# Patient Record
Sex: Male | Born: 1935
Health system: Southern US, Community
[De-identification: ages and names within clinical notes are randomized; demographics above are authoritative.]

## PROBLEM LIST (undated history)

## (undated) DIAGNOSIS — M542 Cervicalgia: Secondary | ICD-10-CM

## (undated) DIAGNOSIS — E7219 Other disorders of sulfur-bearing amino-acid metabolism: Secondary | ICD-10-CM

## (undated) DIAGNOSIS — R9431 Abnormal electrocardiogram [ECG] [EKG]: Secondary | ICD-10-CM

## (undated) DIAGNOSIS — I714 Abdominal aortic aneurysm, without rupture, unspecified: Secondary | ICD-10-CM

## (undated) DIAGNOSIS — M419 Scoliosis, unspecified: Secondary | ICD-10-CM

## (undated) DIAGNOSIS — D126 Benign neoplasm of colon, unspecified: Secondary | ICD-10-CM

## (undated) DIAGNOSIS — H8093 Unspecified otosclerosis, bilateral: Secondary | ICD-10-CM

## (undated) DIAGNOSIS — N529 Male erectile dysfunction, unspecified: Secondary | ICD-10-CM

## (undated) DIAGNOSIS — E785 Hyperlipidemia, unspecified: Secondary | ICD-10-CM

## (undated) DIAGNOSIS — IMO0002 Reserved for concepts with insufficient information to code with codable children: Secondary | ICD-10-CM

## (undated) DIAGNOSIS — K648 Other hemorrhoids: Secondary | ICD-10-CM

## (undated) DIAGNOSIS — M722 Plantar fascial fibromatosis: Secondary | ICD-10-CM

## (undated) DIAGNOSIS — M353 Polymyalgia rheumatica: Secondary | ICD-10-CM

## (undated) DIAGNOSIS — G5600 Carpal tunnel syndrome, unspecified upper limb: Secondary | ICD-10-CM

## (undated) DIAGNOSIS — M25519 Pain in unspecified shoulder: Secondary | ICD-10-CM

## (undated) DIAGNOSIS — M75102 Unspecified rotator cuff tear or rupture of left shoulder, not specified as traumatic: Secondary | ICD-10-CM

## (undated) DIAGNOSIS — J309 Allergic rhinitis, unspecified: Secondary | ICD-10-CM

## (undated) DIAGNOSIS — R972 Elevated prostate specific antigen [PSA]: Secondary | ICD-10-CM

## (undated) DIAGNOSIS — I739 Peripheral vascular disease, unspecified: Secondary | ICD-10-CM

## (undated) DIAGNOSIS — T7840XA Allergy, unspecified, initial encounter: Secondary | ICD-10-CM

## (undated) DIAGNOSIS — H9190 Unspecified hearing loss, unspecified ear: Secondary | ICD-10-CM

## (undated) DIAGNOSIS — H269 Unspecified cataract: Secondary | ICD-10-CM

## (undated) HISTORY — DX: Plantar fascial fibromatosis: M72.2

## (undated) HISTORY — DX: Other disorders of sulfur-bearing amino-acid metabolism: E72.19

## (undated) HISTORY — PX: COLONOSCOPY: SHX174

## (undated) HISTORY — DX: Abdominal aortic aneurysm, without rupture: I71.4

## (undated) HISTORY — PX: COLONOSCOPY W/ POLYPECTOMY: SHX1380

## (undated) HISTORY — DX: Peripheral vascular disease, unspecified: I73.9

## (undated) HISTORY — DX: Elevated prostate specific antigen (PSA): R97.20

## (undated) HISTORY — DX: Pain in unspecified shoulder: M25.519

## (undated) HISTORY — DX: Abdominal aortic aneurysm, without rupture, unspecified: I71.40

## (undated) HISTORY — DX: Unspecified otosclerosis, bilateral: H80.93

## (undated) HISTORY — DX: Allergy, unspecified, initial encounter: T78.40XA

## (undated) HISTORY — DX: Reserved for concepts with insufficient information to code with codable children: IMO0002

## (undated) HISTORY — DX: Abnormal electrocardiogram (ECG) (EKG): R94.31

## (undated) HISTORY — DX: Cervicalgia: M54.2

## (undated) HISTORY — DX: Carpal tunnel syndrome, unspecified upper limb: G56.00

## (undated) HISTORY — DX: Male erectile dysfunction, unspecified: N52.9

## (undated) HISTORY — DX: Scoliosis, unspecified: M41.9

## (undated) HISTORY — DX: Unspecified cataract: H26.9

## (undated) HISTORY — DX: Hyperlipidemia, unspecified: E78.5

## (undated) HISTORY — PX: STAPEDECTOMY: SHX2435

## (undated) HISTORY — DX: Unspecified rotator cuff tear or rupture of left shoulder, not specified as traumatic: M75.102

## (undated) HISTORY — DX: Unspecified hearing loss, unspecified ear: H91.90

## (undated) HISTORY — DX: Polymyalgia rheumatica: M35.3

## (undated) HISTORY — DX: Allergic rhinitis, unspecified: J30.9

## (undated) HISTORY — DX: Benign neoplasm of colon, unspecified: D12.6

## (undated) HISTORY — DX: Other hemorrhoids: K64.8

---

## 1999-11-29 HISTORY — PX: OTHER SURGICAL HISTORY: SHX169

## 1999-12-04 ENCOUNTER — Ambulatory Visit (HOSPITAL_COMMUNITY): Admission: RE | Admit: 1999-12-04 | Discharge: 1999-12-04 | Payer: Self-pay | Admitting: *Deleted

## 1999-12-05 ENCOUNTER — Encounter: Payer: Self-pay | Admitting: *Deleted

## 2001-01-26 ENCOUNTER — Observation Stay (HOSPITAL_COMMUNITY): Admission: RE | Admit: 2001-01-26 | Discharge: 2001-01-27 | Payer: Self-pay | Admitting: Urology

## 2002-04-30 ENCOUNTER — Encounter: Payer: Self-pay | Admitting: Emergency Medicine

## 2002-04-30 ENCOUNTER — Emergency Department (HOSPITAL_COMMUNITY): Admission: EM | Admit: 2002-04-30 | Discharge: 2002-04-30 | Payer: Self-pay | Admitting: Emergency Medicine

## 2003-01-06 ENCOUNTER — Encounter: Payer: Self-pay | Admitting: Emergency Medicine

## 2003-01-06 ENCOUNTER — Inpatient Hospital Stay (HOSPITAL_COMMUNITY): Admission: EM | Admit: 2003-01-06 | Discharge: 2003-01-07 | Payer: Self-pay | Admitting: Emergency Medicine

## 2003-01-07 ENCOUNTER — Encounter (INDEPENDENT_AMBULATORY_CARE_PROVIDER_SITE_OTHER): Payer: Self-pay | Admitting: Cardiology

## 2003-01-14 ENCOUNTER — Encounter (INDEPENDENT_AMBULATORY_CARE_PROVIDER_SITE_OTHER): Payer: Self-pay | Admitting: *Deleted

## 2003-01-14 ENCOUNTER — Encounter (INDEPENDENT_AMBULATORY_CARE_PROVIDER_SITE_OTHER): Payer: Self-pay | Admitting: Specialist

## 2003-01-14 ENCOUNTER — Ambulatory Visit (HOSPITAL_COMMUNITY): Admission: RE | Admit: 2003-01-14 | Discharge: 2003-01-14 | Payer: Self-pay | Admitting: *Deleted

## 2003-03-27 ENCOUNTER — Encounter (INDEPENDENT_AMBULATORY_CARE_PROVIDER_SITE_OTHER): Payer: Self-pay | Admitting: *Deleted

## 2003-03-27 ENCOUNTER — Ambulatory Visit (HOSPITAL_COMMUNITY): Admission: RE | Admit: 2003-03-27 | Discharge: 2003-03-27 | Payer: Self-pay | Admitting: *Deleted

## 2003-10-28 ENCOUNTER — Encounter (INDEPENDENT_AMBULATORY_CARE_PROVIDER_SITE_OTHER): Payer: Self-pay | Admitting: *Deleted

## 2003-10-28 ENCOUNTER — Encounter (INDEPENDENT_AMBULATORY_CARE_PROVIDER_SITE_OTHER): Payer: Self-pay | Admitting: Specialist

## 2003-10-28 ENCOUNTER — Ambulatory Visit (HOSPITAL_COMMUNITY): Admission: RE | Admit: 2003-10-28 | Discharge: 2003-10-28 | Payer: Self-pay | Admitting: *Deleted

## 2003-12-30 HISTORY — PX: OTHER SURGICAL HISTORY: SHX169

## 2004-01-08 ENCOUNTER — Encounter (INDEPENDENT_AMBULATORY_CARE_PROVIDER_SITE_OTHER): Payer: Self-pay | Admitting: *Deleted

## 2004-01-08 ENCOUNTER — Encounter (INDEPENDENT_AMBULATORY_CARE_PROVIDER_SITE_OTHER): Payer: Self-pay | Admitting: Specialist

## 2004-01-08 ENCOUNTER — Ambulatory Visit (HOSPITAL_COMMUNITY): Admission: RE | Admit: 2004-01-08 | Discharge: 2004-01-08 | Payer: Self-pay | Admitting: *Deleted

## 2004-01-26 ENCOUNTER — Encounter (INDEPENDENT_AMBULATORY_CARE_PROVIDER_SITE_OTHER): Payer: Self-pay | Admitting: Specialist

## 2004-01-26 ENCOUNTER — Inpatient Hospital Stay (HOSPITAL_COMMUNITY): Admission: RE | Admit: 2004-01-26 | Discharge: 2004-01-28 | Payer: Self-pay | Admitting: General Surgery

## 2004-01-26 ENCOUNTER — Encounter (INDEPENDENT_AMBULATORY_CARE_PROVIDER_SITE_OTHER): Payer: Self-pay | Admitting: *Deleted

## 2006-01-24 ENCOUNTER — Ambulatory Visit (HOSPITAL_COMMUNITY): Admission: RE | Admit: 2006-01-24 | Discharge: 2006-01-24 | Payer: Self-pay | Admitting: *Deleted

## 2006-01-24 ENCOUNTER — Encounter (INDEPENDENT_AMBULATORY_CARE_PROVIDER_SITE_OTHER): Payer: Self-pay | Admitting: *Deleted

## 2009-11-30 ENCOUNTER — Ambulatory Visit: Payer: Self-pay | Admitting: Vascular Surgery

## 2010-03-16 ENCOUNTER — Ambulatory Visit: Payer: Self-pay | Admitting: Internal Medicine

## 2010-03-16 DIAGNOSIS — Z860100 Personal history of colon polyps, unspecified: Secondary | ICD-10-CM | POA: Insufficient documentation

## 2010-04-13 ENCOUNTER — Telehealth (INDEPENDENT_AMBULATORY_CARE_PROVIDER_SITE_OTHER): Payer: Self-pay | Admitting: *Deleted

## 2010-04-14 ENCOUNTER — Ambulatory Visit: Payer: Self-pay | Admitting: Internal Medicine

## 2010-04-14 DIAGNOSIS — D126 Benign neoplasm of colon, unspecified: Secondary | ICD-10-CM

## 2010-04-14 HISTORY — DX: Benign neoplasm of colon, unspecified: D12.6

## 2010-04-14 LAB — HM COLONOSCOPY

## 2010-04-16 ENCOUNTER — Encounter: Payer: Self-pay | Admitting: Internal Medicine

## 2010-06-21 HISTORY — PX: OTHER SURGICAL HISTORY: SHX169

## 2010-06-29 ENCOUNTER — Encounter: Payer: Self-pay | Admitting: Internal Medicine

## 2010-12-07 ENCOUNTER — Ambulatory Visit: Admit: 2010-12-07 | Payer: Self-pay | Admitting: Vascular Surgery

## 2010-12-07 ENCOUNTER — Ambulatory Visit
Admission: RE | Admit: 2010-12-07 | Discharge: 2010-12-07 | Payer: Self-pay | Source: Home / Self Care | Attending: Vascular Surgery | Admitting: Vascular Surgery

## 2010-12-28 NOTE — Op Note (Signed)
Summary: Colonoscopy-Dr. Virginia Rochester  NAME:  [name], [name]                          ACCOUNT NO.:  [id number]   MEDICAL RECORD NO.:  [id number]                   PATIENT TYPE:  AMB   LOCATION:  ENDO                                 FACILITY:  [facility]   PHYSICIAN:  Georgiana Spinner, M.D.                 DATE OF BIRTH:  05/06/1930   DATE OF PROCEDURE:  01/08/2004  DATE OF DISCHARGE:                                 OPERATIVE REPORT   PROCEDURE PERFORMED:  Colonoscopy with biopsy.   ENDOSCOPIST:  Georgiana Spinner, M.D.   INDICATIONS FOR PROCEDURE:  Colon polyp, rectal bleed.   ANESTHESIA:  Demerol 70 mg, Versed 7 mg.   DESCRIPTION OF PROCEDURE:  With the patient mildly sedated in the left  lateral decubitus position, the Olympus videoscopic colonoscope was inserted  in the rectum and passed under direct vision to the cecum, identified by the  ileocecal valve and appendiceal orifice, both of which were photographed.  It was seen that there was polypoid tissue that had recurred once again at  the area of the appendix and this was photographed and biopsied.  The  colonoscope was slowly withdrawn taking circumferential views of the entire  colonic mucosa stopping only in the rectum which appeared normal on direct  and showed hemorrhoids on retroflex view.  The endoscope was straightened  and withdrawn.  The patient's vital signs and pulse oximeter remained  stable.  The patient tolerated the procedure well without apparent  complications.   FINDINGS:  Polyp of the appendiceal area, biopsied.   PLAN:  Since we have tried to eradicate this twice, I think really we need  to do some different form of therapy such as cecectomy and will discuss with  surgery and see if this could be done easily laparoscopically and get good  margins.                                               Georgiana Spinner, M.D.    GMO/MEDQ  D:  01/08/2004  T:  01/08/2004  Job:  [id number]   cc:   Lenon Curt. Chilton Si, M.D.  [address]  [place]  [place] [address]  Fax: [phone]

## 2010-12-28 NOTE — Op Note (Signed)
Summary: Colonoscopy-Dr. Virginia Rochester   NAME:  [name], [name]                          ACCOUNT NO.:  [id number]   MEDICAL RECORD NO.:  [id number]                   PATIENT TYPE:  INP   LOCATION:  0005                                 FACILITY:  [facility]   PHYSICIAN:  Angelia Mould. Derrell Lolling, M.D.             DATE OF BIRTH:  1930/10/17   DATE OF PROCEDURE:  01/26/2004  DATE OF DISCHARGE:                                 OPERATIVE REPORT   PREOPERATIVE DIAGNOSIS:  Recurrent villous adenoma of the cecum.   POSTOPERATIVE DIAGNOSIS:  Recurrent villous adenoma of the cecum.   OPERATION/PROCEDURE:  Laparoscopic resection of the appendix and cecal tip.   SURGEON:  Angelia Mould. Derrell Lolling, M.D.   FIRST ASSISTANT:  Jimmye Norman, M.D.   OPERATIVE INDICATIONS:  This is a 77 year old white male who has been  followed by Dr. Sabino Gasser for about two years.  He has had several  colonoscopies and at each colonoscopy, he has described a carpet-like polyp  closely associated with the appendiceal orifice.  Biopsies have always  showed adenomatous polyps but no dysplasia.  This has been ablated using the  argon photocoagulator.  The most recent colonoscopy showed the polypoid  tissue had recurred.  Dr. Virginia Rochester referred him to me for consideration of a  local resection to prevent transformation of this polyp into cancer.  The  patient has undergone a bowel prep at home and is brought to the hospital  electively.   OPERATIVE FINDINGS:  Laparoscopic survey of the liver, stomach, omentum,  small and large intestines were grossly normal.  The peritoneal surfaces  looked normal.  There was no ascites.  The appendix looked normal.  After  resecting the appendix and about a 2.5 cm rim of cecum, the specimen was  opened.  At the base of the appendix, there was some discolored tissue,  consistent with the recurrent polyp, but there was no palpable mass or  obvious cancer.  The margins looked excellent.   OPERATIVE TECHNIQUE:  Following  the induction of general endotracheal  anesthesia, a Foley catheter and orogastric tube were placed.  The patient's  abdomen was prepped and draped in a sterile fashion.  Marcaine 0.5% was used  as a local infiltrate and anesthetic.  An 11 mm Optiview port was inserted  in the left mid abdomen and the lateral aspect of the rectus muscle just  above the umbilicus.  A 0 degree camera was used as a guide to traverse the  abdominal wall and the infra-abdominal cavity without any difficulty.  The  abdomen was insufflated to 15 mmHg.  The video camera was inserted.  We  surveyed the abdomen and pelvis and found no bleeding or injury.  Two 5 mm  trocars were placed, one in the suprapubic area and one in the left lower  quadrant.  We ultimately placed a 12 mm trocar in the right upper quadrant  in the subcostal area.  We identified the terminal ileum and the cecum.  Lateral peritoneal attachments were carefully divided using the harmonic  scalpel.  We dissected the appendix and the cecum up and out of its bed.  We  were very careful to identify and preserve the iliac vessels, the testicular  vessels, and the ureter, all of which were preserved and intact at the  completion of the case.  We mobilized the entire cecum and some of the  ascending colon.  We divided the appendiceal mesentery and appendiceal  artery using the harmonic scalpel.  After all this was done, we had the  distal half of the right colon, terminal ileum, and appendix quite mobile to  where we could suspend them anteriorly.  We inserted an endoscopic GIA  stapling device with a 3.5 mm staple height and a 60 mm long load.  This was  inserted from the right upper quadrant, and we positioned this across the  cecum to resect as much of the cecal tip as we could without compromising  the ileocecal valve.  We could see the terminal ileum in the area of the  ileocecal valve quite nicely, and we placed a stapler above this.  The  stapler  was then fired and removed.  The staple line looked good and was  hemostatic.  We placed Tisseel on the staple line.  Photographs were taken.  The specimen was placed in a specimen bag and removed.   I cut open the specimen on the side table using scissors chiefly to remove  the staples and opened up what appeared to be a about a half-dollar button  of cecum.  The margin looked excellent.  There was a little bit of  discolored tissue at the center of the resection down at the base of the  appendix.  I sent the specimen to Dr. Jimmy Picket, and he agreed that there  was no obvious tumor or cancer and that the margins of the resection looked  good.   I inspected the area after the Tisseel dried.  I irrigated, but there was  really almost no blood loss whatsoever.  The cecum and terminal ileum looked  fine.  The iliac vessels and the right ureter looked fine.  The trocars were  removed under direct vision.  There was no bleeding at the trocar sites.  The pneumoperitoneum was released.  The skin incisions were closed with  subcuticular sutures of 4-0 Vicryl and Steri-Strips.  Clean bandages were  placed, and the patient was taken to the recovery room in stable condition.   ESTIMATED BLOOD LOSS:  About 10 cc.   COMPLICATIONS:  None.   SPONGE, NEEDLE, INSTRUMENT COUNTS:  Correct.                                               Angelia Mould. Derrell Lolling, M.D.    HMI/MEDQ  D:  01/26/2004  T:  01/26/2004  Job:  [id number]   cc:   Lenon Curt. Chilton Si, M.D.  [address]  [place]  [place] [address]  Fax: [phone]   Georgiana Spinner, M.D.  [address]  [place]  [place] [address]  Fax: [phone]

## 2010-12-28 NOTE — Procedures (Signed)
Summary: Colonoscopy  Patient: [name] Note: All result statuses are Final unless otherwise noted.  Tests: (1) Colonoscopy (COL)   COL Colonoscopy           DONE     [facility]     [address]     [place], [place]  [address]           COLONOSCOPY PROCEDURE REPORT           PATIENT:  [name], [name]  MR#:  [id number]     BIRTHDATE:  [birth year], 79 yrs. old  GENDER:  male     ENDOSCOPIST:  Wilhemina Bonito. Eda Keys, MD     REF. BY:  Murray Hodgkins, M.D.     PROCEDURE DATE:  04/14/2010     PROCEDURE:  Colonoscopy with snare polypectomy x 3     ASA CLASS:  Class II     INDICATIONS:  history of pre-cancerous (adenomatous) colon polyps     - Large villous adenoma of the cecum resected surgically 12-2003;     Last follow up 12-2005 was negative     MEDICATIONS:   Fentanyl 75 mcg IV, Versed 8 mg IV           DESCRIPTION OF PROCEDURE:   After the risks benefits and     alternatives of the procedure were thoroughly explained, informed     consent was obtained.  Digital rectal exam was performed and     revealed no abnormalities.   The LB CF-H180AL [id number] endoscope     was introduced through the anus and advanced to the cecum, which     was identified by  the  ileocecal valve and cecal reminant,     without limitations.The ileum was intubated.Time to cecum = 7:55     min.  The quality of the prep was good, using MoviPrep.  The     instrument was then slowly withdrawn (time = 16:49 min)as the     colon was fully examined.     <<PROCEDUREIMAGES>>           FINDINGS:  Three polyps were found in the cecum (28mm,4mm) and     sigmoid colon (3mm). Polyps were snared without cautery. Retrieval     was successful.   This was otherwise a normal examination of the     colon.   Retroflexed views in the rectum revealed internal     hemorrhoids.    The scope was then withdrawn from the patient and     the procedure completed.           COMPLICATIONS:  None     ENDOSCOPIC IMPRESSION:     1)  Three polyps - removed     2) Otherwise normal examination     3) Internal hemorrhoids           RECOMMENDATIONS:     1) Consider Follow up colonoscopy in 5 years if medically fit     and willing           ______________________________     Wilhemina Bonito. Eda Keys, MD           CC:  Murray Hodgkins, MD; The Patient           n.     eSIGNED:   Wilhemina Bonito. Eda Keys at 04/14/2010 03:34 PM           [name], [id number]  Note: An exclamation mark (!) indicates a result  that was not dispersed into the flowsheet. Document Creation Date: 04/15/2010 5:07 PM _______________________________________________________________________  (1) Order result status: Final Collection or observation date-time: 04/14/2010 15:24 Requested date-time:  Receipt date-time:  Reported date-time:  Referring Physician:   Ordering Physician: Fransico Setters [id number]) Specimen Source:  Source: Launa Grill Order Number: [id number] Lab site:   Appended Document: Colonoscopy recall     Procedures Next Due Date:    Colonoscopy: 03/2015

## 2010-12-28 NOTE — Progress Notes (Signed)
Summary: prep ?'s  Phone Note Call from Patient Call back at Work Phone [phone]   Caller: Patient Call For: Dr. Marina Goodell Reason for Call: Talk to Nurse Summary of Call: prep ?'s Initial call taken by: Vallarie Mare,  Apr 13, 2010 9:27 AM  Follow-up for Phone Call        Answered pt's questions about clear liquid diet. Follow-up by: Ezra Sites RN,  Apr 13, 2010 9:35 AM

## 2010-12-28 NOTE — Op Note (Signed)
Summary: Colonoscopy-Dr. Virginia Rochester  NAME:  [name], [name]                          ACCOUNT NO.:  [id number]   MEDICAL RECORD NO.:  [id number]                   PATIENT TYPE:  AMB   LOCATION:  ENDO                                 FACILITY:  [facility]   PHYSICIAN:  Georgiana Spinner, M.D.                 DATE OF BIRTH:  1930/07/13   DATE OF PROCEDURE:  DATE OF DISCHARGE:                                 OPERATIVE REPORT   PROCEDURE:  Colonoscopy with biopsy.   INDICATIONS:  Rectal bleeding, known colon polyp seen previously.   ANESTHESIA:  Fentanyl 75 mcg, Versed 7.5 mg.   PROCEDURE:  With the patient mildly sedated in the left lateral decubitus  position, a rectal exam was performed which was unremarkable.  Subsequently,  the Olympus videoscopic colonoscope was inserted in the rectum and passed  easily under direct vision into the cecum with pressure applied to the  abdomen.  The cecum was identified via the ileocecal valve and the  appendiceal orifice.  In the area of the appendiceal orifice, the previously  noted area of velvety changes were once again felt to be seen.  Photographs  were taken and after deliberation, felt that we probably should go ahead and  biopsy it and then Erbe eradicate this which is what we proceeded to do.  Biopsies were taken with cold biopsy forceps.  Subsequently, the [employer] set on a 20/200 setting was used to eradicate this area.  At  the termination of this, we felt we had eradicated the entire area,  photograph taken. The endoscope was then withdrawn, taking circumferential  views of the remaining colonic mucosa.  The prep was slightly suboptimal.  As we pulled back all the way to the rectum which appeared normal on direct  and retroflexed view.  The endoscope was straightened and withdrawn.  The  patient's vital signs and pulse oximetry remained stable.  The patient  tolerated the procedure well with no apparent complications.   FINDINGS:   Question of recurrent polyp in the cecum at the appendiceal  orifice and this was eradicated after biopsy using Erbe Argon  photocoagulator.   PLAN:  Await biopsy report.  I will have the patient call me for results and  follow up with me as an outpatient to determine further course of action.  Additionally, will have the patient avoid aspirin for 14 days and be placed  on a low residue diet for seven days.                                               Georgiana Spinner, M.D.    GMO/MEDQ  D:  10/28/2003  T:  10/28/2003  Job:  [id number]   cc:   Lenon Curt. Chilton Si, M.D.  [address]  [name]  [place] [address]  Fax: [phone]

## 2010-12-28 NOTE — Op Note (Signed)
Summary: Colonoscopy-Dr. Virginia Rochester    NAME:  [name], [name] NO.:  [id number]   MEDICAL RECORD NO.:  [id number]                   PATIENT TYPE:  AMB   LOCATION:  ENDO                                 FACILITY:  [facility]   PHYSICIAN:  Georgiana Spinner, M.D.                 DATE OF BIRTH:  [birth year]   DATE OF PROCEDURE:  01/14/2003  DATE OF DISCHARGE:                                 OPERATIVE REPORT   PROCEDURE:  Colonoscopy.   INDICATIONS:  Colon cancer screening, colon polyp.   ANESTHESIA:  Demerol 50 mg, Versed 5 mg.   DESCRIPTION OF PROCEDURE:  With the patient mildly sedated in the left  lateral decubitus position, the Olympus videoscopic colonoscope was inserted  into the rectum after a normal rectal exam and passed under direct vision to  the cecum.  Cecum identified by the crow's foot of the cecum and the  ileocecal valve, both of which were photographed.  In the cecum there was  some villous-appearing material which could have been the normal mucosa of  this area, but I could not get full insufflation so therefore I took some  cold biopsies of the area.  From this point the colonoscope was then slowly  withdrawn, taking circumferential views of the entire colonic mucosa,  stopping only in the rectum, which appeared normal on direct view other than  a small polyp that was seen and removed using hot biopsy forceps technique,  setting of 20-20 blended current, and also on retroflexed view.  The  endoscope was straightened and withdrawn.  The patient's vital signs and  pulse oximetry remained stable.  The patient tolerated the procedure well  without apparent complications.   FINDINGS:  Question of polyp in cecum, which may have been normal variant,  and also a small polyp in the rectal area.   PLAN:  Await biopsy report.  The patient will call me for results and follow  up with me as an outpatient.                                               Georgiana Spinner, M.D.    GMO/MEDQ  D:  01/14/2003  T:  01/14/2003  Job:  [id number]   cc:   Lenon Curt. Chilton Si, M.D.  [address]  [place]  [place] [address]  Fax: [phone]

## 2010-12-28 NOTE — Op Note (Signed)
Summary: Colonoscopy-Dr. Virginia Rochester    NAME:  [name], [name]                          ACCOUNT NO.:  [id number]   MEDICAL RECORD NO.:  [id number]                   PATIENT TYPE:  AMB   LOCATION:  ENDO                                 FACILITY:  [facility]   PHYSICIAN:  Georgiana Spinner, M.D.                 DATE OF BIRTH:  1930/09/27   DATE OF PROCEDURE:  03/27/2003  DATE OF DISCHARGE:                                 OPERATIVE REPORT   PROCEDURE PERFORMED:  Colonoscopy with eradication of polyp.   ENDOSCOPIST:  Georgiana Spinner, M.D.   ANESTHESIA:  Demerol 80 mg, Versed 10 mg.   DESCRIPTION OF PROCEDURE:  With the patient mildly sedated in the left  lateral decubitus position, the Olympus video colonoscope was inserted in  the rectum after a normal rectal exam and passed under direct vision to the  cecum, identified by the ileocecal valve and appendiceal orifice, the latter  of which was photographed.  In the cecum, we found once again the velvet-  like, carpet-like polyp that had been previously biopsied and proven to be  an adenomatous polyp.  It was rather large and using Erbe argon  photocoagulation, we eradicated this polyp in its entirety using a setting  of 20/200.  Once I was satisfied that all the adenomatous appearing tissue  had been eradicated, photographs were taken and the colonoscope was then  withdrawn taking circumferential views of the remaining colonic mucosa  stopping only in the rectum which appeared normal on direct and showed  hemorrhoids on retroflex view.  The endoscope was straightened and  withdrawn.  The patient's vital signs and pulse oximeter remained stable.  The patient tolerated the procedure well without apparent complications.   FINDINGS:  Cecal polyp eradicated using argon photocoagulation.   PLAN:  Await clinical response.  Because of the extent of this polyp, we  will plan on a repeat examination possibly in six months to make sure that  we have had full  eradication and no recurrence at this site.                                                 Georgiana Spinner, M.D.    GMO/MEDQ  D:  03/27/2003  T:  03/27/2003  Job:  [id number]   cc:   Lenon Curt. Chilton Si, M.D.  [address]  [place]  [place] [address]  Fax: [phone]

## 2010-12-28 NOTE — Op Note (Signed)
Summary: Colonoscopy-Dr. Virginia Rochester  [name], [name]                ACCOUNT NO.:  [id number]   MEDICAL RECORD NO.:  [id number]          PATIENT TYPE:  AMB   LOCATION:  ENDO                         FACILITY:  [facility]   PHYSICIAN:  Georgiana Spinner, M.D.    DATE OF BIRTH:  [birth year]   DATE OF PROCEDURE:  01/24/2006  DATE OF DISCHARGE:                                 OPERATIVE REPORT   PROCEDURE:  Colonoscopy.   INDICATIONS:  Follow up of colon polyps.   ANESTHESIA:  Demerol 40 milligrams, Versed 4 milligrams.   PROCEDURE:  With the patient mildly sedated in the left lateral decubitus  position, a rectal examination was performed which was unremarkable.  Subsequently the Olympus videoscopic colonoscope was inserted into the  rectum and passed under direct vision to the cecum identified by crow's foot  of the cecum and ileocecal valve, both of which were photographed.  From  this point the colonoscope was slowly withdrawn taking circumferential views  of colonic mucosa stopping only in the rectum which appeared normal on  direct and showed hemorrhoidal tissue on retroflexed view.  The endoscope  was straightened, withdrawn.  The patient's vital signs, pulse oximeter  remained stable. The patient tolerated procedure well without apparent  complications.   FINDINGS:  Internal hemorrhoids otherwise unremarkable colonoscopic  examination to the cecum.   PLAN:  Consider repeat examination possibly in 5 years           ______________________________  Georgiana Spinner, M.D.     GMO/MEDQ  D:  01/24/2006  T:  01/24/2006  Job:  [id number]   cc:   Lenon Curt. Chilton Si, M.D.  Fax: [phone]   Angelia Mould. Derrell Lolling, M.D.  [address], [address]  [place]  [place] [address]

## 2010-12-28 NOTE — Letter (Signed)
Summary: [facility]   Imported By: Sherian Rein 07/22/2010 10:34:51  _____________________________________________________________________  External Attachment:    Type:   Image     Comment:   External Document

## 2010-12-28 NOTE — Assessment & Plan Note (Signed)
Summary: ESTABLISH CARE-FORMER PT OF DR ORR   History of Present Illness Visit Type: Initial Consult Primary GI MD: Yancey Flemings MD Primary Provider: Buford Dresser Requesting Provider: Buford Dresser Chief Complaint: Establish care with Dr Marina Goodell, Former Dr Virginia Rochester patient, consult for colonoscopy History of Present Illness:   77 year old white male, former Collene Schlichter of [place], with a history of polymyalgia rheumatica and persistent villous adenoma of the cecum requiring surgical resection February 2005. The patient initially underwent colonoscopy in February 2004. He apparently had a lesion of the cecum that was adenomatous and persisted on followup colonoscopy in November 2004 and again in February 2005. For this he underwent laparoscopic resection of the appendix the cecal tip by Dr. Claud Kelp January 26, 2004. Postoperative followup colonoscopy February 2007 was remarkable for hemorrhoids only. Patient presents now regarding followup surveillance for which she states she is due. No lower GI complaints. Chronic medical problems stable. Wife Darl Pikes a patient of mine. Most recent colonoscopy report and prior pathology reviewed.   GI Review of Systems      Denies abdominal pain, acid reflux, belching, bloating, chest pain, dysphagia with liquids, dysphagia with solids, heartburn, loss of appetite, nausea, vomiting, vomiting blood, weight loss, and  weight gain.        Denies anal fissure, black tarry stools, change in bowel habit, constipation, diarrhea, diverticulosis, fecal incontinence, heme positive stool, hemorrhoids, irritable bowel syndrome, jaundice, light color stool, liver problems, rectal bleeding, and  rectal pain. Preventive Screening-Counseling & Management      Drug Use:  no.      Current Medications (verified): 1)  Multivitamins  Tabs (Multiple Vitamin) .Marland Kitchen.. 1 Tablet By Mouth Once Daily 2)  Vitamin D3 1000 Unit Tabs (Cholecalciferol) .Marland Kitchen.. 1 Tablet By Mouth Once Daily 3)   Prednisone 5 Mg Tabs (Prednisone) .Marland Kitchen.. 1 Tablet By Mouth Once Daily 4)  Fish Oil 1000 Mg Caps (Omega-3 Fatty Acids) .Marland Kitchen.. 1 Capsule By Mouth Once Daily 5)  Ambrotose .Marland Kitchen.. 2 Tablets By Mouth Two Times A Day  Allergies (verified): 1)  ! Codeine  Past History:  Past Medical History: Last updated: 03/15/2010 Herpes Zoster Colon Polyps CTS Otosclerosis Deaf AAA PVD DDD Rotator Cuff Syndrome Scoliosis Hemorrhoids Hyperlipidemia  Past Surgical History: Last updated: 03/15/2010 Stapedectomy Implant of penile pump Laparoscopic resection of appendix and tip of rectum  Family History:  No FH of Colon Cancer:  Social History: Married Occupation: Retired Theatre manager Alcohol Use - no Patient has never smoked.  Daily Caffeine Use  Tea Illicit Drug Use - no Drug Use:  no  Review of Systems  The patient denies allergy/sinus, anemia, anxiety-new, arthritis/joint pain, back pain, blood in urine, breast changes/lumps, change in vision, confusion, cough, coughing up blood, depression-new, fainting, fatigue, fever, headaches-new, hearing problems, heart murmur, heart rhythm changes, itching, muscle pains/cramps, night sweats, nosebleeds, shortness of breath, skin rash, sleeping problems, sore throat, swelling of feet/legs, swollen lymph glands, thirst - excessive, urination - excessive, urination changes/pain, urine leakage, vision changes, and voice change.    Vital Signs:  Patient profile:   77 year old male Height:      72 inches Weight:      187 pounds BMI:     25.45 BSA:     2.07 Pulse rate:   88 / minute Pulse rhythm:   regular BP sitting:   104 / 64  (left arm)  Vitals Entered By: Merri Ray CMA Duncan Dull) (March 16, 2010 1:51 PM)  Physical Exam  General:  Well developed, well nourished, no acute distress. Head:  Normocephalic and atraumatic. Eyes:  PERRLA, no icterus. Nose:  No deformity, discharge,  or lesions. Mouth:  No deformity or lesions Lungs:   Clear throughout to auscultation. Heart:  Regular rate and rhythm; no murmurs, rubs,  or bruits. Abdomen:  Soft, nontender and nondistended. No masses, hepatosplenomegaly or hernias noted. Normal bowel sounds. Rectal:  deferred until colonoscopy Msk:  no deformities Pulses:  Normal pulses noted. Extremities:  no edema Neurologic:  Alert and  oriented x4;   Skin:  no jaundice Psych:  Alert and cooperative. Normal mood and affect.   Impression & Recommendations:  Problem # 1:  COLONIC POLYPS, HX OF (ICD-V12.72) history of persistent villous adenoma of the cecum requiring surgical resection. Presents now for surveillance colonoscopy. He is an appropriate candidate without contraindication. The nature of the procedure as well as the risks, benefits, and alternatives were reviewed. He understood and agreed to proceed. Movi prep prescribed. The patient instructed on its use.  Other Orders: Colonoscopy (Colon)  Patient Instructions: 1)  Colonoscopy [facility] 04/14/10 2:30 pm arrive at 1:30 pm 2)  Movi prep instructions given to patient. 3)  Movi prep Rx. sent to pharmacy. 4)  Colonoscopy and Flexible Sigmoidoscopy brochure given.  5)  Copy sent to : Murray Hodgkins, MD 6)  The medication list was reviewed and reconciled.  All changed / newly prescribed medications were explained.  A complete medication list was provided to the patient / caregiver. 7)  printed and given to pt. Milford Cage [facility]  March 16, 2010 2:45 PM Prescriptions: MOVIPREP 100 GM  SOLR (PEG-KCL-NACL-NASULF-NA ASC-C) As per prep instructions.  #1 x 0   Entered by:   Milford Cage NCMA   Authorized by:   Hilarie Fredrickson MD   Signed by:   Milford Cage NCMA on 03/16/2010   Method used:   Electronically to        [facility] (retail)       [address]       [place], [place]  [id number]       Ph: [phone] or [phone]       Fax: [phone]   RxID:   [id number]

## 2010-12-28 NOTE — Letter (Signed)
Summary: [facility] Instructions  [facility]  [address] [place], [place] [address]   Phone: [phone]  Fax: [phone]       Adriana Labombard    Jun 30, 1930    MRN: [id number]        Procedure Day /Date:WEDNESDAY, 04/14/10     Arrival Time:1:30 PM       Procedure Time:2:30 PM     Location of Procedure:                    [facility] ([facility]th [facility])                        PREPARATION FOR COLONOSCOPY WITH MOVIPREP   Starting 5 days prior to your procedure 5/13/11do not eat nuts, seeds, popcorn, corn, beans, peas,  salads, or any raw vegetables.  Do not take any fiber supplements (e.g. Metamucil, Citrucel, and Benefiber).  THE DAY BEFORE YOUR PROCEDURE         DATE: 04/13/10 DAY: TUESDAY  1.  Drink clear liquids the entire day-NO SOLID FOOD  2.  Do not drink anything colored red or purple.  Avoid juices with pulp.  No orange juice.  3.  Drink at least 64 oz. (8 glasses) of fluid/clear liquids during the day to prevent dehydration and help the prep work efficiently.  CLEAR LIQUIDS INCLUDE: Water Jello Ice Popsicles Tea (sugar ok, no milk/cream) Powdered fruit flavored drinks Coffee (sugar ok, no milk/cream) Gatorade Juice: apple, white grape, white cranberry  Lemonade Clear bullion, consomm, broth Carbonated beverages (any kind) Strained chicken noodle soup Hard Candy                             4.  In the morning, mix first dose of MoviPrep solution:    Empty 1 Pouch A and 1 Pouch B into the disposable container    Add lukewarm drinking water to the top line of the container. Mix to dissolve    Refrigerate (mixed solution should be used within 24 hrs)  5.  Begin drinking the prep at 5:00 p.m. The MoviPrep container is divided by 4 marks.   Every 15 minutes drink the solution down to the next mark (approximately 8 oz) until the full liter is complete.   6.  Follow completed prep with 16 oz of clear liquid of your choice (Nothing red  or purple).  Continue to drink clear liquids until bedtime.  7.  Before going to bed, mix second dose of MoviPrep solution:    Empty 1 Pouch A and 1 Pouch B into the disposable container    Add lukewarm drinking water to the top line of the container. Mix to dissolve    Refrigerate  THE DAY OF YOUR PROCEDURE      DATE: 04/14/10 DAY: WEDNESDAY  Beginning at 9:00 a.m. (5 hours before procedure):         1. Every 15 minutes, drink the solution down to the next mark (approx 8 oz) until the full liter is complete.  2. Follow completed prep with 16 oz. of clear liquid of your choice.    3. You may drink clear liquids until 12:30PM (2 HOURS BEFORE PROCEDURE).   MEDICATION INSTRUCTIONS  Unless otherwise instructed, you should take regular prescription medications with a small sip of water   as early as possible the morning of your procedure.   Additional medication  instructions: _         OTHER INSTRUCTIONS  You will need a responsible adult at least 75 years of age to accompany you and drive you home.   This person must remain in the waiting room during your procedure.  Wear loose fitting clothing that is easily removed.  Leave jewelry and other valuables at home.  However, you may wish to bring a book to read or  an iPod/MP3 player to listen to music as you wait for your procedure to start.  Remove all body piercing jewelry and leave at home.  Total time from sign-in until discharge is approximately 2-3 hours.  You should go home directly after your procedure and rest.  You can resume normal activities the  day after your procedure.  The day of your procedure you should not:   Drive   Make legal decisions   Operate machinery   Drink alcohol   Return to work  You will receive specific instructions about eating, activities and medications before you leave.    The above instructions have been reviewed and explained to me by   _______________________    I  fully understand and can verbalize these instructions _____________________________ Date _________

## 2010-12-28 NOTE — Letter (Signed)
Summary: Patient Notice- Polyp Results  [facility]  [address] [place], [place] [address]   Phone: [phone]  Fax: [phone]        Apr 16, 2010 MRN: [id number]    [name] [address] [place], [place]  [address]    Dear [name],  I am pleased to inform you that the colon polyp(s) removed during your recent colonoscopy was (were) found to be benign (no cancer detected) upon pathologic examination.  I recommend you have a repeat colonoscopy examination in 5 years to look for recurrent polyps, as having colon polyps increases your risk for having recurrent polyps or even colon cancer in the future.  Should you develop new or worsening symptoms of abdominal pain, bowel habit changes or bleeding from the rectum or bowels, please schedule an evaluation with either your primary care physician or with me.  Additional information/recommendations:  __ No further action with gastroenterology is needed at this time. Please      follow-up with your primary care physician for your other healthcare      needs.   Please call us if you are having persistent problems or have questions about your condition that have not been fully answered at this time.  Sincerely,  Hilarie Fredrickson MD  This letter has been electronically signed by your physician.  Appended Document: Patient Notice- Polyp Results letter mailed

## 2011-04-12 NOTE — Assessment & Plan Note (Signed)
OFFICE VISIT   Kasper, Guadalupe S  DOB:  [birth year]                                       12/07/2010  ZOXWR#:[id number]   Patient returns today for follow-up regarding his abdominal aortic  aneurysm, which was discovered 3 years ago after some right flank  discomfort.  At that time, it measured 3.5 x 3.6 cm.  He has been  asymptomatic and no abdominal or back symptoms and today returns.  A  duplex scan was ordered by me in the office, which I have reviewed and  interpreted.  This aneurysm appears to be 3.7 x 3.8 cm in maximum  diameter, essentially unchanged from previous studies.   CHRONIC MEDICAL PROBLEMS:  1. Hyperlipidemia.  2. Polymyalgia rheumatica, diagnosed some years ago.  3. History of plantar fasciitis, which has resolved.  4. Negative diabetes, hypertension, coronary artery disease, COPD, or      stroke.   SOCIAL HISTORY:  He is married, has 2 children.  Is a Immunologist.  Does not use tobacco.   REVIEW OF SYSTEMS:  Negative for chest pain, dyspnea on exertion, PND,  orthopnea, chronic bronchitis.  All other systems are negative.   PHYSICAL EXAMINATION:  Blood pressure is 122/72, heart rate 63,  respirations 12.  General:  He is a well-developed and well-nourished  healthy-appearing male in no apparent distress, alert and oriented x3.  HEENT:  Normal for age.  EOMs intact.  Lungs:  Clear to auscultation.  No rhonchi or wheezing.  Abdomen:  Soft, nontender with small pulsatile  masses measuring approximately 3.5 to 4 cm in diameter.  He has 3+  femoral and posterior tibial pulses palpable bilaterally.  Neurologic:  Normal.   The aneurysm is unchanged from a year ago.  We will continue to monitor  this on an annual basis with duplex scanning in our office.  If he  should develop severe abdominal and back symptoms, he will report to the  emergency department, at which time likely this aneurysm will ultimately  require  treatment.     Quita Skye Hart Rochester, M.D.  Electronically Signed   JDL/MEDQ  D:  12/07/2010  T:  12/07/2010  Job:  [id number]   cc:   Lenon Curt. Chilton Si, M.D.

## 2011-04-12 NOTE — Procedures (Signed)
DUPLEX ULTRASOUND OF ABDOMINAL AORTA   INDICATION:  Abdominal aortic aneurysm per Dr. Candie Chroman office note on  11/30/2009.   HISTORY:  Diabetes:  No.  Cardiac:  No.  Hypertension:  No.  Smoking:  No.  Connective Tissue Disorder:  Family History:  No.  Previous Surgery:  No.   DUPLEX EXAM:         AP (cm)                   TRANSVERSE (cm)  Proximal             2.4 cm                    2.3 cm  Mid                  2.8 cm                    2.5 cm  Distal               3.7 cm                    3.8 cm  Right Iliac          1.8 cm                    1.7 cm  Left Iliac           1.5 cm                    1.7 cm   PREVIOUS:  Date:  04/24/2009  AP:  3.6  TRANSVERSE:  3.6   IMPRESSION:  Aneurysmal dilatation of the distal abdominal aorta noted  with no significant change in the maximum diameter when compared to the  previous exam done at [facility].   ___________________________________________  Quita Skye Hart Rochester, M.D.   CH/MEDQ  D:  12/07/2010  T:  12/07/2010  Job:  [id number]

## 2011-04-12 NOTE — Consult Note (Signed)
NEW PATIENT CONSULTATION   [name], [name]  DOB:  09-18-1930                                       11/30/2009  CHART#:[id number]   Patient is a 77 year old male patient referred by Dr. Chilton Si for an  infrarenal abdominal aortic aneurysm.  This aneurysm was discovered 2  years ago after having some right flank discomfort, and it has been  followed on an every-60-month interval with the size currently being 3.5  x 3.6 cm in maximum diameter, only a few millimeters larger than the  last study 6 months ago.  He has had no abdominal or back symptoms.  He  also denies any history of peripheral emboli.   CHRONIC MEDICAL PROBLEMS:  1. Polymyalgia rheumatica diagnosed 10 years ago.  2. Negative for diabetes, hypertension, coronary artery disease,      hyperlipidemia, COPD, or stroke.   PAST SURGICAL HISTORY:  Polypectomy by Dr. Claud Kelp of some colon  polyps as well as an appendectomy at the same setting.   FAMILY HISTORY:  Negative for coronary artery disease, diabetes, or  stroke.   SOCIAL HISTORY:  Is married, has 2 children, and is a Immunologist.  Does not use of tobacco or alcohol.   REVIEW OF SYSTEMS:  Negative for chest pain, dyspnea on exertion, PND,  orthopnea, weight loss, anorexia, hemoptysis, bronchitis.  No GI, GU, or  vascular symptoms.  Denies claudication.  No musculoskeletal or  neurologic symptoms.  All systems are negative in the review of systems.   PHYSICAL EXAMINATION:  Blood pressure 115/73, heart rate 76,  respirations 16.  Generally, this is a healthy-appearing male who is in  no apparent distress.  He is well-developed and well-nourished.  He is  alert and oriented x3.  HEENT:  Normal.  EOMs intact.  Neck is supple,  3+ carotid pulses.  No bruits audible.  Chest:  Clear to auscultation.  Cardiovascular:  Regular rate and rhythm.  No murmurs.  Abdomen:  Soft,  nontender.  There is a very small pulsatile mass in the  periumbilical  area approximating 3-4 cm.  This is nontender.  He has 3+ femoral and  popliteal and 2+ posterior tibial pulses bilaterally.  No distal edema  is noted.  No skin rash is noted.  Neurologic exam is normal.  Musculoskeletal exam reveals no major deformities.   I reviewed the record supplied by Dr. Chilton Si for [facility]  today.  I also reviewed the report from [facility] radiology regarding  the infrarenal aortic aneurysm.   The patient does have a small infrarenal aortic aneurysm which will  likely not require any treatment.  We did have a long discussion  regarding potential treatments, including open repair as well as aortic  stent grafting.  We will follow this on an every-6-month basis unless  it shows evidence of significant enlargement.  I will see him in 12  months with a duplex scan in our office.  If he develops any severe  abdominal or back symptoms, he will report to the emergency department.     Quita Skye Hart Rochester, M.D.  Electronically Signed   JDL/MEDQ  D:  11/30/2009  T:  12/01/2009  Job:  [id number]   cc:   Lenon Curt. Chilton Si, M.D.

## 2011-04-15 NOTE — Op Note (Signed)
NAME:  [name], [name]                          ACCOUNT NO.:  [id number]   MEDICAL RECORD NO.:  [id number]                   PATIENT TYPE:  AMB   LOCATION:  ENDO                                 FACILITY:  [facility]   PHYSICIAN:  Georgiana Spinner, M.D.                 DATE OF BIRTH:  January 19, 1930   DATE OF PROCEDURE:  01/08/2004  DATE OF DISCHARGE:                                 OPERATIVE REPORT   PROCEDURE PERFORMED:  Colonoscopy with biopsy.   ENDOSCOPIST:  Georgiana Spinner, M.D.   INDICATIONS FOR PROCEDURE:  Colon polyp, rectal bleed.   ANESTHESIA:  Demerol 70 mg, Versed 7 mg.   DESCRIPTION OF PROCEDURE:  With the patient mildly sedated in the left  lateral decubitus position, the Olympus videoscopic colonoscope was inserted  in the rectum and passed under direct vision to the cecum, identified by the  ileocecal valve and appendiceal orifice, both of which were photographed.  It was seen that there was polypoid tissue that had recurred once again at  the area of the appendix and this was photographed and biopsied.  The  colonoscope was slowly withdrawn taking circumferential views of the entire  colonic mucosa stopping only in the rectum which appeared normal on direct  and showed hemorrhoids on retroflex view.  The endoscope was straightened  and withdrawn.  The patient's vital signs and pulse oximeter remained  stable.  The patient tolerated the procedure well without apparent  complications.   FINDINGS:  Polyp of the appendiceal area, biopsied.   PLAN:  Since we have tried to eradicate this twice, I think really we need  to do some different form of therapy such as cecectomy and will discuss with  surgery and see if this could be done easily laparoscopically and get good  margins.                                               Georgiana Spinner, M.D.    GMO/MEDQ  D:  01/08/2004  T:  01/08/2004  Job:  [id number]   cc:   Lenon Curt. Chilton Si, M.D.  [address]  [place]  [place] [address]  Fax: [phone]

## 2011-04-15 NOTE — Op Note (Signed)
[name], [name]                ACCOUNT NO.:  [id number]   MEDICAL RECORD NO.:  [id number]          PATIENT TYPE:  AMB   LOCATION:  ENDO                         FACILITY:  [facility]   PHYSICIAN:  Georgiana Spinner, M.D.    DATE OF BIRTH:  1930-08-31   DATE OF PROCEDURE:  01/24/2006  DATE OF DISCHARGE:                                 OPERATIVE REPORT   PROCEDURE:  Colonoscopy.   INDICATIONS:  Follow up of colon polyps.   ANESTHESIA:  Demerol 40 milligrams, Versed 4 milligrams.   PROCEDURE:  With the patient mildly sedated in the left lateral decubitus  position, a rectal examination was performed which was unremarkable.  Subsequently the Olympus videoscopic colonoscope was inserted into the  rectum and passed under direct vision to the cecum identified by crow's foot  of the cecum and ileocecal valve, both of which were photographed.  From  this point the colonoscope was slowly withdrawn taking circumferential views  of colonic mucosa stopping only in the rectum which appeared normal on  direct and showed hemorrhoidal tissue on retroflexed view.  The endoscope  was straightened, withdrawn.  The patient's vital signs, pulse oximeter  remained stable. The patient tolerated procedure well without apparent  complications.   FINDINGS:  Internal hemorrhoids otherwise unremarkable colonoscopic  examination to the cecum.   PLAN:  Consider repeat examination possibly in 5 years           ______________________________  Georgiana Spinner, M.D.     GMO/MEDQ  D:  01/24/2006  T:  01/24/2006  Job:  [id number]   cc:   Lenon Curt. Chilton Si, M.D.  Fax: [phone]   Angelia Mould. Derrell Lolling, M.D.  [address], [address]  [place]  [place] [address]

## 2011-04-15 NOTE — Op Note (Signed)
   NAME:  [name], [name]                          ACCOUNT NO.:  [id number]   MEDICAL RECORD NO.:  [id number]                   PATIENT TYPE:  AMB   LOCATION:  ENDO                                 FACILITY:  [facility]   PHYSICIAN:  Georgiana Spinner, M.D.                 DATE OF BIRTH:  06-22-1930   DATE OF PROCEDURE:  01/14/2003  DATE OF DISCHARGE:                                 OPERATIVE REPORT   PROCEDURE:  Colonoscopy.   INDICATIONS:  Colon cancer screening, colon polyp.   ANESTHESIA:  Demerol 50 mg, Versed 5 mg.   DESCRIPTION OF PROCEDURE:  With the patient mildly sedated in the left  lateral decubitus position, the Olympus videoscopic colonoscope was inserted  into the rectum after a normal rectal exam and passed under direct vision to  the cecum.  Cecum identified by the crow's foot of the cecum and the  ileocecal valve, both of which were photographed.  In the cecum there was  some villous-appearing material which could have been the normal mucosa of  this area, but I could not get full insufflation so therefore I took some  cold biopsies of the area.  From this point the colonoscope was then slowly  withdrawn, taking circumferential views of the entire colonic mucosa,  stopping only in the rectum, which appeared normal on direct view other than  a small polyp that was seen and removed using hot biopsy forceps technique,  setting of 20-20 blended current, and also on retroflexed view.  The  endoscope was straightened and withdrawn.  The patient's vital signs and  pulse oximetry remained stable.  The patient tolerated the procedure well  without apparent complications.   FINDINGS:  Question of polyp in cecum, which may have been normal variant,  and also a small polyp in the rectal area.   PLAN:  Await biopsy report.  The patient will call me for results and follow  up with me as an outpatient.                                               Georgiana Spinner, M.D.    GMO/MEDQ  D:   01/14/2003  T:  01/14/2003  Job:  [id number]   cc:   Lenon Curt. Chilton Si, M.D.  [address]  [place]  [place] [address]  Fax: [phone]

## 2011-04-15 NOTE — Op Note (Signed)
NAME:  [name], [name]                          ACCOUNT NO.:  [id number]   MEDICAL RECORD NO.:  [id number]                   PATIENT TYPE:  AMB   LOCATION:  ENDO                                 FACILITY:  [facility]   PHYSICIAN:  Georgiana Spinner, M.D.                 DATE OF BIRTH:  Dec 13, 1929   DATE OF PROCEDURE:  DATE OF DISCHARGE:                                 OPERATIVE REPORT   PROCEDURE:  Colonoscopy with biopsy.   INDICATIONS:  Rectal bleeding, known colon polyp seen previously.   ANESTHESIA:  Fentanyl 75 mcg, Versed 7.5 mg.   PROCEDURE:  With the patient mildly sedated in the left lateral decubitus  position, a rectal exam was performed which was unremarkable.  Subsequently,  the Olympus videoscopic colonoscope was inserted in the rectum and passed  easily under direct vision into the cecum with pressure applied to the  abdomen.  The cecum was identified via the ileocecal valve and the  appendiceal orifice.  In the area of the appendiceal orifice, the previously  noted area of velvety changes were once again felt to be seen.  Photographs  were taken and after deliberation, felt that we probably should go ahead and  biopsy it and then Erbe eradicate this which is what we proceeded to do.  Biopsies were taken with cold biopsy forceps.  Subsequently, the [employer] set on a 20/200 setting was used to eradicate this area.  At  the termination of this, we felt we had eradicated the entire area,  photograph taken. The endoscope was then withdrawn, taking circumferential  views of the remaining colonic mucosa.  The prep was slightly suboptimal.  As we pulled back all the way to the rectum which appeared normal on direct  and retroflexed view.  The endoscope was straightened and withdrawn.  The  patient's vital signs and pulse oximetry remained stable.  The patient  tolerated the procedure well with no apparent complications.   FINDINGS:  Question of recurrent polyp in  the cecum at the appendiceal  orifice and this was eradicated after biopsy using Erbe Argon  photocoagulator.   PLAN:  Await biopsy report.  I will have the patient call me for results and  follow up with me as an outpatient to determine further course of action.  Additionally, will have the patient avoid aspirin for 14 days and be placed  on a low residue diet for seven days.                                               Georgiana Spinner, M.D.    GMO/MEDQ  D:  10/28/2003  T:  10/28/2003  Job:  [id number]   cc:   Lenon Curt. Chilton Si, M.D.  [address]  [place]  [place] [address]  Fax: [phone]

## 2011-04-15 NOTE — Op Note (Signed)
[facility]  Patient:    [name], [name]                       MRN: [id number] Proc. Date: 01/25/01 Adm. Date:  [id number] Disc. Date: [id number] Attending:  Laqueta Jean CC:         Helene Kelp, M.D.   Operative Report  PREOPERATIVE DIAGNOSES:  Organic sexual dysfunction.  Organic erectile dysfunction.  POSTOPERATIVE DIAGNOSES:  Organic sexual dysfunction.  Organic erectile dysfunction.  OPERATION:  Implantation of Mentor three piece penile prosthesis.  SURGEON:  Sigmund I. Patsi Sears, M.D.  ANESTHESIA:  General endotracheal tube.  PREPARATION:  After appropriate preanesthesia, the patient is brought to the operating room and placed on the operating table in the dorsal supine position where general endotracheal anesthesia was introduced.  He was then washed for 10 minutes with Betadine soap solution, then shaved with the hair clippers, then prepped with Betadine solution, then draped in the usual fashion.  DESCRIPTION OF PROCEDURE:  A 6 cm semilinear incision is made at the base of the penis, and subcutaneous tissue was dissected with the electrosurgical unit.  The midline vascular pedicle was deflected lateralward on both sides, and bilateral corporotomies are made, and the corpora are dissected.  Corpora measurements include a 7 cm proximal incision, with a total corpora measurement of 18 cm on both sides, yielding a 16 cm cylinder, and 2 cm rear-tip extender.  Dissection was accomplished into the abdominal fascia, and the rectus fascia was incised, the rectus muscle deflected medialward, and the reservoir is placed.  The reservoir is then filled with 70 cc of fluid, and autodissects its own plane.  No bleeding was noted.  The fascia was closed with 2-0 Vicryl running suture.  Following this, the cylinders were placed, without difficulty, with 2 cm rear-tip extenders, and the prosthesis cycled and cycled well.  The corporotomies  were closed with running 2-0 Vicryl suture.  The probe was then placed in a standard fashion in the right hemiscrotum, with great care taken to avoid any bleeding or fluid collection.  The connection is then made between the pump tubing and the reservoir, with a straight connector in standard fashion.  Antibiotic irrigation is used throughout the case.  Closure is accomplished with 3-0 Vicryl suture.  Skin staples are used. Sensorcaine 0.5 plain is placed in the subcutaneous tissue of the incision. The patient is given B&O suppository, IV Toradol, as well as Solu-Medrol to cover his daily steroid use.  The entire operation is covered by preoperative IV antibiotics.  The patient is awakened and taken to the recovery room in good condition. DD:  01/26/01 TD:  01/28/01 Job: [id number]

## 2011-04-15 NOTE — H&P (Signed)
NAME:  [name], [name]                          ACCOUNT NO.:  [id number]   MEDICAL RECORD NO.:  [id number]                   PATIENT TYPE:  INP   LOCATION:  3040                                 FACILITY:  [name]   PHYSICIAN:  [name], M.D.               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  He was described by his wife as  making a very serious face when he stated, I am not 77 years old.  She  first thought that he was trying to play a joke, but then soon she and her  son realized that he did not know [place] year, [place] date, [place] time of [place] day,  that he had forgotten all events from [place] two days prior to today and  including that he had [place] at [place], that they had met an  acquaintance [place], that he played golf with his son and that  he had a meeting in [place] morning, all things that he had in great detail and  elaborated on shortly before.  This confusional episode worried [place]  patient's wife very much and she called Dr. Lenon Alexander. [name], [place] family's  primary care physician, who urged her to go to [place] emergency room and have a  neurologist look at her husband.   PAST MEDICAL HISTORY:  None.  [place] patient has polymyalgia rheumatica and  takes prednisone 7 mg daily.  He is currently worked up in [name] for  suspected dermatomyositis.  He takes multiple vitamins, saw palmetto and  flax seed supplement.   ALLERGIES:  He has no known drug allergies.   FAMILY HISTORY:  Family history is positive for global amnesia spell in [place]  patient's brother who had it for one day.  He said that his brother [name] was  confused and diagnosed with a vasospasm.  Both parents are deceased at  advanced age.   SOCIAL HISTORY:  [place] patient is married, has adult children.  His daughter  lives in [name] with her own family.  A son is here in [name].  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  He does not smoke or drink.  He has never used  illegal drugs.   PHYSICAL EXAMINATION:  VITAL SIGNS:  Heart rate 77, blood pressure initially  160/80, now 140/70, temperature 98, respiratory rate 16.  LUNGS:  Lungs clear to auscultation.  CARDIAC:  Regular rate and rhythm of [place] heart.  No murmur.  ABDOMEN:  Abdomen soft and nontender.  EXTREMITIES:  No edema.  SKIN:  There is a rash that is palpable on [place] back of his hands, on Randall  chest and over Randall posterior shoulder region.  [place] patient also has very  rosy-appearing cheeks and a slight moon face, which I attributed to his  steroid treatments.  NEUROLOGIC:  Mental status:  [place] patient is alert and disoriented.  He  perseverates, repeats questions and tries to overplay his confusion by  giving very unspecific answers or answering in form of questions back.  He  has no dysarthria, aphasia or apraxia symptoms and in spite of his memory  being confused, his ability to retain new information is significantly  impaired as well.   Cranial nerve:  Pupils are equal and round to accommodation.  Extraocular  movements are intact.  No nystagmus.  Full visual fields to double  simultaneous stimulation.  Normal retinae.  Facial symmetry.  Sensory of [place]  face is intact.  Tongue and uvula move midline.  Neck is supple.  No bruits.  Shoulders intact.  Motor exam:  Equal tone, strength and mass.  Deep tendon  reflexes 1+ bilaterally.  Downgoing toes.  No clonus.  Sensory is intact to  touch, vibration and temperature.  [place] patient can follow multi-step  commands.  Coordination:  Finger-to-nose is  intact.  Rapid alternating  movements are intact bilaterally.  No pronator drift.  Normal gait.   ASSESSMENT:  This is an almost global amnesia except that [place] patient is  oriented to person.  He has a confusional spell of unknown origin.  No  history of mental illness, no use of medications or drugs that were recently  changed or discontinued and no recent lifestyle changes otherwise.  In his  review of systems, he had not described any chills, fever, nausea, diarrhea,  cramps, fatigue, focal numbness or weakness, lightheadedness or vertigo.   PLAN:  1. We obtained today a CT without contrast that was entirely normal and we     would like to repeat a CT with contrast as [place] patient is, due to a     stapes implant, not able to undergo an MRI examination.  2. Admit to telemetry.  3. Toxicology screen, chemistry, UA and CBC pending.  4. Doppler and 2-D echocardiogram for stroke workup pending.  5. Consider psychiatric consultation.  6. Dr. Frederik Alexander was already informed by Randall wife and I understand that he     is a personal friend of [place] couple so that I will be able to ask him     tomorrow about Randall suspected dermatomyositis.                                                 [name], M.D.    CD/MEDQ  D:  01/06/2003  T:  01/07/2003  Job:  [id number]

## 2011-04-15 NOTE — Op Note (Signed)
   NAME:  [name], [name]                          ACCOUNT NO.:  [id number]   MEDICAL RECORD NO.:  [id number]                   PATIENT TYPE:  AMB   LOCATION:  ENDO                                 FACILITY:  [facility]   PHYSICIAN:  Georgiana Spinner, M.D.                 DATE OF BIRTH:  October 11, 1930   DATE OF PROCEDURE:  03/27/2003  DATE OF DISCHARGE:                                 OPERATIVE REPORT   PROCEDURE PERFORMED:  Colonoscopy with eradication of polyp.   ENDOSCOPIST:  Georgiana Spinner, M.D.   ANESTHESIA:  Demerol 80 mg, Versed 10 mg.   DESCRIPTION OF PROCEDURE:  With the patient mildly sedated in the left  lateral decubitus position, the Olympus video colonoscope was inserted in  the rectum after a normal rectal exam and passed under direct vision to the  cecum, identified by the ileocecal valve and appendiceal orifice, the latter  of which was photographed.  In the cecum, we found once again the velvet-  like, carpet-like polyp that had been previously biopsied and proven to be  an adenomatous polyp.  It was rather large and using Erbe argon  photocoagulation, we eradicated this polyp in its entirety using a setting  of 20/200.  Once I was satisfied that all the adenomatous appearing tissue  had been eradicated, photographs were taken and the colonoscope was then  withdrawn taking circumferential views of the remaining colonic mucosa  stopping only in the rectum which appeared normal on direct and showed  hemorrhoids on retroflex view.  The endoscope was straightened and  withdrawn.  The patient's vital signs and pulse oximeter remained stable.  The patient tolerated the procedure well without apparent complications.   FINDINGS:  Cecal polyp eradicated using argon photocoagulation.   PLAN:  Await clinical response.  Because of the extent of this polyp, we  will plan on a repeat examination possibly in six months to make sure that  we have had full eradication and no recurrence at this  site.                                                 Georgiana Spinner, M.D.    GMO/MEDQ  D:  03/27/2003  T:  03/27/2003  Job:  [id number]   cc:   Lenon Curt. Chilton Si, M.D.  [address]  [place]  [place] [address]  Fax: [phone]

## 2011-04-15 NOTE — Op Note (Signed)
NAME:  [name], [name]                          ACCOUNT NO.:  [id number]   MEDICAL RECORD NO.:  [id number]                   PATIENT TYPE:  INP   LOCATION:  0005                                 FACILITY:  [facility]   PHYSICIAN:  Angelia Mould. Derrell Lolling, M.D.             DATE OF BIRTH:  1929/12/03   DATE OF PROCEDURE:  01/26/2004  DATE OF DISCHARGE:                                 OPERATIVE REPORT   PREOPERATIVE DIAGNOSIS:  Recurrent villous adenoma of the cecum.   POSTOPERATIVE DIAGNOSIS:  Recurrent villous adenoma of the cecum.   OPERATION/PROCEDURE:  Laparoscopic resection of the appendix and cecal tip.   SURGEON:  Angelia Mould. Derrell Lolling, M.D.   FIRST ASSISTANT:  Jimmye Norman, M.D.   OPERATIVE INDICATIONS:  This is a 77 year old white male who has been  followed by Dr. Sabino Gasser for about two years.  He has had several  colonoscopies and at each colonoscopy, he has described a carpet-like polyp  closely associated with the appendiceal orifice.  Biopsies have always  showed adenomatous polyps but no dysplasia.  This has been ablated using the  argon photocoagulator.  The most recent colonoscopy showed the polypoid  tissue had recurred.  Dr. Virginia Rochester referred him to me for consideration of a  local resection to prevent transformation of this polyp into cancer.  The  patient has undergone a bowel prep at home and is brought to the hospital  electively.   OPERATIVE FINDINGS:  Laparoscopic survey of the liver, stomach, omentum,  small and large intestines were grossly normal.  The peritoneal surfaces  looked normal.  There was no ascites.  The appendix looked normal.  After  resecting the appendix and about a 2.5 cm rim of cecum, the specimen was  opened.  At the base of the appendix, there was some discolored tissue,  consistent with the recurrent polyp, but there was no palpable mass or  obvious cancer.  The margins looked excellent.   OPERATIVE TECHNIQUE:  Following the induction of general  endotracheal  anesthesia, a Foley catheter and orogastric tube were placed.  The patient's  abdomen was prepped and draped in a sterile fashion.  Marcaine 0.5% was used  as a local infiltrate and anesthetic.  An 11 mm Optiview port was inserted  in the left mid abdomen and the lateral aspect of the rectus muscle just  above the umbilicus.  A 0 degree camera was used as a guide to traverse the  abdominal wall and the infra-abdominal cavity without any difficulty.  The  abdomen was insufflated to 15 mmHg.  The video camera was inserted.  We  surveyed the abdomen and pelvis and found no bleeding or injury.  Two 5 mm  trocars were placed, one in the suprapubic area and one in the left lower  quadrant.  We ultimately placed a 12 mm trocar in the right upper quadrant  in the subcostal area.  We identified the terminal ileum and the cecum.  Lateral peritoneal attachments were carefully divided using the harmonic  scalpel.  We dissected the appendix and the cecum up and out of its bed.  We  were very careful to identify and preserve the iliac vessels, the testicular  vessels, and the ureter, all of which were preserved and intact at the  completion of the case.  We mobilized the entire cecum and some of the  ascending colon.  We divided the appendiceal mesentery and appendiceal  artery using the harmonic scalpel.  After all this was done, we had the  distal half of the right colon, terminal ileum, and appendix quite mobile to  where we could suspend them anteriorly.  We inserted an endoscopic GIA  stapling device with a 3.5 mm staple height and a 60 mm long load.  This was  inserted from the right upper quadrant, and we positioned this across the  cecum to resect as much of the cecal tip as we could without compromising  the ileocecal valve.  We could see the terminal ileum in the area of the  ileocecal valve quite nicely, and we placed a stapler above this.  The  stapler was then fired and  removed.  The staple line looked good and was  hemostatic.  We placed Tisseel on the staple line.  Photographs were taken.  The specimen was placed in a specimen bag and removed.   I cut open the specimen on the side table using scissors chiefly to remove  the staples and opened up what appeared to be a about a half-dollar button  of cecum.  The margin looked excellent.  There was a little bit of  discolored tissue at the center of the resection down at the base of the  appendix.  I sent the specimen to Dr. Jimmy Picket, and he agreed that there  was no obvious tumor or cancer and that the margins of the resection looked  good.   I inspected the area after the Tisseel dried.  I irrigated, but there was  really almost no blood loss whatsoever.  The cecum and terminal ileum looked  fine.  The iliac vessels and the right ureter looked fine.  The trocars were  removed under direct vision.  There was no bleeding at the trocar sites.  The pneumoperitoneum was released.  The skin incisions were closed with  subcuticular sutures of 4-0 Vicryl and Steri-Strips.  Clean bandages were  placed, and the patient was taken to the recovery room in stable condition.   ESTIMATED BLOOD LOSS:  About 10 cc.   COMPLICATIONS:  None.   SPONGE, NEEDLE, INSTRUMENT COUNTS:  Correct.                                               Angelia Mould. Derrell Lolling, M.D.    HMI/MEDQ  D:  01/26/2004  T:  01/26/2004  Job:  [id number]   cc:   Lenon Curt. Chilton Si, M.D.  [address]  [place]  [place] [address]  Fax: [phone]   Georgiana Spinner, M.D.  [address]  [place]  [place] [address]  Fax: [phone]

## 2011-09-01 ENCOUNTER — Other Ambulatory Visit: Payer: Self-pay | Admitting: Dermatology

## 2011-10-04 ENCOUNTER — Other Ambulatory Visit: Payer: Self-pay | Admitting: Dermatology

## 2011-12-12 DIAGNOSIS — E785 Hyperlipidemia, unspecified: Secondary | ICD-10-CM | POA: Diagnosis not present

## 2011-12-12 DIAGNOSIS — D126 Benign neoplasm of colon, unspecified: Secondary | ICD-10-CM | POA: Diagnosis not present

## 2011-12-12 DIAGNOSIS — R52 Pain, unspecified: Secondary | ICD-10-CM | POA: Diagnosis not present

## 2011-12-19 DIAGNOSIS — H8020 Cochlear otosclerosis, unspecified ear: Secondary | ICD-10-CM | POA: Diagnosis not present

## 2011-12-28 DIAGNOSIS — E785 Hyperlipidemia, unspecified: Secondary | ICD-10-CM | POA: Diagnosis not present

## 2011-12-28 DIAGNOSIS — I714 Abdominal aortic aneurysm, without rupture: Secondary | ICD-10-CM | POA: Diagnosis not present

## 2012-01-04 ENCOUNTER — Encounter (INDEPENDENT_AMBULATORY_CARE_PROVIDER_SITE_OTHER): Payer: Medicare Other | Admitting: *Deleted

## 2012-01-04 ENCOUNTER — Other Ambulatory Visit: Payer: Self-pay

## 2012-01-04 DIAGNOSIS — I714 Abdominal aortic aneurysm, without rupture: Secondary | ICD-10-CM | POA: Diagnosis not present

## 2012-01-19 DIAGNOSIS — L821 Other seborrheic keratosis: Secondary | ICD-10-CM | POA: Diagnosis not present

## 2012-01-19 DIAGNOSIS — L570 Actinic keratosis: Secondary | ICD-10-CM | POA: Diagnosis not present

## 2012-01-19 DIAGNOSIS — D239 Other benign neoplasm of skin, unspecified: Secondary | ICD-10-CM | POA: Diagnosis not present

## 2012-01-23 ENCOUNTER — Other Ambulatory Visit: Payer: Self-pay | Admitting: *Deleted

## 2012-01-23 DIAGNOSIS — I714 Abdominal aortic aneurysm, without rupture: Secondary | ICD-10-CM

## 2012-01-24 ENCOUNTER — Encounter: Payer: Self-pay | Admitting: Vascular Surgery

## 2012-01-24 NOTE — Procedures (Unsigned)
DUPLEX ULTRASOUND OF ABDOMINAL AORTA  INDICATION:  Follow up AAA.  HISTORY: Diabetes:  No. Cardiac:  No. Hypertension:  No. Smoking:  No. Connective Tissue Disorder: Family History:  No. Previous Surgery:  No.  DUPLEX EXAM:         AP (cm)                   TRANSVERSE (cm) Proximal             2.3 cm                    2.3 cm Mid                  3.7 cm                    3.7 cm Distal               2.2 cm                    2.3 cm Right Iliac          1.6 cm Left Iliac           1.5 cm  PREVIOUS:  Date: 12/07/10  AP:  3.7  TRANSVERSE:  3.8  IMPRESSION: 1. Stable abdominal aortic aneurysm measuring approximately 3.7 cm X     3.7 cm. 2. Ectatic right common iliac artery measuring 1.6 cm.  ___________________________________________ Quita Skye Hart [place], M.D.  LT/MEDQ  D:  01/04/2012  T:  01/04/2012  Job:  [id number]

## 2012-01-31 DIAGNOSIS — M542 Cervicalgia: Secondary | ICD-10-CM | POA: Diagnosis not present

## 2012-02-15 DIAGNOSIS — J209 Acute bronchitis, unspecified: Secondary | ICD-10-CM | POA: Diagnosis not present

## 2012-02-16 ENCOUNTER — Ambulatory Visit
Admission: RE | Admit: 2012-02-16 | Discharge: 2012-02-16 | Disposition: A | Discharge status: Home / Self Care | Payer: Medicare Other | Source: Ambulatory Visit | Attending: Internal Medicine | Admitting: Internal Medicine

## 2012-02-16 ENCOUNTER — Other Ambulatory Visit: Payer: Self-pay | Admitting: Internal Medicine

## 2012-02-16 DIAGNOSIS — R05 Cough: Secondary | ICD-10-CM

## 2012-02-16 DIAGNOSIS — R509 Fever, unspecified: Secondary | ICD-10-CM

## 2012-02-16 DIAGNOSIS — M25519 Pain in unspecified shoulder: Secondary | ICD-10-CM | POA: Diagnosis not present

## 2012-02-16 DIAGNOSIS — R059 Cough, unspecified: Secondary | ICD-10-CM

## 2012-02-20 DIAGNOSIS — M19019 Primary osteoarthritis, unspecified shoulder: Secondary | ICD-10-CM | POA: Diagnosis not present

## 2012-02-22 ENCOUNTER — Ambulatory Visit
Admission: RE | Admit: 2012-02-22 | Discharge: 2012-02-22 | Disposition: A | Discharge status: Home / Self Care | Payer: Medicare Other | Source: Ambulatory Visit | Attending: Internal Medicine | Admitting: Internal Medicine

## 2012-02-22 ENCOUNTER — Other Ambulatory Visit: Payer: Self-pay | Admitting: Internal Medicine

## 2012-02-22 DIAGNOSIS — R42 Dizziness and giddiness: Secondary | ICD-10-CM

## 2012-02-22 DIAGNOSIS — R51 Headache: Secondary | ICD-10-CM | POA: Diagnosis not present

## 2012-02-22 DIAGNOSIS — R519 Headache, unspecified: Secondary | ICD-10-CM

## 2012-02-24 DIAGNOSIS — R209 Unspecified disturbances of skin sensation: Secondary | ICD-10-CM | POA: Diagnosis not present

## 2012-03-12 DIAGNOSIS — M19019 Primary osteoarthritis, unspecified shoulder: Secondary | ICD-10-CM | POA: Diagnosis not present

## 2012-03-12 DIAGNOSIS — M542 Cervicalgia: Secondary | ICD-10-CM | POA: Diagnosis not present

## 2012-03-14 DIAGNOSIS — IMO0002 Reserved for concepts with insufficient information to code with codable children: Secondary | ICD-10-CM | POA: Insufficient documentation

## 2012-03-14 DIAGNOSIS — H2510 Age-related nuclear cataract, unspecified eye: Secondary | ICD-10-CM | POA: Diagnosis not present

## 2012-03-14 DIAGNOSIS — H01009 Unspecified blepharitis unspecified eye, unspecified eyelid: Secondary | ICD-10-CM | POA: Diagnosis not present

## 2012-03-14 DIAGNOSIS — H17829 Peripheral opacity of cornea, unspecified eye: Secondary | ICD-10-CM | POA: Diagnosis not present

## 2012-04-20 DIAGNOSIS — H17829 Peripheral opacity of cornea, unspecified eye: Secondary | ICD-10-CM | POA: Diagnosis not present

## 2012-07-24 ENCOUNTER — Other Ambulatory Visit: Payer: Self-pay | Admitting: Dermatology

## 2012-07-24 DIAGNOSIS — D239 Other benign neoplasm of skin, unspecified: Secondary | ICD-10-CM | POA: Diagnosis not present

## 2012-07-24 DIAGNOSIS — I714 Abdominal aortic aneurysm, without rupture: Secondary | ICD-10-CM | POA: Diagnosis not present

## 2012-07-24 DIAGNOSIS — E785 Hyperlipidemia, unspecified: Secondary | ICD-10-CM | POA: Diagnosis not present

## 2012-07-24 DIAGNOSIS — L821 Other seborrheic keratosis: Secondary | ICD-10-CM | POA: Diagnosis not present

## 2012-07-24 DIAGNOSIS — D485 Neoplasm of uncertain behavior of skin: Secondary | ICD-10-CM | POA: Diagnosis not present

## 2012-07-24 DIAGNOSIS — IMO0001 Reserved for inherently not codable concepts without codable children: Secondary | ICD-10-CM | POA: Diagnosis not present

## 2012-07-27 DIAGNOSIS — E785 Hyperlipidemia, unspecified: Secondary | ICD-10-CM | POA: Diagnosis not present

## 2012-07-27 DIAGNOSIS — D126 Benign neoplasm of colon, unspecified: Secondary | ICD-10-CM | POA: Diagnosis not present

## 2012-07-27 DIAGNOSIS — Z79899 Other long term (current) drug therapy: Secondary | ICD-10-CM | POA: Diagnosis not present

## 2012-07-27 DIAGNOSIS — I739 Peripheral vascular disease, unspecified: Secondary | ICD-10-CM | POA: Diagnosis not present

## 2012-07-27 DIAGNOSIS — R972 Elevated prostate specific antigen [PSA]: Secondary | ICD-10-CM | POA: Diagnosis not present

## 2012-09-19 DIAGNOSIS — G5600 Carpal tunnel syndrome, unspecified upper limb: Secondary | ICD-10-CM | POA: Diagnosis not present

## 2012-09-21 DIAGNOSIS — M25519 Pain in unspecified shoulder: Secondary | ICD-10-CM | POA: Diagnosis not present

## 2012-09-28 DIAGNOSIS — I679 Cerebrovascular disease, unspecified: Secondary | ICD-10-CM | POA: Insufficient documentation

## 2012-10-19 DIAGNOSIS — H43399 Other vitreous opacities, unspecified eye: Secondary | ICD-10-CM | POA: Diagnosis not present

## 2012-10-19 DIAGNOSIS — H17829 Peripheral opacity of cornea, unspecified eye: Secondary | ICD-10-CM | POA: Diagnosis not present

## 2012-10-19 DIAGNOSIS — H2510 Age-related nuclear cataract, unspecified eye: Secondary | ICD-10-CM | POA: Diagnosis not present

## 2012-11-19 DIAGNOSIS — H17829 Peripheral opacity of cornea, unspecified eye: Secondary | ICD-10-CM | POA: Diagnosis not present

## 2012-11-26 ENCOUNTER — Encounter: Payer: Self-pay | Admitting: Vascular Surgery

## 2012-12-11 DIAGNOSIS — M79609 Pain in unspecified limb: Secondary | ICD-10-CM | POA: Diagnosis not present

## 2012-12-11 DIAGNOSIS — L02419 Cutaneous abscess of limb, unspecified: Secondary | ICD-10-CM | POA: Diagnosis not present

## 2012-12-31 ENCOUNTER — Encounter: Payer: Self-pay | Admitting: Vascular Surgery

## 2013-01-01 ENCOUNTER — Ambulatory Visit (INDEPENDENT_AMBULATORY_CARE_PROVIDER_SITE_OTHER): Payer: Medicare Other | Admitting: Vascular Surgery

## 2013-01-01 ENCOUNTER — Encounter: Payer: Self-pay | Admitting: Vascular Surgery

## 2013-01-01 ENCOUNTER — Encounter (INDEPENDENT_AMBULATORY_CARE_PROVIDER_SITE_OTHER): Payer: Medicare Other | Admitting: *Deleted

## 2013-01-01 VITALS — BP 129/66 | HR 65 | Ht 72.0 in | Wt 184.0 lb

## 2013-01-01 DIAGNOSIS — I714 Abdominal aortic aneurysm, without rupture, unspecified: Secondary | ICD-10-CM | POA: Insufficient documentation

## 2013-01-01 NOTE — Progress Notes (Signed)
Subjective:     Patient ID: [name], male   DOB: 1930-04-24, 79 y.o.   MRN: [id number]  HPI this 79 year old male returns for continued followup regarding his abdominal aortic aneurysm. Last year the aneurysm measured approximately 3.7 x 3.8 cm. He has had no abnormal or back symptoms. He has remained in excellent health.  Past Medical History  Diagnosis Date  . AAA (abdominal aortic aneurysm)   . Hyperlipidemia   . Polymyalgia rheumatica     History  Substance Use Topics  . Smoking status: Never Smoker   . Smokeless tobacco: Not on file  . Alcohol Use: No    History reviewed. No pertinent family history.  Allergies  Allergen Reactions  . Codeine     Current outpatient prescriptions:fish oil-omega-3 fatty acids 1000 MG capsule, Take 1 g by mouth daily. , Disp: , Rfl: ;  Multiple Vitamins-Minerals (MULTIVITAMIN PO), Take by mouth., Disp: , Rfl: ;  predniSONE (DELTASONE) 5 MG tablet, Take 5 mg by mouth daily., Disp: , Rfl: ;  VITAMIN D, CHOLECALCIFEROL, PO, Take by mouth., Disp: , Rfl:   BP 129/66  Pulse 65  Ht 6' (1.829 m)  Wt 184 lb (83.462 kg)  BMI 24.95 kg/m2  SpO2 100%  Body mass index is 24.95 kg/(m^2).          Review of Systems denies chest pain, dyspnea on exertion, PND, orthopnea, hemoptysis, lateralizing weakness, aphasia, amaurosis fugax, diplopia, claudication.     Objective:   Physical Exam blood pressure 129/66 heart rate 60 respirations 18 Gen.-alert and oriented x3 in no apparent distress HEENT normal for age Lungs no rhonchi or wheezing Cardiovascular regular rhythm no murmurs carotid pulses 3+ palpable no bruits audible Abdomen soft nontender 4 cm pulsatile mass-nontender Musculoskeletal free of  major deformities Skin clear -no rashes Neurologic normal Lower extremities 3+ femoral and dorsalis pedis pulses palpable bilaterally with no edema  Today I ordered a duplex scan of the abdominal aorta which are reviewed and interpreted. The  aneurysm measures 4.0 cm in maximum diameter-a slight increase from one year ago at 3.8      Assessment:     4.0 cm AAA-asymptomatic    Plan:     Return 12 months for followup duplex scan of the abdominal aortic aneurysm

## 2013-01-02 ENCOUNTER — Other Ambulatory Visit: Payer: Self-pay | Admitting: *Deleted

## 2013-01-02 DIAGNOSIS — I714 Abdominal aortic aneurysm, without rupture, unspecified: Secondary | ICD-10-CM

## 2013-01-24 DIAGNOSIS — I789 Disease of capillaries, unspecified: Secondary | ICD-10-CM | POA: Diagnosis not present

## 2013-01-24 DIAGNOSIS — D1801 Hemangioma of skin and subcutaneous tissue: Secondary | ICD-10-CM | POA: Diagnosis not present

## 2013-01-24 DIAGNOSIS — L821 Other seborrheic keratosis: Secondary | ICD-10-CM | POA: Diagnosis not present

## 2013-01-24 DIAGNOSIS — L570 Actinic keratosis: Secondary | ICD-10-CM | POA: Diagnosis not present

## 2013-01-24 DIAGNOSIS — D239 Other benign neoplasm of skin, unspecified: Secondary | ICD-10-CM | POA: Diagnosis not present

## 2013-02-14 DIAGNOSIS — M19019 Primary osteoarthritis, unspecified shoulder: Secondary | ICD-10-CM | POA: Diagnosis not present

## 2013-03-08 DIAGNOSIS — H2510 Age-related nuclear cataract, unspecified eye: Secondary | ICD-10-CM | POA: Diagnosis not present

## 2013-03-08 DIAGNOSIS — H17829 Peripheral opacity of cornea, unspecified eye: Secondary | ICD-10-CM | POA: Diagnosis not present

## 2013-04-10 DIAGNOSIS — H17829 Peripheral opacity of cornea, unspecified eye: Secondary | ICD-10-CM | POA: Diagnosis not present

## 2013-04-10 DIAGNOSIS — H2510 Age-related nuclear cataract, unspecified eye: Secondary | ICD-10-CM | POA: Diagnosis not present

## 2013-04-23 DIAGNOSIS — S838X9A Sprain of other specified parts of unspecified knee, initial encounter: Secondary | ICD-10-CM | POA: Diagnosis not present

## 2013-04-23 DIAGNOSIS — M25569 Pain in unspecified knee: Secondary | ICD-10-CM | POA: Diagnosis not present

## 2013-05-20 DIAGNOSIS — M25569 Pain in unspecified knee: Secondary | ICD-10-CM | POA: Diagnosis not present

## 2013-05-20 DIAGNOSIS — IMO0002 Reserved for concepts with insufficient information to code with codable children: Secondary | ICD-10-CM | POA: Diagnosis not present

## 2013-06-03 ENCOUNTER — Other Ambulatory Visit: Payer: Self-pay | Admitting: Orthopedic Surgery

## 2013-06-03 DIAGNOSIS — M25562 Pain in left knee: Secondary | ICD-10-CM

## 2013-06-06 DIAGNOSIS — M25569 Pain in unspecified knee: Secondary | ICD-10-CM | POA: Diagnosis not present

## 2013-06-12 ENCOUNTER — Ambulatory Visit
Admission: RE | Admit: 2013-06-12 | Discharge: 2013-06-12 | Disposition: A | Discharge status: Home / Self Care | Payer: Medicare Other | Source: Ambulatory Visit | Attending: Orthopedic Surgery | Admitting: Orthopedic Surgery

## 2013-06-12 DIAGNOSIS — M25569 Pain in unspecified knee: Secondary | ICD-10-CM | POA: Diagnosis not present

## 2013-06-12 DIAGNOSIS — M25562 Pain in left knee: Secondary | ICD-10-CM

## 2013-06-12 MED ORDER — IOHEXOL 180 MG/ML  SOLN
20.0000 mL | Freq: Once | INTRAMUSCULAR | Status: AC | PRN
  Administered 2013-06-12: 20 mL via INTRA_ARTICULAR

## 2013-06-18 ENCOUNTER — Other Ambulatory Visit: Payer: Self-pay | Admitting: Geriatric Medicine

## 2013-06-19 ENCOUNTER — Other Ambulatory Visit: Payer: Self-pay | Admitting: Geriatric Medicine

## 2013-06-19 MED ORDER — METAFOLBIC 6-1-50-5 MG PO TABS: 1.0000 | ORAL_TABLET | Freq: Every day | ORAL | Status: DC

## 2013-06-20 ENCOUNTER — Other Ambulatory Visit: Payer: Self-pay | Admitting: *Deleted

## 2013-06-20 MED ORDER — METAFOLBIC 6-1-50-5 MG PO TABS: 1.0000 | ORAL_TABLET | Freq: Two times a day (BID) | ORAL | Status: DC

## 2013-07-23 ENCOUNTER — Other Ambulatory Visit: Payer: Self-pay | Admitting: *Deleted

## 2013-07-23 MED ORDER — BUTALBITAL-APAP-CAFFEINE 50-325-40 MG PO TABS: ORAL_TABLET | ORAL | Status: DC

## 2013-07-26 DIAGNOSIS — D1801 Hemangioma of skin and subcutaneous tissue: Secondary | ICD-10-CM | POA: Diagnosis not present

## 2013-07-26 DIAGNOSIS — Z85828 Personal history of other malignant neoplasm of skin: Secondary | ICD-10-CM | POA: Diagnosis not present

## 2013-07-26 DIAGNOSIS — L821 Other seborrheic keratosis: Secondary | ICD-10-CM | POA: Diagnosis not present

## 2013-07-26 DIAGNOSIS — D239 Other benign neoplasm of skin, unspecified: Secondary | ICD-10-CM | POA: Diagnosis not present

## 2013-08-15 ENCOUNTER — Other Ambulatory Visit: Payer: Self-pay | Admitting: *Deleted

## 2013-08-15 DIAGNOSIS — Z79899 Other long term (current) drug therapy: Secondary | ICD-10-CM

## 2013-08-15 DIAGNOSIS — R972 Elevated prostate specific antigen [PSA]: Secondary | ICD-10-CM

## 2013-08-15 DIAGNOSIS — E785 Hyperlipidemia, unspecified: Secondary | ICD-10-CM

## 2013-08-16 ENCOUNTER — Other Ambulatory Visit: Payer: Self-pay | Admitting: Vascular Surgery

## 2013-08-16 ENCOUNTER — Other Ambulatory Visit: Payer: Medicare Other

## 2013-08-16 DIAGNOSIS — Z79899 Other long term (current) drug therapy: Secondary | ICD-10-CM | POA: Diagnosis not present

## 2013-08-16 DIAGNOSIS — I714 Abdominal aortic aneurysm, without rupture: Secondary | ICD-10-CM

## 2013-08-16 DIAGNOSIS — R972 Elevated prostate specific antigen [PSA]: Secondary | ICD-10-CM

## 2013-08-16 DIAGNOSIS — E785 Hyperlipidemia, unspecified: Secondary | ICD-10-CM

## 2013-08-16 NOTE — Addendum Note (Signed)
Addended by: Maurice Small on: 08/16/2013 08:24 AM   Modules accepted: Orders

## 2013-08-17 LAB — CBC WITH DIFFERENTIAL/PLATELET
Eos: 2 %
HCT: 42.3 % (ref 37.5–51.0)
Immature Granulocytes: 0 %
Lymphocytes Absolute: 1.9 10*3/uL (ref 0.7–3.1)
MCV: 96 fL (ref 79–97)
Monocytes Absolute: 0.7 10*3/uL (ref 0.1–0.9)
RDW: 13.2 % (ref 12.3–15.4)
WBC: 8 10*3/uL (ref 3.4–10.8)

## 2013-08-17 LAB — COMPREHENSIVE METABOLIC PANEL
ALT: 22 IU/L (ref 0–44)
CO2: 26 mmol/L (ref 18–29)
Calcium: 9.1 mg/dL (ref 8.6–10.2)
Chloride: 101 mmol/L (ref 97–108)
Glucose: 93 mg/dL (ref 65–99)
Potassium: 4.7 mmol/L (ref 3.5–5.2)
Total Bilirubin: 0.4 mg/dL (ref 0.0–1.2)
Total Protein: 6.2 g/dL (ref 6.0–8.5)

## 2013-08-17 LAB — LIPID PANEL
Cholesterol, Total: 227 mg/dL — ABNORMAL HIGH (ref 100–199)
HDL: 53 mg/dL (ref >39)
VLDL Cholesterol Cal: 22 mg/dL (ref 5–40)

## 2013-08-20 ENCOUNTER — Other Ambulatory Visit: Payer: Self-pay

## 2013-08-20 ENCOUNTER — Encounter: Payer: Self-pay | Admitting: Internal Medicine

## 2013-08-20 ENCOUNTER — Ambulatory Visit (INDEPENDENT_AMBULATORY_CARE_PROVIDER_SITE_OTHER): Payer: Medicare Other | Admitting: Internal Medicine

## 2013-08-20 VITALS — BP 120/80 | HR 66 | Temp 97.8°F | Resp 18 | Ht 72.0 in | Wt 188.2 lb

## 2013-08-20 DIAGNOSIS — Z23 Encounter for immunization: Secondary | ICD-10-CM

## 2013-08-20 DIAGNOSIS — E785 Hyperlipidemia, unspecified: Secondary | ICD-10-CM

## 2013-08-20 DIAGNOSIS — N529 Male erectile dysfunction, unspecified: Secondary | ICD-10-CM

## 2013-08-20 DIAGNOSIS — M353 Polymyalgia rheumatica: Secondary | ICD-10-CM | POA: Insufficient documentation

## 2013-08-20 DIAGNOSIS — E559 Vitamin D deficiency, unspecified: Secondary | ICD-10-CM | POA: Insufficient documentation

## 2013-08-20 DIAGNOSIS — I714 Abdominal aortic aneurysm, without rupture: Secondary | ICD-10-CM

## 2013-08-20 MED ORDER — METAFOLBIC 6-1-50-5 MG PO TABS: 1.0000 | ORAL_TABLET | Freq: Two times a day (BID) | ORAL | Status: DC

## 2013-08-20 NOTE — Progress Notes (Deleted)
  Subjective:    Patient ID: [name], male    DOB: Dec 17, 1929, 79 y.o.   MRN: [id number]  HPI  Past Medical History  Diagnosis Date  . AAA (abdominal aortic aneurysm)   . Hyperlipidemia   . Polymyalgia rheumatica     Current Outpatient Prescriptions on File Prior to Visit  Medication Sig Dispense Refill  . butalbital-acetaminophen-caffeine (FIORICET) 50-325-40 MG per tablet Take one to two tablets up to four times daily for headaches  30 tablet  0  . fish oil-omega-3 fatty acids 1000 MG capsule Take 1 g by mouth daily.       . Multiple Vitamins-Minerals (MULTIVITAMIN PO) Take by mouth.      . predniSONE (DELTASONE) 5 MG tablet Take 5 mg by mouth daily.      Marland Kitchen VITAMIN D, CHOLECALCIFEROL, PO Take by mouth.       No current facility-administered medications on file prior to visit.    Review of Systems     Objective:BP 120/80  Pulse 66  Temp(Src) 97.8 F (36.6 C) (Oral)  Resp 18  Ht 6' (1.829 m)  Wt 188 lb 3.2 oz (85.367 kg)  BMI 25.52 kg/m2  SpO2 97%    Physical Exam        Assessment & Plan:

## 2013-08-20 NOTE — Patient Instructions (Signed)
Try to reduce prednisone to 1/2 tablet daily.

## 2013-10-11 DIAGNOSIS — H17829 Peripheral opacity of cornea, unspecified eye: Secondary | ICD-10-CM | POA: Diagnosis not present

## 2013-10-11 DIAGNOSIS — H2510 Age-related nuclear cataract, unspecified eye: Secondary | ICD-10-CM | POA: Diagnosis not present

## 2013-10-12 ENCOUNTER — Encounter: Payer: Self-pay | Admitting: Internal Medicine

## 2013-10-12 NOTE — Progress Notes (Signed)
Patient ID: [name], male   DOB: 1930-07-06, 79 y.o.   MRN: [id number]      Place of Service:   [facility]  PCP: Kimber Relic, MD  Code Status: Living Will  Allergies  Allergen Reactions  . Codeine     Chief Complaint  Patient presents with  . Annual Exam    HPI:  Patient reports for complete exam and followup of medical conditions.  Polymyalgia rheumatic: Chronic condition which is been treated with steroids for nearly 2 days. Her muscle aches are under good control.  Other and unspecified hyperlipidemia:: LDL is running high.  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 He has a penile implant.  Need for prophylactic vaccination and inoculation against influenza      Past Medical History  Diagnosis Date  . AAA (abdominal aortic aneurysm)   . Hyperlipidemia   . Polymyalgia rheumatica   . Benign neoplasm of colon 04/14/10    3 small polyps on colonoscopy by Dr. Marina Goodell  . Impotence of organic origin     Penile implant  . Elevated prostate specific antigen (PSA)   . Disturbances metabolism of methionine, homocystine, and cystathionine     Elevated homocysteine  . Migraine with aura, without mention of intractable migraine without mention of status migrainosus   . Carpal tunnel syndrome   . Otosclerosis of both ears   . Hearing loss   . Peripheral vascular disease     Bilateral femoral bruit  . Internal hemorrhoids   . Allergic rhinitis   . Shoulder pain   . Degenerative disc disease   . Neck pain   . Rotator cuff syndrome of left shoulder   . Plantar fasciitis   . Scoliosis   . Abnormal EKG     Left atrial abnormality    Past Surgical History  Procedure Laterality Date  . Colonoscopy w/ polypectomy    . Stapedectomy Bilateral 1985, 1988    [employer]  . Implant penile pump  2001  . Resection of appendix and tip of rectum  February 2005  . Actinic keratosis removal  06/21/2010    Left shoulder; Dr. Kevan Ny Dr. Nile Riggs Ophthalmology  Dr. Darrelyn Hillock Orthopedic Surgery  Dr. Meryl Crutch Neurology  Dr. Phylliss Bob Rheumatoid Dr. Derrell Lolling General Surgery  Dr. Teressa Senter  Orthopedic Hand Surgery Dr. Sandria Manly Neurology  Dr. Patsi Sears Urologist Dr. Girard Callas Allergy/Asthma Dr. Amanda Pea Orthopedic  Dr. Valere Dross Ophthalmology [facility]   Dr. Luiz Blare Orthopedic Dr. Marina Goodell GI Dr [facility]  PAST PROCEDURES 12/04/1996 Flex Sig Internal Hemorrhoids  08/25/1999 Bone Density  12/03/1999 Flat upright Abdomen  08/06/2002 Chest X-Ray  IVP Normal  2004 Colonoscopy: two polyps removed  11/14/2005 CT of the Lumbar Spine  01/09/2006 CXR 01/11/2006 Abdominal Ultrasound  06/27/2006 Ultrasound of the Abdominal Aorta 12/29/06 [place] Abd: fusiform AAA; no change 06-18-2007 US Abdomen, AAA slightly increase in size compared to 12-2005, ectasia of the common iliac  arteries. 06-25-2007 PNCV right carpal tunnel 12-03-2007 US Abdomen unchanged 04/11/2008 Chest x-ray negative 04/11/2008 US abdominal slight increase of abdominal aortic aneurysm 3.3x3.6 10/31/2008 US abdominal no change  04/24/09 [place] Abd: AAA 3.6cm infrarenal ; no change 10/19/2010 Abdominal US- slight increase in AA by 31m; mild dilation of iliac arteries which appears stable appears to be increasing thrombus within the aneurysm sac 04/14/2010 Colonoscopy -3 small benign  polyps removed by Dr. Marina Goodell 12/07/10 [place] abd: unchanged AAA 02/16/2012 X-Ray of the Shoulder Degenerative change No acute fracture  02/16/2012 X-Ray of the Chest No active lung disease No change in slight hyperaeration 02/22/2012 CT of the Head No acute abnormality    Social History: History   Social History  . Marital Status: Married    Spouse Name: [name]    Number of Children: 2  . Years of Education: N/A    Occupational History  . Loss adjuster, chartered    Social History Main Topics  . Smoking status: Never Smoker   . Smokeless tobacco: None  . Alcohol Use: No  . Drug Use: No  . Sexual Activity: None   Other Topics Concern  . 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    Family History Family Status  Relation Status Death Age  . Father Deceased 52    Murdered in a robbery  . Mother Deceased 45    Heart disease. Emphysema.  . Brother Alive   . Son Alive   . Son Alive      Medications: Patient's Medications  New Prescriptions   No medications on file  Previous Medications   BUTALBITAL-ACETAMINOPHEN-CAFFEINE (FIORICET) 50-325-40 MG PER TABLET    Take one to two tablets up to four times daily for headaches   FISH OIL-OMEGA-3 FATTY ACIDS 1000 MG CAPSULE    Take 1 g by mouth daily.    L-METHYLFOLATE-B12-B6-B2 (METAFOLBIC) 04-28-49-5 MG TABS    Take 1 tablet by mouth 2 (two) times daily.   MULTIPLE VITAMINS-MINERALS (MULTIVITAMIN PO)    Take by mouth.   PREDNISONE (DELTASONE) 5 MG TABLET    Take 5 mg by mouth daily.   VITAMIN D, CHOLECALCIFEROL, PO    Take by mouth.  Modified Medications   No medications on file  Discontinued Medications   No medications on file    Immunization History  Administered Date(s) Administered  . Influenza,inj,Quad PF,36+ Mos 08/20/2013     Review of Systems  Constitutional: Negative for fever, chills, weight loss, malaise/fatigue and diaphoresis.  HENT: Positive for hearing loss. Negative for congestion, ear discharge, ear pain, nosebleeds and tinnitus.   Eyes: Negative.   Respiratory: Negative.   Cardiovascular: Negative.   Gastrointestinal: Negative.   Genitourinary:       Impotence. Penile implant.  Musculoskeletal: Negative.   Skin: Negative for itching and rash.  Neurological: Negative.  Negative for weakness and headaches.  Endo/Heme/Allergies:  Negative.   Psychiatric/Behavioral: Negative.      Filed Vitals:   08/20/13 1429  BP: 120/80  Pulse: 66  Temp: 97.8 F (36.6 C)  TempSrc: Oral  Resp: 18  Height: 6' (1.829 m)  Weight: 188 lb 3.2 oz (85.367 kg)  SpO2: 97%   Physical Exam  Constitutional: He is oriented to person, place, and time. He appears well-developed and well-nourished. No distress.  HENT:  Head: Normocephalic.  Right Ear: External ear normal.  Left Ear: External ear normal.  Nose: Nose normal.  Mouth/Throat: Oropharynx is clear and moist.  Partial hearing loss bilaterally.  Eyes: Conjunctivae and EOM are normal. Pupils are equal, round, and reactive to light.  Contact lenses.  Neck: Neck supple. No JVD present. No tracheal deviation present. No thyromegaly present.  Cardiovascular: Normal rate, regular rhythm, normal heart sounds and intact distal pulses.  Exam reveals no gallop and no friction rub.   No murmur  heard. 1/4 bilateral femoral bruit. Lungs/4 abdominal aortic bruit. Increase pulsation at the abdominal aorta.  Pulmonary/Chest: No respiratory distress. He has no wheezes. He has no rales.  Abdominal: He exhibits no distension and no mass. There is no tenderness.  Genitourinary: Rectum normal and prostate normal. Guaiac negative stool. No penile tenderness.  Penile implant  Musculoskeletal: Normal range of motion. He exhibits no edema and no tenderness.  Scoliosis  Neurological: He is alert and oriented to person, place, and time. He displays normal reflexes. No cranial nerve deficit. Coordination normal.  Skin: No rash noted. He is not diaphoretic. No erythema. No pallor.  Psychiatric: He has a normal mood and affect. His behavior is normal. Judgment and thought content normal.      Labs reviewed: Appointment on 08/16/2013  Component Date Value Range Status  . Glucose 08/16/2013 93  65 - 99 mg/dL Final  . BUN 04/54/0981 24  8 - 27 mg/dL Final  . Creatinine, Ser 08/16/2013 1.10  0.76 -  1.27 mg/dL Final  . GFR calc non Af Amer 08/16/2013 63  >59 mL/min/1.73 Final  . GFR calc Af Amer 08/16/2013 73  >59 mL/min/1.73 Final  . BUN/Creatinine Ratio 08/16/2013 22  10 - 22 Final  . Sodium 08/16/2013 142  134 - 144 mmol/L Final  . Potassium 08/16/2013 4.7  3.5 - 5.2 mmol/L Final  . Chloride 08/16/2013 101  97 - 108 mmol/L Final  . CO2 08/16/2013 26  18 - 29 mmol/L Final  . Calcium 08/16/2013 9.1  8.6 - 10.2 mg/dL Final  . Total Protein 08/16/2013 6.2  6.0 - 8.5 g/dL Final  . Albumin 19/14/7829 4.2  3.5 - 4.7 g/dL Final  . Globulin, Total 08/16/2013 2.0  1.5 - 4.5 g/dL Final  . Albumin/Globulin Ratio 08/16/2013 2.1  1.1 - 2.5 Final  . Total Bilirubin 08/16/2013 0.4  0.0 - 1.2 mg/dL Final  . Alkaline Phosphatase 08/16/2013 60  39 - 117 IU/L Final  . AST 08/16/2013 24  0 - 40 IU/L Final  . ALT 08/16/2013 22  0 - 44 IU/L Final  . Cholesterol, Total 08/16/2013 227* 100 - 199 mg/dL Final  . Triglycerides 08/16/2013 108  0 - 149 mg/dL Final  . HDL 56/21/3086 53  >39 mg/dL Final   Comment: According to ATP-III Guidelines, HDL-C >59 mg/dL is considered a                          negative risk factor for CHD.  Marland Kitchen VLDL Cholesterol Cal 08/16/2013 22  5 - 40 mg/dL Final  . LDL Calculated 08/16/2013 578* 0 - 99 mg/dL Final  . Chol/HDL Ratio 08/16/2013 4.3  0.0 - 5.0 ratio units Final  . PSA 08/16/2013 3.5  0.0 - 4.0 ng/mL Final   Comment: Roche ECLIA methodology.                          According to the American Urological Association, Serum PSA should                          decrease and remain at undetectable levels after radical                          prostatectomy. The AUA defines biochemical recurrence as an initial  PSA value 0.2 ng/mL or greater followed by a subsequent confirmatory                          PSA value 0.2 ng/mL or greater.                          Values obtained with different assay methods or kits cannot be used                           interchangeably. Results cannot be interpreted as absolute evidence                          of the presence or absence of malignant disease.  . WBC 08/16/2013 8.0  3.4 - 10.8 x10E3/uL Final  . RBC 08/16/2013 4.41  4.14 - 5.80 x10E6/uL Final  . Hemoglobin 08/16/2013 15.0  12.6 - 17.7 g/dL Final  . HCT 69/62/9528 42.3  37.5 - 51.0 % Final  . MCV 08/16/2013 96  79 - 97 fL Final  . MCH 08/16/2013 34.0* 26.6 - 33.0 pg Final  . MCHC 08/16/2013 35.5  31.5 - 35.7 g/dL Final  . RDW 41/32/4401 13.2  12.3 - 15.4 % Final  . Neutrophils Relative % 08/16/2013 66   Final  . Lymphs 08/16/2013 24   Final  . Monocytes 08/16/2013 8   Final  . Eos 08/16/2013 2   Final  . Basos 08/16/2013 0   Final  . Neutrophils Absolute 08/16/2013 5.3  1.4 - 7.0 x10E3/uL Final  . Lymphocytes Absolute 08/16/2013 1.9  0.7 - 3.1 x10E3/uL Final  . Monocytes Absolute 08/16/2013 0.7  0.1 - 0.9 x10E3/uL Final  . Eosinophils Absolute 08/16/2013 0.1  0.0 - 0.4 x10E3/uL Final  . Basophils Absolute 08/16/2013 0.0  0.0 - 0.2 x10E3/uL Final  . Immature Granulocytes 08/16/2013 0   Final  . Immature Grans (Abs) 08/16/2013 0.0  0.0 - 0.1 x10E3/uL Final     Assessment/Plan 1. Other and unspecified hyperlipidemia Currently off medications. Because of myalgias, I have been hesitant to add additional medications for better control of LDL.  2. Polymyalgia rheumatica Improved  3. Unspecified vitamin D deficiency Compensated  4. Abdominal aneurysm without mention of rupture Patient has been getting regular ultrasound followup through [facility]  5. Impotence of organic origin Chronic. Unchanged.  6. Need for prophylactic vaccination and inoculation against influenza Administered

## 2013-10-14 DIAGNOSIS — M720 Palmar fascial fibromatosis [Dupuytren]: Secondary | ICD-10-CM | POA: Diagnosis not present

## 2013-10-16 ENCOUNTER — Other Ambulatory Visit: Payer: Self-pay | Admitting: *Deleted

## 2013-10-16 MED ORDER — METAFOLBIC 6-1-50-5 MG PO TABS: 1.0000 | ORAL_TABLET | Freq: Two times a day (BID) | ORAL | Status: DC

## 2013-10-17 ENCOUNTER — Other Ambulatory Visit: Payer: Self-pay

## 2013-10-17 MED ORDER — BUTALBITAL-APAP-CAFFEINE 50-325-40 MG PO TABS: ORAL_TABLET | ORAL | Status: DC

## 2013-10-17 NOTE — Telephone Encounter (Signed)
Manually faxed received requesting refill on APAP-Butalbital/Caff 325-50-40, per Shanda Bumps ok to fill with 1 additional refill. RX was called into pharmacy

## 2013-11-19 ENCOUNTER — Other Ambulatory Visit: Payer: Self-pay | Admitting: Orthopedic Surgery

## 2013-12-02 ENCOUNTER — Encounter (HOSPITAL_BASED_OUTPATIENT_CLINIC_OR_DEPARTMENT_OTHER): Payer: Self-pay | Admitting: *Deleted

## 2013-12-02 ENCOUNTER — Encounter (HOSPITAL_BASED_OUTPATIENT_CLINIC_OR_DEPARTMENT_OTHER)
Admission: RE | Admit: 2013-12-02 | Discharge: 2013-12-02 | Disposition: A | Discharge status: Home / Self Care | Payer: Medicare Other | Source: Ambulatory Visit | Attending: Orthopedic Surgery | Admitting: Orthopedic Surgery

## 2013-12-02 DIAGNOSIS — Z01812 Encounter for preprocedural laboratory examination: Secondary | ICD-10-CM | POA: Diagnosis not present

## 2013-12-02 DIAGNOSIS — M720 Palmar fascial fibromatosis [Dupuytren]: Secondary | ICD-10-CM | POA: Diagnosis not present

## 2013-12-02 DIAGNOSIS — M79609 Pain in unspecified limb: Secondary | ICD-10-CM | POA: Diagnosis not present

## 2013-12-02 DIAGNOSIS — M19049 Primary osteoarthritis, unspecified hand: Secondary | ICD-10-CM | POA: Diagnosis not present

## 2013-12-02 DIAGNOSIS — Z01810 Encounter for preprocedural cardiovascular examination: Secondary | ICD-10-CM | POA: Diagnosis not present

## 2013-12-02 LAB — BASIC METABOLIC PANEL
BUN: 20 mg/dL (ref 6–23)
CO2: 25 meq/L (ref 19–32)
CREATININE: 1.02 mg/dL (ref 0.50–1.35)
Calcium: 8.9 mg/dL (ref 8.4–10.5)
Chloride: 102 mEq/L (ref 96–112)
GFR calc Af Amer: 77 mL/min — ABNORMAL LOW (ref >90)
GFR calc non Af Amer: 67 mL/min — ABNORMAL LOW (ref >90)
Glucose, Bld: 82 mg/dL (ref 70–99)
Potassium: 4.4 mEq/L (ref 3.7–5.3)
SODIUM: 139 meq/L (ref 137–147)

## 2013-12-02 NOTE — Progress Notes (Signed)
To come in fro bmet since on chronic steroids

## 2013-12-04 NOTE — H&P (Signed)
[name] is an 80 y.o. male.   Chief Complaint: c/o contracture of multiple digits of the right hand HPI:   I followed [name] for many years for a number of predicaments including polymyalgia rheumatica, Dupuytren's palmar fibromatosis, trigger fingers and osteoarthritis.  [name] has done very well.  He was worked up by rheumatologist at [facility] . He presents now with increasing contracture of the right hand digits, especially the small finger. He feels that he is ready to proceed with surgical intervention.  Past Medical History  Diagnosis Date  . AAA (abdominal aortic aneurysm)   . Hyperlipidemia   . Polymyalgia rheumatica   . Benign neoplasm of colon 04/14/10    3 small polyps on colonoscopy by Dr. Henrene Pastor  . Impotence of organic origin     Penile implant  . Elevated prostate specific antigen (PSA)   . Disturbances metabolism of methionine, homocystine, and cystathionine     Elevated homocysteine  . Migraine with aura, without mention of intractable migraine without mention of status migrainosus   . Carpal tunnel syndrome   . Otosclerosis of both ears   . Hearing loss   . Peripheral vascular disease     Bilateral femoral bruit  . Internal hemorrhoids   . Allergic rhinitis   . Shoulder pain   . Degenerative disc disease   . Neck pain   . Rotator cuff syndrome of left shoulder   . Plantar fasciitis   . Scoliosis   . Abnormal EKG     Left atrial abnormality    Past Surgical History  Procedure Laterality Date  . Colonoscopy w/ polypectomy    . Stapedectomy Bilateral 1985, [place]  . Implant penile pump  2001  . Resection of appendix and tip of rectum  February 2005  . Actinic keratosis removal  06/21/2010    Left shoulder; Dr. Sarajane Jews    History reviewed. No pertinent family history. Social History:  reports that he has never smoked. He does not have any smokeless tobacco history on file. He reports that he does not drink alcohol or use illicit  drugs.  Allergies:  Allergies  Allergen Reactions  . Codeine     No prescriptions prior to admission    Results for orders placed during the hospital encounter of 12/05/13 (from the past 48 hour(s))  BASIC METABOLIC PANEL     Status: Abnormal   Collection Time    12/02/13 12:30 PM      Result Value Range   Sodium 139  137 - 147 mEq/L   Potassium 4.4  3.7 - 5.3 mEq/L   Chloride 102  96 - 112 mEq/L   CO2 25  19 - 32 mEq/L   Glucose, Bld 82  70 - 99 mg/dL   BUN 20  6 - 23 mg/dL   Creatinine, Ser 1.02  0.50 - 1.35 mg/dL   Calcium 8.9  8.4 - 10.5 mg/dL   GFR calc non Af Amer 67 (*) >90 mL/min   GFR calc Af Amer 77 (*) >90 mL/min   Comment: (NOTE)     The eGFR has been calculated using the CKD EPI equation.     This calculation has not been validated in all clinical situations.     eGFR's persistently <90 mL/min signify possible Chronic Kidney     Disease.    No results found.   Pertinent items are noted in HPI.  Height 6' (1.829 m), weight 81.647 kg (180 lb).  General appearance: alert Head: Normocephalic, without obvious abnormality Neck: supple, symmetrical, trachea midline Resp: clear to auscultation bilaterally Cardio: regular rate and rhythm GI: normal findings: aorta normal Extremities:  He now has a nearly 90 degree flexion contracture of his small finger PIP joint, progressive palmar disease of the long and ring fingers. N/V intact to all digits.  Pulses: 2+ and symmetric Skin: normal Neurologic: Grossly normal    Assessment/Plan Impression: Dupuytren's contracture of the right hand long, ring and small fingers.  Plan: To the OR for excision of Dupuytren's contracture of the right hand.The procedure, risks,benefits and post-op course were discussed with the patient at length and they were in agreement with the plan.  DASNOIT,Caelynn Marshman J 12/04/2013, 10:09 AM   H&P documentation: 12/05/2013  -History and Physical Reviewed  -Patient has been  re-examined  -No change in the plan of care  Cammie Sickle, MD

## 2013-12-05 ENCOUNTER — Encounter (HOSPITAL_BASED_OUTPATIENT_CLINIC_OR_DEPARTMENT_OTHER): Payer: Self-pay

## 2013-12-05 ENCOUNTER — Encounter (HOSPITAL_BASED_OUTPATIENT_CLINIC_OR_DEPARTMENT_OTHER): Payer: Medicare Other | Admitting: Anesthesiology

## 2013-12-05 ENCOUNTER — Ambulatory Visit (HOSPITAL_BASED_OUTPATIENT_CLINIC_OR_DEPARTMENT_OTHER): Payer: Medicare Other | Admitting: Anesthesiology

## 2013-12-05 ENCOUNTER — Encounter (HOSPITAL_BASED_OUTPATIENT_CLINIC_OR_DEPARTMENT_OTHER)
Admission: RE | Disposition: A | Discharge status: Home / Self Care | Payer: Self-pay | Source: Ambulatory Visit | Attending: Orthopedic Surgery

## 2013-12-05 ENCOUNTER — Other Ambulatory Visit: Payer: Self-pay | Admitting: *Deleted

## 2013-12-05 ENCOUNTER — Ambulatory Visit (HOSPITAL_BASED_OUTPATIENT_CLINIC_OR_DEPARTMENT_OTHER)
Admission: RE | Admit: 2013-12-05 | Discharge: 2013-12-05 | Disposition: A | Discharge status: Home / Self Care | Payer: Medicare Other | Source: Ambulatory Visit | Attending: Orthopedic Surgery | Admitting: Orthopedic Surgery

## 2013-12-05 ENCOUNTER — Telehealth: Payer: Self-pay | Admitting: *Deleted

## 2013-12-05 DIAGNOSIS — G8918 Other acute postprocedural pain: Secondary | ICD-10-CM | POA: Diagnosis not present

## 2013-12-05 DIAGNOSIS — M720 Palmar fascial fibromatosis [Dupuytren]: Secondary | ICD-10-CM | POA: Diagnosis not present

## 2013-12-05 DIAGNOSIS — M19049 Primary osteoarthritis, unspecified hand: Secondary | ICD-10-CM | POA: Insufficient documentation

## 2013-12-05 DIAGNOSIS — M25549 Pain in joints of unspecified hand: Secondary | ICD-10-CM | POA: Diagnosis not present

## 2013-12-05 DIAGNOSIS — Z01812 Encounter for preprocedural laboratory examination: Secondary | ICD-10-CM | POA: Insufficient documentation

## 2013-12-05 DIAGNOSIS — Z01810 Encounter for preprocedural cardiovascular examination: Secondary | ICD-10-CM | POA: Insufficient documentation

## 2013-12-05 HISTORY — PX: DUPUYTREN CONTRACTURE RELEASE: SHX1478

## 2013-12-05 LAB — POCT HEMOGLOBIN-HEMACUE: HEMOGLOBIN: 15.3 g/dL (ref 13.0–17.0)

## 2013-12-05 SURGERY — RELEASE, DUPUYTREN CONTRACTURE
Anesthesia: General | Site: Hand | Laterality: Right

## 2013-12-05 MED ORDER — MIDAZOLAM HCL 2 MG/2ML IJ SOLN
1.0000 mg | INTRAMUSCULAR | Status: DC | PRN
  Administered 2013-12-05: 1 mg via INTRAVENOUS

## 2013-12-05 MED ORDER — METHYLPREDNISOLONE ACETATE 40 MG/ML IJ SUSP
INTRAMUSCULAR | Status: AC
  Filled 2013-12-05: qty 1

## 2013-12-05 MED ORDER — FENTANYL CITRATE 0.05 MG/ML IJ SOLN
INTRAMUSCULAR | Status: AC
  Filled 2013-12-05: qty 4

## 2013-12-05 MED ORDER — HYDROMORPHONE HCL PF 1 MG/ML IJ SOLN: 0.2500 mg | INTRAMUSCULAR | Status: DC | PRN

## 2013-12-05 MED ORDER — CEPHALEXIN 500 MG PO CAPS: 500.0000 mg | ORAL_CAPSULE | Freq: Three times a day (TID) | ORAL | Status: DC

## 2013-12-05 MED ORDER — OSELTAMIVIR PHOSPHATE 75 MG PO CAPS: ORAL_CAPSULE | ORAL | Status: DC

## 2013-12-05 MED ORDER — ROPIVACAINE HCL 5 MG/ML IJ SOLN
INTRAMUSCULAR | Status: DC | PRN
  Administered 2013-12-05: 30 mL via PERINEURAL

## 2013-12-05 MED ORDER — SILVER SULFADIAZINE 1 % EX CREA
TOPICAL_CREAM | CUTANEOUS | Status: AC
  Filled 2013-12-05: qty 85

## 2013-12-05 MED ORDER — ONDANSETRON HCL 4 MG/2ML IJ SOLN
INTRAMUSCULAR | Status: DC | PRN
  Administered 2013-12-05: 4 mg via INTRAVENOUS

## 2013-12-05 MED ORDER — PROMETHAZINE HCL 25 MG/ML IJ SOLN: 6.2500 mg | INTRAMUSCULAR | Status: DC | PRN

## 2013-12-05 MED ORDER — MIDAZOLAM HCL 2 MG/2ML IJ SOLN
INTRAMUSCULAR | Status: AC
  Filled 2013-12-05: qty 2

## 2013-12-05 MED ORDER — PROPOFOL 10 MG/ML IV BOLUS
INTRAVENOUS | Status: AC
  Filled 2013-12-05: qty 20

## 2013-12-05 MED ORDER — SILVER SULFADIAZINE 1 % EX CREA
TOPICAL_CREAM | CUTANEOUS | Status: DC | PRN
  Administered 2013-12-05: 1 via TOPICAL

## 2013-12-05 MED ORDER — DEXAMETHASONE SODIUM PHOSPHATE 4 MG/ML IJ SOLN
INTRAMUSCULAR | Status: DC | PRN
  Administered 2013-12-05: 10 mg via INTRAVENOUS

## 2013-12-05 MED ORDER — HYDROMORPHONE HCL 2 MG PO TABS: 2.0000 mg | ORAL_TABLET | ORAL | Status: DC | PRN

## 2013-12-05 MED ORDER — OXYCODONE HCL 5 MG PO TABS: 5.0000 mg | ORAL_TABLET | Freq: Once | ORAL | Status: DC | PRN

## 2013-12-05 MED ORDER — FENTANYL CITRATE 0.05 MG/ML IJ SOLN
INTRAMUSCULAR | Status: AC
  Filled 2013-12-05: qty 2

## 2013-12-05 MED ORDER — LIDOCAINE HCL (CARDIAC) 20 MG/ML IV SOLN
INTRAVENOUS | Status: DC | PRN
  Administered 2013-12-05: 50 mg via INTRAVENOUS

## 2013-12-05 MED ORDER — OXYCODONE HCL 5 MG/5ML PO SOLN: 5.0000 mg | Freq: Once | ORAL | Status: DC | PRN

## 2013-12-05 MED ORDER — FENTANYL CITRATE 0.05 MG/ML IJ SOLN
50.0000 ug | INTRAMUSCULAR | Status: DC | PRN
  Administered 2013-12-05: 50 ug via INTRAVENOUS

## 2013-12-05 MED ORDER — PROPOFOL 10 MG/ML IV BOLUS
INTRAVENOUS | Status: DC | PRN
  Administered 2013-12-05: 200 mg via INTRAVENOUS

## 2013-12-05 MED ORDER — CHLORHEXIDINE GLUCONATE 4 % EX LIQD: 60.0000 mL | Freq: Once | CUTANEOUS | Status: DC

## 2013-12-05 MED ORDER — CEFAZOLIN SODIUM-DEXTROSE 2-3 GM-% IV SOLR
2.0000 g | Freq: Once | INTRAVENOUS | Status: AC
  Administered 2013-12-05: 2 g via INTRAVENOUS

## 2013-12-05 MED ORDER — LACTATED RINGERS IV SOLN
INTRAVENOUS | Status: DC
  Administered 2013-12-05 (×2): via INTRAVENOUS

## 2013-12-05 MED ORDER — LIDOCAINE HCL 2 % IJ SOLN
INTRAMUSCULAR | Status: AC
  Filled 2013-12-05: qty 20

## 2013-12-05 SURGICAL SUPPLY — 58 items
BANDAGE ADH SHEER 1  50/CT (GAUZE/BANDAGES/DRESSINGS) IMPLANT
BANDAGE ELASTIC 3 VELCRO ST LF (GAUZE/BANDAGES/DRESSINGS) IMPLANT
BLADE MINI RND TIP GREEN BEAV (BLADE) ×3 IMPLANT
BLADE SURG 15 STRL LF DISP TIS (BLADE) ×2 IMPLANT
BLADE SURG 15 STRL SS (BLADE) ×4
BNDG COHESIVE 3X5 TAN STRL LF (GAUZE/BANDAGES/DRESSINGS) ×3 IMPLANT
BNDG CONFORM 3 STRL LF (GAUZE/BANDAGES/DRESSINGS) IMPLANT
BNDG ESMARK 4X9 LF (GAUZE/BANDAGES/DRESSINGS) ×3 IMPLANT
BNDG GAUZE ELAST 4 BULKY (GAUZE/BANDAGES/DRESSINGS) ×6 IMPLANT
BRUSH SCRUB EZ PLAIN DRY (MISCELLANEOUS) ×3 IMPLANT
CLOSURE WOUND 1/2 X4 (GAUZE/BANDAGES/DRESSINGS)
CORDS BIPOLAR (ELECTRODE) ×3 IMPLANT
COVER MAYO STAND STRL (DRAPES) ×3 IMPLANT
COVER TABLE BACK 60X90 (DRAPES) ×3 IMPLANT
CUFF TOURNIQUET SINGLE 18IN (TOURNIQUET CUFF) ×3 IMPLANT
DECANTER SPIKE VIAL GLASS SM (MISCELLANEOUS) IMPLANT
DRAPE EXTREMITY T 121X128X90 (DRAPE) ×3 IMPLANT
DRAPE SURG 17X23 STRL (DRAPES) ×3 IMPLANT
DRSG EMULSION OIL 3X3 NADH (GAUZE/BANDAGES/DRESSINGS) ×3 IMPLANT
GLOVE BIO SURGEON STRL SZ7 (GLOVE) ×6 IMPLANT
GLOVE BIOGEL M STRL SZ7.5 (GLOVE) ×6 IMPLANT
GLOVE BIOGEL PI IND STRL 7.0 (GLOVE) ×1 IMPLANT
GLOVE BIOGEL PI IND STRL 7.5 (GLOVE) ×1 IMPLANT
GLOVE BIOGEL PI INDICATOR 7.0 (GLOVE) ×2
GLOVE BIOGEL PI INDICATOR 7.5 (GLOVE) ×2
GLOVE ECLIPSE 7.0 STRL STRAW (GLOVE) ×3 IMPLANT
GLOVE EXAM NITRILE EXT CFF LRG (GLOVE) ×3 IMPLANT
GLOVE ORTHO TXT STRL SZ7.5 (GLOVE) ×3 IMPLANT
GOWN STRL REUS W/ TWL LRG LVL3 (GOWN DISPOSABLE) ×1 IMPLANT
GOWN STRL REUS W/TWL 2XL LVL3 (GOWN DISPOSABLE) ×3 IMPLANT
GOWN STRL REUS W/TWL LRG LVL3 (GOWN DISPOSABLE) ×2
GOWN STRL REUS W/TWL XL LVL3 (GOWN DISPOSABLE) ×6 IMPLANT
LOOP VESSEL MAXI BLUE (MISCELLANEOUS) IMPLANT
NEEDLE 27GAX1X1/2 (NEEDLE) IMPLANT
NEEDLE HYPO 25X1 1.5 SAFETY (NEEDLE) IMPLANT
NS IRRIG 1000ML POUR BTL (IV SOLUTION) ×3 IMPLANT
PACK BASIN DAY SURGERY FS (CUSTOM PROCEDURE TRAY) ×3 IMPLANT
PAD CAST 3X4 CTTN HI CHSV (CAST SUPPLIES) ×1 IMPLANT
PADDING CAST ABS 4INX4YD NS (CAST SUPPLIES)
PADDING CAST ABS COTTON 4X4 ST (CAST SUPPLIES) IMPLANT
PADDING CAST COTTON 3X4 STRL (CAST SUPPLIES) ×2
SLEEVE SCD COMPRESS KNEE MED (MISCELLANEOUS) ×3 IMPLANT
SPLINT PLASTER CAST XFAST 3X15 (CAST SUPPLIES) ×8 IMPLANT
SPLINT PLASTER XTRA FASTSET 3X (CAST SUPPLIES) ×16
SPONGE GAUZE 4X4 12PLY (GAUZE/BANDAGES/DRESSINGS) ×3 IMPLANT
STOCKINETTE 4X48 STRL (DRAPES) ×3 IMPLANT
STRIP CLOSURE SKIN 1/2X4 (GAUZE/BANDAGES/DRESSINGS) IMPLANT
SUT ETHILON 5 0 P 3 18 (SUTURE) ×6
SUT NYLON ETHILON 5-0 P-3 1X18 (SUTURE) ×3 IMPLANT
SUT SILK 4 0 PS 2 (SUTURE) ×3 IMPLANT
SUT VIC AB 4-0 P2 18 (SUTURE) IMPLANT
SYR 3ML 23GX1 SAFETY (SYRINGE) IMPLANT
SYR BULB 3OZ (MISCELLANEOUS) ×3 IMPLANT
SYR CONTROL 10ML LL (SYRINGE) IMPLANT
TOWEL OR 17X24 6PK STRL BLUE (TOWEL DISPOSABLE) ×6 IMPLANT
TOWEL OR NON WOVEN STRL DISP B (DISPOSABLE) IMPLANT
TRAY DSU PREP LF (CUSTOM PROCEDURE TRAY) ×3 IMPLANT
UNDERPAD 30X30 INCONTINENT (UNDERPADS AND DIAPERS) IMPLANT

## 2013-12-05 NOTE — Brief Op Note (Signed)
12/05/2013  12:24 PM  PATIENT:  [name]  80 y.o. male  PRE-OPERATIVE DIAGNOSIS:  CONTRACTURE RIGHT LONG,RING AND SMALL FINGERS  POST-OPERATIVE DIAGNOSIS:  CONTRACTURE RIGHT LONG,RING AND SMALL  PROCEDURE:  Procedure(s): DUPUYTREN CONTRACTURE RELEASE RIGHT LONG, RING AND SMALL FINGERS (Right)  SURGEON:  Surgeon(s) and Role:    * Cammie Sickle., MD - Primary  PHYSICIAN ASSISTANT:   ASSISTANTS: Kathyrn Sheriff.A-C    ANESTHESIA:   general  EBL:  Total I/O In: 1000 [I.V.:1000] Out: -   BLOOD ADMINISTERED:none  DRAINS: none   LOCAL MEDICATIONS USED:  ROPIVACAINE SUPRA CLAVICULAR BLOCK  SPECIMEN:  Biopsy / Limited Resection  DISPOSITION OF SPECIMEN:  PATHOLOGY  COUNTS:  YES  TOURNIQUET:   Total Tourniquet Time Documented: Upper Arm (Left) - 98 minutes Total: Upper Arm (Left) - 98 minutes   DICTATION: .Other Dictation: Dictation Number [id number]  PLAN OF CARE: Discharge to home after PACU  PATIENT DISPOSITION:  PACU - hemodynamically stable.   Delay start of Pharmacological VTE agent (>24hrs) due to surgical blood loss or risk of bleeding: not applicable

## 2013-12-05 NOTE — Anesthesia Preprocedure Evaluation (Signed)
Anesthesia Evaluation  Patient identified by MRN, date of birth, ID band Patient awake    Reviewed: Allergy & Precautions, H&P , NPO status   Airway Mallampati: II      Dental   Pulmonary  breath sounds clear to auscultation        Cardiovascular Rhythm:Regular Rate:Normal  AAA by report    Neuro/Psych  Headaches,    GI/Hepatic   Endo/Other    Renal/GU      Musculoskeletal   Abdominal   Peds  Hematology   Anesthesia Other Findings   Reproductive/Obstetrics                           Anesthesia Physical Anesthesia Plan  ASA: II  Anesthesia Plan: General   Post-op Pain Management:    Induction: Intravenous  Airway Management Planned: LMA  Additional Equipment:   Intra-op Plan:   Post-operative Plan: Extubation in OR  Informed Consent: I have reviewed the patients History and Physical, chart, labs and discussed the procedure including the risks, benefits and alternatives for the proposed anesthesia with the patient or authorized representative who has indicated his/her understanding and acceptance.   Dental advisory given  Plan Discussed with:   Anesthesia Plan Comments:         Anesthesia Quick Evaluation

## 2013-12-05 NOTE — Discharge Instructions (Signed)
[facility] Center Instructions °Hand Surgery ° °Wound Care: °Keep your hand elevated above the level of your heart.  Do not allow it to dangle by your side.  Keep the dressing dry and do not remove it unless your doctor advises you to do so.  He will usually change it at the time of your post-op visit.  Moving your fingers is advised to stimulate circulation but will depend on the site of your surgery.  If you have a splint applied, your doctor will advise you regarding movement. ° °Activity: °Do not drive or operate machinery today.  Rest today and then you may return to your normal activity and work as indicated by your physician. ° °Diet:  °Drink liquids today or eat a light diet.  You may resume a regular diet tomorrow.   ° °General expectations: °Pain for two to three days. °Fingers may become slightly swollen. ° °Call your doctor if any of the following occur: °Severe pain not relieved by pain medication. °Elevated temperature. °Dressing soaked with blood. °Inability to move fingers. °White or bluish color to fingers. ° ° °Post Anesthesia Home Care Instructions ° °Activity: °Get plenty of rest for the remainder of the day. A responsible adult should stay with you for 24 hours following the procedure.  °For the next 24 hours, DO NOT: °-Drive a car °-Operate machinery °-Drink alcoholic beverages °-Take any medication unless instructed by your physician °-Make any legal decisions or sign important papers. ° °Meals: °Start with liquid foods such as gelatin or soup. Progress to regular foods as tolerated. Avoid greasy, spicy, heavy foods. If nausea and/or vomiting occur, drink only clear liquids until the nausea and/or vomiting subsides. Call your physician if vomiting continues. ° °Special Instructions/Symptoms: °Your throat may feel dry or sore from the anesthesia or the breathing tube placed in your throat during surgery. If this causes discomfort, gargle with warm salt water. The discomfort should disappear within 24  hours. ° ° °Regional Anesthesia Blocks ° °1. Numbness or the inability to move the "blocked" extremity may last from 3-48 hours after placement. The length of time depends on the medication injected and your individual response to the medication. If the numbness is not going away after 48 hours, call your surgeon. ° °2. The extremity that is blocked will need to be protected until the numbness is gone and the  Strength has returned. Because you cannot feel it, you will need to take extra care to avoid injury. Because it may be weak, you may have difficulty moving it or using it. You may not know what position it is in without looking at it while the block is in effect. ° °3. For blocks in the legs and feet, returning to weight bearing and walking needs to be done carefully. You will need to wait until the numbness is entirely gone and the strength has returned. You should be able to move your leg and foot normally before you try and bear weight or walk. You will need someone to be with you when you first try to ensure you do not fall and possibly risk injury. ° °4. Bruising and tenderness at the needle site are common side effects and will resolve in a few days. ° °5. Persistent numbness or new problems with movement should be communicated to the surgeon or the [facility] ([phone])/ [facility] ([phone]). °

## 2013-12-05 NOTE — Progress Notes (Signed)
Assisted Dr. Massagee with right, ultrasound guided, supraclavicular block. Side rails up, monitors on throughout procedure. See vital signs in flow sheet. Tolerated Procedure well. 

## 2013-12-05 NOTE — Anesthesia Postprocedure Evaluation (Signed)
  Anesthesia Post-op Note  Patient: [name]  Procedure(s) Performed: Procedure(s): DUPUYTREN CONTRACTURE RELEASE RIGHT LONG, RING AND SMALL FINGERS (Right)  Patient Location: PACU  Anesthesia Type:GA combined with regional for post-op pain  Level of Consciousness: awake and alert   Airway and Oxygen Therapy: Patient Spontanous Breathing  Post-op Pain: none  Post-op Assessment: Post-op Vital signs reviewed  Post-op Vital Signs: stable  Complications: No apparent anesthesia complications

## 2013-12-05 NOTE — Telephone Encounter (Signed)
Patient was around is granddaughter last night which she was diagnosed with the flu today and patient just had hand surgery today and is on Keflex.  Called Dr. Nyoka Cowden and he said to call in Tamiflu 75mg  One daily for flu prophylactic #14  Patient wife Notified and faxed into pharmacy Maggie Font

## 2013-12-05 NOTE — Op Note (Signed)
[id number] 

## 2013-12-05 NOTE — Transfer of Care (Signed)
Immediate Anesthesia Transfer of Care Note  Patient: [name]  Procedure(s) Performed: Procedure(s): DUPUYTREN CONTRACTURE RELEASE RIGHT LONG, RING AND SMALL FINGERS (Right)  Patient Location: PACU  Anesthesia Type:GA combined with regional for post-op pain  Level of Consciousness: awake, sedated and patient cooperative  Airway & Oxygen Therapy: Patient Spontanous Breathing and Patient connected to face mask oxygen  Post-op Assessment: Report given to PACU RN and Post -op Vital signs reviewed and stable  Post vital signs: Reviewed and stable  Complications: No apparent anesthesia complications

## 2013-12-05 NOTE — Anesthesia Procedure Notes (Addendum)
Anesthesia Regional Block:  Supraclavicular block  Pre-Anesthetic Checklist: ,, timeout performed, Correct Patient, Correct Site, Correct Laterality, Correct Procedure, Correct Position, site marked, Risks and benefits discussed, Surgical consent,  Pre-op evaluation,  At surgeon's request  Laterality: Right and Upper  Prep: chloraprep       Needles:   Needle Type: Echogenic Needle      Needle Gauge: 21 and 21 G  Needle insertion depth: 4 cm   Additional Needles:  Procedures: ultrasound guided (picture in chart) and nerve stimulator Supraclavicular block Narrative:  Start time: 12/05/2013 9:20 AM End time: 12/05/2013 9:39 AM Injection made incrementally with aspirations every 5 mL.  Performed by: Personally  Anesthesiologist: T Massagee  Additional Notes: Monitors on , sedation begun, tolerated well   Procedure Name: LMA Insertion Date/Time: 12/05/2013 10:18 AM Performed by: Lyndee Leo Pre-anesthesia Checklist: Patient identified, Emergency Drugs available, Suction available and Patient being monitored Patient Re-evaluated:Patient Re-evaluated prior to inductionOxygen Delivery Method: Circle System Utilized Preoxygenation: Pre-oxygenation with 100% oxygen Intubation Type: IV induction Ventilation: Mask ventilation without difficulty LMA: LMA inserted LMA Size: 4.0 Number of attempts: 1 Airway Equipment and Method: bite block Placement Confirmation: positive ETCO2 Tube secured with: Tape Dental Injury: Teeth and Oropharynx as per pre-operative assessment

## 2013-12-06 ENCOUNTER — Encounter (HOSPITAL_BASED_OUTPATIENT_CLINIC_OR_DEPARTMENT_OTHER): Payer: Self-pay | Admitting: Orthopedic Surgery

## 2013-12-06 NOTE — Op Note (Signed)
NAME:  "Confesor, Capel Kuper          ACCOUNT NO.:  [id number]  MEDICAL RECORD NO.:  [id number]  LOCATION:                                 FACILITY:  PHYSICIAN:  Youlanda Mighty. Srah Ake, M.D.      DATE OF BIRTH:  DATE OF PROCEDURE:  12/05/2013 DATE OF DISCHARGE:                              OPERATIVE REPORT   PREOPERATIVE DIAGNOSES:  Progressive Dupuytren palmar fibromatosis, right hand, with a profound flexion contracture of right small finger proximal interphalangeal joint, measured at 90 degrees, tendency towards boutonniere posture of the small finger, and significant involvement of pretendinous fibers to carpal tunnel level of small finger, also involvement of pretendinous fibers and lateral fascial sheets of ring finger, extending into PIP joint from a carpal canal and significant involvement of right long finger, pretendinous fibers in palm.  POSTOPERATIVE DIAGNOSES:  Progressive Dupuytren palmar fibromatosis, right hand, with a profound flexion contracture of right small finger proximal interphalangeal joint, measured at 90 degrees, tendency towards boutonniere posture of the small finger, and significant involvement of pretendinous fibers to carpal tunnel level of small finger, also involvement of pretendinous fibers and lateral fascial sheets of ring finger, extending into PIP joint from a carpal canal and significant involvement of right long finger, pretendinous fibers in palm.  OPERATIONS: 1. Resection of complex Dupuytren palmar fibromatosis from right palm     and small finger with V-Y advancement flaps for skin reduction and     extremely complex dissection of ulnar neurovascular bundle due to     fascia, nodules between the radial proper digital artery and nerve. 2. Resection of pretendinous fibers and pathologic fascia to ring     finger to the level of proximal interphalangeal joint. 3. Resection of pretendinous fibers to long finger, right palm.  OPERATING SURGEON:   Youlanda Mighty. Eugune Sine, MD  ASSISTANT:  Marily Lente Dasnoit, PA  ANESTHESIA:  General by LMA supplemented by a supraclavicular block.  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  He has a history of [place] ancestry and developed very significant Dupuytren palmar fibromatosis approximately five years prior.  We have been following him for a number of orthopedic predicaments.  He had a transient bout of polymyalgia rheumatica, treated at [facility] in the Department of Rheumatology.  We have discussed timing of management of his Dupuytren palmar fibromatosis.  At this point in time, he has a 90-degree flexion fracture of his small finger PIP joint and extensive palmar involvement to the ring and small fingers, as well as pretendinous disease to the long finger.  He is past the point where an injection of collagenase or needle aponeurotomy would correct his predicament.  He was brought to the operating room at this time, anticipating fasciotomy and fasciectomy.  Preoperatively, he was reminded of the potential risks and benefits of surgery.  He does have some background osteoarthritis that could lead to problematic stiffness of his IP joints following surgery.  Questions were invited and answered in detail.  DESCRIPTION OF PROCEDURE:  Chancelor Lichtenstein was interviewed in the holding area by Dr. Orene Desanctis  of Anesthesia, and after detailed anesthesia informed consent, had a supraclavicular block placed with ultrasound guidance.  This led to anesthesia in the median distribution in the hand, but not in the ulnar distribution.  His right hand and fingers were marked as a proper surgical site per protocol with a marking pen. Questions were invited and answered in detail in the holding area.  We discussed his postoperative pain management, elevation of his hand,  as well as his exercise, do's and don'ts.  Mr. Hickam was then transferred to room 2 of the [facility] where under Dr. Griffin Dakin direct supervision, the decision was made to convert to an LMA general anesthesia due to persistent sensibility in the ulnar distribution of his hand which is the primary surgical location.  An LMA was placed under Dr. Griffin Dakin direct supervision, followed by stable general anesthesia.  A 2 g of Ancef was administered as an IV prophylactic antibiotic with the indication being duration of surgery.  The right hand and arm were prepped with Betadine soap and solution, sterilely draped.  A pneumatic tourniquet was applied to the proximal right brachium.  Following exsanguination of the right hand and arm with Esmarch bandage, the arterial tourniquet was inflated to 220 mmHg.  Following routine surgical time-out, procedure commenced with planning extensive Brunner zigzag incision that could be extended to V-Y advancement flaps for the small and ring fingers.  These were taken sharply followed by meticulous elevation of skin flaps.  The pretendinous fibers to the small finger as well as extensive disease over the metacarpophalangeal joint region was dissected.  This was a very thick cord from the abductor digiti minimi to the lateral fascial sheet.  There were complex spiral bands and retrovascular cords on the ulnar aspect of the finger that required extremely complex dissection.  They are actually bands of Dupuytren's that were between the radial proper digital artery and nerve at the level of proximal phalangeal segment and around the PIP joint.  There was extensive involvement of the flexor sheath with nodules adherent to the A3 pulley.  There was a central cord distally.  With meticulous dissection, the pathologic disease of the small finger was resected, allowing hyperextension of the MP joint and extension of the PIP joint to about 25  degrees.  Due to pre-existing osteoarthritis, I did not attempt a formal joint release.  It has been my experience the joint release in this setting at age [age] in the presence of osteoarthritis can lead to very significant stiffness and may increase the chance of a CRPS type 1 response.  Bleeding points were electrocauterized followed by placement of corner sutures of 5-0 nylon.  Attention was then directed to the ring finger. A meticulous dissection of the entire pretendinous fibers of the palmar fascia was accomplished followed by dissection of the natatory ligaments and a lateral fascial sheet to the level of the PIP joint.  After this was relieved, the MP flexion contracture was fully corrected and hyperextension of the MP joint was recovered.  We then elevated the radial skin flap and resected a 3-cm segment of the pretendinous fibers to the long finger, taking care to identify and spare the neurovascular structures in the palm.  This led to complete correction of the MP flexion contractures of the long, ring, and small fingers.  The ring finger had full extension and we accepted a 25-degree flexion contracture of the PIP joint.  We may try serial casting, although with the background osteoarthritis, this decision  will be made clinically in approximately two weeks.  The V-Y correction of the flaps was accomplished with a partial skin resection to remove redundant skin created by excision of the large Dupuytren nodules in the small finger.  The wound margins repaired with corner suture of 5-0 nylon and simple interrupted sutures of 5-0 nylon.  The tourniquet was released with immediate capillary refill to the fingers and thumb.  Compression was maintained for 5 minutes for hemostasis followed by application of voluminous gauze dressing with Adaptic, Silvadene, sterile gauze, sterile Kerlix, sterile Webril, and a volar plaster splint, maintaining the small finger in full  extension. We tailored the splint to allow 3-point prehension with the thumb, index, and long fingers.  For aftercare, Mr. Tobler was provided prescription for Dilaudid 2 mg 1 or 2 tablets p.o. q.4-6 hours p.r.n. pain, 30 tablets with refill, also Keflex 500 mg 1 p.o. x4 days as prophylactic antibiotic.     Youlanda Mighty Annaliz Aven, M.D.     RVS/MEDQ  D:  12/05/2013  T:  12/06/2013  Job:  [id number]  cc:   Viviann Spare. Nyoka Cowden, M.D.

## 2013-12-19 DIAGNOSIS — M76899 Other specified enthesopathies of unspecified lower limb, excluding foot: Secondary | ICD-10-CM | POA: Diagnosis not present

## 2013-12-19 DIAGNOSIS — M5430 Sciatica, unspecified side: Secondary | ICD-10-CM | POA: Diagnosis not present

## 2014-01-06 ENCOUNTER — Encounter: Payer: Self-pay | Admitting: Vascular Surgery

## 2014-01-07 ENCOUNTER — Other Ambulatory Visit: Payer: Self-pay | Admitting: Vascular Surgery

## 2014-01-07 ENCOUNTER — Ambulatory Visit (INDEPENDENT_AMBULATORY_CARE_PROVIDER_SITE_OTHER): Payer: Medicare Other | Admitting: Vascular Surgery

## 2014-01-07 ENCOUNTER — Ambulatory Visit (HOSPITAL_COMMUNITY)
Admission: RE | Admit: 2014-01-07 | Discharge: 2014-01-07 | Disposition: A | Discharge status: Home / Self Care | Payer: Medicare Other | Source: Ambulatory Visit | Attending: Vascular Surgery | Admitting: Vascular Surgery

## 2014-01-07 ENCOUNTER — Encounter: Payer: Self-pay | Admitting: Vascular Surgery

## 2014-01-07 VITALS — BP 135/84 | HR 69 | Resp 16 | Ht 72.0 in | Wt 180.0 lb

## 2014-01-07 DIAGNOSIS — I714 Abdominal aortic aneurysm, without rupture, unspecified: Secondary | ICD-10-CM | POA: Diagnosis not present

## 2014-01-07 NOTE — Addendum Note (Signed)
Addended by: Dorthula Rue L on: 01/07/2014 10:56 AM   Modules accepted: Orders

## 2014-01-07 NOTE — Progress Notes (Signed)
Subjective:     Patient ID: [name], male   DOB: June 14, 1930, 80 y.o.   MRN: [id number]  HPI this 80 year old male returns today for continued followup regarding his abdominal aortic aneurysm. This has been followed since 2010 when it was 3.6 cm in maximum diameter by ultrasound. Today he returns with a duplex scan of his aortic aneurysm and he has increased slightly to 4 point one 9 cm from 4.0 cm one year ago. He denies any abdominal or back symptoms. He continues to be in excellent health.  Past Medical History  Diagnosis Date  . AAA (abdominal aortic aneurysm)   . Hyperlipidemia   . Polymyalgia rheumatica   . Benign neoplasm of colon 04/14/10    3 small polyps on colonoscopy by Dr. Henrene Pastor  . Impotence of organic origin     Penile implant  . Elevated prostate specific antigen (PSA)   . Disturbances metabolism of methionine, homocystine, and cystathionine     Elevated homocysteine  . Migraine with aura, without mention of intractable migraine without mention of status migrainosus   . Carpal tunnel syndrome   . Otosclerosis of both ears   . Hearing loss   . Peripheral vascular disease     Bilateral femoral bruit  . Internal hemorrhoids   . Allergic rhinitis   . Shoulder pain   . Degenerative disc disease   . Neck pain   . Rotator cuff syndrome of left shoulder   . Plantar fasciitis   . Scoliosis   . Abnormal EKG     Left atrial abnormality    History  Substance Use Topics  . Smoking status: Never Smoker   . Smokeless tobacco: Not on file  . Alcohol Use: No    History reviewed. No pertinent family history.  Allergies  Allergen Reactions  . Amoxicillin   . Codeine   . Doxycycline     Current outpatient prescriptions:aspirin 81 MG tablet, Take 81 mg by mouth daily., Disp: , Rfl: ;  butalbital-acetaminophen-caffeine (FIORICET) 50-325-40 MG per tablet, Take one to two tablets up to four times daily for headaches, Disp: 30 tablet, Rfl: 1;  cephALEXin (KEFLEX) 500 MG  capsule, Take 1 capsule (500 mg total) by mouth 3 (three) times daily., Disp: 12 capsule, Rfl: 0 fish oil-omega-3 fatty acids 1000 MG capsule, Take 1 g by mouth daily. , Disp: , Rfl: ;  L-Methylfolate-B12-B6-B2 (METAFOLBIC) 04-28-49-5 MG TABS, Take 1 tablet by mouth 2 (two) times daily., Disp: 30 tablet, Rfl: 5;  Multiple Vitamins-Minerals (MULTIVITAMIN PO), Take by mouth., Disp: , Rfl: ;  predniSONE (DELTASONE) 5 MG tablet, Take 5 mg by mouth daily., Disp: , Rfl: ;  rosuvastatin (CRESTOR) 5 MG tablet, Take 5 mg by mouth daily., Disp: , Rfl:  VITAMIN D, CHOLECALCIFEROL, PO, Take by mouth., Disp: , Rfl: ;  HYDROmorphone (DILAUDID) 2 MG tablet, Take 1 tablet (2 mg total) by mouth every 4 (four) hours as needed., Disp: 30 tablet, Rfl: 0;  oseltamivir (TAMIFLU) 75 MG capsule, Take one tablet once daily for flu prophylactic, Disp: 14 capsule, Rfl: 0  BP 135/84  Pulse 69  Resp 16  Ht 6' (1.829 m)  Wt 180 lb (81.647 kg)  BMI 24.41 kg/m2  Body mass index is 24.41 kg/(m^2).          Review of Systems denies chest pain, dyspnea on exertion, PND, orthopnea, wheezing, lateralizing weakness, aphasia, amaurosis fugax. It had recent surgery on his right hand for a contracture. Other systems are negative  and complete review of systems     Objective:   Physical Exam BP 135/84  Pulse 69  Resp 16  Ht 6' (1.829 m)  Wt 180 lb (81.647 kg)  BMI 24.41 kg/m2  Gen.-alert and oriented x3 in no apparent distress HEENT normal for age Lungs no rhonchi or wheezing Cardiovascular regular rhythm no murmurs carotid pulses 3+ palpable no bruits audible Abdomen soft nontender -4 cm pulsatile mass noted. Musculoskeletal free of  major deformities Skin clear -no rashes Neurologic normal Lower extremities 3+ femoral and dorsalis pedis pulses palpable bilaterally with no edema   Today I ordered a duplex scan of the abdominal aorta which I reviewed and interpreted. The aneurysm measures 4 point one 9 x 4 point one 2  cm in maximum diameter.      Assessment:     Slowly enlarging infrarenal abdominal aortic aneurysm currently 4 point one 9 cm-has grown 0.6 cm in 5 years    Plan:     Return in one year for repeat duplex scan Discussed symptoms of rupture or leakage with patient including flank and back discomfort and need to report to emergency department if this occurs We'll continue to monitor on annual basis

## 2014-01-16 ENCOUNTER — Other Ambulatory Visit: Payer: Self-pay | Admitting: *Deleted

## 2014-01-16 MED ORDER — METAFOLBIC 6-1-50-5 MG PO TABS: 1.0000 | ORAL_TABLET | Freq: Two times a day (BID) | ORAL | Status: DC

## 2014-01-17 DIAGNOSIS — M76899 Other specified enthesopathies of unspecified lower limb, excluding foot: Secondary | ICD-10-CM | POA: Diagnosis not present

## 2014-01-27 DIAGNOSIS — Z85828 Personal history of other malignant neoplasm of skin: Secondary | ICD-10-CM | POA: Diagnosis not present

## 2014-01-27 DIAGNOSIS — L819 Disorder of pigmentation, unspecified: Secondary | ICD-10-CM | POA: Diagnosis not present

## 2014-01-27 DIAGNOSIS — D1801 Hemangioma of skin and subcutaneous tissue: Secondary | ICD-10-CM | POA: Diagnosis not present

## 2014-01-27 DIAGNOSIS — L821 Other seborrheic keratosis: Secondary | ICD-10-CM | POA: Diagnosis not present

## 2014-01-28 DIAGNOSIS — M25559 Pain in unspecified hip: Secondary | ICD-10-CM | POA: Diagnosis not present

## 2014-01-30 DIAGNOSIS — M25559 Pain in unspecified hip: Secondary | ICD-10-CM | POA: Diagnosis not present

## 2014-02-04 DIAGNOSIS — M25559 Pain in unspecified hip: Secondary | ICD-10-CM | POA: Diagnosis not present

## 2014-02-06 DIAGNOSIS — M25559 Pain in unspecified hip: Secondary | ICD-10-CM | POA: Diagnosis not present

## 2014-02-07 ENCOUNTER — Other Ambulatory Visit: Payer: Self-pay | Admitting: *Deleted

## 2014-02-07 DIAGNOSIS — E785 Hyperlipidemia, unspecified: Secondary | ICD-10-CM

## 2014-02-07 DIAGNOSIS — I714 Abdominal aortic aneurysm, without rupture, unspecified: Secondary | ICD-10-CM

## 2014-02-11 DIAGNOSIS — M25559 Pain in unspecified hip: Secondary | ICD-10-CM | POA: Diagnosis not present

## 2014-02-14 ENCOUNTER — Other Ambulatory Visit: Payer: Medicare Other

## 2014-02-14 DIAGNOSIS — I714 Abdominal aortic aneurysm, without rupture, unspecified: Secondary | ICD-10-CM

## 2014-02-14 DIAGNOSIS — E785 Hyperlipidemia, unspecified: Secondary | ICD-10-CM | POA: Diagnosis not present

## 2014-02-15 LAB — LIPID PANEL
Chol/HDL Ratio: 2.7 ratio units (ref 0.0–5.0)
Cholesterol, Total: 156 mg/dL (ref 100–199)
HDL: 57 mg/dL (ref >39)
LDL CALC: 84 mg/dL (ref 0–99)
Triglycerides: 76 mg/dL (ref 0–149)
VLDL CHOLESTEROL CAL: 15 mg/dL (ref 5–40)

## 2014-02-15 LAB — CBC WITH DIFFERENTIAL/PLATELET
Basophils Absolute: 0 10*3/uL (ref 0.0–0.2)
Basos: 1 %
EOS: 3 %
Eosinophils Absolute: 0.2 10*3/uL (ref 0.0–0.4)
HEMATOCRIT: 43.4 % (ref 37.5–51.0)
Hemoglobin: 14.5 g/dL (ref 12.6–17.7)
IMMATURE GRANULOCYTES: 0 %
Immature Grans (Abs): 0 10*3/uL (ref 0.0–0.1)
Lymphocytes Absolute: 1.6 10*3/uL (ref 0.7–3.1)
Lymphs: 22 %
MCH: 33.4 pg — ABNORMAL HIGH (ref 26.6–33.0)
MCHC: 33.4 g/dL (ref 31.5–35.7)
MCV: 100 fL — ABNORMAL HIGH (ref 79–97)
MONOCYTES: 11 %
Monocytes Absolute: 0.8 10*3/uL (ref 0.1–0.9)
Neutrophils Absolute: 4.8 10*3/uL (ref 1.4–7.0)
Neutrophils Relative %: 63 %
RBC: 4.34 x10E6/uL (ref 4.14–5.80)
RDW: 14.1 % (ref 12.3–15.4)
WBC: 7.5 10*3/uL (ref 3.4–10.8)

## 2014-02-15 LAB — COMPREHENSIVE METABOLIC PANEL
ALBUMIN: 4.1 g/dL (ref 3.5–4.7)
ALT: 25 IU/L (ref 0–44)
AST: 27 IU/L (ref 0–40)
Albumin/Globulin Ratio: 2.1 (ref 1.1–2.5)
Alkaline Phosphatase: 60 IU/L (ref 39–117)
BUN/Creatinine Ratio: 20 (ref 10–22)
BUN: 24 mg/dL (ref 8–27)
CHLORIDE: 101 mmol/L (ref 97–108)
CO2: 26 mmol/L (ref 18–29)
Calcium: 9.2 mg/dL (ref 8.6–10.2)
Creatinine, Ser: 1.18 mg/dL (ref 0.76–1.27)
GFR calc Af Amer: 67 mL/min/{1.73_m2} (ref >59)
GFR calc non Af Amer: 58 mL/min/{1.73_m2} — ABNORMAL LOW (ref >59)
GLUCOSE: 88 mg/dL (ref 65–99)
Globulin, Total: 2 g/dL (ref 1.5–4.5)
Potassium: 4.5 mmol/L (ref 3.5–5.2)
Sodium: 142 mmol/L (ref 134–144)
Total Bilirubin: 0.5 mg/dL (ref 0.0–1.2)
Total Protein: 6.1 g/dL (ref 6.0–8.5)

## 2014-02-18 ENCOUNTER — Ambulatory Visit (INDEPENDENT_AMBULATORY_CARE_PROVIDER_SITE_OTHER): Payer: Medicare Other | Admitting: Internal Medicine

## 2014-02-18 ENCOUNTER — Encounter: Payer: Self-pay | Admitting: Internal Medicine

## 2014-02-18 VITALS — BP 128/90 | HR 67 | Temp 97.7°F | Wt 189.0 lb

## 2014-02-18 DIAGNOSIS — I714 Abdominal aortic aneurysm, without rupture, unspecified: Secondary | ICD-10-CM

## 2014-02-18 DIAGNOSIS — E785 Hyperlipidemia, unspecified: Secondary | ICD-10-CM

## 2014-02-18 DIAGNOSIS — M353 Polymyalgia rheumatica: Secondary | ICD-10-CM | POA: Diagnosis not present

## 2014-02-18 DIAGNOSIS — R339 Retention of urine, unspecified: Secondary | ICD-10-CM

## 2014-02-18 DIAGNOSIS — Z125 Encounter for screening for malignant neoplasm of prostate: Secondary | ICD-10-CM

## 2014-02-18 DIAGNOSIS — N529 Male erectile dysfunction, unspecified: Secondary | ICD-10-CM | POA: Diagnosis not present

## 2014-02-18 DIAGNOSIS — R350 Frequency of micturition: Secondary | ICD-10-CM

## 2014-02-18 MED ORDER — ACYCLOVIR 400 MG PO TABS: ORAL_TABLET | ORAL | Status: DC

## 2014-02-18 NOTE — Patient Instructions (Signed)
Continue current medications. 

## 2014-02-18 NOTE — Progress Notes (Signed)
Patient ID: [name], male   DOB: 09-09-1930, 80 y.o.   MRN: [id number]    Location:    [facility]  Place of Service:  Office    Allergies  Allergen Reactions  . Amoxicillin   . Codeine   . Doxycycline     Chief Complaint  Patient presents with  . Medical Managment of Chronic Issues    6 month follow-up, discuss labs (copy printed)     HPI:  AAA (abdominal aortic aneurysm); asymptomatic. Slightly larger on the last [place]  Other and unspecified hyperlipidemia:improved on current medications  Polymyalgia rheumatica : unchanged. Relatively pain free  Impotence of organic origin: probable vascular origin. Has penile implant  Urinary frequency: mild  Incomplete bladder emptying -: related to frequency    Medications: Patient's Medications  New Prescriptions   No medications on file  Previous Medications   ACYCLOVIR (ZOVIRAX) 400 MG TABLET       ASPIRIN 81 MG TABLET    Take 81 mg by mouth daily.   BUTALBITAL-ACETAMINOPHEN-CAFFEINE (FIORICET) 50-325-40 MG PER TABLET    Take one to two tablets up to four times daily for headaches   CEPHALEXIN (KEFLEX) 500 MG CAPSULE    Take 1 capsule (500 mg total) by mouth 3 (three) times daily.   FISH OIL-OMEGA-3 FATTY ACIDS 1000 MG CAPSULE    Take 1 g by mouth daily.    HYDROMORPHONE (DILAUDID) 2 MG TABLET    Take 1 tablet (2 mg total) by mouth every 4 (four) hours as needed.   L-METHYLFOLATE-B12-B6-B2 (METAFOLBIC) 04-28-49-5 MG TABS    Take 1 tablet by mouth 2 (two) times daily.   MULTIPLE VITAMINS-MINERALS (MULTIVITAMIN PO)    Take by mouth.   PREDNISONE (DELTASONE) 5 MG TABLET    Take 5 mg by mouth daily.   ROSUVASTATIN (CRESTOR) 5 MG TABLET    Take 5 mg by mouth daily.   VITAMIN D, CHOLECALCIFEROL, PO    Take by mouth.  Modified Medications   No medications on file  Discontinued Medications   OSELTAMIVIR (TAMIFLU) 75 MG CAPSULE    Take one tablet once daily for flu prophylactic     Review of Systems  Constitutional: Negative for  fever, activity change, appetite change, fatigue and unexpected weight change.  HENT: Positive for hearing loss.   Eyes: Positive for visual disturbance (corrective lenses).  Respiratory: Negative.   Cardiovascular: Negative for chest pain, palpitations and leg swelling.       Hx AAA  Gastrointestinal: Negative.   Endocrine: Negative.   Genitourinary: Positive for frequency.       Penile implant  Musculoskeletal: Negative.   Allergic/Immunologic: Negative.   Neurological: Negative.   Hematological: Negative.   Psychiatric/Behavioral: Negative.     Filed Vitals:   02/18/14 1331  BP: 128/90  Pulse: 67  Temp: 97.7 F (36.5 C)  TempSrc: Oral  Weight: 189 lb (85.73 kg)  SpO2: 98%   Physical Exam  Constitutional: He is oriented to person, place, and time. He appears well-developed and well-nourished. No distress.  HENT:  Head: Normocephalic.  Right Ear: External ear normal.  Left Ear: External ear normal.  Nose: Nose normal.  Mouth/Throat: Oropharynx is clear and moist.  Partial hearing loss bilaterally.  Eyes: Conjunctivae and EOM are normal. Pupils are equal, round, and reactive to light.  Contact lenses.  Neck: Neck supple. No JVD present. No tracheal deviation present. No thyromegaly present.  Cardiovascular: Normal rate, regular rhythm, normal heart sounds and intact distal pulses.  Exam reveals no gallop and no friction rub.   No murmur heard. 1/4 bilateral femoral bruit. Lungs/4 abdominal aortic bruit. Increase pulsation at the abdominal aorta.  Pulmonary/Chest: No respiratory distress. He has no wheezes. He has no rales.  Abdominal: He exhibits no distension and no mass. There is no tenderness.  Genitourinary: Rectum normal and prostate normal. Guaiac negative stool. No penile tenderness.  Penile implant  Musculoskeletal: Normal range of motion. He exhibits no edema and no tenderness.  Scoliosis  Neurological: He is alert and oriented to person, place, and time. He  displays normal reflexes. No cranial nerve deficit. Coordination normal.  Skin: No rash noted. He is not diaphoretic. No erythema. No pallor.  Psychiatric: He has a normal mood and affect. His behavior is normal. Judgment and thought content normal.     Labs reviewed: Appointment on 02/14/2014  Component Date Value Ref Range Status  . Cholesterol, Total 02/14/2014 156  100 - 199 mg/dL Final  . Triglycerides 02/14/2014 76  0 - 149 mg/dL Final  . HDL 02/14/2014 57  >39 mg/dL Final   Comment: According to ATP-III Guidelines, HDL-C >59 mg/dL is considered a                          negative risk factor for CHD.  Marland Kitchen VLDL Cholesterol Cal 02/14/2014 15  5 - 40 mg/dL Final  . LDL Calculated 02/14/2014 84  0 - 99 mg/dL Final  . Chol/HDL Ratio 02/14/2014 2.7  0.0 - 5.0 ratio units Final   Comment:                                   T. Chol/HDL Ratio                                                                      Men  Women                                                        1/2 Avg.Risk  3.4    3.3                                                            Avg.Risk  5.0    4.4                                                         2X Avg.Risk  9.6    7.1  3X Avg.Risk 23.4   11.0  . Glucose 02/14/2014 88  65 - 99 mg/dL Final  . BUN 02/14/2014 24  8 - 27 mg/dL Final  . Creatinine, Ser 02/14/2014 1.18  0.76 - 1.27 mg/dL Final  . GFR calc non Af Amer 02/14/2014 58* >59 mL/min/1.73 Final  . GFR calc Af Amer 02/14/2014 67  >59 mL/min/1.73 Final  . BUN/Creatinine Ratio 02/14/2014 20  10 - 22 Final  . Sodium 02/14/2014 142  134 - 144 mmol/L Final  . Potassium 02/14/2014 4.5  3.5 - 5.2 mmol/L Final  . Chloride 02/14/2014 101  97 - 108 mmol/L Final  . CO2 02/14/2014 26  18 - 29 mmol/L Final  . Calcium 02/14/2014 9.2  8.6 - 10.2 mg/dL Final  . Total Protein 02/14/2014 6.1  6.0 - 8.5 g/dL Final  . Albumin 02/14/2014 4.1  3.5 - 4.7 g/dL Final    . Globulin, Total 02/14/2014 2.0  1.5 - 4.5 g/dL Final  . Albumin/Globulin Ratio 02/14/2014 2.1  1.1 - 2.5 Final  . Total Bilirubin 02/14/2014 0.5  0.0 - 1.2 mg/dL Final  . Alkaline Phosphatase 02/14/2014 60  39 - 117 IU/L Final  . AST 02/14/2014 27  0 - 40 IU/L Final  . ALT 02/14/2014 25  0 - 44 IU/L Final  . WBC 02/14/2014 7.5  3.4 - 10.8 x10E3/uL Final  . RBC 02/14/2014 4.34  4.14 - 5.80 x10E6/uL Final  . Hemoglobin 02/14/2014 14.5  12.6 - 17.7 g/dL Final  . HCT 02/14/2014 43.4  37.5 - 51.0 % Final  . MCV 02/14/2014 100* 79 - 97 fL Final  . MCH 02/14/2014 33.4* 26.6 - 33.0 pg Final  . MCHC 02/14/2014 33.4  31.5 - 35.7 g/dL Final  . RDW 02/14/2014 14.1  12.3 - 15.4 % Final  . Neutrophils Relative % 02/14/2014 63   Final  . Lymphs 02/14/2014 22   Final  . Monocytes 02/14/2014 11   Final  . Eos 02/14/2014 3   Final  . Basos 02/14/2014 1   Final  . Neutrophils Absolute 02/14/2014 4.8  1.4 - 7.0 x10E3/uL Final  . Lymphocytes Absolute 02/14/2014 1.6  0.7 - 3.1 x10E3/uL Final  . Monocytes Absolute 02/14/2014 0.8  0.1 - 0.9 x10E3/uL Final  . Eosinophils Absolute 02/14/2014 0.2  0.0 - 0.4 x10E3/uL Final  . Basophils Absolute 02/14/2014 0.0  0.0 - 0.2 x10E3/uL Final  . Immature Granulocytes 02/14/2014 0   Final  . Immature Grans (Abs) 02/14/2014 0.0  0.0 - 0.1 x10E3/uL Final  Admission on 12/05/2013, Discharged on 12/05/2013  Component Date Value Ref Range Status  . Sodium 12/02/2013 139  137 - 147 mEq/L Final  . Potassium 12/02/2013 4.4  3.7 - 5.3 mEq/L Final  . Chloride 12/02/2013 102  96 - 112 mEq/L Final  . CO2 12/02/2013 25  19 - 32 mEq/L Final  . Glucose, Bld 12/02/2013 82  70 - 99 mg/dL Final  . BUN 12/02/2013 20  6 - 23 mg/dL Final  . Creatinine, Ser 12/02/2013 1.02  0.50 - 1.35 mg/dL Final  . Calcium 12/02/2013 8.9  8.4 - 10.5 mg/dL Final  . GFR calc non Af Amer 12/02/2013 67* >90 mL/min Final  . GFR calc Af Amer 12/02/2013 77* >90 mL/min Final   Comment: (NOTE)                           The eGFR has been calculated using the CKD EPI  equation.                          This calculation has not been validated in all clinical situations.                          eGFR's persistently <90 mL/min signify possible Chronic Kidney                          Disease.  Marland Kitchen Hemoglobin 12/05/2013 15.3  13.0 - 17.0 g/dL Final      Assessment/Plan  1. AAA (abdominal aortic aneurysm) Continue xcreening  2.hyperlipidemia - Lipid panel; Future  3. Polymyalgia rheumatica continue prednisone - Comprehensive metabolic panel; Future  4. Impotence of organic origin Penile implant  5. Special screening for malignant neoplasm of prostate PSA  6. Urinary frequency PSA  7. Incomplete bladder emptying - PSA; Future

## 2014-02-25 ENCOUNTER — Other Ambulatory Visit: Payer: Self-pay | Admitting: Internal Medicine

## 2014-02-25 DIAGNOSIS — J209 Acute bronchitis, unspecified: Secondary | ICD-10-CM

## 2014-02-25 MED ORDER — AZITHROMYCIN 250 MG PO TABS: ORAL_TABLET | ORAL | Status: DC

## 2014-02-25 MED ORDER — DOXYCYCLINE HYCLATE 100 MG PO TABS: ORAL_TABLET | ORAL | Status: DC

## 2014-03-10 ENCOUNTER — Other Ambulatory Visit: Payer: Self-pay | Admitting: *Deleted

## 2014-03-10 MED ORDER — BUTALBITAL-APAP-CAFFEINE 50-325-40 MG PO TABS: ORAL_TABLET | ORAL | Status: DC

## 2014-03-10 NOTE — Telephone Encounter (Signed)
[name] 

## 2014-03-11 DIAGNOSIS — M545 Low back pain, unspecified: Secondary | ICD-10-CM | POA: Diagnosis not present

## 2014-03-11 DIAGNOSIS — IMO0002 Reserved for concepts with insufficient information to code with codable children: Secondary | ICD-10-CM | POA: Diagnosis not present

## 2014-03-11 DIAGNOSIS — M48061 Spinal stenosis, lumbar region without neurogenic claudication: Secondary | ICD-10-CM | POA: Diagnosis not present

## 2014-03-12 ENCOUNTER — Other Ambulatory Visit: Payer: Self-pay | Admitting: Orthopedic Surgery

## 2014-03-12 ENCOUNTER — Ambulatory Visit
Admission: RE | Admit: 2014-03-12 | Discharge: 2014-03-12 | Disposition: A | Discharge status: Home / Self Care | Payer: Medicare Other | Source: Ambulatory Visit | Attending: Orthopedic Surgery | Admitting: Orthopedic Surgery

## 2014-03-12 DIAGNOSIS — M48061 Spinal stenosis, lumbar region without neurogenic claudication: Secondary | ICD-10-CM | POA: Diagnosis not present

## 2014-03-12 DIAGNOSIS — M545 Low back pain, unspecified: Secondary | ICD-10-CM

## 2014-03-12 DIAGNOSIS — M5410 Radiculopathy, site unspecified: Secondary | ICD-10-CM

## 2014-03-14 DIAGNOSIS — M48061 Spinal stenosis, lumbar region without neurogenic claudication: Secondary | ICD-10-CM | POA: Diagnosis not present

## 2014-03-19 ENCOUNTER — Encounter: Payer: Self-pay | Admitting: Internal Medicine

## 2014-03-25 DIAGNOSIS — M48061 Spinal stenosis, lumbar region without neurogenic claudication: Secondary | ICD-10-CM | POA: Diagnosis not present

## 2014-04-09 ENCOUNTER — Ambulatory Visit (INDEPENDENT_AMBULATORY_CARE_PROVIDER_SITE_OTHER): Payer: Medicare Other | Admitting: Internal Medicine

## 2014-04-09 DIAGNOSIS — H2510 Age-related nuclear cataract, unspecified eye: Secondary | ICD-10-CM | POA: Diagnosis not present

## 2014-04-09 DIAGNOSIS — H6120 Impacted cerumen, unspecified ear: Secondary | ICD-10-CM | POA: Diagnosis not present

## 2014-04-09 DIAGNOSIS — H17829 Peripheral opacity of cornea, unspecified eye: Secondary | ICD-10-CM | POA: Diagnosis not present

## 2014-04-09 NOTE — Progress Notes (Signed)
Patient ID: [name], male   DOB: [birth year], 80 y.o.   MRN: [id number]    Location:  [facility]   Place of Service: OFFICE    Allergies  Allergen Reactions  . Amoxicillin   . Codeine   . Doxycycline     Chief Complaint  Patient presents with  . nurse visit    ear lavage @ Dr Rolly Salter request    HPI:  Left ear has lost hearing acuity. No pain. Hx wax occlusions.  Medications: Patient's Medications  New Prescriptions   No medications on file  Previous Medications   ACYCLOVIR (ZOVIRAX) 400 MG TABLET    Take twice daily due to herpes infection   ASPIRIN 81 MG TABLET    Take 81 mg by mouth daily.   AZITHROMYCIN (ZITHROMAX) 250 MG TABLET    2 initially, then 1 each day thereafter for infection   BUTALBITAL-ACETAMINOPHEN-CAFFEINE (FIORICET) 50-325-40 MG PER TABLET    Take one to two tablets up to four times daily for headaches   DOXYCYCLINE (VIBRA-TABS) 100 MG TABLET    One twice daily for infection   FISH OIL-OMEGA-3 FATTY ACIDS 1000 MG CAPSULE    Take 1 g by mouth daily.    HYDROMORPHONE (DILAUDID) 2 MG TABLET    Take 1 tablet (2 mg total) by mouth every 4 (four) hours as needed.   L-METHYLFOLATE-B12-B6-B2 (METAFOLBIC) 04-28-49-5 MG TABS    Take 1 tablet by mouth 2 (two) times daily.   MULTIPLE VITAMINS-MINERALS (MULTIVITAMIN PO)    Take by mouth.   PREDNISONE (DELTASONE) 5 MG TABLET    Take 5 mg by mouth daily.   ROSUVASTATIN (CRESTOR) 5 MG TABLET    Take 5 mg by mouth daily.   VITAMIN D, CHOLECALCIFEROL, PO    Take by mouth.  Modified Medications   No medications on file  Discontinued Medications   No medications on file     Review of Systems  HENT: Positive for hearing loss. Negative for ear pain, rhinorrhea, sinus pressure and sore throat.   Respiratory: Negative for cough and shortness of breath.   Gastrointestinal: Negative for nausea.    There were no vitals filed for this visit. There is no weight on file to calculate BMI.  Physical Exam  HENT:  Right ear  normal. Left EAC has wax occlusion.     Labs reviewed: Appointment on 02/14/2014  Component Date Value Ref Range Status  . Cholesterol, Total 02/14/2014 156  100 - 199 mg/dL Final  . Triglycerides 02/14/2014 76  0 - 149 mg/dL Final  . HDL 02/14/2014 57  >39 mg/dL Final   Comment: According to ATP-III Guidelines, HDL-C >59 mg/dL is considered a                          negative risk factor for CHD.  Marland Kitchen VLDL Cholesterol Cal 02/14/2014 15  5 - 40 mg/dL Final  . LDL Calculated 02/14/2014 84  0 - 99 mg/dL Final  . Chol/HDL Ratio 02/14/2014 2.7  0.0 - 5.0 ratio units Final   Comment:                                   T. Chol/HDL Ratio  Men  Women                                                        1/2 Avg.Risk  3.4    3.3                                                            Avg.Risk  5.0    4.4                                                         2X Avg.Risk  9.6    7.1                                                         3X Avg.Risk 23.4   11.0  . Glucose 02/14/2014 88  65 - 99 mg/dL Final  . BUN 02/14/2014 24  8 - 27 mg/dL Final  . Creatinine, Ser 02/14/2014 1.18  0.76 - 1.27 mg/dL Final  . GFR calc non Af Amer 02/14/2014 58* >59 mL/min/1.73 Final  . GFR calc Af Amer 02/14/2014 67  >59 mL/min/1.73 Final  . BUN/Creatinine Ratio 02/14/2014 20  10 - 22 Final  . Sodium 02/14/2014 142  134 - 144 mmol/L Final  . Potassium 02/14/2014 4.5  3.5 - 5.2 mmol/L Final  . Chloride 02/14/2014 101  97 - 108 mmol/L Final  . CO2 02/14/2014 26  18 - 29 mmol/L Final  . Calcium 02/14/2014 9.2  8.6 - 10.2 mg/dL Final  . Total Protein 02/14/2014 6.1  6.0 - 8.5 g/dL Final  . Albumin 02/14/2014 4.1  3.5 - 4.7 g/dL Final  . Globulin, Total 02/14/2014 2.0  1.5 - 4.5 g/dL Final  . Albumin/Globulin Ratio 02/14/2014 2.1  1.1 - 2.5 Final  . Total Bilirubin 02/14/2014 0.5  0.0 - 1.2 mg/dL Final  . Alkaline Phosphatase 02/14/2014 60  39  - 117 IU/L Final  . AST 02/14/2014 27  0 - 40 IU/L Final  . ALT 02/14/2014 25  0 - 44 IU/L Final  . WBC 02/14/2014 7.5  3.4 - 10.8 x10E3/uL Final  . RBC 02/14/2014 4.34  4.14 - 5.80 x10E6/uL Final  . Hemoglobin 02/14/2014 14.5  12.6 - 17.7 g/dL Final  . HCT 02/14/2014 43.4  37.5 - 51.0 % Final  . MCV 02/14/2014 100* 79 - 97 fL Final  . MCH 02/14/2014 33.4* 26.6 - 33.0 pg Final  . MCHC 02/14/2014 33.4  31.5 - 35.7 g/dL Final  . RDW 02/14/2014 14.1  12.3 - 15.4 % Final  . Neutrophils Relative % 02/14/2014 63   Final  . Lymphs 02/14/2014 22   Final  . Monocytes 02/14/2014 11   Final  . Eos 02/14/2014 3   Final  . Basos 02/14/2014 1  Final  . Neutrophils Absolute 02/14/2014 4.8  1.4 - 7.0 x10E3/uL Final  . Lymphocytes Absolute 02/14/2014 1.6  0.7 - 3.1 x10E3/uL Final  . Monocytes Absolute 02/14/2014 0.8  0.1 - 0.9 x10E3/uL Final  . Eosinophils Absolute 02/14/2014 0.2  0.0 - 0.4 x10E3/uL Final  . Basophils Absolute 02/14/2014 0.0  0.0 - 0.2 x10E3/uL Final  . Immature Granulocytes 02/14/2014 0   Final  . Immature Grans (Abs) 02/14/2014 0.0  0.0 - 0.1 x10E3/uL Final      Assessment/Plan  Impacted cerumen: lavaged with warm water. Occlusion removed. TM and EAC normal after removal of occlusion.

## 2014-04-10 ENCOUNTER — Other Ambulatory Visit: Payer: Self-pay | Admitting: Internal Medicine

## 2014-04-10 DIAGNOSIS — M47817 Spondylosis without myelopathy or radiculopathy, lumbosacral region: Secondary | ICD-10-CM | POA: Diagnosis not present

## 2014-04-10 DIAGNOSIS — J029 Acute pharyngitis, unspecified: Secondary | ICD-10-CM

## 2014-04-10 MED ORDER — AZITHROMYCIN 250 MG PO TABS: ORAL_TABLET | ORAL | Status: DC

## 2014-04-24 DIAGNOSIS — M47817 Spondylosis without myelopathy or radiculopathy, lumbosacral region: Secondary | ICD-10-CM | POA: Diagnosis not present

## 2014-05-12 ENCOUNTER — Other Ambulatory Visit: Payer: Self-pay | Admitting: Orthopedic Surgery

## 2014-05-12 DIAGNOSIS — M549 Dorsalgia, unspecified: Secondary | ICD-10-CM

## 2014-05-14 ENCOUNTER — Other Ambulatory Visit: Payer: Medicare Other

## 2014-05-27 DIAGNOSIS — H348392 Tributary (branch) retinal vein occlusion, unspecified eye, stable: Secondary | ICD-10-CM | POA: Diagnosis not present

## 2014-05-27 DIAGNOSIS — H35379 Puckering of macula, unspecified eye: Secondary | ICD-10-CM | POA: Insufficient documentation

## 2014-05-27 DIAGNOSIS — H17829 Peripheral opacity of cornea, unspecified eye: Secondary | ICD-10-CM | POA: Diagnosis not present

## 2014-06-17 DIAGNOSIS — M545 Low back pain, unspecified: Secondary | ICD-10-CM | POA: Diagnosis not present

## 2014-07-21 ENCOUNTER — Other Ambulatory Visit: Payer: Self-pay | Admitting: *Deleted

## 2014-07-21 MED ORDER — METAFOLBIC 6-1-50-5 MG PO TABS: 1.0000 | ORAL_TABLET | Freq: Two times a day (BID) | ORAL | Status: DC

## 2014-07-21 NOTE — Telephone Encounter (Signed)
[facility] 

## 2014-08-19 ENCOUNTER — Other Ambulatory Visit: Payer: Self-pay | Admitting: Internal Medicine

## 2014-08-19 DIAGNOSIS — M353 Polymyalgia rheumatica: Secondary | ICD-10-CM

## 2014-08-19 MED ORDER — PREDNISONE 5 MG PO TABS: 5.0000 mg | ORAL_TABLET | Freq: Every day | ORAL | Status: DC

## 2014-08-20 ENCOUNTER — Other Ambulatory Visit: Payer: Self-pay | Admitting: *Deleted

## 2014-08-20 DIAGNOSIS — E785 Hyperlipidemia, unspecified: Secondary | ICD-10-CM

## 2014-08-20 DIAGNOSIS — Z0000 Encounter for general adult medical examination without abnormal findings: Secondary | ICD-10-CM

## 2014-08-20 DIAGNOSIS — R972 Elevated prostate specific antigen [PSA]: Secondary | ICD-10-CM

## 2014-08-22 ENCOUNTER — Other Ambulatory Visit: Payer: Medicare Other

## 2014-08-22 DIAGNOSIS — R972 Elevated prostate specific antigen [PSA]: Secondary | ICD-10-CM | POA: Diagnosis not present

## 2014-08-22 DIAGNOSIS — E785 Hyperlipidemia, unspecified: Secondary | ICD-10-CM | POA: Diagnosis not present

## 2014-08-22 DIAGNOSIS — Z0000 Encounter for general adult medical examination without abnormal findings: Secondary | ICD-10-CM | POA: Diagnosis not present

## 2014-08-23 LAB — LIPID PANEL
CHOL/HDL RATIO: 2.6 ratio (ref 0.0–5.0)
Cholesterol, Total: 163 mg/dL (ref 100–199)
HDL: 62 mg/dL (ref >39)
LDL Calculated: 84 mg/dL (ref 0–99)
Triglycerides: 87 mg/dL (ref 0–149)
VLDL Cholesterol Cal: 17 mg/dL (ref 5–40)

## 2014-08-23 LAB — COMPREHENSIVE METABOLIC PANEL
ALK PHOS: 62 IU/L (ref 39–117)
ALT: 26 IU/L (ref 0–44)
AST: 29 IU/L (ref 0–40)
Albumin/Globulin Ratio: 1.8 (ref 1.1–2.5)
Albumin: 4.1 g/dL (ref 3.5–4.7)
BUN / CREAT RATIO: 17 (ref 10–22)
BUN: 21 mg/dL (ref 8–27)
CHLORIDE: 101 mmol/L (ref 97–108)
CO2: 26 mmol/L (ref 18–29)
Calcium: 9.1 mg/dL (ref 8.6–10.2)
Creatinine, Ser: 1.22 mg/dL (ref 0.76–1.27)
GFR calc Af Amer: 64 mL/min/{1.73_m2} (ref >59)
GFR calc non Af Amer: 55 mL/min/{1.73_m2} — ABNORMAL LOW (ref >59)
GLOBULIN, TOTAL: 2.3 g/dL (ref 1.5–4.5)
Glucose: 93 mg/dL (ref 65–99)
Potassium: 4.3 mmol/L (ref 3.5–5.2)
SODIUM: 141 mmol/L (ref 134–144)
Total Bilirubin: 0.7 mg/dL (ref 0.0–1.2)
Total Protein: 6.4 g/dL (ref 6.0–8.5)

## 2014-08-23 LAB — PSA: PSA: 4 ng/mL (ref 0.0–4.0)

## 2014-08-26 ENCOUNTER — Encounter: Payer: Self-pay | Admitting: Internal Medicine

## 2014-08-26 ENCOUNTER — Ambulatory Visit (INDEPENDENT_AMBULATORY_CARE_PROVIDER_SITE_OTHER): Payer: Medicare Other | Admitting: Internal Medicine

## 2014-08-26 VITALS — BP 110/74 | HR 53 | Temp 98.1°F | Resp 18 | Ht 72.0 in | Wt 188.0 lb

## 2014-08-26 DIAGNOSIS — M720 Palmar fascial fibromatosis [Dupuytren]: Secondary | ICD-10-CM | POA: Insufficient documentation

## 2014-08-26 DIAGNOSIS — I714 Abdominal aortic aneurysm, without rupture, unspecified: Secondary | ICD-10-CM | POA: Diagnosis not present

## 2014-08-26 DIAGNOSIS — M24549 Contracture, unspecified hand: Secondary | ICD-10-CM

## 2014-08-26 DIAGNOSIS — E785 Hyperlipidemia, unspecified: Secondary | ICD-10-CM | POA: Diagnosis not present

## 2014-08-26 DIAGNOSIS — M353 Polymyalgia rheumatica: Secondary | ICD-10-CM | POA: Diagnosis not present

## 2014-08-26 DIAGNOSIS — Z23 Encounter for immunization: Secondary | ICD-10-CM | POA: Diagnosis not present

## 2014-08-26 DIAGNOSIS — M24542 Contracture, left hand: Secondary | ICD-10-CM | POA: Insufficient documentation

## 2014-08-26 NOTE — Progress Notes (Signed)
Patient ID: [name], male   DOB: 1930-02-07, 80 y.o.   MRN: [id number]    Location:    [facility]    Place of Service:  OFFICE  Extended Emergency Contact Information Primary Emergency Contact: Dayal,Susan Address: [address]          [place], [place] [address] [place] Phone: [phone] Mobile Phone: [phone] Relation: Spouse   Chief Complaint  Patient presents with  . Annual Exam    HPI:   AAA (abdominal aortic aneurysm): stable. FU [place] in Dec 2015. No abd pain.  Other and unspecified hyperlipidemia: controlled  Polymyalgia rheumatica: remains on low dose prednisone.   Duypuytren's in left hand is uncomfortable. Prior surgical repair of contracture in the right hand.  Contracture of the left 5th finger.    Past Medical History  Diagnosis Date  . AAA (abdominal aortic aneurysm)   . Hyperlipidemia   . Polymyalgia rheumatica   . Benign neoplasm of colon 04/14/10    3 small polyps on colonoscopy by Dr. Henrene Pastor  . Impotence of organic origin     Penile implant  . Elevated prostate specific antigen (PSA)   . Disturbances metabolism of methionine, homocystine, and cystathionine     Elevated homocysteine  . Migraine with aura, without mention of intractable migraine without mention of status migrainosus   . Carpal tunnel syndrome   . Otosclerosis of both ears   . Hearing loss   . Peripheral vascular disease     Bilateral femoral bruit  . Internal hemorrhoids   . Allergic rhinitis   . Shoulder pain   . Degenerative disc disease   . Neck pain   . Rotator cuff syndrome of left shoulder   . Plantar fasciitis   . Scoliosis   . Abnormal EKG     Left atrial abnormality    Past Surgical History  Procedure Laterality Date  . Colonoscopy w/ polypectomy    . Stapedectomy Bilateral 1985, [place]  . Implant penile pump  2001  . Resection of appendix and tip of rectum  February 2005  . Actinic keratosis removal  06/21/2010     Left shoulder; Dr. Sarajane Jews  . Dupuytren contracture release Right 12/05/2013    Procedure: DUPUYTREN CONTRACTURE RELEASE RIGHT LONG, RING AND SMALL FINGERS;  Surgeon: Cammie Sickle., MD;  Location: [facility];  Service: Orthopedics;  Laterality: Right;    History   Social History  . Marital Status: Married    Spouse Name: Rod Majerus    Number of Children: 2  . Years of Education: N/A   Occupational History  . Environmental health practitioner    Social History Main Topics  . Smoking status: Never Smoker   . Smokeless tobacco: Not on file  . Alcohol Use: No  . Drug Use: No  . Sexual Activity: Not on file   Other Topics Concern  . 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     reports that he has never smoked. He does not have any smokeless tobacco history on file. He reports that he does not drink alcohol or use illicit drugs.  Immunization History  Administered Date(s) Administered  . Influenza,inj,Quad PF,36+ Mos 08/20/2013, 08/26/2014  . Influenza-Unspecified 09/22/2006, 09/26/2007, 10/13/2012  . Pneumococcal Polysaccharide-23 07/21/1998  . Tdap 06/29/2010    Allergies  Allergen Reactions  . Amoxicillin   .  Codeine   . Doxycycline     Medications: Patient's Medications  New Prescriptions   No medications on file  Previous Medications   ACYCLOVIR (ZOVIRAX) 400 MG TABLET    Take twice daily due to herpes infection   ASPIRIN 81 MG TABLET    Take 81 mg by mouth daily.   BUTALBITAL-ACETAMINOPHEN-CAFFEINE (FIORICET) 50-325-40 MG PER TABLET    Take one to two tablets up to four times daily for headaches   DOXYCYCLINE (VIBRA-TABS) 100 MG TABLET    One twice daily for infection   FISH OIL-OMEGA-3 FATTY ACIDS 1000 MG CAPSULE    Take 1 g by mouth daily.    L-METHYLFOLATE-B12-B6-B2 (METAFOLBIC) 04-28-49-5 MG TABS    Take 1 tablet by mouth 2 (two) times daily.   MULTIPLE  VITAMINS-MINERALS (MULTIVITAMIN PO)    Take by mouth.   PREDNISONE (DELTASONE) 5 MG TABLET    Take 1 tablet (5 mg total) by mouth daily.   ROSUVASTATIN (CRESTOR) 5 MG TABLET    Take 5 mg by mouth daily.   VITAMIN D, CHOLECALCIFEROL, PO    Take by mouth.  Modified Medications   No medications on file  Discontinued Medications   AZITHROMYCIN (ZITHROMAX) 250 MG TABLET    2 initially then 1 daily to treat infection   HYDROMORPHONE (DILAUDID) 2 MG TABLET    Take 1 tablet (2 mg total) by mouth every 4 (four) hours as needed.     Review of Systems  Constitutional: Negative for fever, activity change, appetite change, fatigue and unexpected weight change.  HENT: Positive for hearing loss. Negative for ear pain, rhinorrhea, sinus pressure and sore throat.   Eyes: Positive for visual disturbance (corrective lenses).  Respiratory: Negative.  Negative for cough and shortness of breath.   Cardiovascular: Negative for chest pain, palpitations and leg swelling.       Hx AAA  Gastrointestinal: Negative.  Negative for nausea.  Endocrine: Negative.   Genitourinary: Positive for frequency.       Penile implant  Musculoskeletal: Negative.        Aches in hands. Left Dupuytren's at [address] [facility]. Left 5th flexion contracture.Scars right hand from prior repair of Dupuytren's. Pain and swelling right thumb IP joint.  Allergic/Immunologic: Negative.   Neurological: Negative.   Hematological: Negative.   Psychiatric/Behavioral: Negative.     Filed Vitals:   08/26/14 1435  BP: 110/74  Pulse: 53  Temp: 98.1 F (36.7 C)  TempSrc: Oral  Resp: 18  Height: 6' (1.829 m)  Weight: 188 lb (85.276 kg)  SpO2: 96%   Body mass index is 25.49 kg/(m^2).  Physical Exam  Constitutional: He is oriented to person, place, and time. He appears well-developed and well-nourished. No distress.  HENT:  Head: Normocephalic.  Right Ear: External ear normal.  Left Ear: External ear normal.  Nose: Nose normal.  Mouth/Throat:  Oropharynx is clear and moist.  Right ear normal. Left EAC has wax occlusion.  Eyes: Conjunctivae and EOM are normal. Pupils are equal, round, and reactive to light.  Contact lenses.  Neck: Neck supple. No JVD present. No tracheal deviation present. No thyromegaly present.  Cardiovascular: Normal rate, regular rhythm, normal heart sounds and intact distal pulses.  Exam reveals no gallop and no friction rub.   No murmur heard. 1/4 bilateral femoral bruit. Lungs/4 abdominal aortic bruit. Increase pulsation at the abdominal aorta.  Pulmonary/Chest: No respiratory distress. He has no wheezes. He has no rales.  Abdominal: He exhibits no distension and no mass.  There is no tenderness.  Genitourinary: Rectum normal and prostate normal. Guaiac negative stool. No penile tenderness.  Penile implant  Musculoskeletal: Normal range of motion. He exhibits no edema and no tenderness.  Scoliosis Left Dupuytren's at [address] [facility]. Left 5th flexion contracture.Scars right hand from prior repair of Dupuytren's. Pain and swelling right thumb IP joint.  Neurological: He is alert and oriented to person, place, and time. He displays normal reflexes. No cranial nerve deficit. Coordination normal.  Skin: No rash noted. He is not diaphoretic. No erythema. No pallor.  Psychiatric: He has a normal mood and affect. His behavior is normal. Judgment and thought content normal.     Labs reviewed: Appointment on 08/22/2014  Component Date Value Ref Range Status  . Glucose 08/22/2014 93  65 - 99 mg/dL Final  . BUN 08/22/2014 21  8 - 27 mg/dL Final  . Creatinine, Ser 08/22/2014 1.22  0.76 - 1.27 mg/dL Final  . GFR calc non Af Amer 08/22/2014 55* >59 mL/min/1.73 Final  . GFR calc Af Amer 08/22/2014 64  >59 mL/min/1.73 Final  . BUN/Creatinine Ratio 08/22/2014 17  10 - 22 Final  . Sodium 08/22/2014 141  134 - 144 mmol/L Final  . Potassium 08/22/2014 4.3  3.5 - 5.2 mmol/L Final  . Chloride 08/22/2014 101  97 - 108 mmol/L Final    . CO2 08/22/2014 26  18 - 29 mmol/L Final  . Calcium 08/22/2014 9.1  8.6 - 10.2 mg/dL Final  . Total Protein 08/22/2014 6.4  6.0 - 8.5 g/dL Final  . Albumin 08/22/2014 4.1  3.5 - 4.7 g/dL Final  . Globulin, Total 08/22/2014 2.3  1.5 - 4.5 g/dL Final  . Albumin/Globulin Ratio 08/22/2014 1.8  1.1 - 2.5 Final  . Total Bilirubin 08/22/2014 0.7  0.0 - 1.2 mg/dL Final  . Alkaline Phosphatase 08/22/2014 62  39 - 117 IU/L Final  . AST 08/22/2014 29  0 - 40 IU/L Final  . ALT 08/22/2014 26  0 - 44 IU/L Final  . Cholesterol, Total 08/22/2014 163  100 - 199 mg/dL Final  . Triglycerides 08/22/2014 87  0 - 149 mg/dL Final  . HDL 08/22/2014 62  >39 mg/dL Final   Comment: According to ATP-III Guidelines, HDL-C >59 mg/dL is considered a                          negative risk factor for CHD.  Marland Kitchen VLDL Cholesterol Cal 08/22/2014 17  5 - 40 mg/dL Final  . LDL Calculated 08/22/2014 84  0 - 99 mg/dL Final  . Chol/HDL Ratio 08/22/2014 2.6  0.0 - 5.0 ratio units Final   Comment:                                   T. Chol/HDL Ratio                                                                      Men  Women  1/2 Avg.Risk  3.4    3.3                                                            Avg.Risk  5.0    4.4                                                         2X Avg.Risk  9.6    7.1                                                         3X Avg.Risk 23.4   11.0  . PSA 08/22/2014 4.0  0.0 - 4.0 ng/mL Final   Comment: Roche ECLIA methodology.                          According to the American Urological Association, Serum PSA should                          decrease and remain at undetectable levels after radical                          prostatectomy. The AUA defines biochemical recurrence as an initial                          PSA value 0.2 ng/mL or greater followed by a subsequent confirmatory                          PSA value 0.2 ng/mL or  greater.                          Values obtained with different assay methods or kits cannot be used                          interchangeably. Results cannot be interpreted as absolute evidence                          of the presence or absence of malignant disease.     Assessment/Plan  1. AAA (abdominal aortic aneurysm) [place] in Dec 2015  2. Other and unspecified hyperlipidemia controlled  3. Polymyalgia rheumatica controlled  4. Need for prophylactic vaccination and inoculation against influenza  5. Dupuytren's contracture of left hand See hand surgeon  6. Contracture of joint of finger of left hand See hand surgeon

## 2014-09-01 DIAGNOSIS — M65311 Trigger thumb, right thumb: Secondary | ICD-10-CM | POA: Diagnosis not present

## 2014-09-09 ENCOUNTER — Other Ambulatory Visit: Payer: Self-pay | Admitting: Internal Medicine

## 2014-09-17 DIAGNOSIS — M720 Palmar fascial fibromatosis [Dupuytren]: Secondary | ICD-10-CM | POA: Diagnosis not present

## 2014-09-17 DIAGNOSIS — M65332 Trigger finger, left middle finger: Secondary | ICD-10-CM | POA: Diagnosis not present

## 2014-09-17 DIAGNOSIS — M19042 Primary osteoarthritis, left hand: Secondary | ICD-10-CM | POA: Diagnosis not present

## 2014-10-28 ENCOUNTER — Other Ambulatory Visit: Payer: Self-pay | Admitting: Internal Medicine

## 2014-11-08 ENCOUNTER — Other Ambulatory Visit: Payer: Self-pay | Admitting: Internal Medicine

## 2014-12-01 DIAGNOSIS — D1801 Hemangioma of skin and subcutaneous tissue: Secondary | ICD-10-CM | POA: Diagnosis not present

## 2014-12-01 DIAGNOSIS — Z85828 Personal history of other malignant neoplasm of skin: Secondary | ICD-10-CM | POA: Diagnosis not present

## 2014-12-01 DIAGNOSIS — L821 Other seborrheic keratosis: Secondary | ICD-10-CM | POA: Diagnosis not present

## 2014-12-04 DIAGNOSIS — M65311 Trigger thumb, right thumb: Secondary | ICD-10-CM | POA: Diagnosis not present

## 2015-01-13 ENCOUNTER — Ambulatory Visit: Payer: Medicare Other | Admitting: Vascular Surgery

## 2015-01-13 ENCOUNTER — Other Ambulatory Visit (HOSPITAL_COMMUNITY): Payer: Medicare Other

## 2015-01-17 ENCOUNTER — Other Ambulatory Visit: Payer: Self-pay | Admitting: Internal Medicine

## 2015-01-19 ENCOUNTER — Encounter: Payer: Self-pay | Admitting: Vascular Surgery

## 2015-01-20 ENCOUNTER — Ambulatory Visit (INDEPENDENT_AMBULATORY_CARE_PROVIDER_SITE_OTHER): Payer: Medicare Other | Admitting: Vascular Surgery

## 2015-01-20 ENCOUNTER — Ambulatory Visit (HOSPITAL_COMMUNITY)
Admission: RE | Admit: 2015-01-20 | Discharge: 2015-01-20 | Disposition: A | Discharge status: Home / Self Care | Payer: Medicare Other | Source: Ambulatory Visit | Attending: Vascular Surgery | Admitting: Vascular Surgery

## 2015-01-20 VITALS — BP 119/72 | HR 72 | Resp 16 | Ht 72.0 in | Wt 180.0 lb

## 2015-01-20 DIAGNOSIS — I714 Abdominal aortic aneurysm, without rupture, unspecified: Secondary | ICD-10-CM

## 2015-01-20 NOTE — Progress Notes (Signed)
Subjective:     Patient ID: [name], male   DOB: May 29, 1930, 81 y.o.   MRN: [id number]  HPI this 81 year old male is seen for follow-up regarding his known abdominal aortic aneurysm which I've been following since 2011. Aneurysm was initially 3.6 cm on today's ultrasound reveals it to be 4.2 cm in maximum diameter. He has had no vascular problems. He denies chest pain, dyspnea on exertion, PND, orthopnea, claudication, lateralizing weakness, aphasia, amaurosis fugax, diplopia, blurred vision, or syncope. He takes one aspirin per day.  Past Medical History  Diagnosis Date  . AAA (abdominal aortic aneurysm)   . Hyperlipidemia   . Polymyalgia rheumatica   . Benign neoplasm of colon 04/14/10    3 small polyps on colonoscopy by Dr. Henrene Pastor  . Impotence of organic origin     Penile implant  . Elevated prostate specific antigen (PSA)   . Disturbances metabolism of methionine, homocystine, and cystathionine     Elevated homocysteine  . Migraine with aura, without mention of intractable migraine without mention of status migrainosus   . Carpal tunnel syndrome   . Otosclerosis of both ears   . Hearing loss   . Peripheral vascular disease     Bilateral femoral bruit  . Internal hemorrhoids   . Allergic rhinitis   . Shoulder pain   . Degenerative disc disease   . Neck pain   . Rotator cuff syndrome of left shoulder   . Plantar fasciitis   . Scoliosis   . Abnormal EKG     Left atrial abnormality    History  Substance Use Topics  . Smoking status: Never Smoker   . Smokeless tobacco: Not on file  . Alcohol Use: No    Family History  Problem Relation Age of Onset  . Heart disease Mother   . Emphysema Mother     Allergies  Allergen Reactions  . Amoxicillin   . Codeine   . Doxycycline      Current outpatient prescriptions:  .  acyclovir (ZOVIRAX) 400 MG tablet, Take twice daily due to herpes infection, Disp: 60 tablet, Rfl: 12 .  aspirin 81 MG tablet, Take 81 mg by mouth  daily., Disp: , Rfl:  .  butalbital-acetaminophen-caffeine (FIORICET, ESGIC) 50-325-40 MG per tablet, TAKE 1 OR 2 TABLETS UPTO 4 TIMES DAILY, Disp: 30 tablet, Rfl: 1 .  CRESTOR 5 MG tablet, TAKE ONE TABLET EACH DAY TO REDUCE CHOLESTEROL, Disp: 90 tablet, Rfl: 1 .  doxycycline (VIBRA-TABS) 100 MG tablet, One twice daily for infection, Disp: 20 tablet, Rfl: 0 .  fish oil-omega-3 fatty acids 1000 MG capsule, Take 1 g by mouth daily. , Disp: , Rfl:  .  L-Methylfolate-B12-B6-B2 (METAFOLBIC) 04-28-49-5 MG TABS, TAKE ONE TABLET TWICE DAILY, Disp: 60 tablet, Rfl: 5 .  Multiple Vitamins-Minerals (MULTIVITAMIN PO), Take by mouth., Disp: , Rfl:  .  predniSONE (DELTASONE) 5 MG tablet, Take 1 tablet (5 mg total) by mouth daily., Disp: 90 tablet, Rfl: 3 .  VITAMIN D, CHOLECALCIFEROL, PO, Take by mouth., Disp: , Rfl:   BP 119/72 mmHg  Pulse 72  Resp 16  Ht 6' (1.829 m)  Wt 180 lb (81.647 kg)  BMI 24.41 kg/m2  Body mass index is 24.41 kg/(m^2).           Review of Systems see history of present illness remainder of review of systems unremarkable     Objective:   Physical Exam BP 119/72 mmHg  Pulse 72  Resp 16  Ht 6' (  1.829 m)  Wt 180 lb (81.647 kg)  BMI 24.41 kg/m2  Gen.-alert and oriented x3 in no apparent distress HEENT normal for age Lungs no rhonchi or wheezing Cardiovascular regular rhythm no murmurs carotid pulses 3+ palpable no bruits audible Abdomen soft nontender-4 cm pulsatile mass-nontender Musculoskeletal free of  major deformities Skin clear -no rashes Neurologic normal Lower extremities 3+ femoral and dorsalis pedis pulses palpable bilaterally with no edema  Today I ordered a duplex scan of the abdominal aorta which I reviewed and interpreted. The maximum diameter of the aneurysm is 4.2 x 3.8 cm slightly larger than last year      Assessment:     Abdominal aortic aneurysm-stable at 4.2 cm in maximum diameter    Plan:     Return in one year for follow-up duplex  exam

## 2015-01-21 NOTE — Addendum Note (Signed)
Addended by: Mena Goes on: 01/21/2015 01:52 PM   Modules accepted: Orders

## 2015-02-20 ENCOUNTER — Other Ambulatory Visit: Payer: Self-pay | Admitting: Internal Medicine

## 2015-02-24 ENCOUNTER — Ambulatory Visit: Payer: Medicare Other | Admitting: Internal Medicine

## 2015-03-05 ENCOUNTER — Encounter: Payer: Self-pay | Admitting: Internal Medicine

## 2015-03-11 ENCOUNTER — Encounter: Payer: Self-pay | Admitting: Internal Medicine

## 2015-03-11 ENCOUNTER — Ambulatory Visit (INDEPENDENT_AMBULATORY_CARE_PROVIDER_SITE_OTHER): Payer: Medicare Other | Admitting: Internal Medicine

## 2015-03-11 VITALS — BP 118/70 | HR 62 | Temp 98.0°F | Ht 72.0 in | Wt 184.4 lb

## 2015-03-11 DIAGNOSIS — I714 Abdominal aortic aneurysm, without rupture, unspecified: Secondary | ICD-10-CM

## 2015-03-11 DIAGNOSIS — E785 Hyperlipidemia, unspecified: Secondary | ICD-10-CM | POA: Diagnosis not present

## 2015-03-11 DIAGNOSIS — M353 Polymyalgia rheumatica: Secondary | ICD-10-CM

## 2015-03-11 NOTE — Progress Notes (Signed)
Patient ID: [name], male   DOB: 1930-08-04, 81 y.o.   MRN: [id number]    Facility  [facility]    Place of Service:   OFFICE    Allergies  Allergen Reactions  . Amoxicillin   . Codeine   . Doxycycline     Chief Complaint  Patient presents with  . Medical Management of Chronic Issues    6 Month Follow up    HPI:  Advised him that I did not think he needs to have another routine coloscopy.   AAA (abdominal aortic aneurysm): stable  Polymyalgia rheumatica - asymptomatic on 5 mg prednisone  Hyperlipidemia - recheck next visit    Medications: Patient's Medications  New Prescriptions   No medications on file  Previous Medications   ACYCLOVIR (ZOVIRAX) 400 MG TABLET    TAKE ONE TABLET TWICE DAILY DUE TO HERPES INFECTION   ASPIRIN 81 MG TABLET    Take 81 mg by mouth daily.   BUTALBITAL-ACETAMINOPHEN-CAFFEINE (FIORICET, ESGIC) 50-325-40 MG PER TABLET    TAKE 1 OR 2 TABLETS UPTO 4 TIMES DAILY   CRESTOR 5 MG TABLET    TAKE ONE TABLET EACH DAY TO REDUCE CHOLESTEROL   FISH OIL-OMEGA-3 FATTY ACIDS 1000 MG CAPSULE    Take 1 g by mouth daily.    L-METHYLFOLATE-B12-B6-B2 (METAFOLBIC) 04-28-49-5 MG TABS    TAKE ONE TABLET TWICE DAILY   MULTIPLE VITAMINS-MINERALS (MULTIVITAMIN PO)    Take by mouth.   PREDNISONE (DELTASONE) 5 MG TABLET    Take 1 tablet (5 mg total) by mouth daily.   VITAMIN D, CHOLECALCIFEROL, PO    Take by mouth.  Modified Medications   No medications on file  Discontinued Medications   DOXYCYCLINE (VIBRA-TABS) 100 MG TABLET    One twice daily for infection     Review of Systems  Constitutional: Negative for fever, activity change, appetite change, fatigue and unexpected weight change.  HENT: Positive for hearing loss. Negative for ear pain, rhinorrhea, sinus pressure and sore throat.   Eyes: Positive for visual disturbance (corrective lenses).  Respiratory: Negative.  Negative for cough and shortness of breath.   Cardiovascular: Negative for chest pain,  palpitations and leg swelling.       Hx AAA  Gastrointestinal: Negative.  Negative for nausea.  Endocrine: Negative.   Genitourinary: Positive for frequency.       Penile implant  Musculoskeletal: Negative.        Aches in hands. Left Dupuytren's at [address] [facility]. Left 5th flexion contracture.Scars right hand from prior repair of Dupuytren's. Pain and swelling right thumb IP joint.  Allergic/Immunologic: Negative.   Neurological: Negative.   Hematological: Negative.   Psychiatric/Behavioral: Negative.     Filed Vitals:   03/11/15 1358  BP: 118/70  Pulse: 62  Temp: 98 F (36.7 C)  TempSrc: Oral  Height: 6' (1.829 m)  Weight: 184 lb 6.4 oz (83.643 kg)   Body mass index is 25 kg/(m^2).  Physical Exam  Constitutional: He is oriented to person, place, and time. He appears well-developed and well-nourished. No distress.  HENT:  Head: Normocephalic.  Right Ear: External ear normal.  Left Ear: External ear normal.  Nose: Nose normal.  Mouth/Throat: Oropharynx is clear and moist.  Right ear normal. Left EAC has wax occlusion.  Eyes: Conjunctivae and EOM are normal. Pupils are equal, round, and reactive to light.  Contact lenses.  Neck: Neck supple. No JVD present. No tracheal deviation present. No thyromegaly present.  Cardiovascular: Normal rate, regular  rhythm, normal heart sounds and intact distal pulses.  Exam reveals no gallop and no friction rub.   No murmur heard. 1/4 bilateral femoral bruit. Lungs/4 abdominal aortic bruit. Increase pulsation at the abdominal aorta.  Pulmonary/Chest: No respiratory distress. He has no wheezes. He has no rales.  Abdominal: He exhibits no distension and no mass. There is no tenderness.  Genitourinary: Rectum normal and prostate normal. Guaiac negative stool. No penile tenderness.  Penile implant  Musculoskeletal: Normal range of motion. He exhibits no edema or tenderness.  Scoliosis Left Dupuytren's at [address] [facility]. Left 5th flexion contracture.Scars  right hand from prior repair of Dupuytren's. Pain and swelling right thumb IP joint.  Neurological: He is alert and oriented to person, place, and time. He displays normal reflexes. No cranial nerve deficit. Coordination normal.  Skin: No rash noted. He is not diaphoretic. No erythema. No pallor.  Psychiatric: He has a normal mood and affect. His behavior is normal. Judgment and thought content normal.     Labs reviewed: No visits with results within 3 Month(s) from this visit. Latest known visit with results is:  Appointment on 08/22/2014  Component Date Value Ref Range Status  . Glucose 08/22/2014 93  65 - 99 mg/dL Final  . BUN 08/22/2014 21  8 - 27 mg/dL Final  . Creatinine, Ser 08/22/2014 1.22  0.76 - 1.27 mg/dL Final  . GFR calc non Af Amer 08/22/2014 55* >59 mL/min/1.73 Final  . GFR calc Af Amer 08/22/2014 64  >59 mL/min/1.73 Final  . BUN/Creatinine Ratio 08/22/2014 17  10 - 22 Final  . Sodium 08/22/2014 141  134 - 144 mmol/L Final  . Potassium 08/22/2014 4.3  3.5 - 5.2 mmol/L Final  . Chloride 08/22/2014 101  97 - 108 mmol/L Final  . CO2 08/22/2014 26  18 - 29 mmol/L Final  . Calcium 08/22/2014 9.1  8.6 - 10.2 mg/dL Final  . Total Protein 08/22/2014 6.4  6.0 - 8.5 g/dL Final  . Albumin 08/22/2014 4.1  3.5 - 4.7 g/dL Final  . Globulin, Total 08/22/2014 2.3  1.5 - 4.5 g/dL Final  . Albumin/Globulin Ratio 08/22/2014 1.8  1.1 - 2.5 Final  . Total Bilirubin 08/22/2014 0.7  0.0 - 1.2 mg/dL Final  . Alkaline Phosphatase 08/22/2014 62  39 - 117 IU/L Final  . AST 08/22/2014 29  0 - 40 IU/L Final  . ALT 08/22/2014 26  0 - 44 IU/L Final  . Cholesterol, Total 08/22/2014 163  100 - 199 mg/dL Final  . Triglycerides 08/22/2014 87  0 - 149 mg/dL Final  . HDL 08/22/2014 62  >39 mg/dL Final   Comment: According to ATP-III Guidelines, HDL-C >59 mg/dL is considered a                          negative risk factor for CHD.  Marland Kitchen VLDL Cholesterol Cal 08/22/2014 17  5 - 40 mg/dL Final  . LDL  Calculated 08/22/2014 84  0 - 99 mg/dL Final  . Chol/HDL Ratio 08/22/2014 2.6  0.0 - 5.0 ratio units Final   Comment:                                   T. Chol/HDL Ratio  Men  Women                                                        1/2 Avg.Risk  3.4    3.3                                                            Avg.Risk  5.0    4.4                                                         2X Avg.Risk  9.6    7.1                                                         3X Avg.Risk 23.4   11.0  . PSA 08/22/2014 4.0  0.0 - 4.0 ng/mL Final   Comment: Roche ECLIA methodology.                          According to the American Urological Association, Serum PSA should                          decrease and remain at undetectable levels after radical                          prostatectomy. The AUA defines biochemical recurrence as an initial                          PSA value 0.2 ng/mL or greater followed by a subsequent confirmatory                          PSA value 0.2 ng/mL or greater.                          Values obtained with different assay methods or kits cannot be used                          interchangeably. Results cannot be interpreted as absolute evidence                          of the presence or absence of malignant disease.     Assessment/Plan  1. AAA (abdominal aortic aneurysm) stable  2. Polymyalgia rheumatica Continue prednisone - Comprehensive metabolic panel; Future  3. Hyperlipidemia Continue CRESTOR - Lipid panel; Future - EKG 12-Lead; Future

## 2015-04-29 DIAGNOSIS — H2513 Age-related nuclear cataract, bilateral: Secondary | ICD-10-CM | POA: Diagnosis not present

## 2015-04-29 DIAGNOSIS — H17821 Peripheral opacity of cornea, right eye: Secondary | ICD-10-CM | POA: Diagnosis not present

## 2015-06-05 ENCOUNTER — Other Ambulatory Visit: Payer: Self-pay | Admitting: Internal Medicine

## 2015-06-10 ENCOUNTER — Other Ambulatory Visit: Payer: Self-pay | Admitting: Internal Medicine

## 2015-06-25 ENCOUNTER — Other Ambulatory Visit: Payer: Medicare Other

## 2015-06-25 DIAGNOSIS — M353 Polymyalgia rheumatica: Secondary | ICD-10-CM | POA: Diagnosis not present

## 2015-06-25 DIAGNOSIS — E785 Hyperlipidemia, unspecified: Secondary | ICD-10-CM | POA: Diagnosis not present

## 2015-06-26 LAB — COMPREHENSIVE METABOLIC PANEL
A/G RATIO: 1.7 (ref 1.1–2.5)
ALBUMIN: 3.9 g/dL (ref 3.5–4.7)
ALK PHOS: 69 IU/L (ref 39–117)
ALT: 25 IU/L (ref 0–44)
AST: 29 IU/L (ref 0–40)
BUN/Creatinine Ratio: 21 (ref 10–22)
BUN: 23 mg/dL (ref 8–27)
Bilirubin Total: 0.5 mg/dL (ref 0.0–1.2)
CALCIUM: 9 mg/dL (ref 8.6–10.2)
CO2: 25 mmol/L (ref 18–29)
Chloride: 102 mmol/L (ref 97–108)
Creatinine, Ser: 1.09 mg/dL (ref 0.76–1.27)
GFR calc Af Amer: 73 mL/min/{1.73_m2} (ref >59)
GFR calc non Af Amer: 63 mL/min/{1.73_m2} (ref >59)
GLUCOSE: 94 mg/dL (ref 65–99)
Globulin, Total: 2.3 g/dL (ref 1.5–4.5)
Potassium: 4.5 mmol/L (ref 3.5–5.2)
Sodium: 142 mmol/L (ref 134–144)
Total Protein: 6.2 g/dL (ref 6.0–8.5)

## 2015-06-26 LAB — LIPID PANEL
CHOL/HDL RATIO: 2.5 ratio (ref 0.0–5.0)
Cholesterol, Total: 140 mg/dL (ref 100–199)
HDL: 56 mg/dL (ref >39)
LDL Calculated: 74 mg/dL (ref 0–99)
Triglycerides: 52 mg/dL (ref 0–149)
VLDL Cholesterol Cal: 10 mg/dL (ref 5–40)

## 2015-07-17 ENCOUNTER — Other Ambulatory Visit: Payer: Self-pay | Admitting: Internal Medicine

## 2015-08-11 ENCOUNTER — Other Ambulatory Visit: Payer: Self-pay | Admitting: Internal Medicine

## 2015-08-27 ENCOUNTER — Other Ambulatory Visit: Payer: Self-pay | Admitting: *Deleted

## 2015-08-27 MED ORDER — ACYCLOVIR 400 MG PO TABS: ORAL_TABLET | ORAL | Status: DC

## 2015-08-27 NOTE — Telephone Encounter (Signed)
[facility] 

## 2015-09-21 ENCOUNTER — Other Ambulatory Visit: Payer: Medicare Other

## 2015-09-21 DIAGNOSIS — Z0000 Encounter for general adult medical examination without abnormal findings: Secondary | ICD-10-CM

## 2015-09-21 DIAGNOSIS — E785 Hyperlipidemia, unspecified: Secondary | ICD-10-CM | POA: Diagnosis not present

## 2015-09-21 DIAGNOSIS — R7309 Other abnormal glucose: Secondary | ICD-10-CM | POA: Diagnosis not present

## 2015-09-22 LAB — COMPREHENSIVE METABOLIC PANEL
ALBUMIN: 3.8 g/dL (ref 3.5–4.7)
ALT: 28 IU/L (ref 0–44)
AST: 35 IU/L (ref 0–40)
Albumin/Globulin Ratio: 1.7 (ref 1.1–2.5)
Alkaline Phosphatase: 65 IU/L (ref 39–117)
BILIRUBIN TOTAL: 0.4 mg/dL (ref 0.0–1.2)
BUN / CREAT RATIO: 18 (ref 10–22)
BUN: 19 mg/dL (ref 8–27)
CHLORIDE: 105 mmol/L (ref 97–106)
CO2: 24 mmol/L (ref 18–29)
Calcium: 8.8 mg/dL (ref 8.6–10.2)
Creatinine, Ser: 1.03 mg/dL (ref 0.76–1.27)
GFR calc Af Amer: 78 mL/min/{1.73_m2} (ref >59)
GFR, EST NON AFRICAN AMERICAN: 67 mL/min/{1.73_m2} (ref >59)
GLOBULIN, TOTAL: 2.3 g/dL (ref 1.5–4.5)
Glucose: 97 mg/dL (ref 65–99)
Potassium: 4.6 mmol/L (ref 3.5–5.2)
SODIUM: 143 mmol/L (ref 136–144)
TOTAL PROTEIN: 6.1 g/dL (ref 6.0–8.5)

## 2015-09-22 LAB — LIPID PANEL
CHOL/HDL RATIO: 2.4 ratio (ref 0.0–5.0)
Cholesterol, Total: 127 mg/dL (ref 100–199)
HDL: 52 mg/dL (ref >39)
LDL Calculated: 66 mg/dL (ref 0–99)
Triglycerides: 44 mg/dL (ref 0–149)
VLDL Cholesterol Cal: 9 mg/dL (ref 5–40)

## 2015-09-23 ENCOUNTER — Ambulatory Visit (INDEPENDENT_AMBULATORY_CARE_PROVIDER_SITE_OTHER): Payer: Medicare Other | Admitting: Internal Medicine

## 2015-09-23 ENCOUNTER — Encounter: Payer: Self-pay | Admitting: Internal Medicine

## 2015-09-23 VITALS — BP 106/76 | HR 64 | Temp 97.4°F | Resp 20 | Ht 72.0 in | Wt 183.6 lb

## 2015-09-23 DIAGNOSIS — I714 Abdominal aortic aneurysm, without rupture, unspecified: Secondary | ICD-10-CM

## 2015-09-23 DIAGNOSIS — M353 Polymyalgia rheumatica: Secondary | ICD-10-CM

## 2015-09-23 DIAGNOSIS — Z23 Encounter for immunization: Secondary | ICD-10-CM | POA: Diagnosis not present

## 2015-09-23 DIAGNOSIS — M720 Palmar fascial fibromatosis [Dupuytren]: Secondary | ICD-10-CM | POA: Diagnosis not present

## 2015-09-23 DIAGNOSIS — E785 Hyperlipidemia, unspecified: Secondary | ICD-10-CM | POA: Diagnosis not present

## 2015-09-23 DIAGNOSIS — M24542 Contracture, left hand: Secondary | ICD-10-CM | POA: Diagnosis not present

## 2015-09-23 NOTE — Progress Notes (Signed)
Patient ID: [name], male   DOB: 11/23/1930, 81 y.o.   MRN: [id number]    HISTORY AND PHYSICAL  Location:    [place]   Place of Service:   OFFICE  Extended Emergency Contact Information Primary Emergency Contact: Grandberry,Susan Address: [address]          [place], [place] [address] [place] Phone: [phone] Mobile Phone: [phone] Relation: Spouse  Advanced Directive information Does patient have an advance directive?: Yes, Type of Advance Directive: Living will;Healthcare Power of Attorney, Does patient want to make changes to advanced directive?: No - Patient declined  Chief Complaint  Patient presents with  . Annual Exam    Annual Exam, refused EKG  . Medical Management of Chronic Issues    Hyperlipidemia  . Immunizations    Will take flu shot today    HPI:  Hyperlipidemia - introital Crestor  Polymyalgia rheumatica (HCC) - stable on low-dose prednisone  Abdominal aortic aneurysm (AAA) without rupture ([place]) - continues with checkups every year with vascular and vein surgery.  Dupuytren's contracture of left hand - has had surgery in the past. Continues with contracture of the left fourth finger  Contracture of joint of finger of left hand - unchanged Dupuytren's contracture    Past Medical History  Diagnosis Date  . AAA (abdominal aortic aneurysm) ([place])   . Hyperlipidemia   . Polymyalgia rheumatica ([place])   . Benign neoplasm of colon 04/14/10    3 small polyps on colonoscopy by Dr. Henrene Pastor  . Impotence of organic origin     Penile implant  . Elevated prostate specific antigen (PSA)   . Disturbances metabolism of methionine, homocystine, and cystathionine (HCC)     Elevated homocysteine  . Migraine with aura, without mention of intractable migraine without mention of status migrainosus   . Carpal tunnel syndrome   . Otosclerosis of both ears   . Hearing loss   . Peripheral vascular disease (HCC)     Bilateral femoral bruit  .  Internal hemorrhoids   . Allergic rhinitis   . Shoulder pain   . Degenerative disc disease   . Neck pain   . Rotator cuff syndrome of left shoulder   . Plantar fasciitis   . Scoliosis   . Abnormal EKG     Left atrial abnormality    Past Surgical History  Procedure Laterality Date  . Colonoscopy w/ polypectomy    . Stapedectomy Bilateral 1985, [place]  . Implant penile pump  2001  . Resection of appendix and tip of rectum  February 2005  . Actinic keratosis removal  06/21/2010    Left shoulder; Dr. Sarajane Jews  . Dupuytren contracture release Right 12/05/2013    Procedure: DUPUYTREN CONTRACTURE RELEASE RIGHT LONG, RING AND SMALL FINGERS;  Surgeon: Cammie Sickle., MD;  Location: [facility];  Service: Orthopedics;  Laterality: Right;    Patient Care Team: Estill Dooms, MD as PCP - General (Internal Medicine)  Social History   Social History  . Marital Status: Married    Spouse Name: Marvie Calender  . Number of Children: 2  . Years of Education: N/A   Occupational History  . Environmental health practitioner    Social History Main Topics  . Smoking status: Never Smoker   . Smokeless tobacco: Never Used  . Alcohol Use: No  . Drug Use: No  . Sexual Activity: Not on file   Other Topics Concern  .  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    reports that he has never smoked. He has never used smokeless tobacco. He reports that he does not drink alcohol or use illicit drugs.  Family History  Problem Relation Age of Onset  . Heart disease Mother   . Emphysema Mother    Family Status  Relation Status Death Age  . Father Deceased 27    Murdered in a robbery  . Mother Deceased 57    Heart disease. Emphysema.  . Brother Alive   . Son Alive   . Son Alive     Immunization History  Administered Date(s) Administered  . Influenza,inj,Quad PF,36+ Mos  08/20/2013, 08/26/2014  . Influenza-Unspecified 09/22/2006, 09/26/2007, 10/13/2012  . Pneumococcal Polysaccharide-23 07/21/1998  . Tdap 06/29/2010    Allergies  Allergen Reactions  . Amoxicillin   . Codeine   . Doxycycline     Medications: Patient's Medications  New Prescriptions   No medications on file  Previous Medications   ACYCLOVIR (ZOVIRAX) 400 MG TABLET    Take one tablet by mouth twice daily due to herpes infection   ASPIRIN 81 MG TABLET    Take 81 mg by mouth daily.   BUTALBITAL-ACETAMINOPHEN-CAFFEINE (FIORICET, ESGIC) 50-325-40 MG PER TABLET    TAKE 1 OR 2 TABLETS UPTO 4 TIMES DAILY   CRESTOR 5 MG TABLET    TAKE ONE TABLET EACH DAY TO REDUCE CHOLESTEROL   FISH OIL-OMEGA-3 FATTY ACIDS 1000 MG CAPSULE    Take 1 g by mouth daily.    L-METHYLFOLATE-B12-B6-B2 (METAFOLBIC) 04-28-49-5 MG TABS    TAKE ONE TABLET TWICE DAILY   MULTIPLE VITAMINS-MINERALS (MULTIVITAMIN PO)    Take by mouth.   PREDNISONE (DELTASONE) 5 MG TABLET    TAKE 1/2 TO ONE TABLET DAILY TO PREVENT MYALGIAS   VITAMIN D, CHOLECALCIFEROL, PO    Take by mouth.  Modified Medications   No medications on file  Discontinued Medications   No medications on file    Review of Systems  Constitutional: Negative for fever, activity change, appetite change, fatigue and unexpected weight change.  HENT: Positive for hearing loss. Negative for ear pain, rhinorrhea, sinus pressure and sore throat.   Eyes: Positive for visual disturbance (corrective lenses).  Respiratory: Negative.  Negative for cough and shortness of breath.   Cardiovascular: Negative for chest pain, palpitations and leg swelling.       Hx AAA  Gastrointestinal: Negative.  Negative for nausea.  Endocrine: Negative.   Genitourinary: Positive for frequency.       Penile implant  Musculoskeletal: Negative.        Aches in hands. Left Dupuytren's at [address] [facility]. Left 5th flexion contracture.Scars right hand from prior repair of Dupuytren's. Pain and swelling  right thumb IP joint.  Allergic/Immunologic: Negative.   Neurological: Negative.   Hematological: Negative.   Psychiatric/Behavioral: Negative.     Filed Vitals:   09/23/15 1339  BP: 106/76  Pulse: 64  Temp: 97.4 F (36.3 C)  TempSrc: Oral  Resp: 20  Height: 6' (1.829 m)  Weight: 183 lb 9.6 oz (83.28 kg)  SpO2: 98%   Body mass index is 24.9 kg/(m^2).  Physical Exam  Constitutional: He is oriented to person, place, and time. He appears well-developed and well-nourished. No distress.  HENT:  Head: Normocephalic.  Right Ear: External ear normal.  Left Ear: External ear normal.  Nose: Nose normal.  Mouth/Throat: Oropharynx is clear and moist.  Right ear normal. Left EAC has wax occlusion.  Eyes: Conjunctivae and EOM are normal. Pupils are equal, round, and reactive to light.  Contact lenses.  Neck: Neck supple. No JVD present. No tracheal deviation present. No thyromegaly present.  Cardiovascular: Normal rate, regular rhythm, normal heart sounds and intact distal pulses.  Exam reveals no gallop and no friction rub.   No murmur heard. 1/4 bilateral femoral bruit. Lungs/4 abdominal aortic bruit. Increase pulsation at the abdominal aorta.  Pulmonary/Chest: No respiratory distress. He has no wheezes. He has no rales.  Abdominal: He exhibits no distension and no mass. There is no tenderness.  Genitourinary: Rectum normal and prostate normal. Guaiac negative stool. No penile tenderness.  Penile implant  Musculoskeletal: Normal range of motion. He exhibits no edema or tenderness.  Scoliosis Left Dupuytren's at [address] [facility]. Left 5th flexion contracture.Scars right hand from prior repair of Dupuytren's. Pain and swelling right thumb IP joint.  Neurological: He is alert and oriented to person, place, and time. He displays normal reflexes. No cranial nerve deficit. Coordination normal.  Skin: No rash noted. He is not diaphoretic. No erythema. No pallor.  Psychiatric: He has a normal mood and  affect. His behavior is normal. Judgment and thought content normal.    Labs reviewed: Lab Summary Latest Ref Rng 09/21/2015 06/25/2015 08/22/2014  Hemoglobin 13.0-17.0 g/dL (None) (None) (None)  Hematocrit 39.0-52.0 % (None) (None) (None)  White count - (None) (None) (None)  Platelet count - (None) (None) (None)  Sodium 136 - 144 mmol/L 143 142 141  Potassium 3.5 - 5.2 mmol/L 4.6 4.5 4.3  Calcium 8.6 - 10.2 mg/dL 8.8 9.0 9.1  Phosphorus - (None) (None) (None)  Creatinine 0.76 - 1.27 mg/dL 1.03 1.09 1.22  AST 0 - 40 IU/L 35 29 29  Alk Phos 39 - 117 IU/L 65 69 62  Bilirubin 0.0 - 1.2 mg/dL 0.4 0.5 0.7  Glucose 65 - 99 mg/dL 97 94 93  Cholesterol - (None) (None) (None)  HDL cholesterol >39 mg/dL 52 56 62  Triglycerides 0 - 149 mg/dL 44 52 87  LDL Direct - (None) (None) (None)  LDL Calc 0 - 99 mg/dL 66 74 84  Total protein - (None) (None) (None)  Albumin 3.5 - 4.7 g/dL 3.8 3.9 4.1   Lab Results  Component Value Date   BUN 19 09/21/2015    Assessment/Plan  1. Hyperlipidemia - Lipid panel; Future  2. Polymyalgia rheumatica (HCC) - Comprehensive metabolic panel; Future  3. Abdominal aortic aneurysm (AAA) without rupture ([place]) Abdominal ultrasound should be repeated in February 2017  4. Dupuytren's contracture of left hand Unchanged  5. Contracture of joint of finger of left hand Unchanged

## 2015-09-23 NOTE — Progress Notes (Signed)
Failed clock test 

## 2015-10-27 ENCOUNTER — Other Ambulatory Visit: Payer: Self-pay | Admitting: *Deleted

## 2015-10-27 MED ORDER — BUTALBITAL-APAP-CAFFEINE 50-325-40 MG PO TABS: ORAL_TABLET | ORAL | Status: DC

## 2015-10-27 NOTE — Telephone Encounter (Signed)
[facility] 

## 2015-12-04 ENCOUNTER — Encounter: Payer: Self-pay | Admitting: Internal Medicine

## 2015-12-11 ENCOUNTER — Other Ambulatory Visit: Payer: Self-pay | Admitting: *Deleted

## 2015-12-11 MED ORDER — ROSUVASTATIN CALCIUM 5 MG PO TABS: ORAL_TABLET | ORAL | Status: DC

## 2015-12-11 NOTE — Telephone Encounter (Signed)
[facility] 

## 2015-12-14 DIAGNOSIS — L570 Actinic keratosis: Secondary | ICD-10-CM | POA: Diagnosis not present

## 2015-12-14 DIAGNOSIS — D692 Other nonthrombocytopenic purpura: Secondary | ICD-10-CM | POA: Diagnosis not present

## 2015-12-14 DIAGNOSIS — L812 Freckles: Secondary | ICD-10-CM | POA: Diagnosis not present

## 2015-12-14 DIAGNOSIS — L821 Other seborrheic keratosis: Secondary | ICD-10-CM | POA: Diagnosis not present

## 2015-12-14 DIAGNOSIS — Z85828 Personal history of other malignant neoplasm of skin: Secondary | ICD-10-CM | POA: Diagnosis not present

## 2015-12-14 DIAGNOSIS — D225 Melanocytic nevi of trunk: Secondary | ICD-10-CM | POA: Diagnosis not present

## 2015-12-16 DIAGNOSIS — H17821 Peripheral opacity of cornea, right eye: Secondary | ICD-10-CM | POA: Diagnosis not present

## 2015-12-21 DIAGNOSIS — H17821 Peripheral opacity of cornea, right eye: Secondary | ICD-10-CM | POA: Diagnosis not present

## 2015-12-21 DIAGNOSIS — H2513 Age-related nuclear cataract, bilateral: Secondary | ICD-10-CM | POA: Diagnosis not present

## 2016-01-04 ENCOUNTER — Other Ambulatory Visit: Payer: Self-pay | Admitting: Nurse Practitioner

## 2016-01-15 ENCOUNTER — Encounter: Payer: Self-pay | Admitting: Vascular Surgery

## 2016-01-18 ENCOUNTER — Other Ambulatory Visit: Payer: Self-pay | Admitting: *Deleted

## 2016-01-18 MED ORDER — METAFOLBIC 6-1-50-5 MG PO TABS: 1.0000 | ORAL_TABLET | Freq: Two times a day (BID) | ORAL | Status: DC

## 2016-01-18 NOTE — Telephone Encounter (Signed)
[facility] 

## 2016-01-26 ENCOUNTER — Ambulatory Visit (INDEPENDENT_AMBULATORY_CARE_PROVIDER_SITE_OTHER): Payer: Medicare Other | Admitting: Vascular Surgery

## 2016-01-26 ENCOUNTER — Ambulatory Visit (HOSPITAL_COMMUNITY)
Admission: RE | Admit: 2016-01-26 | Discharge: 2016-01-26 | Disposition: A | Discharge status: Home / Self Care | Payer: Medicare Other | Source: Ambulatory Visit | Attending: Vascular Surgery | Admitting: Vascular Surgery

## 2016-01-26 ENCOUNTER — Encounter: Payer: Self-pay | Admitting: Vascular Surgery

## 2016-01-26 VITALS — BP 121/70 | HR 61 | Ht 72.0 in | Wt 181.3 lb

## 2016-01-26 DIAGNOSIS — E785 Hyperlipidemia, unspecified: Secondary | ICD-10-CM | POA: Insufficient documentation

## 2016-01-26 DIAGNOSIS — I714 Abdominal aortic aneurysm, without rupture, unspecified: Secondary | ICD-10-CM

## 2016-01-26 NOTE — Progress Notes (Signed)
Subjective:     Patient ID: [name], male   DOB: 01-27-1930, 82 y.o.   MRN: [id number]  HPI this 82 year old male returns for continued follow-up regarding his abdominal aortic aneurysm which we have been following for several years. Last year the dimension was 4.2 cm in the infra renal aorta. He has had no abdominal or back pain. He has had no significant medical issues in the past 12 months.  Past Medical History  Diagnosis Date  . AAA (abdominal aortic aneurysm) ([place])   . Hyperlipidemia   . Polymyalgia rheumatica ([place])   . Benign neoplasm of colon 04/14/10    3 small polyps on colonoscopy by Dr. Henrene Pastor  . Impotence of organic origin     Penile implant  . Elevated prostate specific antigen (PSA)   . Disturbances metabolism of methionine, homocystine, and cystathionine (HCC)     Elevated homocysteine  . Migraine with aura, without mention of intractable migraine without mention of status migrainosus   . Carpal tunnel syndrome   . Otosclerosis of both ears   . Hearing loss   . Peripheral vascular disease (HCC)     Bilateral femoral bruit  . Internal hemorrhoids   . Allergic rhinitis   . Shoulder pain   . Degenerative disc disease   . Neck pain   . Rotator cuff syndrome of left shoulder   . Plantar fasciitis   . Scoliosis   . Abnormal EKG     Left atrial abnormality    Social History  Substance Use Topics  . Smoking status: Never Smoker   . Smokeless tobacco: Never Used  . Alcohol Use: No    Family History  Problem Relation Age of Onset  . Heart disease Mother   . Emphysema Mother     Allergies  Allergen Reactions  . Amoxicillin   . Codeine   . Doxycycline      Current outpatient prescriptions:  .  acyclovir (ZOVIRAX) 400 MG tablet, Take one tablet by mouth twice daily due to herpes infection, Disp: 60 tablet, Rfl: 5 .  aspirin 81 MG tablet, Take 81 mg by mouth daily., Disp: , Rfl:  .  butalbital-acetaminophen-caffeine (FIORICET, ESGIC) 50-325-40 MG  tablet, TAKE 1 OR 2 TABLETS UPTO 4 TIMES DAILY, Disp: 30 tablet, Rfl: 0 .  fish oil-omega-3 fatty acids 1000 MG capsule, Take 1 g by mouth daily. , Disp: , Rfl:  .  L-Methylfolate-B12-B6-B2 (METAFOLBIC) 04-28-49-5 MG TABS, Take 1 tablet by mouth 2 (two) times daily., Disp: 60 tablet, Rfl: 5 .  Multiple Vitamins-Minerals (MULTIVITAMIN PO), Take by mouth., Disp: , Rfl:  .  predniSONE (DELTASONE) 5 MG tablet, TAKE 1/2 TO ONE TABLET DAILY TO PREVENT MYALGIAS, Disp: 90 tablet, Rfl: 3 .  rosuvastatin (CRESTOR) 5 MG tablet, Take one tablet by mouth once daily to reduce cholesterol, Disp: 90 tablet, Rfl: 1 .  VITAMIN D, CHOLECALCIFEROL, PO, Take by mouth., Disp: , Rfl:   Filed Vitals:   01/26/16 0853  BP: 121/70  Pulse: 61  Height: 6' (1.829 m)  Weight: 181 lb 4.8 oz (82.237 kg)  SpO2: 100%    Body mass index is 24.58 kg/(m^2).           Review of Systems denies chest pain, dyspnea on exertion, PND, orthopnea, hemoptysis, claudication, lateralizing weakness, aphasia, amaurosis fugax, diplopia, blurred vision, or syncope.-All systems negative and a complete review of systems in this healthy gentleman     Objective:   Physical Exam BP 121/70 mmHg  Pulse 61  Ht 6' (1.829 m)  Wt 181 lb 4.8 oz (82.237 kg)  BMI 24.58 kg/m2  SpO2 100%  Gen.-alert and oriented x3 in no apparent distress HEENT normal for age Lungs no rhonchi or wheezing Cardiovascular regular rhythm no murmurs carotid pulses 3+ palpable no bruits audible Abdomen soft nontender-small pulsatile mass approximating 4-4.5 cm Musculoskeletal free of  major deformities Skin clear -no rashes Neurologic normal Lower extremities 3+ femoral and dorsalis pedis pulses palpable bilaterally with no edema  Today I ordered a duplex scan of his abdominal aorta which I reviewed and interpreted. The maximum diameter of his aortic aneurysm is 4.44 cm compared to 4.2 cm 1 year ago.       Assessment:     Abdominal aortic aneurysm-4.44  cm-has enlarged by 0.24 cm in past 12 months History of carpal tunnel Polymyalgia rheumatica Hyperlipidemia    Plan:     Return in 9 months with duplex scan of abdominal aortic aneurysm to see Dr. Annamarie Major Informed patient that I will be retiring July 1 of 2017 and he will continue to be followed in our office by Dr. [name] for this aortic aneurysm

## 2016-01-26 NOTE — Addendum Note (Signed)
Addended by: Dorthula Rue L on: 01/26/2016 09:33 AM   Modules accepted: Orders

## 2016-02-26 ENCOUNTER — Other Ambulatory Visit: Payer: Self-pay | Admitting: *Deleted

## 2016-02-26 MED ORDER — ACYCLOVIR 400 MG PO TABS: ORAL_TABLET | ORAL | Status: DC

## 2016-02-26 NOTE — Telephone Encounter (Signed)
[facility] 

## 2016-03-21 ENCOUNTER — Other Ambulatory Visit: Payer: Medicare Other

## 2016-03-21 DIAGNOSIS — M353 Polymyalgia rheumatica: Secondary | ICD-10-CM | POA: Diagnosis not present

## 2016-03-21 DIAGNOSIS — E785 Hyperlipidemia, unspecified: Secondary | ICD-10-CM | POA: Diagnosis not present

## 2016-03-22 LAB — COMPREHENSIVE METABOLIC PANEL
ALT: 25 IU/L (ref 0–44)
AST: 30 IU/L (ref 0–40)
Albumin/Globulin Ratio: 1.6 (ref 1.2–2.2)
Albumin: 3.9 g/dL (ref 3.5–4.7)
Alkaline Phosphatase: 58 IU/L (ref 39–117)
BUN / CREAT RATIO: 19 (ref 10–24)
BUN: 21 mg/dL (ref 8–27)
Bilirubin Total: 0.4 mg/dL (ref 0.0–1.2)
CO2: 26 mmol/L (ref 18–29)
Calcium: 8.8 mg/dL (ref 8.6–10.2)
Chloride: 102 mmol/L (ref 96–106)
Creatinine, Ser: 1.12 mg/dL (ref 0.76–1.27)
GFR calc Af Amer: 70 mL/min/{1.73_m2} (ref >59)
GFR calc non Af Amer: 60 mL/min/{1.73_m2} (ref >59)
GLUCOSE: 93 mg/dL (ref 65–99)
Globulin, Total: 2.5 g/dL (ref 1.5–4.5)
POTASSIUM: 4.4 mmol/L (ref 3.5–5.2)
Sodium: 142 mmol/L (ref 134–144)
Total Protein: 6.4 g/dL (ref 6.0–8.5)

## 2016-03-22 LAB — LIPID PANEL
CHOL/HDL RATIO: 2.5 ratio (ref 0.0–5.0)
Cholesterol, Total: 138 mg/dL (ref 100–199)
HDL: 56 mg/dL (ref >39)
LDL Calculated: 68 mg/dL (ref 0–99)
Triglycerides: 68 mg/dL (ref 0–149)
VLDL CHOLESTEROL CAL: 14 mg/dL (ref 5–40)

## 2016-03-23 ENCOUNTER — Ambulatory Visit (INDEPENDENT_AMBULATORY_CARE_PROVIDER_SITE_OTHER): Payer: Medicare Other | Admitting: Internal Medicine

## 2016-03-23 ENCOUNTER — Encounter: Payer: Self-pay | Admitting: Internal Medicine

## 2016-03-23 VITALS — BP 120/74 | HR 53 | Temp 97.6°F | Ht 72.0 in | Wt 182.0 lb

## 2016-03-23 DIAGNOSIS — I714 Abdominal aortic aneurysm, without rupture, unspecified: Secondary | ICD-10-CM

## 2016-03-23 DIAGNOSIS — E785 Hyperlipidemia, unspecified: Secondary | ICD-10-CM

## 2016-03-23 DIAGNOSIS — M353 Polymyalgia rheumatica: Secondary | ICD-10-CM | POA: Diagnosis not present

## 2016-03-23 NOTE — Progress Notes (Signed)
Patient ID: [name], male   DOB: 07-14-1930, 82 y.o.   MRN: [id number]    Facility  [facility]    Place of Service:   OFFICE    Allergies  Allergen Reactions  . Amoxicillin   . Codeine   . Doxycycline     Chief Complaint  Patient presents with  . Medical Management of Chronic Issues    6 month visit  cholesterol, AAA, polymyalgia rheumatica    HPI:   Abdominal aortic aneurysm (AAA) without rupture ([place]) - asymptomatic. Sees vascular surgeons regularly  Hyperlipidemia - excellent control on rosuvustatin  Polymyalgia rheumatica ([place]) -remains on 5 mg prednisone    Medications: Patient's Medications  New Prescriptions   No medications on file  Previous Medications   ACYCLOVIR (ZOVIRAX) 400 MG TABLET    Take one tablet by mouth twice daily due to herpes infection   ASPIRIN 81 MG TABLET    Take 81 mg by mouth daily.   BUTALBITAL-ACETAMINOPHEN-CAFFEINE (FIORICET, ESGIC) 50-325-40 MG TABLET    TAKE 1 OR 2 TABLETS UPTO 4 TIMES DAILY   FISH OIL-OMEGA-3 FATTY ACIDS 1000 MG CAPSULE    Take 1 g by mouth daily.    L-METHYLFOLATE-B12-B6-B2 (METAFOLBIC) 04-28-49-5 MG TABS    Take 1 tablet by mouth 2 (two) times daily.   MULTIPLE VITAMINS-MINERALS (MULTIVITAMIN PO)    Take by mouth.   PREDNISONE (DELTASONE) 5 MG TABLET    TAKE 1/2 TO ONE TABLET DAILY TO PREVENT MYALGIAS   ROSUVASTATIN (CRESTOR) 5 MG TABLET    Take one tablet by mouth once daily to reduce cholesterol   VITAMIN D, CHOLECALCIFEROL, PO    Take by mouth.  Modified Medications   No medications on file  Discontinued Medications   No medications on file    Review of Systems  Constitutional: Negative for fever, activity change, appetite change, fatigue and unexpected weight change.  HENT: Positive for hearing loss. Negative for ear pain, rhinorrhea, sinus pressure and sore throat.   Eyes: Positive for visual disturbance (corrective lenses).  Respiratory: Negative.  Negative for cough and shortness of breath.     Cardiovascular: Negative for chest pain, palpitations and leg swelling.       Hx AAA  Gastrointestinal: Negative.  Negative for nausea.  Endocrine: Negative.   Genitourinary: Positive for frequency.       Penile implant  Musculoskeletal: Negative.        Aches in hands. Left Dupuytren's at [address] [facility]. Left 5th flexion contracture.Scars right hand from prior repair of Dupuytren's. Pain and swelling right thumb IP joint.  Allergic/Immunologic: Negative.   Neurological: Negative.   Hematological: Negative.   Psychiatric/Behavioral: Negative.     Filed Vitals:   03/23/16 1158  BP: 120/74  Pulse: 53  Temp: 97.6 F (36.4 C)  TempSrc: Oral  Height: 6' (1.829 m)  Weight: 182 lb (82.555 kg)  SpO2: 96%   Body mass index is 24.68 kg/(m^2). Filed Weights   03/23/16 1158  Weight: 182 lb (82.555 kg)     Physical Exam  Constitutional: He is oriented to person, place, and time. He appears well-developed and well-nourished. No distress.  HENT:  Head: Normocephalic.  Right Ear: External ear normal.  Left Ear: External ear normal.  Nose: Nose normal.  Mouth/Throat: Oropharynx is clear and moist.  Right ear normal. Left EAC has wax occlusion.  Eyes: Conjunctivae and EOM are normal. Pupils are equal, round, and reactive to light.  Contact lenses.  Neck: Neck supple. No JVD  present. No tracheal deviation present. No thyromegaly present.  Cardiovascular: Normal rate, regular rhythm, normal heart sounds and intact distal pulses.  Exam reveals no gallop and no friction rub.   No murmur heard. 1/4 bilateral femoral bruit. Lungs/4 abdominal aortic bruit. Increase pulsation at the abdominal aorta.  Pulmonary/Chest: No respiratory distress. He has no wheezes. He has no rales.  Abdominal: He exhibits no distension and no mass. There is no tenderness.  Genitourinary: Rectum normal and prostate normal. Guaiac negative stool. No penile tenderness.  Penile implant  Musculoskeletal: Normal range of  motion. He exhibits no edema or tenderness.  Scoliosis Left Dupuytren's at [address] [facility]. Left 5th flexion contracture.Scars right hand from prior repair of Dupuytren's. Pain and swelling right thumb IP joint.  Neurological: He is alert and oriented to person, place, and time. He displays normal reflexes. No cranial nerve deficit. Coordination normal.  Skin: No rash noted. He is not diaphoretic. No erythema. No pallor.  Psychiatric: He has a normal mood and affect. His behavior is normal. Judgment and thought content normal.    Labs reviewed: Lab Summary Latest Ref Rng 03/21/2016 09/21/2015 06/25/2015  Hemoglobin 13.0-17.0 g/dL (None) (None) (None)  Hematocrit 39.0-52.0 % (None) (None) (None)  White count - (None) (None) (None)  Platelet count - (None) (None) (None)  Sodium 134 - 144 mmol/L 142 143 142  Potassium 3.5 - 5.2 mmol/L 4.4 4.6 4.5  Calcium 8.6 - 10.2 mg/dL 8.8 8.8 9.0  Phosphorus - (None) (None) (None)  Creatinine 0.76 - 1.27 mg/dL 1.12 1.03 1.09  AST 0 - 40 IU/L 30 35 29  Alk Phos 39 - 117 IU/L 58 65 69  Bilirubin 0.0 - 1.2 mg/dL 0.4 0.4 0.5  Glucose 65 - 99 mg/dL 93 97 94  Cholesterol - (None) (None) (None)  HDL cholesterol >39 mg/dL 56 52 56  Triglycerides 0 - 149 mg/dL 68 44 52  LDL Direct - (None) (None) (None)  LDL Calc 0 - 99 mg/dL 68 66 74  Total protein - (None) (None) (None)  Albumin 3.5 - 4.7 g/dL 3.9 3.8 3.9   No results found for: TSH, T3TOTAL, T4TOTAL, THYROIDAB Lab Results  Component Value Date   BUN 21 03/21/2016   BUN 19 09/21/2015   BUN 23 06/25/2015   No results found for: HGBA1C  Assessment/Plan   1. Abdominal aortic aneurysm (AAA) without rupture ([place]) - EKG 12-Lead; Future  2. Hyperlipidemia - Lipid panel; Future  3. Polymyalgia rheumatica (HCC) - Comprehensive metabolic panel; Future

## 2016-04-08 DIAGNOSIS — H17821 Peripheral opacity of cornea, right eye: Secondary | ICD-10-CM | POA: Diagnosis not present

## 2016-04-08 DIAGNOSIS — H2513 Age-related nuclear cataract, bilateral: Secondary | ICD-10-CM | POA: Diagnosis not present

## 2016-04-19 DIAGNOSIS — L820 Inflamed seborrheic keratosis: Secondary | ICD-10-CM | POA: Diagnosis not present

## 2016-04-19 DIAGNOSIS — L821 Other seborrheic keratosis: Secondary | ICD-10-CM | POA: Diagnosis not present

## 2016-04-19 DIAGNOSIS — Z85828 Personal history of other malignant neoplasm of skin: Secondary | ICD-10-CM | POA: Diagnosis not present

## 2016-04-19 DIAGNOSIS — D485 Neoplasm of uncertain behavior of skin: Secondary | ICD-10-CM | POA: Diagnosis not present

## 2016-04-20 ENCOUNTER — Other Ambulatory Visit: Payer: Self-pay | Admitting: Internal Medicine

## 2016-04-22 ENCOUNTER — Other Ambulatory Visit: Payer: Self-pay | Admitting: *Deleted

## 2016-04-22 MED ORDER — ACYCLOVIR 400 MG PO TABS: ORAL_TABLET | ORAL | Status: DC

## 2016-04-22 NOTE — Telephone Encounter (Signed)
[facility] 

## 2016-06-04 ENCOUNTER — Other Ambulatory Visit: Payer: Self-pay | Admitting: Internal Medicine

## 2016-06-16 ENCOUNTER — Encounter: Payer: Self-pay | Admitting: Internal Medicine

## 2016-06-27 ENCOUNTER — Other Ambulatory Visit: Payer: Self-pay | Admitting: Internal Medicine

## 2016-07-15 ENCOUNTER — Other Ambulatory Visit: Payer: Self-pay | Admitting: Internal Medicine

## 2016-07-20 NOTE — Addendum Note (Signed)
Addended by: Rafael Bihari A on: 07/20/2016 04:42 PM   Modules accepted: Orders

## 2016-08-06 ENCOUNTER — Other Ambulatory Visit: Payer: Self-pay | Admitting: Internal Medicine

## 2016-09-10 ENCOUNTER — Other Ambulatory Visit: Payer: Self-pay | Admitting: Internal Medicine

## 2016-09-12 ENCOUNTER — Other Ambulatory Visit: Payer: Self-pay | Admitting: *Deleted

## 2016-09-12 MED ORDER — BUTALBITAL-APAP-CAFFEINE 50-325-40 MG PO TABS: ORAL_TABLET | ORAL | 0 refills | Status: DC

## 2016-09-14 ENCOUNTER — Telehealth: Payer: Self-pay | Admitting: Internal Medicine

## 2016-09-14 NOTE — Telephone Encounter (Signed)
left msg asking if pt can come at 10:00 on 11/1 to meet w/ nurse for AWV then see doc. VDM (dee-dee)

## 2016-09-26 ENCOUNTER — Other Ambulatory Visit: Payer: Medicare Other

## 2016-09-26 DIAGNOSIS — E785 Hyperlipidemia, unspecified: Secondary | ICD-10-CM

## 2016-09-26 DIAGNOSIS — M353 Polymyalgia rheumatica: Secondary | ICD-10-CM

## 2016-09-26 LAB — COMPREHENSIVE METABOLIC PANEL
ALK PHOS: 52 U/L (ref 40–115)
ALT: 20 U/L (ref 9–46)
AST: 25 U/L (ref 10–35)
Albumin: 3.9 g/dL (ref 3.6–5.1)
BUN: 27 mg/dL — AB (ref 7–25)
CO2: 28 mmol/L (ref 20–31)
Calcium: 9.2 mg/dL (ref 8.6–10.3)
Chloride: 106 mmol/L (ref 98–110)
Creat: 1.17 mg/dL — ABNORMAL HIGH (ref 0.70–1.11)
GLUCOSE: 97 mg/dL (ref 65–99)
Potassium: 4.3 mmol/L (ref 3.5–5.3)
SODIUM: 141 mmol/L (ref 135–146)
TOTAL PROTEIN: 6.4 g/dL (ref 6.1–8.1)
Total Bilirubin: 0.7 mg/dL (ref 0.2–1.2)

## 2016-09-26 LAB — LIPID PANEL
CHOLESTEROL: 148 mg/dL (ref 125–200)
HDL: 52 mg/dL (ref >=40)
LDL Cholesterol: 82 mg/dL (ref <130)
Total CHOL/HDL Ratio: 2.8 Ratio (ref <=5.0)
Triglycerides: 71 mg/dL (ref <150)
VLDL: 14 mg/dL (ref <30)

## 2016-09-27 ENCOUNTER — Other Ambulatory Visit: Payer: Self-pay | Admitting: Internal Medicine

## 2016-09-28 ENCOUNTER — Encounter: Payer: Medicare Other | Admitting: Internal Medicine

## 2016-09-28 ENCOUNTER — Encounter: Payer: Self-pay | Admitting: Internal Medicine

## 2016-09-28 ENCOUNTER — Ambulatory Visit (INDEPENDENT_AMBULATORY_CARE_PROVIDER_SITE_OTHER): Payer: Medicare Other | Admitting: Internal Medicine

## 2016-09-28 VITALS — BP 128/86 | HR 51 | Temp 97.2°F | Ht 72.0 in | Wt 182.0 lb

## 2016-09-28 DIAGNOSIS — I714 Abdominal aortic aneurysm, without rupture, unspecified: Secondary | ICD-10-CM

## 2016-09-28 DIAGNOSIS — M353 Polymyalgia rheumatica: Secondary | ICD-10-CM

## 2016-09-28 DIAGNOSIS — I679 Cerebrovascular disease, unspecified: Secondary | ICD-10-CM

## 2016-09-28 DIAGNOSIS — E785 Hyperlipidemia, unspecified: Secondary | ICD-10-CM | POA: Diagnosis not present

## 2016-09-28 DIAGNOSIS — Z23 Encounter for immunization: Secondary | ICD-10-CM

## 2016-09-28 NOTE — Progress Notes (Signed)
HISTORY AND PHYSICAL  Location:       Place of Service:     Extended Emergency Contact Information Primary Emergency Contact: Isley,Susan Address: [address]          [place], [place] [address] [place] Phone: [phone] Mobile Phone: [phone] Relation: Spouse  Advanced Directive information Does patient have an advance directive?: Yes, Type of Advance Directive: Healthcare Power of [place];Living will  Chief Complaint  Patient presents with  . Annual Exam    Wellness exam  . Medical Management of Chronic Issues    AAA, cholesterol, polymyalgia rheumatica, review labs    HPI:  Abdominal aortic aneurysm (AAA) without rupture (HCC)  Hyperlipidemia, unspecified hyperlipidemia type  Polymyalgia rheumatica (HCC)  Cerebrovascular disease  Special screening for malignant neoplasm of prostate    Past Medical History:  Diagnosis Date  . AAA (abdominal aortic aneurysm) ([place])   . Abnormal EKG    Left atrial abnormality  . Allergic rhinitis   . Benign neoplasm of colon 04/14/10   3 small polyps on colonoscopy by Dr. Henrene Pastor  . Carpal tunnel syndrome   . Degenerative disc disease   . Disturbances metabolism of methionine, homocystine, and cystathionine (HCC)    Elevated homocysteine  . Elevated prostate specific antigen (PSA)   . Hearing loss   . Hyperlipidemia   . Impotence of organic origin    Penile implant  . Internal hemorrhoids   . Migraine with aura, without mention of intractable migraine without mention of status migrainosus   . Neck pain   . Otosclerosis of both ears   . Peripheral vascular disease (HCC)    Bilateral femoral bruit  . Plantar fasciitis   . Polymyalgia rheumatica ([place])   . Rotator cuff syndrome of left shoulder   . Scoliosis   . Shoulder pain     Past Surgical History:  Procedure Laterality Date  . Actinic keratosis removal  06/21/2010   Left shoulder; Dr. Sarajane Jews  . COLONOSCOPY W/ POLYPECTOMY      . DUPUYTREN CONTRACTURE RELEASE Right 12/05/2013   Procedure: DUPUYTREN CONTRACTURE RELEASE RIGHT LONG, RING AND SMALL FINGERS;  Surgeon: Cammie Sickle., MD;  Location: [facility];  Service: Orthopedics;  Laterality: Right;  . Implant penile pump  2001  . Resection of appendix and tip of rectum  February 2005  . STAPEDECTOMY Bilateral 1985, [place]    Patient Care Team: Estill Dooms, MD as PCP - General (Internal Medicine) Marilynne Halsted, MD as Referring Physician (Ophthalmology) Mal Misty, MD as Consulting Physician (Vascular Surgery)  Social History   Social History  . Marital status: Married    Spouse name: Trevonne Nyland  . Number of children: 2  . Years of education: N/A   Occupational History  . [employer]   Social History Main Topics  . Smoking status: Never Smoker  . Smokeless tobacco: Never Used  . Alcohol use No  . Drug use: No  . Sexual activity: Not on file   Other Topics Concern  . 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    reports that he has never smoked. He has never used smokeless tobacco. He reports that he does not drink alcohol or use drugs.  Family History  Problem Relation Age of Onset  . Heart disease Mother   .  Emphysema Mother    Family Status  Relation Status  . Father Deceased at age 20   Murdered in a robbery  . Mother Deceased at age 65   Heart disease. Emphysema.  . Brother Alive  . Son Alive  . Son Alive    Immunization History  Administered Date(s) Administered  . Influenza,inj,Quad PF,36+ Mos 08/20/2013, 08/26/2014, 09/23/2015  . Influenza-Unspecified 09/22/2006, 09/26/2007, 10/13/2012  . Pneumococcal Polysaccharide-23 07/21/1998  . Tdap 06/29/2010    Allergies  Allergen Reactions  . Amoxicillin   . Codeine   . Doxycycline     Medications: Patient's  Medications  New Prescriptions   No medications on file  Previous Medications   ACYCLOVIR (ZOVIRAX) 400 MG TABLET    TAKE ONE TABLET TWICE DAILY DUE TO HERPES INFECTION   ASPIRIN 81 MG TABLET    Take 81 mg by mouth daily.   BUTALBITAL-ACETAMINOPHEN-CAFFEINE (FIORICET, ESGIC) 50-325-40 MG TABLET    TAKE 1 OR 2 TABLETS UPTO 4 TIMES DAILY   FISH OIL-OMEGA-3 FATTY ACIDS 1000 MG CAPSULE    Take 1 g by mouth daily.    L-METHYLFOLATE-B12-B6-B2 (METAFOLBIC) 04-28-49-5 MG TABS    TAKE ONE TABLET TWICE DAILY   MULTIPLE VITAMINS-MINERALS (MULTIVITAMIN PO)    Take by mouth.   PREDNISONE (DELTASONE) 5 MG TABLET    TAKE 1/2 TO ONE TABLET DAILY TO PREVENT MYALGIAS   ROSUVASTATIN (CRESTOR) 5 MG TABLET    TAKE ONE TABLET BY MOUTH ONCE DAILY TO REDUCE CHOLESTEROL   VITAMIN D, CHOLECALCIFEROL, PO    Take by mouth.  Modified Medications   No medications on file  Discontinued Medications   No medications on file    Review of Systems  Constitutional: Negative for activity change, appetite change, fatigue, fever and unexpected weight change.  HENT: Positive for hearing loss. Negative for ear pain, rhinorrhea, sinus pressure and sore throat.   Eyes: Positive for visual disturbance (corrective lenses).  Respiratory: Negative.  Negative for cough and shortness of breath.   Cardiovascular: Negative for chest pain, palpitations and leg swelling.       Hx AAA  Gastrointestinal: Negative.  Negative for nausea.  Endocrine: Negative.   Genitourinary: Positive for frequency.       Penile implant  Musculoskeletal: Negative.        Aches in hands. Left Dupuytren's at [address] [facility]. Left 5th flexion contracture.Scars right hand from prior repair of Dupuytren's. Pain and swelling right thumb IP joint.  Allergic/Immunologic: Negative.   Neurological: Negative.   Hematological: Negative.   Psychiatric/Behavioral: Negative.     Vitals:   09/28/16 1134  BP: 128/86  Pulse: (!) 51  Temp: 97.2 F (36.2 C)  TempSrc: Oral    SpO2: 96%  Weight: 182 lb (82.6 kg)  Height: 6' (1.829 m)   Body mass index is 24.68 kg/m. Filed Weights   09/28/16 1134  Weight: 182 lb (82.6 kg)     Physical Exam  Constitutional: He is oriented to person, place, and time. He appears well-developed and well-nourished. No distress.  HENT:  Head: Normocephalic.  Right Ear: External ear normal.  Left Ear: External ear normal.  Nose: Nose normal.  Mouth/Throat: Oropharynx is clear and moist.  Right ear normal. Left EAC has wax occlusion.  Eyes: Conjunctivae and EOM are normal. Pupils are equal, round, and reactive to light.  Contact lenses.  Neck: Neck supple. No JVD present. No tracheal deviation present. No thyromegaly present.  Cardiovascular: Normal rate, regular rhythm, normal heart sounds and intact  distal pulses.  Exam reveals no gallop and no friction rub.   No murmur heard. 1/4 bilateral femoral bruit. Lungs/4 abdominal aortic bruit. Increase pulsation at the abdominal aorta.  Pulmonary/Chest: No respiratory distress. He has no wheezes. He has no rales.  Abdominal: He exhibits no distension and no mass. There is no tenderness.  Genitourinary: Rectum normal and prostate normal. Rectal exam shows guaiac negative stool. No penile tenderness.  Genitourinary Comments: Penile implant  Musculoskeletal: Normal range of motion. He exhibits no edema or tenderness.  Scoliosis Left Dupuytren's at [address] [facility]. Left 5th flexion contracture.Scars right hand from prior repair of Dupuytren's. Pain and swelling right thumb IP joint.  Neurological: He is alert and oriented to person, place, and time. He displays normal reflexes. No cranial nerve deficit. Coordination normal.  09/28/16 MMSE 29/30. Failed clock drawing.  Skin: No rash noted. He is not diaphoretic. No erythema. No pallor.  Psychiatric: He has a normal mood and affect. His behavior is normal. Judgment and thought content normal.    Labs reviewed: Lab Summary Latest Ref Rng &  Units 09/26/2016 03/21/2016 09/21/2015  Hemoglobin 13.0-17.0 g/dL (None) (None) (None)  Hematocrit 39.0-52.0 % (None) (None) (None)  White count - (None) (None) (None)  Platelet count - (None) (None) (None)  Sodium 135 - 146 mmol/L 141 142 143  Potassium 3.5 - 5.3 mmol/L 4.3 4.4 4.6  Calcium 8.6 - 10.3 mg/dL 9.2 8.8 8.8  Phosphorus - (None) (None) (None)  Creatinine 0.70 - 1.11 mg/dL 1.17(H) 1.12 1.03  AST 10 - 35 U/L 25 30 35  Alk Phos 40 - 115 U/L 52 58 65  Bilirubin 0.2 - 1.2 mg/dL 0.7 0.4 0.4  Glucose 65 - 99 mg/dL 97 93 97  Cholesterol 125 - 200 mg/dL 148 (None) (None)  HDL cholesterol >=40 mg/dL 52 56 52  Triglycerides <150 mg/dL 71 68 44  LDL Direct - (None) (None) (None)  LDL Calc <130 mg/dL 82 68 66  Total protein 6.1 - 8.1 g/dL 6.4 (None) (None)  Albumin 3.6 - 5.1 g/dL 3.9 3.9 3.8  Some recent data might be hidden   Lab Results  Component Value Date   BUN 27 (H) 09/26/2016     Assessment/Plan  1. Abdominal aortic aneurysm (AAA) without rupture ([place]) -[place] 10/31/16  2. Hyperlipidemia, unspecified hyperlipidemia type - Lipid panel; Future  3. Polymyalgia rheumatica (HCC) - Comprehensive metabolic panel; Future  4. Cerebrovascular disease Noted on CT brain 2013

## 2016-10-04 DIAGNOSIS — Z23 Encounter for immunization: Secondary | ICD-10-CM | POA: Diagnosis not present

## 2016-10-19 DIAGNOSIS — M79642 Pain in left hand: Secondary | ICD-10-CM | POA: Diagnosis not present

## 2016-10-19 DIAGNOSIS — R52 Pain, unspecified: Secondary | ICD-10-CM | POA: Diagnosis not present

## 2016-10-19 DIAGNOSIS — R2232 Localized swelling, mass and lump, left upper limb: Secondary | ICD-10-CM | POA: Diagnosis not present

## 2016-10-19 DIAGNOSIS — M19042 Primary osteoarthritis, left hand: Secondary | ICD-10-CM | POA: Diagnosis not present

## 2016-10-19 DIAGNOSIS — M19049 Primary osteoarthritis, unspecified hand: Secondary | ICD-10-CM | POA: Insufficient documentation

## 2016-10-26 ENCOUNTER — Encounter: Payer: Self-pay | Admitting: Surgery

## 2016-10-26 DIAGNOSIS — H17821 Peripheral opacity of cornea, right eye: Secondary | ICD-10-CM | POA: Diagnosis not present

## 2016-10-31 ENCOUNTER — Ambulatory Visit (HOSPITAL_COMMUNITY)
Admission: RE | Admit: 2016-10-31 | Discharge: 2016-10-31 | Disposition: A | Discharge status: Home / Self Care | Payer: Medicare Other | Source: Ambulatory Visit | Attending: Family Medicine | Admitting: Family Medicine

## 2016-10-31 ENCOUNTER — Ambulatory Visit (INDEPENDENT_AMBULATORY_CARE_PROVIDER_SITE_OTHER): Payer: Medicare Other | Admitting: Surgery

## 2016-10-31 ENCOUNTER — Encounter: Payer: Self-pay | Admitting: Surgery

## 2016-10-31 VITALS — BP 113/74 | HR 61 | Temp 97.3°F | Resp 20 | Ht 72.0 in | Wt 181.4 lb

## 2016-10-31 DIAGNOSIS — I714 Abdominal aortic aneurysm, without rupture, unspecified: Secondary | ICD-10-CM

## 2016-10-31 NOTE — Progress Notes (Signed)
[facility]ist of [place]  Patient name: [name] MRN: [id number] DOB: 1930-02-29 Sex: male  REASON FOR VISIT: follow up AAA  HPI: [name] is a 82 y.o. male returns today for follow-up.  Previously, he has been followed by Dr. Kellie Simmering for a infrarenal abdominal aortic aneurysm.  He was last seen 6 months ago.  There have been no interval changes.  He denies any abdominal pain or back pain.  His aneurysm was detected incidentally approximately 10 years ago.  His most recent ultrasound showed a diameter of 4.4 cm.  The year prior it was 4.2 cm.  Past Medical History:  Diagnosis Date  . AAA (abdominal aortic aneurysm) ([place])   . Abnormal EKG    Left atrial abnormality  . Allergic rhinitis   . Benign neoplasm of colon 04/14/10   3 small polyps on colonoscopy by Dr. Henrene Pastor  . Carpal tunnel syndrome   . Degenerative disc disease   . Disturbances metabolism of methionine, homocystine, and cystathionine (HCC)    Elevated homocysteine  . Elevated prostate specific antigen (PSA)   . Hearing loss   . Hyperlipidemia   . Impotence of organic origin    Penile implant  . Internal hemorrhoids   . Migraine with aura, without mention of intractable migraine without mention of status migrainosus   . Neck pain   . Otosclerosis of both ears   . Peripheral vascular disease (HCC)    Bilateral femoral bruit  . Plantar fasciitis   . Polymyalgia rheumatica ([place])   . Rotator cuff syndrome of left shoulder   . Scoliosis   . Shoulder pain     Family History  Problem Relation Age of Onset  . Heart disease Mother   . Emphysema Mother     SOCIAL HISTORY: Social History  Substance Use Topics  . Smoking status: Never Smoker  . Smokeless tobacco: Never Used  . Alcohol use No    Allergies  Allergen Reactions  . Amoxicillin   . Codeine   . Doxycycline     Current Outpatient Prescriptions  Medication Sig Dispense Refill  .  acyclovir (ZOVIRAX) 400 MG tablet TAKE ONE TABLET TWICE DAILY DUE TO HERPES INFECTION 60 tablet 3  . aspirin 81 MG tablet Take 81 mg by mouth daily.    . butalbital-acetaminophen-caffeine (FIORICET, ESGIC) 50-325-40 MG tablet TAKE 1 OR 2 TABLETS UPTO 4 TIMES DAILY 30 tablet 0  . fish oil-omega-3 fatty acids 1000 MG capsule Take 1 g by mouth daily.     Marland Kitchen L-Methylfolate-B12-B6-B2 (METAFOLBIC) 04-28-49-5 MG TABS TAKE ONE TABLET TWICE DAILY 60 tablet 5  . Multiple Vitamins-Minerals (MULTIVITAMIN PO) Take by mouth.    . predniSONE (DELTASONE) 5 MG tablet TAKE 1/2 TO ONE TABLET DAILY TO PREVENT MYALGIAS 90 tablet 1  . rosuvastatin (CRESTOR) 5 MG tablet TAKE ONE TABLET BY MOUTH ONCE DAILY TO REDUCE CHOLESTEROL 90 tablet 1  . VITAMIN D, CHOLECALCIFEROL, PO Take by mouth.     No current facility-administered medications for this visit.     REVIEW OF SYSTEMS:  [X]  denotes positive finding, [ ]  denotes negative finding Cardiac  Comments:  Chest pain or chest pressure:    Shortness of breath upon exertion:    Short of breath when lying flat:    Irregular heart rhythm:        Vascular    Pain in calf, thigh, or hip brought on by ambulation:    Pain in feet at night that  wakes you up from your sleep:     Blood clot in your veins:    Leg swelling:         Pulmonary    Oxygen at home:    Productive cough:     Wheezing:         Neurologic    Sudden weakness in arms or legs:     Sudden numbness in arms or legs:     Sudden onset of difficulty speaking or slurred speech:    Temporary loss of vision in one eye:     Problems with dizziness:         Gastrointestinal    Blood in stool:     Vomited blood:         Genitourinary    Burning when urinating:     Blood in urine:        Psychiatric    Major depression:         Hematologic    Bleeding problems:    Problems with blood clotting too easily:        Skin    Rashes or ulcers:        Constitutional    Fever or chills:       PHYSICAL EXAM: Vitals:   10/31/16 0829  BP: 113/74  Pulse: 61  Resp: 20  Temp: 97.3 F (36.3 C)  TempSrc: Oral  SpO2: 100%  Weight: 181 lb 6.4 oz (82.3 kg)  Height: 6' (1.829 m)    GENERAL: The patient is a well-nourished male, in no acute distress. The vital signs are documented above. CARDIAC: There is a regular rate and rhythm.  VASCULAR: No carotid bruits PULMONARY: There is good air exchange bilaterally without wheezing or rales. ABDOMEN: Soft and non-tender with normal pitched bowel sounds.  MUSCULOSKELETAL: There are no major deformities or cyanosis. NEUROLOGIC: No focal weakness or paresthesias are detected. SKIN: There are no ulcers or rashes noted. PSYCHIATRIC: The patient has a normal affect.  DATA:  Ultrasound today was ordered and reviewed by myself.  This shows a maximum diameter of 4.7 cm  MEDICAL ISSUES: We discussed the indications for repair.  The patient stated that he would like to have his aneurysm fixed if medically indicated while he remains healthy.  I have elected to have him follow-up in 6 months with a CT angiogram to better define his anatomy as well as the true size of his aneurysm.    Annamarie Major, MD [facility] [phone] Pager [phone]

## 2016-11-03 ENCOUNTER — Other Ambulatory Visit: Payer: Self-pay | Admitting: Orthopedic Surgery

## 2016-11-03 ENCOUNTER — Ambulatory Visit
Admission: RE | Admit: 2016-11-03 | Discharge: 2016-11-03 | Disposition: A | Discharge status: Home / Self Care | Payer: Medicare Other | Source: Ambulatory Visit | Attending: Orthopedic Surgery | Admitting: Orthopedic Surgery

## 2016-11-03 DIAGNOSIS — R2232 Localized swelling, mass and lump, left upper limb: Secondary | ICD-10-CM

## 2016-11-03 DIAGNOSIS — R2231 Localized swelling, mass and lump, right upper limb: Secondary | ICD-10-CM

## 2016-11-09 DIAGNOSIS — R2232 Localized swelling, mass and lump, left upper limb: Secondary | ICD-10-CM | POA: Diagnosis not present

## 2016-12-12 ENCOUNTER — Other Ambulatory Visit: Payer: Self-pay | Admitting: Internal Medicine

## 2016-12-22 DIAGNOSIS — L570 Actinic keratosis: Secondary | ICD-10-CM | POA: Diagnosis not present

## 2016-12-22 DIAGNOSIS — L821 Other seborrheic keratosis: Secondary | ICD-10-CM | POA: Diagnosis not present

## 2016-12-22 DIAGNOSIS — Z85828 Personal history of other malignant neoplasm of skin: Secondary | ICD-10-CM | POA: Diagnosis not present

## 2016-12-29 ENCOUNTER — Telehealth: Payer: Self-pay

## 2016-12-29 MED ORDER — OSELTAMIVIR PHOSPHATE 75 MG PO CAPS: ORAL_CAPSULE | ORAL | 0 refills | Status: DC

## 2016-12-29 NOTE — Telephone Encounter (Signed)
Dr. Nyoka Cowden had message on his cell phone from patient. He has the flu and wants Tamiflu called to [facility]. Tamiflu 75mg  twice daily for 5 days. Call patient at [phone].

## 2017-01-11 ENCOUNTER — Ambulatory Visit (INDEPENDENT_AMBULATORY_CARE_PROVIDER_SITE_OTHER): Payer: Medicare Other | Admitting: Internal Medicine

## 2017-01-11 ENCOUNTER — Encounter: Payer: Self-pay | Admitting: Internal Medicine

## 2017-01-11 DIAGNOSIS — S61419A Laceration without foreign body of unspecified hand, initial encounter: Secondary | ICD-10-CM | POA: Diagnosis not present

## 2017-01-11 DIAGNOSIS — T07XXXA Unspecified multiple injuries, initial encounter: Secondary | ICD-10-CM | POA: Insufficient documentation

## 2017-01-11 NOTE — Progress Notes (Signed)
Facility  [place]    Place of Service:   OFFICE    Allergies  Allergen Reactions  . Amoxicillin   . Codeine   . Doxycycline     Chief Complaint  Patient presents with  . Acute Visit    Patient had a fall    HPI:  Acute visit for evaluation and treatment of injuries resulting from a fall. Simple fall without change in LOC. Wife says he has seemed more unsteady on his feet lately. Continues to work out and uses the treadmill without problems. He doe not feel his balance has changed,  Patient hit the sidewalk and has abrasion of the right cheek , upper lip, right elbow, right knuckles. Left knuckles. He has a larger skin tear with thin flap on the right thenar eminence.  No neck or back pain.  Last Tdap was 06/29/2010.  Medications: Patient's Medications  New Prescriptions   No medications on file  Previous Medications   ACYCLOVIR (ZOVIRAX) 400 MG TABLET    TAKE ONE TABLET TWICE DAILY DUE TO HERPES INFECTION   ASPIRIN 81 MG TABLET    Take 81 mg by mouth daily.   BUTALBITAL-ACETAMINOPHEN-CAFFEINE (FIORICET, ESGIC) 50-325-40 MG TABLET    TAKE 1 OR 2 TABLETS UPTO 4 TIMES DAILY   FISH OIL-OMEGA-3 FATTY ACIDS 1000 MG CAPSULE    Take 1 g by mouth daily.    L-METHYLFOLATE-B12-B6-B2 (METAFOLBIC) 04-28-49-5 MG TABS    TAKE ONE TABLET TWICE DAILY   MULTIPLE VITAMINS-MINERALS (MULTIVITAMIN PO)    Take by mouth.   OSELTAMIVIR (TAMIFLU) 75 MG CAPSULE    Take one tablet twice daily for 5 days for flu   PREDNISONE (DELTASONE) 5 MG TABLET    TAKE 1/2 TO ONE TABLET DAILY TO PREVENT MYALGIAS   ROSUVASTATIN (CRESTOR) 5 MG TABLET    TAKE ONE TABLET EACH DAY TO REDUCE CHOLESTEROL   VITAMIN D, CHOLECALCIFEROL, PO    Take by mouth.  Modified Medications   No medications on file  Discontinued Medications   No medications on file    Review of Systems  Constitutional: Negative for activity change, appetite change, fatigue, fever and unexpected weight change.  HENT: Positive for hearing loss.  Negative for ear pain, rhinorrhea, sinus pressure and sore throat.   Eyes: Positive for visual disturbance (corrective lenses).  Respiratory: Negative.  Negative for cough and shortness of breath.   Cardiovascular: Negative for chest pain, palpitations and leg swelling.       Hx AAA  Gastrointestinal: Negative.  Negative for nausea.  Endocrine: Negative.   Genitourinary: Positive for frequency.       Penile implant  Musculoskeletal: Negative.        Aches in hands. Left Dupuytren's at [address] [facility]. Left 5th flexion contracture.Scars right hand from prior repair of Dupuytren's. Pain and swelling right thumb IP joint.  Fall on 01/11/17 with multiple abrasions and skin tear of the right thenar eminence.  Allergic/Immunologic: Negative.   Neurological: Negative.   Hematological: Negative.   Psychiatric/Behavioral: Negative.     Vitals:   01/11/17 1428  BP: 120/70  Pulse: 65  Temp: 98.1 F (36.7 C)  SpO2: 97%   There is no height or weight on file to calculate BMI. Wt Readings from Last 3 Encounters:  10/31/16 181 lb 6.4 oz (82.3 kg)  09/28/16 182 lb (82.6 kg)  03/23/16 182 lb (82.6 kg)      Physical Exam  Constitutional: He is oriented to person, place, and time.  He appears well-developed and well-nourished. No distress.  HENT:  Head: Normocephalic.  Right Ear: External ear normal.  Left Ear: External ear normal.  Nose: Nose normal.  Mouth/Throat: Oropharynx is clear and moist.  Right ear normal. Left EAC has wax occlusion.  Eyes: Conjunctivae and EOM are normal. Pupils are equal, round, and reactive to light.  Contact lenses.  Neck: Neck supple. No JVD present. No tracheal deviation present. No thyromegaly present.  Cardiovascular: Normal rate, regular rhythm, normal heart sounds and intact distal pulses.  Exam reveals no gallop and no friction rub.   No murmur heard. 1/4 bilateral femoral bruit. Lungs/4 abdominal aortic bruit. Increase pulsation at the abdominal aorta.    Pulmonary/Chest: No respiratory distress. He has no wheezes. He has no rales.  Abdominal: He exhibits no distension and no mass. There is no tenderness.  Genitourinary: Rectum normal and prostate normal. Rectal exam shows guaiac negative stool. No penile tenderness.  Genitourinary Comments: Penile implant  Musculoskeletal: Normal range of motion. He exhibits no edema or tenderness.  Scoliosis Left Dupuytren's at [address] [facility]. Left 5th flexion contracture.Scars right hand from prior repair of Dupuytren's.  Mildly unsteady on standing.  Neurological: He is alert and oriented to person, place, and time. He displays normal reflexes. No cranial nerve deficit. Coordination normal.  09/28/16 MMSE 29/30. Failed clock drawing.  Skin: No rash noted. He is not diaphoretic. No erythema. No pallor.  abrasion of the right cheek , upper lip, right elbow, right knuckles. Left knuckles. He has a larger skin tear with thin flap on the right thenar eminence.   Psychiatric: He has a normal mood and affect. His behavior is normal. Judgment and thought content normal.    Labs reviewed: Lab Summary Latest Ref Rng & Units 09/26/2016 03/21/2016 09/21/2015  Hemoglobin 13.0-17.0 g/dL (None) (None) (None)  Hematocrit 39.0-52.0 % (None) (None) (None)  White count - (None) (None) (None)  Platelet count - (None) (None) (None)  Sodium 135 - 146 mmol/L 141 142 143  Potassium 3.5 - 5.3 mmol/L 4.3 4.4 4.6  Calcium 8.6 - 10.3 mg/dL 9.2 8.8 8.8  Phosphorus - (None) (None) (None)  Creatinine 0.70 - 1.11 mg/dL 1.17(H) 1.12 1.03  AST 10 - 35 U/L 25 30 35  Alk Phos 40 - 115 U/L 52 58 65  Bilirubin 0.2 - 1.2 mg/dL 0.7 0.4 0.4  Glucose 65 - 99 mg/dL 97 93 97  Cholesterol 125 - 200 mg/dL 148 (None) (None)  HDL cholesterol >=40 mg/dL 52 56 52  Triglycerides <150 mg/dL 71 68 44  LDL Direct - (None) (None) (None)  LDL Calc <130 mg/dL 82 68 66  Total protein 6.1 - 8.1 g/dL 6.4 (None) (None)  Albumin 3.6 - 5.1 g/dL 3.9 3.9 3.8  Some  recent data might be hidden   No results found for: TSH, T3TOTAL, T4TOTAL, THYROIDAB Lab Results  Component Value Date   BUN 27 (H) 09/26/2016   BUN 21 03/21/2016   BUN 19 09/21/2015   No results found for: HGBA1C  Assessment/Plan 1. Skin tear of hand without complication, initial encounter Cleansed with chlorhexidine. Steristrips to wound, Tegaderm.  2. Abrasion, multiple sites All wounds cleansed with chlorhexidine, then bandaged.

## 2017-01-12 ENCOUNTER — Other Ambulatory Visit: Payer: Self-pay | Admitting: Internal Medicine

## 2017-01-12 ENCOUNTER — Ambulatory Visit (INDEPENDENT_AMBULATORY_CARE_PROVIDER_SITE_OTHER): Payer: Medicare Other | Admitting: Nurse Practitioner

## 2017-01-12 VITALS — HR 68 | Temp 98.0°F | Resp 12 | Ht 72.0 in | Wt 185.0 lb

## 2017-01-12 DIAGNOSIS — S61419D Laceration without foreign body of unspecified hand, subsequent encounter: Secondary | ICD-10-CM | POA: Diagnosis not present

## 2017-01-12 NOTE — Progress Notes (Signed)
Chief Complaint  Patient presents with  . Nurse Visit    Walk-in bandage change. Patient was seen yesterday following a fall, patient returned today due soiled bandage.   . Observation    Right hand bandge was soiled, removed, skin tear cleaned, replaced steri-strips, rebandage with telfa, kerlix, and coban. No signs of infection.    1.) Patient to return for follow-up with NP on Monday @ 8:30 am   2.) Seek medical attention if signs of infection (greenish-colored pus, increased swelling, tenderness, pain, or fever) develop.

## 2017-01-12 NOTE — Patient Instructions (Signed)
1.) Patient to return for follow-up with NP on Monday @ 8:30 am   2.) Seek medical attention if signs of infection (greenish-colored pus, increased swelling, tenderness, pain, or fever) develop.

## 2017-01-16 ENCOUNTER — Ambulatory Visit (INDEPENDENT_AMBULATORY_CARE_PROVIDER_SITE_OTHER): Payer: Medicare Other | Admitting: Nurse Practitioner

## 2017-01-16 ENCOUNTER — Encounter: Payer: Self-pay | Admitting: Nurse Practitioner

## 2017-01-16 VITALS — BP 122/76 | HR 64 | Temp 97.7°F | Resp 18 | Ht 72.0 in | Wt 183.4 lb

## 2017-01-16 DIAGNOSIS — S61419D Laceration without foreign body of unspecified hand, subsequent encounter: Secondary | ICD-10-CM | POA: Diagnosis not present

## 2017-01-16 NOTE — Patient Instructions (Signed)
Change dresses 1-2 times daily

## 2017-01-16 NOTE — Progress Notes (Signed)
Careteam: Patient Care Team: Estill Dooms, MD as PCP - General (Internal Medicine) Marilynne Halsted, MD as Referring Physician (Ophthalmology) Mal Misty, MD as Consulting Physician (Vascular Surgery) Irene Shipper, MD as Consulting Physician (Gastroenterology)  Advanced Directive information Does Patient Have a Medical Advance Directive?: Yes, Type of Advance Directive: [place];Living will  Allergies  Allergen Reactions  . Amoxicillin   . Codeine   . Doxycycline     Chief Complaint  Patient presents with  . Medical Management of Chronic Issues    Recheck of right hand wound     HPI: Patient is a 83 y.o. male seen in the office today to recheck wounds from fall. Reports he was not looking where he was going and tripped over a curb. Reports wounds are doing much better. No drainage, odor or pain.  Has right hand wound and left finger abrasions.  Taking dressings off 1-2 times daily and cleaning with dove soap and then redressing .  No fevers.  Review of Systems:  Review of Systems  Constitutional: Negative for activity change, appetite change, fatigue, fever and unexpected weight change.  HENT: Positive for hearing loss. Negative for ear pain.   Eyes: Positive for visual disturbance (corrective lenses).  Respiratory: Negative.  Negative for cough and shortness of breath.   Cardiovascular: Negative for chest pain, palpitations and leg swelling.       Hx AAA  Gastrointestinal: Negative.   Endocrine: Negative.   Musculoskeletal: Negative.        Left Dupuytren's at [address] [facility]. Left 5th flexion contracture.Scars right hand from prior repair of Dupuytren's. No Pain and swelling Fall on 01/11/17 with multiple abrasions and skin tear of the right thenar eminence- healing well  Allergic/Immunologic: Negative.   Neurological: Negative.   Hematological: Negative.   Psychiatric/Behavioral: Negative.     Past Medical History:  Diagnosis Date  . AAA  (abdominal aortic aneurysm) ([place])   . Abnormal EKG    Left atrial abnormality  . Allergic rhinitis   . Benign neoplasm of colon 04/14/10   3 small polyps on colonoscopy by Dr. Henrene Pastor  . Carpal tunnel syndrome   . Degenerative disc disease   . Disturbances metabolism of methionine, homocystine, and cystathionine (HCC)    Elevated homocysteine  . Elevated prostate specific antigen (PSA)   . Hearing loss   . Hyperlipidemia   . Impotence of organic origin    Penile implant  . Internal hemorrhoids   . Migraine with aura, without mention of intractable migraine without mention of status migrainosus   . Neck pain   . Otosclerosis of both ears   . Peripheral vascular disease (HCC)    Bilateral femoral bruit  . Plantar fasciitis   . Polymyalgia rheumatica ([place])   . Rotator cuff syndrome of left shoulder   . Scoliosis   . Shoulder pain    Past Surgical History:  Procedure Laterality Date  . Actinic keratosis removal  06/21/2010   Left shoulder; Dr. Sarajane Jews  . COLONOSCOPY W/ POLYPECTOMY    . DUPUYTREN CONTRACTURE RELEASE Right 12/05/2013   Procedure: DUPUYTREN CONTRACTURE RELEASE RIGHT LONG, RING AND SMALL FINGERS;  Surgeon: Cammie Sickle., MD;  Location: [place];  Service: Orthopedics;  Laterality: Right;  . Implant penile pump  2001  . Resection of appendix and tip of rectum  February 2005  . STAPEDECTOMY Bilateral 1985, [place]   Social History:   reports that  he has never smoked. He has never used smokeless tobacco. He reports that he does not drink alcohol or use drugs.  Family History  Problem Relation Age of Onset  . Heart disease Mother   . Emphysema Mother     Medications: Patient's Medications  New Prescriptions   No medications on file  Previous Medications   ACYCLOVIR (ZOVIRAX) 400 MG TABLET    TAKE ONE TABLET TWICE DAILY DUE TO HERPES INFECTION   ASPIRIN 81 MG TABLET    Take 81 mg by mouth daily.    BUTALBITAL-ACETAMINOPHEN-CAFFEINE (FIORICET, ESGIC) 50-325-40 MG TABLET    TAKE 1 OR 2 TABLETS UPTO 4 TIMES DAILY   FISH OIL-OMEGA-3 FATTY ACIDS 1000 MG CAPSULE    Take 1 g by mouth daily.    L-METHYLFOLATE-B12-B6-B2 (METAFOLBIC) 04-28-49-5 MG TABS    TAKE ONE TABLET TWICE DAILY   MULTIPLE VITAMINS-MINERALS (MULTIVITAMIN PO)    Take by mouth.   OSELTAMIVIR (TAMIFLU) 75 MG CAPSULE    Take one tablet twice daily for 5 days for flu   PREDNISONE (DELTASONE) 5 MG TABLET    TAKE 1/2 TO ONE TABLET DAILY TO PREVENT MYALGIAS   ROSUVASTATIN (CRESTOR) 5 MG TABLET    TAKE ONE TABLET EACH DAY TO REDUCE CHOLESTEROL   VITAMIN D, CHOLECALCIFEROL, PO    Take by mouth.  Modified Medications   No medications on file  Discontinued Medications   No medications on file     Physical Exam:  Vitals:   01/16/17 0837  BP: 122/76  Pulse: 64  Resp: 18  Temp: 97.7 F (36.5 C)  TempSrc: Oral  SpO2: 97%  Weight: 183 lb 6.4 oz (83.2 kg)  Height: 6' (1.829 m)   Body mass index is 24.87 kg/m.  Physical Exam  Constitutional: He is oriented to person, place, and time. He appears well-developed and well-nourished. No distress.  HENT:  Head: Normocephalic.  Right ear normal. Left EAC has wax occlusion.  Eyes: Conjunctivae and EOM are normal. Pupils are equal, round, and reactive to light.  Contact lenses.  Neck: Neck supple.  Cardiovascular: Normal rate, regular rhythm and normal heart sounds.   1/4 bilateral femoral bruit. Lungs/4 abdominal aortic bruit. Increase pulsation at the abdominal aorta.  Pulmonary/Chest: Effort normal and breath sounds normal.  Genitourinary: Rectum normal and prostate normal. Rectal exam shows guaiac negative stool. No penile tenderness.  Genitourinary Comments: Penile implant  Musculoskeletal: Normal range of motion. He exhibits no edema or tenderness.  Scoliosis Left Dupuytren's at [address] [facility]. Left 5th flexion contracture.Scars right hand from prior repair of Dupuytren's.  Mildly  unsteady on standing.  Neurological: He is alert and oriented to person, place, and time.  09/28/16 MMSE 29/30. Failed clock drawing.  Skin: He is not diaphoretic.  Abrasions noted to 2nd, 3rd and 4th finger on left hand, healing well mild erythema, no drainage or odor.  Skin tear to left palm with steristrips, well approximated, no drainage, odor or pain.     Psychiatric: He has a normal mood and affect. His behavior is normal. Judgment and thought content normal.   Labs reviewed: Basic Metabolic Panel:  Recent Labs  03/21/16 0819 09/26/16 0804  NA 142 141  K 4.4 4.3  CL 102 106  CO2 26 28  GLUCOSE 93 97  BUN 21 27*  CREATININE 1.12 1.17*  CALCIUM 8.8 9.2   Liver Function Tests:  Recent Labs  03/21/16 0819 09/26/16 0804  AST 30 25  ALT 25 20  ALKPHOS 58  52  BILITOT 0.4 0.7  PROT 6.4 6.4  ALBUMIN 3.9 3.9   No results for input(s): LIPASE, AMYLASE in the last 8760 hours. No results for input(s): AMMONIA in the last 8760 hours. CBC: No results for input(s): WBC, NEUTROABS, HGB, HCT, MCV, PLT in the last 8760 hours. Lipid Panel:  Recent Labs  03/21/16 0819 09/26/16 0804  CHOL 138 148  HDL 56 52  LDLCALC 68 82  TRIG 68 71  CHOLHDL 2.5 2.8   TSH: No results for input(s): TSH in the last 8760 hours. A1C: No results found for: HGBA1C   Assessment/Plan 1. Skin tear of hand without complication, unspecified laterality, subsequent encounter Improving, no pain, swelling or drainage -to cont left hand dressing changes as he is doing- okay to leave open to air when at home -to use nonstick to right hand, to leave open to air when home. Okay to get wet when washing hands and use mild soap to wash over skin tear.   Follow up as needed, drainage, heat, pain or swelling occurs    Ranisha Allaire K. Harle Battiest  [facility] [phone] 8 am - 5 pm) [phone] (after hours)

## 2017-01-24 ENCOUNTER — Other Ambulatory Visit: Payer: Self-pay | Admitting: Internal Medicine

## 2017-02-02 ENCOUNTER — Other Ambulatory Visit: Payer: Self-pay | Admitting: Internal Medicine

## 2017-03-14 ENCOUNTER — Encounter: Payer: Self-pay | Admitting: Internal Medicine

## 2017-03-14 ENCOUNTER — Other Ambulatory Visit: Payer: Self-pay | Admitting: Internal Medicine

## 2017-03-14 DIAGNOSIS — J329 Chronic sinusitis, unspecified: Secondary | ICD-10-CM | POA: Insufficient documentation

## 2017-03-14 DIAGNOSIS — J0140 Acute pansinusitis, unspecified: Secondary | ICD-10-CM

## 2017-03-14 MED ORDER — AZITHROMYCIN 250 MG PO TABS: ORAL_TABLET | ORAL | 0 refills | Status: DC

## 2017-03-14 MED ORDER — DM-GUAIFENESIN ER 30-600 MG PO TB12: ORAL_TABLET | ORAL | 1 refills | Status: DC

## 2017-03-15 DIAGNOSIS — H1032 Unspecified acute conjunctivitis, left eye: Secondary | ICD-10-CM | POA: Diagnosis not present

## 2017-03-15 DIAGNOSIS — H17821 Peripheral opacity of cornea, right eye: Secondary | ICD-10-CM | POA: Diagnosis not present

## 2017-03-15 DIAGNOSIS — H2513 Age-related nuclear cataract, bilateral: Secondary | ICD-10-CM | POA: Diagnosis not present

## 2017-03-28 ENCOUNTER — Encounter: Payer: Self-pay | Admitting: Internal Medicine

## 2017-03-28 ENCOUNTER — Ambulatory Visit (INDEPENDENT_AMBULATORY_CARE_PROVIDER_SITE_OTHER): Payer: Medicare Other | Admitting: Internal Medicine

## 2017-03-28 VITALS — BP 104/70 | HR 70 | Temp 97.5°F | Ht 72.0 in | Wt 183.0 lb

## 2017-03-28 DIAGNOSIS — I714 Abdominal aortic aneurysm, without rupture, unspecified: Secondary | ICD-10-CM

## 2017-03-28 DIAGNOSIS — M19042 Primary osteoarthritis, left hand: Secondary | ICD-10-CM

## 2017-03-28 DIAGNOSIS — E785 Hyperlipidemia, unspecified: Secondary | ICD-10-CM | POA: Diagnosis not present

## 2017-03-28 DIAGNOSIS — R1011 Right upper quadrant pain: Secondary | ICD-10-CM | POA: Insufficient documentation

## 2017-03-28 MED ORDER — LIDOCAINE 4 % EX CREA: TOPICAL_CREAM | CUTANEOUS | 0 refills | Status: DC

## 2017-03-28 NOTE — Progress Notes (Signed)
Facility  [place]    Place of Service:   OFFICE    Allergies  Allergen Reactions  . Amoxicillin   . Codeine   . Doxycycline     Chief Complaint  Patient presents with  . Medical Management of Chronic Issues    6 month medication management AAA, cholesterol, polymyalgia rheumatica.    HPI:   Abdominal aortic aneurysm (AAA) without rupture ([place]) - scheduled for CT angio 05/01/17. To have lab prior.  Abdominal pain, RUQ - for the last few weeks has had some sharp pains that do not last long when he gets up in the morning. No nausea, change in bowel habits blood in urine or stool, belching, fatty food intolerance.  Hyperlipidemia, unspecified hyperlipidemia type - need s lab prior to next visit  Osteoarthritis of finger of left hand - pain iin the left middle finger whrn playing golf.    Medications: Patient's Medications  New Prescriptions   No medications on file  Previous Medications   ACYCLOVIR (ZOVIRAX) 400 MG TABLET    TAKE ONE TABLET TWICE DAILY DUE TO HERPES INFECTION   ASPIRIN 81 MG TABLET    Take 81 mg by mouth daily.   BUTALBITAL-ACETAMINOPHEN-CAFFEINE (FIORICET, ESGIC) 50-325-40 MG TABLET    TAKE 1 OR 2 TABLETS UPTO 4 TIMES DAILY   FISH OIL-OMEGA-3 FATTY ACIDS 1000 MG CAPSULE    Take 1 g by mouth daily.    L-METHYLFOLATE-B12-B6-B2 (METAFOLBIC) 04-28-49-5 MG TABS    TAKE ONE TABLET TWICE DAILY   MULTIPLE VITAMINS-MINERALS (MULTIVITAMIN PO)    Take by mouth.   PREDNISONE (DELTASONE) 5 MG TABLET    TAKE 1/2 TO ONE TABLET DAILY TO PREVENT MYALGIAS   ROSUVASTATIN (CRESTOR) 5 MG TABLET    TAKE ONE TABLET EACH DAY TO REDUCE CHOLESTEROL   VITAMIN D, CHOLECALCIFEROL, PO    Take by mouth.  Modified Medications   No medications on file  Discontinued Medications   AZITHROMYCIN (ZITHROMAX) 250 MG TABLET    2 initially, then 1 daily for infection   DEXTROMETHORPHAN-GUAIFENESIN (MUCINEX DM) 30-600 MG 12HR TABLET    One twice daily to suppress cough and congestion    Review  of Systems  Constitutional: Negative for activity change, appetite change, fatigue, fever and unexpected weight change.  HENT: Positive for hearing loss. Negative for congestion, ear pain, rhinorrhea, sore throat, tinnitus, trouble swallowing and voice change.   Eyes: Positive for visual disturbance (corrective lenses).       Corrective lenses  Respiratory: Negative for cough, choking, chest tightness, shortness of breath and wheezing.   Cardiovascular: Negative for chest pain, palpitations and leg swelling.       Hx AAA  Gastrointestinal: Positive for abdominal pain (RUQ). Negative for abdominal distention, constipation, diarrhea and nausea.       Pain in the RUQ  Endocrine: Negative.  Negative for cold intolerance, heat intolerance, polydipsia, polyphagia and polyuria.  Genitourinary: Negative for dysuria, frequency, testicular pain and urgency.       Penile implant  Musculoskeletal: Negative for arthralgias, back pain, gait problem, myalgias and neck pain.       Left Dupuytren's at [address] [facility]. Left 5th flexion contracture.Scars right hand from prior repair of Dupuytren's. No Pain and swelling Fall on 01/11/17 with multiple abrasions and skin tear of the right thenar eminence- healed. History of PMR. Remains o low dose prednisone.  Skin: Negative for color change, pallor and rash.  Allergic/Immunologic: Negative.   Neurological: Negative for dizziness, tremors, syncope,  speech difficulty, weakness, numbness and headaches.  Hematological: Negative for adenopathy. Does not bruise/bleed easily.  Psychiatric/Behavioral: Negative for behavioral problems, confusion, decreased concentration, hallucinations and sleep disturbance. The patient is not nervous/anxious.     Vitals:   03/28/17 1406  BP: 104/70  Pulse: 70  Temp: 97.5 F (36.4 C)  TempSrc: Oral  SpO2: 98%  Weight: 183 lb (83 kg)  Height: 6' (1.829 m)   Body mass index is 24.82 kg/m. Wt Readings from Last 3 Encounters:  03/28/17  183 lb (83 kg)  01/16/17 183 lb 6.4 oz (83.2 kg)  01/12/17 185 lb (83.9 kg)      Physical Exam  Constitutional: He is oriented to person, place, and time. He appears well-developed and well-nourished. No distress.  HENT:  Head: Normocephalic.  Right Ear: External ear normal.  Left Ear: External ear normal.  Nose: Nose normal.  Mouth/Throat: Oropharynx is clear and moist. No oropharyngeal exudate.  Right ear normal. Left EAC has wax occlusion.  Eyes: Conjunctivae and EOM are normal. Pupils are equal, round, and reactive to light.  Contact lenses.  Neck: Neck supple. No JVD present. No tracheal deviation present. No thyromegaly present.  Cardiovascular: Normal rate, regular rhythm, normal heart sounds and intact distal pulses.  Exam reveals no gallop and no friction rub.   No murmur heard. 1/4 bilateral femoral bruit.  2/4 abdominal aortic bruit. Increase pulsation at the abdominal aorta.  Pulmonary/Chest: Effort normal and breath sounds normal. No respiratory distress. He has no wheezes. He has no rales. He exhibits no tenderness.  Abdominal: He exhibits no distension and no mass. There is no tenderness.  Genitourinary: Rectum normal and prostate normal. Rectal exam shows guaiac negative stool. No penile tenderness.  Genitourinary Comments: Penile implant  Musculoskeletal: Normal range of motion. He exhibits no edema or tenderness.  Scoliosis Left Dupuytren's at [address] [facility]. Left 5th flexion contracture.Scars right hand from prior repair of Dupuytren's.  Mildly unsteady on standing. Tender left PIP joint and slight swelling.  Lymphadenopathy:    He has no cervical adenopathy.  Neurological: He is alert and oriented to person, place, and time. He has normal reflexes. No cranial nerve deficit. Coordination normal.  09/28/16 MMSE 29/30. Failed clock drawing.  Skin: No rash noted. He is not diaphoretic. No erythema. No pallor.  Psychiatric: He has a normal mood and affect. His behavior is  normal. Judgment and thought content normal.    Labs reviewed: Lab Summary Latest Ref Rng & Units 09/26/2016 03/21/2016  Hemoglobin 13.0-17.0 g/dL (None) (None)  Hematocrit 39.0-52.0 % (None) (None)  White count - (None) (None)  Platelet count - (None) (None)  Sodium 135 - 146 mmol/L 141 142  Potassium 3.5 - 5.3 mmol/L 4.3 4.4  Calcium 8.6 - 10.3 mg/dL 9.2 8.8  Phosphorus - (None) (None)  Creatinine 0.70 - 1.11 mg/dL 1.17(H) 1.12  AST 10 - 35 U/L 25 30  Alk Phos 40 - 115 U/L 52 58  Bilirubin 0.2 - 1.2 mg/dL 0.7 0.4  Glucose 65 - 99 mg/dL 97 93  Cholesterol 125 - 200 mg/dL 148 (None)  HDL cholesterol >=40 mg/dL 52 56  Triglycerides <150 mg/dL 71 68  LDL Direct - (None) (None)  LDL Calc <130 mg/dL 82 68  Total protein 6.1 - 8.1 g/dL 6.4 (None)  Albumin 3.6 - 5.1 g/dL 3.9 3.9  Some recent data might be hidden   No results found for: TSH, T3TOTAL, T4TOTAL, THYROIDAB Lab Results  Component Value Date   BUN 27 (  H) 09/26/2016   BUN 21 03/21/2016   BUN 19 09/21/2015   No results found for: HGBA1C  Assessment/Plan  1. Abdominal aortic aneurysm (AAA) without rupture ([place]) Scheduled for CT angio 6/4/218  2. Abdominal pain, RUQ I suspect the CT will include the RUQ as well. -CMP, future  3. Hyperlipidemia, unspecified hyperlipidemia type -lipids , future  4. Osteoarthritis of finger of left hand - lidocaine (ASPERCREME W/LIDOCAINE) 4 % cream; Apply to painful joints up to 4 times daily  Dispense: 30 g; Refill: 0

## 2017-04-03 ENCOUNTER — Encounter: Payer: Self-pay | Admitting: Internal Medicine

## 2017-04-03 ENCOUNTER — Other Ambulatory Visit: Payer: Self-pay | Admitting: Internal Medicine

## 2017-04-03 DIAGNOSIS — R5383 Other fatigue: Secondary | ICD-10-CM

## 2017-04-03 DIAGNOSIS — R531 Weakness: Secondary | ICD-10-CM | POA: Insufficient documentation

## 2017-04-04 ENCOUNTER — Other Ambulatory Visit: Payer: Medicare Other

## 2017-04-04 ENCOUNTER — Other Ambulatory Visit: Payer: Self-pay

## 2017-04-04 DIAGNOSIS — R5383 Other fatigue: Secondary | ICD-10-CM | POA: Diagnosis not present

## 2017-04-04 LAB — CBC WITH DIFFERENTIAL/PLATELET
BASOS ABS: 0 {cells}/uL (ref 0–200)
Basophils Relative: 0 %
EOS ABS: 152 {cells}/uL (ref 15–500)
Eosinophils Relative: 2 %
HEMATOCRIT: 42.9 % (ref 38.5–50.0)
HEMOGLOBIN: 14.4 g/dL (ref 13.2–17.1)
LYMPHS ABS: 1748 {cells}/uL (ref 850–3900)
Lymphocytes Relative: 23 %
MCH: 33.7 pg — ABNORMAL HIGH (ref 27.0–33.0)
MCHC: 33.6 g/dL (ref 32.0–36.0)
MCV: 100.5 fL — AB (ref 80.0–100.0)
MONO ABS: 608 {cells}/uL (ref 200–950)
MPV: 10.3 fL (ref 7.5–12.5)
Monocytes Relative: 8 %
NEUTROS ABS: 5092 {cells}/uL (ref 1500–7800)
NEUTROS PCT: 67 %
Platelets: 243 10*3/uL (ref 140–400)
RBC: 4.27 MIL/uL (ref 4.20–5.80)
RDW: 14.1 % (ref 11.0–15.0)
WBC: 7.6 10*3/uL (ref 3.8–10.8)

## 2017-04-04 LAB — COMPREHENSIVE METABOLIC PANEL
ALBUMIN: 3.6 g/dL (ref 3.6–5.1)
ALT: 22 U/L (ref 9–46)
AST: 30 U/L (ref 10–35)
Alkaline Phosphatase: 54 U/L (ref 40–115)
BUN: 26 mg/dL — ABNORMAL HIGH (ref 7–25)
CALCIUM: 8.7 mg/dL (ref 8.6–10.3)
CHLORIDE: 108 mmol/L (ref 98–110)
CO2: 26 mmol/L (ref 20–31)
CREATININE: 1.13 mg/dL — AB (ref 0.70–1.11)
Glucose, Bld: 98 mg/dL (ref 65–99)
POTASSIUM: 4.5 mmol/L (ref 3.5–5.3)
SODIUM: 141 mmol/L (ref 135–146)
Total Bilirubin: 0.6 mg/dL (ref 0.2–1.2)
Total Protein: 6.1 g/dL (ref 6.1–8.1)

## 2017-04-04 LAB — TSH: TSH: 1.81 mIU/L (ref 0.40–4.50)

## 2017-04-05 LAB — SEDIMENTATION RATE: Sed Rate: 4 mm/hr (ref 0–20)

## 2017-04-07 ENCOUNTER — Other Ambulatory Visit: Payer: Self-pay | Admitting: Internal Medicine

## 2017-04-07 NOTE — Telephone Encounter (Signed)
rx called into pharmacy

## 2017-05-01 ENCOUNTER — Ambulatory Visit: Payer: Medicare Other | Admitting: Surgery

## 2017-05-01 ENCOUNTER — Ambulatory Visit
Admission: RE | Admit: 2017-05-01 | Discharge: 2017-05-01 | Disposition: A | Discharge status: Home / Self Care | Payer: Medicare Other | Source: Ambulatory Visit | Attending: Surgery | Admitting: Surgery

## 2017-05-01 DIAGNOSIS — I714 Abdominal aortic aneurysm, without rupture, unspecified: Secondary | ICD-10-CM

## 2017-05-01 DIAGNOSIS — I712 Thoracic aortic aneurysm, without rupture: Secondary | ICD-10-CM | POA: Diagnosis not present

## 2017-05-01 MED ORDER — IOPAMIDOL (ISOVUE-370) INJECTION 76%
75.0000 mL | Freq: Once | INTRAVENOUS | Status: AC | PRN
  Administered 2017-05-01: 75 mL via INTRAVENOUS

## 2017-05-08 ENCOUNTER — Encounter: Payer: Self-pay | Admitting: Surgery

## 2017-05-08 ENCOUNTER — Ambulatory Visit: Payer: Medicare Other | Admitting: Surgery

## 2017-05-15 ENCOUNTER — Ambulatory Visit (INDEPENDENT_AMBULATORY_CARE_PROVIDER_SITE_OTHER): Payer: Medicare Other | Admitting: Surgery

## 2017-05-15 ENCOUNTER — Encounter: Payer: Self-pay | Admitting: Surgery

## 2017-05-15 VITALS — BP 114/68 | HR 61 | Temp 97.6°F | Resp 16 | Ht 72.0 in | Wt 182.0 lb

## 2017-05-15 DIAGNOSIS — I679 Cerebrovascular disease, unspecified: Secondary | ICD-10-CM

## 2017-05-15 DIAGNOSIS — I714 Abdominal aortic aneurysm, without rupture, unspecified: Secondary | ICD-10-CM

## 2017-05-15 DIAGNOSIS — I739 Peripheral vascular disease, unspecified: Secondary | ICD-10-CM | POA: Diagnosis not present

## 2017-05-15 NOTE — Progress Notes (Signed)
[facility] [place]  Patient name: [name] MRN: [id number] DOB: [birth year] Sex: male   REASON FOR VISIT:    Follow up AAA  HISOTRY OF PRESENT ILLNESS:    [name] is a 83 y.o. male who returns today for follow-up of his abdominal aortic aneurysm.  He has previously been followed for approximately 10 years by Dr. Kellie Simmering.  His ultrasound 6 months ago showed a maximum diameter 4.7 cm.  He is here today for CT scan.  He reports having had a cold a few months ago and is still having difficulty with energy levels.  He notices a pain in his back that he has but is alleviated by stretching exercises in the morning.  He takes a statin for hypercholesterolemia.  He remains very active with exercise.   PAST MEDICAL HISTORY:   Past Medical History:  Diagnosis Date  . AAA (abdominal aortic aneurysm) ([place])   . Abnormal EKG    Left atrial abnormality  . Allergic rhinitis   . Benign neoplasm of colon 04/14/10   3 small polyps on colonoscopy by Dr. Henrene Pastor  . Carpal tunnel syndrome   . Degenerative disc disease   . Disturbances metabolism of methionine, homocystine, and cystathionine (HCC)    Elevated homocysteine  . Elevated prostate specific antigen (PSA)   . Hearing loss   . Hyperlipidemia   . Impotence of organic origin    Penile implant  . Internal hemorrhoids   . Migraine with aura, without mention of intractable migraine without mention of status migrainosus   . Neck pain   . Otosclerosis of both ears   . Peripheral vascular disease (HCC)    Bilateral femoral bruit  . Plantar fasciitis   . Polymyalgia rheumatica ([place])   . Rotator cuff syndrome of left shoulder   . Scoliosis   . Shoulder pain      FAMILY HISTORY:   Family History  Problem Relation Age of Onset  . Heart disease Mother   . Emphysema Mother     SOCIAL HISTORY:   Social History  Substance Use Topics  . Smoking status: Never Smoker    . Smokeless tobacco: Never Used  . Alcohol use No     ALLERGIES:   Allergies  Allergen Reactions  . Amoxicillin   . Codeine   . Doxycycline      CURRENT MEDICATIONS:   Current Outpatient Prescriptions  Medication Sig Dispense Refill  . acyclovir (ZOVIRAX) 400 MG tablet TAKE ONE TABLET TWICE DAILY DUE TO HERPES INFECTION 60 tablet 5  . aspirin 81 MG tablet Take 81 mg by mouth daily.    . butalbital-acetaminophen-caffeine (FIORICET, ESGIC) 50-325-40 MG tablet TAKE 1 OR 2 TABLETS UPTO 4 TIMES DAILY 30 tablet 0  . fish oil-omega-3 fatty acids 1000 MG capsule Take 1 g by mouth daily.     Marland Kitchen L-Methylfolate-B12-B6-B2 (METAFOLBIC) 04-28-49-5 MG TABS TAKE ONE TABLET TWICE DAILY 60 tablet 5  . lidocaine (ASPERCREME W/LIDOCAINE) 4 % cream Apply to painful joints up to 4 times daily 30 g 0  . Multiple Vitamins-Minerals (MULTIVITAMIN PO) Take by mouth.    . predniSONE (DELTASONE) 5 MG tablet TAKE 1/2 TO ONE TABLET DAILY TO PREVENT MYALGIAS 90 tablet 1  . rosuvastatin (CRESTOR) 5 MG tablet TAKE ONE TABLET EACH DAY TO REDUCE CHOLESTEROL 90 tablet 1  . VITAMIN D, CHOLECALCIFEROL, PO Take by mouth.     No current facility-administered medications for this visit.  REVIEW OF SYSTEMS:   [X]  denotes positive finding, [ ]  denotes negative finding Cardiac  Comments:  Chest pain or chest pressure:    Shortness of breath upon exertion:    Short of breath when lying flat:    Irregular heart rhythm:        Vascular    Pain in calf, thigh, or hip brought on by ambulation:    Pain in feet at night that wakes you up from your sleep:     Blood clot in your veins:    Leg swelling:         Pulmonary    Oxygen at home:    Productive cough:     Wheezing:         Neurologic    Sudden weakness in arms or legs:     Sudden numbness in arms or legs:     Sudden onset of difficulty speaking or slurred speech:    Temporary loss of vision in one eye:     Problems with dizziness:          Gastrointestinal    Blood in stool:     Vomited blood:         Genitourinary    Burning when urinating:     Blood in urine:        Psychiatric    Major depression:         Hematologic    Bleeding problems:    Problems with blood clotting too easily:        Skin    Rashes or ulcers:        Constitutional    Fever or chills:      PHYSICAL EXAM:   Vitals:   05/15/17 1031  BP: 114/68  Pulse: 61  Resp: 16  Temp: 97.6 F (36.4 C)  TempSrc: Oral  SpO2: 98%  Weight: 182 lb (82.6 kg)  Height: 6' (1.829 m)    GENERAL: The patient is a well-nourished male, in no acute distress. The vital signs are documented above. CARDIAC: There is a regular rate and rhythm.  VASCULAR: Palpable pedal pulses.  No carotid bruits. PULMONARY: Non-labored respirations ABDOMEN: Soft and non-tender.  Aneurysm is easily palpable and nontender  MUSCULOSKELETAL: There are no major deformities or cyanosis. NEUROLOGIC: No focal weakness or paresthesias are detected. SKIN: There are no ulcers or rashes noted. PSYCHIATRIC: The patient has a normal affect.  STUDIES:   I have reviewed his CT scan with the following findings: 1. Highly ectatic and mildly aneurysmal thoracic aorta. The tubular portion of the ascending thoracic aorta measures up to 4.4 cm in diameter while the proximal descending thoracic aorta measures up to 4.5 cm in diameter. Recommend annual imaging followup by CTA or MRA. This recommendation follows 2010 ACCF/AHA/AATS/ACR/ASA/SCA/SCAI/SIR/STS/SVM Guidelines for the Diagnosis and Management of Patients with Thoracic Aortic Disease. Circulation. 2010; 121: P102-H852. 2. Coronary artery calcifications. 3.  Aortic Atherosclerosis (ICD10-170.0). 4. Focal 9 mm ground-glass attenuation nodular opacity in the superior segment of the right lower lobe. Initial follow-up with CT at 6-12 months is recommended to confirm persistence. If persistent, repeat CT is recommended every 2 years  until 5 years of stability has been established. This recommendation follows the consensus statement: Guidelines for Management of Incidental Pulmonary Nodules Detected on CT Images:From the Fleischner Society 2017; published online before print (10.1148/radiol.[id number]). 5. Biapical and peripheral subpleural scarring.  CTA ABD/PELVIS  1. Fusiform aneurysmal dilatation of the infrarenal abdominal aorta with a maximal transverse diameter  of 5.2 cm (see sagittally reformatted images). Aortic aneurysm NOS (ICD10-I71.9). 2. Ectatic bilateral common iliac arteries (right greater than left), left internal iliac artery, and right common femoral artery as described above. 3. Beaded appearance most suggestive of fibromuscular dysplasia involving both of the co-dominant left renal arteries as well as both external iliac arteries. 4. Renal and hepatic cysts. 5. Multilevel degenerative disc disease throughout the lumbar spine. 6. Additionally ancillary findings as above.  MEDICAL ISSUES:   AAA:  We discussed proceeding with endovascular repair of his abdominal aortic aneurysm with maximum diameter 5.2 cm.  I feel he is a good candidate for endovascular repair.  I diagrammed out the details of the procedure as well as the risk of endoleak.  He is scheduling a cough trip in early August to [place], and therefore we will schedule repair once he returns.  I am going to see him in late August and we'll get a carotid duplex as well as lower extremity vascular studies    Annamarie Major, MD [facility] [phone] Pager [phone]

## 2017-05-16 NOTE — Addendum Note (Signed)
Addended by: Lianne Cure A on: 05/16/2017 03:29 PM   Modules accepted: Orders

## 2017-05-18 DIAGNOSIS — D485 Neoplasm of uncertain behavior of skin: Secondary | ICD-10-CM | POA: Diagnosis not present

## 2017-05-18 DIAGNOSIS — Z85828 Personal history of other malignant neoplasm of skin: Secondary | ICD-10-CM | POA: Diagnosis not present

## 2017-05-24 ENCOUNTER — Ambulatory Visit (INDEPENDENT_AMBULATORY_CARE_PROVIDER_SITE_OTHER): Payer: Medicare Other | Admitting: Nurse Practitioner

## 2017-05-24 ENCOUNTER — Encounter: Payer: Self-pay | Admitting: Nurse Practitioner

## 2017-05-24 VITALS — BP 112/78 | HR 70 | Temp 97.9°F | Resp 17 | Ht 72.0 in | Wt 181.6 lb

## 2017-05-24 DIAGNOSIS — R5383 Other fatigue: Secondary | ICD-10-CM

## 2017-05-24 DIAGNOSIS — E559 Vitamin D deficiency, unspecified: Secondary | ICD-10-CM

## 2017-05-24 LAB — COMPLETE METABOLIC PANEL WITH GFR
ALBUMIN: 3.7 g/dL (ref 3.6–5.1)
ALK PHOS: 63 U/L (ref 40–115)
ALT: 22 U/L (ref 9–46)
AST: 25 U/L (ref 10–35)
BUN: 26 mg/dL — ABNORMAL HIGH (ref 7–25)
CO2: 26 mmol/L (ref 20–31)
Calcium: 8.9 mg/dL (ref 8.6–10.3)
Chloride: 106 mmol/L (ref 98–110)
Creat: 0.91 mg/dL (ref 0.70–1.11)
GFR, EST NON AFRICAN AMERICAN: 77 mL/min (ref >=60)
GFR, Est African American: 89 mL/min (ref >=60)
GLUCOSE: 78 mg/dL (ref 65–99)
POTASSIUM: 4.1 mmol/L (ref 3.5–5.3)
SODIUM: 140 mmol/L (ref 135–146)
Total Bilirubin: 0.5 mg/dL (ref 0.2–1.2)
Total Protein: 6.2 g/dL (ref 6.1–8.1)

## 2017-05-24 LAB — CBC WITH DIFFERENTIAL/PLATELET
BASOS ABS: 79 {cells}/uL (ref 0–200)
Basophils Relative: 1 %
EOS PCT: 2 %
Eosinophils Absolute: 158 cells/uL (ref 15–500)
HCT: 42.2 % (ref 38.5–50.0)
HEMOGLOBIN: 14.1 g/dL (ref 13.2–17.1)
LYMPHS ABS: 1659 {cells}/uL (ref 850–3900)
LYMPHS PCT: 21 %
MCH: 33.2 pg — AB (ref 27.0–33.0)
MCHC: 33.4 g/dL (ref 32.0–36.0)
MCV: 99.3 fL (ref 80.0–100.0)
MONOS PCT: 10 %
MPV: 9.8 fL (ref 7.5–12.5)
Monocytes Absolute: 790 cells/uL (ref 200–950)
NEUTROS PCT: 66 %
Neutro Abs: 5214 cells/uL (ref 1500–7800)
Platelets: 262 10*3/uL (ref 140–400)
RBC: 4.25 MIL/uL (ref 4.20–5.80)
RDW: 14 % (ref 11.0–15.0)
WBC: 7.9 10*3/uL (ref 3.8–10.8)

## 2017-05-24 NOTE — Progress Notes (Signed)
Careteam: Patient Care Team: [place] Dooms, MD as PCP - General (Internal Medicine) Marilynne Halsted, MD as Referring Physician (Ophthalmology) Mal Misty, MD as Consulting Physician (Vascular Surgery) Irene Shipper, MD as Consulting Physician (Gastroenterology)  Advanced Directive information Does Patient Have a Medical Advance Directive?: Yes, Type of Advance Directive: [place];Living will  Allergies  Allergen Reactions  . Amoxicillin   . Codeine   . Doxycycline     Chief Complaint  Patient presents with  . Acute Visit    Pt is being seen due to severe fatigue since having cold virus several weeks ago.      HPI: Patient is a 83 y.o. male seen in the office today due to fatigue. Had a virus several weeks ago and still having low energy levels. Feels like he is over the cold. Very active with exercise. Still able to exercise even with fatigue. Does 30 mins on elliptical and weights that he was doing 30 years ago. Has not lost strength.  Gets up and works every day. No naps during the day.  No changes in diet.  No problems with sleep at night. Getting up a few times at night to use the bathroom but this is not new.  No snoring while he sleeps.  Started in May, had blood drawn and it was normal.  Feels like he has been very lucky in his 59 years. No real problems.  No anxiety or depression.  No increase in stress.  Hx of PMR- Dr Nyoka Cowden recommended increase prednisone but he did not do this because it took so long to get to the current dose. Sed rate was normal No weight loss- normal appetite.    Review of Systems:  Review of Systems  Constitutional: Negative for chills, fever and weight loss.       Increase in fatigue   Eyes: Negative for blurred vision.  Respiratory: Negative for cough, sputum production and shortness of breath.   Cardiovascular: Negative for chest pain, palpitations and leg swelling.  Gastrointestinal: Negative for  abdominal pain, constipation, diarrhea, heartburn, nausea and vomiting.  Genitourinary: Negative for dysuria, frequency and urgency.  Musculoskeletal: Negative for back pain, falls, joint pain and myalgias.  Skin: Negative.  Negative for itching and rash.  Neurological: Negative for dizziness and headaches.  Psychiatric/Behavioral: Negative for depression and memory loss. The patient does not have insomnia.     Past Medical History:  Diagnosis Date  . AAA (abdominal aortic aneurysm) ([place])   . Abnormal EKG    Left atrial abnormality  . Allergic rhinitis   . Benign neoplasm of colon 04/14/10   3 small polyps on colonoscopy by Dr. Henrene Pastor  . Carpal tunnel syndrome   . Degenerative disc disease   . Disturbances metabolism of methionine, homocystine, and cystathionine (HCC)    Elevated homocysteine  . Elevated prostate specific antigen (PSA)   . Hearing loss   . Hyperlipidemia   . Impotence of organic origin    Penile implant  . Internal hemorrhoids   . Migraine with aura, without mention of intractable migraine without mention of status migrainosus   . Neck pain   . Otosclerosis of both ears   . Peripheral vascular disease (HCC)    Bilateral femoral bruit  . Plantar fasciitis   . Polymyalgia rheumatica ([place])   . Rotator cuff syndrome of left shoulder   . Scoliosis   . Shoulder pain    Past Surgical History:  Procedure Laterality  Date  . Actinic keratosis removal  06/21/2010   Left shoulder; Dr. Sarajane Jews  . COLONOSCOPY W/ POLYPECTOMY    . DUPUYTREN CONTRACTURE RELEASE Right 12/05/2013   Procedure: DUPUYTREN CONTRACTURE RELEASE RIGHT LONG, RING AND SMALL FINGERS;  Surgeon: Cammie Sickle., MD;  Location: [facility];  Service: Orthopedics;  Laterality: Right;  . Implant penile pump  2001  . Resection of appendix and tip of rectum  February 2005  . STAPEDECTOMY Bilateral 1985, [place]   Social History:   reports that he has never smoked. He has  never used smokeless tobacco. He reports that he does not drink alcohol or use drugs.  Family History  Problem Relation Age of Onset  . Heart disease Mother   . Emphysema Mother     Medications: Patient's Medications  New Prescriptions   No medications on file  Previous Medications   ACYCLOVIR (ZOVIRAX) 400 MG TABLET    TAKE ONE TABLET TWICE DAILY DUE TO HERPES INFECTION   ASPIRIN 81 MG TABLET    Take 81 mg by mouth daily.   BUTALBITAL-ACETAMINOPHEN-CAFFEINE (FIORICET, ESGIC) 50-325-40 MG TABLET    TAKE 1 OR 2 TABLETS UPTO 4 TIMES DAILY   FISH OIL-OMEGA-3 FATTY ACIDS 1000 MG CAPSULE    Take 1 g by mouth daily.    L-METHYLFOLATE-B12-B6-B2 (METAFOLBIC) 04-28-49-5 MG TABS    TAKE ONE TABLET TWICE DAILY   LIDOCAINE (ASPERCREME W/LIDOCAINE) 4 % CREAM    Apply to painful joints up to 4 times daily   MULTIPLE VITAMINS-MINERALS (MULTIVITAMIN PO)    Take by mouth.   PREDNISONE (DELTASONE) 5 MG TABLET    TAKE 1/2 TO ONE TABLET DAILY TO PREVENT MYALGIAS   ROSUVASTATIN (CRESTOR) 5 MG TABLET    TAKE ONE TABLET EACH DAY TO REDUCE CHOLESTEROL   VITAMIN D, CHOLECALCIFEROL, PO    Take by mouth.  Modified Medications   No medications on file  Discontinued Medications   No medications on file     Physical Exam:  Vitals:   05/24/17 0837  BP: 112/78  Pulse: 70  Resp: 17  Temp: 97.9 F (36.6 C)  TempSrc: Oral  SpO2: 95%  Weight: 181 lb 9.6 oz (82.4 kg)  Height: 6' (1.829 m)   Body mass index is 24.63 kg/m.  Physical Exam  Constitutional: He is oriented to person, place, and time. He appears well-developed and well-nourished. No distress.  HENT:  Head: Normocephalic.  Right Ear: External ear normal.  Left Ear: External ear normal.  Nose: Nose normal.  Mouth/Throat: Oropharynx is clear and moist. No oropharyngeal exudate.  Right ear normal. Left EAC has wax occlusion.  Eyes: Conjunctivae and EOM are normal. Pupils are equal, round, and reactive to light.  Contact lenses.  Neck: Neck  supple. No JVD present. No tracheal deviation present. No thyromegaly present.  Cardiovascular: Normal rate, regular rhythm and normal heart sounds.  Exam reveals no gallop and no friction rub.   No murmur heard. 1/4 bilateral femoral bruit.  2/4 abdominal aortic bruit. Increase pulsation at the abdominal aorta.  Pulmonary/Chest: Effort normal and breath sounds normal. No respiratory distress. He has no wheezes. He has no rales. He exhibits no tenderness.  Abdominal: He exhibits no distension and no mass. There is no tenderness.  Musculoskeletal: Normal range of motion. He exhibits no edema or tenderness.  Lymphadenopathy:    He has no cervical adenopathy.  Neurological: He is alert and oriented to person, place, and time. He  has normal reflexes. No cranial nerve deficit. Coordination normal.  09/28/16 MMSE 29/30. Failed clock drawing.  Skin: No rash noted. He is not diaphoretic. No erythema. No pallor.  Psychiatric: He has a normal mood and affect. His behavior is normal. Judgment and thought content normal.    Labs reviewed: Basic Metabolic Panel:  Recent Labs  09/26/16 0804 04/04/17 0819  NA 141 141  K 4.3 4.5  CL 106 108  CO2 28 26  GLUCOSE 97 98  BUN 27* 26*  CREATININE 1.17* 1.13*  CALCIUM 9.2 8.7  TSH  --  1.81   Liver Function Tests:  Recent Labs  09/26/16 0804 04/04/17 0819  AST 25 30  ALT 20 22  ALKPHOS 52 54  BILITOT 0.7 0.6  PROT 6.4 6.1  ALBUMIN 3.9 3.6   No results for input(s): LIPASE, AMYLASE in the last 8760 hours. No results for input(s): AMMONIA in the last 8760 hours. CBC:  Recent Labs  04/04/17 0819  WBC 7.6  NEUTROABS 5,092  HGB 14.4  HCT 42.9  MCV 100.5*  PLT 243   Lipid Panel:  Recent Labs  09/26/16 0804  CHOL 148  HDL 52  LDLCALC 82  TRIG 71  CHOLHDL 2.8   TSH:  Recent Labs  04/04/17 0819  TSH 1.81   A1C: No results found for: HGBA1C   Assessment/Plan 1. Fatigue, unspecified type -increased fatigue after viral  illness, which this may be post-viral fatigue however has been ongoing for 2 months. Still able to work, exercise and not requiring naps throughout the day. No signs of recurrent infection, depression, OSA, worsening of PMR. TSH normal on labs in May.  [place] Patient Virus DNA, Quant RTPCR - Testosterone - CBC with Differential/Platelets - CMP with eGFR - EKG 12-Lead- NSR- rate 67  2. Vitamin D deficiency -taking supplement, will follow up level.  - Vitamin D, 25-hydroxy   Ernestine Rohman K. Harle Battiest  [facility] [phone] 8 am - 5 pm) [phone] (after hours)

## 2017-05-25 LAB — VITAMIN D 25 HYDROXY (VIT D DEFICIENCY, FRACTURES): VIT D 25 HYDROXY: 34 ng/mL (ref 30–100)

## 2017-05-25 LAB — TESTOSTERONE: Testosterone: 418 ng/dL (ref 250–827)

## 2017-05-29 ENCOUNTER — Telehealth: Payer: Self-pay

## 2017-05-29 NOTE — Telephone Encounter (Signed)
Waiting on them to finalize. Everything so far is normal, awaiting 1 test

## 2017-05-29 NOTE — Telephone Encounter (Signed)
Patient called requesting lab results form last week. Please advise and send to Clinical Pool

## 2017-05-29 NOTE — Telephone Encounter (Signed)
Patient aware of response  

## 2017-05-30 NOTE — Addendum Note (Signed)
Addended by: Royann Shivers A on: 05/30/2017 04:02 PM   Modules accepted: Orders

## 2017-05-31 LAB — EPSTEIN BARR VIRUS DNA, QUANT RTPCR: EBV DNA, QN PCR: <200 copies/mL

## 2017-06-03 ENCOUNTER — Other Ambulatory Visit: Payer: Self-pay | Admitting: Internal Medicine

## 2017-06-06 DIAGNOSIS — D485 Neoplasm of uncertain behavior of skin: Secondary | ICD-10-CM | POA: Diagnosis not present

## 2017-06-06 DIAGNOSIS — Z85828 Personal history of other malignant neoplasm of skin: Secondary | ICD-10-CM | POA: Diagnosis not present

## 2017-06-06 DIAGNOSIS — C4441 Basal cell carcinoma of skin of scalp and neck: Secondary | ICD-10-CM | POA: Diagnosis not present

## 2017-06-07 DIAGNOSIS — H2513 Age-related nuclear cataract, bilateral: Secondary | ICD-10-CM | POA: Diagnosis not present

## 2017-06-07 DIAGNOSIS — H1032 Unspecified acute conjunctivitis, left eye: Secondary | ICD-10-CM | POA: Diagnosis not present

## 2017-06-07 DIAGNOSIS — H179 Unspecified corneal scar and opacity: Secondary | ICD-10-CM | POA: Diagnosis not present

## 2017-06-12 ENCOUNTER — Other Ambulatory Visit: Payer: Self-pay | Admitting: Internal Medicine

## 2017-06-22 ENCOUNTER — Emergency Department (HOSPITAL_COMMUNITY): Payer: Medicare Other

## 2017-06-22 ENCOUNTER — Emergency Department (HOSPITAL_COMMUNITY)
Admission: EM | Admit: 2017-06-22 | Discharge: 2017-06-22 | Disposition: A | Discharge status: Home / Self Care | Payer: Medicare Other | Attending: Emergency Medicine | Admitting: Emergency Medicine

## 2017-06-22 ENCOUNTER — Encounter (HOSPITAL_COMMUNITY): Payer: Self-pay

## 2017-06-22 ENCOUNTER — Telehealth: Payer: Self-pay | Admitting: *Deleted

## 2017-06-22 DIAGNOSIS — L0889 Other specified local infections of the skin and subcutaneous tissue: Secondary | ICD-10-CM | POA: Diagnosis not present

## 2017-06-22 DIAGNOSIS — R531 Weakness: Secondary | ICD-10-CM | POA: Insufficient documentation

## 2017-06-22 DIAGNOSIS — R509 Fever, unspecified: Secondary | ICD-10-CM

## 2017-06-22 DIAGNOSIS — Z79899 Other long term (current) drug therapy: Secondary | ICD-10-CM | POA: Insufficient documentation

## 2017-06-22 DIAGNOSIS — Z7982 Long term (current) use of aspirin: Secondary | ICD-10-CM | POA: Insufficient documentation

## 2017-06-22 DIAGNOSIS — M353 Polymyalgia rheumatica: Secondary | ICD-10-CM | POA: Diagnosis present

## 2017-06-22 DIAGNOSIS — I714 Abdominal aortic aneurysm, without rupture: Secondary | ICD-10-CM | POA: Diagnosis not present

## 2017-06-22 DIAGNOSIS — W57XXXA Bitten or stung by nonvenomous insect and other nonvenomous arthropods, initial encounter: Secondary | ICD-10-CM | POA: Diagnosis not present

## 2017-06-22 DIAGNOSIS — R5383 Other fatigue: Secondary | ICD-10-CM | POA: Diagnosis not present

## 2017-06-22 DIAGNOSIS — Z85828 Personal history of other malignant neoplasm of skin: Secondary | ICD-10-CM | POA: Diagnosis not present

## 2017-06-22 DIAGNOSIS — I739 Peripheral vascular disease, unspecified: Secondary | ICD-10-CM | POA: Insufficient documentation

## 2017-06-22 DIAGNOSIS — E785 Hyperlipidemia, unspecified: Secondary | ICD-10-CM | POA: Diagnosis present

## 2017-06-22 LAB — URINALYSIS, ROUTINE W REFLEX MICROSCOPIC
BILIRUBIN URINE: NEGATIVE
Glucose, UA: NEGATIVE mg/dL
Hgb urine dipstick: NEGATIVE
Ketones, ur: NEGATIVE mg/dL
Leukocytes, UA: NEGATIVE
NITRITE: NEGATIVE
Protein, ur: NEGATIVE mg/dL
SPECIFIC GRAVITY, URINE: 1.019 (ref 1.005–1.030)
pH: 5 (ref 5.0–8.0)

## 2017-06-22 LAB — CBC WITH DIFFERENTIAL/PLATELET
BASOS PCT: 0 %
Basophils Absolute: 0 10*3/uL (ref 0.0–0.1)
EOS ABS: 0 10*3/uL (ref 0.0–0.7)
Eosinophils Relative: 0 %
HCT: 42.2 % (ref 39.0–52.0)
HEMOGLOBIN: 14.6 g/dL (ref 13.0–17.0)
Lymphocytes Relative: 16 %
Lymphs Abs: 1.2 10*3/uL (ref 0.7–4.0)
MCH: 33.3 pg (ref 26.0–34.0)
MCHC: 34.6 g/dL (ref 30.0–36.0)
MCV: 96.1 fL (ref 78.0–100.0)
Monocytes Absolute: 0.9 10*3/uL (ref 0.1–1.0)
Monocytes Relative: 12 %
NEUTROS PCT: 72 %
Neutro Abs: 5.6 10*3/uL (ref 1.7–7.7)
Platelets: 157 10*3/uL (ref 150–400)
RBC: 4.39 MIL/uL (ref 4.22–5.81)
RDW: 13.5 % (ref 11.5–15.5)
WBC: 7.7 10*3/uL (ref 4.0–10.5)

## 2017-06-22 LAB — CORTISOL: CORTISOL PLASMA: 10.1 ug/dL

## 2017-06-22 LAB — COMPREHENSIVE METABOLIC PANEL
ALK PHOS: 63 U/L (ref 38–126)
ALT: 42 U/L (ref 17–63)
AST: 49 U/L — ABNORMAL HIGH (ref 15–41)
Albumin: 3.4 g/dL — ABNORMAL LOW (ref 3.5–5.0)
Anion gap: 7 (ref 5–15)
BUN: 19 mg/dL (ref 6–20)
CALCIUM: 8.5 mg/dL — AB (ref 8.9–10.3)
CO2: 25 mmol/L (ref 22–32)
CREATININE: 1.21 mg/dL (ref 0.61–1.24)
Chloride: 100 mmol/L — ABNORMAL LOW (ref 101–111)
GFR, EST NON AFRICAN AMERICAN: 53 mL/min — AB (ref >60)
Glucose, Bld: 127 mg/dL — ABNORMAL HIGH (ref 65–99)
Potassium: 4.2 mmol/L (ref 3.5–5.1)
Sodium: 132 mmol/L — ABNORMAL LOW (ref 135–145)
Total Bilirubin: 0.5 mg/dL (ref 0.3–1.2)
Total Protein: 6.3 g/dL — ABNORMAL LOW (ref 6.5–8.1)

## 2017-06-22 LAB — I-STAT CG4 LACTIC ACID, ED: LACTIC ACID, VENOUS: 1.13 mmol/L (ref 0.5–1.9)

## 2017-06-22 LAB — TSH: TSH: 0.888 u[IU]/mL (ref 0.350–4.500)

## 2017-06-22 LAB — TROPONIN I: Troponin I: <0.03 ng/mL (ref <0.03)

## 2017-06-22 LAB — CK: Total CK: 153 U/L (ref 49–397)

## 2017-06-22 MED ORDER — MORPHINE SULFATE (PF) 4 MG/ML IV SOLN
4.0000 mg | INTRAVENOUS | Status: DC | PRN
  Administered 2017-06-22: 4 mg via INTRAVENOUS
  Filled 2017-06-22: qty 1

## 2017-06-22 MED ORDER — ONDANSETRON 4 MG PO TBDP: 4.0000 mg | ORAL_TABLET | Freq: Three times a day (TID) | ORAL | 0 refills | Status: DC | PRN

## 2017-06-22 MED ORDER — ACETAMINOPHEN 500 MG PO TABS
500.0000 mg | ORAL_TABLET | Freq: Once | ORAL | Status: AC
  Administered 2017-06-22: 500 mg via ORAL
  Filled 2017-06-22: qty 1

## 2017-06-22 MED ORDER — BUTALBITAL-APAP-CAFFEINE 50-325-40 MG PO TABS
1.0000 | ORAL_TABLET | Freq: Once | ORAL | Status: AC
  Administered 2017-06-22: 1 via ORAL
  Filled 2017-06-22: qty 1

## 2017-06-22 MED ORDER — ONDANSETRON HCL 4 MG/2ML IJ SOLN
4.0000 mg | Freq: Once | INTRAMUSCULAR | Status: AC
  Administered 2017-06-22: 4 mg via INTRAVENOUS
  Filled 2017-06-22: qty 2

## 2017-06-22 NOTE — Telephone Encounter (Signed)
Dr. Nyoka Cowden called office and spoke with Earlie Server and stated that patient called his phone and stated that he was having afternoon fevers and not feeling well. Patient stated that he needed a sooner appointment than 7/30. Dr. Nyoka Cowden wanted [place] to have patient to come in for a Urine and Culture to have being preformed until his appointment on 7/30.  Confirmed with Dr. Mariea Clonts and order was placed under [facility].  Patient notified and will come in the morning to leave specimen.

## 2017-06-22 NOTE — Discharge Instructions (Signed)
Some of your tests that were sent tonight will not be resulted for up to 7 days.  Make a follow up appointment with your current NP, or with [facility] as Discussed. Return here for any worsening. Call Dr. Jeneen Rinks on his cell phone with any questions.

## 2017-06-22 NOTE — ED Notes (Signed)
Pt vomited x1.  

## 2017-06-22 NOTE — ED Triage Notes (Signed)
Per Pt, Pt reports for the last six months having an increase in fatigue. IN the last week, pt noted a bite to the right hip. Pt saw the dermatologist today and reports that he believes it is a spider bite. Pt reports having three days of a fever that has increased over the three days. Denies cough, congestion, N/V/D

## 2017-06-22 NOTE — ED Provider Notes (Signed)
[facility] DEPT Provider Note   CSN: [id number] Arrival date & time: 06/22/17  1728     History   Chief Complaint Chief Complaint  Patient presents with  . Fever    HPI [name] is a 83 y.o. male. Chief complaint of weakness, and fever  HPI: [name] is an otherwise very healthy active 83 year old male. I know him well. He is my across the street neighbor here in [place]. He goes to the office everyday. Until recently he was golfing or going to the gym every afternoon. 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  He reports marked fatigue over the last several months. States he feels normal and fine in the morning. However is at progressive fatigue by afternoon. No muscle aches or pain. He is on a stable dose of a statin.  No chest pain, or shortness of breath that limits him. Literally relates fatigue and weakness.  He noticed a small amount of his skin on his right lateral hip 2 days ago. His become somewhat larger and red and has an eschar in the middle. He was seen by dermatology today and had a biopsy obtained. He is adamant that he has not had extremity bites and has really not been outside in the last month due to his fatigue  For the last 3 days he has felt cold. He felt chilled on day 1 without frank rigors. He presents here with temperature 102.7.  No additional symptoms. No headache or neck pain. No sinus pain or sore throat. No chest pain or cough. No abdominal pain or GI complaints  No GU symptoms. No urinary hesitancy or frequency. No joint pain. No other areas of scattered about his other than the area on his right hip.  Pt reports that today he was so fatigued that he couldnt even walk across the house.    Past Medical History:  Diagnosis Date  . AAA (abdominal aortic aneurysm) ([place])   . Abnormal EKG    Left atrial abnormality  . Allergic rhinitis   . Benign neoplasm of colon 04/14/10     3 small polyps on colonoscopy by Dr. Henrene Pastor  . Carpal tunnel syndrome   . Degenerative disc disease   . Disturbances metabolism of methionine, homocystine, and cystathionine (HCC)    Elevated homocysteine  . Elevated prostate specific antigen (PSA)   . Hearing loss   . Hyperlipidemia   . Impotence of organic origin    Penile implant  . Internal hemorrhoids   . Migraine with aura, without mention of intractable migraine without mention of status migrainosus   . Neck pain   . Otosclerosis of both ears   . Peripheral vascular disease (HCC)    Bilateral femoral bruit  . Plantar fasciitis   . Polymyalgia rheumatica ([place])   . Rotator cuff syndrome of left shoulder   . Scoliosis   . Shoulder pain     Patient Active Problem List   Diagnosis Date Noted  . Fatigue 04/03/2017  . Abdominal pain, RUQ 03/28/2017  . Sinusitis 03/14/2017  . Skin tear of hand without complication, initial encounter 01/11/2017  . Abrasion, multiple sites 01/11/2017  . Mass of left hand 10/19/2016  . 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 AAA (abdominal aortic aneurysm) without rupture ([place]) 01/26/2016  . Dupuytren's contracture of left hand 08/26/2014  . Contracture of joint of finger of left hand 08/26/2014  . Branch retinal vein occlusion 05/27/2014  .  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 Special screening for malignant neoplasm of prostate 02/18/2014  . Urinary frequency 02/18/2014  . Incomplete bladder emptying 02/18/2014  . AAA (abdominal aortic aneurysm) ([place]) 01/07/2014  . Hyperlipidemia 08/20/2013  . Polymyalgia rheumatica ([place]) 08/20/2013  . Unspecified vitamin D deficiency 08/20/2013  . Impotence of organic origin 08/20/2013  . Floater, vitreous 10/19/2012  . Cerebrovascular disease 09/28/2012  . Cataract, nuclear 03/14/2012  . Corneal macula not interfering with central vision 03/14/2012  . COLONIC POLYPS, HX OF 03/16/2010    Past Surgical History:  Procedure Laterality Date  .  Actinic keratosis removal  06/21/2010   Left shoulder; Dr. Sarajane Jews  . COLONOSCOPY W/ POLYPECTOMY    . DUPUYTREN CONTRACTURE RELEASE Right 12/05/2013   Procedure: DUPUYTREN CONTRACTURE RELEASE RIGHT LONG, RING AND SMALL FINGERS;  Surgeon: Cammie Sickle., MD;  Location: [facility];  Service: Orthopedics;  Laterality: Right;  . Implant penile pump  2001  . Resection of appendix and tip of rectum  February 2005  . STAPEDECTOMY Bilateral 1985, [place] Medications    Prior to Admission medications   Medication Sig Start Date End Date Taking? Authorizing Provider  acyclovir (ZOVIRAX) 400 MG tablet TAKE ONE TABLET TWICE DAILY DUE TO HERPES INFECTION 01/24/17   Estill Dooms, MD  aspirin 81 MG tablet Take 81 mg by mouth daily.    [provider]  butalbital-acetaminophen-caffeine (FIORICET, ESGIC) [phone] MG tablet TAKE 1 OR 2 TABLETS UPTO 4 TIMES DAILY 06/12/17   [name], NP  fish oil-omega-3 fatty acids 1000 MG capsule Take 1 g by mouth daily.     [provider]  L-Methylfolate-B12-B6-B2 (METAFOLBIC) 04-28-49-5 MG TABS TAKE ONE TABLET TWICE DAILY 01/12/17   Estill Dooms, MD  lidocaine (ASPERCREME W/LIDOCAINE) 4 % cream Apply to painful joints up to 4 times daily 03/28/17   Estill Dooms, MD  Multiple Vitamins-Minerals (MULTIVITAMIN PO) Take by mouth.    [provider]  predniSONE (DELTASONE) 5 MG tablet TAKE 1/2 TO ONE TABLET DAILY TO PREVENT MYALGIAS 02/02/17   Estill Dooms, MD  rosuvastatin (CRESTOR) 5 MG tablet TAKE ONE TABLET EACH DAY TO REDUCE CHOLESTEROL 06/05/17   Blanchie Serve, MD  VITAMIN D, CHOLECALCIFEROL, PO Take by mouth.    [provider]    Family History Family History  Problem Relation Age of Onset  . Heart disease Mother   . Emphysema Mother     Social History Social History  Substance Use Topics  . Smoking status: Never Smoker  . Smokeless tobacco: Never Used  .  Alcohol use No     Allergies   Amoxicillin; Codeine; and Doxycycline   Review of Systems Review of Systems  Constitutional: Positive for activity change, chills, fatigue and fever. Negative for appetite change and diaphoresis.  HENT: Negative for mouth sores, sore throat and trouble swallowing.   Eyes: Negative for visual disturbance.  Respiratory: Negative for cough, chest tightness, shortness of breath and wheezing.   Cardiovascular: Negative for chest pain.  Gastrointestinal: Negative for abdominal distention, abdominal pain, diarrhea, nausea and vomiting.  Endocrine: Negative for polydipsia, polyphagia and polyuria.  Genitourinary: Negative for dysuria, frequency and hematuria.  Musculoskeletal: Negative for gait problem.  Skin: Positive for color change, rash and wound. Negative for pallor.  Neurological: Positive for weakness. Negative for dizziness, syncope, light-headedness and headaches.  Hematological: Does not bruise/bleed easily.  Psychiatric/Behavioral: Negative for behavioral  problems and confusion.     Physical Exam Updated Vital Signs BP 122/73   Pulse 77   Temp 98.7 F (37.1 C) (Oral)   Resp 16   Ht 6' (1.829 m)   Wt 79.4 kg (175 lb)   SpO2 96%   BMI 23.73 kg/m   Physical Exam  Constitutional: He is oriented to person, place, and time. He appears well-developed and well-nourished. No distress.  83 year old male. Awake and alert. Flushed cheeks. Oriented and lucid.  HENT:  Head: Normocephalic.  Eyes: Pupils are equal, round, and reactive to light. Conjunctivae are normal. No scleral icterus.  Conjunctiva normal.   Neck: Normal range of motion. Neck supple. No thyromegaly present.  Su(pple. No meningismus or LAD.  Cardiovascular: Normal rate and regular rhythm.  Exam reveals no gallop and no friction rub.   No murmur heard. Pulmonary/Chest: Effort normal and breath sounds normal. No respiratory distress. He has no wheezes. He has no rales.  Clear  bilateral breath sounds.  Abdominal: Soft. Bowel sounds are normal. He exhibits no distension. There is no tenderness. There is no rebound.  Musculoskeletal: Normal range of motion.  Neurological: He is alert and oriented to person, place, and time.  .No asymmetric or focal weakness.   Skin: Skin is warm and dry. No rash noted.  2cm area of round erythema to right hip. Central eschar. No additional rash.  Psychiatric: He has a normal mood and affect. His behavior is normal.     ED Treatments / Results  Labs (all labs ordered are listed, but only abnormal results are displayed) Labs Reviewed  COMPREHENSIVE METABOLIC PANEL - Abnormal; Notable for the following:       Result Value   Sodium 132 (*)    Chloride 100 (*)    Glucose, Bld 127 (*)    Calcium 8.5 (*)    Total Protein 6.3 (*)    Albumin 3.4 (*)    AST 49 (*)    GFR calc non Af Amer 53 (*)    All other components within normal limits  URINALYSIS, ROUTINE W REFLEX MICROSCOPIC - Abnormal; Notable for the following:    Color, Urine AMBER (*)    APPearance HAZY (*)    All other components within normal limits  CULTURE, BLOOD (ROUTINE X 2)  CULTURE, BLOOD (ROUTINE X 2)  URINE CULTURE  CBC WITH DIFFERENTIAL/PLATELET  TSH  CORTISOL  CK  TROPONIN I  ROCKY MTN SPOTTED FVR ABS PNL(IGG+IGM)  I-STAT CG4 LACTIC ACID, ED    EKG  EKG Interpretation  Date/Time:  Thursday June 22 2017 19:27:12 EDT Ventricular Rate:  76 PR Interval:    QRS Duration: 97 QT Interval:  359 QTC Calculation: 404 R Axis:   1 Text Interpretation:  Sinus rhythm Low voltage, precordial leads Confirmed by Tanna Furry [phone]) on 06/22/2017 7:31:13 PM       Radiology No results found.  Procedures Procedures (including critical care time)  Medications Ordered in ED Medications  butalbital-acetaminophen-caffeine (FIORICET, ESGIC) 50-325-40 MG per tablet 1 tablet (1 tablet Oral Given 06/22/17 1918)  acetaminophen (TYLENOL) tablet 500 mg (500 mg  Oral Given 06/22/17 1918)     Initial Impression / Assessment and Plan / ED Course  I have reviewed the triage vital signs and the nursing notes.  Pertinent labs & imaging results that were available during my care of the patient were reviewed by me and considered in my medical decision making (see chart for details).   fever without obvious  source and fatigue and 83 year old male. He is on 5 mg prednisone daily for polymyalgia. May need stress dosing. UA does not appear infected. No leukocytosis. Normal lactate. He does not have abnormal vital signs and is not hypoxemic.  Chest x-ray pending. EKG without changes normal troponin TSH normal. Normal cortisol.  If insect related he does not show signs of meningitis, or encephalitis. RMSF titers sent. No rash to suggest spotted fever.  I am concerned about his level of fatigue and weakness.  No apparent etiology at this time. Concern for Myasthenia with clear daily fatigue after normal am strength. No dysphagia, etc.   I will discuss with hospitalist.  Final Clinical Impressions(s) / ED Diagnoses   Final diagnoses:  Fever, unspecified fever cause  Fatigue, unspecified type  Weakness   Patient seen and evaluated by Dr.Niu, hospitalist. We have discussed the patient. We have ordered Ach receptor antibodies, and aldolase to r/o myasthenia, and myositis.   New Prescriptions New Prescriptions   No medications on file     Tanna Furry, MD 07/05/17 [phone]

## 2017-06-22 NOTE — ED Notes (Signed)
Dr. James at bedside  

## 2017-06-23 ENCOUNTER — Other Ambulatory Visit: Payer: Medicare Other

## 2017-06-24 LAB — ROCKY MTN SPOTTED FVR ABS PNL(IGG+IGM)
RMSF IGG: NEGATIVE
RMSF IGM: 0.41 {index} (ref 0.00–0.89)

## 2017-06-24 LAB — URINE CULTURE: Culture: NO GROWTH

## 2017-06-26 ENCOUNTER — Ambulatory Visit: Payer: Medicare Other | Admitting: Nurse Practitioner

## 2017-06-27 DIAGNOSIS — R51 Headache: Secondary | ICD-10-CM | POA: Diagnosis not present

## 2017-06-27 DIAGNOSIS — I714 Abdominal aortic aneurysm, without rupture: Secondary | ICD-10-CM | POA: Diagnosis not present

## 2017-06-27 DIAGNOSIS — J8410 Pulmonary fibrosis, unspecified: Secondary | ICD-10-CM | POA: Diagnosis not present

## 2017-06-27 DIAGNOSIS — E784 Other hyperlipidemia: Secondary | ICD-10-CM | POA: Diagnosis not present

## 2017-06-27 DIAGNOSIS — B0089 Other herpesviral infection: Secondary | ICD-10-CM | POA: Diagnosis not present

## 2017-06-27 DIAGNOSIS — R5383 Other fatigue: Secondary | ICD-10-CM | POA: Diagnosis not present

## 2017-06-27 DIAGNOSIS — Z0000 Encounter for general adult medical examination without abnormal findings: Secondary | ICD-10-CM | POA: Diagnosis not present

## 2017-06-27 DIAGNOSIS — Z6824 Body mass index (BMI) 24.0-24.9, adult: Secondary | ICD-10-CM | POA: Diagnosis not present

## 2017-06-27 DIAGNOSIS — Z1389 Encounter for screening for other disorder: Secondary | ICD-10-CM | POA: Diagnosis not present

## 2017-06-27 LAB — CULTURE, BLOOD (ROUTINE X 2)
CULTURE: NO GROWTH
Culture: NO GROWTH
SPECIAL REQUESTS: ADEQUATE
SPECIAL REQUESTS: ADEQUATE

## 2017-07-04 DIAGNOSIS — R351 Nocturia: Secondary | ICD-10-CM | POA: Diagnosis not present

## 2017-07-11 ENCOUNTER — Other Ambulatory Visit: Payer: Self-pay | Admitting: *Deleted

## 2017-07-11 DIAGNOSIS — R5383 Other fatigue: Secondary | ICD-10-CM

## 2017-07-11 DIAGNOSIS — E785 Hyperlipidemia, unspecified: Secondary | ICD-10-CM

## 2017-07-12 ENCOUNTER — Encounter: Payer: Self-pay | Admitting: Surgery

## 2017-07-12 ENCOUNTER — Other Ambulatory Visit: Payer: Self-pay | Admitting: Internal Medicine

## 2017-07-17 ENCOUNTER — Ambulatory Visit: Payer: Medicare Other | Admitting: Surgery

## 2017-07-20 DIAGNOSIS — R351 Nocturia: Secondary | ICD-10-CM | POA: Diagnosis not present

## 2017-07-21 ENCOUNTER — Other Ambulatory Visit: Payer: Self-pay | Admitting: Internal Medicine

## 2017-07-24 ENCOUNTER — Ambulatory Visit (INDEPENDENT_AMBULATORY_CARE_PROVIDER_SITE_OTHER)
Admission: RE | Admit: 2017-07-24 | Discharge: 2017-07-24 | Disposition: A | Discharge status: Home / Self Care | Payer: Medicare Other | Source: Ambulatory Visit | Attending: Surgery | Admitting: Surgery

## 2017-07-24 ENCOUNTER — Ambulatory Visit (HOSPITAL_COMMUNITY)
Admission: RE | Admit: 2017-07-24 | Discharge: 2017-07-24 | Disposition: A | Discharge status: Home / Self Care | Payer: Medicare Other | Source: Ambulatory Visit | Attending: Surgery | Admitting: Surgery

## 2017-07-24 ENCOUNTER — Ambulatory Visit (INDEPENDENT_AMBULATORY_CARE_PROVIDER_SITE_OTHER): Payer: Medicare Other | Admitting: Surgery

## 2017-07-24 VITALS — BP 123/66 | HR 58 | Ht 72.0 in | Wt 181.7 lb

## 2017-07-24 DIAGNOSIS — I739 Peripheral vascular disease, unspecified: Secondary | ICD-10-CM

## 2017-07-24 DIAGNOSIS — I679 Cerebrovascular disease, unspecified: Secondary | ICD-10-CM | POA: Diagnosis not present

## 2017-07-24 DIAGNOSIS — I714 Abdominal aortic aneurysm, without rupture, unspecified: Secondary | ICD-10-CM

## 2017-07-24 LAB — VAS US CAROTID
LCCADDIAS: -20 cm/s
LCCADSYS: -64 cm/s
LCCAPDIAS: 20 cm/s
LCCAPSYS: 61 cm/s
LEFT ECA DIAS: -12 cm/s
LEFT VERTEBRAL DIAS: 14 cm/s
LICADDIAS: -21 cm/s
LICADSYS: -57 cm/s
LICAPDIAS: -12 cm/s
LICAPSYS: -48 cm/s
RCCAPSYS: 52 cm/s
RIGHT CCA MID DIAS: 11 cm/s
RIGHT ECA DIAS: -15 cm/s
RIGHT VERTEBRAL DIAS: 11 cm/s
Right CCA prox dias: 12 cm/s
Right cca dist sys: -33 cm/s

## 2017-07-24 NOTE — Progress Notes (Signed)
[facility] [place]  Patient name: [name] MRN: [id number] DOB: 07/26/1930 Sex: male   REASON FOR VISIT:    F/u AAA  HISOTRY OF PRESENT ILLNESS:    [name] is a 83 y.o. male who returns today for further discussions regarding his 5.2 cm infrarenal abdominal aortic aneurysm which we have been following for approximately 10 years ago.  There has been significant interval growth over the past 6 months.  Previously it measured 4.7 cm.  He is without back pain or abdominal pain.  The patient was recently scheduled to go to [place] for golf, however he developed a spider bite and unfortunately the trip was canceled.  He is here today for ongoing discussions regarding repair of his aneurysm.  He continues to take a statin for hypercholesterolemia.  He remains very active.   PAST MEDICAL HISTORY:   Past Medical History:  Diagnosis Date  . AAA (abdominal aortic aneurysm) ([place])   . Abnormal EKG    Left atrial abnormality  . Allergic rhinitis   . Benign neoplasm of colon 04/14/10   3 small polyps on colonoscopy by Dr. Henrene Pastor  . Carpal tunnel syndrome   . Degenerative disc disease   . Disturbances metabolism of methionine, homocystine, and cystathionine (HCC)    Elevated homocysteine  . Elevated prostate specific antigen (PSA)   . Hearing loss   . Hyperlipidemia   . Impotence of organic origin    Penile implant  . Internal hemorrhoids   . Migraine with aura, without mention of intractable migraine without mention of status migrainosus   . Neck pain   . Otosclerosis of both ears   . Peripheral vascular disease (HCC)    Bilateral femoral bruit  . Plantar fasciitis   . Polymyalgia rheumatica ([place])   . Rotator cuff syndrome of left shoulder   . Scoliosis   . Shoulder pain      FAMILY HISTORY:   Family History  Problem Relation Age of Onset  . Heart disease Mother   . Emphysema Mother     SOCIAL HISTORY:    Social History  Substance Use Topics  . Smoking status: Never Smoker  . Smokeless tobacco: Never Used  . Alcohol use No     ALLERGIES:   Allergies  Allergen Reactions  . Amoxicillin   . Codeine   . Doxycycline     Upset stomach, currently taking and tolerating well       CURRENT MEDICATIONS:   Current Outpatient Prescriptions  Medication Sig Dispense Refill  . acyclovir (ZOVIRAX) 400 MG tablet TAKE ONE TABLET TWICE DAILY DUE TO HERPES INFECTION 60 tablet 5  . aspirin 81 MG tablet Take 81 mg by mouth daily.    . butalbital-acetaminophen-caffeine (FIORICET, ESGIC) 50-325-40 MG tablet TAKE 1 OR 2 TABLETS UPTO 4 TIMES DAILY 30 tablet 0  . fish oil-omega-3 fatty acids 1000 MG capsule Take 1 g by mouth daily.     Marland Kitchen L-Methylfolate-B12-B6-B2 (METAFOLBIC) 04-28-49-5 MG TABS TAKE ONE TABLET TWICE DAILY 60 tablet 0  . Multiple Vitamins-Minerals (MULTIVITAMIN PO) Take by mouth.    . predniSONE (DELTASONE) 5 MG tablet TAKE 1/2 TO ONE TABLET DAILY TO PREVENT MYALGIAS 90 tablet 1  . rosuvastatin (CRESTOR) 5 MG tablet TAKE ONE TABLET EACH DAY TO REDUCE CHOLESTEROL 90 tablet 0  . VITAMIN D, CHOLECALCIFEROL, PO Take by mouth.    . lidocaine (ASPERCREME W/LIDOCAINE) 4 % cream Apply to painful joints up to 4 times daily (  Patient not taking: Reported on 07/24/2017) 30 g 0   No current facility-administered medications for this visit.     REVIEW OF SYSTEMS:   [X]  denotes positive finding, [ ]  denotes negative finding Cardiac  Comments:  Chest pain or chest pressure:    Shortness of breath upon exertion:    Short of breath when lying flat:    Irregular heart rhythm:        Vascular    Pain in calf, thigh, or hip brought on by ambulation:    Pain in feet at night that wakes you up from your sleep:     Blood clot in your veins:    Leg swelling:         Pulmonary    Oxygen at home:    Productive cough:     Wheezing:         Neurologic    Sudden weakness in arms or legs:     Sudden  numbness in arms or legs:     Sudden onset of difficulty speaking or slurred speech:    Temporary loss of vision in one eye:     Problems with dizziness:         Gastrointestinal    Blood in stool:     Vomited blood:         Genitourinary    Burning when urinating:     Blood in urine:        Psychiatric    Major depression:         Hematologic    Bleeding problems:    Problems with blood clotting too easily:        Skin    Rashes or ulcers:        Constitutional    Fever or chills:      PHYSICAL EXAM:   Vitals:   07/24/17 1542 07/24/17 1544  BP: 124/74 123/66  Pulse: (!) 58   SpO2: 97%   Weight: 181 lb 11.2 oz (82.4 kg)   Height: 6' (1.829 m)     GENERAL: The patient is a well-nourished male, in no acute distress. The vital signs are documented above. CARDIAC: There is a regular rate and rhythm.  VASCULAR: Palpable pedal pulses PULMONARY: Non-labored respirations ABDOMEN: Soft and non-tender MUSCULOSKELETAL: There are no major deformities or cyanosis. NEUROLOGIC: No focal weakness or paresthesias are detected. SKIN: There are no ulcers or rashes noted. PSYCHIATRIC: The patient has a normal affect.  STUDIES:   I have reviewed his ultrasound studies from today as follows  ABI on the right is 1.03, on the left 1.06.  Triphasic waveforms Carotid duplex: 1-39 percent stenosis Lower extremity duplex: No evidence of popliteal aneurysm  MEDICAL ISSUES:   AAA:  We discussed proceeding with endovascular repair of his 5.2 cm abdominal aortic aneurysm.  We discussed risks and benefits of the operation including the risk of injury to the femoral artery, renal disease, intestinal ischemia and other potential complications.  He would like to get this done while he remains very healthy and active.  I'm trying to accommodate his schedule and the best fit for this is Wednesday, September 19.  I am going to have him evaluated by cardiology prior to his operation   Annamarie Major, MD [facility] [phone] Pager [phone]

## 2017-07-25 ENCOUNTER — Telehealth: Payer: Self-pay

## 2017-07-25 NOTE — Telephone Encounter (Signed)
-----   Message from [name], MD sent at 07/25/2017  5:37 AM EDT ----- Ander Purpura or Joellen Jersey - could you add him to one of my office schedules in the next week or two? He needs preop clearance for AAA, referred by Dr [name].  thx Ronalee Belts

## 2017-07-25 NOTE — Telephone Encounter (Signed)
Scheduled patient 9/17 as he will be out of town for several days coming up. He agrees with treatment plan.

## 2017-08-01 ENCOUNTER — Other Ambulatory Visit: Payer: Self-pay

## 2017-08-07 ENCOUNTER — Other Ambulatory Visit: Payer: Self-pay | Admitting: Internal Medicine

## 2017-08-09 ENCOUNTER — Encounter (HOSPITAL_COMMUNITY)
Admission: RE | Admit: 2017-08-09 | Discharge: 2017-08-09 | Disposition: A | Discharge status: Home / Self Care | Payer: Medicare Other | Source: Ambulatory Visit | Attending: Surgery | Admitting: Surgery

## 2017-08-09 ENCOUNTER — Other Ambulatory Visit (HOSPITAL_COMMUNITY): Payer: Self-pay | Admitting: *Deleted

## 2017-08-09 ENCOUNTER — Encounter (HOSPITAL_COMMUNITY): Payer: Self-pay

## 2017-08-09 DIAGNOSIS — Z79899 Other long term (current) drug therapy: Secondary | ICD-10-CM | POA: Insufficient documentation

## 2017-08-09 DIAGNOSIS — I10 Essential (primary) hypertension: Secondary | ICD-10-CM | POA: Insufficient documentation

## 2017-08-09 DIAGNOSIS — I714 Abdominal aortic aneurysm, without rupture: Secondary | ICD-10-CM | POA: Insufficient documentation

## 2017-08-09 DIAGNOSIS — Z01812 Encounter for preprocedural laboratory examination: Secondary | ICD-10-CM | POA: Insufficient documentation

## 2017-08-09 LAB — CBC
HCT: 39.3 % (ref 39.0–52.0)
Hemoglobin: 13.3 g/dL (ref 13.0–17.0)
MCH: 33.5 pg (ref 26.0–34.0)
MCHC: 33.8 g/dL (ref 30.0–36.0)
MCV: 99 fL (ref 78.0–100.0)
Platelets: 211 10*3/uL (ref 150–400)
RBC: 3.97 MIL/uL — ABNORMAL LOW (ref 4.22–5.81)
RDW: 13.9 % (ref 11.5–15.5)
WBC: 8.8 10*3/uL (ref 4.0–10.5)

## 2017-08-09 LAB — URINALYSIS, ROUTINE W REFLEX MICROSCOPIC
Bilirubin Urine: NEGATIVE
Glucose, UA: NEGATIVE mg/dL
Hgb urine dipstick: NEGATIVE
Ketones, ur: NEGATIVE mg/dL
LEUKOCYTES UA: NEGATIVE
Nitrite: NEGATIVE
Protein, ur: NEGATIVE mg/dL
SPECIFIC GRAVITY, URINE: 1.016 (ref 1.005–1.030)
pH: 5 (ref 5.0–8.0)

## 2017-08-09 LAB — BLOOD GAS, ARTERIAL
ACID-BASE EXCESS: 1.9 mmol/L (ref 0.0–2.0)
BICARBONATE: 25.3 mmol/L (ref 20.0–28.0)
DRAWN BY: 470591
FIO2: 21
O2 SAT: 92.3 %
Patient temperature: 98.6
pCO2 arterial: 35 mmHg (ref 32.0–48.0)
pH, Arterial: 7.472 — ABNORMAL HIGH (ref 7.350–7.450)
pO2, Arterial: 60.7 mmHg — ABNORMAL LOW (ref 83.0–108.0)

## 2017-08-09 LAB — COMPREHENSIVE METABOLIC PANEL
ALK PHOS: 50 U/L (ref 38–126)
ALT: 26 U/L (ref 17–63)
AST: 43 U/L — AB (ref 15–41)
Albumin: 3.3 g/dL — ABNORMAL LOW (ref 3.5–5.0)
Anion gap: 8 (ref 5–15)
BILIRUBIN TOTAL: 0.8 mg/dL (ref 0.3–1.2)
BUN: 26 mg/dL — AB (ref 6–20)
CALCIUM: 8.5 mg/dL — AB (ref 8.9–10.3)
CO2: 23 mmol/L (ref 22–32)
Chloride: 105 mmol/L (ref 101–111)
Creatinine, Ser: 1.19 mg/dL (ref 0.61–1.24)
GFR calc Af Amer: >60 mL/min (ref >60)
GFR calc non Af Amer: 54 mL/min — ABNORMAL LOW (ref >60)
GLUCOSE: 125 mg/dL — AB (ref 65–99)
Potassium: 4.4 mmol/L (ref 3.5–5.1)
Sodium: 136 mmol/L (ref 135–145)
TOTAL PROTEIN: 6.3 g/dL — AB (ref 6.5–8.1)

## 2017-08-09 LAB — ABO/RH: ABO/RH(D): O POS

## 2017-08-09 LAB — SURGICAL PCR SCREEN
MRSA, PCR: NEGATIVE
Staphylococcus aureus: NEGATIVE

## 2017-08-09 LAB — PROTIME-INR
INR: 0.97
PROTHROMBIN TIME: 12.8 s (ref 11.4–15.2)

## 2017-08-09 LAB — APTT: APTT: 20 s — AB (ref 24–36)

## 2017-08-09 LAB — PREPARE RBC (CROSSMATCH)

## 2017-08-09 NOTE — Pre-Procedure Instructions (Signed)
[name]  08/09/2017    Your procedure is scheduled on Wednesday, August 16, 2017 at 10:30 AM.   Report to [facility] Entrance "A" Admitting Office at 8:30 AM.   Call this number if you have problems the morning of surgery: [phone]   Questions prior to day of surgery, please call [phone] between 8 & 4 PM.   Remember:  Do not eat food or drink liquids after midnight Tuesday, 08/15/17.  Take these medicines the morning of surgery with A SIP OF WATER: Acyclovir (Zovirax)  Stop Multivitamins and Fish Oil 5 days prior to surgery.   Do not wear jewelry.  Do not wear lotions, powders, cologne or deodorant.  Men may shave face and neck.  Do not bring valuables to the hospital.  [facility] is not responsible for any belongings or valuables.  Contacts, dentures or bridgework may not be worn into surgery.  Leave your suitcase in the car.  After surgery it may be brought to your room.  For patients admitted to the hospital, discharge time will be determined by your treatment team.  [facility] - Preparing for Surgery  Before surgery, you can play an important role.  Because skin is not sterile, your skin needs to be as free of germs as possible.  You can reduce the number of germs on you skin by washing with CHG (chlorahexidine gluconate) soap before surgery.  CHG is an antiseptic cleaner which kills germs and bonds with the skin to continue killing germs even after washing.  Please DO NOT use if you have an allergy to CHG or antibacterial soaps.  If your skin becomes reddened/irritated stop using the CHG and inform your nurse when you arrive at Short Stay.  Do not shave (including legs and underarms) for at least 48 hours prior to the first CHG shower.  You may shave your face.  Please follow these instructions carefully:   1.  Shower with CHG Soap the night before surgery and the                    morning of Surgery.  2.  If you choose to wash your hair,  wash your hair first as usual with your       normal shampoo.  3.  After you shampoo, rinse your hair and body thoroughly to remove the shampoo.  4.  Use CHG as you would any other liquid soap.  You can apply chg directly       to the skin and wash gently with scrungie or a clean washcloth.  5.  Apply the CHG Soap to your body ONLY FROM THE NECK DOWN.        Do not use on open wounds or open sores.  Avoid contact with your eyes, ears, mouth and genitals (private parts).  Wash genitals (private parts) with your normal soap.  6.  Wash thoroughly, paying special attention to the area where your surgery        will be performed.  7.  Thoroughly rinse your body with warm water from the neck down.  8.  DO NOT shower/wash with your normal soap after using and rinsing off       the CHG Soap.  9.  Pat yourself dry with a clean towel.            10.  Wear clean pajamas.            11.  Place clean sheets on your bed the night of your first shower and do not        sleep with pets.  Day of Surgery  Do not apply any lotions/deodorants the morning of surgery.  Please wear clean clothes to the hospital.   Please read over the fact sheets that you were given.

## 2017-08-09 NOTE — Pre-Procedure Instructions (Addendum)
[name]  08/09/2017    Your procedure is scheduled on Wednesday, August 16, 2017.   Report to [facility] Entrance "A" Admitting Office at 8:30 AM.   Call this number if you have problems the morning of surgery: [phone]   Questions prior to day of surgery, please call [phone] between 8 & 4 PM.   Remember:  Do not eat food or drink liquids after midnight Tuesday, 08/15/17.  Take these medicines the morning of surgery with A SIP OF WATER: Acyclovir (Zovira.  STOP all herbel meds, nsaids (aleve,naproxen,advil,ibuprofen) 7 days prior to surgery starting today (08/09/17) including all vitamins/supplements,fish oil,metafolbic  Stop aspirin per dr   Lazaro Arms not wear jewelry.  Do not wear lotions, powders, cologne or deodorant.  Men may shave face and neck.  Do not bring valuables to the hospital.  [facility] is not responsible for any belongings or valuables.  Contacts, dentures or bridgework may not be worn into surgery.  Leave your suitcase in the car.  After surgery it may be brought to your room.  For patients admitted to the hospital, discharge time will be determined by your treatment team.  [facility] - Preparing for Surgery  Before surgery, you can play an important role.  Because skin is not sterile, your skin needs to be as free of germs as possible.  You can reduce the number of germs on you skin by washing with CHG (chlorahexidine gluconate) soap before surgery.  CHG is an antiseptic cleaner which kills germs and bonds with the skin to continue killing germs even after washing.  Please DO NOT use if you have an allergy to CHG or antibacterial soaps.  If your skin becomes reddened/irritated stop using the CHG and inform your nurse when you arrive at Short Stay.  Do not shave (including legs and underarms) for at least 48 hours prior to the first CHG shower.  You may shave your face.  Please follow these instructions carefully:   1.  Shower with  CHG Soap the night before surgery and the                    morning of Surgery.  2.  If you choose to wash your hair, wash your hair first as usual with your       normal shampoo.  3.  After you shampoo, rinse your hair and body thoroughly to remove the shampoo.  4.  Use CHG as you would any other liquid soap.  You can apply chg directly       to the skin and wash gently with scrungie or a clean washcloth.  5.  Apply the CHG Soap to your body ONLY FROM THE NECK DOWN.        Do not use on open wounds or open sores.  Avoid contact with your eyes, ears, mouth and genitals (private parts).  Wash genitals (private parts) with your normal soap.  6.  Wash thoroughly, paying special attention to the area where your surgery        will be performed.  7.  Thoroughly rinse your body with warm water from the neck down.  8.  DO NOT shower/wash with your normal soap after using and rinsing off       the CHG Soap.  9.  Pat yourself dry with a clean towel.            10.  Wear clean pajamas.  11.  Place clean sheets on your bed the night of your first shower and do not        sleep with pets.  Day of Surgery  Do not apply any lotions/deodorants the morning of surgery.  Please wear clean clothes to the hospital.   Please read over the fact sheets that you were given.

## 2017-08-10 NOTE — Progress Notes (Addendum)
Anesthesia Chart Review:  Pt is an 83 year old male scheduled for abdominal aortic endovascular stent graft on 08/16/2017 with Harold Barban, MD  - PCP is Sherrie Mustache, NP - Pt saw [name], MD with cardiology for pre-op eval 08/14/17.  Note is not yet complete, but no testing was ordered and pt was instructed to f/u prn.   PMH includes:  AAA, hyperlipidemia, polymyalgia rheumatica.  Never smoker. BMI 24.5  Medications include: ASA 81mg , prednisone, rosuvastatin  BP 118/78   Pulse 62   Temp 36.8 C (Oral)   Ht 6' (1.829 m)   Wt 180 lb 4 oz (81.8 kg)   SpO2 100%   BMI 24.45 kg/m   Preoperative labs reviewed.   CXR 06/22/17: Chronic pulmonary fibrosis. No evidence of active pulmonary disease.  EKG 06/22/17: Sinus rhythm. Low voltage, precordial leads  CT angio chest 05/01/17:  1. Highly ectatic and mildly aneurysmal thoracic aorta. The tubular portion of the ascending thoracic aorta measures up to 4.4 cm in diameter while the proximal descending thoracic aorta measures up to 4.5 cm in diameter.  2. Coronary artery calcifications. 3.  Aortic Atherosclerosis. 4. Focal 9 mm ground-glass attenuation nodular opacity in the superior segment of the right lower lobe. Initial follow-up with CT at 6-12 months is recommended to confirm persistence. 5. Biapical and peripheral subpleural scarring.  CT angio abdomen/pelvis 05/01/17:  1. Fusiform aneurysmal dilatation of the infrarenal abdominal aorta with a maximal transverse diameter of 5.2 cm (see sagittally reformatted images). Aortic aneurysm NOS (ICD10-I71.9). 2. Ectatic bilateral common iliac arteries (right greater than left), left internal iliac artery, and right common femoral artery as described above. 3. Beaded appearance most suggestive of fibromuscular dysplasia involving both of the co-dominant left renal arteries as well as both external iliac arteries. 4. Renal and hepatic cysts. 5. Multilevel degenerative disc disease  throughout the lumbar spine. 6. Additionally ancillary findings as above.  If no changes, I anticipate pt can proceed with surgery as scheduled.   Willeen Cass, FNP-BC [facility]/Anesthesiology Phone: [phone] 08/15/2017 2:50 PM

## 2017-08-14 ENCOUNTER — Ambulatory Visit (INDEPENDENT_AMBULATORY_CARE_PROVIDER_SITE_OTHER): Payer: Medicare Other | Admitting: Cardiovascular Disease

## 2017-08-14 ENCOUNTER — Encounter: Payer: Self-pay | Admitting: Cardiovascular Disease

## 2017-08-14 VITALS — BP 118/80 | HR 80 | Ht 72.0 in | Wt 185.8 lb

## 2017-08-14 DIAGNOSIS — I714 Abdominal aortic aneurysm, without rupture, unspecified: Secondary | ICD-10-CM

## 2017-08-14 DIAGNOSIS — Z01810 Encounter for preprocedural cardiovascular examination: Secondary | ICD-10-CM

## 2017-08-14 NOTE — Progress Notes (Signed)
Cardiology Office Note Date:  08/16/2017   ID:  Randall SCHMELZLE, DOB 02-14-1930, MRN [id number]  PCP:  [name], NP  Cardiologist:  [name], MD    Chief Complaint  Patient presents with  . New Patient (Initial Visit)    surgical clearance for AAA      History of Present Illness: [name] is a 83 y.o. male who presents for pre-operative evaluation before [facility]AR to treat an enlarging abdominal aortic aneurysm. The patient is referred by Dr [name].   He works out for 40 minutes on the elliptical without exertional symptoms. He works out on machines and does bench press, leg lifts, curls, etc. He has no chest pain, chest pressure, shortness of breath, or any other exertional symptoms. He denies heart palpitations, lightheadedness, leg swelling, orthopnea, or PND.   The patient has had minor surgeries and has never had problems related to anesthesia. He remains active at age 59 and continues to work full time with the [employer].  Past Medical History:  Diagnosis Date  . AAA (abdominal aortic aneurysm) ([place])   . Abnormal EKG    Left atrial abnormality  . Allergic rhinitis   . Benign neoplasm of colon 04/14/10   3 small polyps on colonoscopy by Dr. Henrene Pastor  . Carpal tunnel syndrome   . Degenerative disc disease   . Disturbances metabolism of methionine, homocystine, and cystathionine (HCC)    Elevated homocysteine  . Elevated prostate specific antigen (PSA)   . Hearing loss   . Hyperlipidemia   . Impotence of organic origin    Penile implant  . Internal hemorrhoids   . Neck pain   . Otosclerosis of both ears   . Peripheral vascular disease (HCC)    Bilateral femoral bruit  . Plantar fasciitis   . Polymyalgia rheumatica ([place])   . Rotator cuff syndrome of left shoulder   . Scoliosis   . Shoulder pain     Past Surgical History:  Procedure Laterality Date  . Actinic keratosis removal  06/21/2010   Left shoulder; Dr. Sarajane Jews  . COLONOSCOPY W/  POLYPECTOMY    . DUPUYTREN CONTRACTURE RELEASE Right 12/05/2013   Procedure: DUPUYTREN CONTRACTURE RELEASE RIGHT LONG, RING AND SMALL FINGERS;  Surgeon: Cammie Sickle., MD;  Location: [place];  Service: Orthopedics;  Laterality: Right;  . Implant penile pump  2001  . Resection of appendix and tip of rectum  February 2005  . STAPEDECTOMY Bilateral 1985, 1988   [employer]    Current Outpatient Prescriptions  Medication Sig Dispense Refill  . acyclovir (ZOVIRAX) 400 MG tablet TAKE ONE TABLET TWICE DAILY DUE TO HERPES INFECTION 60 tablet 5  . acyclovir (ZOVIRAX) 400 MG tablet Take 400 mg by mouth 2 (two) times daily. Due to herpes infection    . aspirin EC 81 MG tablet Take 81 mg by mouth at bedtime.    . butalbital-acetaminophen-caffeine (FIORICET, ESGIC) 50-325-40 MG tablet Take 1-2 tablets by mouth 4 (four) times daily as needed for headache.    . fish oil-omega-3 fatty acids 1000 MG capsule Take 1,000 mg by mouth daily.     . fluorometholone (FML) 0.1 % ophthalmic suspension Place 1 drop into the right eye at bedtime.     Marland Kitchen L-Methylfolate-B12-B6-B2 (METAFOLBIC) 04-28-49-5 MG TABS Take 1 tablet by mouth 2 (two) times daily.    . Multiple Vitamins-Minerals (MULTIVITAMIN PO) Take 1 tablet by mouth daily.     . predniSONE (DELTASONE) 5  MG tablet Take 5 mg by mouth daily. To prevent myalgias    . rosuvastatin (CRESTOR) 5 MG tablet Take 5 mg by mouth daily. To reduce cholesterol    . VITAMIN D, CHOLECALCIFEROL, PO Take 1 tablet by mouth daily.      No current facility-administered medications for this visit.     Allergies:   Codeine and Morphine and related   Social History:  The patient  reports that he has never smoked. He has never used smokeless tobacco. He reports that he does not drink alcohol or use drugs.   Family History:  The patient's family history includes Emphysema in his mother; Heart disease in his mother.   ROS:  Please see the history of present  illness.   All other systems are reviewed and negative.   PHYSICAL EXAM: VS:  BP 118/80   Pulse 80   Ht 6' (1.829 m)   Wt 84.3 kg (185 lb 12.8 oz)   BMI 25.20 kg/m  , BMI Body mass index is 25.2 kg/m. GEN: Well nourished, well developed, pleasant elderly male in no acute distress  HEENT: normal  Neck: no JVD, no masses. No carotid bruits Cardiac: RRR without murmur or gallop                Respiratory:  clear to auscultation bilaterally, normal work of breathing GI: soft, nontender, nondistended, + BS MS: no deformity or atrophy  Ext: no pretibial edema, pedal pulses 2+= bilaterally Skin: warm and dry, no rash Neuro:  Strength and sensation are intact Psych: euthymic mood, full affect  EKG:  EKG is not ordered today. The ekg from 06-22-2014 shows NSR, low-voltage, otherwise within normal limits  Recent Labs: 06/22/2017: TSH 0.888 08/09/2017: ALT 26; BUN 26; Creatinine, Ser 1.19; Hemoglobin 13.3; Platelets 211; Potassium 4.4; Sodium 136   Lipid Panel     Component Value Date/Time   CHOL 148 09/26/2016 0804   CHOL 138 03/21/2016 0819   TRIG 71 09/26/2016 0804   HDL 52 09/26/2016 0804   HDL 56 03/21/2016 0819   CHOLHDL 2.8 09/26/2016 0804   VLDL 14 09/26/2016 0804   LDLCALC 82 09/26/2016 0804   LDLCALC 68 03/21/2016 0819      Wt Readings from Last 3 Encounters:  08/14/17 84.3 kg (185 lb 12.8 oz)  08/09/17 81.8 kg (180 lb 4 oz)  07/24/17 82.4 kg (181 lb 11.2 oz)    ASSESSMENT AND PLAN: 83 yo male with no history of CAD for preoperative cardiac risk stratification. He exercises regularly with no angina or other limitation. His EKG shows no ischemic changes. The patient has no signs or symptoms of heart failure, arrhythmia, or valvular heart disease. He's had no problems with general anesthesia in the past. He can proceed at low risk of adverse cardiac events. No further cardiac testing is indicated in this patient with excellent functional capacity and operative risk of  major cardiac complications predicted to be <1%.  Current medicines are reviewed with the patient today.  The patient does not have concerns regarding medicines.  Labs/ tests ordered today include:  No orders of the defined types were placed in this encounter.   Disposition:   FU as needed. I would be happy to see him back if any problems arise in the future. Thanks for referring this nice gentleman.  Deatra James, MD  08/16/2017 5:31 AM    [address], [place],   [address] Phone: [phone];  Fax: [phone]  

## 2017-08-14 NOTE — Patient Instructions (Signed)
Medication Instructions:  Your provider recommends that you continue on your current medications as directed. Please refer to the Current Medication list given to you today.    Labwork: None  Testing/Procedures: None  Follow-Up: Your provider recommends that you schedule a follow-up appointment AS NEEDED with Dr. Cooper.  Any Other Special Instructions Will Be Listed Below (If Applicable).     If you need a refill on your cardiac medications before your next appointment, please call your pharmacy.   

## 2017-08-15 NOTE — Anesthesia Preprocedure Evaluation (Addendum)
Anesthesia Evaluation  Patient identified by MRN, date of birth, ID band Patient awake    Reviewed: Allergy & Precautions, H&P , NPO status , Patient's Chart, lab work & pertinent test results  Airway Mallampati: II  TM Distance: >3 FB Neck ROM: Full    Dental no notable dental hx. (+) Teeth Intact, Dental Advisory Given   Pulmonary neg pulmonary ROS,    Pulmonary exam normal breath sounds clear to auscultation       Cardiovascular Exercise Tolerance: Good + Peripheral Vascular Disease  negative cardio ROS   Rhythm:Regular Rate:Normal     Neuro/Psych negative neurological ROS  negative psych ROS   GI/Hepatic negative GI ROS, Neg liver ROS,   Endo/Other  negative endocrine ROS  Renal/GU negative Renal ROS  negative genitourinary   Musculoskeletal  (+) Arthritis ,   Abdominal   Peds  Hematology negative hematology ROS (+)   Anesthesia Other Findings   Reproductive/Obstetrics negative OB ROS                            Anesthesia Physical Anesthesia Plan  ASA: II  Anesthesia Plan: General   Post-op Pain Management:    Induction: Intravenous  PONV Risk Score and Plan: 3 and Ondansetron, Dexamethasone and Midazolam  Airway Management Planned: Oral ETT  Additional Equipment: Arterial line, CVP and Ultrasound Guidance Line Placement  Intra-op Plan:   Post-operative Plan: Extubation in OR and Possible Post-op intubation/ventilation  Informed Consent: I have reviewed the patients History and Physical, chart, labs and discussed the procedure including the risks, benefits and alternatives for the proposed anesthesia with the patient or authorized representative who has indicated his/her understanding and acceptance.   Dental advisory given  Plan Discussed with: CRNA  Anesthesia Plan Comments:        Anesthesia Quick Evaluation

## 2017-08-16 ENCOUNTER — Encounter (HOSPITAL_COMMUNITY)
Admission: RE | Disposition: A | Discharge status: Home / Self Care | Payer: Self-pay | Source: Ambulatory Visit | Attending: Surgery

## 2017-08-16 ENCOUNTER — Inpatient Hospital Stay (HOSPITAL_COMMUNITY)
Admission: RE | Admit: 2017-08-16 | Discharge: 2017-08-17 | DRG: 269 | Disposition: A | Discharge status: Home / Self Care | Payer: Medicare Other | Source: Ambulatory Visit | Attending: Surgery | Admitting: Surgery

## 2017-08-16 ENCOUNTER — Encounter (HOSPITAL_COMMUNITY): Payer: Self-pay | Admitting: Certified Registered Nurse Anesthetist

## 2017-08-16 ENCOUNTER — Inpatient Hospital Stay (HOSPITAL_COMMUNITY): Payer: Medicare Other | Admitting: Anesthesiology

## 2017-08-16 ENCOUNTER — Inpatient Hospital Stay (HOSPITAL_COMMUNITY): Payer: Medicare Other | Admitting: Emergency Medicine

## 2017-08-16 ENCOUNTER — Inpatient Hospital Stay (HOSPITAL_COMMUNITY): Payer: Medicare Other

## 2017-08-16 DIAGNOSIS — H9190 Unspecified hearing loss, unspecified ear: Secondary | ICD-10-CM | POA: Diagnosis present

## 2017-08-16 DIAGNOSIS — Z8679 Personal history of other diseases of the circulatory system: Secondary | ICD-10-CM

## 2017-08-16 DIAGNOSIS — I739 Peripheral vascular disease, unspecified: Secondary | ICD-10-CM | POA: Diagnosis present

## 2017-08-16 DIAGNOSIS — E785 Hyperlipidemia, unspecified: Secondary | ICD-10-CM | POA: Diagnosis present

## 2017-08-16 DIAGNOSIS — I714 Abdominal aortic aneurysm, without rupture, unspecified: Secondary | ICD-10-CM | POA: Diagnosis present

## 2017-08-16 DIAGNOSIS — Z452 Encounter for adjustment and management of vascular access device: Secondary | ICD-10-CM | POA: Diagnosis not present

## 2017-08-16 DIAGNOSIS — T82594S Other mechanical complication of infusion catheter, sequela: Secondary | ICD-10-CM

## 2017-08-16 DIAGNOSIS — R109 Unspecified abdominal pain: Secondary | ICD-10-CM | POA: Diagnosis not present

## 2017-08-16 DIAGNOSIS — M353 Polymyalgia rheumatica: Secondary | ICD-10-CM | POA: Diagnosis present

## 2017-08-16 DIAGNOSIS — Z9889 Other specified postprocedural states: Secondary | ICD-10-CM

## 2017-08-16 DIAGNOSIS — E559 Vitamin D deficiency, unspecified: Secondary | ICD-10-CM | POA: Diagnosis not present

## 2017-08-16 HISTORY — PX: ABDOMINAL AORTIC ENDOVASCULAR STENT GRAFT: SHX5707

## 2017-08-16 LAB — BASIC METABOLIC PANEL
Anion gap: 5 (ref 5–15)
BUN: 18 mg/dL (ref 6–20)
CALCIUM: 8.1 mg/dL — AB (ref 8.9–10.3)
CO2: 25 mmol/L (ref 22–32)
Chloride: 107 mmol/L (ref 101–111)
Creatinine, Ser: 0.92 mg/dL (ref 0.61–1.24)
GFR calc non Af Amer: >60 mL/min (ref >60)
Glucose, Bld: 85 mg/dL (ref 65–99)
Potassium: 4.7 mmol/L (ref 3.5–5.1)
SODIUM: 137 mmol/L (ref 135–145)

## 2017-08-16 LAB — PROTIME-INR
INR: 1.09
PROTHROMBIN TIME: 14 s (ref 11.4–15.2)

## 2017-08-16 LAB — CBC
HCT: 38.4 % — ABNORMAL LOW (ref 39.0–52.0)
HEMOGLOBIN: 13.1 g/dL (ref 13.0–17.0)
MCH: 33.2 pg (ref 26.0–34.0)
MCHC: 34.1 g/dL (ref 30.0–36.0)
MCV: 97.5 fL (ref 78.0–100.0)
Platelets: 185 10*3/uL (ref 150–400)
RBC: 3.94 MIL/uL — ABNORMAL LOW (ref 4.22–5.81)
RDW: 13.6 % (ref 11.5–15.5)
WBC: 11.5 10*3/uL — ABNORMAL HIGH (ref 4.0–10.5)

## 2017-08-16 LAB — POCT ACTIVATED CLOTTING TIME
ACTIVATED CLOTTING TIME: 202 s
Activated Clotting Time: 219 seconds

## 2017-08-16 LAB — MAGNESIUM: Magnesium: 2.1 mg/dL (ref 1.7–2.4)

## 2017-08-16 LAB — APTT: aPTT: 30 seconds (ref 24–36)

## 2017-08-16 SURGERY — INSERTION, ENDOVASCULAR STENT GRAFT, AORTA, ABDOMINAL
Anesthesia: General | Site: Abdomen

## 2017-08-16 MED ORDER — IODIXANOL 320 MG/ML IV SOLN
INTRAVENOUS | Status: DC | PRN
  Administered 2017-08-16: 65.7 mL via INTRAVENOUS

## 2017-08-16 MED ORDER — ONDANSETRON HCL 4 MG/2ML IJ SOLN: 4.0000 mg | Freq: Four times a day (QID) | INTRAMUSCULAR | Status: DC | PRN

## 2017-08-16 MED ORDER — LABETALOL HCL 5 MG/ML IV SOLN: 10.0000 mg | INTRAVENOUS | Status: DC | PRN

## 2017-08-16 MED ORDER — LACTATED RINGERS IV SOLN
INTRAVENOUS | Status: DC | PRN
  Administered 2017-08-16: 10:00:00 via INTRAVENOUS

## 2017-08-16 MED ORDER — TRAMADOL HCL 50 MG PO TABS: 50.0000 mg | ORAL_TABLET | Freq: Four times a day (QID) | ORAL | Status: DC | PRN

## 2017-08-16 MED ORDER — BUTALBITAL-APAP-CAFFEINE 50-325-40 MG PO TABS
1.0000 | ORAL_TABLET | Freq: Four times a day (QID) | ORAL | Status: DC | PRN
  Administered 2017-08-16 – 2017-08-17 (×2): 2 via ORAL
  Filled 2017-08-16 (×2): qty 2

## 2017-08-16 MED ORDER — HEPARIN SODIUM (PORCINE) 1000 UNIT/ML IJ SOLN
INTRAMUSCULAR | Status: DC | PRN
  Administered 2017-08-16: 2000 [IU] via INTRAVENOUS
  Administered 2017-08-16: 8000 [IU] via INTRAVENOUS

## 2017-08-16 MED ORDER — L-METHYLFOLATE-B6-B12 3-35-2 MG PO TABS
1.0000 | ORAL_TABLET | Freq: Two times a day (BID) | ORAL | Status: DC
  Filled 2017-08-16: qty 1

## 2017-08-16 MED ORDER — FENTANYL CITRATE (PF) 250 MCG/5ML IJ SOLN
INTRAMUSCULAR | Status: AC
  Filled 2017-08-16: qty 5

## 2017-08-16 MED ORDER — SODIUM CHLORIDE 0.9 % IV SOLN: INTRAVENOUS | Status: DC

## 2017-08-16 MED ORDER — PHENOL 1.4 % MT LIQD
1.0000 | OROMUCOSAL | Status: DC | PRN
  Administered 2017-08-16: 1 via OROMUCOSAL
  Filled 2017-08-16: qty 177

## 2017-08-16 MED ORDER — DEXTROSE 5 % IV SOLN
INTRAVENOUS | Status: DC | PRN
  Administered 2017-08-16: 20 ug/min via INTRAVENOUS

## 2017-08-16 MED ORDER — ROCURONIUM BROMIDE 100 MG/10ML IV SOLN
INTRAVENOUS | Status: DC | PRN
  Administered 2017-08-16: 20 mg via INTRAVENOUS
  Administered 2017-08-16: 50 mg via INTRAVENOUS

## 2017-08-16 MED ORDER — OMEGA-3-ACID ETHYL ESTERS 1 G PO CAPS
1000.0000 mg | ORAL_CAPSULE | Freq: Every day | ORAL | Status: DC
  Administered 2017-08-17: 1000 mg via ORAL
  Filled 2017-08-16: qty 1

## 2017-08-16 MED ORDER — FENTANYL CITRATE (PF) 100 MCG/2ML IJ SOLN: 25.0000 ug | INTRAMUSCULAR | Status: DC | PRN

## 2017-08-16 MED ORDER — PROPOFOL 10 MG/ML IV BOLUS
INTRAVENOUS | Status: AC
  Filled 2017-08-16: qty 20

## 2017-08-16 MED ORDER — LACTATED RINGERS IV SOLN
INTRAVENOUS | Status: DC
  Administered 2017-08-16 (×2): via INTRAVENOUS

## 2017-08-16 MED ORDER — PROTAMINE SULFATE 10 MG/ML IV SOLN
INTRAVENOUS | Status: DC | PRN
  Administered 2017-08-16: 50 mg via INTRAVENOUS

## 2017-08-16 MED ORDER — ASPIRIN EC 81 MG PO TBEC
81.0000 mg | DELAYED_RELEASE_TABLET | Freq: Every day | ORAL | Status: DC
  Filled 2017-08-16: qty 1

## 2017-08-16 MED ORDER — SODIUM CHLORIDE 0.9% FLUSH
10.0000 mL | INTRAVENOUS | Status: DC | PRN
  Administered 2017-08-17: 20 mL
  Filled 2017-08-16: qty 40

## 2017-08-16 MED ORDER — CEFUROXIME SODIUM 1.5 G IV SOLR
1.5000 g | Freq: Two times a day (BID) | INTRAVENOUS | Status: AC
  Administered 2017-08-16 – 2017-08-17 (×2): 1.5 g via INTRAVENOUS
  Filled 2017-08-16 (×2): qty 1.5

## 2017-08-16 MED ORDER — CHLORHEXIDINE GLUCONATE 4 % EX LIQD: 60.0000 mL | Freq: Once | CUTANEOUS | Status: DC

## 2017-08-16 MED ORDER — ALUM & MAG HYDROXIDE-SIMETH 200-200-20 MG/5ML PO SUSP: 15.0000 mL | ORAL | Status: DC | PRN

## 2017-08-16 MED ORDER — ROSUVASTATIN CALCIUM 5 MG PO TABS
5.0000 mg | ORAL_TABLET | Freq: Every day | ORAL | Status: DC
  Filled 2017-08-16 (×2): qty 1

## 2017-08-16 MED ORDER — MIDAZOLAM HCL 5 MG/5ML IJ SOLN
INTRAMUSCULAR | Status: DC | PRN
  Administered 2017-08-16 (×2): 1 mg via INTRAVENOUS

## 2017-08-16 MED ORDER — SODIUM CHLORIDE 0.9 % IV SOLN
INTRAVENOUS | Status: DC | PRN
  Administered 2017-08-16: 500 mL

## 2017-08-16 MED ORDER — SODIUM CHLORIDE 0.9 % IV SOLN: 500.0000 mL | Freq: Once | INTRAVENOUS | Status: DC | PRN

## 2017-08-16 MED ORDER — POTASSIUM CHLORIDE CRYS ER 20 MEQ PO TBCR
20.0000 meq | EXTENDED_RELEASE_TABLET | Freq: Every day | ORAL | Status: DC | PRN
Start: 2017-08-16 — End: 2017-08-17

## 2017-08-16 MED ORDER — HYDROMORPHONE HCL 1 MG/ML IJ SOLN: 0.5000 mg | INTRAMUSCULAR | Status: DC | PRN

## 2017-08-16 MED ORDER — SUGAMMADEX SODIUM 200 MG/2ML IV SOLN
INTRAVENOUS | Status: DC | PRN
  Administered 2017-08-16: 200 mg via INTRAVENOUS

## 2017-08-16 MED ORDER — MIDAZOLAM HCL 2 MG/2ML IJ SOLN
INTRAMUSCULAR | Status: AC
  Filled 2017-08-16: qty 2

## 2017-08-16 MED ORDER — ACYCLOVIR 400 MG PO TABS
400.0000 mg | ORAL_TABLET | Freq: Two times a day (BID) | ORAL | Status: DC
  Administered 2017-08-17: 400 mg via ORAL
  Filled 2017-08-16 (×3): qty 1

## 2017-08-16 MED ORDER — MAGNESIUM SULFATE 2 GM/50ML IV SOLN
2.0000 g | Freq: Every day | INTRAVENOUS | Status: DC | PRN
  Filled 2017-08-16: qty 50

## 2017-08-16 MED ORDER — DOCUSATE SODIUM 100 MG PO CAPS
100.0000 mg | ORAL_CAPSULE | Freq: Every day | ORAL | Status: DC
  Administered 2017-08-17: 100 mg via ORAL
  Filled 2017-08-16: qty 1

## 2017-08-16 MED ORDER — EPHEDRINE SULFATE 50 MG/ML IJ SOLN
INTRAMUSCULAR | Status: DC | PRN
  Administered 2017-08-16 (×2): 10 mg via INTRAVENOUS
  Administered 2017-08-16: 5 mg via INTRAVENOUS

## 2017-08-16 MED ORDER — PANTOPRAZOLE SODIUM 40 MG PO TBEC
40.0000 mg | DELAYED_RELEASE_TABLET | Freq: Every day | ORAL | Status: DC
  Administered 2017-08-17: 40 mg via ORAL
  Filled 2017-08-16: qty 1

## 2017-08-16 MED ORDER — VANCOMYCIN HCL IN DEXTROSE 1-5 GM/200ML-% IV SOLN
1000.0000 mg | INTRAVENOUS | Status: AC
  Administered 2017-08-16: 1000 mg via INTRAVENOUS
  Filled 2017-08-16: qty 200

## 2017-08-16 MED ORDER — PHENYLEPHRINE HCL 10 MG/ML IJ SOLN
INTRAMUSCULAR | Status: DC | PRN
  Administered 2017-08-16: 40 ug via INTRAVENOUS

## 2017-08-16 MED ORDER — 0.9 % SODIUM CHLORIDE (POUR BTL) OPTIME
TOPICAL | Status: DC | PRN
  Administered 2017-08-16: 1000 mL

## 2017-08-16 MED ORDER — FENTANYL CITRATE (PF) 100 MCG/2ML IJ SOLN
INTRAMUSCULAR | Status: DC | PRN
  Administered 2017-08-16 (×3): 50 ug via INTRAVENOUS

## 2017-08-16 MED ORDER — PREDNISONE 5 MG PO TABS
5.0000 mg | ORAL_TABLET | Freq: Every day | ORAL | Status: DC
  Administered 2017-08-17: 5 mg via ORAL
  Filled 2017-08-16: qty 1

## 2017-08-16 MED ORDER — GUAIFENESIN-DM 100-10 MG/5ML PO SYRP: 15.0000 mL | ORAL_SOLUTION | ORAL | Status: DC | PRN

## 2017-08-16 MED ORDER — ONDANSETRON HCL 4 MG/2ML IJ SOLN
INTRAMUSCULAR | Status: DC | PRN
  Administered 2017-08-16: 4 mg via INTRAVENOUS

## 2017-08-16 MED ORDER — HYDRALAZINE HCL 20 MG/ML IJ SOLN: 5.0000 mg | INTRAMUSCULAR | Status: DC | PRN

## 2017-08-16 MED ORDER — ACETAMINOPHEN 325 MG RE SUPP
325.0000 mg | RECTAL | Status: DC | PRN
  Filled 2017-08-16: qty 2

## 2017-08-16 MED ORDER — FA-PYRIDOXINE-CYANOCOBALAMIN 2.5-25-2 MG PO TABS
1.0000 | ORAL_TABLET | Freq: Two times a day (BID) | ORAL | Status: DC
  Administered 2017-08-17: 1 via ORAL
  Filled 2017-08-16 (×3): qty 1

## 2017-08-16 MED ORDER — SODIUM CHLORIDE 0.9 % IV SOLN
INTRAVENOUS | Status: DC
  Administered 2017-08-16: 16:00:00 via INTRAVENOUS

## 2017-08-16 MED ORDER — METOPROLOL TARTRATE 5 MG/5ML IV SOLN: 2.0000 mg | INTRAVENOUS | Status: DC | PRN

## 2017-08-16 MED ORDER — ACETAMINOPHEN 325 MG PO TABS
325.0000 mg | ORAL_TABLET | ORAL | Status: DC | PRN
  Administered 2017-08-16: 650 mg via ORAL
  Filled 2017-08-16: qty 2

## 2017-08-16 MED ORDER — FLUOROMETHOLONE 0.1 % OP SUSP
1.0000 [drp] | Freq: Every day | OPHTHALMIC | Status: DC
  Filled 2017-08-16: qty 5

## 2017-08-16 MED ORDER — PROPOFOL 10 MG/ML IV BOLUS
INTRAVENOUS | Status: DC | PRN
  Administered 2017-08-16: 100 mg via INTRAVENOUS
  Administered 2017-08-16: 50 mg via INTRAVENOUS

## 2017-08-16 MED ORDER — METAFOLBIC 6-1-50-5 MG PO TABS: 1.0000 | ORAL_TABLET | Freq: Two times a day (BID) | ORAL | Status: DC

## 2017-08-16 SURGICAL SUPPLY — 67 items
BAG SNAP BAND KOVER 36X36 (MISCELLANEOUS) ×3 IMPLANT
BLADE CLIPPER SURG (BLADE) ×3 IMPLANT
CANISTER SUCT 3000ML PPV (MISCELLANEOUS) ×3 IMPLANT
CATH ANGIO 5F BER2 65CM (CATHETERS) ×3 IMPLANT
CATH BEACON 5.038 65CM KMP-01 (CATHETERS) IMPLANT
CATH OMNI FLUSH .035X70CM (CATHETERS) ×3 IMPLANT
COVER PROBE W GEL 5X96 (DRAPES) IMPLANT
DERMABOND ADHESIVE PROPEN (GAUZE/BANDAGES/DRESSINGS) ×4
DERMABOND ADVANCED (GAUZE/BANDAGES/DRESSINGS) ×2
DERMABOND ADVANCED .7 DNX12 (GAUZE/BANDAGES/DRESSINGS) ×1 IMPLANT
DERMABOND ADVANCED .7 DNX6 (GAUZE/BANDAGES/DRESSINGS) ×2 IMPLANT
DEVICE CLOSURE PERCLS PRGLD 6F (VASCULAR PRODUCTS) ×4 IMPLANT
DRAPE ZERO GRAVITY STERILE (DRAPES) ×3 IMPLANT
DRSG TEGADERM 2-3/8X2-3/4 SM (GAUZE/BANDAGES/DRESSINGS) ×6 IMPLANT
DRYSEAL FLEXSHEATH 12FR 33CM (SHEATH) ×4
DRYSEAL FLEXSHEATH 18FR 33CM (SHEATH) ×2
ELECT CAUTERY BLADE 6.4 (BLADE) ×3 IMPLANT
ELECT REM PT RETURN 9FT ADLT (ELECTROSURGICAL) ×6
ELECTRODE REM PT RTRN 9FT ADLT (ELECTROSURGICAL) ×2 IMPLANT
EXCLUDER TNK LEG 35MX14X14 (Endovascular Graft) ×1 IMPLANT
EXCLUDER TRUNK LEG 35MX14X14 (Endovascular Graft) ×3 IMPLANT
GAUZE SPONGE 2X2 8PLY STRL LF (GAUZE/BANDAGES/DRESSINGS) ×2 IMPLANT
GLOVE BIOGEL PI IND STRL 7.0 (GLOVE) ×1 IMPLANT
GLOVE BIOGEL PI IND STRL 7.5 (GLOVE) ×1 IMPLANT
GLOVE BIOGEL PI INDICATOR 7.0 (GLOVE) ×2
GLOVE BIOGEL PI INDICATOR 7.5 (GLOVE) ×2
GLOVE SURG SS PI 6.5 STRL IVOR (GLOVE) ×3 IMPLANT
GLOVE SURG SS PI 7.5 STRL IVOR (GLOVE) ×3 IMPLANT
GOWN STRL REUS W/ TWL LRG LVL3 (GOWN DISPOSABLE) ×2 IMPLANT
GOWN STRL REUS W/ TWL XL LVL3 (GOWN DISPOSABLE) ×1 IMPLANT
GOWN STRL REUS W/TWL LRG LVL3 (GOWN DISPOSABLE) ×4
GOWN STRL REUS W/TWL XL LVL3 (GOWN DISPOSABLE) ×2
GRAFT BALLN CATH 65CM (STENTS) ×1 IMPLANT
HEMOSTAT SNOW SURGICEL 2X4 (HEMOSTASIS) IMPLANT
KIT BASIN OR (CUSTOM PROCEDURE TRAY) ×3 IMPLANT
KIT DILATOR VASC 18G NDL (KITS) ×3 IMPLANT
KIT ROOM TURNOVER OR (KITS) ×3 IMPLANT
LEG CONTRALATERAL 16X20X9.5 (Endovascular Graft) ×2 IMPLANT
LEG CONTRALATERAL 20X11.5 (Vascular Products) ×2 IMPLANT
NEEDLE PERC 18GX7CM (NEEDLE) ×3 IMPLANT
NS IRRIG 1000ML POUR BTL (IV SOLUTION) ×3 IMPLANT
PACK ENDOVASCULAR (PACKS) ×3 IMPLANT
PAD ARMBOARD 7.5X6 YLW CONV (MISCELLANEOUS) ×6 IMPLANT
PENCIL BUTTON HOLSTER BLD 10FT (ELECTRODE) ×3 IMPLANT
PERCLOSE PROGLIDE 6F (VASCULAR PRODUCTS) ×12
SHEATH AVANTI 11CM 8FR (MISCELLANEOUS) ×3 IMPLANT
SHEATH BRITE TIP 8FR 23CM (MISCELLANEOUS) ×3 IMPLANT
SHEATH DRYSEAL FLEX 12FR 33CM (SHEATH) ×2 IMPLANT
SHEATH DRYSEAL FLEX 18FR 33CM (SHEATH) ×1 IMPLANT
SHIELD RADPAD SCOOP 12X17 (MISCELLANEOUS) ×6 IMPLANT
SPONGE GAUZE 2X2 STER 10/PKG (GAUZE/BANDAGES/DRESSINGS) ×4
STENT GRAFT BALLN CATH 65CM (STENTS) ×2
STENT GRAFT CONTRALAT 16X20X9. (Endovascular Graft) ×1 IMPLANT
STENT GRAFT CONTRALAT 20X11.5 (Vascular Products) ×1 IMPLANT
STOPCOCK MORSE 400PSI 3WAY (MISCELLANEOUS) ×3 IMPLANT
SUT ETHILON 3 0 PS 1 (SUTURE) IMPLANT
SUT PROLENE 5 0 C 1 24 (SUTURE) IMPLANT
SUT VIC AB 2-0 CT1 27 (SUTURE)
SUT VIC AB 2-0 CT1 TAPERPNT 27 (SUTURE) IMPLANT
SUT VIC AB 3-0 SH 27 (SUTURE)
SUT VIC AB 3-0 SH 27X BRD (SUTURE) IMPLANT
SUT VICRYL 4-0 PS2 18IN ABS (SUTURE) IMPLANT
SYR 30ML LL (SYRINGE) ×3 IMPLANT
TRAY FOLEY W/METER SILVER 16FR (SET/KITS/TRAYS/PACK) ×3 IMPLANT
TUBING HIGH PRESSURE 120CM (CONNECTOR) ×3 IMPLANT
WIRE AMPLATZ SS-J .035X180CM (WIRE) ×6 IMPLANT
WIRE BENTSON .035X145CM (WIRE) ×6 IMPLANT

## 2017-08-16 NOTE — Anesthesia Postprocedure Evaluation (Signed)
Anesthesia Post Note  Patient: [name]  Procedure(s) Performed: Procedure(s) (LRB): ABDOMINAL AORTIC ENDOVASCULAR STENT GRAFT insertion (N/A)     Patient location during evaluation: PACU Anesthesia Type: General Level of consciousness: awake and alert Pain management: pain level controlled Vital Signs Assessment: post-procedure vital signs reviewed and stable Respiratory status: spontaneous breathing, nonlabored ventilation, respiratory function stable and patient connected to nasal cannula oxygen Cardiovascular status: blood pressure returned to baseline and stable Postop Assessment: no apparent nausea or vomiting Anesthetic complications: no    Last Vitals:  Vitals:   08/16/17 1427 08/16/17 1445  BP: 134/75 138/82  Pulse: (!) 59 63  Resp: 12 12  Temp:    SpO2: 98% 100%    Last Pain:  Vitals:   08/16/17 0910  TempSrc: Oral                 Drevin Ortner,W. EDMOND

## 2017-08-16 NOTE — Anesthesia Procedure Notes (Signed)
Procedure Name: Intubation Date/Time: 08/16/2017 10:54 AM Performed by: Ollen Bowl Pre-anesthesia Checklist: Patient identified, Emergency Drugs available, Suction available and Patient being monitored Patient Re-evaluated:Patient Re-evaluated prior to induction Oxygen Delivery Method: Circle system utilized Preoxygenation: Pre-oxygenation with 100% oxygen Induction Type: IV induction Ventilation: Mask ventilation without difficulty Laryngoscope Size: Miller and 3 Grade View: Grade I Tube type: Subglottic suction tube Tube size: 7.5 mm Number of attempts: 1 Airway Equipment and Method: Patient positioned with wedge pillow and Stylet Placement Confirmation: ETT inserted through vocal cords under direct vision,  positive ETCO2 and breath sounds checked- equal and bilateral Secured at: 22 cm Tube secured with: Tape Dental Injury: Teeth and Oropharynx as per pre-operative assessment

## 2017-08-16 NOTE — H&P (View-Only) (Signed)
    Subjective  -Post op Check  Only complaint is sore throat and HA   Physical Exam:  Both groins are soft Abdomen is non-tender   Assessment/Plan:    Keep foley in until am Advance diet OOB once bed rest up Likely d/c in am  Brabham, [place] 08/16/2017 7:04 PM --  Vitals:   08/16/17 1612 08/16/17 1654  BP: 130/77 (!) 144/82  Pulse: 65   Resp: 12   Temp: 97.8 F (36.6 C)   SpO2: 99%     Intake/Output Summary (Last 24 hours) at 08/16/17 1904 Last data filed at 08/16/17 1600  Gross per 24 hour  Intake             1740 ml  Output             1100 ml  Net              640 ml     Laboratory CBC    Component Value Date/Time   WBC 11.5 (H) 08/16/2017 1630   HGB 13.1 08/16/2017 1630   HCT 38.4 (L) 08/16/2017 1630   PLT 185 08/16/2017 1630    BMET    Component Value Date/Time   NA 137 08/16/2017 1630   NA 142 03/21/2016 0819   K 4.7 08/16/2017 1630   CL 107 08/16/2017 1630   CO2 25 08/16/2017 1630   GLUCOSE 85 08/16/2017 1630   BUN 18 08/16/2017 1630   BUN 21 03/21/2016 0819   CREATININE 0.92 08/16/2017 1630   CREATININE 0.91 05/24/2017 0922   CALCIUM 8.1 (L) 08/16/2017 1630   GFRNONAA >60 08/16/2017 1630   GFRNONAA 77 05/24/2017 0922   GFRAA >60 08/16/2017 1630   GFRAA 89 05/24/2017 0922    COAG Lab Results  Component Value Date   INR 1.09 08/16/2017   INR 0.97 08/09/2017   No results found for: PTT  Antibiotics Anti-infectives    Start     Dose/Rate Route Frequency Ordered Stop   08/16/17 2200  acyclovir (ZOVIRAX) tablet 400 mg     400 mg Oral 2 times daily 08/16/17 1611     08/16/17 2000  cefUROXime (ZINACEF) 1.5 g in dextrose 5 % 50 mL IVPB     1.5 g 100 mL/hr over 30 Minutes Intravenous Every 12 hours 08/16/17 1611 08/17/17 1959   08/16/17 0846  vancomycin (VANCOCIN) IVPB 1000 mg/200 mL premix     1,000 mg 200 mL/hr over 60 Minutes Intravenous 60 min pre-op 08/16/17 0846 08/16/17 1125       V. Leia Alf, M.D. [facility] [place] Office: [phone] Pager:  [phone]

## 2017-08-16 NOTE — Interval H&P Note (Signed)
History and Physical Interval Note:  08/16/2017 9:29 PM  [name]  has presented today for surgery, with the diagnosis of Abdominal Aortic Aneurysm  I71.4  The various methods of treatment have been discussed with the patient and family. After consideration of risks, benefits and other options for treatment, the patient has consented to  Procedure(s): ABDOMINAL AORTIC ENDOVASCULAR STENT GRAFT insertion (N/A) as a surgical intervention .  The patient's history has been reviewed, patient examined, no change in status, stable for surgery.  I have reviewed the patient's chart and labs.  Questions were answered to the patient's satisfaction.     Annamarie Major

## 2017-08-16 NOTE — Progress Notes (Signed)
    Subjective  -Post op Check  Only complaint is sore throat and HA   Physical Exam:  Both groins are soft Abdomen is non-tender   Assessment/Plan:    Keep foley in until am Advance diet OOB once bed rest up Likely d/c in am  Authur Cubit, [place] 08/16/2017 7:04 PM --  Vitals:   08/16/17 1612 08/16/17 1654  BP: 130/77 (!) 144/82  Pulse: 65   Resp: 12   Temp: 97.8 F (36.6 C)   SpO2: 99%     Intake/Output Summary (Last 24 hours) at 08/16/17 1904 Last data filed at 08/16/17 1600  Gross per 24 hour  Intake             1740 ml  Output             1100 ml  Net              640 ml     Laboratory CBC    Component Value Date/Time   WBC 11.5 (H) 08/16/2017 1630   HGB 13.1 08/16/2017 1630   HCT 38.4 (L) 08/16/2017 1630   PLT 185 08/16/2017 1630    BMET    Component Value Date/Time   NA 137 08/16/2017 1630   NA 142 03/21/2016 0819   K 4.7 08/16/2017 1630   CL 107 08/16/2017 1630   CO2 25 08/16/2017 1630   GLUCOSE 85 08/16/2017 1630   BUN 18 08/16/2017 1630   BUN 21 03/21/2016 0819   CREATININE 0.92 08/16/2017 1630   CREATININE 0.91 05/24/2017 0922   CALCIUM 8.1 (L) 08/16/2017 1630   GFRNONAA >60 08/16/2017 1630   GFRNONAA 77 05/24/2017 0922   GFRAA >60 08/16/2017 1630   GFRAA 89 05/24/2017 0922    COAG Lab Results  Component Value Date   INR 1.09 08/16/2017   INR 0.97 08/09/2017   No results found for: PTT  Antibiotics Anti-infectives    Start     Dose/Rate Route Frequency Ordered Stop   08/16/17 2200  acyclovir (ZOVIRAX) tablet 400 mg     400 mg Oral 2 times daily 08/16/17 1611     08/16/17 2000  cefUROXime (ZINACEF) 1.5 g in dextrose 5 % 50 mL IVPB     1.5 g 100 mL/hr over 30 Minutes Intravenous Every 12 hours 08/16/17 1611 08/17/17 1959   08/16/17 0846  vancomycin (VANCOCIN) IVPB 1000 mg/200 mL premix     1,000 mg 200 mL/hr over 60 Minutes Intravenous 60 min pre-op 08/16/17 0846 08/16/17 1125       V. Leia Alf, M.D. [facility] [place] Office: [phone] Pager:  [phone]

## 2017-08-16 NOTE — Transfer of Care (Signed)
Immediate Anesthesia Transfer of Care Note  Patient: [name]  Procedure(s) Performed: Procedure(s): ABDOMINAL AORTIC ENDOVASCULAR STENT GRAFT insertion (N/A)  Patient Location: PACU  Anesthesia Type:General  Level of Consciousness: awake and alert   Airway & Oxygen Therapy: Patient Spontanous Breathing and Patient connected to nasal cannula oxygen  Post-op Assessment: Report given to RN, Post -op Vital signs reviewed and stable and Patient moving all extremities  Post vital signs: Reviewed and stable  Last Vitals:  Vitals:   08/16/17 0910 08/16/17 1242  BP: (!) 150/77 140/63  Pulse: 66 77  Resp: 18 17  Temp: 36.7 C 36.6 C  SpO2: 98% 100%    Last Pain:  Vitals:   08/16/17 0910  TempSrc: Oral         Complications: No apparent anesthesia complications

## 2017-08-16 NOTE — Op Note (Signed)
    Patient name: [name] MRN: [id number] DOB: Oct 08, 1930 Sex: male  08/16/2017 Pre-operative Diagnosis: AAA Post-operative diagnosis:  Same Surgeon:  Annamarie Major Assistants:  Barnett Applebaum Procedure:   #1:  Endovascular repair of abdominal aortic aneurysm   #2:  Bilateral u/s guided common femoral artery access   #3:  Catheter in aorta x 2   #4:  Abdominal aortogram   #5:  Distal extension x 1 Anesthesia:  General Blood Loss:  See anesthesia record Specimens:  none  Findings:  Complete exclusion  Devices Used:  Primary right GORE [id number].  Contralateral left 20x11.5, ipsilateral right 20x9.5  Indications:  83 year old with progressive enlargement of his AAA, now measuring 5.2 cm  Procedure:  The patient was identified in the holding area and taken to [place] [facility]  The patient was then placed supine on the table. general anesthesia was administered.  The patient was prepped and draped in the usual sterile fashion.  A time out was called and antibiotics were administered.  U/S was used to evaluate bilateral common femoral arteries which were patent and disease free.  A #11 blade was used to make a skin nick.  Using u/s guidance, bilateral common femoral arteries were accessed with a 18 gauge needle.  A benson wire was inserter.  The subcutaneous tissue was dilated with a 64F dilator.  Proglide devices were deployed at the 11 o'clock and 1 o'clock position for preclosure.   64F sheaths were placed bilaterally.  He was fully heparanized.  Levels were adjusted based on ACT values.  A amplatz wire was placed up the right, followed by a 164F sheath.  A omni-flush catheter was placed on the left at L-1.  The main body device was prepared on the back table which was a GORE 35x14x14 device.  It was advanced into the aorta.  A abdominal aortogram a=was preformed in 10 degrees cranial.  The main body was deployed down to the contralateral gate, landing just below the renal arteries.  Next, the  contralateral gate was cannulated with a omniflush catheter and a benson wire.  The catheter was able to be rotated within the main body, confirming successful cannulation.  A pelvic angiogram was then done in a RAO projection, locating the left hypogastric artery.  Over a amplatz wire, a 64F sheath was inserted into the gate.  The GORE 20x11.5 iliac limb was then inserted and deployed at the level of the hypo.  Next, the remaining ipsilateral limb was deployed.  A pelvic angio from the right groin was done, locating the right hypo. A  GORE 20x 9.5 iliac limb was inserted and deployed, landing at the level of the right hypo.  A Q-50 balloon was used to mold the entire graft.  A completion angiogram was then done which showed exclusion of the aneurysm and continued patency of the renals and iliac vessels.  Benson wires were inserted on both sides.  The sheaths were ten removed and the proglide devices were used to seal the arteriotomies.  50 mg of protamine was given.  Pressure was held for 5 minutes.  Both groins were hemostatic.  A bovie was used to close the skin incision.  He was successfully extubated.  He had triphasic pedal doppler signals.   Disposition:  TO PACU stable   V. Annamarie Major, M.D. [facility] [place] Office: [phone] Pager:  [phone]

## 2017-08-16 NOTE — Progress Notes (Signed)
Pt OOB this pm, dangled first and then stood at edge of bed for about 5 min. Tolerated well. Will continue to monitor.  Jaymes Graff, RN

## 2017-08-16 NOTE — Anesthesia Procedure Notes (Signed)
Central Venous Catheter Insertion Performed by: Roderic Palau, anesthesiologist Start/End9/19/2018 9:50 AM, 08/16/2017 10:00 AM Patient location: Pre-op. Preanesthetic checklist: patient identified, IV checked, site marked, risks and benefits discussed, surgical consent, monitors and equipment checked, pre-op evaluation, timeout performed and anesthesia consent Position: Trendelenburg Lidocaine 1% used for infiltration and patient sedated Hand hygiene performed , maximum sterile barriers used  and Seldinger technique used Catheter size: 8 Fr Total catheter length 16. Central line was placed.Double lumen Procedure performed using ultrasound guided technique. Ultrasound Notes:anatomy identified, needle tip was noted to be adjacent to the nerve/plexus identified, no ultrasound evidence of intravascular and/or intraneural injection and image(s) printed for medical record Attempts: 1 Following insertion, dressing applied, line sutured and Biopatch. Post procedure assessment: blood return through all ports  Patient tolerated the procedure well with no immediate complications.

## 2017-08-16 NOTE — Anesthesia Procedure Notes (Signed)
Arterial Line Insertion Start/End9/19/2018 10:05 AM, 08/16/2017 10:10 AM Performed by: Ollen Bowl, CRNA  Patient location: Pre-op. Preanesthetic checklist: patient identified, IV checked, site marked, risks and benefits discussed, surgical consent, monitors and equipment checked and pre-op evaluation Lidocaine 1% used for infiltration and patient sedated Left, radial was placed Catheter size: 20 G Hand hygiene performed  and maximum sterile barriers used  Allen's test indicative of satisfactory collateral circulation Attempts: 1 Procedure performed without using ultrasound guided technique. Following insertion, dressing applied and Biopatch. Post procedure assessment: normal  Patient tolerated the procedure well with no immediate complications.

## 2017-08-17 LAB — BASIC METABOLIC PANEL
Anion gap: 7 (ref 5–15)
BUN: 14 mg/dL (ref 6–20)
CHLORIDE: 103 mmol/L (ref 101–111)
CO2: 29 mmol/L (ref 22–32)
Calcium: 8.5 mg/dL — ABNORMAL LOW (ref 8.9–10.3)
Creatinine, Ser: 1.13 mg/dL (ref 0.61–1.24)
GFR calc Af Amer: >60 mL/min (ref >60)
GFR, EST NON AFRICAN AMERICAN: 58 mL/min — AB (ref >60)
GLUCOSE: 108 mg/dL — AB (ref 65–99)
POTASSIUM: 4.1 mmol/L (ref 3.5–5.1)
SODIUM: 139 mmol/L (ref 135–145)

## 2017-08-17 LAB — TYPE AND SCREEN
ABO/RH(D): O POS
Antibody Screen: NEGATIVE
Unit division: 0
Unit division: 0

## 2017-08-17 LAB — CBC
HEMATOCRIT: 36.7 % — AB (ref 39.0–52.0)
HEMOGLOBIN: 12.5 g/dL — AB (ref 13.0–17.0)
MCH: 33.8 pg (ref 26.0–34.0)
MCHC: 34.1 g/dL (ref 30.0–36.0)
MCV: 99.2 fL (ref 78.0–100.0)
Platelets: 176 10*3/uL (ref 150–400)
RBC: 3.7 MIL/uL — ABNORMAL LOW (ref 4.22–5.81)
RDW: 14.1 % (ref 11.5–15.5)
WBC: 9.5 10*3/uL (ref 4.0–10.5)

## 2017-08-17 LAB — BPAM RBC
BLOOD PRODUCT EXPIRATION DATE: 201810162359
Blood Product Expiration Date: 201810162359
UNIT TYPE AND RH: 5100
Unit Type and Rh: 5100

## 2017-08-17 MED ORDER — TRAMADOL HCL 50 MG PO TABS: 50.0000 mg | ORAL_TABLET | Freq: Four times a day (QID) | ORAL | 0 refills | Status: DC | PRN

## 2017-08-17 NOTE — Discharge Summary (Signed)
EVAR Discharge Summary   [name] 07-20-1930 83 y.o. male  MRN: [id number]  Admission Date: 08/16/2017  Discharge Date: 08/17/17  Physician: Serafina Mitchell, MD  Admission Diagnosis: AAA (abdominal aortic aneurysm) [facility]) [I71.4]   HPI:   This is a 83 y.o. male who returns today for follow-up of his abdominal aortic aneurysm.  He has previously been followed for approximately 10 years by Dr. Kellie Simmering.  His ultrasound 6 months ago showed a maximum diameter 4.7 cm.  He is here today for CT scan.  He reports having had a cold a few months ago and is still having difficulty with energy levels.  He notices a pain in his back that he has but is alleviated by stretching exercises in the morning.  He takes a statin for hypercholesterolemia.  He remains very active with exercise.  Hospital Course:  The patient was admitted to the hospital and taken to the operating room on 08/16/2017 and underwent: Procedure:   #1:  Endovascular repair of abdominal aortic aneurysm                         #2:  Bilateral u/s guided common femoral artery access                         #3:  Catheter in aorta x 2                         #4:  Abdominal aortogram                         #5:  Distal extension x 1   Intraoperative findings:  Complete exclusion.  The pt tolerated the procedure well and was transported to the PACU in good condition.   By POD 1, he was doing well.  His foley and central line were removed.  He did have some urinary retention, but this did improve.    The remainder of the hospital course consisted of increasing mobilization and increasing intake of solids without difficulty.  CBC    Component Value Date/Time   WBC 9.5 08/17/2017 0407   RBC 3.70 (L) 08/17/2017 0407   HGB 12.5 (L) 08/17/2017 0407   HCT 36.7 (L) 08/17/2017 0407   PLT 176 08/17/2017 0407   MCV 99.2 08/17/2017 0407   MCH 33.8 08/17/2017 0407   MCHC 34.1 08/17/2017 0407   RDW 14.1 08/17/2017 0407   RDW  14.1 02/14/2014 0821   LYMPHSABS 1.2 06/22/2017 1802   LYMPHSABS 1.6 02/14/2014 0821   MONOABS 0.9 06/22/2017 1802   EOSABS 0.0 06/22/2017 1802   EOSABS 0.2 02/14/2014 0821   BASOSABS 0.0 06/22/2017 1802   BASOSABS 0.0 02/14/2014 0821    BMET    Component Value Date/Time   NA 139 08/17/2017 0407   NA 142 03/21/2016 0819   K 4.1 08/17/2017 0407   CL 103 08/17/2017 0407   CO2 29 08/17/2017 0407   GLUCOSE 108 (H) 08/17/2017 0407   BUN 14 08/17/2017 0407   BUN 21 03/21/2016 0819   CREATININE 1.13 08/17/2017 0407   CREATININE 0.91 05/24/2017 0922   CALCIUM 8.5 (L) 08/17/2017 0407   GFRNONAA 58 (L) 08/17/2017 0407   GFRNONAA 77 05/24/2017 0922   GFRAA >60 08/17/2017 0407   GFRAA 89 05/24/2017 0922         Discharge Diagnosis:  AAA (abdominal  aortic aneurysm) ([place]) [I71.4]  Secondary Diagnosis: Patient Active Problem List   Diagnosis Date Noted  . S/P AAA repair 08/16/2017  . Fatigue 04/03/2017  . Abdominal pain, RUQ 03/28/2017  . Sinusitis 03/14/2017  . Skin tear of hand without complication, initial encounter 01/11/2017  . Abrasion, multiple sites 01/11/2017  . Mass of left hand 10/19/2016  . 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 AAA (abdominal aortic aneurysm) without rupture ([place]) 01/26/2016  . Dupuytren's contracture of left hand 08/26/2014  . Contracture of joint of finger of left hand 08/26/2014  . Branch retinal vein occlusion 05/27/2014  . 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 Special screening for malignant neoplasm of prostate 02/18/2014  . Urinary frequency 02/18/2014  . Incomplete bladder emptying 02/18/2014  . AAA (abdominal aortic aneurysm) ([place]) 01/07/2014  . Hyperlipidemia 08/20/2013  . Polymyalgia rheumatica ([place]) 08/20/2013  . Unspecified vitamin D deficiency 08/20/2013  . Impotence of organic origin 08/20/2013  . Floater, vitreous 10/19/2012  . Cerebrovascular disease 09/28/2012  . Cataract, nuclear 03/14/2012  . Corneal macula not  interfering with central vision 03/14/2012  . COLONIC POLYPS, HX OF 03/16/2010   Past Medical History:  Diagnosis Date  . AAA (abdominal aortic aneurysm) ([place])   . Abnormal EKG    Left atrial abnormality  . Allergic rhinitis   . Benign neoplasm of colon 04/14/10   3 small polyps on colonoscopy by Dr. Henrene Pastor  . Carpal tunnel syndrome   . Degenerative disc disease   . Disturbances metabolism of methionine, homocystine, and cystathionine (HCC)    Elevated homocysteine  . Elevated prostate specific antigen (PSA)   . Hearing loss   . Hyperlipidemia   . Impotence of organic origin    Penile implant  . Internal hemorrhoids   . Neck pain   . Otosclerosis of both ears   . Peripheral vascular disease (HCC)    Bilateral femoral bruit  . Plantar fasciitis   . Polymyalgia rheumatica ([place])   . Rotator cuff syndrome of left shoulder   . Scoliosis   . Shoulder pain      Allergies as of 08/17/2017      Reactions   Codeine Nausea And Vomiting   Morphine And Related Nausea And Vomiting      Medication List    TAKE these medications   acyclovir 400 MG tablet Commonly known as:  ZOVIRAX Take 400 mg by mouth 2 (two) times daily. Due to herpes infection   acyclovir 400 MG tablet Commonly known as:  ZOVIRAX TAKE ONE TABLET TWICE DAILY DUE TO HERPES INFECTION   aspirin EC 81 MG tablet Take 81 mg by mouth at bedtime.   butalbital-acetaminophen-caffeine 50-325-40 MG tablet Commonly known as:  FIORICET, ESGIC Take 1-2 tablets by mouth 4 (four) times daily as needed for headache.   fish oil-omega-3 fatty acids 1000 MG capsule Take 1,000 mg by mouth daily.   fluorometholone 0.1 % ophthalmic suspension Commonly known as:  FML Place 1 drop into the right eye at bedtime.   METAFOLBIC 04-28-49-5 MG Tabs Take 1 tablet by mouth 2 (two) times daily.   MULTIVITAMIN PO Take 1 tablet by mouth daily.   predniSONE 5 MG tablet Commonly known as:  DELTASONE Take 5 mg by mouth daily. To prevent  myalgias   rosuvastatin 5 MG tablet Commonly known as:  CRESTOR Take 5 mg by mouth daily. To reduce cholesterol   traMADol 50 MG tablet Commonly known as:  ULTRAM Take 1 tablet (50 mg total) by mouth every  6 (six) hours as needed for moderate pain.   VITAMIN D (CHOLECALCIFEROL) PO Take 1 tablet by mouth daily.            Discharge Care Instructions        Start     Ordered   08/17/17 0000  traMADol (ULTRAM) 50 MG tablet  Every 6 hours PRN    Question:  Supervising Provider  Answer:  Serafina Mitchell   08/17/17 0808      Discharge Instructions:   [facility]   Discharge Instructions  Endovascular Aortic Aneurysm Repair  Please refer to the following instructions for your post-procedure care. Your surgeon or Physician Assistant will discuss any changes with you.  Activity  You are encouraged to walk as much as you can. You can slowly return to normal activities but must avoid strenuous activity and heavy lifting until your doctor tells you it's OK. Avoid activities such as vacuuming or swinging a gold club. It is normal to feel tired for several weeks after your surgery. Do not drive until your doctor gives the OK and you are no longer taking prescription pain medications. It is also normal to have difficulty with sleep habits, eating, and bowel movements after surgery. These will go away with time.  Bathing/Showering  You may shower after you go home. If you have an incision, do not soak in a bathtub, hot tub, or swim until the incision heals completely.  Incision Care  Shower every day. Clean your incision with mild soap and water. Pat the area dry with a clean towel. You do not need a bandage unless otherwise instructed. Do not apply any ointments or creams to your incision. If you clothing is irritating, you may cover your incision with a dry gauze pad.  Diet  Resume your normal diet. There are no special food restrictions  following this procedure. A low fat/low cholesterol diet is recommended for all patients with vascular disease. In order to heal from your surgery, it is CRITICAL to get adequate nutrition. Your body requires vitamins, minerals, and protein. Vegetables are the best source of vitamins and minerals. Vegetables also provide the perfect balance of protein. Processed food has little nutritional value, so try to avoid this.  Medications  Resume taking all of your medications unless your doctor or nurse practitioner tells you not to. If your incision is causing pain, you may take over-the-counter pain relievers such as acetaminophen (Tylenol). If you were prescribed a stronger pain medication, please be aware these medications can cause nausea and constipation. Prevent nausea by taking the medication with a snack or meal. Avoid constipation by drinking plenty of fluids and eating foods with a high amount of fiber, such as fruits, vegetables, and grains. Do not take Tylenol if you are taking prescription pain medications.   Follow up  [place] office will schedule a follow-up appointment with a C.T. scan 3-4 weeks after your surgery.  Please call us immediately for any of the following conditions  Severe or worsening pain in your legs or feet or in your abdomen back or chest. Increased pain, redness, drainage (pus) from your incision sit. Increased abdominal pain, bloating, nausea, vomiting or persistent diarrhea. Fever of 101 degrees or higher. Swelling in your leg (s),  Reduce your risk of vascular disease  .Stop smoking. If you would like help call [facility] at 1-800-QUIT-NOW 714-274-9293) or [place] at 224-344-0141. .Manage your cholesterol .Maintain a desired weight .Control your diabetes .Keep your  blood pressure down  If you have questions, please call the office at [phone].    Prescriptions given: Tramadol#10 No Refill  Disposition: home  Patient's condition: is  Good  Follow up: 1. Dr. [name] in 4 weeks with CTA protocol   Leontine Locket, PA-C [facility] [phone] 08/17/2017  8:09 AM   - For VQI Registry use - Post-op:  Time to Extubation: [x]  In OR, [ ]  < 12 hrs, [ ]  12-24 hrs, [ ]  >=24 hrs Vasopressors Req. Post-op: No MI: No., [ ]  Troponin only, [ ]  EKG or Clinical New Arrhythmia: No CHF: No ICU Stay: 1 day in stepdown Transfusion: No     If yes, n/a units given  Complications: Resp failure: No., [ ]  Pneumonia, [ ]  Ventilator Chg in renal function: No., [ ]  Inc. Cr > 0.5, [ ]  Temp. Dialysis,  [ ]  Permanent dialysis Leg ischemia: No., no Surgery needed, [ ]  Yes, Surgery needed,  [ ]  Amputation Bowel ischemia: No., [ ]  Medical Rx, [ ]  Surgical Rx Wound complication: No., [ ]  Superficial separation/infection, [ ]  Return to OR Return to OR: No  Return to OR for bleeding: No Stroke: No., [ ]  Minor, [ ]  Major  Discharge medications: Statin use:  Yes If No:  ASA use:  Yes  If No:  Plavix use:  No  Beta blocker use:  No  ARB use:  No ACEI use:  No CCB use:  No

## 2017-08-17 NOTE — Care Management Note (Signed)
Case Management Note Marvetta Gibbons RN, BSN Unit 4E-Case Manager [phone]  Patient Details  Name: [name] MRN: [id number] Date of Birth: 09/21/1930  Subjective/Objective:  Pt admitted s/p EVAR                  Action/Plan: PTA pt lived at home- plan for pt to d/c home today-  was notified by Jonelle Sidle with [facility] that they had pre-op referral for any HH needs-  no CM needs noted for discharge.  Expected Discharge Date:  08/17/17               Expected Discharge Plan:  Home/Self Care  In-House Referral:  NA  Discharge planning Services  CM Consult  Post Acute Care Choice:  NA Choice offered to:  NA  DME Arranged:    DME Agency:     HH Arranged:  NA HH Agency:  [facility]  Status of Service:  Completed, signed off  If discussed at [place] of Stay Meetings, dates discussed:    Discharge Disposition: home/self care   Additional Comments:  Dawayne Patricia, RN 08/17/2017, 1:51 PM

## 2017-08-17 NOTE — Progress Notes (Signed)
    Subjective  - POD #s/p EVAR  No complaints this am   Physical Exam:  Groins soft Abdomen soft and non-tender Normal respirations Feet well perfused  Labs ok  Assessment/Plan:  POD #1  Doing great s/p EVAR D/C foley and central line Should be able to d/c once he tolerates PO, has ambulated, and voided, likely later today F/U 1 month with CTA abd pelvis  [place], [place] 08/17/2017 7:45 AM --  Vitals:   08/17/17 0400 08/17/17 0600  BP: 108/67 109/63  Pulse: (!) 55 63  Resp: (!) 0 13  Temp: 98.2 F (36.8 C)   SpO2: 97% 96%    Intake/Output Summary (Last 24 hours) at 08/17/17 0745 Last data filed at 08/17/17 0400  Gross per 24 hour  Intake           3327.5 ml  Output             1100 ml  Net           2227.5 ml     Laboratory CBC    Component Value Date/Time   WBC 9.5 08/17/2017 0407   HGB 12.5 (L) 08/17/2017 0407   HCT 36.7 (L) 08/17/2017 0407   PLT 176 08/17/2017 0407    BMET    Component Value Date/Time   NA 139 08/17/2017 0407   NA 142 03/21/2016 0819   K 4.1 08/17/2017 0407   CL 103 08/17/2017 0407   CO2 29 08/17/2017 0407   GLUCOSE 108 (H) 08/17/2017 0407   BUN 14 08/17/2017 0407   BUN 21 03/21/2016 0819   CREATININE 1.13 08/17/2017 0407   CREATININE 0.91 05/24/2017 0922   CALCIUM 8.5 (L) 08/17/2017 0407   GFRNONAA 58 (L) 08/17/2017 0407   GFRNONAA 77 05/24/2017 0922   GFRAA >60 08/17/2017 0407   GFRAA 89 05/24/2017 0922    COAG Lab Results  Component Value Date   INR 1.09 08/16/2017   INR 0.97 08/09/2017   No results found for: PTT  Antibiotics Anti-infectives    Start     Dose/Rate Route Frequency Ordered Stop   08/16/17 2200  acyclovir (ZOVIRAX) tablet 400 mg     400 mg Oral 2 times daily 08/16/17 1611     08/16/17 2000  cefUROXime (ZINACEF) 1.5 g in dextrose 5 % 50 mL IVPB     1.5 g 100 mL/hr over 30 Minutes Intravenous Every 12 hours 08/16/17 1611 08/17/17 1959   08/16/17 0846  vancomycin (VANCOCIN) IVPB 1000 mg/200  mL premix     1,000 mg 200 mL/hr over 60 Minutes Intravenous 60 min pre-op 08/16/17 0846 08/16/17 1125       V. Leia Alf, M.D. [facility] [place] Office: [phone] Pager:  [phone]

## 2017-08-17 NOTE — Discharge Instructions (Signed)
° °[facility] [place]  ° °Discharge Instructions ° °Endovascular Aortic Aneurysm Repair ° °Please refer to the following instructions for your post-procedure care. Your surgeon or Physician Assistant will discuss any changes with you. ° °Activity ° °You are encouraged to walk as much as you can. You can slowly return to normal activities but must avoid strenuous activity and heavy lifting until your doctor tells you it's OK. Avoid activities such as vacuuming or swinging a gold club. It is normal to feel tired for several weeks after your surgery. Do not drive until your doctor gives the OK and you are no longer taking prescription pain medications. It is also normal to have difficulty with sleep habits, eating, and bowel movements after surgery. These will go away with time. ° °Bathing/Showering ° °You may shower after you go home. If you have an incision, do not soak in a bathtub, hot tub, or swim until the incision heals completely. ° °If you have incisions in your groin, sash the groin wounds with soap and water daily and pat dry. (No tub bath-only shower)  Then put a dry gauze or washcloth there to keep this area dry to help prevent wound infection daily and as needed.  Do not use Vaseline or neosporin on your incisions.  Only use soap and water on your incisions and then protect and keep dry. ° °Incision Care ° °Shower every day. Clean your incision with mild soap and water. Pat the area dry with a clean towel. You do not need a bandage unless otherwise instructed. Do not apply any ointments or creams to your incision. If you clothing is irritating, you may cover your incision with a dry gauze pad. ° °Diet ° °Resume your normal diet. There are no special food restrictions following this procedure. A low fat/low cholesterol diet is recommended for all patients with vascular disease. In order to heal from your surgery, it is CRITICAL to get adequate nutrition. Your body requires  vitamins, minerals, and protein. Vegetables are the best source of vitamins and minerals. Vegetables also provide the perfect balance of protein. Processed food has little nutritional value, so try to avoid this. ° °Medications ° °Resume taking all of your medications unless your doctor or nurse practitioner tells you not to. If your incision is causing pain, you may take over-the-counter pain relievers such as acetaminophen (Tylenol). If you were prescribed a stronger pain medication, please be aware these medications can cause nausea and constipation. Prevent nausea by taking the medication with a snack or meal. Avoid constipation by drinking plenty of fluids and eating foods with a high amount of fiber, such as fruits, vegetables, and grains. Do not take Tylenol if you are taking prescription pain medications. ° ° °Follow up ° °Our office will schedule a follow-up appointment with a C.T. scan 3-4 weeks after your surgery. ° °Please call us immediately for any of the following conditions ° °Severe or worsening pain in your legs or feet or in your abdomen back or chest. °Increased pain, redness, drainage (pus) from your incision sit. °Increased abdominal pain, bloating, nausea, vomiting or persistent diarrhea. °Fever of 101 degrees or higher. °Swelling in your leg (s), ° °Reduce your risk of vascular disease ° °•Stop smoking. If you would like help call [facility] at 1-800-QUIT-NOW (1-800-784-8669) or [place] at 336-586-4000. °•Manage your cholesterol °•Maintain a desired weight °•Control your diabetes °•Keep your blood pressure down ° °If you have questions, please call the office at [phone]. ° °

## 2017-08-18 ENCOUNTER — Encounter (HOSPITAL_COMMUNITY): Payer: Self-pay | Admitting: Surgery

## 2017-08-18 ENCOUNTER — Other Ambulatory Visit: Payer: Self-pay | Admitting: Nurse Practitioner

## 2017-08-18 ENCOUNTER — Telehealth: Payer: Self-pay

## 2017-08-18 NOTE — Consult Note (Signed)
           [facility] CM Primary Care Navigator  08/18/2017  CLEVE PAOLILLO Dec 01, 1929 [id number]   Attempt to seepatient at the bedsideto identify possible discharge needs but he was already discharged per staff report.  Patient was discharged home yesterday.  Primary care provider's officeis listed as doing transition of care (TOC).   For questions, please contact:  Dannielle Huh, BSN, RN- [facility] Primary Care Navigator  Telephone: [phone] [place]

## 2017-08-18 NOTE — Telephone Encounter (Signed)
LMTCB for hospital follow up.  -MM

## 2017-08-21 NOTE — Telephone Encounter (Signed)
Transition Care Management Follow-Up Telephone Call   Date discharged and where: [facility] on 08/17/17  How have you been since you were released from the hospital? Doing better and surprised at how well he is feeling. Pt is urinating more than normal but has not noticed any change in color, smell or appearance. Declined n/v/d or fever.   Any patient concerns? None  Items Reviewed:   Meds: verified  Allergies: verified  Dietary Changes Reviewed: N/A  Functional Questionnaire:  Independent-I Dependent-D  ADLs:   Dressing- I    Eating-I   Maintaining continence- I   Transferring- I   Transportation- I   Meal Prep- I   Managing Meds- I   Confirmed importance and Date/Time of follow-up visits scheduled: Labwork scheduled for 09/26/17. Pt is scheduled to see Dr. Gloriann Loan (urologist) on 08/24/17.   Confirmed with patient if condition worsens to call PCP or go to the Emergency Dept. Patient was given office number and encouraged to call back with questions or concerns: YES

## 2017-08-21 NOTE — Telephone Encounter (Signed)
Patient returned McKenzie's call and would like for her to call him back, he is now available

## 2017-08-21 NOTE — Telephone Encounter (Signed)
LMTCB again. -MM

## 2017-08-22 DIAGNOSIS — L300 Nummular dermatitis: Secondary | ICD-10-CM | POA: Diagnosis not present

## 2017-08-22 DIAGNOSIS — Z85828 Personal history of other malignant neoplasm of skin: Secondary | ICD-10-CM | POA: Diagnosis not present

## 2017-08-22 DIAGNOSIS — L820 Inflamed seborrheic keratosis: Secondary | ICD-10-CM | POA: Diagnosis not present

## 2017-08-22 DIAGNOSIS — L738 Other specified follicular disorders: Secondary | ICD-10-CM | POA: Diagnosis not present

## 2017-08-22 DIAGNOSIS — L821 Other seborrheic keratosis: Secondary | ICD-10-CM | POA: Diagnosis not present

## 2017-08-24 DIAGNOSIS — N401 Enlarged prostate with lower urinary tract symptoms: Secondary | ICD-10-CM | POA: Diagnosis not present

## 2017-08-24 DIAGNOSIS — R351 Nocturia: Secondary | ICD-10-CM | POA: Diagnosis not present

## 2017-08-31 ENCOUNTER — Other Ambulatory Visit: Payer: Self-pay

## 2017-08-31 DIAGNOSIS — Z48812 Encounter for surgical aftercare following surgery on the circulatory system: Secondary | ICD-10-CM

## 2017-08-31 DIAGNOSIS — I714 Abdominal aortic aneurysm, without rupture, unspecified: Secondary | ICD-10-CM

## 2017-09-01 ENCOUNTER — Other Ambulatory Visit: Payer: Self-pay | Admitting: Internal Medicine

## 2017-09-01 ENCOUNTER — Other Ambulatory Visit: Payer: Self-pay | Admitting: Nurse Practitioner

## 2017-09-01 DIAGNOSIS — Z23 Encounter for immunization: Secondary | ICD-10-CM | POA: Diagnosis not present

## 2017-09-01 DIAGNOSIS — I714 Abdominal aortic aneurysm, without rupture: Secondary | ICD-10-CM | POA: Diagnosis not present

## 2017-09-01 DIAGNOSIS — E7849 Other hyperlipidemia: Secondary | ICD-10-CM | POA: Diagnosis not present

## 2017-09-01 DIAGNOSIS — R350 Frequency of micturition: Secondary | ICD-10-CM | POA: Diagnosis not present

## 2017-09-01 DIAGNOSIS — J8410 Pulmonary fibrosis, unspecified: Secondary | ICD-10-CM | POA: Diagnosis not present

## 2017-09-01 DIAGNOSIS — M353 Polymyalgia rheumatica: Secondary | ICD-10-CM | POA: Diagnosis not present

## 2017-09-11 ENCOUNTER — Other Ambulatory Visit: Payer: Medicare Other

## 2017-09-11 ENCOUNTER — Encounter: Payer: Medicare Other | Admitting: Surgery

## 2017-09-18 ENCOUNTER — Other Ambulatory Visit: Payer: Medicare Other

## 2017-09-18 ENCOUNTER — Ambulatory Visit
Admission: RE | Admit: 2017-09-18 | Discharge: 2017-09-18 | Disposition: A | Discharge status: Home / Self Care | Payer: Medicare Other | Source: Ambulatory Visit | Attending: Surgery | Admitting: Surgery

## 2017-09-18 ENCOUNTER — Ambulatory Visit (INDEPENDENT_AMBULATORY_CARE_PROVIDER_SITE_OTHER): Payer: Self-pay | Admitting: Surgery

## 2017-09-18 ENCOUNTER — Encounter: Payer: Self-pay | Admitting: Surgery

## 2017-09-18 VITALS — BP 123/75 | HR 65 | Temp 97.2°F | Resp 20 | Ht 72.0 in | Wt 185.0 lb

## 2017-09-18 DIAGNOSIS — I714 Abdominal aortic aneurysm, without rupture, unspecified: Secondary | ICD-10-CM

## 2017-09-18 DIAGNOSIS — Z48812 Encounter for surgical aftercare following surgery on the circulatory system: Secondary | ICD-10-CM

## 2017-09-18 MED ORDER — IOPAMIDOL (ISOVUE-370) INJECTION 76%
75.0000 mL | Freq: Once | INTRAVENOUS | Status: AC | PRN
  Administered 2017-09-18: 75 mL via INTRAVENOUS

## 2017-09-18 NOTE — Progress Notes (Signed)
Patient name: [name] MRN: [id number] DOB: January 15, 1930 Sex: male  REASON FOR VISIT:     post op  HISTORY OF PRESENT ILLNESS:   [name] is a 83 y.o. male returns today for his first postoperative visit.  He is status post address aneurysm repair on 08/16/2017.  His postoperative course was uncomplicated.  He did have trouble with voiding and has been followed up with urology who has treated him medically with improvement.  He has not had any discomfort from his operation.  He is back to almost normal activity.  CURRENT MEDICATIONS:    Current Outpatient Prescriptions  Medication Sig Dispense Refill  . acyclovir (ZOVIRAX) 400 MG tablet TAKE ONE TABLET TWICE DAILY DUE TO HERPES INFECTION 60 tablet 5  . aspirin EC 81 MG tablet Take 81 mg by mouth at bedtime.    . butalbital-acetaminophen-caffeine (FIORICET, ESGIC) 50-325-40 MG tablet Take 1-2 tablets by mouth 4 (four) times daily as needed for headache.    . fish oil-omega-3 fatty acids 1000 MG capsule Take 1,000 mg by mouth daily.     . fluorometholone (FML) 0.1 % ophthalmic suspension Place 1 drop into the right eye at bedtime.     Marland Kitchen L-Methylfolate-B12-B6-B2 (METAFOLBIC) 04-28-49-5 MG TABS TAKE ONE TABLET TWICE DAILY 60 tablet 2  . Multiple Vitamins-Minerals (MULTIVITAMIN PO) Take 1 tablet by mouth daily.     . predniSONE (DELTASONE) 5 MG tablet Take 5 mg by mouth daily. To prevent myalgias    . rosuvastatin (CRESTOR) 5 MG tablet TAKE ONE TABLET EACH DAY TO REDUCE CHOLESTEROL 90 tablet 1  . traMADol (ULTRAM) 50 MG tablet Take 1 tablet (50 mg total) by mouth every 6 (six) hours as needed for moderate pain. 10 tablet 0  . VITAMIN D, CHOLECALCIFEROL, PO Take 1 tablet by mouth daily.      No current facility-administered medications for this visit.     REVIEW OF SYSTEMS:   [X]  denotes positive finding, [ ]  denotes negative finding Cardiac  Comments:  Chest pain or chest pressure:      Shortness of breath upon exertion:    Short of breath when lying flat:    Irregular heart rhythm:    Constitutional    Fever or chills:      PHYSICAL EXAM:   Vitals:   09/18/17 1421  BP: 123/75  Pulse: 65  Resp: 20  Temp: (!) 97.2 F (36.2 C)  TempSrc: Oral  SpO2: 100%  Weight: 185 lb (83.9 kg)  Height: 6' (1.829 m)    GENERAL: The patient is a well-nourished male, in no acute distress. The vital signs are documented above. CARDIOVASCULAR: There is a regular rate and rhythm. PULMONARY: Non-labored respirations Groin incisions are healing nicely.  Abdomen is soft.  STUDIES:   I have reviewed his CT angiogram of the following findings: VASCULAR  1. Post endovascular repair of infrarenal abdominal aortic aneurysm without evidence complication. 2. Suspected tiny presumed type 2 endoleak without change in size of the native abdominal aortic aneurysm, again measuring approximately 5.2 cm in diameter. While the exact etiology of the suspected tiny endoleak is not depicted on this examination, it is likely attributable to an adjacent lumbar artery. Continued attention on follow-up is recommended. 3. Aortic aneurysm NOS (ICD10-I71.9). Aortic Atherosclerosis (ICD10-I70.0). 4. 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 NON-VASCULAR  1. No acute findings within the abdomen or pelvis. 2. Unchanged  hepatic and renal cysts.    MEDICAL ISSUES:   Status post endovascular aneurysm repair: The patient is doing well without complications.  I did discuss a small type II endoleak.  I'm going to have him follow-up in 6 months with a repeat CT scan.  I'm also going to get a CT scan of the chest to follow up on a pulmonary nodule.  Annamarie Major, MD [facility] [phone] Pager [phone]

## 2017-09-26 ENCOUNTER — Other Ambulatory Visit: Payer: Medicare Other

## 2017-09-26 DIAGNOSIS — Z13820 Encounter for screening for osteoporosis: Secondary | ICD-10-CM | POA: Diagnosis not present

## 2017-09-27 DIAGNOSIS — H1789 Other corneal scars and opacities: Secondary | ICD-10-CM | POA: Diagnosis not present

## 2017-09-27 DIAGNOSIS — B0052 Herpesviral keratitis: Secondary | ICD-10-CM | POA: Diagnosis not present

## 2017-09-27 DIAGNOSIS — H2513 Age-related nuclear cataract, bilateral: Secondary | ICD-10-CM | POA: Diagnosis not present

## 2017-09-28 ENCOUNTER — Ambulatory Visit: Payer: Medicare Other | Admitting: Nurse Practitioner

## 2017-10-02 NOTE — Addendum Note (Signed)
Addended by: Lianne Cure A on: 10/02/2017 03:58 PM   Modules accepted: Orders

## 2017-11-01 DIAGNOSIS — B0052 Herpesviral keratitis: Secondary | ICD-10-CM | POA: Diagnosis not present

## 2017-11-01 DIAGNOSIS — H2513 Age-related nuclear cataract, bilateral: Secondary | ICD-10-CM | POA: Diagnosis not present

## 2017-11-01 DIAGNOSIS — H179 Unspecified corneal scar and opacity: Secondary | ICD-10-CM | POA: Diagnosis not present

## 2017-11-02 ENCOUNTER — Telehealth: Payer: Self-pay

## 2017-11-02 NOTE — Telephone Encounter (Signed)
I spoke with the patient's wife, Razi Hickle, who stated that the patient now sees Dr. Crist Infante. VDM (DD)

## 2017-11-17 ENCOUNTER — Other Ambulatory Visit: Payer: Self-pay | Admitting: Nurse Practitioner

## 2017-11-22 ENCOUNTER — Other Ambulatory Visit: Payer: Self-pay | Admitting: Nurse Practitioner

## 2017-11-23 DIAGNOSIS — R351 Nocturia: Secondary | ICD-10-CM | POA: Diagnosis not present

## 2017-11-23 DIAGNOSIS — N401 Enlarged prostate with lower urinary tract symptoms: Secondary | ICD-10-CM | POA: Diagnosis not present

## 2017-12-26 DIAGNOSIS — D485 Neoplasm of uncertain behavior of skin: Secondary | ICD-10-CM | POA: Diagnosis not present

## 2017-12-26 DIAGNOSIS — D225 Melanocytic nevi of trunk: Secondary | ICD-10-CM | POA: Diagnosis not present

## 2017-12-26 DIAGNOSIS — L821 Other seborrheic keratosis: Secondary | ICD-10-CM | POA: Diagnosis not present

## 2017-12-26 DIAGNOSIS — D1801 Hemangioma of skin and subcutaneous tissue: Secondary | ICD-10-CM | POA: Diagnosis not present

## 2017-12-26 DIAGNOSIS — C44319 Basal cell carcinoma of skin of other parts of face: Secondary | ICD-10-CM | POA: Diagnosis not present

## 2017-12-26 DIAGNOSIS — Z85828 Personal history of other malignant neoplasm of skin: Secondary | ICD-10-CM | POA: Diagnosis not present

## 2017-12-26 DIAGNOSIS — C44612 Basal cell carcinoma of skin of right upper limb, including shoulder: Secondary | ICD-10-CM | POA: Diagnosis not present

## 2018-01-04 DIAGNOSIS — M25562 Pain in left knee: Secondary | ICD-10-CM | POA: Diagnosis not present

## 2018-01-11 DIAGNOSIS — C44319 Basal cell carcinoma of skin of other parts of face: Secondary | ICD-10-CM | POA: Diagnosis not present

## 2018-01-11 DIAGNOSIS — Z85828 Personal history of other malignant neoplasm of skin: Secondary | ICD-10-CM | POA: Diagnosis not present

## 2018-01-17 DIAGNOSIS — M25562 Pain in left knee: Secondary | ICD-10-CM | POA: Diagnosis not present

## 2018-01-22 ENCOUNTER — Other Ambulatory Visit: Payer: Self-pay | Admitting: Nurse Practitioner

## 2018-01-23 DIAGNOSIS — M25562 Pain in left knee: Secondary | ICD-10-CM | POA: Diagnosis not present

## 2018-01-24 ENCOUNTER — Emergency Department (HOSPITAL_COMMUNITY)
Admission: EM | Admit: 2018-01-24 | Discharge: 2018-01-24 | Disposition: A | Discharge status: Home / Self Care | Payer: Medicare Other | Attending: Emergency Medicine | Admitting: Emergency Medicine

## 2018-01-24 ENCOUNTER — Other Ambulatory Visit: Payer: Self-pay

## 2018-01-24 ENCOUNTER — Encounter (HOSPITAL_COMMUNITY): Payer: Self-pay | Admitting: Emergency Medicine

## 2018-01-24 ENCOUNTER — Emergency Department (HOSPITAL_COMMUNITY): Payer: Medicare Other

## 2018-01-24 DIAGNOSIS — I714 Abdominal aortic aneurysm, without rupture: Secondary | ICD-10-CM | POA: Diagnosis not present

## 2018-01-24 DIAGNOSIS — Z79899 Other long term (current) drug therapy: Secondary | ICD-10-CM | POA: Insufficient documentation

## 2018-01-24 DIAGNOSIS — J948 Other specified pleural conditions: Secondary | ICD-10-CM | POA: Diagnosis not present

## 2018-01-24 DIAGNOSIS — R61 Generalized hyperhidrosis: Secondary | ICD-10-CM

## 2018-01-24 DIAGNOSIS — Z7982 Long term (current) use of aspirin: Secondary | ICD-10-CM | POA: Insufficient documentation

## 2018-01-24 DIAGNOSIS — D72828 Other elevated white blood cell count: Secondary | ICD-10-CM | POA: Insufficient documentation

## 2018-01-24 DIAGNOSIS — R1084 Generalized abdominal pain: Secondary | ICD-10-CM

## 2018-01-24 LAB — COMPREHENSIVE METABOLIC PANEL
ALT: 21 U/L (ref 17–63)
AST: 22 U/L (ref 15–41)
Albumin: 3.4 g/dL — ABNORMAL LOW (ref 3.5–5.0)
Alkaline Phosphatase: 56 U/L (ref 38–126)
Anion gap: 10 (ref 5–15)
BILIRUBIN TOTAL: 0.7 mg/dL (ref 0.3–1.2)
BUN: 22 mg/dL — AB (ref 6–20)
CHLORIDE: 102 mmol/L (ref 101–111)
CO2: 26 mmol/L (ref 22–32)
CREATININE: 1.09 mg/dL (ref 0.61–1.24)
Calcium: 8.8 mg/dL — ABNORMAL LOW (ref 8.9–10.3)
GFR, EST NON AFRICAN AMERICAN: 60 mL/min — AB (ref >60)
Glucose, Bld: 105 mg/dL — ABNORMAL HIGH (ref 65–99)
POTASSIUM: 3.4 mmol/L — AB (ref 3.5–5.1)
Sodium: 138 mmol/L (ref 135–145)
TOTAL PROTEIN: 6.2 g/dL — AB (ref 6.5–8.1)

## 2018-01-24 LAB — CBC
HCT: 39.1 % (ref 39.0–52.0)
Hemoglobin: 13.3 g/dL (ref 13.0–17.0)
MCH: 33.5 pg (ref 26.0–34.0)
MCHC: 34 g/dL (ref 30.0–36.0)
MCV: 98.5 fL (ref 78.0–100.0)
PLATELETS: 203 10*3/uL (ref 150–400)
RBC: 3.97 MIL/uL — AB (ref 4.22–5.81)
RDW: 14.2 % (ref 11.5–15.5)
WBC: 11.6 10*3/uL — AB (ref 4.0–10.5)

## 2018-01-24 LAB — URINALYSIS, ROUTINE W REFLEX MICROSCOPIC
Bilirubin Urine: NEGATIVE
Glucose, UA: NEGATIVE mg/dL
Hgb urine dipstick: NEGATIVE
KETONES UR: NEGATIVE mg/dL
LEUKOCYTES UA: NEGATIVE
NITRITE: NEGATIVE
PROTEIN: NEGATIVE mg/dL
Specific Gravity, Urine: 1.01 (ref 1.005–1.030)
pH: 5 (ref 5.0–8.0)

## 2018-01-24 LAB — SEDIMENTATION RATE: Sed Rate: 11 mm/hr (ref 0–16)

## 2018-01-24 LAB — C-REACTIVE PROTEIN: CRP: 1.3 mg/dL — ABNORMAL HIGH (ref <1.0)

## 2018-01-24 LAB — TSH: TSH: 1.892 u[IU]/mL (ref 0.350–4.500)

## 2018-01-24 LAB — LIPASE, BLOOD: LIPASE: 29 U/L (ref 11–51)

## 2018-01-24 MED ORDER — IOPAMIDOL (ISOVUE-370) INJECTION 76%
INTRAVENOUS | Status: AC
  Administered 2018-01-24: 100 mL
  Filled 2018-01-24: qty 100

## 2018-01-24 MED ORDER — CEPHALEXIN 250 MG PO CAPS
500.0000 mg | ORAL_CAPSULE | Freq: Once | ORAL | Status: AC
  Administered 2018-01-24: 500 mg via ORAL
  Filled 2018-01-24: qty 2

## 2018-01-24 NOTE — ED Triage Notes (Signed)
Pt c/o abdominal pain (upper abdomen), and night sweats x 5 days. Pt denies any CP or weakness A&Ox4. Pt is ambulatory but currently sitting in a wheel chair. Denies N/V

## 2018-01-24 NOTE — ED Provider Notes (Signed)
[facility] EMERGENCY DEPARTMENT Provider Note   CSN: [id number] Arrival date & time: 01/24/18  1349     History   Chief Complaint Chief Complaint  Patient presents with  . Abdominal Pain    HPI [name] is a 84 y.o. male.  Complaint is sweats, and indigestion.  HPI "[name]" is an 84 year old male.  He is in excellent health.  He works out daily.  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  Reports 7 days of symptoms.  He wakes up sweating.  Denies fever but has not taken temperature.  Does not get chills or Reiger's.  He describes "indigestion".  He states this feels like discomfort in his epigastrium.  Does not have postprandial symptoms.  Feels "bloated" at times.  Normal bowel movements.  No dysuria.  Good appetite.  Normal p.o. intake without nausea or vomiting.  No history of biliary colic or fatty food intolerance.  Known abdominal aortic aneurysm status post endograft infrarenal aneurysm September 2018.  Had small endovascular steel on most recent CT.  Exercises without chest pain or dyspnea.  No chest pain or dyspnea at rest.  He rides a recumbent bike daily without claudication.  No cough.  No URI symptoms.  He did have URI symptoms through January on 2 occasions but not for greater than 2 weeks now.  History of polyps.  Last colonoscopy 7 years ago.  Normal PSAs.  Past Medical History:  Diagnosis Date  . AAA (abdominal aortic aneurysm) ([place])   . Abnormal EKG    Left atrial abnormality  . Allergic rhinitis   . Benign neoplasm of colon 04/14/10   3 small polyps on colonoscopy by Dr. Henrene Pastor  . Carpal tunnel syndrome   . Degenerative disc disease   . Disturbances metabolism of methionine, homocystine, and cystathionine (HCC)    Elevated homocysteine  . Elevated prostate specific antigen (PSA)   . Hearing loss   . Hyperlipidemia   . Impotence of organic origin    Penile implant  . Internal hemorrhoids   . Neck pain     . Otosclerosis of both ears   . Peripheral vascular disease (HCC)    Bilateral femoral bruit  . Plantar fasciitis   . Polymyalgia rheumatica ([place])   . Rotator cuff syndrome of left shoulder   . Scoliosis   . Shoulder pain     Patient Active Problem List   Diagnosis Date Noted  . S/P AAA repair 08/16/2017  . Fatigue 04/03/2017  . Abdominal pain, RUQ 03/28/2017  . Sinusitis 03/14/2017  . Skin tear of hand without complication, initial encounter 01/11/2017  . Abrasion, multiple sites 01/11/2017  . Mass of left hand 10/19/2016  . 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 AAA (abdominal aortic aneurysm) without rupture ([place]) 01/26/2016  . Dupuytren's contracture of left hand 08/26/2014  . Contracture of joint of finger of left hand 08/26/2014  . Branch retinal vein occlusion 05/27/2014  . 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 Special screening for malignant neoplasm of prostate 02/18/2014  . Urinary frequency 02/18/2014  . Incomplete bladder emptying 02/18/2014  . AAA (abdominal aortic aneurysm) ([place]) 01/07/2014  . Hyperlipidemia 08/20/2013  . Polymyalgia rheumatica ([place]) 08/20/2013  . Unspecified vitamin D deficiency 08/20/2013  . Impotence of organic origin 08/20/2013  . Floater, vitreous 10/19/2012  . Cerebrovascular disease 09/28/2012  . Cataract, nuclear 03/14/2012  . Corneal macula not interfering with central vision 03/14/2012  . COLONIC POLYPS, HX OF  03/16/2010    Past Surgical History:  Procedure Laterality Date  . ABDOMINAL AORTIC ENDOVASCULAR STENT GRAFT N/A 08/16/2017   Procedure: ABDOMINAL AORTIC ENDOVASCULAR STENT GRAFT insertion;  Surgeon: Serafina Mitchell, MD;  Location: [facility] OR;  Service: Vascular;  Laterality: N/A;  . Actinic keratosis removal  06/21/2010   Left shoulder; Dr. Sarajane Jews  . COLONOSCOPY W/ POLYPECTOMY    . DUPUYTREN CONTRACTURE RELEASE Right 12/05/2013   Procedure: DUPUYTREN CONTRACTURE RELEASE RIGHT LONG, RING AND SMALL FINGERS;  Surgeon: Cammie Sickle., MD;  Location: [facility];  Service: Orthopedics;  Laterality: Right;  . Implant penile pump  2001  . Resection of appendix and tip of rectum  February 2005  . STAPEDECTOMY Bilateral 1985, [place] Medications    Prior to Admission medications   Medication Sig Start Date End Date Taking? Authorizing Provider  acyclovir (ZOVIRAX) 400 MG tablet ONE TABLET TWICE A DAY DUE TO HERPES INFECTION 01/22/18   [name], NP  aspirin EC 81 MG tablet Take 81 mg by mouth at bedtime.    [provider]  butalbital-acetaminophen-caffeine (FIORICET, ESGIC) 50-325-40 MG tablet Take 1-2 tablets by mouth 4 (four) times daily as needed for headache.    [provider]  fish oil-omega-3 fatty acids 1000 MG capsule Take 1,000 mg by mouth daily.     [provider]  fluorometholone (FML) 0.1 % ophthalmic suspension Place 1 drop into the right eye at bedtime.     [provider]  L-Methylfolate-B12-B6-B2 (METAFOLBIC) 04-28-49-5 MG TABS TAKE ONE TABLET TWICE DAILY 08/18/17   [name], NP  Multiple Vitamins-Minerals (MULTIVITAMIN PO) Take 1 tablet by mouth daily.     [provider]  predniSONE (DELTASONE) 5 MG tablet Take 5 mg by mouth daily. To prevent myalgias    [provider]  rosuvastatin (CRESTOR) 5 MG tablet TAKE ONE TABLET EACH DAY TO REDUCE CHOLESTEROL 09/01/17   [name], NP  traMADol (ULTRAM) 50 MG tablet Take 1 tablet (50 mg total) by mouth every 6 (six) hours as needed for moderate pain. 08/17/17   Rhyne, Hulen Shouts, PA-C  VITAMIN D, CHOLECALCIFEROL, PO Take 1 tablet by mouth daily.     [provider]    Family History Family History  Problem Relation Age of Onset  . Heart disease Mother   . Emphysema Mother     Social History Social History   Tobacco Use  . Smoking status: Never Smoker  . Smokeless tobacco: Never Used  Substance Use Topics  . Alcohol  use: No    Alcohol/week: 0.0 oz  . Drug use: No     Allergies   Codeine and Morphine and related   Review of Systems Review of Systems  Constitutional: Positive for diaphoresis. Negative for appetite change, chills, fatigue and fever.  HENT: Negative for mouth sores, sore throat and trouble swallowing.   Eyes: Negative for visual disturbance.  Respiratory: Negative for cough, chest tightness, shortness of breath and wheezing.   Cardiovascular: Negative for chest pain.  Gastrointestinal: Negative for abdominal distention, abdominal pain, diarrhea, nausea and vomiting.       "Indigestion" described as mild discomfort but "not pain".  No dark urine or light stools.  Endocrine: Negative for polydipsia, polyphagia and polyuria.  Genitourinary: Negative for dysuria, frequency and hematuria.  Musculoskeletal: Negative for gait problem.  Skin: Negative for color change, pallor and rash.  Neurological:  Negative for dizziness, syncope, light-headedness and headaches.  Hematological: Does not bruise/bleed easily.  Psychiatric/Behavioral: Negative for behavioral problems and confusion.     Physical Exam Updated Vital Signs BP (!) 153/82   Pulse 71   Temp 98 F (36.7 C) (Oral)   Resp 18   Ht 6' (1.829 m)   Wt 83.9 kg (185 lb)   SpO2 100%   BMI 25.09 kg/m   Physical Exam  Constitutional: He is oriented to person, place, and time. He appears well-developed and well-nourished. No distress.  HENT:  Head: Normocephalic.  Eyes: Conjunctivae are normal. Pupils are equal, round, and reactive to light. No scleral icterus.  Subtle "sallow" appearance to the conjunctivae.  Not frankly icteric.  Symmetric.  Conjunctive a not pale.  Neck: Normal range of motion. Neck supple. No thyromegaly present.  Cardiovascular: Normal rate and regular rhythm. Exam reveals no gallop and no friction rub.  No murmur heard. Pulmonary/Chest: Effort normal and breath sounds normal. No respiratory distress. He  has no wheezes. He has no rales.  Abdominal: Soft. Bowel sounds are normal. He exhibits no distension. There is no tenderness. There is no rebound.  No reproducible tenderness in the upper abdomen.  He points to his epigastrium as his area of discomfort.  No palpable mass or pulsations.  Musculoskeletal: Normal range of motion.  Neurological: He is alert and oriented to person, place, and time.  Skin: Skin is warm and dry. No rash noted.  Psychiatric: He has a normal mood and affect. His behavior is normal.     ED Treatments / Results  Labs (all labs ordered are listed, but only abnormal results are displayed) Labs Reviewed  COMPREHENSIVE METABOLIC PANEL - Abnormal; Notable for the following components:      Result Value   Potassium 3.4 (*)    Glucose, Bld 105 (*)    BUN 22 (*)    Calcium 8.8 (*)    Total Protein 6.2 (*)    Albumin 3.4 (*)    GFR calc non Af Amer 60 (*)    All other components within normal limits  CBC - Abnormal; Notable for the following components:   WBC 11.6 (*)    RBC 3.97 (*)    All other components within normal limits  URINALYSIS, ROUTINE W REFLEX MICROSCOPIC - Abnormal; Notable for the following components:   APPearance HAZY (*)    All other components within normal limits  C-REACTIVE PROTEIN - Abnormal; Notable for the following components:   CRP 1.3 (*)    All other components within normal limits  CULTURE, BLOOD (SINGLE)  URINE CULTURE  CULTURE, BLOOD (SINGLE)  LIPASE, BLOOD  SEDIMENTATION RATE  TSH    EKG  EKG Interpretation  Date/Time:  Wednesday January 24 2018 13:58:21 EST Ventricular Rate:  83 PR Interval:  190 QRS Duration: 78 QT Interval:  362 QTC Calculation: 425 R Axis:   -3 Text Interpretation:  Normal sinus rhythm Low voltage QRS Borderline ECG Confirmed by Tanna Furry [phone]) on 01/24/2018 2:58:03 PM       Radiology Dg Chest 2 View  Result Date: 01/24/2018 CLINICAL DATA:  Night sweats. EXAM: CHEST  2 VIEW COMPARISON:   Radiograph of August 16, 2017. FINDINGS: The heart size and mediastinal contours are within normal limits. No pneumothorax or pleural effusion is noted. Right lung is clear. Stable left basilar scarring is noted. No acute pulmonary disease is noted. The visualized skeletal structures are unremarkable. IMPRESSION: No active cardiopulmonary disease. Electronically Signed  By: Marijo Conception, M.D.   On: 01/24/2018 15:29   Ct Angio Abd/pel W And/or Wo Contrast  Result Date: 01/24/2018 CLINICAL DATA:  Abdominal aortic aneurysm post endovascular repair in September 2018 EXAM: CT ANGIOGRAPHY ABDOMEN AND PELVIS WITH CONTRAST AND WITHOUT CONTRAST TECHNIQUE: Multidetector CT imaging of the abdomen and pelvis was performed using the standard protocol during bolus administration of intravenous contrast. Multiplanar reconstructed images and MIPs were obtained and reviewed to evaluate the vascular anatomy. CONTRAST:  141m ISOVUE-370 IOPAMIDOL (ISOVUE-370) INJECTION 76% COMPARISON:  None. FINDINGS: VASCULAR Aorta: Post endovascular repair of infrarenal abdominal aortic aneurysm. The stent graft appears widely patent though note is made of a minimal amount of mural thrombus within the stent graft (image 84, series 6), minimally progressed in the interval, though not resulting in a hemodynamically significant stenosis. There is persistent filling of the native abdominal aortic aneurysm sac (arterial image 96, series 6,;delayed images 58 and 63, series 12), compatible with an endoleak. Again, while the exact etiology of the endoleak is not identified, it is presumably secondary to adjacent lumbar arterial collaterals. The native abdominal aortic aneurysm sac is grossly unchanged measuring approximately 4.6 x 4.5 x 5.3 cm as measured in greatest oblique short axis axial (image 97, series 6), coronal (image 62, series 8) and sagittal (image 107, series 9) dimensions respectively, grossly unchanged, previously, 4.7 x 4.6 x  5.2 cm. Focal outpouching involving the caudal left lateral aspect of the native abdominal aortic aneurysm sac is grossly unchanged measuring approximately 1.2 x 1.0 cm (coronal image 58, series 10). No periaortic stranding. Celiac: There is a minimal amount of mixed calcified and noncalcified atherosclerotic plaque involving the origin the celiac artery, not resulting in a hemodynamically significant stenosis. Conventional branching pattern. SMA: Widely patent without hemodynamically significant stenosis. Conventional branching pattern. Renals: Solitary right and duplicated left renal arteries. Re demonstrated beaded irregularity involving the bilateral renal arteries, left greater than right (coronal image 76, 78 and 85, series 8), compatible with FMD. IMA: Occluded at its origin with early reconstitution via collateral supply from the SMA. Inflow: The distal limbs of the endovascular stent graft are well apposed against the walls of the bilateral common iliac arteries. Bilateral common iliac arteries remain mildly ectatic, unchanged. Unchanged mild beaded irregularity is noted involving the bilateral external iliac arteries (coronal images 72, 76 and 89, series 8). The bilateral external and internal iliac arteries are tortuous though patent and of normal caliber. Proximal Outflow: The bilateral common, superficial and deep femoral arteries appear patent throughout their imaged course. Veins: The IVC and pelvic venous system appears widely patent. Review of the MIP images confirms the above findings. NON-VASCULAR Lower chest: Limited visualization of lower thorax demonstrates minimal dependent subsegmental atelectasis within the imaged lung bases. No discrete focal airspace opacities. No pleural effusion. Normal heart size. Calcifications of the aortic root. No pericardial effusion. Hepatobiliary: Normal hepatic contour. Re demonstrated approximately 2.1 cm hypoattenuating (12 Hounsfield unit) cyst within the  dome of the right lobe of the liver. Additional hypoattenuating hepatic lesions are too small to adequately characterize of favored to represent additional hepatic cysts. Normal appearance of the gallbladder given underdistention. No radial stones. No intra extrahepatic biliary duct dilatation. No ascites. Pancreas: Normal appearance of the pancreas Spleen: Normal appearance of the spleen Adrenals/Urinary Tract: There is symmetric enhancement and excretion of the bilateral kidneys. Note is made of an approximately 3.3 cm hypoattenuating (8 Hounsfield unit) cyst within the interpolar aspect the right kidney (45, series 12).  Note is again made of mild atrophy of the left kidney in comparison to the right. No renal stones. There is a minimal amount of grossly symmetric bilateral likely age and body habitus related perinephric stranding. No urine obstruction. Normal appearance the bilateral adrenal glands. There is mass effect of the prostate upon the undersurface urinary bladder. Otherwise, normal appearance of the urinary bladder. Stomach/Bowel: Large colonic stool burden without evidence of enteric obstruction. The appendix is not visualized compatible provided operative history. No pneumoperitoneum, pneumatosis or portal venous gas. Lymphatic: No bulky retroperitoneal, mesenteric, pelvic or inguinal lymphadenopathy. Reproductive: Post penile prosthesis. There is mass effect on the undersurface urinary bladder secondary to the prostate gland. Other: Mild diffuse anasarca, most conspicuous about the midline of the low back. Musculoskeletal: No acute or aggressive osseous abnormalities. Stigmata of DISH within the thoracic spine. Moderate severe multilevel lumbar spine DDD, worse at L1-L2, L2-L3 and L4-L5 with disc space height loss, endplate irregularity and sclerosis. IMPRESSION: VASCULAR 1. Post endovascular repair of infrarenal abdominal aortic aneurysm with similar findings of an endoleak. While the exact etiology  of this presumed type 2 endoleak is not depicted on today's examination, it is again favored to be secondary to adjacent lumbar arterial collaterals. The native abdominal aortic aneurysm sac is unchanged in size, measuring approximately4.6 x 4.5 x 5.3 cm, previously, 4.7 x 4.6 x 5.2 cm. 2. Aortic aneurysm NOS (ICD10-I71.9). Aortic Atherosclerosis (ICD10-I70.0). 3. 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 NON-VASCULAR 1. No acute findings within the abdomen or pelvis. 2. Prostate appears enlarged with mass effect on the undersurface of the urinary bladder. Electronically Signed   By: Sandi Mariscal M.D.   On: 01/24/2018 17:47    Procedures Procedures (including critical care time)  Medications Ordered in ED Medications  iopamidol (ISOVUE-370) 76 % injection (100 mLs  Contrast Given 01/24/18 1604)  cephALEXin (KEFLEX) capsule 500 mg (500 mg Oral Given 01/24/18 1812)     Initial Impression / Assessment and Plan / ED Course  I have reviewed the triage vital signs and the nursing notes.  Pertinent labs & imaging results that were available during my care of the patient were reviewed by me and considered in my medical decision making (see chart for details).   Afebrile to oral temperature here.  Chest x-ray shows no infiltrates or effusion.  Urine does not show infection.  CT angiogram of the aorta was obtained.  Nightly sweats and vague abdominal pain.  It does not appear ill or septic/toxic.  No signs of peripheral embolization.  Doubt endograft infection.  Cultures were obtained.  Leukocytosis of 11.6.  CRP slightly up 1.3.  Normal sed rate 11.  I discussed the case with Dr. Oneida Alar.  He discussed the case with Dr. [name], [facility] vascular surgeon.  Dr. [name] has met and evaluated [name] in the emergency room and does not feel this is graft related.  Until cultures return and we will continue him on p.o.  antibiotics.  Have asked him to recheck with new or worsening symptoms.  Otherwise routine follow-up with his vascular surgeon, and primary care.  Since diagnosis is doubt endograft infection.  Doubt bacteremia as this is recurrent without obvious operative source.  Possible virus.  No sign of pneumonia.  No additional infectious symptoms.  Final Clinical Impressions(s) / ED Diagnoses   Final diagnoses:  Generalized abdominal pain  Night sweats  Other elevated white blood cell (WBC)  count    ED Discharge Orders    None       Tanna Furry, MD 01/24/18 [phone]

## 2018-01-24 NOTE — Discharge Instructions (Signed)
Continue Keflex 3 times per day until your blood cultures are completed. Final blood culture results should be available by Sunday.  Dr. Jeneen Rinks will check results daily, and contact you. Your antibiotic prescription has been called into your pharmacy.  You do not need to start the prescription until tomorrow.

## 2018-01-24 NOTE — Consult Note (Signed)
[facility] [place]  Patient name: [name] MRN: [id number] DOB: Apr 01, 1930 Sex: male   REQUESTING PROVIDER:    ER   REASON FOR CONSULT:    Epigastric pain  HISTORY OF PRESENT ILLNESS:   [name] is a 84 y.o. male, who was told to come to the emergency department by his primary care physician for pain in his epigastric area as well as night sweats for approximately the past week.  Earlier today he underwent CT scan of the abdomen and pelvis.  The patient is well-known to me, having undergone endovascular aneurysm repair on 08/16/2017.  He had an uncomplicated postoperative course.  At his 1 month follow-up he was doing well.  There was evidence of a type II endoleak on his CT scan.  PAST MEDICAL HISTORY    Past Medical History:  Diagnosis Date  . AAA (abdominal aortic aneurysm) ([place])   . Abnormal EKG    Left atrial abnormality  . Allergic rhinitis   . Benign neoplasm of colon 04/14/10   3 small polyps on colonoscopy by Dr. Henrene Pastor  . Carpal tunnel syndrome   . Degenerative disc disease   . Disturbances metabolism of methionine, homocystine, and cystathionine (HCC)    Elevated homocysteine  . Elevated prostate specific antigen (PSA)   . Hearing loss   . Hyperlipidemia   . Impotence of organic origin    Penile implant  . Internal hemorrhoids   . Neck pain   . Otosclerosis of both ears   . Peripheral vascular disease (HCC)    Bilateral femoral bruit  . Plantar fasciitis   . Polymyalgia rheumatica ([place])   . Rotator cuff syndrome of left shoulder   . Scoliosis   . Shoulder pain      FAMILY HISTORY   Family History  Problem Relation Age of Onset  . Heart disease Mother   . Emphysema Mother     SOCIAL HISTORY:   Social History   Socioeconomic History  . Marital status: Married    Spouse name: [name]  . Number of children: 2  . Years of education: Not on file  . Highest education  level: Not on file  Social Needs  . Financial resource strain: Not on file  . Food insecurity - worry: Not on file  . Food insecurity - inability: Not on file  . Transportation needs - medical: Not on file  . Transportation needs - non-medical: Not on file  Occupational History  . Occupation: Physicist, medical: [employer]  Tobacco Use  . Smoking status: Never Smoker  . Smokeless tobacco: Never Used  Substance and Sexual Activity  . Alcohol use: No    Alcohol/week: 0.0 oz  . Drug use: No  . Sexual activity: Not Currently  Other Topics Concern  . 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    ALLERGIES:    Allergies  Allergen Reactions  . Codeine Nausea And Vomiting  . Morphine And Related Nausea And Vomiting    CURRENT MEDICATIONS:    No current facility-administered medications for this encounter.    Current Outpatient Medications  Medication Sig Dispense Refill  . acyclovir (ZOVIRAX) 400 MG tablet ONE TABLET TWICE A DAY DUE TO HERPES INFECTION 60 tablet 5  . aspirin EC 81 MG tablet Take 81 mg by mouth at bedtime.    Marland Kitchen  butalbital-acetaminophen-caffeine (FIORICET, ESGIC) 50-325-40 MG tablet Take 1-2 tablets by mouth 4 (four) times daily as needed for headache.    . fish oil-omega-3 fatty acids 1000 MG capsule Take 1,000 mg by mouth daily.     . fluorometholone (FML) 0.1 % ophthalmic suspension Place 1 drop into the right eye at bedtime.     Marland Kitchen L-Methylfolate-B12-B6-B2 (METAFOLBIC) 04-28-49-5 MG TABS TAKE ONE TABLET TWICE DAILY 60 tablet 2  . Multiple Vitamins-Minerals (MULTIVITAMIN PO) Take 1 tablet by mouth daily.     . predniSONE (DELTASONE) 5 MG tablet Take 5 mg by mouth daily. To prevent myalgias    . rosuvastatin (CRESTOR) 5 MG tablet TAKE ONE TABLET EACH DAY TO REDUCE CHOLESTEROL 90 tablet 1  . traMADol (ULTRAM) 50 MG tablet Take 1 tablet (50 mg total) by  mouth every 6 (six) hours as needed for moderate pain. 10 tablet 0  . VITAMIN D, CHOLECALCIFEROL, PO Take 1 tablet by mouth daily.       REVIEW OF SYSTEMS:   [X]  denotes positive finding, [ ]  denotes negative finding Cardiac  Comments:  Chest pain or chest pressure:    Shortness of breath upon exertion:    Short of breath when lying flat:    Irregular heart rhythm:        Vascular    Pain in calf, thigh, or hip brought on by ambulation:    Pain in feet at night that wakes you up from your sleep:     Blood clot in your veins:    Leg swelling:         Pulmonary    Oxygen at home:    Productive cough:     Wheezing:         Neurologic    Sudden weakness in arms or legs:     Sudden numbness in arms or legs:     Sudden onset of difficulty speaking or slurred speech:    Temporary loss of vision in one eye:     Problems with dizziness:         Gastrointestinal    Blood in stool:      Vomited blood:         Genitourinary    Burning when urinating:     Blood in urine:        Psychiatric    Major depression:         Hematologic    Bleeding problems:    Problems with blood clotting too easily:        Skin    Rashes or ulcers:        Constitutional    Fever or chills: x    PHYSICAL EXAM:   Vitals:   01/24/18 1402 01/24/18 1430 01/24/18 1445 01/24/18 1500  BP: 140/79 139/71 132/70 129/74  Pulse: 78 80 68 65  Resp:  14 12 16   Temp: 97.9 F (36.6 C)     TempSrc: Oral     SpO2: 100% 99% 99% 98%  Weight:      Height:        GENERAL: The patient is a well-nourished male, in no acute distress. The vital signs are documented above. CARDIAC: There is a regular rate and rhythm.  PULMONARY: Nonlabored respirations ABDOMEN: Soft and non-tender MUSCULOSKELETAL: There are no major deformities or cyanosis. NEUROLOGIC: No focal weakness or paresthesias are detected. SKIN: There are no ulcers or rashes noted. PSYCHIATRIC: The patient has a normal affect.  STUDIES:   I  have reviewed his CT scan with the following findings: 1. Post endovascular repair of infrarenal abdominal aortic aneurysm with similar findings of an endoleak. While the exact etiology of this presumed type 2 endoleak is not depicted on today's examination, it is again favored to be secondary to adjacent lumbar arterial collaterals. The native abdominal aortic aneurysm sac is unchanged in size, measuring approximately4.6 x 4.5 x 5.3 cm, previously, 4.7 x 4.6 x 5.2 cm. 2. Aortic aneurysm NOS (ICD10-I71.9). Aortic Atherosclerosis (ICD10-I70.0). 3. 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  NON-VASCULAR  1. No acute findings within the abdomen or pelvis. 2. Prostate appears enlarged with mass effect on the undersurface of the urinary bladder.   ASSESSMENT and PLAN   I do not see any relationship between his aneurysm repair and his current symptoms.  Please contact me for any additional concerns or questions.  I have scheduled the patient for his routine follow-up.   Annamarie Major, MD [facility] [phone] Pager [phone]

## 2018-01-24 NOTE — ED Notes (Signed)
ED Provider at bedside. 

## 2018-01-24 NOTE — ED Notes (Signed)
D/c reviewed with patient and spouse. No further questions at this time 

## 2018-01-25 ENCOUNTER — Telehealth: Payer: Self-pay | Admitting: Surgery

## 2018-01-25 LAB — URINE CULTURE: Culture: NO GROWTH

## 2018-01-25 NOTE — Telephone Encounter (Signed)
-----   Message from Mena Goes, RN sent at 01/25/2018  8:25 AM EST ----- Regarding: cancel appt   ----- Message ----- From: Serafina Mitchell, MD Sent: 01/24/2018   6:18 PM To: Vvs Charge Pool  01/24/2018: Level 3 consult  Because I saw the patient today in the ER with a CT scan, I discussed canceling his follow-up appointment in April, and having him come back 6 months from today with a CT angiogram of the abdomen and pelvis.

## 2018-01-25 NOTE — Telephone Encounter (Signed)
Resched VWB appt to 08/06/18 at 12:15. Put appt in WQ for 6 m CTA orders.

## 2018-01-29 LAB — CULTURE, BLOOD (SINGLE)
CULTURE: NO GROWTH
Culture: NO GROWTH
SPECIAL REQUESTS: ADEQUATE
Special Requests: ADEQUATE

## 2018-02-08 ENCOUNTER — Ambulatory Visit (HOSPITAL_COMMUNITY)
Admission: RE | Admit: 2018-02-08 | Discharge: 2018-02-08 | Disposition: A | Discharge status: Home / Self Care | Payer: Medicare Other | Source: Ambulatory Visit | Attending: Orthopedic Surgery | Admitting: Orthopedic Surgery

## 2018-02-08 ENCOUNTER — Other Ambulatory Visit (HOSPITAL_COMMUNITY): Payer: Self-pay | Admitting: Orthopedic Surgery

## 2018-02-08 DIAGNOSIS — M25562 Pain in left knee: Secondary | ICD-10-CM | POA: Diagnosis not present

## 2018-02-08 DIAGNOSIS — M7989 Other specified soft tissue disorders: Secondary | ICD-10-CM

## 2018-02-08 DIAGNOSIS — M79605 Pain in left leg: Secondary | ICD-10-CM | POA: Diagnosis not present

## 2018-02-08 DIAGNOSIS — S83242A Other tear of medial meniscus, current injury, left knee, initial encounter: Secondary | ICD-10-CM | POA: Diagnosis not present

## 2018-02-08 DIAGNOSIS — M79662 Pain in left lower leg: Secondary | ICD-10-CM | POA: Diagnosis not present

## 2018-02-08 NOTE — Progress Notes (Signed)
Left lower extremity venous duplex has been completed. Negative for DVT. Results were given to Carly at Dr. Berenice Primas' office.  02/08/18 1:25 PM Carlos Levering RVT

## 2018-03-05 ENCOUNTER — Other Ambulatory Visit: Payer: Self-pay | Admitting: Nurse Practitioner

## 2018-03-13 ENCOUNTER — Telehealth: Payer: Self-pay | Admitting: Surgery

## 2018-03-13 NOTE — Telephone Encounter (Signed)
Sched CTA 07/31/18 at [facility] at 9:30. Sched MD 08/06/18 at 12:15. Mailed cta letter.

## 2018-03-13 NOTE — Telephone Encounter (Signed)
-----   Message from Mena Goes, RN sent at 01/25/2018  8:25 AM EST ----- Regarding: cancel appt   ----- Message ----- From: Serafina Mitchell, MD Sent: 01/24/2018   6:18 PM To: Vvs Charge Pool  01/24/2018: Level 3 consult  Because I saw the patient today in the ER with a CT scan, I discussed canceling his follow-up appointment in April, and having him come back 6 months from today with a CT angiogram of the abdomen and pelvis.

## 2018-03-26 ENCOUNTER — Ambulatory Visit: Payer: Medicare Other | Admitting: Surgery

## 2018-04-04 DIAGNOSIS — H1789 Other corneal scars and opacities: Secondary | ICD-10-CM | POA: Diagnosis not present

## 2018-04-04 DIAGNOSIS — B308 Other viral conjunctivitis: Secondary | ICD-10-CM | POA: Diagnosis not present

## 2018-04-04 DIAGNOSIS — H1032 Unspecified acute conjunctivitis, left eye: Secondary | ICD-10-CM | POA: Diagnosis not present

## 2018-04-04 DIAGNOSIS — B0052 Herpesviral keratitis: Secondary | ICD-10-CM | POA: Diagnosis not present

## 2018-04-04 DIAGNOSIS — H2513 Age-related nuclear cataract, bilateral: Secondary | ICD-10-CM | POA: Diagnosis not present

## 2018-04-04 DIAGNOSIS — H538 Other visual disturbances: Secondary | ICD-10-CM | POA: Diagnosis not present

## 2018-04-13 DIAGNOSIS — Z85828 Personal history of other malignant neoplasm of skin: Secondary | ICD-10-CM | POA: Diagnosis not present

## 2018-04-13 DIAGNOSIS — L239 Allergic contact dermatitis, unspecified cause: Secondary | ICD-10-CM | POA: Diagnosis not present

## 2018-05-09 DIAGNOSIS — M79641 Pain in right hand: Secondary | ICD-10-CM | POA: Diagnosis not present

## 2018-05-09 DIAGNOSIS — M79642 Pain in left hand: Secondary | ICD-10-CM | POA: Diagnosis not present

## 2018-05-10 DIAGNOSIS — C4441 Basal cell carcinoma of skin of scalp and neck: Secondary | ICD-10-CM | POA: Diagnosis not present

## 2018-05-10 DIAGNOSIS — L570 Actinic keratosis: Secondary | ICD-10-CM | POA: Diagnosis not present

## 2018-05-10 DIAGNOSIS — Z85828 Personal history of other malignant neoplasm of skin: Secondary | ICD-10-CM | POA: Diagnosis not present

## 2018-05-10 DIAGNOSIS — D485 Neoplasm of uncertain behavior of skin: Secondary | ICD-10-CM | POA: Diagnosis not present

## 2018-05-23 DIAGNOSIS — E7849 Other hyperlipidemia: Secondary | ICD-10-CM | POA: Diagnosis not present

## 2018-05-23 DIAGNOSIS — R82998 Other abnormal findings in urine: Secondary | ICD-10-CM | POA: Diagnosis not present

## 2018-05-23 DIAGNOSIS — Z125 Encounter for screening for malignant neoplasm of prostate: Secondary | ICD-10-CM | POA: Diagnosis not present

## 2018-05-30 DIAGNOSIS — M353 Polymyalgia rheumatica: Secondary | ICD-10-CM | POA: Diagnosis not present

## 2018-05-30 DIAGNOSIS — I714 Abdominal aortic aneurysm, without rupture: Secondary | ICD-10-CM | POA: Diagnosis not present

## 2018-05-30 DIAGNOSIS — N183 Chronic kidney disease, stage 3 (moderate): Secondary | ICD-10-CM | POA: Diagnosis not present

## 2018-05-30 DIAGNOSIS — R972 Elevated prostate specific antigen [PSA]: Secondary | ICD-10-CM | POA: Diagnosis not present

## 2018-05-30 DIAGNOSIS — B009 Herpesviral infection, unspecified: Secondary | ICD-10-CM | POA: Diagnosis not present

## 2018-05-30 DIAGNOSIS — E785 Hyperlipidemia, unspecified: Secondary | ICD-10-CM | POA: Diagnosis not present

## 2018-05-30 DIAGNOSIS — Z6825 Body mass index (BMI) 25.0-25.9, adult: Secondary | ICD-10-CM | POA: Diagnosis not present

## 2018-05-30 DIAGNOSIS — Z23 Encounter for immunization: Secondary | ICD-10-CM | POA: Diagnosis not present

## 2018-05-30 DIAGNOSIS — D7589 Other specified diseases of blood and blood-forming organs: Secondary | ICD-10-CM | POA: Diagnosis not present

## 2018-05-30 DIAGNOSIS — Z1389 Encounter for screening for other disorder: Secondary | ICD-10-CM | POA: Diagnosis not present

## 2018-05-30 DIAGNOSIS — J8410 Pulmonary fibrosis, unspecified: Secondary | ICD-10-CM | POA: Diagnosis not present

## 2018-05-30 DIAGNOSIS — R351 Nocturia: Secondary | ICD-10-CM | POA: Diagnosis not present

## 2018-05-30 DIAGNOSIS — R5383 Other fatigue: Secondary | ICD-10-CM | POA: Diagnosis not present

## 2018-05-30 DIAGNOSIS — Z0000 Encounter for general adult medical examination without abnormal findings: Secondary | ICD-10-CM | POA: Diagnosis not present

## 2018-06-01 DIAGNOSIS — B0052 Herpesviral keratitis: Secondary | ICD-10-CM | POA: Diagnosis not present

## 2018-06-01 DIAGNOSIS — Z01818 Encounter for other preprocedural examination: Secondary | ICD-10-CM | POA: Diagnosis not present

## 2018-06-01 DIAGNOSIS — H179 Unspecified corneal scar and opacity: Secondary | ICD-10-CM | POA: Diagnosis not present

## 2018-06-01 DIAGNOSIS — H1032 Unspecified acute conjunctivitis, left eye: Secondary | ICD-10-CM | POA: Diagnosis not present

## 2018-06-01 DIAGNOSIS — H2513 Age-related nuclear cataract, bilateral: Secondary | ICD-10-CM | POA: Diagnosis not present

## 2018-06-04 DIAGNOSIS — Z1212 Encounter for screening for malignant neoplasm of rectum: Secondary | ICD-10-CM | POA: Diagnosis not present

## 2018-06-04 DIAGNOSIS — H2513 Age-related nuclear cataract, bilateral: Secondary | ICD-10-CM | POA: Diagnosis not present

## 2018-06-07 DIAGNOSIS — H25811 Combined forms of age-related cataract, right eye: Secondary | ICD-10-CM | POA: Diagnosis not present

## 2018-06-07 DIAGNOSIS — H179 Unspecified corneal scar and opacity: Secondary | ICD-10-CM | POA: Diagnosis not present

## 2018-06-07 DIAGNOSIS — H169 Unspecified keratitis: Secondary | ICD-10-CM | POA: Diagnosis not present

## 2018-06-12 ENCOUNTER — Other Ambulatory Visit: Payer: Self-pay | Admitting: Internal Medicine

## 2018-06-12 ENCOUNTER — Ambulatory Visit
Admission: RE | Admit: 2018-06-12 | Discharge: 2018-06-12 | Disposition: A | Discharge status: Home / Self Care | Payer: Medicare Other | Source: Ambulatory Visit | Attending: Internal Medicine | Admitting: Internal Medicine

## 2018-06-12 DIAGNOSIS — J8410 Pulmonary fibrosis, unspecified: Secondary | ICD-10-CM

## 2018-06-12 DIAGNOSIS — R05 Cough: Secondary | ICD-10-CM | POA: Diagnosis not present

## 2018-06-12 DIAGNOSIS — Z85828 Personal history of other malignant neoplasm of skin: Secondary | ICD-10-CM | POA: Diagnosis not present

## 2018-06-12 DIAGNOSIS — L570 Actinic keratosis: Secondary | ICD-10-CM | POA: Diagnosis not present

## 2018-06-14 DIAGNOSIS — Z1211 Encounter for screening for malignant neoplasm of colon: Secondary | ICD-10-CM | POA: Diagnosis not present

## 2018-06-14 DIAGNOSIS — Z1212 Encounter for screening for malignant neoplasm of rectum: Secondary | ICD-10-CM | POA: Diagnosis not present

## 2018-06-18 ENCOUNTER — Telehealth: Payer: Self-pay | Admitting: Surgery

## 2018-06-18 NOTE — Telephone Encounter (Signed)
Re-Sched. Appt. LVm 08/20/18 3:30pm CTA  f/u MD.

## 2018-07-09 ENCOUNTER — Other Ambulatory Visit: Payer: Self-pay | Admitting: Nurse Practitioner

## 2018-07-11 DIAGNOSIS — S80811S Abrasion, right lower leg, sequela: Secondary | ICD-10-CM | POA: Diagnosis not present

## 2018-07-11 DIAGNOSIS — Z85828 Personal history of other malignant neoplasm of skin: Secondary | ICD-10-CM | POA: Diagnosis not present

## 2018-07-11 DIAGNOSIS — D692 Other nonthrombocytopenic purpura: Secondary | ICD-10-CM | POA: Diagnosis not present

## 2018-07-17 ENCOUNTER — Encounter: Payer: Self-pay | Admitting: Internal Medicine

## 2018-07-31 ENCOUNTER — Other Ambulatory Visit: Payer: Medicare Other

## 2018-08-01 ENCOUNTER — Ambulatory Visit
Admission: RE | Admit: 2018-08-01 | Discharge: 2018-08-01 | Disposition: A | Discharge status: Home / Self Care | Payer: Medicare Other | Source: Ambulatory Visit | Attending: Surgery | Admitting: Surgery

## 2018-08-01 DIAGNOSIS — I714 Abdominal aortic aneurysm, without rupture, unspecified: Secondary | ICD-10-CM

## 2018-08-01 DIAGNOSIS — Z95828 Presence of other vascular implants and grafts: Secondary | ICD-10-CM | POA: Diagnosis not present

## 2018-08-01 DIAGNOSIS — K7689 Other specified diseases of liver: Secondary | ICD-10-CM | POA: Diagnosis not present

## 2018-08-01 MED ORDER — IOPAMIDOL (ISOVUE-370) INJECTION 76%
75.0000 mL | Freq: Once | INTRAVENOUS | Status: AC | PRN
  Administered 2018-08-01: 75 mL via INTRAVENOUS

## 2018-08-02 DIAGNOSIS — Z85828 Personal history of other malignant neoplasm of skin: Secondary | ICD-10-CM | POA: Diagnosis not present

## 2018-08-02 DIAGNOSIS — L570 Actinic keratosis: Secondary | ICD-10-CM | POA: Diagnosis not present

## 2018-08-02 DIAGNOSIS — C44329 Squamous cell carcinoma of skin of other parts of face: Secondary | ICD-10-CM | POA: Diagnosis not present

## 2018-08-02 DIAGNOSIS — D485 Neoplasm of uncertain behavior of skin: Secondary | ICD-10-CM | POA: Diagnosis not present

## 2018-08-06 ENCOUNTER — Ambulatory Visit: Payer: Medicare Other | Admitting: Surgery

## 2018-08-20 ENCOUNTER — Ambulatory Visit: Payer: Medicare Other | Admitting: Surgery

## 2018-09-01 DIAGNOSIS — Z23 Encounter for immunization: Secondary | ICD-10-CM | POA: Diagnosis not present

## 2018-09-11 ENCOUNTER — Ambulatory Visit (INDEPENDENT_AMBULATORY_CARE_PROVIDER_SITE_OTHER): Payer: Medicare Other | Admitting: Internal Medicine

## 2018-09-11 ENCOUNTER — Encounter: Payer: Self-pay | Admitting: Internal Medicine

## 2018-09-11 VITALS — BP 102/74 | HR 89 | Ht 72.0 in | Wt 185.0 lb

## 2018-09-11 DIAGNOSIS — Z86010 Personal history of colonic polyps: Secondary | ICD-10-CM

## 2018-09-11 DIAGNOSIS — R195 Other fecal abnormalities: Secondary | ICD-10-CM

## 2018-09-11 NOTE — Progress Notes (Signed)
HISTORY OF PRESENT ILLNESS:  [name] "[name]" is a 84 y.o. male, former 9 of [employer], with past medical history as listed below who is sent today by his primary care provider Dr. Joylene Draft regarding positive Cologuard testing.  The patient has a history of a large villous adenoma of the cecum diagnosed elsewhere for which she underwent surgical resection with Dr. Dalbert Batman around 2005.  Negative follow-up examination 2007.  I last saw the patient and performed surveillance colonoscopy Apr 14, 2010.  At that time he was found to have 2 diminutive tubular adenomas.  Follow-up in 5 years to be considered based on overall health status and motivation.  He was sent a recall letter at the appropriate time but did not schedule recommended office follow-up.  Overall he has been active.  He did undergo endovascular stent graft placement for aortic aneurysm.  He remains active with the [employer].  Review of outside laboratories from February 2019 finds normal hemoglobin of 13.3.  Cologuard testing performed around June 04, 2018 returned positive.  CT scan of the abdomen and pelvis performed September 2019 shows stable chronic changes in the abdomen including prior placement of aortoiliac stent graft.  Patient's GI review of systems is entirely negative.  REVIEW OF SYSTEMS:  All non-GI ROS negative as otherwise stated in the HPI except for sinus and allergies  Past Medical History:  Diagnosis Date  . AAA (abdominal aortic aneurysm) ([place])   . Abnormal EKG    Left atrial abnormality  . Allergic rhinitis   . Benign neoplasm of colon 04/14/10   3 small polyps on colonoscopy by Dr. Henrene Pastor  . Carpal tunnel syndrome   . Degenerative disc disease   . Disturbances metabolism of methionine, homocystine, and cystathionine (HCC)    Elevated homocysteine  . Elevated prostate specific antigen (PSA)   . Hearing loss   . Hyperlipidemia   . Impotence of organic origin    Penile implant  . Internal hemorrhoids    . Neck pain   . Otosclerosis of both ears   . Peripheral vascular disease (HCC)    Bilateral femoral bruit  . Plantar fasciitis   . Polymyalgia rheumatica ([place])   . Rotator cuff syndrome of left shoulder   . Scoliosis   . Shoulder pain     Past Surgical History:  Procedure Laterality Date  . ABDOMINAL AORTIC ENDOVASCULAR STENT GRAFT N/A 08/16/2017   Procedure: ABDOMINAL AORTIC ENDOVASCULAR STENT GRAFT insertion;  Surgeon: Serafina Mitchell, MD;  Location: [facility] OR;  Service: Vascular;  Laterality: N/A;  . Actinic keratosis removal  06/21/2010   Left shoulder; Dr. Sarajane Jews  . COLONOSCOPY W/ POLYPECTOMY    . DUPUYTREN CONTRACTURE RELEASE Right 12/05/2013   Procedure: DUPUYTREN CONTRACTURE RELEASE RIGHT LONG, RING AND SMALL FINGERS;  Surgeon: Cammie Sickle., MD;  Location: [facility];  Service: Orthopedics;  Laterality: Right;  . Implant penile pump  2001  . Resection of appendix and tip of rectum  February 2005  . STAPEDECTOMY Bilateral 1985, [place]  reports that he has never smoked. He has never used smokeless tobacco. He reports that he does not drink alcohol or use drugs.  family history includes Emphysema in his mother; Heart disease in his mother.  Allergies  Allergen Reactions  . Codeine Nausea And Vomiting  . Morphine And Related Nausea And Vomiting       PHYSICAL EXAMINATION: Vital signs: BP  102/74   Pulse 89   Ht 6' (1.829 m)   Wt 185 lb (83.9 kg)   BMI 25.09 kg/m   Constitutional: generally well-appearing, no acute distress Psychiatric: alert and oriented x3, cooperative Eyes: extraocular movements intact, anicteric, conjunctiva pink Mouth: oral pharynx moist, no lesions Neck: supple no lymphadenopathy Cardiovascular: heart regular rate and rhythm Lungs: clear to auscultation bilaterally Abdomen: soft, nontender, nondistended, no obvious ascites, no peritoneal signs, normal bowel sounds, no  organomegaly Rectal: Deferred until colonoscopy Extremities: no lower extremity edema bilaterally Skin: no lesions on visible extremities Neuro: No focal deficits.  Cranial nerves intact  ASSESSMENT:  1.  History of advanced right-sided neoplasia requiring cecectomy 2005 2.  Last colonoscopy May 2011 with diminutive adenomas. 3.  Recent positive Cologuard.  I am not sure why this was ordered and an 84 year old and a surveillance colonoscopy program with a history of advanced adenoma.  Cologuard is most appropriate for colon cancer screening in average risk patients of the appropriate age group 36.  Multiple medical problems.  Recent endovascular stent graft placement for aneurysmal disease of the aorta.  Stable   PLAN:  1.  I discussed with [name] the implications of positive Cologuard testing.  I also discussed that the data available is on patient's of average risk for colon cancer with positive Cologuard who subsequently underwent colonoscopy.  The fact that the patient has had advanced colorectal neoplasia and multiple adenomatous polyps puts him at high risk for recurrent advanced neoplasia.  He is beyond his surveillance interval as previously recommended.  He is however at an advanced age with multiple general medical problems, which are stable.The nature of the procedure, as well as the risks, benefits, and alternatives were carefully and thoroughly reviewed with the patient. Ample time for discussion and questions allowed. The patient understood, was satisfied, and agreed to proceed.  He is HIGH RISK given his age and comorbidities.  A copy of this consultation note has been sent to Dr. Joylene Draft

## 2018-09-11 NOTE — Patient Instructions (Signed)

## 2018-09-12 DIAGNOSIS — C44329 Squamous cell carcinoma of skin of other parts of face: Secondary | ICD-10-CM | POA: Diagnosis not present

## 2018-09-12 DIAGNOSIS — Z85828 Personal history of other malignant neoplasm of skin: Secondary | ICD-10-CM | POA: Diagnosis not present

## 2018-09-17 ENCOUNTER — Ambulatory Visit: Payer: Medicare Other | Admitting: Surgery

## 2018-09-17 ENCOUNTER — Telehealth: Payer: Self-pay | Admitting: Internal Medicine

## 2018-09-17 MED ORDER — NA SULFATE-K SULFATE-MG SULF 17.5-3.13-1.6 GM/177ML PO SOLN: 1.0000 | Freq: Once | ORAL | 0 refills | Status: AC

## 2018-09-17 NOTE — Telephone Encounter (Signed)
Suprep sent to [employer]

## 2018-09-24 ENCOUNTER — Ambulatory Visit (INDEPENDENT_AMBULATORY_CARE_PROVIDER_SITE_OTHER): Payer: Medicare Other | Admitting: Surgery

## 2018-09-24 ENCOUNTER — Encounter: Payer: Self-pay | Admitting: Surgery

## 2018-09-24 ENCOUNTER — Other Ambulatory Visit: Payer: Self-pay

## 2018-09-24 VITALS — BP 134/76 | HR 59 | Resp 18 | Ht 72.0 in | Wt 186.5 lb

## 2018-09-24 DIAGNOSIS — I714 Abdominal aortic aneurysm, without rupture, unspecified: Secondary | ICD-10-CM

## 2018-09-24 NOTE — Progress Notes (Signed)
[facility] [place]  Patient name: [name] MRN: [id number] DOB: Jun 23, 1930 Sex: male   REASON FOR VISIT:    Follow up AAA  HISOTRY OF PRESENT ILLNESS:    [name] is a 84 y.o. male who is status post endovascular aneurysm repair on 08/16/2017.  His postoperative course was uncomplicated.  His initial postop CT scan showed a small type II endoleak.  He has no complaints today.  He denies abdominal pain.  He has had no neurologic symptoms.   PAST MEDICAL HISTORY:   Past Medical History:  Diagnosis Date  . AAA (abdominal aortic aneurysm) ([place])   . Abnormal EKG    Left atrial abnormality  . Allergic rhinitis   . Benign neoplasm of colon 04/14/10   3 small polyps on colonoscopy by Dr. Henrene Pastor  . Carpal tunnel syndrome   . Degenerative disc disease   . Disturbances metabolism of methionine, homocystine, and cystathionine (HCC)    Elevated homocysteine  . Elevated prostate specific antigen (PSA)   . Hearing loss   . Hyperlipidemia   . Impotence of organic origin    Penile implant  . Internal hemorrhoids   . Neck pain   . Otosclerosis of both ears   . Peripheral vascular disease (HCC)    Bilateral femoral bruit  . Plantar fasciitis   . Polymyalgia rheumatica ([place])   . Rotator cuff syndrome of left shoulder   . Scoliosis   . Shoulder pain      FAMILY HISTORY:   Family History  Problem Relation Age of Onset  . Heart disease Mother   . Emphysema Mother   . Colon cancer Neg Hx     SOCIAL HISTORY:   Social History   Tobacco Use  . Smoking status: Never Smoker  . Smokeless tobacco: Never Used  Substance Use Topics  . Alcohol use: No    Alcohol/week: 0.0 standard drinks     ALLERGIES:   Allergies  Allergen Reactions  . Codeine Nausea And Vomiting  . Morphine And Related Nausea And Vomiting     CURRENT MEDICATIONS:   Current Outpatient Medications  Medication Sig Dispense Refill    . acyclovir (ZOVIRAX) 400 MG tablet ONE TABLET TWICE A DAY DUE TO HERPES INFECTION 60 tablet 5  . aspirin EC 81 MG tablet Take 81 mg by mouth at bedtime.    . butalbital-acetaminophen-caffeine (FIORICET, ESGIC) 50-325-40 MG tablet Take 1-2 tablets by mouth 4 (four) times daily as needed for headache.    . fish oil-omega-3 fatty acids 1000 MG capsule Take 1,000 mg by mouth daily.     . fluorometholone (FML) 0.1 % ophthalmic suspension Place 1 drop into the right eye at bedtime.     Marland Kitchen L-Methylfolate-B12-B6-B2 (METAFOLBIC) 04-28-49-5 MG TABS TAKE ONE TABLET TWICE DAILY 60 tablet 2  . Multiple Vitamins-Minerals (MULTIVITAMIN PO) Take 1 tablet by mouth daily.     . predniSONE (DELTASONE) 5 MG tablet Take 5 mg by mouth daily. To prevent myalgias    . rosuvastatin (CRESTOR) 5 MG tablet TAKE ONE TABLET EACH DAY TO REDUCE CHOLESTEROL 90 tablet 1  . traMADol (ULTRAM) 50 MG tablet Take 1 tablet (50 mg total) by mouth every 6 (six) hours as needed for moderate pain. 10 tablet 0  . VITAMIN D, CHOLECALCIFEROL, PO Take 1 tablet by mouth daily.      No current facility-administered medications for this visit.     REVIEW OF SYSTEMS:   [X]  denotes positive  finding, [ ]  denotes negative finding Cardiac  Comments:  Chest pain or chest pressure:    Shortness of breath upon exertion:    Short of breath when lying flat:    Irregular heart rhythm:        Vascular    Pain in calf, thigh, or hip brought on by ambulation:    Pain in feet at night that wakes you up from your sleep:     Blood clot in your veins:    Leg swelling:         Pulmonary    Oxygen at home:    Productive cough:     Wheezing:         Neurologic    Sudden weakness in arms or legs:     Sudden numbness in arms or legs:     Sudden onset of difficulty speaking or slurred speech:    Temporary loss of vision in one eye:     Problems with dizziness:         Gastrointestinal    Blood in stool:     Vomited blood:         Genitourinary     Burning when urinating:     Blood in urine:        Psychiatric    Major depression:         Hematologic    Bleeding problems:    Problems with blood clotting too easily:        Skin    Rashes or ulcers:        Constitutional    Fever or chills:      PHYSICAL EXAM:   There were no vitals filed for this visit.  GENERAL: The patient is a well-nourished male, in no acute distress. The vital signs are documented above. CARDIAC: There is a regular rate and rhythm.  VASCULAR: No carotid bruits PULMONARY: Non-labored respirations ABDOMEN: Soft and non-tender with normal pitched bowel sounds.  MUSCULOSKELETAL: There are no major deformities or cyanosis. NEUROLOGIC: No focal weakness or paresthesias are detected. SKIN: There are no ulcers or rashes noted. PSYCHIATRIC: The patient has a normal affect.  STUDIES:   I have ordered and reviewed his CT angiogram with the following findings: Vascular:  Maximal diameter of the ascending aorta is 4.3 cm. Recommend annual imaging followup by CTA or MRA. This recommendation follows 2010 ACCF/AHA/AATS/ACR/ASA/SCA/SCAI/SIR/STS/SVM Guidelines for the Diagnosis and Management of Patients with Thoracic Aortic Disease. Circulation. 2010; 121: C166-A630  Maximal diameter of the aortic arch is 4.0 cm. Maximal diameter of the proximal descending thoracic aorta is 4.5 cm. Recommend semi-annual imaging followup by CTA or MRA and referral to cardiothoracic surgery if not already obtained. This recommendation follows 2010 ACCF/AHA/AATS/ACR/ASA/SCA/SCAI/SIR/STS/SVM Guidelines for the Diagnosis and Management of Patients With Thoracic Aortic Disease. Circulation. 2010; 121: e266-e36  Aorto bi-iliac stent graft is patent. Stable type 2 endoleak. Stable maximal sac diameter at 5.2 cm. Continued surveillance is warranted.  Stable findings related to fibromuscular dysplasia in the renal and iliac arteries. Iliac arteries are patent. There is  narrowing in the dominant left renal artery. This is stable.  Nonvascular:  There are chronic changes in the lung parenchyma as described most likely related to scarring and possibly chronic interstitial lung disease.  Chronic findings in the abdomen are noted above without significant change.   MEDICAL ISSUES:   AAA: There is a persistent type II endoleak, however there has been no significant change in the size of his aneurysm.  Maximum diameter is 5.2 cm.  I will monitor this again in a year with a CT scan.  Maximum diameter the proximal descending thoracic aorta is now 4.5 cm.  The a sending aorta is 4.3 cm.  I will follow these up in 1 year with a CT scan of the chest.    Annamarie Major, MD [facility] [phone] Pager [phone].

## 2018-09-26 ENCOUNTER — Ambulatory Visit (AMBULATORY_SURGERY_CENTER): Payer: Medicare Other | Admitting: Internal Medicine

## 2018-09-26 ENCOUNTER — Encounter: Payer: Self-pay | Admitting: Internal Medicine

## 2018-09-26 VITALS — BP 134/76 | HR 52 | Temp 97.7°F | Resp 12 | Ht 72.0 in | Wt 186.0 lb

## 2018-09-26 DIAGNOSIS — K635 Polyp of colon: Secondary | ICD-10-CM

## 2018-09-26 DIAGNOSIS — Z1211 Encounter for screening for malignant neoplasm of colon: Secondary | ICD-10-CM | POA: Diagnosis not present

## 2018-09-26 DIAGNOSIS — D125 Benign neoplasm of sigmoid colon: Secondary | ICD-10-CM

## 2018-09-26 DIAGNOSIS — D123 Benign neoplasm of transverse colon: Secondary | ICD-10-CM | POA: Diagnosis not present

## 2018-09-26 DIAGNOSIS — D122 Benign neoplasm of ascending colon: Secondary | ICD-10-CM

## 2018-09-26 DIAGNOSIS — Z86010 Personal history of colonic polyps: Secondary | ICD-10-CM | POA: Diagnosis not present

## 2018-09-26 DIAGNOSIS — D120 Benign neoplasm of cecum: Secondary | ICD-10-CM

## 2018-09-26 DIAGNOSIS — R195 Other fecal abnormalities: Secondary | ICD-10-CM | POA: Diagnosis not present

## 2018-09-26 MED ORDER — SODIUM CHLORIDE 0.9 % IV SOLN: 500.0000 mL | Freq: Once | INTRAVENOUS | Status: DC

## 2018-09-26 NOTE — Op Note (Signed)
[name]: [name] Procedure Date: 09/26/2018 9:54 AM MRN: [id number] Endoscopist: Docia Chuck. Henrene Pastor , MD Age: 84 Referring MD:  Date of Birth: 09-22-1930 Gender: Male Account #: [id number] Procedure:                Colonoscopy with cold snare polypectomy x 5 Indications:              Positive Cologuard test. Personal history of large                            and advanced adenoma. Last examination 2011 with                            small tubular adenomas. Medicines:                Monitored Anesthesia Care Procedure:                Pre-Anesthesia Assessment:                           - Prior to the procedure, a History and Physical                            was performed, and patient medications and                            allergies were reviewed. The patient's tolerance of                            previous anesthesia was also reviewed. The risks                            and benefits of the procedure and the sedation                            options and risks were discussed with the patient.                            All questions were answered, and informed consent                            was obtained. Prior Anticoagulants: The patient has                            taken no previous anticoagulant or antiplatelet                            agents. ASA Grade Assessment: II - A patient with                            mild systemic disease. After reviewing the risks                            and benefits, the patient was deemed in  satisfactory condition to undergo the procedure.                           After obtaining informed consent, the colonoscope                            was passed under direct vision. Throughout the                            procedure, the patient's blood pressure, pulse, and                            oxygen saturations were monitored continuously. The   Colonoscope was introduced through the anus and                            advanced to the the cecum, identified by                            appendiceal orifice and ileocecal valve. The                            terminal ileum, ileocecal valve, appendiceal                            orifice, and rectum were photographed. The quality                            of the bowel preparation was adequate to identify                            polyps. The colonoscopy was performed without                            difficulty. The patient tolerated the procedure                            well. The bowel preparation used was SUPREP. Scope In: 10:09:48 AM Scope Out: 10:37:15 AM Scope Withdrawal Time: 0 hours 22 minutes 19 seconds  Total Procedure Duration: 0 hours 27 minutes 27 seconds  Findings:                 The terminal ileum appeared normal.                           Five polyps were found in the sigmoid colon,                            transverse colon, ascending colon and cecum. The                            polyps were 2 to 3 mm in size. These polyps were                            removed  with a cold snare. Resection and retrieval                            were complete.                           Internal hemorrhoids were found during retroflexion.                           The exam was otherwise without abnormality on                            direct and retroflexion views. Complications:            No immediate complications. Estimated blood loss:                            None. Estimated Blood Loss:     Estimated blood loss: none. Impression:               - The examined portion of the ileum was normal.                           - Five 2 to 3 mm polyps in the sigmoid colon, in                            the transverse colon, in the ascending colon and in                            the cecum, removed with a cold snare. Resected and                            retrieved.                            - Internal hemorrhoids.                           - The examination was otherwise normal on direct                            and retroflexion views. Recommendation:           - Repeat colonoscopy is not recommended for                            surveillance.                           - Patient has a contact number available for                            emergencies. The signs and symptoms of potential                            delayed complications were discussed with the  patient. Return to normal activities tomorrow.                            Written discharge instructions were provided to the                            patient.                           - Resume previous diet.                           - Continue present medications.                           - Await pathology results. Docia Chuck. Henrene Pastor, MD 09/26/2018 10:46:59 AM This report has been signed electronically.

## 2018-09-26 NOTE — Progress Notes (Signed)
A and O x3. Report to RN. Tolerated MAC anesthesia well.

## 2018-09-26 NOTE — Patient Instructions (Signed)
YOU HAD AN ENDOSCOPIC PROCEDURE TODAY AT THE [facility]:   Refer to the procedure report that was given to you for any specific questions about what was found during the examination.  If the procedure report does not answer your questions, please call your gastroenterologist to clarify.  If you requested that your care partner not be given the details of your procedure findings, then the procedure report has been included in a sealed envelope for you to review at your convenience later.  YOU SHOULD EXPECT: Some feelings of bloating in the abdomen. Passage of more gas than usual.  Walking can help get rid of the air that was put into your GI tract during the procedure and reduce the bloating. If you had a lower endoscopy (such as a colonoscopy or flexible sigmoidoscopy) you may notice spotting of blood in your stool or on the toilet paper. If you underwent a bowel prep for your procedure, you may not have a normal bowel movement for a few days.  Please Note:  You might notice some irritation and congestion in your nose or some drainage.  This is from the oxygen used during your procedure.  There is no need for concern and it should clear up in a day or so.  SYMPTOMS TO REPORT IMMEDIATELY:   Following lower endoscopy (colonoscopy or flexible sigmoidoscopy):  Excessive amounts of blood in the stool  Significant tenderness or worsening of abdominal pains  Swelling of the abdomen that is new, acute  Fever of 100F or higher   For urgent or emergent issues, a gastroenterologist can be reached at any hour by calling [phone].   DIET:  We do recommend a small meal at first, but then you may proceed to your regular diet.  Drink plenty of fluids but you should avoid alcoholic beverages for 24 hours.  ACTIVITY:  You should plan to take it easy for the rest of today and you should NOT DRIVE or use heavy machinery until tomorrow (because of the sedation medicines used during the test).     FOLLOW UP: Our staff will call the number listed on your records the next business day following your procedure to check on you and address any questions or concerns that you may have regarding the information given to you following your procedure. If we do not reach you, we will leave a message.  However, if you are feeling well and you are not experiencing any problems, there is no need to return our call.  We will assume that you have returned to your regular daily activities without incident.  If any biopsies were taken you will be contacted by phone or by letter within the next 1-3 weeks.  Please call us at [phone] if you have not heard about the biopsies in 3 weeks.    SIGNATURES/CONFIDENTIALITY: You and/or your care partner have signed paperwork which will be entered into your electronic medical record.  These signatures attest to the fact that that the information above on your After Visit Summary has been reviewed and is understood.  Full responsibility of the confidentiality of this discharge information lies with you and/or your care-partner.   Thank you for allowing us to provide your healthcare today.  

## 2018-09-26 NOTE — Progress Notes (Signed)
Called to room to assist during endoscopic procedure.  Patient ID and intended procedure confirmed with present staff. Received instructions for my participation in the procedure from the performing physician.  

## 2018-09-27 ENCOUNTER — Telehealth: Payer: Self-pay | Admitting: *Deleted

## 2018-09-27 NOTE — Telephone Encounter (Signed)
  Follow up Call-  Call back number 09/26/2018  Post procedure Call Back phone  # [phone]  Permission to leave phone message Yes  Some recent data might be hidden     Patient questions:  Do you have a fever, pain , or abdominal swelling? No. Pain Score  0 *  Have you tolerated food without any problems? Yes.    Have you been able to return to your normal activities? Yes.    Do you have any questions about your discharge instructions: Diet   No. Medications  No. Follow up visit  No.  Do you have questions or concerns about your Care? No.  Actions: * If pain score is 4 or above: No action needed, pain <4.

## 2018-09-28 ENCOUNTER — Encounter: Payer: Self-pay | Admitting: Internal Medicine

## 2018-10-09 DIAGNOSIS — L570 Actinic keratosis: Secondary | ICD-10-CM | POA: Diagnosis not present

## 2018-10-09 DIAGNOSIS — Z85828 Personal history of other malignant neoplasm of skin: Secondary | ICD-10-CM | POA: Diagnosis not present

## 2018-12-17 DIAGNOSIS — L570 Actinic keratosis: Secondary | ICD-10-CM | POA: Diagnosis not present

## 2018-12-17 DIAGNOSIS — D485 Neoplasm of uncertain behavior of skin: Secondary | ICD-10-CM | POA: Diagnosis not present

## 2018-12-17 DIAGNOSIS — L821 Other seborrheic keratosis: Secondary | ICD-10-CM | POA: Diagnosis not present

## 2018-12-17 DIAGNOSIS — D225 Melanocytic nevi of trunk: Secondary | ICD-10-CM | POA: Diagnosis not present

## 2018-12-17 DIAGNOSIS — B079 Viral wart, unspecified: Secondary | ICD-10-CM | POA: Diagnosis not present

## 2018-12-17 DIAGNOSIS — Z85828 Personal history of other malignant neoplasm of skin: Secondary | ICD-10-CM | POA: Diagnosis not present

## 2019-01-23 DIAGNOSIS — M6530 Trigger finger, unspecified finger: Secondary | ICD-10-CM | POA: Diagnosis not present

## 2019-01-23 DIAGNOSIS — M79641 Pain in right hand: Secondary | ICD-10-CM | POA: Diagnosis not present

## 2019-01-23 DIAGNOSIS — M79642 Pain in left hand: Secondary | ICD-10-CM | POA: Diagnosis not present

## 2019-01-23 DIAGNOSIS — M720 Palmar fascial fibromatosis [Dupuytren]: Secondary | ICD-10-CM | POA: Diagnosis not present

## 2019-01-23 DIAGNOSIS — M65352 Trigger finger, left little finger: Secondary | ICD-10-CM | POA: Diagnosis not present

## 2019-01-25 DIAGNOSIS — B309 Viral conjunctivitis, unspecified: Secondary | ICD-10-CM | POA: Diagnosis not present

## 2019-01-25 DIAGNOSIS — H1789 Other corneal scars and opacities: Secondary | ICD-10-CM | POA: Diagnosis not present

## 2019-01-25 DIAGNOSIS — H2512 Age-related nuclear cataract, left eye: Secondary | ICD-10-CM | POA: Diagnosis not present

## 2019-01-25 DIAGNOSIS — Z9841 Cataract extraction status, right eye: Secondary | ICD-10-CM | POA: Diagnosis not present

## 2019-01-25 DIAGNOSIS — Z961 Presence of intraocular lens: Secondary | ICD-10-CM | POA: Diagnosis not present

## 2019-02-14 DIAGNOSIS — L821 Other seborrheic keratosis: Secondary | ICD-10-CM | POA: Diagnosis not present

## 2019-02-14 DIAGNOSIS — Z85828 Personal history of other malignant neoplasm of skin: Secondary | ICD-10-CM | POA: Diagnosis not present

## 2019-07-01 DIAGNOSIS — R82998 Other abnormal findings in urine: Secondary | ICD-10-CM | POA: Diagnosis not present

## 2019-07-01 DIAGNOSIS — Z125 Encounter for screening for malignant neoplasm of prostate: Secondary | ICD-10-CM | POA: Diagnosis not present

## 2019-07-01 DIAGNOSIS — E7849 Other hyperlipidemia: Secondary | ICD-10-CM | POA: Diagnosis not present

## 2019-07-11 DIAGNOSIS — R51 Headache: Secondary | ICD-10-CM | POA: Diagnosis not present

## 2019-07-11 DIAGNOSIS — Z1331 Encounter for screening for depression: Secondary | ICD-10-CM | POA: Diagnosis not present

## 2019-07-11 DIAGNOSIS — E785 Hyperlipidemia, unspecified: Secondary | ICD-10-CM | POA: Diagnosis not present

## 2019-07-11 DIAGNOSIS — Z0000 Encounter for general adult medical examination without abnormal findings: Secondary | ICD-10-CM | POA: Diagnosis not present

## 2019-07-11 DIAGNOSIS — R972 Elevated prostate specific antigen [PSA]: Secondary | ICD-10-CM | POA: Diagnosis not present

## 2019-07-11 DIAGNOSIS — J8410 Pulmonary fibrosis, unspecified: Secondary | ICD-10-CM | POA: Diagnosis not present

## 2019-07-11 DIAGNOSIS — I714 Abdominal aortic aneurysm, without rupture: Secondary | ICD-10-CM | POA: Diagnosis not present

## 2019-07-11 DIAGNOSIS — R351 Nocturia: Secondary | ICD-10-CM | POA: Diagnosis not present

## 2019-07-11 DIAGNOSIS — R5383 Other fatigue: Secondary | ICD-10-CM | POA: Diagnosis not present

## 2019-07-11 DIAGNOSIS — N183 Chronic kidney disease, stage 3 (moderate): Secondary | ICD-10-CM | POA: Diagnosis not present

## 2019-07-11 DIAGNOSIS — M353 Polymyalgia rheumatica: Secondary | ICD-10-CM | POA: Diagnosis not present

## 2019-07-11 DIAGNOSIS — B009 Herpesviral infection, unspecified: Secondary | ICD-10-CM | POA: Diagnosis not present

## 2019-07-12 DIAGNOSIS — L570 Actinic keratosis: Secondary | ICD-10-CM | POA: Diagnosis not present

## 2019-07-12 DIAGNOSIS — L910 Hypertrophic scar: Secondary | ICD-10-CM | POA: Diagnosis not present

## 2019-07-12 DIAGNOSIS — L821 Other seborrheic keratosis: Secondary | ICD-10-CM | POA: Diagnosis not present

## 2019-07-12 DIAGNOSIS — Z85828 Personal history of other malignant neoplasm of skin: Secondary | ICD-10-CM | POA: Diagnosis not present

## 2019-07-12 DIAGNOSIS — L812 Freckles: Secondary | ICD-10-CM | POA: Diagnosis not present

## 2019-07-22 DIAGNOSIS — R351 Nocturia: Secondary | ICD-10-CM | POA: Diagnosis not present

## 2019-07-22 DIAGNOSIS — N401 Enlarged prostate with lower urinary tract symptoms: Secondary | ICD-10-CM | POA: Diagnosis not present

## 2019-07-22 DIAGNOSIS — N5201 Erectile dysfunction due to arterial insufficiency: Secondary | ICD-10-CM | POA: Diagnosis not present

## 2019-07-26 DIAGNOSIS — M20012 Mallet finger of left finger(s): Secondary | ICD-10-CM | POA: Diagnosis not present

## 2019-07-29 DIAGNOSIS — M20012 Mallet finger of left finger(s): Secondary | ICD-10-CM | POA: Diagnosis not present

## 2019-08-06 DIAGNOSIS — M20012 Mallet finger of left finger(s): Secondary | ICD-10-CM | POA: Diagnosis not present

## 2019-08-08 DIAGNOSIS — M20012 Mallet finger of left finger(s): Secondary | ICD-10-CM | POA: Diagnosis not present

## 2019-08-13 DIAGNOSIS — M20012 Mallet finger of left finger(s): Secondary | ICD-10-CM | POA: Diagnosis not present

## 2019-08-20 DIAGNOSIS — M20012 Mallet finger of left finger(s): Secondary | ICD-10-CM | POA: Diagnosis not present

## 2019-08-26 DIAGNOSIS — Z961 Presence of intraocular lens: Secondary | ICD-10-CM | POA: Diagnosis not present

## 2019-08-26 DIAGNOSIS — H179 Unspecified corneal scar and opacity: Secondary | ICD-10-CM | POA: Diagnosis not present

## 2019-08-26 DIAGNOSIS — H2512 Age-related nuclear cataract, left eye: Secondary | ICD-10-CM | POA: Diagnosis not present

## 2019-08-27 DIAGNOSIS — M20012 Mallet finger of left finger(s): Secondary | ICD-10-CM | POA: Diagnosis not present

## 2019-08-31 DIAGNOSIS — Z23 Encounter for immunization: Secondary | ICD-10-CM | POA: Diagnosis not present

## 2019-09-05 DIAGNOSIS — M20012 Mallet finger of left finger(s): Secondary | ICD-10-CM | POA: Diagnosis not present

## 2019-09-06 DIAGNOSIS — M20012 Mallet finger of left finger(s): Secondary | ICD-10-CM | POA: Diagnosis not present

## 2019-09-18 DIAGNOSIS — M20012 Mallet finger of left finger(s): Secondary | ICD-10-CM | POA: Diagnosis not present

## 2019-09-19 DIAGNOSIS — M20012 Mallet finger of left finger(s): Secondary | ICD-10-CM | POA: Diagnosis not present

## 2019-09-25 DIAGNOSIS — M20012 Mallet finger of left finger(s): Secondary | ICD-10-CM | POA: Diagnosis not present

## 2019-10-01 ENCOUNTER — Encounter: Payer: Self-pay | Admitting: Surgery

## 2019-10-14 DIAGNOSIS — M20012 Mallet finger of left finger(s): Secondary | ICD-10-CM | POA: Diagnosis not present

## 2019-10-21 DIAGNOSIS — Z7952 Long term (current) use of systemic steroids: Secondary | ICD-10-CM | POA: Diagnosis not present

## 2019-11-14 ENCOUNTER — Other Ambulatory Visit: Payer: Self-pay | Admitting: Surgery

## 2019-11-14 DIAGNOSIS — I714 Abdominal aortic aneurysm, without rupture, unspecified: Secondary | ICD-10-CM

## 2019-12-10 ENCOUNTER — Ambulatory Visit: Payer: Medicare Other | Attending: Internal Medicine

## 2019-12-10 ENCOUNTER — Other Ambulatory Visit: Payer: Self-pay | Admitting: Surgery

## 2019-12-10 DIAGNOSIS — I714 Abdominal aortic aneurysm, without rupture, unspecified: Secondary | ICD-10-CM

## 2019-12-10 DIAGNOSIS — Z23 Encounter for immunization: Secondary | ICD-10-CM | POA: Insufficient documentation

## 2019-12-10 NOTE — Progress Notes (Signed)
   Covid-19 Vaccination Clinic  Name:  ALDINE GORNIK    MRN: [id number] DOB: December 28, 1929  12/10/2019  Mr. Keyton was observed post Covid-19 immunization for 15 minutes without incidence. He was provided with Vaccine Information Sheet and instruction to access the V-Safe system.   Mr. Connerton was instructed to call 911 with any severe reactions post vaccine: Marland Kitchen Difficulty breathing  . Swelling of your face and throat  . A fast heartbeat  . A bad rash all over your body  . Dizziness and weakness    Immunizations Administered    Name Date Dose VIS Date Route   Pfizer COVID-19 Vaccine 12/10/2019 11:52 AM 0.3 mL 11/08/2019 Intramuscular   Manufacturer: [place]   Lot: [id number]   [place]: [id number]

## 2019-12-12 ENCOUNTER — Other Ambulatory Visit: Payer: Self-pay

## 2019-12-12 ENCOUNTER — Ambulatory Visit
Admission: RE | Admit: 2019-12-12 | Discharge: 2019-12-12 | Disposition: A | Discharge status: Home / Self Care | Payer: Medicare Other | Source: Ambulatory Visit | Attending: Surgery | Admitting: Surgery

## 2019-12-12 DIAGNOSIS — I714 Abdominal aortic aneurysm, without rupture, unspecified: Secondary | ICD-10-CM

## 2019-12-12 DIAGNOSIS — I712 Thoracic aortic aneurysm, without rupture: Secondary | ICD-10-CM | POA: Diagnosis not present

## 2019-12-12 MED ORDER — IOPAMIDOL (ISOVUE-370) INJECTION 76%
75.0000 mL | Freq: Once | INTRAVENOUS | Status: AC | PRN
  Administered 2019-12-12: 10:00:00 75 mL via INTRAVENOUS

## 2019-12-16 ENCOUNTER — Ambulatory Visit (INDEPENDENT_AMBULATORY_CARE_PROVIDER_SITE_OTHER): Payer: Medicare Other | Admitting: Surgery

## 2019-12-16 ENCOUNTER — Other Ambulatory Visit: Payer: Self-pay

## 2019-12-16 ENCOUNTER — Encounter: Payer: Self-pay | Admitting: Surgery

## 2019-12-16 DIAGNOSIS — I714 Abdominal aortic aneurysm, without rupture, unspecified: Secondary | ICD-10-CM

## 2019-12-16 DIAGNOSIS — I712 Thoracic aortic aneurysm, without rupture, unspecified: Secondary | ICD-10-CM

## 2019-12-16 NOTE — Progress Notes (Signed)
[facility]  Patient name: [name] MRN: [id number] DOB: [birth year] Sex: male      Virtual Visit via Telephone Note   This visit type was conducted due to national recommendations for restrictions regarding the COVID-19 Pandemic (e.g. social distancing) in an effort to limit this patient's exposure and mitigate transmission in our community.  Due to his co-morbid illnesses, this patient is at least at moderate risk for complications without adequate follow up.  This format is felt to be most appropriate for this patient at this time.  The patient did not have access to video technology/had technical difficulties with video requiring transitioning to audio format only (telephone).  All issues noted in this document were discussed and addressed.  No physical exam could be performed with this format.  I did conect with Facetime for video call Patient Location: Home Provider Location: Office   REASON FOR APPOINTMENT:    Follow up  HISTORY OF PRESENT ILLNESS:   [name] is a 86 y.o. male, who is status post endovascular aneurysm repair of a 5.2 cm infrarenal abdominal aortic aneurysm on 08/16/2017.  His postoperative course was uncomplicated.  He is back for follow-up via CT scan.  He continues to take a statin and a aspirin.  He is a non-smoker.     PAST MEDICAL HISTORY    Past Medical History:  Diagnosis Date  . AAA (abdominal aortic aneurysm) ([place])   . Abnormal EKG    Left atrial abnormality  . Allergic rhinitis   . Allergy   . Benign neoplasm of colon 04/14/10   3 small polyps on colonoscopy by Dr. Henrene Pastor  . Carpal tunnel syndrome   . Cataract   . Degenerative disc disease   . Disturbances metabolism of methionine, homocystine, and cystathionine (HCC)    Elevated homocysteine  . Elevated prostate specific antigen (PSA)   . Hearing loss   . Hyperlipidemia   . Impotence of organic origin    Penile implant  . Internal  hemorrhoids   . Neck pain   . Otosclerosis of both ears   . Peripheral vascular disease (HCC)    Bilateral femoral bruit  . Plantar fasciitis   . Polymyalgia rheumatica ([place])   . Rotator cuff syndrome of left shoulder   . Scoliosis   . Shoulder pain      FAMILY HISTORY   Family History  Problem Relation Age of Onset  . Heart disease Mother   . Emphysema Mother   . Colon cancer Neg Hx   . Esophageal cancer Neg Hx   . Rectal cancer Neg Hx   . Stomach cancer Neg Hx     SOCIAL HISTORY:   Social History   Socioeconomic History  . Marital status: Married    Spouse name: Aadhvik Grady  . Number of children: 2  . Years of education: Not on file  . Highest education level: Not on file  Occupational History  . Occupation: Physicist, medical: [employer]  Tobacco Use  . Smoking status: Never Smoker  . Smokeless tobacco: Never Used  Substance and Sexual Activity  . Alcohol use: No    Alcohol/week: 0.0 standard drinks  . Drug use: No  . Sexual activity: Not Currently  Other Topics Concern  . 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   Social Determinants of Health   Financial Resource Strain:   . Difficulty of Paying Living Expenses: Not on file  Food Insecurity:   . Worried About Charity fundraiser in the Last Year: Not on file  . Ran Out of Food in the Last Year: Not on file  Transportation Needs:   . Lack of Transportation (Medical): Not on file  . Lack of Transportation (Non-Medical): Not on file  Physical Activity:   . Days of Exercise per Week: Not on file  . Minutes of Exercise per Session: Not on file  Stress:   . Feeling of Stress : Not on file  Social Connections:   . Frequency of Communication with Friends and Family: Not on file  . Frequency of Social Gatherings with Friends and Family: Not on file  . Attends Religious Services: Not on  file  . Active Member of Clubs or Organizations: Not on file  . Attends Archivist Meetings: Not on file  . Marital Status: Not on file  Intimate Partner Violence:   . Fear of Current or Ex-Partner: Not on file  . Emotionally Abused: Not on file  . Physically Abused: Not on file  . Sexually Abused: Not on file    ALLERGIES:    Allergies  Allergen Reactions  . Codeine Nausea And Vomiting  . Morphine And Related Nausea And Vomiting    CURRENT MEDICATIONS:    Current Outpatient Medications  Medication Sig Dispense Refill  . acyclovir (ZOVIRAX) 400 MG tablet ONE TABLET TWICE A DAY DUE TO HERPES INFECTION 60 tablet 5  . aspirin EC 81 MG tablet Take 81 mg by mouth at bedtime.    . butalbital-acetaminophen-caffeine (FIORICET, ESGIC) 50-325-40 MG tablet Take 1-2 tablets by mouth 4 (four) times daily as needed for headache.    . fish oil-omega-3 fatty acids 1000 MG capsule Take 1,000 mg by mouth daily.     . fluorometholone (FML) 0.1 % ophthalmic suspension Place 1 drop into the right eye at bedtime.     Marland Kitchen L-Methylfolate-B12-B6-B2 (METAFOLBIC) 04-28-49-5 MG TABS TAKE ONE TABLET TWICE DAILY 60 tablet 2  . Multiple Vitamins-Minerals (MULTIVITAMIN PO) Take 1 tablet by mouth daily.     . predniSONE (DELTASONE) 5 MG tablet Take 5 mg by mouth daily. To prevent myalgias    . rosuvastatin (CRESTOR) 5 MG tablet TAKE ONE TABLET EACH DAY TO REDUCE CHOLESTEROL 90 tablet 1  . traMADol (ULTRAM) 50 MG tablet Take 1 tablet (50 mg total) by mouth every 6 (six) hours as needed for moderate pain. 10 tablet 0  . VITAMIN D, CHOLECALCIFEROL, PO Take 1 tablet by mouth daily.      Current Facility-Administered Medications  Medication Dose Route Frequency Provider Last Rate Last Admin  . 0.9 %  sodium chloride infusion  500 mL Intravenous Once Irene Shipper, MD        REVIEW OF SYSTEMS:   Please see the history of present illness.     All other systems reviewed and are negative.  PHYSICAL EXAM:    There were no vitals filed for this visit. Not performed due to Covid  Recent Labs: No results found for requested labs within last 8760 hours.   Recent Lipid Panel Lab Results  Component Value Date/Time   CHOL 148 09/26/2016 08:04 AM   CHOL 138 03/21/2016 08:19 AM   TRIG 71 09/26/2016 08:04 AM   HDL 52 09/26/2016 08:04 AM   HDL  56 03/21/2016 08:19 AM   CHOLHDL 2.8 09/26/2016 08:04 AM   LDLCALC 82 09/26/2016 08:04 AM   LDLCALC 68 03/21/2016 08:19 AM    Wt Readings from Last 3 Encounters:  09/26/18 186 lb (84.4 kg)  09/24/18 186 lb 8 oz (84.6 kg)  09/11/18 185 lb (83.9 kg)   STUDIES:   I have reviewed his CT scan with the following findings: CTA CHEST  1. Stable to incrementally enlarged fusiform aneurysmal dilation of the ascending thoracic aorta with a maximal transverse diameter of 4.4 cm (4.3 cm previously). Recommend annual imaging followup by CTA or MRA. This recommendation follows 2010 ACCF/AHA/AATS/ACR/ASA/SCA/SCAI/SIR/STS/SVM Guidelines for the Diagnosis and Management of Patients with Thoracic Aortic Disease. Circulation. 2010; [id number]. Aortic aneurysm NOS (ICD10-I71.9). 2. Coronary artery calcifications. 3. Stable dependent and peripheral lower lung architectural changes and chronic micro nodularity.  CTA ABD/PELVIS  1. Abdominal aortic aneurysm status post endovascular aortic repair with persistent delayed type 2 endoleak. There has been interval enlargement of the excluded aneurysm sac which now measures 5.6 x 5.3 cm compared to 5.2 x 4.9 cm in September of 2019. 2. Changes of fibromuscular dysplasia involving the dual left-sided renal arteries and both internal and external iliac arteries. 3. Retrocecal appendix noted incidentally. 4. Additional ancillary findings as above without significant interval change.  ASSESSMENT and PLAN   AAA: We discussed the recent increase in size of his aneurysm with maximum diameter 5.6 cm and persistent  type II endoleak.  I have elected to repeat his CT scan in 6 months.  If it has increased in size I will consider referral to interventional radiology  TAAA: Slight increase in diameter.  I will evaluate this at his next scan.    Time:   Today, I have spent 16 minutes with the patient with telehealth technology discussing the above problems.     Leia Alf, MD, FACS [facility] [phone] Pager [phone]

## 2019-12-27 ENCOUNTER — Ambulatory Visit: Payer: Medicare Other

## 2019-12-28 ENCOUNTER — Ambulatory Visit: Payer: Medicare Other | Attending: Internal Medicine

## 2019-12-28 DIAGNOSIS — Z23 Encounter for immunization: Secondary | ICD-10-CM

## 2019-12-28 NOTE — Progress Notes (Signed)
   Covid-19 Vaccination Clinic  Name:  [name]    MRN: [id number] DOB: 03/12/1930  12/28/2019  [name] was observed post Covid-19 immunization for 15 minutes without incidence. He was provided with Vaccine Information Sheet and instruction to access the V-Safe system.   [name] was instructed to call 911 with any severe reactions post vaccine: Marland Kitchen Difficulty breathing  . Swelling of your face and throat  . A fast heartbeat  . A bad rash all over your body  . Dizziness and weakness    Immunizations Administered    Name Date Dose VIS Date Route   Pfizer COVID-19 Vaccine 12/28/2019 12:01 PM 0.3 mL 11/08/2019 Intramuscular   Manufacturer: [place]   Lot: [id number]   [place]: [id number]

## 2020-01-13 DIAGNOSIS — I77810 Thoracic aortic ectasia: Secondary | ICD-10-CM | POA: Diagnosis not present

## 2020-01-13 DIAGNOSIS — R972 Elevated prostate specific antigen [PSA]: Secondary | ICD-10-CM | POA: Diagnosis not present

## 2020-01-13 DIAGNOSIS — N183 Chronic kidney disease, stage 3 unspecified: Secondary | ICD-10-CM | POA: Diagnosis not present

## 2020-01-13 DIAGNOSIS — I714 Abdominal aortic aneurysm, without rupture: Secondary | ICD-10-CM | POA: Diagnosis not present

## 2020-01-13 DIAGNOSIS — J8410 Pulmonary fibrosis, unspecified: Secondary | ICD-10-CM | POA: Diagnosis not present

## 2020-01-13 DIAGNOSIS — M353 Polymyalgia rheumatica: Secondary | ICD-10-CM | POA: Diagnosis not present

## 2020-01-13 DIAGNOSIS — R519 Headache, unspecified: Secondary | ICD-10-CM | POA: Diagnosis not present

## 2020-01-13 DIAGNOSIS — E785 Hyperlipidemia, unspecified: Secondary | ICD-10-CM | POA: Diagnosis not present

## 2020-01-23 DIAGNOSIS — L821 Other seborrheic keratosis: Secondary | ICD-10-CM | POA: Diagnosis not present

## 2020-01-23 DIAGNOSIS — Z85828 Personal history of other malignant neoplasm of skin: Secondary | ICD-10-CM | POA: Diagnosis not present

## 2020-01-23 DIAGNOSIS — L570 Actinic keratosis: Secondary | ICD-10-CM | POA: Diagnosis not present

## 2020-01-23 DIAGNOSIS — D1801 Hemangioma of skin and subcutaneous tissue: Secondary | ICD-10-CM | POA: Diagnosis not present

## 2020-01-23 DIAGNOSIS — L812 Freckles: Secondary | ICD-10-CM | POA: Diagnosis not present

## 2020-02-17 DIAGNOSIS — Z961 Presence of intraocular lens: Secondary | ICD-10-CM | POA: Diagnosis not present

## 2020-02-17 DIAGNOSIS — H179 Unspecified corneal scar and opacity: Secondary | ICD-10-CM | POA: Diagnosis not present

## 2020-02-17 DIAGNOSIS — H2512 Age-related nuclear cataract, left eye: Secondary | ICD-10-CM | POA: Diagnosis not present

## 2020-02-25 DIAGNOSIS — L03114 Cellulitis of left upper limb: Secondary | ICD-10-CM | POA: Diagnosis not present

## 2020-02-25 DIAGNOSIS — S51812A Laceration without foreign body of left forearm, initial encounter: Secondary | ICD-10-CM | POA: Diagnosis not present

## 2020-03-09 DIAGNOSIS — R3914 Feeling of incomplete bladder emptying: Secondary | ICD-10-CM | POA: Diagnosis not present

## 2020-03-09 DIAGNOSIS — R351 Nocturia: Secondary | ICD-10-CM | POA: Diagnosis not present

## 2020-03-09 DIAGNOSIS — N401 Enlarged prostate with lower urinary tract symptoms: Secondary | ICD-10-CM | POA: Diagnosis not present

## 2020-03-10 DIAGNOSIS — M542 Cervicalgia: Secondary | ICD-10-CM | POA: Diagnosis not present

## 2020-03-10 DIAGNOSIS — M25511 Pain in right shoulder: Secondary | ICD-10-CM | POA: Diagnosis not present

## 2020-03-10 DIAGNOSIS — M4722 Other spondylosis with radiculopathy, cervical region: Secondary | ICD-10-CM | POA: Diagnosis not present

## 2020-03-20 DIAGNOSIS — H2512 Age-related nuclear cataract, left eye: Secondary | ICD-10-CM | POA: Diagnosis not present

## 2020-03-20 DIAGNOSIS — H179 Unspecified corneal scar and opacity: Secondary | ICD-10-CM | POA: Diagnosis not present

## 2020-03-20 DIAGNOSIS — Z961 Presence of intraocular lens: Secondary | ICD-10-CM | POA: Diagnosis not present

## 2020-03-20 DIAGNOSIS — Z9841 Cataract extraction status, right eye: Secondary | ICD-10-CM | POA: Diagnosis not present

## 2020-03-23 DIAGNOSIS — N401 Enlarged prostate with lower urinary tract symptoms: Secondary | ICD-10-CM | POA: Diagnosis not present

## 2020-03-23 DIAGNOSIS — R351 Nocturia: Secondary | ICD-10-CM | POA: Diagnosis not present

## 2020-03-23 DIAGNOSIS — R3914 Feeling of incomplete bladder emptying: Secondary | ICD-10-CM | POA: Diagnosis not present

## 2020-06-10 ENCOUNTER — Other Ambulatory Visit: Payer: Self-pay

## 2020-06-10 DIAGNOSIS — I712 Thoracic aortic aneurysm, without rupture, unspecified: Secondary | ICD-10-CM

## 2020-06-17 DIAGNOSIS — H04552 Acquired stenosis of left nasolacrimal duct: Secondary | ICD-10-CM | POA: Diagnosis not present

## 2020-06-17 DIAGNOSIS — Z9841 Cataract extraction status, right eye: Secondary | ICD-10-CM | POA: Diagnosis not present

## 2020-06-17 DIAGNOSIS — Z961 Presence of intraocular lens: Secondary | ICD-10-CM | POA: Diagnosis not present

## 2020-06-17 DIAGNOSIS — H2512 Age-related nuclear cataract, left eye: Secondary | ICD-10-CM | POA: Diagnosis not present

## 2020-06-17 DIAGNOSIS — H179 Unspecified corneal scar and opacity: Secondary | ICD-10-CM | POA: Diagnosis not present

## 2020-06-17 DIAGNOSIS — H04562 Stenosis of left lacrimal punctum: Secondary | ICD-10-CM | POA: Diagnosis not present

## 2020-06-19 DIAGNOSIS — H6123 Impacted cerumen, bilateral: Secondary | ICD-10-CM | POA: Diagnosis not present

## 2020-06-19 DIAGNOSIS — H900 Conductive hearing loss, bilateral: Secondary | ICD-10-CM | POA: Diagnosis not present

## 2020-06-25 DIAGNOSIS — H838X3 Other specified diseases of inner ear, bilateral: Secondary | ICD-10-CM | POA: Diagnosis not present

## 2020-06-25 DIAGNOSIS — H6123 Impacted cerumen, bilateral: Secondary | ICD-10-CM | POA: Diagnosis not present

## 2020-06-25 DIAGNOSIS — H903 Sensorineural hearing loss, bilateral: Secondary | ICD-10-CM | POA: Diagnosis not present

## 2020-06-30 ENCOUNTER — Ambulatory Visit
Admission: RE | Admit: 2020-06-30 | Discharge: 2020-06-30 | Disposition: A | Discharge status: Home / Self Care | Payer: Medicare Other | Source: Ambulatory Visit | Attending: Surgery | Admitting: Surgery

## 2020-06-30 ENCOUNTER — Other Ambulatory Visit: Payer: Self-pay

## 2020-06-30 DIAGNOSIS — I714 Abdominal aortic aneurysm, without rupture: Secondary | ICD-10-CM | POA: Diagnosis not present

## 2020-06-30 DIAGNOSIS — I712 Thoracic aortic aneurysm, without rupture, unspecified: Secondary | ICD-10-CM

## 2020-06-30 MED ORDER — IOPAMIDOL (ISOVUE-370) INJECTION 76%
75.0000 mL | Freq: Once | INTRAVENOUS | Status: AC | PRN
  Administered 2020-06-30: 75 mL via INTRAVENOUS

## 2020-07-06 ENCOUNTER — Other Ambulatory Visit: Payer: Self-pay

## 2020-07-06 ENCOUNTER — Ambulatory Visit (INDEPENDENT_AMBULATORY_CARE_PROVIDER_SITE_OTHER): Payer: Medicare Other | Admitting: Surgery

## 2020-07-06 ENCOUNTER — Encounter: Payer: Self-pay | Admitting: Surgery

## 2020-07-06 VITALS — BP 144/76 | HR 72 | Temp 97.7°F | Resp 20 | Ht 72.0 in | Wt 184.0 lb

## 2020-07-06 DIAGNOSIS — I714 Abdominal aortic aneurysm, without rupture, unspecified: Secondary | ICD-10-CM

## 2020-07-06 NOTE — Progress Notes (Signed)
[facility] [place]  Patient name: [name] MRN: [id number] DOB: 10/07/1930 Sex: male   REASON FOR VISIT:    Follow up AAA  HISOTRY OF PRESENT ILLNESS:    [name] is a 86 y.o. male who returns today for follow-up of his abdominal aortic aneurysm.  He is status post endovascular repair on 08/16/2017.  At that time maximum diameter was 5.2 cm.  In January 2021 maximum diameter of his aneurysm was 5.6 cm with a persistent type II endoleak.  He also had a 4.4 cm ascending aortic aneurysm.  He continues to take a statin for hypercholesterolemia.   PAST MEDICAL HISTORY:   Past Medical History:  Diagnosis Date  . AAA (abdominal aortic aneurysm) ([place])   . Abnormal EKG    Left atrial abnormality  . Allergic rhinitis   . Allergy   . Benign neoplasm of colon 04/14/10   3 small polyps on colonoscopy by Dr. Henrene Pastor  . Carpal tunnel syndrome   . Cataract   . Degenerative disc disease   . Disturbances metabolism of methionine, homocystine, and cystathionine (HCC)    Elevated homocysteine  . Elevated prostate specific antigen (PSA)   . Hearing loss   . Hyperlipidemia   . Impotence of organic origin    Penile implant  . Internal hemorrhoids   . Neck pain   . Otosclerosis of both ears   . Peripheral vascular disease (HCC)    Bilateral femoral bruit  . Plantar fasciitis   . Polymyalgia rheumatica ([place])   . Rotator cuff syndrome of left shoulder   . Scoliosis   . Shoulder pain      FAMILY HISTORY:   Family History  Problem Relation Age of Onset  . Heart disease Mother   . Emphysema Mother   . Colon cancer Neg Hx   . Esophageal cancer Neg Hx   . Rectal cancer Neg Hx   . Stomach cancer Neg Hx     SOCIAL HISTORY:   Social History   Tobacco Use  . Smoking status: Never Smoker  . Smokeless tobacco: Never Used  Substance Use Topics  . Alcohol use: No    Alcohol/week: 0.0 standard drinks      ALLERGIES:   Allergies  Allergen Reactions  . Amoxicillin Other (See Comments)  . Codeine Nausea And Vomiting  . Doxycycline Other (See Comments)    Upset stomach, currently taking and tolerating well    . Morphine And Related Nausea And Vomiting     CURRENT MEDICATIONS:   Current Outpatient Medications  Medication Sig Dispense Refill  . acyclovir (ZOVIRAX) 400 MG tablet ONE TABLET TWICE A DAY DUE TO HERPES INFECTION 60 tablet 5  . aspirin EC 81 MG tablet Take 81 mg by mouth at bedtime.    . butalbital-acetaminophen-caffeine (FIORICET, ESGIC) 50-325-40 MG tablet Take 1-2 tablets by mouth 4 (four) times daily as needed for headache.    . fish oil-omega-3 fatty acids 1000 MG capsule Take 1,000 mg by mouth daily.     . fluorometholone (FML) 0.1 % ophthalmic suspension Place 1 drop into the right eye at bedtime.     Marland Kitchen L-Methylfolate-B12-B6-B2 (METAFOLBIC) 04-28-49-5 MG TABS TAKE ONE TABLET TWICE DAILY 60 tablet 2  . Multiple Vitamins-Minerals (MULTIVITAMIN PO) Take 1 tablet by mouth daily.     . predniSONE (DELTASONE) 5 MG tablet Take 5 mg by mouth daily. To prevent myalgias    . rosuvastatin (CRESTOR) 5 MG tablet TAKE ONE  TABLET EACH DAY TO REDUCE CHOLESTEROL 90 tablet 1  . tamsulosin (FLOMAX) 0.4 MG CAPS capsule Take 0.8 mg by mouth at bedtime.    . traMADol (ULTRAM) 50 MG tablet Take 1 tablet (50 mg total) by mouth every 6 (six) hours as needed for moderate pain. 10 tablet 0  . VITAMIN D, CHOLECALCIFEROL, PO Take 1 tablet by mouth daily.      Current Facility-Administered Medications  Medication Dose Route Frequency Provider Last Rate Last Admin  . 0.9 %  sodium chloride infusion  500 mL Intravenous Once Irene Shipper, MD        REVIEW OF SYSTEMS:   [X]  denotes positive finding, [ ]  denotes negative finding Cardiac  Comments:  Chest pain or chest pressure:    Shortness of breath upon exertion:    Short of breath when lying flat:    Irregular heart rhythm:         Vascular    Pain in calf, thigh, or hip brought on by ambulation:    Pain in feet at night that wakes you up from your sleep:     Blood clot in your veins:    Leg swelling:         Pulmonary    Oxygen at home:    Productive cough:     Wheezing:         Neurologic    Sudden weakness in arms or legs:     Sudden numbness in arms or legs:     Sudden onset of difficulty speaking or slurred speech:    Temporary loss of vision in one eye:     Problems with dizziness:         Gastrointestinal    Blood in stool:     Vomited blood:         Genitourinary    Burning when urinating:     Blood in urine:        Psychiatric    Major depression:         Hematologic    Bleeding problems:    Problems with blood clotting too easily:        Skin    Rashes or ulcers:        Constitutional    Fever or chills:      PHYSICAL EXAM:   Vitals:   07/06/20 1003  BP: (!) 144/76  Pulse: 72  Resp: 20  Temp: 97.7 F (36.5 C)  SpO2: 97%  Weight: 184 lb (83.5 kg)  Height: 6' (1.829 m)    GENERAL: The patient is a well-nourished male, in no acute distress. The vital signs are documented above. CARDIAC: There is a regular rate and rhythm.  VASCULAR: Palpable pedal pulses PULMONARY: Non-labored respirations ABDOMEN: Soft and non-tender   MUSCULOSKELETAL: There are no major deformities or cyanosis. NEUROLOGIC: No focal weakness or paresthesias are detected. SKIN: There are no ulcers or rashes noted. PSYCHIATRIC: The patient has a normal affect.  STUDIES:   I have reviewed his CT scan with the following findings: CTA CHEST  1. Stable fusiform aneurysmal dilation of the ascending and proximal descending thoracic aorta. Maximal ascending thoracic aortic diameter 4.4 cm. Recommend annual imaging followup by CTA or MRA. This recommendation follows 2010 ACCF/AHA/AATS/ACR/ASA/SCA/SCAI/SIR/STS/SVM Guidelines for the Diagnosis and Management of Patients with Thoracic Aortic  Disease. Circulation. 2010; 121: A416-S063. Aortic aneurysm NOS (ICD10-I71.9) 2. Additional ancillary findings as above without interval change.  CTA ABD/PELVIS  1. Abdominal aortic aneurysm status post  EVAR with delayed type 2 endoleak with continued slow enlargement of the excluded aneurysm sac. The maximal sac diameter is now 5.8 x 5.4 cm compared to 5.6 x 5.3 cm in January of 2021 and 5.2 x 4.9 cm in September of 2019. 2. Additional ancillary findings as above without significant interval change. MEDICAL ISSUES:   AAA: Maximum aortic diameter has increased to 5.8 cm with a known type II endoleak.  I am making a referral to interventional radiology for possible embolization.  I have scheduled to see me back in 6 months.  He will need a chest CT scan in 1 year to evaluate his a sending aorta.    Leia Alf, MD, FACS [facility] [phone] Pager [phone]

## 2020-07-08 ENCOUNTER — Other Ambulatory Visit: Payer: Self-pay | Admitting: *Deleted

## 2020-07-08 DIAGNOSIS — I714 Abdominal aortic aneurysm, without rupture, unspecified: Secondary | ICD-10-CM

## 2020-07-13 ENCOUNTER — Other Ambulatory Visit: Payer: Self-pay | Admitting: Surgery

## 2020-07-13 DIAGNOSIS — T82330A Leakage of aortic (bifurcation) graft (replacement), initial encounter: Secondary | ICD-10-CM

## 2020-07-13 DIAGNOSIS — IMO0002 Reserved for concepts with insufficient information to code with codable children: Secondary | ICD-10-CM

## 2020-07-27 DIAGNOSIS — L570 Actinic keratosis: Secondary | ICD-10-CM | POA: Diagnosis not present

## 2020-07-27 DIAGNOSIS — L821 Other seborrheic keratosis: Secondary | ICD-10-CM | POA: Diagnosis not present

## 2020-07-27 DIAGNOSIS — Z85828 Personal history of other malignant neoplasm of skin: Secondary | ICD-10-CM | POA: Diagnosis not present

## 2020-07-30 ENCOUNTER — Ambulatory Visit
Admission: RE | Admit: 2020-07-30 | Discharge: 2020-07-30 | Disposition: A | Discharge status: Home / Self Care | Payer: Medicare Other | Source: Ambulatory Visit | Attending: Surgery | Admitting: Surgery

## 2020-07-30 ENCOUNTER — Other Ambulatory Visit: Payer: Self-pay

## 2020-07-30 DIAGNOSIS — I714 Abdominal aortic aneurysm, without rupture: Secondary | ICD-10-CM | POA: Diagnosis not present

## 2020-07-30 DIAGNOSIS — Z95828 Presence of other vascular implants and grafts: Secondary | ICD-10-CM | POA: Diagnosis not present

## 2020-07-30 DIAGNOSIS — IMO0002 Reserved for concepts with insufficient information to code with codable children: Secondary | ICD-10-CM

## 2020-07-30 DIAGNOSIS — T82330A Leakage of aortic (bifurcation) graft (replacement), initial encounter: Secondary | ICD-10-CM | POA: Diagnosis not present

## 2020-07-30 HISTORY — PX: IR RADIOLOGIST EVAL & MGMT: IMG5224

## 2020-07-30 NOTE — Consult Note (Signed)
Chief Complaint: Patient was consulted remotely today (TeleHealth) for Type 2 Endoleak at the request of Brabham,Vance W.    Referring Physician(s): Harold Barban W  History of Present Illness: [name] is a 86 y.o. male with a history of abdominal aortic aneurysm status post endovascular aortic repair with a bifurcated endoprosthesis on 08/16/2017.  At that time, the maximal diameter of the sac was 5.2 cm.  Surveillance imaging performed in January 2021 demonstrated a maximal diameter of the aneurysm sac of 5.6 cm with a small persistent type II endoleak.  More recent surveillance imaging dated 06/30/2020 shows a persistent type II endoleak and further growth of the aneurysm sac now measuring up to 5.8 cm in diameter.  He now presents at the kind request of Dr. [name] to discuss possible repair of the endoleak.  He is currently asymptomatic.  He denies abdominal or flank pain.  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  This will keep him busy until the end of September.  Past Medical History:  Diagnosis Date  . AAA (abdominal aortic aneurysm) ([place])   . Abnormal EKG    Left atrial abnormality  . Allergic rhinitis   . Allergy   . Benign neoplasm of colon 04/14/10   3 small polyps on colonoscopy by Dr. Henrene Pastor  . Carpal tunnel syndrome   . Cataract   . Degenerative disc disease   . Disturbances metabolism of methionine, homocystine, and cystathionine (HCC)    Elevated homocysteine  . Elevated prostate specific antigen (PSA)   . Hearing loss   . Hyperlipidemia   . Impotence of organic origin    Penile implant  . Internal hemorrhoids   . Neck pain   . Otosclerosis of both ears   . Peripheral vascular disease (HCC)    Bilateral femoral bruit  . Plantar fasciitis   . Polymyalgia rheumatica ([place])   . Rotator cuff syndrome of left shoulder   . Scoliosis   . Shoulder pain     Past Surgical  History:  Procedure Laterality Date  . ABDOMINAL AORTIC ENDOVASCULAR STENT GRAFT N/A 08/16/2017   Procedure: ABDOMINAL AORTIC ENDOVASCULAR STENT GRAFT insertion;  Surgeon: Serafina Mitchell, MD;  Location: [facility] OR;  Service: Vascular;  Laterality: N/A;  . Actinic keratosis removal  06/21/2010   Left shoulder; Dr. Sarajane Jews  . COLONOSCOPY    . COLONOSCOPY W/ POLYPECTOMY    . DUPUYTREN CONTRACTURE RELEASE Right 12/05/2013   Procedure: DUPUYTREN CONTRACTURE RELEASE RIGHT LONG, RING AND SMALL FINGERS;  Surgeon: Cammie Sickle., MD;  Location: [facility];  Service: Orthopedics;  Laterality: Right;  . Implant penile pump  2001  . Resection of appendix and tip of rectum  February 2005  . STAPEDECTOMY Bilateral 1985, 1988   [employer]    Allergies: Amoxicillin, Codeine, Doxycycline, and Morphine and related  Medications: Prior to Admission medications   Medication Sig Start Date End Date Taking? Authorizing Provider  acyclovir (ZOVIRAX) 400 MG tablet ONE TABLET TWICE A DAY DUE TO HERPES INFECTION 01/22/18   [name], NP  aspirin EC 81 MG tablet Take 81 mg by mouth at bedtime.    [provider]  butalbital-acetaminophen-caffeine (FIORICET, ESGIC) 50-325-40 MG tablet Take 1-2 tablets by mouth 4 (four) times daily as needed for headache.    [provider]  fish oil-omega-3 fatty acids 1000 MG capsule Take 1,000 mg  by mouth daily.     [provider]  fluorometholone (FML) 0.1 % ophthalmic suspension Place 1 drop into the right eye at bedtime.     [provider]  L-Methylfolate-B12-B6-B2 (METAFOLBIC) 04-28-49-5 MG TABS TAKE ONE TABLET TWICE DAILY 08/18/17   [name], NP  Multiple Vitamins-Minerals (MULTIVITAMIN PO) Take 1 tablet by mouth daily.     [provider]  predniSONE (DELTASONE) 5 MG tablet Take 5 mg by mouth daily. To prevent myalgias    [provider]  rosuvastatin (CRESTOR) 5 MG tablet TAKE ONE  TABLET EACH DAY TO REDUCE CHOLESTEROL 09/01/17   [name], NP  tamsulosin (FLOMAX) 0.4 MG CAPS capsule Take 0.8 mg by mouth at bedtime. 06/10/20   [provider]  traMADol (ULTRAM) 50 MG tablet Take 1 tablet (50 mg total) by mouth every 6 (six) hours as needed for moderate pain. 08/17/17   Rhyne, Hulen Shouts, PA-C  VITAMIN D, CHOLECALCIFEROL, PO Take 1 tablet by mouth daily.     [provider]     Family History  Problem Relation Age of Onset  . Heart disease Mother   . Emphysema Mother   . Colon cancer Neg Hx   . Esophageal cancer Neg Hx   . Rectal cancer Neg Hx   . Stomach cancer Neg Hx     Social History   Socioeconomic History  . Marital status: Married    Spouse name: [name]  . Number of children: 2  . Years of education: Not on file  . Highest education level: Not on file  Occupational History  . Occupation: Physicist, medical: [employer]  Tobacco Use  . Smoking status: Never Smoker  . Smokeless tobacco: Never Used  Vaping Use  . Vaping Use: Never used  Substance and Sexual Activity  . Alcohol use: No    Alcohol/week: 0.0 standard drinks  . Drug use: No  . Sexual activity: Not Currently  Other Topics Concern  . 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   Social Determinants of Health   Financial Resource Strain:   . Difficulty of Paying Living Expenses: Not on file  Food Insecurity:   . Worried About Charity fundraiser in the Last Year: Not on file  . Ran Out of Food in the Last Year: Not on file  Transportation Needs:   . Lack of Transportation (Medical): Not on file  . Lack of Transportation (Non-Medical): Not on file  Physical Activity:   . Days of Exercise per Week: Not on file  . Minutes of Exercise per Session: Not on file  Stress:   . Feeling of Stress : Not on file  Social Connections:   . Frequency  of Communication with Friends and Family: Not on file  . Frequency of Social Gatherings with Friends and Family: Not on file  . Attends Religious Services: Not on file  . Active Member of Clubs or Organizations: Not on file  . Attends Archivist Meetings: Not on file  . Marital Status: Not on file    Review of Systems  Review of Systems: A 12 point ROS discussed and pertinent positives are indicated in the HPI above.  All other systems are negative.  Physical Exam No direct physical exam was performed (except for noted visual exam findings with Video Visits).   Vital Signs:  There were no vitals taken for this visit.  Imaging: No results found.  Labs:  CBC: No results for input(s): WBC, HGB, HCT, PLT in the last 8760 hours.  COAGS: No results for input(s): INR, APTT in the last 8760 hours.  BMP: No results for input(s): NA, K, CL, CO2, GLUCOSE, BUN, CALCIUM, CREATININE, GFRNONAA, GFRAA in the last 8760 hours.  Invalid input(s): CMP  LIVER FUNCTION TESTS: No results for input(s): BILITOT, AST, ALT, ALKPHOS, PROT, ALBUMIN in the last 8760 hours.  TUMOR MARKERS: No results for input(s): AFPTM, CEA, CA199, CHROMGRNA in the last 8760 hours.  Assessment and Plan:  Very pleasant 86 year old gentleman with an abdominal aortic aneurysm status post endovascular aortic repair but complicated by a delayed type II endoleak and slow growth of the native aneurysm sac.  We discussed the natural history of endoleak's and the potential risk for rupture if there is sufficient pressure within the native aneurysm sac to result in sac growth over time.  We also discussed pursuing an attempt at endovascular endoleak repair.  This involves an arteriogram to identify the source of vessels contributing to the endoleak followed by attempted embolization of the flow channel within the aneurysm sac and embolization of the supplying vessels.  I further explained that sometimes the endovascular  approach is unsuccessful and that we do have a plan be which we could consider in the future if this were the case and that Plan B is a direct sac puncture under general anesthesia.  He and his wife both voiced their understanding and desire to proceed.  However, he is working on a very large project at the present time and would prefer to schedule something the week of October 11.  1.)  Please arrange for angiogram and attempted endovascular endoleak repair to be performed at [facility] the week of October 11.  This will be performed under moderate sedation.  Thank you for this interesting consult.  I greatly enjoyed meeting [name] and look forward to participating in their care.  A copy of this report was sent to the requesting provider on this date.  Electronically Signed: Jacqulynn Cadet 07/30/2020, 11:37 AM   I spent a total of 30 Minutes  in remote  clinical consultation, greater than 50% of which was counseling/coordinating care for AAA with type II endoleak.    Visit type: Audio and video [employer]).   Alternative for in-person consultation at [facility], [address] [place], [place], [place]. This visit type was conducted due to national recommendations for restrictions regarding the COVID-19 Pandemic (e.g. social distancing).  This format is felt to be most appropriate for this patient at this time.  All issues noted in this document were discussed and addressed.

## 2020-08-10 DIAGNOSIS — Z23 Encounter for immunization: Secondary | ICD-10-CM | POA: Diagnosis not present

## 2020-08-12 DIAGNOSIS — Z125 Encounter for screening for malignant neoplasm of prostate: Secondary | ICD-10-CM | POA: Diagnosis not present

## 2020-08-12 DIAGNOSIS — E559 Vitamin D deficiency, unspecified: Secondary | ICD-10-CM | POA: Diagnosis not present

## 2020-08-12 DIAGNOSIS — E785 Hyperlipidemia, unspecified: Secondary | ICD-10-CM | POA: Diagnosis not present

## 2020-08-19 DIAGNOSIS — J8410 Pulmonary fibrosis, unspecified: Secondary | ICD-10-CM | POA: Diagnosis not present

## 2020-08-19 DIAGNOSIS — Z1331 Encounter for screening for depression: Secondary | ICD-10-CM | POA: Diagnosis not present

## 2020-08-19 DIAGNOSIS — R972 Elevated prostate specific antigen [PSA]: Secondary | ICD-10-CM | POA: Diagnosis not present

## 2020-08-19 DIAGNOSIS — Z0000 Encounter for general adult medical examination without abnormal findings: Secondary | ICD-10-CM | POA: Diagnosis not present

## 2020-08-19 DIAGNOSIS — M353 Polymyalgia rheumatica: Secondary | ICD-10-CM | POA: Diagnosis not present

## 2020-08-19 DIAGNOSIS — R82998 Other abnormal findings in urine: Secondary | ICD-10-CM | POA: Diagnosis not present

## 2020-08-19 DIAGNOSIS — I714 Abdominal aortic aneurysm, without rupture: Secondary | ICD-10-CM | POA: Diagnosis not present

## 2020-08-19 DIAGNOSIS — I77810 Thoracic aortic ectasia: Secondary | ICD-10-CM | POA: Diagnosis not present

## 2020-08-19 DIAGNOSIS — E785 Hyperlipidemia, unspecified: Secondary | ICD-10-CM | POA: Diagnosis not present

## 2020-08-19 DIAGNOSIS — E559 Vitamin D deficiency, unspecified: Secondary | ICD-10-CM | POA: Diagnosis not present

## 2020-08-19 DIAGNOSIS — B009 Herpesviral infection, unspecified: Secondary | ICD-10-CM | POA: Diagnosis not present

## 2020-08-19 DIAGNOSIS — Z23 Encounter for immunization: Secondary | ICD-10-CM | POA: Diagnosis not present

## 2020-08-20 DIAGNOSIS — J8410 Pulmonary fibrosis, unspecified: Secondary | ICD-10-CM | POA: Diagnosis not present

## 2020-08-31 ENCOUNTER — Other Ambulatory Visit (HOSPITAL_COMMUNITY): Payer: Self-pay | Admitting: Interventional Radiology

## 2020-08-31 ENCOUNTER — Telehealth (HOSPITAL_COMMUNITY): Payer: Self-pay | Admitting: Radiology

## 2020-08-31 DIAGNOSIS — I714 Abdominal aortic aneurysm, without rupture, unspecified: Secondary | ICD-10-CM

## 2020-08-31 NOTE — Telephone Encounter (Signed)
Called pt to schedule endoleak repair with Dr. Laurence Ferrari. Patient cannot come on 09/04/20 for treatment. Will call him back to schedule. JM

## 2020-10-08 DIAGNOSIS — H04552 Acquired stenosis of left nasolacrimal duct: Secondary | ICD-10-CM | POA: Diagnosis not present

## 2020-10-28 DIAGNOSIS — Z961 Presence of intraocular lens: Secondary | ICD-10-CM | POA: Diagnosis not present

## 2020-10-28 DIAGNOSIS — H2512 Age-related nuclear cataract, left eye: Secondary | ICD-10-CM | POA: Diagnosis not present

## 2020-10-28 DIAGNOSIS — H04552 Acquired stenosis of left nasolacrimal duct: Secondary | ICD-10-CM | POA: Diagnosis not present

## 2020-10-28 DIAGNOSIS — Z9841 Cataract extraction status, right eye: Secondary | ICD-10-CM | POA: Diagnosis not present

## 2020-10-28 DIAGNOSIS — H179 Unspecified corneal scar and opacity: Secondary | ICD-10-CM | POA: Diagnosis not present

## 2020-10-29 DIAGNOSIS — R351 Nocturia: Secondary | ICD-10-CM | POA: Diagnosis not present

## 2020-10-29 DIAGNOSIS — N401 Enlarged prostate with lower urinary tract symptoms: Secondary | ICD-10-CM | POA: Diagnosis not present

## 2020-10-29 DIAGNOSIS — R3914 Feeling of incomplete bladder emptying: Secondary | ICD-10-CM | POA: Diagnosis not present

## 2020-11-16 ENCOUNTER — Telehealth: Payer: Self-pay | Admitting: Internal Medicine

## 2020-11-16 DIAGNOSIS — N183 Chronic kidney disease, stage 3 unspecified: Secondary | ICD-10-CM | POA: Diagnosis not present

## 2020-11-16 DIAGNOSIS — R5383 Other fatigue: Secondary | ICD-10-CM | POA: Diagnosis not present

## 2020-11-16 DIAGNOSIS — E785 Hyperlipidemia, unspecified: Secondary | ICD-10-CM | POA: Diagnosis not present

## 2020-11-16 DIAGNOSIS — R202 Paresthesia of skin: Secondary | ICD-10-CM | POA: Diagnosis not present

## 2020-11-16 DIAGNOSIS — D7589 Other specified diseases of blood and blood-forming organs: Secondary | ICD-10-CM | POA: Diagnosis not present

## 2020-11-16 NOTE — Telephone Encounter (Signed)
Pt c/o Syncope: STAT if syncope occurred within 30 minutes and pt complains of lightheadedness High Priority if episode of passing out, completely, today or in last 24 hours   1. Did you pass out today? Not sure   2. When is the last time you passed out? Not sure   3. Has this occurred multiple times? Yes, has occurred 4 times   4. Did you have any symptoms prior to passing out? No   Anderson Malta is calling stating Dr. Joylene Draft is wanting Da[place] worked in ASAP due to 4 recent syncope episodes. Edgar was considered a NP as of September and the first available would be 11/24/20 at 9:20 AM with Dr. Lyda Kalata at this time. Dr. Lyda Kalata was not currently in office to ask the okay to use the "Provider Use" slot so a f/u has not been scheduled at this time. Anderson Malta states if she is not in when calling back to have the patient worked in you can ask to speak with the on call nurse. Please advise.

## 2020-11-16 NOTE — Telephone Encounter (Signed)
Attempted phone call to PCP office to speak with Anderson Malta.  Office is now closed.  Message states may contact provider on call.  Unfortunately, provider would not be able to contact this office until tomorrow am.   Will have triage nurse contact PCP office in the am to request a NP referral be placed so pt may be scheduled.  Due to pt's syncopal episodes it would be advisable to go ahead and request referral to EP.

## 2020-11-17 NOTE — Telephone Encounter (Signed)
I spoke with patient and scheduled him to see Dr Johnsie Cancel on 11/19/20 at 9:45.  I left voicemail message for Estill Bamberg at Dr Perini's office regarding appointment.  I asked for records to be faxed.  I requested call back to confirm message was received and to see if any more information needed.

## 2020-11-17 NOTE — Progress Notes (Signed)
CARDIOLOGY CONSULT NOTE       Patient ID: [name] MRN: [id number] DOB/AGE: 01/23/1930 86 y.o.  Admit date: (Not on file) Referring Physician: Perini Primary Physician: Randall Infante, MD Primary Cardiologist: [name] Reason for Consultation: Syncope  Active Problems:   * No active hospital problems. *   HPI:  86 y.o. previously seen by [name] 08/14/17 for preoperative clearance He had enlarging AAA and needed EVAR with [name] No cardiac testing thought needed as he was active with no symptoms He has HLD on statin and ASA Lab review shows normal Hct, TSH and Cr of 1.3    Last Tuesday afternoon around 4:00 was sitting at his desk and just felt weak for about 2 hours. No chest pain  Palpitations dyspnea diaphoresis Drove himself home and felt better. Prior to episode he had worked at the [employer] and worked out. He walks, does elliptical everyday for at least 30 minutes with no issues. He had eaten lunch  He has been on low dose prednisone for years for PMR He was concerned that his cortisol was low but said it was checked by [name] and ok  No recurrent episodes   He has f/u with [name] next month for his EVAR   ROS All other systems reviewed and negative except as noted above  Past Medical History:  Diagnosis Date  . AAA (abdominal aortic aneurysm) ([place])   . Abnormal EKG    Left atrial abnormality  . Allergic rhinitis   . Allergy   . Benign neoplasm of colon 04/14/10   3 small polyps on colonoscopy by [name]  . Carpal tunnel syndrome   . Cataract   . Degenerative disc disease   . Disturbances metabolism of methionine, homocystine, and cystathionine (HCC)    Elevated homocysteine  . Elevated prostate specific antigen (PSA)   . Hearing loss   . Hyperlipidemia   . Impotence of organic origin    Penile implant  . Internal hemorrhoids   . Neck pain   . Otosclerosis of both ears   . Peripheral vascular disease (HCC)    Bilateral femoral bruit  .  Plantar fasciitis   . Polymyalgia rheumatica ([place])   . Rotator cuff syndrome of left shoulder   . Scoliosis   . Shoulder pain     Family History  Problem Relation Age of Onset  . Heart disease Mother   . Emphysema Mother   . Colon cancer Neg Hx   . Esophageal cancer Neg Hx   . Rectal cancer Neg Hx   . Stomach cancer Neg Hx     Social History   Socioeconomic History  . Marital status: Married    Spouse name: [name]  . Number of children: 2  . Years of education: Not on file  . Highest education level: Not on file  Occupational History  . Occupation: Physicist, medical: [employer]  Tobacco Use  . Smoking status: Never Smoker  . Smokeless tobacco: Never Used  Vaping Use  . Vaping Use: Never used  Substance and Sexual Activity  . Alcohol use: No    Alcohol/week: 0.0 standard drinks  . Drug use: No  . Sexual activity: Not Currently  Other Topics Concern  . 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   Social Determinants of Health   Financial Resource Strain: Not  on file  Food Insecurity: Not on file  Transportation Needs: Not on file  Physical Activity: Not on file  Stress: Not on file  Social Connections: Not on file  Intimate Partner Violence: Not on file    Past Surgical History:  Procedure Laterality Date  . ABDOMINAL AORTIC ENDOVASCULAR STENT GRAFT N/A 08/16/2017   Procedure: ABDOMINAL AORTIC ENDOVASCULAR STENT GRAFT insertion;  Surgeon: Randall Mitchell, MD;  Location: [facility] OR;  Service: Vascular;  Laterality: N/A;  . Actinic keratosis removal  06/21/2010   Left shoulder; Randall. Sarajane Alexander  . COLONOSCOPY    . COLONOSCOPY W/ POLYPECTOMY    . DUPUYTREN CONTRACTURE RELEASE Right 12/05/2013   Procedure: DUPUYTREN CONTRACTURE RELEASE RIGHT LONG, RING AND SMALL FINGERS;  Surgeon: [name]., MD;  Location: [facility];  Service:  Orthopedics;  Laterality: Right;  . Implant penile pump  2001  . IR RADIOLOGIST EVAL & MGMT  07/30/2020  . Resection of appendix and tip of rectum  February 2005  . STAPEDECTOMY Bilateral 1985, 1988   [facility]      Current Outpatient Medications:  .  acyclovir (ZOVIRAX) 400 MG tablet, ONE TABLET TWICE A DAY DUE TO HERPES INFECTION, Disp: 60 tablet, Rfl: 5 .  aspirin EC 81 MG tablet, Take 81 mg by mouth at bedtime., Disp: , Rfl:  .  butalbital-acetaminophen-caffeine (FIORICET, ESGIC) 50-325-40 MG tablet, Take 1-2 tablets by mouth 4 (four) times daily as needed for headache., Disp: , Rfl:  .  fish oil-omega-3 fatty acids 1000 MG capsule, Take 1,000 mg by mouth daily. , Disp: , Rfl:  .  fluorometholone (FML) 0.1 % ophthalmic suspension, Place 1 drop into the right eye at bedtime. , Disp: , Rfl:  .  L-Methylfolate-B12-B6-B2 (METAFOLBIC) 04-28-49-5 MG TABS, TAKE ONE TABLET TWICE DAILY, Disp: 60 tablet, Rfl: 2 .  Multiple Vitamins-Minerals (MULTIVITAMIN PO), Take 1 tablet by mouth daily. , Disp: , Rfl:  .  predniSONE (DELTASONE) 5 MG tablet, Take 5 mg by mouth daily. To prevent myalgias, Disp: , Rfl:  .  rosuvastatin (CRESTOR) 5 MG tablet, TAKE ONE TABLET EACH DAY TO REDUCE CHOLESTEROL, Disp: 90 tablet, Rfl: 1 .  tamsulosin (FLOMAX) 0.4 MG CAPS capsule, Take 0.8 mg by mouth at bedtime., Disp: , Rfl:  .  traMADol (ULTRAM) 50 MG tablet, Take 1 tablet (50 mg total) by mouth every 6 (six) hours as needed for moderate pain., Disp: 10 tablet, Rfl: 0 .  VITAMIN D, CHOLECALCIFEROL, PO, Take 1 tablet by mouth daily. , Disp: , Rfl:   Current Facility-Administered Medications:  .  0.9 %  sodium chloride infusion, 500 mL, Intravenous, Once, Randall Shipper, MD  . sodium chloride      Physical Exam: There were no vitals taken for this visit.    Affect appropriate Healthy:  appears stated age 86: normal Neck supple with no adenopathy JVP normal no bruits no thyromegaly Lungs clear with no  wheezing and good diaphragmatic motion Heart:  S1/S2 no murmur, no rub, gallop or click PMI normal Abdomen: benighn, BS positve, no tenderness, no AAA no bruit.  No HSM or HJR Distal pulses intact with no bruits No edema Neuro non-focal Skin warm and dry No muscular weakness   Labs:   Lab Results  Component Value Date   WBC 11.6 (H) 01/24/2018   HGB 13.3 01/24/2018   HCT 39.1 01/24/2018   MCV 98.5 01/24/2018   PLT 203 01/24/2018   No results for input(s): NA, K,  CL, CO2, BUN, CREATININE, CALCIUM, PROT, BILITOT, ALKPHOS, ALT, AST, GLUCOSE in the last 168 hours.  Invalid input(s): LABALBU Lab Results  Component Value Date   [id number] 06/22/2017   TROPONINI <0.03 06/22/2017    Lab Results  Component Value Date   CHOL 148 09/26/2016   CHOL 138 03/21/2016   CHOL 127 09/21/2015   Lab Results  Component Value Date   HDL 52 09/26/2016   HDL 56 03/21/2016   HDL 52 09/21/2015   Lab Results  Component Value Date   LDLCALC 82 09/26/2016   LDLCALC 68 03/21/2016   LDLCALC 66 09/21/2015   Lab Results  Component Value Date   TRIG 71 09/26/2016   TRIG 68 03/21/2016   TRIG 44 09/21/2015   Lab Results  Component Value Date   CHOLHDL 2.8 09/26/2016   CHOLHDL 2.5 03/21/2016   CHOLHDL 2.4 09/21/2015   No results found for: LDLDIRECT    Radiology: No results found.  EKG: SR low voltage otherwise normal 01/25/18   ASSESSMENT AND PLAN:   1. Syncope:  Patient indicates he never passed out just felt weak. No antecedent cardiac symptoms Normal exam and ECG Patient favors observation and no testing I think this is reasonable given how active he is without cardiac symptoms   2.  AAA: post EVAR f/u [name]   3. HLD  On statin labs with [name]   4. PMR:  Continue low dose prednisone consider stress doses when sick   Signed: Jenkins Rouge 11/17/2020, 12:28 PM

## 2020-11-17 NOTE — Telephone Encounter (Signed)
[name] is returning [name]'s call stating she received her VM and has faxed over all the information they have. She states they have occasional issues with their fax machine, so if it is not received she is requesting a callback so it can be resent. Please advise.

## 2020-11-19 ENCOUNTER — Ambulatory Visit (INDEPENDENT_AMBULATORY_CARE_PROVIDER_SITE_OTHER): Payer: Medicare Other | Admitting: Cardiovascular Disease

## 2020-11-19 ENCOUNTER — Encounter: Payer: Self-pay | Admitting: Cardiovascular Disease

## 2020-11-19 ENCOUNTER — Other Ambulatory Visit: Payer: Self-pay

## 2020-11-19 VITALS — BP 120/78 | HR 75 | Ht 72.0 in | Wt 184.8 lb

## 2020-11-19 DIAGNOSIS — R55 Syncope and collapse: Secondary | ICD-10-CM

## 2020-11-19 DIAGNOSIS — I714 Abdominal aortic aneurysm, without rupture, unspecified: Secondary | ICD-10-CM

## 2020-11-19 NOTE — Patient Instructions (Addendum)
Medication Instructions:  *If you need a refill on your cardiac medications before your next appointment, please call your pharmacy*  Lab Work: If you have labs (blood work) drawn today and your tests are completely normal, you will receive your results only by: . MyChart Message (if you have MyChart) OR . A paper copy in the mail If you have any lab test that is abnormal or we need to change your treatment, we will call you to review the results.  Testing/Procedures: None ordered today.  Follow-Up: At [facility], you and your health needs are our priority.  As part of our continuing mission to provide you with exceptional heart care, we have created designated Provider Care Teams.  These Care Teams include your primary Cardiologist (physician) and Advanced Practice Providers (APPs -  Physician Assistants and Nurse Practitioners) who all work together to provide you with the care you need, when you need it.  We recommend signing up for the patient portal called "MyChart".  Sign up information is provided on this After Visit Summary.  MyChart is used to connect with patients for Virtual Visits (Telemedicine).  Patients are able to view lab/test results, encounter notes, upcoming appointments, etc.  Non-urgent messages can be sent to your provider as well.   To learn more about what you can do with MyChart, go to [email or web address].    Your next appointment:   As needed  The format for your next appointment:   In Person  Provider:   You may see Dr. Nishan or one of the following Advanced Practice Providers on your designated Care Team:    Lori Gerhardt, NP  Laura Ingold, NP  Jill McDaniel, NP   

## 2020-12-02 DIAGNOSIS — H179 Unspecified corneal scar and opacity: Secondary | ICD-10-CM | POA: Diagnosis not present

## 2020-12-02 DIAGNOSIS — Z9841 Cataract extraction status, right eye: Secondary | ICD-10-CM | POA: Diagnosis not present

## 2020-12-02 DIAGNOSIS — H04552 Acquired stenosis of left nasolacrimal duct: Secondary | ICD-10-CM | POA: Diagnosis not present

## 2020-12-02 DIAGNOSIS — Z961 Presence of intraocular lens: Secondary | ICD-10-CM | POA: Diagnosis not present

## 2020-12-02 DIAGNOSIS — H2512 Age-related nuclear cataract, left eye: Secondary | ICD-10-CM | POA: Diagnosis not present

## 2020-12-03 ENCOUNTER — Telehealth (HOSPITAL_COMMUNITY): Payer: Self-pay | Admitting: Radiology

## 2020-12-03 NOTE — Telephone Encounter (Signed)
Called pt, left VM for him to call me to set up his endoleak repair with Archer Asa. JM

## 2020-12-09 ENCOUNTER — Other Ambulatory Visit: Payer: Self-pay | Admitting: Radiology

## 2020-12-10 ENCOUNTER — Other Ambulatory Visit: Payer: Self-pay | Admitting: Student

## 2020-12-10 ENCOUNTER — Other Ambulatory Visit: Payer: Self-pay | Admitting: Radiology

## 2020-12-10 DIAGNOSIS — M79642 Pain in left hand: Secondary | ICD-10-CM | POA: Diagnosis not present

## 2020-12-10 NOTE — H&P (Signed)
Chief Complaint: Patient was seen in consultation today for Type 2 Endoleak  Referring Physician(s): Dr. [name]  Supervising Physician: Jacqulynn Cadet  Patient Status: [facility] - Out[facility]  History of Present Illness: [name] is an 87 y.o. male with a medical history significant for an abdominal aortic aneurysm status post endovascular repair with a bifurcated endoprosthesis 08/16/17. At that time, the maximal diameter of the sac was 5.2 cm. Surveillance imaging January 2021 demonstrated an increase to 5.6 cm with a small, persistent type II endoleak. Imaging obtained 06/30/20 showed further growth of the sac to 5.8 cm with continued endoleak.   Dr. [name] consulted IR for possible repair of the endoleak and the patient met with Dr. Laurence Ferrari 07/30/20 via tele-health visit to discuss his options. The decision was made to first pursue an arteriogram with an attempted embolization of the aneurysm vessels. If this approach is unsuccessful a direct sac puncture under general anesthesia may be considered.    Past Medical History:  Diagnosis Date  . AAA (abdominal aortic aneurysm) ([place])   . Abnormal EKG    Left atrial abnormality  . Allergic rhinitis   . Allergy   . Benign neoplasm of colon 04/14/10   3 small polyps on colonoscopy by Dr. Henrene Pastor  . Carpal tunnel syndrome   . Cataract   . Degenerative disc disease   . Disturbances metabolism of methionine, homocystine, and cystathionine (HCC)    Elevated homocysteine  . Elevated prostate specific antigen (PSA)   . Hearing loss   . Hyperlipidemia   . Impotence of organic origin    Penile implant  . Internal hemorrhoids   . Neck pain   . Otosclerosis of both ears   . Peripheral vascular disease (HCC)    Bilateral femoral bruit  . Plantar fasciitis   . Polymyalgia rheumatica ([place])   . Rotator cuff syndrome of left shoulder   . Scoliosis   . Shoulder pain     Past Surgical History:  Procedure Laterality Date  . ABDOMINAL  AORTIC ENDOVASCULAR STENT GRAFT N/A 08/16/2017   Procedure: ABDOMINAL AORTIC ENDOVASCULAR STENT GRAFT insertion;  Surgeon: Serafina Mitchell, MD;  Location: [facility] OR;  Service: Vascular;  Laterality: N/A;  . Actinic keratosis removal  06/21/2010   Left shoulder; Dr. Sarajane Jews  . COLONOSCOPY    . COLONOSCOPY W/ POLYPECTOMY    . DUPUYTREN CONTRACTURE RELEASE Right 12/05/2013   Procedure: DUPUYTREN CONTRACTURE RELEASE RIGHT LONG, RING AND SMALL FINGERS;  Surgeon: Cammie Sickle., MD;  Location: [facility];  Service: Orthopedics;  Laterality: Right;  . Implant penile pump  2001  . IR RADIOLOGIST EVAL & MGMT  07/30/2020  . Resection of appendix and tip of rectum  February 2005  . STAPEDECTOMY Bilateral 1985, 1988   [employer]    Allergies: Amoxicillin, Codeine, Doxycycline, and Morphine and related  Medications: Prior to Admission medications   Medication Sig Start Date End Date Taking? Authorizing Provider  acyclovir (ZOVIRAX) 400 MG tablet ONE TABLET TWICE A DAY DUE TO HERPES INFECTION Patient taking differently: Take 400 mg by mouth daily. 01/22/18  Yes [name], NP  cholecalciferol (VITAMIN D3) 25 MCG (1000 UNIT) tablet Take 1,000 Units by mouth daily.   Yes [provider]  fish oil-omega-3 fatty acids 1000 MG capsule Take 1,000 mg by mouth daily.    Yes [provider]  fluorometholone (FML) 0.1 % ophthalmic suspension Place 1 drop into the right eye at bedtime.  Yes [provider]  L-Methylfolate-B12-B6-B2 (METAFOLBIC) 04-28-49-5 MG TABS TAKE ONE TABLET TWICE DAILY Patient taking differently: Take 1 tablet by mouth in the morning and at bedtime. 08/18/17  Yes [name], NP  Multiple Vitamins-Minerals (MULTIVITAMIN PO) Take 1 tablet by mouth daily.    Yes [provider]  predniSONE (DELTASONE) 5 MG tablet Take 5 mg by mouth daily. To prevent myalgias   Yes [provider]  rosuvastatin (CRESTOR) 5 MG tablet  TAKE ONE TABLET EACH DAY TO REDUCE CHOLESTEROL Patient taking differently: Take 5 mg by mouth daily. 09/01/17  Yes [name], NP  tamsulosin (FLOMAX) 0.4 MG CAPS capsule Take 0.8 mg by mouth at bedtime. 06/10/20  Yes [provider]     Family History  Problem Relation Age of Onset  . Heart disease Mother   . Emphysema Mother   . Colon cancer Neg Hx   . Esophageal cancer Neg Hx   . Rectal cancer Neg Hx   . Stomach cancer Neg Hx     Social History   Socioeconomic History  . Marital status: Married    Spouse name: Geovani Tootle  . Number of children: 2  . Years of education: Not on file  . Highest education level: Not on file  Occupational History  . Occupation: Physicist, medical: [employer]  Tobacco Use  . Smoking status: Never Smoker  . Smokeless tobacco: Never Used  Vaping Use  . Vaping Use: Never used  Substance and Sexual Activity  . Alcohol use: No    Alcohol/week: 0.0 standard drinks  . Drug use: No  . Sexual activity: Not Currently  Other Topics Concern  . 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   Social Determinants of Health   Financial Resource Strain: Not on file  Food Insecurity: Not on file  Transportation Needs: Not on file  Physical Activity: Not on file  Stress: Not on file  Social Connections: Not on file    Review of Systems: A 12 point ROS discussed and pertinent positives are indicated in the HPI above.  All other systems are negative.  Review of Systems  Constitutional: Negative for appetite change and fatigue.  Respiratory: Negative for cough and shortness of breath.   Cardiovascular: Negative for chest pain and leg swelling.  Gastrointestinal: Negative for abdominal pain, diarrhea, nausea and vomiting.  Musculoskeletal: Negative for back pain.  Neurological: Negative for dizziness and headaches.    Vital  Signs: BP (!) 145/86   Pulse 74   Temp 98.1 F (36.7 C) (Oral)   Resp 16   Ht 6' (1.829 m)   Wt 175 lb (79.4 kg)   SpO2 99%   BMI 23.73 kg/m   Physical Exam Constitutional:      General: He is not in acute distress. HENT:     Mouth/Throat:     Mouth: Mucous membranes are moist.     Pharynx: Oropharynx is clear.  Cardiovascular:     Rate and Rhythm: Normal rate and regular rhythm.     Pulses: Normal pulses.     Heart sounds: Normal heart sounds.  Pulmonary:     Effort: Pulmonary effort is normal.     Breath sounds: Normal breath sounds.  Abdominal:     General: Bowel sounds are normal.     Palpations: Abdomen is soft.  Musculoskeletal:  General: Normal range of motion.     Cervical back: Normal range of motion.  Skin:    General: Skin is warm and dry.  Neurological:     Mental Status: He is alert and oriented to person, place, and time.     Imaging: No results found.  Labs:  CBC: Recent Labs    12/11/20 0635  WBC 9.3  HGB 13.9  HCT 41.4  PLT 226    COAGS: Recent Labs    12/11/20 0635  INR 1.1    BMP: Recent Labs    12/11/20 0635  NA 138  K 4.1  CL 105  CO2 23  GLUCOSE 105*  BUN 19  CALCIUM 9.0  CREATININE 1.22  GFRNONAA 57*    LIVER FUNCTION TESTS: No results for input(s): BILITOT, AST, ALT, ALKPHOS, PROT, ALBUMIN in the last 8760 hours.  TUMOR MARKERS: No results for input(s): AFPTM, CEA, CA199, CHROMGRNA in the last 8760 hours.  Assessment and Plan:  Interval increase in diameter of native aneurysm sac; type II endoleak: [name], 87 year old male, presents today [facility] Interventional Radiology department for an image-guided arteriogram to identify the source of the vessels contributing to the endoleak followed by an attempted embolization of the flow channel within the aneurysm sac and embolization of the supplying vessels. This procedure will be done with moderate sedation.    Risks and benefits of this  procedure were discussed with the patient including, but not limited to bleeding, infection, vascular injury or contrast induced renal failure.  This interventional procedure involves the use of X-rays and because of the nature of the planned procedure, it is possible that we will have prolonged use of X-ray fluoroscopy.  Potential radiation risks to you include (but are not limited to) the following: - A slightly elevated risk for cancer  several years later in life. This  risk is typically less than 0.5% percent. This risk is low in  comparison to the normal incidence of human cancer, which is  33% for women and 50% for men according to the [employer]. - Radiation induced injury can include skin redness, resembling a rash, tissue breakdown / ulcers and hair loss (which can be temporary or permanent).   The likelihood of either of these occurring depends on the difficulty of the procedure and whether you are sensitive to radiation due to previous procedures, disease, or genetic conditions.   IF your procedure requires a prolonged use of radiation, you will be notified and given written instructions for further action.  It is your responsibility to monitor the irradiated area for the 2 weeks following the procedure and to notify your physician if you are concerned that you have suffered a radiation induced injury.    All of the patient's questions were answered, patient is agreeable to proceed.  Consent signed and in chart.  Thank you for this interesting consult.  I greatly enjoyed meeting [name] and look forward to participating in their care.  A copy of this report was sent to the requesting provider on this date.  Electronically Signed: Soyla Dryer, AGACNP-BC [phone] 12/11/2020, 7:56 AM   I spent a total of  30 Minutes   in face to face in clinical consultation, greater than 50% of which was counseling/coordinating care for arteriogram with possible  embolization of aneurysm vessels.

## 2020-12-11 ENCOUNTER — Other Ambulatory Visit (HOSPITAL_COMMUNITY): Payer: Self-pay | Admitting: Interventional Radiology

## 2020-12-11 ENCOUNTER — Ambulatory Visit (HOSPITAL_COMMUNITY)
Admission: RE | Admit: 2020-12-11 | Discharge: 2020-12-11 | Disposition: A | Discharge status: Home / Self Care | Payer: Medicare Other | Source: Ambulatory Visit | Attending: Interventional Radiology | Admitting: Interventional Radiology

## 2020-12-11 ENCOUNTER — Other Ambulatory Visit: Payer: Self-pay

## 2020-12-11 DIAGNOSIS — Z885 Allergy status to narcotic agent status: Secondary | ICD-10-CM | POA: Diagnosis not present

## 2020-12-11 DIAGNOSIS — Y832 Surgical operation with anastomosis, bypass or graft as the cause of abnormal reaction of the patient, or of later complication, without mention of misadventure at the time of the procedure: Secondary | ICD-10-CM | POA: Insufficient documentation

## 2020-12-11 DIAGNOSIS — I714 Abdominal aortic aneurysm, without rupture, unspecified: Secondary | ICD-10-CM

## 2020-12-11 DIAGNOSIS — Z881 Allergy status to other antibiotic agents status: Secondary | ICD-10-CM | POA: Diagnosis not present

## 2020-12-11 DIAGNOSIS — I9789 Other postprocedural complications and disorders of the circulatory system, not elsewhere classified: Secondary | ICD-10-CM | POA: Diagnosis not present

## 2020-12-11 DIAGNOSIS — T82330A Leakage of aortic (bifurcation) graft (replacement), initial encounter: Secondary | ICD-10-CM | POA: Diagnosis not present

## 2020-12-11 HISTORY — PX: IR ANGIOGRAM SELECTIVE EACH ADDITIONAL VESSEL: IMG667

## 2020-12-11 HISTORY — PX: IR ANGIOGRAM PELVIS SELECTIVE OR SUPRASELECTIVE: IMG661

## 2020-12-11 HISTORY — PX: IR EMBO ARTERIAL NOT HEMORR HEMANG INC GUIDE ROADMAPPING: IMG5448

## 2020-12-11 HISTORY — PX: IR US GUIDE VASC ACCESS RIGHT: IMG2390

## 2020-12-11 LAB — PROTIME-INR
INR: 1.1 (ref 0.8–1.2)
Prothrombin Time: 14 seconds (ref 11.4–15.2)

## 2020-12-11 LAB — BASIC METABOLIC PANEL
Anion gap: 10 (ref 5–15)
BUN: 19 mg/dL (ref 8–23)
CO2: 23 mmol/L (ref 22–32)
Calcium: 9 mg/dL (ref 8.9–10.3)
Chloride: 105 mmol/L (ref 98–111)
Creatinine, Ser: 1.22 mg/dL (ref 0.61–1.24)
GFR, Estimated: 57 mL/min — ABNORMAL LOW (ref >60)
Glucose, Bld: 105 mg/dL — ABNORMAL HIGH (ref 70–99)
Potassium: 4.1 mmol/L (ref 3.5–5.1)
Sodium: 138 mmol/L (ref 135–145)

## 2020-12-11 LAB — CBC WITH DIFFERENTIAL/PLATELET
Abs Immature Granulocytes: 0.03 10*3/uL (ref 0.00–0.07)
Basophils Absolute: 0.1 10*3/uL (ref 0.0–0.1)
Basophils Relative: 1 %
Eosinophils Absolute: 0.1 10*3/uL (ref 0.0–0.5)
Eosinophils Relative: 1 %
HCT: 41.4 % (ref 39.0–52.0)
Hemoglobin: 13.9 g/dL (ref 13.0–17.0)
Immature Granulocytes: 0 %
Lymphocytes Relative: 17 %
Lymphs Abs: 1.5 10*3/uL (ref 0.7–4.0)
MCH: 33.6 pg (ref 26.0–34.0)
MCHC: 33.6 g/dL (ref 30.0–36.0)
MCV: 100 fL (ref 80.0–100.0)
Monocytes Absolute: 0.8 10*3/uL (ref 0.1–1.0)
Monocytes Relative: 9 %
Neutro Abs: 6.8 10*3/uL (ref 1.7–7.7)
Neutrophils Relative %: 72 %
Platelets: 226 10*3/uL (ref 150–400)
RBC: 4.14 MIL/uL — ABNORMAL LOW (ref 4.22–5.81)
RDW: 13.4 % (ref 11.5–15.5)
WBC: 9.3 10*3/uL (ref 4.0–10.5)
nRBC: 0 % (ref 0.0–0.2)

## 2020-12-11 MED ORDER — MIDAZOLAM HCL 2 MG/2ML IJ SOLN
INTRAMUSCULAR | Status: AC
  Filled 2020-12-11: qty 2

## 2020-12-11 MED ORDER — IOHEXOL 300 MG/ML  SOLN
150.0000 mL | Freq: Once | INTRAMUSCULAR | Status: AC | PRN
  Administered 2020-12-11: 75 mL via INTRA_ARTERIAL

## 2020-12-11 MED ORDER — LIDOCAINE HCL 1 % IJ SOLN
INTRAMUSCULAR | Status: AC
  Filled 2020-12-11: qty 20

## 2020-12-11 MED ORDER — MIDAZOLAM HCL 2 MG/2ML IJ SOLN
INTRAMUSCULAR | Status: AC | PRN
  Administered 2020-12-11 (×9): 0.5 mg via INTRAVENOUS
  Administered 2020-12-11: 1 mg via INTRAVENOUS

## 2020-12-11 MED ORDER — FENTANYL CITRATE (PF) 100 MCG/2ML IJ SOLN
INTRAMUSCULAR | Status: AC
  Filled 2020-12-11: qty 2

## 2020-12-11 MED ORDER — IOHEXOL 300 MG/ML  SOLN
150.0000 mL | Freq: Once | INTRAMUSCULAR | Status: AC | PRN
  Administered 2020-12-11: 90 mL via INTRA_ARTERIAL

## 2020-12-11 MED ORDER — FENTANYL CITRATE (PF) 100 MCG/2ML IJ SOLN
INTRAMUSCULAR | Status: AC | PRN
  Administered 2020-12-11 (×2): 25 ug via INTRAVENOUS
  Administered 2020-12-11: 50 ug via INTRAVENOUS
  Administered 2020-12-11 (×3): 25 ug via INTRAVENOUS

## 2020-12-11 MED ORDER — SODIUM CHLORIDE 0.9 % IV SOLN: INTRAVENOUS | Status: DC

## 2020-12-11 NOTE — Discharge Instructions (Addendum)
Femoral Site Care This sheet gives you information about how to care for yourself after your procedure. Your health care provider may also give you more specific instructions. If you have problems or questions, contact your health care provider. What can I expect after the procedure? After the procedure, it is common to have:  Bruising that usually fades within 1-2 weeks.  Tenderness at the site. Follow these instructions at home: Wound care 1. May remove bandage after 24 hours. 2. Do not take baths, swim, or use a hot tub for 5 days. 3. You may shower 24-48 hours after the procedure. ? Gently wash the site with plain soap and water. ? Pat the area dry with a clean towel. ? Do not rub the site. This may cause bleeding. 4. Do not apply powder or lotion to the site. Keep the site clean and dry. 5. Check your femoral site every day for signs of infection. Check for: ? Redness, swelling, or pain. ? Fluid or blood. ? Warmth. ? Pus or a bad smell. Activity 1. For the first 2-3 days after your procedure, or as long as directed: ? Avoid climbing stairs as much as possible. ? Do not squat. 2. Do not lift, push or pull anything that is heavier than 10 lb for 5 days. 3. Rest as directed. ? Avoid sitting for a long time without moving. Get up to take short walks every 1-2 hours. 4. Do not drive for 24 hours. General instructions  Take over-the-counter and prescription medicines only as told by your health care provider.  Keep all follow-up visits as told by your health care provider. This is important.  DRINK PLENTY OF FLUIDS FOR THE NEXT 2-3 DAYS. Contact a health care provider if you have:  A fever or chills.  You have redness, swelling, or pain around your insertion site. Get help right away if:  The catheter insertion area swells very fast.  You pass out.  You suddenly start to sweat or your skin gets clammy.  The catheter insertion area is bleeding, and the bleeding does  not stop when you hold steady pressure on the area.  The area near or just beyond the catheter insertion site becomes pale, cool, tingly, or numb. These symptoms may represent a serious problem that is an emergency. Do not wait to see if the symptoms will go away. Get medical help right away. Call your local emergency services (911 in the [place] Do not drive yourself to the hospital. Summary  After the procedure, it is common to have bruising that usually fades within 1-2 weeks.  Check your femoral site every day for signs of infection.  Do not lift, push or pull anything that is heavier than 10 lb for 5 days.  This information is not intended to replace advice given to you by your health care provider. Make sure you discuss any questions you have with your health care provider. Document Revised: 11/27/2017 Document Reviewed: 11/27/2017 Elsevier Patient Education  [place].   Moderate Conscious Sedation, Adult Sedation is the use of medicines to promote relaxation and to relieve discomfort and anxiety. Moderate conscious sedation is a type of sedation. Under moderate conscious sedation, you are less alert than normal, but you are still able to respond to instructions, touch, or both. Moderate conscious sedation is used during short medical and dental procedures. It is milder than deep sedation, which is a type of sedation under which you cannot be easily woken up. It is also  milder than general anesthesia, which is the use of medicines to make you unconscious. Moderate conscious sedation allows you to return to your regular activities sooner. Tell a health care provider about:  Any allergies you have.  All medicines you are taking, including vitamins, herbs, eye drops, creams, and over-the-counter medicines.  Any use of steroids. This includes steroids taken by mouth or as a cream.  Any problems you or family members have had with sedatives and anesthetic medicines.  Any blood  disorders you have.  Any surgeries you have had.  Any medical conditions you have, such as sleep apnea.  Whether you are pregnant or may be pregnant.  Any use of cigarettes, alcohol, marijuana, or drugs. What are the risks? Generally, this is a safe procedure. However, problems may occur, including:  Getting too much medicine (oversedation).  Nausea.  Allergic reaction to medicines.  Trouble breathing. If this happens, a breathing tube may be used. It will be removed when you are awake and breathing on your own.  Heart trouble.  Lung trouble.  Confusion that gets better with time (emergence delirium). What happens before the procedure? Staying hydrated Follow instructions from your health care provider about hydration, which may include:  Up to 2 hours before the procedure - you may continue to drink clear liquids, such as water, clear fruit juice, black coffee, and plain tea. Eating and drinking restrictions Follow instructions from your health care provider about eating and drinking, which may include:  8 hours before the procedure - stop eating heavy meals or foods, such as meat, fried foods, or fatty foods.  6 hours before the procedure - stop eating light meals or foods, such as toast or cereal.  6 hours before the procedure - stop drinking milk or drinks that contain milk.  2 hours before the procedure - stop drinking clear liquids. Medicines Ask your health care provider about:  Changing or stopping your regular medicines. This is especially important if you are taking diabetes medicines or blood thinners.  Taking medicines such as aspirin and ibuprofen. These medicines can thin your blood. Do not take these medicines unless your health care provider tells you to take them.  Taking over-the-counter medicines, vitamins, herbs, and supplements. Tests and exams  You will have a physical exam.  You may have blood tests done to show how well: ? Your kidneys and  liver work. ? Your blood clots. General instructions  Plan to have a responsible adult take you home from the hospital or clinic.  If you will be going home right after the procedure, plan to have a responsible adult care for you for the time you are told. This is important. What happens during the procedure?  You will be given the sedative. The sedative may be given: ? As a pill that you will swallow. It can also be inserted into the rectum. ? As a spray through the nose. ? As an injection into the muscle. ? As an injection into the vein through an IV.  You may be given oxygen as needed.  Your breathing, heart rate, and blood pressure will be monitored during the procedure.  The medical or dental procedure will be done. The procedure may vary among health care providers and hospitals.   What happens after the procedure?  Your blood pressure, heart rate, breathing rate, and blood oxygen level will be monitored until you leave the hospital or clinic.  You will get fluids through your IV if needed.  Do  not drive or operate machinery until your health care provider says that it is safe. Summary  Sedation is the use of medicines to promote relaxation and to relieve discomfort and anxiety. Moderate conscious sedation is a type of sedation that is used during short medical and dental procedures.  Tell the health care provider about any medical conditions that you have and about all the medicines that you are taking.  You will be given the sedative as a pill, a spray through the nose, an injection into the muscle, or an injection into the vein through an IV. Vital signs are monitored during the sedation.  Moderate conscious sedation allows you to return to your regular activities sooner. This information is not intended to replace advice given to you by your health care provider. Make sure you discuss any questions you have with your health care provider. Document Revised: 03/13/2020  Document Reviewed: 10/10/2019 Elsevier Patient Education  2021 [employer].

## 2020-12-11 NOTE — Procedures (Signed)
Interventional Radiology Procedure Note  Procedure: Type 2 endoleak repair.   Complications: None  Estimated Blood Loss: None  Recommendations: - Bedrest x 1 hr - DC home     Signed,  Criselda Peaches, MD

## 2020-12-11 NOTE — Sedation Documentation (Signed)
Right femoral site closed with Celt. Groin site level 0. Dressing of tegaderm and gauze placed. No issues noted

## 2020-12-11 NOTE — Progress Notes (Signed)
Pt ambulated without difficulty or bleeding.   Discharged home with wife who will drive and stay with pt x 24 hrs 

## 2020-12-17 DIAGNOSIS — H2512 Age-related nuclear cataract, left eye: Secondary | ICD-10-CM | POA: Diagnosis not present

## 2020-12-18 ENCOUNTER — Other Ambulatory Visit: Payer: Self-pay | Admitting: Interventional Radiology

## 2020-12-18 DIAGNOSIS — I9789 Other postprocedural complications and disorders of the circulatory system, not elsewhere classified: Secondary | ICD-10-CM

## 2020-12-24 DIAGNOSIS — I714 Abdominal aortic aneurysm, without rupture: Secondary | ICD-10-CM | POA: Diagnosis not present

## 2020-12-24 DIAGNOSIS — H25812 Combined forms of age-related cataract, left eye: Secondary | ICD-10-CM | POA: Diagnosis not present

## 2021-01-04 ENCOUNTER — Ambulatory Visit: Payer: Medicare Other | Admitting: Surgery

## 2021-01-04 ENCOUNTER — Inpatient Hospital Stay (HOSPITAL_COMMUNITY): Admission: RE | Admit: 2021-01-04 | Payer: Medicare Other | Source: Ambulatory Visit

## 2021-01-04 DIAGNOSIS — H6123 Impacted cerumen, bilateral: Secondary | ICD-10-CM | POA: Diagnosis not present

## 2021-01-25 DIAGNOSIS — L986 Other infiltrative disorders of the skin and subcutaneous tissue: Secondary | ICD-10-CM | POA: Diagnosis not present

## 2021-01-25 DIAGNOSIS — L570 Actinic keratosis: Secondary | ICD-10-CM | POA: Diagnosis not present

## 2021-01-25 DIAGNOSIS — L821 Other seborrheic keratosis: Secondary | ICD-10-CM | POA: Diagnosis not present

## 2021-01-25 DIAGNOSIS — D485 Neoplasm of uncertain behavior of skin: Secondary | ICD-10-CM | POA: Diagnosis not present

## 2021-01-25 DIAGNOSIS — Z85828 Personal history of other malignant neoplasm of skin: Secondary | ICD-10-CM | POA: Diagnosis not present

## 2021-01-28 DIAGNOSIS — H34832 Tributary (branch) retinal vein occlusion, left eye, with macular edema: Secondary | ICD-10-CM | POA: Diagnosis not present

## 2021-01-29 DIAGNOSIS — J8410 Pulmonary fibrosis, unspecified: Secondary | ICD-10-CM | POA: Diagnosis not present

## 2021-02-11 DIAGNOSIS — Z961 Presence of intraocular lens: Secondary | ICD-10-CM | POA: Diagnosis not present

## 2021-02-11 DIAGNOSIS — H34832 Tributary (branch) retinal vein occlusion, left eye, with macular edema: Secondary | ICD-10-CM | POA: Diagnosis not present

## 2021-02-18 DIAGNOSIS — M353 Polymyalgia rheumatica: Secondary | ICD-10-CM | POA: Diagnosis not present

## 2021-02-18 DIAGNOSIS — I714 Abdominal aortic aneurysm, without rupture: Secondary | ICD-10-CM | POA: Diagnosis not present

## 2021-02-18 DIAGNOSIS — R5383 Other fatigue: Secondary | ICD-10-CM | POA: Diagnosis not present

## 2021-02-18 DIAGNOSIS — Z1152 Encounter for screening for COVID-19: Secondary | ICD-10-CM | POA: Diagnosis not present

## 2021-02-22 ENCOUNTER — Other Ambulatory Visit: Payer: Self-pay

## 2021-02-22 DIAGNOSIS — I712 Thoracic aortic aneurysm, without rupture, unspecified: Secondary | ICD-10-CM

## 2021-02-22 DIAGNOSIS — I714 Abdominal aortic aneurysm, without rupture, unspecified: Secondary | ICD-10-CM

## 2021-02-23 ENCOUNTER — Other Ambulatory Visit: Payer: Self-pay

## 2021-02-23 ENCOUNTER — Ambulatory Visit
Admission: RE | Admit: 2021-02-23 | Discharge: 2021-02-23 | Disposition: A | Discharge status: Home / Self Care | Payer: Medicare Other | Source: Ambulatory Visit | Attending: Surgery | Admitting: Surgery

## 2021-02-23 ENCOUNTER — Encounter: Payer: Self-pay | Admitting: Surgery

## 2021-02-23 ENCOUNTER — Ambulatory Visit (INDEPENDENT_AMBULATORY_CARE_PROVIDER_SITE_OTHER): Payer: Medicare Other | Admitting: Surgery

## 2021-02-23 DIAGNOSIS — Q614 Renal dysplasia: Secondary | ICD-10-CM | POA: Diagnosis not present

## 2021-02-23 DIAGNOSIS — K449 Diaphragmatic hernia without obstruction or gangrene: Secondary | ICD-10-CM | POA: Diagnosis not present

## 2021-02-23 DIAGNOSIS — I714 Abdominal aortic aneurysm, without rupture, unspecified: Secondary | ICD-10-CM

## 2021-02-23 DIAGNOSIS — I712 Thoracic aortic aneurysm, without rupture, unspecified: Secondary | ICD-10-CM

## 2021-02-23 DIAGNOSIS — I7409 Other arterial embolism and thrombosis of abdominal aorta: Secondary | ICD-10-CM | POA: Diagnosis not present

## 2021-02-23 MED ORDER — IOPAMIDOL (ISOVUE-370) INJECTION 76%
75.0000 mL | Freq: Once | INTRAVENOUS | Status: AC | PRN
  Administered 2021-02-23: 75 mL via INTRAVENOUS

## 2021-02-23 NOTE — Progress Notes (Signed)
[facility] [place] [facility] [place]  Patient name: [name] MRN: [id number] DOB: 06/19/1930 Sex: male      Virtual Visit via Telephone Note   This visit type was conducted due to national recommendations for restrictions regarding the COVID-19 Pandemic (e.g. social distancing) in an effort to limit this patient's exposure and mitigate transmission in our community.  Due to his co-morbid illnesses, this patient is at least at moderate risk for complications without adequate follow up.  This format is felt to be most appropriate for this patient at this time.  The patient did not have access to video technology/had technical difficulties with video requiring transitioning to audio format only (telephone).  All issues noted in this document were discussed and addressed.  No physical exam could be performed with this format.     Patient Location: Home Provider Location: Office/Clinic   REASON FOR APPOINTMENT:    Follow up  HISTORY OF PRESENT ILLNESS:   [name] is a 87 y.o. male, who is status post endovascular repair on 08/16/2017.  At that time maximum diameter was 5.2 cm.  In January 2021 maximum diameter of his aneurysm was 5.6 cm with a persistent type II endoleak.  In August of 2021, the maximum diameter had increased to 5.8 cm.  I referred him to Dr. Laurence Ferrari with IR, and on 12/11/2020, he underwent liquid embolic embolization of the right L5 lumbar and median sacral arteries.  He also had a 4.4 cm ascending aortic aneurysm.  He continues to take a statin for hypercholesterolemia.  He recently began having some chest pain which was evaluated by Dr. Joylene Draft.  He is scheduled to [facility] and he wanted to make sure there was nothing wrong with his AAA repair prior to travel and so we moved his CTA up to today.    PAST MEDICAL HISTORY    Past Medical History:  Diagnosis Date  . AAA (abdominal aortic aneurysm) ([place])   . Abnormal EKG    Left atrial  abnormality  . Allergic rhinitis   . Allergy   . Benign neoplasm of colon 04/14/10   3 small polyps on colonoscopy by Dr. Henrene Pastor  . Carpal tunnel syndrome   . Cataract   . Degenerative disc disease   . Disturbances metabolism of methionine, homocystine, and cystathionine (HCC)    Elevated homocysteine  . Elevated prostate specific antigen (PSA)   . Hearing loss   . Hyperlipidemia   . Impotence of organic origin    Penile implant  . Internal hemorrhoids   . Neck pain   . Otosclerosis of both ears   . Peripheral vascular disease (HCC)    Bilateral femoral bruit  . Plantar fasciitis   . Polymyalgia rheumatica ([place])   . Rotator cuff syndrome of left shoulder   . Scoliosis   . Shoulder pain      FAMILY HISTORY   Family History  Problem Relation Age of Onset  . Heart disease Mother   . Emphysema Mother   . Colon cancer Neg Hx   . Esophageal cancer Neg Hx   . Rectal cancer Neg Hx   . Stomach cancer Neg Hx     SOCIAL HISTORY:   Social History   Socioeconomic History  . Marital status: Married    Spouse name: Danish Ruffins  . Number of children: 2  . Years of education: Not on file  . Highest  education level: Not on file  Occupational History  . Occupation: Physicist, medical: [employer]  Tobacco Use  . Smoking status: Never Smoker  . Smokeless tobacco: Never Used  Vaping Use  . Vaping Use: Never used  Substance and Sexual Activity  . Alcohol use: No    Alcohol/week: 0.0 standard drinks  . Drug use: No  . Sexual activity: Not Currently  Other Topics Concern  . 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   Social Determinants of Health   Financial Resource Strain: Not on file  Food Insecurity: Not on file  Transportation Needs: Not on file  Physical Activity: Not on file  Stress: Not on file  Social Connections: Not on file  Intimate Partner  Violence: Not on file    ALLERGIES:    Allergies  Allergen Reactions  . Amoxicillin Other (See Comments)  . Codeine Nausea And Vomiting  . Doxycycline Other (See Comments)    Upset stomach, currently taking and tolerating well    . Morphine And Related Nausea And Vomiting    CURRENT MEDICATIONS:    Current Outpatient Medications  Medication Sig Dispense Refill  . acyclovir (ZOVIRAX) 400 MG tablet ONE TABLET TWICE A DAY DUE TO HERPES INFECTION (Patient taking differently: Take 400 mg by mouth daily.) 60 tablet 5  . cholecalciferol (VITAMIN D3) 25 MCG (1000 UNIT) tablet Take 1,000 Units by mouth daily.    . fish oil-omega-3 fatty acids 1000 MG capsule Take 1,000 mg by mouth daily.     . fluorometholone (FML) 0.1 % ophthalmic suspension Place 1 drop into the right eye at bedtime.     Marland Kitchen L-Methylfolate-B12-B6-B2 (METAFOLBIC) 04-28-49-5 MG TABS TAKE ONE TABLET TWICE DAILY (Patient taking differently: Take 1 tablet by mouth in the morning and at bedtime.) 60 tablet 2  . Multiple Vitamins-Minerals (MULTIVITAMIN PO) Take 1 tablet by mouth daily.     . predniSONE (DELTASONE) 5 MG tablet Take 5 mg by mouth daily. To prevent myalgias    . rosuvastatin (CRESTOR) 5 MG tablet TAKE ONE TABLET EACH DAY TO REDUCE CHOLESTEROL (Patient taking differently: Take 5 mg by mouth daily.) 90 tablet 1  . tamsulosin (FLOMAX) 0.4 MG CAPS capsule Take 0.8 mg by mouth at bedtime.     Current Facility-Administered Medications  Medication Dose Route Frequency Provider Last Rate Last Admin  . 0.9 %  sodium chloride infusion  500 mL Intravenous Once Irene Shipper, MD        REVIEW OF SYSTEMS:   Please see the history of present illness.     All other systems reviewed and are negative.  PHYSICAL EXAM:   Not perforemd   Recent Labs: 12/11/2020: BUN 19; Creatinine, Ser 1.22; Hemoglobin 13.9; Platelets 226; Potassium 4.1; Sodium 138   Recent Lipid Panel Lab Results  Component Value Date/Time   CHOL 148  09/26/2016 08:04 AM   CHOL 138 03/21/2016 08:19 AM   TRIG 71 09/26/2016 08:04 AM   HDL 52 09/26/2016 08:04 AM   HDL 56 03/21/2016 08:19 AM   CHOLHDL 2.8 09/26/2016 08:04 AM   LDLCALC 82 09/26/2016 08:04 AM   LDLCALC 68 03/21/2016 08:19 AM    Wt Readings from Last 3 Encounters:  12/11/20 175 lb (79.4 kg)  11/19/20 184 lb 12.8 oz (83.8 kg)  07/06/20 184 lb (83.5 kg)     STUDIES:   I have  reviewed the CTA c/a/p with the following results: Similar size and configuration of the ascending aorta, estimated 4.3 cm on the current CT. Also, there is similar ectasia demonstrated of the proximal descending aorta.  Redemonstration of EVAR for infrarenal abdominal aortic aneurysm with infrarenal fixation, as well as interval changes of liquid embolization of type 2 endoleak. Delayed images demonstrate evidence of persisting type 2 endoleak, with overall no significant change in the size of the excluded aneurysm sac, with representative axial dimension 5.4 cm, as above.  Redemonstration of changes of fibromuscular dysplasia, involving bilateral renal arteries and bilateral EIA.  Aortic Atherosclerosis (ICD10-I70.0) and Emphysema (ICD10-J43.9).  ASSESSMENT and PLAN   AAA:  Successful liquid embolization of a type II endoleak.  Maximum aorticdiameter is unchanged at approximately 5.4 cm.  We discussed these results over the phone.  I will have him follow up with a repeat CT scan in 1 year  TAAA:  No significant change, follow up with next CTA in 1 year    Time:   Today, I have spent 5 minutes with the patient with telehealth technology discussing the above problems.      Leia Alf, MD, FACS [facility] [phone] Pager [phone]

## 2021-02-24 ENCOUNTER — Other Ambulatory Visit (HOSPITAL_COMMUNITY): Payer: Medicare Other

## 2021-02-24 DIAGNOSIS — R0781 Pleurodynia: Secondary | ICD-10-CM | POA: Diagnosis not present

## 2021-03-01 ENCOUNTER — Ambulatory Visit: Payer: Medicare Other | Admitting: Surgery

## 2021-03-01 ENCOUNTER — Other Ambulatory Visit (HOSPITAL_COMMUNITY): Payer: Medicare Other

## 2021-03-22 ENCOUNTER — Other Ambulatory Visit (HOSPITAL_COMMUNITY): Payer: Medicare Other

## 2021-03-22 ENCOUNTER — Ambulatory Visit: Payer: Medicare Other | Admitting: Surgery

## 2021-03-24 DIAGNOSIS — Z85828 Personal history of other malignant neoplasm of skin: Secondary | ICD-10-CM | POA: Diagnosis not present

## 2021-03-24 DIAGNOSIS — D485 Neoplasm of uncertain behavior of skin: Secondary | ICD-10-CM | POA: Diagnosis not present

## 2021-03-24 DIAGNOSIS — D487 Neoplasm of uncertain behavior of other specified sites: Secondary | ICD-10-CM | POA: Diagnosis not present

## 2021-04-01 DIAGNOSIS — H34832 Tributary (branch) retinal vein occlusion, left eye, with macular edema: Secondary | ICD-10-CM | POA: Diagnosis not present

## 2021-04-27 DIAGNOSIS — E785 Hyperlipidemia, unspecified: Secondary | ICD-10-CM | POA: Diagnosis not present

## 2021-04-27 DIAGNOSIS — R972 Elevated prostate specific antigen [PSA]: Secondary | ICD-10-CM | POA: Diagnosis not present

## 2021-04-27 DIAGNOSIS — N183 Chronic kidney disease, stage 3 unspecified: Secondary | ICD-10-CM | POA: Diagnosis not present

## 2021-05-26 DIAGNOSIS — Z9842 Cataract extraction status, left eye: Secondary | ICD-10-CM | POA: Diagnosis not present

## 2021-05-26 DIAGNOSIS — H34832 Tributary (branch) retinal vein occlusion, left eye, with macular edema: Secondary | ICD-10-CM | POA: Diagnosis not present

## 2021-05-26 DIAGNOSIS — H179 Unspecified corneal scar and opacity: Secondary | ICD-10-CM | POA: Diagnosis not present

## 2021-05-26 DIAGNOSIS — Z961 Presence of intraocular lens: Secondary | ICD-10-CM | POA: Diagnosis not present

## 2021-05-26 DIAGNOSIS — Z9841 Cataract extraction status, right eye: Secondary | ICD-10-CM | POA: Diagnosis not present

## 2021-05-27 DIAGNOSIS — H34832 Tributary (branch) retinal vein occlusion, left eye, with macular edema: Secondary | ICD-10-CM | POA: Diagnosis not present

## 2021-06-21 DIAGNOSIS — H34832 Tributary (branch) retinal vein occlusion, left eye, with macular edema: Secondary | ICD-10-CM | POA: Diagnosis not present

## 2021-06-21 DIAGNOSIS — Z9842 Cataract extraction status, left eye: Secondary | ICD-10-CM | POA: Diagnosis not present

## 2021-06-21 DIAGNOSIS — H26492 Other secondary cataract, left eye: Secondary | ICD-10-CM | POA: Diagnosis not present

## 2021-06-21 DIAGNOSIS — Z961 Presence of intraocular lens: Secondary | ICD-10-CM | POA: Diagnosis not present

## 2021-06-21 DIAGNOSIS — H04202 Unspecified epiphora, left lacrimal gland: Secondary | ICD-10-CM | POA: Diagnosis not present

## 2021-06-21 DIAGNOSIS — Z9841 Cataract extraction status, right eye: Secondary | ICD-10-CM | POA: Diagnosis not present

## 2021-06-21 DIAGNOSIS — H179 Unspecified corneal scar and opacity: Secondary | ICD-10-CM | POA: Diagnosis not present

## 2021-07-05 DIAGNOSIS — H6123 Impacted cerumen, bilateral: Secondary | ICD-10-CM | POA: Diagnosis not present

## 2021-07-28 DIAGNOSIS — R972 Elevated prostate specific antigen [PSA]: Secondary | ICD-10-CM | POA: Diagnosis not present

## 2021-07-28 DIAGNOSIS — E785 Hyperlipidemia, unspecified: Secondary | ICD-10-CM | POA: Diagnosis not present

## 2021-07-28 DIAGNOSIS — N183 Chronic kidney disease, stage 3 unspecified: Secondary | ICD-10-CM | POA: Diagnosis not present

## 2021-08-16 DIAGNOSIS — M545 Low back pain, unspecified: Secondary | ICD-10-CM | POA: Diagnosis not present

## 2021-09-02 DIAGNOSIS — H34832 Tributary (branch) retinal vein occlusion, left eye, with macular edema: Secondary | ICD-10-CM | POA: Diagnosis not present

## 2021-09-02 DIAGNOSIS — H3589 Other specified retinal disorders: Secondary | ICD-10-CM | POA: Diagnosis not present

## 2021-09-17 DIAGNOSIS — E559 Vitamin D deficiency, unspecified: Secondary | ICD-10-CM | POA: Diagnosis not present

## 2021-09-17 DIAGNOSIS — Z125 Encounter for screening for malignant neoplasm of prostate: Secondary | ICD-10-CM | POA: Diagnosis not present

## 2021-09-17 DIAGNOSIS — E785 Hyperlipidemia, unspecified: Secondary | ICD-10-CM | POA: Diagnosis not present

## 2021-09-20 DIAGNOSIS — E785 Hyperlipidemia, unspecified: Secondary | ICD-10-CM | POA: Diagnosis not present

## 2021-09-20 DIAGNOSIS — R82998 Other abnormal findings in urine: Secondary | ICD-10-CM | POA: Diagnosis not present

## 2021-09-24 DIAGNOSIS — E559 Vitamin D deficiency, unspecified: Secondary | ICD-10-CM | POA: Diagnosis not present

## 2021-09-24 DIAGNOSIS — Z1339 Encounter for screening examination for other mental health and behavioral disorders: Secondary | ICD-10-CM | POA: Diagnosis not present

## 2021-09-24 DIAGNOSIS — B009 Herpesviral infection, unspecified: Secondary | ICD-10-CM | POA: Diagnosis not present

## 2021-09-24 DIAGNOSIS — I77810 Thoracic aortic ectasia: Secondary | ICD-10-CM | POA: Diagnosis not present

## 2021-09-24 DIAGNOSIS — R351 Nocturia: Secondary | ICD-10-CM | POA: Diagnosis not present

## 2021-09-24 DIAGNOSIS — Z1331 Encounter for screening for depression: Secondary | ICD-10-CM | POA: Diagnosis not present

## 2021-09-24 DIAGNOSIS — E785 Hyperlipidemia, unspecified: Secondary | ICD-10-CM | POA: Diagnosis not present

## 2021-09-24 DIAGNOSIS — Z23 Encounter for immunization: Secondary | ICD-10-CM | POA: Diagnosis not present

## 2021-09-24 DIAGNOSIS — Z0000 Encounter for general adult medical examination without abnormal findings: Secondary | ICD-10-CM | POA: Diagnosis not present

## 2021-09-24 DIAGNOSIS — M353 Polymyalgia rheumatica: Secondary | ICD-10-CM | POA: Diagnosis not present

## 2021-09-24 DIAGNOSIS — I714 Abdominal aortic aneurysm, without rupture, unspecified: Secondary | ICD-10-CM | POA: Diagnosis not present

## 2021-09-24 DIAGNOSIS — R972 Elevated prostate specific antigen [PSA]: Secondary | ICD-10-CM | POA: Diagnosis not present

## 2021-09-24 DIAGNOSIS — J8410 Pulmonary fibrosis, unspecified: Secondary | ICD-10-CM | POA: Diagnosis not present

## 2021-10-11 ENCOUNTER — Other Ambulatory Visit: Payer: Self-pay | Admitting: Orthopedic Surgery

## 2021-10-11 ENCOUNTER — Ambulatory Visit
Admission: RE | Admit: 2021-10-11 | Discharge: 2021-10-11 | Disposition: A | Discharge status: Home / Self Care | Payer: Medicare Other | Source: Ambulatory Visit | Attending: Orthopedic Surgery | Admitting: Orthopedic Surgery

## 2021-10-11 DIAGNOSIS — M545 Low back pain, unspecified: Secondary | ICD-10-CM

## 2021-10-15 DIAGNOSIS — Z23 Encounter for immunization: Secondary | ICD-10-CM | POA: Diagnosis not present

## 2021-10-15 DIAGNOSIS — M5416 Radiculopathy, lumbar region: Secondary | ICD-10-CM | POA: Diagnosis not present

## 2021-10-27 DIAGNOSIS — N183 Chronic kidney disease, stage 3 unspecified: Secondary | ICD-10-CM | POA: Diagnosis not present

## 2021-10-27 DIAGNOSIS — E785 Hyperlipidemia, unspecified: Secondary | ICD-10-CM | POA: Diagnosis not present

## 2021-10-27 DIAGNOSIS — R972 Elevated prostate specific antigen [PSA]: Secondary | ICD-10-CM | POA: Diagnosis not present

## 2021-10-28 DIAGNOSIS — H17821 Peripheral opacity of cornea, right eye: Secondary | ICD-10-CM | POA: Diagnosis not present

## 2021-10-28 DIAGNOSIS — H35371 Puckering of macula, right eye: Secondary | ICD-10-CM | POA: Diagnosis not present

## 2021-10-28 DIAGNOSIS — H34832 Tributary (branch) retinal vein occlusion, left eye, with macular edema: Secondary | ICD-10-CM | POA: Diagnosis not present

## 2021-10-28 DIAGNOSIS — Z961 Presence of intraocular lens: Secondary | ICD-10-CM | POA: Diagnosis not present

## 2021-11-03 DIAGNOSIS — M5416 Radiculopathy, lumbar region: Secondary | ICD-10-CM | POA: Diagnosis not present

## 2021-11-04 DIAGNOSIS — N401 Enlarged prostate with lower urinary tract symptoms: Secondary | ICD-10-CM | POA: Diagnosis not present

## 2021-11-04 DIAGNOSIS — R3914 Feeling of incomplete bladder emptying: Secondary | ICD-10-CM | POA: Diagnosis not present

## 2021-11-08 DIAGNOSIS — Z7952 Long term (current) use of systemic steroids: Secondary | ICD-10-CM | POA: Diagnosis not present

## 2021-11-15 DIAGNOSIS — M5416 Radiculopathy, lumbar region: Secondary | ICD-10-CM | POA: Diagnosis not present

## 2021-12-01 DIAGNOSIS — M25561 Pain in right knee: Secondary | ICD-10-CM | POA: Diagnosis not present

## 2021-12-10 DIAGNOSIS — I714 Abdominal aortic aneurysm, without rupture, unspecified: Secondary | ICD-10-CM | POA: Diagnosis not present

## 2021-12-10 DIAGNOSIS — E785 Hyperlipidemia, unspecified: Secondary | ICD-10-CM | POA: Diagnosis not present

## 2021-12-10 DIAGNOSIS — R61 Generalized hyperhidrosis: Secondary | ICD-10-CM | POA: Diagnosis not present

## 2021-12-10 DIAGNOSIS — R972 Elevated prostate specific antigen [PSA]: Secondary | ICD-10-CM | POA: Diagnosis not present

## 2021-12-10 DIAGNOSIS — M353 Polymyalgia rheumatica: Secondary | ICD-10-CM | POA: Diagnosis not present

## 2021-12-10 DIAGNOSIS — M8580 Other specified disorders of bone density and structure, unspecified site: Secondary | ICD-10-CM | POA: Diagnosis not present

## 2021-12-10 DIAGNOSIS — E559 Vitamin D deficiency, unspecified: Secondary | ICD-10-CM | POA: Diagnosis not present

## 2021-12-10 DIAGNOSIS — J8410 Pulmonary fibrosis, unspecified: Secondary | ICD-10-CM | POA: Diagnosis not present

## 2021-12-22 DIAGNOSIS — E785 Hyperlipidemia, unspecified: Secondary | ICD-10-CM | POA: Diagnosis not present

## 2021-12-22 DIAGNOSIS — E755 Other lipid storage disorders: Secondary | ICD-10-CM | POA: Diagnosis not present

## 2021-12-27 ENCOUNTER — Other Ambulatory Visit (HOSPITAL_COMMUNITY): Payer: Self-pay

## 2021-12-28 ENCOUNTER — Other Ambulatory Visit: Payer: Self-pay

## 2021-12-28 ENCOUNTER — Ambulatory Visit (HOSPITAL_COMMUNITY)
Admission: RE | Admit: 2021-12-28 | Discharge: 2021-12-28 | Disposition: A | Discharge status: Home / Self Care | Payer: Medicare Other | Source: Ambulatory Visit | Attending: Internal Medicine | Admitting: Internal Medicine

## 2021-12-28 DIAGNOSIS — M810 Age-related osteoporosis without current pathological fracture: Secondary | ICD-10-CM | POA: Diagnosis not present

## 2021-12-28 MED ORDER — DENOSUMAB 60 MG/ML ~~LOC~~ SOSY
PREFILLED_SYRINGE | SUBCUTANEOUS | Status: AC
  Administered 2021-12-28: 60 mg via SUBCUTANEOUS
  Filled 2021-12-28: qty 1

## 2021-12-28 MED ORDER — DENOSUMAB 60 MG/ML ~~LOC~~ SOSY
60.0000 mg | PREFILLED_SYRINGE | Freq: Once | SUBCUTANEOUS | Status: AC
Start: 2021-12-28 — End: 2021-12-28

## 2021-12-29 ENCOUNTER — Other Ambulatory Visit (HOSPITAL_COMMUNITY): Payer: Self-pay | Admitting: Internal Medicine

## 2021-12-29 ENCOUNTER — Other Ambulatory Visit: Payer: Self-pay | Admitting: Internal Medicine

## 2021-12-29 DIAGNOSIS — R109 Unspecified abdominal pain: Secondary | ICD-10-CM

## 2021-12-29 DIAGNOSIS — R61 Generalized hyperhidrosis: Secondary | ICD-10-CM

## 2021-12-29 DIAGNOSIS — R519 Headache, unspecified: Secondary | ICD-10-CM

## 2021-12-29 DIAGNOSIS — D72829 Elevated white blood cell count, unspecified: Secondary | ICD-10-CM | POA: Diagnosis not present

## 2021-12-29 DIAGNOSIS — R5383 Other fatigue: Secondary | ICD-10-CM

## 2021-12-29 DIAGNOSIS — M353 Polymyalgia rheumatica: Secondary | ICD-10-CM | POA: Diagnosis not present

## 2021-12-30 ENCOUNTER — Ambulatory Visit (HOSPITAL_BASED_OUTPATIENT_CLINIC_OR_DEPARTMENT_OTHER)
Admission: RE | Admit: 2021-12-30 | Discharge: 2021-12-30 | Disposition: A | Discharge status: Home / Self Care | Payer: Medicare Other | Source: Ambulatory Visit | Attending: Internal Medicine | Admitting: Internal Medicine

## 2021-12-30 ENCOUNTER — Other Ambulatory Visit: Payer: Self-pay

## 2021-12-30 DIAGNOSIS — N281 Cyst of kidney, acquired: Secondary | ICD-10-CM | POA: Diagnosis not present

## 2021-12-30 DIAGNOSIS — H34832 Tributary (branch) retinal vein occlusion, left eye, with macular edema: Secondary | ICD-10-CM | POA: Diagnosis not present

## 2021-12-30 DIAGNOSIS — R5383 Other fatigue: Secondary | ICD-10-CM

## 2021-12-30 DIAGNOSIS — I714 Abdominal aortic aneurysm, without rupture, unspecified: Secondary | ICD-10-CM | POA: Diagnosis not present

## 2021-12-30 DIAGNOSIS — R61 Generalized hyperhidrosis: Secondary | ICD-10-CM | POA: Diagnosis not present

## 2021-12-30 DIAGNOSIS — R109 Unspecified abdominal pain: Secondary | ICD-10-CM

## 2021-12-30 DIAGNOSIS — R918 Other nonspecific abnormal finding of lung field: Secondary | ICD-10-CM | POA: Diagnosis not present

## 2021-12-30 DIAGNOSIS — Z961 Presence of intraocular lens: Secondary | ICD-10-CM | POA: Diagnosis not present

## 2021-12-30 DIAGNOSIS — R519 Headache, unspecified: Secondary | ICD-10-CM | POA: Diagnosis not present

## 2021-12-30 DIAGNOSIS — H6123 Impacted cerumen, bilateral: Secondary | ICD-10-CM | POA: Diagnosis not present

## 2021-12-30 DIAGNOSIS — N3289 Other specified disorders of bladder: Secondary | ICD-10-CM | POA: Diagnosis not present

## 2021-12-30 MED ORDER — IOHEXOL 300 MG/ML  SOLN
85.0000 mL | Freq: Once | INTRAMUSCULAR | Status: AC | PRN
  Administered 2021-12-30: 85 mL via INTRAVENOUS

## 2022-01-11 DIAGNOSIS — Z85828 Personal history of other malignant neoplasm of skin: Secondary | ICD-10-CM | POA: Diagnosis not present

## 2022-01-11 DIAGNOSIS — L821 Other seborrheic keratosis: Secondary | ICD-10-CM | POA: Diagnosis not present

## 2022-03-01 DIAGNOSIS — M545 Low back pain, unspecified: Secondary | ICD-10-CM | POA: Diagnosis not present

## 2022-03-01 DIAGNOSIS — M79662 Pain in left lower leg: Secondary | ICD-10-CM | POA: Diagnosis not present

## 2022-03-03 DIAGNOSIS — Z961 Presence of intraocular lens: Secondary | ICD-10-CM | POA: Diagnosis not present

## 2022-03-03 DIAGNOSIS — H34832 Tributary (branch) retinal vein occlusion, left eye, with macular edema: Secondary | ICD-10-CM | POA: Diagnosis not present

## 2022-03-24 DIAGNOSIS — R61 Generalized hyperhidrosis: Secondary | ICD-10-CM | POA: Diagnosis not present

## 2022-03-24 DIAGNOSIS — M353 Polymyalgia rheumatica: Secondary | ICD-10-CM | POA: Diagnosis not present

## 2022-03-24 DIAGNOSIS — M5416 Radiculopathy, lumbar region: Secondary | ICD-10-CM | POA: Diagnosis not present

## 2022-03-24 DIAGNOSIS — R079 Chest pain, unspecified: Secondary | ICD-10-CM | POA: Diagnosis not present

## 2022-04-19 DIAGNOSIS — R351 Nocturia: Secondary | ICD-10-CM | POA: Diagnosis not present

## 2022-04-19 DIAGNOSIS — R3914 Feeling of incomplete bladder emptying: Secondary | ICD-10-CM | POA: Diagnosis not present

## 2022-04-19 DIAGNOSIS — N401 Enlarged prostate with lower urinary tract symptoms: Secondary | ICD-10-CM | POA: Diagnosis not present

## 2022-04-27 ENCOUNTER — Encounter (HOSPITAL_COMMUNITY): Payer: Medicare Other

## 2022-04-27 DIAGNOSIS — E785 Hyperlipidemia, unspecified: Secondary | ICD-10-CM | POA: Diagnosis not present

## 2022-04-27 DIAGNOSIS — M353 Polymyalgia rheumatica: Secondary | ICD-10-CM | POA: Diagnosis not present

## 2022-05-10 ENCOUNTER — Encounter (HOSPITAL_COMMUNITY): Payer: Medicare Other

## 2022-05-12 DIAGNOSIS — M5134 Other intervertebral disc degeneration, thoracic region: Secondary | ICD-10-CM | POA: Diagnosis not present

## 2022-05-12 DIAGNOSIS — M7918 Myalgia, other site: Secondary | ICD-10-CM | POA: Diagnosis not present

## 2022-05-12 DIAGNOSIS — R531 Weakness: Secondary | ICD-10-CM | POA: Diagnosis not present

## 2022-05-12 DIAGNOSIS — M6281 Muscle weakness (generalized): Secondary | ICD-10-CM | POA: Diagnosis not present

## 2022-05-13 ENCOUNTER — Other Ambulatory Visit (HOSPITAL_COMMUNITY): Payer: Self-pay | Admitting: *Deleted

## 2022-05-16 ENCOUNTER — Encounter (HOSPITAL_COMMUNITY): Payer: Self-pay

## 2022-05-16 ENCOUNTER — Ambulatory Visit (HOSPITAL_COMMUNITY)
Admission: RE | Admit: 2022-05-16 | Discharge: 2022-05-16 | Disposition: A | Discharge status: Home / Self Care | Payer: Medicare Other | Source: Ambulatory Visit | Attending: Internal Medicine | Admitting: Internal Medicine

## 2022-05-16 DIAGNOSIS — M810 Age-related osteoporosis without current pathological fracture: Secondary | ICD-10-CM | POA: Insufficient documentation

## 2022-05-16 MED ORDER — DENOSUMAB 60 MG/ML ~~LOC~~ SOSY: 60.0000 mg | PREFILLED_SYRINGE | Freq: Once | SUBCUTANEOUS | Status: DC

## 2022-05-20 DIAGNOSIS — R202 Paresthesia of skin: Secondary | ICD-10-CM | POA: Diagnosis not present

## 2022-05-20 DIAGNOSIS — N3281 Overactive bladder: Secondary | ICD-10-CM | POA: Diagnosis not present

## 2022-05-20 DIAGNOSIS — I714 Abdominal aortic aneurysm, without rupture, unspecified: Secondary | ICD-10-CM | POA: Diagnosis not present

## 2022-05-20 DIAGNOSIS — E785 Hyperlipidemia, unspecified: Secondary | ICD-10-CM | POA: Diagnosis not present

## 2022-05-20 DIAGNOSIS — J8410 Pulmonary fibrosis, unspecified: Secondary | ICD-10-CM | POA: Diagnosis not present

## 2022-05-20 DIAGNOSIS — R011 Cardiac murmur, unspecified: Secondary | ICD-10-CM | POA: Diagnosis not present

## 2022-05-20 DIAGNOSIS — M353 Polymyalgia rheumatica: Secondary | ICD-10-CM | POA: Diagnosis not present

## 2022-05-20 DIAGNOSIS — R5383 Other fatigue: Secondary | ICD-10-CM | POA: Diagnosis not present

## 2022-05-20 DIAGNOSIS — D72829 Elevated white blood cell count, unspecified: Secondary | ICD-10-CM | POA: Diagnosis not present

## 2022-05-20 DIAGNOSIS — I77810 Thoracic aortic ectasia: Secondary | ICD-10-CM | POA: Diagnosis not present

## 2022-05-26 DIAGNOSIS — H34832 Tributary (branch) retinal vein occlusion, left eye, with macular edema: Secondary | ICD-10-CM | POA: Diagnosis not present

## 2022-05-26 DIAGNOSIS — H3589 Other specified retinal disorders: Secondary | ICD-10-CM | POA: Diagnosis not present

## 2022-05-27 ENCOUNTER — Other Ambulatory Visit: Payer: Self-pay

## 2022-05-27 ENCOUNTER — Other Ambulatory Visit (HOSPITAL_COMMUNITY): Payer: Self-pay | Admitting: Internal Medicine

## 2022-05-27 DIAGNOSIS — I712 Thoracic aortic aneurysm, without rupture, unspecified: Secondary | ICD-10-CM

## 2022-05-27 DIAGNOSIS — R011 Cardiac murmur, unspecified: Secondary | ICD-10-CM

## 2022-05-27 DIAGNOSIS — I714 Abdominal aortic aneurysm, without rupture, unspecified: Secondary | ICD-10-CM

## 2022-05-28 ENCOUNTER — Ambulatory Visit (HOSPITAL_COMMUNITY)
Admission: RE | Admit: 2022-05-28 | Discharge: 2022-05-28 | Disposition: A | Discharge status: Home / Self Care | Payer: Medicare Other | Source: Ambulatory Visit | Attending: Surgery | Admitting: Surgery

## 2022-05-28 ENCOUNTER — Ambulatory Visit (HOSPITAL_COMMUNITY): Admission: RE | Admit: 2022-05-28 | Payer: Medicare Other | Source: Ambulatory Visit

## 2022-05-28 DIAGNOSIS — I714 Abdominal aortic aneurysm, without rupture, unspecified: Secondary | ICD-10-CM | POA: Diagnosis not present

## 2022-05-28 DIAGNOSIS — I7143 Infrarenal abdominal aortic aneurysm, without rupture: Secondary | ICD-10-CM | POA: Diagnosis not present

## 2022-05-28 DIAGNOSIS — I712 Thoracic aortic aneurysm, without rupture, unspecified: Secondary | ICD-10-CM | POA: Diagnosis not present

## 2022-05-28 MED ORDER — SODIUM CHLORIDE (PF) 0.9 % IJ SOLN
INTRAMUSCULAR | Status: AC
  Filled 2022-05-28: qty 50

## 2022-05-28 MED ORDER — IOHEXOL 350 MG/ML SOLN
125.0000 mL | Freq: Once | INTRAVENOUS | Status: AC | PRN
  Administered 2022-05-28: 125 mL via INTRAVENOUS

## 2022-05-30 ENCOUNTER — Telehealth: Payer: Self-pay | Admitting: Surgery

## 2022-05-30 NOTE — Telephone Encounter (Signed)
Spoke with patient about his CT scan results.  He will need a repeat CT scan in 6 months to follow up on his thoracic aneurysm  Annamarie Major

## 2022-06-06 ENCOUNTER — Ambulatory Visit: Payer: Medicare Other | Admitting: Surgery

## 2022-06-07 DIAGNOSIS — M353 Polymyalgia rheumatica: Secondary | ICD-10-CM | POA: Diagnosis not present

## 2022-06-07 DIAGNOSIS — E2740 Unspecified adrenocortical insufficiency: Secondary | ICD-10-CM | POA: Diagnosis not present

## 2022-06-08 ENCOUNTER — Ambulatory Visit (HOSPITAL_COMMUNITY): Payer: Medicare Other | Attending: Internal Medicine

## 2022-06-08 DIAGNOSIS — R011 Cardiac murmur, unspecified: Secondary | ICD-10-CM | POA: Insufficient documentation

## 2022-06-08 LAB — ECHOCARDIOGRAM COMPLETE
Area-P 1/2: 3.21 cm2
S' Lateral: 2.4 cm

## 2022-06-24 ENCOUNTER — Other Ambulatory Visit (HOSPITAL_COMMUNITY): Payer: Self-pay | Admitting: *Deleted

## 2022-06-27 ENCOUNTER — Ambulatory Visit (HOSPITAL_COMMUNITY)
Admission: RE | Admit: 2022-06-27 | Discharge: 2022-06-27 | Disposition: A | Discharge status: Home / Self Care | Payer: Medicare Other | Source: Ambulatory Visit | Attending: Internal Medicine | Admitting: Internal Medicine

## 2022-06-27 DIAGNOSIS — M810 Age-related osteoporosis without current pathological fracture: Secondary | ICD-10-CM | POA: Insufficient documentation

## 2022-06-27 MED ORDER — DENOSUMAB 60 MG/ML ~~LOC~~ SOSY
60.0000 mg | PREFILLED_SYRINGE | Freq: Once | SUBCUTANEOUS | Status: AC
  Administered 2022-06-27: 60 mg via SUBCUTANEOUS

## 2022-06-27 MED ORDER — DENOSUMAB 60 MG/ML ~~LOC~~ SOSY
PREFILLED_SYRINGE | SUBCUTANEOUS | Status: AC
  Filled 2022-06-27: qty 1

## 2022-06-29 DIAGNOSIS — R634 Abnormal weight loss: Secondary | ICD-10-CM | POA: Diagnosis not present

## 2022-06-29 DIAGNOSIS — M353 Polymyalgia rheumatica: Secondary | ICD-10-CM | POA: Diagnosis not present

## 2022-06-29 DIAGNOSIS — R5383 Other fatigue: Secondary | ICD-10-CM | POA: Diagnosis not present

## 2022-06-29 DIAGNOSIS — Z6823 Body mass index (BMI) 23.0-23.9, adult: Secondary | ICD-10-CM | POA: Diagnosis not present

## 2022-06-30 DIAGNOSIS — H6123 Impacted cerumen, bilateral: Secondary | ICD-10-CM | POA: Diagnosis not present

## 2022-07-05 DIAGNOSIS — M353 Polymyalgia rheumatica: Secondary | ICD-10-CM | POA: Diagnosis not present

## 2022-07-05 DIAGNOSIS — Z6824 Body mass index (BMI) 24.0-24.9, adult: Secondary | ICD-10-CM | POA: Diagnosis not present

## 2022-07-05 DIAGNOSIS — Z79899 Other long term (current) drug therapy: Secondary | ICD-10-CM | POA: Diagnosis not present

## 2022-07-05 DIAGNOSIS — R5383 Other fatigue: Secondary | ICD-10-CM | POA: Diagnosis not present

## 2022-07-06 DIAGNOSIS — L570 Actinic keratosis: Secondary | ICD-10-CM | POA: Diagnosis not present

## 2022-07-06 DIAGNOSIS — D1801 Hemangioma of skin and subcutaneous tissue: Secondary | ICD-10-CM | POA: Diagnosis not present

## 2022-07-06 DIAGNOSIS — D225 Melanocytic nevi of trunk: Secondary | ICD-10-CM | POA: Diagnosis not present

## 2022-07-06 DIAGNOSIS — D2262 Melanocytic nevi of left upper limb, including shoulder: Secondary | ICD-10-CM | POA: Diagnosis not present

## 2022-07-06 DIAGNOSIS — L821 Other seborrheic keratosis: Secondary | ICD-10-CM | POA: Diagnosis not present

## 2022-07-06 DIAGNOSIS — Z85828 Personal history of other malignant neoplasm of skin: Secondary | ICD-10-CM | POA: Diagnosis not present

## 2022-07-07 ENCOUNTER — Institutional Professional Consult (permissible substitution): Payer: Medicare Other | Admitting: Neurology

## 2022-07-14 DIAGNOSIS — H34832 Tributary (branch) retinal vein occlusion, left eye, with macular edema: Secondary | ICD-10-CM | POA: Diagnosis not present

## 2022-07-14 DIAGNOSIS — Z961 Presence of intraocular lens: Secondary | ICD-10-CM | POA: Diagnosis not present

## 2022-07-19 DIAGNOSIS — R3914 Feeling of incomplete bladder emptying: Secondary | ICD-10-CM | POA: Diagnosis not present

## 2022-07-19 DIAGNOSIS — R351 Nocturia: Secondary | ICD-10-CM | POA: Diagnosis not present

## 2022-07-19 DIAGNOSIS — N401 Enlarged prostate with lower urinary tract symptoms: Secondary | ICD-10-CM | POA: Diagnosis not present

## 2022-07-24 IMAGING — CT CT CTA ABD/PEL W/CM AND/OR W/O CM
1 of 5 series · 11 of 32 positions shown, 16 images · IV contrast (APPLIED)
Comparison: Prior CTA chest, abdomen and pelvis 12/12/2019

CLINICAL DATA: 89-year-old male with a thoracic and abdominal
aortic aneurysm status post endovascular aortic repair of the
abdominal aortic aneurysm.

EXAM:
CT ANGIOGRAPHY CHEST, ABDOMEN AND PELVIS
TECHNIQUE: Non-contrast CT of the chest was initially obtained.

[Series 6: angio · axial · 0.80mm/px · z∈[-645,-47]mm · 11 of 343 slices shown, 16 images]
[im 22/343  soft-tissue]
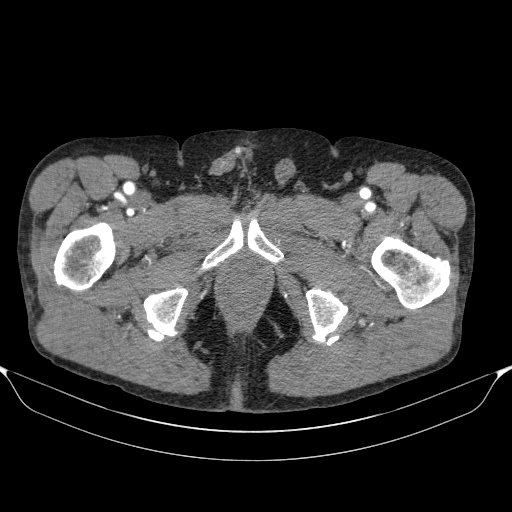
[im 22/343  bone]
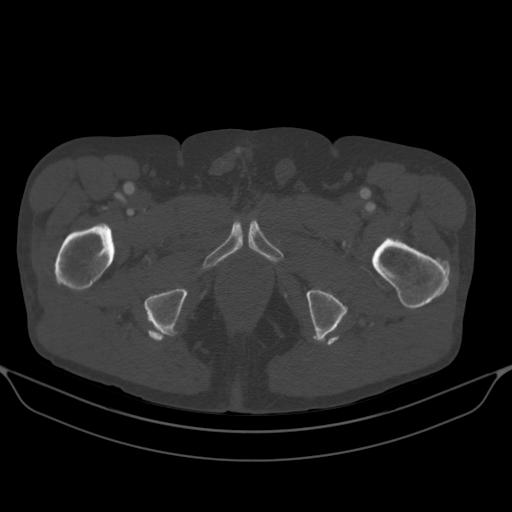
[im 65/343  soft-tissue]
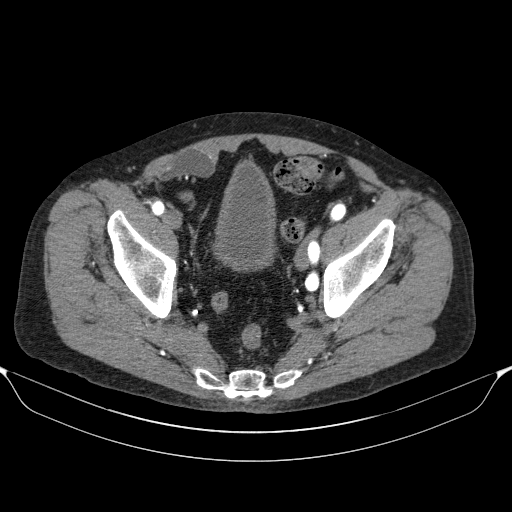
[im 86/343  soft-tissue]
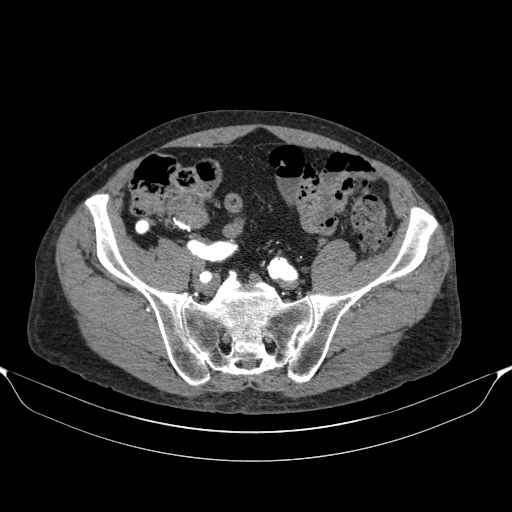
[im 129/343  soft-tissue]
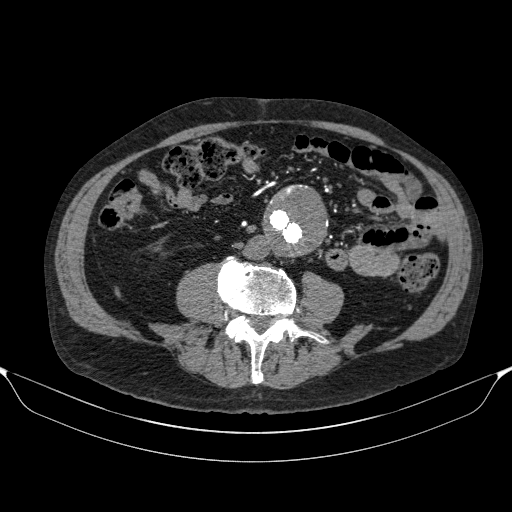
[im 150/343  soft-tissue]
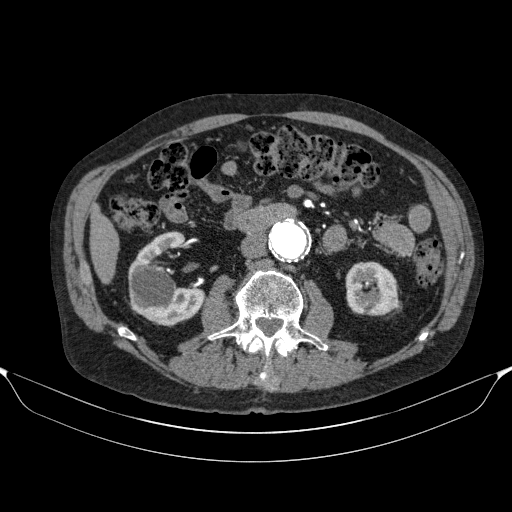
[im 193/343  soft-tissue]
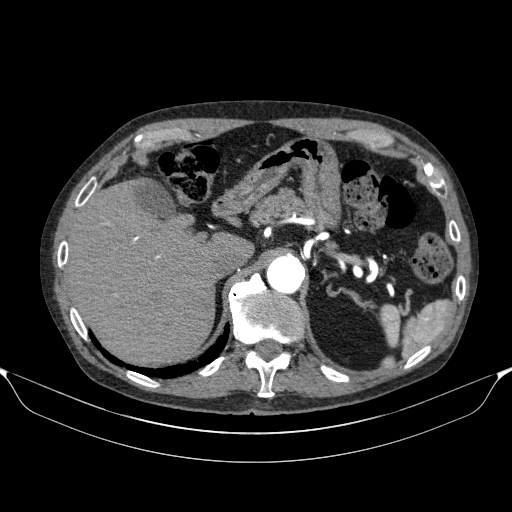
[im 214/343  soft-tissue]
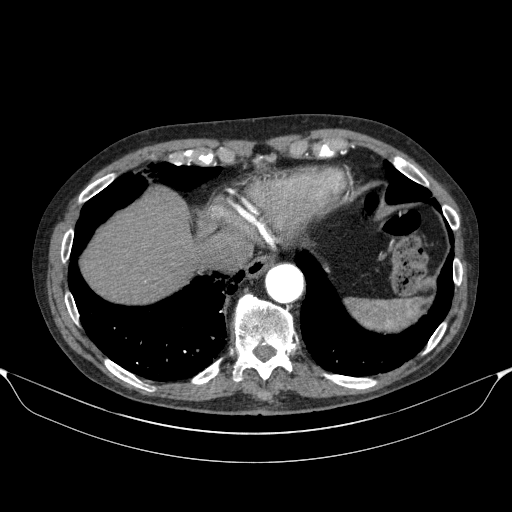
[im 257/343  soft-tissue]
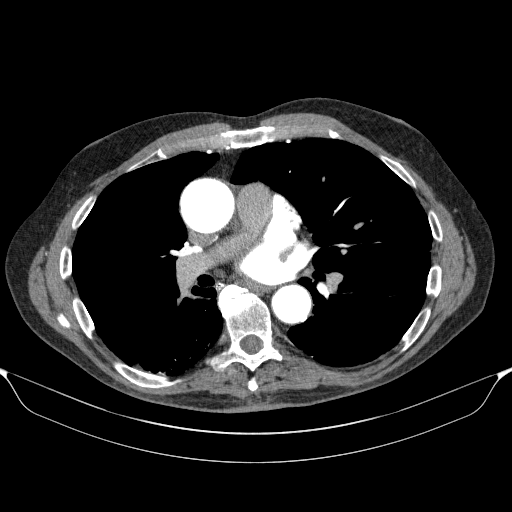
[im 257/343  lung]
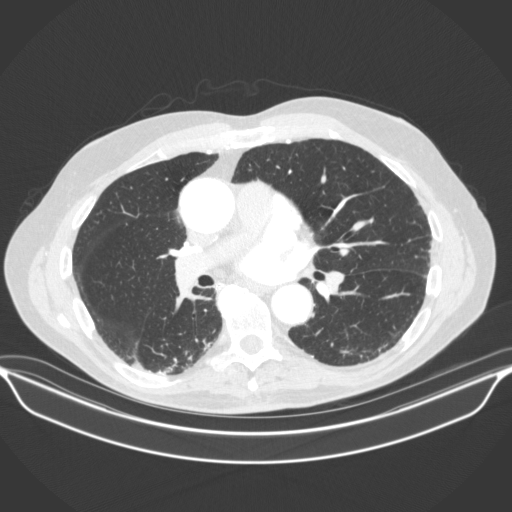
[im 278/343  soft-tissue]
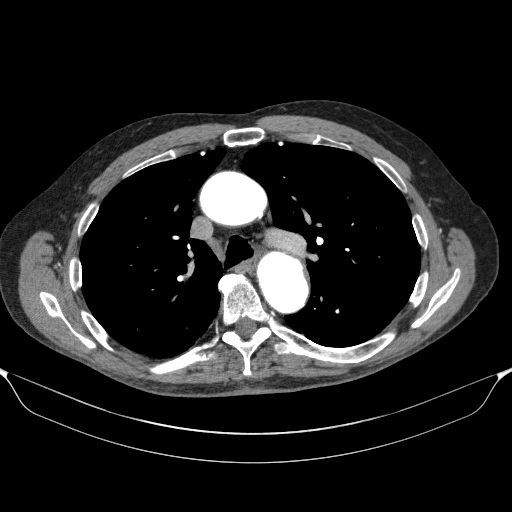
[im 278/343  lung]
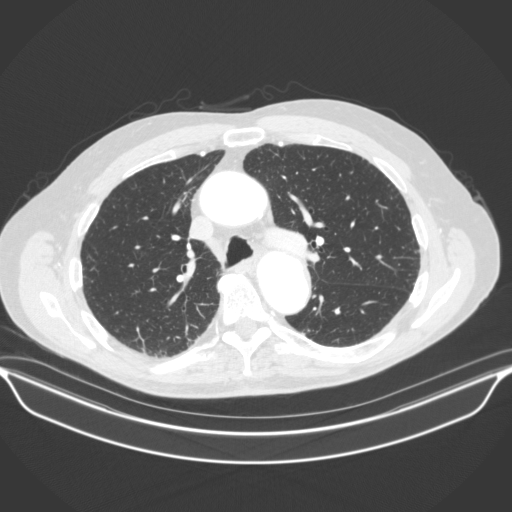
[im 278/343  bone]
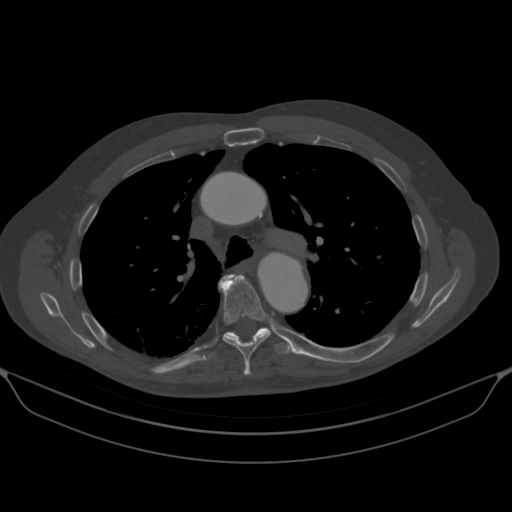
[im 300/343  lung]
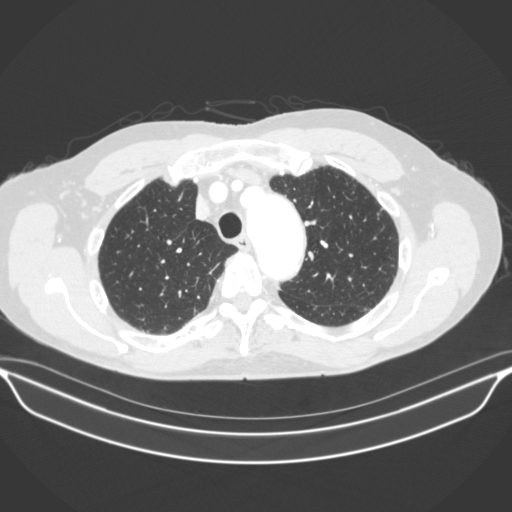
[im 321/343  soft-tissue]
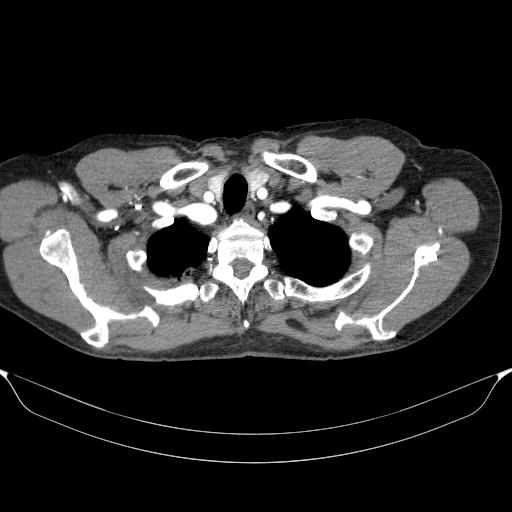
[im 321/343  lung]
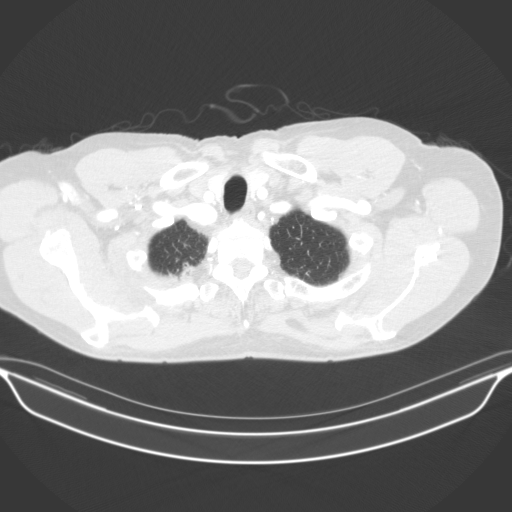

[11 of 32 positions shown; findings below may reference images not displayed]

Multidetector CT imaging through the chest, abdomen and pelvis was
performed using the standard protocol during bolus administration of
intravenous contrast. Multiplanar reconstructed images and MIPs were
obtained and reviewed to evaluate the vascular anatomy.

CONTRAST:  75mL EWN2O3-EH4 IOPAMIDOL (EWN2O3-EH4) INJECTION 76%

Creatinine was obtained on site at [HOSPITAL] at [HOSPITAL].

Results: Creatinine 1.4 mg/dL.
FINDINGS: CTA CHEST FINDINGS

Cardiovascular: Initial noncontrast enhanced CT imaging demonstrates
no evidence of acute intramural hematoma. Mild fusiform aneurysmal
dilation of the tubular portion of the ascending thoracic aorta is
noted. The maximal aortic diameter remains stable at 4.4 cm. The
aortic root remains normal in caliber. No effacement of the
Merla junction. Similar degree of ectasias of the proximal
descending thoracic aorta with a maximal transverse diameter of
cm. The heart remains normal in size. Calcifications again noted
along the coronary arteries. No pericardial effusion.

Mediastinum/Nodes: Unremarkable CT appearance of the thyroid gland.
No suspicious mediastinal or hilar adenopathy. No soft tissue
mediastinal mass. The thoracic esophagus is unremarkable.

Lungs/Pleura: Stable mild biapical pleuroparenchymal scarring.
Peripheral subpleural reticulation and architectural distortion in
the lower lobes is similar compared to prior. No new pulmonary
nodule, focal airspace consolidation, pulmonary edema, pleural
effusion or pneumothorax.

Musculoskeletal: No acute fracture or aggressive appearing lytic or
blastic osseous lesion.

Review of the MIP images confirms the above findings.

CTA ABDOMEN AND PELVIS FINDINGS

VASCULAR

Aorta: Abdominal aortic aneurysm status post endovascular aortic
repair with a bifurcated endoprosthesis extending from just below
the renal arteries into the common iliac arteries bilaterally.
Persistent diffuse delayed enhancement throughout the excluded
aneurysm sac on the delayed phase images. The excluded aneurysm sac
continues to enlarge presently measuring 5.8 x 5.4 cm compared to
5.6 x 5.3 cm previously.

Celiac: Patent without evidence of aneurysm, dissection, vasculitis
or significant stenosis.

SMA: Patent without evidence of aneurysm, dissection, vasculitis or
significant stenosis.

Renals: Solitary right renal artery is unremarkable. No evidence of
stenosis, significant atherosclerotic plaque or fibromuscular
dysplasia. There are 2 left-sided renal arteries. Both renal
arteries demonstrate an irregular and beaded appearance most
consistent with fibromuscular dysplasia. There is a moderate to
high-grade stenosis in the proximal left inferior renal artery.

IMA: Patent.

Inflow: Stable ectasia of the bilateral common iliac arteries
measuring up to 2.1 cm on the right and 2.2 cm on the left. The
internal and external iliac arteries are ectatic but not aneurysmal.
Multifocal areas of mild beading consistent with underlying
fibromuscular dysplasia.

Veins: No focal venous abnormality.

Review of the MIP images confirms the above findings.

NON-VASCULAR

Hepatobiliary: Circumscribed low-attenuation hepatic lesions
demonstrate no significant interval change in remain consistent with
simple cysts. Gallbladder is unremarkable. No intra or extrahepatic
biliary ductal dilatation.

Pancreas: Unremarkable. No pancreatic ductal dilatation or
surrounding inflammatory changes.

Spleen: Normal in size without focal abnormality.

Adrenals/Urinary Tract: Unremarkable adrenal glands. No enhancing
renal lesion, nephrolithiasis or hydronephrosis. Stable 3.4 cm
simple cyst in the interpolar right kidney. Renal size discrepancy,
the left kidney is slightly smaller than the right.

Stomach/Bowel: No focal bowel wall thickening or evidence of
obstruction. Retrocecal appendix noted incidentally.

Lymphatic: No suspicious lymphadenopathy.

Reproductive: Prostatomegaly. Penile pump prosthesis present.

Other: No abdominal wall hernia or abnormality. No abdominopelvic
ascites.

Musculoskeletal: No acute fracture or aggressive appearing lytic or
blastic osseous lesion. Multilevel degenerative disc disease.
IMPRESSION: CTA CHEST

1. Stable fusiform aneurysmal dilation of the ascending and proximal
descending thoracic aorta. Maximal ascending thoracic aortic
diameter 4.4 cm. Recommend annual imaging followup by CTA or MRA.
This recommendation follows 9717
ACCF/AHA/AATS/ACR/ASA/SCA/VEDELINUS/KHADIZA AKTER/MASATAKA/TEDDY Guidelines for the
Diagnosis and Management of Patients with Thoracic Aortic Disease.
Circulation. 9717; 121: [id number]. Aortic aneurysm NOS ([id number])
2. Additional ancillary findings as above without interval change.

CTA ABD/PELVIS

1. Abdominal aortic aneurysm status Alzeneide Naira with delayed type 2
endoleak with continued slow enlargement of the excluded aneurysm
sac. The maximal sac diameter is now 5.8 x 5.4 cm compared to 5.6 x
5.3 cm in Friday November, 2019 and 5.2 x 4.9 cm in Sunday July, 2018.
2. Additional ancillary findings as above without significant
interval change.

## 2022-08-02 DIAGNOSIS — M353 Polymyalgia rheumatica: Secondary | ICD-10-CM | POA: Diagnosis not present

## 2022-08-02 DIAGNOSIS — Z6823 Body mass index (BMI) 23.0-23.9, adult: Secondary | ICD-10-CM | POA: Diagnosis not present

## 2022-08-02 DIAGNOSIS — Z79899 Other long term (current) drug therapy: Secondary | ICD-10-CM | POA: Diagnosis not present

## 2022-08-02 DIAGNOSIS — R5383 Other fatigue: Secondary | ICD-10-CM | POA: Diagnosis not present

## 2022-08-18 DIAGNOSIS — M5442 Lumbago with sciatica, left side: Secondary | ICD-10-CM | POA: Diagnosis not present

## 2022-08-18 DIAGNOSIS — M7918 Myalgia, other site: Secondary | ICD-10-CM | POA: Diagnosis not present

## 2022-08-31 DIAGNOSIS — Z23 Encounter for immunization: Secondary | ICD-10-CM | POA: Diagnosis not present

## 2022-08-31 DIAGNOSIS — M353 Polymyalgia rheumatica: Secondary | ICD-10-CM | POA: Diagnosis not present

## 2022-08-31 DIAGNOSIS — E2740 Unspecified adrenocortical insufficiency: Secondary | ICD-10-CM | POA: Diagnosis not present

## 2022-09-01 DIAGNOSIS — M5416 Radiculopathy, lumbar region: Secondary | ICD-10-CM | POA: Diagnosis not present

## 2022-09-12 ENCOUNTER — Ambulatory Visit
Admission: RE | Admit: 2022-09-12 | Discharge: 2022-09-12 | Disposition: A | Discharge status: Home / Self Care | Payer: Medicare Other | Source: Ambulatory Visit | Attending: Orthopedic Surgery | Admitting: Orthopedic Surgery

## 2022-09-12 ENCOUNTER — Other Ambulatory Visit: Payer: Self-pay | Admitting: Orthopedic Surgery

## 2022-09-12 DIAGNOSIS — M545 Low back pain, unspecified: Secondary | ICD-10-CM

## 2022-09-12 DIAGNOSIS — M79605 Pain in left leg: Secondary | ICD-10-CM | POA: Diagnosis not present

## 2022-09-12 DIAGNOSIS — M48061 Spinal stenosis, lumbar region without neurogenic claudication: Secondary | ICD-10-CM | POA: Diagnosis not present

## 2022-09-15 DIAGNOSIS — Z961 Presence of intraocular lens: Secondary | ICD-10-CM | POA: Diagnosis not present

## 2022-09-15 DIAGNOSIS — H34832 Tributary (branch) retinal vein occlusion, left eye, with macular edema: Secondary | ICD-10-CM | POA: Diagnosis not present

## 2022-09-16 DIAGNOSIS — M5416 Radiculopathy, lumbar region: Secondary | ICD-10-CM | POA: Diagnosis not present

## 2022-10-14 DIAGNOSIS — E785 Hyperlipidemia, unspecified: Secondary | ICD-10-CM | POA: Diagnosis not present

## 2022-10-14 DIAGNOSIS — R5383 Other fatigue: Secondary | ICD-10-CM | POA: Diagnosis not present

## 2022-10-14 DIAGNOSIS — R7989 Other specified abnormal findings of blood chemistry: Secondary | ICD-10-CM | POA: Diagnosis not present

## 2022-10-14 DIAGNOSIS — E559 Vitamin D deficiency, unspecified: Secondary | ICD-10-CM | POA: Diagnosis not present

## 2022-10-14 DIAGNOSIS — Z125 Encounter for screening for malignant neoplasm of prostate: Secondary | ICD-10-CM | POA: Diagnosis not present

## 2022-10-17 DIAGNOSIS — M2560 Stiffness of unspecified joint, not elsewhere classified: Secondary | ICD-10-CM | POA: Diagnosis not present

## 2022-10-17 DIAGNOSIS — Z6823 Body mass index (BMI) 23.0-23.9, adult: Secondary | ICD-10-CM | POA: Diagnosis not present

## 2022-10-17 DIAGNOSIS — R5383 Other fatigue: Secondary | ICD-10-CM | POA: Diagnosis not present

## 2022-10-17 DIAGNOSIS — M353 Polymyalgia rheumatica: Secondary | ICD-10-CM | POA: Diagnosis not present

## 2022-10-17 DIAGNOSIS — Z79899 Other long term (current) drug therapy: Secondary | ICD-10-CM | POA: Diagnosis not present

## 2022-10-26 DIAGNOSIS — E2740 Unspecified adrenocortical insufficiency: Secondary | ICD-10-CM | POA: Diagnosis not present

## 2022-10-26 DIAGNOSIS — D72829 Elevated white blood cell count, unspecified: Secondary | ICD-10-CM | POA: Diagnosis not present

## 2022-10-26 DIAGNOSIS — M353 Polymyalgia rheumatica: Secondary | ICD-10-CM | POA: Diagnosis not present

## 2022-10-26 DIAGNOSIS — I77810 Thoracic aortic ectasia: Secondary | ICD-10-CM | POA: Diagnosis not present

## 2022-10-26 DIAGNOSIS — R972 Elevated prostate specific antigen [PSA]: Secondary | ICD-10-CM | POA: Diagnosis not present

## 2022-10-26 DIAGNOSIS — E785 Hyperlipidemia, unspecified: Secondary | ICD-10-CM | POA: Diagnosis not present

## 2022-10-26 DIAGNOSIS — R82998 Other abnormal findings in urine: Secondary | ICD-10-CM | POA: Diagnosis not present

## 2022-10-26 DIAGNOSIS — M8580 Other specified disorders of bone density and structure, unspecified site: Secondary | ICD-10-CM | POA: Diagnosis not present

## 2022-10-26 DIAGNOSIS — Z1339 Encounter for screening examination for other mental health and behavioral disorders: Secondary | ICD-10-CM | POA: Diagnosis not present

## 2022-10-26 DIAGNOSIS — Z23 Encounter for immunization: Secondary | ICD-10-CM | POA: Diagnosis not present

## 2022-10-26 DIAGNOSIS — I714 Abdominal aortic aneurysm, without rupture, unspecified: Secondary | ICD-10-CM | POA: Diagnosis not present

## 2022-10-26 DIAGNOSIS — Z0000 Encounter for general adult medical examination without abnormal findings: Secondary | ICD-10-CM | POA: Diagnosis not present

## 2022-10-26 DIAGNOSIS — Z1331 Encounter for screening for depression: Secondary | ICD-10-CM | POA: Diagnosis not present

## 2022-10-26 DIAGNOSIS — E559 Vitamin D deficiency, unspecified: Secondary | ICD-10-CM | POA: Diagnosis not present

## 2022-10-26 DIAGNOSIS — R011 Cardiac murmur, unspecified: Secondary | ICD-10-CM | POA: Diagnosis not present

## 2022-10-31 DIAGNOSIS — M5416 Radiculopathy, lumbar region: Secondary | ICD-10-CM | POA: Diagnosis not present

## 2022-11-03 DIAGNOSIS — H34832 Tributary (branch) retinal vein occlusion, left eye, with macular edema: Secondary | ICD-10-CM | POA: Diagnosis not present

## 2022-11-03 DIAGNOSIS — Z961 Presence of intraocular lens: Secondary | ICD-10-CM | POA: Diagnosis not present

## 2022-11-04 DIAGNOSIS — R5383 Other fatigue: Secondary | ICD-10-CM | POA: Diagnosis not present

## 2022-11-04 DIAGNOSIS — E291 Testicular hypofunction: Secondary | ICD-10-CM | POA: Diagnosis not present

## 2022-11-04 DIAGNOSIS — J8410 Pulmonary fibrosis, unspecified: Secondary | ICD-10-CM | POA: Diagnosis not present

## 2022-11-04 DIAGNOSIS — N3281 Overactive bladder: Secondary | ICD-10-CM | POA: Diagnosis not present

## 2022-11-04 DIAGNOSIS — M5430 Sciatica, unspecified side: Secondary | ICD-10-CM | POA: Diagnosis not present

## 2022-11-04 DIAGNOSIS — E785 Hyperlipidemia, unspecified: Secondary | ICD-10-CM | POA: Diagnosis not present

## 2022-11-04 DIAGNOSIS — E2740 Unspecified adrenocortical insufficiency: Secondary | ICD-10-CM | POA: Diagnosis not present

## 2022-11-04 DIAGNOSIS — M353 Polymyalgia rheumatica: Secondary | ICD-10-CM | POA: Diagnosis not present

## 2022-11-16 DIAGNOSIS — M353 Polymyalgia rheumatica: Secondary | ICD-10-CM | POA: Diagnosis not present

## 2022-11-18 ENCOUNTER — Other Ambulatory Visit: Payer: Self-pay

## 2022-11-18 DIAGNOSIS — I714 Abdominal aortic aneurysm, without rupture, unspecified: Secondary | ICD-10-CM

## 2022-11-24 DIAGNOSIS — M353 Polymyalgia rheumatica: Secondary | ICD-10-CM | POA: Diagnosis not present

## 2022-11-24 DIAGNOSIS — Z6823 Body mass index (BMI) 23.0-23.9, adult: Secondary | ICD-10-CM | POA: Diagnosis not present

## 2022-11-24 DIAGNOSIS — R5383 Other fatigue: Secondary | ICD-10-CM | POA: Diagnosis not present

## 2022-11-24 DIAGNOSIS — Z79899 Other long term (current) drug therapy: Secondary | ICD-10-CM | POA: Diagnosis not present

## 2022-11-24 DIAGNOSIS — M2560 Stiffness of unspecified joint, not elsewhere classified: Secondary | ICD-10-CM | POA: Diagnosis not present

## 2022-11-25 ENCOUNTER — Other Ambulatory Visit: Payer: Self-pay | Admitting: *Deleted

## 2022-11-25 DIAGNOSIS — M79604 Pain in right leg: Secondary | ICD-10-CM

## 2022-12-02 ENCOUNTER — Ambulatory Visit (HOSPITAL_COMMUNITY)
Admission: RE | Admit: 2022-12-02 | Discharge: 2022-12-02 | Disposition: A | Discharge status: Home / Self Care | Payer: Medicare Other | Source: Ambulatory Visit | Attending: Surgery | Admitting: Surgery

## 2022-12-02 DIAGNOSIS — I7143 Infrarenal abdominal aortic aneurysm, without rupture: Secondary | ICD-10-CM | POA: Diagnosis not present

## 2022-12-02 DIAGNOSIS — I7121 Aneurysm of the ascending aorta, without rupture: Secondary | ICD-10-CM | POA: Diagnosis not present

## 2022-12-02 DIAGNOSIS — I714 Abdominal aortic aneurysm, without rupture, unspecified: Secondary | ICD-10-CM | POA: Diagnosis not present

## 2022-12-02 MED ORDER — IOHEXOL 350 MG/ML SOLN
100.0000 mL | Freq: Once | INTRAVENOUS | Status: AC | PRN
  Administered 2022-12-02: 100 mL via INTRAVENOUS

## 2022-12-12 ENCOUNTER — Encounter: Payer: Self-pay | Admitting: Surgery

## 2022-12-12 ENCOUNTER — Ambulatory Visit (HOSPITAL_COMMUNITY)
Admission: RE | Admit: 2022-12-12 | Discharge: 2022-12-12 | Disposition: A | Discharge status: Home / Self Care | Payer: Medicare Other | Source: Ambulatory Visit | Attending: Surgery | Admitting: Surgery

## 2022-12-12 ENCOUNTER — Ambulatory Visit (INDEPENDENT_AMBULATORY_CARE_PROVIDER_SITE_OTHER): Payer: Medicare Other | Admitting: Surgery

## 2022-12-12 VITALS — BP 112/78 | HR 88 | Temp 98.2°F | Resp 20 | Ht 72.0 in | Wt 173.0 lb

## 2022-12-12 DIAGNOSIS — M353 Polymyalgia rheumatica: Secondary | ICD-10-CM | POA: Diagnosis not present

## 2022-12-12 DIAGNOSIS — M79604 Pain in right leg: Secondary | ICD-10-CM

## 2022-12-12 DIAGNOSIS — M79605 Pain in left leg: Secondary | ICD-10-CM | POA: Diagnosis not present

## 2022-12-12 DIAGNOSIS — I7123 Aneurysm of the descending thoracic aorta, without rupture: Secondary | ICD-10-CM | POA: Diagnosis not present

## 2022-12-12 DIAGNOSIS — I7143 Infrarenal abdominal aortic aneurysm, without rupture: Secondary | ICD-10-CM | POA: Diagnosis not present

## 2022-12-12 DIAGNOSIS — I700 Atherosclerosis of aorta: Secondary | ICD-10-CM | POA: Diagnosis not present

## 2022-12-12 DIAGNOSIS — E2740 Unspecified adrenocortical insufficiency: Secondary | ICD-10-CM | POA: Diagnosis not present

## 2022-12-12 DIAGNOSIS — I77810 Thoracic aortic ectasia: Secondary | ICD-10-CM | POA: Diagnosis not present

## 2022-12-12 LAB — VAS US ABI WITH/WO TBI
Left ABI: 1.24
Right ABI: 1.29

## 2022-12-12 NOTE — Progress Notes (Signed)
[facility] [place]  Patient name: [name] MRN: [id number] DOB: [birth year] Sex: male   REASON FOR VISIT:    Follow up  HISOTRY OF PRESENT ILLNESS:    [name] is a 89 y.o. male who is status post endovascular aneurysm repair on 08/16/2017 for a 5.2 cm aneurysm.  In January 2021 the aneurysm had grown to 5.6 with a type II endoleak.  In August 2021 it was 5.8 cm.  Dr. Laurence Ferrari performed liquid embolization on 12/11/2020 of the right L5 lumbar and median sacral arteries.  He is getting routine follow-up.  He also has a thoracic aortic aneurysm.  He continues to take a statin for hypercholesterolemia.  He has been having issues with leg pain.  He has been diagnosed with a femoral nerve issue.  He has undergone nerve block in the past 1 of which was successful, the other was not.  He comes back today for arterial evaluation   PAST MEDICAL HISTORY:   Past Medical History:  Diagnosis Date   AAA (abdominal aortic aneurysm) (HCC)    Abnormal EKG    Left atrial abnormality   Allergic rhinitis    Allergy    Benign neoplasm of colon 04/14/10   3 small polyps on colonoscopy by Dr. Henrene Pastor   Carpal tunnel syndrome    Cataract    Degenerative disc disease    Disturbances metabolism of methionine, homocystine, and cystathionine (HCC)    Elevated homocysteine   Elevated prostate specific antigen (PSA)    Hearing loss    Hyperlipidemia    Impotence of organic origin    Penile implant   Internal hemorrhoids    Neck pain    Otosclerosis of both ears    Peripheral vascular disease (HCC)    Bilateral femoral bruit   Plantar fasciitis    Polymyalgia rheumatica (HCC)    Rotator cuff syndrome of left shoulder    Scoliosis    Shoulder pain      FAMILY HISTORY:   Family History  Problem Relation Age of Onset   Heart disease Mother    Emphysema Mother    Colon cancer Neg Hx    Esophageal cancer Neg Hx    Rectal  cancer Neg Hx    Stomach cancer Neg Hx     SOCIAL HISTORY:   Social History   Tobacco Use   Smoking status: Never   Smokeless tobacco: Never  Substance Use Topics   Alcohol use: No    Alcohol/week: 0.0 standard drinks of alcohol     ALLERGIES:   Allergies  Allergen Reactions   Amoxicillin Other (See Comments)   Codeine Nausea And Vomiting   Doxycycline Other (See Comments)    Upset stomach, currently taking and tolerating well     Morphine And Related Nausea And Vomiting     CURRENT MEDICATIONS:   Current Outpatient Medications  Medication Sig Dispense Refill   acyclovir (ZOVIRAX) 400 MG tablet ONE TABLET TWICE A DAY DUE TO HERPES INFECTION (Patient taking differently: Take 400 mg by mouth daily.) 60 tablet 5   cholecalciferol (VITAMIN D3) 25 MCG (1000 UNIT) tablet Take 1,000 Units by mouth daily.     fish oil-omega-3 fatty acids 1000 MG capsule Take 1,000 mg by mouth daily.      fluorometholone (FML) 0.1 % ophthalmic suspension Place 1 drop into the right eye at bedtime.      L-Methylfolate-B12-B6-B2 (METAFOLBIC) 04-28-49-5 MG TABS TAKE ONE TABLET TWICE DAILY (Patient  taking differently: Take 1 tablet by mouth in the morning and at bedtime.) 60 tablet 2   Multiple Vitamins-Minerals (MULTIVITAMIN PO) Take 1 tablet by mouth daily.      predniSONE (DELTASONE) 5 MG tablet Take 5 mg by mouth daily. To prevent myalgias     rosuvastatin (CRESTOR) 5 MG tablet TAKE ONE TABLET EACH DAY TO REDUCE CHOLESTEROL (Patient taking differently: Take 5 mg by mouth daily.) 90 tablet 1   tamsulosin (FLOMAX) 0.4 MG CAPS capsule Take 0.8 mg by mouth at bedtime.     Current Facility-Administered Medications  Medication Dose Route Frequency Provider Last Rate Last Admin   0.9 %  sodium chloride infusion  500 mL Intravenous Once Irene Shipper, MD        REVIEW OF SYSTEMS:   '[X]'$  denotes positive finding, '[ ]'$  denotes negative finding Cardiac  Comments:  Chest pain or chest pressure:     Shortness of breath upon exertion:    Short of breath when lying flat:    Irregular heart rhythm:        Vascular    Pain in calf, thigh, or hip brought on by ambulation:    Pain in feet at night that wakes you up from your sleep:     Blood clot in your veins:    Leg swelling:         Pulmonary    Oxygen at home:    Productive cough:     Wheezing:         Neurologic    Sudden weakness in arms or legs:     Sudden numbness in arms or legs:     Sudden onset of difficulty speaking or slurred speech:    Temporary loss of vision in one eye:     Problems with dizziness:         Gastrointestinal    Blood in stool:     Vomited blood:         Genitourinary    Burning when urinating:     Blood in urine:        Psychiatric    Major depression:         Hematologic    Bleeding problems:    Problems with blood clotting too easily:        Skin    Rashes or ulcers:        Constitutional    Fever or chills:      PHYSICAL EXAM:   Vitals:   12/12/22 0858  BP: 112/78  Pulse: 88  Resp: 20  Temp: 98.2 F (36.8 C)  SpO2: 95%  Weight: 173 lb (78.5 kg)  Height: 6' (1.829 m)    GENERAL: The patient is a well-nourished male, in no acute distress. The vital signs are documented above. CARDIAC: There is a regular rate and rhythm.  VASCULAR: Pedal pulses were difficult to palpate PULMONARY: Non-labored respirations  MUSCULOSKELETAL: There are no major deformities or cyanosis. NEUROLOGIC: No focal weakness or paresthesias are detected. SKIN: There are no ulcers or rashes noted. PSYCHIATRIC: The patient has a normal affect.  STUDIES:   I have reviewed the following: 1. Continued slow enlargement of abdominal aortic aneurysm despite prior endovascular aortic repair and type 2 endoleak repair. The aneurysm sac measures 6.6 x 6.0 cm compared to 6.2 x 5.6 cm previously. Persistent delayed type 2 endoleak visible only on the venous phase images. 2. Stable aneurysmal dilation of  the ascending, transverse and descending thoracic aorta with  a maximal transverse diameter of 5.1 cm in the proximal descending thoracic aorta. Ascending thoracic aortic aneurysm. Recommend semi-annual imaging followup by CTA or MRA and referral to cardiothoracic surgery if not already obtained. This recommendation follows 2010 ACCF/AHA/AATS/ACR/ASA/SCA/SCAI/SIR/STS/SVM Guidelines for the Diagnosis and Management of Patients With Thoracic Aortic Disease. Circulation. 2010; 121: H789-B847. Aortic aneurysm NOS (ICD10-I71.9) 3. Additional ancillary findings as above without significant interval change.  MEDICAL ISSUES:   AAA: Maximum aortic diameter is now 6.6 cm with a delayed type II endoleak only visible on venous imaging.  I reviewed the images with Dr. Laurence Ferrari.  We are both in agreement that continued observation is the best course of action, since there is no visualization of endoleak on arterial phase.  He is scheduled for scan again in 6 months  Leg pain: ABIs were performed today which were normal.  I reassured him that he does not have vascular disease in his legs but this is all likely related to nerve issues.  Thoracic aneurysm: Descending and transverse aortic aneurysm with maximum diameter 5.1 cm.  This is stable.  We will continue to follow this.  I would not recommend intervention until it is 5.5 cm.    Leia Alf, MD, FACS [facility] [phone] Pager [phone]

## 2022-12-15 DIAGNOSIS — H34832 Tributary (branch) retinal vein occlusion, left eye, with macular edema: Secondary | ICD-10-CM | POA: Diagnosis not present

## 2022-12-15 DIAGNOSIS — Z961 Presence of intraocular lens: Secondary | ICD-10-CM | POA: Diagnosis not present

## 2022-12-28 ENCOUNTER — Encounter (HOSPITAL_COMMUNITY): Payer: Medicare Other

## 2022-12-30 ENCOUNTER — Other Ambulatory Visit (HOSPITAL_COMMUNITY): Payer: Self-pay | Admitting: *Deleted

## 2023-01-02 ENCOUNTER — Ambulatory Visit (HOSPITAL_COMMUNITY)
Admission: RE | Admit: 2023-01-02 | Discharge: 2023-01-02 | Disposition: A | Discharge status: Home / Self Care | Payer: Medicare Other | Source: Ambulatory Visit | Attending: Internal Medicine | Admitting: Internal Medicine

## 2023-01-02 DIAGNOSIS — M810 Age-related osteoporosis without current pathological fracture: Secondary | ICD-10-CM | POA: Insufficient documentation

## 2023-01-02 MED ORDER — DENOSUMAB 60 MG/ML ~~LOC~~ SOSY
PREFILLED_SYRINGE | SUBCUTANEOUS | Status: AC
  Administered 2023-01-02: 60 mg via SUBCUTANEOUS
  Filled 2023-01-02: qty 1

## 2023-01-02 MED ORDER — DENOSUMAB 60 MG/ML ~~LOC~~ SOSY: 60.0000 mg | PREFILLED_SYRINGE | Freq: Once | SUBCUTANEOUS | Status: AC

## 2023-01-03 DIAGNOSIS — H6123 Impacted cerumen, bilateral: Secondary | ICD-10-CM | POA: Diagnosis not present

## 2023-01-09 DIAGNOSIS — L570 Actinic keratosis: Secondary | ICD-10-CM | POA: Diagnosis not present

## 2023-01-09 DIAGNOSIS — D225 Melanocytic nevi of trunk: Secondary | ICD-10-CM | POA: Diagnosis not present

## 2023-01-09 DIAGNOSIS — Z85828 Personal history of other malignant neoplasm of skin: Secondary | ICD-10-CM | POA: Diagnosis not present

## 2023-01-09 DIAGNOSIS — L821 Other seborrheic keratosis: Secondary | ICD-10-CM | POA: Diagnosis not present

## 2023-01-12 DIAGNOSIS — R5383 Other fatigue: Secondary | ICD-10-CM | POA: Diagnosis not present

## 2023-01-12 DIAGNOSIS — E785 Hyperlipidemia, unspecified: Secondary | ICD-10-CM | POA: Diagnosis not present

## 2023-01-12 DIAGNOSIS — M353 Polymyalgia rheumatica: Secondary | ICD-10-CM | POA: Diagnosis not present

## 2023-01-18 DIAGNOSIS — H34832 Tributary (branch) retinal vein occlusion, left eye, with macular edema: Secondary | ICD-10-CM | POA: Diagnosis not present

## 2023-01-18 DIAGNOSIS — M353 Polymyalgia rheumatica: Secondary | ICD-10-CM | POA: Diagnosis not present

## 2023-01-18 DIAGNOSIS — Z6824 Body mass index (BMI) 24.0-24.9, adult: Secondary | ICD-10-CM | POA: Diagnosis not present

## 2023-01-18 DIAGNOSIS — M2560 Stiffness of unspecified joint, not elsewhere classified: Secondary | ICD-10-CM | POA: Diagnosis not present

## 2023-01-18 DIAGNOSIS — Z961 Presence of intraocular lens: Secondary | ICD-10-CM | POA: Diagnosis not present

## 2023-01-18 DIAGNOSIS — Z9889 Other specified postprocedural states: Secondary | ICD-10-CM | POA: Diagnosis not present

## 2023-01-18 DIAGNOSIS — H179 Unspecified corneal scar and opacity: Secondary | ICD-10-CM | POA: Diagnosis not present

## 2023-01-18 DIAGNOSIS — Z7952 Long term (current) use of systemic steroids: Secondary | ICD-10-CM | POA: Diagnosis not present

## 2023-01-18 DIAGNOSIS — R5383 Other fatigue: Secondary | ICD-10-CM | POA: Diagnosis not present

## 2023-01-18 DIAGNOSIS — Z79899 Other long term (current) drug therapy: Secondary | ICD-10-CM | POA: Diagnosis not present

## 2023-01-24 ENCOUNTER — Other Ambulatory Visit: Payer: Self-pay

## 2023-01-24 ENCOUNTER — Emergency Department (HOSPITAL_COMMUNITY): Payer: Medicare Other

## 2023-01-24 ENCOUNTER — Observation Stay (HOSPITAL_COMMUNITY)
Admission: EM | Admit: 2023-01-24 | Discharge: 2023-01-25 | Disposition: A | Discharge status: Home / Self Care | Payer: Medicare Other | Attending: Internal Medicine | Admitting: Internal Medicine

## 2023-01-24 DIAGNOSIS — Z79899 Other long term (current) drug therapy: Secondary | ICD-10-CM | POA: Diagnosis not present

## 2023-01-24 DIAGNOSIS — R531 Weakness: Secondary | ICD-10-CM | POA: Diagnosis present

## 2023-01-24 DIAGNOSIS — D84821 Immunodeficiency due to drugs: Secondary | ICD-10-CM

## 2023-01-24 DIAGNOSIS — Z1152 Encounter for screening for COVID-19: Secondary | ICD-10-CM | POA: Diagnosis not present

## 2023-01-24 DIAGNOSIS — R9431 Abnormal electrocardiogram [ECG] [EKG]: Secondary | ICD-10-CM | POA: Diagnosis not present

## 2023-01-24 DIAGNOSIS — I679 Cerebrovascular disease, unspecified: Secondary | ICD-10-CM | POA: Diagnosis present

## 2023-01-24 DIAGNOSIS — E785 Hyperlipidemia, unspecified: Secondary | ICD-10-CM | POA: Diagnosis present

## 2023-01-24 DIAGNOSIS — R509 Fever, unspecified: Secondary | ICD-10-CM | POA: Diagnosis not present

## 2023-01-24 DIAGNOSIS — M353 Polymyalgia rheumatica: Secondary | ICD-10-CM | POA: Diagnosis present

## 2023-01-24 DIAGNOSIS — R5383 Other fatigue: Secondary | ICD-10-CM | POA: Diagnosis not present

## 2023-01-24 DIAGNOSIS — E2740 Unspecified adrenocortical insufficiency: Secondary | ICD-10-CM | POA: Diagnosis not present

## 2023-01-24 DIAGNOSIS — R0602 Shortness of breath: Secondary | ICD-10-CM | POA: Insufficient documentation

## 2023-01-24 DIAGNOSIS — A419 Sepsis, unspecified organism: Principal | ICD-10-CM

## 2023-01-24 DIAGNOSIS — I639 Cerebral infarction, unspecified: Secondary | ICD-10-CM | POA: Insufficient documentation

## 2023-01-24 DIAGNOSIS — Z95828 Presence of other vascular implants and grafts: Secondary | ICD-10-CM | POA: Insufficient documentation

## 2023-01-24 DIAGNOSIS — B0239 Other herpes zoster eye disease: Secondary | ICD-10-CM | POA: Insufficient documentation

## 2023-01-24 DIAGNOSIS — Z8679 Personal history of other diseases of the circulatory system: Secondary | ICD-10-CM

## 2023-01-24 DIAGNOSIS — J8410 Pulmonary fibrosis, unspecified: Secondary | ICD-10-CM | POA: Diagnosis not present

## 2023-01-24 LAB — URINALYSIS, ROUTINE W REFLEX MICROSCOPIC
Bilirubin Urine: NEGATIVE
Glucose, UA: NEGATIVE mg/dL
Ketones, ur: NEGATIVE mg/dL
Leukocytes,Ua: NEGATIVE
Nitrite: NEGATIVE
Protein, ur: NEGATIVE mg/dL
Specific Gravity, Urine: 1.01 (ref 1.005–1.030)
pH: 6 (ref 5.0–8.0)

## 2023-01-24 LAB — CBC WITH DIFFERENTIAL/PLATELET
Abs Immature Granulocytes: 0.12 10*3/uL — ABNORMAL HIGH (ref 0.00–0.07)
Basophils Absolute: 0 10*3/uL (ref 0.0–0.1)
Basophils Relative: 0 %
Eosinophils Absolute: 0 10*3/uL (ref 0.0–0.5)
Eosinophils Relative: 0 %
HCT: 39.5 % (ref 39.0–52.0)
Hemoglobin: 12.8 g/dL — ABNORMAL LOW (ref 13.0–17.0)
Immature Granulocytes: 1 %
Lymphocytes Relative: 5 %
Lymphs Abs: 0.7 10*3/uL (ref 0.7–4.0)
MCH: 31.2 pg (ref 26.0–34.0)
MCHC: 32.4 g/dL (ref 30.0–36.0)
MCV: 96.3 fL (ref 80.0–100.0)
Monocytes Absolute: 0.6 10*3/uL (ref 0.1–1.0)
Monocytes Relative: 4 %
Neutro Abs: 13.1 10*3/uL — ABNORMAL HIGH (ref 1.7–7.7)
Neutrophils Relative %: 90 %
Platelets: 321 10*3/uL (ref 150–400)
RBC: 4.1 MIL/uL — ABNORMAL LOW (ref 4.22–5.81)
RDW: 15.6 % — ABNORMAL HIGH (ref 11.5–15.5)
WBC: 14.5 10*3/uL — ABNORMAL HIGH (ref 4.0–10.5)
nRBC: 0 % (ref 0.0–0.2)

## 2023-01-24 LAB — RESP PANEL BY RT-PCR (RSV, FLU A&B, COVID)  RVPGX2
Influenza A by PCR: NEGATIVE
Influenza B by PCR: NEGATIVE
Resp Syncytial Virus by PCR: NEGATIVE
SARS Coronavirus 2 by RT PCR: NEGATIVE

## 2023-01-24 LAB — URINALYSIS, MICROSCOPIC (REFLEX)

## 2023-01-24 LAB — COMPREHENSIVE METABOLIC PANEL
ALT: 71 U/L — ABNORMAL HIGH (ref 0–44)
AST: 45 U/L — ABNORMAL HIGH (ref 15–41)
Albumin: 2.6 g/dL — ABNORMAL LOW (ref 3.5–5.0)
Alkaline Phosphatase: 87 U/L (ref 38–126)
Anion gap: 10 (ref 5–15)
BUN: 22 mg/dL (ref 8–23)
CO2: 27 mmol/L (ref 22–32)
Calcium: 8.8 mg/dL — ABNORMAL LOW (ref 8.9–10.3)
Chloride: 98 mmol/L (ref 98–111)
Creatinine, Ser: 1.24 mg/dL (ref 0.61–1.24)
GFR, Estimated: 55 mL/min — ABNORMAL LOW (ref >60)
Glucose, Bld: 148 mg/dL — ABNORMAL HIGH (ref 70–99)
Potassium: 4.4 mmol/L (ref 3.5–5.1)
Sodium: 135 mmol/L (ref 135–145)
Total Bilirubin: 0.6 mg/dL (ref 0.3–1.2)
Total Protein: 6.7 g/dL (ref 6.5–8.1)

## 2023-01-24 LAB — LACTIC ACID, PLASMA
Lactic Acid, Venous: 1.3 mmol/L (ref 0.5–1.9)
Lactic Acid, Venous: 1.1 mmol/L (ref 0.5–1.9)

## 2023-01-24 LAB — TSH: TSH: 1.281 u[IU]/mL (ref 0.350–4.500)

## 2023-01-24 LAB — SEDIMENTATION RATE: Sed Rate: 78 mm/hr — ABNORMAL HIGH (ref 0–16)

## 2023-01-24 MED ORDER — TAMSULOSIN HCL 0.4 MG PO CAPS: 0.8000 mg | ORAL_CAPSULE | Freq: Every day | ORAL | Status: DC

## 2023-01-24 MED ORDER — VANCOMYCIN HCL IN DEXTROSE 1-5 GM/200ML-% IV SOLN
1000.0000 mg | INTRAVENOUS | Status: DC
  Filled 2023-01-24: qty 200

## 2023-01-24 MED ORDER — PREDNISONE 5 MG PO TABS: 5.0000 mg | ORAL_TABLET | Freq: Every evening | ORAL | Status: DC

## 2023-01-24 MED ORDER — SODIUM CHLORIDE 0.9 % IV BOLUS
1000.0000 mL | Freq: Once | INTRAVENOUS | Status: AC
  Administered 2023-01-24: 1000 mL via INTRAVENOUS

## 2023-01-24 MED ORDER — VANCOMYCIN HCL 500 MG/100ML IV SOLN
500.0000 mg | Freq: Once | INTRAVENOUS | Status: AC
  Administered 2023-01-24: 500 mg via INTRAVENOUS
  Filled 2023-01-24: qty 100

## 2023-01-24 MED ORDER — PREDNISONE 20 MG PO TABS
20.0000 mg | ORAL_TABLET | Freq: Every day | ORAL | Status: DC
  Administered 2023-01-25: 20 mg via ORAL
  Filled 2023-01-24: qty 1

## 2023-01-24 MED ORDER — VANCOMYCIN HCL IN DEXTROSE 1-5 GM/200ML-% IV SOLN
1000.0000 mg | Freq: Once | INTRAVENOUS | Status: AC
  Administered 2023-01-24: 1000 mg via INTRAVENOUS
  Filled 2023-01-24: qty 200

## 2023-01-24 MED ORDER — FOLIC ACID 1 MG PO TABS
1.0000 mg | ORAL_TABLET | Freq: Every day | ORAL | Status: DC
  Administered 2023-01-25: 1 mg via ORAL
  Filled 2023-01-24: qty 1

## 2023-01-24 MED ORDER — ROSUVASTATIN CALCIUM 5 MG PO TABS: 5.0000 mg | ORAL_TABLET | Freq: Every day | ORAL | Status: DC

## 2023-01-24 MED ORDER — SODIUM CHLORIDE 0.9 % IV SOLN
2.0000 g | Freq: Two times a day (BID) | INTRAVENOUS | Status: DC
  Administered 2023-01-25: 2 g via INTRAVENOUS
  Filled 2023-01-24: qty 12.5

## 2023-01-24 MED ORDER — ACYCLOVIR 400 MG PO TABS
400.0000 mg | ORAL_TABLET | Freq: Every day | ORAL | Status: DC
  Administered 2023-01-25: 400 mg via ORAL
  Filled 2023-01-24 (×2): qty 1

## 2023-01-24 MED ORDER — SODIUM CHLORIDE 0.9 % IV SOLN
2.0000 g | Freq: Once | INTRAVENOUS | Status: AC
  Administered 2023-01-24: 2 g via INTRAVENOUS
  Filled 2023-01-24: qty 12.5

## 2023-01-24 MED ORDER — FOLIC ACID 1 MG PO TABS: 1.0000 mg | ORAL_TABLET | Freq: Every day | ORAL | Status: DC

## 2023-01-24 NOTE — ED Notes (Signed)
RN requested Network engineer page MD.

## 2023-01-24 NOTE — Progress Notes (Addendum)
Pharmacy Antibiotic Note  [name] is a 89 y.o. male admitted on 01/24/2023 presenting with weakness, concern for sepsis.  Pharmacy has been consulted for cefepime dosing.  Plan: Cefepime 2g IV every 12 hours Monitor renal function, Cx and clinical progression to narrow  Addendum: Now consulted for vanc, 1g IV already given, add 500 mg IV x 1 for '1500mg'$  load Then vancomycin 1g IV q 24h (eAUC 437) Monitor renal function, Cx and clinical progression to narrow Vancomycin levels as indicated     Temp (24hrs), Avg:97.7 F (36.5 C), Min:97.7 F (36.5 C), Max:97.7 F (36.5 C)  Recent Labs  Lab 01/24/23 1748  WBC 14.5*  CREATININE 1.24  LATICACIDVEN 1.3    CrCl cannot be calculated (Unknown ideal weight.).    Allergies  Allergen Reactions   Amoxicillin Other (See Comments)   Codeine Nausea And Vomiting   Doxycycline Other (See Comments)    Upset stomach, currently taking and tolerating well     Morphine And Related Nausea And Vomiting    Bertis Ruddy, PharmD, [place] Pharmacist ED Pharmacist Phone # [phone] 01/24/2023 6:45 PM

## 2023-01-24 NOTE — ED Triage Notes (Signed)
Pt and family report long term steroid use d/t polymyalgia rheumatica. Currently taking '25mg'$  QD. Since June having decreased energy and was started on Methotrexate by rheumatologist this week. Fever Saturday and Sunday and having continued weakness. 88/30 BP at PCP office just PTA. Pt denies complaints.

## 2023-01-24 NOTE — ED Provider Triage Note (Incomplete)
Emergency Medicine Provider Triage Evaluation Note  [name] , a 89 y.o. male  was evaluated in triage.  Pt complains of ***.  Review of Systems  Asx  Physical Exam  BP 130/84   Pulse 95   Temp 97.7 F (36.5 C) (Oral)   Resp 16   SpO2 98%  Gen:   Awake, no distress   Resp:  Normal effort  MSK:   Moves extremities without difficulty  Other:    Medical Decision Making  Medically screening exam initiated at 5:33 PM.  Appropriate orders placed.  OTHAR NEVILLS was informed that the remainder of the evaluation will be completed by another provider, this initial triage assessment does not replace that evaluation, and the importance of remaining in the ED until their evaluation is complete.

## 2023-01-24 NOTE — ED Notes (Signed)
Pt ambulatory to restroom with standby assist to provide urine sample

## 2023-01-24 NOTE — H&P (Incomplete)
PCP:   Crist Infante, MD   Chief Complaint:  ***  HPI:  Qsun mtx, hed  Review of Systems:  The patient denies anorexia, fever, weight loss,, vision loss, decreased hearing, hoarseness, chest pain, syncope, dyspnea on exertion, peripheral edema, balance deficits, hemoptysis, abdominal pain, melena, hematochezia, severe indigestion/heartburn, hematuria, incontinence, genital sores, muscle weakness, suspicious skin lesions, transient blindness, difficulty walking, depression, unusual weight change, abnormal bleeding, enlarged lymph nodes, angioedema, and breast masses.  Past Medical History: Past Medical History:  Diagnosis Date   AAA (abdominal aortic aneurysm) (HCC)    Abnormal EKG    Left atrial abnormality   Allergic rhinitis    Allergy    Benign neoplasm of colon 04/14/10   3 small polyps on colonoscopy by Dr. Henrene Pastor   Carpal tunnel syndrome    Cataract    Degenerative disc disease    Disturbances metabolism of methionine, homocystine, and cystathionine (HCC)    Elevated homocysteine   Elevated prostate specific antigen (PSA)    Hearing loss    Hyperlipidemia    Impotence of organic origin    Penile implant   Internal hemorrhoids    Neck pain    Otosclerosis of both ears    Peripheral vascular disease (HCC)    Bilateral femoral bruit   Plantar fasciitis    Polymyalgia rheumatica (HCC)    Rotator cuff syndrome of left shoulder    Scoliosis    Shoulder pain    Past Surgical History:  Procedure Laterality Date   ABDOMINAL AORTIC ENDOVASCULAR STENT GRAFT N/A 08/16/2017   Procedure: ABDOMINAL AORTIC ENDOVASCULAR STENT GRAFT insertion;  Surgeon: Serafina Mitchell, MD;  Location: [facility] OR;  Service: Vascular;  Laterality: N/A;   Actinic keratosis removal  06/21/2010   Left shoulder; Dr. Sarajane Jews   COLONOSCOPY     COLONOSCOPY W/ POLYPECTOMY     DUPUYTREN CONTRACTURE RELEASE Right 12/05/2013   Procedure: DUPUYTREN CONTRACTURE RELEASE RIGHT LONG, [place] AND SMALL  FINGERS;  Surgeon: Cammie Sickle., MD;  Location: [facility];  Service: Orthopedics;  Laterality: Right;   Implant penile pump  2001   IR ANGIOGRAM PELVIS SELECTIVE OR SUPRASELECTIVE  12/11/2020   IR ANGIOGRAM SELECTIVE EACH ADDITIONAL VESSEL  12/11/2020   IR EMBO ARTERIAL NOT HEMORR HEMANG INC GUIDE ROADMAPPING  12/11/2020   IR RADIOLOGIST EVAL & MGMT  07/30/2020   IR US GUIDE VASC ACCESS RIGHT  12/11/2020   Resection of appendix and tip of rectum  February 2005   STAPEDECTOMY Bilateral 1985, 1988   [employer]    Medications: Prior to Admission medications   Medication Sig Start Date End Date Taking? Authorizing Provider  acyclovir (ZOVIRAX) 400 MG tablet ONE TABLET TWICE A DAY DUE TO HERPES INFECTION Patient taking differently: Take 400 mg by mouth daily. 01/22/18   [name], NP  cholecalciferol (VITAMIN D3) 25 MCG (1000 UNIT) tablet Take 1,000 Units by mouth daily.    [provider]  fish oil-omega-3 fatty acids 1000 MG capsule Take 1,000 mg by mouth daily.     [provider]  fluorometholone (FML) 0.1 % ophthalmic suspension Place 1 drop into the right eye at bedtime.     [provider]  L-Methylfolate-B12-B6-B2 (METAFOLBIC) 04-28-49-5 MG TABS TAKE ONE TABLET TWICE DAILY Patient taking differently: Take 1 tablet by mouth in the morning and at bedtime. 08/18/17   [name], NP  Multiple Vitamins-Minerals (MULTIVITAMIN PO) Take 1 tablet by  mouth daily.     [provider]  predniSONE (DELTASONE) 5 MG tablet Take 5 mg by mouth daily. To prevent myalgias    [provider]  rosuvastatin (CRESTOR) 5 MG tablet TAKE ONE TABLET EACH DAY TO REDUCE CHOLESTEROL Patient taking differently: Take 5 mg by mouth daily. 09/01/17   [name], NP  tamsulosin (FLOMAX) 0.4 MG CAPS capsule Take 0.8 mg by mouth at bedtime. 06/10/20   [provider]    Allergies:   Allergies  Allergen Reactions    Amoxicillin Other (See Comments)   Codeine Nausea And Vomiting   Doxycycline Other (See Comments)    Upset stomach, currently taking and tolerating well     Morphine And Related Nausea And Vomiting    Social History:  reports that he has never smoked. He has never used smokeless tobacco. He reports that he does not drink alcohol and does not use drugs.  Family History: Family History  Problem Relation Age of Onset   Heart disease Mother    Emphysema Mother    Colon cancer Neg Hx    Esophageal cancer Neg Hx    Rectal cancer Neg Hx    Stomach cancer Neg Hx     Physical Exam: Vitals:   01/24/23 1720 01/24/23 1800 01/24/23 1835 01/24/23 1836  BP: 130/84 117/85 107/76   Pulse: 95 82 (!) 54 76  Resp: '16 19 20 17  '$ Temp: 97.7 F (36.5 C)     TempSrc: Oral     SpO2: 98% 100% 96% 96%    General:  Alert and oriented times three, well developed and nourished, no acute distress Eyes: PERRLA, pink conjunctiva, no scleral icterus ENT: Moist oral mucosa, neck supple, no thyromegaly Lungs: clear to ascultation, no wheeze, no crackles, no use of accessory muscles Cardiovascular: regular rate and rhythm, no regurgitation, no gallops, no murmurs. No carotid bruits, no JVD Abdomen: soft, positive BS, non-tender, non-distended, no organomegaly, not an acute abdomen GU: not examined Neuro: CN II - XII grossly intact, sensation intact Musculoskeletal: strength 5/5 all extremities, no clubbing, cyanosis or edema Skin: no rash, no subcutaneous crepitation, no decubitus Psych: appropriate patient   Labs on Admission:  Recent Labs    01/24/23 1748  NA 135  K 4.4  CL 98  CO2 27  GLUCOSE 148*  BUN 22  CREATININE 1.24  CALCIUM 8.8*   Recent Labs    01/24/23 1748  AST 45*  ALT 71*  ALKPHOS 87  BILITOT 0.6  PROT 6.7  ALBUMIN 2.6*   No results for input(s): "LIPASE", "AMYLASE" in the last 72 hours. Recent Labs    01/24/23 1748  WBC 14.5*  NEUTROABS 13.1*  HGB 12.8*  HCT  39.5  MCV 96.3  PLT 321   No results for input(s): "CKTOTAL", "CKMB", "CKMBINDEX", "TROPONINI" in the last 72 hours. Invalid input(s): "POCBNP" No results for input(s): "DDIMER" in the last 72 hours. No results for input(s): "HGBA1C" in the last 72 hours. No results for input(s): "CHOL", "HDL", "LDLCALC", "TRIG", "CHOLHDL", "LDLDIRECT" in the last 72 hours. Recent Labs    01/24/23 1706  TSH 1.281   No results for input(s): "VITAMINB12", "FOLATE", "FERRITIN", "TIBC", "IRON", "RETICCTPCT" in the last 72 hours.  Micro Results: Recent Results (from the past 240 hour(s))  Resp panel by RT-PCR (RSV, Flu A&B, Covid) Anterior Nasal Swab     Status: None   Collection Time: 01/24/23  5:31 PM   Specimen: Anterior Nasal Swab  Result Value  Ref Range Status   SARS Coronavirus 2 by RT PCR NEGATIVE NEGATIVE Final   Influenza A by PCR NEGATIVE NEGATIVE Final   Influenza B by PCR NEGATIVE NEGATIVE Final    Comment: (NOTE) The Xpert Xpress SARS-CoV-2/FLU/RSV plus assay is intended as an aid in the diagnosis of influenza from Nasopharyngeal swab specimens and should not be used as a sole basis for treatment. Nasal washings and aspirates are unacceptable for Xpert Xpress SARS-CoV-2/FLU/RSV testing.  Fact Sheet for Patients: EntrepreneurPulse.com.au  Fact Sheet for Healthcare Providers: IncredibleEmployment.be  This test is not yet approved or cleared by the [place] FDA and has been authorized for detection and/or diagnosis of SARS-CoV-2 by FDA under an Emergency Use Authorization (EUA). This EUA will remain in effect (meaning this test can be used) for the duration of the COVID-19 declaration under Section 564(b)(1) of the Act, 21 U.S.C. section 360bbb-3(b)(1), unless the authorization is terminated or revoked.     Resp Syncytial Virus by PCR NEGATIVE NEGATIVE Final    Comment: (NOTE) Fact Sheet for  Patients: EntrepreneurPulse.com.au  Fact Sheet for Healthcare Providers: IncredibleEmployment.be  This test is not yet approved or cleared by the [place] FDA and has been authorized for detection and/or diagnosis of SARS-CoV-2 by FDA under an Emergency Use Authorization (EUA). This EUA will remain in effect (meaning this test can be used) for the duration of the COVID-19 declaration under Section 564(b)(1) of the Act, 21 U.S.C. section 360bbb-3(b)(1), unless the authorization is terminated or revoked.  Performed at [facility], [place] [address], [place], [place] [address]      Radiological Exams on Admission: DG Chest 2 View  Result Date: 01/24/2023 CLINICAL DATA:  Shortness of breath. EXAM: CHEST - 2 VIEW COMPARISON:  June 12, 2018. FINDINGS: The heart size and mediastinal contours are within normal limits. Stable left basilar scarring is noted. No acute pulmonary disease is noted. The visualized skeletal structures are unremarkable. IMPRESSION: No active cardiopulmonary disease. Electronically Signed   By: Marijo Conception M.D.   On: 01/24/2023 18:02    Assessment/Plan Present on Admission:  Fever  Cerebrovascular disease  Hyperlipidemia  Polymyalgia rheumatica ([place])   Amariyana Heacox 01/24/2023, 7:52 PM

## 2023-01-24 NOTE — ED Provider Notes (Signed)
[name] Provider Note   CSN: [id number] Arrival date & time: 01/24/23  1706     History  Chief Complaint  Patient presents with   Weakness    [name] is a 89 y.o. male history of polymyalgia rheumatica on steroids, here presenting with fever.  Patient has been having intermittent low-grade temperature for the last several weeks.  Patient had a temperature of 101 4 days ago.  Patient then had persistent low-grade temperature since then and ran another fever at home.  He went to the office and was noted to be hypotensive with blood pressure in the 80s.  Patient states that he feels fine now.  Most of the history is from his wife who is at bedside.  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  Now he just sleeps a lot and this is not usual for him.  Patient does have a history of abdominal aneurysm and had aortic graft with stable endoleak. Patient follows up with Dr. [name] from vascular surgery.    The history is provided by the patient.       Home Medications Prior to Admission medications   Medication Sig Start Date End Date Taking? Authorizing Provider  acyclovir (ZOVIRAX) 400 MG tablet ONE TABLET TWICE A DAY DUE TO HERPES INFECTION Patient taking differently: Take 400 mg by mouth daily. 01/22/18   [name], NP  cholecalciferol (VITAMIN D3) 25 MCG (1000 UNIT) tablet Take 1,000 Units by mouth daily.    [provider]  fish oil-omega-3 fatty acids 1000 MG capsule Take 1,000 mg by mouth daily.     [provider]  fluorometholone (FML) 0.1 % ophthalmic suspension Place 1 drop into the right eye at bedtime.     [provider]  L-Methylfolate-B12-B6-B2 (METAFOLBIC) 04-28-49-5 MG TABS TAKE ONE TABLET TWICE DAILY Patient taking differently: Take 1 tablet by mouth in the morning and at bedtime. 08/18/17   [name], NP  Multiple Vitamins-Minerals  (MULTIVITAMIN PO) Take 1 tablet by mouth daily.     [provider]  predniSONE (DELTASONE) 5 MG tablet Take 5 mg by mouth daily. To prevent myalgias    [provider]  rosuvastatin (CRESTOR) 5 MG tablet TAKE ONE TABLET EACH DAY TO REDUCE CHOLESTEROL Patient taking differently: Take 5 mg by mouth daily. 09/01/17   [name], NP  tamsulosin (FLOMAX) 0.4 MG CAPS capsule Take 0.8 mg by mouth at bedtime. 06/10/20   [provider]      Allergies    Amoxicillin, Codeine, Doxycycline, and Morphine and related    Review of Systems   Review of Systems  Neurological:  Positive for weakness.  All other systems reviewed and are negative.   Physical Exam Updated Vital Signs BP 117/85   Pulse 82   Temp 97.7 F (36.5 C) (Oral)   Resp 19   SpO2 100%  Physical Exam Vitals and nursing note reviewed.  Constitutional:      Appearance: Normal appearance.  HENT:     Head: Normocephalic.     Nose: Nose normal.  Eyes:     Extraocular Movements: Extraocular movements intact.     Pupils: Pupils are equal, round, and reactive to light.  Cardiovascular:     Rate and Rhythm: Normal rate and regular rhythm.     Pulses: Normal pulses.     Heart sounds: Normal heart sounds.  Pulmonary:     Effort: Pulmonary effort is normal.     Breath sounds: Normal breath sounds.  Abdominal:     General: Abdomen is flat.     Palpations: Abdomen is soft.  Musculoskeletal:        General: Normal range of motion.     Cervical back: Normal range of motion and neck supple.  Skin:    General: Skin is warm.     Capillary Refill: Capillary refill takes less than 2 seconds.  Neurological:     General: No focal deficit present.     Mental Status: He is alert and oriented to person, place, and time.  Psychiatric:        Mood and Affect: Mood normal.        Behavior: Behavior normal.     ED Results / Procedures / Treatments   Labs (all labs ordered are listed, but only abnormal  results are displayed) Labs Reviewed  CBC WITH DIFFERENTIAL/PLATELET - Abnormal; Notable for the following components:      Result Value   WBC 14.5 (*)    RBC 4.10 (*)    Hemoglobin 12.8 (*)    RDW 15.6 (*)    Neutro Abs 13.1 (*)    Abs Immature Granulocytes 0.12 (*)    All other components within normal limits  RESP PANEL BY RT-PCR (RSV, FLU A&B, COVID)  [id number]  CULTURE, BLOOD (ROUTINE X 2)  CULTURE, BLOOD (ROUTINE X 2)  LACTIC ACID, PLASMA  LACTIC ACID, PLASMA  COMPREHENSIVE METABOLIC PANEL  URINALYSIS, ROUTINE W REFLEX MICROSCOPIC  SEDIMENTATION RATE  TSH  I-STAT CHEM 8, ED    EKG None  Radiology DG Chest 2 View  Result Date: 01/24/2023 CLINICAL DATA:  Shortness of breath. EXAM: CHEST - 2 VIEW COMPARISON:  June 12, 2018. FINDINGS: The heart size and mediastinal contours are within normal limits. Stable left basilar scarring is noted. No acute pulmonary disease is noted. The visualized skeletal structures are unremarkable. IMPRESSION: No active cardiopulmonary disease. Electronically Signed   By: Marijo Conception M.D.   On: 01/24/2023 18:02    Procedures Procedures    Medications Ordered in ED Medications  sodium chloride 0.9 % bolus 1,000 mL (has no administration in time range)  vancomycin (VANCOCIN) IVPB 1000 mg/200 mL premix (has no administration in time range)  ceFEPIme (MAXIPIME) 2 g in sodium chloride 0.9 % 100 mL IVPB (has no administration in time range)    ED Course/ Medical Decision Making/ A&P                             Medical Decision Making [name] is a 89 y.o. male here presenting with fever and chills.  It is subacute in process.  PCP was concerned for possible endocarditis and patient is immunosuppressed and on chronic prednisone for his polymyalgia rheumatica.  Patient was hypotensive in the office.  Will do sepsis workup with CBC and CMP and lactate and cultures.  Will also get ESR to assess his polymyalgia and also to rule out  endocarditis.  Will give broad-spectrum antibiotics and patient likely need admission  6:38 PM Patient's white blood cell count is 14.  Lactate is normal.  Chest x-ray did not show any obvious pneumonia.  Patient was given broad-spectrum antibiotics for possible bacteremia.  May consider get an echo as well given subacute chills and subjective fevers.   Problems Addressed: Sepsis, due to unspecified organism,  unspecified whether acute organ dysfunction present [facility]): acute illness or injury  Amount and/or Complexity of Data Reviewed Labs: ordered. Decision-making details documented in ED Course. Radiology: ordered and independent interpretation performed. Decision-making details documented in ED Course. ECG/medicine tests: ordered and independent interpretation performed. Decision-making details documented in ED Course.  Risk Prescription drug management. Decision regarding hospitalization.   Final Clinical Impression(s) / ED Diagnoses Final diagnoses:  None    Rx / DC Orders ED Discharge Orders     None         Drenda Freeze, MD 01/24/23 [id number]

## 2023-01-25 ENCOUNTER — Encounter (HOSPITAL_COMMUNITY): Payer: Self-pay | Admitting: Family Medicine

## 2023-01-25 DIAGNOSIS — R5383 Other fatigue: Secondary | ICD-10-CM

## 2023-01-25 DIAGNOSIS — M353 Polymyalgia rheumatica: Secondary | ICD-10-CM | POA: Diagnosis not present

## 2023-01-25 DIAGNOSIS — E785 Hyperlipidemia, unspecified: Secondary | ICD-10-CM | POA: Diagnosis not present

## 2023-01-25 DIAGNOSIS — R509 Fever, unspecified: Secondary | ICD-10-CM | POA: Diagnosis not present

## 2023-01-25 LAB — CBC WITH DIFFERENTIAL/PLATELET
Abs Immature Granulocytes: 0.14 10*3/uL — ABNORMAL HIGH (ref 0.00–0.07)
Basophils Absolute: 0 10*3/uL (ref 0.0–0.1)
Basophils Relative: 0 %
Eosinophils Absolute: 0.1 10*3/uL (ref 0.0–0.5)
Eosinophils Relative: 0 %
HCT: 32.6 % — ABNORMAL LOW (ref 39.0–52.0)
Hemoglobin: 11.1 g/dL — ABNORMAL LOW (ref 13.0–17.0)
Immature Granulocytes: 1 %
Lymphocytes Relative: 7 %
Lymphs Abs: 1 10*3/uL (ref 0.7–4.0)
MCH: 31.7 pg (ref 26.0–34.0)
MCHC: 34 g/dL (ref 30.0–36.0)
MCV: 93.1 fL (ref 80.0–100.0)
Monocytes Absolute: 0.8 10*3/uL (ref 0.1–1.0)
Monocytes Relative: 6 %
Neutro Abs: 11.7 10*3/uL — ABNORMAL HIGH (ref 1.7–7.7)
Neutrophils Relative %: 86 %
Platelets: 272 10*3/uL (ref 150–400)
RBC: 3.5 MIL/uL — ABNORMAL LOW (ref 4.22–5.81)
RDW: 15.7 % — ABNORMAL HIGH (ref 11.5–15.5)
WBC: 13.7 10*3/uL — ABNORMAL HIGH (ref 4.0–10.5)
nRBC: 0 % (ref 0.0–0.2)

## 2023-01-25 LAB — BASIC METABOLIC PANEL
Anion gap: 11 (ref 5–15)
BUN: 21 mg/dL (ref 8–23)
CO2: 22 mmol/L (ref 22–32)
Calcium: 8.1 mg/dL — ABNORMAL LOW (ref 8.9–10.3)
Chloride: 103 mmol/L (ref 98–111)
Creatinine, Ser: 0.97 mg/dL (ref 0.61–1.24)
GFR, Estimated: >60 mL/min (ref >60)
Glucose, Bld: 106 mg/dL — ABNORMAL HIGH (ref 70–99)
Potassium: 4 mmol/L (ref 3.5–5.1)
Sodium: 136 mmol/L (ref 135–145)

## 2023-01-25 LAB — PROCALCITONIN: Procalcitonin: <0.1 ng/mL

## 2023-01-25 LAB — BRAIN NATRIURETIC PEPTIDE: B Natriuretic Peptide: 91.7 pg/mL (ref 0.0–100.0)

## 2023-01-25 LAB — SEDIMENTATION RATE: Sed Rate: 69 mm/hr — ABNORMAL HIGH (ref 0–16)

## 2023-01-25 MED ORDER — BUTALBITAL-APAP-CAFFEINE 50-325-40 MG PO TABS
1.0000 | ORAL_TABLET | Freq: Four times a day (QID) | ORAL | Status: DC | PRN
  Administered 2023-01-25: 1 via ORAL
  Filled 2023-01-25: qty 1

## 2023-01-25 MED ORDER — ONDANSETRON HCL 4 MG PO TABS: 4.0000 mg | ORAL_TABLET | Freq: Four times a day (QID) | ORAL | Status: DC | PRN

## 2023-01-25 MED ORDER — SENNOSIDES-DOCUSATE SODIUM 8.6-50 MG PO TABS: 1.0000 | ORAL_TABLET | Freq: Every evening | ORAL | Status: DC | PRN

## 2023-01-25 MED ORDER — ACETAMINOPHEN 325 MG PO TABS
650.0000 mg | ORAL_TABLET | Freq: Four times a day (QID) | ORAL | Status: DC | PRN
  Administered 2023-01-25 (×2): 650 mg via ORAL
  Filled 2023-01-25 (×2): qty 2

## 2023-01-25 MED ORDER — ONDANSETRON HCL 4 MG/2ML IJ SOLN: 4.0000 mg | Freq: Four times a day (QID) | INTRAMUSCULAR | Status: DC | PRN

## 2023-01-25 MED ORDER — ENOXAPARIN SODIUM 40 MG/0.4ML IJ SOSY
40.0000 mg | PREFILLED_SYRINGE | INTRAMUSCULAR | Status: DC
  Administered 2023-01-25: 40 mg via SUBCUTANEOUS
  Filled 2023-01-25: qty 0.4

## 2023-01-25 MED ORDER — PREDNISONE 5 MG PO TABS: ORAL_TABLET | ORAL | 0 refills | Status: DC

## 2023-01-25 MED ORDER — ACETAMINOPHEN 650 MG RE SUPP: 650.0000 mg | Freq: Four times a day (QID) | RECTAL | Status: DC | PRN

## 2023-01-25 NOTE — TOC Transition Note (Signed)
Transition of Care [facility]) - CM/SW Discharge Note   Patient Details  Name: [name] MRN: [id number] Date of Birth: 1930-03-10  Transition of Care [facility]) CM/SW Contact:  Pollie Friar, RN Phone Number: 01/25/2023, 5:27 PM   Clinical Narrative:    Pt is discharging home with self care. No needs per TOC.   Final next level of care: Home/Self Care Barriers to Discharge: No Barriers Identified   Patient Goals and CMS Choice      Discharge Placement                         Discharge Plan and Services Additional resources added to the After Visit Summary for                                       Social Determinants of Health (SDOH) Interventions SDOH Screenings   Food Insecurity: No Food Insecurity (01/25/2023)  Housing: Low Risk  (01/25/2023)  Transportation Needs: No Transportation Needs (01/25/2023)  Utilities: Not At Risk (01/25/2023)  Tobacco Use: Low Risk  (01/25/2023)     Readmission Risk Interventions     No data to display

## 2023-01-25 NOTE — ED Notes (Signed)
ED TO INPATIENT HANDOFF REPORT  ED Nurse Name and Phone #: Marrio Scribner [phone]  S Name/Age/Gender [name] 89 y.o. male Room/Bed: 043C/043C  Code Status   Code Status: Full Code  Home/SNF/Other Home Patient oriented to: self Is this baseline? Yes   Triage Complete: Triage complete  Chief Complaint Fever [R50.9]  Triage Note Pt and family report long term steroid use d/t polymyalgia rheumatica. Currently taking '25mg'$  QD. Since June having decreased energy and was started on Methotrexate by rheumatologist this week. Fever Saturday and Sunday and having continued weakness. 88/30 BP at PCP office just PTA. Pt denies complaints.    Allergies Allergies  Allergen Reactions   Amoxicillin Other (See Comments)   Codeine Nausea And Vomiting   Doxycycline Other (See Comments)    Upset stomach, currently taking and tolerating well     Morphine And Related Nausea And Vomiting    Level of Care/Admitting Diagnosis ED Disposition     ED Disposition  Admit   Condition  --   [place]: [place] [[id number]]  Level of Care: Telemetry Medical [104]  May admit patient to [facility] or [facility] if equivalent level of care is available:: Yes  Covid Evaluation: Confirmed COVID Negative  Diagnosis: Fever [344092]  Admitting Physician: Wilkinson, Foster  Attending Physician: Quintella Baton [id number]  Certification:: I certify this patient will need inpatient services for at least 2 midnights  Estimated Length of Stay: 2          B Medical/Surgery History Past Medical History:  Diagnosis Date   AAA (abdominal aortic aneurysm) ([place])    Abnormal EKG    Left atrial abnormality   Allergic rhinitis    Allergy    Benign neoplasm of colon 04/14/10   3 small polyps on colonoscopy by Dr. Henrene Pastor   Carpal tunnel syndrome    Cataract    Degenerative disc disease    Disturbances metabolism of methionine, homocystine, and cystathionine (HCC)    Elevated  homocysteine   Elevated prostate specific antigen (PSA)    Hearing loss    Hyperlipidemia    Impotence of organic origin    Penile implant   Internal hemorrhoids    Neck pain    Otosclerosis of both ears    Peripheral vascular disease (HCC)    Bilateral femoral bruit   Plantar fasciitis    Polymyalgia rheumatica (HCC)    Rotator cuff syndrome of left shoulder    Scoliosis    Shoulder pain    Past Surgical History:  Procedure Laterality Date   ABDOMINAL AORTIC ENDOVASCULAR STENT GRAFT N/A 08/16/2017   Procedure: ABDOMINAL AORTIC ENDOVASCULAR STENT GRAFT insertion;  Surgeon: Serafina Mitchell, MD;  Location: [facility] OR;  Service: Vascular;  Laterality: N/A;   Actinic keratosis removal  06/21/2010   Left shoulder; Dr. Sarajane Jews   COLONOSCOPY     COLONOSCOPY W/ POLYPECTOMY     DUPUYTREN CONTRACTURE RELEASE Right 12/05/2013   Procedure: DUPUYTREN CONTRACTURE RELEASE RIGHT LONG, [place] AND SMALL FINGERS;  Surgeon: Cammie Sickle., MD;  Location: [facility];  Service: Orthopedics;  Laterality: Right;   Implant penile pump  2001   IR ANGIOGRAM PELVIS SELECTIVE OR SUPRASELECTIVE  12/11/2020   IR ANGIOGRAM SELECTIVE EACH ADDITIONAL VESSEL  12/11/2020   IR EMBO ARTERIAL NOT HEMORR HEMANG INC GUIDE ROADMAPPING  12/11/2020   IR RADIOLOGIST EVAL & MGMT  07/30/2020   IR US GUIDE VASC ACCESS RIGHT  12/11/2020  Resection of appendix and tip of rectum  February 2005   STAPEDECTOMY Bilateral 1985, [place]     A IV Location/Drains/Wounds Patient Lines/Drains/Airways Status     Active Line/Drains/Airways     Name Placement date Placement time Site Days   Peripheral IV 01/24/23 18 G Anterior;Left Forearm 01/24/23  1808  Forearm  1            Intake/Output Last 24 hours  Intake/Output Summary (Last 24 hours) at 01/25/2023 0413 Last data filed at 01/24/2023 1952 Gross per 24 hour  Intake 1300.12 ml  Output --  Net 1300.12 ml    Labs/Imaging Results for orders  placed or performed during the hospital encounter of 01/24/23 (from the past 48 hour(s))  Sedimentation rate     Status: Abnormal   Collection Time: 01/24/23  5:06 PM  Result Value Ref Range   Sed Rate 78 (H) 0 - 16 mm/hr    Comment: Performed at [facility], [address] [place], [place] [address]  TSH     Status: None   Collection Time: 01/24/23  5:06 PM  Result Value Ref Range   TSH 1.281 0.350 - 4.500 uIU/mL    Comment: Performed by a 3rd Generation assay with a functional sensitivity of <=0.01 uIU/mL. Performed at [facility], [address], [place], [place] [address]   Urinalysis, Routine w reflex microscopic -Urine, Clean Catch     Status: Abnormal   Collection Time: 01/24/23  5:30 PM  Result Value Ref Range   Color, Urine YELLOW YELLOW   APPearance CLEAR CLEAR   Specific Gravity, Urine 1.010 1.005 - 1.030   pH 6.0 5.0 - 8.0   Glucose, UA NEGATIVE NEGATIVE mg/dL   Hgb urine dipstick SMALL (A) NEGATIVE   Bilirubin Urine NEGATIVE NEGATIVE   Ketones, ur NEGATIVE NEGATIVE mg/dL   Protein, ur NEGATIVE NEGATIVE mg/dL   Nitrite NEGATIVE NEGATIVE   Leukocytes,Ua NEGATIVE NEGATIVE    Comment: Performed at [facility] [address], [place], [place] [address]  Urinalysis, Microscopic (reflex)     Status: Abnormal   Collection Time: 01/24/23  5:30 PM  Result Value Ref Range   RBC / HPF 6-10 0 - 5 RBC/hpf   WBC, UA 0-5 0 - 5 WBC/hpf   Bacteria, UA RARE (A) NONE SEEN   Squamous Epithelial / HPF 0-5 0 - 5 /HPF   Mucus PRESENT     Comment: Performed at [facility], [place] [address], [place], [place] [address]  Resp panel by RT-PCR (RSV, Flu A&B, Covid) Anterior Nasal Swab     Status: None   Collection Time: 01/24/23  5:31 PM   Specimen: Anterior Nasal Swab  Result Value Ref Range   SARS Coronavirus 2 by RT PCR NEGATIVE NEGATIVE   Influenza A by PCR NEGATIVE NEGATIVE   Influenza B by PCR NEGATIVE NEGATIVE    Comment: (NOTE) The Xpert Xpress  SARS-CoV-2/FLU/RSV plus assay is intended as an aid in the diagnosis of influenza from Nasopharyngeal swab specimens and should not be used as a sole basis for treatment. Nasal washings and aspirates are unacceptable for Xpert Xpress SARS-CoV-2/FLU/RSV testing.  Fact Sheet for Patients: EntrepreneurPulse.com.au  Fact Sheet for Healthcare Providers: IncredibleEmployment.be  This test is not yet approved or cleared by the [place] FDA and has been authorized for detection and/or diagnosis of SARS-CoV-2 by FDA under an Emergency Use Authorization (EUA). This EUA will remain in  effect (meaning this test can be used) for the duration of the COVID-19 declaration under Section 564(b)(1) of the Act, 21 U.S.C. section 360bbb-3(b)(1), unless the authorization is terminated or revoked.     Resp Syncytial Virus by PCR NEGATIVE NEGATIVE    Comment: (NOTE) Fact Sheet for Patients: EntrepreneurPulse.com.au  Fact Sheet for Healthcare Providers: IncredibleEmployment.be  This test is not yet approved or cleared by the [place] FDA and has been authorized for detection and/or diagnosis of SARS-CoV-2 by FDA under an Emergency Use Authorization (EUA). This EUA will remain in effect (meaning this test can be used) for the duration of the COVID-19 declaration under Section 564(b)(1) of the Act, 21 U.S.C. section 360bbb-3(b)(1), unless the authorization is terminated or revoked.  Performed at [facility], [place] [address], [place], [place] [address]   Lactic acid, plasma     Status: None   Collection Time: 01/24/23  5:48 PM  Result Value Ref Range   Lactic Acid, Venous 1.3 0.5 - 1.9 mmol/L    Comment: Performed at [facility] [address], [place], [place] [address]  Comprehensive metabolic panel     Status: Abnormal   Collection Time: 01/24/23  5:48 PM  Result Value Ref Range   Sodium 135 135 - 145  mmol/L   Potassium 4.4 3.5 - 5.1 mmol/L   Chloride 98 98 - 111 mmol/L   CO2 27 22 - 32 mmol/L   Glucose, Bld 148 (H) 70 - 99 mg/dL    Comment: Glucose reference range applies only to samples taken after fasting for at least 8 hours.   BUN 22 8 - 23 mg/dL   Creatinine, Ser 1.24 0.61 - 1.24 mg/dL   Calcium 8.8 (L) 8.9 - 10.3 mg/dL   Total Protein 6.7 6.5 - 8.1 g/dL   Albumin 2.6 (L) 3.5 - 5.0 g/dL   AST 45 (H) 15 - 41 U/L   ALT 71 (H) 0 - 44 U/L   Alkaline Phosphatase 87 38 - 126 U/L   Total Bilirubin 0.6 0.3 - 1.2 mg/dL   GFR, Estimated 55 (L) >60 mL/min    Comment: (NOTE) Calculated using the CKD-EPI Creatinine Equation (2021)    Anion gap 10 5 - 15    Comment: Performed at [facility], [place] [address] [place], [place] [address]  CBC with Differential     Status: Abnormal   Collection Time: 01/24/23  5:48 PM  Result Value Ref Range   WBC 14.5 (H) 4.0 - 10.5 K/uL   RBC 4.10 (L) 4.22 - 5.81 MIL/uL   Hemoglobin 12.8 (L) 13.0 - 17.0 g/dL   HCT 39.5 39.0 - 52.0 %   MCV 96.3 80.0 - 100.0 fL   MCH 31.2 26.0 - 34.0 pg   MCHC 32.4 30.0 - 36.0 g/dL   RDW 15.6 (H) 11.5 - 15.5 %   Platelets 321 150 - 400 K/uL   nRBC 0.0 0.0 - 0.2 %   Neutrophils Relative % 90 %   Neutro Abs 13.1 (H) 1.7 - 7.7 K/uL   Lymphocytes Relative 5 %   Lymphs Abs 0.7 0.7 - 4.0 K/uL   Monocytes Relative 4 %   Monocytes Absolute 0.6 0.1 - 1.0 K/uL   Eosinophils Relative 0 %   Eosinophils Absolute 0.0 0.0 - 0.5 K/uL   Basophils Relative 0 %   Basophils Absolute 0.0 0.0 - 0.1 K/uL   Immature Granulocytes 1 %   Abs Immature Granulocytes 0.12 (H)  0.00 - 0.07 K/uL    Comment: Performed at [facility], [place] [address], [place], [place] [address]  Lactic acid, plasma     Status: None   Collection Time: 01/24/23  7:56 PM  Result Value Ref Range   Lactic Acid, Venous 1.1 0.5 - 1.9 mmol/L    Comment: Performed at [facility] [address] [place], [place] [address]  CBC with  Differential/Platelet     Status: Abnormal   Collection Time: 01/25/23  3:45 AM  Result Value Ref Range   WBC 13.7 (H) 4.0 - 10.5 K/uL   RBC 3.50 (L) 4.22 - 5.81 MIL/uL   Hemoglobin 11.1 (L) 13.0 - 17.0 g/dL   HCT 32.6 (L) 39.0 - 52.0 %   MCV 93.1 80.0 - 100.0 fL   MCH 31.7 26.0 - 34.0 pg   MCHC 34.0 30.0 - 36.0 g/dL   RDW 15.7 (H) 11.5 - 15.5 %   Platelets 272 150 - 400 K/uL   nRBC 0.0 0.0 - 0.2 %   Neutrophils Relative % 86 %   Neutro Abs 11.7 (H) 1.7 - 7.7 K/uL   Lymphocytes Relative 7 %   Lymphs Abs 1.0 0.7 - 4.0 K/uL   Monocytes Relative 6 %   Monocytes Absolute 0.8 0.1 - 1.0 K/uL   Eosinophils Relative 0 %   Eosinophils Absolute 0.1 0.0 - 0.5 K/uL   Basophils Relative 0 %   Basophils Absolute 0.0 0.0 - 0.1 K/uL   Immature Granulocytes 1 %   Abs Immature Granulocytes 0.14 (H) 0.00 - 0.07 K/uL    Comment: Performed at [facility] [address], [place], [place] [address]   DG Chest 2 View  Result Date: 01/24/2023 CLINICAL DATA:  Shortness of breath. EXAM: CHEST - 2 VIEW COMPARISON:  June 12, 2018. FINDINGS: The heart size and mediastinal contours are within normal limits. Stable left basilar scarring is noted. No acute pulmonary disease is noted. The visualized skeletal structures are unremarkable. IMPRESSION: No active cardiopulmonary disease. Electronically Signed   By: Marijo Conception M.D.   On: 01/24/2023 18:02    Pending Labs Unresulted Labs (From admission, onward)     Start     Ordered   02/01/23 0500  Creatinine, serum  (enoxaparin (LOVENOX)    CrCl >/= 30 ml/min)  Weekly,   R     Comments: while on enoxaparin therapy    01/25/23 0223   01/25/23 [id number]  Basic metabolic panel  Tomorrow morning,   R        01/24/23 2155   01/24/23 1732  Blood culture (routine x 2)  BLOOD CULTURE X 2,   R (with STAT occurrences)      01/24/23 1731            Vitals/Pain Today's Vitals   01/25/23 0241 01/25/23 0300 01/25/23 0322 01/25/23 0335  BP:  (!) 148/95     Pulse:  80    Resp:  17    Temp:    97.9 F (36.6 C)  TempSrc:    Oral  SpO2:  93%    PainSc: 0-No pain  3      Isolation Precautions No active isolations  Medications Medications  ceFEPIme (MAXIPIME) 2 g in sodium chloride 0.9 % 100 mL IVPB (has no administration in time range)  tamsulosin (FLOMAX) capsule 0.8 mg (0.8 mg Oral Not Given 01/24/23 2208)  acyclovir (ZOVIRAX) tablet 400 mg (has no administration in time  range)  predniSONE (DELTASONE) tablet 5 mg (5 mg Oral Not Given 01/24/23 2209)  predniSONE (DELTASONE) tablet 20 mg (has no administration in time range)  folic acid (FOLVITE) tablet 1 mg (1 mg Oral Not Given 01/24/23 2209)  rosuvastatin (CRESTOR) tablet 5 mg (has no administration in time range)  vancomycin (VANCOCIN) IVPB 1000 mg/200 mL premix (has no administration in time range)  enoxaparin (LOVENOX) injection 40 mg (has no administration in time range)  acetaminophen (TYLENOL) tablet 650 mg (650 mg Oral Given 01/25/23 0257)    Or  acetaminophen (TYLENOL) suppository 650 mg ( Rectal See Alternative 01/25/23 0257)  senna-docusate (Senokot-S) tablet 1 tablet (has no administration in time range)  ondansetron (ZOFRAN) tablet 4 mg (has no administration in time range)    Or  ondansetron (ZOFRAN) injection 4 mg (has no administration in time range)  sodium chloride 0.9 % bolus 1,000 mL (0 mLs Intravenous Stopped 01/24/23 1951)  vancomycin (VANCOCIN) IVPB 1000 mg/200 mL premix (0 mg Intravenous Stopped 01/24/23 1952)  ceFEPIme (MAXIPIME) 2 g in sodium chloride 0.9 % 100 mL IVPB (0 g Intravenous Stopped 01/24/23 1920)  vancomycin (VANCOREADY) IVPB 500 mg/100 mL (0 mg Intravenous Stopped 01/25/23 0021)    Mobility walks     Focused Assessments Cardiac Assessment Handoff:    Lab Results  Component Value Date   CKTOTAL 153 06/22/2017   TROPONINI <0.03 06/22/2017   No results found for: "DDIMER" Does the Patient currently have chest pain? No    R Recommendations:  See Admitting Provider Note  Report given to:   Additional Notes: a and o x 4 ambulatory

## 2023-01-25 NOTE — Care Management CC44 (Signed)
Condition Code 44 Documentation Completed  Patient Details  Name: [name] MRN: [id number] Date of Birth: 10/24/1930   Condition Code 44 given:  Yes Patient signature on Condition Code 44 notice:  Yes Documentation of 2 MD's agreement:  Yes Code 44 added to claim:  Yes    Pollie Friar, RN 01/25/2023, 5:27 PM

## 2023-01-25 NOTE — Plan of Care (Signed)

## 2023-01-25 NOTE — Care Management Obs Status (Signed)
[facility] NOTIFICATION   Patient Details  Name: [name] MRN: [id number] Date of Birth: 1930/01/12   Medicare Observation Status Notification Given:  Yes    Pollie Friar, RN 01/25/2023, 5:27 PM

## 2023-01-26 LAB — LYME DISEASE SEROLOGY W/REFLEX: Lyme Total Antibody EIA: NEGATIVE

## 2023-01-26 LAB — CMV IGM: CMV IgM: <30 AU/mL (ref 0.0–29.9)

## 2023-01-26 LAB — EPSTEIN-BARR VIRUS VCA, IGG: EBV VCA IgG: <18 U/mL (ref 0.0–17.9)

## 2023-01-29 LAB — CULTURE, BLOOD (ROUTINE X 2)
Culture: NO GROWTH
Culture: NO GROWTH
Special Requests: ADEQUATE

## 2023-01-30 DIAGNOSIS — M353 Polymyalgia rheumatica: Secondary | ICD-10-CM | POA: Diagnosis not present

## 2023-01-30 DIAGNOSIS — R509 Fever, unspecified: Secondary | ICD-10-CM | POA: Diagnosis not present

## 2023-01-30 DIAGNOSIS — R5383 Other fatigue: Secondary | ICD-10-CM | POA: Diagnosis not present

## 2023-01-30 DIAGNOSIS — I714 Abdominal aortic aneurysm, without rupture, unspecified: Secondary | ICD-10-CM | POA: Diagnosis not present

## 2023-01-30 DIAGNOSIS — J329 Chronic sinusitis, unspecified: Secondary | ICD-10-CM | POA: Diagnosis not present

## 2023-01-30 NOTE — Assessment & Plan Note (Signed)
Patient has been on 5 mg of prednisone for his polymyalgia rheumatica for some time.  The viral illness that he contracted last summer triggered a flareup causing joint pain.  This was treated with increasing prednisone dose to 25 mg daily.  There appears to be some confusion in the efficacy of this treatment.  Patient's joint pain did indeed respond to elevated prednisone dose, however attempts to reduce prednisone dose led to worsening of fatigue.  Had extensive discussion with patient and wife that the fatigue and joint pain are likely unrelated.  Patient probably felt worse with prednisone decreased as prednisone can often cause a feeling of increased energy and wakefulness.  There may have been confusion between patient and providers that patient unable to wean off prednisone due to failure of therapy.  Therefore, discontinuing methotrexate.  Weaning down prednisone very slowly over the next month to get patient back to 5 mg p.o. daily.  Fatigue appears to be independent and as above recommended treatment.

## 2023-01-30 NOTE — Assessment & Plan Note (Addendum)
Had extensive discussion with patient and his wife about his fatigue.  I have a strong suspicion that he may have contracted COVID last summer and since then, has been suffering the effects of long COVID which have caused his fatigue.  (If this is indeed long COVID, discussed with patient and wife about new trials looking at treating long COVID successfully with SSRIs.  This may be something to consider as per patient's PCP.)  See below in regards to prednisone and methotrexate.  For completeness sake, ordered CMV, Epstein-Barr, [place] spotted fever and Lyme disease titers to look for other causes of fatigue.  (Addendum: Following discharge, these titers all came back negative.)

## 2023-01-30 NOTE — Assessment & Plan Note (Addendum)
During patient's entire hospitalization, was not febrile.  Unclear etiology if he has any infection.  Chest x-ray and urinalysis unremarkable.  White blood cell count elevated, but mildly and in the context of patient being on high-dose of steroids continuously.  Blood cultures drawn and patient was given 1 dose of cefepime and vancomycin.  Patient otherwise asymptomatic (other than complaints of fatigue).  Do not think patient has endocarditis.  He has no clinical signs.  Exam is unremarkable.  Additional possibility could be rheumatologic in nature?  Patient with elevated sed rate despite being on chronic high-dose steroids.  A new autoimmune inflammatory condition may cause a fever, although patient with no other symptoms.  No reason for additional antibiotics.  Will follow blood cultures and if positive will check echocardiogram.  Will plan to discharge.  Addendum: Blood cultures final result negative

## 2023-01-30 NOTE — Discharge Summary (Signed)
Physician Discharge Summary   Patient: [name] MRN: [id number] DOB: 07/31/1930  Admit date:     01/24/2023  Discharge date: 01/25/2023  Discharge Physician: Annita Brod   PCP: Crist Infante, MD   Recommendations at discharge:   Discharge medications: Slow prednisone taper and after 1 month, down to 5 mg daily Medication change: Patient will stop methotrexate which had been recently started.  Discharge Diagnoses: Principal Problem:   Fever Active Problems:   Fatigue   Polymyalgia rheumatica (HCC)   Cerebrovascular disease   S/P AAA repair   Hyperlipidemia  Resolved Problems:   * No resolved hospital problems. [facility] Course: Patient is a 89 year old male with past medical history of polymyalgia rheumatica on 5 mg of prednisone daily, hyperlipidemia and AAA who while on vacation last June (7 months ago) contracted a viral illness causing some upper respiratory symptoms.  At that time, patient not tested for any viral illnesses including COVID.  Soon after, this viral illness caused his PMR to flareup and patient's prednisone dosage increased from 5 mg to 25 mg (starting August 2023).  Patient's joint pain resolved sometime soon after.  However, since this viral illness, patient has had significant fatigue impacting his life. 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  Patient was seen by his rheumatologist on 2/21 and in attempt to wean down his prednisone, patient started on weekly methotrexate.  (Prednisone not yet weaned until methotrexate felt to be efficacious.)  Patient's wife noted that he has been having fevers that had started Saturday, 2/24.  Patient started first dose of weekly methotrexate Sunday, 2/25.  (Wife and patient are very clear as to when fevers are started and when methotrexate dose was first given, the day after fever started.)  Patient saw  his PCP on 2/27 and given new fevers, there were concerns that patient in his steroid-induced immunosuppressed state may have endocarditis and was referred to the emergency room for further evaluation.  In the emergency room, patient noted to have normal lactic acid and a white blood cell count of 14.5 as well as a sed rate of 78.  Brought in for further evaluation.  Assessment and Plan: * Fever During patient's entire hospitalization, was not febrile.  Unclear etiology if he has any infection.  Chest x-ray and urinalysis unremarkable.  White blood cell count elevated, but mildly and in the context of patient being on high-dose of steroids continuously.  Blood cultures drawn and patient was given 1 dose of cefepime and vancomycin.  Patient otherwise asymptomatic (other than complaints of fatigue).  Do not think patient has endocarditis.  He has no clinical signs.  Exam is unremarkable.  Additional possibility could be rheumatologic in nature?  Patient with elevated sed rate despite being on chronic high-dose steroids.  A new autoimmune inflammatory condition may cause a fever, although patient with no other symptoms.  No reason for additional antibiotics.  Will follow blood cultures and if positive will check echocardiogram.  Will plan to discharge.  Addendum: Blood cultures final result negative  Fatigue Had extensive discussion with patient and his wife about his fatigue.  I have a strong suspicion that he may have contracted COVID last summer and since then, has been suffering the effects of long COVID which have caused his fatigue.  (If this is indeed long COVID, discussed with patient and wife about new trials looking  at treating [place] successfully with SSRIs.  This may be something to consider as per patient's PCP.)  See below in regards to prednisone and methotrexate.  For completeness sake, ordered CMV, Epstein-Barr, [place] spotted fever and Lyme disease titers to look for other  causes of fatigue.  (Addendum: Following discharge, these titers all came back negative.)  Polymyalgia rheumatica ([place]) Patient has been on 5 mg of prednisone for his polymyalgia rheumatica for some time.  The viral illness that he contracted last summer triggered a flareup causing joint pain.  This was treated with increasing prednisone dose to 25 mg daily.  There appears to be some confusion in the efficacy of this treatment.  Patient's joint pain did indeed respond to elevated prednisone dose, however attempts to reduce prednisone dose led to worsening of fatigue.  Had extensive discussion with patient and wife that the fatigue and joint pain are likely unrelated.  Patient probably felt worse with prednisone decreased as prednisone can often cause a feeling of increased energy and wakefulness.  There may have been confusion between patient and providers that patient unable to wean off prednisone due to failure of therapy.  Therefore, discontinuing methotrexate.  Weaning down prednisone very slowly over the next month to get patient back to 5 mg p.o. daily.  Fatigue appears to be independent and as above recommended treatment.  S/P AAA repair Stable         Consultants: None Procedures performed: None Disposition: Home Diet recommendation:  Discharge Diet Orders (From admission, onward)     Start     Ordered   01/25/23 0000  Diet - low sodium heart healthy        01/25/23 1615           Heart healthy DISCHARGE MEDICATION: Allergies as of 01/25/2023       Reactions   Amoxicillin Other (See Comments)   Codeine Nausea And Vomiting   Doxycycline Other (See Comments)   Upset stomach, currently taking and tolerating well    Morphine And Related Nausea And Vomiting        Medication List     STOP taking these medications    methotrexate 2.5 MG tablet Commonly known as: RHEUMATREX       TAKE these medications    acyclovir 400 MG tablet Commonly known as:  ZOVIRAX ONE TABLET TWICE A DAY DUE TO HERPES INFECTION What changed: See the new instructions.   cholecalciferol 25 MCG (1000 UNIT) tablet Commonly known as: VITAMIN D3 Take 1,000 Units by mouth daily.   fish oil-omega-3 fatty acids 1000 MG capsule Take 1,000 mg by mouth daily.   fluorometholone 0.1 % ophthalmic suspension Commonly known as: FML Place 1 drop into the right eye at bedtime.   folic acid 1 MG tablet Commonly known as: FOLVITE Take 1 mg by mouth daily.   Metafolbic 04-28-49-5 MG Tabs TAKE ONE TABLET TWICE DAILY   MULTIVITAMIN PO Take 1 tablet by mouth daily.   predniSONE 5 MG tablet Commonly known as: DELTASONE Take 4 tablets (20 mg total) by mouth daily with breakfast for 5 days, THEN 3 tablets (15 mg total) daily with breakfast for 5 days, THEN 2 tablets (10 mg total) daily with breakfast for 5 days, THEN 1 tablet (5 mg total) daily with breakfast. Take 4 tablets by mouth in the morning and then take 1 tablet by mouth at night. Start taking on: January 25, 2023 What changed: See the new instructions.   rosuvastatin 5  MG tablet Commonly known as: CRESTOR TAKE ONE TABLET EACH DAY TO REDUCE CHOLESTEROL What changed: See the new instructions.   tamsulosin 0.4 MG Caps capsule Commonly known as: FLOMAX Take 0.8 mg by mouth at bedtime.        Follow-up Information     Crist Infante, MD Follow up.   Specialty: Internal Medicine Contact information: [place] [address] [phone]                Discharge Exam: Danley Danker Weights   01/25/23 0507  Weight: 78.7 kg   General: Alert and oriented x 3, no acute distress Cardiovascular: Regular rate and rhythm, S1-S2 Lungs: Clear to auscultation bilaterally Abdomen: Soft, nontender, nondistended, positive bowel sounds Extremities: No clubbing or cyanosis or edema  Condition at discharge: good  The results of significant diagnostics from this hospitalization (including imaging,  microbiology, ancillary and laboratory) are listed below for reference.   Imaging Studies: DG Chest 2 View  Result Date: 01/24/2023 CLINICAL DATA:  Shortness of breath. EXAM: CHEST - 2 VIEW COMPARISON:  June 12, 2018. FINDINGS: The heart size and mediastinal contours are within normal limits. Stable left basilar scarring is noted. No acute pulmonary disease is noted. The visualized skeletal structures are unremarkable. IMPRESSION: No active cardiopulmonary disease. Electronically Signed   By: Marijo Conception M.D.   On: 01/24/2023 18:02    Microbiology: Results for orders placed or performed during the hospital encounter of 01/24/23  Resp panel by RT-PCR (RSV, Flu A&B, Covid) Anterior Nasal Swab     Status: None   Collection Time: 01/24/23  5:31 PM   Specimen: Anterior Nasal Swab  Result Value Ref Range Status   SARS Coronavirus 2 by RT PCR NEGATIVE NEGATIVE Final   Influenza A by PCR NEGATIVE NEGATIVE Final   Influenza B by PCR NEGATIVE NEGATIVE Final    Comment: (NOTE) The Xpert Xpress SARS-CoV-2/FLU/RSV plus assay is intended as an aid in the diagnosis of influenza from Nasopharyngeal swab specimens and should not be used as a sole basis for treatment. Nasal washings and aspirates are unacceptable for Xpert Xpress SARS-CoV-2/FLU/RSV testing.  Fact Sheet for Patients: EntrepreneurPulse.com.au  Fact Sheet for Healthcare Providers: IncredibleEmployment.be  This test is not yet approved or cleared by the [place] FDA and has been authorized for detection and/or diagnosis of SARS-CoV-2 by FDA under an Emergency Use Authorization (EUA). This EUA will remain in effect (meaning this test can be used) for the duration of the COVID-19 declaration under Section 564(b)(1) of the Act, 21 U.S.C. section 360bbb-3(b)(1), unless the authorization is terminated or revoked.     Resp Syncytial Virus by PCR NEGATIVE NEGATIVE Final    Comment: (NOTE) Fact  Sheet for Patients: EntrepreneurPulse.com.au  Fact Sheet for Healthcare Providers: IncredibleEmployment.be  This test is not yet approved or cleared by the [place] FDA and has been authorized for detection and/or diagnosis of SARS-CoV-2 by FDA under an Emergency Use Authorization (EUA). This EUA will remain in effect (meaning this test can be used) for the duration of the COVID-19 declaration under Section 564(b)(1) of the Act, 21 U.S.C. section 360bbb-3(b)(1), unless the authorization is terminated or revoked.  Performed at [facility], [place] [address] [place], [place] [address]   Blood culture (routine x 2)     Status: None   Collection Time: 01/24/23  5:50 PM   Specimen: BLOOD RIGHT ARM  Result Value Ref Range Status   Specimen Description BLOOD RIGHT ARM  Final   Special Requests   Final    BOTTLES DRAWN AEROBIC AND ANAEROBIC Blood Culture results may not be optimal due to an inadequate volume of blood received in culture bottles   Culture   Final    NO GROWTH 5 DAYS Performed at [facility], [place] [address], [place], [place] [address]    Report Status 01/29/2023 FINAL  Final  Blood culture (routine x 2)     Status: None   Collection Time: 01/24/23  6:07 PM   Specimen: BLOOD LEFT FOREARM  Result Value Ref Range Status   Specimen Description BLOOD LEFT FOREARM  Final   Special Requests   Final    BOTTLES DRAWN AEROBIC AND ANAEROBIC Blood Culture adequate volume   Culture   Final    NO GROWTH 5 DAYS Performed at [facility], [place] [address], [place], [place] [address]    Report Status 01/29/2023 FINAL  Final    Labs: CBC: Recent Labs  Lab 01/24/23 1748 01/25/23 0345  WBC 14.5* 13.7*  NEUTROABS 13.1* 11.7*  HGB 12.8* 11.1*  HCT 39.5 32.6*  MCV 96.3 93.1  PLT 321 [id number]   Basic Metabolic Panel: Recent Labs  Lab 01/24/23 1748 01/25/23 0345  NA 135 136  K 4.4 4.0  CL 98 103  CO2 27 22  GLUCOSE 148*  106*  BUN 22 21  CREATININE 1.24 0.97  CALCIUM 8.8* 8.1*   Liver Function Tests: Recent Labs  Lab 01/24/23 1748  AST 45*  ALT 71*  ALKPHOS 87  BILITOT 0.6  PROT 6.7  ALBUMIN 2.6*   CBG: No results for input(s): "GLUCAP" in the last 168 hours.  Discharge time spent: greater than 30 minutes.  Signed: Annita Brod, MD [facility] 01/30/2023

## 2023-01-30 NOTE — Assessment & Plan Note (Signed)
Stable. 

## 2023-01-30 NOTE — Hospital Course (Signed)
Patient is a 89 year old male with past medical history of polymyalgia rheumatica on 5 mg of prednisone daily, hyperlipidemia and AAA who while on vacation last June (7 months ago) contracted a viral illness causing some upper respiratory symptoms.  At that time, patient not tested for any viral illnesses including COVID.  Soon after, this viral illness caused his PMR to flareup and patient's prednisone dosage increased from 5 mg to 25 mg (starting August 2023).  Patient's joint pain resolved sometime soon after.  However, since this viral illness, patient has had significant fatigue impacting his life. 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  Patient was seen by his rheumatologist on 2/21 and in attempt to wean down his prednisone, patient started on weekly methotrexate.  (Prednisone not yet weaned until methotrexate felt to be efficacious.)  Patient's wife noted that he has been having fevers that had started Saturday, 2/24.  Patient started first dose of weekly methotrexate Sunday, 2/25.  (Wife and patient are very clear as to when fevers are started and when methotrexate dose was first given, the day after fever started.)  Patient saw his PCP on 2/27 and given new fevers, there were concerns that patient in his steroid-induced immunosuppressed state may have endocarditis and was referred to the emergency room for further evaluation.  In the emergency room, patient noted to have normal lactic acid and a white blood cell count of 14.5 as well as a sed rate of 78.  Brought in for further evaluation.

## 2023-02-01 DIAGNOSIS — R351 Nocturia: Secondary | ICD-10-CM | POA: Diagnosis not present

## 2023-02-01 DIAGNOSIS — N401 Enlarged prostate with lower urinary tract symptoms: Secondary | ICD-10-CM | POA: Diagnosis not present

## 2023-02-01 DIAGNOSIS — R3914 Feeling of incomplete bladder emptying: Secondary | ICD-10-CM | POA: Diagnosis not present

## 2023-02-02 DIAGNOSIS — Z961 Presence of intraocular lens: Secondary | ICD-10-CM | POA: Diagnosis not present

## 2023-02-02 DIAGNOSIS — H34832 Tributary (branch) retinal vein occlusion, left eye, with macular edema: Secondary | ICD-10-CM | POA: Diagnosis not present

## 2023-02-07 DIAGNOSIS — T82390A Other mechanical complication of aortic (bifurcation) graft (replacement), initial encounter: Secondary | ICD-10-CM | POA: Diagnosis not present

## 2023-02-07 DIAGNOSIS — R5381 Other malaise: Secondary | ICD-10-CM | POA: Diagnosis not present

## 2023-02-07 DIAGNOSIS — Z880 Allergy status to penicillin: Secondary | ICD-10-CM | POA: Diagnosis not present

## 2023-02-07 DIAGNOSIS — D72829 Elevated white blood cell count, unspecified: Secondary | ICD-10-CM | POA: Diagnosis not present

## 2023-02-07 DIAGNOSIS — I3489 Other nonrheumatic mitral valve disorders: Secondary | ICD-10-CM | POA: Diagnosis not present

## 2023-02-07 DIAGNOSIS — D75839 Thrombocytosis, unspecified: Secondary | ICD-10-CM | POA: Diagnosis not present

## 2023-02-07 DIAGNOSIS — R509 Fever, unspecified: Secondary | ICD-10-CM | POA: Diagnosis not present

## 2023-02-07 DIAGNOSIS — Z95828 Presence of other vascular implants and grafts: Secondary | ICD-10-CM | POA: Diagnosis not present

## 2023-02-07 DIAGNOSIS — I714 Abdominal aortic aneurysm, without rupture, unspecified: Secondary | ICD-10-CM | POA: Diagnosis present

## 2023-02-07 DIAGNOSIS — I9789 Other postprocedural complications and disorders of the circulatory system, not elsewhere classified: Secondary | ICD-10-CM | POA: Diagnosis not present

## 2023-02-07 DIAGNOSIS — N400 Enlarged prostate without lower urinary tract symptoms: Secondary | ICD-10-CM | POA: Diagnosis present

## 2023-02-07 DIAGNOSIS — R700 Elevated erythrocyte sedimentation rate: Secondary | ICD-10-CM | POA: Diagnosis not present

## 2023-02-07 DIAGNOSIS — Z792 Long term (current) use of antibiotics: Secondary | ICD-10-CM | POA: Diagnosis not present

## 2023-02-07 DIAGNOSIS — Z7902 Long term (current) use of antithrombotics/antiplatelets: Secondary | ICD-10-CM | POA: Diagnosis not present

## 2023-02-07 DIAGNOSIS — D84821 Immunodeficiency due to drugs: Secondary | ICD-10-CM | POA: Diagnosis present

## 2023-02-07 DIAGNOSIS — M353 Polymyalgia rheumatica: Secondary | ICD-10-CM | POA: Diagnosis present

## 2023-02-07 DIAGNOSIS — D638 Anemia in other chronic diseases classified elsewhere: Secondary | ICD-10-CM | POA: Diagnosis present

## 2023-02-07 DIAGNOSIS — R5382 Chronic fatigue, unspecified: Secondary | ICD-10-CM | POA: Diagnosis not present

## 2023-02-07 DIAGNOSIS — R531 Weakness: Secondary | ICD-10-CM | POA: Diagnosis not present

## 2023-02-07 DIAGNOSIS — D649 Anemia, unspecified: Secondary | ICD-10-CM | POA: Diagnosis not present

## 2023-02-07 DIAGNOSIS — Z881 Allergy status to other antibiotic agents status: Secondary | ICD-10-CM | POA: Diagnosis not present

## 2023-02-07 DIAGNOSIS — R935 Abnormal findings on diagnostic imaging of other abdominal regions, including retroperitoneum: Secondary | ICD-10-CM | POA: Diagnosis not present

## 2023-02-07 DIAGNOSIS — Z7983 Long term (current) use of bisphosphonates: Secondary | ICD-10-CM | POA: Diagnosis not present

## 2023-02-07 DIAGNOSIS — Z66 Do not resuscitate: Secondary | ICD-10-CM | POA: Diagnosis present

## 2023-02-07 DIAGNOSIS — R918 Other nonspecific abnormal finding of lung field: Secondary | ICD-10-CM | POA: Diagnosis present

## 2023-02-07 DIAGNOSIS — I700 Atherosclerosis of aorta: Secondary | ICD-10-CM | POA: Diagnosis not present

## 2023-02-07 DIAGNOSIS — Z885 Allergy status to narcotic agent status: Secondary | ICD-10-CM | POA: Diagnosis not present

## 2023-02-07 DIAGNOSIS — Z1159 Encounter for screening for other viral diseases: Secondary | ICD-10-CM | POA: Diagnosis not present

## 2023-02-07 DIAGNOSIS — Z0389 Encounter for observation for other suspected diseases and conditions ruled out: Secondary | ICD-10-CM | POA: Diagnosis not present

## 2023-02-07 DIAGNOSIS — E785 Hyperlipidemia, unspecified: Secondary | ICD-10-CM | POA: Diagnosis present

## 2023-02-07 DIAGNOSIS — I776 Arteritis, unspecified: Secondary | ICD-10-CM | POA: Diagnosis present

## 2023-02-07 DIAGNOSIS — Z9189 Other specified personal risk factors, not elsewhere classified: Secondary | ICD-10-CM | POA: Diagnosis not present

## 2023-02-07 DIAGNOSIS — Z79899 Other long term (current) drug therapy: Secondary | ICD-10-CM | POA: Diagnosis not present

## 2023-02-07 DIAGNOSIS — Z7952 Long term (current) use of systemic steroids: Secondary | ICD-10-CM | POA: Diagnosis not present

## 2023-02-07 DIAGNOSIS — R5383 Other fatigue: Secondary | ICD-10-CM | POA: Diagnosis present

## 2023-02-07 DIAGNOSIS — Z114 Encounter for screening for human immunodeficiency virus [HIV]: Secondary | ICD-10-CM | POA: Diagnosis not present

## 2023-02-08 DIAGNOSIS — D72829 Elevated white blood cell count, unspecified: Secondary | ICD-10-CM | POA: Diagnosis not present

## 2023-02-08 DIAGNOSIS — D649 Anemia, unspecified: Secondary | ICD-10-CM | POA: Diagnosis not present

## 2023-02-09 DIAGNOSIS — Z95828 Presence of other vascular implants and grafts: Secondary | ICD-10-CM | POA: Diagnosis not present

## 2023-02-09 DIAGNOSIS — I776 Arteritis, unspecified: Secondary | ICD-10-CM | POA: Diagnosis present

## 2023-02-09 DIAGNOSIS — R935 Abnormal findings on diagnostic imaging of other abdominal regions, including retroperitoneum: Secondary | ICD-10-CM | POA: Diagnosis not present

## 2023-02-09 DIAGNOSIS — D75839 Thrombocytosis, unspecified: Secondary | ICD-10-CM | POA: Diagnosis not present

## 2023-02-09 DIAGNOSIS — Z792 Long term (current) use of antibiotics: Secondary | ICD-10-CM | POA: Diagnosis not present

## 2023-02-09 DIAGNOSIS — T82390A Other mechanical complication of aortic (bifurcation) graft (replacement), initial encounter: Secondary | ICD-10-CM | POA: Diagnosis not present

## 2023-02-09 DIAGNOSIS — D638 Anemia in other chronic diseases classified elsewhere: Secondary | ICD-10-CM | POA: Diagnosis present

## 2023-02-09 DIAGNOSIS — E785 Hyperlipidemia, unspecified: Secondary | ICD-10-CM | POA: Diagnosis present

## 2023-02-09 DIAGNOSIS — Z9189 Other specified personal risk factors, not elsewhere classified: Secondary | ICD-10-CM | POA: Diagnosis not present

## 2023-02-09 DIAGNOSIS — D84821 Immunodeficiency due to drugs: Secondary | ICD-10-CM | POA: Diagnosis present

## 2023-02-09 DIAGNOSIS — R918 Other nonspecific abnormal finding of lung field: Secondary | ICD-10-CM | POA: Diagnosis present

## 2023-02-09 DIAGNOSIS — M353 Polymyalgia rheumatica: Secondary | ICD-10-CM | POA: Diagnosis present

## 2023-02-09 DIAGNOSIS — Z881 Allergy status to other antibiotic agents status: Secondary | ICD-10-CM | POA: Diagnosis not present

## 2023-02-09 DIAGNOSIS — Z7902 Long term (current) use of antithrombotics/antiplatelets: Secondary | ICD-10-CM | POA: Diagnosis not present

## 2023-02-09 DIAGNOSIS — D649 Anemia, unspecified: Secondary | ICD-10-CM | POA: Diagnosis not present

## 2023-02-09 DIAGNOSIS — I9789 Other postprocedural complications and disorders of the circulatory system, not elsewhere classified: Secondary | ICD-10-CM | POA: Diagnosis not present

## 2023-02-09 DIAGNOSIS — Z79899 Other long term (current) drug therapy: Secondary | ICD-10-CM | POA: Diagnosis not present

## 2023-02-09 DIAGNOSIS — R5381 Other malaise: Secondary | ICD-10-CM | POA: Diagnosis not present

## 2023-02-09 DIAGNOSIS — Z66 Do not resuscitate: Secondary | ICD-10-CM | POA: Diagnosis present

## 2023-02-09 DIAGNOSIS — R5382 Chronic fatigue, unspecified: Secondary | ICD-10-CM | POA: Diagnosis not present

## 2023-02-09 DIAGNOSIS — I700 Atherosclerosis of aorta: Secondary | ICD-10-CM | POA: Diagnosis not present

## 2023-02-09 DIAGNOSIS — Z0389 Encounter for observation for other suspected diseases and conditions ruled out: Secondary | ICD-10-CM | POA: Diagnosis not present

## 2023-02-09 DIAGNOSIS — I714 Abdominal aortic aneurysm, without rupture, unspecified: Secondary | ICD-10-CM | POA: Diagnosis present

## 2023-02-09 DIAGNOSIS — N400 Enlarged prostate without lower urinary tract symptoms: Secondary | ICD-10-CM | POA: Diagnosis present

## 2023-02-09 DIAGNOSIS — Z880 Allergy status to penicillin: Secondary | ICD-10-CM | POA: Diagnosis not present

## 2023-02-09 DIAGNOSIS — D72829 Elevated white blood cell count, unspecified: Secondary | ICD-10-CM | POA: Diagnosis not present

## 2023-02-09 DIAGNOSIS — I3489 Other nonrheumatic mitral valve disorders: Secondary | ICD-10-CM | POA: Diagnosis not present

## 2023-02-09 DIAGNOSIS — R509 Fever, unspecified: Secondary | ICD-10-CM | POA: Diagnosis not present

## 2023-02-09 DIAGNOSIS — R531 Weakness: Secondary | ICD-10-CM | POA: Diagnosis not present

## 2023-02-09 DIAGNOSIS — Z7983 Long term (current) use of bisphosphonates: Secondary | ICD-10-CM | POA: Diagnosis not present

## 2023-02-09 DIAGNOSIS — Z885 Allergy status to narcotic agent status: Secondary | ICD-10-CM | POA: Diagnosis not present

## 2023-02-09 DIAGNOSIS — R5383 Other fatigue: Secondary | ICD-10-CM | POA: Diagnosis present

## 2023-02-16 DIAGNOSIS — M5430 Sciatica, unspecified side: Secondary | ICD-10-CM | POA: Diagnosis not present

## 2023-02-16 DIAGNOSIS — Z792 Long term (current) use of antibiotics: Secondary | ICD-10-CM | POA: Diagnosis not present

## 2023-02-16 DIAGNOSIS — M353 Polymyalgia rheumatica: Secondary | ICD-10-CM | POA: Diagnosis not present

## 2023-02-16 DIAGNOSIS — I776 Arteritis, unspecified: Secondary | ICD-10-CM | POA: Diagnosis not present

## 2023-02-16 DIAGNOSIS — N400 Enlarged prostate without lower urinary tract symptoms: Secondary | ICD-10-CM | POA: Diagnosis not present

## 2023-02-16 DIAGNOSIS — Z7952 Long term (current) use of systemic steroids: Secondary | ICD-10-CM | POA: Diagnosis not present

## 2023-02-16 DIAGNOSIS — D649 Anemia, unspecified: Secondary | ICD-10-CM | POA: Diagnosis not present

## 2023-02-16 DIAGNOSIS — B009 Herpesviral infection, unspecified: Secondary | ICD-10-CM | POA: Diagnosis not present

## 2023-02-16 DIAGNOSIS — Z961 Presence of intraocular lens: Secondary | ICD-10-CM | POA: Diagnosis not present

## 2023-02-16 DIAGNOSIS — H34832 Tributary (branch) retinal vein occlusion, left eye, with macular edema: Secondary | ICD-10-CM | POA: Diagnosis not present

## 2023-02-16 DIAGNOSIS — Z8616 Personal history of COVID-19: Secondary | ICD-10-CM | POA: Diagnosis not present

## 2023-02-16 DIAGNOSIS — E785 Hyperlipidemia, unspecified: Secondary | ICD-10-CM | POA: Diagnosis not present

## 2023-02-21 DIAGNOSIS — N400 Enlarged prostate without lower urinary tract symptoms: Secondary | ICD-10-CM | POA: Diagnosis not present

## 2023-02-21 DIAGNOSIS — I776 Arteritis, unspecified: Secondary | ICD-10-CM | POA: Diagnosis not present

## 2023-02-21 DIAGNOSIS — D649 Anemia, unspecified: Secondary | ICD-10-CM | POA: Diagnosis not present

## 2023-02-21 DIAGNOSIS — I714 Abdominal aortic aneurysm, without rupture, unspecified: Secondary | ICD-10-CM | POA: Diagnosis not present

## 2023-02-21 DIAGNOSIS — B009 Herpesviral infection, unspecified: Secondary | ICD-10-CM | POA: Diagnosis not present

## 2023-02-21 DIAGNOSIS — M353 Polymyalgia rheumatica: Secondary | ICD-10-CM | POA: Diagnosis not present

## 2023-02-21 DIAGNOSIS — H34832 Tributary (branch) retinal vein occlusion, left eye, with macular edema: Secondary | ICD-10-CM | POA: Diagnosis not present

## 2023-02-27 DIAGNOSIS — N400 Enlarged prostate without lower urinary tract symptoms: Secondary | ICD-10-CM | POA: Diagnosis not present

## 2023-02-27 DIAGNOSIS — M353 Polymyalgia rheumatica: Secondary | ICD-10-CM | POA: Diagnosis not present

## 2023-02-27 DIAGNOSIS — D649 Anemia, unspecified: Secondary | ICD-10-CM | POA: Diagnosis not present

## 2023-02-27 DIAGNOSIS — I776 Arteritis, unspecified: Secondary | ICD-10-CM | POA: Diagnosis not present

## 2023-02-27 DIAGNOSIS — B009 Herpesviral infection, unspecified: Secondary | ICD-10-CM | POA: Diagnosis not present

## 2023-02-27 DIAGNOSIS — H34832 Tributary (branch) retinal vein occlusion, left eye, with macular edema: Secondary | ICD-10-CM | POA: Diagnosis not present

## 2023-02-28 DIAGNOSIS — A419 Sepsis, unspecified organism: Secondary | ICD-10-CM | POA: Diagnosis not present

## 2023-02-28 DIAGNOSIS — I7123 Aneurysm of the descending thoracic aorta, without rupture: Secondary | ICD-10-CM | POA: Diagnosis not present

## 2023-02-28 DIAGNOSIS — M353 Polymyalgia rheumatica: Secondary | ICD-10-CM | POA: Diagnosis not present

## 2023-02-28 DIAGNOSIS — D472 Monoclonal gammopathy: Secondary | ICD-10-CM | POA: Diagnosis not present

## 2023-02-28 DIAGNOSIS — D72829 Elevated white blood cell count, unspecified: Secondary | ICD-10-CM | POA: Diagnosis not present

## 2023-02-28 DIAGNOSIS — I714 Abdominal aortic aneurysm, without rupture, unspecified: Secondary | ICD-10-CM | POA: Diagnosis not present

## 2023-02-28 DIAGNOSIS — R5382 Chronic fatigue, unspecified: Secondary | ICD-10-CM | POA: Diagnosis not present

## 2023-02-28 DIAGNOSIS — G629 Polyneuropathy, unspecified: Secondary | ICD-10-CM | POA: Diagnosis not present

## 2023-03-02 DIAGNOSIS — M353 Polymyalgia rheumatica: Secondary | ICD-10-CM | POA: Diagnosis not present

## 2023-03-02 DIAGNOSIS — H34832 Tributary (branch) retinal vein occlusion, left eye, with macular edema: Secondary | ICD-10-CM | POA: Diagnosis not present

## 2023-03-02 DIAGNOSIS — D649 Anemia, unspecified: Secondary | ICD-10-CM | POA: Diagnosis not present

## 2023-03-02 DIAGNOSIS — I776 Arteritis, unspecified: Secondary | ICD-10-CM | POA: Diagnosis not present

## 2023-03-02 DIAGNOSIS — N400 Enlarged prostate without lower urinary tract symptoms: Secondary | ICD-10-CM | POA: Diagnosis not present

## 2023-03-02 DIAGNOSIS — B009 Herpesviral infection, unspecified: Secondary | ICD-10-CM | POA: Diagnosis not present

## 2023-03-05 ENCOUNTER — Emergency Department (HOSPITAL_COMMUNITY): Payer: Medicare Other

## 2023-03-05 ENCOUNTER — Encounter (HOSPITAL_COMMUNITY): Payer: Self-pay

## 2023-03-05 ENCOUNTER — Inpatient Hospital Stay (HOSPITAL_COMMUNITY)
Admission: EM | Admit: 2023-03-05 | Discharge: 2023-03-23 | DRG: 175 | Disposition: A | Discharge status: Skilled Nursing Facility | Payer: Medicare Other | Attending: Internal Medicine | Admitting: Internal Medicine

## 2023-03-05 ENCOUNTER — Other Ambulatory Visit: Payer: Self-pay

## 2023-03-05 DIAGNOSIS — E782 Mixed hyperlipidemia: Secondary | ICD-10-CM | POA: Diagnosis not present

## 2023-03-05 DIAGNOSIS — B370 Candidal stomatitis: Secondary | ICD-10-CM | POA: Diagnosis not present

## 2023-03-05 DIAGNOSIS — I2699 Other pulmonary embolism without acute cor pulmonale: Secondary | ICD-10-CM | POA: Diagnosis not present

## 2023-03-05 DIAGNOSIS — J80 Acute respiratory distress syndrome: Secondary | ICD-10-CM | POA: Diagnosis not present

## 2023-03-05 DIAGNOSIS — I2489 Other forms of acute ischemic heart disease: Secondary | ICD-10-CM | POA: Diagnosis present

## 2023-03-05 DIAGNOSIS — F05 Delirium due to known physiological condition: Secondary | ICD-10-CM | POA: Diagnosis not present

## 2023-03-05 DIAGNOSIS — H9190 Unspecified hearing loss, unspecified ear: Secondary | ICD-10-CM | POA: Diagnosis present

## 2023-03-05 DIAGNOSIS — G4700 Insomnia, unspecified: Secondary | ICD-10-CM

## 2023-03-05 DIAGNOSIS — I82451 Acute embolism and thrombosis of right peroneal vein: Secondary | ICD-10-CM | POA: Diagnosis not present

## 2023-03-05 DIAGNOSIS — I714 Abdominal aortic aneurysm, without rupture, unspecified: Secondary | ICD-10-CM | POA: Diagnosis not present

## 2023-03-05 DIAGNOSIS — G9341 Metabolic encephalopathy: Secondary | ICD-10-CM | POA: Diagnosis not present

## 2023-03-05 DIAGNOSIS — R531 Weakness: Secondary | ICD-10-CM | POA: Diagnosis not present

## 2023-03-05 DIAGNOSIS — J189 Pneumonia, unspecified organism: Secondary | ICD-10-CM

## 2023-03-05 DIAGNOSIS — I471 Supraventricular tachycardia, unspecified: Secondary | ICD-10-CM | POA: Diagnosis not present

## 2023-03-05 DIAGNOSIS — L8990 Pressure ulcer of unspecified site, unspecified stage: Secondary | ICD-10-CM | POA: Insufficient documentation

## 2023-03-05 DIAGNOSIS — I2609 Other pulmonary embolism with acute cor pulmonale: Secondary | ICD-10-CM | POA: Diagnosis not present

## 2023-03-05 DIAGNOSIS — M353 Polymyalgia rheumatica: Secondary | ICD-10-CM | POA: Diagnosis present

## 2023-03-05 DIAGNOSIS — I776 Arteritis, unspecified: Secondary | ICD-10-CM

## 2023-03-05 DIAGNOSIS — J849 Interstitial pulmonary disease, unspecified: Secondary | ICD-10-CM | POA: Diagnosis present

## 2023-03-05 DIAGNOSIS — Z95828 Presence of other vascular implants and grafts: Secondary | ICD-10-CM

## 2023-03-05 DIAGNOSIS — K2970 Gastritis, unspecified, without bleeding: Secondary | ICD-10-CM | POA: Diagnosis present

## 2023-03-05 DIAGNOSIS — R7989 Other specified abnormal findings of blood chemistry: Secondary | ICD-10-CM | POA: Diagnosis not present

## 2023-03-05 DIAGNOSIS — I4892 Unspecified atrial flutter: Secondary | ICD-10-CM

## 2023-03-05 DIAGNOSIS — Z66 Do not resuscitate: Secondary | ICD-10-CM | POA: Diagnosis not present

## 2023-03-05 DIAGNOSIS — E8809 Other disorders of plasma-protein metabolism, not elsewhere classified: Secondary | ICD-10-CM | POA: Diagnosis present

## 2023-03-05 DIAGNOSIS — E785 Hyperlipidemia, unspecified: Secondary | ICD-10-CM | POA: Diagnosis present

## 2023-03-05 DIAGNOSIS — Z7952 Long term (current) use of systemic steroids: Secondary | ICD-10-CM

## 2023-03-05 DIAGNOSIS — E222 Syndrome of inappropriate secretion of antidiuretic hormone: Secondary | ICD-10-CM | POA: Diagnosis not present

## 2023-03-05 DIAGNOSIS — Z825 Family history of asthma and other chronic lower respiratory diseases: Secondary | ICD-10-CM

## 2023-03-05 DIAGNOSIS — J8281 Chronic eosinophilic pneumonia: Secondary | ICD-10-CM

## 2023-03-05 DIAGNOSIS — J8282 Acute eosinophilic pneumonia: Secondary | ICD-10-CM | POA: Diagnosis not present

## 2023-03-05 DIAGNOSIS — Z881 Allergy status to other antibiotic agents status: Secondary | ICD-10-CM

## 2023-03-05 DIAGNOSIS — J9690 Respiratory failure, unspecified, unspecified whether with hypoxia or hypercapnia: Secondary | ICD-10-CM | POA: Diagnosis not present

## 2023-03-05 DIAGNOSIS — I739 Peripheral vascular disease, unspecified: Secondary | ICD-10-CM | POA: Diagnosis present

## 2023-03-05 DIAGNOSIS — Y95 Nosocomial condition: Secondary | ICD-10-CM | POA: Diagnosis present

## 2023-03-05 DIAGNOSIS — L89151 Pressure ulcer of sacral region, stage 1: Secondary | ICD-10-CM | POA: Diagnosis not present

## 2023-03-05 DIAGNOSIS — R0602 Shortness of breath: Secondary | ICD-10-CM | POA: Diagnosis not present

## 2023-03-05 DIAGNOSIS — I10 Essential (primary) hypertension: Secondary | ICD-10-CM | POA: Diagnosis not present

## 2023-03-05 DIAGNOSIS — D84821 Immunodeficiency due to drugs: Secondary | ICD-10-CM | POA: Diagnosis not present

## 2023-03-05 DIAGNOSIS — R410 Disorientation, unspecified: Secondary | ICD-10-CM | POA: Diagnosis not present

## 2023-03-05 DIAGNOSIS — Z885 Allergy status to narcotic agent status: Secondary | ICD-10-CM

## 2023-03-05 DIAGNOSIS — F19982 Other psychoactive substance use, unspecified with psychoactive substance-induced sleep disorder: Secondary | ICD-10-CM

## 2023-03-05 DIAGNOSIS — Z7401 Bed confinement status: Secondary | ICD-10-CM | POA: Diagnosis not present

## 2023-03-05 DIAGNOSIS — I480 Paroxysmal atrial fibrillation: Secondary | ICD-10-CM | POA: Diagnosis not present

## 2023-03-05 DIAGNOSIS — R0689 Other abnormalities of breathing: Secondary | ICD-10-CM | POA: Diagnosis not present

## 2023-03-05 DIAGNOSIS — E871 Hypo-osmolality and hyponatremia: Secondary | ICD-10-CM | POA: Diagnosis not present

## 2023-03-05 DIAGNOSIS — R110 Nausea: Secondary | ICD-10-CM | POA: Diagnosis not present

## 2023-03-05 DIAGNOSIS — R509 Fever, unspecified: Secondary | ICD-10-CM

## 2023-03-05 DIAGNOSIS — D638 Anemia in other chronic diseases classified elsewhere: Secondary | ICD-10-CM | POA: Diagnosis not present

## 2023-03-05 DIAGNOSIS — Z8249 Family history of ischemic heart disease and other diseases of the circulatory system: Secondary | ICD-10-CM

## 2023-03-05 DIAGNOSIS — Z1152 Encounter for screening for COVID-19: Secondary | ICD-10-CM | POA: Diagnosis not present

## 2023-03-05 DIAGNOSIS — F419 Anxiety disorder, unspecified: Secondary | ICD-10-CM | POA: Diagnosis not present

## 2023-03-05 DIAGNOSIS — I824Z1 Acute embolism and thrombosis of unspecified deep veins of right distal lower extremity: Secondary | ICD-10-CM | POA: Diagnosis not present

## 2023-03-05 DIAGNOSIS — K219 Gastro-esophageal reflux disease without esophagitis: Secondary | ICD-10-CM | POA: Diagnosis present

## 2023-03-05 DIAGNOSIS — D72829 Elevated white blood cell count, unspecified: Secondary | ICD-10-CM

## 2023-03-05 DIAGNOSIS — E872 Acidosis, unspecified: Secondary | ICD-10-CM | POA: Diagnosis not present

## 2023-03-05 DIAGNOSIS — R4182 Altered mental status, unspecified: Secondary | ICD-10-CM | POA: Diagnosis not present

## 2023-03-05 DIAGNOSIS — G4709 Other insomnia: Secondary | ICD-10-CM | POA: Diagnosis not present

## 2023-03-05 DIAGNOSIS — R0600 Dyspnea, unspecified: Secondary | ICD-10-CM | POA: Diagnosis not present

## 2023-03-05 DIAGNOSIS — I483 Typical atrial flutter: Secondary | ICD-10-CM | POA: Diagnosis not present

## 2023-03-05 DIAGNOSIS — R5381 Other malaise: Secondary | ICD-10-CM | POA: Diagnosis not present

## 2023-03-05 DIAGNOSIS — R0902 Hypoxemia: Secondary | ICD-10-CM | POA: Diagnosis not present

## 2023-03-05 DIAGNOSIS — I82409 Acute embolism and thrombosis of unspecified deep veins of unspecified lower extremity: Secondary | ICD-10-CM | POA: Diagnosis not present

## 2023-03-05 DIAGNOSIS — I5032 Chronic diastolic (congestive) heart failure: Secondary | ICD-10-CM | POA: Diagnosis present

## 2023-03-05 DIAGNOSIS — Z79899 Other long term (current) drug therapy: Secondary | ICD-10-CM

## 2023-03-05 DIAGNOSIS — J9601 Acute respiratory failure with hypoxia: Secondary | ICD-10-CM

## 2023-03-05 DIAGNOSIS — J9611 Chronic respiratory failure with hypoxia: Secondary | ICD-10-CM | POA: Diagnosis present

## 2023-03-05 DIAGNOSIS — Z7189 Other specified counseling: Secondary | ICD-10-CM | POA: Diagnosis not present

## 2023-03-05 DIAGNOSIS — I4891 Unspecified atrial fibrillation: Secondary | ICD-10-CM | POA: Diagnosis not present

## 2023-03-05 DIAGNOSIS — T380X5D Adverse effect of glucocorticoids and synthetic analogues, subsequent encounter: Secondary | ICD-10-CM | POA: Diagnosis not present

## 2023-03-05 DIAGNOSIS — R7303 Prediabetes: Secondary | ICD-10-CM | POA: Diagnosis present

## 2023-03-05 DIAGNOSIS — N400 Enlarged prostate without lower urinary tract symptoms: Secondary | ICD-10-CM | POA: Diagnosis not present

## 2023-03-05 DIAGNOSIS — Z7901 Long term (current) use of anticoagulants: Secondary | ICD-10-CM | POA: Diagnosis not present

## 2023-03-05 DIAGNOSIS — T380X5A Adverse effect of glucocorticoids and synthetic analogues, initial encounter: Secondary | ICD-10-CM | POA: Diagnosis present

## 2023-03-05 DIAGNOSIS — Z888 Allergy status to other drugs, medicaments and biological substances status: Secondary | ICD-10-CM

## 2023-03-05 DIAGNOSIS — I484 Atypical atrial flutter: Secondary | ICD-10-CM | POA: Diagnosis not present

## 2023-03-05 DIAGNOSIS — Z7960 Long term (current) use of unspecified immunomodulators and immunosuppressants: Secondary | ICD-10-CM

## 2023-03-05 DIAGNOSIS — J90 Pleural effusion, not elsewhere classified: Secondary | ICD-10-CM | POA: Diagnosis not present

## 2023-03-05 LAB — URINALYSIS, W/ REFLEX TO CULTURE (INFECTION SUSPECTED)
Bacteria, UA: NONE SEEN
Bilirubin Urine: NEGATIVE
Glucose, UA: NEGATIVE mg/dL
Ketones, ur: NEGATIVE mg/dL
Leukocytes,Ua: NEGATIVE
Nitrite: NEGATIVE
Protein, ur: 100 mg/dL — AB
Specific Gravity, Urine: 1.017 (ref 1.005–1.030)
pH: 6 (ref 5.0–8.0)

## 2023-03-05 LAB — TROPONIN I (HIGH SENSITIVITY)
Troponin I (High Sensitivity): 36 ng/L — ABNORMAL HIGH (ref <18)
Troponin I (High Sensitivity): 54 ng/L — ABNORMAL HIGH (ref <18)

## 2023-03-05 LAB — COMPREHENSIVE METABOLIC PANEL
ALT: 31 U/L (ref 0–44)
AST: 32 U/L (ref 15–41)
Albumin: 2.2 g/dL — ABNORMAL LOW (ref 3.5–5.0)
Alkaline Phosphatase: 75 U/L (ref 38–126)
Anion gap: 10 (ref 5–15)
BUN: 15 mg/dL (ref 8–23)
CO2: 22 mmol/L (ref 22–32)
Calcium: 7.8 mg/dL — ABNORMAL LOW (ref 8.9–10.3)
Chloride: 99 mmol/L (ref 98–111)
Creatinine, Ser: 1.17 mg/dL (ref 0.61–1.24)
GFR, Estimated: 59 mL/min — ABNORMAL LOW (ref >60)
Glucose, Bld: 131 mg/dL — ABNORMAL HIGH (ref 70–99)
Potassium: 4.1 mmol/L (ref 3.5–5.1)
Sodium: 131 mmol/L — ABNORMAL LOW (ref 135–145)
Total Bilirubin: 0.5 mg/dL (ref 0.3–1.2)
Total Protein: 6 g/dL — ABNORMAL LOW (ref 6.5–8.1)

## 2023-03-05 LAB — RESP PANEL BY RT-PCR (RSV, FLU A&B, COVID)  RVPGX2
Influenza A by PCR: NEGATIVE
Influenza B by PCR: NEGATIVE
Resp Syncytial Virus by PCR: NEGATIVE
SARS Coronavirus 2 by RT PCR: NEGATIVE

## 2023-03-05 LAB — CBC WITH DIFFERENTIAL/PLATELET
Abs Immature Granulocytes: 0.2 10*3/uL — ABNORMAL HIGH (ref 0.00–0.07)
Basophils Absolute: 0.2 10*3/uL — ABNORMAL HIGH (ref 0.0–0.1)
Basophils Relative: 1 %
Eosinophils Absolute: 0.2 10*3/uL (ref 0.0–0.5)
Eosinophils Relative: 1 %
HCT: 31.4 % — ABNORMAL LOW (ref 39.0–52.0)
Hemoglobin: 10.2 g/dL — ABNORMAL LOW (ref 13.0–17.0)
Immature Granulocytes: 1 %
Lymphocytes Relative: 3 %
Lymphs Abs: 0.6 10*3/uL — ABNORMAL LOW (ref 0.7–4.0)
MCH: 31.2 pg (ref 26.0–34.0)
MCHC: 32.5 g/dL (ref 30.0–36.0)
MCV: 96 fL (ref 80.0–100.0)
Monocytes Absolute: 1 10*3/uL (ref 0.1–1.0)
Monocytes Relative: 5 %
Neutro Abs: 17.8 10*3/uL — ABNORMAL HIGH (ref 1.7–7.7)
Neutrophils Relative %: 89 %
Platelets: 320 10*3/uL (ref 150–400)
RBC: 3.27 MIL/uL — ABNORMAL LOW (ref 4.22–5.81)
RDW: 16 % — ABNORMAL HIGH (ref 11.5–15.5)
WBC: 20 10*3/uL — ABNORMAL HIGH (ref 4.0–10.5)
nRBC: 0 % (ref 0.0–0.2)

## 2023-03-05 LAB — OSMOLALITY, URINE: Osmolality, Ur: 473 mOsm/kg (ref 300–900)

## 2023-03-05 LAB — I-STAT CHEM 8, ED
BUN: 15 mg/dL (ref 8–23)
Calcium, Ion: 1.08 mmol/L — ABNORMAL LOW (ref 1.15–1.40)
Chloride: 98 mmol/L (ref 98–111)
Creatinine, Ser: 1.1 mg/dL (ref 0.61–1.24)
Glucose, Bld: 130 mg/dL — ABNORMAL HIGH (ref 70–99)
HCT: 31 % — ABNORMAL LOW (ref 39.0–52.0)
Hemoglobin: 10.5 g/dL — ABNORMAL LOW (ref 13.0–17.0)
Potassium: 4.1 mmol/L (ref 3.5–5.1)
Sodium: 131 mmol/L — ABNORMAL LOW (ref 135–145)
TCO2: 24 mmol/L (ref 22–32)

## 2023-03-05 LAB — SODIUM, URINE, RANDOM: Sodium, Ur: 37 mmol/L

## 2023-03-05 LAB — SEDIMENTATION RATE: Sed Rate: 105 mm/hr — ABNORMAL HIGH (ref 0–16)

## 2023-03-05 LAB — CREATININE, URINE, RANDOM: Creatinine, Urine: 138 mg/dL

## 2023-03-05 LAB — MAGNESIUM: Magnesium: 1.9 mg/dL (ref 1.7–2.4)

## 2023-03-05 LAB — VITAMIN B12: Vitamin B-12: 1399 pg/mL — ABNORMAL HIGH (ref 180–914)

## 2023-03-05 LAB — OSMOLALITY: Osmolality: 278 mOsm/kg (ref 275–295)

## 2023-03-05 LAB — BRAIN NATRIURETIC PEPTIDE: B Natriuretic Peptide: 121.7 pg/mL — ABNORMAL HIGH (ref 0.0–100.0)

## 2023-03-05 LAB — CK: Total CK: 106 U/L (ref 49–397)

## 2023-03-05 LAB — LACTIC ACID, PLASMA: Lactic Acid, Venous: 2.6 mmol/L (ref 0.5–1.9)

## 2023-03-05 LAB — CBG MONITORING, ED: Glucose-Capillary: 136 mg/dL — ABNORMAL HIGH (ref 70–99)

## 2023-03-05 LAB — TSH: TSH: 1.869 u[IU]/mL (ref 0.350–4.500)

## 2023-03-05 LAB — C-REACTIVE PROTEIN: CRP: 21.8 mg/dL — ABNORMAL HIGH (ref <1.0)

## 2023-03-05 LAB — STREP PNEUMONIAE URINARY ANTIGEN: Strep Pneumo Urinary Antigen: NEGATIVE

## 2023-03-05 MED ORDER — ROSUVASTATIN CALCIUM 5 MG PO TABS
5.0000 mg | ORAL_TABLET | Freq: Every day | ORAL | Status: DC
  Administered 2023-03-11 – 2023-03-17 (×6): 5 mg via ORAL
  Filled 2023-03-05 (×11): qty 1

## 2023-03-05 MED ORDER — VANCOMYCIN HCL 1500 MG/300ML IV SOLN
1500.0000 mg | Freq: Once | INTRAVENOUS | Status: DC
  Filled 2023-03-05: qty 300

## 2023-03-05 MED ORDER — SODIUM CHLORIDE 0.9% FLUSH
10.0000 mL | INTRAVENOUS | Status: DC | PRN
  Administered 2023-03-20 (×2): 10 mL

## 2023-03-05 MED ORDER — SODIUM CHLORIDE 0.9 % IV BOLUS
500.0000 mL | Freq: Once | INTRAVENOUS | Status: AC
  Administered 2023-03-05: 500 mL via INTRAVENOUS

## 2023-03-05 MED ORDER — SULFAMETHOXAZOLE-TRIMETHOPRIM 800-160 MG PO TABS
1.0000 | ORAL_TABLET | ORAL | Status: DC
  Administered 2023-03-06 – 2023-03-22 (×8): 1 via ORAL
  Filled 2023-03-05 (×8): qty 1

## 2023-03-05 MED ORDER — SODIUM CHLORIDE 0.9% FLUSH
10.0000 mL | Freq: Two times a day (BID) | INTRAVENOUS | Status: DC
  Administered 2023-03-05: 20 mL
  Administered 2023-03-06 – 2023-03-18 (×21): 10 mL
  Administered 2023-03-18: 20 mL
  Administered 2023-03-19 – 2023-03-23 (×8): 10 mL

## 2023-03-05 MED ORDER — HYDROCORTISONE SOD SUC (PF) 100 MG IJ SOLR
100.0000 mg | Freq: Once | INTRAMUSCULAR | Status: AC
  Administered 2023-03-05: 100 mg via INTRAVENOUS
  Filled 2023-03-05: qty 2

## 2023-03-05 MED ORDER — FLUOROMETHOLONE 0.1 % OP SUSP
1.0000 [drp] | Freq: Every day | OPHTHALMIC | Status: DC
  Administered 2023-03-05 – 2023-03-22 (×14): 1 [drp] via OPHTHALMIC
  Filled 2023-03-05 (×2): qty 5

## 2023-03-05 MED ORDER — HEPARIN BOLUS VIA INFUSION
4000.0000 [IU] | Freq: Once | INTRAVENOUS | Status: AC
  Administered 2023-03-05: 4000 [IU] via INTRAVENOUS
  Filled 2023-03-05: qty 4000

## 2023-03-05 MED ORDER — SODIUM CHLORIDE 0.9 % IV SOLN
2.0000 g | Freq: Two times a day (BID) | INTRAVENOUS | Status: DC
  Administered 2023-03-06 – 2023-03-10 (×9): 2 g via INTRAVENOUS
  Filled 2023-03-05 (×9): qty 12.5

## 2023-03-05 MED ORDER — FOLIC ACID 1 MG PO TABS
1.0000 mg | ORAL_TABLET | Freq: Every day | ORAL | Status: DC
  Administered 2023-03-06 – 2023-03-23 (×18): 1 mg via ORAL
  Filled 2023-03-05 (×18): qty 1

## 2023-03-05 MED ORDER — ACETAMINOPHEN 650 MG RE SUPP: 650.0000 mg | Freq: Four times a day (QID) | RECTAL | Status: DC | PRN

## 2023-03-05 MED ORDER — IOHEXOL 350 MG/ML SOLN
75.0000 mL | Freq: Once | INTRAVENOUS | Status: AC | PRN
  Administered 2023-03-05: 75 mL via INTRAVENOUS

## 2023-03-05 MED ORDER — LACTATED RINGERS IV SOLN: INTRAVENOUS | Status: DC

## 2023-03-05 MED ORDER — HEPARIN (PORCINE) 25000 UT/250ML-% IV SOLN
1250.0000 [IU]/h | INTRAVENOUS | Status: DC
  Administered 2023-03-05: 1250 [IU]/h via INTRAVENOUS
  Filled 2023-03-05: qty 250

## 2023-03-05 MED ORDER — SODIUM CHLORIDE 0.9 % IV SOLN
2.0000 g | Freq: Once | INTRAVENOUS | Status: AC
  Administered 2023-03-05: 2 g via INTRAVENOUS
  Filled 2023-03-05 (×2): qty 12.5

## 2023-03-05 MED ORDER — PREDNISONE 5 MG PO TABS
15.0000 mg | ORAL_TABLET | Freq: Every day | ORAL | Status: DC
  Administered 2023-03-06: 15 mg via ORAL
  Filled 2023-03-05: qty 1

## 2023-03-05 MED ORDER — SODIUM CHLORIDE 0.9 % IV SOLN
600.0000 mg | Freq: Every day | INTRAVENOUS | Status: DC
  Administered 2023-03-05: 600 mg via INTRAVENOUS
  Filled 2023-03-05 (×2): qty 12

## 2023-03-05 MED ORDER — CHLORHEXIDINE GLUCONATE CLOTH 2 % EX PADS
6.0000 | MEDICATED_PAD | Freq: Every day | CUTANEOUS | Status: DC
  Administered 2023-03-06 – 2023-03-23 (×18): 6 via TOPICAL

## 2023-03-05 MED ORDER — ONDANSETRON HCL 4 MG/2ML IJ SOLN
4.0000 mg | Freq: Four times a day (QID) | INTRAMUSCULAR | Status: DC | PRN
  Administered 2023-03-08 – 2023-03-23 (×7): 4 mg via INTRAVENOUS
  Filled 2023-03-05 (×8): qty 2

## 2023-03-05 MED ORDER — SODIUM CHLORIDE 0.9 % IV BOLUS
1000.0000 mL | Freq: Once | INTRAVENOUS | Status: AC
  Administered 2023-03-05: 1000 mL via INTRAVENOUS

## 2023-03-05 MED ORDER — MELATONIN 3 MG PO TABS
3.0000 mg | ORAL_TABLET | Freq: Every evening | ORAL | Status: DC | PRN
  Administered 2023-03-07: 3 mg via ORAL
  Filled 2023-03-05 (×2): qty 1

## 2023-03-05 MED ORDER — ACETAMINOPHEN 325 MG PO TABS
650.0000 mg | ORAL_TABLET | Freq: Four times a day (QID) | ORAL | Status: DC | PRN
  Administered 2023-03-06 – 2023-03-23 (×16): 650 mg via ORAL
  Filled 2023-03-05 (×18): qty 2

## 2023-03-05 NOTE — ED Notes (Signed)
MD notified of pt bladder scan volume and that pt was only able to urinate 50 ml

## 2023-03-05 NOTE — ED Provider Notes (Signed)
[place] [facility] [place] Provider [facility]   CSN: [id number] Arrival date & time: 03/05/23  1558     History  Chief Complaint  Patient presents with   Code Sepsis    Milda Smartdwin S Ballen is a 89 y.o. male.  89 year old male with prior medical history as detailed below presents for evaluation.  Patient with increased weakness over the last 24 hours.  Patient with inability to walk today per family.  Patient with complex medical history including known issues with PMR.  [facility] with [facility].  [facility], patient was started on at least a 3-week course of daptomycin, ceftriaxone (possible aortitis) and Bactrim (PJP prophylaxis).  Patient has been compliant with these medications.  Please see documentation in Care eEverywhere.  Patient has been on prednisone orally for decades for treatment of PMR.  Recently dose was decreased from 20 mg daily to 15 mg daily.  Patient denies pain.  Patient is alert appears comfortable.  He complains of generalized weakness.  He specifically denies chest pain, shortness of breath, nausea, vomiting, abdominal pain.  The history is provided by the patient, medical records and the spouse.       Home Medications Prior to Admission medications   Medication Sig Start Date End Date Taking? Authorizing Provider  acyclovir (ZOVIRAX) 400 MG tablet ONE TABLET TWICE A DAY DUE TO HERPES INFECTION Patient taking differently: Take 400 mg by mouth 2 (two) times daily. 01/22/18   Sharon SellerEubanks, Jessica K, NP  cholecalciferol (VITAMIN D3) 25 MCG (1000 UNIT) tablet Take 1,000 Units by mouth daily.    [provider]  fish oil-omega-3 fatty acids 1000 MG capsule Take 1,000 mg by mouth daily.     [provider]  fluorometholone (FML) 0.1 % ophthalmic suspension Place 1 drop into the right eye at bedtime.     [provider]  folic acid (FOLVITE) 1 MG tablet Take 1 mg by mouth daily.    [provider]  L-Methylfolate-B12-B6-B2 (METAFOLBIC) 04-28-49-5 MG TABS TAKE ONE TABLET TWICE DAILY Patient not taking: Reported on 01/24/2023 08/18/17   Sharon SellerEubanks, Jessica K, NP  Multiple Vitamins-Minerals (MULTIVITAMIN PO) Take 1 tablet by mouth daily.     [provider]  predniSONE (DELTASONE) 5 MG tablet Take 4 tablets (20 mg total) by mouth daily with breakfast for 5 days, THEN 3 tablets (15 mg total) daily with breakfast for 5 days, THEN 2 tablets (10 mg total) daily with breakfast for 5 days, THEN 1 tablet (5 mg total) daily with breakfast. Take 4 tablets by mouth in the morning and then take 1 tablet by mouth at night. 01/25/23 03/11/23  Hollice EspyKrishnan, Sendil K, MD  rosuvastatin (CRESTOR) 5 MG tablet TAKE ONE TABLET EACH DAY TO REDUCE CHOLESTEROL Patient taking differently: Take 5 mg by mouth at bedtime. 09/01/17   Sharon SellerEubanks, Jessica K, NP  tamsulosin (FLOMAX) 0.4 MG CAPS capsule Take 0.8 mg by mouth at bedtime. 06/10/20   [provider]      Allergies    Amoxicillin, Codeine, Doxycycline, and Morphine and related    Review of Systems   Review of Systems  All other systems reviewed and are negative.   Physical Exam Updated Vital Signs BP 94/66   Pulse 100   Temp 99.8 F (37.7 C) (Oral)   Resp (!) 26   Ht 6' (1.829 m)   Wt 78.7 kg   SpO2 95%   BMI 23.53 kg/m  Physical  Exam Vitals and nursing note reviewed.  Constitutional:      General: He is not in acute distress.    Appearance: Normal appearance. He is well-developed.  HENT:     Head: Normocephalic and atraumatic.  Eyes:     Conjunctiva/sclera: Conjunctivae normal.     Pupils: Pupils are equal, round, and reactive to light.  Cardiovascular:     Rate and Rhythm: Normal rate and regular rhythm.     Heart sounds: Normal heart sounds.  Pulmonary:     Effort: Pulmonary effort is normal. No respiratory distress.     Breath sounds: Normal breath sounds.  Abdominal:     General: There is no distension.      Palpations: Abdomen is soft.     Tenderness: There is no abdominal tenderness.  Musculoskeletal:        General: No deformity. Normal range of motion.     Cervical back: Normal range of motion and neck supple.  Skin:    General: Skin is warm and dry.  Neurological:     General: No focal deficit present.     Mental Status: He is alert and oriented to person, place, and time.     ED Results / Procedures / Treatments   Labs (all labs ordered are listed, but only abnormal results are displayed) Labs Reviewed  CBC WITH DIFFERENTIAL/PLATELET - Abnormal; Notable for the following components:      Result Value   WBC 20.0 (*)    RBC 3.27 (*)    Hemoglobin 10.2 (*)    HCT 31.4 (*)    RDW 16.0 (*)    Neutro Abs 17.8 (*)    Lymphs Abs 0.6 (*)    Basophils Absolute 0.2 (*)    Abs Immature Granulocytes 0.20 (*)    All other components within normal limits  LACTIC ACID, PLASMA - Abnormal; Notable for the following components:   Lactic Acid, Venous 2.6 (*)    All other components within normal limits  COMPREHENSIVE METABOLIC PANEL - Abnormal; Notable for the following components:   Sodium 131 (*)    Glucose, Bld 131 (*)    Calcium 7.8 (*)    Total Protein 6.0 (*)    Albumin 2.2 (*)    GFR, Estimated 59 (*)    All other components within normal limits  I-STAT CHEM 8, ED - Abnormal; Notable for the following components:   Sodium 131 (*)    Glucose, Bld 130 (*)    Calcium, Ion 1.08 (*)    Hemoglobin 10.5 (*)    HCT 31.0 (*)    All other components within normal limits  CBG MONITORING, ED - Abnormal; Notable for the following components:   Glucose-Capillary 136 (*)    All other components within normal limits  TROPONIN I (HIGH SENSITIVITY) - Abnormal; Notable for the following components:   Troponin I (High Sensitivity) 36 (*)    All other components within normal limits  RESP PANEL BY RT-PCR (RSV, FLU A&B, COVID)  RVPGX2  CULTURE, BLOOD (ROUTINE X 2)  CULTURE, BLOOD (ROUTINE X 2)   LACTIC ACID, PLASMA  URINALYSIS, W/ REFLEX TO CULTURE (INFECTION SUSPECTED)  SEDIMENTATION RATE  C-REACTIVE PROTEIN  TROPONIN I (HIGH SENSITIVITY)    EKG EKG Interpretation  Date/Time:  Sunday March 05 2023 16:09:03 EDT Ventricular Rate:  108 PR Interval:  179 QRS Duration: 85 QT Interval:  309 QTC Calculation: 415 R Axis:   2 Text Interpretation: Sinus tachycardia Low voltage, precordial leads Abnormal  R-wave progression, early transition Confirmed by Kristine Royal [phone]) on 03/05/2023 4:10:55 PM  Radiology DG Chest Port 1 View  Result Date: 03/05/2023 CLINICAL DATA:  sob EXAM: PORTABLE CHEST 1 VIEW COMPARISON:  January 24, 2023 FINDINGS: The cardiomediastinal silhouette is grossly unchanged and enlarged in contour.RIGHT upper extremity PICC tip terminates over the superior RIGHT atrium. No pleural effusion. No pneumothorax. Increased lateral opacities within the LEFT lung. Persistent background of coarse reticulation, similar in comparison to priors. IMPRESSION: Increased opacities within the lateral LEFT lung. Differential considerations include infection, infarction atelectasis. Electronically Signed   By: Meda Klinefelter M.D.   On: 03/05/2023 17:27    Procedures Procedures    Medications Ordered in ED Medications  sodium chloride 0.9 % bolus 500 mL (has no administration in time range)  ceFEPIme (MAXIPIME) 2 g in sodium chloride 0.9 % 100 mL IVPB (has no administration in time range)  hydrocortisone sodium succinate (SOLU-CORTEF) 100 MG injection 100 mg (has no administration in time range)  sodium chloride 0.9 % bolus 1,000 mL (has no administration in time range)    ED Course/ Medical Decision Making/ A&P                             Medical Decision Making Amount and/or Complexity of Data Reviewed Labs: ordered. Radiology: ordered.  Risk OTC drugs. Prescription drug management. Decision regarding hospitalization.    Medical Screen Complete  This  patient presented to the ED with complaint of patient is presenting with weakness, mild hypoxia, mild hypotension.  This complaint involves an extensive number of treatment options. The initial differential diagnosis includes, but is not limited to, infection, adrenal insufficiency, metabolic abnormality, PE, ACS, etc.  This presentation is: Acute, Chronic, Self-Limited, Previously Undiagnosed, Uncertain Prognosis, Complicated, Systemic Symptoms, and Threat to Life/Bodily Function  Patient with complex recent medical history presenting with worsening weakness x 24 hours.  Patient is unable to ambulate today.  Patient is noted to be both mildly hypoxic with room air sat of 90% and also mildly hypotensive on arrival.  Patient is on longstanding IV antibiotics for the last 3 weeks.  Case briefly discussed with Dr. Daiva Eves with ID.  ID is in agreement with plan to continue daptomycin and to broaden coverage slightly with dose of cefepime here versus the patient's normal dose of Rocephin.  Initial chest x-ray is concerning for possible infiltrates in the left lung field.  Patient's hypotension and hypoxia are improved with supplemental O2 and gentle hydration.  Patient with longstanding history of daily prednisone use.  Stress dose steroids administered on arrival.  CT imaging of the brain is without acute pathology.  CT imaging of the chest reveals evidence of PE and possible concurrent multifocal pneumonia.  Heparin anticoagulation initiated in the ED.  Hospital service made aware of case and will evaluate for admission.  Patient and patient's family understand indications for admission.    Additional history obtained:  External records from outside sources obtained and reviewed including prior ED visits and prior Inpatient records.    Lab Tests:  I ordered and personally interpreted labs.  The pertinent results include: CBC, CMP, COVID, flu, i-STAT Chem-8, lactic acid, troponin, sed  rate, CRP   Imaging Studies ordered:  I ordered imaging studies including chest x-ray, CT head, CT chest I independently visualized and interpreted obtained imaging which showed possible infiltrates on chest x-ray, pulmonary embolism on CT with possible concurrent pneumonia I agree with  the radiologist interpretation.   Cardiac Monitoring:  The patient was maintained on a cardiac monitor.  I personally viewed and interpreted the cardiac monitor which showed an underlying rhythm of: NSR   Medicines ordered:  I ordered medication including antibiotics, heparin, IV fluids for suspected infection, pulmonary embolus, hypotension Reevaluation of the patient after these medicines showed that the patient: improved   Problem List / ED Course:  Hypotension, hypoxia, pulmonary embolus, weakness   Reevaluation:  After the interventions noted above, I reevaluated the patient and found that they have: improved   Disposition:  After consideration of the diagnostic results and the patients response to treatment, I feel that the patent would benefit from admission.   CRITICAL CARE Performed by: Wynetta Fines   Total critical care time: 30 minutes  Critical care time was exclusive of separately billable procedures and treating other patients.  Critical care was necessary to treat or prevent imminent or life-threatening deterioration.  Critical care was time spent personally by me on the following activities: development of treatment plan with patient and/or surrogate as well as nursing, discussions with consultants, evaluation of patient's response to treatment, examination of patient, obtaining history from patient or surrogate, ordering and performing treatments and interventions, ordering and review of laboratory studies, ordering and review of radiographic studies, pulse oximetry and re-evaluation of patient's condition.          Final Clinical Impression(s) / ED  Diagnoses Final diagnoses:  Other acute pulmonary embolism, unspecified whether acute cor pulmonale present    Rx / DC Orders ED Discharge Orders     None         Wynetta Fines, MD 03/05/23 2350

## 2023-03-05 NOTE — ED Notes (Signed)
Pt transported to CT ?

## 2023-03-05 NOTE — ED Notes (Signed)
Pt brought back from CT after trying to crawl off of CT table saying he has to pee

## 2023-03-05 NOTE — ED Notes (Signed)
1st set of blood cultures collected by EDT at this time

## 2023-03-05 NOTE — Progress Notes (Signed)
Pharmacy Antibiotic Note  [name] is a 89 y.o. male admitted on 03/05/2023 with concern for pna.  Pharmacy has been consulted for cefepime dosing.  Plan: Cefepime 2g IV every 12 hours Monitor renal function, clinical progression and ability to narrow back to ceftriaxone from PTA  Height: 6' (182.9 cm) Weight: 78.7 kg (173 lb 8 oz) IBW/kg (Calculated) : 77.6  Temp (24hrs), Avg:99.1 F (37.3 C), Min:98.3 F (36.8 C), Max:99.8 F (37.7 C)  Recent Labs  Lab 03/05/23 1614 03/05/23 1622  WBC 20.0*  --   CREATININE 1.17 1.10  LATICACIDVEN 2.6*  --     Estimated Creatinine Clearance: 49 mL/min (by C-G formula based on SCr of 1.1 mg/dL).    Allergies  Allergen Reactions   Amoxicillin Other (See Comments)   Codeine Nausea And Vomiting   Doxycycline Other (See Comments)    Upset stomach, currently taking and tolerating well     Morphine And Related Nausea And Vomiting    Daylene Posey, PharmD, [facility] Clinical Pharmacist ED Pharmacist Phone # [phone] 03/05/2023 9:39 PM

## 2023-03-05 NOTE — Progress Notes (Signed)
ANTICOAGULATION CONSULT NOTE - Initial Consult  Pharmacy Consult for heparin Indication: pulmonary embolus  Allergies  Allergen Reactions   Amoxicillin Other (See Comments)   Codeine Nausea And Vomiting   Doxycycline Other (See Comments)    Upset stomach, currently taking and tolerating well     Morphine And Related Nausea And Vomiting    Patient Measurements: Height: 6' (182.9 cm) Weight: 78.7 kg (173 lb 8 oz) IBW/kg (Calculated) : 77.6 Heparin Dosing Weight: TBW  Vital Signs: Temp: 99.8 F (37.7 C) (04/07 1645) Temp Source: Oral (04/07 1645) BP: 112/66 (04/07 1830) Pulse Rate: 85 (04/07 1830)  Labs: Recent Labs    03/05/23 1614 03/05/23 1622  HGB 10.2* 10.5*  HCT 31.4* 31.0*  PLT 320  --   CREATININE 1.17 1.10  TROPONINIHS 36*  --     Estimated Creatinine Clearance: 49 mL/min (by C-G formula based on SCr of 1.1 mg/dL).   Medical History: Past Medical History:  Diagnosis Date   AAA (abdominal aortic aneurysm)    Abnormal EKG    Left atrial abnormality   Allergic rhinitis    Allergy    Benign neoplasm of colon 04/14/10   3 small polyps on colonoscopy by Dr. Marina Goodell   Carpal tunnel syndrome    Cataract    Degenerative disc disease    Disturbances metabolism of methionine, homocystine, and cystathionine    Elevated homocysteine   Elevated prostate specific antigen (PSA)    Hearing loss    Hyperlipidemia    Impotence of organic origin    Penile implant   Internal hemorrhoids    Neck pain    Otosclerosis of both ears    Peripheral vascular disease    Bilateral femoral bruit   Plantar fasciitis    Polymyalgia rheumatica    Rotator cuff syndrome of left shoulder    Scoliosis    Shoulder pain     Assessment: 89 YOM presenting with weakness, CT angio chest shows PE with RHS, he is not on anticoagulation PTA  Goal of Therapy:  Heparin level 0.3-0.7 units/ml Monitor platelets by anticoagulation protocol: Yes   Plan:  Heparin 4000 units IV x 1, and  gtt at 1250 units/hr F/u 8 hour heparin level F/u long term [facility] plan  Daylene Posey, PharmD, [facility] Clinical Pharmacist ED Pharmacist Phone # [phone] 03/05/2023 8:00 PM

## 2023-03-05 NOTE — ED Notes (Signed)
This NT did a bladder scan on pt, pt bladder was holding of urine. NT and Nurse helped pt stand to urinate, pt voided 2mL.

## 2023-03-05 NOTE — ED Notes (Signed)
ABX delayed d/t family questioning starting another abx on top of the ones he is currently on. Notified Messick MD

## 2023-03-05 NOTE — ED Triage Notes (Signed)
Pt arrived to ED via EMS from home w/ c/o CODE SEPSIS. Pt w/ increased generalized weakness, urinary frequency and difficulty urinating per EMS. Pt on abx for unknown organism via PICC line present on admission. Pt slid out of his bed and not complaining of pain. CBG 164. T 97.5, HR 115, BP 96/70. 91% RA so EMS placed pt on 2L 95% RA. EMS gave NS bolus. 4mg  zofran. 18g L FA

## 2023-03-05 NOTE — ED Notes (Signed)
ED TO INPATIENT HANDOFF REPORT  ED Nurse Name and Phone #: Minerva Areola [phone]  R Name/Age/Gender [name] 89 y.o. male Room/Bed: 029C/029C  Code Status   Code Status: Full Code  Home/SNF/Other Home Patient oriented to:  Is this baseline? Yes   Triage Complete: Triage complete  Chief Complaint Acute pulmonary embolism [I26.99]  Triage Note Pt arrived to ED via EMS from home w/ c/o CODE SEPSIS. Pt w/ increased generalized weakness, urinary frequency and difficulty urinating per EMS. Pt on abx for unknown organism via PICC line present on admission. Pt slid out of his bed and not complaining of pain. CBG 164. T 97.5, HR 115, BP 96/70. 91% RA so EMS placed pt on 2L 95% RA. EMS gave NS bolus. 4mg  zofran. 18g L FA   Allergies Allergies  Allergen Reactions   Amoxicillin Other (See Comments)   Codeine Nausea And Vomiting   Doxycycline Other (See Comments)    Upset stomach, currently taking and tolerating well     Morphine And Related Nausea And Vomiting    Level of Care/Admitting Diagnosis ED Disposition     ED Disposition  Admit   Condition  --   Comment  Hospital Area: [facility] [[id number]]  Level of Care: Progressive [102]  Admit to Progressive based on following criteria: MULTISYSTEM THREATS such as stable sepsis, metabolic/electrolyte imbalance with or without encephalopathy that is responding to early treatment.  May place patient in observation at [facility] or [facility] if equivalent level of care is available:: No  Covid Evaluation: Asymptomatic - no recent exposure (last 10 days) testing not required  Diagnosis: Acute pulmonary embolism [[id number]]  Admitting Physician: Angie Fava [[id number]]  Attending Physician: Angie Fava [[id number]]          B Medical/Surgery History Past Medical History:  Diagnosis Date   AAA (abdominal aortic aneurysm)    Abnormal EKG    Left atrial abnormality   Allergic rhinitis    Allergy     Benign neoplasm of colon 04/14/10   3 small polyps on colonoscopy by Dr. Marina Goodell   Carpal tunnel syndrome    Cataract    Degenerative disc disease    Disturbances metabolism of methionine, homocystine, and cystathionine    Elevated homocysteine   Elevated prostate specific antigen (PSA)    Hearing loss    Hyperlipidemia    Impotence of organic origin    Penile implant   Internal hemorrhoids    Neck pain    Otosclerosis of both ears    Peripheral vascular disease    Bilateral femoral bruit   Plantar fasciitis    Polymyalgia rheumatica    Rotator cuff syndrome of left shoulder    Scoliosis    Shoulder pain    Past Surgical History:  Procedure Laterality Date   ABDOMINAL AORTIC ENDOVASCULAR STENT GRAFT N/A 08/16/2017   Procedure: ABDOMINAL AORTIC ENDOVASCULAR STENT GRAFT insertion;  Surgeon: Nada Libman, MD;  Location: [facility] OR;  Service: Vascular;  Laterality: N/A;   Actinic keratosis removal  06/21/2010   Left shoulder; Dr. 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   Procedure: DUPUYTREN CONTRACTURE RELEASE RIGHT LONG, RING AND SMALL FINGERS;  Surgeon: Wyn Forster., MD;  Location: [facility];  Service: Orthopedics;  Laterality: Right;   Implant penile pump  2001   IR ANGIOGRAM PELVIS SELECTIVE OR SUPRASELECTIVE  12/11/2020  IR ANGIOGRAM SELECTIVE EACH ADDITIONAL VESSEL  12/11/2020   IR EMBO ARTERIAL NOT HEMORR HEMANG INC GUIDE ROADMAPPING  12/11/2020   IR RADIOLOGIST EVAL & MGMT  07/30/2020   IR US GUIDE VASC ACCESS RIGHT  12/11/2020   Resection of appendix and tip of rectum  February 2005   STAPEDECTOMY Bilateral 1985, 1988   [employer]     A IV Location/Drains/Wounds Patient Lines/Drains/Airways Status     Active Line/Drains/Airways     Name Placement date Placement time Site Days   Peripheral IV 03/05/23 18 G Anterior;Left;Proximal Forearm 03/05/23  1609  Forearm  less than 1    Peripheral IV 03/05/23 18 G Anterior;Left;Upper Forearm 03/05/23  1639  Forearm  less than 1   PICC Single Lumen Right Basilic --  --  Basilic  --            Intake/Output Last 24 hours  Intake/Output Summary (Last 24 hours) at 03/05/2023 2035 Last data filed at 03/05/2023 1923 Gross per 24 hour  Intake 1900 ml  Output --  Net 1900 ml    Labs/Imaging Results for orders placed or performed during the hospital encounter of 03/05/23 (from the past 48 hour(s))  CBC with Differential     Status: Abnormal   Collection Time: 03/05/23  4:14 PM  Result Value Ref Range   WBC 20.0 (H) 4.0 - 10.5 K/uL   RBC 3.27 (L) 4.22 - 5.81 MIL/uL   Hemoglobin 10.2 (L) 13.0 - 17.0 g/dL   HCT 40.9 (L) 81.1 - 91.4 %   MCV 96.0 80.0 - 100.0 fL   MCH 31.2 26.0 - 34.0 pg   MCHC 32.5 30.0 - 36.0 g/dL   RDW 78.2 (H) 95.6 - 21.3 %   Platelets 320 150 - 400 K/uL   nRBC 0.0 0.0 - 0.2 %   Neutrophils Relative % 89 %   Neutro Abs 17.8 (H) 1.7 - 7.7 K/uL   Lymphocytes Relative 3 %   Lymphs Abs 0.6 (L) 0.7 - 4.0 K/uL   Monocytes Relative 5 %   Monocytes Absolute 1.0 0.1 - 1.0 K/uL   Eosinophils Relative 1 %   Eosinophils Absolute 0.2 0.0 - 0.5 K/uL   Basophils Relative 1 %   Basophils Absolute 0.2 (H) 0.0 - 0.1 K/uL   Immature Granulocytes 1 %   Abs Immature Granulocytes 0.20 (H) 0.00 - 0.07 K/uL    Comment: Performed at [facility], [address], [place], [place] [address]  Troponin I (High Sensitivity)     Status: Abnormal   Collection Time: 03/05/23  4:14 PM  Result Value Ref Range   Troponin I (High Sensitivity) 36 (H) <18 ng/L    Comment: (NOTE) Elevated high sensitivity troponin I (hsTnI) values and significant  changes across serial measurements may suggest ACS but many other  chronic and acute conditions are known to elevate hsTnI results.  Refer to the "Links" section for chest pain algorithms and additional  guidance. Performed at [facility], [address] [place],  [place] [address]   Lactic acid, plasma     Status: Abnormal   Collection Time: 03/05/23  4:14 PM  Result Value Ref Range   Lactic Acid, Venous 2.6 (HH) 0.5 - 1.9 mmol/L    Comment: CRITICAL RESULT CALLED TO, READ BACK BY AND VERIFIED WITH E,DIXON RN @1714  03/05/23 E,BENTON Performed at [facility], [address], [place], [place] [address]   Comprehensive metabolic panel  Status: Abnormal   Collection Time: 03/05/23  4:14 PM  Result Value Ref Range   Sodium 131 (L) 135 - 145 mmol/L   Potassium 4.1 3.5 - 5.1 mmol/L   Chloride 99 98 - 111 mmol/L   CO2 22 22 - 32 mmol/L   Glucose, Bld 131 (H) 70 - 99 mg/dL    Comment: Glucose reference range applies only to samples taken after fasting for at least 8 hours.   BUN 15 8 - 23 mg/dL   Creatinine, Ser 4.09 0.61 - 1.24 mg/dL   Calcium 7.8 (L) 8.9 - 10.3 mg/dL   Total Protein 6.0 (L) 6.5 - 8.1 g/dL   Albumin 2.2 (L) 3.5 - 5.0 g/dL   AST 32 15 - 41 U/L   ALT 31 0 - 44 U/L   Alkaline Phosphatase 75 38 - 126 U/L   Total Bilirubin 0.5 0.3 - 1.2 mg/dL   GFR, Estimated 59 (L) >60 mL/min    Comment: (NOTE) Calculated using the CKD-EPI Creatinine Equation (2021)    Anion gap 10 5 - 15    Comment: Performed at [facility], [address], [place], [place] [address]  Sedimentation rate     Status: Abnormal   Collection Time: 03/05/23  4:14 PM  Result Value Ref Range   Sed Rate 105 (H) 0 - 16 mm/hr    Comment: Performed at [facility], [address] [place], [place] [address]  C-reactive protein     Status: Abnormal   Collection Time: 03/05/23  4:14 PM  Result Value Ref Range   CRP 21.8 (H) <1.0 mg/dL    Comment: Performed at [facility], [address] [place], [place] [address]  I-stat chem 8, ED     Status: Abnormal   Collection Time: 03/05/23  4:22 PM  Result Value Ref Range   Sodium 131 (L) 135 - 145 mmol/L   Potassium 4.1 3.5 - 5.1 mmol/L   Chloride 98 98 - 111 mmol/L   BUN 15 8 - 23 mg/dL   Creatinine, Ser  0.86 0.61 - 1.24 mg/dL   Glucose, Bld 578 (H) 70 - 99 mg/dL    Comment: Glucose reference range applies only to samples taken after fasting for at least 8 hours.   Calcium, Ion 1.08 (L) 1.15 - 1.40 mmol/L   TCO2 24 22 - 32 mmol/L   Hemoglobin 10.5 (L) 13.0 - 17.0 g/dL   HCT 46.9 (L) 62.9 - 52.8 %  Resp panel by RT-PCR (RSV, Flu A&B, Covid) Anterior Nasal Swab     Status: None   Collection Time: 03/05/23  4:29 PM   Specimen: Anterior Nasal Swab  Result Value Ref Range   SARS Coronavirus 2 by RT PCR NEGATIVE NEGATIVE   Influenza A by PCR NEGATIVE NEGATIVE   Influenza B by PCR NEGATIVE NEGATIVE    Comment: (NOTE) The Xpert Xpress SARS-CoV-2/FLU/RSV plus assay is intended as an aid in the diagnosis of influenza from Nasopharyngeal swab specimens and should not be used as a sole basis for treatment. Nasal washings and aspirates are unacceptable for Xpert Xpress SARS-CoV-2/FLU/RSV testing.  Fact Sheet for Patients: BloggerCourse.com  Fact Sheet for Healthcare Providers: SeriousBroker.it  This test is not yet approved or cleared by the [place] FDA and has been authorized for detection and/or diagnosis of SARS-CoV-2 by FDA under an Emergency Use Authorization (EUA). This EUA will remain in effect (meaning this test can be used) for the duration of  the COVID-19 declaration under Section 564(b)(1) of the Act, 21 U.S.C. section 360bbb-3(b)(1), unless the authorization is terminated or revoked.     Resp Syncytial Virus by PCR NEGATIVE NEGATIVE    Comment: (NOTE) Fact Sheet for Patients: BloggerCourse.com  Fact Sheet for Healthcare Providers: SeriousBroker.it  This test is not yet approved or cleared by the [place] FDA and has been authorized for detection and/or diagnosis of SARS-CoV-2 by FDA under an Emergency Use Authorization (EUA). This EUA will remain in effect  (meaning this test can be used) for the duration of the COVID-19 declaration under Section 564(b)(1) of the Act, 21 U.S.C. section 360bbb-3(b)(1), unless the authorization is terminated or revoked.  Performed at [facility], [address] [place], [place] [address]   CBG monitoring, ED     Status: Abnormal   Collection Time: 03/05/23  4:30 PM  Result Value Ref Range   Glucose-Capillary 136 (H) 70 - 99 mg/dL    Comment: Glucose reference range applies only to samples taken after fasting for at least 8 hours.  Urinalysis, w/ Reflex to Culture (Infection Suspected) -Urine, Clean Catch     Status: Abnormal   Collection Time: 03/05/23  6:45 PM  Result Value Ref Range   Specimen Source URINE, CLEAN CATCH    Color, Urine AMBER (A) YELLOW    Comment: BIOCHEMICALS MAY BE AFFECTED BY COLOR   APPearance CLEAR CLEAR   Specific Gravity, Urine 1.017 1.005 - 1.030   pH 6.0 5.0 - 8.0   Glucose, UA NEGATIVE NEGATIVE mg/dL   Hgb urine dipstick SMALL (A) NEGATIVE   Bilirubin Urine NEGATIVE NEGATIVE   Ketones, ur NEGATIVE NEGATIVE mg/dL   Protein, ur 604 (A) NEGATIVE mg/dL   Nitrite NEGATIVE NEGATIVE   Leukocytes,Ua NEGATIVE NEGATIVE   RBC / HPF 6-10 0 - 5 RBC/hpf   WBC, UA 0-5 0 - 5 WBC/hpf    Comment:        Reflex urine culture not performed if WBC <=10, OR if Squamous epithelial cells >5. If Squamous epithelial cells >5 suggest recollection.    Bacteria, UA NONE SEEN NONE SEEN   Squamous Epithelial / HPF 0-5 0 - 5 /HPF   Mucus PRESENT     Comment: Performed at [facility], [address], [place], [place] [address]   CT Angio Chest PE W and/or Wo Contrast  Result Date: 03/05/2023 CLINICAL DATA:  Generalized weakness with urinary frequency. Patient on antibiotic for unknown organism via PICC line. EXAM: CT ANGIOGRAPHY CHEST WITH CONTRAST TECHNIQUE: Multidetector CT imaging of the chest was performed using the standard protocol during bolus administration of intravenous  contrast. Multiplanar CT image reconstructions and MIPs were obtained to evaluate the vascular anatomy. RADIATION DOSE REDUCTION: This exam was performed according to the departmental dose-optimization program which includes automated exposure control, adjustment of the mA and/or kV according to patient size and/or use of iterative reconstruction technique. CONTRAST:  75mL OMNIPAQUE IOHEXOL 350 MG/ML SOLN COMPARISON:  Chest radiographs earlier today and CTA chest 12/02/2022 FINDINGS: Cardiovascular: Satisfactory opacification of the pulmonary arteries to the segmental level. Acute pulmonary embolism arising at the distal right main pulmonary artery and extending into the lobar, segmental and subsegmental branches of the right middle and lower lobe pulmonary arteries. Diffuse aneurysmal dilation of the ascending, transverse, and descending thoracic aorta. The mid ascending aorta measures 44 mm in maximum diameter. The proximal descending thoracic aorta measures 51 mm. These are not significantly changed from 12/02/2022. Calcified atherosclerotic plaque.  Prominent PICC tip in the low SVC. Mild cardiomegaly. Coronary artery calcification. Mediastinum/Nodes: Prominent mediastinal and hilar nodes measuring up to 1.2 cm in the left hilum (5/73 are favored reactive. Unremarkable esophagus. Lungs/Pleura: Bilateral patchy ground-glass and consolidative opacities with interlobular septal thickening suggestive of multifocal pneumonia. This is superimposed on a background of peripheral subpleural reticulation and bronchiolectasis compatible with interstitial lung disease. Small bilateral pleural effusions. No pneumothorax. Upper Abdomen: No acute abnormality. Musculoskeletal: No chest wall abnormality. No acute osseous findings. Review of the MIP images confirms the above findings. IMPRESSION: 1. Acute pulmonary embolism arising at the distal right main pulmonary artery and extending into right middle and lower pulmonary  arteries. CT evidence of right heart strain (RV/LV Ratio = 1.14) consistent with at least submassive (intermediate risk) PE. The presence of right heart strain has been associated with an increased risk of morbidity and mortality. Please refer to the "Code PE Focused" order set in EPIC. 2. Pulmonary parenchymal findings compatible with multifocal pneumonia superimposed on a background of interstitial lung disease. 3.  Small bilateral pleural effusions. 4. Unchanged aneurysmal dilation of the ascending, transverse, and descending thoracic aorta with maximum diameter of 51 mm in the proximal descending aorta. Recommend semi-annual imaging followup by CTA or MRA and referral to cardiothoracic surgery if not already obtained. This recommendation follows 2010 ACCF/AHA/AATS/ACR/ASA/SCA/SCAI/SIR/STS/SVM Guidelines for the Diagnosis and Management of Patients With Thoracic Aortic Disease. Circulation. 2010; 121: W295-A21. Aortic aneurysm NOS (ICD10-I71.9) These results were called by telephone at the time of interpretation on 03/05/2023 at 7:58 pm to provider [facility] , who verbally acknowledged these results. Electronically Signed   By: Minerva Fester M.D.   On: 03/05/2023 19:58   CT Head Wo Contrast  Result Date: 03/05/2023 CLINICAL DATA:  Mental status change EXAM: CT HEAD WITHOUT CONTRAST TECHNIQUE: Contiguous axial images were obtained from the base of the skull through the vertex without intravenous contrast. RADIATION DOSE REDUCTION: This exam was performed according to the departmental dose-optimization program which includes automated exposure control, adjustment of the mA and/or kV according to patient size and/or use of iterative reconstruction technique. COMPARISON:  Head CT 12/30/2021 FINDINGS: Brain: No evidence of acute infarction, hemorrhage, hydrocephalus, extra-axial collection or mass lesion/mass effect. There is stable mild periventricular white matter hypodensity, likely chronic small vessel  ischemic change. There is stable mild diffuse atrophy. Vascular: Atherosclerotic calcifications are present within the cavernous internal carotid arteries. Skull: Normal. Negative for fracture or focal lesion. Sinuses/Orbits: No acute finding. Other: None. IMPRESSION: 1. No acute intracranial abnormality. 2. Stable mild chronic small vessel ischemic changes. 3. Stable mild diffuse atrophy. Electronically Signed   By: Darliss Cheney M.D.   On: 03/05/2023 19:39   DG Chest Port 1 View  Result Date: 03/05/2023 CLINICAL DATA:  sob EXAM: PORTABLE CHEST 1 VIEW COMPARISON:  January 24, 2023 FINDINGS: The cardiomediastinal silhouette is grossly unchanged and enlarged in contour.RIGHT upper extremity PICC tip terminates over the superior RIGHT atrium. No pleural effusion. No pneumothorax. Increased lateral opacities within the LEFT lung. Persistent background of coarse reticulation, similar in comparison to priors. IMPRESSION: Increased opacities within the lateral LEFT lung. Differential considerations include infection, infarction atelectasis. Electronically Signed   By: Meda Klinefelter M.D.   On: 03/05/2023 17:27    Pending Labs Unresulted Labs (From admission, onward)     Start     Ordered   03/06/23 0500  Heparin level (unfractionated)  Daily,   R     See Hyperspace for full Linked  Orders Report.   03/05/23 2004   03/06/23 0500  CBC  Daily,   R     See Hyperspace for full Linked Orders Report.   03/05/23 2004   03/06/23 0500  Comprehensive metabolic panel  Tomorrow morning,   R        03/05/23 2027   03/06/23 0500  Magnesium  Tomorrow morning,   R        03/05/23 2027   03/06/23 0500  Phosphorus  Tomorrow morning,   R        03/05/23 2027   03/05/23 2027  Magnesium  Add-on,   AD        03/05/23 2027   03/05/23 1609  Culture, blood (routine x 2)  BLOOD CULTURE X 2,   R (with STAT occurrences)      03/05/23 1609   03/05/23 1609  Lactic acid, plasma  Now then every 2 hours,   R (with STAT  occurrences)      03/05/23 1609            Vitals/Pain Today's Vitals   03/05/23 1800 03/05/23 1815 03/05/23 1830 03/05/23 2033  BP: 108/66 113/65 112/66 113/68  Pulse: 91 87 85 93  Resp: (!) 24 (!) 23 (!) 22 (!) 22  Temp:    98.3 F (36.8 C)  TempSrc:    Oral  SpO2: 100% 100% 100% 98%  Weight:      Height:      PainSc:    0-No pain    Isolation Precautions No active isolations  Medications Medications  DAPTOmycin (CUBICIN) 600 mg in sodium chloride 0.9 % IVPB (0 mg Intravenous Stopped 03/05/23 2018)  sodium chloride flush (NS) 0.9 % injection 10-40 mL (has no administration in time range)  sodium chloride flush (NS) 0.9 % injection 10-40 mL (has no administration in time range)  Chlorhexidine Gluconate Cloth 2 % PADS 6 each (has no administration in time range)  heparin ADULT infusion 100 units/mL (25000 units/260mL) (1,250 Units/hr Intravenous New Bag/Given 03/05/23 2027)  acetaminophen (TYLENOL) tablet 650 mg (has no administration in time range)    Or  acetaminophen (TYLENOL) suppository 650 mg (has no administration in time range)  melatonin tablet 3 mg (has no administration in time range)  ondansetron (ZOFRAN) injection 4 mg (has no administration in time range)  fluorometholone (FML) 0.1 % ophthalmic suspension 1 drop (has no administration in time range)  folic acid (FOLVITE) tablet 1 mg (has no administration in time range)  rosuvastatin (CRESTOR) tablet 5 mg (has no administration in time range)  sodium chloride 0.9 % bolus 500 mL (0 mLs Intravenous Stopped 03/05/23 1922)  ceFEPIme (MAXIPIME) 2 g in sodium chloride 0.9 % 100 mL IVPB (0 g Intravenous Stopped 03/05/23 2018)  hydrocortisone sodium succinate (SOLU-CORTEF) 100 MG injection 100 mg (100 mg Intravenous Given 03/05/23 1754)  sodium chloride 0.9 % bolus 1,000 mL (0 mLs Intravenous Stopped 03/05/23 1923)  iohexol (OMNIPAQUE) 350 MG/ML injection 75 mL (75 mLs Intravenous Contrast Given 03/05/23 1911)  heparin bolus  via infusion 4,000 Units (4,000 Units Intravenous Bolus from Bag 03/05/23 2026)    Mobility walks     Focused Assessments Cardiac Assessment Handoff:  Cardiac Rhythm: Sinus tachycardia Lab Results  Component Value Date   CKTOTAL 153 06/22/2017   TROPONINI <0.03 06/22/2017   No results found for: "DDIMER" Does the Patient currently have chest pain? No    R Recommendations: See Admitting Provider Note  Report given to:   Additional  Notes: pt has piic in rt upper arm 20iv left upper formarm

## 2023-03-05 NOTE — ED Notes (Signed)
Called CT to notify of pt being ready for CT.  

## 2023-03-05 NOTE — ED Notes (Signed)
Tried to call report .. nurse will call me back

## 2023-03-05 NOTE — H&P (Signed)
History and Physical      [name] AQT:[id number] DOB: Sep 12, 1930 DOA: 03/05/2023  PCP: [name], MD  Patient coming from: home   I have personally briefly reviewed patient's old medical records in [facility]  Chief Complaint: Generalized weakness  HPI: [name] is a 89 y.o. male with medical history significant for polymyalgia rheumatica on chronic prednisone therapy, AAA status post endovascular stent graft in September 2018, chronic diastolic heart failure, recent diagnosis of aortitis, chronic leukocytosis, who is admitted to [facility] on 03/05/2023 with acute pulmonary embolism after presenting from home to [facility] ED complaining of generalized weakness.   The patient was [facility] at the end of February 2024 for generalized weakness.  Subsequently, he was admitted to [facility] with similar initial complaint, before being discharged home on 02/13/2023.  During hospitalization at [facility], there was reportedly concern for potential aortitis, for which the patient was started on long-term current spectrum of antibiotics via PICC line, including daptomycin, Rocephin, and Bactrim.  He notes good interval compliance with these antibiotics.  However, over the last 1 to 2 days, he is noted progressive generalized weakness in the absence of any acute focal weakness.  He qualifies his generalized weakness is being significant enough to prevent him from getting out of bed over the last day, which is quite different than his baseline functionality and activity level.  Denies any associated acute focal weakness.   He has a history of polymyalgia rheumatica and has been on chronic steroid therapy for multiple decades.  Recently, there have been outpatient attempts at reducing his dose of chronic prednisone, and he notes that the most recent dose adjustment down to 15 mg of daily prednisone occurred a few days prior to the onset of his presenting generalized  weakness.  He denies any recent subjective fever, chills, rigors, or generalized myalgias.  Denies any new headache, neck stiffness, rash, cough, hemoptysis, shortness of breath, abdominal pain, diarrhea, dysuria or gross hematuria.  He also denies any recent chest pain, palpitations, diaphoresis, nausea, vomiting, dizziness, presyncope, or syncope.  No recent worsening of peripheral edema, or any recent/new lower extremity erythema or calf tenderness.  Denies any known history of prior pulmonary embolism.  Aside from the 2 hospitalizations over the last 6 weeks, denies any additional periods of prolonged diminished ambulatory activity as of recent.  No recent trauma.  No known history of underlying malignancy.  Not on any blood thinners as an outpatient, including no aspirin.  Per chart review, it appears that he also has chronic leukocytosis with chart review revealing most recent prior white blood cell count of 18.8 on 02/28/2023, with other blood cell data points notable for 25.4 on 02/07/2023.  Denies any known baseline supplemental oxygen requirements.     ED Course:  Vital signs in the ED were notable for the following: Temperature max 99.8; heart rate initially in the low 100s, socially decreasing to the 80s following interval IV fluids; initial blood pressure of 94/66, which is increased to 113/68 following interval IV fluids, as further quantified below; respiratory rate 18-25, oxygen saturation initially noted to be 87% on room air, Sosan increasing into the range of 98 to 100% on 2 L nasal cannula.  Labs were notable for the following: CMP notable for the following: Sodium 131 compared to 137 on 02/27/2023, potassium 4.1, bicarbonate 22, creatinine 1.17 compared to 0.98 on 02/27/2023, glucose 131, calcium, adjusted for mild hypoalbuminemia noted to be  9.2, albumin 2.2, liver enzymes within normal limits.  High-sensitivity troponin I initially 36, without any prior high sensitive troponin I did  points available for reported comparison.  ESR 105 compared to most recent prior value of 69 on 01/25/2023, CRP 12.8 compared to 75 on 02/28/2023.  CBC notable for the following: Open cell count 20,000 with 89% neutrophils, hemoglobin 10.26 with normocytic/recurrent properties and relative to most recent prior hemoglobin data point of 11.2 0.4-24, bili 320.  Urinalysis notable for no white blood cells, no bacteria, leukocyte esterase/nitrate negative, small hemoglobin with 6-10 RBCs.  Blood cultures x 2 were collected prior to initiation of IV antibiotics.  COVID, influenza, RSV PCR were all negative.  Per my interpretation, EKG in ED demonstrated the following:, Relative to most recent prior EKG from 01/25/2023 shows sinus tachycardia with heart rate 108, normal intervals, nonspecific T wave inversion in lead III, which appears unchanged from most recent prior EKG, demonstrates no evidence of ST changes, including no evidence of ST elevation.  Imaging and additional notable ED work-up: CT head, performed radiology read, shows no evidence of acute intracranial process, including no evidence of intracranial hemorrhage or any evidence of acute infarct.  CTA chest, relative to CT chest from 12/02/2022, per formal radiology read, shows acute pulmonary embolism at the distal right main pulmonary artery extending into the right middle and lower pulmonary arteries, with CT evidence of right heart strain.  Additionally, CTA chest shows pulmonary parenchymal findings suggestive of multifocal pneumonia superimposed on a background of interstitial lung disease as well as small bilateral pleural effusions.  CTA chest also shows unchanged aneurysmal dilation of the ascending, transverse, and descending thoracic aorta, without any corresponding evidence of dissection.  EDP discussed the patient's case with the on-call infectious disease physician, Dr. Zenaida Niece dam, he felt that acute pneumonia was slightly less likely given the  patient's broad-spectrum IV antibiotics over the last several weeks, including daptomycin, Rocephin, and Bactrim.  He did not recommend changing daptomycin to IV vancomycin, but rather recommended continuation of daptomycin.  He did however recommend consideration for escalation of Rocephin to cefepime, with no additional changes recommended per infectious disease at this time.  While in the ED, the following were administered: Heparin bolus followed by initiation heparin drip.  Hydrocortisone 100 mg IV x 1 dose, cefepime, daptomycin, normal symptoms 1.5 L bolus.  Subsequently, the patient was admitted for further evaluation management of acute pulmonary embolism, complicated by acute hypoxic respiratory distress presenting with generalized weakness, with presentation also notable for potential HCAP pneumonia, and with presenting labs also notable for acute hyponatremia.       Review of Systems: As per HPI otherwise 10 point review of systems negative.   Past Medical History:  Diagnosis Date   AAA (abdominal aortic aneurysm)    Abnormal EKG    Left atrial abnormality   Allergic rhinitis    Allergy    Benign neoplasm of colon 04/14/10   3 small polyps on colonoscopy by Dr. Marina Goodell   Carpal tunnel syndrome    Cataract    Degenerative disc disease    Disturbances metabolism of methionine, homocystine, and cystathionine    Elevated homocysteine   Elevated prostate specific antigen (PSA)    Hearing loss    Hyperlipidemia    Impotence of organic origin    Penile implant   Internal hemorrhoids    Neck pain    Otosclerosis of both ears    Peripheral vascular disease    Bilateral femoral  bruit   Plantar fasciitis    Polymyalgia rheumatica    Rotator cuff syndrome of left shoulder    Scoliosis    Shoulder pain     Past Surgical History:  Procedure Laterality Date   ABDOMINAL AORTIC ENDOVASCULAR STENT GRAFT N/A 08/16/2017   Procedure: ABDOMINAL AORTIC ENDOVASCULAR STENT GRAFT  insertion;  Surgeon: Nada Libman, MD;  Location: [facility] OR;  Service: Vascular;  Laterality: N/A;   Actinic keratosis removal  06/21/2010   Left shoulder; Dr. 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   Procedure: DUPUYTREN CONTRACTURE RELEASE RIGHT LONG, RING AND SMALL FINGERS;  Surgeon: Wyn Forster., MD;  Location: [facility];  Service: Orthopedics;  Laterality: Right;   Implant penile pump  2001   IR ANGIOGRAM PELVIS SELECTIVE OR SUPRASELECTIVE  12/11/2020   IR ANGIOGRAM SELECTIVE EACH ADDITIONAL VESSEL  12/11/2020   IR EMBO ARTERIAL NOT HEMORR HEMANG INC GUIDE ROADMAPPING  12/11/2020   IR RADIOLOGIST EVAL & MGMT  07/30/2020   IR US GUIDE VASC ACCESS RIGHT  12/11/2020   Resection of appendix and tip of rectum  February 2005   STAPEDECTOMY Bilateral 1985, 1988   [employer]    Social History:  reports that he has never smoked. He has never used smokeless tobacco. He reports that he does not drink alcohol and does not use drugs.   Allergies  Allergen Reactions   Amoxicillin Other (See Comments)   Codeine Nausea And Vomiting   Doxycycline Other (See Comments)    Upset stomach, currently taking and tolerating well     Morphine And Related Nausea And Vomiting    Family History  Problem Relation Age of Onset   Heart disease Mother    Emphysema Mother    Colon cancer Neg Hx    Esophageal cancer Neg Hx    Rectal cancer Neg Hx    Stomach cancer Neg Hx     Family history reviewed and not pertinent    Prior to Admission medications   Medication Sig Start Date End Date Taking? Authorizing Provider  acyclovir (ZOVIRAX) 400 MG tablet ONE TABLET TWICE A DAY DUE TO HERPES INFECTION Patient taking differently: Take 400 mg by mouth 2 (two) times daily. 01/22/18  Yes Sharon Seller, NP  butalbital-acetaminophen-caffeine (FIORICET) 50-325-40 MG tablet Take 1 tablet by mouth daily as needed for  headache.   Yes [provider]  cefTRIAXone (ROCEPHIN) IVPB Inject 2 g into the vein daily.   Yes [provider]  cholecalciferol (VITAMIN D3) 25 MCG (1000 UNIT) tablet Take 1,000 Units by mouth daily.   Yes [provider]  daptomycin (CUBICIN) IVPB Inject 600 mg into the vein daily.   Yes [provider]  DOCOSAHEXAENOIC ACID PO Take 1 g by mouth daily.   Yes [provider]  fish oil-omega-3 fatty acids 1000 MG capsule Take 1,000 mg by mouth daily.    Yes [provider]  fluorometholone (FML) 0.1 % ophthalmic suspension Place 1 drop into the right eye at bedtime.    Yes [provider]  folic acid (FOLVITE) 1 MG tablet Take 1 mg by mouth daily.   Yes [provider]  L-Methylfolate-B12-B6-B2 (METAFOLBIC) 04-28-49-5 MG TABS TAKE ONE TABLET TWICE DAILY Patient taking differently: Take 1 tablet by mouth 2 (two) times daily. 08/18/17  Yes Sharon Seller, NP  Multiple Vitamins-Minerals (MULTIVITAMIN PO) Take 1 tablet by  mouth daily.    Yes [provider]  pantoprazole (PROTONIX) 40 MG tablet Take 40 mg by mouth daily.   Yes [provider]  predniSONE (DELTASONE) 5 MG tablet Take 20 mg by mouth daily with breakfast.   Yes [provider]  sulfamethoxazole-trimethoprim (BACTRIM DS) 800-160 MG tablet Take 1 tablet by mouth 3 (three) times a week. Monday, Wednesday, Friday   Yes [provider]  tamsulosin (FLOMAX) 0.4 MG CAPS capsule Take 0.8 mg by mouth at bedtime. 06/10/20  Yes [provider]  Tart Cherry 1200 MG CAPS Take 1 tablet by mouth daily.   Yes [provider]  traZODone (DESYREL) 50 MG tablet Take 25 mg by mouth at bedtime as needed for sleep.   Yes [provider]  predniSONE (DELTASONE) 5 MG tablet Take 4 tablets (20 mg total) by mouth daily with breakfast for 5 days, THEN 3 tablets (15 mg total) daily with breakfast for 5 days, THEN 2 tablets (10 mg  total) daily with breakfast for 5 days, THEN 1 tablet (5 mg total) daily with breakfast. Take 4 tablets by mouth in the morning and then take 1 tablet by mouth at night. Patient not taking: Reported on 03/05/2023 01/25/23 03/11/23  Hollice EspyKrishnan, Sendil K, MD  rosuvastatin (CRESTOR) 5 MG tablet TAKE ONE TABLET EACH DAY TO REDUCE CHOLESTEROL Patient not taking: Reported on 03/05/2023 09/01/17   Sharon SellerEubanks, Jessica K, NP     Objective    Physical Exam: Vitals:   03/05/23 1800 03/05/23 1815 03/05/23 1830 03/05/23 2033  BP: 108/66 113/65 112/66 113/68  Pulse: 91 87 85 93  Resp: (!) 24 (!) 23 (!) 22 (!) 22  Temp:    98.3 F (36.8 C)  TempSrc:    Oral  SpO2: 100% 100% 100% 98%  Weight:      Height:        General: appears to be stated age; alert, oriented Skin: warm, dry, no rash Head:  AT/[place] Mouth:  Oral mucosa membranes appear dry, normal dentition Neck: supple; trachea midline Heart:  RRR; did not appreciate any M/R/G Lungs: CTAB, did not appreciate any wheezes, rales, or rhonchi Abdomen: + BS; soft, ND, NT Vascular: 2+ pedal pulses b/l; 2+ radial pulses b/l Extremities: no peripheral edema, no muscle wasting Neuro: strength and sensation intact in upper and lower extremities b/l     Labs on Admission: I have personally reviewed following labs and imaging studies  CBC: Recent Labs  Lab 03/05/23 1614 03/05/23 1622  WBC 20.0*  --   NEUTROABS 17.8*  --   HGB 10.2* 10.5*  HCT 31.4* 31.0*  MCV 96.0  --   PLT 320  --    Basic Metabolic Panel: Recent Labs  Lab 03/05/23 1614 03/05/23 1622  NA 131* 131*  K 4.1 4.1  CL 99 98  CO2 22  --   GLUCOSE 131* 130*  BUN 15 15  CREATININE 1.17 1.10  CALCIUM 7.8*  --    GFR: Estimated Creatinine Clearance: 49 mL/min (by C-G formula based on SCr of 1.1 mg/dL). Liver Function Tests: Recent Labs  Lab 03/05/23 1614  AST 32  ALT 31  ALKPHOS 75  BILITOT 0.5  PROT 6.0*  ALBUMIN 2.2*   No results for input(s): "LIPASE", "AMYLASE" in  the last 168 hours. No results for input(s): "AMMONIA" in the last 168 hours. Coagulation Profile: No results for input(s): "INR", "PROTIME" in the last 168 hours. Cardiac Enzymes: No results for input(s): "CKTOTAL", "CKMB", "  CKMBINDEX", "TROPONINI" in the last 168 hours. BNP (last 3 results) No results for input(s): "PROBNP" in the last 8760 hours. HbA1C: No results for input(s): "HGBA1C" in the last 72 hours. CBG: Recent Labs  Lab 03/05/23 1630  GLUCAP 136*   Lipid Profile: No results for input(s): "CHOL", "HDL", "LDLCALC", "TRIG", "CHOLHDL", "LDLDIRECT" in the last 72 hours. Thyroid Function Tests: No results for input(s): "TSH", "T4TOTAL", "FREET4", "T3FREE", "THYROIDAB" in the last 72 hours. Anemia Panel: No results for input(s): "VITAMINB12", "FOLATE", "FERRITIN", "TIBC", "IRON", "RETICCTPCT" in the last 72 hours. Urine analysis:    Component Value Date/Time   COLORURINE AMBER (A) 03/05/2023 1845   APPEARANCEUR CLEAR 03/05/2023 1845   LABSPEC 1.017 03/05/2023 1845   PHURINE 6.0 03/05/2023 1845   GLUCOSEU NEGATIVE 03/05/2023 1845   HGBUR SMALL (A) 03/05/2023 1845   BILIRUBINUR NEGATIVE 03/05/2023 1845   KETONESUR NEGATIVE 03/05/2023 1845   PROTEINUR 100 (A) 03/05/2023 1845   NITRITE NEGATIVE 03/05/2023 1845   LEUKOCYTESUR NEGATIVE 03/05/2023 1845    Radiological Exams on Admission: CT Angio Chest PE W and/or Wo Contrast  Result Date: 03/05/2023 CLINICAL DATA:  Generalized weakness with urinary frequency. Patient on antibiotic for unknown organism via PICC line. EXAM: CT ANGIOGRAPHY CHEST WITH CONTRAST TECHNIQUE: Multidetector CT imaging of the chest was performed using the standard protocol during bolus administration of intravenous contrast. Multiplanar CT image reconstructions and MIPs were obtained to evaluate the vascular anatomy. RADIATION DOSE REDUCTION: This exam was performed according to the departmental dose-optimization program which includes automated exposure  control, adjustment of the mA and/or kV according to patient size and/or use of iterative reconstruction technique. CONTRAST:  75mL OMNIPAQUE IOHEXOL 350 MG/ML SOLN COMPARISON:  Chest radiographs earlier today and CTA chest 12/02/2022 FINDINGS: Cardiovascular: Satisfactory opacification of the pulmonary arteries to the segmental level. Acute pulmonary embolism arising at the distal right main pulmonary artery and extending into the lobar, segmental and subsegmental branches of the right middle and lower lobe pulmonary arteries. Diffuse aneurysmal dilation of the ascending, transverse, and descending thoracic aorta. The mid ascending aorta measures 44 mm in maximum diameter. The proximal descending thoracic aorta measures 51 mm. These are not significantly changed from 12/02/2022. Calcified atherosclerotic plaque. Prominent PICC tip in the low SVC. Mild cardiomegaly. Coronary artery calcification. Mediastinum/Nodes: Prominent mediastinal and hilar nodes measuring up to 1.2 cm in the left hilum (5/73 are favored reactive. Unremarkable esophagus. Lungs/Pleura: Bilateral patchy ground-glass and consolidative opacities with interlobular septal thickening suggestive of multifocal pneumonia. This is superimposed on a background of peripheral subpleural reticulation and bronchiolectasis compatible with interstitial lung disease. Small bilateral pleural effusions. No pneumothorax. Upper Abdomen: No acute abnormality. Musculoskeletal: No chest wall abnormality. No acute osseous findings. Review of the MIP images confirms the above findings. IMPRESSION: 1. Acute pulmonary embolism arising at the distal right main pulmonary artery and extending into right middle and lower pulmonary arteries. CT evidence of right heart strain (RV/LV Ratio = 1.14) consistent with at least submassive (intermediate risk) PE. The presence of right heart strain has been associated with an increased risk of morbidity and mortality. Please refer to the  "Code PE Focused" order set in EPIC. 2. Pulmonary parenchymal findings compatible with multifocal pneumonia superimposed on a background of interstitial lung disease. 3.  Small bilateral pleural effusions. 4. Unchanged aneurysmal dilation of the ascending, transverse, and descending thoracic aorta with maximum diameter of 51 mm in the proximal descending aorta. Recommend semi-annual imaging followup by CTA or MRA and referral to cardiothoracic surgery  if not already obtained. This recommendation follows 2010 ACCF/AHA/AATS/ACR/ASA/SCA/SCAI/SIR/STS/SVM Guidelines for the Diagnosis and Management of Patients With Thoracic Aortic Disease. Circulation. 2010; 121: J191-Y78. Aortic aneurysm NOS (ICD10-I71.9) These results were called by telephone at the time of interpretation on 03/05/2023 at 7:58 pm to provider [facility] , who verbally acknowledged these results. Electronically Signed   By: Minerva Fester M.D.   On: 03/05/2023 19:58   CT Head Wo Contrast  Result Date: 03/05/2023 CLINICAL DATA:  Mental status change EXAM: CT HEAD WITHOUT CONTRAST TECHNIQUE: Contiguous axial images were obtained from the base of the skull through the vertex without intravenous contrast. RADIATION DOSE REDUCTION: This exam was performed according to the departmental dose-optimization program which includes automated exposure control, adjustment of the mA and/or kV according to patient size and/or use of iterative reconstruction technique. COMPARISON:  Head CT 12/30/2021 FINDINGS: Brain: No evidence of acute infarction, hemorrhage, hydrocephalus, extra-axial collection or mass lesion/mass effect. There is stable mild periventricular white matter hypodensity, likely chronic small vessel ischemic change. There is stable mild diffuse atrophy. Vascular: Atherosclerotic calcifications are present within the cavernous internal carotid arteries. Skull: Normal. Negative for fracture or focal lesion. Sinuses/Orbits: No acute finding. Other:  None. IMPRESSION: 1. No acute intracranial abnormality. 2. Stable mild chronic small vessel ischemic changes. 3. Stable mild diffuse atrophy. Electronically Signed   By: Darliss Cheney M.D.   On: 03/05/2023 19:39   DG Chest Port 1 View  Result Date: 03/05/2023 CLINICAL DATA:  sob EXAM: PORTABLE CHEST 1 VIEW COMPARISON:  January 24, 2023 FINDINGS: The cardiomediastinal silhouette is grossly unchanged and enlarged in contour.RIGHT upper extremity PICC tip terminates over the superior RIGHT atrium. No pleural effusion. No pneumothorax. Increased lateral opacities within the LEFT lung. Persistent background of coarse reticulation, similar in comparison to priors. IMPRESSION: Increased opacities within the lateral LEFT lung. Differential considerations include infection, infarction atelectasis. Electronically Signed   By: Meda Klinefelter M.D.   On: 03/05/2023 17:27      Assessment/Plan   Principal Problem:   Acute pulmonary embolism Active Problems:   Generalized weakness   PMR (polymyalgia rheumatica)   Hyperlipidemia   AAA (abdominal aortic aneurysm) without rupture (HCC)   Acute hypoxic respiratory failure   Acute hyponatremia   Elevated troponin   Lactic acidosis   HCAP (healthcare-associated pneumonia)   Chronic diastolic CHF (congestive heart failure)   BPH (benign prostatic hyperplasia)     #) Acute Pulmonary Embolism: In the context of presenting generalized weakness, CTA chest demonstrates evidence of an acute PE right-sided pulmonary embolism along with CT evidence suggestive of right heart strain.  Presentation associated with mild hypoxia, with oxygen saturation is improving into the high 90s on 2 L nasal cannula.  No overtly associated hypotension to warrant consideration for tPA administration, noting most recent blood pressure of 113/68.   Appears to be patient's first PE/DVT.  Potential provoking factors include multiple periods of recent diminished ambulatory activity in  the form of 2 hospitalizations over the course the last 6 weeks.  Overall, will require at least 3-6 months of anticoagulation. Not on any blood-thinners at home, including no aspirin. Continue Heparin drip initiated in the ED for now.    mildly elevated troponin appears consistent with finding of acute PE, and ACS is felt to be less likely at this time.  Presentation is not consistent with any chest pain, and presenting EKG demonstrates no evidence of acute ischemic changes, including no evidence of ST elevation.    Plan:  Continue heparin drip. Monitor on telemetry. Monitor for development of hypotension. Prn supplemental O2 in order to maintain oxygen saturations greater than or equal to 92%. Incentive spirometry.  Continuous pulse ox.  Recheck CBC in the morning.  Echocardiogram ordered for the morning to further evaluate for evidence of right heart strain.  Continuous lactated Ringer's at 75 cc/h x 10 hours for hemodynamic support given increased risk for preload dependent pathophysiology.             #) Generalized weakness: 1 to 2-day duration of generalized weakness, in the absence of any evidence of acute focal neurologic deficits, including no evidence of acute focal weakness to suggest acute CVA, also noting presenting CT head shows no evidence of acute intracranial process.  This is potentially multifactorial etiology, with potential for contribution from acute pulmonary embolism associated with evidence of right heart strain.  Additional potential injury factors include recent decrease in dose of the patient's long-term prednisone therapy.  Also, differential for the patient's presenting generalized weakness also includes the possibility of HCAP pneumonia, as further discussed below.  Otherwise, no overt evidence of acute underlying infectious process, including urinalysis that was inconsistent with UTI, will COVID, influenza, RSV PCR were all negative.  Additionally, in the setting of  outpatient statin use as well as daptomycin, will also add on CPK level.  Potential additional secondary contribution from acute hyponatremia, as further detailed below.  Plan: work-up and management of presenting acute right-sided pulmonary embolism, as above, as described above. PT/OT consults ordered for the AM. Fall precautions. CMP/CBC in the AM. Check TSH, serum Mg level. Check CPK level.  Further evaluation of potential HCAP pneumonia, as further detailed below.              #) Acute hypoxic respiratory distress: in the context of no known baseline supplemental O2 requirements, presenting O2 sat noted to be 87% on room air, Sosan improving into the high 90s on 2 L nasal cannula, thereby meeting criteria for acute hypoxic respiratory distress as opposed to acute hypoxic respiratory failure at this time. Appears to be on basis of presenting acute pulmonary embolism, as further detailed above.  There is also a potential additional contribution from underlying pneumonia, given that CTA chest also showed evidence suggestive of multifocal pneumonia.  Will further evaluate this latter possibility, as further detailed below.    Suspect that mildly elevated troponin is on the basis of supply demand mismatch resulting from the decrease in oxygen delivery capacity that is a/w presenting acute hypoxic respiratory distress as well as from acute pulmonary embolism itself as opposed to representing a type I process due to acute plaque rupture, particularly given presenting ekg that shows no e/o acute ischemic changes, including no evidence of STEMI, and the absence of any reported chest pain a/w this presentation. Overall, ACS is felt to be less likely relative to type 2 supply demand mismatch, as above, but will closely monitor on telemetry overnight while trending troponin and treating suspected underlying acute pulmonary embolism, including continuation of heparin drip.   no clinical or radiographic  evidence to suggest acutely decompensated heart failure at this time.  COVID, influenza  RSV PCR all negative.   Plan: further evaluation/management of presenting acute pulmonary embolism, as above, including continuation of heparin drip. Monitor continuous pulse ox with prn supplemental O2 to maintain O2 sats greater than or equal to 92%. monitor on telemetry. CMP/CBC in the AM. Check serum Mg and Phos levels. Check blood gas. incentive  spirometry.  Add on procalcitonin level, trend troponin.  Echocardiogram in the morning.  Check BNP.           #) Pneumonia: Presenting CTA chest, relative to CT chest from January 2024, performed radiology read, shows pulmonary parenchymal findings that are suggestive of multifocal pneumonia.  Per discussions with infectious disease, pneumonia would be less likely given the patient's recent course of broad-spectrum IV antibiotics, including daptomycin, Rocephin, and Bactrim.  He presents with generalized weakness, he denies any acute respiratory complaints.  Infectious disease conveyed that if there is concern for potential HCAP pneumonia given the patient's multiple recent hospitalizations during which he received IV antibiotics, that daptomycin should be continued and not modified to IV vancomycin, and that consideration can be given to escalating Rocephin to cefepime for addition of antipseudomonal coverage.  No associated recommendations for further expansion of antibiotic coverage to include dedicated atypical coverage at this time.  For now, we will continue daptomycin, expand Rocephin to Cipro cefepime, and engage in further evaluation of potential underlying pneumonia, including pursuit of procalcitonin level.  As there can be some diminished pulmonary penetrance associate with daptomycin, will also check a MRSA PCR level.  Of note, the patient does have a leukocytosis, this appears consistent with the patient's chronically elevated white blood cell count in  the setting of PMR for which she is on chronic prednisone therapy, and presenting will blood cell count is actually lower than value of 25.4 noted on 02/07/2023.  Additionally, in the absence of objective fever, it does not appear that SIRS criteria are met for sepsis at this time.  Aside from potential multifocal pneumonia, no additional evidence of underlying acute new infectious process identified at this time, including UA that was not suggestive of UTI, will COVID, influenza, and RSV PCR were all negative.  Of note, blood cultures x 2 were collected prior to receiving next dose of outpatient daptomycin as well as single dose of cefepime.   Plan: Follow-up results of blood cultures x 2.  Per ID recommendations, continue daptomycin.  Hold Rocephin in favor of cefepime.  Continue outpatient Bactrim.  Continuous IV fluids, as above.  Repeat lactic acid level.  Add on procalcitonin level.  Check MRSA PCR.  Check strep pneumonia urine antigen.  Check mycoplasma antibodies.  Says spirometry.  Prn acetaminophen for fever.            #) Acute hypo-osmolar  hyponatremia: Presenting serum sodium level of 131 compared to most recent prior value 137 on 02/27/2023. Suspect an element of hypovolumeia, with suspected contribution from dehydration in the setting of clinical evidence of such as well as report of recent decline in oral intake coinciding with the timeframe of decline of serum sodium levels, will also noting improvement in mild tachycardia with IV fluids.  Differential also includes the possibility of a contribution from SIADH, particularly given multiple acute pulmonary findings, including acute pulmonary embolism as well as CTA findings that are suggestive of multifocal pneumonia, as further detailed above. in general, will provide gentle IV fluids to attend to suspected contribution from dehydration, while further evaluating for any additional contributing factors, including SIADH, as further  detailed below. Will also check TSH.  Of note, no evidence of associated acute focal neurologic deficits and no report of recent trauma.    Plan: monitor strict I's and O's and daily weights.  check random urine sodium, urine osmolality.  Check serum osmolality to confirm suspected hypoosmolar etiology.  Repeat CMP in the morning.  Check TSH. Gentle IVF's in the form of lactated Ringer's at 75 cc/h x 10 hours. Check serum uric acid level, as SIADH can be associated with hypouricemia due to hyperuricuria.  Further evaluation management presenting acute pulmonary embolism as well as potential multifocal pneumonia, as further detailed above.            #) lactic acidosis: Initial lactate noted to be 2.6.  Suspect contribution from generalized relative tissue hypoxia in the context of presenting acute hypoxic respiratory distress as a consequence of presenting acute pulmonary embolism.  As noted above, SIRS criteria not met at this time for sepsis.  There may also be a contribution from relative dehydration, and slight interval decline in renal function, although not to the extent where quantitative threshold is met for AKI.  In the context of recent use of daptomycin and presenting generalized weakness, will also check CPK level.  Plan: Continuous lactated Ringer's, as above, with repeat lactic acid level currently pending.  Check CPK level.  Further evaluation management presenting acute hypoxic respiratory distress in setting of acute pulmonary embolism, including upper EXTR, as above.  Add on procalcitonin level.              #) History of aortitis: Appears to have a recent diagnosis of this stemming from March 2024 hospitalization at [facility], on long-term daptomycin, Rocephin, and Bactrim following PICC line placement at [facility].  As described above, per EDP's discussions with infectious disease today, will continue daptomycin.  Additionally, per the discussions, including concern for  potential HCAP pneumonia, will hold him Rocephin for now in deference to escalation to cefepime and inclusion of antipseudomonal coverage.  Plan: Continue daptomycin, as above.  Hold Rocephin, and start cefepime for now, as above.  Add on procalcitonin level.  Continue home Bactrim.            #) Aortic aneurysm: In the context of a history of AAA status post endovascular stent graft repair in September 2018, today CTA chest, relative demonstration prior CTA from 12/02/2022, shows unchanged aneurysmal dilation of ascending, transverse, and descending thoracic aorta, without any evidence of dissection.  Corresponding radiology recommendations for every 6 month monitoring via either CTA or MRI.  Plan: Per formal radiology recommendation, will recommend every 6 month monitoring via CTA or MRA, as above.            #) Polymyalgia rheumatica: On chronic prednisone therapy dating back over 20 years, with recent de-escalation and dose down to prednisone 15 mg p.o. daily, as further detailed above.  Due to initial soft blood pressure, the patient received a dose of stress dose hydrocortisone via EDP in the ED this evening.  Plan: Resume home prednisone 15 mg p.o. daily.                 #) Chronic diastolic heart failure: documented history of such, with most recent echocardiogram performed in July 2023, which is notable for LVEF 60 to 65%, no focal motion maladies, grade 1 diastolic dysfunction with normal right ventricular systolic function, no significant valvular pathology. No clinical or radiographic evidence to suggest acutely decompensated heart failure at this time. home diuretic regimen reportedly consists of the following: None.    Plan: monitor strict I's & O's and daily weights. Repeat CMP in AM. Check serum mag level.  Add on BNP.  Follow-up result of echocardiogram, which has been ordered for further evaluation presenting acute pulmonary  embolism.             #)  Hyperlipidemia: documented h/o such. On rosuvastatin as outpatient.   Plan: continue home statin.  Follow result of CPK level, as above.             #) Benign Prostatic Hyperplasia:  documented h/o such; on tamsulosin as outpatient.  In the setting of soft initial blood pressure, will hold home tamsulosin dose this evening.  Plan: monitor strict I's & O's and daily weights. Repeat CMP in AM.  Hold home tamsulosin for now, as above.      DVT prophylaxis: Heparin drip Code Status: Full code Family Communication: none Disposition Plan: Per Rounding Team Consults called: EDP discussed patient's case with on-call infectious disease, Dr. Zenaida Niece dam, as further detailed above;  Admission status: obs     I SPENT GREATER THAN 75  MINUTES IN CLINICAL CARE TIME/MEDICAL DECISION-MAKING IN COMPLETING THIS ADMISSION.      Chaney Born Philo Kurtz DO [facility]  From 7PM - 7AM   03/05/2023, 9:45 PM

## 2023-03-06 ENCOUNTER — Observation Stay (HOSPITAL_COMMUNITY): Payer: Medicare Other

## 2023-03-06 ENCOUNTER — Other Ambulatory Visit (HOSPITAL_COMMUNITY): Payer: Self-pay

## 2023-03-06 DIAGNOSIS — I824Z1 Acute embolism and thrombosis of unspecified deep veins of right distal lower extremity: Secondary | ICD-10-CM | POA: Diagnosis not present

## 2023-03-06 DIAGNOSIS — E871 Hypo-osmolality and hyponatremia: Secondary | ICD-10-CM | POA: Diagnosis not present

## 2023-03-06 DIAGNOSIS — R410 Disorientation, unspecified: Secondary | ICD-10-CM | POA: Diagnosis not present

## 2023-03-06 DIAGNOSIS — I4892 Unspecified atrial flutter: Secondary | ICD-10-CM

## 2023-03-06 DIAGNOSIS — G9341 Metabolic encephalopathy: Secondary | ICD-10-CM | POA: Diagnosis present

## 2023-03-06 DIAGNOSIS — E8809 Other disorders of plasma-protein metabolism, not elsewhere classified: Secondary | ICD-10-CM | POA: Diagnosis present

## 2023-03-06 DIAGNOSIS — J9601 Acute respiratory failure with hypoxia: Secondary | ICD-10-CM | POA: Diagnosis present

## 2023-03-06 DIAGNOSIS — R0600 Dyspnea, unspecified: Secondary | ICD-10-CM | POA: Diagnosis not present

## 2023-03-06 DIAGNOSIS — I483 Typical atrial flutter: Secondary | ICD-10-CM

## 2023-03-06 DIAGNOSIS — I480 Paroxysmal atrial fibrillation: Secondary | ICD-10-CM | POA: Diagnosis present

## 2023-03-06 DIAGNOSIS — Z7901 Long term (current) use of anticoagulants: Secondary | ICD-10-CM | POA: Diagnosis not present

## 2023-03-06 DIAGNOSIS — J8281 Chronic eosinophilic pneumonia: Secondary | ICD-10-CM

## 2023-03-06 DIAGNOSIS — F19982 Other psychoactive substance use, unspecified with psychoactive substance-induced sleep disorder: Secondary | ICD-10-CM | POA: Diagnosis not present

## 2023-03-06 DIAGNOSIS — Z1152 Encounter for screening for COVID-19: Secondary | ICD-10-CM | POA: Diagnosis not present

## 2023-03-06 DIAGNOSIS — J189 Pneumonia, unspecified organism: Secondary | ICD-10-CM | POA: Diagnosis not present

## 2023-03-06 DIAGNOSIS — R0902 Hypoxemia: Secondary | ICD-10-CM | POA: Diagnosis not present

## 2023-03-06 DIAGNOSIS — J849 Interstitial pulmonary disease, unspecified: Secondary | ICD-10-CM | POA: Diagnosis present

## 2023-03-06 DIAGNOSIS — I2609 Other pulmonary embolism with acute cor pulmonale: Secondary | ICD-10-CM

## 2023-03-06 DIAGNOSIS — B370 Candidal stomatitis: Secondary | ICD-10-CM | POA: Diagnosis present

## 2023-03-06 DIAGNOSIS — I776 Arteritis, unspecified: Secondary | ICD-10-CM | POA: Diagnosis not present

## 2023-03-06 DIAGNOSIS — I4891 Unspecified atrial fibrillation: Secondary | ICD-10-CM | POA: Diagnosis not present

## 2023-03-06 DIAGNOSIS — I2699 Other pulmonary embolism without acute cor pulmonale: Secondary | ICD-10-CM | POA: Diagnosis present

## 2023-03-06 DIAGNOSIS — R4182 Altered mental status, unspecified: Secondary | ICD-10-CM | POA: Diagnosis not present

## 2023-03-06 DIAGNOSIS — R531 Weakness: Secondary | ICD-10-CM | POA: Diagnosis not present

## 2023-03-06 DIAGNOSIS — R509 Fever, unspecified: Secondary | ICD-10-CM

## 2023-03-06 DIAGNOSIS — I484 Atypical atrial flutter: Secondary | ICD-10-CM | POA: Diagnosis not present

## 2023-03-06 DIAGNOSIS — T380X5D Adverse effect of glucocorticoids and synthetic analogues, subsequent encounter: Secondary | ICD-10-CM | POA: Diagnosis not present

## 2023-03-06 DIAGNOSIS — L89151 Pressure ulcer of sacral region, stage 1: Secondary | ICD-10-CM | POA: Diagnosis not present

## 2023-03-06 DIAGNOSIS — F419 Anxiety disorder, unspecified: Secondary | ICD-10-CM | POA: Diagnosis not present

## 2023-03-06 DIAGNOSIS — J9690 Respiratory failure, unspecified, unspecified whether with hypoxia or hypercapnia: Secondary | ICD-10-CM | POA: Diagnosis not present

## 2023-03-06 DIAGNOSIS — Z66 Do not resuscitate: Secondary | ICD-10-CM | POA: Diagnosis not present

## 2023-03-06 DIAGNOSIS — I2489 Other forms of acute ischemic heart disease: Secondary | ICD-10-CM | POA: Diagnosis present

## 2023-03-06 DIAGNOSIS — R5381 Other malaise: Secondary | ICD-10-CM | POA: Diagnosis not present

## 2023-03-06 DIAGNOSIS — R7989 Other specified abnormal findings of blood chemistry: Secondary | ICD-10-CM | POA: Diagnosis not present

## 2023-03-06 DIAGNOSIS — Z95828 Presence of other vascular implants and grafts: Secondary | ICD-10-CM | POA: Diagnosis not present

## 2023-03-06 DIAGNOSIS — I5032 Chronic diastolic (congestive) heart failure: Secondary | ICD-10-CM | POA: Diagnosis present

## 2023-03-06 DIAGNOSIS — I471 Supraventricular tachycardia, unspecified: Secondary | ICD-10-CM | POA: Diagnosis not present

## 2023-03-06 DIAGNOSIS — G4709 Other insomnia: Secondary | ICD-10-CM | POA: Diagnosis not present

## 2023-03-06 DIAGNOSIS — E785 Hyperlipidemia, unspecified: Secondary | ICD-10-CM | POA: Diagnosis present

## 2023-03-06 DIAGNOSIS — F05 Delirium due to known physiological condition: Secondary | ICD-10-CM | POA: Diagnosis not present

## 2023-03-06 DIAGNOSIS — E222 Syndrome of inappropriate secretion of antidiuretic hormone: Secondary | ICD-10-CM | POA: Diagnosis present

## 2023-03-06 DIAGNOSIS — D638 Anemia in other chronic diseases classified elsewhere: Secondary | ICD-10-CM | POA: Diagnosis present

## 2023-03-06 DIAGNOSIS — E872 Acidosis, unspecified: Secondary | ICD-10-CM | POA: Diagnosis present

## 2023-03-06 DIAGNOSIS — I714 Abdominal aortic aneurysm, without rupture, unspecified: Secondary | ICD-10-CM | POA: Diagnosis not present

## 2023-03-06 DIAGNOSIS — Z7189 Other specified counseling: Secondary | ICD-10-CM | POA: Diagnosis not present

## 2023-03-06 DIAGNOSIS — Y95 Nosocomial condition: Secondary | ICD-10-CM | POA: Diagnosis present

## 2023-03-06 DIAGNOSIS — M353 Polymyalgia rheumatica: Secondary | ICD-10-CM | POA: Diagnosis present

## 2023-03-06 DIAGNOSIS — I82409 Acute embolism and thrombosis of unspecified deep veins of unspecified lower extremity: Secondary | ICD-10-CM | POA: Diagnosis not present

## 2023-03-06 DIAGNOSIS — J8282 Acute eosinophilic pneumonia: Secondary | ICD-10-CM | POA: Diagnosis present

## 2023-03-06 DIAGNOSIS — N400 Enlarged prostate without lower urinary tract symptoms: Secondary | ICD-10-CM | POA: Diagnosis not present

## 2023-03-06 DIAGNOSIS — D84821 Immunodeficiency due to drugs: Secondary | ICD-10-CM | POA: Diagnosis present

## 2023-03-06 DIAGNOSIS — Z7401 Bed confinement status: Secondary | ICD-10-CM | POA: Diagnosis not present

## 2023-03-06 DIAGNOSIS — I739 Peripheral vascular disease, unspecified: Secondary | ICD-10-CM | POA: Diagnosis present

## 2023-03-06 DIAGNOSIS — J80 Acute respiratory distress syndrome: Secondary | ICD-10-CM | POA: Diagnosis not present

## 2023-03-06 DIAGNOSIS — I82451 Acute embolism and thrombosis of right peroneal vein: Secondary | ICD-10-CM | POA: Diagnosis not present

## 2023-03-06 LAB — MRSA NEXT GEN BY PCR, NASAL: MRSA by PCR Next Gen: NOT DETECTED

## 2023-03-06 LAB — COMPREHENSIVE METABOLIC PANEL
ALT: 32 U/L (ref 0–44)
AST: 46 U/L — ABNORMAL HIGH (ref 15–41)
Albumin: 2.2 g/dL — ABNORMAL LOW (ref 3.5–5.0)
Alkaline Phosphatase: 77 U/L (ref 38–126)
Anion gap: 13 (ref 5–15)
BUN: 14 mg/dL (ref 8–23)
CO2: 21 mmol/L — ABNORMAL LOW (ref 22–32)
Calcium: 8 mg/dL — ABNORMAL LOW (ref 8.9–10.3)
Chloride: 100 mmol/L (ref 98–111)
Creatinine, Ser: 1.13 mg/dL (ref 0.61–1.24)
GFR, Estimated: >60 mL/min (ref >60)
Glucose, Bld: 116 mg/dL — ABNORMAL HIGH (ref 70–99)
Potassium: 3.9 mmol/L (ref 3.5–5.1)
Sodium: 134 mmol/L — ABNORMAL LOW (ref 135–145)
Total Bilirubin: 0.5 mg/dL (ref 0.3–1.2)
Total Protein: 6.1 g/dL — ABNORMAL LOW (ref 6.5–8.1)

## 2023-03-06 LAB — BLOOD GAS, VENOUS
Acid-base deficit: 1.5 mmol/L (ref 0.0–2.0)
Bicarbonate: 22.9 mmol/L (ref 20.0–28.0)
O2 Saturation: 43.4 %
Patient temperature: 37
pCO2, Ven: 37 mmHg — ABNORMAL LOW (ref 44–60)
pH, Ven: 7.4 (ref 7.25–7.43)
pO2, Ven: <31 mmHg — CL (ref 32–45)

## 2023-03-06 LAB — LACTATE DEHYDROGENASE: LDH: 286 U/L — ABNORMAL HIGH (ref 98–192)

## 2023-03-06 LAB — ECHOCARDIOGRAM COMPLETE
AR max vel: 2.21 cm2
AV Area VTI: 2.25 cm2
AV Area mean vel: 2.14 cm2
AV Mean grad: 10 mmHg
AV Peak grad: 16.8 mmHg
Ao pk vel: 2.05 m/s
Area-P 1/2: 3.24 cm2
Height: 72 in
S' Lateral: 2.6 cm
Weight: 2719.95 oz

## 2023-03-06 LAB — CBC
HCT: 33.6 % — ABNORMAL LOW (ref 39.0–52.0)
Hemoglobin: 10.8 g/dL — ABNORMAL LOW (ref 13.0–17.0)
MCH: 31.1 pg (ref 26.0–34.0)
MCHC: 32.1 g/dL (ref 30.0–36.0)
MCV: 96.8 fL (ref 80.0–100.0)
Platelets: 346 10*3/uL (ref 150–400)
RBC: 3.47 MIL/uL — ABNORMAL LOW (ref 4.22–5.81)
RDW: 16.1 % — ABNORMAL HIGH (ref 11.5–15.5)
WBC: 22 10*3/uL — ABNORMAL HIGH (ref 4.0–10.5)
nRBC: 0 % (ref 0.0–0.2)

## 2023-03-06 LAB — SEDIMENTATION RATE: Sed Rate: 138 mm/hr — ABNORMAL HIGH (ref 0–16)

## 2023-03-06 LAB — FERRITIN: Ferritin: 201 ng/mL (ref 24–336)

## 2023-03-06 LAB — HEPARIN LEVEL (UNFRACTIONATED)
Heparin Unfractionated: 0.14 IU/mL — ABNORMAL LOW (ref 0.30–0.70)
Heparin Unfractionated: 0.15 IU/mL — ABNORMAL LOW (ref 0.30–0.70)

## 2023-03-06 LAB — MAGNESIUM: Magnesium: 2 mg/dL (ref 1.7–2.4)

## 2023-03-06 LAB — TROPONIN I (HIGH SENSITIVITY): Troponin I (High Sensitivity): 45 ng/L — ABNORMAL HIGH (ref <18)

## 2023-03-06 LAB — URIC ACID: Uric Acid, Serum: 3.9 mg/dL (ref 3.7–8.6)

## 2023-03-06 LAB — C-REACTIVE PROTEIN: CRP: 29 mg/dL — ABNORMAL HIGH (ref <1.0)

## 2023-03-06 LAB — PROCALCITONIN: Procalcitonin: 0.44 ng/mL

## 2023-03-06 LAB — LACTIC ACID, PLASMA
Lactic Acid, Venous: 2.4 mmol/L (ref 0.5–1.9)
Lactic Acid, Venous: 1.6 mmol/L (ref 0.5–1.9)

## 2023-03-06 LAB — GLUCOSE, CAPILLARY: Glucose-Capillary: 171 mg/dL — ABNORMAL HIGH (ref 70–99)

## 2023-03-06 LAB — CK: Total CK: 204 U/L (ref 49–397)

## 2023-03-06 LAB — CULTURE, BLOOD (ROUTINE X 2)

## 2023-03-06 LAB — PHOSPHORUS: Phosphorus: 3 mg/dL (ref 2.5–4.6)

## 2023-03-06 MED ORDER — ENOXAPARIN SODIUM 80 MG/0.8ML IJ SOSY
80.0000 mg | PREFILLED_SYRINGE | Freq: Two times a day (BID) | INTRAMUSCULAR | Status: DC
  Administered 2023-03-07 – 2023-03-10 (×7): 80 mg via SUBCUTANEOUS
  Filled 2023-03-06 (×7): qty 0.8

## 2023-03-06 MED ORDER — HEPARIN (PORCINE) 25000 UT/250ML-% IV SOLN
1500.0000 [IU]/h | INTRAVENOUS | Status: DC
  Administered 2023-03-06: 1500 [IU]/h via INTRAVENOUS
  Filled 2023-03-06: qty 250

## 2023-03-06 MED ORDER — ENOXAPARIN SODIUM 80 MG/0.8ML IJ SOSY
80.0000 mg | PREFILLED_SYRINGE | Freq: Once | INTRAMUSCULAR | Status: AC
  Administered 2023-03-06: 80 mg via SUBCUTANEOUS
  Filled 2023-03-06: qty 0.8

## 2023-03-06 MED ORDER — HEPARIN BOLUS VIA INFUSION
2000.0000 [IU] | Freq: Once | INTRAVENOUS | Status: AC
  Administered 2023-03-06: 2000 [IU] via INTRAVENOUS
  Filled 2023-03-06: qty 2000

## 2023-03-06 MED ORDER — AMIODARONE IV BOLUS ONLY 150 MG/100ML
150.0000 mg | Freq: Once | INTRAVENOUS | Status: AC
  Administered 2023-03-06: 150 mg via INTRAVENOUS
  Filled 2023-03-06: qty 100

## 2023-03-06 MED ORDER — METOPROLOL TARTRATE 25 MG PO TABS
25.0000 mg | ORAL_TABLET | Freq: Two times a day (BID) | ORAL | Status: DC
  Filled 2023-03-06: qty 1

## 2023-03-06 MED ORDER — LIDOCAINE HCL (PF) 2% IJ FOR NEBU: 2.5000 mL | RESPIRATORY_TRACT | Status: DC | PRN

## 2023-03-06 MED ORDER — ADENOSINE 6 MG/2ML IV SOLN
INTRAVENOUS | Status: AC
  Filled 2023-03-06: qty 2

## 2023-03-06 MED ORDER — VANCOMYCIN HCL 1250 MG/250ML IV SOLN
1250.0000 mg | INTRAVENOUS | Status: DC
  Administered 2023-03-06 – 2023-03-10 (×5): 1250 mg via INTRAVENOUS
  Filled 2023-03-06 (×6): qty 250

## 2023-03-06 MED ORDER — AMIODARONE HCL IN DEXTROSE 360-4.14 MG/200ML-% IV SOLN
30.0000 mg/h | INTRAVENOUS | Status: DC
  Administered 2023-03-06 – 2023-03-07 (×3): 30 mg/h via INTRAVENOUS
  Filled 2023-03-06 (×4): qty 200

## 2023-03-06 MED ORDER — CHLORHEXIDINE GLUCONATE CLOTH 2 % EX PADS: 6.0000 | MEDICATED_PAD | Freq: Every day | CUTANEOUS | Status: DC

## 2023-03-06 MED ORDER — HYDROXYZINE HCL 25 MG PO TABS
25.0000 mg | ORAL_TABLET | Freq: Three times a day (TID) | ORAL | Status: DC | PRN
  Administered 2023-03-07: 25 mg via ORAL
  Filled 2023-03-06: qty 1

## 2023-03-06 MED ORDER — PANTOPRAZOLE SODIUM 40 MG PO TBEC
40.0000 mg | DELAYED_RELEASE_TABLET | Freq: Every day | ORAL | Status: DC
  Administered 2023-03-06 – 2023-03-13 (×8): 40 mg via ORAL
  Filled 2023-03-06 (×8): qty 1

## 2023-03-06 MED ORDER — AMIODARONE HCL IN DEXTROSE 360-4.14 MG/200ML-% IV SOLN
60.0000 mg/h | INTRAVENOUS | Status: DC
  Administered 2023-03-06 (×2): 60 mg/h via INTRAVENOUS
  Filled 2023-03-06: qty 200

## 2023-03-06 MED ORDER — VANCOMYCIN HCL 1500 MG/300ML IV SOLN: 1500.0000 mg | Freq: Once | INTRAVENOUS | Status: DC

## 2023-03-06 MED ORDER — METHYLPREDNISOLONE SODIUM SUCC 125 MG IJ SOLR
125.0000 mg | Freq: Four times a day (QID) | INTRAMUSCULAR | Status: DC
  Administered 2023-03-06 – 2023-03-09 (×10): 125 mg via INTRAVENOUS
  Filled 2023-03-06 (×10): qty 2

## 2023-03-06 MED ORDER — METOPROLOL TARTRATE 5 MG/5ML IV SOLN
5.0000 mg | Freq: Once | INTRAVENOUS | Status: AC | PRN
  Administered 2023-03-06: 5 mg via INTRAVENOUS
  Filled 2023-03-06: qty 5

## 2023-03-06 NOTE — Consult Note (Signed)
NAME:  [name], MRN:  [id number], DOB:  Apr 17, 1930, LOS: 0 ADMISSION DATE:  03/05/2023, CONSULTATION DATE: 4/8  REFERRING MD:  Daiva EvesVan Dam CHIEF COMPLAINT:  Generalized Weakness   History of Present Illness:  89 yr old male with significant pmhx of polymyalgia rheumatica on chronic steroids, recent hospitalization for aortitis, CHF, chronic leukocytosis, AAA with S/P stent graft in 2018, HLD, and DDD was [facility] with [facility] and initially complaining of generalized weakness on 4/7. Patient developed SVT on the morning of 4/8 along with respiratory distress and hypoxia requiring non-re breather mask. [facility], and Infectious Disease. PCCM consulted by ID to assist in managing the acute hypoxic respiratory failure.   Pertinent  Medical History   Past Medical History:  Diagnosis Date   AAA (abdominal aortic aneurysm)    Abnormal EKG    Left atrial abnormality   Allergic rhinitis    Allergy    Benign neoplasm of colon 04/14/10   3 small polyps on colonoscopy by Dr. Marina GoodellPerry   Carpal tunnel syndrome    Cataract    Degenerative disc disease    Disturbances metabolism of methionine, homocystine, and cystathionine    Elevated homocysteine   Elevated prostate specific antigen (PSA)    Hearing loss    Hyperlipidemia    Impotence of organic origin    Penile implant   Internal hemorrhoids    Neck pain    Otosclerosis of both ears    Peripheral vascular disease    Bilateral femoral bruit   Plantar fasciitis    Polymyalgia rheumatica    Rotator cuff syndrome of left shoulder    Scoliosis    Shoulder pain      Significant Hospital Events: Including procedures, antibiotic start and stop dates in addition to other pertinent events   4/7 Admitted for Acute PE 4/8 Developed Rapid Atrial Flutter-HR 170s-180s , along with respiratory failure requiring NRB, PCCM consulted   Interim History / Subjective:    Objective   Blood pressure 120/67,  pulse 83, temperature 98.7 F (37.1 C), temperature source Oral, resp. rate 19, height 6' (1.829 m), weight 77.1 kg, SpO2 90 %.        Intake/Output Summary (Last 24 hours) at 03/06/2023 1557 Last data filed at 03/06/2023 1507 Gross per 24 hour  Intake 2299.93 ml  Output 300 ml  Net 1999.93 ml   Filed Weights   03/05/23 1642 03/05/23 2200 03/06/23 0624  Weight: 78.7 kg 77.1 kg 77.1 kg    Examination: General: chronically ill appearing older male, lying on hospital bed  HENT: Normocephalic, EOM Intact MM Pink Moist. [place] in Nares  Lungs: Crackles Right lower lung field, clear and diminished left lung fields, tachypnea, no respiratory distress   Cardiovascular: s1, s2 auscultated, RRR, no m/r/g  Abdomen: BS active, soft  Extremities: move all extremities with ease, no edema  Neuro: Alert and oriented x4, anxious, follows commands  GU: intact   Resolved Hospital Problem list   N/a   Assessment & Plan:  Acute Hypoxic Respiratory Failure in setting possibly of multifocal pna vs eosinophilic pna complicated by Acute PE  Hx of PMR on chronic prednisone, immunocompromised  Hx of Chronic Diastolic Failure  4/8 developed Atrial flutter and respiratory distress with increasing oxygenation needs CTA scan 4/7: right sided PE with heart strain  4/8 ECHO Pending  Was on long-term daptomycin, rocephin, and Bactrim via PICC for aortitis started approximately at 3/14 at  [facility]  P: Cardiology and ID following  Daptomycin discontinued, continue abx per ID recommendations  Monitor pulse ox continuously, O2 Sat goal >90%, titrate oxygen as needed, utilize HFNC  Repeat Chest X-ray am 4/9  Continue prednisone at current dose  Continue lovenox injections  Continue IS and pulmonary hygiene, mobilize as able  No acute interventions for PE at this time  Can consider bronch in future but currently unstable on a respiratory standpoint to perform    Best Practice (right click and "Reselect all SmartList  Selections" daily)   Diet/type: Regular  DVT prophylaxis: systemic dose LMWH GI prophylaxis: PPI Lines: N/A Foley:  N/A Code Status:  full code Last date of multidisciplinary goals of care discussion [patient and wife updated at beside]   Past Medical History:  He,  has a past medical history of AAA (abdominal aortic aneurysm), Abnormal EKG, Allergic rhinitis, Allergy, Benign neoplasm of colon (04/14/10), Carpal tunnel syndrome, Cataract, Degenerative disc disease, Disturbances metabolism of methionine, homocystine, and cystathionine, Elevated prostate specific antigen (PSA), Hearing loss, Hyperlipidemia, Impotence of organic origin, Internal hemorrhoids, Neck pain, Otosclerosis of both ears, Peripheral vascular disease, Plantar fasciitis, Polymyalgia rheumatica, Rotator cuff syndrome of left shoulder, Scoliosis, and Shoulder pain.   Surgical History:   Past Surgical History:  Procedure Laterality Date   ABDOMINAL AORTIC ENDOVASCULAR STENT GRAFT N/A 08/16/2017   Procedure: ABDOMINAL AORTIC ENDOVASCULAR STENT GRAFT insertion;  Surgeon: Nada Libman, MD;  Location: [facility] OR;  Service: Vascular;  Laterality: N/A;   Actinic keratosis removal  06/21/2010   Left shoulder; Dr. 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   Procedure: DUPUYTREN CONTRACTURE RELEASE RIGHT LONG, RING AND SMALL FINGERS;  Surgeon: Wyn Forster., MD;  Location: [facility];  Service: Orthopedics;  Laterality: Right;   Implant penile pump  2001   IR ANGIOGRAM PELVIS SELECTIVE OR SUPRASELECTIVE  12/11/2020   IR ANGIOGRAM SELECTIVE EACH ADDITIONAL VESSEL  12/11/2020   IR EMBO ARTERIAL NOT HEMORR HEMANG INC GUIDE ROADMAPPING  12/11/2020   IR RADIOLOGIST EVAL & MGMT  07/30/2020   IR US GUIDE VASC ACCESS RIGHT  12/11/2020   Resection of appendix and tip of rectum  February 2005   STAPEDECTOMY Bilateral 1985, 1988   [employer]     Social  History:   reports that he has never smoked. He has never used smokeless tobacco. He reports that he does not drink alcohol and does not use drugs.   Family History:  His family history includes Emphysema in his mother; Heart disease in his mother. There is no history of Colon cancer, Esophageal cancer, Rectal cancer, or Stomach cancer.   Allergies Allergies  Allergen Reactions   Amoxicillin Other (See Comments)   Codeine Nausea And Vomiting   Doxycycline Other (See Comments)    Upset stomach, currently taking and tolerating well     Morphine And Related Nausea And Vomiting     Home Medications  Prior to Admission medications   Medication Sig Start Date End Date Taking? Authorizing Provider  acyclovir (ZOVIRAX) 400 MG tablet ONE TABLET TWICE A DAY DUE TO HERPES INFECTION Patient taking differently: Take 400 mg by mouth 2 (two) times daily. 01/22/18  Yes Sharon Seller, NP  butalbital-acetaminophen-caffeine (FIORICET) 50-325-40 MG tablet Take 1 tablet by mouth daily as needed for headache.   Yes [provider]  cefTRIAXone (ROCEPHIN) IVPB Inject 2 g into the vein daily.  Yes [provider]  cholecalciferol (VITAMIN D3) 25 MCG (1000 UNIT) tablet Take 1,000 Units by mouth daily.   Yes [provider]  daptomycin (CUBICIN) IVPB Inject 600 mg into the vein daily.   Yes [provider]  DOCOSAHEXAENOIC ACID PO Take 1 g by mouth daily.   Yes [provider]  fish oil-omega-3 fatty acids 1000 MG capsule Take 1,000 mg by mouth daily.    Yes [provider]  fluorometholone (FML) 0.1 % ophthalmic suspension Place 1 drop into the right eye at bedtime.    Yes [provider]  folic acid (FOLVITE) 1 MG tablet Take 1 mg by mouth daily.   Yes [provider]  L-Methylfolate-B12-B6-B2 (METAFOLBIC) 04-28-49-5 MG TABS TAKE ONE TABLET TWICE DAILY Patient taking differently: Take 1 tablet by mouth 2 (two) times daily. 08/18/17  Yes  Sharon Seller, NP  Multiple Vitamins-Minerals (MULTIVITAMIN PO) Take 1 tablet by mouth daily.    Yes [provider]  pantoprazole (PROTONIX) 40 MG tablet Take 40 mg by mouth daily.   Yes [provider]  predniSONE (DELTASONE) 5 MG tablet Take 20 mg by mouth daily with breakfast.   Yes [provider]  sulfamethoxazole-trimethoprim (BACTRIM DS) 800-160 MG tablet Take 1 tablet by mouth 3 (three) times a week. Monday, Wednesday, Friday   Yes [provider]  tamsulosin (FLOMAX) 0.4 MG CAPS capsule Take 0.8 mg by mouth at bedtime. 06/10/20  Yes [provider]  [place] 1200 MG CAPS Take 1 tablet by mouth daily.   Yes [provider]  traZODone (DESYREL) 50 MG tablet Take 25 mg by mouth at bedtime as needed for sleep.   Yes [provider]  predniSONE (DELTASONE) 5 MG tablet Take 4 tablets (20 mg total) by mouth daily with breakfast for 5 days, THEN 3 tablets (15 mg total) daily with breakfast for 5 days, THEN 2 tablets (10 mg total) daily with breakfast for 5 days, THEN 1 tablet (5 mg total) daily with breakfast. Take 4 tablets by mouth in the morning and then take 1 tablet by mouth at night. Patient not taking: Reported on 03/05/2023 01/25/23 03/11/23  Hollice Espy, MD  rosuvastatin (CRESTOR) 5 MG tablet TAKE ONE TABLET EACH DAY TO REDUCE CHOLESTEROL Patient not taking: Reported on 03/05/2023 09/01/17   Sharon Seller, NP     Signature      Performed by: Rochel Brome BSN, RN, S-ACNP

## 2023-03-06 NOTE — Progress Notes (Signed)
OT Cancellation Note  Patient Details Name: [name] MRN: [id number] DOB: 06-May-1930   Cancelled Treatment:     Medical issues which prohibited Therapy. RN requesting hold from therapy for remainder of day following significant earlier today. OT will f/u as able   Tristan Schroeder 03/06/2023, 11:36 AM

## 2023-03-06 NOTE — Significant Event (Signed)
Rapid Response Event Note   Reason for Call :  HR 160s, nightshift Rapid Response RN already arrived, MD at bedside. Adenosine IV 6mg  then 12mg  given, successful transiently prior to my arrival. HR now 140s-150s.  Assessment : Patient in bed, A&O. Lungs clear/diminished, heart tones fast and irregular. Skin warm to touch.   VS 155/74 (94) HR 153 O2 86% [place] 2L RR 24  Interventions:  Amio bolus 150mg  IV Metoprolol 5mg  IV NRB 15L placed  VS 178/154 HR 93 O2 91% NRB 15L   Plan of Care:  Start PO Metoprolol 25mg  this AM, Cards consult.   Event Summary:  MD Notified: Perrin Maltese MD Call Time: [phone] Arrival Time: 0700  End Time: 4010  Truddie Crumble, RN

## 2023-03-06 NOTE — Progress Notes (Signed)
ANTICOAGULATION CONSULT NOTE -   Pharmacy Consult for heparin>lovenox Indication: pulmonary embolus  Allergies  Allergen Reactions   Amoxicillin Other (See Comments)   Codeine Nausea And Vomiting   Doxycycline Other (See Comments)    Upset stomach, currently taking and tolerating well     Morphine And Related Nausea And Vomiting    Patient Measurements: Height: 6' (182.9 cm) Weight: 77.1 kg (170 lb) IBW/kg (Calculated) : 77.6 Heparin Dosing Weight: TBW  Vital Signs: Temp: 98.9 F (37.2 C) (04/08 0800) Temp Source: Axillary (04/08 0800) BP: 131/60 (04/08 0800) Pulse Rate: 91 (04/08 0800)  Labs: Recent Labs    03/05/23 1614 03/05/23 1622 03/05/23 1942 03/06/23 0500 03/06/23 0633 03/06/23 1422  HGB 10.2* 10.5*  --   --  10.8*  --   HCT 31.4* 31.0*  --   --  33.6*  --   PLT 320  --   --   --  346  --   HEPARINUNFRC  --   --   --  0.14*  --  0.15*  CREATININE 1.17 1.10  --   --  1.13  --   CKTOTAL 106  --   --   --   --   --   TROPONINIHS 36*  --  54*  --  45*  --      Estimated Creatinine Clearance: 47.4 mL/min (by C-G formula based on SCr of 1.13 mg/dL).   Medical History: Past Medical History:  Diagnosis Date   AAA (abdominal aortic aneurysm)    Abnormal EKG    Left atrial abnormality   Allergic rhinitis    Allergy    Benign neoplasm of colon 04/14/10   3 small polyps on colonoscopy by Dr. Marina Goodell   Carpal tunnel syndrome    Cataract    Degenerative disc disease    Disturbances metabolism of methionine, homocystine, and cystathionine    Elevated homocysteine   Elevated prostate specific antigen (PSA)    Hearing loss    Hyperlipidemia    Impotence of organic origin    Penile implant   Internal hemorrhoids    Neck pain    Otosclerosis of both ears    Peripheral vascular disease    Bilateral femoral bruit   Plantar fasciitis    Polymyalgia rheumatica    Rotator cuff syndrome of left shoulder    Scoliosis    Shoulder pain     Assessment: 89  YOM presenting with weakness, CT angio chest shows PE with RHS, he is not on anticoagulation PTA. Pharmacy dosing heparin  -heparin level= 0.15 on 1500 units/hr (after bolus and increase)  Changing to lovenox   Goal of Therapy:  Heparin level 0.3-0.7 units/ml Monitor platelets by anticoagulation protocol: Yes   Plan:  -Lovenox 80mg  sq q12h -CBC every 3 days  Harland German, PharmD Clinical Pharmacist **Pharmacist phone directory can now be found on [email or web address] ([email or web address]).  Listed under [facility].

## 2023-03-06 NOTE — Progress Notes (Signed)
ANTICOAGULATION CONSULT NOTE -   Pharmacy Consult for heparin Indication: pulmonary embolus  Allergies  Allergen Reactions   Amoxicillin Other (See Comments)   Codeine Nausea And Vomiting   Doxycycline Other (See Comments)    Upset stomach, currently taking and tolerating well     Morphine And Related Nausea And Vomiting    Patient Measurements: Height: 6' (182.9 cm) Weight: 77.1 kg (170 lb) IBW/kg (Calculated) : 77.6 Heparin Dosing Weight: TBW  Vital Signs: Temp: 98.1 F (36.7 C) (04/08 0032) Temp Source: Oral (04/07 2200) BP: 113/58 (04/08 0032) Pulse Rate: 150 (04/08 0624)  Labs: Recent Labs    03/05/23 1614 03/05/23 1622 03/05/23 1942 03/06/23 0500 03/06/23 0633  HGB 10.2* 10.5*  --   --  10.8*  HCT 31.4* 31.0*  --   --  33.6*  PLT 320  --   --   --  346  HEPARINUNFRC  --   --   --  0.14*  --   CREATININE 1.17 1.10  --   --   --   CKTOTAL 106  --   --   --   --   TROPONINIHS 36*  --  54*  --   --      Estimated Creatinine Clearance: 48.7 mL/min (by C-G formula based on SCr of 1.1 mg/dL).   Medical History: Past Medical History:  Diagnosis Date   AAA (abdominal aortic aneurysm)    Abnormal EKG    Left atrial abnormality   Allergic rhinitis    Allergy    Benign neoplasm of colon 04/14/10   3 small polyps on colonoscopy by Dr. Marina Goodell   Carpal tunnel syndrome    Cataract    Degenerative disc disease    Disturbances metabolism of methionine, homocystine, and cystathionine    Elevated homocysteine   Elevated prostate specific antigen (PSA)    Hearing loss    Hyperlipidemia    Impotence of organic origin    Penile implant   Internal hemorrhoids    Neck pain    Otosclerosis of both ears    Peripheral vascular disease    Bilateral femoral bruit   Plantar fasciitis    Polymyalgia rheumatica    Rotator cuff syndrome of left shoulder    Scoliosis    Shoulder pain     Assessment: 89 YOM presenting with weakness, CT angio chest shows PE with RHS,  he is not on anticoagulation PTA. Pharmacy dosing heparin  -heparin level= 0.14 on 1250 units/hr   Goal of Therapy:  Heparin level 0.3-0.7 units/ml Monitor platelets by anticoagulation protocol: Yes   Plan:  -Heparin 2000 units IV x 1 then increase infusion to 1500 units/hr -Heparin level in 6 hours and daily wth CBC daily  Harland German, PharmD Clinical Pharmacist **Pharmacist phone directory can now be found on [email or web address] ([email or web address]).  Listed under [facility].

## 2023-03-06 NOTE — Care Management (Addendum)
  Transition of Care [facility]) Screening Note   Patient Details  Name: [name] Date of Birth: 1930-07-07   Transition of Care [facility]) CM/SW Contact:    Gala Lewandowsky, RN Phone Number: 03/06/2023, 3:27 PM    Transition of Care Department [facility]) has reviewed the patient and no TOC needs have been identified at this time. Patient presented for generalized weakness. Patient has insurance and PCP.  TOC will continue to monitor patient advancement through interdisciplinary progression rounds. If new patient transition needs arise, please place a TOC consult.  03-06-23 1628 Patient is active with [facility] for RN/PT services- will need resumption orders.

## 2023-03-06 NOTE — Progress Notes (Signed)
Pharmacy Antibiotic Note  [name] is a 89 y.o. male admitted on 03/05/2023 with infectious vs inflammatory aortitis and pneumonia, ID following. Pharmacy has been consulted for vancomycin and cefepime dosing.  WBC 22 on prednisone, noted chronic leukocytosis. Afebrile. Switching from dapto to vancomycin in case patient has eosinophilic pneumonia from daptomycin.   4/8 Vancomycin 1250mg  Q 24 hr Scr used: 1.13 mg/dL Weight: 80.9 kg Vd coeff: 0.72 L/kg Est AUC: 512  Plan: Vancomycin 1250mg  q24 hr  Cefepime 2g q12 hr Bactrim for PJP prophylaxis  Monitor cultures, clinical status, renal function, vancomycin level Narrow abx as able and f/u duration    Height: 6' (182.9 cm) Weight: 77.1 kg (170 lb) IBW/kg (Calculated) : 77.6  Temp (24hrs), Avg:98.7 F (37.1 C), Min:98.1 F (36.7 C), Max:99.8 F (37.7 C)  Recent Labs  Lab 03/05/23 1614 03/05/23 1622 03/06/23 0633 03/06/23 0820 03/06/23 1057  WBC 20.0*  --  22.0*  --   --   CREATININE 1.17 1.10 1.13  --   --   LATICACIDVEN 2.6*  --   --  2.4* 1.6    Estimated Creatinine Clearance: 47.4 mL/min (by C-G formula based on SCr of 1.13 mg/dL).    Allergies  Allergen Reactions   Amoxicillin Other (See Comments)   Codeine Nausea And Vomiting   Doxycycline Other (See Comments)    Upset stomach, currently taking and tolerating well     Morphine And Related Nausea And Vomiting    Antimicrobials this admission: cefe 4/7 >>  dapto 4/7  Bactrim prophylaxis 4/8 >>  Vancomycin 4/8 >>   Dose adjustments this admission: N/a  Microbiology results: 4/7 BCx: ngtd  4/7 MRSA PCR: pend  Thank you for allowing pharmacy to be a part of this patient's care.  Alphia Moh, PharmD, BCPS, BCCP Clinical Pharmacist  Please check AMION for all [facility] Pharmacy phone numbers After 10:00 PM, call Main Pharmacy [phone]

## 2023-03-06 NOTE — Significant Event (Addendum)
Called by nursing, patient's heart rates in the 160s. SVT.  Patient without chest pains.  Maintaining blood pressure 140-160/60-70. Patient given 6 mg IV adenosine x 1, then 12 mg IV adenosine x 1.  Both without lasting results, and 5mg  IV lopressor.  Amiodarone initiated, 150 bolus and drip.  Patient heart rate slowed.  Currently in the 90s. Patient hypoxic, down to low 80s.  Placed on nonrebreather, currently in the high 90s. Rapid response nurse presents  ABG collected 05/31/36/<31/22.9.  Suspect venous stick despite low CO2.  Likely due to hyperventilation.  Patient shivering and anxious, symptomatic with this tachycardia.  No chest pains.   Cardiology consult placed. APP aware.  Will repeat ABG

## 2023-03-06 NOTE — Progress Notes (Signed)
eLink Physician-Brief Progress Note Patient Name: [name] DOB: [birth year] MRN: [id number]   Date of Service  03/06/2023  HPI/Events of Note  89-year-old male with a history of polymyalgia rheumatica on chronic immunosuppression with prednisone who presented to the hospital in [facility] with suspected aortitis and started on long-term antibiotics he was transferred to  [facility] today found to have new pulmonary embolus with right heart strain on CT chest there is also evidence of bilateral patchy groundglass opacities and interlobular septal thickening and increased reticulation all of which are concerning for concurrent parenchymal ILD.  Autoimmune workup is pending, high-dose steroids were initiated, and pneumocystis workup is underway.  Patient is tachycardic on amiodarone, normotensive, appears in no distress with nonrebreather in place.  Starting high flow nasal cannula now.  Patient has leukocytosis and hypoalbuminemia.  No other significant findings on laboratory studies.  CT chest reviewed -PE and multifocal airspace disease noted..  Echo with appropriate LV function and no significant RV dysfunction  eICU Interventions  Agree with broad-spectrum antibiotics including PJP coverage with Bactrim.  Agree with high-dose steroids.  Agree with treatment dose Lovenox.  No indication for GI prophylaxis.  Will attempt high flow nasal cannula for supportive care.  Multiple family members at bedside, answered questions as necessary.  No acute intervention is indicated at this time.     Intervention Category Evaluation Type: New Patient Evaluation  Desiraye Rolfson 03/06/2023, 8:52 PM

## 2023-03-06 NOTE — Progress Notes (Signed)
PT Cancellation Note  Patient Details Name: [name] MRN: [id number] DOB: Apr 23, 1930   Cancelled Treatment:    Reason Eval/Treat Not Completed: Medical issues which prohibited therapy Pt found to be in SVT this morning with HR in 160s and hypoxic, Rapid response in room. Also not therapeutic on Heparin for acute PE. Will follow for medical readiness.    Blake Divine A Carly Applegate 03/06/2023, 7:29 AM [place], PT, DPT Acute Rehabilitation Services Secure chat preferred Office [phone]

## 2023-03-06 NOTE — Progress Notes (Signed)
RT NOTE: RT attempted to collect ABG sample x 3 without success. MD contacted and notified.

## 2023-03-06 NOTE — TOC Benefit Eligibility Note (Signed)
Patient Product/process development scientist completed.    The patient is currently admitted and upon discharge could be taking Eliquis 5 mg.  The current 30 day co-pay is $84.58.   The patient is currently admitted and upon discharge could be taking Xarelto 20 mg.  The current 30 day co-pay is $81.05   The patient is insured through [facility] Part D   This test claim was processed through [facility]- copay amounts may vary at other pharmacies due to pharmacy/plan contracts, or as the patient moves through the different stages of their insurance plan.  [name], CPHT Pharmacy Patient Advocate Specialist [facility] Patient Advocate Team Direct Number: [phone]  Fax: [phone]

## 2023-03-06 NOTE — Progress Notes (Signed)
Placed patient on heated high flow cannula.   

## 2023-03-06 NOTE — Progress Notes (Signed)
Entered room due to alarming monitor. Heart rate reading 130s sinus tach. Encouraged patient to relax and take some deep breaths and blow out. Provider on call notified and en route to room. Patient attempted to stand against advice of staff. Heart rate increased to 170s/180s and noted to be in SVT at this time. Rapid response called. MD and rapid nurse at bedside. Wife in room and aware

## 2023-03-06 NOTE — Consult Note (Signed)
CARDIOLOGY CONSULT NOTE       Patient ID: [name] MRN: [id number] DOB/AGE: 1930-06-10 89 y.o.  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 Complex recent medical history with PMR on chronic steroids leukocytosis ? Aortitis Sick since March at [facility] and here. PICC line with long term broad spectrum antibiotics Increased weakness ESR elevated 89 CRP 75.  Admitted with sinus tachycardia. History of AAA seen by Brabham post stent graft repair with type 2 endoleak Subsequent IR embolization of L5 lumbar and sacral arteries. No prior cardiac history This am had prolonged flutter arate 150-160 bpm When it terminated had bradycardia with CHB and wide complex escape. Currently in NSR at rate 95 bpm.  No chest pain dyspnea or palpitations Admission complicated by PE distal right main PA extending into right middle and lower PA Underlying multifocal pneumonia with background ILD Currently on iv amiodarone BNP normal Minimal elevation in troponin 45  ROS All other systems reviewed and negative except as noted above  Past Medical History:  Diagnosis Date   AAA (abdominal aortic aneurysm)    Abnormal EKG    Left atrial abnormality   Allergic rhinitis    Allergy    Benign neoplasm of colon 04/14/10   3 small polyps on colonoscopy by Dr. Marina Goodell   Carpal tunnel syndrome    Cataract     Degenerative disc disease    Disturbances metabolism of methionine, homocystine, and cystathionine    Elevated homocysteine   Elevated prostate specific antigen (PSA)    Hearing loss    Hyperlipidemia    Impotence of organic origin    Penile implant   Internal hemorrhoids    Neck pain    Otosclerosis of both ears    Peripheral vascular disease    Bilateral femoral bruit   Plantar fasciitis    Polymyalgia rheumatica    Rotator cuff syndrome of left shoulder    Scoliosis    Shoulder pain     Family History  Problem Relation Age of Onset   Heart disease Mother    Emphysema Mother    Colon cancer Neg Hx    Esophageal cancer Neg Hx    Rectal cancer Neg Hx    Stomach cancer Neg Hx     Social History   Socioeconomic History   Marital status: Married    Spouse name: Seif Teichert   Number of children: 2   Years of education: Not on file   Highest education level: Not on file  Occupational History   Occupation: Dealer: [employer]  Tobacco Use   Smoking status: Never   Smokeless tobacco: Never  Vaping Use   Vaping Use: Never used  Substance and Sexual Activity   Alcohol use: No    Alcohol/week: 0.0 standard drinks of alcohol   Drug use: No   Sexual activity: Not Currently  Other Topics Concern  Not on file  Social History Narrative   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   Social Determinants of Health   Financial Resource Strain: Not on file  Food Insecurity: No Food Insecurity (01/25/2023)   Hunger Vital Sign    Worried About Running Out of Food in the Last Year: Never true    Ran Out of Food in the Last Year: Never true  Transportation Needs: No Transportation Needs (01/25/2023)   PRAPARE - Administrator, Civil ServiceTransportation    Lack of Transportation (Medical): No    Lack of Transportation (Non-Medical): No  Physical Activity: Not on file  Stress: Not on file  Social Connections: Not on  file  Intimate Partner Violence: Not At Risk (01/25/2023)   Humiliation, Afraid, Rape, and Kick questionnaire    Fear of Current or Ex-Partner: No    Emotionally Abused: No    Physically Abused: No    Sexually Abused: No    Past Surgical History:  Procedure Laterality Date   ABDOMINAL AORTIC ENDOVASCULAR STENT GRAFT N/A 08/16/2017   Procedure: ABDOMINAL AORTIC ENDOVASCULAR STENT GRAFT insertion;  Surgeon: Nada LibmanBrabham, Vance W, MD;  Location: [facility] OR;  Service: Vascular;  Laterality: N/A;   Actinic keratosis removal  06/21/2010   Left shoulder; Dr. Irene LimboGoodrich   COLONOSCOPY     COLONOSCOPY W/ POLYPECTOMY     DUPUYTREN CONTRACTURE RELEASE Right 12/05/2013   Procedure: DUPUYTREN CONTRACTURE RELEASE RIGHT LONG, RING AND SMALL FINGERS;  Surgeon: Wyn Forsterobert V Sypher Jr., MD;  Location: [facility];  Service: Orthopedics;  Laterality: Right;   Implant penile pump  2001   IR ANGIOGRAM PELVIS SELECTIVE OR SUPRASELECTIVE  12/11/2020   IR ANGIOGRAM SELECTIVE EACH ADDITIONAL VESSEL  12/11/2020   IR EMBO ARTERIAL NOT HEMORR HEMANG INC GUIDE ROADMAPPING  12/11/2020   IR RADIOLOGIST EVAL & MGMT  07/30/2020   IR US GUIDE VASC ACCESS RIGHT  12/11/2020   Resection of appendix and tip of rectum  February 2005   STAPEDECTOMY Bilateral 1985, 1988   [employer]      Current Facility-Administered Medications:    acetaminophen (TYLENOL) tablet 650 mg, 650 mg, Oral, Q6H PRN **OR** acetaminophen (TYLENOL) suppository 650 mg, 650 mg, Rectal, Q6H PRN, Howerter, Justin B, DO   adenosine (ADENOCARD) 6 MG/2ML injection, , , ,    amiodarone (NEXTERONE PREMIX) 360-4.14 MG/200ML-% (1.8 mg/mL) IV infusion, 60 mg/hr, Intravenous, Continuous, Last Rate: 33.3 mL/hr at 03/06/23 0720, 60 mg/hr at 03/06/23 0720 **FOLLOWED BY** amiodarone (NEXTERONE PREMIX) 360-4.14 MG/200ML-% (1.8 mg/mL) IV infusion, 30 mg/hr, Intravenous, Continuous, Crosley, Debby, MD   ceFEPIme (MAXIPIME) 2 g in sodium chloride 0.9 % 100 mL IVPB, 2 g,  Intravenous, Q12H, Daylene PoseyOriet, Jonathan, RPH, Last Rate: 200 mL/hr at 03/06/23 0806, 2 g at 03/06/23 [id number]   Chlorhexidine Gluconate Cloth 2 % PADS 6 each, 6 each, Topical, Daily, Messick, Wil Slape C, MD   DAPTOmycin (CUBICIN) 600 mg in sodium chloride 0.9 % IVPB, 600 mg, Intravenous, Q2000, Wynetta FinesMessick, Robin Pafford C, MD, Stopped at 03/05/23 2018   fluorometholone (FML) 0.1 % ophthalmic suspension 1 drop, 1 drop, Right Eye, QHS, Howerter, Justin B, DO, 1 drop at 03/05/23 2346   folic acid (FOLVITE) tablet 1 mg, 1 mg, Oral, Daily, Howerter, Justin B, DO, 1 mg at 03/06/23 [id number]   heparin ADULT infusion 100 units/mL (25000 units/28050mL), 1,500 Units/hr, Intravenous, Continuous, Silvana NewnessMeyer, Andrew D, [facility], Last Rate: 15 mL/hr at 03/06/23 0805, 1,500 Units/hr at 03/06/23 0805   melatonin tablet  3 mg, 3 mg, Oral, QHS PRN, Howerter, Justin B, DO   ondansetron (ZOFRAN) injection 4 mg, 4 mg, Intravenous, Q6H PRN, Howerter, Justin B, DO   predniSONE (DELTASONE) tablet 15 mg, 15 mg, Oral, Q breakfast, Howerter, Justin B, DO, 15 mg at 03/06/23 1610   rosuvastatin (CRESTOR) tablet 5 mg, 5 mg, Oral, QHS, Howerter, Justin B, DO   sodium chloride flush (NS) 0.9 % injection 10-40 mL, 10-40 mL, Intracatheter, Q12H, Messick, Noralyn Pick, MD, 10 mL at 03/06/23 9604   sodium chloride flush (NS) 0.9 % injection 10-40 mL, 10-40 mL, Intracatheter, PRN, Wynetta Fines, MD   sulfamethoxazole-trimethoprim (BACTRIM DS) 800-160 MG per tablet 1 tablet, 1 tablet, Oral, Once per day on Mon Wed Fri, Howerter, Justin B, DO, 1 tablet at 03/06/23 5409  adenosine       Chlorhexidine Gluconate Cloth  6 each Topical Daily   fluorometholone  1 drop Right Eye QHS   folic acid  1 mg Oral Daily   predniSONE  15 mg Oral Q breakfast   rosuvastatin  5 mg Oral QHS   sodium chloride flush  10-40 mL Intracatheter Q12H   sulfamethoxazole-trimethoprim  1 tablet Oral Once per day on Mon Wed Fri    amiodarone 60 mg/hr (03/06/23 0720)   Followed by   amiodarone      ceFEPime (MAXIPIME) IV 2 g (03/06/23 0806)   DAPTOmycin (CUBICIN) 600 mg in sodium chloride 0.9 % IVPB Stopped (03/05/23 2018)   heparin 1,500 Units/hr (03/06/23 0805)    Physical Exam: Blood pressure (!) 140/79, pulse (!) 153, temperature 98.1 F (36.7 C), resp. rate (!) 22, height 6' (1.829 m), weight 77.1 kg, SpO2 (!) 74 %.    Affect appropriate Chronically ill elderly male  HEENT: normal Neck supple with no adenopathy JVP normal no bruits no thyromegaly Lungs basilar crackles  Heart:  S1/S2 MR  murmur, no rub, gallop or click PMI normal Abdomen: benighn, BS positve, no tenderness, no AAA no bruit.  No HSM or HJR Distal pulses intact with no bruits No edema Neuro non-focal Skin warm and dry No muscular weakness  Labs:   Lab Results  Component Value Date   WBC 22.0 (H) 03/06/2023   HGB 10.8 (L) 03/06/2023   HCT 33.6 (L) 03/06/2023   MCV 96.8 03/06/2023   PLT 346 03/06/2023    Recent Labs  Lab 03/06/23 0633  NA 134*  K 3.9  CL 100  CO2 21*  BUN 14  CREATININE 1.13  CALCIUM 8.0*  PROT 6.1*  BILITOT 0.5  ALKPHOS 77  ALT 32  AST 46*  GLUCOSE 116*   Lab Results  Component Value Date   CKTOTAL 106 03/05/2023   TROPONINI <0.03 06/22/2017    Lab Results  Component Value Date   CHOL 148 09/26/2016   CHOL 138 03/21/2016   CHOL 127 09/21/2015   Lab Results  Component Value Date   HDL 52 09/26/2016   HDL 56 03/21/2016   HDL 52 09/21/2015   Lab Results  Component Value Date   LDLCALC 82 09/26/2016   LDLCALC 68 03/21/2016   LDLCALC 66 09/21/2015   Lab Results  Component Value Date   TRIG 71 09/26/2016   TRIG 68 03/21/2016   TRIG 44 09/21/2015   Lab Results  Component Value Date   CHOLHDL 2.8 09/26/2016   CHOLHDL 2.5 03/21/2016   CHOLHDL 2.4 09/21/2015   No results found for: "LDLDIRECT"    Radiology: CT Angio Chest PE W  and/or Wo Contrast  Result Date: 03/05/2023 CLINICAL DATA:  Generalized weakness with urinary frequency. Patient on  antibiotic for unknown organism via PICC line. EXAM: CT ANGIOGRAPHY CHEST WITH CONTRAST TECHNIQUE: Multidetector CT imaging of the chest was performed using the standard protocol during bolus administration of intravenous contrast. Multiplanar CT image reconstructions and MIPs were obtained to evaluate the vascular anatomy. RADIATION DOSE REDUCTION: This exam was performed according to the departmental dose-optimization program which includes automated exposure control, adjustment of the mA and/or kV according to patient size and/or use of iterative reconstruction technique. CONTRAST:  41mL OMNIPAQUE IOHEXOL 350 MG/ML SOLN COMPARISON:  Chest radiographs earlier today and CTA chest 12/02/2022 FINDINGS: Cardiovascular: Satisfactory opacification of the pulmonary arteries to the segmental level. Acute pulmonary embolism arising at the distal right main pulmonary artery and extending into the lobar, segmental and subsegmental branches of the right middle and lower lobe pulmonary arteries. Diffuse aneurysmal dilation of the ascending, transverse, and descending thoracic aorta. The mid ascending aorta measures 44 mm in maximum diameter. The proximal descending thoracic aorta measures 51 mm. These are not significantly changed from 12/02/2022. Calcified atherosclerotic plaque. Prominent PICC tip in the low SVC. Mild cardiomegaly. Coronary artery calcification. Mediastinum/Nodes: Prominent mediastinal and hilar nodes measuring up to 1.2 cm in the left hilum (5/73 are favored reactive. Unremarkable esophagus. Lungs/Pleura: Bilateral patchy ground-glass and consolidative opacities with interlobular septal thickening suggestive of multifocal pneumonia. This is superimposed on a background of peripheral subpleural reticulation and bronchiolectasis compatible with interstitial lung disease. Small bilateral pleural effusions. No pneumothorax. Upper Abdomen: No acute abnormality. Musculoskeletal: No chest wall abnormality. No  acute osseous findings. Review of the MIP images confirms the above findings. IMPRESSION: 1. Acute pulmonary embolism arising at the distal right main pulmonary artery and extending into right middle and lower pulmonary arteries. CT evidence of right heart strain (RV/LV Ratio = 1.14) consistent with at least submassive (intermediate risk) PE. The presence of right heart strain has been associated with an increased risk of morbidity and mortality. Please refer to the "Code PE Focused" order set in EPIC. 2. Pulmonary parenchymal findings compatible with multifocal pneumonia superimposed on a background of interstitial lung disease. 3.  Small bilateral pleural effusions. 4. Unchanged aneurysmal dilation of the ascending, transverse, and descending thoracic aorta with maximum diameter of 51 mm in the proximal descending aorta. Recommend semi-annual imaging followup by CTA or MRA and referral to cardiothoracic surgery if not already obtained. This recommendation follows 2010 ACCF/AHA/AATS/ACR/ASA/SCA/SCAI/SIR/STS/SVM Guidelines for the Diagnosis and Management of Patients With Thoracic Aortic Disease. Circulation. 2010; 121: K160-F09. Aortic aneurysm NOS (ICD10-I71.9) These results were called by telephone at the time of interpretation on 03/05/2023 at 7:58 pm to provider [facility] , who verbally acknowledged these results. Electronically Signed   By: Minerva Fester M.D.   On: 03/05/2023 19:58   CT Head Wo Contrast  Result Date: 03/05/2023 CLINICAL DATA:  Mental status change EXAM: CT HEAD WITHOUT CONTRAST TECHNIQUE: Contiguous axial images were obtained from the base of the skull through the vertex without intravenous contrast. RADIATION DOSE REDUCTION: This exam was performed according to the departmental dose-optimization program which includes automated exposure control, adjustment of the mA and/or kV according to patient size and/or use of iterative reconstruction technique. COMPARISON:  Head CT 12/30/2021  FINDINGS: Brain: No evidence of acute infarction, hemorrhage, hydrocephalus, extra-axial collection or mass lesion/mass effect. There is stable mild periventricular white matter hypodensity, likely chronic small vessel ischemic change. There is stable mild diffuse atrophy. Vascular:  Atherosclerotic calcifications are present within the cavernous internal carotid arteries. Skull: Normal. Negative for fracture or focal lesion. Sinuses/Orbits: No acute finding. Other: None. IMPRESSION: 1. No acute intracranial abnormality. 2. Stable mild chronic small vessel ischemic changes. 3. Stable mild diffuse atrophy. Electronically Signed   By: Darliss Cheney M.D.   On: 03/05/2023 19:39   DG Chest Port 1 View  Result Date: 03/05/2023 CLINICAL DATA:  sob EXAM: PORTABLE CHEST 1 VIEW COMPARISON:  January 24, 2023 FINDINGS: The cardiomediastinal silhouette is grossly unchanged and enlarged in contour.RIGHT upper extremity PICC tip terminates over the superior RIGHT atrium. No pleural effusion. No pneumothorax. Increased lateral opacities within the LEFT lung. Persistent background of coarse reticulation, similar in comparison to priors. IMPRESSION: Increased opacities within the lateral LEFT lung. Differential considerations include infection, infarction atelectasis. Electronically Signed   By: Meda Klinefelter M.D.   On: 03/05/2023 17:27    EKG: ST no acute ST changes    ASSESSMENT AND PLAN:   Atrial Flutter :  in setting of systemic inflammatory illness , PE and age Converted with iv amiodarone Continue for 24 hours and change to PO On heparin already in regard to PE PE:  In setting of multiple recent hospitalizations TTE to assess RV/LV function  Aortitis:  ? Wife indicates WBC scan negative ESR/CRP markedly elevated despite antibiotics and iv solucortef ID/IM need to consult with [facility] at [facility] Wife indicates video conference tomorrow with them He has old stent graft in place with prior endo leak and  embolization of lumbar/sacral feeders   Signed: Charlton Haws 03/06/2023, 8:30 AM

## 2023-03-06 NOTE — Consult Note (Signed)
Date of Admission:  03/05/2023          Reason for Consult: Possible infectious aortitis near endovascular stent graft    Referring Provider: Kathlen ModyVijaya Akula, MD   Assessment:  Infectious vs inflammatory aortitis seen on PET scan near endovascular stent grafat [facility] (but not seen on NM tagged WBC scan Acute pulmonary embolism with right heart strain and Atrial fibrillation now cardioverted on amiodarone "Mult focal pneumonia" seen on CTA, could certainly be pulmonary edema, but would want to DC his daptomycin in case this is daptomycin induced eosinophilic pneumonia ? Interstitial lung disease raised by Radiology on CTA--family and patient unaware of this Hx of PMR having been well controlled on prednisone for decades Chronic leukocytosis and elevated ESR, CRP MGUS  Plan:  I am exchanging his daptomycin for vancomycin cases "multifocal pneumonia is in fact an eosinophilic pneumonia from daptomycin I am ok with continuing broader spectrum gram negative coverage with cefepime though I very much doubt a pseudomonal or or other GNR having something to do with his inflltrates I will touch base with Dr. Truddie HiddenJulia Sung, [facility] to [facility] and his current clinical condition, and consider repeating some of serologies now that we are weeks [facility]. THey have a video conference with her tomorrow when I am in clinic I have asked Pulmonary CCM to see him for 2 main reasons, #1 his high O2 requirements and #2 in case they think he indeed has eosinophilic pneumonia and needs higher dose of steroids (I dont think story is compeling but there is no need for risk of continuing dapto and likely low risk to increasing steroids in him Will order dopplers of UE and if negative of LE  Principal Problem:   Acute pulmonary embolism Active Problems:   Hyperlipidemia   PMR (polymyalgia rheumatica)   AAA (abdominal aortic aneurysm) without rupture (HCC)    Generalized weakness   Acute hypoxic respiratory failure   Acute hyponatremia   Elevated troponin   Lactic acidosis   HCAP (healthcare-associated pneumonia)   Chronic diastolic CHF (congestive heart failure)   BPH (benign prostatic hyperplasia)   Pulmonary embolism   Scheduled Meds:  adenosine       Chlorhexidine Gluconate Cloth  6 each Topical Daily   [START ON 03/07/2023] enoxaparin (LOVENOX) injection  80 mg Subcutaneous Q12H   fluorometholone  1 drop Right Eye QHS   folic acid  1 mg Oral Daily   pantoprazole  40 mg Oral Daily   predniSONE  15 mg Oral Q breakfast   rosuvastatin  5 mg Oral QHS   sodium chloride flush  10-40 mL Intracatheter Q12H   sulfamethoxazole-trimethoprim  1 tablet Oral Once per day on Mon Wed Fri   Continuous Infusions:  amiodarone 30 mg/hr (03/06/23 1417)   ceFEPime (MAXIPIME) IV 2 g (03/06/23 0806)   PRN Meds:.acetaminophen **OR** acetaminophen, adenosine, hydrOXYzine, melatonin, ondansetron (ZOFRAN) IV, sodium chloride flush  HPI: [name] is a 89 y.o. male with medical history significant for polymyalgia rheumatica on chronic prednisone therapy, AAA status post endovascular stent graft in September 2018, chronic diastolic heart failure, MGUS who had a history of profound fatigue which went on for 8 months following an acute flulike illness.  This was initially thought to be a postviral fatigue syndrome but he had chronically elevated inflammatory markers including a sed rate in the 100 range with concern for ongoing inflammatory condition.  He  also had intermittent fevers throughout this time.  He was worked up at [facility] and seen in the infectious disease clinic by Dr. Truddie Hidden who initiated a work up  wit blood cultures (no growth, TTE, HIV, hepatitides, RPR Bartonella and Q fever Brucella serologies which were all negative. When repeat WBC came back elevated after consultation with Dr. Alberteen Spindle with Nephrology had patient admitted to [facility]  on 3/12/-02/08/2023. to expedite workup.    TTE negative for endocarditis  PET scan at [facility] showed :  "-Infrarenal aortobiiliac endovascular stent graft with layering embolic material and coils along the inferior aspect of the excluded aneurysm sac. There is discontinuous and nodular increased uptake around the superficial mid/inferior portion of the excluded sac (CT 200) which is greater than the blood pool.  - Additional site of increased uptake posteriorly along the inferior aortic portion of the stent graft (CT 187).  -No suspicious hypermetabolic activity involving the remaining visualized large vessels.  -Aneurysmal dilation of the ascending thoracic aorta measuring up to 4.5 cm as well as the superior descending thoracic aorta measuring up to 5.0 cm (CT 94). No suspicious focal uptake within the thoracic aorta.  -Scattered aortic atherosclerotic calcifications.   Tagged WBC done on 3202024 was negative for uptake in aorta or elsewhere.  Due to concerns that the patient could have an infectious aortitis he was placed on daptomycin and ceftriaxone with plans for 6 weeks of treatment.  He was also placed on Bactrim TIW for PCP prophylaxis.   His inflammatory markers were going to be followed as a potential marker for successful response to antibiotic therapy.  The patient in the interval had improved certainly compared to when he was hospitalized at [facility].  Over the 2 days prior to the weekend while they were visiting the building of a plan for [employer] in [facility] the patient developed worsening generalized malaise and weakness.  It is progressed to the point where he was having difficulty getting out of bed and the day prior to admission.  His weakness worsened he was ultimately brought to the emergency department via EMS.  Of note he had had his prednisone cut from 20 mg to 15mg  mg Dr. Lisbeth Ply recently and there was concern that there might be some adrenal  insufficiency causing his acute decline.  CTA done in ER subsequently showed an acute pulmonary embolism in the distal right main pulmonary artery and lobar pulmonary arteries showing right heart strain since it with a submassive pulmonary embolism.  Lung parenchyma itself had multifocal infiltrates seen superimposed on a background of an interstitial lung disease like pattern or also bilateral pleural effusions and unchanged aneurysmal dilation of the ascending transverse and descending thoracic aorta  He was placed on coagulation.  Note in the interval he also developed atrial flutter but then converted to sinus rhythm on amiodarone.  He is currently requiring 7 L via [place] (at baseline he has no O2 requirements)  Laboratory markers are up again but I do not really think we can use this as a helpful piece of data in the context of trying to understand if he does or does not have an infectious aortitis.  He has multiple confounders including most notably he is recent acute pulmonary embolism  Critical care pulmonary medicine to see the patient given his high oxygen requirements and given the fact to be helpful to see what they think about the idea of a eosinophilic pneumonia from daptomycin.  Would  certainly be unusual to see this developing at the same time that the patient is coming in for acute pulmonary embolism and while on steroids.  That being said I do not see that is worth the risk of keeping him on daptomycin given this possibility.  I spent 112 minutes with the patient including  time in face to face counseling of the patient, his son and his wife re the above problems personally reviewing CTA, PET scan report, NM scan along with review of local and outside medical records in preparation for the visit and during the visit and in coordination of nis care with [facility], CCM.  I have reached out to Dr. Dolores Frame via her cell phone (text) at [facility]  One "loose end" was mention of  Quantiferon Gold in last inpatient ID note but I cannot locate it in "Care Everywhere". I certainly DO NOT think patient has pulmonary TB but this lab was part of his workup for aortitis.       Review of Systems: Review of Systems  Constitutional:  Positive for malaise/fatigue. Negative for chills, fever and weight loss.  HENT:  Negative for congestion and sore throat.   Eyes:  Negative for blurred vision and photophobia.  Respiratory:  Positive for cough. Negative for shortness of breath and wheezing.   Cardiovascular:  Negative for chest pain, palpitations and leg swelling.  Gastrointestinal:  Negative for abdominal pain, blood in stool, constipation, diarrhea, heartburn, melena, nausea and vomiting.  Genitourinary:  Negative for dysuria, flank pain and hematuria.  Musculoskeletal:  Negative for back pain, falls, joint pain and myalgias.  Skin:  Negative for itching and rash.  Neurological:  Positive for weakness. Negative for dizziness, focal weakness, loss of consciousness and headaches.  Endo/Heme/Allergies:  Does not bruise/bleed easily.  Psychiatric/Behavioral:  Negative for depression and suicidal ideas. The patient does not have insomnia.     Past Medical History:  Diagnosis Date   AAA (abdominal aortic aneurysm)    Abnormal EKG    Left atrial abnormality   Allergic rhinitis    Allergy    Benign neoplasm of colon 04/14/10   3 small polyps on colonoscopy by Dr. Marina Goodell   Carpal tunnel syndrome    Cataract    Degenerative disc disease    Disturbances metabolism of methionine, homocystine, and cystathionine    Elevated homocysteine   Elevated prostate specific antigen (PSA)    Hearing loss    Hyperlipidemia    Impotence of organic origin    Penile implant   Internal hemorrhoids    Neck pain    Otosclerosis of both ears    Peripheral vascular disease    Bilateral femoral bruit   Plantar fasciitis    Polymyalgia rheumatica    Rotator cuff syndrome of left shoulder     Scoliosis    Shoulder pain     Social History   Tobacco Use   Smoking status: Never   Smokeless tobacco: Never  Vaping Use   Vaping Use: Never used  Substance Use Topics   Alcohol use: No    Alcohol/week: 0.0 standard drinks of alcohol   Drug use: No    Family History  Problem Relation Age of Onset   Heart disease Mother    Emphysema Mother    Colon cancer Neg Hx    Esophageal cancer Neg Hx    Rectal cancer Neg Hx    Stomach cancer Neg Hx    Allergies  Allergen Reactions   Amoxicillin Other (  See Comments)   Codeine Nausea And Vomiting   Doxycycline Other (See Comments)    Upset stomach, currently taking and tolerating well     Morphine And Related Nausea And Vomiting    OBJECTIVE: Blood pressure 120/67, pulse 83, temperature 98.7 F (37.1 C), temperature source Oral, resp. rate 19, height 6' (1.829 m), weight 77.1 kg, SpO2 90 %.  Physical Exam Constitutional:      Appearance: He is well-developed.  HENT:     Head: Normocephalic and atraumatic.  Eyes:     Extraocular Movements: Extraocular movements intact.     Conjunctiva/sclera: Conjunctivae normal.  Cardiovascular:     Rate and Rhythm: Normal rate and regular rhythm.     Heart sounds:     No friction rub. No gallop.  Pulmonary:     Effort: No respiratory distress.     Breath sounds: No stridor. No decreased breath sounds or wheezing.  Abdominal:     General: There is no distension.     Palpations: Abdomen is soft.  Musculoskeletal:        General: No tenderness. Normal range of motion.     Cervical back: Normal range of motion and neck supple.  Skin:    General: Skin is warm and dry.     Coloration: Skin is not pale.     Findings: No erythema or rash.  Neurological:     General: No focal deficit present.     Mental Status: He is alert and oriented to person, place, and time.  Psychiatric:        Mood and Affect: Mood normal.        Behavior: Behavior normal.        Thought Content: Thought  content normal.        Judgment: Judgment normal.    PICC line is clean and without evidence of infection or overt clot  Lab Results Lab Results  Component Value Date   WBC 22.0 (H) 03/06/2023   HGB 10.8 (L) 03/06/2023   HCT 33.6 (L) 03/06/2023   MCV 96.8 03/06/2023   PLT 346 03/06/2023    Lab Results  Component Value Date   CREATININE 1.13 03/06/2023   BUN 14 03/06/2023   NA 134 (L) 03/06/2023   K 3.9 03/06/2023   CL 100 03/06/2023   CO2 21 (L) 03/06/2023    Lab Results  Component Value Date   ALT 32 03/06/2023   AST 46 (H) 03/06/2023   ALKPHOS 77 03/06/2023   BILITOT 0.5 03/06/2023     Microbiology: Recent Results (from the past 240 hour(s))  Culture, blood (routine x 2)     Status: None (Preliminary result)   Collection Time: 03/05/23  4:14 PM   Specimen: BLOOD  Result Value Ref Range Status   Specimen Description BLOOD SITE NOT SPECIFIED  Final   Special Requests   Final    BOTTLES DRAWN AEROBIC AND ANAEROBIC Blood Culture adequate volume   Culture   Final    NO GROWTH < 24 HOURS Performed at [facility], [address] [place], [place] [address]    Report Status PENDING  Incomplete  Resp panel by RT-PCR (RSV, Flu A&B, Covid) Anterior Nasal Swab     Status: None   Collection Time: 03/05/23  4:29 PM   Specimen: Anterior Nasal Swab  Result Value Ref Range Status   SARS Coronavirus 2 by RT PCR NEGATIVE NEGATIVE Final   Influenza A by PCR NEGATIVE NEGATIVE Final  Influenza B by PCR NEGATIVE NEGATIVE Final    Comment: (NOTE) The Xpert Xpress SARS-CoV-2/FLU/RSV plus assay is intended as an aid in the diagnosis of influenza from Nasopharyngeal swab specimens and should not be used as a sole basis for treatment. Nasal washings and aspirates are unacceptable for Xpert Xpress SARS-CoV-2/FLU/RSV testing.  Fact Sheet for Patients: BloggerCourse.com  Fact Sheet for Healthcare  Providers: SeriousBroker.it  This test is not yet approved or cleared by the [place] FDA and has been authorized for detection and/or diagnosis of SARS-CoV-2 by FDA under an Emergency Use Authorization (EUA). This EUA will remain in effect (meaning this test can be used) for the duration of the COVID-19 declaration under Section 564(b)(1) of the Act, 21 U.S.C. section 360bbb-3(b)(1), unless the authorization is terminated or revoked.     Resp Syncytial Virus by PCR NEGATIVE NEGATIVE Final    Comment: (NOTE) Fact Sheet for Patients: BloggerCourse.com  Fact Sheet for Healthcare Providers: SeriousBroker.it  This test is not yet approved or cleared by the [place] FDA and has been authorized for detection and/or diagnosis of SARS-CoV-2 by FDA under an Emergency Use Authorization (EUA). This EUA will remain in effect (meaning this test can be used) for the duration of the COVID-19 declaration under Section 564(b)(1) of the Act, 21 U.S.C. section 360bbb-3(b)(1), unless the authorization is terminated or revoked.  Performed at [facility], [address] [place], [place] [address]   Culture, blood (routine x 2)     Status: None (Preliminary result)   Collection Time: 03/05/23  4:33 PM   Specimen: BLOOD LEFT FOREARM  Result Value Ref Range Status   Specimen Description BLOOD LEFT FOREARM  Final   Special Requests   Final    BOTTLES DRAWN AEROBIC AND ANAEROBIC Blood Culture results may not be optimal due to an excessive volume of blood received in culture bottles   Culture   Final    NO GROWTH < 24 HOURS Performed at [facility], [address], [place], [place] [address]    Report Status PENDING  Incomplete    Acey Lav, MD [facility] [phone] pager  03/06/2023, 4:02 PM

## 2023-03-06 NOTE — Progress Notes (Signed)
[facility]                                                                               [name], is a 89 y.o. male, DOB - 10/29/1930, SFK:[id number] Admit date - 03/05/2023    Outpatient Primary MD for the patient is Randall Ran, MD  LOS - 0  days    Brief summary   [name] is a 89 y.o. male with medical history significant for polymyalgia rheumatica on chronic prednisone therapy, AAA status post endovascular stent graft in September 2018, chronic diastolic heart failure, recent diagnosis of aortitis, chronic leukocytosis,is admitted to [facility] on 03/05/2023 with acute pulmonary embolism after presenting from home to [facility] ED complaining of generalized weakness.   Patient was recently hospitalized  at [facility] in for generalized weakness, was found to have aortitis of unclear etiology and discharged home IV antibiotics including Daptomycin, rocephin and Bactrim. However, he developed worsening weakness, with right shoulder pain. Rpt imaging of the chest shows  Acute pulmonary embolism arising at the distal right main pulmonary artery and extending into right middle and lower pulmonary arteries. Pulmonary parenchymal findings compatible with multifocal pneumonia superimposed on a background of interstitial lung disease. Unchanged aneurysmal dilation of the ascending, transverse, and descending thoracic aorta with maximum diameter of 51 mm in the proximal descending aorta, unchanged from the study in 11/2022.   Assessment & Plan    Assessment and Plan:   Acute respiratory failure with hypoxia secondary to acute pulmonary embolism on the right, multifocal pneumonia , interstitial lung disease.  Patient is currently requiring between 5 to 6 lit of [place] oxygen.  He was initially started on IV heparin but transitioned to lovenox, as therapeutic levels couldn't be achieved.  Echocardiogram ordered to check for right heart strain.    Multifocal pneumonia  Started on  IV vancomycin( daptomycin discontinued), cefepime and Bactrim.  Lactic acid normalized.  ID consulted for recommendations on the duration of antibiotics.    Interstitial Lung Disease evident on CT: New diagnosis.  ESR 89, CRP 75.    AAA S/P stent , graft repair:embolization of  L5 lumbar and sacral arteries. CTA of the chest  Unchanged aneurysmal dilation of the ascending, transverse, and descending thoracic aorta with maximum diameter of 51 mm in the proximal descending aorta, unchanged from the study in 11/2022.   Atrial flutter: in the setting of PE and multifocal pneumonia.  Converted to NSR with amiodarone gtt and metoprolol.  TSH wnl.    PMR:  On chronic steroids.  Patient reports pain in the right shoulder, ? Myositis vs PMR flare.  If it continues will probably need to stop the statin.    GERD; Stable on PPI.    Chronic leukocytosis:  - ? On steroids.    Anemia of chronic disease:  Hemoglobin around 10. Monitor.   Estimated body mass index is 23.06 kg/m as calculated from the following:   Height as of this encounter: 6' (1.829 m).   Weight as of this encounter: 77.1 kg.  Code Status: Full code.  DVT Prophylaxis:  Lovenox.    Level of Care: Level of care: Progressive Family  Communication: discussed with wife at bedside.   Disposition Plan:     Remains inpatient appropriate:  IV antibiotics. Further eval of acute respiratory failure with hypoxia.   Procedures:  Echocardiogram.   Consultants:   Cardiology ID.  PCCM.  Antimicrobials:   Anti-infectives (From admission, onward)    Start     Dose/Rate Route Frequency Ordered Stop   03/06/23 0900  sulfamethoxazole-trimethoprim (BACTRIM DS) 800-160 MG per tablet 1 tablet        1 tablet Oral Once per day on Mon Wed Fri 03/05/23 2141     03/06/23 0800  ceFEPIme (MAXIPIME) 2 g in sodium chloride 0.9 % 100 mL IVPB        2 g 200 mL/hr over 30 Minutes Intravenous Every 12 hours 03/05/23 2140     03/05/23  1845  DAPTOmycin (CUBICIN) 600 mg in sodium chloride 0.9 % IVPB        600 mg 124 mL/hr over 30 Minutes Intravenous Daily 03/05/23 1833     03/05/23 1645  ceFEPIme (MAXIPIME) 2 g in sodium chloride 0.9 % 100 mL IVPB        2 g 200 mL/hr over 30 Minutes Intravenous  Once 03/05/23 1639 03/05/23 2018   03/05/23 1645  vancomycin (VANCOREADY) IVPB 1500 mg/300 mL  Status:  Discontinued        1,500 mg 150 mL/hr over 120 Minutes Intravenous  Once 03/05/23 1639 03/05/23 1645        Medications  Scheduled Meds:  adenosine       Chlorhexidine Gluconate Cloth  6 each Topical Daily   fluorometholone  1 drop Right Eye QHS   folic acid  1 mg Oral Daily   pantoprazole  40 mg Oral Daily   predniSONE  15 mg Oral Q breakfast   rosuvastatin  5 mg Oral QHS   sodium chloride flush  10-40 mL Intracatheter Q12H   sulfamethoxazole-trimethoprim  1 tablet Oral Once per day on Mon Wed Fri   Continuous Infusions:  amiodarone 30 mg/hr (03/06/23 1417)   ceFEPime (MAXIPIME) IV 2 g (03/06/23 0806)   DAPTOmycin (CUBICIN) 600 mg in sodium chloride 0.9 % IVPB Stopped (03/05/23 2018)   heparin 1,500 Units/hr (03/06/23 1145)   PRN Meds:.acetaminophen **OR** acetaminophen, adenosine, hydrOXYzine, melatonin, ondansetron (ZOFRAN) IV, sodium chloride flush    Subjective:   [name] was seen and examined today.  Right shoulder pain.    Objective:   Vitals:   03/06/23 0714 03/06/23 0715 03/06/23 0800 03/06/23 0900  BP:  (!) 178/154 131/60   Pulse: 94 93 91   Resp:   19 20  Temp:   98.9 F (37.2 C)   TempSrc:   Axillary   SpO2: 93% 91%    Weight:      Height:        Intake/Output Summary (Last 24 hours) at 03/06/2023 1455 Last data filed at 03/06/2023 0800 Gross per 24 hour  Intake 2140 ml  Output 300 ml  Net 1840 ml   Filed Weights   03/05/23 1642 03/05/23 2200 03/06/23 0624  Weight: 78.7 kg 77.1 kg 77.1 kg     Exam General exam: Appears calm and comfortable  Respiratory system:  diminished air entry at bases. On 6 lit of [place] oxygen.  Cardiovascular system: S1 & S2 heard, RRR. No JVD,  Gastrointestinal system: Abdomen is nondistended, soft and nontender. . Central nervous system: Alert and oriented. Extremities: Symmetric 5 x 5 power. Skin: No rashes,  Psychiatry:  Mood & affect appropriate.    Data Reviewed:  I have personally reviewed following labs and imaging studies   CBC Lab Results  Component Value Date   WBC 22.0 (H) 03/06/2023   RBC 3.47 (L) 03/06/2023   HGB 10.8 (L) 03/06/2023   HCT 33.6 (L) 03/06/2023   MCV 96.8 03/06/2023   MCH 31.1 03/06/2023   PLT 346 03/06/2023   MCHC 32.1 03/06/2023   RDW 16.1 (H) 03/06/2023   LYMPHSABS 0.6 (L) 03/05/2023   MONOABS 1.0 03/05/2023   EOSABS 0.2 03/05/2023   BASOSABS 0.2 (H) 03/05/2023     Last metabolic panel Lab Results  Component Value Date   NA 134 (L) 03/06/2023   K 3.9 03/06/2023   CL 100 03/06/2023   CO2 21 (L) 03/06/2023   BUN 14 03/06/2023   CREATININE 1.13 03/06/2023   GLUCOSE 116 (H) 03/06/2023   GFRNONAA >60 03/06/2023   GFRAA >60 01/24/2018   CALCIUM 8.0 (L) 03/06/2023   PHOS 3.0 03/06/2023   PROT 6.1 (L) 03/06/2023   ALBUMIN 2.2 (L) 03/06/2023   LABGLOB 2.5 03/21/2016   AGRATIO 1.6 03/21/2016   BILITOT 0.5 03/06/2023   ALKPHOS 77 03/06/2023   AST 46 (H) 03/06/2023   ALT 32 03/06/2023   ANIONGAP 13 03/06/2023    CBG (last 3)  Recent Labs    03/05/23 1630  GLUCAP 136*      Coagulation Profile: No results for input(s): "INR", "PROTIME" in the last 168 hours.   Radiology Studies: CT Angio Chest PE W and/or Wo Contrast  Result Date: 03/05/2023 CLINICAL DATA:  Generalized weakness with urinary frequency. Patient on antibiotic for unknown organism via PICC line. EXAM: CT ANGIOGRAPHY CHEST WITH CONTRAST TECHNIQUE: Multidetector CT imaging of the chest was performed using the standard protocol during bolus administration of intravenous contrast. Multiplanar CT image  reconstructions and MIPs were obtained to evaluate the vascular anatomy. RADIATION DOSE REDUCTION: This exam was performed according to the departmental dose-optimization program which includes automated exposure control, adjustment of the mA and/or kV according to patient size and/or use of iterative reconstruction technique. CONTRAST:  25mL OMNIPAQUE IOHEXOL 350 MG/ML SOLN COMPARISON:  Chest radiographs earlier today and CTA chest 12/02/2022 FINDINGS: Cardiovascular: Satisfactory opacification of the pulmonary arteries to the segmental level. Acute pulmonary embolism arising at the distal right main pulmonary artery and extending into the lobar, segmental and subsegmental branches of the right middle and lower lobe pulmonary arteries. Diffuse aneurysmal dilation of the ascending, transverse, and descending thoracic aorta. The mid ascending aorta measures 44 mm in maximum diameter. The proximal descending thoracic aorta measures 51 mm. These are not significantly changed from 12/02/2022. Calcified atherosclerotic plaque. Prominent PICC tip in the low SVC. Mild cardiomegaly. Coronary artery calcification. Mediastinum/Nodes: Prominent mediastinal and hilar nodes measuring up to 1.2 cm in the left hilum (5/73 are favored reactive. Unremarkable esophagus. Lungs/Pleura: Bilateral patchy ground-glass and consolidative opacities with interlobular septal thickening suggestive of multifocal pneumonia. This is superimposed on a background of peripheral subpleural reticulation and bronchiolectasis compatible with interstitial lung disease. Small bilateral pleural effusions. No pneumothorax. Upper Abdomen: No acute abnormality. Musculoskeletal: No chest wall abnormality. No acute osseous findings. Review of the MIP images confirms the above findings. IMPRESSION: 1. Acute pulmonary embolism arising at the distal right main pulmonary artery and extending into right middle and lower pulmonary arteries. CT evidence of right heart  strain (RV/LV Ratio = 1.14) consistent with at least submassive (intermediate risk) PE. The presence of right heart strain  has been associated with an increased risk of morbidity and mortality. Please refer to the "Code PE Focused" order set in EPIC. 2. Pulmonary parenchymal findings compatible with multifocal pneumonia superimposed on a background of interstitial lung disease. 3.  Small bilateral pleural effusions. 4. Unchanged aneurysmal dilation of the ascending, transverse, and descending thoracic aorta with maximum diameter of 51 mm in the proximal descending aorta. Recommend semi-annual imaging followup by CTA or MRA and referral to cardiothoracic surgery if not already obtained. This recommendation follows 2010 ACCF/AHA/AATS/ACR/ASA/SCA/SCAI/SIR/STS/SVM Guidelines for the Diagnosis and Management of Patients With Thoracic Aortic Disease. Circulation. 2010; 121: Z610-R60. Aortic aneurysm NOS (ICD10-I71.9) These results were called by telephone at the time of interpretation on 03/05/2023 at 7:58 pm to provider [facility] , who verbally acknowledged these results. Electronically Signed   By: Minerva Fester M.D.   On: 03/05/2023 19:58   CT Head Wo Contrast  Result Date: 03/05/2023 CLINICAL DATA:  Mental status change EXAM: CT HEAD WITHOUT CONTRAST TECHNIQUE: Contiguous axial images were obtained from the base of the skull through the vertex without intravenous contrast. RADIATION DOSE REDUCTION: This exam was performed according to the departmental dose-optimization program which includes automated exposure control, adjustment of the mA and/or kV according to patient size and/or use of iterative reconstruction technique. COMPARISON:  Head CT 12/30/2021 FINDINGS: Brain: No evidence of acute infarction, hemorrhage, hydrocephalus, extra-axial collection or mass lesion/mass effect. There is stable mild periventricular white matter hypodensity, likely chronic small vessel ischemic change. There is stable mild  diffuse atrophy. Vascular: Atherosclerotic calcifications are present within the cavernous internal carotid arteries. Skull: Normal. Negative for fracture or focal lesion. Sinuses/Orbits: No acute finding. Other: None. IMPRESSION: 1. No acute intracranial abnormality. 2. Stable mild chronic small vessel ischemic changes. 3. Stable mild diffuse atrophy. Electronically Signed   By: Darliss Cheney M.D.   On: 03/05/2023 19:39   DG Chest Port 1 View  Result Date: 03/05/2023 CLINICAL DATA:  sob EXAM: PORTABLE CHEST 1 VIEW COMPARISON:  January 24, 2023 FINDINGS: The cardiomediastinal silhouette is grossly unchanged and enlarged in contour.RIGHT upper extremity PICC tip terminates over the superior RIGHT atrium. No pleural effusion. No pneumothorax. Increased lateral opacities within the LEFT lung. Persistent background of coarse reticulation, similar in comparison to priors. IMPRESSION: Increased opacities within the lateral LEFT lung. Differential considerations include infection, infarction atelectasis. Electronically Signed   By: Meda Klinefelter M.D.   On: 03/05/2023 17:27       Kathlen Mody M.D. [facility] 03/06/2023, 2:55 PM  Available via Epic secure chat 7am-7pm After 7 pm, please refer to night coverage provider listed on amion.

## 2023-03-07 ENCOUNTER — Inpatient Hospital Stay (HOSPITAL_COMMUNITY): Payer: Medicare Other

## 2023-03-07 DIAGNOSIS — I2699 Other pulmonary embolism without acute cor pulmonale: Secondary | ICD-10-CM

## 2023-03-07 DIAGNOSIS — G4709 Other insomnia: Secondary | ICD-10-CM

## 2023-03-07 DIAGNOSIS — E872 Acidosis, unspecified: Secondary | ICD-10-CM | POA: Diagnosis not present

## 2023-03-07 DIAGNOSIS — F19982 Other psychoactive substance use, unspecified with psychoactive substance-induced sleep disorder: Secondary | ICD-10-CM

## 2023-03-07 DIAGNOSIS — J9601 Acute respiratory failure with hypoxia: Secondary | ICD-10-CM | POA: Diagnosis not present

## 2023-03-07 DIAGNOSIS — J189 Pneumonia, unspecified organism: Secondary | ICD-10-CM | POA: Diagnosis not present

## 2023-03-07 DIAGNOSIS — M353 Polymyalgia rheumatica: Secondary | ICD-10-CM | POA: Diagnosis not present

## 2023-03-07 DIAGNOSIS — I824Z1 Acute embolism and thrombosis of unspecified deep veins of right distal lower extremity: Secondary | ICD-10-CM

## 2023-03-07 DIAGNOSIS — I82409 Acute embolism and thrombosis of unspecified deep veins of unspecified lower extremity: Secondary | ICD-10-CM | POA: Diagnosis not present

## 2023-03-07 DIAGNOSIS — F419 Anxiety disorder, unspecified: Secondary | ICD-10-CM

## 2023-03-07 DIAGNOSIS — T380X5D Adverse effect of glucocorticoids and synthetic analogues, subsequent encounter: Secondary | ICD-10-CM

## 2023-03-07 DIAGNOSIS — I483 Typical atrial flutter: Secondary | ICD-10-CM | POA: Diagnosis not present

## 2023-03-07 DIAGNOSIS — I4891 Unspecified atrial fibrillation: Secondary | ICD-10-CM

## 2023-03-07 DIAGNOSIS — G4700 Insomnia, unspecified: Secondary | ICD-10-CM

## 2023-03-07 LAB — COMPREHENSIVE METABOLIC PANEL
ALT: 38 U/L (ref 0–44)
AST: 49 U/L — ABNORMAL HIGH (ref 15–41)
Albumin: 1.9 g/dL — ABNORMAL LOW (ref 3.5–5.0)
Alkaline Phosphatase: 81 U/L (ref 38–126)
Anion gap: 9 (ref 5–15)
BUN: 14 mg/dL (ref 8–23)
CO2: 21 mmol/L — ABNORMAL LOW (ref 22–32)
Calcium: 7.5 mg/dL — ABNORMAL LOW (ref 8.9–10.3)
Chloride: 100 mmol/L (ref 98–111)
Creatinine, Ser: 1.04 mg/dL (ref 0.61–1.24)
GFR, Estimated: >60 mL/min (ref >60)
Glucose, Bld: 187 mg/dL — ABNORMAL HIGH (ref 70–99)
Potassium: 3.9 mmol/L (ref 3.5–5.1)
Sodium: 130 mmol/L — ABNORMAL LOW (ref 135–145)
Total Bilirubin: 0.3 mg/dL (ref 0.3–1.2)
Total Protein: 5.9 g/dL — ABNORMAL LOW (ref 6.5–8.1)

## 2023-03-07 LAB — HEMOGLOBIN A1C
Hgb A1c MFr Bld: 6.3 % — ABNORMAL HIGH (ref 4.8–5.6)
Mean Plasma Glucose: 134.11 mg/dL

## 2023-03-07 LAB — GLUCOSE, CAPILLARY
Glucose-Capillary: 230 mg/dL — ABNORMAL HIGH (ref 70–99)
Glucose-Capillary: 185 mg/dL — ABNORMAL HIGH (ref 70–99)
Glucose-Capillary: 186 mg/dL — ABNORMAL HIGH (ref 70–99)

## 2023-03-07 LAB — CBC WITH DIFFERENTIAL/PLATELET
Abs Immature Granulocytes: 0 10*3/uL (ref 0.00–0.07)
Basophils Absolute: 0 10*3/uL (ref 0.0–0.1)
Basophils Relative: 0 %
Eosinophils Absolute: 0 10*3/uL (ref 0.0–0.5)
Eosinophils Relative: 0 %
HCT: 32.4 % — ABNORMAL LOW (ref 39.0–52.0)
Hemoglobin: 10.7 g/dL — ABNORMAL LOW (ref 13.0–17.0)
Lymphocytes Relative: 2 %
Lymphs Abs: 0.5 10*3/uL — ABNORMAL LOW (ref 0.7–4.0)
MCH: 31.6 pg (ref 26.0–34.0)
MCHC: 33 g/dL (ref 30.0–36.0)
MCV: 95.6 fL (ref 80.0–100.0)
Monocytes Absolute: 0.3 10*3/uL (ref 0.1–1.0)
Monocytes Relative: 1 %
Neutro Abs: 25.3 10*3/uL — ABNORMAL HIGH (ref 1.7–7.7)
Neutrophils Relative %: 97 %
Platelets: 325 10*3/uL (ref 150–400)
RBC: 3.39 MIL/uL — ABNORMAL LOW (ref 4.22–5.81)
RDW: 16 % — ABNORMAL HIGH (ref 11.5–15.5)
WBC: 26.1 10*3/uL — ABNORMAL HIGH (ref 4.0–10.5)
nRBC: 0 % (ref 0.0–0.2)

## 2023-03-07 LAB — MYCOPLASMA PNEUMONIAE ANTIBODY, IGM: Mycoplasma pneumo IgM: <770 U/mL (ref 0–769)

## 2023-03-07 LAB — PROCALCITONIN: Procalcitonin: 1.82 ng/mL

## 2023-03-07 LAB — MAGNESIUM: Magnesium: 1.9 mg/dL (ref 1.7–2.4)

## 2023-03-07 LAB — CULTURE, BLOOD (ROUTINE X 2)

## 2023-03-07 MED ORDER — TRAZODONE HCL 50 MG PO TABS: 25.0000 mg | ORAL_TABLET | Freq: Every day | ORAL | Status: DC

## 2023-03-07 MED ORDER — INSULIN ASPART 100 UNIT/ML IJ SOLN
0.0000 [IU] | Freq: Three times a day (TID) | INTRAMUSCULAR | Status: DC
  Administered 2023-03-07: 3 [IU] via SUBCUTANEOUS
  Administered 2023-03-07: 2 [IU] via SUBCUTANEOUS
  Administered 2023-03-08: 3 [IU] via SUBCUTANEOUS

## 2023-03-07 MED ORDER — TRAZODONE HCL 50 MG PO TABS
50.0000 mg | ORAL_TABLET | Freq: Every day | ORAL | Status: DC
  Administered 2023-03-07: 50 mg via ORAL
  Filled 2023-03-07: qty 1

## 2023-03-07 MED ORDER — INSULIN ASPART 100 UNIT/ML IJ SOLN
0.0000 [IU] | Freq: Every day | INTRAMUSCULAR | Status: DC
  Administered 2023-03-08: 2 [IU] via SUBCUTANEOUS

## 2023-03-07 MED ORDER — POTASSIUM CHLORIDE CRYS ER 20 MEQ PO TBCR
20.0000 meq | EXTENDED_RELEASE_TABLET | Freq: Once | ORAL | Status: AC
  Administered 2023-03-07: 20 meq via ORAL
  Filled 2023-03-07: qty 1

## 2023-03-07 MED ORDER — HYDROXYZINE HCL 25 MG PO TABS
25.0000 mg | ORAL_TABLET | Freq: Four times a day (QID) | ORAL | Status: DC | PRN
  Administered 2023-03-07 – 2023-03-14 (×15): 25 mg via ORAL
  Filled 2023-03-07 (×15): qty 1

## 2023-03-07 MED ORDER — MELATONIN 5 MG PO TABS
5.0000 mg | ORAL_TABLET | Freq: Once | ORAL | Status: AC
  Administered 2023-03-07: 5 mg via ORAL
  Filled 2023-03-07: qty 1

## 2023-03-07 MED ORDER — MAGNESIUM SULFATE IN D5W 1-5 GM/100ML-% IV SOLN
1.0000 g | Freq: Once | INTRAVENOUS | Status: AC
  Administered 2023-03-07: 1 g via INTRAVENOUS
  Filled 2023-03-07: qty 100

## 2023-03-07 NOTE — TOC Initial Note (Addendum)
Transition of Care [facility]) - Initial/Assessment Note    Patient Details  Name: [name] MRN: [id number] Date of Birth: February 11, 1930  [facility] CM/SW Contact:    Elliot Cousin, RN Phone Number: [phone] 03/07/2023, 1:03 PM  Clinical Narrative:                 CM spoke to pt and states she may want to change [facility] agency. Pt is active with [facility] wirh PT/RN. States she will discuss with pt and family. Pt does not have any DME in the home. Waiting for PT/OT recommendations. Will need HH orders with [facility].   Expected Discharge Plan: Home w Home Health Services Barriers to Discharge: Continued Medical Work up   Patient Goals and CMS Choice Patient states their goals for this hospitalization and ongoing recovery are:: wants to recover fully CMS Medicare.gov Compare Post Acute Care list provided to::  Langley Gauss) Choice offered to / list presented to : Spouse      Expected Discharge Plan and Services   Discharge Planning Services: CM Consult Post Acute Care Choice: Home Health Living arrangements for the past 2 months: Single Family Home                                      Prior Living Arrangements/Services Living arrangements for the past 2 months: Single Family Home Lives with:: Spouse Patient language and need for interpreter reviewed:: Yes Do you feel safe going back to the place where you live?: Yes      Need for Family Participation in Patient Care: No (Comment) Care giver support system in place?: Yes (comment)   Criminal Activity/Legal Involvement Pertinent to Current Situation/Hospitalization: No - Comment as needed  Activities of Daily Living      Permission Sought/Granted Permission sought to share information with : Case Manager, Family Supports, PCP    Share Information with NAME: Amias Zur     Permission granted to share info w Relationship: wife  Permission granted to share info w Contact Information:  [phone]  Emotional Assessment Appearance:: Appears stated age Attitude/Demeanor/Rapport: Engaged Affect (typically observed): Accepting Orientation: : Oriented to Self, Oriented to Place, Oriented to  Time, Oriented to Situation   Psych Involvement: No (comment)  Admission diagnosis:  Acute pulmonary embolism [I26.99] Other acute pulmonary embolism, unspecified whether acute cor pulmonale present [I26.99] Pulmonary embolism [I26.99] Patient Active Problem List   Diagnosis Date Noted   Pulmonary embolism 03/06/2023   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   Aortitis 03/06/2023   Atrial flutter 03/06/2023   FUO (fever of unknown origin) 03/06/2023   S/P insertion of endovascular thoracic aortic stent graft 03/06/2023   Acute pulmonary embolism 03/05/2023   Acute hypoxic respiratory failure 03/05/2023   Acute hyponatremia 03/05/2023   Elevated troponin 03/05/2023   Lactic acidosis 03/05/2023   HCAP (healthcare-associated pneumonia) 03/05/2023   Chronic diastolic CHF (congestive heart failure) 03/05/2023   BPH (benign prostatic hyperplasia) 03/05/2023   Fever 01/24/2023   S/P AAA repair 08/16/2017   Generalized weakness 04/03/2017   Abdominal pain, RUQ 03/28/2017   Sinusitis 03/14/2017   Skin tear of hand without complication, initial encounter 01/11/2017   Abrasion, multiple sites 01/11/2017   Mass of left hand 10/19/2016   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   AAA (abdominal aortic aneurysm) without rupture (HCC) 01/26/2016   Dupuytren's contracture of left hand 08/26/2014   Contracture  of joint of finger of left hand 08/26/2014   Branch retinal vein occlusion 05/27/2014   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   Special screening for malignant neoplasm of prostate 02/18/2014   Urinary frequency 02/18/2014   Incomplete bladder emptying 02/18/2014   AAA (abdominal aortic aneurysm) 01/07/2014   Hyperlipidemia 08/20/2013   PMR (polymyalgia rheumatica) 08/20/2013   Unspecified  vitamin D deficiency 08/20/2013   Impotence of organic origin 08/20/2013   Floater, vitreous 10/19/2012   Cerebrovascular disease 09/28/2012   Cataract, nuclear 03/14/2012   Corneal macula not interfering with central vision 03/14/2012   COLONIC POLYPS, HX OF 03/16/2010   PCP:  [name], MD Pharmacy:   [facility] - [place], [place] - [address] [place] [address] Phone: [phone] Fax: [phone]     Social Determinants of Health (SDOH) Social History: SDOH Screenings   Food Insecurity: No Food Insecurity (01/25/2023)  Housing: Low Risk  (01/25/2023)  Transportation Needs: No Transportation Needs (01/25/2023)  Utilities: Not At Risk (01/25/2023)  Tobacco Use: Low Risk  (03/05/2023)   SDOH Interventions:     Readmission Risk Interventions     No data to display

## 2023-03-07 NOTE — Evaluation (Signed)
Physical Therapy Evaluation Patient Details Name: [name] S Stanis MRN: [id number] DOB: May 17, 1930 Today's Date: 03/07/2023  History of Present Illness  89 yr old male  was [facility] with [facility] and initially complaining of generalized weakness on 4/7. Patient developed SVT on the morning of 4/8 along with respiratory distress and hypoxia requiring non-re breather mask. WUJ:[id number]:polymyalgia rheumatica on chronic steroids, recent hospitalization for aortitis, CHF, chronic leukocytosis, AAA with S/P stent graft in 2018, HLD, and DDD  Clinical Impression  PTA pt living with wife in multistory home with 1 step to enter and master bed and bath on the first floor. Pt was independent, working as Teacher, English as a foreign languageCEO of the [employer], and exercising regularly. Pt is currently limited in safe mobility by increased O2 demand on HHF O2, in presence of decreased balance and endurance. Pt requires min A for bed mobility, modA for transfers and stepping along EoB. Pt able to maintain SpO2 >90%O2 throughout session. PT will work to progress pt to be able to eventually return home.        Recommendations for follow up therapy are one component of a multi-disciplinary discharge planning process, led by the attending physician.  Recommendations may be updated based on patient status, additional functional criteria and insurance authorization.     Assistance Recommended at Discharge Frequent or constant Supervision/Assistance  Patient can return home with the following  A little help with walking and/or transfers;A little help with bathing/dressing/bathroom;Assistance with cooking/housework;Direct supervision/assist for medications management;Direct supervision/assist for financial management;Assist for transportation;Help with stairs or ramp for entrance    Equipment Recommendations Other (comment) (TBD)  Recommendations for Other Services       Functional Status Assessment Patient has had a recent decline in their  functional status and demonstrates the ability to make significant improvements in function in a reasonable and predictable amount of time.     Precautions / Restrictions Precautions Precautions: Fall Restrictions Weight Bearing Restrictions: No      Mobility  Bed Mobility Overal bed mobility: Needs Assistance Bed Mobility: Supine to Sit, Sit to Supine     Supine to sit: Min assist, HOB elevated Sit to supine: Min guard   General bed mobility comments: pt able to walk LE to EoB requires cuing for use of rails and ultimately pulls against therapist to come to EoB, min guard for return of LE to bed, able to bridge hips to the center of the bed    Transfers Overall transfer level: Needs assistance Equipment used: 1 person hand held assist Transfers: Sit to/from Stand, Bed to chair/wheelchair/BSC Sit to Stand: Mod assist, Min assist           General transfer comment: min A for power up and modA for steadying while standing EoB, min A for lateral stepping towards EoB    Ambulation/Gait               General Gait Details: deferred due to pt "not getting any sleep last night"      Balance Overall balance assessment: Needs assistance Sitting-balance support: Feet supported, No upper extremity supported Sitting balance-Leahy Scale: Good Sitting balance - Comments: able to sit EoB and brush teeth, reaching outside of his BoS   Standing balance support: During functional activity, Bilateral upper extremity supported Standing balance-Leahy Scale: Poor Standing balance comment: requires outside assist of therapist to maintain balance with dynamic activity  Pertinent Vitals/Pain Pain Assessment Pain Assessment: No/denies pain    Home Living Family/patient expects to be discharged to:: Private residence Living Arrangements: Spouse/significant other Available Help at Discharge: Family;Available 24 hours/day Type of Home:  House Home Access: Stairs to enter   Entrance Stairs-Number of Steps: 1 Alternate Level Stairs-Number of Steps: 12 Home Layout: Multi-level;Able to live on main level with bedroom/bathroom        Prior Function Prior Level of Function : Independent/Modified Independent             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[employer], works daily, Sales executive and exercises ADLs Comments: independent        Extremity/Trunk Assessment   Upper Extremity Assessment Upper Extremity Assessment: Defer to OT evaluation    Lower Extremity Assessment Lower Extremity Assessment: Overall WFL for tasks assessed       Communication   Communication: No difficulties  Cognition Arousal/Alertness: Awake/alert Behavior During Therapy: WFL for tasks assessed/performed, Flat affect Overall Cognitive Status: Within Functional Limits for tasks assessed                                 General Comments: a little self limiting in setting of drop in O2 last time he got out of bed        General Comments General comments (skin integrity, edema, etc.): Pt on 45L O2 via HHF 90%O2, pt able to maintain SpO2 >90%O2 throughout mobility, and SpO2 improved to 97%O2 with sitting EoB, wife and son present during session    Exercises General Exercises - Lower Extremity Long Arc Quad: AROM, Both, 10 reps, Seated   Assessment/Plan    PT Assessment Patient needs continued PT services  PT Problem List Decreased activity tolerance;Decreased balance;Decreased mobility;Cardiopulmonary status limiting activity       PT Treatment Interventions DME instruction;Gait training;Stair training;Functional mobility training;Therapeutic activities;Therapeutic exercise;Balance training;Cognitive remediation;Patient/family education    PT Goals (Current goals can be found in the Care Plan section)  Acute Rehab PT Goals Patient Stated Goal: get back to work PT Goal Formulation: With patient/family Time For Goal  Achievement: 03/21/23 Potential to Achieve Goals: Good    Frequency Min 1X/week        AM-PAC PT "6 Clicks" Mobility  Outcome Measure Help needed turning from your back to your side while in a flat bed without using bedrails?: None Help needed moving from lying on your back to sitting on the side of a flat bed without using bedrails?: A Little Help needed moving to and from a bed to a chair (including a wheelchair)?: A Little Help needed standing up from a chair using your arms (e.g., wheelchair or bedside chair)?: A Lot Help needed to walk in hospital room?: A Little Help needed climbing 3-5 steps with a railing? : A Lot 6 Click Score: 17    End of Session Equipment Utilized During Treatment: Gait belt;Oxygen Activity Tolerance: Patient tolerated treatment well Patient left: in bed;with call bell/phone within reach;with bed alarm set;with family/visitor present Nurse Communication: Mobility status (need to be out of bed with OT this afternoon) PT Visit Diagnosis: Difficulty in walking, not elsewhere classified (R26.2);Other abnormalities of gait and mobility (R26.89);Unsteadiness on feet (R26.81)    Time: 2119-4174 PT Time Calculation (min) (ACUTE ONLY): 26 min   Charges:   PT Evaluation $PT Eval Moderate Complexity: 1 Mod PT Treatments $Therapeutic Activity: 8-22 mins        Elon Alas  Fleet PT, DPT Acute Rehabilitation Services Please use secure chat or  Call Office [phone]   Elon Alas [facility] 03/07/2023, 10:57 AM

## 2023-03-07 NOTE — Progress Notes (Signed)
eLink Physician-Brief Progress Note Patient Name: [name] DOB: August 14, 1930 MRN: [id number]   Date of Service  03/07/2023  HPI/Events of Note  Having insomnia, melatonin ordered  eICU Interventions  One time melatonin ordered     Intervention Category Minor Interventions: Routine modifications to care plan (e.g. PRN medications for pain, fever)  Elwyn Lowden 03/07/2023, 1:08 AM

## 2023-03-07 NOTE — Progress Notes (Signed)
[facility]                                                                               Yaser Harvill, is a 89 y.o. male, DOB - [birth year], WUJ:[id number] Admit date - 03/05/2023    Outpatient Primary MD for the patient is [name], MD  LOS - 1  days    Brief summary   ARIES TOWNLEY is a 89 y.o. male with medical history significant for polymyalgia rheumatica on chronic prednisone therapy, AAA status post endovascular stent graft in September 2018, chronic diastolic heart failure, recent diagnosis of aortitis, chronic leukocytosis,is admitted to [facility] on 03/05/2023 with acute pulmonary embolism after presenting from home to [facility] ED complaining of generalized weakness.   Patient was recently hospitalized  at [facility] in for generalized weakness, was found to have aortitis of unclear etiology and discharged home IV antibiotics including Daptomycin, rocephin and Bactrim. However, he developed worsening weakness, with right shoulder pain. Rpt imaging of the chest shows  Acute pulmonary embolism arising at the distal right main pulmonary artery and extending into right middle and lower pulmonary arteries. Pulmonary parenchymal findings compatible with multifocal pneumonia superimposed on a background of interstitial lung disease.  PCCM consulted for increased oxygen requirement on 4/8, transferred to ICU and placed on HF [place] oxygen.he is also being worked up for auto immune process and started on high dose corticosteroids for potential eosinophilic pneumonia from  daptomycin/autoimmune pathology/COP/BOOP   Assessment & Plan    Assessment and Plan:   Acute respiratory failure with hypoxia secondary to acute pulmonary embolism on the right, multifocal pneumonia , interstitial lung disease.  He was initially on 5 to 6 lit/[place] , later on his oxygen requirement increased, PCCM consulted and he was transferred to ICU for closer monitoring.  He was initially started on IV  heparin but transitioned to lovenox, as therapeutic levels couldn't be achieved.  IV steroids were added by PCCM. Echocardiogram ordered to check for right heart strain.There is  no evidence of cor pulmonale.  Venous duplex of the lower extremity show acute deep vein thrombosis involving the right peroneal veins . Venous duplex of upper extremity did not show any DVT.     Multifocal pneumonia  Differential include daptomycin eosinophilic pneumonia vs interstitial pneumonia Started on IV vancomycin( daptomycin discontinued), cefepime and Bactrim.  Auto immune work up ordered and pending.  Lactic acid normalized.  ID consulted for recommendations on the duration of antibiotics.    Interstitial Lung Disease evident on CT: ?New diagnosis. CK is 204, LDH 286, CRP is 29, Pro calcitonin of 1.82, ferritin is 201, sed rate  138, MRSA is negative,further auto immune work up pending.  ESR 89, CRP 75.    AAA S/P stent , graft repair:embolization of  L5 lumbar and sacral arteries. CTA of the chest  Unchanged aneurysmal dilation of the ascending, transverse, and descending thoracic aorta with maximum diameter of 51 mm in the proximal descending aorta, unchanged from the study in 11/2022.   Atrial flutter: in the setting of PE and multifocal pneumonia.  Converted to NSR with amiodarone gtt and metoprolol. Amiodarone stopped.  TSH wnl.  PMR:  Was on chronic steroids, currently on  high dose steroids.  Patient reports pain in the right shoulder, ? Myositis vs PMR flare.  If it continues will probably need to stop the statin.    GERD; Stable on PPI.    Chronic leukocytosis:  From  steroids.    Anemia of chronic disease:  Hemoglobin around 10. Monitor.    Hyponatremia:  From SIADH from lung disease.  Hyperglycemia  On SSI.  A1c is 6.3%   Anxiety;  Increased the frequency of atarax to every 6 hours prn.    Estimated body mass index is 23.92 kg/m as calculated from the  following:   Height as of this encounter: 6' (1.829 m).   Weight as of this encounter: 80 kg.  Code Status: Full code.  DVT Prophylaxis:  Lovenox.    Level of Care: Level of care: ICU Family Communication: discussed with wife at bedside.   Disposition Plan:     Remains inpatient appropriate:  IV antibiotics. Further eval of acute respiratory failure with hypoxia.   Procedures:  Echocardiogram.   Consultants:   Cardiology ID.  PCCM.  Antimicrobials:   Anti-infectives (From admission, onward)    Start     Dose/Rate Route Frequency Ordered Stop   03/06/23 1715  vancomycin (VANCOREADY) IVPB 1500 mg/300 mL  Status:  Discontinued        1,500 mg 150 mL/hr over 120 Minutes Intravenous  Once 03/06/23 1628 03/06/23 1628   03/06/23 1628  vancomycin (VANCOREADY) IVPB 1250 mg/250 mL        1,250 mg 166.7 mL/hr over 90 Minutes Intravenous Every 24 hours 03/06/23 1628     03/06/23 0900  sulfamethoxazole-trimethoprim (BACTRIM DS) 800-160 MG per tablet 1 tablet        1 tablet Oral Once per day on Mon Wed Fri 03/05/23 2141     03/06/23 0800  ceFEPIme (MAXIPIME) 2 g in sodium chloride 0.9 % 100 mL IVPB        2 g 200 mL/hr over 30 Minutes Intravenous Every 12 hours 03/05/23 2140     03/05/23 1845  DAPTOmycin (CUBICIN) 600 mg in sodium chloride 0.9 % IVPB  Status:  Discontinued        600 mg 124 mL/hr over 30 Minutes Intravenous Daily 03/05/23 1833 03/06/23 1546   03/05/23 1645  ceFEPIme (MAXIPIME) 2 g in sodium chloride 0.9 % 100 mL IVPB        2 g 200 mL/hr over 30 Minutes Intravenous  Once 03/05/23 1639 03/05/23 2018   03/05/23 1645  vancomycin (VANCOREADY) IVPB 1500 mg/300 mL  Status:  Discontinued        1,500 mg 150 mL/hr over 120 Minutes Intravenous  Once 03/05/23 1639 03/05/23 1645        Medications  Scheduled Meds:  Chlorhexidine Gluconate Cloth  6 each Topical Daily   enoxaparin (LOVENOX) injection  80 mg Subcutaneous Q12H   fluorometholone  1 drop Right Eye QHS    folic acid  1 mg Oral Daily   insulin aspart  0-5 Units Subcutaneous QHS   insulin aspart  0-9 Units Subcutaneous TID WC   methylPREDNISolone (SOLU-MEDROL) injection  125 mg Intravenous Q6H   pantoprazole  40 mg Oral Daily   rosuvastatin  5 mg Oral QHS   sodium chloride flush  10-40 mL Intracatheter Q12H   sulfamethoxazole-trimethoprim  1 tablet Oral Once per day on Mon Wed Fri   traZODone  50 mg Oral QHS  Continuous Infusions:  amiodarone 30 mg/hr (03/07/23 1800)   ceFEPime (MAXIPIME) IV Stopped (03/07/23 0840)   vancomycin Stopped (03/07/23 1758)   PRN Meds:.acetaminophen **OR** acetaminophen, hydrOXYzine, lidocaine, melatonin, ondansetron (ZOFRAN) IV, sodium chloride flush    Subjective:   Maanav Blinn was seen and examined today.  Anxious , breathing better. No chest pain .    Objective:   Vitals:   03/07/23 1700 03/07/23 1800 03/07/23 1900 03/07/23 1955  BP: 121/66 (!) 153/77 138/78   Pulse: 76 77 72 74  Resp: (!) 26 (!) 37 (!) 26 (!) 24  Temp:      TempSrc:      SpO2: 96% 99% 96% 95%  Weight:      Height:        Intake/Output Summary (Last 24 hours) at 03/07/2023 2006 Last data filed at 03/07/2023 1800 Gross per 24 hour  Intake 928.69 ml  Output 1710 ml  Net -781.31 ml    Filed Weights   03/05/23 2200 03/06/23 0624 03/07/23 0440  Weight: 77.1 kg 77.1 kg 80 kg     Exam General exam: elderly gentleman, not in distress.  Respiratory system: diminished at bases. On HF [place] oxygen. Tachypnea.  Cardiovascular system: S1 & S2 heard, RRR. No JVD, Gastrointestinal system: Abdomen is nondistended, soft and nontender. Central nervous system: Alert and oriented. No focal neurological deficits. Extremities: no pedal edema.  Skin: No rashes,  Psychiatry: anxious.     Data Reviewed:  I have personally reviewed following labs and imaging studies   CBC Lab Results  Component Value Date   WBC 26.1 (H) 03/07/2023   RBC 3.39 (L) 03/07/2023   HGB 10.7 (L)  03/07/2023   HCT 32.4 (L) 03/07/2023   MCV 95.6 03/07/2023   MCH 31.6 03/07/2023   PLT 325 03/07/2023   MCHC 33.0 03/07/2023   RDW 16.0 (H) 03/07/2023   LYMPHSABS 0.5 (L) 03/07/2023   MONOABS 0.3 03/07/2023   EOSABS 0.0 03/07/2023   BASOSABS 0.0 03/07/2023     Last metabolic panel Lab Results  Component Value Date   NA 130 (L) 03/07/2023   K 3.9 03/07/2023   CL 100 03/07/2023   CO2 21 (L) 03/07/2023   BUN 14 03/07/2023   CREATININE 1.04 03/07/2023   GLUCOSE 187 (H) 03/07/2023   GFRNONAA >60 03/07/2023   GFRAA >60 01/24/2018   CALCIUM 7.5 (L) 03/07/2023   PHOS 3.0 03/06/2023   PROT 5.9 (L) 03/07/2023   ALBUMIN 1.9 (L) 03/07/2023   LABGLOB 2.5 03/21/2016   AGRATIO 1.6 03/21/2016   BILITOT 0.3 03/07/2023   ALKPHOS 81 03/07/2023   AST 49 (H) 03/07/2023   ALT 38 03/07/2023   ANIONGAP 9 03/07/2023    CBG (last 3)  Recent Labs    03/06/23 2029 03/07/23 1114 03/07/23 1552  GLUCAP 171* 230* 185*       Coagulation Profile: No results for input(s): "INR", "PROTIME" in the last 168 hours.   Radiology Studies: VAS [place] UPPER EXTREMITY VENOUS DUPLEX  Result Date: 03/07/2023 UPPER VENOUS STUDY  Patient Name:  ESTEVAN SHEELER [facility]  Date of Exam:   03/07/2023 Medical Rec #: [id number]       Accession #:    [id number] Date of Birth: October 02, 1930      Patient Gender: M Patient Age:   17 years Exam Location:  [facility] Procedure:      VAS [place] UPPER EXTREMITY VENOUS DUPLEX Referring Phys: CORNELIUS VAN DAM --------------------------------------------------------------------------------  Indications: pulmonary embolism Anticoagulation: Heparin. Limitations:  IV placement, bandages. Comparison Study: No prior study. Performing Technologist: McKayla Maag RVT, VT  Examination Guidelines: A complete evaluation includes B-mode imaging, spectral Doppler, color Doppler, and power Doppler as needed of all accessible portions of each vessel. Bilateral testing is considered an integral part of a  complete examination. Limited examinations for reoccurring indications may be performed as noted.  Right Findings: +----------+------------+---------+-----------+----------+-------+ RIGHT     CompressiblePhasicitySpontaneousPropertiesSummary +----------+------------+---------+-----------+----------+-------+ IJV           Full       Yes       Yes                      +----------+------------+---------+-----------+----------+-------+ Subclavian    Full       Yes       Yes                      +----------+------------+---------+-----------+----------+-------+ Axillary      Full       Yes       Yes                      +----------+------------+---------+-----------+----------+-------+ Brachial      Full       Yes       Yes                      +----------+------------+---------+-----------+----------+-------+ Radial        Full                                          +----------+------------+---------+-----------+----------+-------+ Ulnar         Full                                          +----------+------------+---------+-----------+----------+-------+ Cephalic      Full                                          +----------+------------+---------+-----------+----------+-------+ Basilic       Full                                          +----------+------------+---------+-----------+----------+-------+  Left Findings: +----------+------------+---------+-----------+----------+-------+ LEFT      CompressiblePhasicitySpontaneousPropertiesSummary +----------+------------+---------+-----------+----------+-------+ IJV           Full       Yes       Yes                      +----------+------------+---------+-----------+----------+-------+ Subclavian    Full       Yes       Yes                      +----------+------------+---------+-----------+----------+-------+ Axillary      Full       Yes       Yes                       +----------+------------+---------+-----------+----------+-------+ Brachial  Full       Yes       Yes                      +----------+------------+---------+-----------+----------+-------+ Radial        Full                                          +----------+------------+---------+-----------+----------+-------+ Ulnar         Full                                          +----------+------------+---------+-----------+----------+-------+ Cephalic      Full                                          +----------+------------+---------+-----------+----------+-------+ Basilic       Full                                          +----------+------------+---------+-----------+----------+-------+  Summary: No evidence of deep vein or superficial vein thrombosis involving the right and left upper extremities. Right: However, unable to visualize segments of the brachial veins due to picc line and bandage.  Left: However, unable to visualize segments of the radial and ulnar veins due to IV placement.  *See table(s) above for measurements and observations.  Diagnosing physician: Waverly Ferrari MD Electronically signed by Waverly Ferrari MD on 03/07/2023 at 6:17:24 PM.    Final    VAS [place] LOWER EXTREMITY VENOUS (DVT)  Result Date: 03/07/2023  Lower Venous DVT Study Patient Name:  KALVIN BUSS [facility]  Date of Exam:   03/07/2023 Medical Rec #: [id number]       Accession #:    [id number] Date of Birth: 1930/10/01      Patient Gender: M Patient Age:   52 years Exam Location:  [facility] Procedure:      VAS [place] LOWER EXTREMITY VENOUS (DVT) Referring Phys: CORNELIUS VAN DAM --------------------------------------------------------------------------------  Indications: Pulmonary embolism.  Anticoagulation: Heparin. Comparison Study: No prior study. Performing Technologist: McKayla Maag RVT, VT  Examination Guidelines: A complete evaluation includes B-mode imaging, spectral Doppler, color  Doppler, and power Doppler as needed of all accessible portions of each vessel. Bilateral testing is considered an integral part of a complete examination. Limited examinations for reoccurring indications may be performed as noted. The reflux portion of the exam is performed with the patient in reverse Trendelenburg.  +---------+---------------+---------+-----------+----------+--------------+ RIGHT    CompressibilityPhasicitySpontaneityPropertiesThrombus Aging +---------+---------------+---------+-----------+----------+--------------+ CFV      Full           Yes      Yes                                 +---------+---------------+---------+-----------+----------+--------------+ SFJ      Full                                                        +---------+---------------+---------+-----------+----------+--------------+  FV Prox  Full                                                        +---------+---------------+---------+-----------+----------+--------------+ FV Mid   Full                                                        +---------+---------------+---------+-----------+----------+--------------+ FV DistalFull                                                        +---------+---------------+---------+-----------+----------+--------------+ PFV      Full                                                        +---------+---------------+---------+-----------+----------+--------------+ POP      Full           Yes      Yes                                 +---------+---------------+---------+-----------+----------+--------------+ PTV      Full                                                        +---------+---------------+---------+-----------+----------+--------------+ PERO     Partial        Yes      Yes                  Acute          +---------+---------------+---------+-----------+----------+--------------+    +---------+---------------+---------+-----------+----------+--------------+ LEFT     CompressibilityPhasicitySpontaneityPropertiesThrombus Aging +---------+---------------+---------+-----------+----------+--------------+ CFV      Full           Yes      Yes                                 +---------+---------------+---------+-----------+----------+--------------+ SFJ      Full                                                        +---------+---------------+---------+-----------+----------+--------------+ FV Prox  Full                                                        +---------+---------------+---------+-----------+----------+--------------+  FV Mid   Full                                                        +---------+---------------+---------+-----------+----------+--------------+ FV DistalFull                                                        +---------+---------------+---------+-----------+----------+--------------+ PFV      Full                                                        +---------+---------------+---------+-----------+----------+--------------+ POP      Full           Yes      Yes                                 +---------+---------------+---------+-----------+----------+--------------+ PTV      Full                                                        +---------+---------------+---------+-----------+----------+--------------+ PERO     Full                                                        +---------+---------------+---------+-----------+----------+--------------+     Summary: RIGHT: - Findings consistent with acute deep vein thrombosis involving the right peroneal veins. - No cystic structure found in the popliteal fossa.  LEFT: - There is no evidence of deep vein thrombosis in the lower extremity.  - No cystic structure found in the popliteal fossa.  *See table(s) above for measurements and observations.  Electronically signed by Waverly Ferrari MD on 03/07/2023 at 6:16:33 PM.    Final    DG CHEST PORT 1 VIEW  Result Date: 03/07/2023 CLINICAL DATA:  Dyspnea. EXAM: PORTABLE CHEST 1 VIEW COMPARISON:  03/05/2023 FINDINGS: The cardio pericardial silhouette is enlarged. Diffuse interstitial opacity again noted with progression of peripheral patchy areas of airspace opacity bilaterally. No pleural effusion. Right PICC line tip overlies the SVC/RA junction. Telemetry leads overlie the chest. Peripheral IMPRESSION: Diffuse interstitial opacity with progression of peripheral patchy areas of airspace opacity bilaterally suggesting multifocal pneumonia. Electronically Signed   By: Kennith Center M.D.   On: 03/07/2023 08:16   ECHOCARDIOGRAM COMPLETE  Result Date: 03/06/2023    ECHOCARDIOGRAM REPORT   Patient Name:   DENSON JULIAN [facility] Date of Exam: 03/06/2023 Medical Rec #:  [id number]      Height:       72.0 in Accession #:    [id number]     Weight:       170.0 lb Date  of Birth:  [birth year]     BSA:          1.988 m Patient Age:    89 years       BP:           140/80 mmHg Patient Gender: M              HR:           91 bpm. Exam Location:  Inpatient Procedure: 2D Echo, Cardiac Doppler and Color Doppler Indications:    I26.09 Pulmonary embolus  History:        Patient has prior history of Echocardiogram examinations, most                 recent 06/08/2022. CHF, AAA; Risk Factors:Dyslipidemia,                 Hypertension and Non-Smoker.  Sonographer:    Dondra Prader RVT RCS Referring Phys: [id number] Angie Fava  Sonographer Comments: Technically challenging study due to limited acoustic windows, Technically difficult study due to poor echo windows, suboptimal parasternal window and suboptimal apical window. Image acquisition challenging due to uncooperative patient. Patient could not tolerate exam; Patient would not lie still. IMPRESSIONS  1. Abnormal septal motion . Left ventricular ejection fraction, by estimation, is 55 to  60%. The left ventricle has normal function. The left ventricle has no regional wall motion abnormalities. Left ventricular diastolic parameters were normal.  2. No evidence of cor pulmonale in setting of recent PE . Right ventricular systolic function is normal. The right ventricular size is normal.  3. The mitral valve is normal in structure. No evidence of mitral valve regurgitation. No evidence of mitral stenosis.  4. Mean gradient across valve is 10 mmHg peak 16.8 mmHg but AVA 2.1 cm2 Similar AV sclerosis on TTE done at [facility] Some shadowing artifact in LVOT Did not think any vegetatiions /SBE present has had recent negative tagged WBC scan at [facility] as well with negative BC;s . The aortic valve is tricuspid. There is moderate calcification of the aortic valve. There is moderate thickening of the aortic valve. Aortic valve regurgitation is not visualized. Aortic valve sclerosis/calcification is present, without any evidence of aortic stenosis.  5. The inferior vena cava is normal in size with greater than 50% respiratory variability, suggesting right atrial pressure of 3 mmHg. FINDINGS  Left Ventricle: Abnormal septal motion. Left ventricular ejection fraction, by estimation, is 55 to 60%. The left ventricle has normal function. The left ventricle has no regional wall motion abnormalities. The left ventricular internal cavity size was normal in size. There is no left ventricular hypertrophy. Left ventricular diastolic parameters were normal. Right Ventricle: No evidence of cor pulmonale in setting of recent PE. The right ventricular size is normal. No increase in right ventricular wall thickness. Right ventricular systolic function is normal. Left Atrium: Left atrial size was normal in size. Right Atrium: Right atrial size was normal in size. Pericardium: There is no evidence of pericardial effusion. Mitral Valve: The mitral valve is normal in structure. No evidence of mitral valve regurgitation. No evidence of  mitral valve stenosis. Tricuspid Valve: The tricuspid valve is normal in structure. Tricuspid valve regurgitation is trivial. No evidence of tricuspid stenosis. Aortic Valve: Mean gradient across valve is 10 mmHg peak 16.8 mmHg but AVA 2.1 cm2 Similar AV sclerosis on TTE done at [facility] Some shadowing artifact in LVOT Did not think any vegetatiions /SBE present has had recent negative tagged WBC  scan at [facility] as well with negative BC;s. The aortic valve is tricuspid. There is moderate calcification of the aortic valve. There is moderate thickening of the aortic valve. Aortic valve regurgitation is not visualized. Aortic valve sclerosis/calcification is present, without any evidence of aortic stenosis. Aortic valve mean gradient measures 10.0 mmHg. Aortic valve peak gradient measures 16.8 mmHg. Aortic valve area, by VTI measures 2.25 cm. Pulmonic Valve: The pulmonic valve was normal in structure. Pulmonic valve regurgitation is not visualized. No evidence of pulmonic stenosis. Aorta: The aortic root is normal in size and structure. Venous: The inferior vena cava is normal in size with greater than 50% respiratory variability, suggesting right atrial pressure of 3 mmHg. IAS/Shunts: No atrial level shunt detected by color flow Doppler.  LEFT VENTRICLE PLAX 2D LVIDd:         3.90 cm   Diastology LVIDs:         2.60 cm   LV e' medial:    6.45 cm/s LV PW:         1.30 cm   LV E/e' medial:  12.6 LV IVS:        0.80 cm   LV e' lateral:   8.03 cm/s LVOT diam:     2.10 cm   LV E/e' lateral: 10.1 LV SV:         79 LV SV Index:   40 LVOT Area:     3.46 cm  RIGHT VENTRICLE             IVC RV Basal diam:  3.90 cm     IVC diam: 2.20 cm RV S prime:     11.30 cm/s TAPSE (M-mode): 2.3 cm LEFT ATRIUM           Index        RIGHT ATRIUM           Index LA diam:      2.90 cm 1.46 cm/m   RA Area:     13.00 cm LA Vol (A2C): 35.6 ml 17.90 ml/m  RA Volume:   29.60 ml  14.89 ml/m LA Vol (A4C): 26.3 ml 13.23 ml/m  AORTIC VALVE                      PULMONIC VALVE AV Area (Vmax):    2.21 cm      PV Vmax:       0.71 m/s AV Area (Vmean):   2.14 cm      PV Peak grad:  2.0 mmHg AV Area (VTI):     2.25 cm AV Vmax:           205.00 cm/s AV Vmean:          151.000 cm/s AV VTI:            0.349 m AV Peak Grad:      16.8 mmHg AV Mean Grad:      10.0 mmHg LVOT Vmax:         131.00 cm/s LVOT Vmean:        93.300 cm/s LVOT VTI:          0.227 m LVOT/AV VTI ratio: 0.65  AORTA Ao Root diam: 3.60 cm Ao Asc diam:  3.50 cm Ao Arch diam: 3.4 cm MITRAL VALVE               TRICUSPID VALVE MV Area (PHT): 3.24 cm    TR Peak grad:   31.6 mmHg MV Decel  Time: 234 msec    TR Vmax:        281.00 cm/s MV E velocity: 81.50 cm/s MV A velocity: 82.60 cm/s  SHUNTS MV E/A ratio:  0.99        Systemic VTI:  0.23 m                            Systemic Diam: 2.10 cm Charlton Haws MD Electronically signed by Charlton Haws MD Signature Date/Time: 03/06/2023/4:39:06 PM    Final        Kathlen Mody M.D. [facility] 03/07/2023, 8:06 PM  Available via Epic secure chat 7am-7pm After 7 pm, please refer to night coverage provider listed on amion.

## 2023-03-07 NOTE — Progress Notes (Signed)
Subjective: Patient c/o steroids making him wired, insomnia, anxiety   Antibiotics:  Anti-infectives (From admission, onward)    Start     Dose/Rate Route Frequency Ordered Stop   03/06/23 1715  vancomycin (VANCOREADY) IVPB 1500 mg/300 mL  Status:  Discontinued        1,500 mg 150 mL/hr over 120 Minutes Intravenous  Once 03/06/23 1628 03/06/23 1628   03/06/23 1628  vancomycin (VANCOREADY) IVPB 1250 mg/250 mL        1,250 mg 166.7 mL/hr over 90 Minutes Intravenous Every 24 hours 03/06/23 1628     03/06/23 0900  sulfamethoxazole-trimethoprim (BACTRIM DS) 800-160 MG per tablet 1 tablet        1 tablet Oral Once per day on Mon Wed Fri 03/05/23 2141     03/06/23 0800  ceFEPIme (MAXIPIME) 2 g in sodium chloride 0.9 % 100 mL IVPB        2 g 200 mL/hr over 30 Minutes Intravenous Every 12 hours 03/05/23 2140     03/05/23 1845  DAPTOmycin (CUBICIN) 600 mg in sodium chloride 0.9 % IVPB  Status:  Discontinued        600 mg 124 mL/hr over 30 Minutes Intravenous Daily 03/05/23 1833 03/06/23 1546   03/05/23 1645  ceFEPIme (MAXIPIME) 2 g in sodium chloride 0.9 % 100 mL IVPB        2 g 200 mL/hr over 30 Minutes Intravenous  Once 03/05/23 1639 03/05/23 2018   03/05/23 1645  vancomycin (VANCOREADY) IVPB 1500 mg/300 mL  Status:  Discontinued        1,500 mg 150 mL/hr over 120 Minutes Intravenous  Once 03/05/23 1639 03/05/23 1645       Medications: Scheduled Meds:  Chlorhexidine Gluconate Cloth  6 each Topical Daily   enoxaparin (LOVENOX) injection  80 mg Subcutaneous Q12H   fluorometholone  1 drop Right Eye QHS   folic acid  1 mg Oral Daily   insulin aspart  0-5 Units Subcutaneous QHS   insulin aspart  0-9 Units Subcutaneous TID WC   methylPREDNISolone (SOLU-MEDROL) injection  125 mg Intravenous Q6H   pantoprazole  40 mg Oral Daily   rosuvastatin  5 mg Oral QHS   sodium chloride flush  10-40 mL Intracatheter Q12H   sulfamethoxazole-trimethoprim  1 tablet Oral Once per day on Mon  Wed Fri   traZODone  50 mg Oral QHS   Continuous Infusions:  amiodarone 30 mg/hr (03/07/23 0924)   ceFEPime (MAXIPIME) IV 2 g (03/07/23 0810)   vancomycin 1,250 mg (03/07/23 1627)   PRN Meds:.acetaminophen **OR** acetaminophen, hydrOXYzine, lidocaine, melatonin, ondansetron (ZOFRAN) IV, sodium chloride flush    Objective: Weight change: 1.3 kg  Intake/Output Summary (Last 24 hours) at 03/07/2023 1753 Last data filed at 03/07/2023 0700 Gross per 24 hour  Intake 498.92 ml  Output 1160 ml  Net -661.08 ml   Blood pressure 121/66, pulse 76, temperature 97.7 F (36.5 C), resp. rate (!) 26, height 6' (1.829 m), weight 80 kg, SpO2 96 %. Temp:  [97.7 F (36.5 C)-100.9 F (38.3 C)] 97.7 F (36.5 C) (04/09 1619) Pulse Rate:  [68-112] 76 (04/09 1700) Resp:  [18-50] 26 (04/09 1700) BP: (107-153)/(60-99) 121/66 (04/09 1700) SpO2:  [82 %-97 %] 96 % (04/09 1700) FiO2 (%):  [90 %-100 %] 90 % (04/09 1406) Weight:  [80 kg] 80 kg (04/09 0440)  Physical Exam: Physical Exam Constitutional:      Appearance: He is well-developed.  HENT:  Head: Normocephalic and atraumatic.  Eyes:     Conjunctiva/sclera: Conjunctivae normal.  Cardiovascular:     Rate and Rhythm: Normal rate and regular rhythm.     Heart sounds: No murmur heard.    No friction rub. No gallop.  Pulmonary:     Effort: Pulmonary effort is normal. No respiratory distress.     Breath sounds: No stridor. Rhonchi present. No wheezing.  Abdominal:     General: There is no distension.     Palpations: Abdomen is soft.  Musculoskeletal:        General: Normal range of motion.     Cervical back: Normal range of motion and neck supple.  Skin:    General: Skin is warm and dry.     Findings: No erythema or rash.  Neurological:     General: No focal deficit present.     Mental Status: He is alert and oriented to person, place, and time.  Psychiatric:        Mood and Affect: Mood normal.        Behavior: Behavior normal.         Thought Content: Thought content normal.        Judgment: Judgment normal.      CBC:    BMET Recent Labs    03/06/23 0633 03/07/23 0240  NA 134* 130*  K 3.9 3.9  CL 100 100  CO2 21* 21*  GLUCOSE 116* 187*  BUN 14 14  CREATININE 1.13 1.04  CALCIUM 8.0* 7.5*     Liver Panel  Recent Labs    03/06/23 0633 03/07/23 0240  PROT 6.1* 5.9*  ALBUMIN 2.2* 1.9*  AST 46* 49*  ALT 32 38  ALKPHOS 77 81  BILITOT 0.5 0.3       Sedimentation Rate Recent Labs    03/06/23 2140  ESRSEDRATE 138*   C-Reactive Protein Recent Labs    03/05/23 1614 03/06/23 2140  CRP 21.8* 29.0*    Micro Results: Recent Results (from the past 720 hour(s))  Culture, blood (routine x 2)     Status: None (Preliminary result)   Collection Time: 03/05/23  4:14 PM   Specimen: BLOOD  Result Value Ref Range Status   Specimen Description BLOOD SITE NOT SPECIFIED  Final   Special Requests   Final    BOTTLES DRAWN AEROBIC AND ANAEROBIC Blood Culture adequate volume   Culture   Final    NO GROWTH 2 DAYS Performed at [facility], [address] [place], [place] [address]    Report Status PENDING  Incomplete  Resp panel by RT-PCR (RSV, Flu A&B, Covid) Anterior Nasal Swab     Status: None   Collection Time: 03/05/23  4:29 PM   Specimen: Anterior Nasal Swab  Result Value Ref Range Status   SARS Coronavirus 2 by RT PCR NEGATIVE NEGATIVE Final   Influenza A by PCR NEGATIVE NEGATIVE Final   Influenza B by PCR NEGATIVE NEGATIVE Final    Comment: (NOTE) The Xpert Xpress SARS-CoV-2/FLU/RSV plus assay is intended as an aid in the diagnosis of influenza from Nasopharyngeal swab specimens and should not be used as a sole basis for treatment. Nasal washings and aspirates are unacceptable for Xpert Xpress SARS-CoV-2/FLU/RSV testing.  Fact Sheet for Patients: BloggerCourse.com  Fact Sheet for Healthcare Providers: SeriousBroker.it  This test  is not yet approved or cleared by the [place] FDA and has been authorized for detection and/or diagnosis of SARS-CoV-2 by FDA under an Emergency  Use Authorization (EUA). This EUA will remain in effect (meaning this test can be used) for the duration of the COVID-19 declaration under Section 564(b)(1) of the Act, 21 U.S.C. section 360bbb-3(b)(1), unless the authorization is terminated or revoked.     Resp Syncytial Virus by PCR NEGATIVE NEGATIVE Final    Comment: (NOTE) Fact Sheet for Patients: BloggerCourse.com  Fact Sheet for Healthcare Providers: SeriousBroker.it  This test is not yet approved or cleared by the [place] FDA and has been authorized for detection and/or diagnosis of SARS-CoV-2 by FDA under an Emergency Use Authorization (EUA). This EUA will remain in effect (meaning this test can be used) for the duration of the COVID-19 declaration under Section 564(b)(1) of the Act, 21 U.S.C. section 360bbb-3(b)(1), unless the authorization is terminated or revoked.  Performed at [facility], [address] [place], [place] [address]   Culture, blood (routine x 2)     Status: None (Preliminary result)   Collection Time: 03/05/23  4:33 PM   Specimen: BLOOD LEFT FOREARM  Result Value Ref Range Status   Specimen Description BLOOD LEFT FOREARM  Final   Special Requests   Final    BOTTLES DRAWN AEROBIC AND ANAEROBIC Blood Culture results may not be optimal due to an excessive volume of blood received in culture bottles   Culture   Final    NO GROWTH 2 DAYS Performed at [facility], [address], [place], [place] [address]    Report Status PENDING  Incomplete  MRSA Next Gen by PCR, Nasal     Status: None   Collection Time: 03/06/23  8:34 PM   Specimen: Nasal Mucosa; Nasal Swab  Result Value Ref Range Status   MRSA by PCR Next Gen NOT DETECTED NOT DETECTED Final    Comment: (NOTE) The GeneXpert MRSA  Assay (FDA approved for NASAL specimens only), is one component of a comprehensive MRSA colonization surveillance program. It is not intended to diagnose MRSA infection nor to guide or monitor treatment for MRSA infections. Test performance is not FDA approved in patients less than 79 years old. Performed at [facility], [address], [place], [place] [address]     Studies/Results: VAS [place] LOWER EXTREMITY VENOUS (DVT)  Result Date: 03/07/2023  Lower Venous DVT Study Patient Name:  FUMIO BERARD [facility]  Date of Exam:   03/07/2023 Medical Rec #: [id number]       Accession #:    [id number] Date of Birth: [birth year]      Patient Gender: M Patient Age:   31 years Exam Location:  [facility] Procedure:      VAS [place] LOWER EXTREMITY VENOUS (DVT) Referring Phys: Arian Murley VAN DAM --------------------------------------------------------------------------------  Indications: Pulmonary embolism.  Anticoagulation: Heparin. Comparison Study: No prior study. Performing Technologist: McKayla Maag RVT, [place]  Examination Guidelines: A complete evaluation includes B-mode imaging, spectral Doppler, color Doppler, and power Doppler as needed of all accessible portions of each vessel. Bilateral testing is considered an integral part of a complete examination. Limited examinations for reoccurring indications may be performed as noted. The reflux portion of the exam is performed with the patient in reverse Trendelenburg.  +---------+---------------+---------+-----------+----------+--------------+ RIGHT    CompressibilityPhasicitySpontaneityPropertiesThrombus Aging +---------+---------------+---------+-----------+----------+--------------+ CFV      Full           Yes      Yes                                 +---------+---------------+---------+-----------+----------+--------------+  SFJ      Full                                                         +---------+---------------+---------+-----------+----------+--------------+ FV Prox  Full                                                        +---------+---------------+---------+-----------+----------+--------------+ FV Mid   Full                                                        +---------+---------------+---------+-----------+----------+--------------+ FV DistalFull                                                        +---------+---------------+---------+-----------+----------+--------------+ PFV      Full                                                        +---------+---------------+---------+-----------+----------+--------------+ POP      Full           Yes      Yes                                 +---------+---------------+---------+-----------+----------+--------------+ PTV      Full                                                        +---------+---------------+---------+-----------+----------+--------------+ PERO     Partial        Yes      Yes                  Acute          +---------+---------------+---------+-----------+----------+--------------+   +---------+---------------+---------+-----------+----------+--------------+ LEFT     CompressibilityPhasicitySpontaneityPropertiesThrombus Aging +---------+---------------+---------+-----------+----------+--------------+ CFV      Full           Yes      Yes                                 +---------+---------------+---------+-----------+----------+--------------+ SFJ      Full                                                        +---------+---------------+---------+-----------+----------+--------------+  FV Prox  Full                                                        +---------+---------------+---------+-----------+----------+--------------+ FV Mid   Full                                                         +---------+---------------+---------+-----------+----------+--------------+ FV DistalFull                                                        +---------+---------------+---------+-----------+----------+--------------+ PFV      Full                                                        +---------+---------------+---------+-----------+----------+--------------+ POP      Full           Yes      Yes                                 +---------+---------------+---------+-----------+----------+--------------+ PTV      Full                                                        +---------+---------------+---------+-----------+----------+--------------+ PERO     Full                                                        +---------+---------------+---------+-----------+----------+--------------+     Summary: RIGHT: - Findings consistent with acute deep vein thrombosis involving the right peroneal veins. - No cystic structure found in the popliteal fossa.  LEFT: - There is no evidence of deep vein thrombosis in the lower extremity.  - No cystic structure found in the popliteal fossa.  *See table(s) above for measurements and observations.    Preliminary    VAS [place] UPPER EXTREMITY VENOUS DUPLEX  Result Date: 03/07/2023 UPPER VENOUS STUDY  Patient Name:  OKECHUKWU REGNIER [facility]  Date of Exam:   03/07/2023 Medical Rec #: [id number]       Accession #:    [id number] Date of Birth: 12/16/1929      Patient Gender: M Patient Age:   44 years Exam Location:  [facility] Procedure:      VAS [place] UPPER EXTREMITY VENOUS DUPLEX Referring Phys: [place] Revoir VAN DAM --------------------------------------------------------------------------------  Indications: pulmonary embolism Anticoagulation: Heparin. Limitations: IV placement, bandages. Comparison Study: No prior study. Performing Technologist: McKayla Maag RVT, [place]  Examination Guidelines: A complete evaluation includes B-mode imaging, spectral Doppler,  color Doppler, and power Doppler as needed of all accessible portions of each vessel. Bilateral testing is considered an integral part of a complete examination. Limited examinations for reoccurring indications may be performed as noted.  Right Findings: +----------+------------+---------+-----------+----------+-------+ RIGHT     CompressiblePhasicitySpontaneousPropertiesSummary +----------+------------+---------+-----------+----------+-------+ IJV           Full       Yes       Yes                      +----------+------------+---------+-----------+----------+-------+ Subclavian    Full       Yes       Yes                      +----------+------------+---------+-----------+----------+-------+ Axillary      Full       Yes       Yes                      +----------+------------+---------+-----------+----------+-------+ Brachial      Full       Yes       Yes                      +----------+------------+---------+-----------+----------+-------+ Radial        Full                                          +----------+------------+---------+-----------+----------+-------+ Ulnar         Full                                          +----------+------------+---------+-----------+----------+-------+ Cephalic      Full                                          +----------+------------+---------+-----------+----------+-------+ Basilic       Full                                          +----------+------------+---------+-----------+----------+-------+  Left Findings: +----------+------------+---------+-----------+----------+-------+ LEFT      CompressiblePhasicitySpontaneousPropertiesSummary +----------+------------+---------+-----------+----------+-------+ IJV           Full       Yes       Yes                      +----------+------------+---------+-----------+----------+-------+ Subclavian    Full       Yes       Yes                       +----------+------------+---------+-----------+----------+-------+ Axillary      Full       Yes       Yes                      +----------+------------+---------+-----------+----------+-------+ Brachial      Full       Yes       Yes                      +----------+------------+---------+-----------+----------+-------+  Radial        Full                                          +----------+------------+---------+-----------+----------+-------+ Ulnar         Full                                          +----------+------------+---------+-----------+----------+-------+ Cephalic      Full                                          +----------+------------+---------+-----------+----------+-------+ Basilic       Full                                          +----------+------------+---------+-----------+----------+-------+  Summary: No evidence of deep vein or superficial vein thrombosis involving the right and left upper extremities. Right: However, unable to visualize segments of the brachial veins due to picc line and bandage.  Left: However, unable to visualize segments of the radial and ulnar veins due to IV placement.  *See table(s) above for measurements and observations.     Preliminary    DG CHEST PORT 1 VIEW  Result Date: 03/07/2023 CLINICAL DATA:  Dyspnea. EXAM: PORTABLE CHEST 1 VIEW COMPARISON:  03/05/2023 FINDINGS: The cardio pericardial silhouette is enlarged. Diffuse interstitial opacity again noted with progression of peripheral patchy areas of airspace opacity bilaterally. No pleural effusion. Right PICC line tip overlies the SVC/RA junction. Telemetry leads overlie the chest. Peripheral IMPRESSION: Diffuse interstitial opacity with progression of peripheral patchy areas of airspace opacity bilaterally suggesting multifocal pneumonia. Electronically Signed   By: Kennith Center M.D.   On: 03/07/2023 08:16   ECHOCARDIOGRAM COMPLETE  Result Date: 03/06/2023     ECHOCARDIOGRAM REPORT   Patient Name:   KYERE GALATI [facility] Date of Exam: 03/06/2023 Medical Rec #:  [id number]      Height:       72.0 in Accession #:    [id number]     Weight:       170.0 lb Date of Birth:  Feb 20, 1930     BSA:          1.988 m Patient Age:    89 years       BP:           140/80 mmHg Patient Gender: M              HR:           91 bpm. Exam Location:  Inpatient Procedure: 2D Echo, Cardiac Doppler and Color Doppler Indications:    I26.09 Pulmonary embolus  History:        Patient has prior history of Echocardiogram examinations, most                 recent 06/08/2022. CHF, AAA; Risk Factors:Dyslipidemia,                 Hypertension and Non-Smoker.  Sonographer:    Dondra Prader RVT RCS Referring Phys: [id number] Angie Fava  Sonographer Comments: Technically challenging study due  to limited acoustic windows, Technically difficult study due to poor echo windows, suboptimal parasternal window and suboptimal apical window. Image acquisition challenging due to uncooperative patient. Patient could not tolerate exam; Patient would not lie still. IMPRESSIONS  1. Abnormal septal motion . Left ventricular ejection fraction, by estimation, is 55 to 60%. The left ventricle has normal function. The left ventricle has no regional wall motion abnormalities. Left ventricular diastolic parameters were normal.  2. No evidence of cor pulmonale in setting of recent PE . Right ventricular systolic function is normal. The right ventricular size is normal.  3. The mitral valve is normal in structure. No evidence of mitral valve regurgitation. No evidence of mitral stenosis.  4. Mean gradient across valve is 10 mmHg peak 16.8 mmHg but AVA 2.1 cm2 Similar AV sclerosis on TTE done at [facility] Some shadowing artifact in LVOT Did not think any vegetatiions /SBE present has had recent negative tagged WBC scan at [facility] as well with negative BC;s . The aortic valve is tricuspid. There is moderate calcification of the aortic valve. There is  moderate thickening of the aortic valve. Aortic valve regurgitation is not visualized. Aortic valve sclerosis/calcification is present, without any evidence of aortic stenosis.  5. The inferior vena cava is normal in size with greater than 50% respiratory variability, suggesting right atrial pressure of 3 mmHg. FINDINGS  Left Ventricle: Abnormal septal motion. Left ventricular ejection fraction, by estimation, is 55 to 60%. The left ventricle has normal function. The left ventricle has no regional wall motion abnormalities. The left ventricular internal cavity size was normal in size. There is no left ventricular hypertrophy. Left ventricular diastolic parameters were normal. Right Ventricle: No evidence of cor pulmonale in setting of recent PE. The right ventricular size is normal. No increase in right ventricular wall thickness. Right ventricular systolic function is normal. Left Atrium: Left atrial size was normal in size. Right Atrium: Right atrial size was normal in size. Pericardium: There is no evidence of pericardial effusion. Mitral Valve: The mitral valve is normal in structure. No evidence of mitral valve regurgitation. No evidence of mitral valve stenosis. Tricuspid Valve: The tricuspid valve is normal in structure. Tricuspid valve regurgitation is trivial. No evidence of tricuspid stenosis. Aortic Valve: Mean gradient across valve is 10 mmHg peak 16.8 mmHg but AVA 2.1 cm2 Similar AV sclerosis on TTE done at [facility] Some shadowing artifact in LVOT Did not think any vegetatiions /SBE present has had recent negative tagged WBC scan at [facility] as well with negative BC;s. The aortic valve is tricuspid. There is moderate calcification of the aortic valve. There is moderate thickening of the aortic valve. Aortic valve regurgitation is not visualized. Aortic valve sclerosis/calcification is present, without any evidence of aortic stenosis. Aortic valve mean gradient measures 10.0 mmHg. Aortic valve peak gradient  measures 16.8 mmHg. Aortic valve area, by VTI measures 2.25 cm. Pulmonic Valve: The pulmonic valve was normal in structure. Pulmonic valve regurgitation is not visualized. No evidence of pulmonic stenosis. Aorta: The aortic root is normal in size and structure. Venous: The inferior vena cava is normal in size with greater than 50% respiratory variability, suggesting right atrial pressure of 3 mmHg. IAS/Shunts: No atrial level shunt detected by color flow Doppler.  LEFT VENTRICLE PLAX 2D LVIDd:         3.90 cm   Diastology LVIDs:         2.60 cm   LV e' medial:    6.45 cm/s LV PW:  1.30 cm   LV E/e' medial:  12.6 LV IVS:        0.80 cm   LV e' lateral:   8.03 cm/s LVOT diam:     2.10 cm   LV E/e' lateral: 10.1 LV SV:         79 LV SV Index:   40 LVOT Area:     3.46 cm  RIGHT VENTRICLE             IVC RV Basal diam:  3.90 cm     IVC diam: 2.20 cm RV S prime:     11.30 cm/s TAPSE (M-mode): 2.3 cm LEFT ATRIUM           Index        RIGHT ATRIUM           Index LA diam:      2.90 cm 1.46 cm/m   RA Area:     13.00 cm LA Vol (A2C): 35.6 ml 17.90 ml/m  RA Volume:   29.60 ml  14.89 ml/m LA Vol (A4C): 26.3 ml 13.23 ml/m  AORTIC VALVE                     PULMONIC VALVE AV Area (Vmax):    2.21 cm      PV Vmax:       0.71 m/s AV Area (Vmean):   2.14 cm      PV Peak grad:  2.0 mmHg AV Area (VTI):     2.25 cm AV Vmax:           205.00 cm/s AV Vmean:          151.000 cm/s AV VTI:            0.349 m AV Peak Grad:      16.8 mmHg AV Mean Grad:      10.0 mmHg LVOT Vmax:         131.00 cm/s LVOT Vmean:        93.300 cm/s LVOT VTI:          0.227 m LVOT/AV VTI ratio: 0.65  AORTA Ao Root diam: 3.60 cm Ao Asc diam:  3.50 cm Ao Arch diam: 3.4 cm MITRAL VALVE               TRICUSPID VALVE MV Area (PHT): 3.24 cm    TR Peak grad:   31.6 mmHg MV Decel Time: 234 msec    TR Vmax:        281.00 cm/s MV E velocity: 81.50 cm/s MV A velocity: 82.60 cm/s  SHUNTS MV E/A ratio:  0.99        Systemic VTI:  0.23 m                             Systemic Diam: 2.10 cm Charlton Haws MD Electronically signed by Charlton Haws MD Signature Date/Time: 03/06/2023/4:39:06 PM    Final    CT Angio Chest PE W and/or Wo Contrast  Result Date: 03/05/2023 CLINICAL DATA:  Generalized weakness with urinary frequency. Patient on antibiotic for unknown organism via PICC line. EXAM: CT ANGIOGRAPHY CHEST WITH CONTRAST TECHNIQUE: Multidetector CT imaging of the chest was performed using the standard protocol during bolus administration of intravenous contrast. Multiplanar CT image reconstructions and MIPs were obtained to evaluate the vascular anatomy. RADIATION DOSE REDUCTION: This exam was performed according to the departmental dose-optimization program which includes automated  exposure control, adjustment of the mA and/or kV according to patient size and/or use of iterative reconstruction technique. CONTRAST:  75mL OMNIPAQUE IOHEXOL 350 MG/ML SOLN COMPARISON:  Chest radiographs earlier today and CTA chest 12/02/2022 FINDINGS: Cardiovascular: Satisfactory opacification of the pulmonary arteries to the segmental level. Acute pulmonary embolism arising at the distal right main pulmonary artery and extending into the lobar, segmental and subsegmental branches of the right middle and lower lobe pulmonary arteries. Diffuse aneurysmal dilation of the ascending, transverse, and descending thoracic aorta. The mid ascending aorta measures 44 mm in maximum diameter. The proximal descending thoracic aorta measures 51 mm. These are not significantly changed from 12/02/2022. Calcified atherosclerotic plaque. Prominent PICC tip in the low SVC. Mild cardiomegaly. Coronary artery calcification. Mediastinum/Nodes: Prominent mediastinal and hilar nodes measuring up to 1.2 cm in the left hilum (5/73 are favored reactive. Unremarkable esophagus. Lungs/Pleura: Bilateral patchy ground-glass and consolidative opacities with interlobular septal thickening suggestive of multifocal pneumonia. This  is superimposed on a background of peripheral subpleural reticulation and bronchiolectasis compatible with interstitial lung disease. Small bilateral pleural effusions. No pneumothorax. Upper Abdomen: No acute abnormality. Musculoskeletal: No chest wall abnormality. No acute osseous findings. Review of the MIP images confirms the above findings. IMPRESSION: 1. Acute pulmonary embolism arising at the distal right main pulmonary artery and extending into right middle and lower pulmonary arteries. CT evidence of right heart strain (RV/LV Ratio = 1.14) consistent with at least submassive (intermediate risk) PE. The presence of right heart strain has been associated with an increased risk of morbidity and mortality. Please refer to the "Code PE Focused" order set in EPIC. 2. Pulmonary parenchymal findings compatible with multifocal pneumonia superimposed on a background of interstitial lung disease. 3.  Small bilateral pleural effusions. 4. Unchanged aneurysmal dilation of the ascending, transverse, and descending thoracic aorta with maximum diameter of 51 mm in the proximal descending aorta. Recommend semi-annual imaging followup by CTA or MRA and referral to cardiothoracic surgery if not already obtained. This recommendation follows 2010 ACCF/AHA/AATS/ACR/ASA/SCA/SCAI/SIR/STS/SVM Guidelines for the Diagnosis and Management of Patients With Thoracic Aortic Disease. Circulation. 2010; 121: Z610-R60. Aortic aneurysm NOS (ICD10-I71.9) These results were called by telephone at the time of interpretation on 03/05/2023 at 7:58 pm to provider [facility] , who verbally acknowledged these results. Electronically Signed   By: Minerva Fester M.D.   On: 03/05/2023 19:58   CT Head Wo Contrast  Result Date: 03/05/2023 CLINICAL DATA:  Mental status change EXAM: CT HEAD WITHOUT CONTRAST TECHNIQUE: Contiguous axial images were obtained from the base of the skull through the vertex without intravenous contrast. RADIATION DOSE  REDUCTION: This exam was performed according to the departmental dose-optimization program which includes automated exposure control, adjustment of the mA and/or kV according to patient size and/or use of iterative reconstruction technique. COMPARISON:  Head CT 12/30/2021 FINDINGS: Brain: No evidence of acute infarction, hemorrhage, hydrocephalus, extra-axial collection or mass lesion/mass effect. There is stable mild periventricular white matter hypodensity, likely chronic small vessel ischemic change. There is stable mild diffuse atrophy. Vascular: Atherosclerotic calcifications are present within the cavernous internal carotid arteries. Skull: Normal. Negative for fracture or focal lesion. Sinuses/Orbits: No acute finding. Other: None. IMPRESSION: 1. No acute intracranial abnormality. 2. Stable mild chronic small vessel ischemic changes. 3. Stable mild diffuse atrophy. Electronically Signed   By: Darliss Cheney M.D.   On: 03/05/2023 19:39      Assessment/Plan:  INTERVAL HISTORY: pt moved to ICU on high flow O2 and on solumedrol, DVT found  in RLE   Principal Problem:   Acute pulmonary embolism Active Problems:   Hyperlipidemia   PMR (polymyalgia rheumatica)   AAA (abdominal aortic aneurysm) without rupture (HCC)   Generalized weakness   Acute hypoxic respiratory failure   Acute hyponatremia   Elevated troponin   Lactic acidosis   HCAP (healthcare-associated pneumonia)   Chronic diastolic CHF (congestive heart failure)   BPH (benign prostatic hyperplasia)   Pulmonary embolism   Eosinophilic pneumonia   Aortitis   Atrial flutter   FUO (fever of unknown origin)   S/P insertion of endovascular thoracic aortic stent graft    DAYN BARICH is a 89 y.o. male with complicated medical history including aortic aneurysm status post placement of stent graft, polymyalgia rheumatica that has been well-controlled on prednisone who has been experiencing malaise for nearly 8 months with intermittent  fevers and elevated inflammatory markers, who was worked up for this condition by infectious disease at [facility] including PET scan that showed increased uptake in the aorta near stent graft with question of aortitis.  The tagged white cell scan subsequently did not show uptake in this area or anywhere else in the body.  I reviewed the case with Dr.Julia Dolores Frame yesterday over the phone and she stated that they had reviewed the PET scan again with radiology and that radiology is not entirely convinced of the uptake being particularly dramatic.  Regardless the patient was the begun on empiric antibiotic regimen for possible bacterial infectious aortitis with daptomycin and ceftriaxone.  The plan was to see how the patient did on this including monitoring his inflammatory markers.  All along however there has been suspicion that there might be an autoimmune process involved and the patient is followed closely by Dr. Alberteen Spindle with nephrology at [facility].  He has now been admitted to [facility] with an acute pulmonary embolism and a CT angiogram that also shows multifocal infiltrates.  He is experienced progressive hypoxemic respiratory failure requiring high flow oxygen currently on high flow oxygen via nasal cannula.  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  His antibiotics currently are vancomycin and cefepime having been moved from daptomycin ceftriaxone disease.  #1 acute pulmonary embolism with DVT:  I had concerned that the PICC line could have been the source of DVT but after Dopplers were performed this afternoon it was found that a DVT was present in the right lower extremity they could not fully evaluate the right upper extremity where the PICC line was present due to the presence of the PICC line.  He is currently on  anticoagulation he has evidence of right heart strain on imaging and had developed some atrial flutter that has subsequent cardioverted to normal sinus rhythm after amiodarone.  #2 Pneumonia:  I am highly skeptical of him having a bacterial pneumonia.  I suspect that his multifocal pneumonia could be possibly due to daptomycin induced eosinophilic pneumonia versus the evolution of what ever inflammatory condition was causing his malaise and elevated inflammatory markers.  He is CT scan interestingly was read is also showing a background of "interstitial lung disease".  Dr. Dolores Frame and also critical care medicine at [facility] have felt that this may be suggestive of a noninfectious pathology.   He is on Solu-Medrol 125 mg every 6 hours.  Ideally he would be diagnosed via bronchoscopy with BAL and potential transbronchial  biopsy.  However currently such approach would mean him ending up being intubated which the team is trying to avoid.  I do think it will also be potentially informative how he responds globally to corticosteroids and I think this is a good time to be seeing how he responds to the steroids given that he is covered with antibacterial antibiotics.  I really do not think that he has an opportunistic infection causing his current pathology as the progression has been fairly acute and he was already worked up fairly extensively [facility].  #3 atrial fibrillation: Status post cardioversion on amiodarone amiodarone has been stopped.  Believe part of this was to avoid concerns about potential confounding effects of amiodarone induced pulmonary pathology.  He is currently in normal sinus rhythm when I examined him this morning.  4.  Insomnia from corticosteroids: Defer to pulmonary critical care medicine.  I discussed the patient's case with the patient his wife and son who is at the bedside and then separately around 1:15 PM discussed it with his other [place].  I have personally spent 56 minutes involved in face-to-face and non-face-to-face activities for this patient on the day of the visit. Professional time spent includes the following activities: Preparing to see the patient (review of tests), Obtaining and/or reviewing separately obtained history (admission/discharge record), Performing a medically appropriate examination and/or evaluation , Ordering medications/tests/procedures, referring and communicating with other health care professionals, Documenting clinical information in the EMR, Independently interpreting results (not separately reported), Communicating results to the patient/family/caregiver, Counseling and educating the patient/family/caregiver and Care coordination (not separately reported).       LOS: 1 day   Acey LavCornelius Van Dam 03/07/2023, 5:53 PM

## 2023-03-07 NOTE — Progress Notes (Addendum)
Subjective:  Looks worn out. Spoke with son at bedside  Respiratory status tenuous Rhythm stable   Objective:  Vitals:   03/07/23 0600 03/07/23 0700 03/07/23 0800 03/07/23 0803  BP: (!) 142/84 (!) 149/95 131/82 131/82  Pulse: 74 72 70 82  Resp: 18 (!) 25 (!) 27 (!) 26  Temp:      TempSrc:      SpO2: 95% 94% 94% 97%  Weight:      Height:        Intake/Output from previous day:  Intake/Output Summary (Last 24 hours) at 03/07/2023 0839 Last data filed at 03/07/2023 0700 Gross per 24 hour  Intake 658.85 ml  Output 1160 ml  Net -501.15 ml    Physical Exam: Elderly male  Mild AS/AV sclerosis murmur  Rhonchi bilaterally  Abdomen benign Trace edema  Lab Results: Basic Metabolic Panel: Recent Labs    03/06/23 0633 03/07/23 0240  NA 134* 130*  K 3.9 3.9  CL 100 100  CO2 21* 21*  GLUCOSE 116* 187*  BUN 14 14  CREATININE 1.13 1.04  CALCIUM 8.0* 7.5*  MG 2.0 1.9  PHOS 3.0  --    Liver Function Tests: Recent Labs    03/06/23 0633 03/07/23 0240  AST 46* 49*  ALT 32 38  ALKPHOS 77 81  BILITOT 0.5 0.3  PROT 6.1* 5.9*  ALBUMIN 2.2* 1.9*   No results for input(s): "LIPASE", "AMYLASE" in the last 72 hours. CBC: Recent Labs    03/05/23 1614 03/05/23 1622 03/06/23 0633 03/07/23 0240  WBC 20.0*  --  22.0* 26.1*  NEUTROABS 17.8*  --   --  25.3*  HGB 10.2*   < > 10.8* 10.7*  HCT 31.4*   < > 33.6* 32.4*  MCV 96.0  --  96.8 95.6  PLT 320  --  346 325   < > = values in this interval not displayed.   Cardiac Enzymes: Recent Labs    03/05/23 1614 03/06/23 2140  CKTOTAL 106 204    Thyroid Function Tests: Recent Labs    03/05/23 1942  TSH 1.869   Anemia Panel: Recent Labs    03/05/23 2248 03/06/23 2140  VITAMINB12 1,399*  --   FERRITIN  --  201    Imaging: DG CHEST PORT 1 VIEW  Result Date: 03/07/2023 CLINICAL DATA:  Dyspnea. EXAM: PORTABLE CHEST 1 VIEW COMPARISON:  03/05/2023 FINDINGS: The cardio pericardial silhouette is enlarged. Diffuse  interstitial opacity again noted with progression of peripheral patchy areas of airspace opacity bilaterally. No pleural effusion. Right PICC line tip overlies the SVC/RA junction. Telemetry leads overlie the chest. Peripheral IMPRESSION: Diffuse interstitial opacity with progression of peripheral patchy areas of airspace opacity bilaterally suggesting multifocal pneumonia. Electronically Signed   By: Kennith Center M.D.   On: 03/07/2023 08:16   ECHOCARDIOGRAM COMPLETE  Result Date: 03/06/2023    ECHOCARDIOGRAM REPORT   Patient Name:   [name] [facility] Date of Exam: 03/06/2023 Medical Rec #:  [id number]      Height:       72.0 in Accession #:    [id number]     Weight:       170.0 lb Date of Birth:  10/25/1930     BSA:          1.988 m Patient Age:    89 years       BP:           140/80 mmHg Patient Gender: M  HR:           91 bpm. Exam Location:  Inpatient Procedure: 2D Echo, Cardiac Doppler and Color Doppler Indications:    I26.09 Pulmonary embolus  History:        Patient has prior history of Echocardiogram examinations, most                 recent 06/08/2022. CHF, AAA; Risk Factors:Dyslipidemia,                 Hypertension and Non-Smoker.  Sonographer:    Dondra Prader RVT RCS Referring Phys: [id number] Angie Fava  Sonographer Comments: Technically challenging study due to limited acoustic windows, Technically difficult study due to poor echo windows, suboptimal parasternal window and suboptimal apical window. Image acquisition challenging due to uncooperative patient. Patient could not tolerate exam; Patient would not lie still. IMPRESSIONS  1. Abnormal septal motion . Left ventricular ejection fraction, by estimation, is 55 to 60%. The left ventricle has normal function. The left ventricle has no regional wall motion abnormalities. Left ventricular diastolic parameters were normal.  2. No evidence of cor pulmonale in setting of recent PE . Right ventricular systolic function is normal. The right  ventricular size is normal.  3. The mitral valve is normal in structure. No evidence of mitral valve regurgitation. No evidence of mitral stenosis.  4. Mean gradient across valve is 10 mmHg peak 16.8 mmHg but AVA 2.1 cm2 Similar AV sclerosis on TTE done at [facility] Some shadowing artifact in LVOT Did not think any vegetatiions /SBE present has had recent negative tagged WBC scan at [facility] as well with negative BC;s . The aortic valve is tricuspid. There is moderate calcification of the aortic valve. There is moderate thickening of the aortic valve. Aortic valve regurgitation is not visualized. Aortic valve sclerosis/calcification is present, without any evidence of aortic stenosis.  5. The inferior vena cava is normal in size with greater than 50% respiratory variability, suggesting right atrial pressure of 3 mmHg. FINDINGS  Left Ventricle: Abnormal septal motion. Left ventricular ejection fraction, by estimation, is 55 to 60%. The left ventricle has normal function. The left ventricle has no regional wall motion abnormalities. The left ventricular internal cavity size was normal in size. There is no left ventricular hypertrophy. Left ventricular diastolic parameters were normal. Right Ventricle: No evidence of cor pulmonale in setting of recent PE. The right ventricular size is normal. No increase in right ventricular wall thickness. Right ventricular systolic function is normal. Left Atrium: Left atrial size was normal in size. Right Atrium: Right atrial size was normal in size. Pericardium: There is no evidence of pericardial effusion. Mitral Valve: The mitral valve is normal in structure. No evidence of mitral valve regurgitation. No evidence of mitral valve stenosis. Tricuspid Valve: The tricuspid valve is normal in structure. Tricuspid valve regurgitation is trivial. No evidence of tricuspid stenosis. Aortic Valve: Mean gradient across valve is 10 mmHg peak 16.8 mmHg but AVA 2.1 cm2 Similar AV sclerosis on TTE done  at [facility] Some shadowing artifact in LVOT Did not think any vegetatiions /SBE present has had recent negative tagged WBC scan at [facility] as well with negative BC;s. The aortic valve is tricuspid. There is moderate calcification of the aortic valve. There is moderate thickening of the aortic valve. Aortic valve regurgitation is not visualized. Aortic valve sclerosis/calcification is present, without any evidence of aortic stenosis. Aortic valve mean gradient measures 10.0 mmHg. Aortic valve peak gradient measures 16.8 mmHg. Aortic valve  area, by VTI measures 2.25 cm. Pulmonic Valve: The pulmonic valve was normal in structure. Pulmonic valve regurgitation is not visualized. No evidence of pulmonic stenosis. Aorta: The aortic root is normal in size and structure. Venous: The inferior vena cava is normal in size with greater than 50% respiratory variability, suggesting right atrial pressure of 3 mmHg. IAS/Shunts: No atrial level shunt detected by color flow Doppler.  LEFT VENTRICLE PLAX 2D LVIDd:         3.90 cm   Diastology LVIDs:         2.60 cm   LV e' medial:    6.45 cm/s LV PW:         1.30 cm   LV E/e' medial:  12.6 LV IVS:        0.80 cm   LV e' lateral:   8.03 cm/s LVOT diam:     2.10 cm   LV E/e' lateral: 10.1 LV SV:         79 LV SV Index:   40 LVOT Area:     3.46 cm  RIGHT VENTRICLE             IVC RV Basal diam:  3.90 cm     IVC diam: 2.20 cm RV S prime:     11.30 cm/s TAPSE (M-mode): 2.3 cm LEFT ATRIUM           Index        RIGHT ATRIUM           Index LA diam:      2.90 cm 1.46 cm/m   RA Area:     13.00 cm LA Vol (A2C): 35.6 ml 17.90 ml/m  RA Volume:   29.60 ml  14.89 ml/m LA Vol (A4C): 26.3 ml 13.23 ml/m  AORTIC VALVE                     PULMONIC VALVE AV Area (Vmax):    2.21 cm      PV Vmax:       0.71 m/s AV Area (Vmean):   2.14 cm      PV Peak grad:  2.0 mmHg AV Area (VTI):     2.25 cm AV Vmax:           205.00 cm/s AV Vmean:          151.000 cm/s AV VTI:            0.349 m AV Peak Grad:       16.8 mmHg AV Mean Grad:      10.0 mmHg LVOT Vmax:         131.00 cm/s LVOT Vmean:        93.300 cm/s LVOT VTI:          0.227 m LVOT/AV VTI ratio: 0.65  AORTA Ao Root diam: 3.60 cm Ao Asc diam:  3.50 cm Ao Arch diam: 3.4 cm MITRAL VALVE               TRICUSPID VALVE MV Area (PHT): 3.24 cm    TR Peak grad:   31.6 mmHg MV Decel Time: 234 msec    TR Vmax:        281.00 cm/s MV E velocity: 81.50 cm/s MV A velocity: 82.60 cm/s  SHUNTS MV E/A ratio:  0.99        Systemic VTI:  0.23 m  Systemic Diam: 2.10 cm Charlton HawsPeter Ashan Cueva MD Electronically signed by Charlton HawsPeter Zabdiel Dripps MD Signature Date/Time: 03/06/2023/4:39:06 PM    Final    CT Angio Chest PE W and/or Wo Contrast  Result Date: 03/05/2023 CLINICAL DATA:  Generalized weakness with urinary frequency. Patient on antibiotic for unknown organism via PICC line. EXAM: CT ANGIOGRAPHY CHEST WITH CONTRAST TECHNIQUE: Multidetector CT imaging of the chest was performed using the standard protocol during bolus administration of intravenous contrast. Multiplanar CT image reconstructions and MIPs were obtained to evaluate the vascular anatomy. RADIATION DOSE REDUCTION: This exam was performed according to the departmental dose-optimization program which includes automated exposure control, adjustment of the mA and/or kV according to patient size and/or use of iterative reconstruction technique. CONTRAST:  75mL OMNIPAQUE IOHEXOL 350 MG/ML SOLN COMPARISON:  Chest radiographs earlier today and CTA chest 12/02/2022 FINDINGS: Cardiovascular: Satisfactory opacification of the pulmonary arteries to the segmental level. Acute pulmonary embolism arising at the distal right main pulmonary artery and extending into the lobar, segmental and subsegmental branches of the right middle and lower lobe pulmonary arteries. Diffuse aneurysmal dilation of the ascending, transverse, and descending thoracic aorta. The mid ascending aorta measures 44 mm in maximum diameter. The proximal  descending thoracic aorta measures 51 mm. These are not significantly changed from 12/02/2022. Calcified atherosclerotic plaque. Prominent PICC tip in the low SVC. Mild cardiomegaly. Coronary artery calcification. Mediastinum/Nodes: Prominent mediastinal and hilar nodes measuring up to 1.2 cm in the left hilum (5/73 are favored reactive. Unremarkable esophagus. Lungs/Pleura: Bilateral patchy ground-glass and consolidative opacities with interlobular septal thickening suggestive of multifocal pneumonia. This is superimposed on a background of peripheral subpleural reticulation and bronchiolectasis compatible with interstitial lung disease. Small bilateral pleural effusions. No pneumothorax. Upper Abdomen: No acute abnormality. Musculoskeletal: No chest wall abnormality. No acute osseous findings. Review of the MIP images confirms the above findings. IMPRESSION: 1. Acute pulmonary embolism arising at the distal right main pulmonary artery and extending into right middle and lower pulmonary arteries. CT evidence of right heart strain (RV/LV Ratio = 1.14) consistent with at least submassive (intermediate risk) PE. The presence of right heart strain has been associated with an increased risk of morbidity and mortality. Please refer to the "Code PE Focused" order set in EPIC. 2. Pulmonary parenchymal findings compatible with multifocal pneumonia superimposed on a background of interstitial lung disease. 3.  Small bilateral pleural effusions. 4. Unchanged aneurysmal dilation of the ascending, transverse, and descending thoracic aorta with maximum diameter of 51 mm in the proximal descending aorta. Recommend semi-annual imaging followup by CTA or MRA and referral to cardiothoracic surgery if not already obtained. This recommendation follows 2010 ACCF/AHA/AATS/ACR/ASA/SCA/SCAI/SIR/STS/SVM Guidelines for the Diagnosis and Management of Patients With Thoracic Aortic Disease. Circulation. 2010; 121: Z610-R60: E266-e36. Aortic aneurysm  NOS (ICD10-I71.9) These results were called by telephone at the time of interpretation on 03/05/2023 at 7:58 pm to provider [facility]ESS[facility] , who verbally acknowledged these results. Electronically Signed   By: Minerva Festeryler  Stutzman M.D.   On: 03/05/2023 19:58   CT Head Wo Contrast  Result Date: 03/05/2023 CLINICAL DATA:  Mental status change EXAM: CT HEAD WITHOUT CONTRAST TECHNIQUE: Contiguous axial images were obtained from the base of the skull through the vertex without intravenous contrast. RADIATION DOSE REDUCTION: This exam was performed according to the departmental dose-optimization program which includes automated exposure control, adjustment of the mA and/or kV according to patient size and/or use of iterative reconstruction technique. COMPARISON:  Head CT 12/30/2021 FINDINGS: Brain: No evidence of acute infarction, hemorrhage,  hydrocephalus, extra-axial collection or mass lesion/mass effect. There is stable mild periventricular white matter hypodensity, likely chronic small vessel ischemic change. There is stable mild diffuse atrophy. Vascular: Atherosclerotic calcifications are present within the cavernous internal carotid arteries. Skull: Normal. Negative for fracture or focal lesion. Sinuses/Orbits: No acute finding. Other: None. IMPRESSION: 1. No acute intracranial abnormality. 2. Stable mild chronic small vessel ischemic changes. 3. Stable mild diffuse atrophy. Electronically Signed   By: Darliss Cheney M.D.   On: 03/05/2023 19:39   DG Chest Port 1 View  Result Date: 03/05/2023 CLINICAL DATA:  sob EXAM: PORTABLE CHEST 1 VIEW COMPARISON:  January 24, 2023 FINDINGS: The cardiomediastinal silhouette is grossly unchanged and enlarged in contour.RIGHT upper extremity PICC tip terminates over the superior RIGHT atrium. No pleural effusion. No pneumothorax. Increased lateral opacities within the LEFT lung. Persistent background of coarse reticulation, similar in comparison to priors. IMPRESSION: Increased  opacities within the lateral LEFT lung. Differential considerations include infection, infarction atelectasis. Electronically Signed   By: Meda Klinefelter M.D.   On: 03/05/2023 17:27    Cardiac Studies:  ECG: ST no acute changes rate 108   Telemetry:  NSR   Echo: EF 55-60% normal RV AV sclerosis mean gradient 10 mmHg AVA 2.1 cm2   Medications:    Chlorhexidine Gluconate Cloth  6 each Topical Daily   enoxaparin (LOVENOX) injection  80 mg Subcutaneous Q12H   fluorometholone  1 drop Right Eye QHS   folic acid  1 mg Oral Daily   methylPREDNISolone (SOLU-MEDROL) injection  125 mg Intravenous Q6H   pantoprazole  40 mg Oral Daily   rosuvastatin  5 mg Oral QHS   sodium chloride flush  10-40 mL Intracatheter Q12H   sulfamethoxazole-trimethoprim  1 tablet Oral Once per day on Mon Wed Fri      amiodarone 30.24 mg/hr (03/07/23 0700)   ceFEPime (MAXIPIME) IV 2 g (03/07/23 0810)   vancomycin Stopped (03/06/23 1957)    Assessment/Plan:   Flutter resolved related to age and hypoxia continue amiodarone  Pulmonary:  complicated picture with history of PMR, elevated WBC ( on steroids )worsening infiltrates and high oxygen requirements Antibiotic coverage with Vanc and Maxipime as well as high dose steroids. Chronically elevated ESR/CRP AAA: post endovascular repair with endo-leak negative tagged WBC scan at [facility] post embolization of lumbar/sacral feeders  PE:  no RV strain on TTE anticoagulation for flutter and PE per primary service   Overall prognosis tenuous with as yet undiagnosed primary problem Per ID/pulmonary/CCM  Charlton Haws 03/07/2023, 8:39 AM

## 2023-03-07 NOTE — Progress Notes (Signed)
Bilateral lower extremity venous and bilateral upper extremity venous studies completed.   Preliminary results relayed to RN.  Please see CV Procedures for preliminary results.  Destina Mantei, RVT  3:53 PM 03/07/23

## 2023-03-07 NOTE — Evaluation (Signed)
Occupational Therapy Evaluation Patient Details Name: [name] MRN: [id number] DOB: August 03, 1930 Today's Date: 03/07/2023   History of Present Illness 89 yr old male  was admitted with an acute PE at [facility] ED and initially complaining of generalized weakness on 4/7. Patient developed SVT on the morning of 4/8 along with respiratory distress and hypoxia requiring non-re breather mask. PQD:IYMEBRAXENM rheumatica on chronic steroids, recent hospitalization for aortitis, CHF, chronic leukocytosis, AAA with S/P stent graft in 2018, HLD, and DDD   Clinical Impression   Patient admitted for the diagnosis above.  PTA he lives at home with his spouse, remains active, and needed no assist with ADL, iADL or mobility.  Currently limited by [facility] and + RLE DVT.  Patient needing generalized Min Guard for edge of bed sitting and was able to mobilize with PT earlier with up to Min/Mod A.  OT is indicated in the acute setting to address deficits listed, and if patient progresses as anticipated, home with home based rehab is a possibility.       Recommendations for follow up therapy are one component of a multi-disciplinary discharge planning process, led by the attending physician.  Recommendations may be updated based on patient status, additional functional criteria and insurance authorization.   Assistance Recommended at Discharge Intermittent Supervision/Assistance  Patient can return home with the following Help with stairs or ramp for entrance;A little help with bathing/dressing/bathroom;A little help with walking and/or transfers    Functional Status Assessment  Patient has had a recent decline in their functional status and demonstrates the ability to make significant improvements in function in a reasonable and predictable amount of time.  Equipment Recommendations  None recommended by OT    Recommendations for Other Services       Precautions / Restrictions Precautions Precautions:  Fall Precaution Comments: +DVT R UE.  HHFNC Restrictions Weight Bearing Restrictions: No      Mobility Bed Mobility Overal bed mobility: Needs Assistance Bed Mobility: Supine to Sit, Sit to Supine     Supine to sit: Min assist, HOB elevated Sit to supine: Min guard     Patient Response: Cooperative  Transfers Overall transfer level: Needs assistance Equipment used: 1 person hand held assist Transfers: Sit to/from Stand Sit to Stand: Min assist, From elevated surface                  Balance Overall balance assessment: Needs assistance Sitting-balance support: Feet supported, No upper extremity supported Sitting balance-Leahy Scale: Good     Standing balance support: Single extremity supported Standing balance-Leahy Scale: Poor                             ADL either performed or assessed with clinical judgement   ADL       Grooming: Wash/dry hands;Wash/dry face;Sitting;Min guard               Lower Body Dressing: Minimal assistance;Sit to/from stand   Toilet Transfer: Minimal assistance;BSC/3in1                   Vision Patient Visual Report: No change from baseline                  Pertinent Vitals/Pain       Hand Dominance Right   Extremity/Trunk Assessment             Communication Communication Communication: No difficulties   Cognition  General Comments   VSS on HFNC    Exercises     Shoulder Instructions      Home Living Family/patient expects to be discharged to:: Private residence Living Arrangements: Spouse/significant other Available Help at Discharge: Family;Available 24 hours/day Type of Home: House Home Access: Stairs to enter [employer] of Steps: 1   Home Layout: Multi-level;Able to live on main level with bedroom/bathroom Alternate Level Stairs-Number of Steps: 12   Bathroom Shower/Tub: Company secretary: Handicapped height Bathroom Accessibility: Yes How Accessible: Accessible via walker            Prior Functioning/Environment Prior Level of Function : Independent/Modified Independent             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[employer], works daily, Sales executive and exercises ADLs Comments: independent        OT Problem List: Decreased strength;Decreased activity tolerance;Impaired balance (sitting and/or standing)      OT Treatment/Interventions: Self-care/ADL training;Therapeutic activities;Therapeutic exercise;Patient/family education;Balance training;Energy conservation;DME and/or AE instruction    OT Goals(Current goals can be found in the care plan section) Acute Rehab OT Goals Patient Stated Goal: Return home OT Goal Formulation: With patient Time For Goal Achievement: 03/21/23 Potential to Achieve Goals: Good ADL Goals Pt Will Perform Grooming: with supervision;standing Pt Will Perform Lower Body Dressing: with supervision;sit to/from stand Pt Will Transfer to Toilet: with supervision;ambulating;regular height toilet  OT Frequency: Min 1X/week    Co-evaluation              AM-PAC OT "6 Clicks" Daily Activity     Outcome Measure Help from another person eating meals?: None Help from another person taking care of personal grooming?: A Little Help from another person toileting, which includes using toliet, bedpan, or urinal?: A Little Help from another person bathing (including washing, rinsing, drying)?: A Little Help from another person to put on and taking off regular upper body clothing?: A Little Help from another person to put on and taking off regular lower body clothing?: A Little 6 Click Score: 19   End of Session Equipment Utilized During Treatment: Oxygen Nurse Communication: Mobility status  Activity Tolerance: Patient tolerated treatment well Patient left: in bed;with call bell/phone within reach  OT Visit Diagnosis:  Unsteadiness on feet (R26.81);Muscle weakness (generalized) (M62.81)                Time: 1530-1600 OT Time Calculation (min): 30 min Charges:  OT General Charges $OT Visit: 1 Visit OT Evaluation $OT Eval Moderate Complexity: 1 Mod OT Treatments $Self Care/Home Management : 8-22 mins  03/07/2023  RP, OTR/L  Acute Rehabilitation Services  Office:  [phone]   Suzanna Obey 03/07/2023, 4:10 PM

## 2023-03-07 NOTE — Consult Note (Signed)
NAME:  [name], MRN:  [id number], DOB:  [birth year], LOS: 1 ADMISSION DATE:  03/05/2023, CONSULTATION DATE: 4/8  REFERRING MD:  Daiva Eves CHIEF COMPLAINT:  Generalized Weakness   History of Present Illness:  89 yr old male with significant pmhx of polymyalgia rheumatica on chronic steroids, recent hospitalization for aortitis, CHF, chronic leukocytosis, AAA with S/P stent graft in 2018, HLD, and DDD was admitted with an acute PE at [facility] ED and initially complaining of generalized weakness on 4/7. Patient developed SVT on the morning of 4/8 along with respiratory distress and hypoxia requiring non-re breather mask. Patient being managed by [facility], Cardiology, and Infectious Disease. PCCM consulted by ID to assist in managing the acute hypoxic respiratory failure.   Pertinent  Medical History   Past Medical History:  Diagnosis Date   AAA (abdominal aortic aneurysm)    Abnormal EKG    Left atrial abnormality   Allergic rhinitis    Allergy    Benign neoplasm of colon 04/14/10   3 small polyps on colonoscopy by Dr. Marina Goodell   Carpal tunnel syndrome    Cataract    Degenerative disc disease    Disturbances metabolism of methionine, homocystine, and cystathionine    Elevated homocysteine   Elevated prostate specific antigen (PSA)    Hearing loss    Hyperlipidemia    Impotence of organic origin    Penile implant   Internal hemorrhoids    Neck pain    Otosclerosis of both ears    Peripheral vascular disease    Bilateral femoral bruit   Plantar fasciitis    Polymyalgia rheumatica    Rotator cuff syndrome of left shoulder    Scoliosis    Shoulder pain      Significant Hospital Events: Including procedures, antibiotic start and stop dates in addition to other pertinent events   4/7 Admitted for Acute PE 4/8 Developed Rapid Atrial Flutter-HR 170s-180s , along with respiratory failure requiring NRB, PCCM consulted   Interim History / Subjective:  Patient was transitioned to high flow nasal  cannula oxygen, currently on 90% FiO2 and 45 L flow with O2 sat in low 90s Stated could not sleep overnight Continued to complain of dry cough Converted back to sinus rhythm  Objective   Blood pressure (!) 153/92, pulse 75, temperature 97.8 F (36.6 C), temperature source Oral, resp. rate (!) 26, height 6' (1.829 m), weight 80 kg, SpO2 (!) 82 %.    FiO2 (%):  [90 %-100 %] 90 %   Intake/Output Summary (Last 24 hours) at 03/07/2023 1028 Last data filed at 03/07/2023 0700 Gross per 24 hour  Intake 658.85 ml  Output 1160 ml  Net -501.15 ml   Filed Weights   03/05/23 2200 03/06/23 0624 03/07/23 0440  Weight: 77.1 kg 77.1 kg 80 kg    Examination: Physical exam: General: Chronically ill-appearing elderly male, lying on the bed HEENT: [place]/AT, eyes anicteric.  moist mucus membranes Neuro: Alert, awake following commands Chest: Tachypneic bilateral coarse crackles all over, no wheezes or rhonchi Heart: Regular rate and rhythm, no murmurs or gallops Abdomen: Soft, nontender, nondistended, bowel sounds present Skin: No rash  Labs and images were reviewed  Resolved Hospital Problem list   N/a   Assessment & Plan:  Acute Hypoxic Respiratory Failure in setting possibly of multifocal pna vs eosinophilic pna versus organizing pneumonia Acute PE  PMR on chronic prednisone, immunocompromised  Patient remains on high flow nasal cannula oxygen, unable to titrated down currently Continue IV  antibiotics, currently on vancomycin and cefepime, infectious disease following Started on high-dose steroid with Solu-Medrol 125 mg every 6 hours with suspected inflammatory lung disease Follow-up autoimmune panel Will try to get sputum culture and sputum pneumocystis PCR Ideally patient needs bronchoscopy with BAL and transbronchial biopsy but due to high oxygen requirement, will currently hold off Continue subcu Lovenox therapeutic dose Monitor intake and output  Paroxysmal atrial flutter/atrial  fibrillation Patient converted back to sinus rhythm Continue amiodarone infusion Will try to stop amiodarone by tomorrow to prevent further lung complication On therapeutic anticoagulation Echocardiogram showed normal EF with no wall motion abnormalities and diastolic parameters are normal, HFpEF was ruled out  Hyperlipidemia Continue Crestor  BPH Continue tamsulosin  AAA s/p aortic graft Possible aortitis Continue antibiotics, follows at [facility] (right click and "Reselect all SmartList Selections" daily)   Diet/type: Regular  DVT prophylaxis: systemic dose LMWH GI prophylaxis: PPI Lines: N/A Foley:  N/A Code Status:  full code Last date of multidisciplinary goals of care discussion [4/9: Patient, his son and and wife updated at beside, decision was to continue full scope of care]       This patient is critically ill with multiple organ system failure which requires frequent high complexity decision making, assessment, support, evaluation, and titration of therapies. This was completed through the application of advanced monitoring technologies and extensive interpretation of multiple databases.  During this encounter critical care time was devoted to patient care services described in this note for 41 minutes.    Cheri Fowler, MD [facility] See Amion for pager If no response to pager, please call [phone] until 7pm After 7pm, Please call E-link [phone]

## 2023-03-08 DIAGNOSIS — J9601 Acute respiratory failure with hypoxia: Secondary | ICD-10-CM | POA: Diagnosis not present

## 2023-03-08 DIAGNOSIS — Z7189 Other specified counseling: Secondary | ICD-10-CM

## 2023-03-08 DIAGNOSIS — I2699 Other pulmonary embolism without acute cor pulmonale: Secondary | ICD-10-CM | POA: Diagnosis not present

## 2023-03-08 DIAGNOSIS — J189 Pneumonia, unspecified organism: Secondary | ICD-10-CM | POA: Diagnosis not present

## 2023-03-08 DIAGNOSIS — I480 Paroxysmal atrial fibrillation: Secondary | ICD-10-CM

## 2023-03-08 DIAGNOSIS — I824Z1 Acute embolism and thrombosis of unspecified deep veins of right distal lower extremity: Secondary | ICD-10-CM

## 2023-03-08 DIAGNOSIS — E871 Hypo-osmolality and hyponatremia: Secondary | ICD-10-CM | POA: Diagnosis not present

## 2023-03-08 DIAGNOSIS — I776 Arteritis, unspecified: Secondary | ICD-10-CM | POA: Diagnosis not present

## 2023-03-08 DIAGNOSIS — I714 Abdominal aortic aneurysm, without rupture, unspecified: Secondary | ICD-10-CM | POA: Diagnosis not present

## 2023-03-08 DIAGNOSIS — I4891 Unspecified atrial fibrillation: Secondary | ICD-10-CM | POA: Diagnosis not present

## 2023-03-08 LAB — COMPREHENSIVE METABOLIC PANEL
ALT: 50 U/L — ABNORMAL HIGH (ref 0–44)
AST: 64 U/L — ABNORMAL HIGH (ref 15–41)
Albumin: 1.8 g/dL — ABNORMAL LOW (ref 3.5–5.0)
Alkaline Phosphatase: 77 U/L (ref 38–126)
Anion gap: 8 (ref 5–15)
BUN: 19 mg/dL (ref 8–23)
CO2: 21 mmol/L — ABNORMAL LOW (ref 22–32)
Calcium: 7.3 mg/dL — ABNORMAL LOW (ref 8.9–10.3)
Chloride: 100 mmol/L (ref 98–111)
Creatinine, Ser: 0.89 mg/dL (ref 0.61–1.24)
GFR, Estimated: >60 mL/min (ref >60)
Glucose, Bld: 182 mg/dL — ABNORMAL HIGH (ref 70–99)
Potassium: 3.7 mmol/L (ref 3.5–5.1)
Sodium: 129 mmol/L — ABNORMAL LOW (ref 135–145)
Total Bilirubin: 0.5 mg/dL (ref 0.3–1.2)
Total Protein: 5.8 g/dL — ABNORMAL LOW (ref 6.5–8.1)

## 2023-03-08 LAB — CBC WITH DIFFERENTIAL/PLATELET
Abs Immature Granulocytes: 0.32 10*3/uL — ABNORMAL HIGH (ref 0.00–0.07)
Basophils Absolute: 0.1 10*3/uL (ref 0.0–0.1)
Basophils Relative: 0 %
Eosinophils Absolute: 0 10*3/uL (ref 0.0–0.5)
Eosinophils Relative: 0 %
HCT: 31.2 % — ABNORMAL LOW (ref 39.0–52.0)
Hemoglobin: 10.7 g/dL — ABNORMAL LOW (ref 13.0–17.0)
Immature Granulocytes: 1 %
Lymphocytes Relative: 2 %
Lymphs Abs: 0.7 10*3/uL (ref 0.7–4.0)
MCH: 31.5 pg (ref 26.0–34.0)
MCHC: 34.3 g/dL (ref 30.0–36.0)
MCV: 91.8 fL (ref 80.0–100.0)
Monocytes Absolute: 0.6 10*3/uL (ref 0.1–1.0)
Monocytes Relative: 2 %
Neutro Abs: 25.9 10*3/uL — ABNORMAL HIGH (ref 1.7–7.7)
Neutrophils Relative %: 95 %
Platelets: 344 10*3/uL (ref 150–400)
RBC: 3.4 MIL/uL — ABNORMAL LOW (ref 4.22–5.81)
RDW: 15.9 % — ABNORMAL HIGH (ref 11.5–15.5)
WBC: 27.5 10*3/uL — ABNORMAL HIGH (ref 4.0–10.5)
nRBC: 0 % (ref 0.0–0.2)

## 2023-03-08 LAB — ANCA PROFILE
Anti-MPO Antibodies: <0.2 units (ref 0.0–0.9)
Anti-PR3 Antibodies: <0.2 units (ref 0.0–0.9)
Atypical P-ANCA titer: <1:20 {titer}
C-ANCA: <1:20 {titer}
P-ANCA: <1:20 {titer}

## 2023-03-08 LAB — ANTINUCLEAR ANTIBODIES, IFA: ANA Ab, IFA: NEGATIVE

## 2023-03-08 LAB — ALDOLASE: Aldolase: 14.1 U/L — ABNORMAL HIGH (ref 3.3–10.3)

## 2023-03-08 LAB — GLUCOSE, CAPILLARY
Glucose-Capillary: 204 mg/dL — ABNORMAL HIGH (ref 70–99)
Glucose-Capillary: 192 mg/dL — ABNORMAL HIGH (ref 70–99)
Glucose-Capillary: 203 mg/dL — ABNORMAL HIGH (ref 70–99)
Glucose-Capillary: 203 mg/dL — ABNORMAL HIGH (ref 70–99)

## 2023-03-08 LAB — CULTURE, BLOOD (ROUTINE X 2): Culture: NO GROWTH

## 2023-03-08 LAB — ANTI-JO 1 ANTIBODY, IGG: Anti JO-1: <0.2 AI (ref 0.0–0.9)

## 2023-03-08 LAB — ANTI-SCLERODERMA ANTIBODY: Scleroderma (Scl-70) (ENA) Antibody, IgG: 0.4 AI (ref 0.0–0.9)

## 2023-03-08 LAB — CYCLIC CITRUL PEPTIDE ANTIBODY, IGG/IGA: CCP Antibodies IgG/IgA: 2 units (ref 0–19)

## 2023-03-08 LAB — SJOGRENS SYNDROME-A EXTRACTABLE NUCLEAR ANTIBODY: SSA (Ro) (ENA) Antibody, IgG: <0.2 AI (ref 0.0–0.9)

## 2023-03-08 LAB — SJOGRENS SYNDROME-B EXTRACTABLE NUCLEAR ANTIBODY: SSB (La) (ENA) Antibody, IgG: <0.2 AI (ref 0.0–0.9)

## 2023-03-08 MED ORDER — PROCHLORPERAZINE EDISYLATE 10 MG/2ML IJ SOLN
10.0000 mg | Freq: Once | INTRAMUSCULAR | Status: AC
  Administered 2023-03-08: 10 mg via INTRAVENOUS
  Filled 2023-03-08: qty 2

## 2023-03-08 MED ORDER — TRAZODONE HCL 50 MG PO TABS
100.0000 mg | ORAL_TABLET | Freq: Every day | ORAL | Status: DC
  Administered 2023-03-08 – 2023-03-12 (×5): 100 mg via ORAL
  Filled 2023-03-08 (×5): qty 2

## 2023-03-08 MED ORDER — ORAL CARE MOUTH RINSE: 15.0000 mL | OROMUCOSAL | Status: DC | PRN

## 2023-03-08 MED ORDER — MELATONIN 3 MG PO TABS
3.0000 mg | ORAL_TABLET | Freq: Every day | ORAL | Status: DC
  Administered 2023-03-08 – 2023-03-22 (×15): 3 mg via ORAL
  Filled 2023-03-08 (×15): qty 1

## 2023-03-08 MED ORDER — SODIUM CHLORIDE 0.9 % IV SOLN: INTRAVENOUS | Status: DC | PRN

## 2023-03-08 MED ORDER — POTASSIUM CHLORIDE CRYS ER 20 MEQ PO TBCR
40.0000 meq | EXTENDED_RELEASE_TABLET | Freq: Once | ORAL | Status: AC
  Administered 2023-03-08: 40 meq via ORAL
  Filled 2023-03-08: qty 2

## 2023-03-08 MED ORDER — INSULIN ASPART 100 UNIT/ML IJ SOLN
0.0000 [IU] | Freq: Three times a day (TID) | INTRAMUSCULAR | Status: DC
  Administered 2023-03-08: 5 [IU] via SUBCUTANEOUS
  Administered 2023-03-08: 3 [IU] via SUBCUTANEOUS
  Administered 2023-03-09: 2 [IU] via SUBCUTANEOUS
  Administered 2023-03-09 (×2): 5 [IU] via SUBCUTANEOUS
  Administered 2023-03-10 (×2): 3 [IU] via SUBCUTANEOUS
  Administered 2023-03-10 – 2023-03-12 (×5): 2 [IU] via SUBCUTANEOUS
  Administered 2023-03-12 (×2): 3 [IU] via SUBCUTANEOUS
  Administered 2023-03-13 (×2): 2 [IU] via SUBCUTANEOUS
  Administered 2023-03-13: 3 [IU] via SUBCUTANEOUS
  Administered 2023-03-14: 2 [IU] via SUBCUTANEOUS
  Administered 2023-03-14: 3 [IU] via SUBCUTANEOUS
  Administered 2023-03-14 – 2023-03-15 (×2): 2 [IU] via SUBCUTANEOUS
  Administered 2023-03-15: 3 [IU] via SUBCUTANEOUS
  Administered 2023-03-15 – 2023-03-16 (×2): 2 [IU] via SUBCUTANEOUS
  Administered 2023-03-16: 3 [IU] via SUBCUTANEOUS
  Administered 2023-03-16: 2 [IU] via SUBCUTANEOUS
  Administered 2023-03-17: 3 [IU] via SUBCUTANEOUS
  Administered 2023-03-17: 2 [IU] via SUBCUTANEOUS
  Administered 2023-03-18: 3 [IU] via SUBCUTANEOUS
  Administered 2023-03-18: 2 [IU] via SUBCUTANEOUS
  Administered 2023-03-18 – 2023-03-20 (×5): 3 [IU] via SUBCUTANEOUS
  Administered 2023-03-20: 2 [IU] via SUBCUTANEOUS
  Administered 2023-03-21: 5 [IU] via SUBCUTANEOUS
  Administered 2023-03-22: 3 [IU] via SUBCUTANEOUS
  Administered 2023-03-23: 2 [IU] via SUBCUTANEOUS

## 2023-03-08 MED ORDER — SENNOSIDES-DOCUSATE SODIUM 8.6-50 MG PO TABS
1.0000 | ORAL_TABLET | Freq: Two times a day (BID) | ORAL | Status: DC
  Administered 2023-03-08 – 2023-03-23 (×22): 1 via ORAL
  Filled 2023-03-08 (×26): qty 1

## 2023-03-08 NOTE — Progress Notes (Signed)
Physical Therapy Treatment Patient Details Name: [name] MRN: [id number] DOB: Apr 29, 1930 Today's Date: 03/08/2023   History of Present Illness 89 yr old male  was admitted with an acute PE at [facility] ED and initially complaining of generalized weakness on 4/7. Patient developed SVT on the morning of 4/8 along with respiratory distress and hypoxia requiring non-re breather mask. WUJ:[id number] rheumatica on chronic steroids, recent hospitalization for aortitis, CHF, chronic leukocytosis, AAA with S/P stent graft in 2018, HLD, and DDD    PT Comments    On entry pt reports that his pastor is coming. PT recommended pt sit up in chair for visit. Pt reports feeling nauseous, but ultimately agrees to getting up to recliner. Pt continues to require increased O2 via HHFNC, and is limited by nausea and generalized weakness. Pt is min A for bed mobility and mod A for stepping transfer to chair. Once up in chair pt with less nausea and is left visiting with his pastor. Pt is making progress towards his goals. PT will continue to follow acutely.    Recommendations for follow up therapy are one component of a multi-disciplinary discharge planning process, led by the attending physician.  Recommendations may be updated based on patient status, additional functional criteria and insurance authorization.  Assistance Recommended at Discharge Frequent or constant Supervision/Assistance  Patient can return home with the following A little help with walking and/or transfers;A little help with bathing/dressing/bathroom;Assistance with cooking/housework;Direct supervision/assist for medications management;Direct supervision/assist for financial management;Assist for transportation;Help with stairs or ramp for entrance   Equipment Recommendations  Other (comment) (TBD)       Precautions / Restrictions Precautions Precautions: Fall Precaution Comments: +DVT R UE.  HHFNC Restrictions Weight Bearing Restrictions:  No     Mobility  Bed Mobility Overal bed mobility: Needs Assistance Bed Mobility: Supine to Sit, Sit to Supine     Supine to sit: Min assist, HOB elevated Sit to supine: Min guard   General bed mobility comments: pt able to walk LE to EoB, continues to require minA for pulling against PT to come to EoB    Transfers Overall transfer level: Needs assistance Equipment used: 1 person hand held assist Transfers: Sit to/from Stand, Bed to chair/wheelchair/BSC Sit to Stand: Min assist           General transfer comment: min A for power up and modA for steadying with stepping to recliner on right    Ambulation/Gait               General Gait Details: deferred due to pt "not getting any sleep last night"         Balance Overall balance assessment: Needs assistance Sitting-balance support: Feet supported, No upper extremity supported Sitting balance-Leahy Scale: Good Sitting balance - Comments: able to sit EoB and brush teeth, reaching outside of his BoS   Standing balance support: During functional activity, Bilateral upper extremity supported Standing balance-Leahy Scale: Poor Standing balance comment: requires outside assist of therapist to maintain balance with dynamic activity                            Cognition Arousal/Alertness: Awake/alert Behavior During Therapy: WFL for tasks assessed/performed, Flat affect Overall Cognitive Status: Within Functional Limits for tasks assessed                                 General Comments:  reports his pastor is coming requires some encouragement to get up to chair for visit        Exercises General Exercises - Lower Extremity Long Arc Quad: AROM, Both, 10 reps, Seated    General Comments General comments (skin integrity, edema, etc.): Pt continues to require 45L O2 via HHF 90%O2, maintaining SpO2 >90%O2, BP 145/101 with sitting up EoB, c/o of nausea which improves with transfer to chair       Pertinent Vitals/Pain Pain Assessment Pain Assessment: Faces Faces Pain Scale: Hurts a little bit Pain Location: gerneralized with movement Pain Intervention(s): Limited activity within patient's tolerance, Monitored during session, Repositioned     PT Goals (current goals can now be found in the care plan section) Acute Rehab PT Goals Patient Stated Goal: get back to work PT Goal Formulation: With patient/family Time For Goal Achievement: 03/21/23 Potential to Achieve Goals: Good Progress towards PT goals: Progressing toward goals    Frequency    Min 1X/week      PT Plan Current plan remains appropriate       AM-PAC PT "6 Clicks" Mobility   Outcome Measure  Help needed turning from your back to your side while in a flat bed without using bedrails?: None Help needed moving from lying on your back to sitting on the side of a flat bed without using bedrails?: A Little Help needed moving to and from a bed to a chair (including a wheelchair)?: A Little Help needed standing up from a chair using your arms (e.g., wheelchair or bedside chair)?: A Lot Help needed to walk in hospital room?: A Little Help needed climbing 3-5 steps with a railing? : A Lot 6 Click Score: 17    End of Session Equipment Utilized During Treatment: Gait belt;Oxygen Activity Tolerance: Patient tolerated treatment well Patient left: in bed;with call bell/phone within reach;with family/visitor present;in chair Nurse Communication: Mobility status PT Visit Diagnosis: Difficulty in walking, not elsewhere classified (R26.2);Other abnormalities of gait and mobility (R26.89);Unsteadiness on feet (R26.81)     Time: 9381-0175 PT Time Calculation (min) (ACUTE ONLY): 32 min  Charges:  $Therapeutic Activity: 23-37 mins                     Chantal Worthey B. Beverely Risen PT, DPT Acute Rehabilitation Services Please use secure chat or  Call Office [phone]    [name] 03/08/2023, 2:19  PM

## 2023-03-08 NOTE — Progress Notes (Signed)
eLink Physician-Brief Progress Note Patient Name: [name] DOB: 09-30-1930 MRN: [id number]   Date of Service  03/08/2023  HPI/Events of Note  Patient with insomnia, he's received multiple different medications already, lights dimmed in the room and noise minimized.  eICU Interventions  Bedside RN instructed to try to find airline-style eye shades.        Thomasene Lot Tamula Morrical 03/08/2023, 1:39 AM

## 2023-03-08 NOTE — Progress Notes (Signed)
[facility] ADULT ICU REPLACEMENT PROTOCOL   The patient does apply for the [facility] Adult ICU Electrolyte Replacment Protocol based on the criteria listed below:   1.Exclusion criteria: TCTS, ECMO, Dialysis, and Myasthenia Gravis patients 2. Is GFR >/= 30 ml/min? Yes.    Patient's GFR today is >60 3. Is SCr </= 2? Yes.   Patient's SCr is 0.89 mg/dL 4. Did SCr increase >/= 0.5 in 24 hours? No. 5.Pt's weight >40kg  Yes.   6. Abnormal electrolyte(s):   K 3.7  7. Electrolytes replaced per protocol 8.  Call MD STAT for K+ </= 2.5, Phos </= 1, or Mag </= 1 Physician:  Shawn Stall R Takiya Belmares 03/08/2023 4:12 AM

## 2023-03-08 NOTE — Progress Notes (Signed)
Subjective:  Still struggling with insomnia due to the steroids but his oxygen requirements have gone down   Antibiotics:  Anti-infectives (From admission, onward)    Start     Dose/Rate Route Frequency Ordered Stop   03/06/23 1715  vancomycin (VANCOREADY) IVPB 1500 mg/300 mL  Status:  Discontinued        1,500 mg 150 mL/hr over 120 Minutes Intravenous  Once 03/06/23 1628 03/06/23 1628   03/06/23 1628  vancomycin (VANCOREADY) IVPB 1250 mg/250 mL        1,250 mg 166.7 mL/hr over 90 Minutes Intravenous Every 24 hours 03/06/23 1628     03/06/23 0900  sulfamethoxazole-trimethoprim (BACTRIM DS) 800-160 MG per tablet 1 tablet        1 tablet Oral Once per day on Mon Wed Fri 03/05/23 2141     03/06/23 0800  ceFEPIme (MAXIPIME) 2 g in sodium chloride 0.9 % 100 mL IVPB        2 g 200 mL/hr over 30 Minutes Intravenous Every 12 hours 03/05/23 2140     03/05/23 1845  DAPTOmycin (CUBICIN) 600 mg in sodium chloride 0.9 % IVPB  Status:  Discontinued        600 mg 124 mL/hr over 30 Minutes Intravenous Daily 03/05/23 1833 03/06/23 1546   03/05/23 1645  ceFEPIme (MAXIPIME) 2 g in sodium chloride 0.9 % 100 mL IVPB        2 g 200 mL/hr over 30 Minutes Intravenous  Once 03/05/23 1639 03/05/23 2018   03/05/23 1645  vancomycin (VANCOREADY) IVPB 1500 mg/300 mL  Status:  Discontinued        1,500 mg 150 mL/hr over 120 Minutes Intravenous  Once 03/05/23 1639 03/05/23 1645       Medications: Scheduled Meds:  Chlorhexidine Gluconate Cloth  6 each Topical Daily   enoxaparin (LOVENOX) injection  80 mg Subcutaneous Q12H   fluorometholone  1 drop Right Eye QHS   folic acid  1 mg Oral Daily   insulin aspart  0-15 Units Subcutaneous TID WC   insulin aspart  0-5 Units Subcutaneous QHS   melatonin  3 mg Oral QHS   methylPREDNISolone (SOLU-MEDROL) injection  125 mg Intravenous Q6H   pantoprazole  40 mg Oral Daily   rosuvastatin  5 mg Oral QHS   senna-docusate  1 tablet Oral BID   sodium  chloride flush  10-40 mL Intracatheter Q12H   sulfamethoxazole-trimethoprim  1 tablet Oral Once per day on Mon Wed Fri   traZODone  100 mg Oral QHS   Continuous Infusions:  ceFEPime (MAXIPIME) IV Stopped (03/08/23 0840)   vancomycin Stopped (03/07/23 1758)   PRN Meds:.acetaminophen **OR** acetaminophen, hydrOXYzine, lidocaine, ondansetron (ZOFRAN) IV, mouth rinse, sodium chloride flush    Objective: Weight change: -0.2 kg  Intake/Output Summary (Last 24 hours) at 03/08/2023 1334 Last data filed at 03/08/2023 1100 Gross per 24 hour  Intake 774.43 ml  Output 1350 ml  Net -575.57 ml    Blood pressure (!) 144/95, pulse 79, temperature (!) 97.4 F (36.3 C), temperature source Oral, resp. rate (!) 32, height 6' (1.829 m), weight 79.8 kg, SpO2 90 %. Temp:  [97.4 F (36.3 C)-98.1 F (36.7 C)] 97.4 F (36.3 C) (04/10 1100) Pulse Rate:  [71-93] 79 (04/10 1316) Resp:  [17-45] 32 (04/10 1316) BP: (121-155)/(66-101) 144/95 (04/10 1316) SpO2:  [75 %-99 %] 90 % (04/10 1316) FiO2 (%):  [70 %-90 %] 75 % (04/10 1316) Weight:  [79.8 kg]  79.8 kg (04/10 0600)  Physical Exam: Physical Exam Constitutional:      Appearance: He is well-developed.  HENT:     Head: Normocephalic and atraumatic.  Eyes:     Conjunctiva/sclera: Conjunctivae normal.  Cardiovascular:     Rate and Rhythm: Regular rhythm. Tachycardia present.     Heart sounds: No murmur heard.    No friction rub. No gallop.  Pulmonary:     Effort: Pulmonary effort is normal. No respiratory distress.     Breath sounds: No stridor. Rhonchi present. No wheezing.  Abdominal:     General: There is no distension.     Palpations: Abdomen is soft.  Musculoskeletal:        General: Normal range of motion.     Cervical back: Normal range of motion and neck supple.  Skin:    General: Skin is warm and dry.     Findings: No erythema or rash.  Neurological:     General: No focal deficit present.     Mental Status: He is alert and  oriented to person, place, and time.  Psychiatric:        Mood and Affect: Mood normal.        Behavior: Behavior normal.        Thought Content: Thought content normal.        Judgment: Judgment normal.      CBC:    BMET Recent Labs    03/07/23 0240 03/08/23 0220  NA 130* 129*  K 3.9 3.7  CL 100 100  CO2 21* 21*  GLUCOSE 187* 182*  BUN 14 19  CREATININE 1.04 0.89  CALCIUM 7.5* 7.3*      Liver Panel  Recent Labs    03/07/23 0240 03/08/23 0220  PROT 5.9* 5.8*  ALBUMIN 1.9* 1.8*  AST 49* 64*  ALT 38 50*  ALKPHOS 81 77  BILITOT 0.3 0.5        Sedimentation Rate Recent Labs    03/06/23 2140  ESRSEDRATE 138*    C-Reactive Protein Recent Labs    03/05/23 1614 03/06/23 2140  CRP 21.8* 29.0*     Micro Results: Recent Results (from the past 720 hour(s))  Culture, blood (routine x 2)     Status: None (Preliminary result)   Collection Time: 03/05/23  4:14 PM   Specimen: BLOOD  Result Value Ref Range Status   Specimen Description BLOOD SITE NOT SPECIFIED  Final   Special Requests   Final    BOTTLES DRAWN AEROBIC AND ANAEROBIC Blood Culture adequate volume   Culture   Final    NO GROWTH 3 DAYS Performed at [facility], [address], [place], [place] [address]    Report Status PENDING  Incomplete  Resp panel by RT-PCR (RSV, Flu A&B, Covid) Anterior Nasal Swab     Status: None   Collection Time: 03/05/23  4:29 PM   Specimen: Anterior Nasal Swab  Result Value Ref Range Status   SARS Coronavirus 2 by RT PCR NEGATIVE NEGATIVE Final   Influenza A by PCR NEGATIVE NEGATIVE Final   Influenza B by PCR NEGATIVE NEGATIVE Final    Comment: (NOTE) The Xpert Xpress SARS-CoV-2/FLU/RSV plus assay is intended as an aid in the diagnosis of influenza from Nasopharyngeal swab specimens and should not be used as a sole basis for treatment. Nasal washings and aspirates are unacceptable for Xpert Xpress SARS-CoV-2/FLU/RSV testing.  Fact Sheet for  Patients: BloggerCourse.com  Fact Sheet for Healthcare Providers: SeriousBroker.it  This test is not yet approved or cleared by the [place] and has been authorized for detection and/or diagnosis of SARS-CoV-2 by FDA under an Emergency Use Authorization (EUA). This EUA will remain in effect (meaning this test can be used) for the duration of the COVID-19 declaration under Section 564(b)(1) of the Act, 21 U.S.C. section 360bbb-3(b)(1), unless the authorization is terminated or revoked.     Resp Syncytial Virus by PCR NEGATIVE NEGATIVE Final    Comment: (NOTE) Fact Sheet for Patients: BloggerCourse.com  Fact Sheet for Healthcare Providers: SeriousBroker.it  This test is not yet approved or cleared by the [place] FDA and has been authorized for detection and/or diagnosis of SARS-CoV-2 by FDA under an Emergency Use Authorization (EUA). This EUA will remain in effect (meaning this test can be used) for the duration of the COVID-19 declaration under Section 564(b)(1) of the Act, 21 U.S.C. section 360bbb-3(b)(1), unless the authorization is terminated or revoked.  Performed at [facility], [address] [place], [place] [address]   Culture, blood (routine x 2)     Status: None (Preliminary result)   Collection Time: 03/05/23  4:33 PM   Specimen: BLOOD LEFT FOREARM  Result Value Ref Range Status   Specimen Description BLOOD LEFT FOREARM  Final   Special Requests   Final    BOTTLES DRAWN AEROBIC AND ANAEROBIC Blood Culture results may not be optimal due to an excessive volume of blood received in culture bottles   Culture   Final    NO GROWTH 3 DAYS Performed at [facility], [address] [place], [place] [address]    Report Status PENDING  Incomplete  MRSA Next Gen by PCR, Nasal     Status: None   Collection Time: 03/06/23  8:34 PM   Specimen:  Nasal Mucosa; Nasal Swab  Result Value Ref Range Status   MRSA by PCR Next Gen NOT DETECTED NOT DETECTED Final    Comment: (NOTE) The GeneXpert MRSA Assay (FDA approved for NASAL specimens only), is one component of a comprehensive MRSA colonization surveillance program. It is not intended to diagnose MRSA infection nor to guide or monitor treatment for MRSA infections. Test performance is not FDA approved in patients less than 55 years old. Performed at [facility], [address], [place], [place] [address]     Studies/Results: VAS [place] UPPER EXTREMITY VENOUS DUPLEX  Result Date: 03/07/2023 UPPER VENOUS STUDY  Patient Name:  HERU MONTZ [facility]  Date of Exam:   03/07/2023 Medical Rec #: [id number]       Accession #:    [id number] Date of Birth: December 28, 1929      Patient Gender: M Patient Age:   89 years Exam Location:  [facility] Procedure:      VAS [place] UPPER EXTREMITY VENOUS DUPLEX Referring Phys: Makhya Arave VAN DAM --------------------------------------------------------------------------------  Indications: pulmonary embolism Anticoagulation: Heparin. Limitations: IV placement, bandages. Comparison Study: No prior study. Performing Technologist: McKayla Maag RVT, [place]  Examination Guidelines: A complete evaluation includes B-mode imaging, spectral Doppler, color Doppler, and power Doppler as needed of all accessible portions of each vessel. Bilateral testing is considered an integral part of a complete examination. Limited examinations for reoccurring indications may be performed as noted.  Right Findings: +----------+------------+---------+-----------+----------+-------+ RIGHT     CompressiblePhasicitySpontaneousPropertiesSummary +----------+------------+---------+-----------+----------+-------+ IJV           Full       Yes       Yes                      +----------+------------+---------+-----------+----------+-------+  Subclavian    Full       Yes       Yes                       +----------+------------+---------+-----------+----------+-------+ Axillary      Full       Yes       Yes                      +----------+------------+---------+-----------+----------+-------+ Brachial      Full       Yes       Yes                      +----------+------------+---------+-----------+----------+-------+ Radial        Full                                          +----------+------------+---------+-----------+----------+-------+ Ulnar         Full                                          +----------+------------+---------+-----------+----------+-------+ Cephalic      Full                                          +----------+------------+---------+-----------+----------+-------+ Basilic       Full                                          +----------+------------+---------+-----------+----------+-------+  Left Findings: +----------+------------+---------+-----------+----------+-------+ LEFT      CompressiblePhasicitySpontaneousPropertiesSummary +----------+------------+---------+-----------+----------+-------+ IJV           Full       Yes       Yes                      +----------+------------+---------+-----------+----------+-------+ Subclavian    Full       Yes       Yes                      +----------+------------+---------+-----------+----------+-------+ Axillary      Full       Yes       Yes                      +----------+------------+---------+-----------+----------+-------+ Brachial      Full       Yes       Yes                      +----------+------------+---------+-----------+----------+-------+ Radial        Full                                          +----------+------------+---------+-----------+----------+-------+ Ulnar         Full                                          +----------+------------+---------+-----------+----------+-------+  Cephalic      Full                                           +----------+------------+---------+-----------+----------+-------+ Basilic       Full                                          +----------+------------+---------+-----------+----------+-------+  Summary: No evidence of deep vein or superficial vein thrombosis involving the right and left upper extremities. Right: However, unable to visualize segments of the brachial veins due to picc line and bandage.  Left: However, unable to visualize segments of the radial and ulnar veins due to IV placement.  *See table(s) above for measurements and observations.  Diagnosing physician: Waverly Ferrari MD Electronically signed by Waverly Ferrari MD on 03/07/2023 at 6:17:24 PM.    Final    VAS [place] LOWER EXTREMITY VENOUS (DVT)  Result Date: 03/07/2023  Lower Venous DVT Study Patient Name:  HILARIO ROBARTS [facility]  Date of Exam:   03/07/2023 Medical Rec #: [id number]       Accession #:    [id number] Date of Birth: 04/23/1930      Patient Gender: M Patient Age:   74 years Exam Location:  [facility] Procedure:      VAS [place] LOWER EXTREMITY VENOUS (DVT) Referring Phys: Elvenia Godden VAN DAM --------------------------------------------------------------------------------  Indications: Pulmonary embolism.  Anticoagulation: Heparin. Comparison Study: No prior study. Performing Technologist: McKayla Maag RVT, [place]  Examination Guidelines: A complete evaluation includes B-mode imaging, spectral Doppler, color Doppler, and power Doppler as needed of all accessible portions of each vessel. Bilateral testing is considered an integral part of a complete examination. Limited examinations for reoccurring indications may be performed as noted. The reflux portion of the exam is performed with the patient in reverse Trendelenburg.  +---------+---------------+---------+-----------+----------+--------------+ RIGHT    CompressibilityPhasicitySpontaneityPropertiesThrombus Aging  +---------+---------------+---------+-----------+----------+--------------+ CFV      Full           Yes      Yes                                 +---------+---------------+---------+-----------+----------+--------------+ SFJ      Full                                                        +---------+---------------+---------+-----------+----------+--------------+ FV Prox  Full                                                        +---------+---------------+---------+-----------+----------+--------------+ FV Mid   Full                                                        +---------+---------------+---------+-----------+----------+--------------+ FV DistalFull                                                        +---------+---------------+---------+-----------+----------+--------------+  PFV      Full                                                        +---------+---------------+---------+-----------+----------+--------------+ POP      Full           Yes      Yes                                 +---------+---------------+---------+-----------+----------+--------------+ PTV      Full                                                        +---------+---------------+---------+-----------+----------+--------------+ PERO     Partial        Yes      Yes                  Acute          +---------+---------------+---------+-----------+----------+--------------+   +---------+---------------+---------+-----------+----------+--------------+ LEFT     CompressibilityPhasicitySpontaneityPropertiesThrombus Aging +---------+---------------+---------+-----------+----------+--------------+ CFV      Full           Yes      Yes                                 +---------+---------------+---------+-----------+----------+--------------+ SFJ      Full                                                         +---------+---------------+---------+-----------+----------+--------------+ FV Prox  Full                                                        +---------+---------------+---------+-----------+----------+--------------+ FV Mid   Full                                                        +---------+---------------+---------+-----------+----------+--------------+ FV DistalFull                                                        +---------+---------------+---------+-----------+----------+--------------+ PFV      Full                                                        +---------+---------------+---------+-----------+----------+--------------+  POP      Full           Yes      Yes                                 +---------+---------------+---------+-----------+----------+--------------+ PTV      Full                                                        +---------+---------------+---------+-----------+----------+--------------+ PERO     Full                                                        +---------+---------------+---------+-----------+----------+--------------+     Summary: RIGHT: - Findings consistent with acute deep vein thrombosis involving the right peroneal veins. - No cystic structure found in the popliteal fossa.  LEFT: - There is no evidence of deep vein thrombosis in the lower extremity.  - No cystic structure found in the popliteal fossa.  *See table(s) above for measurements and observations. Electronically signed by Waverly Ferrarihristopher Dickson MD on 03/07/2023 at 6:16:33 PM.    Final    DG CHEST PORT 1 VIEW  Result Date: 03/07/2023 CLINICAL DATA:  Dyspnea. EXAM: PORTABLE CHEST 1 VIEW COMPARISON:  03/05/2023 FINDINGS: The cardio pericardial silhouette is enlarged. Diffuse interstitial opacity again noted with progression of peripheral patchy areas of airspace opacity bilaterally. No pleural effusion. Right PICC line tip overlies the SVC/RA junction.  Telemetry leads overlie the chest. Peripheral IMPRESSION: Diffuse interstitial opacity with progression of peripheral patchy areas of airspace opacity bilaterally suggesting multifocal pneumonia. Electronically Signed   By: Kennith CenterEric  Mansell M.D.   On: 03/07/2023 08:16      Assessment/Plan:  INTERVAL HISTORY: fio2 was down to 70%   Principal Problem:   Acute pulmonary embolism Active Problems:   Hyperlipidemia   PMR (polymyalgia rheumatica)   AAA (abdominal aortic aneurysm) without rupture (HCC)   Generalized weakness   Acute hypoxic respiratory failure   Acute hyponatremia   Elevated troponin   Lactic acidosis   HCAP (healthcare-associated pneumonia)   Chronic diastolic CHF (congestive heart failure)   BPH (benign prostatic hyperplasia)   Pulmonary embolism   Eosinophilic pneumonia   Aortitis   Atrial flutter   FUO (fever of unknown origin)   S/P insertion of endovascular thoracic aortic stent graft   Drug-induced insomnia   Anxiety   DVT, lower extremity, distal, acute, right   PAF (paroxysmal atrial fibrillation)    Milda Smartdwin S Boback is a 89 y.o. male with complicated medical history including aortic aneurysm status post placement of stent graft, polymyalgia rheumatica that has been well-controlled on prednisone who has been experiencing malaise for nearly 8 months with intermittent fevers and elevated inflammatory markers, who was [facility] up for this condition [facility] including PET scan that showed increased uptake in the aorta near stent graft with question of aortitis.  The tagged white cell scan subsequently did not show uptake in this area or anywhere else in the body.  I reviewed the case with Dr.Julia Dolores FrameSung yesterday over the phone  and she stated that they had reviewed the PET scan again with radiology and that radiology is not entirely convinced of the uptake being particularly dramatic.  Regardless the patient was the begun on empiric  antibiotic regimen for possible bacterial infectious aortitis with daptomycin and ceftriaxone.  The plan was to see how the patient did on this including monitoring his inflammatory markers.  All along however there has been suspicion that there might be an autoimmune process involved and the patient is followed closely by Dr. Alberteen Spindle with nephrology at [facility].  He has now been admitted to [facility] with an acute pulmonary embolism and a CT angiogram that also shows multifocal infiltrates.  He is experienced progressive hypoxemic respiratory failure requiring high flow oxygen currently on high flow oxygen via nasal cannula.  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  His antibiotics currently are vancomycin and cefepime having been moved from daptomycin ceftriaxone disease.  #1  Pulmonary embolism with heart strain and DVT identified in the right lower extremity  On anticoagulation  #2 pneumonia: favor eosinophilic PNA vs perhaps more likely declaration of autoimmune pulmonary pathology  Broadly covered for bacterial pathogens with vancomycin and cefepime  He is also on Solu-Medrol 125 mg every 6 hours.  #3 ? Aortitis: I would plan on him continuing with antibiotics to complete 6 weeks that I would not go back to daptomycin in case it was causing eosinophilic pneumonia and I would prefer doxycycline orally to systemic vancomycin at discharge.  We will hold off on, with a final antibiotic plan at this time.  I have my suspicion is that the aortitis is a " "red herring" he has a different underlying autoimmune pathology that was responsible for his elevated inflammatory markers  #4  Suspected underlying autoimmune disease potentially with pulmonary complications and patient with known polymyalgia rheumatica:  He is on  corticosteroids and will be interesting to see if he improves globally if he assists out of the ICU and onto the floor and ultimately back home.    #5 nsomnia: Trazodone being increased I would hazard to guess that benzodiazepines or not being used given his tenuous respiratory status.   #6  Atrial  fibrillation: Currently in sinus rhythm after amiodarone  #7 Code status: he and his wife have been discussing whether or not he would want to undergo intubation and mechanical ventilation during this hospitalization.  I have emphasized to him that any intubation is not a "condemnation" to being on the ventilator though we would like to avoid this. I have counseled he continue his discussions with his wife and 2 sons and family members along with ICU team with regards to his goals of care  and level of aggressive care. He very much wants to get back to   I have personally spent 54 minutes involved in face-to-face and non-face-to-face activities for this patient on the day of the visit. Professional time spent includes the following activities: Preparing to see the patient (review of tests), Obtaining and/or reviewing separately obtained history (admission/discharge record), Performing a medically appropriate examination and/or evaluation , Ordering medications/tests/procedures, referring and communicating with other health care professionals, Documenting clinical information in the EMR, Independently interpreting results (not separately reported), Communicating results to the patient/family/caregiver, Counseling and educating the patient/family/caregiver and Care coordination (not separately reported).        LOS: 2 days  Acey Lav 03/08/2023, 1:34 PM

## 2023-03-08 NOTE — Progress Notes (Signed)
NAME:  [name], MRN:  [id number], DOB:  1930-10-07, LOS: 2 ADMISSION DATE:  03/05/2023, CONSULTATION DATE: 4/8  REFERRING MD:  Daiva Eves CHIEF COMPLAINT:  Generalized Weakness   History of Present Illness:  89 yr old male with significant pmhx of polymyalgia rheumatica on chronic steroids, recent hospitalization for aortitis, CHF, chronic leukocytosis, AAA with S/P stent graft in 2018, HLD, and DDD was admitted with an acute PE at [facility] ED and initially complaining of generalized weakness on 4/7. Patient developed SVT on the morning of 4/8 along with respiratory distress and hypoxia requiring non-re breather mask. Patient being managed by [facility], Cardiology, and Infectious Disease. PCCM consulted by ID to assist in managing the acute hypoxic respiratory failure.   Pertinent  Medical History   Past Medical History:  Diagnosis Date   AAA (abdominal aortic aneurysm)    Abnormal EKG    Left atrial abnormality   Allergic rhinitis    Allergy    Benign neoplasm of colon 04/14/10   3 small polyps on colonoscopy by Dr. Marina Goodell   Carpal tunnel syndrome    Cataract    Degenerative disc disease    Disturbances metabolism of methionine, homocystine, and cystathionine    Elevated homocysteine   Elevated prostate specific antigen (PSA)    Hearing loss    Hyperlipidemia    Impotence of organic origin    Penile implant   Internal hemorrhoids    Neck pain    Otosclerosis of both ears    Peripheral vascular disease    Bilateral femoral bruit   Plantar fasciitis    Polymyalgia rheumatica    Rotator cuff syndrome of left shoulder    Scoliosis    Shoulder pain      Significant Hospital Events: Including procedures, antibiotic start and stop dates in addition to other pertinent events   4/7 Admitted for Acute PE 4/8 Developed Rapid Atrial Flutter-HR 170s-180s , along with respiratory failure requiring NRB, PCCM consulted   Interim History / Subjective:  Patient stated could not sleep overnight  despite multiple meds Remains on high flow nasal cannula oxygen, FiO2 was titrated down to 70%, remained on 45 L   Remain in sinus rhythm  Objective   Blood pressure 138/75, pulse 81, temperature 97.7 F (36.5 C), temperature source Oral, resp. rate (!) 26, height 6' (1.829 m), weight 79.8 kg, SpO2 94 %.    FiO2 (%):  [70 %-90 %] 70 %   Intake/Output Summary (Last 24 hours) at 03/08/2023 3013 Last data filed at 03/08/2023 0601 Gross per 24 hour  Intake 594.6 ml  Output 1350 ml  Net -755.4 ml   Filed Weights   03/06/23 0624 03/07/23 0440 03/08/23 0600  Weight: 77.1 kg 80 kg 79.8 kg    Examination: Physical exam: General: Acute on chronically ill-appearing male, lying on the bed HEENT: [place]/AT, eyes anicteric.  moist mucus membranes Neuro: Alert, awake following commands Chest: Tachypneic, bilateral faint basal crackles, no wheezes or rhonchi Heart: Regular rate and rhythm, no murmurs or gallops Abdomen: Soft, nontender, nondistended, bowel sounds present Skin: No rash  Labs and images were reviewed  Resolved Hospital Problem list   N/a   Assessment & Plan:  Acute Hypoxic Respiratory Failure in setting possibly of multifocal pna vs eosinophilic pna versus organizing pneumonia Acute PE  PMR on chronic prednisone, immunocompromised  Continue high flow nasal cannula oxygen, FiO2 was titrated down to 70% still remain on 45 L Continue to titrate with O2 sat goal  92% Continue IV antibiotics, per discussion with infectious disease currently on vancomycin and cefepime Continue high-dose steroid with Solu-Medrol 125 mg every 6 hours with suspected inflammatory lung disease for total of 5 days followed by slow taper Autoimmune panel is pending CRP is elevated His procalcitonin is slightly elevated so infectious etiology is possible Ideally patient needs bronchoscopy with BAL and transbronchial biopsy but due to high oxygen requirement, will currently hold off Continue subcu Lovenox  therapeutic dose Monitor intake and output  Paroxysmal atrial flutter/atrial fibrillation Patient remained in sinus rhythm Will stop amiodarone to prevent further complication with ILD On therapeutic anticoagulation Echocardiogram showed normal EF with no wall motion abnormalities and diastolic parameters are normal, HFpEF was ruled out  Hyperlipidemia Continue Crestor  BPH Continue tamsulosin  AAA s/p aortic graft Possible aortitis Continue antibiotics, follows at [facility]  Hyponatremia Closely monitor electrolytes  Prediabetes Patient hemoglobin A1c 6.3 Continue sliding scale insulin with CBG goal 140-180  Best Practice (right click and "Reselect all SmartList Selections" daily)   Diet/type: Regular  DVT prophylaxis: systemic dose LMWH GI prophylaxis: PPI Lines: N/A Foley:  N/A Code Status:  full code Last date of multidisciplinary goals of care discussion [4/10: Patient, his son and and wife updated at beside, decision was to continue full scope of care].  He would like to go for ventilator if needed as last resort for short-term only   This patient is critically ill with multiple organ system failure which requires frequent high complexity decision making, assessment, support, evaluation, and titration of therapies. This was completed through the application of advanced monitoring technologies and extensive interpretation of multiple databases.  During this encounter critical care time was devoted to patient care services described in this note for 35 minutes.    Cheri Fowler, MD [place] [facility] See Amion for pager If no response to pager, please call [phone] until 7pm After 7pm, Please call E-link [phone]

## 2023-03-09 ENCOUNTER — Inpatient Hospital Stay (HOSPITAL_COMMUNITY): Payer: Medicare Other

## 2023-03-09 DIAGNOSIS — M353 Polymyalgia rheumatica: Secondary | ICD-10-CM | POA: Diagnosis not present

## 2023-03-09 DIAGNOSIS — I4891 Unspecified atrial fibrillation: Secondary | ICD-10-CM | POA: Diagnosis not present

## 2023-03-09 DIAGNOSIS — I824Z1 Acute embolism and thrombosis of unspecified deep veins of right distal lower extremity: Secondary | ICD-10-CM | POA: Diagnosis not present

## 2023-03-09 DIAGNOSIS — I2699 Other pulmonary embolism without acute cor pulmonale: Secondary | ICD-10-CM | POA: Diagnosis not present

## 2023-03-09 DIAGNOSIS — J189 Pneumonia, unspecified organism: Secondary | ICD-10-CM | POA: Diagnosis not present

## 2023-03-09 DIAGNOSIS — I2609 Other pulmonary embolism with acute cor pulmonale: Secondary | ICD-10-CM | POA: Diagnosis not present

## 2023-03-09 DIAGNOSIS — I714 Abdominal aortic aneurysm, without rupture, unspecified: Secondary | ICD-10-CM | POA: Diagnosis not present

## 2023-03-09 LAB — COMPREHENSIVE METABOLIC PANEL
ALT: 63 U/L — ABNORMAL HIGH (ref 0–44)
AST: 60 U/L — ABNORMAL HIGH (ref 15–41)
Albumin: 1.8 g/dL — ABNORMAL LOW (ref 3.5–5.0)
Alkaline Phosphatase: 81 U/L (ref 38–126)
Anion gap: 10 (ref 5–15)
BUN: 22 mg/dL (ref 8–23)
CO2: 19 mmol/L — ABNORMAL LOW (ref 22–32)
Calcium: 7.5 mg/dL — ABNORMAL LOW (ref 8.9–10.3)
Chloride: 103 mmol/L (ref 98–111)
Creatinine, Ser: 0.83 mg/dL (ref 0.61–1.24)
GFR, Estimated: >60 mL/min (ref >60)
Glucose, Bld: 175 mg/dL — ABNORMAL HIGH (ref 70–99)
Potassium: 3.9 mmol/L (ref 3.5–5.1)
Sodium: 132 mmol/L — ABNORMAL LOW (ref 135–145)
Total Bilirubin: 0.4 mg/dL (ref 0.3–1.2)
Total Protein: 5.6 g/dL — ABNORMAL LOW (ref 6.5–8.1)

## 2023-03-09 LAB — CBC WITH DIFFERENTIAL/PLATELET
Abs Immature Granulocytes: 0.4 10*3/uL — ABNORMAL HIGH (ref 0.00–0.07)
Basophils Absolute: 0 10*3/uL (ref 0.0–0.1)
Basophils Relative: 0 %
Eosinophils Absolute: 0 10*3/uL (ref 0.0–0.5)
Eosinophils Relative: 0 %
HCT: 30.3 % — ABNORMAL LOW (ref 39.0–52.0)
Hemoglobin: 10.2 g/dL — ABNORMAL LOW (ref 13.0–17.0)
Immature Granulocytes: 1 %
Lymphocytes Relative: 2 %
Lymphs Abs: 0.4 10*3/uL — ABNORMAL LOW (ref 0.7–4.0)
MCH: 31 pg (ref 26.0–34.0)
MCHC: 33.7 g/dL (ref 30.0–36.0)
MCV: 92.1 fL (ref 80.0–100.0)
Monocytes Absolute: 0.6 10*3/uL (ref 0.1–1.0)
Monocytes Relative: 2 %
Neutro Abs: 26.4 10*3/uL — ABNORMAL HIGH (ref 1.7–7.7)
Neutrophils Relative %: 95 %
Platelets: 364 10*3/uL (ref 150–400)
RBC: 3.29 MIL/uL — ABNORMAL LOW (ref 4.22–5.81)
RDW: 15.9 % — ABNORMAL HIGH (ref 11.5–15.5)
WBC: 27.8 10*3/uL — ABNORMAL HIGH (ref 4.0–10.5)
nRBC: 0 % (ref 0.0–0.2)

## 2023-03-09 LAB — GLUCOSE, CAPILLARY
Glucose-Capillary: 145 mg/dL — ABNORMAL HIGH (ref 70–99)
Glucose-Capillary: 206 mg/dL — ABNORMAL HIGH (ref 70–99)
Glucose-Capillary: 230 mg/dL — ABNORMAL HIGH (ref 70–99)
Glucose-Capillary: 124 mg/dL — ABNORMAL HIGH (ref 70–99)

## 2023-03-09 LAB — MAGNESIUM: Magnesium: 2.5 mg/dL — ABNORMAL HIGH (ref 1.7–2.4)

## 2023-03-09 LAB — C4 COMPLEMENT: Complement C4, Body Fluid: 28 mg/dL (ref 12–38)

## 2023-03-09 LAB — C3 COMPLEMENT: C3 Complement: 163 mg/dL (ref 82–167)

## 2023-03-09 LAB — RHEUMATOID FACTOR: Rheumatoid fact SerPl-aCnc: 26 IU/mL — ABNORMAL HIGH (ref <14.0)

## 2023-03-09 LAB — CULTURE, BLOOD (ROUTINE X 2): Culture: NO GROWTH

## 2023-03-09 MED ORDER — METHYLPREDNISOLONE SODIUM SUCC 40 MG IJ SOLR
40.0000 mg | Freq: Two times a day (BID) | INTRAMUSCULAR | Status: DC
  Administered 2023-03-09 (×2): 40 mg via INTRAVENOUS
  Filled 2023-03-09 (×2): qty 1

## 2023-03-09 MED ORDER — FUROSEMIDE 10 MG/ML IJ SOLN
40.0000 mg | Freq: Once | INTRAMUSCULAR | Status: AC
  Administered 2023-03-09: 40 mg via INTRAVENOUS
  Filled 2023-03-09: qty 4

## 2023-03-09 MED ORDER — METHYLPREDNISOLONE SODIUM SUCC 125 MG IJ SOLR: 80.0000 mg | Freq: Every day | INTRAMUSCULAR | Status: DC

## 2023-03-09 NOTE — Progress Notes (Signed)
NAME:  [name], MRN:  [id number], DOB:  29-Dec-1929, LOS: 3 ADMISSION DATE:  03/05/2023, CONSULTATION DATE: 4/8  REFERRING MD:  Daiva Eves CHIEF COMPLAINT:  Generalized Weakness   History of Present Illness:  89 yr old male with significant pmhx of polymyalgia rheumatica on chronic steroids, recent hospitalization for aortitis, CHF, chronic leukocytosis, AAA with S/P stent graft in 2018, HLD, and DDD was admitted with an acute PE at [facility] ED and initially complaining of generalized weakness on 4/7. Patient developed SVT on the morning of 4/8 along with respiratory distress and hypoxia requiring non-re breather mask. Patient being managed by [facility], Cardiology, and Infectious Disease. PCCM consulted by ID to assist in managing the acute hypoxic respiratory failure.   Pertinent  Medical History   Past Medical History:  Diagnosis Date   AAA (abdominal aortic aneurysm)    Abnormal EKG    Left atrial abnormality   Allergic rhinitis    Allergy    Benign neoplasm of colon 04/14/10   3 small polyps on colonoscopy by Dr. Marina Goodell   Carpal tunnel syndrome    Cataract    Degenerative disc disease    Disturbances metabolism of methionine, homocystine, and cystathionine    Elevated homocysteine   Elevated prostate specific antigen (PSA)    Hearing loss    Hyperlipidemia    Impotence of organic origin    Penile implant   Internal hemorrhoids    Neck pain    Otosclerosis of both ears    Peripheral vascular disease    Bilateral femoral bruit   Plantar fasciitis    Polymyalgia rheumatica    Rotator cuff syndrome of left shoulder    Scoliosis    Shoulder pain      Significant Hospital Events: Including procedures, antibiotic start and stop dates in addition to other pertinent events   4/7 Admitted for Acute PE 4/8 Developed Rapid Atrial Flutter-HR 170s-180s , along with respiratory failure requiring NRB, PCCM consulted   Interim History / Subjective:  Patient stated he was able to sleep about 6  hours overnight Stated feels better FiO2 was titrated down to 60%, remain at 45 L He is afebrile  Objective   Blood pressure (!) 156/88, pulse 95, temperature 97.9 F (36.6 C), temperature source Oral, resp. rate 15, height 6' (1.829 m), weight 79.8 kg, SpO2 94 %.    FiO2 (%):  [75 %-85 %] 85 %   Intake/Output Summary (Last 24 hours) at 03/09/2023 0958 Last data filed at 03/09/2023 0900 Gross per 24 hour  Intake 944.13 ml  Output 1900 ml  Net -955.87 ml   Filed Weights   03/06/23 0624 03/07/23 0440 03/08/23 0600  Weight: 77.1 kg 80 kg 79.8 kg    Examination: Physical exam: General: Elderly male, lying on the bed HEENT: [place]/AT, eyes anicteric.  moist mucus membranes.  On high flow nasal cannula oxygen Neuro: Alert, awake following commands Chest: Coarse breath sounds, no wheezes or rhonchi Heart: Regular rate and rhythm, no murmurs or gallops Abdomen: Soft, nontender, nondistended, bowel sounds present Skin: No rash  Labs and images were reviewed  Resolved Hospital Problem list   N/a   Assessment & Plan:  Acute Hypoxic Respiratory Failure in setting possibly of multifocal pna vs eosinophilic pna versus organizing pneumonia Acute PE  PMR on chronic prednisone, immunocompromised  FiO2 requirement is coming down, currently at 60% and 45 L Overnight his FiO2 went up to 85%  Continue to titrate with O2 sat goal 92%  Continue IV antibiotics, vancomycin and cefepime per discussion with ID Received 3 days of high-dose steroid 125 mg every 6 hours for suspected inflammatory lung disease Now steroids were tapered to 1 mg/kg in 2 divided doses Autoimmune panel is pending CRP is elevated His procalcitonin is slightly elevated so infectious etiology is possible Ideally patient needs bronchoscopy with BAL and transbronchial biopsy but due to high oxygen requirement, will currently hold off Continue subcu Lovenox therapeutic dose Monitor intake and output  Paroxysmal atrial  flutter/atrial fibrillation Patient remained in sinus rhythm He is off amiodarone On therapeutic anticoagulation with Lovenox Echocardiogram showed normal EF with no wall motion abnormalities and diastolic parameters are normal, HFpEF was ruled out  Hyperlipidemia Continue Crestor  BPH Continue tamsulosin  AAA s/p aortic graft Possible aortitis Continue antibiotics, follows at [facility]  Hyponatremia Closely monitor electrolytes  Prediabetes Patient hemoglobin A1c 6.3 Continue sliding scale insulin with CBG goal 140-180  Anxiety disorder/insomnia Last night patient was able to sleep for 6 hours Continue trazodone, hydroxyzine and melatonin  Best Practice (right click and "Reselect all SmartList Selections" daily)   Diet/type: Regular  DVT prophylaxis: systemic dose LMWH GI prophylaxis: PPI Lines: N/A Foley:  N/A Code Status:  full code Last date of multidisciplinary goals of care discussion [4/11: Patient, his son  updated at beside, decision was to continue full scope of care].  He would like to go for ventilator if needed as last resort for short-term only   This patient is critically ill with multiple organ system failure which requires frequent high complexity decision making, assessment, support, evaluation, and titration of therapies. This was completed through the application of advanced monitoring technologies and extensive interpretation of multiple databases.  During this encounter critical care time was devoted to patient care services described in this note for 33 minutes.    Cheri Fowler, MD [place] [facility] See Amion for pager If no response to pager, please call [phone] until 7pm After 7pm, Please call E-link [phone]

## 2023-03-09 NOTE — Progress Notes (Signed)
Subjective:  Slept the best he has since he has been here in the ICU   Antibiotics:  Anti-infectives (From admission, onward)    Start     Dose/Rate Route Frequency Ordered Stop   03/06/23 1715  vancomycin (VANCOREADY) IVPB 1500 mg/300 mL  Status:  Discontinued        1,500 mg 150 mL/hr over 120 Minutes Intravenous  Once 03/06/23 1628 03/06/23 1628   03/06/23 1628  vancomycin (VANCOREADY) IVPB 1250 mg/250 mL        1,250 mg 166.7 mL/hr over 90 Minutes Intravenous Every 24 hours 03/06/23 1628     03/06/23 0900  sulfamethoxazole-trimethoprim (BACTRIM DS) 800-160 MG per tablet 1 tablet        1 tablet Oral Once per day on Mon Wed Fri 03/05/23 2141     03/06/23 0800  ceFEPIme (MAXIPIME) 2 g in sodium chloride 0.9 % 100 mL IVPB        2 g 200 mL/hr over 30 Minutes Intravenous Every 12 hours 03/05/23 2140     03/05/23 1845  DAPTOmycin (CUBICIN) 600 mg in sodium chloride 0.9 % IVPB  Status:  Discontinued        600 mg 124 mL/hr over 30 Minutes Intravenous Daily 03/05/23 1833 03/06/23 1546   03/05/23 1645  ceFEPIme (MAXIPIME) 2 g in sodium chloride 0.9 % 100 mL IVPB        2 g 200 mL/hr over 30 Minutes Intravenous  Once 03/05/23 1639 03/05/23 2018   03/05/23 1645  vancomycin (VANCOREADY) IVPB 1500 mg/300 mL  Status:  Discontinued        1,500 mg 150 mL/hr over 120 Minutes Intravenous  Once 03/05/23 1639 03/05/23 1645       Medications: Scheduled Meds:  Chlorhexidine Gluconate Cloth  6 each Topical Daily   enoxaparin (LOVENOX) injection  80 mg Subcutaneous Q12H   fluorometholone  1 drop Right Eye QHS   folic acid  1 mg Oral Daily   insulin aspart  0-15 Units Subcutaneous TID WC   insulin aspart  0-5 Units Subcutaneous QHS   melatonin  3 mg Oral QHS   methylPREDNISolone (SOLU-MEDROL) injection  40 mg Intravenous Q12H   pantoprazole  40 mg Oral Daily   rosuvastatin  5 mg Oral QHS   senna-docusate  1 tablet Oral BID   sodium chloride flush  10-40 mL Intracatheter Q12H    sulfamethoxazole-trimethoprim  1 tablet Oral Once per day on Mon Wed Fri   traZODone  100 mg Oral QHS   Continuous Infusions:  sodium chloride 10 mL/hr at 03/09/23 1000   ceFEPime (MAXIPIME) IV Stopped (03/09/23 0851)   vancomycin Stopped (03/08/23 1720)   PRN Meds:.sodium chloride, acetaminophen **OR** acetaminophen, hydrOXYzine, lidocaine, ondansetron (ZOFRAN) IV, mouth rinse, sodium chloride flush    Objective: Weight change:   Intake/Output Summary (Last 24 hours) at 03/09/2023 1136 Last data filed at 03/09/2023 1030 Gross per 24 hour  Intake 829.32 ml  Output 2300 ml  Net -1470.68 ml    Blood pressure (!) 141/82, pulse 98, temperature 98.4 F (36.9 C), temperature source Oral, resp. rate (!) 33, height 6' (1.829 m), weight 79.8 kg, SpO2 90 %. Temp:  [97.7 F (36.5 C)-98.4 F (36.9 C)] 98.4 F (36.9 C) (04/11 1100) Pulse Rate:  [73-138] 98 (04/11 1000) Resp:  [15-35] 33 (04/11 1000) BP: (111-156)/(68-112) 141/82 (04/11 1000) SpO2:  [85 %-96 %] 90 % (04/11 1000) FiO2 (%):  [60 %-85 %] 60 % (  04/11 1000)  Physical Exam: Physical Exam Constitutional:      Appearance: He is well-developed.  HENT:     Head: Normocephalic and atraumatic.  Eyes:     Extraocular Movements: Extraocular movements intact.     Conjunctiva/sclera: Conjunctivae normal.     Pupils: Pupils are equal, round, and reactive to light.  Cardiovascular:     Rate and Rhythm: Regular rhythm. Tachycardia present.  Pulmonary:     Effort: Pulmonary effort is normal. No respiratory distress.     Breath sounds: No wheezing.  Abdominal:     General: There is no distension.     Palpations: Abdomen is soft.  Musculoskeletal:        General: Normal range of motion.     Cervical back: Normal range of motion and neck supple.  Skin:    General: Skin is warm and dry.     Findings: No erythema or rash.  Neurological:     General: No focal deficit present.     Mental Status: He is alert and oriented to  person, place, and time.  Psychiatric:        Mood and Affect: Mood normal.        Behavior: Behavior normal.        Thought Content: Thought content normal.        Judgment: Judgment normal.      CBC:    BMET Recent Labs    03/08/23 0220 03/09/23 0143  NA 129* 132*  K 3.7 3.9  CL 100 103  CO2 21* 19*  GLUCOSE 182* 175*  BUN 19 22  CREATININE 0.89 0.83  CALCIUM 7.3* 7.5*      Liver Panel  Recent Labs    03/08/23 0220 03/09/23 0143  PROT 5.8* 5.6*  ALBUMIN 1.8* 1.8*  AST 64* 60*  ALT 50* 63*  ALKPHOS 77 81  BILITOT 0.5 0.4        Sedimentation Rate Recent Labs    03/06/23 2140  ESRSEDRATE 138*    C-Reactive Protein Recent Labs    03/06/23 2140  CRP 29.0*     Micro Results: Recent Results (from the past 720 hour(s))  Culture, blood (routine x 2)     Status: None (Preliminary result)   Collection Time: 03/05/23  4:14 PM   Specimen: BLOOD  Result Value Ref Range Status   Specimen Description BLOOD SITE NOT SPECIFIED  Final   Special Requests   Final    BOTTLES DRAWN AEROBIC AND ANAEROBIC Blood Culture adequate volume   Culture   Final    NO GROWTH 4 DAYS Performed at [facility], [address] [place], [place] [address]    Report Status PENDING  Incomplete  Resp panel by RT-PCR (RSV, Flu A&B, Covid) Anterior Nasal Swab     Status: None   Collection Time: 03/05/23  4:29 PM   Specimen: Anterior Nasal Swab  Result Value Ref Range Status   SARS Coronavirus 2 by RT PCR NEGATIVE NEGATIVE Final   Influenza A by PCR NEGATIVE NEGATIVE Final   Influenza B by PCR NEGATIVE NEGATIVE Final    Comment: (NOTE) The Xpert Xpress SARS-CoV-2/FLU/RSV plus assay is intended as an aid in the diagnosis of influenza from Nasopharyngeal swab specimens and should not be used as a sole basis for treatment. Nasal washings and aspirates are unacceptable for Xpert Xpress SARS-CoV-2/FLU/RSV testing.  Fact Sheet for  Patients: BloggerCourse.com  Fact Sheet for Healthcare Providers: SeriousBroker.it  This test is not  yet approved or cleared by the [place] and has been authorized for detection and/or diagnosis of SARS-CoV-2 by FDA under an Emergency Use Authorization (EUA). This EUA will remain in effect (meaning this test can be used) for the duration of the COVID-19 declaration under Section 564(b)(1) of the Act, 21 U.S.C. section 360bbb-3(b)(1), unless the authorization is terminated or revoked.     Resp Syncytial Virus by PCR NEGATIVE NEGATIVE Final    Comment: (NOTE) Fact Sheet for Patients: BloggerCourse.com  Fact Sheet for Healthcare Providers: SeriousBroker.it  This test is not yet approved or cleared by the [place] FDA and has been authorized for detection and/or diagnosis of SARS-CoV-2 by FDA under an Emergency Use Authorization (EUA). This EUA will remain in effect (meaning this test can be used) for the duration of the COVID-19 declaration under Section 564(b)(1) of the Act, 21 U.S.C. section 360bbb-3(b)(1), unless the authorization is terminated or revoked.  Performed at [facility], [address], [place], [place] [address]   Culture, blood (routine x 2)     Status: None (Preliminary result)   Collection Time: 03/05/23  4:33 PM   Specimen: BLOOD LEFT FOREARM  Result Value Ref Range Status   Specimen Description BLOOD LEFT FOREARM  Final   Special Requests   Final    BOTTLES DRAWN AEROBIC AND ANAEROBIC Blood Culture results may not be optimal due to an excessive volume of blood received in culture bottles   Culture   Final    NO GROWTH 4 DAYS Performed at [facility], [address] [place], [place] [address]    Report Status PENDING  Incomplete  MRSA Next Gen by PCR, Nasal     Status: None   Collection Time: 03/06/23  8:34 PM   Specimen:  Nasal Mucosa; Nasal Swab  Result Value Ref Range Status   MRSA by PCR Next Gen NOT DETECTED NOT DETECTED Final    Comment: (NOTE) The GeneXpert MRSA Assay (FDA approved for NASAL specimens only), is one component of a comprehensive MRSA colonization surveillance program. It is not intended to diagnose MRSA infection nor to guide or monitor treatment for MRSA infections. Test performance is not FDA approved in patients less than 65 years old. Performed at [facility], [address] [place], [place] [address]     Studies/Results: DG CHEST PORT 1 VIEW  Result Date: 03/09/2023 CLINICAL DATA:  Respiratory failure EXAM: PORTABLE CHEST 1 VIEW COMPARISON:  Chest radiograph 03/07/2023 FINDINGS: Right arm PICC terminates at the cavoatrial junction. No pleural effusion. No pneumothorax. Redemonstrated are extensive bilateral interstitial opacities with a more focal airspace opacity at the left lung base, stable to slightly improved from 03/07/2023. Unchanged cardiac and mediastinal contours. No radiographically apparent displaced rib fractures. Visualized upper abdomen is unremarkable. IMPRESSION: Redemonstrated diffuse bilateral interstitial opacities with slight interval improvement in superimposed bilateral patchy airspace opacities. Electronically Signed   By: Lorenza Cambridge M.D.   On: 03/09/2023 10:08   VAS [place] UPPER EXTREMITY VENOUS DUPLEX  Result Date: 03/07/2023 UPPER VENOUS STUDY  Patient Name:  BARTON WANT [facility]  Date of Exam:   03/07/2023 Medical Rec #: [id number]       Accession #:    [id number] Date of Birth: 06-May-1930      Patient Gender: M Patient Age:   89 years Exam Location:  [facility] Procedure:      VAS [place] UPPER EXTREMITY VENOUS DUPLEX Referring Phys: Marliyah Reid VAN DAM --------------------------------------------------------------------------------  Indications: pulmonary embolism Anticoagulation: Heparin. Limitations: IV placement, bandages. Comparison Study: No prior study.  Performing Technologist: McKayla Maag RVT, [place]  Examination Guidelines: A complete evaluation includes B-mode imaging, spectral Doppler, color Doppler, and power Doppler as needed of all accessible portions of each vessel. Bilateral testing is considered an integral part of a complete examination. Limited examinations for reoccurring indications may be performed as noted.  Right Findings: +----------+------------+---------+-----------+----------+-------+ RIGHT     CompressiblePhasicitySpontaneousPropertiesSummary +----------+------------+---------+-----------+----------+-------+ IJV           Full       Yes       Yes                      +----------+------------+---------+-----------+----------+-------+ Subclavian    Full       Yes       Yes                      +----------+------------+---------+-----------+----------+-------+ Axillary      Full       Yes       Yes                      +----------+------------+---------+-----------+----------+-------+ Brachial      Full       Yes       Yes                      +----------+------------+---------+-----------+----------+-------+ Radial        Full                                          +----------+------------+---------+-----------+----------+-------+ Ulnar         Full                                          +----------+------------+---------+-----------+----------+-------+ Cephalic      Full                                          +----------+------------+---------+-----------+----------+-------+ Basilic       Full                                          +----------+------------+---------+-----------+----------+-------+  Left Findings: +----------+------------+---------+-----------+----------+-------+ LEFT      CompressiblePhasicitySpontaneousPropertiesSummary +----------+------------+---------+-----------+----------+-------+ IJV           Full       Yes       Yes                       +----------+------------+---------+-----------+----------+-------+ Subclavian    Full       Yes       Yes                      +----------+------------+---------+-----------+----------+-------+ Axillary      Full       Yes       Yes                      +----------+------------+---------+-----------+----------+-------+  Brachial      Full       Yes       Yes                      +----------+------------+---------+-----------+----------+-------+ Radial        Full                                          +----------+------------+---------+-----------+----------+-------+ Ulnar         Full                                          +----------+------------+---------+-----------+----------+-------+ Cephalic      Full                                          +----------+------------+---------+-----------+----------+-------+ Basilic       Full                                          +----------+------------+---------+-----------+----------+-------+  Summary: No evidence of deep vein or superficial vein thrombosis involving the right and left upper extremities. Right: However, unable to visualize segments of the brachial veins due to picc line and bandage.  Left: However, unable to visualize segments of the radial and ulnar veins due to IV placement.  *See table(s) above for measurements and observations.  Diagnosing physician: Waverly Ferrari MD Electronically signed by Waverly Ferrari MD on 03/07/2023 at 6:17:24 PM.    Final    VAS [place] LOWER EXTREMITY VENOUS (DVT)  Result Date: 03/07/2023  Lower Venous DVT Study Patient Name:  UNNAMED [facility]EIEN [facility]  Date of Exam:   03/07/2023 Medical Rec #: [id number]       Accession #:    [id number] Date of Birth: 1930/07/16      Patient Gender: M Patient Age:   61 years Exam Location:  [facility] Procedure:      VAS [place] LOWER EXTREMITY VENOUS (DVT) Referring Phys: Clerance Umland VAN DAM  --------------------------------------------------------------------------------  Indications: Pulmonary embolism.  Anticoagulation: Heparin. Comparison Study: No prior study. Performing Technologist: McKayla Maag RVT, [place]  Examination Guidelines: A complete evaluation includes B-mode imaging, spectral Doppler, color Doppler, and power Doppler as needed of all accessible portions of each vessel. Bilateral testing is considered an integral part of a complete examination. Limited examinations for reoccurring indications may be performed as noted. The reflux portion of the exam is performed with the patient in reverse Trendelenburg.  +---------+---------------+---------+-----------+----------+--------------+ RIGHT    CompressibilityPhasicitySpontaneityPropertiesThrombus Aging +---------+---------------+---------+-----------+----------+--------------+ CFV      Full           Yes      Yes                                 +---------+---------------+---------+-----------+----------+--------------+ SFJ      Full                                                        +---------+---------------+---------+-----------+----------+--------------+  FV Prox  Full                                                        +---------+---------------+---------+-----------+----------+--------------+ FV Mid   Full                                                        +---------+---------------+---------+-----------+----------+--------------+ FV DistalFull                                                        +---------+---------------+---------+-----------+----------+--------------+ PFV      Full                                                        +---------+---------------+---------+-----------+----------+--------------+ POP      Full           Yes      Yes                                 +---------+---------------+---------+-----------+----------+--------------+ PTV      Full                                                         +---------+---------------+---------+-----------+----------+--------------+ PERO     Partial        Yes      Yes                  Acute          +---------+---------------+---------+-----------+----------+--------------+   +---------+---------------+---------+-----------+----------+--------------+ LEFT     CompressibilityPhasicitySpontaneityPropertiesThrombus Aging +---------+---------------+---------+-----------+----------+--------------+ CFV      Full           Yes      Yes                                 +---------+---------------+---------+-----------+----------+--------------+ SFJ      Full                                                        +---------+---------------+---------+-----------+----------+--------------+ FV Prox  Full                                                        +---------+---------------+---------+-----------+----------+--------------+  FV Mid   Full                                                        +---------+---------------+---------+-----------+----------+--------------+ FV DistalFull                                                        +---------+---------------+---------+-----------+----------+--------------+ PFV      Full                                                        +---------+---------------+---------+-----------+----------+--------------+ POP      Full           Yes      Yes                                 +---------+---------------+---------+-----------+----------+--------------+ PTV      Full                                                        +---------+---------------+---------+-----------+----------+--------------+ PERO     Full                                                        +---------+---------------+---------+-----------+----------+--------------+     Summary: RIGHT: - Findings consistent with acute deep vein  thrombosis involving the right peroneal veins. - No cystic structure found in the popliteal fossa.  LEFT: - There is no evidence of deep vein thrombosis in the lower extremity.  - No cystic structure found in the popliteal fossa.  *See table(s) above for measurements and observations. Electronically signed by Waverly Ferrari MD on 03/07/2023 at 6:16:33 PM.    Final       Assessment/Plan:  INTERVAL HISTORY: fio2 to 60% Principal Problem:   Acute pulmonary embolism Active Problems:   Hyperlipidemia   PMR (polymyalgia rheumatica)   AAA (abdominal aortic aneurysm) without rupture (HCC)   Generalized weakness   Acute hypoxic respiratory failure   Acute hyponatremia   Elevated troponin   Lactic acidosis   HCAP (healthcare-associated pneumonia)   Chronic diastolic CHF (congestive heart failure)   BPH (benign prostatic hyperplasia)   Pulmonary embolism   Eosinophilic pneumonia   Aortitis   Atrial flutter   FUO (fever of unknown origin)   S/P insertion of endovascular thoracic aortic stent graft   Drug-induced insomnia   Anxiety   DVT, lower extremity, distal, acute, right   PAF (paroxysmal atrial fibrillation)    BALTASAR TWILLEY is a 89 y.o. male with complicated medical history including aortic aneurysm status post placement of stent graft, polymyalgia rheumatica  that has been well-controlled on prednisone who has been experiencing malaise for nearly 8 months with intermittent fevers and elevated inflammatory markers, who was worked up for this condition by infectious disease at [facility] including PET scan that showed increased uptake in the aorta near stent graft with question of aortitis.  The tagged white cell scan subsequently did not show uptake in this area or anywhere else in the body.  I reviewed the case with Dr.Julia Dolores Frame yesterday over the phone and she stated that they had reviewed the PET scan again with radiology and that radiology is not entirely convinced of the  uptake being particularly dramatic.  Regardless the patient was the begun on empiric antibiotic regimen for possible bacterial infectious aortitis with daptomycin and ceftriaxone.  The plan was to see how the patient did on this including monitoring his inflammatory markers.  All along however there has been suspicion that there might be an autoimmune process involved and the patient is followed closely by Dr. Alberteen Spindle with nephrology at [facility].  He has now been admitted to [facility] with an acute pulmonary embolism and a CT angiogram that also shows multifocal infiltrates.  He is experienced progressive hypoxemic respiratory failure requiring high flow oxygen currently on high flow oxygen via nasal cannula.  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  His antibiotics currently are vancomycin and cefepime having been moved from daptomycin ceftriaxone disease.  #1  Pulmonary embolism with heart strain and DVT identified in the right lower extremity  On anticoagulation  #2 pneumonia: favor eosinophilic PNA vs perhaps more likely declaration of autoimmune pulmonary pathology  Broadly covered for bacterial pathogens with vancomycin and cefepime  He is also on Solu-Medrol 125 mg every 6 hours.  #3 ? Aortitis: I would plan on him continuing with antibiotics to complete 6 weeks that I would not go back to daptomycin in case it was causing eosinophilic pneumonia and I would prefer doxycycline orally to systemic vancomycin at discharge.  We will hold off on, with a final antibiotic plan at this time.  I have my suspicion is that the aortitis is a " "red herring" he has a different underlying autoimmune pathology that was responsible for his elevated inflammatory markers  #4  Suspected underlying autoimmune disease potentially with  pulmonary complications and patient with known polymyalgia rheumatica:  He is on corticosteroids and will be interesting to see if he improves globally if he assists out of the ICU and onto the floor and ultimately back home.    #5 nsomnia: improved  #6  Atrial  fibrillation: Currently in sinus rhythm after amiodarone   I have personally spent 52 minutes involved in face-to-face and non-face-to-face activities for this patient on the day of the visit. Professional time spent includes the following activities: Preparing to see the patient (review of tests), Obtaining and/or reviewing separately obtained history (admission/discharge record), Performing a medically appropriate examination and/or evaluation , Ordering medications/tests/procedures, referring and communicating with other health care professionals, Documenting clinical information in the EMR, Independently interpreting results (not separately reported), Communicating results to the patient/family/caregiver, Counseling and educating the patient/family/caregiver and Care coordination (not separately reported).  ).        LOS: 3 days   Acey Lav 03/09/2023, 11:36 AM

## 2023-03-10 ENCOUNTER — Other Ambulatory Visit: Payer: Self-pay

## 2023-03-10 ENCOUNTER — Inpatient Hospital Stay (HOSPITAL_COMMUNITY): Payer: Medicare Other

## 2023-03-10 DIAGNOSIS — J8281 Chronic eosinophilic pneumonia: Secondary | ICD-10-CM | POA: Diagnosis not present

## 2023-03-10 DIAGNOSIS — I2609 Other pulmonary embolism with acute cor pulmonale: Secondary | ICD-10-CM | POA: Diagnosis not present

## 2023-03-10 DIAGNOSIS — I2699 Other pulmonary embolism without acute cor pulmonale: Secondary | ICD-10-CM | POA: Diagnosis not present

## 2023-03-10 DIAGNOSIS — R410 Disorientation, unspecified: Secondary | ICD-10-CM | POA: Diagnosis not present

## 2023-03-10 DIAGNOSIS — I824Z1 Acute embolism and thrombosis of unspecified deep veins of right distal lower extremity: Secondary | ICD-10-CM | POA: Diagnosis not present

## 2023-03-10 LAB — BASIC METABOLIC PANEL
Anion gap: 11 (ref 5–15)
BUN: 30 mg/dL — ABNORMAL HIGH (ref 8–23)
CO2: 23 mmol/L (ref 22–32)
Calcium: 7.6 mg/dL — ABNORMAL LOW (ref 8.9–10.3)
Chloride: 102 mmol/L (ref 98–111)
Creatinine, Ser: 1.01 mg/dL (ref 0.61–1.24)
GFR, Estimated: >60 mL/min (ref >60)
Glucose, Bld: 151 mg/dL — ABNORMAL HIGH (ref 70–99)
Potassium: 3.7 mmol/L (ref 3.5–5.1)
Sodium: 136 mmol/L (ref 135–145)

## 2023-03-10 LAB — POCT I-STAT 7, (LYTES, BLD GAS, ICA,H+H)
Acid-base deficit: 1 mmol/L (ref 0.0–2.0)
Bicarbonate: 21.2 mmol/L (ref 20.0–28.0)
Calcium, Ion: 1.07 mmol/L — ABNORMAL LOW (ref 1.15–1.40)
HCT: 45 % (ref 39.0–52.0)
Hemoglobin: 15.3 g/dL (ref 13.0–17.0)
O2 Saturation: 93 %
Potassium: 4 mmol/L (ref 3.5–5.1)
Sodium: 134 mmol/L — ABNORMAL LOW (ref 135–145)
TCO2: 22 mmol/L (ref 22–32)
pCO2 arterial: 29.5 mmHg — ABNORMAL LOW (ref 32–48)
pH, Arterial: 7.466 — ABNORMAL HIGH (ref 7.35–7.45)
pO2, Arterial: 60 mmHg — ABNORMAL LOW (ref 83–108)

## 2023-03-10 LAB — CBC WITH DIFFERENTIAL/PLATELET
Abs Immature Granulocytes: 0 10*3/uL (ref 0.00–0.07)
Basophils Absolute: 0 10*3/uL (ref 0.0–0.1)
Basophils Relative: 0 %
Eosinophils Absolute: 0 10*3/uL (ref 0.0–0.5)
Eosinophils Relative: 0 %
HCT: 32 % — ABNORMAL LOW (ref 39.0–52.0)
Hemoglobin: 10.7 g/dL — ABNORMAL LOW (ref 13.0–17.0)
Lymphocytes Relative: 2 %
Lymphs Abs: 0.6 10*3/uL — ABNORMAL LOW (ref 0.7–4.0)
MCH: 31.4 pg (ref 26.0–34.0)
MCHC: 33.4 g/dL (ref 30.0–36.0)
MCV: 93.8 fL (ref 80.0–100.0)
Monocytes Absolute: 0.9 10*3/uL (ref 0.1–1.0)
Monocytes Relative: 3 %
Neutro Abs: 29.9 10*3/uL — ABNORMAL HIGH (ref 1.7–7.7)
Neutrophils Relative %: 95 %
Platelets: 418 10*3/uL — ABNORMAL HIGH (ref 150–400)
RBC: 3.41 MIL/uL — ABNORMAL LOW (ref 4.22–5.81)
RDW: 16.5 % — ABNORMAL HIGH (ref 11.5–15.5)
WBC: 31.5 10*3/uL — ABNORMAL HIGH (ref 4.0–10.5)
nRBC: 0 % (ref 0.0–0.2)
nRBC: 0 /100 WBC

## 2023-03-10 LAB — PROCALCITONIN: Procalcitonin: 0.52 ng/mL

## 2023-03-10 LAB — HYPERSENSITIVITY PNEUMONITIS
A. Pullulans Abs: NEGATIVE
A.Fumigatus #1 Abs: NEGATIVE
Micropolyspora faeni, IgG: NEGATIVE
Pigeon Serum Abs: NEGATIVE
Thermoact. Saccharii: NEGATIVE
Thermoactinomyces vulgaris, IgG: NEGATIVE

## 2023-03-10 LAB — GLUCOSE, CAPILLARY
Glucose-Capillary: 141 mg/dL — ABNORMAL HIGH (ref 70–99)
Glucose-Capillary: 160 mg/dL — ABNORMAL HIGH (ref 70–99)
Glucose-Capillary: 139 mg/dL — ABNORMAL HIGH (ref 70–99)
Glucose-Capillary: 173 mg/dL — ABNORMAL HIGH (ref 70–99)
Glucose-Capillary: 175 mg/dL — ABNORMAL HIGH (ref 70–99)

## 2023-03-10 LAB — FUNGITELL BETA-D-GLUCAN
Fungitell Value:: <31.25 pg/mL
Result Name:: NEGATIVE

## 2023-03-10 LAB — VANCOMYCIN, PEAK: Vancomycin Pk: 5 ug/mL — ABNORMAL LOW (ref 30–40)

## 2023-03-10 LAB — PHOSPHORUS: Phosphorus: 1.5 mg/dL — ABNORMAL LOW (ref 2.5–4.6)

## 2023-03-10 LAB — CULTURE, BLOOD (ROUTINE X 2): Special Requests: ADEQUATE

## 2023-03-10 LAB — MAGNESIUM: Magnesium: 2.4 mg/dL (ref 1.7–2.4)

## 2023-03-10 LAB — SEDIMENTATION RATE: Sed Rate: 109 mm/hr — ABNORMAL HIGH (ref 0–16)

## 2023-03-10 MED ORDER — METHYLPREDNISOLONE SODIUM SUCC 40 MG IJ SOLR
40.0000 mg | Freq: Two times a day (BID) | INTRAMUSCULAR | Status: AC
  Administered 2023-03-10 – 2023-03-18 (×17): 40 mg via INTRAVENOUS
  Filled 2023-03-10 (×17): qty 1

## 2023-03-10 MED ORDER — APIXABAN 5 MG PO TABS
10.0000 mg | ORAL_TABLET | Freq: Two times a day (BID) | ORAL | Status: AC
  Administered 2023-03-10 – 2023-03-13 (×7): 10 mg via ORAL
  Filled 2023-03-10 (×7): qty 2

## 2023-03-10 MED ORDER — METOPROLOL TARTRATE 5 MG/5ML IV SOLN: 5.0000 mg | Freq: Once | INTRAVENOUS | Status: AC

## 2023-03-10 MED ORDER — METHYLPREDNISOLONE SODIUM SUCC 125 MG IJ SOLR: 125.0000 mg | Freq: Two times a day (BID) | INTRAMUSCULAR | Status: DC

## 2023-03-10 MED ORDER — MENTHOL 3 MG MT LOZG
1.0000 | LOZENGE | OROMUCOSAL | Status: DC | PRN
  Filled 2023-03-10: qty 9

## 2023-03-10 MED ORDER — ALBUMIN HUMAN 25 % IV SOLN
25.0000 g | Freq: Four times a day (QID) | INTRAVENOUS | Status: AC
  Administered 2023-03-10 – 2023-03-11 (×4): 25 g via INTRAVENOUS
  Filled 2023-03-10 (×4): qty 100

## 2023-03-10 MED ORDER — QUETIAPINE FUMARATE 25 MG PO TABS
25.0000 mg | ORAL_TABLET | Freq: Every day | ORAL | Status: DC
  Administered 2023-03-10 – 2023-03-12 (×3): 25 mg via ORAL
  Filled 2023-03-10 (×3): qty 1

## 2023-03-10 MED ORDER — METOPROLOL TARTRATE 5 MG/5ML IV SOLN
INTRAVENOUS | Status: AC
  Administered 2023-03-10: 5 mg via INTRAVENOUS
  Filled 2023-03-10: qty 5

## 2023-03-10 MED ORDER — METOPROLOL TARTRATE 5 MG/5ML IV SOLN: 2.5000 mg | Freq: Once | INTRAVENOUS | Status: DC

## 2023-03-10 MED ORDER — APIXABAN 5 MG PO TABS
5.0000 mg | ORAL_TABLET | Freq: Two times a day (BID) | ORAL | Status: DC
  Administered 2023-03-14 – 2023-03-23 (×19): 5 mg via ORAL
  Filled 2023-03-10 (×19): qty 1

## 2023-03-10 MED ORDER — METOPROLOL TARTRATE 12.5 MG HALF TABLET
12.5000 mg | ORAL_TABLET | Freq: Two times a day (BID) | ORAL | Status: DC
  Administered 2023-03-11 – 2023-03-22 (×23): 12.5 mg via ORAL
  Filled 2023-03-10 (×27): qty 1

## 2023-03-10 MED ORDER — APIXABAN 5 MG PO TABS: 5.0000 mg | ORAL_TABLET | Freq: Two times a day (BID) | ORAL | Status: DC

## 2023-03-10 MED ORDER — METHYLPREDNISOLONE SODIUM SUCC 125 MG IJ SOLR: 125.0000 mg | Freq: Four times a day (QID) | INTRAMUSCULAR | Status: DC

## 2023-03-10 MED ORDER — METHYLPREDNISOLONE SODIUM SUCC 125 MG IJ SOLR
INTRAMUSCULAR | Status: AC
  Administered 2023-03-10: 125 mg via INTRAVENOUS
  Filled 2023-03-10: qty 2

## 2023-03-10 MED ORDER — FUROSEMIDE 10 MG/ML IJ SOLN
40.0000 mg | Freq: Four times a day (QID) | INTRAMUSCULAR | Status: AC
  Administered 2023-03-10 (×2): 40 mg via INTRAVENOUS
  Filled 2023-03-10 (×2): qty 4

## 2023-03-10 MED ORDER — DEXMEDETOMIDINE HCL IN NACL 400 MCG/100ML IV SOLN
0.0000 ug/kg/h | INTRAVENOUS | Status: DC
  Administered 2023-03-10: 0.4 ug/kg/h via INTRAVENOUS
  Administered 2023-03-11 (×2): 0.6 ug/kg/h via INTRAVENOUS
  Administered 2023-03-11: 0.5 ug/kg/h via INTRAVENOUS
  Administered 2023-03-12: 1 ug/kg/h via INTRAVENOUS
  Administered 2023-03-12: 0.8 ug/kg/h via INTRAVENOUS
  Administered 2023-03-13: 1 ug/kg/h via INTRAVENOUS
  Administered 2023-03-13: 1.2 ug/kg/h via INTRAVENOUS
  Administered 2023-03-13 (×2): 1 ug/kg/h via INTRAVENOUS
  Administered 2023-03-14 (×2): 1.2 ug/kg/h via INTRAVENOUS
  Filled 2023-03-10 (×4): qty 100
  Filled 2023-03-10: qty 400
  Filled 2023-03-10 (×5): qty 100
  Filled 2023-03-10: qty 200

## 2023-03-10 MED ORDER — APIXABAN 5 MG PO TABS: 10.0000 mg | ORAL_TABLET | Freq: Two times a day (BID) | ORAL | Status: DC

## 2023-03-10 MED ORDER — VANCOMYCIN HCL 1250 MG/250ML IV SOLN
1250.0000 mg | INTRAVENOUS | Status: DC
  Administered 2023-03-10 – 2023-03-13 (×4): 1250 mg via INTRAVENOUS
  Filled 2023-03-10 (×4): qty 250

## 2023-03-10 MED ORDER — PIPERACILLIN-TAZOBACTAM 3.375 G IVPB
3.3750 g | Freq: Three times a day (TID) | INTRAVENOUS | Status: DC
  Administered 2023-03-10 – 2023-03-15 (×14): 3.375 g via INTRAVENOUS
  Filled 2023-03-10 (×15): qty 50

## 2023-03-10 MED ORDER — POTASSIUM CHLORIDE CRYS ER 20 MEQ PO TBCR
40.0000 meq | EXTENDED_RELEASE_TABLET | Freq: Once | ORAL | Status: AC
  Administered 2023-03-10: 40 meq via ORAL
  Filled 2023-03-10: qty 2

## 2023-03-10 NOTE — Progress Notes (Signed)
PT Cancellation Note  Patient Details Name: NAZIAH URDA MRN: [id number] DOB: 07/29/1930   Cancelled Treatment:    Reason Eval/Treat Not Completed: Patient not medically ready (pt started on precedex and per conversation with RN not appropriate at this time.)   Prim Morace B Justin Buechner 03/10/2023, 12:16 PM Merryl Hacker, PT Acute Rehabilitation Services Office: [phone]

## 2023-03-10 NOTE — Progress Notes (Signed)
ANTICOAGULATION CONSULT NOTE -   Pharmacy Consult for lovenox>Eliquis Indication: pulmonary embolus and DVT  Allergies  Allergen Reactions   Amoxicillin Other (See Comments)    Per wife: nausea (taken on empty stomach)   Codeine Nausea And Vomiting   Doxycycline Other (See Comments)    Upset stomach, currently taking and tolerating well     Morphine And Related Nausea And Vomiting   Patient Measurements: Height: 6' (182.9 cm) Weight: 79.8 kg (175 lb 14.8 oz) IBW/kg (Calculated) : 77.6 Heparin Dosing Weight: TBW  Vital Signs: Temp: 98.7 F (37.1 C) (04/12 1100) Temp Source: Oral (04/12 1100) BP: 157/90 (04/12 1100) Pulse Rate: 108 (04/12 1100)  Labs: Recent Labs    03/08/23 0220 03/09/23 0143 03/10/23 0324  HGB 10.7* 10.2* 10.7*  HCT 31.2* 30.3* 32.0*  PLT 344 364 418*  CREATININE 0.89 0.83 1.01    Estimated Creatinine Clearance: 53.4 mL/min (by C-G formula based on SCr of 1.01 mg/dL).  Medical History: Past Medical History:  Diagnosis Date   AAA (abdominal aortic aneurysm)    Abnormal EKG    Left atrial abnormality   Allergic rhinitis    Allergy    Benign neoplasm of colon 04/14/10   3 small polyps on colonoscopy by Dr. Marina Goodell   Carpal tunnel syndrome    Cataract    Degenerative disc disease    Disturbances metabolism of methionine, homocystine, and cystathionine    Elevated homocysteine   Elevated prostate specific antigen (PSA)    Hearing loss    Hyperlipidemia    Impotence of organic origin    Penile implant   Internal hemorrhoids    Neck pain    Otosclerosis of both ears    Peripheral vascular disease    Bilateral femoral bruit   Plantar fasciitis    Polymyalgia rheumatica    Rotator cuff syndrome of left shoulder    Scoliosis    Shoulder pain    Assessment: 89 YOM presenting with weakness, CT angio chest shows PE with RHS, venous duplex of right lower extremity showed DVT. Patient is not on anticoagulation PTA. Pharmacy has been consulted  to transition lovenox to apixaban.  Goal of Therapy:  Heparin level 0.3-0.7 units/ml Monitor platelets by anticoagulation protocol: Yes   Plan:  Stop lovenox 80 mg q12 hours Start apixaban 10 mg BID for 7 doses (to complete 7 days total of lovenox with apixaban load), then transition to 5 mg BID Monitor CBC and signs/symptoms of bleeding  Rockwell Alexandria, PharmD, [facility] PGY1 Pharmacy Resident 03/10/2023 11:48 AM

## 2023-03-10 NOTE — Progress Notes (Signed)
OT Cancellation Note  Patient Details Name: [name] MRN: [id number] DOB: [birth year]   Cancelled Treatment:    Reason Eval/Treat Not Completed: Fatigue/lethargy limiting ability to participate.   Evern Bio 03/10/2023, 10:32 AM Berna Spare, OTR/L Acute Rehabilitation Services Office: [phone]

## 2023-03-10 NOTE — Progress Notes (Signed)
OT Cancellation Note  Patient Details Name: [name] MRN: [id number] DOB: Sep 24, 1930   Cancelled Treatment:    Reason Eval/Treat Not Completed: Medical issues which prohibited therapy (Pt started on precedex, not appropriate for therapy today.)  Evern Bio 03/10/2023, 12:54 PM Berna Spare, OTR/L Acute Rehabilitation Services Office: [phone]

## 2023-03-10 NOTE — Progress Notes (Signed)
PT Cancellation Note  Patient Details Name: [name] MRN: [id number] DOB: Apr 27, 1930   Cancelled Treatment:    Reason Eval/Treat Not Completed: Patient declined, no reason specified (reports fatigue and nausea, refused mobility at this time)   Silvina Hackleman B [name] 03/10/2023, 9:01 AM Merryl Hacker, PT Acute Rehabilitation Services Office: [phone]

## 2023-03-10 NOTE — Progress Notes (Addendum)
Pharmacy Antibiotic Note  [name] is a 89 y.o. male admitted on 03/05/2023 with infectious vs inflammatory aortitis and pneumonia, ID following. Pharmacy has been consulted for vancomycin and Zosyn dosing.  Vancomycin trough ordered for today was missed and dose given at 1650. Vancomycin peak (drawn at 1949) = 5 mcg/ml. ?accuracy of this peak as this would be extremely low.  Plan: Continue Vancomycin 1250mg  q24 hours Will get random vancomycin level in a.m. and redose vanc if needed. Otherwise can use random level and trough to determine AUC Zosyn 3.375g IV q8 hours Bactrim for PJP prophylaxis  Monitor cultures, clinical status, renal function  ADDENDUM (2230) Night shift RN came and said that the vancomycin dose that was charted at 1650 was spiked but never given - RN must have scanned it but then didn't run it as trough was ordered. Trough was never drawn and vanc not given. So what is reported as vancomycin peak is ~27h post last dose of vanc (so more reflective of a late trough)  Utilizing [employer] single level vanc calculator, est ke 0.063 and est half-life ~11 hrs.   Plan: Change Vancomycin to 1250mg  IV q18h. Levels at Css on new steady state.  Height: 6' (182.9 cm) Weight: 79.8 kg (175 lb 14.8 oz) IBW/kg (Calculated) : 77.6  Temp (24hrs), Avg:98.2 F (36.8 C), Min:97.6 F (36.4 C), Max:98.7 F (37.1 C)  Recent Labs  Lab 03/05/23 1614 03/05/23 1622 03/06/23 0633 03/06/23 0820 03/06/23 1057 03/07/23 0240 03/08/23 0220 03/09/23 0143 03/10/23 0324 03/10/23 1949  WBC 20.0*  --  22.0*  --   --  26.1* 27.5* 27.8* 31.5*  --   CREATININE 1.17   < > 1.13  --   --  1.04 0.89 0.83 1.01  --   LATICACIDVEN 2.6*  --   --  2.4* 1.6  --   --   --   --   --   VANCOPEAK  --   --   --   --   --   --   --   --   --  5*   < > = values in this interval not displayed.     Estimated Creatinine Clearance: 53.4 mL/min (by C-G formula based on SCr of 1.01 mg/dL).    Allergies   Allergen Reactions   Amoxicillin Other (See Comments)    Per wife: nausea (taken on empty stomach)   Codeine Nausea And Vomiting   Doxycycline Other (See Comments)    Upset stomach, currently taking and tolerating well     Morphine And Related Nausea And Vomiting   Antimicrobials this admission: cefepime 4/7 >> 4/12 daptomycin 4/7 x1 Bactrim prophylaxis 4/8 >>  Vancomycin 4/8 >>  Zosyn 4/12 >>   Microbiology results: 4/7 BCx: NGTD 4/7 RVP: neg 4/8 MRSA PCR: neg  Thank you for allowing pharmacy to be a part of this patient's care.  Christoper Fabian, PharmD, BCPS Please see amion for complete clinical pharmacist phone list 03/10/2023 9:45 PM

## 2023-03-10 NOTE — Progress Notes (Signed)
NAME:  [name], MRN:  [id number], DOB:  02-26-1930, LOS: 4 ADMISSION DATE:  03/05/2023, CONSULTATION DATE: 4/8  REFERRING MD:  Daiva Eves CHIEF COMPLAINT:  Generalized Weakness   History of Present Illness:  89 yr old male with significant pmhx of polymyalgia rheumatica on chronic steroids, recent hospitalization for aortitis, CHF, chronic leukocytosis, AAA with S/P stent graft in 2018, HLD, and DDD was admitted with an acute PE at [facility] ED and initially complaining of generalized weakness on 4/7. Patient developed SVT on the morning of 4/8 along with respiratory distress and hypoxia requiring non-re breather mask. Patient being managed by [facility], Cardiology, and Infectious Disease. PCCM consulted by ID to assist in managing the acute hypoxic respiratory failure.   Pertinent  Medical History   Past Medical History:  Diagnosis Date   AAA (abdominal aortic aneurysm)    Abnormal EKG    Left atrial abnormality   Allergic rhinitis    Allergy    Benign neoplasm of colon 04/14/10   3 small polyps on colonoscopy by Dr. Marina Goodell   Carpal tunnel syndrome    Cataract    Degenerative disc disease    Disturbances metabolism of methionine, homocystine, and cystathionine    Elevated homocysteine   Elevated prostate specific antigen (PSA)    Hearing loss    Hyperlipidemia    Impotence of organic origin    Penile implant   Internal hemorrhoids    Neck pain    Otosclerosis of both ears    Peripheral vascular disease    Bilateral femoral bruit   Plantar fasciitis    Polymyalgia rheumatica    Rotator cuff syndrome of left shoulder    Scoliosis    Shoulder pain      Significant Hospital Events: Including procedures, antibiotic start and stop dates in addition to other pertinent events   4/7 Admitted for Acute PE 4/8 Developed Rapid Atrial Flutter-HR 170s-180s , along with respiratory failure requiring NRB, PCCM consulted  4/12 feeling better, HHFNC 60%, 45L, steroids tapered  Interim History /  Subjective:  Worse today.  Not sleeping. HHFNC up to 85%, 35L, with intermittent desaturations.  HR in 140's, ST on EKG, desaturating into low 80's, worsening anxiety, confused   Objective   Blood pressure 105/63, pulse (!) 141, temperature 98 F (36.7 C), temperature source Oral, resp. rate (!) 28, height 6' (1.829 m), weight 79.8 kg, SpO2 92 %.    FiO2 (%):  [50 %-85 %] 85 %   Intake/Output Summary (Last 24 hours) at 03/10/2023 1016 Last data filed at 03/10/2023 0800 Gross per 24 hour  Intake 789.16 ml  Output 2275 ml  Net -1485.84 ml   Filed Weights   03/06/23 0624 03/07/23 0440 03/08/23 0600  Weight: 77.1 kg 80 kg 79.8 kg   Examination: General:  ill appearing older male sitting upright in bed in distress HEENT: MM pink/moist,  pupils 3/reactive, facial flushing Neuro: Awake, anxious/ restless, confused, oriented to name, MAE, follows commands CV: rr, ST> now SR/ borderline ST, no murmur PULM:  tachypneic, moderately labored, dry cough, clear anteriorly, posterior bibasilar rales  GI: soft, bs+, NT, voids Extremities: warm/dry, no LE edema  Skin: no rashes  UOP 2.1L Net -2L Afebrile Labs reviewed> K 3.7, BUN/ sCr 22/ 0.83> 30/ 1.01, WBC 27.8> 31.5  Resolved Hospital Problem list   N/a   Assessment & Plan:  Acute Hypoxic Respiratory Failure in setting possibly of multifocal pna vs eosinophilic pna/ ILD/ autoimmune process  Acute PE  with RLE DVT PMR on chronic prednisone, immunocompromised  - high dose steroids started 4/8 PM - was responding to high dose steroids, abx, and diureses with improving O2 needs 4/11.  Steroids taper started 4/11.    - high risk for intubation.  Change diet to NPO except meds/ sips for now - goal sat 90-94% - check ABG and CXR - start low dose precedex, if worsening resp distress/ pending ABG, consider BiPAP.  Continue HHFNC for now.  NRB prn, expect some short lived desaturations with exertion but should recover.  - abx per ID recs> cont  vanc.  Discussed with Dr. Katrinka Blazing, change to zosyn to cover any possible aspiration and void neuro induced cefepime encephalopathy.  Remains on bactrim for PCP ppx - continue solumedrol 40mg  q 12 after discussion with Dr. Katrinka Blazing - check RVP - additional lasix today.  Monitor UOP/ renal function closely  - fungitell, HSP, pneumocystis pcr pending.  RA noted at 26 otherwise autoimmune panel neg - change lovenox to DOAC> not a candidate for bronchoscopy given O2 needs/ tenuous respiratory status   - recheck PCT and sed rate  Paroxysmal atrial flutter/atrial fibrillation - amio stopped 4/10 - given his tenuous respiratory status, high risk for recurrence  - adding scheduled lopressor to help with rate control - lovenox to DOAC as above  - optimize electrolytes K and Mag - EF normal EF with no wall motion abnormalities and diastolic parameters are normal  Hyperlipidemia -  Crestor  BPH -  tamsulosin  AAA s/p aortic graft - Possible aortitis - ABX per ID.  Dapto stopped 4/7 for concern of eosinophilic PNA.  Followed at [facility] ID  Hyponatremia, resolved - trend BMET  Prediabetes, steroid induced hyperglycemia  Patient hemoglobin A1c 6.3 - SSI prn  - CBG q 4, goal 140-180  Anxiety disorder/insomnia with concern for developing delirium vs metabolic/ toxic encephalopathy  - precedex as above - possibly exacerbated by high dose steroids - trazodone q hs prn, hydroxyzine and melatonin prn  - delirium precautions - continue supportive care   Best Practice (right click and "Reselect all SmartList Selections" daily)   Diet/type: NPO with sips/ meds for now DVT prophylaxis: systemic dose LMWH> DOAC GI prophylaxis: PPI Lines: N/A Foley:  N/A Code Status:  full code Last date of multidisciplinary goals of care discussion [4/11: Patient, his son  updated at beside, decision was to continue full scope of care].  He would like to go for ventilator if needed as last resort for  short-term only.  Wife updated at bedside 4/12.   This patient is critically ill with multiple organ system failure which requires frequent high complexity decision making, assessment, support, evaluation, and titration of therapies. This was completed through the application of advanced monitoring technologies and extensive interpretation of multiple databases.  During this encounter critical care time was devoted to patient care services described in this note for 38 minutes.     Posey Boyer, MSN, AG-ACNP-BC [facility] 03/10/2023, 10:16 AM  See Amion for pager If no response to pager, please call PCCM consult pager After 7:00 pm call Elink

## 2023-03-10 NOTE — Progress Notes (Signed)
Subjective:  Patient much worse this am  Antibiotics:  Anti-infectives (From admission, onward)    Start     Dose/Rate Route Frequency Ordered Stop   03/10/23 2000  piperacillin-tazobactam (ZOSYN) IVPB 3.375 g        3.375 g 12.5 mL/hr over 240 Minutes Intravenous Every 8 hours 03/10/23 1140     03/06/23 1715  vancomycin (VANCOREADY) IVPB 1500 mg/300 mL  Status:  Discontinued        1,500 mg 150 mL/hr over 120 Minutes Intravenous  Once 03/06/23 1628 03/06/23 1628   03/06/23 1628  vancomycin (VANCOREADY) IVPB 1250 mg/250 mL        1,250 mg 166.7 mL/hr over 90 Minutes Intravenous Every 24 hours 03/06/23 1628     03/06/23 0900  sulfamethoxazole-trimethoprim (BACTRIM DS) 800-160 MG per tablet 1 tablet        1 tablet Oral Once per day on Mon Wed Fri 03/05/23 2141     03/06/23 0800  ceFEPIme (MAXIPIME) 2 g in sodium chloride 0.9 % 100 mL IVPB  Status:  Discontinued        2 g 200 mL/hr over 30 Minutes Intravenous Every 12 hours 03/05/23 2140 03/10/23 1140   03/05/23 1845  DAPTOmycin (CUBICIN) 600 mg in sodium chloride 0.9 % IVPB  Status:  Discontinued        600 mg 124 mL/hr over 30 Minutes Intravenous Daily 03/05/23 1833 03/06/23 1546   03/05/23 1645  ceFEPIme (MAXIPIME) 2 g in sodium chloride 0.9 % 100 mL IVPB        2 g 200 mL/hr over 30 Minutes Intravenous  Once 03/05/23 1639 03/05/23 2018   03/05/23 1645  vancomycin (VANCOREADY) IVPB 1500 mg/300 mL  Status:  Discontinued        1,500 mg 150 mL/hr over 120 Minutes Intravenous  Once 03/05/23 1639 03/05/23 1645       Medications: Scheduled Meds:  apixaban  10 mg Oral BID   Followed by   Melene Muller ON 03/14/2023] apixaban  5 mg Oral BID   Chlorhexidine Gluconate Cloth  6 each Topical Daily   fluorometholone  1 drop Right Eye QHS   folic acid  1 mg Oral Daily   furosemide  40 mg Intravenous Q6H   insulin aspart  0-15 Units Subcutaneous TID WC   insulin aspart  0-5 Units Subcutaneous QHS   melatonin  3 mg Oral QHS    methylPREDNISolone (SOLU-MEDROL) injection  40 mg Intravenous Q12H   metoprolol tartrate  12.5 mg Oral BID   pantoprazole  40 mg Oral Daily   QUEtiapine  25 mg Oral QHS   rosuvastatin  5 mg Oral QHS   senna-docusate  1 tablet Oral BID   sodium chloride flush  10-40 mL Intracatheter Q12H   sulfamethoxazole-trimethoprim  1 tablet Oral Once per day on Mon Wed Fri   traZODone  100 mg Oral QHS   Continuous Infusions:  sodium chloride Stopped (03/10/23 1127)   albumin human 27 mL/hr at 03/10/23 1300   dexmedetomidine (PRECEDEX) IV infusion 0.5 mcg/kg/hr (03/10/23 1300)   piperacillin-tazobactam (ZOSYN)  IV     vancomycin Stopped (03/09/23 1723)   PRN Meds:.sodium chloride, acetaminophen **OR** acetaminophen, hydrOXYzine, lidocaine, menthol-cetylpyridinium, ondansetron (ZOFRAN) IV, mouth rinse, sodium chloride flush    Objective: Weight change:   Intake/Output Summary (Last 24 hours) at 03/10/2023 1511 Last data filed at 03/10/2023 1300 Gross per 24 hour  Intake 819 ml  Output 2025 ml  Net -1206 ml    Blood pressure 134/75, pulse 99, temperature 98.7 F (37.1 C), temperature source Oral, resp. rate (!) 29, height 6' (1.829 m), weight 79.8 kg, SpO2 96 %. Temp:  [97.7 F (36.5 C)-98.7 F (37.1 C)] 98.7 F (37.1 C) (04/12 1100) Pulse Rate:  [79-141] 99 (04/12 1200) Resp:  [19-34] 29 (04/12 1200) BP: (105-157)/(63-100) 134/75 (04/12 1200) SpO2:  [86 %-98 %] 96 % (04/12 1200) FiO2 (%):  [50 %-85 %] 85 % (04/12 0925)  Physical Exam: Physical Exam Constitutional:      Appearance: He is well-developed.  HENT:     Head: Normocephalic and atraumatic.  Eyes:     Extraocular Movements: Extraocular movements intact.     Conjunctiva/sclera: Conjunctivae normal.     Pupils: Pupils are equal, round, and reactive to light.  Cardiovascular:     Rate and Rhythm: Regular rhythm. Tachycardia present.  Pulmonary:     Effort: Pulmonary effort is normal. No respiratory distress.  Abdominal:      General: There is no distension.     Palpations: Abdomen is soft.  Musculoskeletal:        General: Normal range of motion.     Cervical back: Normal range of motion and neck supple.  Skin:    General: Skin is warm and dry.     Findings: No erythema or rash.  Neurological:     General: No focal deficit present.     Mental Status: He is alert.     Comments: He is more confused than at any time that I have seen him so far  Psychiatric:        Mood and Affect: Mood normal.        Behavior: Behavior normal.        Thought Content: Thought content normal.        Judgment: Judgment normal.      CBC:    BMET Recent Labs    03/09/23 0143 03/10/23 0324 03/10/23 1225  NA 132* 136 134*  K 3.9 3.7 4.0  CL 103 102  --   CO2 19* 23  --   GLUCOSE 175* 151*  --   BUN 22 30*  --   CREATININE 0.83 1.01  --   CALCIUM 7.5* 7.6*  --       Liver Panel  Recent Labs    03/08/23 0220 03/09/23 0143  PROT 5.8* 5.6*  ALBUMIN 1.8* 1.8*  AST 64* 60*  ALT 50* 63*  ALKPHOS 77 81  BILITOT 0.5 0.4        Sedimentation Rate Recent Labs    03/10/23 1133  ESRSEDRATE 109*    C-Reactive Protein No results for input(s): "CRP" in the last 72 hours.   Micro Results: Recent Results (from the past 720 hour(s))  Culture, blood (routine x 2)     Status: None   Collection Time: 03/05/23  4:14 PM   Specimen: BLOOD  Result Value Ref Range Status   Specimen Description BLOOD SITE NOT SPECIFIED  Final   Special Requests   Final    BOTTLES DRAWN AEROBIC AND ANAEROBIC Blood Culture adequate volume   Culture   Final    NO GROWTH 5 DAYS Performed at [facility], [address], [place], [place] [address]    Report Status 03/10/2023 FINAL  Final  Resp panel by RT-PCR (RSV, Flu A&B, Covid) Anterior Nasal Swab     Status: None   Collection Time: 03/05/23  4:29 PM  Specimen: Anterior Nasal Swab  Result Value Ref Range Status   SARS Coronavirus 2 by RT PCR NEGATIVE NEGATIVE  Final   Influenza A by PCR NEGATIVE NEGATIVE Final   Influenza B by PCR NEGATIVE NEGATIVE Final    Comment: (NOTE) The Xpert Xpress SARS-CoV-2/FLU/RSV plus assay is intended as an aid in the diagnosis of influenza from Nasopharyngeal swab specimens and should not be used as a sole basis for treatment. Nasal washings and aspirates are unacceptable for Xpert Xpress SARS-CoV-2/FLU/RSV testing.  Fact Sheet for Patients: BloggerCourse.com  Fact Sheet for Healthcare Providers: SeriousBroker.it  This test is not yet approved or cleared by the [place] FDA and has been authorized for detection and/or diagnosis of SARS-CoV-2 by FDA under an Emergency Use Authorization (EUA). This EUA will remain in effect (meaning this test can be used) for the duration of the COVID-19 declaration under Section 564(b)(1) of the Act, 21 U.S.C. section 360bbb-3(b)(1), unless the authorization is terminated or revoked.     Resp Syncytial Virus by PCR NEGATIVE NEGATIVE Final    Comment: (NOTE) Fact Sheet for Patients: BloggerCourse.com  Fact Sheet for Healthcare Providers: SeriousBroker.it  This test is not yet approved or cleared by the [place] FDA and has been authorized for detection and/or diagnosis of SARS-CoV-2 by FDA under an Emergency Use Authorization (EUA). This EUA will remain in effect (meaning this test can be used) for the duration of the COVID-19 declaration under Section 564(b)(1) of the Act, 21 U.S.C. section 360bbb-3(b)(1), unless the authorization is terminated or revoked.  Performed at [facility], [address], [place], [place] [address]   Culture, blood (routine x 2)     Status: None   Collection Time: 03/05/23  4:33 PM   Specimen: BLOOD LEFT FOREARM  Result Value Ref Range Status   Specimen Description BLOOD LEFT FOREARM  Final   Special Requests   Final     BOTTLES DRAWN AEROBIC AND ANAEROBIC Blood Culture results may not be optimal due to an excessive volume of blood received in culture bottles   Culture   Final    NO GROWTH 5 DAYS Performed at [facility], [address] [place], [place] [address]    Report Status 03/10/2023 FINAL  Final  MRSA Next Gen by PCR, Nasal     Status: None   Collection Time: 03/06/23  8:34 PM   Specimen: Nasal Mucosa; Nasal Swab  Result Value Ref Range Status   MRSA by PCR Next Gen NOT DETECTED NOT DETECTED Final    Comment: (NOTE) The GeneXpert MRSA Assay (FDA approved for NASAL specimens only), is one component of a comprehensive MRSA colonization surveillance program. It is not intended to diagnose MRSA infection nor to guide or monitor treatment for MRSA infections. Test performance is not FDA approved in patients less than 31 years old. Performed at [facility], [address], [place], [place] [address]     Studies/Results: DG Chest Port 1 View  Result Date: 03/10/2023 CLINICAL DATA:  Respiratory failure, ARDS and pulmonary embolism. EXAM: PORTABLE CHEST 1 VIEW COMPARISON:  03/09/2023 FINDINGS: Stable heart size and aortic prominence. Increased peripheral pulmonary consolidative opacity in both upper lobes. Stable additional interstitial infiltrates in both lungs. No visualized pleural fluid or pneumothorax. IMPRESSION: Increased peripheral pulmonary consolidative opacity in both upper lobes. Stable additional interstitial infiltrates in both lungs. Stable heart size and aortic prominence. Electronically Signed   By: Irish Lack M.D.   On: 03/10/2023  11:21   DG CHEST PORT 1 VIEW  Result Date: 03/09/2023 CLINICAL DATA:  Respiratory failure EXAM: PORTABLE CHEST 1 VIEW COMPARISON:  Chest radiograph 03/07/2023 FINDINGS: Right arm PICC terminates at the cavoatrial junction. No pleural effusion. No pneumothorax. Redemonstrated are extensive bilateral interstitial opacities with a more focal  airspace opacity at the left lung base, stable to slightly improved from 03/07/2023. Unchanged cardiac and mediastinal contours. No radiographically apparent displaced rib fractures. Visualized upper abdomen is unremarkable. IMPRESSION: Redemonstrated diffuse bilateral interstitial opacities with slight interval improvement in superimposed bilateral patchy airspace opacities. Electronically Signed   By: Lorenza Cambridge M.D.   On: 03/09/2023 10:08      Assessment/Plan:  INTERVAL HISTORY: Worsening oxygen requirements worsening cough worsening delirium  Principal Problem:   Acute pulmonary embolism Active Problems:   Hyperlipidemia   PMR (polymyalgia rheumatica)   AAA (abdominal aortic aneurysm) without rupture (HCC)   Generalized weakness   Acute hypoxic respiratory failure   Acute hyponatremia   Elevated troponin   Lactic acidosis   HCAP (healthcare-associated pneumonia)   Chronic diastolic CHF (congestive heart failure)   BPH (benign prostatic hyperplasia)   Pulmonary embolism   Eosinophilic pneumonia   Aortitis   Atrial flutter   FUO (fever of unknown origin)   S/P insertion of endovascular thoracic aortic stent graft   Drug-induced insomnia   Anxiety   DVT, lower extremity, distal, acute, right   PAF (paroxysmal atrial fibrillation)    KARANDEEP RESENDE is a 89 y.o. male with complicated medical history including aortic aneurysm status post placement of stent graft, polymyalgia rheumatica that has been well-controlled on prednisone who has been experiencing malaise for nearly 8 months with intermittent fevers and elevated inflammatory markers, who was worked up for this condition by infectious disease at [facility] including PET scan that showed increased uptake in the aorta near stent graft with question of aortitis.  The tagged white cell scan subsequently did not show uptake in this area or anywhere else in the body.  I reviewed the case with Dr.Julia Dolores Frame yesterday over  the phone and she stated that they had reviewed the PET scan again with radiology and that radiology is not entirely convinced of the uptake being particularly dramatic.  Regardless the patient was the begun on empiric antibiotic regimen for possible bacterial infectious aortitis with daptomycin and ceftriaxone.  The plan was to see how the patient did on this including monitoring his inflammatory markers.  All along however there has been suspicion that there might be an autoimmune process involved and the patient is followed closely by Dr. Alberteen Spindle with nephrology at [facility].  He has now been admitted to [facility] with an acute pulmonary embolism and a CT angiogram that also shows multifocal infiltrates.  He is experienced progressive hypoxemic respiratory failure requiring high flow oxygen currently on high flow oxygen via nasal cannula.  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  His antibiotics switched to vancomycin and cefepime having been moved from daptomycin ceftriaxone disease.  CCM now gotten rid of cefpime in favor of zosyn due to concerns for CNS side effects of cefepime/  #1  Pulmonary embolism with heart strain and DVT identified in the right lower extremity  On anticoagulation  #2 Pneumonia: favor eosinophilic PNA vs perhaps more likely declaration of autoimmune pulmonary pathology  I have been following him  since admission and he had IMPROVED on high dose steroids apart from his anxiety though it was better yesterday as well  His deterioration appears to ME to coincide with abrupt reduction of steroids  No doubt he could have component of pulmonary edema and right heart strain  I dont think the steroids caused his delirium but I am not an expert here. He was best I had seen yesterday morning while still on high  dose steroids  I would favor returning to them esp in light of his apparent undiagnosed underlying auto--immune pathology  Obviously this is not my call  Zosyn and vancomycin are reasonable for abx though higher risk of nephrotoxicity. If this becomes an issue would drop the vancomycin in favor of doxycline po    #3 ? Aortitis: I would plan on him continuing with antibiotics to complete 6 weeks that I would not go back to daptomycin in case it was causing eosinophilic pneumonia and I would prefer doxycycline orally to systemic vancomycin at discharge.  We will hold off on, with a final antibiotic plan at this time.  I have my suspicion is that the aortitis is a " "red herring" he has a different underlying autoimmune pathology that was responsible for his elevated inflammatory markers  #4  Suspected underlying autoimmune disease potentially with pulmonary complications and patient with known polymyalgia rheumatica:  He is on corticosteroids and will be interesting to see if he improves globally if he makes it out of the ICU and onto the floor and ultimately back home.    #5 Delirium: he was definitely anxious past few days but now he is indeed having delirium. I certainly hope ALL of this turns around  #6  Atrial  fibrillation: going to beta blocker an perhaps back to amiodarone   #7 GOC: when I spoke to patient and his wife a few days ago they were still weighing elements of code status  I have personally spent 56 minutes involved in face-to-face and non-face-to-face activities for this patient on the day of the visit. Professional time spent includes the following activities: Preparing to see the patient (review of tests), Obtaining and/or reviewing separately obtained history (admission/discharge record), Performing a medically appropriate examination and/or evaluation , Ordering medications/tests/procedures, referring and communicating with other health care professionals, Documenting  clinical information in the EMR, Independently interpreting results (not separately reported), Communicating results to the patient/family/caregiver, Counseling and educating the patient/family/caregiver and Care coordination (not separately reported).   My partner Dr. Earlene Plater is on over the weekend and will check in on the patient.     LOS: 4 days   Acey Lav 03/10/2023, 3:11 PM

## 2023-03-10 NOTE — Progress Notes (Signed)
Pharmacy Antibiotic Note  [name] is a 89 y.o. male admitted on 03/05/2023 with infectious vs inflammatory aortitis and pneumonia, ID following. Pharmacy has been consulted for vancomycin and Zosyn dosing.  Patient with increasing confusion this morning, concerning for delirium. WBC increasing, has been on high-dose steroids while admitted and with chronic leukocytosis PTA. CCM to switch cefepime to Zosyn to avoid any further neurotoxicity that may contribute to delirium. Will obtain vancomycin levels later this afternoon.  Patient has documented intolerance to amoxicillin. Patient's wife confirmed patient experienced nausea after taking on an empty stomach.  Plan: Stop cefepime 2g IV q12 hours Continue Vancomycin 1250mg  q24 hours Start Zosyn 3.375g IV q8 hours Bactrim for PJP prophylaxis  Check vancomycin peak and trough today (4/12) Monitor cultures, clinical status, renal function  Height: 6' (182.9 cm) Weight: 79.8 kg (175 lb 14.8 oz) IBW/kg (Calculated) : 77.6  Temp (24hrs), Avg:98.1 F (36.7 C), Min:97.7 F (36.5 C), Max:98.3 F (36.8 C)  Recent Labs  Lab 03/05/23 1614 03/05/23 1622 03/06/23 0633 03/06/23 0820 03/06/23 1057 03/07/23 0240 03/08/23 0220 03/09/23 0143 03/10/23 0324  WBC 20.0*  --  22.0*  --   --  26.1* 27.5* 27.8* 31.5*  CREATININE 1.17   < > 1.13  --   --  1.04 0.89 0.83 1.01  LATICACIDVEN 2.6*  --   --  2.4* 1.6  --   --   --   --    < > = values in this interval not displayed.     Estimated Creatinine Clearance: 53.4 mL/min (by C-G formula based on SCr of 1.01 mg/dL).    Allergies  Allergen Reactions   Amoxicillin Other (See Comments)   Codeine Nausea And Vomiting   Doxycycline Other (See Comments)    Upset stomach, currently taking and tolerating well     Morphine And Related Nausea And Vomiting   Antimicrobials this admission: cefepime 4/7 >> 4/12 daptomycin 4/7 x1 Bactrim prophylaxis 4/8 >>  Vancomycin 4/8 >>  Zosyn 4/12 >>    Dose adjustments this admission: N/a  Microbiology results: 4/7 BCx: NGTD 4/7 RVP: neg 4/8 MRSA PCR: neg  Thank you for allowing pharmacy to be a part of this patient's care.  Rockwell Alexandria, PharmD, [facility] PGY1 Pharmacy Resident 03/10/2023 11:21 AM

## 2023-03-11 DIAGNOSIS — J189 Pneumonia, unspecified organism: Secondary | ICD-10-CM | POA: Diagnosis not present

## 2023-03-11 DIAGNOSIS — I480 Paroxysmal atrial fibrillation: Secondary | ICD-10-CM

## 2023-03-11 DIAGNOSIS — J8281 Chronic eosinophilic pneumonia: Secondary | ICD-10-CM | POA: Diagnosis not present

## 2023-03-11 DIAGNOSIS — J9601 Acute respiratory failure with hypoxia: Secondary | ICD-10-CM | POA: Diagnosis not present

## 2023-03-11 DIAGNOSIS — F19982 Other psychoactive substance use, unspecified with psychoactive substance-induced sleep disorder: Secondary | ICD-10-CM | POA: Diagnosis not present

## 2023-03-11 DIAGNOSIS — I2609 Other pulmonary embolism with acute cor pulmonale: Secondary | ICD-10-CM | POA: Diagnosis not present

## 2023-03-11 DIAGNOSIS — M353 Polymyalgia rheumatica: Secondary | ICD-10-CM | POA: Diagnosis not present

## 2023-03-11 DIAGNOSIS — I776 Arteritis, unspecified: Secondary | ICD-10-CM | POA: Diagnosis not present

## 2023-03-11 LAB — CBC WITH DIFFERENTIAL/PLATELET
Abs Immature Granulocytes: 0.2 10*3/uL — ABNORMAL HIGH (ref 0.00–0.07)
Basophils Absolute: 0 10*3/uL (ref 0.0–0.1)
Basophils Relative: 0 %
Eosinophils Absolute: 0 10*3/uL (ref 0.0–0.5)
Eosinophils Relative: 0 %
HCT: 27.9 % — ABNORMAL LOW (ref 39.0–52.0)
Hemoglobin: 9.2 g/dL — ABNORMAL LOW (ref 13.0–17.0)
Immature Granulocytes: 1 %
Lymphocytes Relative: 2 %
Lymphs Abs: 0.5 10*3/uL — ABNORMAL LOW (ref 0.7–4.0)
MCH: 31.4 pg (ref 26.0–34.0)
MCHC: 33 g/dL (ref 30.0–36.0)
MCV: 95.2 fL (ref 80.0–100.0)
Monocytes Absolute: 0.3 10*3/uL (ref 0.1–1.0)
Monocytes Relative: 1 %
Neutro Abs: 21.6 10*3/uL — ABNORMAL HIGH (ref 1.7–7.7)
Neutrophils Relative %: 96 %
Platelets: 283 10*3/uL (ref 150–400)
RBC: 2.93 MIL/uL — ABNORMAL LOW (ref 4.22–5.81)
RDW: 16.6 % — ABNORMAL HIGH (ref 11.5–15.5)
WBC: 22.6 10*3/uL — ABNORMAL HIGH (ref 4.0–10.5)
nRBC: 0 % (ref 0.0–0.2)

## 2023-03-11 LAB — HEPATIC FUNCTION PANEL
ALT: 39 U/L (ref 0–44)
AST: 23 U/L (ref 15–41)
Albumin: 2.5 g/dL — ABNORMAL LOW (ref 3.5–5.0)
Alkaline Phosphatase: 78 U/L (ref 38–126)
Bilirubin, Direct: 0.2 mg/dL (ref 0.0–0.2)
Indirect Bilirubin: 0.6 mg/dL (ref 0.3–0.9)
Total Bilirubin: 0.8 mg/dL (ref 0.3–1.2)
Total Protein: 5.6 g/dL — ABNORMAL LOW (ref 6.5–8.1)

## 2023-03-11 LAB — BASIC METABOLIC PANEL
Anion gap: 10 (ref 5–15)
BUN: 32 mg/dL — ABNORMAL HIGH (ref 8–23)
CO2: 25 mmol/L (ref 22–32)
Calcium: 7.3 mg/dL — ABNORMAL LOW (ref 8.9–10.3)
Chloride: 102 mmol/L (ref 98–111)
Creatinine, Ser: 1.03 mg/dL (ref 0.61–1.24)
GFR, Estimated: >60 mL/min (ref >60)
Glucose, Bld: 160 mg/dL — ABNORMAL HIGH (ref 70–99)
Potassium: 4.3 mmol/L (ref 3.5–5.1)
Sodium: 137 mmol/L (ref 135–145)

## 2023-03-11 LAB — GLUCOSE, CAPILLARY
Glucose-Capillary: 138 mg/dL — ABNORMAL HIGH (ref 70–99)
Glucose-Capillary: 148 mg/dL — ABNORMAL HIGH (ref 70–99)
Glucose-Capillary: 145 mg/dL — ABNORMAL HIGH (ref 70–99)
Glucose-Capillary: 123 mg/dL — ABNORMAL HIGH (ref 70–99)

## 2023-03-11 LAB — MAGNESIUM: Magnesium: 2.2 mg/dL (ref 1.7–2.4)

## 2023-03-11 MED ORDER — FUROSEMIDE 10 MG/ML IJ SOLN
40.0000 mg | Freq: Four times a day (QID) | INTRAMUSCULAR | Status: AC
  Administered 2023-03-11 (×2): 40 mg via INTRAVENOUS
  Filled 2023-03-11 (×2): qty 4

## 2023-03-11 NOTE — Progress Notes (Signed)
[facility]  Date of Admission:  03/05/2023            Current antibiotics: Vancomycin Zosyn   ASSESSMENT:    89 y.o. male admitted with:  Pulmonary embolism with right heart strain and DVT Pneumonia with suspected eosinophilic pneumonia versus declaration of autoimmune pulmonary disease ?  Aortitis Suspected underlying autoimmune disease with known polymyalgia rheumatica Delirium Atrial fibrillation   RECOMMENDATIONS:    Continue vancomycin and piperacillin tazobactam as is for today Defer steroid and diuresis management to PCCM Creatinine remained stable on piperacillin tazobactam and vancomycin combination Monitor closely and if any worsening, plan for stopping vancomycin in favor of doxycycline Dr. Daiva Eves will return on Monday   Principal Problem:   Acute pulmonary embolism Active Problems:   Hyperlipidemia   PMR (polymyalgia rheumatica)   AAA (abdominal aortic aneurysm) without rupture (HCC)   Generalized weakness   Acute hypoxic respiratory failure   Acute hyponatremia   Elevated troponin   Lactic acidosis   HCAP (healthcare-associated pneumonia)   Chronic diastolic CHF (congestive heart failure)   BPH (benign prostatic hyperplasia)   Pulmonary embolism   Eosinophilic pneumonia   Aortitis   Atrial flutter   FUO (fever of unknown origin)   S/P insertion of endovascular thoracic aortic stent graft   Drug-induced insomnia   Anxiety   DVT, lower extremity, distal, acute, right   PAF (paroxysmal atrial fibrillation)    MEDICATIONS:    Scheduled Meds:  apixaban  10 mg Oral BID   Followed by   Melene Muller ON 03/14/2023] apixaban  5 mg Oral BID   Chlorhexidine Gluconate Cloth  6 each Topical Daily   fluorometholone  1 drop Right Eye QHS   folic acid  1 mg Oral Daily   furosemide  40 mg Intravenous Q6H   insulin aspart  0-15 Units Subcutaneous TID WC   insulin aspart  0-5 Units Subcutaneous QHS   melatonin  3 mg Oral QHS    methylPREDNISolone (SOLU-MEDROL) injection  40 mg Intravenous Q12H   metoprolol tartrate  12.5 mg Oral BID   pantoprazole  40 mg Oral Daily   QUEtiapine  25 mg Oral QHS   rosuvastatin  5 mg Oral QHS   senna-docusate  1 tablet Oral BID   sodium chloride flush  10-40 mL Intracatheter Q12H   sulfamethoxazole-trimethoprim  1 tablet Oral Once per day on Mon Wed Fri   traZODone  100 mg Oral QHS   Continuous Infusions:  sodium chloride Stopped (03/10/23 1127)   dexmedetomidine (PRECEDEX) IV infusion 0.6 mcg/kg/hr (03/11/23 0800)   piperacillin-tazobactam (ZOSYN)  IV 12.5 mL/hr at 03/11/23 0800   vancomycin Stopped (03/11/23 0100)   PRN Meds:.sodium chloride, acetaminophen **OR** acetaminophen, hydrOXYzine, lidocaine, menthol-cetylpyridinium, ondansetron (ZOFRAN) IV, mouth rinse, sodium chloride flush  SUBJECTIVE:   24 hour events:  Patient is afebrile, Tmax 97.9 His bedside nurse reports ongoing diuresis and improved oxygen saturations, however, he does not tolerate much activity out of the bed He is less anxious and more calm today His son is at the bedside  Patient is sleeping.  He briefly awakened but was easily redirected by his son.     OBJECTIVE:   Blood pressure 125/67, pulse 65, temperature 97.7 F (36.5 C), temperature source Axillary, resp. rate 20, height 6' (1.829 m), weight 77.1 kg, SpO2 92 %. Body mass index is 23.05 kg/m.  Physical Exam Constitutional:      Comments: Elderly man, lying calmly in  bed, appears to be resting comfortably.  He is in no acute distress.  HENT:     Head: Normocephalic and atraumatic.  Eyes:     Extraocular Movements: Extraocular movements intact.     Conjunctiva/sclera: Conjunctivae normal.  Pulmonary:     Comments: He is currently on high flow nasal cannula and appears to be breathing relatively comfortably lying in the bed Abdominal:     General: There is no distension.     Palpations: Abdomen is soft.  Skin:    General: Skin  is warm and dry.      Lab Results: Lab Results  Component Value Date   WBC 22.6 (H) 03/11/2023   HGB 9.2 (L) 03/11/2023   HCT 27.9 (L) 03/11/2023   MCV 95.2 03/11/2023   PLT 283 03/11/2023    Lab Results  Component Value Date   NA 137 03/11/2023   K 4.3 03/11/2023   CO2 25 03/11/2023   GLUCOSE 160 (H) 03/11/2023   BUN 32 (H) 03/11/2023   CREATININE 1.03 03/11/2023   CALCIUM 7.3 (L) 03/11/2023   GFRNONAA >60 03/11/2023   GFRAA >60 01/24/2018    Lab Results  Component Value Date   ALT 39 03/11/2023   AST 23 03/11/2023   ALKPHOS 78 03/11/2023   BILITOT 0.8 03/11/2023       Component Value Date/Time   CRP 29.0 (H) 03/06/2023 2140       Component Value Date/Time   ESRSEDRATE 109 (H) 03/10/2023 1133     I have reviewed the micro and lab results in Epic.  Imaging: DG Chest Port 1 View  Result Date: 03/10/2023 CLINICAL DATA:  Respiratory failure, ARDS and pulmonary embolism. EXAM: PORTABLE CHEST 1 VIEW COMPARISON:  03/09/2023 FINDINGS: Stable heart size and aortic prominence. Increased peripheral pulmonary consolidative opacity in both upper lobes. Stable additional interstitial infiltrates in both lungs. No visualized pleural fluid or pneumothorax. IMPRESSION: Increased peripheral pulmonary consolidative opacity in both upper lobes. Stable additional interstitial infiltrates in both lungs. Stable heart size and aortic prominence. Electronically Signed   By: Irish Lack M.D.   On: 03/10/2023 11:21     Imaging independently reviewed in Epic.    Vedia Coffer for Infectious Disease [facility] [phone] pager 03/11/2023, 12:45 PM

## 2023-03-11 NOTE — Progress Notes (Signed)
NAME:  [name], MRN:  [id number], DOB:  [birth year], LOS: 5 ADMISSION DATE:  03/05/2023, CONSULTATION DATE: 4/8  REFERRING MD:  Daiva Eves CHIEF COMPLAINT:  Generalized Weakness   History of Present Illness:  89 yr old male with significant pmhx of polymyalgia rheumatica on chronic steroids, recent hospitalization for aortitis, CHF, chronic leukocytosis, AAA with S/P stent graft in 2018, HLD, and DDD was admitted with an acute PE at [facility] ED and initially complaining of generalized weakness on 4/7. Patient developed SVT on the morning of 4/8 along with respiratory distress and hypoxia requiring non-re breather mask. Patient being managed by [facility], Cardiology, and Infectious Disease. PCCM consulted by ID to assist in managing the acute hypoxic respiratory failure.   Pertinent  Medical History   Past Medical History:  Diagnosis Date   AAA (abdominal aortic aneurysm)    Abnormal EKG    Left atrial abnormality   Allergic rhinitis    Allergy    Benign neoplasm of colon 04/14/10   3 small polyps on colonoscopy by Dr. Marina Goodell   Carpal tunnel syndrome    Cataract    Degenerative disc disease    Disturbances metabolism of methionine, homocystine, and cystathionine    Elevated homocysteine   Elevated prostate specific antigen (PSA)    Hearing loss    Hyperlipidemia    Impotence of organic origin    Penile implant   Internal hemorrhoids    Neck pain    Otosclerosis of both ears    Peripheral vascular disease    Bilateral femoral bruit   Plantar fasciitis    Polymyalgia rheumatica    Rotator cuff syndrome of left shoulder    Scoliosis    Shoulder pain      Significant Hospital Events: Including procedures, antibiotic start and stop dates in addition to other pertinent events   4/7 Admitted for Acute PE 4/8 Developed Rapid Atrial Flutter-HR 170s-180s , along with respiratory failure requiring NRB, PCCM consulted  4/12 feeling better, HHFNC 60%, 45L, steroids tapered  Interim History /  Subjective:  Patient was started on Precedex infusion Still feels anxious FiO2 was titrated down to 50% this morning  Objective   Blood pressure 125/67, pulse 65, temperature 97.7 F (36.5 C), temperature source Axillary, resp. rate 20, height 6' (1.829 m), weight 77.1 kg, SpO2 92 %.    FiO2 (%):  [65 %-85 %] 65 %   Intake/Output Summary (Last 24 hours) at 03/11/2023 1032 Last data filed at 03/11/2023 0800 Gross per 24 hour  Intake 1165.56 ml  Output 3125 ml  Net -1959.44 ml   Filed Weights   03/07/23 0440 03/08/23 0600 03/11/23 0500  Weight: 80 kg 79.8 kg 77.1 kg   Examination: Physical exam: General: Acute on chronically ill-appearing male, lying on the bed HEENT: [place]/AT, eyes anicteric.  moist mucus membranes Neuro: Sleepy, opens eyes with vocal stimuli, following commands Chest: Bilateral crackles right more than left, no wheezes or rhonchi Heart: Regular rate and rhythm, no murmurs or gallops Abdomen: Soft, nontender, nondistended, bowel sounds present Skin: No rash  UOP 3.3l Net -2L Afebrile Labs and images were reviewed  Resolved Hospital Problem list   Hyponatremia  Assessment & Plan:  Acute Hypoxic Respiratory Failure in setting possibly of multifocal pna vs eosinophilic pna/ ILD/ autoimmune process  Acute PE with RLE DVT PMR on chronic prednisone, immunocompromised  Received 3 days of high dose steroids started 4/8 PM Now tapering down, currently on 40 mg twice daily Continue IV  antibiotics with vancomycin and Zosyn FiO2 was titrated down to 50% on 40 L high flow nasal cannula oxygen Fungitell, HSP, pneumocystis pcr pending.  RA noted at 26 otherwise autoimmune panel neg Continue apixaban, not a candidate for bronchoscopy considering high oxygen requirement  Acute hyperactive delirium Patient is anxious, agitated and restless He was started on Precedex with improvement, continue Precedex with RASS goal -1 Continue trazodone, hydroxyzine and  melatonin Continue delirium precautions  Paroxysmal atrial flutter/atrial fibrillation Remain in sinus rhythm Amiodarone was stopped On therapeutic anticoag patient optimize electrolytes K and Mag EF normal EF with no wall motion abnormalities and diastolic parameters are normal  Hyperlipidemia Continue Crestor  BPH Continue tamsulosin  AAA s/p aortic graft Possible aortitis ABX per ID.  Dapto stopped 4/7 for concern of eosinophilic PNA.  Followed at [facility] ID  Prediabetes, steroid induced hyperglycemia  Patient hemoglobin A1c 6.3 Plan continue sliding scale insulin with CBG goal 140-180  Best Practice (right click and "Reselect all SmartList Selections" daily)   Diet/type: Regular consistency DVT prophylaxis: Apixaban GI prophylaxis: PPI Lines: N/A Foley:  N/A Code Status:  full code Last date of multidisciplinary goals of care discussion [4/13: Patient, his wife and son  updated at beside, decision was to continue full scope of care].  He would like to go for ventilator if needed as last resort for short-term only.   This patient is critically ill with multiple organ system failure which requires frequent high complexity decision making, assessment, support, evaluation, and titration of therapies. This was completed through the application of advanced monitoring technologies and extensive interpretation of multiple databases.  During this encounter critical care time was devoted to patient care services described in this note for 33 minutes.    Cheri Fowler, MD [place] [facility] See Amion for pager If no response to pager, please call [phone] until 7pm After 7pm, Please call E-link [phone]

## 2023-03-12 ENCOUNTER — Inpatient Hospital Stay (HOSPITAL_COMMUNITY): Payer: Medicare Other

## 2023-03-12 DIAGNOSIS — J9601 Acute respiratory failure with hypoxia: Secondary | ICD-10-CM | POA: Diagnosis not present

## 2023-03-12 DIAGNOSIS — R410 Disorientation, unspecified: Secondary | ICD-10-CM | POA: Diagnosis not present

## 2023-03-12 LAB — BASIC METABOLIC PANEL
Anion gap: 10 (ref 5–15)
BUN: 37 mg/dL — ABNORMAL HIGH (ref 8–23)
CO2: 25 mmol/L (ref 22–32)
Calcium: 7.6 mg/dL — ABNORMAL LOW (ref 8.9–10.3)
Chloride: 101 mmol/L (ref 98–111)
Creatinine, Ser: 1.09 mg/dL (ref 0.61–1.24)
GFR, Estimated: >60 mL/min (ref >60)
Glucose, Bld: 176 mg/dL — ABNORMAL HIGH (ref 70–99)
Potassium: 3.7 mmol/L (ref 3.5–5.1)
Sodium: 136 mmol/L (ref 135–145)

## 2023-03-12 LAB — GLUCOSE, CAPILLARY
Glucose-Capillary: 175 mg/dL — ABNORMAL HIGH (ref 70–99)
Glucose-Capillary: 138 mg/dL — ABNORMAL HIGH (ref 70–99)
Glucose-Capillary: 166 mg/dL — ABNORMAL HIGH (ref 70–99)
Glucose-Capillary: 163 mg/dL — ABNORMAL HIGH (ref 70–99)

## 2023-03-12 LAB — MAGNESIUM: Magnesium: 2.2 mg/dL (ref 1.7–2.4)

## 2023-03-12 LAB — CBC WITH DIFFERENTIAL/PLATELET
Abs Immature Granulocytes: 0.27 10*3/uL — ABNORMAL HIGH (ref 0.00–0.07)
Basophils Absolute: 0.1 10*3/uL (ref 0.0–0.1)
Basophils Relative: 0 %
Eosinophils Absolute: 0.1 10*3/uL (ref 0.0–0.5)
Eosinophils Relative: 0 %
HCT: 32.1 % — ABNORMAL LOW (ref 39.0–52.0)
Hemoglobin: 10.8 g/dL — ABNORMAL LOW (ref 13.0–17.0)
Immature Granulocytes: 1 %
Lymphocytes Relative: 3 %
Lymphs Abs: 0.8 10*3/uL (ref 0.7–4.0)
MCH: 31.3 pg (ref 26.0–34.0)
MCHC: 33.6 g/dL (ref 30.0–36.0)
MCV: 93 fL (ref 80.0–100.0)
Monocytes Absolute: 0.5 10*3/uL (ref 0.1–1.0)
Monocytes Relative: 2 %
Neutro Abs: 24.7 10*3/uL — ABNORMAL HIGH (ref 1.7–7.7)
Neutrophils Relative %: 94 %
Platelets: 355 10*3/uL (ref 150–400)
RBC: 3.45 MIL/uL — ABNORMAL LOW (ref 4.22–5.81)
RDW: 16.5 % — ABNORMAL HIGH (ref 11.5–15.5)
WBC: 26.4 10*3/uL — ABNORMAL HIGH (ref 4.0–10.5)
nRBC: 0 % (ref 0.0–0.2)

## 2023-03-12 LAB — PHOSPHORUS: Phosphorus: 2.7 mg/dL (ref 2.5–4.6)

## 2023-03-12 MED ORDER — TRAZODONE HCL 50 MG PO TABS: 100.0000 mg | ORAL_TABLET | Freq: Once | ORAL | Status: DC

## 2023-03-12 MED ORDER — ENSURE ENLIVE PO LIQD
237.0000 mL | Freq: Three times a day (TID) | ORAL | Status: DC
  Administered 2023-03-12 – 2023-03-14 (×5): 237 mL via ORAL
  Administered 2023-03-14: 120 mL via ORAL
  Administered 2023-03-15 – 2023-03-23 (×13): 237 mL via ORAL

## 2023-03-12 MED ORDER — ALTEPLASE 2 MG IJ SOLR: 2.0000 mg | Freq: Once | INTRAMUSCULAR | Status: DC

## 2023-03-12 MED ORDER — LACTULOSE 10 GM/15ML PO SOLN
20.0000 g | Freq: Three times a day (TID) | ORAL | Status: DC
  Administered 2023-03-12: 20 g via ORAL
  Filled 2023-03-12: qty 30

## 2023-03-12 MED ORDER — TRAZODONE HCL 100 MG PO TABS
200.0000 mg | ORAL_TABLET | Freq: Every day | ORAL | Status: DC
  Administered 2023-03-13 – 2023-03-22 (×10): 200 mg via ORAL
  Filled 2023-03-12 (×2): qty 4
  Filled 2023-03-12 (×7): qty 2
  Filled 2023-03-12: qty 4

## 2023-03-12 NOTE — Progress Notes (Signed)
eLink Physician-Brief Progress Note Patient Name: [name] DOB: 1930/01/06 MRN: [id number]   Date of Service  03/12/2023  HPI/Events of Note  Max on 0.6 of precedex. Increasingly anxious and have no PRNS left to give. Can we increase precedex gtt?   eICU Interventions  Camera: Resting on HFNC.  Discussed with RN, now he is sleeping.  VS stable.   Increased ceoling to 1, as needed to titrate up     Intervention Category Intermediate Interventions: Other:  Ranee Gosselin 03/12/2023, 1:34 AM

## 2023-03-12 NOTE — Progress Notes (Signed)
NAME:  [name], MRN:  [id number], DOB:  Jun 10, 1930, LOS: 6 ADMISSION DATE:  03/05/2023, CONSULTATION DATE: 4/8  REFERRING MD:  Daiva Eves CHIEF COMPLAINT:  Generalized Weakness   History of Present Illness:  89 yr old male with significant pmhx of polymyalgia rheumatica on chronic steroids, recent hospitalization for aortitis, CHF, chronic leukocytosis, AAA with S/P stent graft in 2018, HLD, and DDD was admitted with an acute PE at [facility] ED and initially complaining of generalized weakness on 4/7. Patient developed SVT on the morning of 4/8 along with respiratory distress and hypoxia requiring non-re breather mask. Patient being managed by [facility], Cardiology, and Infectious Disease. PCCM consulted by ID to assist in managing the acute hypoxic respiratory failure.   Pertinent  Medical History   Past Medical History:  Diagnosis Date   AAA (abdominal aortic aneurysm)    Abnormal EKG    Left atrial abnormality   Allergic rhinitis    Allergy    Benign neoplasm of colon 04/14/10   3 small polyps on colonoscopy by Dr. Marina Goodell   Carpal tunnel syndrome    Cataract    Degenerative disc disease    Disturbances metabolism of methionine, homocystine, and cystathionine    Elevated homocysteine   Elevated prostate specific antigen (PSA)    Hearing loss    Hyperlipidemia    Impotence of organic origin    Penile implant   Internal hemorrhoids    Neck pain    Otosclerosis of both ears    Peripheral vascular disease    Bilateral femoral bruit   Plantar fasciitis    Polymyalgia rheumatica    Rotator cuff syndrome of left shoulder    Scoliosis    Shoulder pain      Significant Hospital Events: Including procedures, antibiotic start and stop dates in addition to other pertinent events   4/7 Admitted for Acute PE 4/8 Developed Rapid Atrial Flutter-HR 170s-180s , along with respiratory failure requiring NRB, PCCM consulted  4/12 feeling better, HHFNC 60%, 45L, steroids tapered  Interim History /  Subjective:  Patient remains on Precedex infusion Stated feeling less anxious and feeling better FiO2 was titrated down to 50% and 30 L  Objective   Blood pressure 108/80, pulse 85, temperature (!) 97.5 F (36.4 C), temperature source Oral, resp. rate (!) 23, height 6' (1.829 m), weight 77.1 kg, SpO2 95 %.    FiO2 (%):  [37 %-48 %] 48 %   Intake/Output Summary (Last 24 hours) at 03/12/2023 1413 Last data filed at 03/12/2023 1206 Gross per 24 hour  Intake 660.2 ml  Output 2450 ml  Net -1789.8 ml   Filed Weights   03/07/23 0440 03/08/23 0600 03/11/23 0500  Weight: 80 kg 79.8 kg 77.1 kg   Examination: Physical exam: General: Acute on chronically ill-appearing male, lying on the bed HEENT: [place]/AT, eyes anicteric.  moist mucus membranes Neuro: Alert, awake following commands Chest: Faint bilateral basal crackles, no wheezes or rhonchi Heart: Regular rate and rhythm, no murmurs or gallops Abdomen: Soft, nontender, nondistended, bowel sounds present Skin: No rash  UOP 3.l Net -2.1L Afebrile Labs and images were reviewed  Resolved Hospital Problem list   Hyponatremia  Assessment & Plan:  Acute Hypoxic Respiratory Failure in setting possibly of multifocal pna vs eosinophilic pna/ ILD/ autoimmune process  Acute PE with RLE DVT PMR on chronic prednisone, immunocompromised  Received 3 days of high dose steroids, completed 4/10 Currently on methylprednisone 40 mg twice daily, slowly taper it off Continue  IV antibiotics with vancomycin and Zosyn FiO2 was titrated down to 50% on 30 L high flow nasal cannula oxygen Fungitell, HSP, pneumocystis pcr pending.  RA noted at 26 otherwise autoimmune panel neg Continue apixaban, not a candidate for bronchoscopy considering high oxygen requirement Holding diuretics for today as serum creatinine started slowly trending up  Acute hyperactive delirium Patient feels less anxious Continue Precedex for now with RASS goal -1 Continue trazodone,  hydroxyzine and melatonin Continue delirium precautions  Paroxysmal atrial flutter/atrial fibrillation Remain in sinus rhythm Amiodarone was stopped On therapeutic anticoagulation with apixaban optimize electrolytes K and Mag EF normal EF with no wall motion abnormalities and diastolic parameters are normal  Hyperlipidemia Continue Crestor  BPH Continue tamsulosin  AAA s/p aortic graft Possible aortitis ABX per ID.  Dapto stopped 4/7 for concern of eosinophilic PNA.  Followed at [facility] ID Currently on vancomycin and Zosyn  Prediabetes, steroid induced hyperglycemia  Patient hemoglobin A1c 6.3 Plan continue sliding scale insulin with CBG goal 140-180  Best Practice (right click and "Reselect all SmartList Selections" daily)   Diet/type: Regular consistency DVT prophylaxis: Apixaban GI prophylaxis: PPI Lines: N/A Foley:  N/A Code Status:  full code Last date of multidisciplinary goals of care discussion [4/13: Patient, his wife and son  updated at beside, decision was to continue full scope of care].  He would like to go for ventilator if needed as last resort for short-term only.   This patient is critically ill with multiple organ system failure which requires frequent high complexity decision making, assessment, support, evaluation, and titration of therapies. This was completed through the application of advanced monitoring technologies and extensive interpretation of multiple databases.  During this encounter critical care time was devoted to patient care services described in this note for 31 minutes.    Cheri Fowler, MD [facility] Critical [facility] See Amion for pager If no response to pager, please call [phone] until 7pm After 7pm, Please call E-link [phone]

## 2023-03-12 NOTE — Progress Notes (Signed)
eLink Physician-Brief Progress Note Patient Name: [name] DOB: 26-Mar-1930 MRN: [id number]   Date of Service  03/12/2023  HPI/Events of Note  89 year old male in ICU with acute hypoxemic respiratory failure and acute delirium.  Already on high-dose of dexmedetomidine.  Also on trazodone 100 mg at bedtime.  Currently sleeping but wakes up intermittently with delirium and agitation.  Patient reviewed and discussed with bedside nurse.  eICU Interventions  Will give an extra 100 mg of trazodone by mouth if patient wakes up again and becomes agitated and then increase his nighttime dose to 200 mg.  Will avoid benzodiazepines in this 89 year old as it can cause worsening delirium.     Intervention Category Major Interventions: Delirium, psychosis, severe agitation - evaluation and management  Carilyn Goodpasture 03/12/2023, 11:08 PM

## 2023-03-12 NOTE — Progress Notes (Signed)
Placed patient on 14lpm salter cannula.Sp02=100%. Heated high flow on standby.

## 2023-03-12 NOTE — Progress Notes (Signed)
Pt has had two full bottles of ensure today.

## 2023-03-13 DIAGNOSIS — I824Z1 Acute embolism and thrombosis of unspecified deep veins of right distal lower extremity: Secondary | ICD-10-CM | POA: Diagnosis not present

## 2023-03-13 DIAGNOSIS — J9601 Acute respiratory failure with hypoxia: Secondary | ICD-10-CM | POA: Diagnosis not present

## 2023-03-13 DIAGNOSIS — R531 Weakness: Secondary | ICD-10-CM | POA: Diagnosis not present

## 2023-03-13 DIAGNOSIS — J8281 Chronic eosinophilic pneumonia: Secondary | ICD-10-CM | POA: Diagnosis not present

## 2023-03-13 DIAGNOSIS — R410 Disorientation, unspecified: Secondary | ICD-10-CM | POA: Diagnosis not present

## 2023-03-13 DIAGNOSIS — I480 Paroxysmal atrial fibrillation: Secondary | ICD-10-CM | POA: Diagnosis not present

## 2023-03-13 DIAGNOSIS — G9341 Metabolic encephalopathy: Secondary | ICD-10-CM | POA: Diagnosis not present

## 2023-03-13 DIAGNOSIS — Z7901 Long term (current) use of anticoagulants: Secondary | ICD-10-CM

## 2023-03-13 DIAGNOSIS — I2609 Other pulmonary embolism with acute cor pulmonale: Secondary | ICD-10-CM | POA: Diagnosis not present

## 2023-03-13 DIAGNOSIS — I2699 Other pulmonary embolism without acute cor pulmonale: Secondary | ICD-10-CM | POA: Diagnosis not present

## 2023-03-13 DIAGNOSIS — Z7189 Other specified counseling: Secondary | ICD-10-CM | POA: Diagnosis not present

## 2023-03-13 DIAGNOSIS — L8990 Pressure ulcer of unspecified site, unspecified stage: Secondary | ICD-10-CM | POA: Insufficient documentation

## 2023-03-13 LAB — GLUCOSE, CAPILLARY
Glucose-Capillary: 148 mg/dL — ABNORMAL HIGH (ref 70–99)
Glucose-Capillary: 187 mg/dL — ABNORMAL HIGH (ref 70–99)
Glucose-Capillary: 148 mg/dL — ABNORMAL HIGH (ref 70–99)
Glucose-Capillary: 132 mg/dL — ABNORMAL HIGH (ref 70–99)

## 2023-03-13 LAB — VANCOMYCIN, PEAK
Vancomycin Pk: 11 ug/mL — ABNORMAL LOW (ref 30–40)
Vancomycin Pk: 33 ug/mL (ref 30–40)

## 2023-03-13 LAB — BASIC METABOLIC PANEL
Anion gap: 8 (ref 5–15)
BUN: 41 mg/dL — ABNORMAL HIGH (ref 8–23)
CO2: 26 mmol/L (ref 22–32)
Calcium: 7.7 mg/dL — ABNORMAL LOW (ref 8.9–10.3)
Chloride: 107 mmol/L (ref 98–111)
Creatinine, Ser: 1.12 mg/dL (ref 0.61–1.24)
GFR, Estimated: >60 mL/min (ref >60)
Glucose, Bld: 162 mg/dL — ABNORMAL HIGH (ref 70–99)
Potassium: 3.4 mmol/L — ABNORMAL LOW (ref 3.5–5.1)
Sodium: 141 mmol/L (ref 135–145)

## 2023-03-13 LAB — CBC
HCT: 29.7 % — ABNORMAL LOW (ref 39.0–52.0)
Hemoglobin: 9.6 g/dL — ABNORMAL LOW (ref 13.0–17.0)
MCH: 30.7 pg (ref 26.0–34.0)
MCHC: 32.3 g/dL (ref 30.0–36.0)
MCV: 94.9 fL (ref 80.0–100.0)
Platelets: 312 10*3/uL (ref 150–400)
RBC: 3.13 MIL/uL — ABNORMAL LOW (ref 4.22–5.81)
RDW: 16.6 % — ABNORMAL HIGH (ref 11.5–15.5)
WBC: 22 10*3/uL — ABNORMAL HIGH (ref 4.0–10.5)
nRBC: 0 % (ref 0.0–0.2)

## 2023-03-13 MED ORDER — METOPROLOL TARTRATE 5 MG/5ML IV SOLN
2.5000 mg | Freq: Once | INTRAVENOUS | Status: AC | PRN
  Administered 2023-03-13: 2.5 mg via INTRAVENOUS
  Filled 2023-03-13: qty 5

## 2023-03-13 MED ORDER — POTASSIUM CHLORIDE CRYS ER 20 MEQ PO TBCR
40.0000 meq | EXTENDED_RELEASE_TABLET | Freq: Once | ORAL | Status: AC
  Administered 2023-03-13: 40 meq via ORAL
  Filled 2023-03-13: qty 2

## 2023-03-13 MED ORDER — VANCOMYCIN HCL 750 MG/150ML IV SOLN
750.0000 mg | Freq: Two times a day (BID) | INTRAVENOUS | Status: DC
  Administered 2023-03-13 – 2023-03-15 (×4): 750 mg via INTRAVENOUS
  Filled 2023-03-13 (×4): qty 150

## 2023-03-13 MED ORDER — PANTOPRAZOLE SODIUM 40 MG PO TBEC
40.0000 mg | DELAYED_RELEASE_TABLET | Freq: Two times a day (BID) | ORAL | Status: DC
  Administered 2023-03-13 – 2023-03-23 (×20): 40 mg via ORAL
  Filled 2023-03-13 (×20): qty 1

## 2023-03-13 MED ORDER — FAMOTIDINE 20 MG PO TABS
20.0000 mg | ORAL_TABLET | Freq: Every day | ORAL | Status: DC
  Administered 2023-03-13 – 2023-03-23 (×11): 20 mg via ORAL
  Filled 2023-03-13 (×11): qty 1

## 2023-03-13 MED ORDER — QUETIAPINE FUMARATE 25 MG PO TABS
25.0000 mg | ORAL_TABLET | Freq: Two times a day (BID) | ORAL | Status: DC
  Administered 2023-03-13 (×2): 25 mg via ORAL
  Filled 2023-03-13 (×2): qty 1

## 2023-03-13 MED ORDER — DICYCLOMINE HCL 10 MG/5ML PO SOLN: 10.0000 mg | Freq: Three times a day (TID) | ORAL | Status: DC | PRN

## 2023-03-13 MED ORDER — POTASSIUM CHLORIDE CRYS ER 20 MEQ PO TBCR
20.0000 meq | EXTENDED_RELEASE_TABLET | Freq: Once | ORAL | Status: AC
  Administered 2023-03-13: 20 meq via ORAL
  Filled 2023-03-13: qty 1

## 2023-03-13 MED ORDER — ALUM & MAG HYDROXIDE-SIMETH 200-200-20 MG/5ML PO SUSP
30.0000 mL | Freq: Four times a day (QID) | ORAL | Status: DC | PRN
  Administered 2023-03-17: 30 mL via ORAL
  Filled 2023-03-13: qty 30

## 2023-03-13 NOTE — Consult Note (Signed)
Consultation Note Date: 03/13/2023   Patient Name: [name]  DOB: 07/23/1930  MRN: [id number]  Age / Sex: 89 y.o., male  PCP: [name], MD Referring Physician: Steffanie Dunn, DO  Reason for Consultation: Establishing goals of care  HPI/Patient Profile: 89 y.o. male   admitted on 03/05/2023 with significant pmhx of polymyalgia rheumatica on chronic steroids, recent hospitalization for aortitis, CHF, chronic leukocytosis, AAA with S/P stent graft in 2018, HLD, and DDD was admitted with an acute PE at [facility] ED and initially complaining of generalized weakness on 4/7. Patient developed SVT on the morning of 4/8 along with respiratory distress and hypoxia requiring non-re breather mask. Patient being managed by [facility], Cardiology, and Infectious Disease. PCCM consulted by ID to assist in managing the acute hypoxic respiratory failure.   Significant  Hospital Events   4/7 Admitted for Acute PE 4/8 Developed Rapid Atrial Flutter-HR 170s-180s , along with respiratory failure requiring NRB, PCCM consulted  4/12 feeling better, HHFNC 60%, 45L, steroids tapered 03/13/23 ongoing delirium, high risk for decompensation   Today is day 7 of this hospitalization  Patient does not have medical decision capacity at this time.  Patient's wife is HPOA  Family face treatment option decisions, advanced directive decisions and anticipatory care needs.      Clinical Assessment and Goals of Care:  This NP Lorinda Creed reviewed medical records, received report from team, assessed the patient and then meet /with the patient's spouse/[name] and son/ [name] to discuss diagnosis, prognosis, GOC, EOL wishes disposition and options.   Concept of Palliative Care was introduced as specialized medical care for people and their families living with serious illness.  If focuses on providing relief from the symptoms and stress of a  serious illness.  The goal is to improve quality of life for both the patient and the family. Values and goals of care important to patient and family were attempted to be elicited.   Created space and opportunity for family to explore thoughts and feelings regarding current medical situation.   Family verbalize their love and appreciation for [name].  He has been married to his wife for 55 years.  [name] has been influential in [employer] and development since the early 70s.  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   However on further exploration they are able to speak to a  notable physical and functional decline over the past year.  They report seeing significant highs and lows in his state of wellness over the past year.   Education offered on the seriousness and complexity of patient's current medical situation.      A  discussion was had today regarding advanced directives.  Concepts specific to code status, artifical feeding and hydration, continued IV antibiotics and rehospitalization was had.    The difference between a aggressive medical intervention path  and a palliative comfort care path for this patient at this time  was had.    Education offered on hospice benefit; philosophy and eligibility.  [name] served on the board in the early years of [facility]  MOST form introduced     Questions and concerns addressed.  Patient  encouraged to call with questions or concerns.     PMT will continue to support holistically.           Patient has advance care planning documents.  I secured a copy for scanning.  Wife is named H POA and there is a Dietitian for natural death  HCPOA    SUMMARY OF RECOMMENDATIONS    Code Status/Advance Care Planning: Full code Educated patient/family to consider DNR/DNI status understanding evidenced based poor outcomes in similar  hospitalized patient, as the cause of arrest is likely associated with advanced chronic illness rather than an easily reversible acute cardio-pulmonary event. PMT will continue to support holistically-will follow-up with family in the morning    Palliative Prophylaxis:  Aspiration, Delirium Protocol, and Frequent Pain Assessment  Additional Recommendations (Limitations, Scope, Preferences): Full Scope Treatment  Psycho-social/Spiritual:  Desire for further Chaplaincy support:no-family report community church support and pastor visits. Additional Recommendations: Education on Hospice  Prognosis:  Unable to determine  Discharge Planning: To Be Determined      Primary Diagnoses: Present on Admission:  Acute pulmonary embolism  Acute hypoxic respiratory failure  Acute hyponatremia  Elevated troponin  Lactic acidosis  HCAP (healthcare-associated pneumonia)  AAA (abdominal aortic aneurysm) without rupture (HCC)  PMR (polymyalgia rheumatica)  Chronic diastolic CHF (congestive heart failure)  Hyperlipidemia  BPH (benign prostatic hyperplasia)  Pulmonary embolism   I have reviewed the medical record, interviewed the patient and family, and examined the patient. The following aspects are pertinent.  Past Medical History:  Diagnosis Date   AAA (abdominal aortic aneurysm)    Abnormal EKG    Left atrial abnormality   Allergic rhinitis    Allergy    Benign neoplasm of colon 04/14/10   3 small polyps on colonoscopy by Dr. Marina Goodell   Carpal tunnel syndrome    Cataract    Degenerative disc disease    Disturbances metabolism of methionine, homocystine, and cystathionine    Elevated homocysteine   Elevated prostate specific antigen (PSA)    Hearing loss    Hyperlipidemia    Impotence of organic origin    Penile implant   Internal hemorrhoids    Neck pain    Otosclerosis of both ears    Peripheral vascular disease    Bilateral femoral bruit   Plantar fasciitis    Polymyalgia  rheumatica    Rotator cuff syndrome of left shoulder    Scoliosis    Shoulder pain    Social History   Socioeconomic History   Marital status: Married    Spouse name: [name]   Number of children: 2   Years of education: Not on file   Highest education level: Not on file  Occupational History   Occupation: Dealer: [employer]  Tobacco Use   Smoking status: Never   Smokeless tobacco: Never  Vaping Use   Vaping Use: Never used  Substance and Sexual Activity   Alcohol use: No    Alcohol/week: 0.0 standard drinks of alcohol   Drug use: No   Sexual activity: Not Currently  Other Topics Concern   Not on file  Social History Narrative   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   Social Determinants of Health   Financial Resource Strain: Low Risk  (03/07/2023)   Overall Financial Resource Strain (CARDIA)    Difficulty of Paying Living Expenses: Not hard at all  Food Insecurity: No Food Insecurity (01/25/2023)   Hunger Vital Sign    Worried About Running Out of Food in the Last Year: Never true    Ran Out of Food in the Last Year: Never true  Transportation Needs: No Transportation Needs (01/25/2023)   PRAPARE - Administrator, Civil Service (Medical): No    Lack of Transportation (Non-Medical): No  Physical Activity: Not on file  Stress: Not on file  Social Connections: Not on file   Family History  Problem Relation Age of Onset   Heart disease Mother    Emphysema Mother    Colon cancer Neg Hx    Esophageal cancer Neg Hx    Rectal cancer Neg Hx    Stomach cancer Neg Hx    Scheduled Meds:  alteplase  2 mg Intracatheter Once   apixaban  10 mg Oral BID   Followed by   Melene Muller ON 03/14/2023] apixaban  5 mg Oral BID   Chlorhexidine Gluconate Cloth  6 each Topical Daily   feeding supplement  237 mL Oral TID BM   fluorometholone  1 drop Right Eye QHS   folic acid  1 mg  Oral Daily   insulin aspart  0-15 Units Subcutaneous TID WC   insulin aspart  0-5 Units Subcutaneous QHS   melatonin  3 mg Oral QHS   methylPREDNISolone (SOLU-MEDROL) injection  40 mg Intravenous Q12H   metoprolol tartrate  12.5 mg Oral BID   pantoprazole  40 mg Oral Daily   QUEtiapine  25 mg Oral QHS   rosuvastatin  5 mg Oral QHS   senna-docusate  1 tablet Oral BID   sodium chloride flush  10-40 mL Intracatheter Q12H   sulfamethoxazole-trimethoprim  1 tablet Oral Once per day on Mon Wed Fri   traZODone  100 mg Oral Once   traZODone  200 mg Oral QHS   Continuous Infusions:  sodium chloride Stopped (03/10/23 1127)   dexmedetomidine (PRECEDEX) IV infusion Stopped (03/13/23 0548)   piperacillin-tazobactam (ZOSYN)  IV 12.5 mL/hr at 03/13/23 0700   vancomycin Stopped (03/13/23 0544)   PRN Meds:.sodium chloride, acetaminophen **OR** acetaminophen, hydrOXYzine, lidocaine, menthol-cetylpyridinium, ondansetron (ZOFRAN) IV, mouth rinse, sodium chloride flush Medications Prior to Admission:  Prior to Admission medications   Medication Sig Start Date End Date Taking? Authorizing Provider  acyclovir (ZOVIRAX) 400 MG tablet ONE TABLET TWICE A DAY DUE TO HERPES INFECTION Patient taking differently: Take 400 mg by mouth 2 (two) times daily. 01/22/18  Yes Sharon Seller, NP  butalbital-acetaminophen-caffeine (FIORICET) 50-325-40 MG tablet Take 1 tablet by mouth daily as needed for headache.   Yes [provider]  cefTRIAXone (ROCEPHIN) IVPB Inject 2 g into the vein daily.   Yes [provider]  cholecalciferol (VITAMIN D3) 25 MCG (1000 UNIT) tablet Take 1,000 Units by mouth daily.   Yes [provider]  daptomycin (CUBICIN) IVPB Inject 600 mg into the vein daily.   Yes [provider]  DOCOSAHEXAENOIC ACID PO Take 1 g by mouth daily.   Yes [provider]  fish oil-omega-3 fatty acids 1000 MG capsule Take 1,000 mg by mouth daily.    Yes [provider]  fluorometholone (FML) 0.1 % ophthalmic suspension Place 1 drop into the right eye at  bedtime.    Yes [provider]  folic acid (FOLVITE) 1 MG tablet Take 1 mg by mouth daily.   Yes [provider]  L-Methylfolate-B12-B6-B2 (METAFOLBIC) 04-28-49-5 MG TABS TAKE ONE TABLET TWICE DAILY Patient taking differently: Take 1 tablet by mouth 2 (two) times daily. 08/18/17  Yes Sharon Seller, NP  Multiple Vitamins-Minerals (MULTIVITAMIN PO) Take 1 tablet by mouth daily.    Yes [provider]  pantoprazole (PROTONIX) 40 MG tablet Take 40 mg by mouth daily.   Yes [provider]  predniSONE (DELTASONE) 5 MG tablet Take 20 mg by mouth daily with breakfast.   Yes [provider]  sulfamethoxazole-trimethoprim (BACTRIM DS) 800-160 MG tablet Take 1 tablet by mouth 3 (three) times a week. Monday, Wednesday, Friday   Yes [provider]  tamsulosin (FLOMAX) 0.4 MG CAPS capsule Take 0.8 mg by mouth at bedtime. 06/10/20  Yes [provider]  Tart Cherry 1200 MG CAPS Take 1 tablet by mouth daily.   Yes [provider]  traZODone (DESYREL) 50 MG tablet Take 25 mg by mouth at bedtime as needed for sleep.   Yes [provider]  rosuvastatin (CRESTOR) 5 MG tablet TAKE ONE TABLET EACH DAY TO REDUCE CHOLESTEROL Patient not taking: Reported on 03/05/2023 09/01/17   Sharon Seller, NP   Allergies  Allergen Reactions   Amoxicillin Other (See Comments)    Per wife: nausea (taken on empty stomach)   Codeine Nausea And Vomiting   Doxycycline Other (See Comments)    Upset stomach, currently taking and tolerating well     Morphine And Related Nausea And Vomiting   Review of Systems  Unable to perform ROS: Mental status change    Physical Exam Cardiovascular:     Rate and Rhythm: Normal rate.  Pulmonary:     Effort: Pulmonary effort is normal.  Skin:    General: Skin is warm and dry.  Neurological:     Mental Status:  He is alert. He is disoriented.  Psychiatric:        Behavior: Behavior is agitated.        Cognition and Memory: Cognition is impaired.     Vital Signs: BP (!) 144/77   Pulse 81   Temp 98.7 F (37.1 C) (Axillary)   Resp (!) 22   Ht 6' (1.829 m)   Wt 70.9 kg   SpO2 94%   BMI 21.20 kg/m  Pain Scale: 0-10   Pain Score: Asleep   SpO2: SpO2: 94 % O2 Device:SpO2: 94 % O2 Flow Rate: .O2 Flow Rate (L/min): 10 L/min  IO: Intake/output summary:  Intake/Output Summary (Last 24 hours) at 03/13/2023 0723 Last data filed at 03/13/2023 0700 Gross per 24 hour  Intake 864.71 ml  Output 1100 ml  Net -235.29 ml    LBM: Last BM Date : 03/09/23 Baseline Weight: Weight: 78.7 kg Most recent weight: Weight: 70.9 kg     Palliative Assessment/Data:  30 %    Time  110 minutes    discussed with Dr. Chestine Spore and bedside RN  Signed by: Lorinda Creed, NP   Please contact Palliative Medicine Team phone at [phone] for questions and concerns.  For individual provider: See Loretha Stapler

## 2023-03-13 NOTE — Progress Notes (Signed)
Subjective:  "Just put me to sleep"  Antibiotics:  Anti-infectives (From admission, onward)    Start     Dose/Rate Route Frequency Ordered Stop   03/13/23 2200  vancomycin (VANCOREADY) IVPB 750 mg/150 mL        750 mg 150 mL/hr over 60 Minutes Intravenous Every 12 hours 03/13/23 1131     03/10/23 2300  vancomycin (VANCOREADY) IVPB 1250 mg/250 mL  Status:  Discontinued        1,250 mg 166.7 mL/hr over 90 Minutes Intravenous Every 18 hours 03/10/23 2243 03/13/23 1131   03/10/23 2000  piperacillin-tazobactam (ZOSYN) IVPB 3.375 g        3.375 g 12.5 mL/hr over 240 Minutes Intravenous Every 8 hours 03/10/23 1140     03/06/23 1715  vancomycin (VANCOREADY) IVPB 1500 mg/300 mL  Status:  Discontinued        1,500 mg 150 mL/hr over 120 Minutes Intravenous  Once 03/06/23 1628 03/06/23 1628   03/06/23 1628  vancomycin (VANCOREADY) IVPB 1250 mg/250 mL  Status:  Discontinued        1,250 mg 166.7 mL/hr over 90 Minutes Intravenous Every 24 hours 03/06/23 1628 03/10/23 2243   03/06/23 0900  sulfamethoxazole-trimethoprim (BACTRIM DS) 800-160 MG per tablet 1 tablet        1 tablet Oral Once per day on Mon Wed Fri 03/05/23 2141     03/06/23 0800  ceFEPIme (MAXIPIME) 2 g in sodium chloride 0.9 % 100 mL IVPB  Status:  Discontinued        2 g 200 mL/hr over 30 Minutes Intravenous Every 12 hours 03/05/23 2140 03/10/23 1140   03/05/23 1845  DAPTOmycin (CUBICIN) 600 mg in sodium chloride 0.9 % IVPB  Status:  Discontinued        600 mg 124 mL/hr over 30 Minutes Intravenous Daily 03/05/23 1833 03/06/23 1546   03/05/23 1645  ceFEPIme (MAXIPIME) 2 g in sodium chloride 0.9 % 100 mL IVPB        2 g 200 mL/hr over 30 Minutes Intravenous  Once 03/05/23 1639 03/05/23 2018   03/05/23 1645  vancomycin (VANCOREADY) IVPB 1500 mg/300 mL  Status:  Discontinued        1,500 mg 150 mL/hr over 120 Minutes Intravenous  Once 03/05/23 1639 03/05/23 1645       Medications: Scheduled Meds:  alteplase  2 mg  Intracatheter Once   apixaban  10 mg Oral BID   Followed by   Melene Muller ON 03/14/2023] apixaban  5 mg Oral BID   Chlorhexidine Gluconate Cloth  6 each Topical Daily   feeding supplement  237 mL Oral TID BM   fluorometholone  1 drop Right Eye QHS   folic acid  1 mg Oral Daily   insulin aspart  0-15 Units Subcutaneous TID WC   insulin aspart  0-5 Units Subcutaneous QHS   melatonin  3 mg Oral QHS   methylPREDNISolone (SOLU-MEDROL) injection  40 mg Intravenous Q12H   metoprolol tartrate  12.5 mg Oral BID   pantoprazole  40 mg Oral Daily   potassium chloride  20 mEq Oral Once   QUEtiapine  25 mg Oral BID   rosuvastatin  5 mg Oral QHS   senna-docusate  1 tablet Oral BID   sodium chloride flush  10-40 mL Intracatheter Q12H   sulfamethoxazole-trimethoprim  1 tablet Oral Once per day on Mon Wed Fri   traZODone  200 mg Oral QHS   Continuous Infusions:  sodium chloride Stopped (03/10/23 1127)   dexmedetomidine (PRECEDEX) IV infusion Stopped (03/13/23 0548)   piperacillin-tazobactam (ZOSYN)  IV Stopped (03/13/23 0911)   vancomycin     PRN Meds:.sodium chloride, acetaminophen **OR** acetaminophen, hydrOXYzine, lidocaine, menthol-cetylpyridinium, ondansetron (ZOFRAN) IV, mouth rinse, sodium chloride flush    Objective: Weight change:   Intake/Output Summary (Last 24 hours) at 03/13/2023 1135 Last data filed at 03/13/2023 1000 Gross per 24 hour  Intake 1049.53 ml  Output 1100 ml  Net -50.47 ml    Blood pressure (!) 146/89, pulse (!) 106, temperature (!) 97.5 F (36.4 C), temperature source Oral, resp. rate (!) 24, height 6' (1.829 m), weight 70.9 kg, SpO2 (!) 89 %. Temp:  [97.5 F (36.4 C)-98.7 F (37.1 C)] 97.5 F (36.4 C) (04/15 0804) Pulse Rate:  [54-154] 106 (04/15 1000) Resp:  [17-41] 24 (04/15 1000) BP: (100-162)/(57-111) 146/89 (04/15 1000) SpO2:  [85 %-100 %] 89 % (04/15 1000) FiO2 (%):  [45 %-52 %] 45 % (04/14 1956) Weight:  [70.9 kg] 70.9 kg (04/15 0400)  Physical  Exam: Physical Exam Eyes:     Extraocular Movements: Extraocular movements intact.     Conjunctiva/sclera: Conjunctivae normal.  Cardiovascular:     Rate and Rhythm: Tachycardia present.  Pulmonary:     Effort: No respiratory distress.     Breath sounds: No stridor. No wheezing.  Abdominal:     General: There is no distension.  Skin:    General: Skin is warm and dry.  Neurological:     General: No focal deficit present.     Mental Status: He is disoriented.  Psychiatric:        Mood and Affect: Mood is anxious and depressed.        Speech: Speech is delayed.        Behavior: Behavior is agitated. Behavior is cooperative.      CBC:    BMET Recent Labs    03/12/23 0630 03/13/23 0348  NA 136 141  K 3.7 3.4*  CL 101 107  CO2 25 26  GLUCOSE 176* 162*  BUN 37* 41*  CREATININE 1.09 1.12  CALCIUM 7.6* 7.7*      Liver Panel  Recent Labs    03/11/23 0419  PROT 5.6*  ALBUMIN 2.5*  AST 23  ALT 39  ALKPHOS 78  BILITOT 0.8  BILIDIR 0.2  IBILI 0.6        Sedimentation Rate No results for input(s): "ESRSEDRATE" in the last 72 hours.  C-Reactive Protein No results for input(s): "CRP" in the last 72 hours.   Micro Results: Recent Results (from the past 720 hour(s))  Culture, blood (routine x 2)     Status: None   Collection Time: 03/05/23  4:14 PM   Specimen: BLOOD  Result Value Ref Range Status   Specimen Description BLOOD SITE NOT SPECIFIED  Final   Special Requests   Final    BOTTLES DRAWN AEROBIC AND ANAEROBIC Blood Culture adequate volume   Culture   Final    NO GROWTH 5 DAYS Performed at [facility], [address], [place], [place] [address]    Report Status 03/10/2023 FINAL  Final  Resp panel by RT-PCR (RSV, Flu A&B, Covid) Anterior Nasal Swab     Status: None   Collection Time: 03/05/23  4:29 PM   Specimen: Anterior Nasal Swab  Result Value Ref Range Status   SARS Coronavirus 2 by RT PCR NEGATIVE NEGATIVE Final   Influenza A by  PCR  NEGATIVE NEGATIVE Final   Influenza B by PCR NEGATIVE NEGATIVE Final    Comment: (NOTE) The Xpert Xpress SARS-CoV-2/FLU/RSV plus assay is intended as an aid in the diagnosis of influenza from Nasopharyngeal swab specimens and should not be used as a sole basis for treatment. Nasal washings and aspirates are unacceptable for Xpert Xpress SARS-CoV-2/FLU/RSV testing.  Fact Sheet for Patients: BloggerCourse.com  Fact Sheet for Healthcare Providers: SeriousBroker.it  This test is not yet approved or cleared by the [place] FDA and has been authorized for detection and/or diagnosis of SARS-CoV-2 by FDA under an Emergency Use Authorization (EUA). This EUA will remain in effect (meaning this test can be used) for the duration of the COVID-19 declaration under Section 564(b)(1) of the Act, 21 U.S.C. section 360bbb-3(b)(1), unless the authorization is terminated or revoked.     Resp Syncytial Virus by PCR NEGATIVE NEGATIVE Final    Comment: (NOTE) Fact Sheet for Patients: BloggerCourse.com  Fact Sheet for Healthcare Providers: SeriousBroker.it  This test is not yet approved or cleared by the [place] FDA and has been authorized for detection and/or diagnosis of SARS-CoV-2 by FDA under an Emergency Use Authorization (EUA). This EUA will remain in effect (meaning this test can be used) for the duration of the COVID-19 declaration under Section 564(b)(1) of the Act, 21 U.S.C. section 360bbb-3(b)(1), unless the authorization is terminated or revoked.  Performed at [facility], [address], [place], [place] [address]   Culture, blood (routine x 2)     Status: None   Collection Time: 03/05/23  4:33 PM   Specimen: BLOOD LEFT FOREARM  Result Value Ref Range Status   Specimen Description BLOOD LEFT FOREARM  Final   Special Requests   Final    BOTTLES DRAWN AEROBIC  AND ANAEROBIC Blood Culture results may not be optimal due to an excessive volume of blood received in culture bottles   Culture   Final    NO GROWTH 5 DAYS Performed at [facility], [address], [place], [place] [address]    Report Status 03/10/2023 FINAL  Final  MRSA Next Gen by PCR, Nasal     Status: None   Collection Time: 03/06/23  8:34 PM   Specimen: Nasal Mucosa; Nasal Swab  Result Value Ref Range Status   MRSA by PCR Next Gen NOT DETECTED NOT DETECTED Final    Comment: (NOTE) The GeneXpert MRSA Assay (FDA approved for NASAL specimens only), is one component of a comprehensive MRSA colonization surveillance program. It is not intended to diagnose MRSA infection nor to guide or monitor treatment for MRSA infections. Test performance is not FDA approved in patients less than 79 years old. Performed at [facility], [address], [place], [place] [address]     Studies/Results: [place] CHEST PORT 1 VIEW  Result Date: 03/12/2023 CLINICAL DATA:  Acute hypercapnic respiratory failure. EXAM: PORTABLE CHEST 1 VIEW COMPARISON:  Chest x-ray dated 03/10/2023. FINDINGS: Diffuse opacities bilaterally, predominantly interstitial, not significantly changed compared to most recent chest x-ray of 03/10/2023. No pleural effusion or pneumothorax is seen. Heart size and mediastinal contours appear stable. RIGHT-sided PICC line is well positioned with tip at the level of the RIGHT atrium. IMPRESSION: Diffuse opacities bilaterally, predominantly interstitial, not significantly changed compared to most recent chest x-ray of 03/10/2023, compatible with multifocal pneumonia and/or pulmonary edema. Electronically Signed   By: Bary Richard M.D.   On: 03/12/2023 12:04      Assessment/Plan:  INTERVAL  HISTORY: he is doing better from oxygenation standpoint but is fairly delirious  Principal Problem:   Acute pulmonary embolism Active Problems:   Hyperlipidemia   PMR (polymyalgia  rheumatica)   AAA (abdominal aortic aneurysm) without rupture (HCC)   Generalized weakness   Acute hypoxic respiratory failure   Acute hyponatremia   Elevated troponin   Lactic acidosis   HCAP (healthcare-associated pneumonia)   Chronic diastolic CHF (congestive heart failure)   BPH (benign prostatic hyperplasia)   Pulmonary embolism   Eosinophilic pneumonia   Aortitis   Atrial flutter   FUO (fever of unknown origin)   S/P insertion of endovascular thoracic aortic stent graft   Drug-induced insomnia   Anxiety   DVT, lower extremity, distal, acute, right   PAF (paroxysmal atrial fibrillation)    [name] is a 89 y.o. male with complicated medical history including aortic aneurysm status post placement of stent graft, polymyalgia rheumatica that has been well-controlled on prednisone who has been experiencing malaise for nearly 8 months with intermittent fevers and elevated inflammatory markers, who was worked up for this condition by infectious disease at [facility] including PET scan that showed increased uptake in the aorta near stent graft with question of aortitis.  The tagged white cell scan subsequently did not show uptake in this area or anywhere else in the body.  I reviewed the case with Dr.Julia Dolores Frame yesterday over the phone and she stated that they had reviewed the PET scan again with radiology and that radiology is not entirely convinced of the uptake being particularly dramatic.  Regardless the patient was the begun on empiric antibiotic regimen for possible bacterial infectious aortitis with daptomycin and ceftriaxone.  The plan was to see how the patient did on this including monitoring his inflammatory markers.  All along however there has been suspicion that there might be an autoimmune process involved and the patient is followed closely by Dr. Alberteen Spindle with nephrology at [facility].  He has now been admitted to [facility] with an acute pulmonary  embolism and a CT angiogram that also shows multifocal infiltrates.  He is experienced progressive hypoxemic respiratory failure requiring high flow oxygen currently on high flow oxygen via nasal cannula.  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  His antibiotics switched to vancomycin and cefepime having been moved from daptomycin ceftriaxone disease.  CCM now gotten rid of cefpime in favor of zosyn due to concerns for CNS side effects of cefepime/  He improved from an oxygenation stand point over weekend on lower dose steroids with diuresis and he is with less anxiety though his delirium is still an issue   #1  Pulmonary embolism with heart strain and DVT identified in the right lower extremity  On anticoagulation  #2 Pneumonia: favor eosinophilic PNA vs perhaps more likely declaration of autoimmune pulmonary pathology  His steroids have been dropped but he has actually improved from O2 standpoint after diuresis  We will have a low threshold to switch the vanco to doxy   #3 ? Aortitis: his original stop date for his empiric antibiotics was 425 and we will likely hold with that but looking more likely he will complete antibiotics in the hospitalt was responsible for his elevated inflammatory markers  #4  Suspected underlying autoimmune disease potentially with pulmonary complications and patient with known polymyalgia rheumatica:  He is on  corticosteroids and will be interesting to see if he improves globally if he makes it out of the ICU and onto the floor and ultimately back home.    #5 Delirium: he is less anxious in certain ways vs when I saw him last but he is more delirious than when I saw him last. Hopefully this multi-factorial issue will improve  I have personally spent 52 minutes involved in face-to-face and  non-face-to-face activities for this patient on the day of the visit. Professional time spent includes the following activities: Preparing to see the patient (review of tests), Obtaining and/or reviewing separately obtained history (admission/discharge record), Performing a medically appropriate examination and/or evaluation , Ordering medications/tests/procedures, referring and communicating with other health care professionals, Documenting clinical information in the EMR, Independently interpreting results (not separately reported), Communicating results to the patient/family/caregiver, Counseling and educating the patient/family/caregiver and Care coordination (not separately reported).      LOS: 7 days   Acey Lav 03/13/2023, 11:35 AM

## 2023-03-13 NOTE — TOC Progression Note (Addendum)
Transition of Care [facility]) - Progression Note    Patient Details  Name: [name] MRN: [id number] Date of Birth: 11/11/1930  Transition of Care [facility]) CM/SW Contact  Graves-Bigelow, Lamar Laundry, RN Phone Number: 03/13/2023, 12:34 PM  Clinical Narrative: Patient was discussed in progression rounds this morning. Staff RN states patient with confusion and agitation.-spouse at the bedside. Patient is on Precedex; continues on IV Solumedrol and IV Vancomycin. Case Manager will continue to follow for transition of care needs.   Expected Discharge Plan: Home w Home Health Services Barriers to Discharge: Continued Medical Work up  Expected Discharge Plan and Services   Discharge Planning Services: CM Consult Post Acute Care Choice: Home Health Living arrangements for the past 2 months: Single Family Home   Social Determinants of Health (SDOH) Interventions SDOH Screenings   Food Insecurity: No Food Insecurity (01/25/2023)  Housing: Low Risk  (01/25/2023)  Transportation Needs: No Transportation Needs (01/25/2023)  Utilities: Not At Risk (01/25/2023)  Financial Resource Strain: Low Risk  (03/07/2023)  Tobacco Use: Low Risk  (03/05/2023)    Readmission Risk Interventions     No data to display

## 2023-03-13 NOTE — Progress Notes (Addendum)
NAME:  [name], MRN:  [id number], DOB:  1930-04-18, LOS: 7 ADMISSION DATE:  03/05/2023, CONSULTATION DATE: 4/8  REFERRING MD:  Daiva Eves CHIEF COMPLAINT:  Generalized Weakness   History of Present Illness:  89 yr old male with significant pmhx of polymyalgia rheumatica on chronic steroids, recent hospitalization for aortitis, CHF, chronic leukocytosis, AAA with S/P stent graft in 2018, HLD, and DDD was admitted with an acute PE at [facility] ED and initially complaining of generalized weakness on 4/7. Patient developed SVT on the morning of 4/8 along with respiratory distress and hypoxia requiring non-re breather mask. Patient being managed by [facility], Cardiology, and Infectious Disease. PCCM consulted by ID to assist in managing the acute hypoxic respiratory failure.   Pertinent  Medical History   Past Medical History:  Diagnosis Date   AAA (abdominal aortic aneurysm)    Abnormal EKG    Left atrial abnormality   Allergic rhinitis    Allergy    Benign neoplasm of colon 04/14/10   3 small polyps on colonoscopy by Dr. Marina Goodell   Carpal tunnel syndrome    Cataract    Degenerative disc disease    Disturbances metabolism of methionine, homocystine, and cystathionine    Elevated homocysteine   Elevated prostate specific antigen (PSA)    Hearing loss    Hyperlipidemia    Impotence of organic origin    Penile implant   Internal hemorrhoids    Neck pain    Otosclerosis of both ears    Peripheral vascular disease    Bilateral femoral bruit   Plantar fasciitis    Polymyalgia rheumatica    Rotator cuff syndrome of left shoulder    Scoliosis    Shoulder pain      Significant Hospital Events: Including procedures, antibiotic start and stop dates in addition to other pertinent events   4/7 Admitted for Acute PE 4/8 Developed Rapid Atrial Flutter-HR 170s-180s , along with respiratory failure requiring NRB, PCCM consulted  4/12 feeling better, HHFNC 60%, 45L, steroids tapered  Interim History /  Subjective:  Down to salter HFNC 10L Ongoing delirium/ anxiety but did get some sleep overnight without extra dose of trazadone, but remained on precedex> turned off prior to shift change.  Afebrile   Objective   Blood pressure 139/79, pulse 96, temperature (!) 97.5 F (36.4 C), temperature source Oral, resp. rate (!) 26, height 6' (1.829 m), weight 70.9 kg, SpO2 91 %.    FiO2 (%):  [45 %-52 %] 45 %   Intake/Output Summary (Last 24 hours) at 03/13/2023 0950 Last data filed at 03/13/2023 0800 Gross per 24 hour  Intake 828.27 ml  Output 1100 ml  Net -271.73 ml   Filed Weights   03/08/23 0600 03/11/23 0500 03/13/23 0400  Weight: 79.8 kg 77.1 kg 70.9 kg   Examination: General:  AoC ill appearing elderly male lying in bed, anxious, asking to "just put me to sleep" HEENT: MM pink/dry, pupils 3/reactive Neuro: Awake, oriented to person, place, year, still intermittently confused, MAE CV: rr, NSR, no murmur PULM:  non labored, tachypneic 20's but comfortable appearing, clear anteriorly, faint basilar rales on R GI: soft, bs+, ND, voids Extremities: warm/dry, no LE edema  Skin: no rashes  UOP 1.1L / 24hrs  Net - 6.6 L  Afebrile  Resolved Hospital Problem list   Hyponatremia  Assessment & Plan:  Acute Hypoxic Respiratory Failure in setting possibly of multifocal pna vs eosinophilic pna/ ILD/ autoimmune process   Acute PE with  RLE DVT PMR on chronic prednisone, immunocompromised  Received 3 days of high dose steroids, completed 4/10 Daptomycin stopped 4/7 - sed rate down 138> 109 on 4/12 - doing well on salter HFNC at 10L.  Continue to monitor closely, hopefully will not need HHFNC back.  His anxiety/ delirium exacerbates his hypoxia at times.  Given risk of decompensation, continue ICU care for now - goal sat > 88% - continue methylprednisolone 40 mg twice daily, will discuss with attending on next taper down.  Will need a slow taper. Last tapered 4/11 - cont apixaban - abx as  below, ID following.  PCT trending down 4/12 - cont to hold diuretics> not needed.  Monitor volume status closely, currently net -6.6L - intermittent CXR - appreciate PMT consult to assist with GOC - cont PT/ OT, pulmonary hygiene, IS as able  - Fungitell and HSP, Neg.  RA noted at 26 otherwise autoimmune panel neg.   - no apparent issues with swallowing, continue to monitor for signs of aspiration   Acute hyperactive delirium Anxiety - multifactorial with LOS, hypoxia, steroids, infection, age.  Cefepime changed to zosyn to avoid cefepime induced encephalopathy  - continue precedex prn - Qtc reassuring.  Start seroquel 25mg  BID - trazadone increased 200mg  q HS to help with sleep with prn melatonin - prn hydroxyzine - avoid benzo's - delirium precautions - supportive care as above   Paroxysmal atrial flutter/atrial fibrillation - recurrent afib with RVR this am off precedex with high anxiety ongoing delirium - given lopressor 2.5mg  x 1 as did not improve with restarting precedex, saturations were ok  - cont metoprolol 12.5mg  BID.  Some borderline SBP as low as 100.  Hold off on increasing for now.  Intermittent precedex also affecting hemodynamics.   - cont apixaban - optimize electrolytes, K and Mag - EF normal EF with no wall motion abnormalities and diastolic parameters are normal  Hyperlipidemia - Cont Crestor  BPH - Cont tamsulosin  AAA s/p aortic graft Possible aortitis - ABX per ID.  Dapto stopped 4/7 for concern of eosinophilic PNA.  [facility] ID - Currently on vancomycin and Zosyn per ID  Prediabetes, steroid induced hyperglycemia  Patient hemoglobin A1c 6.3 - cont SSI prn, goal 140-180  At risk for malnutrition Deconditioning - maximize nutrition as able, ensure BID - PT/ OT  Best Practice (right click and "Reselect all SmartList Selections" daily)   Diet/type: Regular consistency DVT prophylaxis: Apixaban GI prophylaxis: PPI Lines:  N/A Foley:  N/A Code Status:  full code Last date of multidisciplinary goals of care discussion [4/13: Patient, his wife and son  updated at beside, decision was to continue full scope of care].  He would like to go for ventilator if needed as last resort for short-term only.  PMT consulted 4/15.     This patient is critically ill with multiple organ system failure which requires frequent high complexity decision making, assessment, support, evaluation, and titration of therapies. This was completed through the application of advanced monitoring technologies and extensive interpretation of multiple databases.  During this encounter critical care time was devoted to patient care services described in this note for 32 minutes.     Posey Boyer, MSN, AG-ACNP-BC [facility] 03/13/2023, 9:51 AM  See Amion for pager If no response to pager, please call PCCM consult pager After 7:00 pm call Elink

## 2023-03-13 NOTE — Progress Notes (Signed)
ELINK contacted per wife request to update patients code status.

## 2023-03-13 NOTE — Progress Notes (Signed)
[facility] ADULT ICU REPLACEMENT PROTOCOL   The patient does apply for the [facility] Adult ICU Electrolyte Replacment Protocol based on the criteria listed below:   1.Exclusion criteria: TCTS, ECMO, Dialysis, and Myasthenia Gravis patients 2. Is GFR >/= 30 ml/min? Yes.    Patient's GFR today is >60 3. Is SCr </= 2? Yes.   Patient's SCr is 1.12 mg/dL 4. Did SCr increase >/= 0.5 in 24 hours? No. 5.Pt's weight >40kg  Yes.   6. Abnormal electrolyte(s): K  7. Electrolytes replaced per protocol 8.  Call MD STAT for K+ </= 2.5, Phos </= 1, or Mag </= 1 Physician:  Merryl Hacker Demetria Lightsey 03/13/2023 4:31 AM

## 2023-03-13 NOTE — Progress Notes (Signed)
PT Cancellation Note  Patient Details Name: [name] MRN: [id number] DOB: 21-Nov-1930   Cancelled Treatment:    Reason Eval/Treat Not Completed: Patient declined, no reason specified (pt refused stating he was tired and wanted to sleep. Pt educated for role of therapy and need to mobilize to regain function. Pt stated he did not want to get up at all today but would like therapy to continue to attempt)   [name] 03/13/2023, 11:22 AM Merryl Hacker, PT Acute Rehabilitation Services Office: [phone]

## 2023-03-13 NOTE — Progress Notes (Signed)
eLink Physician-Brief Progress Note Patient Name: [name] DOB: Apr 15, 1930 MRN: [id number]   Date of Service  03/13/2023  HPI/Events of Note  Per conversation with Mrs. Scheryl Marten her husband communicated earlier today he does not want to be intubated or resuscitated, she requested a DNR / DNI order in accordance with his wishes.  eICU Interventions  DNR / DNI order placed in the chart.        Migdalia Dk 03/13/2023, 9:33 PM

## 2023-03-13 NOTE — Progress Notes (Signed)
Pharmacy Antibiotic Note  [name] is a 89 y.o. male admitted on 03/05/2023 with infectious vs inflammatory aortitis and pneumonia, ID following. Pharmacy has been consulted for vancomycin and Zosyn dosing.  A Vancomycin trough/peak around the AM dose resulted as 11/33 mcg/ml respectively for a calculated AUC of 515 which is therapeutic. True trough is <10 mcg/ml at estimated ~9 so will adjust the regimen slightly to target a lower AUC and keep VT>10 mcg/ml  Original antibiotic end date for concern for infectious aortitis from [facility] was 03/23/23. Will continue antibiotics for now with plan to complete this treatment course.   SCr with slight trend up to 1.12 << 1.09 << 1.03 - will keep a close eye on this trend.   Plan - Adjust Vancomycin to 750 mg IV every 12 hours (eAUC 463, VT 11) - Continue Zosyn 3.375g IV every 8 hours - Remains on Bactrim for PJP prophylaxis with concurrent steroids - Will monitor trends in SCr closely to ensure further dose adjustments are not warranted.   Height: 6' (182.9 cm) Weight: 70.9 kg (156 lb 4.9 oz) IBW/kg (Calculated) : 77.6  Temp (24hrs), Avg:98.1 F (36.7 C), Min:97.5 F (36.4 C), Max:98.7 F (37.1 C)  Recent Labs  Lab 03/06/23 0820 03/06/23 1057 03/07/23 0240 03/09/23 0143 03/10/23 0324 03/10/23 1949 03/11/23 0419 03/12/23 0630 03/13/23 0348  WBC  --   --    < > 27.8* 31.5*  --  22.6* 26.4* 22.0*  CREATININE  --   --    < > 0.83 1.01  --  1.03 1.09 1.12  LATICACIDVEN 2.4* 1.6  --   --   --   --   --   --   --   VANCOPEAK  --   --   --   --   --  5*  --   --  11*   < > = values in this interval not displayed.     Estimated Creatinine Clearance: 44 mL/min (by C-G formula based on SCr of 1.12 mg/dL).    Allergies  Allergen Reactions   Amoxicillin Other (See Comments)    Per wife: nausea (taken on empty stomach)   Codeine Nausea And Vomiting   Doxycycline Other (See Comments)    Upset stomach, currently taking and tolerating  well     Morphine And Related Nausea And Vomiting   Antimicrobials this admission: cefepime 4/7 >> 4/12 daptomycin 4/7 x1 Bactrim prophylaxis 4/8 >>  Vancomycin 4/8 >>  Zosyn 4/12 >>   Microbiology results: 4/7 BCx: NGTD 4/7 RVP: neg 4/8 MRSA PCR: neg  Thank you for allowing pharmacy to be a part of this patient's care.  Georgina Pillion, PharmD, BCPS Infectious Diseases Clinical Pharmacist 03/13/2023 11:32 AM   **Pharmacist phone directory can now be found on [email or web address] ([email or web address]).  Listed under [facility].

## 2023-03-14 DIAGNOSIS — I824Z1 Acute embolism and thrombosis of unspecified deep veins of right distal lower extremity: Secondary | ICD-10-CM | POA: Diagnosis not present

## 2023-03-14 DIAGNOSIS — I776 Arteritis, unspecified: Secondary | ICD-10-CM | POA: Diagnosis not present

## 2023-03-14 DIAGNOSIS — J9601 Acute respiratory failure with hypoxia: Secondary | ICD-10-CM | POA: Diagnosis not present

## 2023-03-14 DIAGNOSIS — F419 Anxiety disorder, unspecified: Secondary | ICD-10-CM | POA: Diagnosis not present

## 2023-03-14 DIAGNOSIS — Z7189 Other specified counseling: Secondary | ICD-10-CM | POA: Diagnosis not present

## 2023-03-14 DIAGNOSIS — R410 Disorientation, unspecified: Secondary | ICD-10-CM

## 2023-03-14 DIAGNOSIS — I2699 Other pulmonary embolism without acute cor pulmonale: Secondary | ICD-10-CM | POA: Diagnosis not present

## 2023-03-14 DIAGNOSIS — J8282 Acute eosinophilic pneumonia: Secondary | ICD-10-CM

## 2023-03-14 DIAGNOSIS — G9341 Metabolic encephalopathy: Secondary | ICD-10-CM | POA: Diagnosis not present

## 2023-03-14 DIAGNOSIS — I2609 Other pulmonary embolism with acute cor pulmonale: Secondary | ICD-10-CM | POA: Diagnosis not present

## 2023-03-14 LAB — MAGNESIUM: Magnesium: 2.1 mg/dL (ref 1.7–2.4)

## 2023-03-14 LAB — RENAL FUNCTION PANEL
Albumin: 2.2 g/dL — ABNORMAL LOW (ref 3.5–5.0)
Anion gap: 10 (ref 5–15)
BUN: 36 mg/dL — ABNORMAL HIGH (ref 8–23)
CO2: 24 mmol/L (ref 22–32)
Calcium: 7.9 mg/dL — ABNORMAL LOW (ref 8.9–10.3)
Chloride: 105 mmol/L (ref 98–111)
Creatinine, Ser: 0.95 mg/dL (ref 0.61–1.24)
GFR, Estimated: >60 mL/min (ref >60)
Glucose, Bld: 129 mg/dL — ABNORMAL HIGH (ref 70–99)
Phosphorus: 3.3 mg/dL (ref 2.5–4.6)
Potassium: 4 mmol/L (ref 3.5–5.1)
Sodium: 139 mmol/L (ref 135–145)

## 2023-03-14 LAB — GLUCOSE, CAPILLARY
Glucose-Capillary: 138 mg/dL — ABNORMAL HIGH (ref 70–99)
Glucose-Capillary: 143 mg/dL — ABNORMAL HIGH (ref 70–99)
Glucose-Capillary: 175 mg/dL — ABNORMAL HIGH (ref 70–99)
Glucose-Capillary: 112 mg/dL — ABNORMAL HIGH (ref 70–99)

## 2023-03-14 MED ORDER — QUETIAPINE FUMARATE 25 MG PO TABS
25.0000 mg | ORAL_TABLET | Freq: Every day | ORAL | Status: DC
  Administered 2023-03-14 – 2023-03-15 (×2): 25 mg via ORAL
  Filled 2023-03-14 (×2): qty 1

## 2023-03-14 MED ORDER — CLONAZEPAM 0.25 MG PO TBDP
0.2500 mg | ORAL_TABLET | Freq: Once | ORAL | Status: AC
  Administered 2023-03-14: 0.25 mg via ORAL
  Filled 2023-03-14: qty 1

## 2023-03-14 MED ORDER — QUETIAPINE FUMARATE 50 MG PO TABS
50.0000 mg | ORAL_TABLET | Freq: Every day | ORAL | Status: DC
  Administered 2023-03-14 – 2023-03-22 (×9): 50 mg via ORAL
  Filled 2023-03-14 (×9): qty 1

## 2023-03-14 MED ORDER — HYDROXYZINE HCL 25 MG PO TABS
25.0000 mg | ORAL_TABLET | Freq: Four times a day (QID) | ORAL | Status: DC
  Administered 2023-03-14 – 2023-03-19 (×17): 25 mg via ORAL
  Filled 2023-03-14 (×19): qty 1

## 2023-03-14 NOTE — Progress Notes (Signed)
Physical Therapy Treatment Patient Details Name: [name] MRN: [id number] DOB: Dec 03, 1929 Today's Date: 03/14/2023   History of Present Illness 89 yr old male  was admitted with an acute PE at [facility] ED and initially complaining of generalized weakness on 4/7. Patient developed SVT on the morning of 4/8 along with respiratory distress and hypoxia requiring non-re breather mask. WGN:[id number] rheumatica on chronic steroids, recent hospitalization for aortitis, CHF, chronic leukocytosis, AAA with S/P stent graft in 2018, HLD, and DDD    PT Comments    Pt with flat affect, decreased processing and interaction. Pt very concerned with getting a nap but willing to attempt mobility. Pt did get to EOB, stand and return to bed but stated fatigue and nausea limiting function, no emesis. Desaturation to 82% on 10L requiring 15L and grossly 4 min to recover. Family encouraged to have pt sit up in chair position and to attempt OOB to recliner again today with staff. Positioned bed toward window per family request. Updated home equipment to reflect pt current needs as family stating plan is to remain with home as the D/C destination. Will continue to follow.     Recommendations for follow up therapy are one component of a multi-disciplinary discharge planning process, led by the attending physician.  Recommendations may be updated based on patient status, additional functional criteria and insurance authorization.  Follow Up Recommendations       Assistance Recommended at Discharge Frequent or constant Supervision/Assistance  Patient can return home with the following Assistance with cooking/housework;Direct supervision/assist for medications management;Direct supervision/assist for financial management;Assist for transportation;Help with stairs or ramp for entrance;A lot of help with walking and/or transfers;A lot of help with bathing/dressing/bathroom   Equipment Recommendations  Wheelchair cushion  (measurements PT);Wheelchair (measurements PT);Hospital bed;BSC/3in1    Recommendations for Other Services       Precautions / Restrictions Precautions Precautions: Fall Precaution Comments: watch sats     Mobility  Bed Mobility Overal bed mobility: Needs Assistance Bed Mobility: Supine to Sit, Sit to Supine     Supine to sit: HOB elevated, Min assist     General bed mobility comments: HOB 20 degrees to pivot to EOB, return to bed min guard for lines, min assist to scoot to [facility]    Transfers Overall transfer level: Needs assistance   Transfers: Sit to/from Stand Sit to Stand: Min assist           General transfer comment: min assist to rise from EOB pt able to side step toward [facility] but denied OOB to chair stating nausea and fatigue needing to return to bed despite education. Pt with desaturation to 82% with increase to 15L for recovery grossly 4 min recovery with elevated HOB to return to 93% prior to weaning O2 back to 11L    Ambulation/Gait                   Stairs             Wheelchair Mobility    Modified Rankin (Stroke Patients Only)       Balance Overall balance assessment: Needs assistance   Sitting balance-Leahy Scale: Poor Sitting balance - Comments: EOB with tendency for right lean, very flexed cervical spine   Standing balance support: During functional activity, Bilateral upper extremity supported Standing balance-Leahy Scale: Poor Standing balance comment: bil UE support in standing  Cognition Arousal/Alertness: Awake/alert Behavior During Therapy: Flat affect Overall Cognitive Status: Impaired/Different from baseline Area of Impairment: Safety/judgement, Problem solving                         Safety/Judgement: Decreased awareness of deficits   Problem Solving: Slow processing          Exercises      General Comments        Pertinent Vitals/Pain Pain  Assessment Faces Pain Scale: Hurts whole lot Pain Location: chest with movement, reports leg pain as well in standing Pain Intervention(s): Limited activity within patient's tolerance, Monitored during session, Repositioned    Home Living                          Prior Function            PT Goals (current goals can now be found in the care plan section) Progress towards PT goals: Progressing toward goals (limited)    Frequency    Min 1X/week      PT Plan Current plan remains appropriate    Co-evaluation              AM-PAC PT "6 Clicks" Mobility   Outcome Measure  Help needed turning from your back to your side while in a flat bed without using bedrails?: A Little Help needed moving from lying on your back to sitting on the side of a flat bed without using bedrails?: A Little Help needed moving to and from a bed to a chair (including a wheelchair)?: A Little Help needed standing up from a chair using your arms (e.g., wheelchair or bedside chair)?: A Little Help needed to walk in hospital room?: Total Help needed climbing 3-5 steps with a railing? : Total 6 Click Score: 14    End of Session Equipment Utilized During Treatment: Gait belt;Oxygen Activity Tolerance: Patient limited by fatigue Patient left: in bed;with call bell/phone within reach;with family/visitor present Nurse Communication: Mobility status PT Visit Diagnosis: Difficulty in walking, not elsewhere classified (R26.2);Other abnormalities of gait and mobility (R26.89);Unsteadiness on feet (R26.81)     Time: 4098-1191 PT Time Calculation (min) (ACUTE ONLY): 32 min  Charges:  $Therapeutic Activity: 23-37 mins                     [name], PT Acute Rehabilitation Services Office: [phone]    [name] 03/14/2023, 12:29 PM

## 2023-03-14 NOTE — Progress Notes (Signed)
Patient ID: [name], male   DOB: [birth year], 89 y.o.   MRN: [id number]    Progress Note from the Palliative Medicine Team at [facility]   Patient Name: [name]        Date: 03/14/2023 DOB: 11-16-1930  Age: 89 y.o. MRN#: [id number] Attending Physician: Steffanie Dunn, DO Primary Care Physician: [name], MD Admit Date: 03/05/2023   Medical records reviewed   89 y.o. male   admitted on 03/05/2023 with significant pmhx of polymyalgia rheumatica on chronic steroids, recent hospitalization for aortitis, CHF, chronic leukocytosis, AAA with S/P stent graft in 2018, HLD, and DDD was admitted with an acute PE at [facility] ED and initially complaining of generalized weakness on 4/7. Patient developed SVT on the morning of 4/8 along with respiratory distress and hypoxia requiring non-re breather mask. Patient being managed by [facility], Cardiology, and Infectious Disease. PCCM consulted by ID to assist in managing the acute hypoxic respiratory failure.    Significant  Hospital Events    4/7 Admitted for Acute PE 4/8 Developed Rapid Atrial Flutter-HR 170s-180s , along with respiratory failure requiring NRB, PCCM consulted  4/12 feeling better, HHFNC 60%, 45L, steroids tapered 03/13/23 ongoing delirium, high risk for decompensation  Detailed review of medical records, discussed with treatment team    Patient does not have medical decision capacity at this time.  Patient's wife is HPOA   Family face treatment option decisions, advanced directive decisions and anticipatory care needs.     This NP assessed patient at the bedside as a follow up to  yesterday's GOCs meeting,  son is at the bedside.  Son requests  SED rate, informed Dr Chestine Spore.   Plan to re-meet when wife arrives for medical update, assess for palliative medicine needs and emotional support.  Attempted to touch base with wife several times throughout the day, without success.   Discussed with nursing to let family know I will reengage  tomorrow and to call with any questions or concerns.   Time:  25  minutes  Detailed review of medical records, medically appropriate exam (MS, labs   discussed with treatment team, counseling and education to patient, family, staff, documenting clinical information, medication management, coordination of care    Lorinda Creed NP  Palliative Medicine Team Team Phone # [phone] Pager [phone]

## 2023-03-14 NOTE — Progress Notes (Signed)
Occupational Therapy Treatment Patient Details Name: [name] MRN: [id number] DOB: 06-12-1930 Today's Date: 03/14/2023   History of present illness 89 yr old male  was admitted with an acute PE at [facility] ED and initially complaining of generalized weakness on 4/7. Patient developed SVT on the morning of 4/8 along with respiratory distress and hypoxia requiring non-re breather mask. WUJ:[id number] rheumatica on chronic steroids, recent hospitalization for aortitis, CHF, chronic leukocytosis, AAA with S/P stent graft in 2018, HLD, and DDD   OT comments  Patient actually performing a little worse than upon eval.  Patient presenting with mental status changes affecting ability to follow commands and functional status.  Patient needing more assist with basic transfers, and ADL completion at the side of the bed.  Family remains insistent on taking him home, so HH based services will remain the recommendation, but if he continues to decline, he would not meet the requirement of intermittent care, and at that point a higher level of rehab will be recommended.  OT will continue efforts in the acute setting to address deficits.     Recommendations for follow up therapy are one component of a multi-disciplinary discharge planning process, led by the attending physician.  Recommendations may be updated based on patient status, additional functional criteria and insurance authorization.    Assistance Recommended at Discharge Frequent or constant Supervision/Assistance  Patient can return home with the following  Help with stairs or ramp for entrance;A little help with bathing/dressing/bathroom;Assist for transportation;Assistance with cooking/housework;Direct supervision/assist for financial management;Direct supervision/assist for medications management;A lot of help with bathing/dressing/bathroom   Equipment Recommendations  BSC/3in1    Recommendations for Other Services      Precautions /  Restrictions Precautions Precautions: Fall Precaution Comments: watch sats and BP Restrictions Weight Bearing Restrictions: No       Mobility Bed Mobility Overal bed mobility: Needs Assistance Bed Mobility: Supine to Sit, Sit to Supine     Supine to sit: Min assist Sit to supine: Min assist        Transfers Overall transfer level: Needs assistance   Transfers: Sit to/from Stand Sit to Stand: Min assist                 Balance Overall balance assessment: Needs assistance Sitting-balance support: Feet supported, No upper extremity supported Sitting balance-Leahy Scale: Fair     Standing balance support: Bilateral upper extremity supported Standing balance-Leahy Scale: Poor                             ADL either performed or assessed with clinical judgement   ADL       Grooming: Wash/dry hands;Wash/dry face;Bed level;Minimal assistance               Lower Body Dressing: Moderate assistance;Bed level   Toilet Transfer: Moderate assistance;Stand-pivot;BSC/3in1                  Extremity/Trunk Assessment Upper Extremity Assessment Upper Extremity Assessment: Overall WFL for tasks assessed   Lower Extremity Assessment Lower Extremity Assessment: Defer to PT evaluation   Cervical / Trunk Assessment Cervical / Trunk Assessment: Normal                      Cognition Arousal/Alertness: Awake/alert Behavior During Therapy: Agitated, Restless Overall Cognitive Status: Impaired/Different from baseline  Problem Solving: Slow processing          Exercises      Shoulder Instructions       General Comments  Patient with HR to 115 with movement and elevated BP    Pertinent Vitals/ Pain       Pain Assessment Faces Pain Scale: Hurts even more Pain Location: chest with movement, L arm Pain Intervention(s): Monitored during session                                                           Frequency  Min 1X/week        Progress Toward Goals  OT Goals(current goals can now be found in the care plan section)  Progress towards OT goals: Progressing toward goals  Acute Rehab OT Goals OT Goal Formulation: With patient Time For Goal Achievement: 03/21/23 Potential to Achieve Goals: Good ADL Goals Pt Will Perform Lower Body Dressing: with min assist;sit to/from stand Pt Will Transfer to Toilet: with min guard assist;stand pivot transfer;bedside commode  Plan Discharge plan needs to be updated    Co-evaluation                 AM-PAC OT "6 Clicks" Daily Activity     Outcome Measure   Help from another person eating meals?: None Help from another person taking care of personal grooming?: A Little Help from another person toileting, which includes using toliet, bedpan, or urinal?: A Lot Help from another person bathing (including washing, rinsing, drying)?: A Lot Help from another person to put on and taking off regular upper body clothing?: A Lot Help from another person to put on and taking off regular lower body clothing?: A Lot 6 Click Score: 15    End of Session Equipment Utilized During Treatment: Oxygen;Gait belt  OT Visit Diagnosis: Unsteadiness on feet (R26.81);Muscle weakness (generalized) (M62.81)   Activity Tolerance Patient limited by lethargy;Patient limited by pain   Patient Left in bed;with call bell/phone within reach;with family/visitor present;with nursing/sitter in room   Nurse Communication Mobility status        Time: 1418-1440 OT Time Calculation (min): 22 min  Charges: OT General Charges $OT Visit: 1 Visit OT Treatments $Self Care/Home Management : 8-22 mins  03/14/2023  RP, OTR/L  Acute Rehabilitation Services  Office:  [phone]   Suzanna Obey 03/14/2023, 3:24 PM

## 2023-03-14 NOTE — Progress Notes (Signed)
NAME:  [name], MRN:  [id number], DOB:  1930-03-29, LOS: 8 ADMISSION DATE:  03/05/2023, CONSULTATION DATE: 4/8  REFERRING MD:  Daiva Eves CHIEF COMPLAINT:  Generalized Weakness   History of Present Illness:  89 yr old male with significant pmhx of polymyalgia rheumatica on chronic steroids, recent hospitalization for aortitis, CHF, chronic leukocytosis, AAA with S/P stent graft in 2018, HLD, and DDD was admitted with an acute PE at [facility] ED and initially complaining of generalized weakness on 4/7. Patient developed SVT on the morning of 4/8 along with respiratory distress and hypoxia requiring non-re breather mask. Patient being managed by [facility], Cardiology, and Infectious Disease. PCCM consulted by ID to assist in managing the acute hypoxic respiratory failure.   Pertinent  Medical History   Past Medical History:  Diagnosis Date   AAA (abdominal aortic aneurysm)    Abnormal EKG    Left atrial abnormality   Allergic rhinitis    Allergy    Benign neoplasm of colon 04/14/10   3 small polyps on colonoscopy by Dr. Marina Goodell   Carpal tunnel syndrome    Cataract    Degenerative disc disease    Disturbances metabolism of methionine, homocystine, and cystathionine    Elevated homocysteine   Elevated prostate specific antigen (PSA)    Hearing loss    Hyperlipidemia    Impotence of organic origin    Penile implant   Internal hemorrhoids    Neck pain    Otosclerosis of both ears    Peripheral vascular disease    Bilateral femoral bruit   Plantar fasciitis    Polymyalgia rheumatica    Rotator cuff syndrome of left shoulder    Scoliosis    Shoulder pain      Significant Hospital Events: Including procedures, antibiotic start and stop dates in addition to other pertinent events   4/7 Admitted for Acute PE 4/8 Developed Rapid Atrial Flutter-HR 170s-180s , along with respiratory failure requiring NRB, PCCM consulted  4/12 feeling better, HHFNC 60%, 45L, steroids tapered 4/15 PMT consult, on   salter HFNC 8-10L,   Interim History / Subjective:  Code status changed to DNR last evening On precedex 1.2 overnight, rested.   On salter HFNC 8-10L Afebrile   Objective   Blood pressure 139/87, pulse 64, temperature 97.8 F (36.6 C), temperature source Axillary, resp. rate 18, height 6' (1.829 m), weight 71 kg, SpO2 98 %.        Intake/Output Summary (Last 24 hours) at 03/14/2023 0732 Last data filed at 03/14/2023 0500 Gross per 24 hour  Intake 861.59 ml  Output 1450 ml  Net -588.41 ml   Filed Weights   03/11/23 0500 03/13/23 0400 03/14/23 0520  Weight: 77.1 kg 70.9 kg 71 kg   Examination: Dex 1.2> 0.5 General:  chronically ill appearing elderly male sitting upright in bed HEENT: MM pink/dry, pupils 3/reactive Neuro:  somnolent, arouses, oriented to person, place, and year but yells out "help me, Im dying", "I need something for my nerves" CV: rr, SB> NSR, no murmur PULM:  non labored and normal RR sleeping with O2 sat 98% on 10L, awake becomes more shallow and tachypneic in 20s and sats drop off.  Scattered rales, dry cough, no wheeze GI: soft, bs+, NT/ ND Extremities: warm/dry, no LE edema  Skin: no rashes   UOP 1.4L / 24hrs  Net - 7.2 L  Afebrile  Resolved Hospital Problem list   Hyponatremia  Assessment & Plan:  Acute Hypoxic Respiratory Failure in  setting possibly of multifocal pna vs eosinophilic pna/ ILD/ autoimmune process   Acute PE with RLE DVT PMR on chronic prednisone, immunocompromised  Received 3 days of high dose steroids, completed 4/10 Daptomycin stopped 4/7 - sed rate down 138> 109 on 4/12 P: - cont to wean salter HFNC as able.  Delirium/ anxiety remains a barrier to weaning O2 while awake.   - pulmonary hygiene- IS, PT, OOB to chair > encourage - cont solumedrol  BID, will require a slow taper.  Baseline prednisone  daily - cont vanc/ zosyn, ID following - bactrim for PJP ppx - aspiration precautions - prn diuretics>defer today, net  -7L - cont apixaban  - DNR/ DNI as of 4/15 PM.  Ongoing GOC, appreciate PMT assistance.    Acute hyperactive delirium/ Anxiety - multifactorial with ICU LOS, hypoxia, steroids, sepsis, age.  - continue to minimize precedex as able, continue seroquel  am, increase to  qHS, trazadone  q HS, and change hydroxyzine to scheduled to see if we can wean precedex - ideally would avoid benzo's given his age, but may need to consider if unable to wean off precedex and pending GOC - delirium precautions> will need to arrange down time/ naps during the day and ensure un-interrupted rest at night - supportive care as above   Paroxysmal atrial flutter/atrial fibrillation - EF normal EF with no wall motion abnormalities and diastolic parameters are norma - cont metoprolol 12.5mg  BID, unable to titrate up given softer BP/ HR on precedex at this time.   PRN lopressor 2.5mg  for sustained HR > 120 - cont apixaban - optimize electrolytes, K and Mag  Hyperlipidemia - Cont Crestor  BPH - Cont tamsulosin  AAA s/p aortic graft Possible aortitis - ABX per ID.  Dapto stopped 4/7 for concern of eosinophilic PNA.  Followed at [facility] ID - Currently on vancomycin and Zosyn per ID - remains afebrile.  Repeat CBC in am.  Persistent leukocytosis could be related to steroids.   Prediabetes, steroid induced hyperglycemia  Patient hemoglobin A1c 6.3 - cont SSI prn, goal 140-180  At risk for malnutrition Deconditioning - maximize nutrition as able, Ensure TID - PT/ OT  GERD/ gastritis - exacerbated by steroids - continue PPI BID and pepcid with prn GI cocktail - aspiration precautions   Best Practice (right click and "Reselect all SmartList Selections" daily)   Diet/type: Regular consistency DVT prophylaxis: Apixaban GI prophylaxis: PPI Lines: N/A Foley:  N/A Code Status:  DNR Last date of multidisciplinary goals of care discussion [4/13: Patient, his wife and son  updated at  beside, decision was to continue full scope of care].  He would like to go for ventilator if needed as last resort for short-term only.  PMT consulted 4/15.  Changed to DNR/ DNI per request of wife 4/15 PM per EMD.  Ongoing GOC/ discussions  No family at bedside.   This patient is critically ill with multiple organ system failure which requires frequent high complexity decision making, assessment, support, evaluation, and titration of therapies. This was completed through the application of advanced monitoring technologies and extensive interpretation of multiple databases.  During this encounter critical care time was devoted to patient care services described in this note for 31 minutes.     Posey Boyer, MSN, AG-ACNP-BC [facility] 03/14/2023, 7:32 AM  See Amion for pager If no response to pager, please call PCCM consult pager After 7:00 pm call Elink

## 2023-03-14 NOTE — Progress Notes (Signed)
Subjective:  "I hurt all over"  Antibiotics:  Anti-infectives (From admission, onward)    Start     Dose/Rate Route Frequency Ordered Stop   03/13/23 2200  vancomycin (VANCOREADY) IVPB 750 mg/150 mL        750 mg 150 mL/hr over 60 Minutes Intravenous Every 12 hours 03/13/23 1131     03/10/23 2300  vancomycin (VANCOREADY) IVPB 1250 mg/250 mL  Status:  Discontinued        1,250 mg 166.7 mL/hr over 90 Minutes Intravenous Every 18 hours 03/10/23 2243 03/13/23 1131   03/10/23 2000  piperacillin-tazobactam (ZOSYN) IVPB 3.375 g        3.375 g 12.5 mL/hr over 240 Minutes Intravenous Every 8 hours 03/10/23 1140     03/06/23 1715  vancomycin (VANCOREADY) IVPB 1500 mg/300 mL  Status:  Discontinued        1,500 mg 150 mL/hr over 120 Minutes Intravenous  Once 03/06/23 1628 03/06/23 1628   03/06/23 1628  vancomycin (VANCOREADY) IVPB 1250 mg/250 mL  Status:  Discontinued        1,250 mg 166.7 mL/hr over 90 Minutes Intravenous Every 24 hours 03/06/23 1628 03/10/23 2243   03/06/23 0900  sulfamethoxazole-trimethoprim (BACTRIM DS) 800-160 MG per tablet 1 tablet        1 tablet Oral Once per day on Mon Wed Fri 03/05/23 2141     03/06/23 0800  ceFEPIme (MAXIPIME) 2 g in sodium chloride 0.9 % 100 mL IVPB  Status:  Discontinued        2 g 200 mL/hr over 30 Minutes Intravenous Every 12 hours 03/05/23 2140 03/10/23 1140   03/05/23 1845  DAPTOmycin (CUBICIN) 600 mg in sodium chloride 0.9 % IVPB  Status:  Discontinued        600 mg 124 mL/hr over 30 Minutes Intravenous Daily 03/05/23 1833 03/06/23 1546   03/05/23 1645  ceFEPIme (MAXIPIME) 2 g in sodium chloride 0.9 % 100 mL IVPB        2 g 200 mL/hr over 30 Minutes Intravenous  Once 03/05/23 1639 03/05/23 2018   03/05/23 1645  vancomycin (VANCOREADY) IVPB 1500 mg/300 mL  Status:  Discontinued        1,500 mg 150 mL/hr over 120 Minutes Intravenous  Once 03/05/23 1639 03/05/23 1645       Medications: Scheduled Meds:  alteplase  2 mg  Intracatheter Once   apixaban  5 mg Oral BID   Chlorhexidine Gluconate Cloth  6 each Topical Daily   clonazepam  0.25 mg Oral Once   famotidine  20 mg Oral Daily   feeding supplement  237 mL Oral TID BM   fluorometholone  1 drop Right Eye QHS   folic acid  1 mg Oral Daily   hydrOXYzine  25 mg Oral Q6H   insulin aspart  0-15 Units Subcutaneous TID WC   insulin aspart  0-5 Units Subcutaneous QHS   melatonin  3 mg Oral QHS   methylPREDNISolone (SOLU-MEDROL) injection  40 mg Intravenous Q12H   metoprolol tartrate  12.5 mg Oral BID   pantoprazole  40 mg Oral BID   QUEtiapine  25 mg Oral Daily   QUEtiapine  50 mg Oral QHS   rosuvastatin  5 mg Oral QHS   senna-docusate  1 tablet Oral BID   sodium chloride flush  10-40 mL Intracatheter Q12H   sulfamethoxazole-trimethoprim  1 tablet Oral Once per day on Mon Wed Fri   traZODone  200  mg Oral QHS   Continuous Infusions:  sodium chloride Stopped (03/10/23 1127)   dexmedetomidine (PRECEDEX) IV infusion 0.3 mcg/kg/hr (03/14/23 1210)   piperacillin-tazobactam (ZOSYN)  IV Stopped (03/14/23 9147)   vancomycin Stopped (03/14/23 1058)   PRN Meds:.sodium chloride, acetaminophen **OR** acetaminophen, alum & mag hydroxide-simeth, dicyclomine, lidocaine, menthol-cetylpyridinium, ondansetron (ZOFRAN) IV, mouth rinse, sodium chloride flush    Objective: Weight change: 0.1 kg  Intake/Output Summary (Last 24 hours) at 03/14/2023 1336 Last data filed at 03/14/2023 1200 Gross per 24 hour  Intake 859.61 ml  Output 1700 ml  Net -840.39 ml    Blood pressure 127/68, pulse 70, temperature 97.7 F (36.5 C), temperature source Axillary, resp. rate (!) 23, height 6' (1.829 m), weight 71 kg, SpO2 95 %. Temp:  [97.7 F (36.5 C)-98.3 F (36.8 C)] 97.7 F (36.5 C) (04/16 1100) Pulse Rate:  [49-94] 70 (04/16 1300) Resp:  [18-31] 23 (04/16 1300) BP: (93-170)/(57-91) 127/68 (04/16 1300) SpO2:  [83 %-99 %] 95 % (04/16 1300) Weight:  [71 kg] 71 kg (04/16  0520)  Physical Exam: Physical Exam HENT:     Head: Normocephalic and atraumatic.  Eyes:     General:        Left eye: No discharge.     Extraocular Movements: Extraocular movements intact.  Cardiovascular:     Rate and Rhythm: Tachycardia present.  Pulmonary:     Breath sounds: No wheezing.  Abdominal:     Palpations: Abdomen is soft.  Neurological:     General: No focal deficit present.     Mental Status: He is disoriented.  Psychiatric:        Attention and Perception: He is inattentive.        Mood and Affect: Mood is anxious and depressed.        Speech: Speech is delayed.        Behavior: Behavior is cooperative.        Cognition and Memory: Cognition is impaired. Memory is impaired.      CBC:    BMET Recent Labs    03/13/23 0348 03/14/23 0417  NA 141 139  K 3.4* 4.0  CL 107 105  CO2 26 24  GLUCOSE 162* 129*  BUN 41* 36*  CREATININE 1.12 0.95  CALCIUM 7.7* 7.9*      Liver Panel  Recent Labs    03/14/23 0417  ALBUMIN 2.2*        Sedimentation Rate No results for input(s): "ESRSEDRATE" in the last 72 hours.  C-Reactive Protein No results for input(s): "CRP" in the last 72 hours.   Micro Results: Recent Results (from the past 720 hour(s))  Culture, blood (routine x 2)     Status: None   Collection Time: 03/05/23  4:14 PM   Specimen: BLOOD  Result Value Ref Range Status   Specimen Description BLOOD SITE NOT SPECIFIED  Final   Special Requests   Final    BOTTLES DRAWN AEROBIC AND ANAEROBIC Blood Culture adequate volume   Culture   Final    NO GROWTH 5 DAYS Performed at [facility], [address] [place], [place] [address]    Report Status 03/10/2023 FINAL  Final  Resp panel by RT-PCR (RSV, Flu A&B, Covid) Anterior Nasal Swab     Status: None   Collection Time: 03/05/23  4:29 PM   Specimen: Anterior Nasal Swab  Result Value Ref Range Status   SARS Coronavirus 2 by RT PCR NEGATIVE NEGATIVE Final   Influenza A  by PCR  NEGATIVE NEGATIVE Final   Influenza B by PCR NEGATIVE NEGATIVE Final    Comment: (NOTE) The Xpert Xpress SARS-CoV-2/FLU/RSV plus assay is intended as an aid in the diagnosis of influenza from Nasopharyngeal swab specimens and should not be used as a sole basis for treatment. Nasal washings and aspirates are unacceptable for Xpert Xpress SARS-CoV-2/FLU/RSV testing.  Fact Sheet for Patients: BloggerCourse.com  Fact Sheet for Healthcare Providers: SeriousBroker.it  This test is not yet approved or cleared by the [place] FDA and has been authorized for detection and/or diagnosis of SARS-CoV-2 by FDA under an Emergency Use Authorization (EUA). This EUA will remain in effect (meaning this test can be used) for the duration of the COVID-19 declaration under Section 564(b)(1) of the Act, 21 U.S.C. section 360bbb-3(b)(1), unless the authorization is terminated or revoked.     Resp Syncytial Virus by PCR NEGATIVE NEGATIVE Final    Comment: (NOTE) Fact Sheet for Patients: BloggerCourse.com  Fact Sheet for Healthcare Providers: SeriousBroker.it  This test is not yet approved or cleared by the [place] FDA and has been authorized for detection and/or diagnosis of SARS-CoV-2 by FDA under an Emergency Use Authorization (EUA). This EUA will remain in effect (meaning this test can be used) for the duration of the COVID-19 declaration under Section 564(b)(1) of the Act, 21 U.S.C. section 360bbb-3(b)(1), unless the authorization is terminated or revoked.  Performed at [facility], [address] [place], [place] [address]   Culture, blood (routine x 2)     Status: None   Collection Time: 03/05/23  4:33 PM   Specimen: BLOOD LEFT FOREARM  Result Value Ref Range Status   Specimen Description BLOOD LEFT FOREARM  Final   Special Requests   Final    BOTTLES DRAWN AEROBIC AND  ANAEROBIC Blood Culture results may not be optimal due to an excessive volume of blood received in culture bottles   Culture   Final    NO GROWTH 5 DAYS Performed at [facility], [address] [place], [place] [address]    Report Status 03/10/2023 FINAL  Final  MRSA Next Gen by PCR, Nasal     Status: None   Collection Time: 03/06/23  8:34 PM   Specimen: Nasal Mucosa; Nasal Swab  Result Value Ref Range Status   MRSA by PCR Next Gen NOT DETECTED NOT DETECTED Final    Comment: (NOTE) The GeneXpert MRSA Assay (FDA approved for NASAL specimens only), is one component of a comprehensive MRSA colonization surveillance program. It is not intended to diagnose MRSA infection nor to guide or monitor treatment for MRSA infections. Test performance is not FDA approved in patients less than 43 years old. Performed at [facility], [address] [place], [place] [address]     Studies/Results: No results found.    Assessment/Plan:  INTERVAL HISTORY:   He is now DNR/DNI  Principal Problem:   Acute pulmonary embolism Active Problems:   Hyperlipidemia   PMR (polymyalgia rheumatica)   AAA (abdominal aortic aneurysm) without rupture (HCC)   Generalized weakness   Acute hypoxic respiratory failure   Acute hyponatremia   Elevated troponin   Lactic acidosis   HCAP (healthcare-associated pneumonia)   Chronic diastolic CHF (congestive heart failure)   BPH (benign prostatic hyperplasia)   Pulmonary embolism   Eosinophilic pneumonia   Aortitis   Atrial flutter   FUO (fever of unknown origin)   S/P insertion of endovascular thoracic aortic stent graft  Drug-induced insomnia   Anxiety   DVT, lower extremity, distal, acute, right   PAF (paroxysmal atrial fibrillation)   Acute metabolic encephalopathy   Paroxysmal A-fib   Pressure injury of skin    CHUCKY HOMES is a 89 y.o. male with complicated medical history including aortic aneurysm status post placement of  stent graft, polymyalgia rheumatica that has been well-controlled on prednisone who has been experiencing malaise for nearly 8 months with intermittent fevers and elevated inflammatory markers, who was worked up for this condition by infectious disease at [facility] including PET scan that showed increased uptake in the aorta near stent graft with question of aortitis.  The tagged white cell scan subsequently did not show uptake in this area or anywhere else in the body.  I reviewed the case with Dr.Julia Dolores Frame yesterday over the phone and she stated that they had reviewed the PET scan again with radiology and that radiology is not entirely convinced of the uptake being particularly dramatic.  Regardless the patient was the begun on empiric antibiotic regimen for possible bacterial infectious aortitis with daptomycin and ceftriaxone.  The plan was to see how the patient did on this including monitoring his inflammatory markers.  All along however there has been suspicion that there might be an autoimmune process involved and the patient is followed closely by Dr. Alberteen Spindle with nephrology at [facility].  He has now been admitted to [facility] with an acute pulmonary embolism and a CT angiogram that also shows multifocal infiltrates.  He is experienced progressive hypoxemic respiratory failure requiring high flow oxygen currently on high flow oxygen via nasal cannula.  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  His antibiotics switched to vancomycin and cefepime having been moved from daptomycin ceftriaxone disease.  CCM now gotten rid of cefpime in favor of zosyn due to concerns for CNS side effects of cefepime/  He improved from an oxygenation stand point over weekend on lower dose steroids with diuresis and he is with less anxiety  though his delirium is still an issue currently on precedex with attempts to wean him off of this and up titrate seroquel  #1  Pulmonary embolism with heart strain and DVT identified in the right lower extremity  On anticoagulation  #2 Pneumonia: favor eosinophilic PNA vs perhaps more likely declaration of autoimmune pulmonary pathology  His steroids have been dropped but he has actually improved from O2 standpoint after diuresis  We will have a low threshold to switch the vanco to doxy   #3 ? Aortitis: his original stop date for his empiric antibiotics was 425 and we will likely hold with that but looking more likely he will complete antibiotics in the hospitalt was responsible for his elevated inflammatory markers  #4  Suspected underlying autoimmune disease potentially with pulmonary complications and patient with known polymyalgia rheumatica:  He is on corticosteroids    #5 Delirium: this along with his anxiety and need for precedex is his biggest acute problem and CCM working on this   #6 Goals of care: Patient's wife and son are going to meet with palliative care again today.  Palliative came and met with the patient and 1 son earlier.  Had extensive conversation with patient's wife and son today.  Difficult situation because certainly before his delriium kicked in he was highly functioning and it would see appropriate  to continue aggressive care but currently his delrium is a tremendous problem currently.  I have personally spent 54 minutes involved in face-to-face and non-face-to-face activities for this patient on the day of the visit. Professional time spent includes the following activities: Preparing to see the patient (review of tests), Obtaining and/or reviewing separately obtained history (admission/discharge record), Performing a medically appropriate examination and/or evaluation , Ordering medications/tests/procedures, referring and communicating with other health care  professionals, Documenting clinical information in the EMR, Independently interpreting results (not separately reported), Communicating results to the patient/family/caregiver, Counseling and educating the patient/family/caregiver and Care coordination (not separately reported).    LOS: 8 days   Acey Lav 03/14/2023, 1:36 PM

## 2023-03-14 NOTE — Progress Notes (Signed)
[facility]H14 [facility] Liaison Note  Met with wife and son after requesting additional information regarding hospice services through [facility] referral center.  Explained hospice philosophy and various services including hospice at home, hospice in LTC and our inpatient facilities.  All questions answered and information left with wife. Wife to reach out with any additional questions.  Liaison team will follow patient peripherally as disposition for patient determined.    Please reach out with any additional hospice related questions or concerns.  Doreatha Martin, RN, BSN [facility] Liaison [phone]

## 2023-03-15 DIAGNOSIS — I714 Abdominal aortic aneurysm, without rupture, unspecified: Secondary | ICD-10-CM | POA: Diagnosis not present

## 2023-03-15 DIAGNOSIS — I2609 Other pulmonary embolism with acute cor pulmonale: Secondary | ICD-10-CM | POA: Diagnosis not present

## 2023-03-15 DIAGNOSIS — J8282 Acute eosinophilic pneumonia: Secondary | ICD-10-CM | POA: Diagnosis not present

## 2023-03-15 DIAGNOSIS — R5381 Other malaise: Secondary | ICD-10-CM | POA: Diagnosis not present

## 2023-03-15 DIAGNOSIS — G9341 Metabolic encephalopathy: Secondary | ICD-10-CM | POA: Diagnosis not present

## 2023-03-15 DIAGNOSIS — I259 Chronic ischemic heart disease, unspecified: Secondary | ICD-10-CM

## 2023-03-15 DIAGNOSIS — I2699 Other pulmonary embolism without acute cor pulmonale: Secondary | ICD-10-CM | POA: Diagnosis not present

## 2023-03-15 DIAGNOSIS — R509 Fever, unspecified: Secondary | ICD-10-CM | POA: Diagnosis not present

## 2023-03-15 DIAGNOSIS — R531 Weakness: Secondary | ICD-10-CM | POA: Diagnosis not present

## 2023-03-15 DIAGNOSIS — J9601 Acute respiratory failure with hypoxia: Secondary | ICD-10-CM | POA: Diagnosis not present

## 2023-03-15 DIAGNOSIS — R410 Disorientation, unspecified: Secondary | ICD-10-CM | POA: Diagnosis not present

## 2023-03-15 DIAGNOSIS — F419 Anxiety disorder, unspecified: Secondary | ICD-10-CM

## 2023-03-15 LAB — BASIC METABOLIC PANEL
Anion gap: 11 (ref 5–15)
BUN: 33 mg/dL — ABNORMAL HIGH (ref 8–23)
CO2: 23 mmol/L (ref 22–32)
Calcium: 8.2 mg/dL — ABNORMAL LOW (ref 8.9–10.3)
Chloride: 105 mmol/L (ref 98–111)
Creatinine, Ser: 1.1 mg/dL (ref 0.61–1.24)
GFR, Estimated: >60 mL/min (ref >60)
Glucose, Bld: 162 mg/dL — ABNORMAL HIGH (ref 70–99)
Potassium: 3.9 mmol/L (ref 3.5–5.1)
Sodium: 139 mmol/L (ref 135–145)

## 2023-03-15 LAB — GLUCOSE, CAPILLARY
Glucose-Capillary: 137 mg/dL — ABNORMAL HIGH (ref 70–99)
Glucose-Capillary: 131 mg/dL — ABNORMAL HIGH (ref 70–99)
Glucose-Capillary: 161 mg/dL — ABNORMAL HIGH (ref 70–99)

## 2023-03-15 LAB — TROPONIN I (HIGH SENSITIVITY)
Troponin I (High Sensitivity): 130 ng/L (ref <18)
Troponin I (High Sensitivity): 128 ng/L (ref <18)

## 2023-03-15 LAB — SEDIMENTATION RATE: Sed Rate: 70 mm/hr — ABNORMAL HIGH (ref 0–16)

## 2023-03-15 MED ORDER — QUETIAPINE FUMARATE 25 MG PO TABS
12.5000 mg | ORAL_TABLET | Freq: Every day | ORAL | Status: DC
  Administered 2023-03-16 – 2023-03-23 (×8): 12.5 mg via ORAL
  Filled 2023-03-15 (×8): qty 1

## 2023-03-15 MED ORDER — ASPIRIN 81 MG PO CHEW
81.0000 mg | CHEWABLE_TABLET | Freq: Every day | ORAL | Status: DC
  Administered 2023-03-15 – 2023-03-23 (×9): 81 mg via ORAL
  Filled 2023-03-15 (×9): qty 1

## 2023-03-15 MED ORDER — SODIUM CHLORIDE 0.9 % IV SOLN
2.0000 g | Freq: Every day | INTRAVENOUS | Status: AC
  Administered 2023-03-15 – 2023-03-23 (×9): 2 g via INTRAVENOUS
  Filled 2023-03-15 (×9): qty 20

## 2023-03-15 MED ORDER — DOXYCYCLINE HYCLATE 100 MG PO TABS
100.0000 mg | ORAL_TABLET | Freq: Two times a day (BID) | ORAL | Status: DC
  Administered 2023-03-15 – 2023-03-23 (×16): 100 mg via ORAL
  Filled 2023-03-15 (×16): qty 1

## 2023-03-15 MED ORDER — LORAZEPAM 1 MG PO TABS
0.5000 mg | ORAL_TABLET | Freq: Once | ORAL | Status: AC
  Administered 2023-03-15: 0.5 mg via ORAL
  Filled 2023-03-15: qty 1

## 2023-03-15 MED ORDER — HYDRALAZINE HCL 20 MG/ML IJ SOLN
10.0000 mg | INTRAMUSCULAR | Status: DC | PRN
  Administered 2023-03-15 – 2023-03-17 (×2): 10 mg via INTRAVENOUS
  Filled 2023-03-15 (×2): qty 1

## 2023-03-15 NOTE — Progress Notes (Signed)
Patient ID: [name], male   DOB: 1930-04-16, 89 y.o.   MRN: [id number]    Progress Note from the Palliative Medicine Team at [facility]   Patient Name: [name]        Date: 03/15/2023 DOB: 1930-02-29  Age: 89 y.o. MRN#: [id number] Attending Physician: Steffanie Dunn, DO Primary Care Physician: [name], MD Admit Date: 03/05/2023   Medical records reviewed, assessed patient, discussed with treatment team  89 y.o. male   admitted on 03/05/2023 with significant pmhx of polymyalgia rheumatica on chronic steroids, recent hospitalization for aortitis, CHF, chronic leukocytosis, AAA with S/P stent graft in 2018, HLD, and DDD was admitted with an acute PE at [facility] ED and initially complaining of generalized weakness on 4/7. Patient developed SVT on the morning of 4/8 along with respiratory distress and hypoxia requiring non-re breather mask. Patient being managed by [facility], Cardiology, and Infectious Disease.  PMT working with family to help establish [facility] Events    4/7 Admitted for Acute PE 4/8 Developed Rapid Atrial Flutter-HR 170s-180s , along with respiratory failure requiring NRB, PCCM consulted  4/12 feeling better, HHFNC 60%, 45L, steroids tapered 03/13/23 ongoing delirium, high risk for decompensation 03-15-23 More alert today, intermittent confused and agitated --consideration for transfer out of ICU   Patient does not have medical decision capacity at this time.  Patient's wife is HPOA   Family face ongoing  treatment option decisions, advanced directive decisions and anticipatory care needs.     This NP assessed patient at the bedside as a follow up for palliative medicine  needs and emotional support.   Wife at bedside  Continued conversation regarding current medical situation.  Wife understands the seriousness of his condition and states her understanding that "he will not get back to where he was".    [name] has spoken to [facility], she is  exploring options as she prepares and hopes for patient to discharge home.  Education offered on hospice benefit; philosophy and eligibility.   Explanation offered on the importance of establishing plan of care specific to ongoing life prolonging measures verse a shift to a full comfort path.  MOST form introduced.   Plan of Care -DNR/DNI -no artifical feeding now or in the future -continue with current medical interventions as family process and make decisions for future care plan, wife is leaning toward a comfort path utilizing hospice benefit in the home   This nurse practitioner informed family and the attending that I will be out of the hospital until Monday morning.   PMT will continue to support holistically.  Hermelinda Dellen RN with PMT will f/u with family tomorrow   Call palliative medicine team phone # [phone] with questions or concerns.   Time:  50  minutes  Detailed review of medical records, medically appropriate exam (MS, labs   discussed with treatment team, counseling and education to patient, family, staff, documenting clinical information, medication management, coordination of care    Lorinda Creed NP  Palliative Medicine Team Team Phone # [phone] Pager [phone]

## 2023-03-15 NOTE — Progress Notes (Signed)
eLink Physician-Brief Progress Note Patient Name: MACLOVIO HENSON DOB: 1930/07/14 MRN: [id number]   Date of Service  03/15/2023  HPI/Events of Note  Patient complaining of left sided chest pain, and feeling anxious.  EKG benign.  eICU Interventions  Will cycle Troponin and give  po Ativan 0.5 mg  x 1,        Ahlana Slaydon U Noelani Harbach 03/15/2023, 6:37 AM

## 2023-03-15 NOTE — Progress Notes (Signed)
Date and time results received: 03/15/23 0825  Test: Troponin Critical Value: 130  Name of Provider Notified: Vernona Rieger Clark,MD  Orders Received? Or Actions Taken?:  Monitor trends. Vitals stable.

## 2023-03-15 NOTE — Progress Notes (Signed)
Subjective:  He was not complaining to me but reportedly he had chest pain earlier  Antibiotics:  Anti-infectives (From admission, onward)    Start     Dose/Rate Route Frequency Ordered Stop   03/15/23 2200  doxycycline (VIBRA-TABS) tablet 100 mg        100 mg Oral Every 12 hours 03/15/23 1109 03/23/23 2359   03/15/23 1400  cefTRIAXone (ROCEPHIN) 2 g in sodium chloride 0.9 % 100 mL IVPB        2 g 200 mL/hr over 30 Minutes Intravenous Daily 03/15/23 1109 03/23/23 2359   03/13/23 2200  vancomycin (VANCOREADY) IVPB 750 mg/150 mL  Status:  Discontinued        750 mg 150 mL/hr over 60 Minutes Intravenous Every 12 hours 03/13/23 1131 03/15/23 1109   03/10/23 2300  vancomycin (VANCOREADY) IVPB 1250 mg/250 mL  Status:  Discontinued        1,250 mg 166.7 mL/hr over 90 Minutes Intravenous Every 18 hours 03/10/23 2243 03/13/23 1131   03/10/23 2000  piperacillin-tazobactam (ZOSYN) IVPB 3.375 g  Status:  Discontinued        3.375 g 12.5 mL/hr over 240 Minutes Intravenous Every 8 hours 03/10/23 1140 03/15/23 1109   03/06/23 1715  vancomycin (VANCOREADY) IVPB 1500 mg/300 mL  Status:  Discontinued        1,500 mg 150 mL/hr over 120 Minutes Intravenous  Once 03/06/23 1628 03/06/23 1628   03/06/23 1628  vancomycin (VANCOREADY) IVPB 1250 mg/250 mL  Status:  Discontinued        1,250 mg 166.7 mL/hr over 90 Minutes Intravenous Every 24 hours 03/06/23 1628 03/10/23 2243   03/06/23 0900  sulfamethoxazole-trimethoprim (BACTRIM DS) 800-160 MG per tablet 1 tablet        1 tablet Oral Once per day on Mon Wed Fri 03/05/23 2141     03/06/23 0800  ceFEPIme (MAXIPIME) 2 g in sodium chloride 0.9 % 100 mL IVPB  Status:  Discontinued        2 g 200 mL/hr over 30 Minutes Intravenous Every 12 hours 03/05/23 2140 03/10/23 1140   03/05/23 1845  DAPTOmycin (CUBICIN) 600 mg in sodium chloride 0.9 % IVPB  Status:  Discontinued        600 mg 124 mL/hr over 30 Minutes Intravenous Daily 03/05/23 1833 03/06/23  1546   03/05/23 1645  ceFEPIme (MAXIPIME) 2 g in sodium chloride 0.9 % 100 mL IVPB        2 g 200 mL/hr over 30 Minutes Intravenous  Once 03/05/23 1639 03/05/23 2018   03/05/23 1645  vancomycin (VANCOREADY) IVPB 1500 mg/300 mL  Status:  Discontinued        1,500 mg 150 mL/hr over 120 Minutes Intravenous  Once 03/05/23 1639 03/05/23 1645       Medications: Scheduled Meds:  alteplase  2 mg Intracatheter Once   apixaban  5 mg Oral BID   aspirin  81 mg Oral Daily   Chlorhexidine Gluconate Cloth  6 each Topical Daily   doxycycline  100 mg Oral Q12H   famotidine  20 mg Oral Daily   feeding supplement  237 mL Oral TID BM   fluorometholone  1 drop Right Eye QHS   folic acid  1 mg Oral Daily   hydrOXYzine  25 mg Oral Q6H   insulin aspart  0-15 Units Subcutaneous TID WC   insulin aspart  0-5 Units Subcutaneous QHS   melatonin  3 mg Oral  QHS   methylPREDNISolone (SOLU-MEDROL) injection  40 mg Intravenous Q12H   metoprolol tartrate  12.5 mg Oral BID   pantoprazole  40 mg Oral BID   QUEtiapine  25 mg Oral Daily   QUEtiapine  50 mg Oral QHS   rosuvastatin  5 mg Oral QHS   senna-docusate  1 tablet Oral BID   sodium chloride flush  10-40 mL Intracatheter Q12H   sulfamethoxazole-trimethoprim  1 tablet Oral Once per day on Mon Wed Fri   traZODone  200 mg Oral QHS   Continuous Infusions:  sodium chloride Stopped (03/10/23 1127)   cefTRIAXone (ROCEPHIN)  IV     PRN Meds:.sodium chloride, acetaminophen **OR** acetaminophen, alum & mag hydroxide-simeth, dicyclomine, hydrALAZINE, lidocaine, menthol-cetylpyridinium, ondansetron (ZOFRAN) IV, mouth rinse, sodium chloride flush    Objective: Weight change: -0.4 kg  Intake/Output Summary (Last 24 hours) at 03/15/2023 1148 Last data filed at 03/15/2023 1100 Gross per 24 hour  Intake 616.07 ml  Output 1300 ml  Net -683.93 ml    Blood pressure 104/67, pulse 75, temperature 97.8 F (36.6 C), temperature source Oral, resp. rate 16, height 6'  (1.829 m), weight 70.6 kg, SpO2 100 %. Temp:  [97.8 F (36.6 C)-98.6 F (37 C)] 97.8 F (36.6 C) (04/17 1136) Pulse Rate:  [70-119] 75 (04/17 1100) Resp:  [16-33] 16 (04/17 1100) BP: (104-186)/(67-123) 104/67 (04/17 1100) SpO2:  [87 %-100 %] 100 % (04/17 1100) Weight:  [70.6 kg] 70.6 kg (04/17 0500)  Physical Exam: Physical Exam HENT:     Head: Normocephalic and atraumatic.     Nose: Nose normal.  Eyes:     Extraocular Movements: Extraocular movements intact.     Conjunctiva/sclera: Conjunctivae normal.  Cardiovascular:     Rate and Rhythm: Tachycardia present.  Pulmonary:     Effort: No respiratory distress.  Abdominal:     General: Abdomen is flat.     Palpations: Abdomen is soft.  Skin:    General: Skin is warm and dry.  Neurological:     General: No focal deficit present.     Mental Status: He is disoriented.     Less anxious CBC:    BMET Recent Labs    03/14/23 0417 03/15/23 0402  NA 139 139  K 4.0 3.9  CL 105 105  CO2 24 23  GLUCOSE 129* 162*  BUN 36* 33*  CREATININE 0.95 1.10  CALCIUM 7.9* 8.2*      Liver Panel  Recent Labs    03/14/23 0417  ALBUMIN 2.2*        Sedimentation Rate Recent Labs    03/15/23 0402  ESRSEDRATE 70*    C-Reactive Protein No results for input(s): "CRP" in the last 72 hours.   Micro Results: Recent Results (from the past 720 hour(s))  Culture, blood (routine x 2)     Status: None   Collection Time: 03/05/23  4:14 PM   Specimen: BLOOD  Result Value Ref Range Status   Specimen Description BLOOD SITE NOT SPECIFIED  Final   Special Requests   Final    BOTTLES DRAWN AEROBIC AND ANAEROBIC Blood Culture adequate volume   Culture   Final    NO GROWTH 5 DAYS Performed at [facility], [address], [place], [place] [address]    Report Status 03/10/2023 FINAL  Final  Resp panel by RT-PCR (RSV, Flu A&B, Covid) Anterior Nasal Swab     Status: None   Collection Time: 03/05/23  4:29 PM   Specimen:  Anterior Nasal Swab  Result Value Ref Range Status   SARS Coronavirus 2 by RT PCR NEGATIVE NEGATIVE Final   Influenza A by PCR NEGATIVE NEGATIVE Final   Influenza B by PCR NEGATIVE NEGATIVE Final    Comment: (NOTE) The Xpert Xpress SARS-CoV-2/FLU/RSV plus assay is intended as an aid in the diagnosis of influenza from Nasopharyngeal swab specimens and should not be used as a sole basis for treatment. Nasal washings and aspirates are unacceptable for Xpert Xpress SARS-CoV-2/FLU/RSV testing.  Fact Sheet for Patients: BloggerCourse.com  Fact Sheet for Healthcare Providers: SeriousBroker.it  This test is not yet approved or cleared by the [place] FDA and has been authorized for detection and/or diagnosis of SARS-CoV-2 by FDA under an Emergency Use Authorization (EUA). This EUA will remain in effect (meaning this test can be used) for the duration of the COVID-19 declaration under Section 564(b)(1) of the Act, 21 U.S.C. section 360bbb-3(b)(1), unless the authorization is terminated or revoked.     Resp Syncytial Virus by PCR NEGATIVE NEGATIVE Final    Comment: (NOTE) Fact Sheet for Patients: BloggerCourse.com  Fact Sheet for Healthcare Providers: SeriousBroker.it  This test is not yet approved or cleared by the [place] FDA and has been authorized for detection and/or diagnosis of SARS-CoV-2 by FDA under an Emergency Use Authorization (EUA). This EUA will remain in effect (meaning this test can be used) for the duration of the COVID-19 declaration under Section 564(b)(1) of the Act, 21 U.S.C. section 360bbb-3(b)(1), unless the authorization is terminated or revoked.  Performed at [facility], [address], [place], [place] [address]   Culture, blood (routine x 2)     Status: None   Collection Time: 03/05/23  4:33 PM   Specimen: BLOOD LEFT FOREARM  Result  Value Ref Range Status   Specimen Description BLOOD LEFT FOREARM  Final   Special Requests   Final    BOTTLES DRAWN AEROBIC AND ANAEROBIC Blood Culture results may not be optimal due to an excessive volume of blood received in culture bottles   Culture   Final    NO GROWTH 5 DAYS Performed at [facility], [address], [place], [place] [address]    Report Status 03/10/2023 FINAL  Final  MRSA Next Gen by PCR, Nasal     Status: None   Collection Time: 03/06/23  8:34 PM   Specimen: Nasal Mucosa; Nasal Swab  Result Value Ref Range Status   MRSA by PCR Next Gen NOT DETECTED NOT DETECTED Final    Comment: (NOTE) The GeneXpert MRSA Assay (FDA approved for NASAL specimens only), is one component of a comprehensive MRSA colonization surveillance program. It is not intended to diagnose MRSA infection nor to guide or monitor treatment for MRSA infections. Test performance is not FDA approved in patients less than 40 years old. Performed at [facility], [address], [place], [place] [address]     Studies/Results: No results found.    Assessment/Plan:  INTERVAL HISTORY:   Anxiety has been stably managed off of Precedex since 3 PM yesterday unfortunately has had jump in his troponins from the 30s into the 100 this morning when he apparently was complaining of chest pain  Principal Problem:   Acute pulmonary embolism Active Problems:   Hyperlipidemia   PMR (polymyalgia rheumatica)   AAA (abdominal aortic aneurysm) without rupture (HCC)   Generalized weakness   Acute hypoxic respiratory failure   Acute hyponatremia   Elevated troponin  Lactic acidosis   HCAP (healthcare-associated pneumonia)   Chronic diastolic CHF (congestive heart failure)   BPH (benign prostatic hyperplasia)   Pulmonary embolism   Eosinophilic pneumonia   Aortitis   Atrial flutter   FUO (fever of unknown origin)   S/P insertion of endovascular thoracic aortic stent graft   Drug-induced  insomnia   Anxiety   DVT, lower extremity, distal, acute, right   PAF (paroxysmal atrial fibrillation)   Acute metabolic encephalopathy   Paroxysmal A-fib   Pressure injury of skin   Delirium   Acute eosinophilic pneumonia    [name] is a 89 y.o. male with complicated medical history including aortic aneurysm status post placement of stent graft, polymyalgia rheumatica that has been well-controlled on prednisone who has been experiencing malaise for nearly 8 months with intermittent fevers and elevated inflammatory markers, who was worked up for this condition by infectious disease at [facility] including PET scan that showed increased uptake in the aorta near stent graft with question of aortitis.  The tagged white cell scan subsequently did not show uptake in this area or anywhere else in the body.  I reviewed the case with Dr.Julia Dolores Frame yesterday over the phone and she stated that they had reviewed the PET scan again with radiology and that radiology is not entirely convinced of the uptake being particularly dramatic.  Regardless the patient was the begun on empiric antibiotic regimen for possible bacterial infectious aortitis with daptomycin and ceftriaxone.  The plan was to see how the patient did on this including monitoring his inflammatory markers.  All along however there has been suspicion that there might be an autoimmune process involved and the patient is followed closely by Dr. Alberteen Spindle with nephrology at [facility].  He has now been admitted to [facility] with an acute pulmonary embolism and a CT angiogram that also shows multifocal infiltrates.  He is experienced progressive hypoxemic respiratory failure requiring high flow oxygen currently on high flow oxygen via nasal cannula.  In addition to anticoagulation he is on corticosteroids for potential eosinophilic pneumonia from daptomycin/autoimmune pathology/COP/BOOP  He has been moved from regular medicine  floor to the ICU.  His antibiotics switched to vancomycin and cefepime having been moved from daptomycin ceftriaxone disease.  CCM now gotten rid of cefpime in favor of zosyn due to concerns for CNS side effects of cefepime/  He improved from an oxygenation stand point over weekend on lower dose steroids with diuresis and he is with less anxiety though his delirium is still an issue with being on precedex though now sp seroquel and small dose ativan  Troponins have risen now  #1  Pulmonary embolism with heart strain and DVT identified in the right lower extremity  On anticoagulation  #2 Pneumonia: favor eosinophilic PNA vs perhaps more likely declaration of autoimmune pulmonary pathology  His steroids have been dropped but he has actually improved from O2 standpoint after diuresis  He has had MORE than sufficient coverage for MRSA, pseudomonas in lungs and we put it back to ceftriaxone and oral doxycycline to treat the aortitis see below   #3 ? Aortitis: his original stop date for his empiric antibiotics was 425  As above we are changing to ceftriaxone and doxycycline. If he nears DC would be completely OK with change to purely oral regimen  #4  Suspected underlying autoimmune disease potentially with pulmonary complications and patient with known polymyalgia rheumatica:  He is on corticosteroids  ESR is down  fwiw  # + troponins: does have PE but ? Demand ischemia, coronary event   #5 Delirium: this along with his anxiety and need for precedex is his biggest acute problem and CCM working on this. WIfe is asking and this is reasonable whether we CAN HE HAVE LESS INTERRUPTION esp  AT NIGHT FOR FINGER STICKS, TURNING FOR EX SINCE SMALLEST PERTUBATIONS THROW HIM OFF     #6 Goals of care: Patient's wife and son have been meeting with Palliative care  I have personally spent 54 minutes involved in face-to-face and non-face-to-face activities for this patient on the day of the visit.  Professional time spent includes the following activities: Preparing to see the patient (review of tests), Obtaining and/or reviewing separately obtained history (admission/discharge record), Performing a medically appropriate examination and/or evaluation , Ordering medications/tests/procedures, referring and communicating with other health care professionals, Documenting clinical information in the EMR, Independently interpreting results (not separately reported), Communicating results to the patient/family/caregiver, Counseling and educating the patient/family/caregiver and Care coordination (not separately reported).       LOS: 9 days   Acey Lav 03/15/2023, 11:48 AM

## 2023-03-15 NOTE — Progress Notes (Addendum)
NAME:  [name], MRN:  [id number], DOB:  16-May-1930, LOS: 9 ADMISSION DATE:  03/05/2023, CONSULTATION DATE: 4/8  REFERRING MD:  Daiva Eves CHIEF COMPLAINT:  Generalized Weakness   History of Present Illness:  89 yr old male with significant pmhx of polymyalgia rheumatica on chronic steroids, recent hospitalization for aortitis, CHF, chronic leukocytosis, AAA with S/P stent graft in 2018, HLD, and DDD was admitted with an acute PE at [facility] ED and initially complaining of generalized weakness on 4/7. Patient developed SVT on the morning of 4/8 along with respiratory distress and hypoxia requiring non-re breather mask, found to have PE and DVT. Patient being managed by [facility], Cardiology, and Infectious Disease. PCCM consulted by ID to assist in managing the acute hypoxic respiratory failure.   Pertinent  Medical History   Past Medical History:  Diagnosis Date   AAA (abdominal aortic aneurysm)    Abnormal EKG    Left atrial abnormality   Allergic rhinitis    Allergy    Benign neoplasm of colon 04/14/10   3 small polyps on colonoscopy by Dr. Marina Goodell   Carpal tunnel syndrome    Cataract    Degenerative disc disease    Disturbances metabolism of methionine, homocystine, and cystathionine    Elevated homocysteine   Elevated prostate specific antigen (PSA)    Hearing loss    Hyperlipidemia    Impotence of organic origin    Penile implant   Internal hemorrhoids    Neck pain    Otosclerosis of both ears    Peripheral vascular disease    Bilateral femoral bruit   Plantar fasciitis    Polymyalgia rheumatica    Rotator cuff syndrome of left shoulder    Scoliosis    Shoulder pain      Significant Hospital Events: Including procedures, antibiotic start and stop dates in addition to other pertinent events   4/7 Admitted for Acute PE 4/8 Developed Rapid Atrial Flutter-HR 170s-180s , along with respiratory failure requiring NRB, PCCM consulted  4/12 feeling better, HHFNC 60%, 45L, steroids  tapered 4/15 PMT consult, on  salter HFNC 8-10L,  4/17 Off precedex since last evening, agitation seems to be improving  Interim History / Subjective:  DNR, off precedex, decreased HFNC requirement Had an episode of chest pain this morning, trop 130-128, EKG ordered  Objective   Blood pressure 139/74, pulse 88, temperature 98.4 F (36.9 C), temperature source Oral, resp. rate (!) 27, height 6' (1.829 m), weight 70.6 kg, SpO2 93 %.        Intake/Output Summary (Last 24 hours) at 03/15/2023 0732 Last data filed at 03/15/2023 0700 Gross per 24 hour  Intake 589.65 ml  Output 1800 ml  Net -1210.35 ml    Filed Weights   03/13/23 0400 03/14/23 0520 03/15/23 0500  Weight: 70.9 kg 71 kg 70.6 kg    General:  calm, elderly M resting in bed  HEENT: MM pink/moist, sclera anicteric Neuro: sleeping but easily arousable, following commands, oriented to person, disoriented to situation CV: s1s2 rrr, no r/g, soft systolic murmur  PULM:  non-labored on 8L HFNC, no significant wheezing or rhonchi GI: soft, non-tender Extremities: warm/dry, no edema  Skin: no rashes or lesions     Resolved Hospital Problem list   Hyponatremia  Assessment & Plan:   Acute Hypoxic Respiratory Failure in setting possibly of multifocal pna vs eosinophilic pna/ ILD/ autoimmune process   Acute PE with RLE DVT PMR on chronic prednisone, immunocompromised  Received 3 days  of high dose steroids, completed 4/10 Daptomycin stopped 4/7 - sed rate continues to down-trend to 70 this morning  P: - cont to wean salter HFNC as able, overall he is improving with steroids and antibiotics -Delirium/ anxiety improved and has been off precedex since last night  - pulmonary hygiene- IS, PT, OOB to chair > encourage - cont solumedrol  BID, will require a slow taper.  Baseline prednisone  daily - cont vanc/ zosyn, ID following - bactrim for PJP ppx - aspiration precautions - prn diuretics>remains net negative 8L  -  cont apixaban  - DNR/ DNI as of 4/15 PM.  Ongoing GOC, appreciate PMT assistance.   -stable for transfer out if ICU, Pulmonary  will continue to follow   Acute hyperactive delirium/ Anxiety - multifactorial with ICU LOS, hypoxia, steroids, sepsis, age.  - off precedex, continue  qHS, trazadone  q HS, and scheduled hydroxyzine - continue to avoid benzo's as able  - delirium precautions> will need to arrange down time/ naps during the day and ensure un-interrupted rest at night - supportive care as above   Paroxysmal atrial flutter/atrial fibrillation - EF normal EF with no wall motion abnormalities and diastolic parameters are norma - cont metoprolol 12.5mg  BID, unable to titrate up given softer BP/ HR on precedex at this time.   PRN lopressor 2.5mg  for sustained HR > 120 - cont apixaban - optimize electrolytes, K and Mag  Hyperlipidemia - Cont Crestor  BPH - Cont tamsulosin  AAA s/p aortic graft Possible aortitis - ABX per ID.  Dapto stopped 4/7 for concern of eosinophilic PNA.  Followed at [facility] ID - Currently on vancomycin and Zosyn per ID - remains afebrile.  Persistent leukocytosis could be related to steroids.   Prediabetes, steroid induced hyperglycemia  Patient hemoglobin A1c 6.3 - continue SSI prn, goal 140-180  At risk for malnutrition Deconditioning - maximize nutrition as able, Ensure TID - PT/ OT  GERD/ gastritis - exacerbated by steroids - continue PPI BID and pepcid with prn GI cocktail - aspiration precautions    Transient CP -trop flat at 130->128 -EKG without signs of ischemia  -cards consult, family request, last echo 4/8 LVEF 55-60%    Best Practice (right click and "Reselect all SmartList Selections" daily)   Diet/type: Regular consistency DVT prophylaxis: Apixaban GI prophylaxis: PPI Lines: N/A Foley:  N/A Code Status:  DNR Last date of multidisciplinary goals of care discussion [4/13: Patient, his wife and son   updated at beside, decision was to continue full scope of care].  He would like to go for ventilator if needed as last resort for short-term only.  PMT consulted 4/15.  Changed to DNR/ DNI per request of wife 4/15 PM per EMD.  Ongoing GOC/ discussions   Family update pending 4/17  CRITICAL CARE Performed by: Darcella Gasman Renatta Shrieves   Total critical care time: 35 minutes  Critical care time was exclusive of separately billable procedures and treating other patients.  Critical care was necessary to treat or prevent imminent or life-threatening deterioration.  Critical care was time spent personally by me on the following activities: development of treatment plan with patient and/or surrogate as well as nursing, discussions with consultants, evaluation of patient's response to treatment, examination of patient, obtaining history from patient or surrogate, ordering and performing treatments and interventions, ordering and review of laboratory studies, ordering and review of radiographic studies, pulse oximetry and re-evaluation of patient's condition.    Darcella Gasman Chief Walkup, PA-C [place]  Pulmonary & Critical care See Amion for pager If no response to pager , please call [phone] until 7pm After 7:00 pm call Elink  [phone]

## 2023-03-15 NOTE — Progress Notes (Signed)
Physical Therapy Treatment Patient Details Name: [name] MRN: [id number] DOB: 01-Aug-1930 Today's Date: 03/15/2023   History of Present Illness 89 yr old male  was admitted with an acute PE at [facility] ED and initially complaining of generalized weakness on 4/7. Patient developed SVT on the morning of 4/8 along with respiratory distress and hypoxia requiring non-re breather mask. EXB:[id number] rheumatica on chronic steroids, recent hospitalization for aortitis, CHF, chronic leukocytosis, AAA with S/P stent graft in 2018, HLD, and DDD    PT Comments    Pt asleep in reclined chair position on entry. Wife had just gotten to room and is eager to have pt work with therapy. Pt rouses with increased stimulation, and not eager to participate. Bed placed in egress position and pt able to come to unsupported sitting with min A and pt pulling on bed rails. Once pt feet on the floor attempted to assist pt into standing however pt reports nausea. After break, attempted again pt became agitated, yelling at wife and therapist. With wife's encouragement pt participates in seated exercise before asking to lay back.      Recommendations for follow up therapy are one component of a multi-disciplinary discharge planning process, led by the attending physician.  Recommendations may be updated based on patient status, additional functional criteria and insurance authorization.     Assistance Recommended at Discharge Frequent or constant Supervision/Assistance  Patient can return home with the following Assistance with cooking/housework;Direct supervision/assist for medications management;Direct supervision/assist for financial management;Assist for transportation;Help with stairs or ramp for entrance;A lot of help with walking and/or transfers;A lot of help with bathing/dressing/bathroom   Equipment Recommendations  Wheelchair cushion (measurements PT);Wheelchair (measurements PT);Hospital bed;BSC/3in1        Precautions / Restrictions Precautions Precautions: Fall Precaution Comments: watch sats and BP Restrictions Weight Bearing Restrictions: No     Mobility  Bed Mobility Overal bed mobility: Needs Assistance Bed Mobility: Supine to Sit, Sit to Supine     Supine to sit: Min assist     General bed mobility comments: attempted to use foot of bed egress to come to standing with min A able to come to unsupported sitting, with use of UE pulling on bedrails, maintains for short bouts of unsupported sitting throughout session requiring min A to achieve        Balance Overall balance assessment: Needs assistance Sitting-balance support: Feet supported, No upper extremity supported Sitting balance-Leahy Scale: Fair Sitting balance - Comments: EOB with tendency for right lean, very flexed cervical spine   Standing balance support: Bilateral upper extremity supported Standing balance-Leahy Scale: Poor Standing balance comment: bil UE support in standing                            Cognition Arousal/Alertness: Lethargic Behavior During Therapy: Agitated, Restless Overall Cognitive Status: Impaired/Different from baseline Area of Impairment: Safety/judgement, Problem solving                         Safety/Judgement: Decreased awareness of deficits   Problem Solving: Slow processing General Comments: yelled at PT when she placed her hands on pt to assist into upright sitting because hands were cold, agrivated about participating in therapy        Exercises General Exercises - Lower Extremity Ankle Circles/Pumps: AROM, 10 reps Long Arc Quad: AROM, Both, 10 reps, Seated, 5 reps, Left Hip ABduction/ADduction: AROM, Both, 5 reps, Seated Hip Flexion/Marching: AROM,  Both, 5 reps, Seated    General Comments General comments (skin integrity, edema, etc.): Wife in room providing maximal encouragement for pt to attempt standing, however pt starts to bark at her as  well as therapist about not wanting to try to stand, ultimately agreeable to seated exercise, VSS on 8L O2 via HFNC      Pertinent Vitals/Pain Pain Assessment Pain Assessment: Faces Faces Pain Scale: Hurts a little bit Pain Location: generalized with movement Pain Descriptors / Indicators: Grimacing, Guarding, Moaning Pain Intervention(s): Limited activity within patient's tolerance, Monitored during session, Repositioned     PT Goals (current goals can now be found in the care plan section) Acute Rehab PT Goals Patient Stated Goal: get back to work PT Goal Formulation: With patient/family Time For Goal Achievement: 03/21/23 Potential to Achieve Goals: Good Progress towards PT goals: Not progressing toward goals - comment    Frequency    Min 1X/week      PT Plan Current plan remains appropriate       AM-PAC PT "6 Clicks" Mobility   Outcome Measure  Help needed turning from your back to your side while in a flat bed without using bedrails?: A Little Help needed moving from lying on your back to sitting on the side of a flat bed without using bedrails?: A Little Help needed moving to and from a bed to a chair (including a wheelchair)?: Total Help needed standing up from a chair using your arms (e.g., wheelchair or bedside chair)?: Total Help needed to walk in hospital room?: Total Help needed climbing 3-5 steps with a railing? : Total 6 Click Score: 10    End of Session Equipment Utilized During Treatment: Gait belt;Oxygen Activity Tolerance: Patient limited by fatigue Patient left: in bed;with call bell/phone within reach;with family/visitor present Nurse Communication: Mobility status PT Visit Diagnosis: Difficulty in walking, not elsewhere classified (R26.2);Other abnormalities of gait and mobility (R26.89);Unsteadiness on feet (R26.81)     Time: 1610-9604 PT Time Calculation (min) (ACUTE ONLY): 44 min  Charges:  $Therapeutic Exercise: 8-22 mins $Therapeutic  Activity: 23-37 mins                     Sandia Pfund B. Beverely Risen PT, DPT Acute Rehabilitation Services Please use secure chat or  Call Office [phone]    Elon Alas [facility] 03/15/2023, 12:50 PM

## 2023-03-16 DIAGNOSIS — J9601 Acute respiratory failure with hypoxia: Secondary | ICD-10-CM | POA: Diagnosis not present

## 2023-03-16 DIAGNOSIS — I484 Atypical atrial flutter: Secondary | ICD-10-CM | POA: Diagnosis not present

## 2023-03-16 DIAGNOSIS — I4892 Unspecified atrial flutter: Secondary | ICD-10-CM | POA: Diagnosis not present

## 2023-03-16 DIAGNOSIS — J8282 Acute eosinophilic pneumonia: Secondary | ICD-10-CM | POA: Diagnosis not present

## 2023-03-16 DIAGNOSIS — R531 Weakness: Secondary | ICD-10-CM | POA: Diagnosis not present

## 2023-03-16 DIAGNOSIS — G9341 Metabolic encephalopathy: Secondary | ICD-10-CM | POA: Diagnosis not present

## 2023-03-16 DIAGNOSIS — R410 Disorientation, unspecified: Secondary | ICD-10-CM | POA: Diagnosis not present

## 2023-03-16 DIAGNOSIS — I714 Abdominal aortic aneurysm, without rupture, unspecified: Secondary | ICD-10-CM | POA: Diagnosis not present

## 2023-03-16 DIAGNOSIS — I2699 Other pulmonary embolism without acute cor pulmonale: Secondary | ICD-10-CM | POA: Diagnosis not present

## 2023-03-16 LAB — CBC
HCT: 31.1 % — ABNORMAL LOW (ref 39.0–52.0)
Hemoglobin: 10.5 g/dL — ABNORMAL LOW (ref 13.0–17.0)
MCH: 31.7 pg (ref 26.0–34.0)
MCHC: 33.8 g/dL (ref 30.0–36.0)
MCV: 94 fL (ref 80.0–100.0)
Platelets: 379 10*3/uL (ref 150–400)
RBC: 3.31 MIL/uL — ABNORMAL LOW (ref 4.22–5.81)
RDW: 16.4 % — ABNORMAL HIGH (ref 11.5–15.5)
WBC: 29.8 10*3/uL — ABNORMAL HIGH (ref 4.0–10.5)
nRBC: 0 % (ref 0.0–0.2)

## 2023-03-16 LAB — BASIC METABOLIC PANEL
Anion gap: 10 (ref 5–15)
BUN: 35 mg/dL — ABNORMAL HIGH (ref 8–23)
CO2: 24 mmol/L (ref 22–32)
Calcium: 8 mg/dL — ABNORMAL LOW (ref 8.9–10.3)
Chloride: 105 mmol/L (ref 98–111)
Creatinine, Ser: 0.92 mg/dL (ref 0.61–1.24)
GFR, Estimated: >60 mL/min (ref >60)
Glucose, Bld: 154 mg/dL — ABNORMAL HIGH (ref 70–99)
Potassium: 4.1 mmol/L (ref 3.5–5.1)
Sodium: 139 mmol/L (ref 135–145)

## 2023-03-16 LAB — GLUCOSE, CAPILLARY
Glucose-Capillary: 133 mg/dL — ABNORMAL HIGH (ref 70–99)
Glucose-Capillary: 156 mg/dL — ABNORMAL HIGH (ref 70–99)
Glucose-Capillary: 125 mg/dL — ABNORMAL HIGH (ref 70–99)
Glucose-Capillary: 105 mg/dL — ABNORMAL HIGH (ref 70–99)

## 2023-03-16 NOTE — Progress Notes (Signed)
[facility]                                                                               [name], is a 89 y.o. male, DOB - October 01, 1930, ZOX:[id number] Admit date - 03/05/2023    Outpatient Primary MD for the patient is [name], MD  LOS - 10  days    Brief summary   89 yr old male with significant pmhx of polymyalgia rheumatica on chronic steroids, recent hospitalization for aortitis, CHF, chronic leukocytosis, AAA with S/P stent graft in 2018, HLD, and DDD was admitted with an acute PE at [facility] ED and initially complaining of generalized weakness on 4/7. Patient with worsening respiratory distress and hypoxia on 4/8/ requiring non-re breather mask, found to have PE and DVT. Patient being managed by [facility], Cardiology, and Infectious Disease. PCCM consulted by ID to assist in managing the acute hypoxic respiratory failure.      Assessment & Plan    Assessment and Plan:  Acute hypoxic respiratory failure in the setting of multifocal pneumonia versus eosinophilic pneumonia from daptomycin versus interstitial lung disease/autoimmune process. Acute pulmonary embolism with right lower extremity DVT in the setting of PMR on chronic steroids, immunocompromise Daptomycin was stopped on 4/7. He was started on high-dose steroids and currently on 40 mg of Solu-Medrol every 12 hours Initially required high flow nasal cannula oxygen currently on 4 L of nasal cannula oxygen. ID on board recommended to continue with IV ceftriaxone, oral doxycycline, Bactrim for PJP prophylaxis.   Paroxysmal atrial flutter/fibrillation Rate controlled with metoprolol 12.5 mg twice daily and on Eliquis for anticoagulation. Echo shows left ventricular ejection fraction of 55 to 60% with no regional wall abnormalities.  Left ventricular diastolic parameters are normal   Pulmonary embolism and right lower extremity DVT On Eliquis for anticoagulation.  Chest pain yesterday, which appears to hae  resolved.  Elevated troponins. Demand ischemia from respiratory illness? Cardiology consulted by PCCM.  Currently chest pain free.   AAA s/p sciatic graft and possible aortitis Patient was on daptomycin per ID at [facility] which was discontinued for concern for of eosinophilic pneumonia Patient currently on IV ceftriaxone, doxycycline and Bactrim   Acute delirium/anxiety Multifactorial possibly hospital delirium versus from hypoxia versus from sepsis versus from steroids Initially required Precedex gtt, has been weaned off and currently on Seroquel, Atarax and trazodone.    Hyperlipidemia:  On statin/ Crestor 5 mg daily. .    PMR:  Pt is on 5 mg of prednisone at home.   BPH   Anemia of chronic disease:  Baseline hemoglobin around 11. Currently at 10.5  continue to monitor.    Chronic leukocytosis:  Recommend outpatient follow up with hematology.    GERD: On PPI/ Pepcid. Zofran.    RN Pressure Injury Documentation: Pressure Injury 03/12/23 Sacrum Mid Stage 1 -  Intact skin with non-blanchable redness of a localized area usually over a bony prominence. Pink non blanching on sacrum (Active)  03/12/23 0800  Location: Sacrum  Location Orientation: Mid  Staging: Stage 1 -  Intact skin with non-blanchable redness of a localized area usually over a bony prominence.  Wound Description (Comments): Pink non  blanching on sacrum  Present on Admission: No  Dressing Type Foam - Lift dressing to assess site every shift 03/15/23 2000  Foam dressing in place.   Estimated body mass index is 21.05 kg/m as calculated from the following:   Height as of this encounter: 6' (1.829 m).   Weight as of this encounter: 70.4 kg.  Code Status: DNR DVT Prophylaxis:   apixaban (ELIQUIS) tablet 5 mg   Level of Care: Level of care: Telemetry Cardiac Family Communication: NONE at bedside.   Disposition Plan:     Remains inpatient appropriate: IV ceftriaxone.   Procedures:  Echocardiogram.    Consultants:   ID PALLIATIVE CARE PCCM  Significant events  4/7 Admitted for Acute PE 4/8 Developed Rapid Atrial Flutter-HR 170s-180s , along with respiratory failure requiring NRB, PCCM consulted  4/12 feeling better, HHFNC 60%, 45L, steroids tapered 4/15 PMT consult, on  salter HFNC 8-10L,  4/17 Off precedex since last evening, agitation seems to be improving 4/18 transfer to [facility] from PCCM.   Antimicrobials:   Anti-infectives (From admission, onward)    Start     Dose/Rate Route Frequency Ordered Stop   03/15/23 2200  doxycycline (VIBRA-TABS) tablet 100 mg        100 mg Oral Every 12 hours 03/15/23 1109 03/23/23 2359   03/15/23 1400  cefTRIAXone (ROCEPHIN) 2 g in sodium chloride 0.9 % 100 mL IVPB        2 g 200 mL/hr over 30 Minutes Intravenous Daily 03/15/23 1109 03/23/23 2359   03/13/23 2200  vancomycin (VANCOREADY) IVPB 750 mg/150 mL  Status:  Discontinued        750 mg 150 mL/hr over 60 Minutes Intravenous Every 12 hours 03/13/23 1131 03/15/23 1109   03/10/23 2300  vancomycin (VANCOREADY) IVPB 1250 mg/250 mL  Status:  Discontinued        1,250 mg 166.7 mL/hr over 90 Minutes Intravenous Every 18 hours 03/10/23 2243 03/13/23 1131   03/10/23 2000  piperacillin-tazobactam (ZOSYN) IVPB 3.375 g  Status:  Discontinued        3.375 g 12.5 mL/hr over 240 Minutes Intravenous Every 8 hours 03/10/23 1140 03/15/23 1109   03/06/23 1715  vancomycin (VANCOREADY) IVPB 1500 mg/300 mL  Status:  Discontinued        1,500 mg 150 mL/hr over 120 Minutes Intravenous  Once 03/06/23 1628 03/06/23 1628   03/06/23 1628  vancomycin (VANCOREADY) IVPB 1250 mg/250 mL  Status:  Discontinued        1,250 mg 166.7 mL/hr over 90 Minutes Intravenous Every 24 hours 03/06/23 1628 03/10/23 2243   03/06/23 0900  sulfamethoxazole-trimethoprim (BACTRIM DS) 800-160 MG per tablet 1 tablet        1 tablet Oral Once per day on Mon Wed Fri 03/05/23 2141     03/06/23 0800  ceFEPIme (MAXIPIME) 2 g in sodium chloride  0.9 % 100 mL IVPB  Status:  Discontinued        2 g 200 mL/hr over 30 Minutes Intravenous Every 12 hours 03/05/23 2140 03/10/23 1140   03/05/23 1845  DAPTOmycin (CUBICIN) 600 mg in sodium chloride 0.9 % IVPB  Status:  Discontinued        600 mg 124 mL/hr over 30 Minutes Intravenous Daily 03/05/23 1833 03/06/23 1546   03/05/23 1645  ceFEPIme (MAXIPIME) 2 g in sodium chloride 0.9 % 100 mL IVPB        2 g 200 mL/hr over 30 Minutes Intravenous  Once 03/05/23 1639 03/05/23 2018  03/05/23 1645  vancomycin (VANCOREADY) IVPB 1500 mg/300 mL  Status:  Discontinued        1,500 mg 150 mL/hr over 120 Minutes Intravenous  Once 03/05/23 1639 03/05/23 1645        Medications  Scheduled Meds:  alteplase  2 mg Intracatheter Once   apixaban  5 mg Oral BID   aspirin  81 mg Oral Daily   Chlorhexidine Gluconate Cloth  6 each Topical Daily   doxycycline  100 mg Oral Q12H   famotidine  20 mg Oral Daily   feeding supplement  237 mL Oral TID BM   fluorometholone  1 drop Right Eye QHS   folic acid  1 mg Oral Daily   hydrOXYzine  25 mg Oral Q6H   insulin aspart  0-15 Units Subcutaneous TID WC   melatonin  3 mg Oral QHS   methylPREDNISolone (SOLU-MEDROL) injection  40 mg Intravenous Q12H   metoprolol tartrate  12.5 mg Oral BID   pantoprazole  40 mg Oral BID   QUEtiapine  12.5 mg Oral Daily   QUEtiapine  50 mg Oral QHS   rosuvastatin  5 mg Oral QHS   senna-docusate  1 tablet Oral BID   sodium chloride flush  10-40 mL Intracatheter Q12H   sulfamethoxazole-trimethoprim  1 tablet Oral Once per day on Mon Wed Fri   traZODone  200 mg Oral QHS   Continuous Infusions:  sodium chloride Stopped (03/10/23 1127)   cefTRIAXone (ROCEPHIN)  IV Stopped (03/15/23 1404)   PRN Meds:.sodium chloride, acetaminophen **OR** acetaminophen, alum & mag hydroxide-simeth, dicyclomine, hydrALAZINE, lidocaine, menthol-cetylpyridinium, ondansetron (ZOFRAN) IV, mouth rinse, sodium chloride flush    Subjective:   [name] was seen and examined today.  No new complaints.  Objective:   Vitals:   03/16/23 0600 03/16/23 0715 03/16/23 0800 03/16/23 0900  BP: (!) 156/95  131/74 120/78  Pulse: 92 87 88 89  Resp: Temp:      TempSrc:      SpO2: 99% 93% 94% 93%  Weight:      Height:        Intake/Output Summary (Last 24 hours) at 03/16/2023 0928 Last data filed at 03/16/2023 0800 Gross per 24 hour  Intake 239.62 ml  Output 850 ml  Net -610.38 ml   Filed Weights   03/14/23 0520 03/15/23 0500 03/16/23 0500  Weight: 71 kg 70.6 kg 70.4 kg     Exam General exam: Ill appearing gentleman, not in distress.  Respiratory system: diminished air entry at bases, on 4 lit of  oxygen.  Cardiovascular system: S1 & S2 heard, RRR. No JVD, Gastrointestinal system: Abdomen is nondistended, soft and nontender.  Central nervous system: lethargic, but opening eyes to verbal cues.  confused, able to answer questions with yes and no answer.  Extremities: no pedal edema.  Skin: No rashes,  Psychiatry: lethargic and confused.    Data Reviewed:  I have personally reviewed following labs and imaging studies   CBC Lab Results  Component Value Date   WBC 29.8 (H) 03/16/2023   RBC 3.31 (L) 03/16/2023   HGB 10.5 (L) 03/16/2023   HCT 31.1 (L) 03/16/2023   MCV 94.0 03/16/2023   MCH 31.7 03/16/2023   PLT 379 03/16/2023   MCHC 33.8 03/16/2023   RDW 16.4 (H) 03/16/2023   LYMPHSABS 0.8 03/12/2023   MONOABS 0.5 03/12/2023   EOSABS 0.1 03/12/2023   BASOSABS 0.1 03/12/2023     Last metabolic panel Lab  Results  Component Value Date   NA 139 03/16/2023   K 4.1 03/16/2023   CL 105 03/16/2023   CO2 24 03/16/2023   BUN 35 (H) 03/16/2023   CREATININE 0.92 03/16/2023   GLUCOSE 154 (H) 03/16/2023   GFRNONAA >60 03/16/2023   GFRAA >60 01/24/2018   CALCIUM 8.0 (L) 03/16/2023   PHOS 3.3 03/14/2023   PROT 5.6 (L) 03/11/2023   ALBUMIN 2.2 (L) 03/14/2023   LABGLOB 2.5 03/21/2016   AGRATIO 1.6  03/21/2016   BILITOT 0.8 03/11/2023   ALKPHOS 78 03/11/2023   AST 23 03/11/2023   ALT 39 03/11/2023   ANIONGAP 10 03/16/2023    CBG (last 3)  Recent Labs    03/15/23 1120 03/15/23 1545 03/16/23 0657  GLUCAP 131* 161* 133*      Coagulation Profile: No results for input(s): "INR", "PROTIME" in the last 168 hours.   Radiology Studies: No results found.     Kathlen Mody M.D. [facility] 03/16/2023, 9:28 AM  Available via Epic secure chat 7am-7pm After 7 pm, please refer to night coverage provider listed on amion.

## 2023-03-16 NOTE — Progress Notes (Signed)
Occupational Therapy Treatment Patient Details Name: [name] MRN: [id number] DOB: 06-25-1930 Today's Date: 03/16/2023   History of present illness 89 yr old male  was admitted with an acute PE at [facility] ED and initially complaining of generalized weakness on 4/7. Patient developed SVT on the morning of 4/8 along with respiratory distress and hypoxia requiring non-re breather mask. [id number]:[id number] rheumatica on chronic steroids, recent hospitalization for aortitis, CHF, chronic leukocytosis, AAA with S/P stent graft in 2018, HLD, and DDD   OT comments  With encouragement, pt agreed to sit EOB with OT. Pt with complaints of shortness of breath with exertion, educated pt on importance of gradual mobility to increase activity tolerance. Pt only needing min assist to EOB and able to sit with supervision. Pt became more anxious with sitting >2 minutes. SpO2 to 78% with good waveform on 4L O2, returned to supine with HOB and performed pursed lip breathing to assist in recovery back to low 90s. Educated wife in methods to reduce delirium.    Recommendations for follow up therapy are one component of a multi-disciplinary discharge planning process, led by the attending physician.  Recommendations may be updated based on patient status, additional functional criteria and insurance authorization.    Assistance Recommended at Discharge Frequent or constant Supervision/Assistance  Patient can return home with the following  Help with stairs or ramp for entrance;Assist for transportation;Assistance with cooking/housework;Direct supervision/assist for financial management;Direct supervision/assist for medications management;A lot of help with bathing/dressing/bathroom;A lot of help with walking and/or transfers   Equipment Recommendations  BSC/3in1    Recommendations for Other Services      Precautions / Restrictions Precautions Precautions: Fall Precaution Comments: watch sats and  BP Restrictions Weight Bearing Restrictions: No       Mobility Bed Mobility   Bed Mobility: Supine to Sit, Sit to Supine     Supine to sit: Min assist Sit to supine: Min guard   General bed mobility comments: pt able to advance LEs to EOB, min assist to raise trunk and bring R hip for feet to be on the floor, pt able to return to supine on his own    Transfers                   General transfer comment: pt declined     Balance Overall balance assessment: Needs assistance   Sitting balance-Leahy Scale: Fair                                     ADL either performed or assessed with clinical judgement   ADL                                              Extremity/Trunk Assessment              Vision       Perception     Praxis      Cognition Arousal/Alertness: Awake/alert Behavior During Therapy: Flat affect, Agitated Overall Cognitive Status: Impaired/Different from baseline Area of Impairment: Problem solving                             Problem Solving: Slow processing General Comments: conversation with pt about necessary mobilizing needed for him to  return home        Exercises      Shoulder Instructions       General Comments      Pertinent Vitals/ Pain       Pain Assessment Pain Assessment: Faces Faces Pain Scale: Hurts little more Pain Location: chest with shortness of breath Pain Descriptors / Indicators: Grimacing, Guarding, Sore Pain Intervention(s): Monitored during session, Repositioned  Home Living                                          Prior Functioning/Environment              Frequency  Min 1X/week        Progress Toward Goals  OT Goals(current goals can now be found in the care plan section)  Progress towards OT goals: Not progressing toward goals - comment  Acute Rehab OT Goals OT Goal Formulation: With patient Time For Goal  Achievement: 03/21/23 Potential to Achieve Goals: Fair  Plan Discharge plan remains appropriate    Co-evaluation                 AM-PAC OT "6 Clicks" Daily Activity     Outcome Measure   Help from another person eating meals?: None Help from another person taking care of personal grooming?: A Little Help from another person toileting, which includes using toliet, bedpan, or urinal?: Total Help from another person bathing (including washing, rinsing, drying)?: A Lot Help from another person to put on and taking off regular upper body clothing?: A Little Help from another person to put on and taking off regular lower body clothing?: Total 6 Click Score: 14    End of Session Equipment Utilized During Treatment: Oxygen (4L)  OT Visit Diagnosis: Unsteadiness on feet (R26.81);Muscle weakness (generalized) (M62.81)   Activity Tolerance Patient limited by fatigue   Patient Left in bed;with call bell/phone within reach;with family/visitor present   Nurse Communication Mobility status        Time: 1610-9604 OT Time Calculation (min): 16 min  Charges: OT General Charges $OT Visit: 1 Visit OT Treatments $Therapeutic Activity: 8-22 mins  Berna Spare, OTR/L Acute Rehabilitation Services Office: [phone]   Evern Bio 03/16/2023, 2:28 PM

## 2023-03-16 NOTE — Progress Notes (Signed)
Subjective:  He was asleep when we came by  Antibiotics:  Anti-infectives (From admission, onward)    Start     Dose/Rate Route Frequency Ordered Stop   03/15/23 2200  doxycycline (VIBRA-TABS) tablet 100 mg        100 mg Oral Every 12 hours 03/15/23 1109 03/23/23 2359   03/15/23 1400  cefTRIAXone (ROCEPHIN) 2 g in sodium chloride 0.9 % 100 mL IVPB        2 g 200 mL/hr over 30 Minutes Intravenous Daily 03/15/23 1109 03/23/23 2359   03/13/23 2200  vancomycin (VANCOREADY) IVPB 750 mg/150 mL  Status:  Discontinued        750 mg 150 mL/hr over 60 Minutes Intravenous Every 12 hours 03/13/23 1131 03/15/23 1109   03/10/23 2300  vancomycin (VANCOREADY) IVPB 1250 mg/250 mL  Status:  Discontinued        1,250 mg 166.7 mL/hr over 90 Minutes Intravenous Every 18 hours 03/10/23 2243 03/13/23 1131   03/10/23 2000  piperacillin-tazobactam (ZOSYN) IVPB 3.375 g  Status:  Discontinued        3.375 g 12.5 mL/hr over 240 Minutes Intravenous Every 8 hours 03/10/23 1140 03/15/23 1109   03/06/23 1715  vancomycin (VANCOREADY) IVPB 1500 mg/300 mL  Status:  Discontinued        1,500 mg 150 mL/hr over 120 Minutes Intravenous  Once 03/06/23 1628 03/06/23 1628   03/06/23 1628  vancomycin (VANCOREADY) IVPB 1250 mg/250 mL  Status:  Discontinued        1,250 mg 166.7 mL/hr over 90 Minutes Intravenous Every 24 hours 03/06/23 1628 03/10/23 2243   03/06/23 0900  sulfamethoxazole-trimethoprim (BACTRIM DS) 800-160 MG per tablet 1 tablet        1 tablet Oral Once per day on Mon Wed Fri 03/05/23 2141     03/06/23 0800  ceFEPIme (MAXIPIME) 2 g in sodium chloride 0.9 % 100 mL IVPB  Status:  Discontinued        2 g 200 mL/hr over 30 Minutes Intravenous Every 12 hours 03/05/23 2140 03/10/23 1140   03/05/23 1845  DAPTOmycin (CUBICIN) 600 mg in sodium chloride 0.9 % IVPB  Status:  Discontinued        600 mg 124 mL/hr over 30 Minutes Intravenous Daily 03/05/23 1833 03/06/23 1546   03/05/23 1645  ceFEPIme  (MAXIPIME) 2 g in sodium chloride 0.9 % 100 mL IVPB        2 g 200 mL/hr over 30 Minutes Intravenous  Once 03/05/23 1639 03/05/23 2018   03/05/23 1645  vancomycin (VANCOREADY) IVPB 1500 mg/300 mL  Status:  Discontinued        1,500 mg 150 mL/hr over 120 Minutes Intravenous  Once 03/05/23 1639 03/05/23 1645       Medications: Scheduled Meds:  alteplase  2 mg Intracatheter Once   apixaban  5 mg Oral BID   aspirin  81 mg Oral Daily   Chlorhexidine Gluconate Cloth  6 each Topical Daily   doxycycline  100 mg Oral Q12H   famotidine  20 mg Oral Daily   feeding supplement  237 mL Oral TID BM   fluorometholone  1 drop Right Eye QHS   folic acid  1 mg Oral Daily   hydrOXYzine  25 mg Oral Q6H   insulin aspart  0-15 Units Subcutaneous TID WC   melatonin  3 mg Oral QHS   methylPREDNISolone (SOLU-MEDROL) injection  40 mg Intravenous Q12H   metoprolol tartrate  12.5 mg Oral BID   pantoprazole  40 mg Oral BID   QUEtiapine  12.5 mg Oral Daily   QUEtiapine  50 mg Oral QHS   rosuvastatin  5 mg Oral QHS   senna-docusate  1 tablet Oral BID   sodium chloride flush  10-40 mL Intracatheter Q12H   sulfamethoxazole-trimethoprim  1 tablet Oral Once per day on Mon Wed Fri   traZODone  200 mg Oral QHS   Continuous Infusions:  sodium chloride Stopped (03/10/23 1127)   cefTRIAXone (ROCEPHIN)  IV Stopped (03/16/23 1004)   PRN Meds:.sodium chloride, acetaminophen **OR** acetaminophen, alum & mag hydroxide-simeth, dicyclomine, hydrALAZINE, lidocaine, menthol-cetylpyridinium, ondansetron (ZOFRAN) IV, mouth rinse, sodium chloride flush    Objective: Weight change: -0.2 kg  Intake/Output Summary (Last 24 hours) at 03/16/2023 1211 Last data filed at 03/16/2023 1100 Gross per 24 hour  Intake 320.07 ml  Output 850 ml  Net -529.93 ml    Blood pressure 126/70, pulse 81, temperature 98.6 F (37 C), temperature source Axillary, resp. rate 20, height 6' (1.829 m), weight 70.4 kg, SpO2 96 %. Temp:  [98 F  (36.7 C)-98.6 F (37 C)] 98.6 F (37 C) (04/18 1100) Pulse Rate:  [77-101] 81 (04/18 1100) Resp:  [17-26] 20 (04/18 1100) BP: (109-156)/(61-95) 126/70 (04/18 1100) SpO2:  [93 %-100 %] 96 % (04/18 1100) Weight:  [70.4 kg] 70.4 kg (04/18 0500)  Physical Exam: Physical Exam HENT:     Head: Normocephalic and atraumatic.     Nose: Nose normal.  Cardiovascular:     Rate and Rhythm: Tachycardia present.  Pulmonary:     Effort: No respiratory distress.  Abdominal:     General: Abdomen is flat.     Palpations: Abdomen is soft.  Skin:    General: Skin is warm and dry.  Neurological:     Mental Status: He is disoriented.   He was sleeping when we came by this am and we did not try to wake him  CBC:    BMET Recent Labs    03/15/23 0402 03/16/23 0435  NA 139 139  K 3.9 4.1  CL 105 105  CO2 23 24  GLUCOSE 162* 154*  BUN 33* 35*  CREATININE 1.10 0.92  CALCIUM 8.2* 8.0*      Liver Panel  Recent Labs    03/14/23 0417  ALBUMIN 2.2*        Sedimentation Rate Recent Labs    03/15/23 0402  ESRSEDRATE 70*    C-Reactive Protein No results for input(s): "CRP" in the last 72 hours.   Micro Results: Recent Results (from the past 720 hour(s))  Culture, blood (routine x 2)     Status: None   Collection Time: 03/05/23  4:14 PM   Specimen: BLOOD  Result Value Ref Range Status   Specimen Description BLOOD SITE NOT SPECIFIED  Final   Special Requests   Final    BOTTLES DRAWN AEROBIC AND ANAEROBIC Blood Culture adequate volume   Culture   Final    NO GROWTH 5 DAYS Performed at [facility], [address], [place], [place] [address]    Report Status 03/10/2023 FINAL  Final  Resp panel by RT-PCR (RSV, Flu A&B, Covid) Anterior Nasal Swab     Status: None   Collection Time: 03/05/23  4:29 PM   Specimen: Anterior Nasal Swab  Result Value Ref Range Status   SARS Coronavirus 2 by RT PCR NEGATIVE NEGATIVE Final   Influenza A by PCR NEGATIVE NEGATIVE  Final    Influenza B by PCR NEGATIVE NEGATIVE Final    Comment: (NOTE) The Xpert Xpress SARS-CoV-2/FLU/RSV plus assay is intended as an aid in the diagnosis of influenza from Nasopharyngeal swab specimens and should not be used as a sole basis for treatment. Nasal washings and aspirates are unacceptable for Xpert Xpress SARS-CoV-2/FLU/RSV testing.  Fact Sheet for Patients: BloggerCourse.com  Fact Sheet for Healthcare Providers: SeriousBroker.it  This test is not yet approved or cleared by the [place] FDA and has been authorized for detection and/or diagnosis of SARS-CoV-2 by FDA under an Emergency Use Authorization (EUA). This EUA will remain in effect (meaning this test can be used) for the duration of the COVID-19 declaration under Section 564(b)(1) of the Act, 21 U.S.C. section 360bbb-3(b)(1), unless the authorization is terminated or revoked.     Resp Syncytial Virus by PCR NEGATIVE NEGATIVE Final    Comment: (NOTE) Fact Sheet for Patients: BloggerCourse.com  Fact Sheet for Healthcare Providers: SeriousBroker.it  This test is not yet approved or cleared by the [place] FDA and has been authorized for detection and/or diagnosis of SARS-CoV-2 by FDA under an Emergency Use Authorization (EUA). This EUA will remain in effect (meaning this test can be used) for the duration of the COVID-19 declaration under Section 564(b)(1) of the Act, 21 U.S.C. section 360bbb-3(b)(1), unless the authorization is terminated or revoked.  Performed at [facility], [address] [place], [place] [address]   Culture, blood (routine x 2)     Status: None   Collection Time: 03/05/23  4:33 PM   Specimen: BLOOD LEFT FOREARM  Result Value Ref Range Status   Specimen Description BLOOD LEFT FOREARM  Final   Special Requests   Final    BOTTLES DRAWN AEROBIC AND ANAEROBIC Blood Culture  results may not be optimal due to an excessive volume of blood received in culture bottles   Culture   Final    NO GROWTH 5 DAYS Performed at [facility], [address], [place], [place] [address]    Report Status 03/10/2023 FINAL  Final  MRSA Next Gen by PCR, Nasal     Status: None   Collection Time: 03/06/23  8:34 PM   Specimen: Nasal Mucosa; Nasal Swab  Result Value Ref Range Status   MRSA by PCR Next Gen NOT DETECTED NOT DETECTED Final    Comment: (NOTE) The GeneXpert MRSA Assay (FDA approved for NASAL specimens only), is one component of a comprehensive MRSA colonization surveillance program. It is not intended to diagnose MRSA infection nor to guide or monitor treatment for MRSA infections. Test performance is not FDA approved in patients less than 63 years old. Performed at [facility], [address] [place], [place] [address]     Studies/Results: No results found.    Assessment/Plan:  INTERVAL HISTORY:   Still off precedex but anxiety, delrium are still an issue and he is refusing food and irritable  Principal Problem:   Acute pulmonary embolism Active Problems:   Hyperlipidemia   PMR (polymyalgia rheumatica)   AAA (abdominal aortic aneurysm) without rupture (HCC)   Generalized weakness   Acute respiratory failure with hypoxia   Acute hyponatremia   Elevated troponin   Lactic acidosis   HCAP (healthcare-associated pneumonia)   Chronic diastolic CHF (congestive heart failure)   BPH (benign prostatic hyperplasia)   Pulmonary embolism   Eosinophilic pneumonia   Aortitis   Atrial flutter   FUO (fever of unknown origin)  S/P insertion of endovascular thoracic aortic stent graft   Drug-induced insomnia   Anxiety   DVT, lower extremity, distal, acute, right   PAF (paroxysmal atrial fibrillation)   Acute metabolic encephalopathy   Paroxysmal A-fib   Pressure injury of skin   Delirium   Acute eosinophilic pneumonia    [name] is  a 89 y.o. male with complicated medical history including aortic aneurysm status post placement of stent graft, polymyalgia rheumatica that has been well-controlled on prednisone who has been experiencing malaise for nearly 8 months with intermittent fevers and elevated inflammatory markers, who was worked up for this condition by infectious disease at [facility] including PET scan that showed increased uptake in the aorta near stent graft with question of aortitis.  The tagged white cell scan subsequently did not show uptake in this area or anywhere else in the body.  I reviewed the case with Dr.Julia Dolores Frame yesterday over the phone and she stated that they had reviewed the PET scan again with radiology and that radiology is not entirely convinced of the uptake being particularly dramatic.  Regardless the patient was the begun on empiric antibiotic regimen for possible bacterial infectious aortitis with daptomycin and ceftriaxone.  The plan was to see how the patient did on this including monitoring his inflammatory markers.  All along however there has been suspicion that there might be an autoimmune process involved and the patient is followed closely by Dr. Alberteen Spindle with nephrology at [facility].  He has now been admitted to [facility] with an acute pulmonary embolism and a CT angiogram that also shows multifocal infiltrates.  He is experienced progressive hypoxemic respiratory failure requiring high flow oxygen currently on high flow oxygen via nasal cannula.  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  His antibiotics switched to vancomycin and cefepime having been moved from daptomycin ceftriaxone disease.  CCM now gotten rid of cefpime in favor of zosyn due to concerns for CNS side effects of cefepime/  He improved from an  oxygenation stand point over weekend on lower dose steroids with diuresis and he is with less anxiety though his delirium is still an issue with being on precedex though now sp seroquel and small dose ativan  Troponins have risen now  #1  Pulmonary embolism with heart strain and DVT identified in the right lower extremity  On anticoagulation  #2 Pneumonia: favor eosinophilic PNA vs perhaps more likely declaration of autoimmune pulmonary pathology  His steroids have been dropped but he has actually improved from O2 standpoint after diuresis  He has had MORE than sufficient coverage for MRSA, pseudomonas in lungs and we put it back to ceftriaxone and oral doxycycline to treat the aortitis see below  He is on BACTRIM for PCP prophylaxis  Fungitell was negative    #3 ? Aortitis: his original stop date for his empiric antibiotics was 425  As above we are changing to ceftriaxone and doxycycline. If he nears DC would be completely OK with change to purely oral regimen  #4  Suspected underlying autoimmune disease potentially with pulmonary complications and patient with known polymyalgia rheumatica:  He is on corticosteroids  ESR is down fwiw  # + troponins: does have PE but ? Demand ischemia, coronary event   #5 Delirium: the biggest obstacle for him is this and anxiety  #6 Goals of care:  Patient's wife and son have been meeting with Palliative care  I have personally spent 52 minutes involved in face-to-face and non-face-to-face activities for this patient on the day of the visit. Professional time spent includes the following activities: Preparing to see the patient (review of tests), Obtaining and/or reviewing separately obtained history (admission/discharge record), Performing a medically appropriate examination and/or evaluation , Ordering medications/tests/procedures, referring and communicating with other health care professionals, Documenting clinical information in the EMR,  Independently interpreting results (not separately reported), Communicating results to the patient/family/caregiver, Counseling and educating the patient/family/caregiver and Care coordination (not separately reported).        LOS: 10 days   Acey Lav 03/16/2023, 12:11 PM

## 2023-03-16 NOTE — Progress Notes (Signed)
NAME:  [name], MRN:  [id number], DOB:  [birth year], LOS: 10 ADMISSION DATE:  03/05/2023, CONSULTATION DATE: 4/8  REFERRING MD:  Daiva Eves CHIEF COMPLAINT:  Generalized Weakness   History of Present Illness:  89 yr old male with significant pmhx of polymyalgia rheumatica on chronic steroids, recent hospitalization for aortitis, CHF, chronic leukocytosis, AAA with S/P stent graft in 2018, HLD, and DDD was admitted with an acute PE at [facility] ED and initially complaining of generalized weakness on 4/7. Patient developed SVT on the morning of 4/8 along with respiratory distress and hypoxia requiring non-re breather mask, found to have PE and DVT. Patient being managed by [facility], Cardiology, and Infectious Disease. PCCM consulted by ID to assist in managing the acute hypoxic respiratory failure.   Pertinent  Medical History   Past Medical History:  Diagnosis Date   AAA (abdominal aortic aneurysm)    Abnormal EKG    Left atrial abnormality   Allergic rhinitis    Allergy    Benign neoplasm of colon 04/14/10   3 small polyps on colonoscopy by Dr. Marina Goodell   Carpal tunnel syndrome    Cataract    Degenerative disc disease    Disturbances metabolism of methionine, homocystine, and cystathionine    Elevated homocysteine   Elevated prostate specific antigen (PSA)    Hearing loss    Hyperlipidemia    Impotence of organic origin    Penile implant   Internal hemorrhoids    Neck pain    Otosclerosis of both ears    Peripheral vascular disease    Bilateral femoral bruit   Plantar fasciitis    Polymyalgia rheumatica    Rotator cuff syndrome of left shoulder    Scoliosis    Shoulder pain      Significant Hospital Events: Including procedures, antibiotic start and stop dates in addition to other pertinent events   4/7 Admitted for Acute PE 4/8 Developed Rapid Atrial Flutter-HR 170s-180s , along with respiratory failure requiring NRB, PCCM consulted  4/12 feeling better, HHFNC 60%, 45L, steroids  tapered 4/15 PMT consult, on  salter HFNC 8-10L,  4/17 Off precedex since last evening, agitation seems to be improving  Interim History / Subjective:  He still admits to being anxious and sleepy.  Objective   Blood pressure (!) 164/102, pulse 87, temperature 98.3 F (36.8 C), temperature source Oral, resp. rate 19, height 6' (1.829 m), weight 71.7 kg, SpO2 90 %.        Intake/Output Summary (Last 24 hours) at 03/16/2023 1843 Last data filed at 03/16/2023 1610 Gross per 24 hour  Intake 320.07 ml  Output 1150 ml  Net -829.93 ml    Filed Weights   03/15/23 0500 03/16/23 0500 03/16/23 1229  Weight: 70.6 kg 70.4 kg 71.7 kg    General:  ill appearing an lying in bed in NAD HEENT: [place]/AT, eyes anicteric Neuro: sleeping, arouses to verbal stimulation CV: S1S2, RRR  PULM:  breathing comfortably on [place], not tachypneic. Rhales improved.  GI: soft, NT Extremities: no LE edema Skin: no rashes, warm & dry  BUN 35 Cr 0.92 WBC 29.8 H/H 10.5/31.3 Platelets 379   Resolved Hospital Problem list   Hyponatremia  Assessment & Plan:   Acute Hypoxic Respiratory Failure in setting possibly of multifocal pna vs eosinophilic pna/ ILD/ autoimmune process   Acute PE with RLE DVT PMR on chronic prednisone, immunocompromised  Received 3 days of high dose steroids, completed 4/10 Daptomycin stopped 4/7 - sed rate continues  to down-trend to 70  P: - con't weaning O2 -con't high dose steroids; may be able to decrease soon if his ambulatory oxygen requirements are significantly improved -controlling anxiety to prevent derecruitment-- has had increased O2 requirements previously when agitated -completing steroids for aortitis- would cover bacterial pneumonias -PJP prophylaxis on steroids-- Bactrim MWF -DNI is appropriate  Acute hyperactive delirium/ anxiety -reduced dose seroquel during the day, con't nighttime sleep regimen. May be able to peel some of this back soon, especially as his  respiratory status improves.  Wife updated today.  Pulmonology will follow up on Friday.   Best Practice (right click and "Reselect all SmartList Selections" daily)   Diet/type: Regular consistency DVT prophylaxis: Apixaban GI prophylaxis: PPI Lines: Central line- picc Foley:  N/A Code Status:  DNR Last date of multidisciplinary goals of care discussion [4/13: Patient, his wife and son  updated at beside, decision was to continue full scope of care].  He would like to go for ventilator if needed as last resort for short-term only.  PMT consulted 4/15.  Changed to DNR/ DNI per request of wife 4/15 PM per EMD.  Ongoing GOC/ discussions  Steffanie Dunn, DO 03/16/23 6:48 PM [facility]  For contact information, see Amion. If no response to pager, please call PCCM consult pager. After hours, 7PM- 7AM, please call Elink.

## 2023-03-17 DIAGNOSIS — J8281 Chronic eosinophilic pneumonia: Secondary | ICD-10-CM | POA: Diagnosis not present

## 2023-03-17 DIAGNOSIS — R509 Fever, unspecified: Secondary | ICD-10-CM | POA: Diagnosis not present

## 2023-03-17 DIAGNOSIS — I480 Paroxysmal atrial fibrillation: Secondary | ICD-10-CM | POA: Diagnosis not present

## 2023-03-17 DIAGNOSIS — R531 Weakness: Secondary | ICD-10-CM | POA: Diagnosis not present

## 2023-03-17 DIAGNOSIS — J8282 Acute eosinophilic pneumonia: Secondary | ICD-10-CM | POA: Diagnosis not present

## 2023-03-17 DIAGNOSIS — R410 Disorientation, unspecified: Secondary | ICD-10-CM | POA: Diagnosis not present

## 2023-03-17 DIAGNOSIS — I2699 Other pulmonary embolism without acute cor pulmonale: Secondary | ICD-10-CM | POA: Diagnosis not present

## 2023-03-17 DIAGNOSIS — I4892 Unspecified atrial flutter: Secondary | ICD-10-CM | POA: Diagnosis not present

## 2023-03-17 LAB — GLUCOSE, CAPILLARY
Glucose-Capillary: 130 mg/dL — ABNORMAL HIGH (ref 70–99)
Glucose-Capillary: 116 mg/dL — ABNORMAL HIGH (ref 70–99)
Glucose-Capillary: 166 mg/dL — ABNORMAL HIGH (ref 70–99)

## 2023-03-17 MED ORDER — FLUCONAZOLE 200 MG PO TABS
200.0000 mg | ORAL_TABLET | Freq: Once | ORAL | Status: AC
  Administered 2023-03-17: 200 mg via ORAL
  Filled 2023-03-17: qty 1

## 2023-03-17 MED ORDER — NYSTATIN 100000 UNIT/ML MT SUSP
5.0000 mL | Freq: Four times a day (QID) | OROMUCOSAL | Status: DC
  Administered 2023-03-17 – 2023-03-23 (×20): 500000 [IU] via ORAL
  Filled 2023-03-17 (×23): qty 5

## 2023-03-17 NOTE — Progress Notes (Signed)
NAME:  [name], MRN:  [id number], DOB:  08/24/1930, LOS: 11 ADMISSION DATE:  03/05/2023, CONSULTATION DATE: 4/8  REFERRING MD:  Daiva Eves CHIEF COMPLAINT:  Generalized Weakness   History of Present Illness:  89 yr old male with significant pmhx of polymyalgia rheumatica on chronic steroids, recent hospitalization for aortitis, CHF, chronic leukocytosis, AAA with S/P stent graft in 2018, HLD, and DDD was admitted with an acute PE at [facility] ED and initially complaining of generalized weakness on 4/7. Patient developed SVT on the morning of 4/8 along with respiratory distress and hypoxia requiring non-re breather mask, found to have PE and DVT. Patient being managed by [facility], Cardiology, and Infectious Disease. PCCM consulted by ID to assist in managing the acute hypoxic respiratory failure.   Pertinent  Medical History   Past Medical History:  Diagnosis Date   AAA (abdominal aortic aneurysm)    Abnormal EKG    Left atrial abnormality   Allergic rhinitis    Allergy    Benign neoplasm of colon 04/14/10   3 small polyps on colonoscopy by Dr. Marina Goodell   Carpal tunnel syndrome    Cataract    Degenerative disc disease    Disturbances metabolism of methionine, homocystine, and cystathionine    Elevated homocysteine   Elevated prostate specific antigen (PSA)    Hearing loss    Hyperlipidemia    Impotence of organic origin    Penile implant   Internal hemorrhoids    Neck pain    Otosclerosis of both ears    Peripheral vascular disease    Bilateral femoral bruit   Plantar fasciitis    Polymyalgia rheumatica    Rotator cuff syndrome of left shoulder    Scoliosis    Shoulder pain      Significant Hospital Events: Including procedures, antibiotic start and stop dates in addition to other pertinent events   4/7 Admitted for Acute PE 4/8 Developed Rapid Atrial Flutter-HR 170s-180s , along with respiratory failure requiring NRB, PCCM consulted  4/12 feeling better, HHFNC 60%, 45L, steroids  tapered 4/15 PMT consult, on  salter HFNC 8-10L,  4/17 Off precedex since last evening, agitation seems to be improving  Interim History / Subjective:   Patient feeling tired today Complains of sore tongue Wife at bedside  Objective   Blood pressure (!) 150/81, pulse 91, temperature 97.9 F (36.6 C), temperature source Oral, resp. rate 18, height 6' (1.829 m), weight 69.4 kg, SpO2 92 %.        Intake/Output Summary (Last 24 hours) at 03/17/2023 1142 Last data filed at 03/17/2023 0500 Gross per 24 hour  Intake 336 ml  Output 1150 ml  Net -814 ml   Filed Weights   03/16/23 0500 03/16/23 1229 03/17/23 0612  Weight: 70.4 kg 71.7 kg 69.4 kg    General:  ill appearing, lying in bed in NAD HEENT: [place]/AT, eyes anicteric, thrush present Neuro: awake, alert, moving all extremities CV: S1S2, RRR  PULM:  breathing comfortably on , not tachypneic. Crackles present GI: soft, NT Extremities: no LE edema Skin: no rashes, warm & dry  Resolved Hospital Problem list   Hyponatremia  Assessment & Plan:   Acute Hypoxic Respiratory Failure in setting possibly of multifocal pna vs eosinophilic pna/ ILD/ autoimmune process   Acute PE with RLE DVT PMR on chronic prednisone, immunocompromised  Received 3 days of high dose steroids, completed 4/10 Daptomycin stopped 4/7 - sed rate continues to down-trend to 70  P: - con't weaning  O2 -con't high dose steroids; may be able to decrease soon if his ambulatory oxygen requirements are significantly improved -controlling anxiety to prevent derecruitment-- has had increased O2 requirements previously when agitated -completing antibiotics for aortitis- would cover bacterial pneumonias -PJP prophylaxis on steroids-- Bactrim MWF -DNI is appropriate  Acute hyperactive delirium/ anxiety -reduced dose seroquel during the day, con't nighttime sleep regimen. May be able to peel some of this back soon, especially as his respiratory status  improves.  Thrush - start nystatin 4 times daily  Wife updated today.  Pulmonology will follow over the weekend    Melody Comas, MD [facility] Office: [phone]   See Amion for personal pager PCCM on call pager [phone] until 7pm. Please call Elink 7p-7a. [phone]

## 2023-03-17 NOTE — Progress Notes (Signed)
[facility]                                                                               [name], is a 89 y.o. male, DOB - December 28, 1929, WUJ:[id number] Admit date - 03/05/2023    Outpatient Primary MD for the patient is [name], MD  LOS - 11  days    Brief summary   89 yr old male with significant pmhx of polymyalgia rheumatica on chronic steroids, recent hospitalization for aortitis, CHF, chronic leukocytosis, AAA with S/P stent graft in 2018, HLD, and DDD was admitted with an acute PE at [facility] ED and initially complaining of generalized weakness on 4/7. Patient with worsening respiratory distress and hypoxia on 4/8/ requiring non-re breather mask, found to have PE and DVT. Patient being managed by [facility], Cardiology, and Infectious Disease. PCCM consulted by ID to assist in managing the acute hypoxic respiratory failure.      Assessment & Plan    Assessment and Plan:  Acute hypoxic respiratory failure in the setting of multifocal pneumonia versus eosinophilic pneumonia from daptomycin versus interstitial lung disease/autoimmune process. Acute pulmonary embolism with right lower extremity DVT in the setting of PMR on chronic steroids, immunocompromise Daptomycin was stopped on 4/7. He was started on high-dose steroids and currently on 40 mg of Solu-Medrol every 12 hours Initially required high flow nasal cannula oxygen currently on 4 L of nasal cannula oxygen. ID on board recommended to continue with IV ceftriaxone, oral doxycycline, Bactrim for PJP prophylaxis. Patient reports nausea with doxycycline .  Physical therapy.   Paroxysmal atrial flutter/fibrillation Rate controlled with metoprolol 12.5 mg twice daily and on Eliquis for anticoagulation. Echo shows left ventricular ejection fraction of 55 to 60% with no regional wall abnormalities.  Left ventricular diastolic parameters are normal   Pulmonary embolism and right lower extremity DVT On Eliquis for  anticoagulation.  Chest pain yesterday, which appears to have resolved.  Elevated troponins. Demand ischemia from respiratory illness? Cardiology consulted by PCCM.  Currently chest pain free.   AAA s/p sciatic graft and possible aortitis Patient was on daptomycin per ID at [facility] which was discontinued for concern for of eosinophilic pneumonia Patient currently on IV ceftriaxone, doxycycline and Bactrim   Acute delirium/anxiety Multifactorial possibly hospital delirium versus from hypoxia versus from sepsis versus from steroids Initially required Precedex gtt, has been weaned off and currently on Seroquel, Atarax and trazodone.    Hyperlipidemia:  On statin/ Crestor 5 mg daily. .    PMR:  Pt is on 5 mg of prednisone at home.   BPH   Anemia of chronic disease:  Baseline hemoglobin around 11. Currently at 10.5  continue to monitor.    Chronic leukocytosis:  Recommend outpatient follow up with hematology.    GERD: On PPI/ Pepcid. Zofran.   Nausea with doxycycline,  On zofran.    Oral Thrush:  Nystatin suspension and oral diflucan ordered.    RN Pressure Injury Documentation: Pressure Injury 03/12/23 Sacrum Mid Stage 1 -  Intact skin with non-blanchable redness of a localized area usually over a bony prominence. Pink non blanching on sacrum (Active)  03/12/23 0800  Location: Sacrum  Location Orientation: Mid  Staging: Stage 1 -  Intact skin with non-blanchable redness of a localized area usually over a bony prominence.  Wound Description (Comments): Pink non blanching on sacrum  Present on Admission: No  Dressing Type Foam - Lift dressing to assess site every shift 03/17/23 0800  Foam dressing in place.   Estimated body mass index is 20.75 kg/m as calculated from the following:   Height as of this encounter: 6' (1.829 m).   Weight as of this encounter: 69.4 kg.  Code Status: DNR DVT Prophylaxis:   apixaban (ELIQUIS) tablet 5 mg   Level of Care: Level of  care: Telemetry Cardiac Family Communication: NONE at bedside.   Disposition Plan:     Remains inpatient appropriate: IV ceftriaxone.   Procedures:  Echocardiogram.   Consultants:   ID PALLIATIVE CARE PCCM  Significant events  4/7 Admitted for Acute PE 4/8 Developed Rapid Atrial Flutter-HR 170s-180s , along with respiratory failure requiring NRB, PCCM consulted  4/12 feeling better, HHFNC 60%, 45L, steroids tapered 4/15 PMT consult, on  salter HFNC 8-10L,  4/17 Off precedex since last evening, agitation seems to be improving 4/18 transfer to [facility] from PCCM.   Antimicrobials:   Anti-infectives (From admission, onward)    Start     Dose/Rate Route Frequency Ordered Stop   03/17/23 1500  fluconazole (DIFLUCAN) tablet 200 mg        200 mg Oral  Once 03/17/23 1403     03/15/23 2200  doxycycline (VIBRA-TABS) tablet 100 mg        100 mg Oral Every 12 hours 03/15/23 1109 03/23/23 2359   03/15/23 1400  cefTRIAXone (ROCEPHIN) 2 g in sodium chloride 0.9 % 100 mL IVPB        2 g 200 mL/hr over 30 Minutes Intravenous Daily 03/15/23 1109 03/23/23 2359   03/13/23 2200  vancomycin (VANCOREADY) IVPB 750 mg/150 mL  Status:  Discontinued        750 mg 150 mL/hr over 60 Minutes Intravenous Every 12 hours 03/13/23 1131 03/15/23 1109   03/10/23 2300  vancomycin (VANCOREADY) IVPB 1250 mg/250 mL  Status:  Discontinued        1,250 mg 166.7 mL/hr over 90 Minutes Intravenous Every 18 hours 03/10/23 2243 03/13/23 1131   03/10/23 2000  piperacillin-tazobactam (ZOSYN) IVPB 3.375 g  Status:  Discontinued        3.375 g 12.5 mL/hr over 240 Minutes Intravenous Every 8 hours 03/10/23 1140 03/15/23 1109   03/06/23 1715  vancomycin (VANCOREADY) IVPB 1500 mg/300 mL  Status:  Discontinued        1,500 mg 150 mL/hr over 120 Minutes Intravenous  Once 03/06/23 1628 03/06/23 1628   03/06/23 1628  vancomycin (VANCOREADY) IVPB 1250 mg/250 mL  Status:  Discontinued        1,250 mg 166.7 mL/hr over 90 Minutes  Intravenous Every 24 hours 03/06/23 1628 03/10/23 2243   03/06/23 0900  sulfamethoxazole-trimethoprim (BACTRIM DS) 800-160 MG per tablet 1 tablet        1 tablet Oral Once per day on Mon Wed Fri 03/05/23 2141     03/06/23 0800  ceFEPIme (MAXIPIME) 2 g in sodium chloride 0.9 % 100 mL IVPB  Status:  Discontinued        2 g 200 mL/hr over 30 Minutes Intravenous Every 12 hours 03/05/23 2140 03/10/23 1140   03/05/23 1845  DAPTOmycin (CUBICIN) 600 mg in sodium chloride 0.9 % IVPB  Status:  Discontinued  600 mg 124 mL/hr over 30 Minutes Intravenous Daily 03/05/23 1833 03/06/23 1546   03/05/23 1645  ceFEPIme (MAXIPIME) 2 g in sodium chloride 0.9 % 100 mL IVPB        2 g 200 mL/hr over 30 Minutes Intravenous  Once 03/05/23 1639 03/05/23 2018   03/05/23 1645  vancomycin (VANCOREADY) IVPB 1500 mg/300 mL  Status:  Discontinued        1,500 mg 150 mL/hr over 120 Minutes Intravenous  Once 03/05/23 1639 03/05/23 1645        Medications  Scheduled Meds:  alteplase  2 mg Intracatheter Once   apixaban  5 mg Oral BID   aspirin  81 mg Oral Daily   Chlorhexidine Gluconate Cloth  6 each Topical Daily   doxycycline  100 mg Oral Q12H   famotidine  20 mg Oral Daily   feeding supplement  237 mL Oral TID BM   fluconazole  200 mg Oral Once   fluorometholone  1 drop Right Eye QHS   folic acid  1 mg Oral Daily   hydrOXYzine  25 mg Oral Q6H   insulin aspart  0-15 Units Subcutaneous TID WC   melatonin  3 mg Oral QHS   methylPREDNISolone (SOLU-MEDROL) injection  40 mg Intravenous Q12H   metoprolol tartrate  12.5 mg Oral BID   nystatin  5 mL Oral QID   pantoprazole  40 mg Oral BID   QUEtiapine  12.5 mg Oral Daily   QUEtiapine  50 mg Oral QHS   rosuvastatin  5 mg Oral QHS   senna-docusate  1 tablet Oral BID   sodium chloride flush  10-40 mL Intracatheter Q12H   sulfamethoxazole-trimethoprim  1 tablet Oral Once per day on Mon Wed Fri   traZODone  200 mg Oral QHS   Continuous Infusions:  sodium  chloride Stopped (03/10/23 1127)   cefTRIAXone (ROCEPHIN)  IV 2 g (03/17/23 0917)   PRN Meds:.sodium chloride, acetaminophen **OR** acetaminophen, alum & mag hydroxide-simeth, dicyclomine, hydrALAZINE, lidocaine, menthol-cetylpyridinium, ondansetron (ZOFRAN) IV, mouth rinse, sodium chloride flush    Subjective:   [name] was seen and examined today.  Comfortable, remains on 4 lit . No new complaints.  Objective:   Vitals:   03/17/23 0600 03/17/23 0612 03/17/23 0800 03/17/23 1045  BP: (!) 154/98  (!) 150/81 (!) 143/88  Pulse: 83 87 91 77  Resp: Temp:  98 F (36.7 C) 97.9 F (36.6 C) 98.6 F (37 C)  TempSrc:  Oral Oral   SpO2: 94%  92% 92%  Weight:  69.4 kg    Height:        Intake/Output Summary (Last 24 hours) at 03/17/2023 1414 Last data filed at 03/17/2023 0500 Gross per 24 hour  Intake 336 ml  Output 1150 ml  Net -814 ml    Filed Weights   03/16/23 0500 03/16/23 1229 03/17/23 0612  Weight: 70.4 kg 71.7 kg 69.4 kg     Exam General exam: ill appearing gentleman not in distress. On 4lit of [place] OXYGEN.  Respiratory system: Clear to auscultation. Respiratory effort normal. Cardiovascular system: S1 & S2 heard, RRR. No JVD,  Gastrointestinal system: Abdomen is nondistended, soft and nontender. Central nervous system: Alert and oriented to place and person.  Extremities: Symmetric 5 x 5 power. Skin: No rashes,  Psychiatry:  Mood & affect appropriate.     Data Reviewed:  I have personally reviewed following labs and imaging studies   CBC Lab Results  Component Value Date   WBC 29.8 (H) 03/16/2023   RBC 3.31 (L) 03/16/2023   HGB 10.5 (L) 03/16/2023   HCT 31.1 (L) 03/16/2023   MCV 94.0 03/16/2023   MCH 31.7 03/16/2023   PLT 379 03/16/2023   MCHC 33.8 03/16/2023   RDW 16.4 (H) 03/16/2023   LYMPHSABS 0.8 03/12/2023   MONOABS 0.5 03/12/2023   EOSABS 0.1 03/12/2023   BASOSABS 0.1 03/12/2023     Last metabolic panel Lab Results   Component Value Date   NA 139 03/16/2023   K 4.1 03/16/2023   CL 105 03/16/2023   CO2 24 03/16/2023   BUN 35 (H) 03/16/2023   CREATININE 0.92 03/16/2023   GLUCOSE 154 (H) 03/16/2023   GFRNONAA >60 03/16/2023   GFRAA >60 01/24/2018   CALCIUM 8.0 (L) 03/16/2023   PHOS 3.3 03/14/2023   PROT 5.6 (L) 03/11/2023   ALBUMIN 2.2 (L) 03/14/2023   LABGLOB 2.5 03/21/2016   AGRATIO 1.6 03/21/2016   BILITOT 0.8 03/11/2023   ALKPHOS 78 03/11/2023   AST 23 03/11/2023   ALT 39 03/11/2023   ANIONGAP 10 03/16/2023    CBG (last 3)  Recent Labs    03/16/23 2100 03/17/23 0613 03/17/23 1114  GLUCAP 105* 130* 116*       Coagulation Profile: No results for input(s): "INR", "PROTIME" in the last 168 hours.   Radiology Studies: No results found.     Kathlen Mody M.D. [facility] 03/17/2023, 2:14 PM  Available via Epic secure chat 7am-7pm After 7 pm, please refer to night coverage provider listed on amion.

## 2023-03-17 NOTE — Progress Notes (Signed)
Subjective:  Nausea this am--possibly from the doxycyline we switched him to  Antibiotics:  Anti-infectives (From admission, onward)    Start     Dose/Rate Route Frequency Ordered Stop   03/17/23 1500  fluconazole (DIFLUCAN) tablet 200 mg        200 mg Oral  Once 03/17/23 1403     03/15/23 2200  doxycycline (VIBRA-TABS) tablet 100 mg        100 mg Oral Every 12 hours 03/15/23 1109 03/23/23 2359   03/15/23 1400  cefTRIAXone (ROCEPHIN) 2 g in sodium chloride 0.9 % 100 mL IVPB        2 g 200 mL/hr over 30 Minutes Intravenous Daily 03/15/23 1109 03/23/23 2359   03/13/23 2200  vancomycin (VANCOREADY) IVPB 750 mg/150 mL  Status:  Discontinued        750 mg 150 mL/hr over 60 Minutes Intravenous Every 12 hours 03/13/23 1131 03/15/23 1109   03/10/23 2300  vancomycin (VANCOREADY) IVPB 1250 mg/250 mL  Status:  Discontinued        1,250 mg 166.7 mL/hr over 90 Minutes Intravenous Every 18 hours 03/10/23 2243 03/13/23 1131   03/10/23 2000  piperacillin-tazobactam (ZOSYN) IVPB 3.375 g  Status:  Discontinued        3.375 g 12.5 mL/hr over 240 Minutes Intravenous Every 8 hours 03/10/23 1140 03/15/23 1109   03/06/23 1715  vancomycin (VANCOREADY) IVPB 1500 mg/300 mL  Status:  Discontinued        1,500 mg 150 mL/hr over 120 Minutes Intravenous  Once 03/06/23 1628 03/06/23 1628   03/06/23 1628  vancomycin (VANCOREADY) IVPB 1250 mg/250 mL  Status:  Discontinued        1,250 mg 166.7 mL/hr over 90 Minutes Intravenous Every 24 hours 03/06/23 1628 03/10/23 2243   03/06/23 0900  sulfamethoxazole-trimethoprim (BACTRIM DS) 800-160 MG per tablet 1 tablet        1 tablet Oral Once per day on Mon Wed Fri 03/05/23 2141     03/06/23 0800  ceFEPIme (MAXIPIME) 2 g in sodium chloride 0.9 % 100 mL IVPB  Status:  Discontinued        2 g 200 mL/hr over 30 Minutes Intravenous Every 12 hours 03/05/23 2140 03/10/23 1140   03/05/23 1845  DAPTOmycin (CUBICIN) 600 mg in sodium chloride 0.9 % IVPB  Status:   Discontinued        600 mg 124 mL/hr over 30 Minutes Intravenous Daily 03/05/23 1833 03/06/23 1546   03/05/23 1645  ceFEPIme (MAXIPIME) 2 g in sodium chloride 0.9 % 100 mL IVPB        2 g 200 mL/hr over 30 Minutes Intravenous  Once 03/05/23 1639 03/05/23 2018   03/05/23 1645  vancomycin (VANCOREADY) IVPB 1500 mg/300 mL  Status:  Discontinued        1,500 mg 150 mL/hr over 120 Minutes Intravenous  Once 03/05/23 1639 03/05/23 1645       Medications: Scheduled Meds:  alteplase  2 mg Intracatheter Once   apixaban  5 mg Oral BID   aspirin  81 mg Oral Daily   Chlorhexidine Gluconate Cloth  6 each Topical Daily   doxycycline  100 mg Oral Q12H   famotidine  20 mg Oral Daily   feeding supplement  237 mL Oral TID BM   fluconazole  200 mg Oral Once   fluorometholone  1 drop Right Eye QHS   folic acid  1 mg Oral Daily   hydrOXYzine  25 mg Oral Q6H   insulin aspart  0-15 Units Subcutaneous TID WC   melatonin  3 mg Oral QHS   methylPREDNISolone (SOLU-MEDROL) injection  40 mg Intravenous Q12H   metoprolol tartrate  12.5 mg Oral BID   nystatin  5 mL Oral QID   pantoprazole  40 mg Oral BID   QUEtiapine  12.5 mg Oral Daily   QUEtiapine  50 mg Oral QHS   rosuvastatin  5 mg Oral QHS   senna-docusate  1 tablet Oral BID   sodium chloride flush  10-40 mL Intracatheter Q12H   sulfamethoxazole-trimethoprim  1 tablet Oral Once per day on Mon Wed Fri   traZODone  200 mg Oral QHS   Continuous Infusions:  sodium chloride Stopped (03/10/23 1127)   cefTRIAXone (ROCEPHIN)  IV 2 g (03/17/23 0917)   PRN Meds:.sodium chloride, acetaminophen **OR** acetaminophen, alum & mag hydroxide-simeth, dicyclomine, hydrALAZINE, lidocaine, menthol-cetylpyridinium, ondansetron (ZOFRAN) IV, mouth rinse, sodium chloride flush    Objective: Weight change: 1.3 kg  Intake/Output Summary (Last 24 hours) at 03/17/2023 1426 Last data filed at 03/17/2023 0500 Gross per 24 hour  Intake 336 ml  Output 1150 ml  Net -814 ml     Blood pressure (!) 143/88, pulse 77, temperature 98.6 F (37 C), resp. rate 20, height 6' (1.829 m), weight 69.4 kg, SpO2 92 %. Temp:  [97.9 F (36.6 C)-98.6 F (37 C)] 98.6 F (37 C) (04/19 1045) Pulse Rate:  [77-91] 77 (04/19 1045) Resp:  [16-20] 20 (04/19 1045) BP: (143-164)/(81-102) 143/88 (04/19 1045) SpO2:  [90 %-94 %] 92 % (04/19 1045) Weight:  [69.4 kg] 69.4 kg (04/19 0612)  Physical Exam: Physical Exam Constitutional:      Appearance: He is well-developed.  HENT:     Head: Normocephalic and atraumatic.  Eyes:     Conjunctiva/sclera: Conjunctivae normal.  Cardiovascular:     Rate and Rhythm: Regular rhythm. Tachycardia present.     Heart sounds: No murmur heard.    No friction rub. No gallop.  Pulmonary:     Effort: Pulmonary effort is normal. No respiratory distress.     Breath sounds: No stridor. No wheezing or rhonchi.  Abdominal:     General: There is no distension.     Palpations: Abdomen is soft.  Musculoskeletal:        General: Normal range of motion.     Cervical back: Normal range of motion and neck supple.  Skin:    General: Skin is warm and dry.     Findings: No erythema or rash.  Neurological:     General: No focal deficit present.     Mental Status: He is alert and oriented to person, place, and time.  Psychiatric:        Attention and Perception: Attention normal.        Mood and Affect: Mood normal.        Speech: Speech is delayed.        Behavior: Behavior normal.        Thought Content: Thought content normal.        Cognition and Memory: Cognition and memory normal.        Judgment: Judgment normal.    CBC:    BMET Recent Labs    03/15/23 0402 03/16/23 0435  NA 139 139  K 3.9 4.1  CL 105 105  CO2 23 24  GLUCOSE 162* 154*  BUN 33* 35*  CREATININE 1.10 0.92  CALCIUM 8.2* 8.0*  Liver Panel  No results for input(s): "PROT", "ALBUMIN", "AST", "ALT", "ALKPHOS", "BILITOT", "BILIDIR", "IBILI" in the last 72  hours.      Sedimentation Rate Recent Labs    03/15/23 0402  ESRSEDRATE 70*    C-Reactive Protein No results for input(s): "CRP" in the last 72 hours.   Micro Results: Recent Results (from the past 720 hour(s))  Culture, blood (routine x 2)     Status: None   Collection Time: 03/05/23  4:14 PM   Specimen: BLOOD  Result Value Ref Range Status   Specimen Description BLOOD SITE NOT SPECIFIED  Final   Special Requests   Final    BOTTLES DRAWN AEROBIC AND ANAEROBIC Blood Culture adequate volume   Culture   Final    NO GROWTH 5 DAYS Performed at [facility], [address] [place], [place] [address]    Report Status 03/10/2023 FINAL  Final  Resp panel by RT-PCR (RSV, Flu A&B, Covid) Anterior Nasal Swab     Status: None   Collection Time: 03/05/23  4:29 PM   Specimen: Anterior Nasal Swab  Result Value Ref Range Status   SARS Coronavirus 2 by RT PCR NEGATIVE NEGATIVE Final   Influenza A by PCR NEGATIVE NEGATIVE Final   Influenza B by PCR NEGATIVE NEGATIVE Final    Comment: (NOTE) The Xpert Xpress SARS-CoV-2/FLU/RSV plus assay is intended as an aid in the diagnosis of influenza from Nasopharyngeal swab specimens and should not be used as a sole basis for treatment. Nasal washings and aspirates are unacceptable for Xpert Xpress SARS-CoV-2/FLU/RSV testing.  Fact Sheet for Patients: BloggerCourse.com  Fact Sheet for Healthcare Providers: SeriousBroker.it  This test is not yet approved or cleared by the [place] FDA and has been authorized for detection and/or diagnosis of SARS-CoV-2 by FDA under an Emergency Use Authorization (EUA). This EUA will remain in effect (meaning this test can be used) for the duration of the COVID-19 declaration under Section 564(b)(1) of the Act, 21 U.S.C. section 360bbb-3(b)(1), unless the authorization is terminated or revoked.     Resp Syncytial Virus by PCR NEGATIVE NEGATIVE  Final    Comment: (NOTE) Fact Sheet for Patients: BloggerCourse.com  Fact Sheet for Healthcare Providers: SeriousBroker.it  This test is not yet approved or cleared by the [place] FDA and has been authorized for detection and/or diagnosis of SARS-CoV-2 by FDA under an Emergency Use Authorization (EUA). This EUA will remain in effect (meaning this test can be used) for the duration of the COVID-19 declaration under Section 564(b)(1) of the Act, 21 U.S.C. section 360bbb-3(b)(1), unless the authorization is terminated or revoked.  Performed at [facility], [address] [place], [place] [address]   Culture, blood (routine x 2)     Status: None   Collection Time: 03/05/23  4:33 PM   Specimen: BLOOD LEFT FOREARM  Result Value Ref Range Status   Specimen Description BLOOD LEFT FOREARM  Final   Special Requests   Final    BOTTLES DRAWN AEROBIC AND ANAEROBIC Blood Culture results may not be optimal due to an excessive volume of blood received in culture bottles   Culture   Final    NO GROWTH 5 DAYS Performed at [facility], [address] [place], [place] [address]    Report Status 03/10/2023 FINAL  Final  MRSA Next Gen by PCR, Nasal     Status: None   Collection Time: 03/06/23  8:34 PM   Specimen: Nasal  Mucosa; Nasal Swab  Result Value Ref Range Status   MRSA by PCR Next Gen NOT DETECTED NOT DETECTED Final    Comment: (NOTE) The GeneXpert MRSA Assay (FDA approved for NASAL specimens only), is one component of a comprehensive MRSA colonization surveillance program. It is not intended to diagnose MRSA infection nor to guide or monitor treatment for MRSA infections. Test performance is not FDA approved in patients less than 79 years old. Performed at [facility], [address] [place], [place] [address]     Studies/Results: No results found.    Assessment/Plan:  INTERVAL HISTORY:   He seems  much better in terms of his anxiety and his delirium though his wife tells me that it was a "rough morning"  Principal Problem:   Acute pulmonary embolism Active Problems:   Hyperlipidemia   PMR (polymyalgia rheumatica)   AAA (abdominal aortic aneurysm) without rupture (HCC)   Generalized weakness   Acute respiratory failure with hypoxia   Acute hyponatremia   Elevated troponin   Lactic acidosis   HCAP (healthcare-associated pneumonia)   Chronic diastolic CHF (congestive heart failure)   BPH (benign prostatic hyperplasia)   Pulmonary embolism   Eosinophilic pneumonia   Aortitis   Atrial flutter   FUO (fever of unknown origin)   S/P insertion of endovascular thoracic aortic stent graft   Drug-induced insomnia   Anxiety   DVT, lower extremity, distal, acute, right   PAF (paroxysmal atrial fibrillation)   Acute metabolic encephalopathy   Paroxysmal A-fib   Pressure injury of skin   Delirium   Acute eosinophilic pneumonia    [name] is a 89 y.o. male with complicated medical history including aortic aneurysm status post placement of stent graft, polymyalgia rheumatica that has been well-controlled on prednisone who has been experiencing malaise for nearly 8 months with intermittent fevers and elevated inflammatory markers, who was worked up for this condition by infectious disease at [facility] including PET scan that showed increased uptake in the aorta near stent graft with question of aortitis.  The tagged white cell scan subsequently did not show uptake in this area or anywhere else in the body.  I reviewed the case with Dr.Julia Dolores Frame yesterday over the phone and she stated that they had reviewed the PET scan again with radiology and that radiology is not entirely convinced of the uptake being particularly dramatic.  Regardless the patient was the begun on empiric antibiotic regimen for possible bacterial infectious aortitis with daptomycin and ceftriaxone.  The  plan was to see how the patient did on this including monitoring his inflammatory markers.  All along however there has been suspicion that there might be an autoimmune process involved and the patient is followed closely by Dr. Alberteen Spindle with nephrology at [facility].  He has now been admitted to [facility] with an acute pulmonary embolism and a CT angiogram that also shows multifocal infiltrates.  He is experienced progressive hypoxemic respiratory failure requiring high flow oxygen currently on high flow oxygen via nasal cannula.  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  His antibiotics switched to vancomycin and cefepime having been moved from daptomycin ceftriaxone disease.  CCM now gotten rid of cefpime in favor of zosyn due to concerns for CNS side effects of cefepime/  He improved from an oxygenation stand point over weekend on lower dose  steroids with diuresis and he is with less anxiety though his delirium is still an issue with being on precedex though now sp seroquel and small dose ativan  Troponins have risen now  #1  Pulmonary embolism with heart strain and DVT identified in the right lower extremity  On anticoagulation  #2 Pneumonia: favor eosinophilic PNA vs perhaps more likely declaration of autoimmune pulmonary pathology  He continues on steroids He is on BACTRIM for PCP prophylaxis  Fungitell was negative    #3 ? Aortitis: his original stop date for his empiric antibiotics was 425  As above we are changing to ceftriaxone and doxycycline. If he nears DC would be completely OK with change to purely oral regimen  NOTE If the doxycycline is too difficult for him to tolerate with nausea etc we can dc it  #4  Suspected underlying autoimmune disease potentially with pulmonary complications and patient with known  polymyalgia rheumatica:  He is on corticosteroids  ESR is down fwiw  I emphasized to the patient and his wife that someone figuring out the auto-immune pathology would be critical.   Darl Pikes tells me that while they very much liked Dr. Drenda Freeze whome they saw they had originally wanted to see. Alben Deeds, MD.  I texted Fayrene Fearing and discussed the case with him and idea of patient being switched over to him within their practice.  He told me that they had recently developed a policy that patients could not switch providers because they had had some difficulty with this happening too much though I would bet an exception could be made.  ON the bigger issue on "what is going on from [facility] pathology" Fayrene Fearing has reviewed the case with Dr. Drenda Freeze and gone through Epic data preliminarily.  He thinks that the patient really DOES need to continue workup at an academic center such as [facility] or [facility]  Dr. Alberteen Spindle is himself an expert in vasculitis within the Dept of Nephrology (he is also chair of Nephrology)  Certainly Dr. Alberteen Spindle himeself or if not him a Rheumatologist at [facility] is who Fayrene Fearing would recommend.  I have also reached out to Rosalia Hammers, MD in their ID division who treated pt for aortitis to see about connections to Rheum if they need to be made  # + troponins: does have PE but ? Demand ischemia, coronary event   #5 Delirium: the biggest obstacle for him is this and anxiety but this seems [facility] improved in my opintion and he is eating again and participating in PT  I have personally spent 56 minutes involved in face-to-face and non-face-to-face activities for this patient on the day of the visit. Professional time spent includes the following activities: Preparing to see the patient (review of tests), Obtaining and/or reviewing separately obtained history (admission/discharge record), Performing a medically appropriate examination and/or evaluation , Ordering medications/tests/procedures,  referring and communicating with other health care professionals, Documenting clinical information in the EMR, Independently interpreting results (not separately reported), Communicating results to the patient/family/caregiver, Counseling and educating the patient/family/caregiver and Care coordination (not separately reported).    Dr. Luciana Axe is available this weekend for questions and a new team will be here on Monday.      LOS: 11 days   [name] 03/17/2023, 2:26 PM

## 2023-03-17 NOTE — TOC Progression Note (Signed)
Transition of Care [facility]) - Progression Note    Patient Details  Name: [name] MRN: [id number] Date of Birth: [birth year]  Transition of Care [facility]) CM/SW Contact  Leone Haven, RN Phone Number: 03/17/2023, 3:33 PM  Clinical Narrative:    Patient is from home with wife, was previously getting iv abx at home, active with [facility] and Jeri Modena was following with [facility].  Right now wife is looking at all options for patient, dispo has not been decided yet.  NCM informed her that we will be following along to see what they have decided for his disposition.     Expected Discharge Plan: Home w Home Health Services Barriers to Discharge: Continued Medical Work up  Expected Discharge Plan and Services   Discharge Planning Services: CM Consult Post Acute Care Choice: Home Health Living arrangements for the past 2 months: Single Family Home                                       Social Determinants of Health (SDOH) Interventions SDOH Screenings   Food Insecurity: No Food Insecurity (03/15/2023)  Housing: Low Risk  (03/15/2023)  Transportation Needs: No Transportation Needs (03/15/2023)  Utilities: Not At Risk (03/15/2023)  Financial Resource Strain: Low Risk  (03/07/2023)  Tobacco Use: Low Risk  (03/05/2023)    Readmission Risk Interventions     No data to display

## 2023-03-17 NOTE — Progress Notes (Addendum)
Physical Therapy Treatment Patient Details Name: [name] MRN: [id number] DOB: Jan 27, 1930 Today's Date: 03/17/2023   History of Present Illness 89 yr old male  was admitted with an acute PE at [facility] ED and initially complaining of generalized weakness on 4/7. Patient developed SVT on the morning of 4/8 along with respiratory distress and hypoxia requiring non-re breather mask. QMV:[id number] rheumatica on chronic steroids, recent hospitalization for aortitis, CHF, chronic leukocytosis, AAA with S/P stent graft in 2018, HLD, and DDD    PT Comments    Pt received in bed, declining mobility OOB but agreeable to bed exercises. Pt on 5L via HFNC. SpO2 92% at rest. Desat to 86% with light bed exercises. Pt with visible anxiety and c/o nausea. 3 minute recovery time to return to 90%. Pt instructed on simple ROM exercises to perform BUE/LE throughout the day.     Recommendations for follow up therapy are one component of a multi-disciplinary discharge planning process, led by the attending physician.  Recommendations may be updated based on patient status, additional functional criteria and insurance authorization.  Follow Up Recommendations       Assistance Recommended at Discharge Frequent or constant Supervision/Assistance  Patient can return home with the following Assistance with cooking/housework;Direct supervision/assist for medications management;Direct supervision/assist for financial management;Assist for transportation;Help with stairs or ramp for entrance;A lot of help with walking and/or transfers;A lot of help with bathing/dressing/bathroom   Equipment Recommendations  Wheelchair cushion (measurements PT);Wheelchair (measurements PT);Hospital bed;BSC/3in1    Recommendations for Other Services       Precautions / Restrictions Precautions Precautions: Fall;Other (comment) Precaution Comments: watch sats and BP Restrictions Weight Bearing Restrictions: No     Mobility  Bed  Mobility                    Transfers                        Ambulation/Gait                   Stairs             Wheelchair Mobility    Modified Rankin (Stroke Patients Only)       Balance                                            Cognition Arousal/Alertness: Awake/alert Behavior During Therapy: WFL for tasks assessed/performed Overall Cognitive Status: Impaired/Different from baseline Area of Impairment: Problem solving                             Problem Solving: Slow processing          Exercises General Exercises - Lower Extremity Ankle Circles/Pumps: AROM, Both, 10 reps, Supine Heel Slides: AROM, Right, Left, 10 reps, Supine Hip Flexion/Marching: AROM, Right, Left, 10 reps    General Comments General comments (skin integrity, edema, etc.): Very poor activity tolerance. Desat to 86% on 5L with light bed exercises. 3 minute recovery time back to 90%. With desat, pt anxious and with c/o nausea.      Pertinent Vitals/Pain Pain Assessment Pain Assessment: Faces Faces Pain Scale: Hurts little more Pain Location: chest with shortness of breath Pain Descriptors / Indicators: Discomfort, Grimacing, Guarding Pain Intervention(s): Limited activity within patient's tolerance, Monitored during session  Home Living                          Prior Function            PT Goals (current goals can now be found in the care plan section) Acute Rehab PT Goals Patient Stated Goal: be mobile again, feel better Progress towards PT goals: Not progressing toward goals - comment (poor activity tolerance)    Frequency    Min 1X/week      PT Plan Current plan remains appropriate    Co-evaluation              AM-PAC PT "6 Clicks" Mobility   Outcome Measure  Help needed turning from your back to your side while in a flat bed without using bedrails?: A Little Help needed moving from  lying on your back to sitting on the side of a flat bed without using bedrails?: A Little Help needed moving to and from a bed to a chair (including a wheelchair)?: Total Help needed standing up from a chair using your arms (e.g., wheelchair or bedside chair)?: Total Help needed to walk in hospital room?: Total Help needed climbing 3-5 steps with a railing? : Total 6 Click Score: 10    End of Session Equipment Utilized During Treatment: Oxygen (5L) Activity Tolerance: Patient limited by fatigue Patient left: in bed;with call bell/phone within reach Nurse Communication: Mobility status PT Visit Diagnosis: Difficulty in walking, not elsewhere classified (R26.2);Other abnormalities of gait and mobility (R26.89);Unsteadiness on feet (R26.81)     Time: 1610-9604 PT Time Calculation (min) (ACUTE ONLY): 16 min  Charges:  $Therapeutic Exercise: 8-22 mins                     Ferd Glassing., PT  Office # [phone]    Ilda Foil 03/17/2023, 8:55 AM

## 2023-03-17 NOTE — Consult Note (Signed)
   [facility] CM Inpatient Consult   03/17/2023  [name] 1930-10-26 [id number]  [facility] [[facility]]  Accountable Care Organization [ACO] Patient: Medicare ACO REACH  Primary Care Provider:  [name], MD with [facility]   Patient screened for hospitalization with noted extreme high risk score for unplanned readmission risk 11 day length of stay [which included an ICU stay] and to assess for potential [facility]  [[facility]] Care Management service needs for post hospital transition for care coordination.  Patient discussed in unit progression meeting for progress and needs,  Review of patient's electronic medical record reveals patient is from home. Reviewed MD progress notes and palliative care notes. SDOH reviewed with no current needs noted.   Plan:  Continue to follow progress and disposition to assess for post hospital community care coordination/management needs.  Follow up with inpatient [facility] team and Palliative team for progress.  Referral request for community care coordination: pending progress and disposition needs.  Of note, [facility] Care Management/Population Health does not replace or interfere with any arrangements made by the Inpatient Transition of Care team.  For questions contact:   Charlesetta Shanks, RN BSN CCM [facility]  [phone] business mobile phone Toll free office [phone]  *Concierge Line  [phone] Fax number: [phone] [email or web address]

## 2023-03-17 NOTE — Progress Notes (Signed)
[facility] 3E04 [facility]) [facility] Liaison Note  Noted transition of patient from ICU to floor.  ACC continues to follow peripherally to support family with hospice needs as they continue to solidify disposition decision making.   Please call with any hospice related questions or concerns.  Doreatha Martin, RN, BSN [facility] Liaison [phone]

## 2023-03-18 DIAGNOSIS — I2699 Other pulmonary embolism without acute cor pulmonale: Secondary | ICD-10-CM | POA: Diagnosis not present

## 2023-03-18 DIAGNOSIS — R531 Weakness: Secondary | ICD-10-CM | POA: Diagnosis not present

## 2023-03-18 DIAGNOSIS — J9601 Acute respiratory failure with hypoxia: Secondary | ICD-10-CM | POA: Diagnosis not present

## 2023-03-18 DIAGNOSIS — I4892 Unspecified atrial flutter: Secondary | ICD-10-CM | POA: Diagnosis not present

## 2023-03-18 DIAGNOSIS — J8282 Acute eosinophilic pneumonia: Secondary | ICD-10-CM | POA: Diagnosis not present

## 2023-03-18 LAB — GLUCOSE, CAPILLARY
Glucose-Capillary: 156 mg/dL — ABNORMAL HIGH (ref 70–99)
Glucose-Capillary: 164 mg/dL — ABNORMAL HIGH (ref 70–99)
Glucose-Capillary: 175 mg/dL — ABNORMAL HIGH (ref 70–99)
Glucose-Capillary: 165 mg/dL — ABNORMAL HIGH (ref 70–99)

## 2023-03-18 MED ORDER — PREDNISONE 20 MG PO TABS
40.0000 mg | ORAL_TABLET | Freq: Every day | ORAL | Status: DC
  Administered 2023-03-19 – 2023-03-23 (×5): 40 mg via ORAL
  Filled 2023-03-18 (×5): qty 2

## 2023-03-18 MED ORDER — ALTEPLASE 2 MG IJ SOLR
2.0000 mg | Freq: Once | INTRAMUSCULAR | Status: AC
  Administered 2023-03-18: 2 mg
  Filled 2023-03-18: qty 2

## 2023-03-18 NOTE — Progress Notes (Signed)
[facility]                                                                               [name], is a 89 y.o. male, DOB - [birth year], XBJ:[id number] Admit date - 03/05/2023    Outpatient Primary MD for the patient is [name], MD  LOS - 12  days    Brief summary   89 yr old male with significant pmhx of polymyalgia rheumatica on chronic steroids, recent hospitalization for aortitis, CHF, chronic leukocytosis, AAA with S/P stent graft in 2018, HLD, and DDD was admitted with an acute PE at [facility] ED and initially complaining of generalized weakness on 4/7. Patient with worsening respiratory distress and hypoxia on 4/8/ requiring non-re breather mask, found to have PE and DVT. Patient being managed by [facility], Cardiology, and Infectious Disease. PCCM consulted by ID to assist in managing the acute hypoxic respiratory failure.      Assessment & Plan    Assessment and Plan:  Acute hypoxic respiratory failure in the setting of multifocal pneumonia versus eosinophilic pneumonia from daptomycin versus interstitial lung disease/autoimmune process. Acute pulmonary embolism with right lower extremity DVT in the setting of PMR on chronic steroids, immunocompromise Daptomycin was stopped on 4/7. He was started on high-dose steroids and currently on 40 mg of Solu-Medrol every 12 hours Initially required high flow nasal cannula oxygen currently on 4 L of nasal cannula oxygen. ID on board recommended to continue with IV ceftriaxone, oral doxycycline, Bactrim for PJP prophylaxis. Patient reports nausea with doxycycline, better with zofran. He reports not having any appetite. He remains on 4 lit of [place] oxygen.  Physical therapy.   Paroxysmal atrial flutter/fibrillation Rate controlled with metoprolol 12.5 mg twice daily and on Eliquis for anticoagulation. Echo shows left ventricular ejection fraction of 55 to 60% with no regional wall abnormalities.  Left ventricular diastolic  parameters are normal   Pulmonary embolism and right lower extremity DVT On Eliquis for anticoagulation. He remains on 4 lit of  oxygen.   Chest pain yesterday, which appears to have resolved.  Elevated troponins. Demand ischemia from respiratory illness? Cardiology consulted by PCCM.  Currently chest pain free.   AAA s/p sciatic graft and possible aortitis Patient was on daptomycin per ID at [facility] which was discontinued for concern for of eosinophilic pneumonia Patient currently on IV ceftriaxone, doxycycline and Bactrim for 6 more days to complete the course.    Acute delirium/anxiety Multifactorial possibly hospital delirium versus from hypoxia versus from sepsis versus from steroids Initially required Precedex gtt, has been weaned off and currently on Seroquel, Atarax and trazodone.    Hyperlipidemia:  On statin/ Crestor 5 mg daily. .    PMR:  Pt is on 5 mg of prednisone at home.   BPH   Anemia of chronic disease:  Baseline hemoglobin around 11. Currently at 10.5  continue to monitor.    Chronic leukocytosis:  Recommend outpatient follow up with hematology.    GERD: On PPI/ Pepcid. Zofran.   Nausea with doxycycline,  On zofran.    Oral Thrush:  Nystatin suspension and oral diflucan ordered.  Improving.    RN Pressure Injury Documentation:  Pressure Injury 03/12/23 Sacrum Mid Stage 1 -  Intact skin with non-blanchable redness of a localized area usually over a bony prominence. Pink non blanching on sacrum (Active)  03/12/23 0800  Location: Sacrum  Location Orientation: Mid  Staging: Stage 1 -  Intact skin with non-blanchable redness of a localized area usually over a bony prominence.  Wound Description (Comments): Pink non blanching on sacrum  Present on Admission: No  Dressing Type Foam - Lift dressing to assess site every shift 03/18/23 0900  Foam dressing in place.   Estimated body mass index is 20.21 kg/m as calculated from the following:    Height as of this encounter: 6' (1.829 m).   Weight as of this encounter: 67.6 kg.  Code Status: DNR DVT Prophylaxis:   apixaban (ELIQUIS) tablet 5 mg   Level of Care: Level of care: Telemetry Cardiac Family Communication: NONE at bedside.   Disposition Plan:     Remains inpatient appropriate: IV ceftriaxone, IV steroids.   Procedures:  Echocardiogram.   Consultants:   ID PALLIATIVE CARE PCCM  Significant events  4/7 Admitted for Acute PE 4/8 Developed Rapid Atrial Flutter-HR 170s-180s , along with respiratory failure requiring NRB, PCCM consulted  4/12 feeling better, HHFNC 60%, 45L, steroids tapered 4/15 PMT consult, on  salter HFNC 8-10L,  4/17 Off precedex since last evening, agitation seems to be improving 4/18 transfer to [facility] from PCCM.   Antimicrobials:   Anti-infectives (From admission, onward)    Start     Dose/Rate Route Frequency Ordered Stop   03/17/23 1500  fluconazole (DIFLUCAN) tablet 200 mg        200 mg Oral  Once 03/17/23 1403 03/17/23 1540   03/15/23 2200  doxycycline (VIBRA-TABS) tablet 100 mg        100 mg Oral Every 12 hours 03/15/23 1109 03/23/23 2359   03/15/23 1400  cefTRIAXone (ROCEPHIN) 2 g in sodium chloride 0.9 % 100 mL IVPB        2 g 200 mL/hr over 30 Minutes Intravenous Daily 03/15/23 1109 03/23/23 2359   03/13/23 2200  vancomycin (VANCOREADY) IVPB 750 mg/150 mL  Status:  Discontinued        750 mg 150 mL/hr over 60 Minutes Intravenous Every 12 hours 03/13/23 1131 03/15/23 1109   03/10/23 2300  vancomycin (VANCOREADY) IVPB 1250 mg/250 mL  Status:  Discontinued        1,250 mg 166.7 mL/hr over 90 Minutes Intravenous Every 18 hours 03/10/23 2243 03/13/23 1131   03/10/23 2000  piperacillin-tazobactam (ZOSYN) IVPB 3.375 g  Status:  Discontinued        3.375 g 12.5 mL/hr over 240 Minutes Intravenous Every 8 hours 03/10/23 1140 03/15/23 1109   03/06/23 1715  vancomycin (VANCOREADY) IVPB 1500 mg/300 mL  Status:  Discontinued        1,500  mg 150 mL/hr over 120 Minutes Intravenous  Once 03/06/23 1628 03/06/23 1628   03/06/23 1628  vancomycin (VANCOREADY) IVPB 1250 mg/250 mL  Status:  Discontinued        1,250 mg 166.7 mL/hr over 90 Minutes Intravenous Every 24 hours 03/06/23 1628 03/10/23 2243   03/06/23 0900  sulfamethoxazole-trimethoprim (BACTRIM DS) 800-160 MG per tablet 1 tablet        1 tablet Oral Once per day on Mon Wed Fri 03/05/23 2141     03/06/23 0800  ceFEPIme (MAXIPIME) 2 g in sodium chloride 0.9 % 100 mL IVPB  Status:  Discontinued  2 g 200 mL/hr over 30 Minutes Intravenous Every 12 hours 03/05/23 2140 03/10/23 1140   03/05/23 1845  DAPTOmycin (CUBICIN) 600 mg in sodium chloride 0.9 % IVPB  Status:  Discontinued        600 mg 124 mL/hr over 30 Minutes Intravenous Daily 03/05/23 1833 03/06/23 1546   03/05/23 1645  ceFEPIme (MAXIPIME) 2 g in sodium chloride 0.9 % 100 mL IVPB        2 g 200 mL/hr over 30 Minutes Intravenous  Once 03/05/23 1639 03/05/23 2018   03/05/23 1645  vancomycin (VANCOREADY) IVPB 1500 mg/300 mL  Status:  Discontinued        1,500 mg 150 mL/hr over 120 Minutes Intravenous  Once 03/05/23 1639 03/05/23 1645        Medications  Scheduled Meds:  alteplase  2 mg Intracatheter Once   apixaban  5 mg Oral BID   aspirin  81 mg Oral Daily   Chlorhexidine Gluconate Cloth  6 each Topical Daily   doxycycline  100 mg Oral Q12H   famotidine  20 mg Oral Daily   feeding supplement  237 mL Oral TID BM   fluorometholone  1 drop Right Eye QHS   folic acid  1 mg Oral Daily   hydrOXYzine  25 mg Oral Q6H   insulin aspart  0-15 Units Subcutaneous TID WC   melatonin  3 mg Oral QHS   methylPREDNISolone (SOLU-MEDROL) injection  40 mg Intravenous Q12H   metoprolol tartrate  12.5 mg Oral BID   nystatin  5 mL Oral QID   pantoprazole  40 mg Oral BID   [START ON 03/19/2023] predniSONE  40 mg Oral Q breakfast   QUEtiapine  12.5 mg Oral Daily   QUEtiapine  50 mg Oral QHS   rosuvastatin  5 mg Oral QHS    senna-docusate  1 tablet Oral BID   sodium chloride flush  10-40 mL Intracatheter Q12H   sulfamethoxazole-trimethoprim  1 tablet Oral Once per day on Mon Wed Fri   traZODone  200 mg Oral QHS   Continuous Infusions:  sodium chloride Stopped (03/10/23 1127)   cefTRIAXone (ROCEPHIN)  IV 2 g (03/18/23 1028)   PRN Meds:.sodium chloride, acetaminophen **OR** acetaminophen, alum & mag hydroxide-simeth, dicyclomine, hydrALAZINE, lidocaine, menthol-cetylpyridinium, ondansetron (ZOFRAN) IV, mouth rinse, sodium chloride flush    Subjective:   Ji Feldner was seen and examined today.  Sleeping, comfortable, on 4 l it of [place] oxygen. No appetite.   Objective:   Vitals:   03/18/23 0000 03/18/23 0400 03/18/23 0452 03/18/23 0800  BP: 111/71 130/69  135/78  Pulse: 82 87  88  Resp: 16 19  (!) 21  Temp:   97.6 F (36.4 C)   TempSrc:   Oral   SpO2: 96% 97%  97%  Weight:   67.6 kg   Height:        Intake/Output Summary (Last 24 hours) at 03/18/2023 1438 Last data filed at 03/18/2023 0453 Gross per 24 hour  Intake 200 ml  Output 1350 ml  Net -1150 ml    Filed Weights   03/16/23 1229 03/17/23 0612 03/18/23 0452  Weight: 71.7 kg 69.4 kg 67.6 kg     Exam General exam: Ill appearing gentleman, on 4 lit of [place] oxygen. Not in distress.  Respiratory system: diminished air entry. No wheezing heard.  Cardiovascular system: S1 & S2 heard, RRR. No JVD,  Gastrointestinal system: Abdomen is nondistended, soft and nontender.  Central nervous system: Alert and oriented  to person and place only.  Extremities: no pedal edema Skin: No rashes, Psychiatry: flat affect.      Data Reviewed:  I have personally reviewed following labs and imaging studies   CBC Lab Results  Component Value Date   WBC 29.8 (H) 03/16/2023   RBC 3.31 (L) 03/16/2023   HGB 10.5 (L) 03/16/2023   HCT 31.1 (L) 03/16/2023   MCV 94.0 03/16/2023   MCH 31.7 03/16/2023   PLT 379 03/16/2023   MCHC 33.8 03/16/2023   RDW  16.4 (H) 03/16/2023   LYMPHSABS 0.8 03/12/2023   MONOABS 0.5 03/12/2023   EOSABS 0.1 03/12/2023   BASOSABS 0.1 03/12/2023     Last metabolic panel Lab Results  Component Value Date   NA 139 03/16/2023   K 4.1 03/16/2023   CL 105 03/16/2023   CO2 24 03/16/2023   BUN 35 (H) 03/16/2023   CREATININE 0.92 03/16/2023   GLUCOSE 154 (H) 03/16/2023   GFRNONAA >60 03/16/2023   GFRAA >60 01/24/2018   CALCIUM 8.0 (L) 03/16/2023   PHOS 3.3 03/14/2023   PROT 5.6 (L) 03/11/2023   ALBUMIN 2.2 (L) 03/14/2023   LABGLOB 2.5 03/21/2016   AGRATIO 1.6 03/21/2016   BILITOT 0.8 03/11/2023   ALKPHOS 78 03/11/2023   AST 23 03/11/2023   ALT 39 03/11/2023   ANIONGAP 10 03/16/2023    CBG (last 3)  Recent Labs    03/17/23 1626 03/18/23 0619 03/18/23 1108  GLUCAP 166* 156* 164*       Coagulation Profile: No results for input(s): "INR", "PROTIME" in the last 168 hours.   Radiology Studies: No results found.     Kathlen Mody M.D. [facility] 03/18/2023, 2:38 PM  Available via Epic secure chat 7am-7pm After 7 pm, please refer to night coverage provider listed on amion.

## 2023-03-18 NOTE — Progress Notes (Signed)
NAME:  [name], MRN:  [id number], DOB:  [birth year], LOS: 12 ADMISSION DATE:  03/05/2023, CONSULTATION DATE: 4/8  REFERRING MD:  Daiva Eves CHIEF COMPLAINT:  Generalized Weakness   History of Present Illness:  89 yr old male with significant pmhx of polymyalgia rheumatica on chronic steroids, recent hospitalization for aortitis, CHF, chronic leukocytosis, AAA with S/P stent graft in 2018, HLD, and DDD was admitted with an acute PE at [facility] ED and initially complaining of generalized weakness on 4/7. Patient developed SVT on the morning of 4/8 along with respiratory distress and hypoxia requiring non-re breather mask, found to have PE and DVT. Patient being managed by [facility], Cardiology, and Infectious Disease. PCCM consulted by ID to assist in managing the acute hypoxic respiratory failure.   Pertinent  Medical History   Past Medical History:  Diagnosis Date   AAA (abdominal aortic aneurysm)    Abnormal EKG    Left atrial abnormality   Allergic rhinitis    Allergy    Benign neoplasm of colon 04/14/10   3 small polyps on colonoscopy by Dr. Marina Goodell   Carpal tunnel syndrome    Cataract    Degenerative disc disease    Disturbances metabolism of methionine, homocystine, and cystathionine    Elevated homocysteine   Elevated prostate specific antigen (PSA)    Hearing loss    Hyperlipidemia    Impotence of organic origin    Penile implant   Internal hemorrhoids    Neck pain    Otosclerosis of both ears    Peripheral vascular disease    Bilateral femoral bruit   Plantar fasciitis    Polymyalgia rheumatica    Rotator cuff syndrome of left shoulder    Scoliosis    Shoulder pain      Significant Hospital Events: Including procedures, antibiotic start and stop dates in addition to other pertinent events   4/7 Admitted for Acute PE 4/8 Developed Rapid Atrial Flutter-HR 170s-180s , along with respiratory failure requiring NRB, PCCM consulted  4/12 feeling better, HHFNC 60%, 45L, steroids  tapered 4/15 PMT consult, on  salter HFNC 8-10L,  4/17 Off precedex since last evening, agitation seems to be improving  Interim History / Subjective:   Patient feeling better today Thrus is improved  Objective   Blood pressure 135/78, pulse 88, temperature 97.6 F (36.4 C), temperature source Oral, resp. rate (!) 21, height 6' (1.829 m), weight 67.6 kg, SpO2 97 %.        Intake/Output Summary (Last 24 hours) at 03/18/2023 1224 Last data filed at 03/18/2023 0453 Gross per 24 hour  Intake 200 ml  Output 1350 ml  Net -1150 ml   Filed Weights   03/16/23 1229 03/17/23 0612 03/18/23 0452  Weight: 71.7 kg 69.4 kg 67.6 kg   General:  elderly male, lying in bed in NAD HEENT: [place]/AT, eyes anicteric, thrush present Neuro: awake, alert, moving all extremities CV: S1S2, RRR  PULM:  breathing comfortably on [place], not tachypneic. Crackles present GI: soft, NT Extremities: no LE edema Skin: no rashes, warm & dry  Resolved Hospital Problem list   Hyponatremia  Assessment & Plan:   Acute Hypoxic Respiratory Failure in setting possibly of multifocal pna vs eosinophilic pna/ ILD/ autoimmune process   Acute PE with RLE DVT PMR on chronic prednisone, immunocompromised  Received 3 days of high dose steroids, completed 4/10 Daptomycin stopped 4/7 - sed rate continues to down-trend to 70  P: - con't weaning O2 for SpO2 92% or  higher -Continue  IV solumedrol BID through today and transition to  prednisone daily tomorrow -completing antibiotics for aortitis- would cover bacterial pneumonias -PJP prophylaxis on steroids-- Bactrim MWF -DNI  Acute hyperactive delirium/ anxiety -Per primary - on seroquel and trazodone  Thrush - start nystatin 4 times daily 4/20   Pulmonology will continue to follow  Melody Comas, MD [facility] Office: [phone]   See Amion for personal pager PCCM on call pager [phone] until 7pm. Please call Elink 7p-7a.  [phone]

## 2023-03-18 NOTE — Discharge Instructions (Addendum)
Information on my medicine - ELIQUIS (apixaban)  This medication education was reviewed with me or my healthcare representative as part of my discharge preparation.  Why was Eliquis prescribed for you? Eliquis was prescribed to treat blood clots that may have been found in the veins of your legs (deep vein thrombosis) or in your lungs (pulmonary embolism) and to reduce the risk of them occurring again.  What do You need to know about Eliquis ? The starting dose is 10 mg (two 5 mg tablets) taken TWICE daily for the FIRST SEVEN (7) DAYS, then on (enter date)  03/14/23  the dose is reduced to ONE 5 mg tablet taken TWICE daily.  Eliquis may be taken with or without food.   Try to take the dose about the same time in the morning and in the evening. If you have difficulty swallowing the tablet whole please discuss with your pharmacist how to take the medication safely.  Take Eliquis exactly as prescribed and DO NOT stop taking Eliquis without talking to the doctor who prescribed the medication.  Stopping may increase your risk of developing a new blood clot.  Refill your prescription before you run out.  After discharge, you should have regular check-up appointments with your healthcare provider that is prescribing your Eliquis.    What do you do if you miss a dose? If a dose of ELIQUIS is not taken at the scheduled time, take it as soon as possible on the same day and twice-daily administration should be resumed. The dose should not be doubled to make up for a missed dose.  Important Safety Information A possible side effect of Eliquis is bleeding. You should call your healthcare provider right away if you experience any of the following: Bleeding from an injury or your nose that does not stop. Unusual colored urine (red or dark brown) or unusual colored stools (red or black). Unusual bruising for unknown reasons. A serious fall or if you hit your head (even if there is no  bleeding).  Some medicines may interact with Eliquis and might increase your risk of bleeding or clotting while on Eliquis. To help avoid this, consult your healthcare provider or pharmacist prior to using any new prescription or non-prescription medications, including herbals, vitamins, non-steroidal anti-inflammatory drugs (NSAIDs) and supplements.  This website has more information on Eliquis (apixaban): http://www.eliquis.com/eliquis/home

## 2023-03-19 DIAGNOSIS — I4892 Unspecified atrial flutter: Secondary | ICD-10-CM | POA: Diagnosis not present

## 2023-03-19 DIAGNOSIS — I2699 Other pulmonary embolism without acute cor pulmonale: Secondary | ICD-10-CM | POA: Diagnosis not present

## 2023-03-19 DIAGNOSIS — R531 Weakness: Secondary | ICD-10-CM | POA: Diagnosis not present

## 2023-03-19 DIAGNOSIS — J9601 Acute respiratory failure with hypoxia: Secondary | ICD-10-CM | POA: Diagnosis not present

## 2023-03-19 DIAGNOSIS — J8282 Acute eosinophilic pneumonia: Secondary | ICD-10-CM | POA: Diagnosis not present

## 2023-03-19 LAB — GLUCOSE, CAPILLARY
Glucose-Capillary: 161 mg/dL — ABNORMAL HIGH (ref 70–99)
Glucose-Capillary: 163 mg/dL — ABNORMAL HIGH (ref 70–99)
Glucose-Capillary: 175 mg/dL — ABNORMAL HIGH (ref 70–99)
Glucose-Capillary: 152 mg/dL — ABNORMAL HIGH (ref 70–99)

## 2023-03-19 LAB — BASIC METABOLIC PANEL
Anion gap: 9 (ref 5–15)
BUN: 34 mg/dL — ABNORMAL HIGH (ref 8–23)
CO2: 29 mmol/L (ref 22–32)
Calcium: 8.5 mg/dL — ABNORMAL LOW (ref 8.9–10.3)
Chloride: 101 mmol/L (ref 98–111)
Creatinine, Ser: 0.8 mg/dL (ref 0.61–1.24)
GFR, Estimated: >60 mL/min (ref >60)
Glucose, Bld: 161 mg/dL — ABNORMAL HIGH (ref 70–99)
Potassium: 4.2 mmol/L (ref 3.5–5.1)
Sodium: 139 mmol/L (ref 135–145)

## 2023-03-19 LAB — CBC WITH DIFFERENTIAL/PLATELET
Abs Immature Granulocytes: 0 10*3/uL (ref 0.00–0.07)
Basophils Absolute: 0 10*3/uL (ref 0.0–0.1)
Basophils Relative: 0 %
Eosinophils Absolute: 0 10*3/uL (ref 0.0–0.5)
Eosinophils Relative: 0 %
HCT: 31.3 % — ABNORMAL LOW (ref 39.0–52.0)
Hemoglobin: 10.1 g/dL — ABNORMAL LOW (ref 13.0–17.0)
Lymphocytes Relative: 0 %
Lymphs Abs: 0 10*3/uL — ABNORMAL LOW (ref 0.7–4.0)
MCH: 31.3 pg (ref 26.0–34.0)
MCHC: 32.3 g/dL (ref 30.0–36.0)
MCV: 96.9 fL (ref 80.0–100.0)
Monocytes Absolute: 1.3 10*3/uL — ABNORMAL HIGH (ref 0.1–1.0)
Monocytes Relative: 4 %
Neutro Abs: 31.9 10*3/uL — ABNORMAL HIGH (ref 1.7–7.7)
Neutrophils Relative %: 96 %
Platelets: 440 10*3/uL — ABNORMAL HIGH (ref 150–400)
RBC: 3.23 MIL/uL — ABNORMAL LOW (ref 4.22–5.81)
RDW: 16.1 % — ABNORMAL HIGH (ref 11.5–15.5)
WBC: 33.2 10*3/uL — ABNORMAL HIGH (ref 4.0–10.5)
nRBC: 0 % (ref 0.0–0.2)
nRBC: 0 /100 WBC

## 2023-03-19 MED ORDER — HYDROXYZINE HCL 25 MG PO TABS
25.0000 mg | ORAL_TABLET | Freq: Four times a day (QID) | ORAL | Status: DC
  Administered 2023-03-19 – 2023-03-21 (×7): 25 mg via ORAL
  Filled 2023-03-19 (×8): qty 1

## 2023-03-19 NOTE — Progress Notes (Addendum)
PT alert and oriented. No s/s distress. Education provided for medications before administration. PT encouraged to eat and drink. Son at bedside, all questions answered as asked. Denies pain.   Attempted to weak O2, O2 @ 2 lpm dropped to 89%.  Increased to 3 lpm, remained at 91%.  Currently at 3.5 lpm, spo2 97%.

## 2023-03-19 NOTE — Progress Notes (Signed)
MD, wife does not want pt to take Crestor, due to reaction with antibiotics causing shoulder pain, please address, thanks, Lavonda Jumbo RN .

## 2023-03-19 NOTE — Plan of Care (Signed)
  Problem: Education: Goal: Knowledge of General Education information will improve Description: Including pain rating scale, medication(s)/side effects and non-pharmacologic comfort measures Outcome: Progressing   Problem: Health Behavior/Discharge Planning: Goal: Ability to manage health-related needs will improve Outcome: Progressing   Problem: Clinical Measurements: Goal: Ability to maintain clinical measurements within normal limits will improve Outcome: Progressing Goal: Will remain free from infection Outcome: Progressing Goal: Diagnostic test results will improve Outcome: Progressing Goal: Respiratory complications will improve Outcome: Progressing Goal: Cardiovascular complication will be avoided Outcome: Progressing   Problem: Activity: Goal: Risk for activity intolerance will decrease Outcome: Progressing   Problem: Nutrition: Goal: Adequate nutrition will be maintained Outcome: Progressing   Problem: Coping: Goal: Level of anxiety will decrease Outcome: Progressing   Problem: Elimination: Goal: Will not experience complications related to bowel motility Outcome: Progressing Goal: Will not experience complications related to urinary retention Outcome: Progressing   Problem: Safety: Goal: Ability to remain free from injury will improve Outcome: Progressing   Problem: Skin Integrity: Goal: Risk for impaired skin integrity will decrease Outcome: Progressing   Problem: Education: Goal: Ability to describe self-care measures that may prevent or decrease complications (Diabetes Survival Skills Education) will improve Outcome: Progressing Goal: Individualized Educational Video(s) Outcome: Progressing   Problem: Coping: Goal: Ability to adjust to condition or change in health will improve Outcome: Progressing   Problem: Fluid Volume: Goal: Ability to maintain a balanced intake and output will improve Outcome: Progressing   Problem: Health  Behavior/Discharge Planning: Goal: Ability to identify and utilize available resources and services will improve Outcome: Progressing Goal: Ability to manage health-related needs will improve Outcome: Progressing   Problem: Metabolic: Goal: Ability to maintain appropriate glucose levels will improve Outcome: Progressing   Problem: Nutritional: Goal: Maintenance of adequate nutrition will improve Outcome: Progressing Goal: Progress toward achieving an optimal weight will improve Outcome: Progressing   Problem: Skin Integrity: Goal: Risk for impaired skin integrity will decrease Outcome: Progressing   Problem: Tissue Perfusion: Goal: Adequacy of tissue perfusion will improve Outcome: Progressing   

## 2023-03-19 NOTE — Progress Notes (Signed)
[facility]                                                                               [name], is a 89 y.o. male, DOB - 03-Oct-1930, ZOX:[id number] Admit date - 03/05/2023    Outpatient Primary MD for the patient is [name], MD  LOS - 13  days    Brief summary   89 yr old male with significant pmhx of polymyalgia rheumatica on chronic steroids, recent hospitalization for aortitis, CHF, chronic leukocytosis, AAA with S/P stent graft in 2018, HLD, and DDD was admitted with an acute PE at [facility] ED and initially complaining of generalized weakness on 4/7. Patient with worsening respiratory distress and hypoxia on 4/8/ requiring non-re breather mask, found to have PE and DVT. Patient being managed by [facility], Cardiology, and Infectious Disease. PCCM consulted by ID to assist in managing the acute hypoxic respiratory failure.      Assessment & Plan    Assessment and Plan:  Acute hypoxic respiratory failure in the setting of multifocal pneumonia versus eosinophilic pneumonia from daptomycin versus interstitial lung disease/autoimmune process. Acute pulmonary embolism with right lower extremity DVT in the setting of PMR on chronic steroids, immunocompromise Daptomycin was stopped on 4/7. He was started on high-dose steroids and currently on 40 mg of Solu-Medrol every 12 hours Initially required high flow nasal cannula oxygen currently on 4 L of nasal cannula oxygen. ID on board recommended to continue with IV ceftriaxone, oral doxycycline, Bactrim for PJP prophylaxis. Patient reports nausea with doxycycline, better with zofran. He reports not having any appetite. He remains on 4 lit of [place] oxygen.  Physical therapy.   Paroxysmal atrial flutter/fibrillation Rate controlled with metoprolol 12.5 mg twice daily and on Eliquis for anticoagulation. Echo shows left ventricular ejection fraction of 55 to 60% with no regional wall abnormalities.  Left ventricular diastolic  parameters are normal   Pulmonary embolism and right lower extremity DVT On Eliquis for anticoagulation. He remains on 4 lit of [place] oxygen. Wean him off oxygen .   Chest pain yesterday, which appears to have resolved.  Elevated troponins. Demand ischemia from respiratory illness? Cardiology consulted by PCCM.  Currently chest pain free.   AAA s/p sciatic graft and possible aortitis Patient was on daptomycin per ID at [facility] which was discontinued for concern for of eosinophilic pneumonia Patient currently on IV ceftriaxone, doxycycline and Bactrim for 4 more days to complete the course.    Acute delirium/anxiety Multifactorial possibly hospital delirium versus from hypoxia versus from sepsis versus from steroids Initially required Precedex gtt, has been weaned off and currently on Seroquel, Atarax and trazodone.    Hyperlipidemia:  On statin/ Crestor 5 mg daily, family does not want the crestor. Will d/c crestor today.    PMR:  Pt is on 5 mg of prednisone at home.   BPH   Anemia of chronic disease:  Baseline hemoglobin around 11. Currently at 10.5  continue to monitor.    Chronic leukocytosis:  Recommend outpatient follow up with hematology.    GERD: On PPI/ Pepcid. Zofran.   Nausea with doxycycline,  On zofran.    Oral Thrush:  Nystatin  suspension and oral diflucan ordered.  Improving.   Pressure injury not present on admission.  RN Pressure Injury Documentation: Pressure Injury 03/12/23 Sacrum Mid Stage 1 -  Intact skin with non-blanchable redness of a localized area usually over a bony prominence. Pink non blanching on sacrum (Active)  03/12/23 0800  Location: Sacrum  Location Orientation: Mid  Staging: Stage 1 -  Intact skin with non-blanchable redness of a localized area usually over a bony prominence.  Wound Description (Comments): Pink non blanching on sacrum  Present on Admission: No  Dressing Type Foam - Lift dressing to assess site every shift  03/18/23 1940  Foam dressing in place.   Estimated body mass index is 20.96 kg/m as calculated from the following:   Height as of this encounter: 6' (1.829 m).   Weight as of this encounter: 70.1 kg.  Code Status: DNR DVT Prophylaxis:   apixaban (ELIQUIS) tablet 5 mg   Level of Care: Level of care: Telemetry Cardiac Family Communication: discussed the plan with the son at bedside.   Disposition Plan:     Remains inpatient appropriate: IV ceftriaxone, IV steroids.   Procedures:  Echocardiogram.   Consultants:   ID PALLIATIVE CARE PCCM  Significant events  4/7 Admitted for Acute PE 4/8 Developed Rapid Atrial Flutter-HR 170s-180s , along with respiratory failure requiring NRB, PCCM consulted  4/12 feeling better, HHFNC 60%, 45L, steroids tapered 4/15 PMT consult, on  salter HFNC 8-10L,  4/17 Off precedex since last evening, agitation seems to be improving 4/18 transfer to [facility] from PCCM.   Antimicrobials:   Anti-infectives (From admission, onward)    Start     Dose/Rate Route Frequency Ordered Stop   03/17/23 1500  fluconazole (DIFLUCAN) tablet 200 mg        200 mg Oral  Once 03/17/23 1403 03/17/23 1540   03/15/23 2200  doxycycline (VIBRA-TABS) tablet 100 mg        100 mg Oral Every 12 hours 03/15/23 1109 03/23/23 2359   03/15/23 1400  cefTRIAXone (ROCEPHIN) 2 g in sodium chloride 0.9 % 100 mL IVPB        2 g 200 mL/hr over 30 Minutes Intravenous Daily 03/15/23 1109 03/23/23 2359   03/13/23 2200  vancomycin (VANCOREADY) IVPB 750 mg/150 mL  Status:  Discontinued        750 mg 150 mL/hr over 60 Minutes Intravenous Every 12 hours 03/13/23 1131 03/15/23 1109   03/10/23 2300  vancomycin (VANCOREADY) IVPB 1250 mg/250 mL  Status:  Discontinued        1,250 mg 166.7 mL/hr over 90 Minutes Intravenous Every 18 hours 03/10/23 2243 03/13/23 1131   03/10/23 2000  piperacillin-tazobactam (ZOSYN) IVPB 3.375 g  Status:  Discontinued        3.375 g 12.5 mL/hr over 240 Minutes  Intravenous Every 8 hours 03/10/23 1140 03/15/23 1109   03/06/23 1715  vancomycin (VANCOREADY) IVPB 1500 mg/300 mL  Status:  Discontinued        1,500 mg 150 mL/hr over 120 Minutes Intravenous  Once 03/06/23 1628 03/06/23 1628   03/06/23 1628  vancomycin (VANCOREADY) IVPB 1250 mg/250 mL  Status:  Discontinued        1,250 mg 166.7 mL/hr over 90 Minutes Intravenous Every 24 hours 03/06/23 1628 03/10/23 2243   03/06/23 0900  sulfamethoxazole-trimethoprim (BACTRIM DS) 800-160 MG per tablet 1 tablet        1 tablet Oral Once per day on Mon Wed Fri 03/05/23 2141  03/06/23 0800  ceFEPIme (MAXIPIME) 2 g in sodium chloride 0.9 % 100 mL IVPB  Status:  Discontinued        2 g 200 mL/hr over 30 Minutes Intravenous Every 12 hours 03/05/23 2140 03/10/23 1140   03/05/23 1845  DAPTOmycin (CUBICIN) 600 mg in sodium chloride 0.9 % IVPB  Status:  Discontinued        600 mg 124 mL/hr over 30 Minutes Intravenous Daily 03/05/23 1833 03/06/23 1546   03/05/23 1645  ceFEPIme (MAXIPIME) 2 g in sodium chloride 0.9 % 100 mL IVPB        2 g 200 mL/hr over 30 Minutes Intravenous  Once 03/05/23 1639 03/05/23 2018   03/05/23 1645  vancomycin (VANCOREADY) IVPB 1500 mg/300 mL  Status:  Discontinued        1,500 mg 150 mL/hr over 120 Minutes Intravenous  Once 03/05/23 1639 03/05/23 1645        Medications  Scheduled Meds:  apixaban  5 mg Oral BID   aspirin  81 mg Oral Daily   Chlorhexidine Gluconate Cloth  6 each Topical Daily   doxycycline  100 mg Oral Q12H   famotidine  20 mg Oral Daily   feeding supplement  237 mL Oral TID BM   fluorometholone  1 drop Right Eye QHS   folic acid  1 mg Oral Daily   hydrOXYzine  25 mg Oral Q6H   insulin aspart  0-15 Units Subcutaneous TID WC   melatonin  3 mg Oral QHS   metoprolol tartrate  12.5 mg Oral BID   nystatin  5 mL Oral QID   pantoprazole  40 mg Oral BID   predniSONE  40 mg Oral Q breakfast   QUEtiapine  12.5 mg Oral Daily   QUEtiapine  50 mg Oral QHS    senna-docusate  1 tablet Oral BID   sodium chloride flush  10-40 mL Intracatheter Q12H   sulfamethoxazole-trimethoprim  1 tablet Oral Once per day on Mon Wed Fri   traZODone  200 mg Oral QHS   Continuous Infusions:  sodium chloride Stopped (03/10/23 1127)   cefTRIAXone (ROCEPHIN)  IV 2 g (03/19/23 1016)   PRN Meds:.sodium chloride, acetaminophen **OR** acetaminophen, alum & mag hydroxide-simeth, dicyclomine, hydrALAZINE, lidocaine, menthol-cetylpyridinium, ondansetron (ZOFRAN) IV, mouth rinse, sodium chloride flush    Subjective:   [name] was seen and examined today.  No new complaints today. Still very sleepy.   Objective:   Vitals:   03/19/23 0552 03/19/23 0725 03/19/23 1005 03/19/23 1107  BP: (!) 163/98 131/85 123/70 139/89  Pulse: 83 85 93 89  Resp: Temp: 97.6 F (36.4 C) 97.7 F (36.5 C)    TempSrc: Oral     SpO2: 95% 91%  97%  Weight: 70.1 kg     Height:        Intake/Output Summary (Last 24 hours) at 03/19/2023 1322 Last data filed at 03/19/2023 1014 Gross per 24 hour  Intake 210 ml  Output 1050 ml  Net -840 ml    Filed Weights   03/17/23 0612 03/18/23 0452 03/19/23 0552  Weight: 69.4 kg 67.6 kg 70.1 kg     Exam General exam: ill appearing gentleman, not in distress.  Respiratory system: diminished at bases.  Cardiovascular system: S1 & S2 heard, RRR. No JVD,  Gastrointestinal system: Abdomen is nondistended, soft and nontender. Central nervous system: Alert and oriented to plae and person only.  Extremities: Symmetric 5 x 5 power. Skin: No  rashes, Psychiatry: flat affect.       Data Reviewed:  I have personally reviewed following labs and imaging studies   CBC Lab Results  Component Value Date   WBC 33.2 (H) 03/19/2023   RBC 3.23 (L) 03/19/2023   HGB 10.1 (L) 03/19/2023   HCT 31.3 (L) 03/19/2023   MCV 96.9 03/19/2023   MCH 31.3 03/19/2023   PLT 440 (H) 03/19/2023   MCHC 32.3 03/19/2023   RDW 16.1 (H) 03/19/2023    LYMPHSABS 0.0 (L) 03/19/2023   MONOABS 1.3 (H) 03/19/2023   EOSABS 0.0 03/19/2023   BASOSABS 0.0 03/19/2023     Last metabolic panel Lab Results  Component Value Date   NA 139 03/19/2023   K 4.2 03/19/2023   CL 101 03/19/2023   CO2 29 03/19/2023   BUN 34 (H) 03/19/2023   CREATININE 0.80 03/19/2023   GLUCOSE 161 (H) 03/19/2023   GFRNONAA >60 03/19/2023   GFRAA >60 01/24/2018   CALCIUM 8.5 (L) 03/19/2023   PHOS 3.3 03/14/2023   PROT 5.6 (L) 03/11/2023   ALBUMIN 2.2 (L) 03/14/2023   LABGLOB 2.5 03/21/2016   AGRATIO 1.6 03/21/2016   BILITOT 0.8 03/11/2023   ALKPHOS 78 03/11/2023   AST 23 03/11/2023   ALT 39 03/11/2023   ANIONGAP 9 03/19/2023    CBG (last 3)  Recent Labs    03/18/23 2051 03/19/23 0557 03/19/23 1105  GLUCAP 165* 161* 163*       Coagulation Profile: No results for input(s): "INR", "PROTIME" in the last 168 hours.   Radiology Studies: No results found.     Kathlen Mody M.D. [facility] 03/19/2023, 1:22 PM  Available via Epic secure chat 7am-7pm After 7 pm, please refer to night coverage provider listed on amion.

## 2023-03-19 NOTE — Plan of Care (Signed)
  Problem: Pain Managment: Goal: General experience of comfort will improve Outcome: Completed/Met   

## 2023-03-19 NOTE — Progress Notes (Signed)
NAME:  [name], MRN:  [id number], DOB:  08-Oct-1930, LOS: 13 ADMISSION DATE:  03/05/2023, CONSULTATION DATE: 4/8  REFERRING MD:  Daiva Eves CHIEF COMPLAINT:  Generalized Weakness   History of Present Illness:  89 yr old male with significant pmhx of polymyalgia rheumatica on chronic steroids, recent hospitalization for aortitis, CHF, chronic leukocytosis, AAA with S/P stent graft in 2018, HLD, and DDD was admitted with an acute PE at [facility] ED and initially complaining of generalized weakness on 4/7. Patient developed SVT on the morning of 4/8 along with respiratory distress and hypoxia requiring non-re breather mask, found to have PE and DVT. Patient being managed by [facility] Services, Cardiology, and Infectious Disease. PCCM consulted by ID to assist in managing the acute hypoxic respiratory failure.   Pertinent  Medical History   Past Medical History:  Diagnosis Date   AAA (abdominal aortic aneurysm)    Abnormal EKG    Left atrial abnormality   Allergic rhinitis    Allergy    Benign neoplasm of colon 04/14/10   3 small polyps on colonoscopy by Dr. Marina Goodell   Carpal tunnel syndrome    Cataract    Degenerative disc disease    Disturbances metabolism of methionine, homocystine, and cystathionine    Elevated homocysteine   Elevated prostate specific antigen (PSA)    Hearing loss    Hyperlipidemia    Impotence of organic origin    Penile implant   Internal hemorrhoids    Neck pain    Otosclerosis of both ears    Peripheral vascular disease    Bilateral femoral bruit   Plantar fasciitis    Polymyalgia rheumatica    Rotator cuff syndrome of left shoulder    Scoliosis    Shoulder pain      Significant Hospital Events: Including procedures, antibiotic start and stop dates in addition to other pertinent events   4/7 Admitted for Acute PE 4/8 Developed Rapid Atrial Flutter-HR 170s-180s , along with respiratory failure requiring NRB, PCCM consulted  4/12 feeling better, HHFNC 60%, 45L, steroids  tapered 4/15 PMT consult, on  salter HFNC 8-10L,  4/17 Off precedex since last evening, agitation seems to be improving 4/18 8L HFNC 4/20 5L [place]  Interim History / Subjective:   Patient feeling overall tired without motivation to move or eat much Family updated at bedside, wife and son Turned oxygen down to 3L and he maintained SpO2 greater than 92%  Objective   Blood pressure 124/78, pulse 87, temperature 98.1 F (36.7 C), temperature source Oral, resp. rate 17, height 6' (1.829 m), weight 70.1 kg, SpO2 96 %.        Intake/Output Summary (Last 24 hours) at 03/19/2023 1722 Last data filed at 03/19/2023 1400 Gross per 24 hour  Intake 450 ml  Output 1250 ml  Net -800 ml   Filed Weights   03/17/23 0612 03/18/23 0452 03/19/23 0552  Weight: 69.4 kg 67.6 kg 70.1 kg   General:  elderly male, lying in bed in NAD HEENT: [place]/AT, eyes anicteric, thrush present Neuro: awake, alert, moving all extremities CV: S1S2, RRR  PULM:  breathing comfortably on [place], not tachypneic. Crackles present GI: soft, NT Extremities: no LE edema Skin: no rashes, warm & dry  Resolved Hospital Problem list   Hyponatremia  Assessment & Plan:   Acute Hypoxic Respiratory Failure in setting possibly of multifocal pna vs eosinophilic pna/ ILD/ autoimmune process   Acute PE with RLE DVT PMR on chronic prednisone, immunocompromised  Received 3  days of high dose steroids, completed 4/10 Daptomycin stopped 4/7 - sed rate continues to down-trend to 70  P: - con't weaning O2 for SpO2 92% or higher -Continue  prednisone daily tomorrow, will plan for prolonged taper -completing antibiotics for aortitis- would cover bacterial pneumonias -PJP prophylaxis on steroids-- Bactrim MWF -DNI  Acute hyperactive delirium/ anxiety -Per primary - on seroquel and trazodone  Thrush - start nystatin 4 times daily 4/20   Pulmonology will continue to follow  Melody Comas, MD [facility] Office: [phone]   See Amion for personal pager PCCM on call pager [phone] until 7pm. Please call Elink 7p-7a. [phone]

## 2023-03-20 ENCOUNTER — Telehealth: Payer: Self-pay | Admitting: Pulmonary Disease

## 2023-03-20 ENCOUNTER — Inpatient Hospital Stay (HOSPITAL_COMMUNITY): Payer: Medicare Other

## 2023-03-20 DIAGNOSIS — J8282 Acute eosinophilic pneumonia: Secondary | ICD-10-CM | POA: Diagnosis not present

## 2023-03-20 DIAGNOSIS — I776 Arteritis, unspecified: Secondary | ICD-10-CM | POA: Diagnosis not present

## 2023-03-20 DIAGNOSIS — G9341 Metabolic encephalopathy: Secondary | ICD-10-CM | POA: Diagnosis not present

## 2023-03-20 DIAGNOSIS — R531 Weakness: Secondary | ICD-10-CM | POA: Diagnosis not present

## 2023-03-20 DIAGNOSIS — J9601 Acute respiratory failure with hypoxia: Secondary | ICD-10-CM | POA: Diagnosis not present

## 2023-03-20 DIAGNOSIS — I4892 Unspecified atrial flutter: Secondary | ICD-10-CM | POA: Diagnosis not present

## 2023-03-20 DIAGNOSIS — I2699 Other pulmonary embolism without acute cor pulmonale: Secondary | ICD-10-CM | POA: Diagnosis not present

## 2023-03-20 LAB — CBC WITH DIFFERENTIAL/PLATELET
Abs Immature Granulocytes: 0 10*3/uL (ref 0.00–0.07)
Basophils Absolute: 0 10*3/uL (ref 0.0–0.1)
Basophils Relative: 0 %
Eosinophils Absolute: 0 10*3/uL (ref 0.0–0.5)
Eosinophils Relative: 0 %
HCT: 32.4 % — ABNORMAL LOW (ref 39.0–52.0)
Hemoglobin: 10.7 g/dL — ABNORMAL LOW (ref 13.0–17.0)
Lymphocytes Relative: 2 %
Lymphs Abs: 0.6 10*3/uL — ABNORMAL LOW (ref 0.7–4.0)
MCH: 31.4 pg (ref 26.0–34.0)
MCHC: 33 g/dL (ref 30.0–36.0)
MCV: 95 fL (ref 80.0–100.0)
Monocytes Absolute: 0.6 10*3/uL (ref 0.1–1.0)
Monocytes Relative: 2 %
Neutro Abs: 28.8 10*3/uL — ABNORMAL HIGH (ref 1.7–7.7)
Neutrophils Relative %: 96 %
Platelets: 445 10*3/uL — ABNORMAL HIGH (ref 150–400)
RBC: 3.41 MIL/uL — ABNORMAL LOW (ref 4.22–5.81)
RDW: 16.1 % — ABNORMAL HIGH (ref 11.5–15.5)
WBC: 30 10*3/uL — ABNORMAL HIGH (ref 4.0–10.5)
nRBC: 0 % (ref 0.0–0.2)
nRBC: 0 /100 WBC

## 2023-03-20 LAB — GLUCOSE, CAPILLARY
Glucose-Capillary: 115 mg/dL — ABNORMAL HIGH (ref 70–99)
Glucose-Capillary: 126 mg/dL — ABNORMAL HIGH (ref 70–99)
Glucose-Capillary: 154 mg/dL — ABNORMAL HIGH (ref 70–99)
Glucose-Capillary: 144 mg/dL — ABNORMAL HIGH (ref 70–99)

## 2023-03-20 MED ORDER — LOPERAMIDE HCL 2 MG PO CAPS
2.0000 mg | ORAL_CAPSULE | ORAL | Status: DC | PRN
  Administered 2023-03-20 (×2): 2 mg via ORAL
  Filled 2023-03-20 (×2): qty 1

## 2023-03-20 NOTE — Progress Notes (Signed)
Physical Therapy Treatment Patient Details Name: [name] MRN: [id number] DOB: [birth year] Today's Date: 03/20/2023   History of Present Illness 89 yr old male  was admitted with an acute PE at [facility] ED and initially complaining of generalized weakness on 4/7. Patient developed SVT on the morning of 4/8 along with respiratory distress and hypoxia requiring non-re breather mask. [id number] rheumatica on chronic steroids, recent hospitalization for aortitis, CHF, chronic leukocytosis, AAA with S/P stent graft in 2018, HLD, and DDD    PT Comments    Pt with poor tolerance to treatment today. Pt was able to sit EOB briefly with Min/Mod A however quickly laid back down on his own stating that he was too weak and wanted to try again tomorrow. Pt also required Total A for pericare. Spoke with pt wife and they are now agreeable to SNF at DC as wife states that she feels that she cannot provide current level of assist. DC recs have been updated. PT will continue to follow.   Recommendations for follow up therapy are one component of a multi-disciplinary discharge planning process, led by the attending physician.  Recommendations may be updated based on patient status, additional functional criteria and insurance authorization.  Follow Up Recommendations  Can patient physically be transported by private vehicle: No    Assistance Recommended at Discharge Frequent or constant Supervision/Assistance  Patient can return home with the following Assistance with cooking/housework;Direct supervision/assist for medications management;Direct supervision/assist for financial management;Assist for transportation;Help with stairs or ramp for entrance;A lot of help with walking and/or transfers;A lot of help with bathing/dressing/bathroom   Equipment Recommendations  Other (comment) (Per accepting facility)    Recommendations for Other Services       Precautions / Restrictions Precautions Precautions:  Fall;Other (comment) Precaution Comments: watch sats and BP Restrictions Weight Bearing Restrictions: No     Mobility  Bed Mobility Overal bed mobility: Needs Assistance Bed Mobility: Supine to Sit, Sit to Supine, Rolling Rolling: Max assist   Supine to sit: Min assist, Mod assist Sit to supine: Supervision   General bed mobility comments: pt able to advance LEs to EOB, min assist to raise trunk and bring R hip for feet to be on the floor, pt able to return to supine on his own    Transfers                   General transfer comment: pt declined    Ambulation/Gait                   Stairs             Wheelchair Mobility    Modified Rankin (Stroke Patients Only)       Balance Overall balance assessment: Needs assistance Sitting-balance support: Feet supported, No upper extremity supported Sitting balance-Leahy Scale: Fair Sitting balance - Comments: EOB with tendency for right lean, very flexed cervical spine Postural control: Right lateral lean                                  Cognition Arousal/Alertness: Awake/alert Behavior During Therapy: WFL for tasks assessed/performed Overall Cognitive Status: Within Functional Limits for tasks assessed                                          Exercises  General Comments General comments (skin integrity, edema, etc.): Pt received on 2L at 96%. Pt briefly dropped to 86% on 2L once seated EOB. Pt was able to recover to 96% on 2L with pursed lip breathing after ~3 minutes.      Pertinent Vitals/Pain Pain Assessment Pain Assessment: No/denies pain    Home Living                          Prior Function            PT Goals (current goals can now be found in the care plan section) Progress towards PT goals: Progressing toward goals    Frequency    Min 1X/week      PT Plan Discharge plan needs to be updated    Co-evaluation               AM-PAC PT "6 Clicks" Mobility   Outcome Measure  Help needed turning from your back to your side while in a flat bed without using bedrails?: A Lot Help needed moving from lying on your back to sitting on the side of a flat bed without using bedrails?: A Lot Help needed moving to and from a bed to a chair (including a wheelchair)?: Total Help needed standing up from a chair using your arms (e.g., wheelchair or bedside chair)?: Total Help needed to walk in hospital room?: Total Help needed climbing 3-5 steps with a railing? : Total 6 Click Score: 8    End of Session Equipment Utilized During Treatment: Oxygen Activity Tolerance: Patient limited by fatigue Patient left: in bed;with call bell/phone within reach;with family/visitor present Nurse Communication: Mobility status PT Visit Diagnosis: Difficulty in walking, not elsewhere classified (R26.2);Other abnormalities of gait and mobility (R26.89);Unsteadiness on feet (R26.81)     Time: 1610-9604 PT Time Calculation (min) (ACUTE ONLY): 36 min  Charges:  $Therapeutic Activity: 23-37 mins                     Shela Nevin, PT, DPT Acute Rehab Services [phone]    [name] 03/20/2023, 4:21 PM

## 2023-03-20 NOTE — Telephone Encounter (Signed)
Please schedule patient for hospital follow up with me in 3-4 weeks for organizing pneumonia.  Thanks, JD

## 2023-03-20 NOTE — Progress Notes (Signed)
NAME:  [name], MRN:  [id number], DOB:  [birth year], LOS: 14 ADMISSION DATE:  03/05/2023, CONSULTATION DATE: 4/8  REFERRING MD:  Daiva Eves CHIEF COMPLAINT:  Generalized Weakness   History of Present Illness:  89 yr old male with significant pmhx of polymyalgia rheumatica on chronic steroids, recent hospitalization for aortitis, CHF, chronic leukocytosis, AAA with S/P stent graft in 2018, HLD, and DDD was admitted with an acute PE at [facility] ED and initially complaining of generalized weakness on 4/7. Patient developed SVT on the morning of 4/8 along with respiratory distress and hypoxia requiring non-re breather mask, found to have PE and DVT. Patient being managed by [facility], Cardiology, and Infectious Disease. PCCM consulted by ID to assist in managing the acute hypoxic respiratory failure.   Pertinent  Medical History   Past Medical History:  Diagnosis Date   AAA (abdominal aortic aneurysm)    Abnormal EKG    Left atrial abnormality   Allergic rhinitis    Allergy    Benign neoplasm of colon 04/14/10   3 small polyps on colonoscopy by Dr. Marina Goodell   Carpal tunnel syndrome    Cataract    Degenerative disc disease    Disturbances metabolism of methionine, homocystine, and cystathionine    Elevated homocysteine   Elevated prostate specific antigen (PSA)    Hearing loss    Hyperlipidemia    Impotence of organic origin    Penile implant   Internal hemorrhoids    Neck pain    Otosclerosis of both ears    Peripheral vascular disease    Bilateral femoral bruit   Plantar fasciitis    Polymyalgia rheumatica    Rotator cuff syndrome of left shoulder    Scoliosis    Shoulder pain      Significant Hospital Events: Including procedures, antibiotic start and stop dates in addition to other pertinent events   4/7 Admitted for Acute PE 4/8 Developed Rapid Atrial Flutter-HR 170s-180s , along with respiratory failure requiring NRB, PCCM consulted  4/12 feeling better, HHFNC 60%, 45L, steroids  tapered 4/15 PMT consult, on  salter HFNC 8-10L,  4/17 Off precedex since last evening, agitation seems to be improving 4/18 8L HFNC 4/20 5L [place] 4/21 weaned to 3L [place]  Interim History / Subjective:   Patient continues feeling overall tired without motivation to move or eat much Family updated at bedside, wife  Turned oxygen down to 2L and he maintained SpO2 greater than 92% at rest  Objective   Blood pressure 136/81, pulse 88, temperature 98 F (36.7 C), resp. rate 19, height 6' (1.829 m), weight 67.4 kg, SpO2 97 %.        Intake/Output Summary (Last 24 hours) at 03/20/2023 0953 Last data filed at 03/20/2023 0600 Gross per 24 hour  Intake 450 ml  Output 1650 ml  Net -1200 ml   Filed Weights   03/18/23 0452 03/19/23 0552 03/20/23 0615  Weight: 67.6 kg 70.1 kg 67.4 kg   General:  elderly male, lying in bed in NAD HEENT: [place]/AT, eyes anicteric, thrush present Neuro: awake, alert, moving all extremities CV: S1S2, RRR  PULM:  breathing comfortably on [place], not tachypneic. Crackles at base present GI: soft, NT Extremities: no LE edema Skin: no rashes, warm & dry  Resolved Hospital Problem list   Hyponatremia  Assessment & Plan:   Acute Hypoxic Respiratory Failure in setting possibly of multifocal pna vs eosinophilic pna/ ILD/ autoimmune process   Acute PE with RLE DVT PMR on  chronic prednisone, immunocompromised  Received 3 days of high dose steroids, completed 4/10 Daptomycin stopped 4/7 - sed rate continues to down-trend to 70  P: - con't weaning O2 for SpO2 92% or higher -Continue  prednisone daily until seen in follow up in 3-4 weeks in pulmonary clinic - Continue Bactrim DS prophylaxis, 1 tab MWF -DNI  Acute hyperactive delirium/ anxiety -Per primary - on seroquel and trazodone  Thrush - start nystatin 4 times daily 4/20, plan for 7 days   Pulmonology will sign off. Hospital follow up will be arranged in 3-4 weeks.  Melody Comas, MD [facility] Office: [phone]   See Amion for personal pager PCCM on call pager [phone] until 7pm. Please call Elink 7p-7a. [phone]

## 2023-03-20 NOTE — Care Management Important Message (Signed)
Important Message  Patient Details  Name: [name] MRN: [id number] Date of Birth: Nov 06, 1930   Medicare Important Message Given:  Yes     Renie Ora 03/20/2023, 11:09 AM

## 2023-03-20 NOTE — Progress Notes (Signed)
Patient ID: POSEIDON PAM, male   DOB: May 08, 1930, 89 y.o.   MRN: [id number]    Progress Note from the Palliative Medicine Team at [facility]   Patient Name: [name]        Date: 03/20/2023 DOB: 11/30/1929  Age: 89 y.o. MRN#: [id number] Attending Physician: Kathlen Mody, MD Primary Care Physician: [name], MD Admit Date: 03/05/2023   Medical records reviewed, assessed patient, discussed with treatment team  89 y.o. male   admitted on 03/05/2023 with significant pmhx of polymyalgia rheumatica on chronic steroids, recent hospitalization for aortitis, CHF, chronic leukocytosis, AAA with S/P stent graft in 2018, HLD, and DDD was admitted with an acute PE at [facility] ED and initially complaining of generalized weakness on 4/7. Patient developed SVT on the morning of 4/8 along with respiratory distress and hypoxia requiring non-re breather mask. Patient being managed by [facility], Cardiology, and Infectious Disease.  PMT working with family to help establish [facility] Events    4/7 Admitted for Acute PE 4/8 Developed Rapid Atrial Flutter-HR 170s-180s , along with respiratory failure requiring NRB, PCCM consulted  4/12 feeling better, HHFNC 60%, 45L, steroids tapered 03/13/23 ongoing delirium, high risk for decompensation 03-15-23 More alert today, intermittent confused and agitated --consideration for transfer out of ICU 03-20-23 slow continued improvement, plan is SNF for short term rehab    Patient is weak and with continued intermittent confusion  Patient's wife is HPOA   Family face ongoing  treatment option decisions, advanced directive decisions and anticipatory care needs.    Family are hopeful for continued improvement, plan is for SNF for short-term rehab.    Plan of Care -DNR/DNI -no artifical feeding now or in the future -continue with current medical interventions, family is hopeful to treat the treatable and continued improvement, hope is eventual return home     Education offered today regarding  the importance of continued conversation with family and the  medical providers regarding overall plan of care and treatment options,  ensuring decisions are within the context of the patients values and GOCs.  PMT will sign off at this time  Time:  35  minutes  Detailed review of medical records, medically appropriate exam,   discussed with treatment team, counseling and education to patient, family, staff, documenting clinical information, medication management, coordination of care    Lorinda Creed NP  Palliative Medicine Team Team Phone # [phone] Pager [phone]

## 2023-03-20 NOTE — Plan of Care (Signed)
  Problem: Education: Goal: Knowledge of General Education information will improve Description: Including pain rating scale, medication(s)/side effects and non-pharmacologic comfort measures Outcome: Progressing   Problem: Health Behavior/Discharge Planning: Goal: Ability to manage health-related needs will improve Outcome: Progressing   Problem: Clinical Measurements: Goal: Ability to maintain clinical measurements within normal limits will improve Outcome: Progressing Goal: Will remain free from infection Outcome: Progressing Goal: Diagnostic test results will improve Outcome: Progressing Goal: Respiratory complications will improve Outcome: Progressing Goal: Cardiovascular complication will be avoided Outcome: Progressing   Problem: Activity: Goal: Risk for activity intolerance will decrease Outcome: Progressing   Problem: Nutrition: Goal: Adequate nutrition will be maintained Outcome: Progressing   Problem: Coping: Goal: Level of anxiety will decrease Outcome: Progressing   Problem: Elimination: Goal: Will not experience complications related to bowel motility Outcome: Progressing Goal: Will not experience complications related to urinary retention Outcome: Progressing   Problem: Safety: Goal: Ability to remain free from injury will improve Outcome: Progressing   Problem: Skin Integrity: Goal: Risk for impaired skin integrity will decrease Outcome: Progressing   Problem: Education: Goal: Ability to describe self-care measures that may prevent or decrease complications (Diabetes Survival Skills Education) will improve Outcome: Progressing Goal: Individualized Educational Video(s) Outcome: Progressing   Problem: Coping: Goal: Ability to adjust to condition or change in health will improve Outcome: Progressing   Problem: Fluid Volume: Goal: Ability to maintain a balanced intake and output will improve Outcome: Progressing   Problem: Health  Behavior/Discharge Planning: Goal: Ability to identify and utilize available resources and services will improve Outcome: Progressing Goal: Ability to manage health-related needs will improve Outcome: Progressing   Problem: Metabolic: Goal: Ability to maintain appropriate glucose levels will improve Outcome: Progressing   Problem: Nutritional: Goal: Maintenance of adequate nutrition will improve Outcome: Progressing Goal: Progress toward achieving an optimal weight will improve Outcome: Progressing   Problem: Skin Integrity: Goal: Risk for impaired skin integrity will decrease Outcome: Progressing   Problem: Tissue Perfusion: Goal: Adequacy of tissue perfusion will improve Outcome: Progressing   

## 2023-03-20 NOTE — Progress Notes (Signed)
[facility]  Date of Admission:  03/05/2023           Reason for visit: Follow up on possible aortitis  Current antibiotics: Doxycycline Ceftriaxone  ASSESSMENT:    89 y.o. male admitted with:  Possible aortitis Pneumonia with consideration of eosinophilic pneumonia due to daptomycin versus declaration of autoimmune process Pulmonary embolism with heart strain and DVT in the right lower extremity Deconditioning with plan for discharge to rehab History of polymyalgia rheumatica  RECOMMENDATIONS:    Continue steroids per critical care Continue Bactrim for pneumocystis prophylaxis while on high-dose steroids Continuing ceftriaxone and doxycycline for possible aortitis with stop date 4/25.  Okay to stop antibiotics if he discharges to SNF prior to that date given that he is already completed a prolonged course No further recommendations at this time Patient to follow-up with ID at [facility] after discharge Will follow peripherally while he remains in the hospital   Principal Problem:   Acute pulmonary embolism Active Problems:   Hyperlipidemia   PMR (polymyalgia rheumatica)   AAA (abdominal aortic aneurysm) without rupture (HCC)   Generalized weakness   Acute respiratory failure with hypoxia   Acute hyponatremia   Elevated troponin   Lactic acidosis   HCAP (healthcare-associated pneumonia)   Chronic diastolic CHF (congestive heart failure)   BPH (benign prostatic hyperplasia)   Pulmonary embolism   Eosinophilic pneumonia   Aortitis   Atrial flutter   FUO (fever of unknown origin)   S/P insertion of endovascular thoracic aortic stent graft   Drug-induced insomnia   Anxiety   DVT, lower extremity, distal, acute, right   PAF (paroxysmal atrial fibrillation)   Acute metabolic encephalopathy   Paroxysmal A-fib   Pressure injury of skin   Delirium   Acute eosinophilic pneumonia    MEDICATIONS:    Scheduled Meds:  apixaban  5 mg Oral  BID   aspirin  81 mg Oral Daily   Chlorhexidine Gluconate Cloth  6 each Topical Daily   doxycycline  100 mg Oral Q12H   famotidine  20 mg Oral Daily   feeding supplement  237 mL Oral TID BM   fluorometholone  1 drop Right Eye QHS   folic acid  1 mg Oral Daily   hydrOXYzine  25 mg Oral Q6H   insulin aspart  0-15 Units Subcutaneous TID WC   melatonin  3 mg Oral QHS   metoprolol tartrate  12.5 mg Oral BID   nystatin  5 mL Oral QID   pantoprazole  40 mg Oral BID   predniSONE  40 mg Oral Q breakfast   QUEtiapine  12.5 mg Oral Daily   QUEtiapine  50 mg Oral QHS   senna-docusate  1 tablet Oral BID   sodium chloride flush  10-40 mL Intracatheter Q12H   sulfamethoxazole-trimethoprim  1 tablet Oral Once per day on Mon Wed Fri   traZODone  200 mg Oral QHS   Continuous Infusions:  sodium chloride Stopped (03/10/23 1127)   cefTRIAXone (ROCEPHIN)  IV 2 g (03/20/23 0952)   PRN Meds:.sodium chloride, acetaminophen **OR** acetaminophen, alum & mag hydroxide-simeth, dicyclomine, hydrALAZINE, lidocaine, loperamide, menthol-cetylpyridinium, ondansetron (ZOFRAN) IV, mouth rinse, sodium chloride flush  SUBJECTIVE:   24 hour events:  No acute events Patient's wife is at the bedside.  Patient is undergoing patient care.  She reports that he is still very weak with activity and that they are looking at potential rehab options     OBJECTIVE:  Blood pressure 128/79, pulse 92, temperature 98 F (36.7 C), resp. rate 20, height 6' (1.829 m), weight 67.4 kg, SpO2 95 %. Body mass index is 20.15 kg/m.  Physical Exam Constitutional:      Comments: Patient appears awake and alert.  He is lying in bed and in no acute distress.  He is undergoing patient care with the nursing staff.      Lab Results: Lab Results  Component Value Date   WBC 33.2 (H) 03/19/2023   HGB 10.1 (L) 03/19/2023   HCT 31.3 (L) 03/19/2023   MCV 96.9 03/19/2023   PLT 440 (H) 03/19/2023    Lab Results  Component Value Date    NA 139 03/19/2023   K 4.2 03/19/2023   CO2 29 03/19/2023   GLUCOSE 161 (H) 03/19/2023   BUN 34 (H) 03/19/2023   CREATININE 0.80 03/19/2023   CALCIUM 8.5 (L) 03/19/2023   GFRNONAA >60 03/19/2023   GFRAA >60 01/24/2018    Lab Results  Component Value Date   ALT 39 03/11/2023   AST 23 03/11/2023   ALKPHOS 78 03/11/2023   BILITOT 0.8 03/11/2023       Component Value Date/Time   CRP 29.0 (H) 03/06/2023 2140       Component Value Date/Time   ESRSEDRATE 70 (H) 03/15/2023 0402     I have reviewed the micro and lab results in Epic.  Imaging: DG CHEST PORT 1 VIEW  Result Date: 03/20/2023 CLINICAL DATA:  89 year old male with respiratory failure. Diagnosed with right lung PE or earlier this month. EXAM: PORTABLE CHEST 1 VIEW COMPARISON:  Portable chest 03/12/2023 and earlier. FINDINGS: Portable AP semi upright view at 0633 hours. Stable right PICC line. Stable somewhat low lung volumes. Stable cardiac size and mediastinal contours. Widespread abnormal course and confluent pulmonary opacity in both lungs, although peripheral right lung ventilation has improved. Left lung ventilation not significantly changed. Most of this opacity is new from February radiographs. No pneumothorax. No definite pleural effusion. Stable visualized osseous structures.  Negative visible bowel gas. IMPRESSION: 1. Widespread bilateral pulmonary opacity with some improved right lung ventilation since 03/12/2023. Left lung not significantly changed. This is nonspecific but might be sequelae of ARDS following pulmonary emboli. 2. Stable right PICC line. No new cardiopulmonary abnormality. Electronically Signed   By: Odessa Fleming M.D.   On: 03/20/2023 08:44     Imaging independently reviewed in Epic.    Vedia Coffer for Infectious Disease [facility] [phone] pager 03/20/2023, 12:50 PM  I have personally spent 50 minutes involved in face-to-face and non-face-to-face activities  for this patient on the day of the visit. Professional time spent includes the following activities: Preparing to see the patient (review of tests), Obtaining and/or reviewing separately obtained history (admission/discharge record), Performing a medically appropriate examination and/or evaluation , Ordering medications/tests/procedures, referring and communicating with other health care professionals, Documenting clinical information in the EMR, Independently interpreting results (not separately reported), Communicating results to the patient/family/caregiver, Counseling and educating the patient/family/caregiver and Care coordination (not separately reported).

## 2023-03-20 NOTE — Progress Notes (Signed)
[facility]                                                                               [name], is a 89 y.o. male, DOB - 1930/03/08, ZOX:[id number] Admit date - 03/05/2023    Outpatient Primary MD for the patient is [name], MD  LOS - 14  days    Brief summary   89 yr old male with significant pmhx of polymyalgia rheumatica on chronic steroids, recent hospitalization for aortitis, CHF, chronic leukocytosis, AAA with S/P stent graft in 2018, HLD, and DDD was admitted with an acute PE at [facility] ED and initially complaining of generalized weakness on 4/7. Patient with worsening respiratory distress and hypoxia on 4/8/ requiring non-re breather mask, found to have PE and DVT. Patient being managed by [facility], Cardiology, and Infectious Disease. PCCM consulted by ID to assist in managing the acute hypoxic respiratory failure.      Assessment & Plan    Assessment and Plan:  Acute hypoxic respiratory failure in the setting of multifocal pneumonia versus eosinophilic pneumonia from daptomycin versus interstitial lung disease/autoimmune process. Acute pulmonary embolism with right lower extremity DVT in the setting of PMR on chronic steroids, immunocompromise Daptomycin was stopped on 4/7. He was started on high-dose steroids and currently on prednisone 40 mg daily to continue for 3 to 4 weeks until seen by PCCM outpatient.  Initially required high flow nasal cannula oxygen currently on 4 L of nasal cannula oxygen. ID on board recommended to continue with IV ceftriaxone, oral doxycycline, Bactrim for PJP prophylaxis. Patient reports nausea with doxycycline, better with zofran. He reports not having any appetite. He remains on 4 lit of [place] oxygen.  Physical therapy.   Paroxysmal atrial flutter/fibrillation Rate controlled with metoprolol 12.5 mg twice daily and on Eliquis for anticoagulation. Echo shows left ventricular ejection fraction of 55 to 60% with no regional wall  abnormalities.  Left ventricular diastolic parameters are normal   Pulmonary embolism and right lower extremity DVT On Eliquis for anticoagulation. He remains on 4 lit of [place] oxygen. Wean him off oxygen as tolerated.   Chest pain yesterday, which appears to have resolved.  Elevated troponins. Demand ischemia from respiratory illness? Cardiology consulted by PCCM.  Currently chest pain free.   AAA s/p sciatic graft and possible aortitis Patient was on daptomycin per ID at [facility] which was discontinued for concern for of eosinophilic pneumonia Patient currently on IV ceftriaxone, doxycycline and Bactrim for 4 more days to complete the course.    Acute delirium/anxiety Multifactorial possibly hospital delirium versus from hypoxia versus from sepsis versus from steroids Initially required Precedex gtt, has been weaned off and currently on Seroquel, Atarax and trazodone.    Hyperlipidemia:  On statin/ Crestor 5 mg daily, family does not want the crestor. Will d/c crestor.    PMR:  Pt is on 5 mg of prednisone at home, currently on 40 mg of prednisone daiy.   BPH   Anemia of chronic disease:  Baseline hemoglobin around 11. Currently at 10.5  continue to monitor.    Chronic leukocytosis:  Recommend outpatient follow up with hematology.  Recheck cbc today.  GERD: On PPI/ Pepcid. Zofran.   Nausea with doxycycline,  On zofran.    Oral Thrush:  Nystatin suspension and oral diflucan ordered.  Improving.   Pressure injury not present on admission.  RN Pressure Injury Documentation: Pressure Injury 03/12/23 Sacrum Mid Stage 1 -  Intact skin with non-blanchable redness of a localized area usually over a bony prominence. Pink non blanching on sacrum (Active)  03/12/23 0800  Location: Sacrum  Location Orientation: Mid  Staging: Stage 1 -  Intact skin with non-blanchable redness of a localized area usually over a bony prominence.  Wound Description (Comments): Pink non blanching  on sacrum  Present on Admission: No  Dressing Type Foam - Lift dressing to assess site every shift 03/19/23 1940  Foam dressing in place.   Estimated body mass index is 20.15 kg/m as calculated from the following:   Height as of this encounter: 6' (1.829 m).   Weight as of this encounter: 67.4 kg.  Code Status: DNR DVT Prophylaxis:   apixaban (ELIQUIS) tablet 5 mg   Level of Care: Level of care: Telemetry Cardiac Family Communication: discussed the plan with the son at bedside.   Disposition Plan:     Remains inpatient appropriate: IV ceftriaxone,   Procedures:  Echocardiogram.   Consultants:   ID PALLIATIVE CARE PCCM  Significant events  4/7 Admitted for Acute PE 4/8 Developed Rapid Atrial Flutter-HR 170s-180s , along with respiratory failure requiring NRB, PCCM consulted  4/12 feeling better, HHFNC 60%, 45L, steroids tapered 4/15 PMT consult, on  salter HFNC 8-10L,  4/17 Off precedex since last evening, agitation seems to be improving 4/18 transfer to [facility] from PCCM.   Antimicrobials:   Anti-infectives (From admission, onward)    Start     Dose/Rate Route Frequency Ordered Stop   03/17/23 1500  fluconazole (DIFLUCAN) tablet 200 mg        200 mg Oral  Once 03/17/23 1403 03/17/23 1540   03/15/23 2200  doxycycline (VIBRA-TABS) tablet 100 mg        100 mg Oral Every 12 hours 03/15/23 1109 03/23/23 2359   03/15/23 1400  cefTRIAXone (ROCEPHIN) 2 g in sodium chloride 0.9 % 100 mL IVPB        2 g 200 mL/hr over 30 Minutes Intravenous Daily 03/15/23 1109 03/23/23 2359   03/13/23 2200  vancomycin (VANCOREADY) IVPB 750 mg/150 mL  Status:  Discontinued        750 mg 150 mL/hr over 60 Minutes Intravenous Every 12 hours 03/13/23 1131 03/15/23 1109   03/10/23 2300  vancomycin (VANCOREADY) IVPB 1250 mg/250 mL  Status:  Discontinued        1,250 mg 166.7 mL/hr over 90 Minutes Intravenous Every 18 hours 03/10/23 2243 03/13/23 1131   03/10/23 2000  piperacillin-tazobactam (ZOSYN)  IVPB 3.375 g  Status:  Discontinued        3.375 g 12.5 mL/hr over 240 Minutes Intravenous Every 8 hours 03/10/23 1140 03/15/23 1109   03/06/23 1715  vancomycin (VANCOREADY) IVPB 1500 mg/300 mL  Status:  Discontinued        1,500 mg 150 mL/hr over 120 Minutes Intravenous  Once 03/06/23 1628 03/06/23 1628   03/06/23 1628  vancomycin (VANCOREADY) IVPB 1250 mg/250 mL  Status:  Discontinued        1,250 mg 166.7 mL/hr over 90 Minutes Intravenous Every 24 hours 03/06/23 1628 03/10/23 2243   03/06/23 0900  sulfamethoxazole-trimethoprim (BACTRIM DS) 800-160 MG per tablet 1 tablet  1 tablet Oral Once per day on Mon Wed Fri 03/05/23 2141     03/06/23 0800  ceFEPIme (MAXIPIME) 2 g in sodium chloride 0.9 % 100 mL IVPB  Status:  Discontinued        2 g 200 mL/hr over 30 Minutes Intravenous Every 12 hours 03/05/23 2140 03/10/23 1140   03/05/23 1845  DAPTOmycin (CUBICIN) 600 mg in sodium chloride 0.9 % IVPB  Status:  Discontinued        600 mg 124 mL/hr over 30 Minutes Intravenous Daily 03/05/23 1833 03/06/23 1546   03/05/23 1645  ceFEPIme (MAXIPIME) 2 g in sodium chloride 0.9 % 100 mL IVPB        2 g 200 mL/hr over 30 Minutes Intravenous  Once 03/05/23 1639 03/05/23 2018   03/05/23 1645  vancomycin (VANCOREADY) IVPB 1500 mg/300 mL  Status:  Discontinued        1,500 mg 150 mL/hr over 120 Minutes Intravenous  Once 03/05/23 1639 03/05/23 1645        Medications  Scheduled Meds:  apixaban  5 mg Oral BID   aspirin  81 mg Oral Daily   Chlorhexidine Gluconate Cloth  6 each Topical Daily   doxycycline  100 mg Oral Q12H   famotidine  20 mg Oral Daily   feeding supplement  237 mL Oral TID BM   fluorometholone  1 drop Right Eye QHS   folic acid  1 mg Oral Daily   hydrOXYzine  25 mg Oral Q6H   insulin aspart  0-15 Units Subcutaneous TID WC   melatonin  3 mg Oral QHS   metoprolol tartrate  12.5 mg Oral BID   nystatin  5 mL Oral QID   pantoprazole  40 mg Oral BID   predniSONE  40 mg Oral Q  breakfast   QUEtiapine  12.5 mg Oral Daily   QUEtiapine  50 mg Oral QHS   senna-docusate  1 tablet Oral BID   sodium chloride flush  10-40 mL Intracatheter Q12H   sulfamethoxazole-trimethoprim  1 tablet Oral Once per day on Mon Wed Fri   traZODone  200 mg Oral QHS   Continuous Infusions:  sodium chloride Stopped (03/10/23 1127)   cefTRIAXone (ROCEPHIN)  IV 2 g (03/20/23 0952)   PRN Meds:.sodium chloride, acetaminophen **OR** acetaminophen, alum & mag hydroxide-simeth, dicyclomine, hydrALAZINE, lidocaine, loperamide, menthol-cetylpyridinium, ondansetron (ZOFRAN) IV, mouth rinse, sodium chloride flush    Subjective:   [name] was seen and examined today.  Appetite is improving.  Having some loose stools. Mostly on liquid diet.   Objective:   Vitals:   03/20/23 1100 03/20/23 1130 03/20/23 1155 03/20/23 1156  BP:      Pulse: 88 88 93 92  Resp: 16 16 (!) 23 20  Temp:      TempSrc:      SpO2: 96% 97% 94% 95%  Weight:      Height:        Intake/Output Summary (Last 24 hours) at 03/20/2023 1232 Last data filed at 03/20/2023 0956 Gross per 24 hour  Intake 460 ml  Output 1650 ml  Net -1190 ml    Filed Weights   03/18/23 0452 03/19/23 0552 03/20/23 0615  Weight: 67.6 kg 70.1 kg 67.4 kg     Exam General exam: Appears calm and comfortable  Respiratory system: Clear to auscultation. Respiratory effort normal. Cardiovascular system: S1 & S2 heard, RRR. No JVD, No pedal edema. Gastrointestinal system: Abdomen is nondistended, soft and nontender.  Central nervous  system: Alert and oriented. No focal neurological deficits. Extremities: no edema.  Skin: No rashes, Psychiatry: Mood & affect appropriate.        Data Reviewed:  I have personally reviewed following labs and imaging studies   CBC Lab Results  Component Value Date   WBC 33.2 (H) 03/19/2023   RBC 3.23 (L) 03/19/2023   HGB 10.1 (L) 03/19/2023   HCT 31.3 (L) 03/19/2023   MCV 96.9 03/19/2023   MCH  31.3 03/19/2023   PLT 440 (H) 03/19/2023   MCHC 32.3 03/19/2023   RDW 16.1 (H) 03/19/2023   LYMPHSABS 0.0 (L) 03/19/2023   MONOABS 1.3 (H) 03/19/2023   EOSABS 0.0 03/19/2023   BASOSABS 0.0 03/19/2023     Last metabolic panel Lab Results  Component Value Date   NA 139 03/19/2023   K 4.2 03/19/2023   CL 101 03/19/2023   CO2 29 03/19/2023   BUN 34 (H) 03/19/2023   CREATININE 0.80 03/19/2023   GLUCOSE 161 (H) 03/19/2023   GFRNONAA >60 03/19/2023   GFRAA >60 01/24/2018   CALCIUM 8.5 (L) 03/19/2023   PHOS 3.3 03/14/2023   PROT 5.6 (L) 03/11/2023   ALBUMIN 2.2 (L) 03/14/2023   LABGLOB 2.5 03/21/2016   AGRATIO 1.6 03/21/2016   BILITOT 0.8 03/11/2023   ALKPHOS 78 03/11/2023   AST 23 03/11/2023   ALT 39 03/11/2023   ANIONGAP 9 03/19/2023    CBG (last 3)  Recent Labs    03/19/23 2149 03/20/23 0622 03/20/23 1108  GLUCAP 152* 115* 126*       Coagulation Profile: No results for input(s): "INR", "PROTIME" in the last 168 hours.   Radiology Studies: DG CHEST PORT 1 VIEW  Result Date: 03/20/2023 CLINICAL DATA:  89 year old male with respiratory failure. Diagnosed with right lung PE or earlier this month. EXAM: PORTABLE CHEST 1 VIEW COMPARISON:  Portable chest 03/12/2023 and earlier. FINDINGS: Portable AP semi upright view at 0633 hours. Stable right PICC line. Stable somewhat low lung volumes. Stable cardiac size and mediastinal contours. Widespread abnormal course and confluent pulmonary opacity in both lungs, although peripheral right lung ventilation has improved. Left lung ventilation not significantly changed. Most of this opacity is new from February radiographs. No pneumothorax. No definite pleural effusion. Stable visualized osseous structures.  Negative visible bowel gas. IMPRESSION: 1. Widespread bilateral pulmonary opacity with some improved right lung ventilation since 03/12/2023. Left lung not significantly changed. This is nonspecific but might be sequelae of ARDS  following pulmonary emboli. 2. Stable right PICC line. No new cardiopulmonary abnormality. Electronically Signed   By: Odessa Fleming M.D.   On: 03/20/2023 08:44       Kathlen Mody M.D. [facility] 03/20/2023, 12:32 PM  Available via Epic secure chat 7am-7pm After 7 pm, please refer to night coverage provider listed on amion.

## 2023-03-20 NOTE — Progress Notes (Signed)
PT less anxious and calmer today than yesterday. Only wife visited. Had multiple episodes of small amounts of soft stool, dr. Blake Divine updated and new order for Imodium and RN requested d/c scheduled bowel medications. PT given two doses this shift with good results. PT denies pain. Wife at bedside most of the day, updated.

## 2023-03-21 DIAGNOSIS — R531 Weakness: Secondary | ICD-10-CM | POA: Diagnosis not present

## 2023-03-21 DIAGNOSIS — J9601 Acute respiratory failure with hypoxia: Secondary | ICD-10-CM | POA: Diagnosis not present

## 2023-03-21 DIAGNOSIS — I2699 Other pulmonary embolism without acute cor pulmonale: Secondary | ICD-10-CM | POA: Diagnosis not present

## 2023-03-21 DIAGNOSIS — G9341 Metabolic encephalopathy: Secondary | ICD-10-CM | POA: Diagnosis not present

## 2023-03-21 LAB — GLUCOSE, CAPILLARY
Glucose-Capillary: 92 mg/dL (ref 70–99)
Glucose-Capillary: 126 mg/dL — ABNORMAL HIGH (ref 70–99)
Glucose-Capillary: 206 mg/dL — ABNORMAL HIGH (ref 70–99)
Glucose-Capillary: 106 mg/dL — ABNORMAL HIGH (ref 70–99)

## 2023-03-21 MED ORDER — HYDROXYZINE HCL 25 MG PO TABS
50.0000 mg | ORAL_TABLET | Freq: Four times a day (QID) | ORAL | Status: DC | PRN
  Administered 2023-03-22: 50 mg via ORAL
  Filled 2023-03-21 (×2): qty 2

## 2023-03-21 NOTE — TOC Progression Note (Addendum)
Transition of Care [facility]) - Progression Note    Patient Details  Name: [name] MRN: [id number] Date of Birth: [birth year]  Transition of Care [facility]) CM/SW Contact  Leander Rams, LCSW Phone Number: 03/21/2023, 11:15 AM  Clinical Narrative:    CSW spoke with pt with to confirm pt would like to dc to [facility] SNF. Wife stated that she spoke with Whitney form [facility] who stated she has a bed available for pt and will need a referral to offer admission. CSW discussed with wife that pt will be transported via ambulance at the time of dc.   CSW faxed referral out to [facility]. TOC will continue to follow.    Expected Discharge Plan: Home w Home Health Services Barriers to Discharge: Continued Medical Work up  Expected Discharge Plan and Services   Discharge Planning Services: CM Consult Post Acute Care Choice: Home Health Living arrangements for the past 2 months: Single Family Home                                       Social Determinants of Health (SDOH) Interventions SDOH Screenings   Food Insecurity: No Food Insecurity (03/15/2023)  Housing: Low Risk  (03/15/2023)  Transportation Needs: No Transportation Needs (03/15/2023)  Utilities: Not At Risk (03/15/2023)  Financial Resource Strain: Low Risk  (03/07/2023)  Tobacco Use: Low Risk  (03/05/2023)    Readmission Risk Interventions     No data to display         Oletta Lamas, MSW, LCSWA, LCASA Transitions of Care  Clinical Social Worker I

## 2023-03-21 NOTE — Progress Notes (Signed)
Physical Therapy Treatment Patient Details Name: [name] MRN: [id number] DOB: May 23, 1930 Today's Date: 03/21/2023   History of Present Illness 89 yr old male  was admitted with an acute PE at [facility] ED and initially complaining of generalized weakness on 4/7. Patient developed SVT on the morning of 4/8 along with respiratory distress and hypoxia requiring non-re breather mask. [id number] rheumatica on chronic steroids, recent hospitalization for aortitis, CHF, chronic leukocytosis, AAA with S/P stent graft in 2018, HLD, and DDD    PT Comments    Pt with fair tolerance to treatment today. Co treat with OT. Pt with similar presentation to previous session however was able to increase sitting tolerance today to ~2 minutes. Pt and spouse anticipate DC to SNF within next 2 days. No change in DC/DME recs at this time. PT will continue to follow.  Recommendations for follow up therapy are one component of a multi-disciplinary discharge planning process, led by the attending physician.  Recommendations may be updated based on patient status, additional functional criteria and insurance authorization.  Follow Up Recommendations  Can patient physically be transported by private vehicle: No    Assistance Recommended at Discharge Frequent or constant Supervision/Assistance  Patient can return home with the following Assistance with cooking/housework;Direct supervision/assist for medications management;Direct supervision/assist for financial management;Assist for transportation;Help with stairs or ramp for entrance;A lot of help with walking and/or transfers;A lot of help with bathing/dressing/bathroom   Equipment Recommendations  Other (comment) (Per accepting facility)    Recommendations for Other Services       Precautions / Restrictions Precautions Precautions: Fall;Other (comment) Precaution Comments: watch sats and BP Restrictions Weight Bearing Restrictions: No     Mobility  Bed  Mobility Overal bed mobility: Needs Assistance Bed Mobility: Supine to Sit, Sit to Supine     Supine to sit: Mod assist Sit to supine: Supervision   General bed mobility comments: pt able to advance LEs to EOB, min assist to raise trunk and bring R hip for feet to be on the floor, pt able to return to supine on his own    Transfers                   General transfer comment: pt declined    Ambulation/Gait                   Stairs             Wheelchair Mobility    Modified Rankin (Stroke Patients Only)       Balance Overall balance assessment: Needs assistance Sitting-balance support: Feet supported, No upper extremity supported Sitting balance-Leahy Scale: Fair Sitting balance - Comments: Able to tolerate ~2 minutes seated EOB before requesting to lay down.                                    Cognition Arousal/Alertness: Awake/alert Behavior During Therapy: WFL for tasks assessed/performed Overall Cognitive Status: Within Functional Limits for tasks assessed                                          Exercises      General Comments General comments (skin integrity, edema, etc.): Pt received      Pertinent Vitals/Pain Pain Assessment Pain Assessment: No/denies pain    Home Living  Prior Function            PT Goals (current goals can now be found in the care plan section) Progress towards PT goals: Progressing toward goals    Frequency    Min 1X/week      PT Plan Current plan remains appropriate    Co-evaluation              AM-PAC PT "6 Clicks" Mobility   Outcome Measure  Help needed turning from your back to your side while in a flat bed without using bedrails?: A Lot Help needed moving from lying on your back to sitting on the side of a flat bed without using bedrails?: A Lot Help needed moving to and from a bed to a chair (including a  wheelchair)?: Total Help needed standing up from a chair using your arms (e.g., wheelchair or bedside chair)?: Total Help needed to walk in hospital room?: Total Help needed climbing 3-5 steps with a railing? : Total 6 Click Score: 8    End of Session Equipment Utilized During Treatment: Oxygen Activity Tolerance: Patient limited by fatigue Patient left: in bed;with call bell/phone within reach;with family/visitor present Nurse Communication: Mobility status PT Visit Diagnosis: Difficulty in walking, not elsewhere classified (R26.2);Other abnormalities of gait and mobility (R26.89);Unsteadiness on feet (R26.81)     Time: 2725-3664 PT Time Calculation (min) (ACUTE ONLY): 22 min  Charges:  $Therapeutic Activity: 8-22 mins                     Shela Nevin, PT, DPT Acute Rehab Services [phone]    [name] 03/21/2023, 3:31 PM

## 2023-03-21 NOTE — [place] FL2 (Signed)
[place] MEDICAID FL2 LEVEL OF CARE FORM     IDENTIFICATION  Patient Name: [name] Birthdate: [birth year] Sex: male Admission Date (Current Location): 03/05/2023  [facility] and [place] Number:  Producer, television/film/video and Address:  [place], [address], [place], [place] [address]      Provider Number: [id number]  Attending Physician Name and Address:  Kathlen Mody, MD  Relative Name and Phone Number:       Current Level of Care: Hospital Recommended Level of Care: Skilled Nursing Facility Prior Approval Number:    Date Approved/Denied:   PASRR Number: [id number]  Discharge Plan: SNF    Current Diagnoses: Patient Active Problem List   Diagnosis Date Noted   Delirium 03/14/2023   Acute eosinophilic pneumonia 03/14/2023   Acute metabolic encephalopathy 03/13/2023   Paroxysmal A-fib 03/13/2023   Pressure injury of skin 03/13/2023   PAF (paroxysmal atrial fibrillation) 03/08/2023   Drug-induced insomnia 03/07/2023   Anxiety 03/07/2023   DVT, lower extremity, distal, acute, right 03/07/2023   Pulmonary embolism 03/06/2023   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   Aortitis 03/06/2023   Atrial flutter 03/06/2023   FUO (fever of unknown origin) 03/06/2023   S/P insertion of endovascular thoracic aortic stent graft 03/06/2023   Acute pulmonary embolism 03/05/2023   Acute respiratory failure with hypoxia 03/05/2023   Acute hyponatremia 03/05/2023   Elevated troponin 03/05/2023   Lactic acidosis 03/05/2023   HCAP (healthcare-associated pneumonia) 03/05/2023   Chronic diastolic CHF (congestive heart failure) 03/05/2023   BPH (benign prostatic hyperplasia) 03/05/2023   Fever 01/24/2023   S/P AAA repair 08/16/2017   Generalized weakness 04/03/2017   Abdominal pain, RUQ 03/28/2017   Sinusitis 03/14/2017   Skin tear of hand without complication, initial encounter 01/11/2017   Abrasion, multiple sites 01/11/2017   Mass of left hand  10/19/2016   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   AAA (abdominal aortic aneurysm) without rupture (HCC) 01/26/2016   Dupuytren's contracture of left hand 08/26/2014   Contracture of joint of finger of left hand 08/26/2014   Branch retinal vein occlusion 05/27/2014   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   Special screening for malignant neoplasm of prostate 02/18/2014   Urinary frequency 02/18/2014   Incomplete bladder emptying 02/18/2014   AAA (abdominal aortic aneurysm) 01/07/2014   Hyperlipidemia 08/20/2013   PMR (polymyalgia rheumatica) 08/20/2013   Unspecified vitamin D deficiency 08/20/2013   Impotence of organic origin 08/20/2013   Floater, vitreous 10/19/2012   Cerebrovascular disease 09/28/2012   Cataract, nuclear 03/14/2012   Corneal macula not interfering with central vision 03/14/2012   COLONIC POLYPS, HX OF 03/16/2010    Orientation RESPIRATION BLADDER Height & Weight     Self, Time, Situation, Place  O2 (2 liters) Incontinent, External catheter Weight: 146 lb 13.2 oz (66.6 kg) Height:  6' (182.9 cm)  BEHAVIORAL SYMPTOMS/MOOD NEUROLOGICAL BOWEL NUTRITION STATUS      Incontinent Diet (See dc summary)  AMBULATORY STATUS COMMUNICATION OF NEEDS Skin   Extensive Assist Verbally Normal                       Personal Care Assistance Level of Assistance  Bathing, Feeding, Dressing Bathing Assistance: Limited assistance Feeding assistance: Independent Dressing Assistance: Limited assistance     Functional Limitations Info  Sight, Hearing, Speech Sight Info: Impaired Hearing Info: Impaired Speech Info: Adequate    SPECIAL CARE FACTORS FREQUENCY  PT (By licensed PT), OT (By licensed OT)     PT  Frequency: 5xweek OT Frequency: 5xweek            Contractures Contractures Info: Not present    Additional Factors Info  Code Status, Allergies Code Status Info: DNR Allergies Info: Rosuvastatin  Codeine  Morphine And Related           Current Medications  (03/21/2023):  This is the current hospital active medication list Current Facility-Administered Medications  Medication Dose Route Frequency Provider Last Rate Last Admin   0.9 %  sodium chloride infusion   Intravenous PRN Cheri Fowler, MD   Stopped at 03/10/23 1127   acetaminophen (TYLENOL) tablet 650 mg  650 mg Oral Q6H PRN Howerter, Justin B, DO   650 mg at 03/19/23 1715   Or   acetaminophen (TYLENOL) suppository 650 mg  650 mg Rectal Q6H PRN Howerter, Justin B, DO       alum & mag hydroxide-simeth (MAALOX/MYLANTA) 200-200-20 MG/5ML suspension 30 mL  30 mL Oral Q6H PRN Selmer Dominion B, NP   30 mL at 03/17/23 1938   apixaban (ELIQUIS) tablet 5 mg  5 mg Oral BID Rockwell Alexandria, RPH   5 mg at 03/21/23 6962   aspirin chewable tablet 81 mg  81 mg Oral Daily Steffanie Dunn, DO   81 mg at 03/21/23 0915   cefTRIAXone (ROCEPHIN) 2 g in sodium chloride 0.9 % 100 mL IVPB  2 g Intravenous Daily Daiva Eves, Lisette Grinder, MD 200 mL/hr at 03/21/23 9528 2 g at 03/21/23 4132   Chlorhexidine Gluconate Cloth 2 % PADS 6 each  6 each Topical Daily Wynetta Fines, MD   6 each at 03/21/23 0919   dicyclomine (BENTYL) 10 MG/5ML solution 10 mg  10 mg Oral TID PRN Karie Fetch P, DO       doxycycline (VIBRA-TABS) tablet 100 mg  100 mg Oral Q12H Daiva Eves, Lisette Grinder, MD   100 mg at 03/21/23 0916   famotidine (PEPCID) tablet 20 mg  20 mg Oral Daily Selmer Dominion B, NP   20 mg at 03/21/23 0916   feeding supplement (ENSURE ENLIVE / ENSURE PLUS) liquid 237 mL  237 mL Oral TID BM Luciano Cutter, MD   237 mL at 03/20/23 1407   fluorometholone (FML) 0.1 % ophthalmic suspension 1 drop  1 drop Right Eye QHS Howerter, Justin B, DO   1 drop at 03/20/23 2110   folic acid (FOLVITE) tablet 1 mg  1 mg Oral Daily Howerter, Justin B, DO   1 mg at 03/21/23 0916   hydrALAZINE (APRESOLINE) injection 10 mg  10 mg Intravenous Q4H PRN Migdalia Dk, MD   10 mg at 03/17/23 1821   hydrOXYzine (ATARAX) tablet 25 mg  25 mg Oral Q6H Kathlen Mody, MD   25 mg at 03/21/23 0625   insulin aspart (novoLOG) injection 0-15 Units  0-15 Units Subcutaneous TID WC Karie Fetch P, DO   3 Units at 03/20/23 1722   lidocaine 2% "Nebulized" 2.5 mL  2.5 mL Nebulization Q4H PRN Melody Comas B, MD       loperamide (IMODIUM) capsule 2 mg  2 mg Oral PRN Kathlen Mody, MD   2 mg at 03/20/23 1721   melatonin tablet 3 mg  3 mg Oral QHS Cheri Fowler, MD   3 mg at 03/20/23 2109   menthol-cetylpyridinium (CEPACOL) lozenge 3 mg  1 lozenge Oral PRN Cheri Fowler, MD       metoprolol tartrate (LOPRESSOR) tablet 12.5 mg  12.5 mg Oral  BID Selmer Dominion B, NP   12.5 mg at 03/21/23 0915   nystatin (MYCOSTATIN) 100000 UNIT/ML suspension 500,000 Units  5 mL Oral QID Melody Comas B, MD   500,000 Units at 03/21/23 0919   ondansetron [facility]) injection 4 mg  4 mg Intravenous Q6H PRN Howerter, Justin B, DO   4 mg at 03/21/23 0755   Oral care mouth rinse  15 mL Mouth Rinse PRN Cheri Fowler, MD       pantoprazole (PROTONIX) EC tablet 40 mg  40 mg Oral BID Selmer Dominion B, NP   40 mg at 03/21/23 0916   predniSONE (DELTASONE) tablet 40 mg  40 mg Oral Q breakfast Melody Comas B, MD   40 mg at 03/21/23 0919   QUEtiapine (SEROQUEL) tablet 12.5 mg  12.5 mg Oral Daily Karie Fetch P, DO   12.5 mg at 03/21/23 0915   QUEtiapine (SEROQUEL) tablet 50 mg  50 mg Oral QHS Karie Fetch P, DO   50 mg at 03/20/23 2109   senna-docusate (Senokot-S) tablet 1 tablet  1 tablet Oral BID Cheri Fowler, MD   1 tablet at 03/20/23 0935   sodium chloride flush (NS) 0.9 % injection 10-40 mL  10-40 mL Intracatheter Q12H Wynetta Fines, MD   10 mL at 03/21/23 0920   sodium chloride flush (NS) 0.9 % injection 10-40 mL  10-40 mL Intracatheter PRN Wynetta Fines, MD   10 mL at 03/20/23 0956   sulfamethoxazole-trimethoprim (BACTRIM DS) 800-160 MG per tablet 1 tablet  1 tablet Oral Once per day on Mon Wed Fri [place], Justin B, DO   1 tablet at 03/20/23 1157   traZODone (DESYREL) tablet  200 mg  200 mg Oral Leanord Hawking, MD   200 mg at 03/20/23 2108     Discharge Medications: Please see discharge summary for a list of discharge medications.  Relevant Imaging Results:  Relevant Lab Results:   Additional Information SSN: [id number]  Oletta Lamas, MSW, [facility]  Clinical Social Worker I

## 2023-03-21 NOTE — Progress Notes (Addendum)
Occupational Therapy Treatment Patient Details Name: [name] MRN: [id number] DOB: 24-May-1930 Today's Date: 03/21/2023   History of present illness 89 yr old male  was admitted with an acute PE at [facility] ED and initially complaining of generalized weakness on 4/7. Patient developed SVT on the morning of 4/8 along with respiratory distress and hypoxia requiring non-re breather mask. NWG:[id number] rheumatica on chronic steroids, recent hospitalization for aortitis, CHF, chronic leukocytosis, AAA with S/P stent graft in 2018, HLD, and DDD   OT comments  Co tx with PT this session. Pt required increased time to initiate sitting EOB with mod A to elevate trunk. Pt washed face and hands seated EOB min guard A. Session limited by pt fatigue and pt adamantly insisting on returning to supine in bed after sitting EOB ~ 2 minutes. Pt/family planning for post acute rehab and is scheduled to d/c to a SNF for short term rehab after acute care stay. OT will continue to follow acutely to maximize level of function and safety   Recommendations for follow up therapy are one component of a multi-disciplinary discharge planning process, led by the attending physician.  Recommendations may be updated based on patient status, additional functional criteria and insurance authorization.    Assistance Recommended at Discharge Frequent or constant Supervision/Assistance  Patient can return home with the following  Help with stairs or ramp for entrance;Assist for transportation;Assistance with cooking/housework;Direct supervision/assist for financial management;Direct supervision/assist for medications management;A lot of help with bathing/dressing/bathroom;A lot of help with walking and/or transfers   Equipment Recommendations  BSC/3in1    Recommendations for Other Services      Precautions / Restrictions Precautions Precautions: Fall;Other (comment) Precaution Comments: watch sats and BP Restrictions Weight  Bearing Restrictions: No       Mobility Bed Mobility Overal bed mobility: Needs Assistance Bed Mobility: Supine to Sit, Sit to Supine     Supine to sit: Mod assist Sit to supine: Supervision   General bed mobility comments: pt able to advance LEs to EOB, min assist to raise trunk and bring R hip for feet to be on the floor, pt able to return to supine on his own    Transfers                         Balance Overall balance assessment: Needs assistance Sitting-balance support: Feet supported, No upper extremity supported Sitting balance-Leahy Scale: Fair Sitting balance - Comments: Able to tolerate ~2 minutes seated EOB before requesting to lay down.                                   ADL either performed or assessed with clinical judgement   ADL Overall ADL's : Needs assistance/impaired     Grooming: Wash/dry hands;Wash/dry face;Min guard;Sitting                                 General ADL Comments: session limited by pt fatigue and pt adamantly insisting on returning to supine in bed after sitting EOB ~ 2 minutes    Extremity/Trunk Assessment Upper Extremity Assessment Upper Extremity Assessment: Overall WFL for tasks assessed   Lower Extremity Assessment Lower Extremity Assessment: Defer to PT evaluation        Vision Baseline Vision/History: 1 Wears glasses Ability to See in Adequate Light: 0 Adequate Patient Visual  Report: No change from baseline     Perception     Praxis      Cognition Arousal/Alertness: Awake/alert Behavior During Therapy: WFL for tasks assessed/performed Overall Cognitive Status: Within Functional Limits for tasks assessed                                          Exercises      Shoulder Instructions       General Comments Pt received    Pertinent Vitals/ Pain       Pain Assessment Pain Assessment: No/denies pain Pain Score: 0-No pain Pain Intervention(s): Monitored  during session, Repositioned  Home Living                                          Prior Functioning/Environment              Frequency  Min 1X/week        Progress Toward Goals  OT Goals(current goals can now be found in the care plan section)  Progress towards OT goals: OT to reassess next treatment     Plan Discharge plan remains appropriate    Co-evaluation                 AM-PAC OT "6 Clicks" Daily Activity     Outcome Measure   Help from another person eating meals?: None Help from another person taking care of personal grooming?: A Little Help from another person toileting, which includes using toliet, bedpan, or urinal?: Total Help from another person bathing (including washing, rinsing, drying)?: A Lot Help from another person to put on and taking off regular upper body clothing?: A Little Help from another person to put on and taking off regular lower body clothing?: Total 6 Click Score: 14    End of Session    OT Visit Diagnosis: Unsteadiness on feet (R26.81);Muscle weakness (generalized) (M62.81)   Activity Tolerance Patient limited by fatigue   Patient Left in bed;with call bell/phone within reach;with family/visitor present   Nurse Communication          Time: 1610-9604 OT Time Calculation (min): 10 min  Charges: OT General Charges $OT Visit: 1 Visit OT Treatments $Therapeutic Activity: 8-22 mins    Galen Manila 03/21/2023, 3:37 PM

## 2023-03-21 NOTE — Progress Notes (Signed)
[facility]                                                                               [name], is a 89 y.o. male, DOB - [birth year], ONG:[id number] Admit date - 03/05/2023    Outpatient Primary MD for the patient is Randall Ran, MD  LOS - 15  days    Brief summary   89 yr old male with significant pmhx of polymyalgia rheumatica on chronic steroids, recent hospitalization for aortitis, CHF, chronic leukocytosis, AAA with S/P stent graft in 2018, HLD, and DDD was admitted with an acute PE at [facility] ED and initially complaining of generalized weakness on 4/7. Patient with worsening respiratory distress and hypoxia on 4/8/ requiring non-re breather mask, found to have PE and DVT. Patient being managed by [facility], Cardiology, and Infectious Disease. PCCM consulted by ID to assist in managing the acute hypoxic respiratory failure.      Assessment & Plan    Assessment and Plan:  Acute hypoxic respiratory failure in the setting of multifocal pneumonia versus eosinophilic pneumonia from daptomycin versus interstitial lung disease/autoimmune process. Acute pulmonary embolism with right lower extremity DVT in the setting of PMR on chronic steroids, immunocompromise Daptomycin was stopped on 4/7. He was started on high-dose steroids and currently on prednisone 40 mg daily to continue for 3 to 4 weeks until seen by PCCM outpatient.  Initially required high flow nasal cannula oxygen currently on 4 L of nasal cannula oxygen. ID on board recommended to continue with IV ceftriaxone, oral doxycycline, Bactrim for PJP prophylaxis. Patient will finish antibiotics by 03/23/23. If bed available for SNF before then, he can be discharged on Bactrim as per ID recommendations.  Patient reports nausea with doxycycline, better with zofran. He remains on 4 lit of [place] oxygen.  Therapy evaluations recommending SNF.    Paroxysmal atrial flutter/fibrillation Rate controlled with metoprolol 12.5 mg  twice daily and on Eliquis for anticoagulation. Echo shows left ventricular ejection fraction of 55 to 60% with no regional wall abnormalities.  Left ventricular diastolic parameters are normal   Pulmonary embolism and right lower extremity DVT On Eliquis for anticoagulation. He remains on 4 lit of [place] oxygen. Wean him off oxygen as tolerated.   Chest pain yesterday, which appears to have resolved.  Elevated troponins. Demand ischemia from respiratory illness? Cardiology consulted by PCCM.  Currently chest pain free.   AAA s/p sciatic graft and possible aortitis Patient was on daptomycin per ID at [facility] which was discontinued for concern for of eosinophilic pneumonia Patient currently on IV ceftriaxone, doxycycline for 2 more days to complete the course He will be Bactrim for PJP prophylaxis.    Acute delirium/anxiety Multifactorial possibly hospital delirium versus from hypoxia versus from sepsis versus from steroids Initially required Precedex gtt, has been weaned off and currently on Seroquel, Atarax and trazodone.    Hyperlipidemia:  On statin/ Crestor 5 mg daily, family does not want the crestor. Will d/c crestor.    PMR:  Pt is on 5 mg of prednisone at home, currently on 40 mg of prednisone daiy.  He will discharged on prednisone until he sees PCCM.  BPH  No issues.   Anemia of chronic disease:  Baseline hemoglobin around 11. Currently at 10.5  continue to monitor.    Chronic leukocytosis:  Recommend outpatient follow up with hematology.  Recheck cbc today.    GERD: On PPI/ Pepcid. Zofran.   Nausea with doxycycline,  On zofran.    Oral Thrush:  Nystatin suspension and oral diflucan ordered.  Improving.   Pressure injury not present on admission.  RN Pressure Injury Documentation: Pressure Injury 03/12/23 Sacrum Mid Stage 1 -  Intact skin with non-blanchable redness of a localized area usually over a bony prominence. Pink non blanching on sacrum (Active)   03/12/23 0800  Location: Sacrum  Location Orientation: Mid  Staging: Stage 1 -  Intact skin with non-blanchable redness of a localized area usually over a bony prominence.  Wound Description (Comments): Pink non blanching on sacrum  Present on Admission: No  Dressing Type Foam - Lift dressing to assess site every shift 03/20/23 2000  Foam dressing in place.   Estimated body mass index is 19.91 kg/m as calculated from the following:   Height as of this encounter: 6' (1.829 m).   Weight as of this encounter: 66.6 kg.  Code Status: DNR DVT Prophylaxis:   apixaban (ELIQUIS) tablet 5 mg   Level of Care: Level of care: Telemetry Cardiac Family Communication: discussed the plan with the wife at bedside.   Disposition Plan:     Remains inpatient appropriate: IV ceftriaxone, waiting for SNF.   Procedures:  Echocardiogram.   Consultants:   ID PALLIATIVE CARE PCCM  Significant events  4/7 Admitted for Acute PE 4/8 Developed Rapid Atrial Flutter-HR 170s-180s , along with respiratory failure requiring NRB, PCCM consulted  4/12 feeling better, HHFNC 60%, 45L, steroids tapered 4/15 PMT consult, on  salter HFNC 8-10L,  4/17 Off precedex since last evening, agitation seems to be improving 4/18 transfer to [facility] from PCCM.   Antimicrobials:   Anti-infectives (From admission, onward)    Start     Dose/Rate Route Frequency Ordered Stop   03/17/23 1500  fluconazole (DIFLUCAN) tablet 200 mg        200 mg Oral  Once 03/17/23 1403 03/17/23 1540   03/15/23 2200  doxycycline (VIBRA-TABS) tablet 100 mg        100 mg Oral Every 12 hours 03/15/23 1109 03/23/23 2359   03/15/23 1400  cefTRIAXone (ROCEPHIN) 2 g in sodium chloride 0.9 % 100 mL IVPB        2 g 200 mL/hr over 30 Minutes Intravenous Daily 03/15/23 1109 03/23/23 2359   03/13/23 2200  vancomycin (VANCOREADY) IVPB 750 mg/150 mL  Status:  Discontinued        750 mg 150 mL/hr over 60 Minutes Intravenous Every 12 hours 03/13/23 1131  03/15/23 1109   03/10/23 2300  vancomycin (VANCOREADY) IVPB 1250 mg/250 mL  Status:  Discontinued        1,250 mg 166.7 mL/hr over 90 Minutes Intravenous Every 18 hours 03/10/23 2243 03/13/23 1131   03/10/23 2000  piperacillin-tazobactam (ZOSYN) IVPB 3.375 g  Status:  Discontinued        3.375 g 12.5 mL/hr over 240 Minutes Intravenous Every 8 hours 03/10/23 1140 03/15/23 1109   03/06/23 1715  vancomycin (VANCOREADY) IVPB 1500 mg/300 mL  Status:  Discontinued        1,500 mg 150 mL/hr over 120 Minutes Intravenous  Once 03/06/23 1628 03/06/23 1628   03/06/23 1628  vancomycin (VANCOREADY) IVPB 1250 mg/250 mL  Status:  Discontinued        1,250 mg 166.7 mL/hr over 90 Minutes Intravenous Every 24 hours 03/06/23 1628 03/10/23 2243   03/06/23 0900  sulfamethoxazole-trimethoprim (BACTRIM DS) 800-160 MG per tablet 1 tablet        1 tablet Oral Once per day on Mon Wed Fri 03/05/23 2141     03/06/23 0800  ceFEPIme (MAXIPIME) 2 g in sodium chloride 0.9 % 100 mL IVPB  Status:  Discontinued        2 g 200 mL/hr over 30 Minutes Intravenous Every 12 hours 03/05/23 2140 03/10/23 1140   03/05/23 1845  DAPTOmycin (CUBICIN) 600 mg in sodium chloride 0.9 % IVPB  Status:  Discontinued        600 mg 124 mL/hr over 30 Minutes Intravenous Daily 03/05/23 1833 03/06/23 1546   03/05/23 1645  ceFEPIme (MAXIPIME) 2 g in sodium chloride 0.9 % 100 mL IVPB        2 g 200 mL/hr over 30 Minutes Intravenous  Once 03/05/23 1639 03/05/23 2018   03/05/23 1645  vancomycin (VANCOREADY) IVPB 1500 mg/300 mL  Status:  Discontinued        1,500 mg 150 mL/hr over 120 Minutes Intravenous  Once 03/05/23 1639 03/05/23 1645        Medications  Scheduled Meds:  apixaban  5 mg Oral BID   aspirin  81 mg Oral Daily   Chlorhexidine Gluconate Cloth  6 each Topical Daily   doxycycline  100 mg Oral Q12H   famotidine  20 mg Oral Daily   feeding supplement  237 mL Oral TID BM   fluorometholone  1 drop Right Eye QHS   folic acid  1 mg  Oral Daily   insulin aspart  0-15 Units Subcutaneous TID WC   melatonin  3 mg Oral QHS   metoprolol tartrate  12.5 mg Oral BID   nystatin  5 mL Oral QID   pantoprazole  40 mg Oral BID   predniSONE  40 mg Oral Q breakfast   QUEtiapine  12.5 mg Oral Daily   QUEtiapine  50 mg Oral QHS   senna-docusate  1 tablet Oral BID   sodium chloride flush  10-40 mL Intracatheter Q12H   sulfamethoxazole-trimethoprim  1 tablet Oral Once per day on Mon Wed Fri   traZODone  200 mg Oral QHS   Continuous Infusions:  sodium chloride Stopped (03/10/23 1127)   cefTRIAXone (ROCEPHIN)  IV 2 g (03/21/23 0922)   PRN Meds:.sodium chloride, acetaminophen **OR** acetaminophen, alum & mag hydroxide-simeth, dicyclomine, hydrALAZINE, hydrOXYzine, lidocaine, loperamide, menthol-cetylpyridinium, ondansetron (ZOFRAN) IV, mouth rinse, sodium chloride flush    Subjective:   Willford Rabideau was seen and examined today.  Haven't slept last night, requesting to increase the dose of atarax at night.  No new complaints.   Objective:   Vitals:   03/21/23 0000 03/21/23 0400 03/21/23 0915 03/21/23 1103  BP: 107/71 119/75 113/73 116/71  Pulse: 80 86 91 74  Resp: 17 17 18 19   Temp:  97.9 F (36.6 C) 98.2 F (36.8 C)   TempSrc:  Oral Oral   SpO2: 93% 91% 96% 95%  Weight:  66.6 kg    Height:        Intake/Output Summary (Last 24 hours) at 03/21/2023 1651 Last data filed at 03/21/2023 0411 Gross per 24 hour  Intake --  Output 650 ml  Net -650 ml    Filed Weights   03/19/23 0552 03/20/23 0615 03/21/23 0400  Weight:  70.1 kg 67.4 kg 66.6 kg     Exam General exam: ill appearing gentleman, not in distress.  Respiratory system: air entry fair, scattered rales on 4l it of [place] oxygen.  Cardiovascular system: S1 & S2 heard, RRR. No JVD,  Gastrointestinal system: Abdomen is nondistended, soft and nontender.  Central nervous system: Alert and oriented to place and person only. Grossly non focal.  Extremities: Symmetric 5  x 5 power. Skin: No rashes, Psychiatry: l. Mood & affect appropriate.         Data Reviewed:  I have personally reviewed following labs and imaging studies   CBC Lab Results  Component Value Date   WBC 30.0 (H) 03/20/2023   RBC 3.41 (L) 03/20/2023   HGB 10.7 (L) 03/20/2023   HCT 32.4 (L) 03/20/2023   MCV 95.0 03/20/2023   MCH 31.4 03/20/2023   PLT 445 (H) 03/20/2023   MCHC 33.0 03/20/2023   RDW 16.1 (H) 03/20/2023   LYMPHSABS 0.6 (L) 03/20/2023   MONOABS 0.6 03/20/2023   EOSABS 0.0 03/20/2023   BASOSABS 0.0 03/20/2023     Last metabolic panel Lab Results  Component Value Date   NA 139 03/19/2023   K 4.2 03/19/2023   CL 101 03/19/2023   CO2 29 03/19/2023   BUN 34 (H) 03/19/2023   CREATININE 0.80 03/19/2023   GLUCOSE 161 (H) 03/19/2023   GFRNONAA >60 03/19/2023   GFRAA >60 01/24/2018   CALCIUM 8.5 (L) 03/19/2023   PHOS 3.3 03/14/2023   PROT 5.6 (L) 03/11/2023   ALBUMIN 2.2 (L) 03/14/2023   LABGLOB 2.5 03/21/2016   AGRATIO 1.6 03/21/2016   BILITOT 0.8 03/11/2023   ALKPHOS 78 03/11/2023   AST 23 03/11/2023   ALT 39 03/11/2023   ANIONGAP 9 03/19/2023    CBG (last 3)  Recent Labs    03/21/23 0621 03/21/23 1132 03/21/23 1618  GLUCAP 92 126* 206*       Coagulation Profile: No results for input(s): "INR", "PROTIME" in the last 168 hours.   Radiology Studies: DG CHEST PORT 1 VIEW  Result Date: 03/20/2023 CLINICAL DATA:  89 year old male with respiratory failure. Diagnosed with right lung PE or earlier this month. EXAM: PORTABLE CHEST 1 VIEW COMPARISON:  Portable chest 03/12/2023 and earlier. FINDINGS: Portable AP semi upright view at 0633 hours. Stable right PICC line. Stable somewhat low lung volumes. Stable cardiac size and mediastinal contours. Widespread abnormal course and confluent pulmonary opacity in both lungs, although peripheral right lung ventilation has improved. Left lung ventilation not significantly changed. Most of this opacity is new  from February radiographs. No pneumothorax. No definite pleural effusion. Stable visualized osseous structures.  Negative visible bowel gas. IMPRESSION: 1. Widespread bilateral pulmonary opacity with some improved right lung ventilation since 03/12/2023. Left lung not significantly changed. This is nonspecific but might be sequelae of ARDS following pulmonary emboli. 2. Stable right PICC line. No new cardiopulmonary abnormality. Electronically Signed   By: Odessa Fleming M.D.   On: 03/20/2023 08:44       Kathlen Mody M.D. [facility] 03/21/2023, 4:51 PM  Available via Epic secure chat 7am-7pm After 7 pm, please refer to night coverage provider listed on amion.

## 2023-03-22 DIAGNOSIS — J9601 Acute respiratory failure with hypoxia: Secondary | ICD-10-CM | POA: Diagnosis not present

## 2023-03-22 DIAGNOSIS — I714 Abdominal aortic aneurysm, without rupture, unspecified: Secondary | ICD-10-CM | POA: Diagnosis not present

## 2023-03-22 DIAGNOSIS — N400 Enlarged prostate without lower urinary tract symptoms: Secondary | ICD-10-CM | POA: Diagnosis not present

## 2023-03-22 DIAGNOSIS — I2699 Other pulmonary embolism without acute cor pulmonale: Secondary | ICD-10-CM | POA: Diagnosis not present

## 2023-03-22 LAB — BASIC METABOLIC PANEL
Anion gap: 7 (ref 5–15)
BUN: 29 mg/dL — ABNORMAL HIGH (ref 8–23)
CO2: 30 mmol/L (ref 22–32)
Calcium: 8.4 mg/dL — ABNORMAL LOW (ref 8.9–10.3)
Chloride: 100 mmol/L (ref 98–111)
Creatinine, Ser: 0.8 mg/dL (ref 0.61–1.24)
GFR, Estimated: >60 mL/min (ref >60)
Glucose, Bld: 98 mg/dL (ref 70–99)
Potassium: 3.7 mmol/L (ref 3.5–5.1)
Sodium: 137 mmol/L (ref 135–145)

## 2023-03-22 LAB — CBC WITH DIFFERENTIAL/PLATELET
Abs Immature Granulocytes: 0.24 10*3/uL — ABNORMAL HIGH (ref 0.00–0.07)
Basophils Absolute: 0.1 10*3/uL (ref 0.0–0.1)
Basophils Relative: 0 %
Eosinophils Absolute: 0.4 10*3/uL (ref 0.0–0.5)
Eosinophils Relative: 2 %
HCT: 31.5 % — ABNORMAL LOW (ref 39.0–52.0)
Hemoglobin: 10.2 g/dL — ABNORMAL LOW (ref 13.0–17.0)
Immature Granulocytes: 1 %
Lymphocytes Relative: 5 %
Lymphs Abs: 1.2 10*3/uL (ref 0.7–4.0)
MCH: 30.7 pg (ref 26.0–34.0)
MCHC: 32.4 g/dL (ref 30.0–36.0)
MCV: 94.9 fL (ref 80.0–100.0)
Monocytes Absolute: 1.4 10*3/uL — ABNORMAL HIGH (ref 0.1–1.0)
Monocytes Relative: 6 %
Neutro Abs: 19.7 10*3/uL — ABNORMAL HIGH (ref 1.7–7.7)
Neutrophils Relative %: 86 %
Platelets: 393 10*3/uL (ref 150–400)
RBC: 3.32 MIL/uL — ABNORMAL LOW (ref 4.22–5.81)
RDW: 15.9 % — ABNORMAL HIGH (ref 11.5–15.5)
WBC: 22.9 10*3/uL — ABNORMAL HIGH (ref 4.0–10.5)
nRBC: 0 % (ref 0.0–0.2)

## 2023-03-22 LAB — GLUCOSE, CAPILLARY
Glucose-Capillary: 78 mg/dL (ref 70–99)
Glucose-Capillary: 97 mg/dL (ref 70–99)
Glucose-Capillary: 184 mg/dL — ABNORMAL HIGH (ref 70–99)
Glucose-Capillary: 128 mg/dL — ABNORMAL HIGH (ref 70–99)

## 2023-03-22 LAB — MAGNESIUM: Magnesium: 1.9 mg/dL (ref 1.7–2.4)

## 2023-03-22 NOTE — Progress Notes (Signed)
PROGRESS NOTE    [name]  ZOX:[id number] DOB: 1930-08-25 DOA: 03/05/2023 PCP: [name], MD    Chief Complaint  Patient presents with   Code Sepsis    Brief Narrative:  89 yr old male with significant pmhx of polymyalgia rheumatica on chronic steroids, recent hospitalization for aortitis, CHF, chronic leukocytosis, AAA with S/P stent graft in 2018, HLD, and DDD was admitted with an acute PE at [facility] ED and initially complaining of generalized weakness on 4/7. Patient with worsening respiratory distress and hypoxia on 4/8/ requiring non-re breather mask, found to have PE and DVT. Patient being managed by [facility], Cardiology, and Infectious Disease. PCCM consulted by ID to assist in managing the acute hypoxic respiratory failure.    Assessment & Plan:   Principal Problem:   Acute pulmonary embolism Active Problems:   Generalized weakness   PMR (polymyalgia rheumatica)   Hyperlipidemia   AAA (abdominal aortic aneurysm) without rupture (HCC)   Acute respiratory failure with hypoxia   Acute hyponatremia   Elevated troponin   Lactic acidosis   HCAP (healthcare-associated pneumonia)   Chronic diastolic CHF (congestive heart failure)   BPH (benign prostatic hyperplasia)   Pulmonary embolism   Eosinophilic pneumonia   Aortitis   Atrial flutter   FUO (fever of unknown origin)   S/P insertion of endovascular thoracic aortic stent graft   Drug-induced insomnia   Anxiety   DVT, lower extremity, distal, acute, right   PAF (paroxysmal atrial fibrillation)   Acute metabolic encephalopathy   Paroxysmal A-fib   Pressure injury of skin   Delirium   Acute eosinophilic pneumonia  #1 acute hypoxic respiratory failure in the setting of multifocal pneumonia versus eosinophilic pneumonia from daptomycin versus interstitial lung disease/autoimmune process/acute PE with right lower extremity DVT in the setting of PMR on chronic steroids/immunocompromise state -Patient was seen in  consultation by ID, daptomycin discontinued on 03/05/2023 due to concerns for possible eosinophilic pneumonia. -Patient with some clinical improvement currently on 1 L nasal cannula with sats of 91%. -Patient noted to have missed on high dose steroids which are being tapered down patient currently on prednisone 40 mg daily recommended to continue for the next 3 to 4 weeks at this dose until seen by PCCM in the outpatient setting. -ID recommended continuation of IV Rocephin, oral doxycycline, Bactrim for PJP prophylaxis. -Patient to finish course of antibiotics 03/23/2023. -ID recommending continuation of Bactrim for pneumocystis prophylaxis while on high-dose steroids. -Will need outpatient follow-up with ID at [facility] postdischarge.  2.  Paroxysmal atrial fibrillation/atrial flutter -Seen in consultation by cardiology and felt likely secondary to acute pulmonary issues. -Noted to have converted on IV amiodarone which was continued for 24 hours and subsequently discontinued. -Continue low-dose Lopressor for rate control, Eliquis for anticoagulation.  3.  PE/right lower extremity DVT -Noted on CT angiogram chest. -Patient noted to have elevated troponins were likely secondary to demand ischemia. -Lower extremity Dopplers done positive for right lower extremity DVT. -2D echo done with abnormal septal motion, EF 55 to 60%,NWMA, no evidence of cor pulmonale in the setting of recent PE. -Was on IV heparin and transition to Eliquis. -Improving clinically. -Outpatient follow-up.  4.  AAA status post graft and possible aortitis -Patient noted to have been on daptomycin per ID at [facility] which was discontinued during this hospitalization due to concerns for eosinophilic pneumonia. -Currently on IV Rocephin, doxycycline and antibiotics to be completed tomorrow. -Continue Bactrim for PJP prophylaxis per ID recommendations while on steroids. -Outpatient follow-up  with ID at [facility].  5.  Acute  delirium/anxiety -Possibly secondary to hospital delirium versus from hypoxia versus steroid-induced. -Clinical improvement.  6.  Hyperlipidemia -Patient started on a statin however family does not want statins and as such this was discontinued.  6.  PMR -Patient noted to chronically be on 5 mg of prednisone at home currently on 40 mg of prednisone daily secondary to problem #1 until follow-up with pulmonary in the outpatient setting.  7.  BPH -Stable.  8.  Anemia of chronic disease -H&H stable. -No overt bleeding.  9. chronic leukocytosis -Patient noted to have a chronic leukocytosis could be secondary to steroid induced. -Labs today with some improvement with CBC. -Patient currently on a course of antibiotics. -Will need outpatient follow-up with hematology.  10.  GERD -Continue PPI, Pepcid.  Zofran.  11.  Nausea with doxycycline -Continue IV antiemetics.  12.  Oral thrush -Nystatin suspension ordered however per RN patient and family refused due to concerns it might be a statin. -Status post dose of fluconazole.  13.  Pressure injury, not present on admission Pressure Injury 03/12/23 Sacrum Mid Stage 1 -  Intact skin with non-blanchable redness of a localized area usually over a bony prominence. Pink non blanching on sacrum (Active)  03/12/23 0800  Location: Sacrum  Location Orientation: Mid  Staging: Stage 1 -  Intact skin with non-blanchable redness of a localized area usually over a bony prominence.  Wound Description (Comments): Pink non blanching on sacrum  Present on Admission: No        DVT prophylaxis: Eliquis Code Status: DNR Family Communication: Updated patient and wife at bedside. Disposition: SNF hopefully in the next 24 hours  Status is: Inpatient Remains inpatient appropriate because: Severity of illness   Consultants:  Cardiology: Dr. Eden Emms 03/06/2023 PCCM: Dr. Francine Graven 03/06/2023 ID: Dr.Van Dam 03/06/2023 Palliative care: Dr. Patterson Hammersmith  03/13/2023  Procedures:  CT angiogram chest 03/05/2023 CT head without contrast 03/05/2023 Chest x-ray 03/05/2023, 03/07/2023, 03/09/2023, 03/10/2023, 03/12/2023, 03/20/2023 2D echo 03/06/2023 Lower extremity Dopplers 03/07/2023 Upper extremity Dopplers 03/07/2023  Antimicrobials:  Anti-infectives (From admission, onward)    Start     Dose/Rate Route Frequency Ordered Stop   03/17/23 1500  fluconazole (DIFLUCAN) tablet 200 mg        200 mg Oral  Once 03/17/23 1403 03/17/23 1540   03/15/23 2200  doxycycline (VIBRA-TABS) tablet 100 mg        100 mg Oral Every 12 hours 03/15/23 1109 03/23/23 2359   03/15/23 1400  cefTRIAXone (ROCEPHIN) 2 g in sodium chloride 0.9 % 100 mL IVPB        2 g 200 mL/hr over 30 Minutes Intravenous Daily 03/15/23 1109 03/23/23 2359   03/13/23 2200  vancomycin (VANCOREADY) IVPB 750 mg/150 mL  Status:  Discontinued        750 mg 150 mL/hr over 60 Minutes Intravenous Every 12 hours 03/13/23 1131 03/15/23 1109   03/10/23 2300  vancomycin (VANCOREADY) IVPB 1250 mg/250 mL  Status:  Discontinued        1,250 mg 166.7 mL/hr over 90 Minutes Intravenous Every 18 hours 03/10/23 2243 03/13/23 1131   03/10/23 2000  piperacillin-tazobactam (ZOSYN) IVPB 3.375 g  Status:  Discontinued        3.375 g 12.5 mL/hr over 240 Minutes Intravenous Every 8 hours 03/10/23 1140 03/15/23 1109   03/06/23 1715  vancomycin (VANCOREADY) IVPB 1500 mg/300 mL  Status:  Discontinued        1,500 mg 150 mL/hr over  120 Minutes Intravenous  Once 03/06/23 1628 03/06/23 1628   03/06/23 1628  vancomycin (VANCOREADY) IVPB 1250 mg/250 mL  Status:  Discontinued        1,250 mg 166.7 mL/hr over 90 Minutes Intravenous Every 24 hours 03/06/23 1628 03/10/23 2243   03/06/23 0900  sulfamethoxazole-trimethoprim (BACTRIM DS) 800-160 MG per tablet 1 tablet        1 tablet Oral Once per day on Mon Wed Fri 03/05/23 2141     03/06/23 0800  ceFEPIme (MAXIPIME) 2 g in sodium chloride 0.9 % 100 mL IVPB  Status:  Discontinued        2  g 200 mL/hr over 30 Minutes Intravenous Every 12 hours 03/05/23 2140 03/10/23 1140   03/05/23 1845  DAPTOmycin (CUBICIN) 600 mg in sodium chloride 0.9 % IVPB  Status:  Discontinued        600 mg 124 mL/hr over 30 Minutes Intravenous Daily 03/05/23 1833 03/06/23 1546   03/05/23 1645  ceFEPIme (MAXIPIME) 2 g in sodium chloride 0.9 % 100 mL IVPB        2 g 200 mL/hr over 30 Minutes Intravenous  Once 03/05/23 1639 03/05/23 2018   03/05/23 1645  vancomycin (VANCOREADY) IVPB 1500 mg/300 mL  Status:  Discontinued        1,500 mg 150 mL/hr over 120 Minutes Intravenous  Once 03/05/23 1639 03/05/23 1645         Subjective: Laying in bed.  Wife at bedside.  Feels shortness of breath has improved since admission.  No chest pain.  No stomach pain.  Objective: Vitals:   03/22/23 0439 03/22/23 0940 03/22/23 1038 03/22/23 1130  BP: (!) 142/72 106/70 116/79 114/64  Pulse: 87 82 81 75  Resp: Temp: 97.8 F (36.6 C)  97.8 F (36.6 C)   TempSrc: Oral  Oral   SpO2: 91%  93% 91%  Weight: 65.3 kg     Height:        Intake/Output Summary (Last 24 hours) at 03/22/2023 1757 Last data filed at 03/22/2023 1338 Gross per 24 hour  Intake 999.93 ml  Output 600 ml  Net 399.93 ml   Filed Weights   03/20/23 0615 03/21/23 0400 03/22/23 0439  Weight: 67.4 kg 66.6 kg 65.3 kg    Examination:  General exam: Appears calm and comfortable.  Respiratory system: Some coarse breath sounds upper lobes otherwise clear.  No wheezing.  Fair air movement.  Speaking in full sentences.  Cardiovascular system: Regular rate rhythm no murmurs rubs or gallops.  No JVD.  No lower extremity edema.  Gastrointestinal system: Abdomen is nondistended, soft and nontender. No organomegaly or masses felt. Normal bowel sounds heard. Central nervous system: Alert and oriented. No focal neurological deficits. Extremities: Symmetric 5 x 5 power. Skin: No rashes, lesions or ulcers Psychiatry: Judgement and insight appear  normal. Mood & affect appropriate.     Data Reviewed: I have personally reviewed following labs and imaging studies  CBC: Recent Labs  Lab 03/16/23 0435 03/19/23 1130 03/20/23 1210 03/22/23 1000  WBC 29.8* 33.2* 30.0* 22.9*  NEUTROABS  --  31.9* 28.8* 19.7*  HGB 10.5* 10.1* 10.7* 10.2*  HCT 31.1* 31.3* 32.4* 31.5*  MCV 94.0 96.9 95.0 94.9  PLT 379 440* 445* 393    Basic Metabolic Panel: Recent Labs  Lab 03/16/23 0435 03/19/23 1130 03/22/23 1000  NA 139 139 137  K 4.1 4.2 3.7  CL 105 101 100  CO2 24 29 30  GLUCOSE 154* 161* 98  BUN 35* 34* 29*  CREATININE 0.92 0.80 0.80  CALCIUM 8.0* 8.5* 8.4*  MG  --   --  1.9    GFR: Estimated Creatinine Clearance: 56.7 mL/min (by C-G formula based on SCr of 0.8 mg/dL).  Liver Function Tests: No results for input(s): "AST", "ALT", "ALKPHOS", "BILITOT", "PROT", "ALBUMIN" in the last 168 hours.  CBG: Recent Labs  Lab 03/21/23 1618 03/21/23 2104 03/22/23 0602 03/22/23 1115 03/22/23 1639  GLUCAP 206* 106* 78 97 184*     No results found for this or any previous visit (from the past 240 hour(s)).       Radiology Studies: No results found.      Scheduled Meds:  apixaban  5 mg Oral BID   aspirin  81 mg Oral Daily   Chlorhexidine Gluconate Cloth  6 each Topical Daily   doxycycline  100 mg Oral Q12H   famotidine  20 mg Oral Daily   feeding supplement  237 mL Oral TID BM   fluorometholone  1 drop Right Eye QHS   folic acid  1 mg Oral Daily   insulin aspart  0-15 Units Subcutaneous TID WC   melatonin  3 mg Oral QHS   metoprolol tartrate  12.5 mg Oral BID   nystatin  5 mL Oral QID   pantoprazole  40 mg Oral BID   predniSONE  40 mg Oral Q breakfast   QUEtiapine  12.5 mg Oral Daily   QUEtiapine  50 mg Oral QHS   senna-docusate  1 tablet Oral BID   sodium chloride flush  10-40 mL Intracatheter Q12H   sulfamethoxazole-trimethoprim  1 tablet Oral Once per day on Mon Wed Fri   traZODone  200 mg Oral QHS    Continuous Infusions:  sodium chloride Stopped (03/10/23 1127)   cefTRIAXone (ROCEPHIN)  IV 200 mL/hr at 03/22/23 1338     LOS: 16 days    Time spent: 40 minutes    Ramiro Harvest, MD [facility]   To contact the attending provider between 7A-7P or the covering provider during after hours 7P-7A, please log into the web site [email or web address] and access using universal  password for that web site. If you do not have the password, please call the hospital operator.  03/22/2023, 5:57 PM

## 2023-03-22 NOTE — Progress Notes (Signed)
Physical Therapy Treatment Patient Details Name: [name] MRN: [id number] DOB: [birth year] Today's Date: 03/22/2023   History of Present Illness 89 yr old male  was admitted with an acute PE at [facility] ED and initially complaining of generalized weakness on 4/7. Patient developed SVT on the morning of 4/8 along with respiratory distress and hypoxia requiring non-re breather mask. [id number] rheumatica on chronic steroids, recent hospitalization for aortitis, CHF, chronic leukocytosis, AAA with S/P stent graft in 2018, HLD, and DDD    PT Comments    Pt with fair tolerance to treatment today. Pt initially declined PT however with encouragement was agreeable to bed level therex. Pt was then able to sit EOB for ~30 seconds before laying back down. Pt received on 0.5L at 95%. Pt titrated to RA and dropped to 85% with bed mobility however was able to recover to 93% on 1L with pursed lip breathing. Pt left at 94% on 0.5L. Pt with continued anxiety with movement. No change in DC/DME recs at this time. Pt and spouse anticipate DC to SNF tomorrow. PT will continue to follow.   Recommendations for follow up therapy are one component of a multi-disciplinary discharge planning process, led by the attending physician.  Recommendations may be updated based on patient status, additional functional criteria and insurance authorization.  Follow Up Recommendations  Can patient physically be transported by private vehicle: No    Assistance Recommended at Discharge Frequent or constant Supervision/Assistance  Patient can return home with the following Assistance with cooking/housework;Direct supervision/assist for medications management;Direct supervision/assist for financial management;Assist for transportation;Help with stairs or ramp for entrance;A lot of help with walking and/or transfers;A lot of help with bathing/dressing/bathroom   Equipment Recommendations  Other (comment) (Per accepting facility)     Recommendations for Other Services       Precautions / Restrictions Precautions Precautions: Fall;Other (comment) Precaution Comments: watch sats and BP Restrictions Weight Bearing Restrictions: No     Mobility  Bed Mobility Overal bed mobility: Needs Assistance Bed Mobility: Supine to Sit, Sit to Supine     Supine to sit: Mod assist Sit to supine: Supervision   General bed mobility comments: pt able to advance LEs to EOB, min assist to raise trunk and bring R hip for feet to be on the floor, pt able to return to supine on his own    Transfers                        Ambulation/Gait                   Stairs             Wheelchair Mobility    Modified Rankin (Stroke Patients Only)       Balance Overall balance assessment: Needs assistance Sitting-balance support: Feet supported, No upper extremity supported Sitting balance-Leahy Scale: Fair Sitting balance - Comments: Able to tolerate 30 seconds seated EOB before requesting to lay down.                                    Cognition Arousal/Alertness: Awake/alert Behavior During Therapy: Anxious, Restless Overall Cognitive Status: Within Functional Limits for tasks assessed  Exercises General Exercises - Lower Extremity Ankle Circles/Pumps: AROM, Both, 20 reps, Supine Hip ABduction/ADduction: AROM, Both, 5 reps, Supine Other Exercises Other Exercises: Punching bag x30 secs    General Comments General comments (skin integrity, edema, etc.): Pt received on 0.5L at 95%. Pt titrated to RA and dropped to 85% with bed mobility however was able to recover to 93% on 1L with pursed lip breathing. Pt left at 94% on 0.5L. Pt with continued anxiety with movement.      Pertinent Vitals/Pain Pain Assessment Pain Assessment: Faces Faces Pain Scale: Hurts a little bit Pain Location: chest with shortness of breath Pain  Descriptors / Indicators: Discomfort, Grimacing, Guarding Pain Intervention(s): Monitored during session, Limited activity within patient's tolerance    Home Living                          Prior Function            PT Goals (current goals can now be found in the care plan section) Progress towards PT goals: Progressing toward goals    Frequency    Min 1X/week      PT Plan Current plan remains appropriate    Co-evaluation              AM-PAC PT "6 Clicks" Mobility   Outcome Measure  Help needed turning from your back to your side while in a flat bed without using bedrails?: A Lot Help needed moving from lying on your back to sitting on the side of a flat bed without using bedrails?: A Lot Help needed moving to and from a bed to a chair (including a wheelchair)?: Total Help needed standing up from a chair using your arms (e.g., wheelchair or bedside chair)?: Total Help needed to walk in hospital room?: Total Help needed climbing 3-5 steps with a railing? : Total 6 Click Score: 8    End of Session Equipment Utilized During Treatment: Oxygen Activity Tolerance: Patient limited by fatigue Patient left: in bed;with call bell/phone within reach;with family/visitor present Nurse Communication: Mobility status PT Visit Diagnosis: Difficulty in walking, not elsewhere classified (R26.2);Other abnormalities of gait and mobility (R26.89);Unsteadiness on feet (R26.81)     Time: 1610-9604 PT Time Calculation (min) (ACUTE ONLY): 21 min  Charges:  $Therapeutic Exercise: 8-22 mins                     Shela Nevin, PT, DPT Acute Rehab Services [phone]    [name] 03/22/2023, 4:56 PM

## 2023-03-22 NOTE — Plan of Care (Signed)
  Problem: Education: Goal: Knowledge of General Education information will improve Description: Including pain rating scale, medication(s)/side effects and non-pharmacologic comfort measures Outcome: Progressing   Problem: Health Behavior/Discharge Planning: Goal: Ability to manage health-related needs will improve Outcome: Progressing   Problem: Clinical Measurements: Goal: Ability to maintain clinical measurements within normal limits will improve Outcome: Progressing Goal: Will remain free from infection Outcome: Progressing Goal: Diagnostic test results will improve Outcome: Progressing Goal: Respiratory complications will improve Outcome: Progressing Goal: Cardiovascular complication will be avoided Outcome: Progressing   Problem: Activity: Goal: Risk for activity intolerance will decrease Outcome: Progressing   Problem: Nutrition: Goal: Adequate nutrition will be maintained Outcome: Progressing   Problem: Elimination: Goal: Will not experience complications related to bowel motility Outcome: Progressing Goal: Will not experience complications related to urinary retention Outcome: Progressing   Problem: Safety: Goal: Ability to remain free from injury will improve Outcome: Progressing   Problem: Education: Goal: Ability to describe self-care measures that may prevent or decrease complications (Diabetes Survival Skills Education) will improve Outcome: Progressing   Problem: Coping: Goal: Ability to adjust to condition or change in health will improve Outcome: Progressing

## 2023-03-23 DIAGNOSIS — E871 Hypo-osmolality and hyponatremia: Secondary | ICD-10-CM | POA: Diagnosis not present

## 2023-03-23 DIAGNOSIS — J8282 Acute eosinophilic pneumonia: Secondary | ICD-10-CM | POA: Diagnosis not present

## 2023-03-23 DIAGNOSIS — M353 Polymyalgia rheumatica: Secondary | ICD-10-CM | POA: Diagnosis not present

## 2023-03-23 DIAGNOSIS — I2699 Other pulmonary embolism without acute cor pulmonale: Secondary | ICD-10-CM | POA: Diagnosis not present

## 2023-03-23 LAB — CBC WITH DIFFERENTIAL/PLATELET
Abs Immature Granulocytes: 0.19 10*3/uL — ABNORMAL HIGH (ref 0.00–0.07)
Basophils Absolute: 0 10*3/uL (ref 0.0–0.1)
Basophils Relative: 0 %
Eosinophils Absolute: 0 10*3/uL (ref 0.0–0.5)
Eosinophils Relative: 0 %
HCT: 28.8 % — ABNORMAL LOW (ref 39.0–52.0)
Hemoglobin: 9.8 g/dL — ABNORMAL LOW (ref 13.0–17.0)
Immature Granulocytes: 1 %
Lymphocytes Relative: 4 %
Lymphs Abs: 0.8 10*3/uL (ref 0.7–4.0)
MCH: 31.7 pg (ref 26.0–34.0)
MCHC: 34 g/dL (ref 30.0–36.0)
MCV: 93.2 fL (ref 80.0–100.0)
Monocytes Absolute: 1.2 10*3/uL — ABNORMAL HIGH (ref 0.1–1.0)
Monocytes Relative: 5 %
Neutro Abs: 20.1 10*3/uL — ABNORMAL HIGH (ref 1.7–7.7)
Neutrophils Relative %: 90 %
Platelets: 338 10*3/uL (ref 150–400)
RBC: 3.09 MIL/uL — ABNORMAL LOW (ref 4.22–5.81)
RDW: 15.9 % — ABNORMAL HIGH (ref 11.5–15.5)
WBC: 22.3 10*3/uL — ABNORMAL HIGH (ref 4.0–10.5)
nRBC: 0 % (ref 0.0–0.2)

## 2023-03-23 LAB — BASIC METABOLIC PANEL
Anion gap: 9 (ref 5–15)
BUN: 29 mg/dL — ABNORMAL HIGH (ref 8–23)
CO2: 28 mmol/L (ref 22–32)
Calcium: 8.3 mg/dL — ABNORMAL LOW (ref 8.9–10.3)
Chloride: 98 mmol/L (ref 98–111)
Creatinine, Ser: 0.87 mg/dL (ref 0.61–1.24)
GFR, Estimated: >60 mL/min (ref >60)
Glucose, Bld: 95 mg/dL (ref 70–99)
Potassium: 4.1 mmol/L (ref 3.5–5.1)
Sodium: 135 mmol/L (ref 135–145)

## 2023-03-23 LAB — GLUCOSE, CAPILLARY
Glucose-Capillary: 93 mg/dL (ref 70–99)
Glucose-Capillary: 150 mg/dL — ABNORMAL HIGH (ref 70–99)

## 2023-03-23 MED ORDER — PANTOPRAZOLE SODIUM 40 MG PO TBEC: DELAYED_RELEASE_TABLET | ORAL | 0 refills | Status: DC

## 2023-03-23 MED ORDER — DICLOFENAC SODIUM 1 % EX GEL: 2.0000 g | Freq: Four times a day (QID) | CUTANEOUS | Status: DC | PRN

## 2023-03-23 MED ORDER — HYDROXYZINE HCL 50 MG PO TABS: 50.0000 mg | ORAL_TABLET | Freq: Four times a day (QID) | ORAL | 0 refills | Status: DC | PRN

## 2023-03-23 MED ORDER — BUTALBITAL-APAP-CAFFEINE 50-325-40 MG PO TABS: 1.0000 | ORAL_TABLET | Freq: Every day | ORAL | 0 refills | Status: DC | PRN

## 2023-03-23 MED ORDER — SULFAMETHOXAZOLE-TRIMETHOPRIM 800-160 MG PO TABS: 1.0000 | ORAL_TABLET | ORAL | Status: DC

## 2023-03-23 MED ORDER — QUETIAPINE FUMARATE 50 MG PO TABS: 50.0000 mg | ORAL_TABLET | Freq: Every day | ORAL | Status: DC

## 2023-03-23 MED ORDER — DICLOFENAC SODIUM 1 % EX GEL: 2.0000 g | Freq: Four times a day (QID) | CUTANEOUS | 0 refills | Status: DC | PRN

## 2023-03-23 MED ORDER — QUETIAPINE FUMARATE 25 MG PO TABS: 12.5000 mg | ORAL_TABLET | Freq: Every day | ORAL | Status: DC

## 2023-03-23 MED ORDER — ASPIRIN 81 MG PO CHEW: 81.0000 mg | CHEWABLE_TABLET | Freq: Every day | ORAL | Status: DC

## 2023-03-23 MED ORDER — ENSURE ENLIVE PO LIQD: 237.0000 mL | Freq: Three times a day (TID) | ORAL | 12 refills | Status: DC

## 2023-03-23 MED ORDER — PREDNISONE 20 MG PO TABS: 40.0000 mg | ORAL_TABLET | Freq: Every day | ORAL | Status: DC

## 2023-03-23 MED ORDER — MELATONIN 3 MG PO TABS: 3.0000 mg | ORAL_TABLET | Freq: Every day | ORAL | 0 refills | Status: DC

## 2023-03-23 MED ORDER — IBUPROFEN 200 MG PO TABS
400.0000 mg | ORAL_TABLET | Freq: Four times a day (QID) | ORAL | Status: DC | PRN
  Administered 2023-03-23: 400 mg via ORAL
  Filled 2023-03-23: qty 2

## 2023-03-23 MED ORDER — APIXABAN 5 MG PO TABS: 5.0000 mg | ORAL_TABLET | Freq: Two times a day (BID) | ORAL | 1 refills | Status: DC

## 2023-03-23 MED ORDER — FAMOTIDINE 20 MG PO TABS: 20.0000 mg | ORAL_TABLET | Freq: Every day | ORAL | Status: DC

## 2023-03-23 MED ORDER — ACETAMINOPHEN 325 MG PO TABS: 650.0000 mg | ORAL_TABLET | Freq: Four times a day (QID) | ORAL | Status: DC | PRN

## 2023-03-23 MED ORDER — METOPROLOL TARTRATE 25 MG PO TABS: 12.5000 mg | ORAL_TABLET | Freq: Two times a day (BID) | ORAL | Status: DC

## 2023-03-23 MED ORDER — DICYCLOMINE HCL 10 MG/5ML PO SOLN: 10.0000 mg | Freq: Three times a day (TID) | ORAL | 12 refills | Status: DC | PRN

## 2023-03-23 NOTE — Discharge Summary (Addendum)
Physician Discharge Summary  ELIC VENCILL GUY:[id number] DOB: Nov 28, 1930 DOA: 03/05/2023  PCP: [name], MD  Admit date: 03/05/2023 Discharge date: 03/23/2023  Time spent: 60 minutes  Recommendations for Outpatient Follow-up:  Follow-up with MD at skilled nursing facility.  Patient will need a basic metabolic profile done in 1 week to follow-up on electrolytes and renal function. Ambulatory referral to hematology made for follow-up on chronic leukocytosis. Follow-up with ID at [facility] in 2 to 3 weeks. Follow-up with Dr. Francine Graven, pulmonary on 04/20/2023 at 11 AM.   Discharge Diagnoses:  Principal Problem:   Acute pulmonary embolism Active Problems:   Generalized weakness   PMR (polymyalgia rheumatica)   Hyperlipidemia   AAA (abdominal aortic aneurysm) without rupture (HCC)   Acute respiratory failure with hypoxia   Acute hyponatremia   Elevated troponin   Lactic acidosis   HCAP (healthcare-associated pneumonia)   Chronic diastolic CHF (congestive heart failure)   BPH (benign prostatic hyperplasia)   Pulmonary embolism   Eosinophilic pneumonia   Aortitis   Atrial flutter   FUO (fever of unknown origin)   S/P insertion of endovascular thoracic aortic stent graft   Drug-induced insomnia   Anxiety   DVT, lower extremity, distal, acute, right   PAF (paroxysmal atrial fibrillation)   Acute metabolic encephalopathy   Paroxysmal A-fib   Pressure injury of skin   Delirium   Acute eosinophilic pneumonia   Discharge Condition: Stable and improved.  Diet recommendation: Regular  Filed Weights   03/21/23 0400 03/22/23 0439 03/23/23 0600  Weight: 66.6 kg 65.3 kg 65 kg    History of present illness:  HPI per Dr. Tiney Rouge NEHEMYAH Alexander is a 89 y.o. male with medical history significant for polymyalgia rheumatica on chronic prednisone therapy, AAA status post endovascular stent graft in September 2018, chronic diastolic heart failure, recent diagnosis of aortitis, chronic  leukocytosis, who is admitted to [facility] on 03/05/2023 with acute pulmonary embolism after presenting from home to [facility] ED complaining of generalized weakness.    The [facility] the end of February 2024 for generalized weakness.  Subsequently, he was admitted to [facility] with similar initial complaint, before being discharged home on 02/13/2023.  During hospitalization at [facility], there was reportedly concern for potential aortitis, for which the patient was started on long-term current spectrum of antibiotics via PICC line, including daptomycin, Rocephin, and Bactrim.  He notes good interval compliance with these antibiotics.   However, over the last 1 to 2 days, he is noted progressive generalized weakness in the absence of any acute focal weakness.  He qualifies his generalized weakness is being significant enough to prevent him from getting out of bed over the last day, which is quite different than his baseline functionality and activity level.  Denies any associated acute focal weakness.    He has a history of polymyalgia rheumatica and has been on chronic steroid therapy for multiple decades.  Recently, there have been outpatient attempts at reducing his dose of chronic prednisone, and he notes that the most recent dose adjustment down to 15 mg of daily prednisone occurred a few days prior to the onset of his presenting generalized weakness.   He denies any recent subjective fever, chills, rigors, or generalized myalgias.  Denies any new headache, neck stiffness, rash, cough, hemoptysis, shortness of breath, abdominal pain, diarrhea, dysuria or gross hematuria.  He also denies any recent chest pain, palpitations, diaphoresis, nausea, vomiting, dizziness, presyncope, or syncope.  No recent worsening of peripheral edema, or any recent/new lower extremity erythema or calf tenderness.  Denies any known history of prior pulmonary embolism.   Aside from the 2  hospitalizations over the last 6 weeks, denies any additional periods of prolonged diminished ambulatory activity as of recent.  No recent trauma.  No known history of underlying malignancy.  Not on any blood thinners as an outpatient, including no aspirin.   Per chart review, it appears that he also has chronic leukocytosis with chart review revealing most recent prior white blood cell count of 18.8 on 02/28/2023, with other blood cell data points notable for 25.4 on 02/07/2023.   Denies any known baseline supplemental oxygen requirements.         ED Course:  Vital signs in the ED were notable for the following: Temperature max 99.8; heart rate initially in the low 100s, socially decreasing to the 80s following interval IV fluids; initial blood pressure of 94/66, which is increased to 113/68 following interval IV fluids, as further quantified below; respiratory rate 18-25, oxygen saturation initially noted to be 87% on room air, S[place] increasing into the range of 98 to 100% on 2 L nasal cannula.   Labs were notable for the following: CMP notable for the following: Sodium 131 compared to 137 on 02/27/2023, potassium 4.1, bicarbonate 22, creatinine 1.17 compared to 0.98 on 02/27/2023, glucose 131, calcium, adjusted for mild hypoalbuminemia noted to be 9.2, albumin 2.2, liver enzymes within normal limits.  High-sensitivity troponin I initially 36, without any prior high sensitive troponin I did points available for reported comparison.  ESR 105 compared to most recent prior value of 69 on 01/25/2023, CRP 12.8 compared to 75 on 02/28/2023.  CBC notable for the following: Open cell count 20,000 with 89% neutrophils, hemoglobin 10.26 with normocytic/recurrent properties and relative to most recent prior hemoglobin data point of 11.2 0.4-24, bili 320.  Urinalysis notable for no white blood cells, no bacteria, leukocyte esterase/nitrate negative, small hemoglobin with 6-10 RBCs.  Blood cultures x 2 were collected prior  to initiation of IV antibiotics.  COVID, influenza, RSV PCR were all negative.   Per my interpretation, EKG in ED demonstrated the following:, Relative to most recent prior EKG from 01/25/2023 shows sinus tachycardia with heart rate 108, normal intervals, nonspecific T wave inversion in lead III, which appears unchanged from most recent prior EKG, demonstrates no evidence of ST changes, including no evidence of ST elevation.   Imaging and additional notable ED work-up: CT head, performed radiology read, shows no evidence of acute intracranial process, including no evidence of intracranial hemorrhage or any evidence of acute infarct.  CTA chest, relative to CT chest from 12/02/2022, per formal radiology read, shows acute pulmonary embolism at the distal right main pulmonary artery extending into the right middle and lower pulmonary arteries, with CT evidence of right heart strain.  Additionally, CTA chest shows pulmonary parenchymal findings suggestive of multifocal pneumonia superimposed on a background of interstitial lung disease as well as small bilateral pleural effusions.  CTA chest also shows unchanged aneurysmal dilation of the ascending, transverse, and descending thoracic aorta, without any corresponding evidence of dissection.   EDP discussed the patient's case with the on-call infectious disease physician, Dr. Zenaida Niece dam, he felt that acute pneumonia was slightly less likely given the patient's broad-spectrum IV antibiotics over the last several weeks, including daptomycin, Rocephin, and Bactrim.  He did not recommend changing daptomycin to IV vancomycin, but rather recommended continuation of daptomycin.  He  did however recommend consideration for escalation of Rocephin to cefepime, with no additional changes recommended per infectious disease at this time.   While in the ED, the following were administered: Heparin bolus followed by initiation heparin drip.  Hydrocortisone 100 mg IV x 1 dose,  cefepime, daptomycin, normal symptoms 1.5 L bolus.   Subsequently, the patient was admitted for further evaluation management of acute pulmonary embolism, complicated by acute hypoxic respiratory distress presenting with generalized weakness, with presentation also notable for potential HCAP pneumonia, and with presenting labs also notable for acute hyponatremia.      Hospital Course:  #1 acute hypoxic respiratory failure in the setting of multifocal pneumonia versus eosinophilic pneumonia from daptomycin versus interstitial lung disease/autoimmune process/acute PE with right lower extremity DVT in the setting of PMR on chronic steroids/immunocompromise state -Patient was seen in consultation by ID, daptomycin discontinued on 03/05/2023 due to concerns for possible eosinophilic pneumonia. -Patient with improvement currently on 1 L nasal cannula with sats of 97% at rest. -Patient noted to have been placed on high dose steroids which are being tapered down patient currently on prednisone 40 mg daily recommended to continue for the next 3 to 4 weeks at this dose until seen by PCCM in the outpatient setting. -ID recommended continuation of IV Rocephin, oral doxycycline, Bactrim for PJP prophylaxis. -Patient completed course of antibiotics 03/23/2023. -ID recommending continuation of Bactrim for pneumocystis prophylaxis while on high-dose steroids. -Will need outpatient follow-up with ID at [facility] postdischarge.   2.  Paroxysmal atrial fibrillation/atrial flutter -Seen in consultation by cardiology and felt likely secondary to acute pulmonary issues. -Noted to have converted on IV amiodarone which was continued for 24 hours and subsequently discontinued. -Patient subsequently maintained on low-dose Lopressor for rate control, Eliquis for anticoagulation.    3.  PE/right lower extremity DVT -Noted on CT angiogram chest. -Patient noted to have elevated troponins were likely secondary to demand  ischemia. -Lower extremity Dopplers done positive for right lower extremity DVT. -2D echo done with abnormal septal motion, EF 55 to 60%,NWMA, no evidence of cor pulmonale in the setting of recent PE. -Patient initially placed on IV heparin and subsequently transition to Eliquis.   -Patient improved clinically.   -Patient will be discharged on Eliquis.   -Outpatient follow-up with pulmonary and PCP.    4.  AAA status post graft and possible aortitis -Patient noted to have been on daptomycin per ID at [facility] which was discontinued during this hospitalization due to concerns for eosinophilic pneumonia. -Patient received a full course of IV Rocephin and IV doxycycline which was completed during the hospitalization on day of discharge.  -Patient also maintained on Bactrim for PJP prophylaxis per ID recommendations while on steroids.  Patient will be discharged on Bactrim for PJP prophylaxis. -Outpatient follow[facility] with ID at [facility].   5.  Acute delirium/anxiety -Possibly secondary to hospital delirium versus from hypoxia versus steroid-induced. -Improved clinically.     6.  Hyperlipidemia -Patient started on a statin however family does not want statins due to intolerance and as such statin was discontinued.    6.  PMR -Patient noted to chronically be on 5 mg of prednisone at home was on high-dose steroids and tapered down to 40 mg of prednisone daily secondary to problem #1 by day of discharge.  Patient be discharged on prednisone 40 mg daily until follow-up with pulmonary.    7.  BPH -Remained stable during the hospitalization.   -Home regimen Flomax will be resumed on discharge.  8.  Anemia of chronic disease -H&H stable. -No overt bleeding.   9. chronic leukocytosis -Patient noted to have a chronic leukocytosis could be secondary to steroid induced. -Labs with some improvement with leukocytosis by day of discharge.   -Patient completed course of antibiotics during the hospitalization,  remained afebrile.   -Outpatient follow up with hematology for follow-up on leukocytosis.   -Ambulatory referral placed. -Patient will be discharged to a skilled nursing facility.   10.  GERD -Patient maintained on PPI, Pepcid, Zofran.     11.  Nausea with doxycycline -Doxycycline was tolerable with IV antiemetics.  Patient finished course of antibiotics during this hospitalization no further antibiotics needed.     12.  Oral thrush -Nystatin suspension ordered however per RN patient and family refused due to concerns it might be a statin. -Status post dose of fluconazole. -Clinical improvement. -Outpatient follow-up with MD at SNF.   13.  Pressure injury, not present on admission Pressure Injury 03/12/23 Sacrum Mid Stage 1 -  Intact skin with non-blanchable redness of a localized area usually over a bony prominence. Pink non blanching on sacrum (Active)  03/12/23 0800  Location: Sacrum  Location Orientation: Mid  Staging: Stage 1 -  Intact skin with non-blanchable redness of a localized area usually over a bony prominence.  Wound Description (Comments): Pink non blanching on sacrum  Present on Admission: No       Procedures: CT angiogram chest 03/05/2023 CT head without contrast 03/05/2023 Chest x-ray 03/05/2023, 03/07/2023, 03/09/2023, 03/10/2023, 03/12/2023, 03/20/2023 2D echo 03/06/2023 Lower extremity Dopplers 03/07/2023 Upper extremity Dopplers 03/07/2023  Consultations: Cardiology: Dr. Eden Emms 03/06/2023 PCCM: Dr. Francine Graven 03/06/2023 ID: Dr.Van Dam 03/06/2023 Palliative care: Dr. Patterson Hammersmith 03/13/2023  Discharge Exam: Vitals:   03/23/23 0900 03/23/23 1148  BP:  (!) 99/54  Pulse: 85 88  Resp: 16 16  Temp:    SpO2: 97% 93%    General: NAD Cardiovascular: RRR no murmurs rubs or gallops.  No JVD.  No lower extremity edema. Respiratory: Clear to auscultation bilaterally anterior lung fields.  Discharge Instructions   Discharge Instructions     Ambulatory referral to Hematology / Oncology    Complete by: As directed    Diet general   Complete by: As directed    Discharge wound care:   Complete by: As directed    Pressure Injury 03/12/23 Sacrum Mid Stage 1 -  Intact skin with non-blanchable redness of a localized area usually over a bony prominence. Pink non blanching on sacrum   Discharge wound care:   Complete by: As directed    Pressure Injury 03/12/23 Sacrum Mid Stage 1 -  Intact skin with non-blanchable redness of a localized area usually over a bony prominence. Pink non blanching on sacrum   Increase activity slowly   Complete by: As directed       Allergies as of 03/23/2023       Reactions   Rosuvastatin Other (See Comments)   Stopped taking due to feeling achy     Codeine Nausea And Vomiting   Morphine And Related Nausea And Vomiting        Medication List     STOP taking these medications    acyclovir 400 MG tablet Commonly known as: ZOVIRAX   cefTRIAXone  IVPB Commonly known as: ROCEPHIN   daptomycin  IVPB Commonly known as: CUBICIN   rosuvastatin 5 MG tablet Commonly known as: CRESTOR       TAKE these medications    acetaminophen 325 MG  tablet Commonly known as: TYLENOL Take 2 tablets (650 mg total) by mouth every 6 (six) hours as needed for mild pain (or Fever >/= 101).   apixaban 5 MG Tabs tablet Commonly known as: ELIQUIS Take 1 tablet (5 mg total) by mouth 2 (two) times daily.   aspirin 81 MG chewable tablet Chew 1 tablet (81 mg total) by mouth daily. Start taking on: March 24, 2023   butalbital-acetaminophen-caffeine 50-325-40 MG tablet Commonly known as: FIORICET Take 1 tablet by mouth daily as needed for headache.   cholecalciferol 25 MCG (1000 UNIT) tablet Commonly known as: VITAMIN D3 Take 1,000 Units by mouth daily.   diclofenac Sodium 1 % Gel Commonly known as: VOLTAREN Apply 2 g topically 4 (four) times daily as needed (shoulder pain).   dicyclomine 10 MG/5ML solution Commonly known as: BENTYL Take 5 mLs (10  mg total) by mouth 3 (three) times daily as needed (reflux or epigastric pain).   DOCOSAHEXAENOIC ACID PO Take 1 g by mouth daily.   famotidine 20 MG tablet Commonly known as: PEPCID Take 1 tablet (20 mg total) by mouth daily. Start taking on: March 24, 2023   feeding supplement Liqd Take 237 mLs by mouth 3 (three) times daily between meals.   fish oil-omega-3 fatty acids 1000 MG capsule Take 1,000 mg by mouth daily.   fluorometholone 0.1 % ophthalmic suspension Commonly known as: FML Place 1 drop into the right eye at bedtime.   folic acid 1 MG tablet Commonly known as: FOLVITE Take 1 mg by mouth daily.   hydrOXYzine 50 MG tablet Commonly known as: ATARAX Take 1 tablet (50 mg total) by mouth every 6 (six) hours as needed for anxiety.   melatonin 3 MG Tabs tablet Take 1 tablet (3 mg total) by mouth at bedtime.   Metafolbic 04-28-49-5 MG Tabs TAKE ONE TABLET TWICE DAILY   metoprolol tartrate 25 MG tablet Commonly known as: LOPRESSOR Take 0.5 tablets (12.5 mg total) by mouth 2 (two) times daily.   MULTIVITAMIN PO Take 1 tablet by mouth daily.   pantoprazole 40 MG tablet Commonly known as: PROTONIX Take 1 tablet (40 mg total) by mouth 2 (two) times daily for 30 days, THEN 1 tablet (40 mg total) daily. Start taking on: March 23, 2023 What changed: See the new instructions.   predniSONE 20 MG tablet Commonly known as: DELTASONE Take 2 tablets (40 mg total) by mouth daily with breakfast. Start taking on: March 24, 2023 What changed:  medication strength how much to take   QUEtiapine 50 MG tablet Commonly known as: SEROQUEL Take 1 tablet (50 mg total) by mouth at bedtime.   QUEtiapine 25 MG tablet Commonly known as: SEROQUEL Take 0.5 tablets (12.5 mg total) by mouth daily. Start taking on: March 24, 2023   sulfamethoxazole-trimethoprim 800-160 MG tablet Commonly known as: BACTRIM DS Take 1 tablet by mouth 3 (three) times a week. Monday, Wednesday,  Friday Start taking on: March 24, 2023   tamsulosin 0.4 MG Caps capsule Commonly known as: FLOMAX Take 0.8 mg by mouth at bedtime.   Tart Cherry 1200 MG Caps Take 1 tablet by mouth daily.   traZODone 50 MG tablet Commonly known as: DESYREL Take 25 mg by mouth at bedtime as needed for sleep.               Durable Medical Equipment  (From admission, onward)           Start     Ordered  03/23/23 0829  For home use only DME oxygen  Once       Question Answer Comment  Length of Need 6 Months   Mode or (Route) Nasal cannula   Liters per Minute 2   Frequency Continuous (stationary and portable oxygen unit needed)   Oxygen conserving device Yes   Oxygen delivery system Gas      03/23/23 0829              Discharge Care Instructions  (From admission, onward)           Start     Ordered   03/23/23 0000  Discharge wound care:       Comments: Pressure Injury 03/12/23 Sacrum Mid Stage 1 -  Intact skin with non-blanchable redness of a localized area usually over a bony prominence. Pink non blanching on sacrum   03/23/23 1131   03/23/23 0000  Discharge wound care:       Comments: Pressure Injury 03/12/23 Sacrum Mid Stage 1 -  Intact skin with non-blanchable redness of a localized area usually over a bony prominence. Pink non blanching on sacrum   03/23/23 1208           Allergies  Allergen Reactions   Rosuvastatin Other (See Comments)    Stopped taking due to feeling achy     Codeine Nausea And Vomiting   Morphine And Related Nausea And Vomiting    Follow-up Information     MD AT SNF Follow up.          ID at [facility]. Schedule an appointment as soon as possible for a visit in 2 week(s).          Martina Sinner, MD Follow up on 04/20/2023.   Specialty: Pulmonary Disease Why: f/u at 11am Contact information: [address] [place] [address] [phone]                  The results of significant  diagnostics from this hospitalization (including imaging, microbiology, ancillary and laboratory) are listed below for reference.    Significant Diagnostic Studies: DG CHEST PORT 1 VIEW  Result Date: 03/20/2023 CLINICAL DATA:  89 year old male with respiratory failure. Diagnosed with right lung PE or earlier this month. EXAM: PORTABLE CHEST 1 VIEW COMPARISON:  Portable chest 03/12/2023 and earlier. FINDINGS: Portable AP semi upright view at 0633 hours. Stable right PICC line. Stable somewhat low lung volumes. Stable cardiac size and mediastinal contours. Widespread abnormal course and confluent pulmonary opacity in both lungs, although peripheral right lung ventilation has improved. Left lung ventilation not significantly changed. Most of this opacity is new from February radiographs. No pneumothorax. No definite pleural effusion. Stable visualized osseous structures.  Negative visible bowel gas. IMPRESSION: 1. Widespread bilateral pulmonary opacity with some improved right lung ventilation since 03/12/2023. Left lung not significantly changed. This is nonspecific but might be sequelae of ARDS following pulmonary emboli. 2. Stable right PICC line. No new cardiopulmonary abnormality. Electronically Signed   By: Odessa Fleming M.D.   On: 03/20/2023 08:44   DG CHEST PORT 1 VIEW  Result Date: 03/12/2023 CLINICAL DATA:  Acute hypercapnic respiratory failure. EXAM: PORTABLE CHEST 1 VIEW COMPARISON:  Chest x-ray dated 03/10/2023. FINDINGS: Diffuse opacities bilaterally, predominantly interstitial, not significantly changed compared to most recent chest x-ray of 03/10/2023. No pleural effusion or pneumothorax is seen. Heart size and mediastinal contours appear stable. RIGHT-sided PICC line is well positioned with tip at the level of the RIGHT  atrium. IMPRESSION: Diffuse opacities bilaterally, predominantly interstitial, not significantly changed compared to most recent chest x-ray of 03/10/2023, compatible with multifocal  pneumonia and/or pulmonary edema. Electronically Signed   By: Bary Richard M.D.   On: 03/12/2023 12:04   DG Chest Port 1 View  Result Date: 03/10/2023 CLINICAL DATA:  Respiratory failure, ARDS and pulmonary embolism. EXAM: PORTABLE CHEST 1 VIEW COMPARISON:  03/09/2023 FINDINGS: Stable heart size and aortic prominence. Increased peripheral pulmonary consolidative opacity in both upper lobes. Stable additional interstitial infiltrates in both lungs. No visualized pleural fluid or pneumothorax. IMPRESSION: Increased peripheral pulmonary consolidative opacity in both upper lobes. Stable additional interstitial infiltrates in both lungs. Stable heart size and aortic prominence. Electronically Signed   By: Irish Lack M.D.   On: 03/10/2023 11:21   DG CHEST PORT 1 VIEW  Result Date: 03/09/2023 CLINICAL DATA:  Respiratory failure EXAM: PORTABLE CHEST 1 VIEW COMPARISON:  Chest radiograph 03/07/2023 FINDINGS: Right arm PICC terminates at the cavoatrial junction. No pleural effusion. No pneumothorax. Redemonstrated are extensive bilateral interstitial opacities with a more focal airspace opacity at the left lung base, stable to slightly improved from 03/07/2023. Unchanged cardiac and mediastinal contours. No radiographically apparent displaced rib fractures. Visualized upper abdomen is unremarkable. IMPRESSION: Redemonstrated diffuse bilateral interstitial opacities with slight interval improvement in superimposed bilateral patchy airspace opacities. Electronically Signed   By: Lorenza Cambridge M.D.   On: 03/09/2023 10:08   VAS [place] UPPER EXTREMITY VENOUS DUPLEX  Result Date: 03/07/2023 UPPER VENOUS STUDY  Patient Name:  BERISH BOHMAN [facility]  Date of Exam:   03/07/2023 Medical Rec #: [id number]       Accession #:    [id number] Date of Birth: [birth year]      Patient Gender: M Patient Age:   89 years Exam Location:  [facility] Procedure:      VAS [place] UPPER EXTREMITY VENOUS DUPLEX Referring Phys: CORNELIUS VAN DAM  --------------------------------------------------------------------------------  Indications: pulmonary embolism Anticoagulation: Heparin. Limitations: IV placement, bandages. Comparison Study: No prior study. Performing Technologist: McKayla Maag RVT, [place]  Examination Guidelines: A complete evaluation includes B-mode imaging, spectral Doppler, color Doppler, and power Doppler as needed of all accessible portions of each vessel. Bilateral testing is considered an integral part of a complete examination. Limited examinations for reoccurring indications may be performed as noted.  Right Findings: +----------+------------+---------+-----------+----------+-------+ RIGHT     CompressiblePhasicitySpontaneousPropertiesSummary +----------+------------+---------+-----------+----------+-------+ IJV           Full       Yes       Yes                      +----------+------------+---------+-----------+----------+-------+ Subclavian    Full       Yes       Yes                      +----------+------------+---------+-----------+----------+-------+ Axillary      Full       Yes       Yes                      +----------+------------+---------+-----------+----------+-------+ Brachial      Full       Yes       Yes                      +----------+------------+---------+-----------+----------+-------+ Radial        Full                                          +----------+------------+---------+-----------+----------+-------+  Ulnar         Full                                          +----------+------------+---------+-----------+----------+-------+ Cephalic      Full                                          +----------+------------+---------+-----------+----------+-------+ Basilic       Full                                          +----------+------------+---------+-----------+----------+-------+  Left Findings:  +----------+------------+---------+-----------+----------+-------+ LEFT      CompressiblePhasicitySpontaneousPropertiesSummary +----------+------------+---------+-----------+----------+-------+ IJV           Full       Yes       Yes                      +----------+------------+---------+-----------+----------+-------+ Subclavian    Full       Yes       Yes                      +----------+------------+---------+-----------+----------+-------+ Axillary      Full       Yes       Yes                      +----------+------------+---------+-----------+----------+-------+ Brachial      Full       Yes       Yes                      +----------+------------+---------+-----------+----------+-------+ Radial        Full                                          +----------+------------+---------+-----------+----------+-------+ Ulnar         Full                                          +----------+------------+---------+-----------+----------+-------+ Cephalic      Full                                          +----------+------------+---------+-----------+----------+-------+ Basilic       Full                                          +----------+------------+---------+-----------+----------+-------+  Summary: No evidence of deep vein or superficial vein thrombosis involving the right and left upper extremities. Right: However, unable to visualize segments of the brachial veins due to picc line and bandage.  Left: However, unable to visualize segments of the radial and ulnar veins due to IV placement.  *See table(s) above for measurements and observations.  Diagnosing physician: Waverly Ferrari MD Electronically signed by Waverly Ferrari MD on 03/07/2023 at 6:17:24 PM.    Final    VAS [place] LOWER EXTREMITY VENOUS (DVT)  Result Date: 03/07/2023  Lower Venous DVT Study Patient Name:  DENARIUS SESLER [facility]  Date of Exam:   03/07/2023 Medical Rec #: [id number]        Accession #:    [id number] Date of Birth: [birth year]      Patient Gender: M Patient Age:   61 years Exam Location:  [facility] Procedure:      VAS [place] LOWER EXTREMITY VENOUS (DVT) Referring Phys: CORNELIUS VAN DAM --------------------------------------------------------------------------------  Indications: Pulmonary embolism.  Anticoagulation: Heparin. Comparison Study: No prior study. Performing Technologist: McKayla Maag RVT, VT  Examination Guidelines: A complete evaluation includes B-mode imaging, spectral Doppler, color Doppler, and power Doppler as needed of all accessible portions of each vessel. Bilateral testing is considered an integral part of a complete examination. Limited examinations for reoccurring indications may be performed as noted. The reflux portion of the exam is performed with the patient in reverse Trendelenburg.  +---------+---------------+---------+-----------+----------+--------------+ RIGHT    CompressibilityPhasicitySpontaneityPropertiesThrombus Aging +---------+---------------+---------+-----------+----------+--------------+ CFV      Full           Yes      Yes                                 +---------+---------------+---------+-----------+----------+--------------+ SFJ      Full                                                        +---------+---------------+---------+-----------+----------+--------------+ FV Prox  Full                                                        +---------+---------------+---------+-----------+----------+--------------+ FV Mid   Full                                                        +---------+---------------+---------+-----------+----------+--------------+ FV DistalFull                                                        +---------+---------------+---------+-----------+----------+--------------+ PFV      Full                                                         +---------+---------------+---------+-----------+----------+--------------+ POP      Full           Yes      Yes                                 +---------+---------------+---------+-----------+----------+--------------+  PTV      Full                                                        +---------+---------------+---------+-----------+----------+--------------+ PERO     Partial        Yes      Yes                  Acute          +---------+---------------+---------+-----------+----------+--------------+   +---------+---------------+---------+-----------+----------+--------------+ LEFT     CompressibilityPhasicitySpontaneityPropertiesThrombus Aging +---------+---------------+---------+-----------+----------+--------------+ CFV      Full           Yes      Yes                                 +---------+---------------+---------+-----------+----------+--------------+ SFJ      Full                                                        +---------+---------------+---------+-----------+----------+--------------+ FV Prox  Full                                                        +---------+---------------+---------+-----------+----------+--------------+ FV Mid   Full                                                        +---------+---------------+---------+-----------+----------+--------------+ FV DistalFull                                                        +---------+---------------+---------+-----------+----------+--------------+ PFV      Full                                                        +---------+---------------+---------+-----------+----------+--------------+ POP      Full           Yes      Yes                                 +---------+---------------+---------+-----------+----------+--------------+ PTV      Full                                                         +---------+---------------+---------+-----------+----------+--------------+  PERO     Full                                                        +---------+---------------+---------+-----------+----------+--------------+     Summary: RIGHT: - Findings consistent with acute deep vein thrombosis involving the right peroneal veins. - No cystic structure found in the popliteal fossa.  LEFT: - There is no evidence of deep vein thrombosis in the lower extremity.  - No cystic structure found in the popliteal fossa.  *See table(s) above for measurements and observations. Electronically signed by Waverly Ferrari MD on 03/07/2023 at 6:16:33 PM.    Final    DG CHEST PORT 1 VIEW  Result Date: 03/07/2023 CLINICAL DATA:  Dyspnea. EXAM: PORTABLE CHEST 1 VIEW COMPARISON:  03/05/2023 FINDINGS: The cardio pericardial silhouette is enlarged. Diffuse interstitial opacity again noted with progression of peripheral patchy areas of airspace opacity bilaterally. No pleural effusion. Right PICC line tip overlies the SVC/RA junction. Telemetry leads overlie the chest. Peripheral IMPRESSION: Diffuse interstitial opacity with progression of peripheral patchy areas of airspace opacity bilaterally suggesting multifocal pneumonia. Electronically Signed   By: Kennith Center M.D.   On: 03/07/2023 08:16   ECHOCARDIOGRAM COMPLETE  Result Date: 03/06/2023    ECHOCARDIOGRAM REPORT   Patient Name:   KAIO KUHLMAN [facility] Date of Exam: 03/06/2023 Medical Rec #:  [id number]      Height:       72.0 in Accession #:    [id number]     Weight:       170.0 lb Date of Birth:  02-07-1930     BSA:          1.988 m Patient Age:    89 years       BP:           140/80 mmHg Patient Gender: M              HR:           91 bpm. Exam Location:  Inpatient Procedure: 2D Echo, Cardiac Doppler and Color Doppler Indications:    I26.09 Pulmonary embolus  History:        Patient has prior history of Echocardiogram examinations, most                 recent 06/08/2022. CHF,  AAA; Risk Factors:Dyslipidemia,                 Hypertension and Non-Smoker.  Sonographer:    Dondra Prader RVT RCS Referring Phys: [id number] Angie Fava  Sonographer Comments: Technically challenging study due to limited acoustic windows, Technically difficult study due to poor echo windows, suboptimal parasternal window and suboptimal apical window. Image acquisition challenging due to uncooperative patient. Patient could not tolerate exam; Patient would not lie still. IMPRESSIONS  1. Abnormal septal motion . Left ventricular ejection fraction, by estimation, is 55 to 60%. The left ventricle has normal function. The left ventricle has no regional wall motion abnormalities. Left ventricular diastolic parameters were normal.  2. No evidence of cor pulmonale in setting of recent PE . Right ventricular systolic function is normal. The right ventricular size is normal.  3. The mitral valve is normal in structure. No evidence of mitral valve regurgitation. No evidence of mitral stenosis.  4. Mean gradient across valve is 10 mmHg  peak 16.8 mmHg but AVA 2.1 cm2 Similar AV sclerosis on TTE done at [facility] Some shadowing artifact in LVOT Did not think any vegetatiions /SBE present has had recent negative tagged WBC scan at [facility] as well with negative BC;s . The aortic valve is tricuspid. There is moderate calcification of the aortic valve. There is moderate thickening of the aortic valve. Aortic valve regurgitation is not visualized. Aortic valve sclerosis/calcification is present, without any evidence of aortic stenosis.  5. The inferior vena cava is normal in size with greater than 50% respiratory variability, suggesting right atrial pressure of 3 mmHg. FINDINGS  Left Ventricle: Abnormal septal motion. Left ventricular ejection fraction, by estimation, is 55 to 60%. The left ventricle has normal function. The left ventricle has no regional wall motion abnormalities. The left ventricular internal cavity size was normal in  size. There is no left ventricular hypertrophy. Left ventricular diastolic parameters were normal. Right Ventricle: No evidence of cor pulmonale in setting of recent PE. The right ventricular size is normal. No increase in right ventricular wall thickness. Right ventricular systolic function is normal. Left Atrium: Left atrial size was normal in size. Right Atrium: Right atrial size was normal in size. Pericardium: There is no evidence of pericardial effusion. Mitral Valve: The mitral valve is normal in structure. No evidence of mitral valve regurgitation. No evidence of mitral valve stenosis. Tricuspid Valve: The tricuspid valve is normal in structure. Tricuspid valve regurgitation is trivial. No evidence of tricuspid stenosis. Aortic Valve: Mean gradient across valve is 10 mmHg peak 16.8 mmHg but AVA 2.1 cm2 Similar AV sclerosis on TTE done at [facility] Some shadowing artifact in LVOT Did not think any vegetatiions /SBE present has had recent negative tagged WBC scan at [facility] as well with negative BC;s. The aortic valve is tricuspid. There is moderate calcification of the aortic valve. There is moderate thickening of the aortic valve. Aortic valve regurgitation is not visualized. Aortic valve sclerosis/calcification is present, without any evidence of aortic stenosis. Aortic valve mean gradient measures 10.0 mmHg. Aortic valve peak gradient measures 16.8 mmHg. Aortic valve area, by VTI measures 2.25 cm. Pulmonic Valve: The pulmonic valve was normal in structure. Pulmonic valve regurgitation is not visualized. No evidence of pulmonic stenosis. Aorta: The aortic root is normal in size and structure. Venous: The inferior vena cava is normal in size with greater than 50% respiratory variability, suggesting right atrial pressure of 3 mmHg. IAS/Shunts: No atrial level shunt detected by color flow Doppler.  LEFT VENTRICLE PLAX 2D LVIDd:         3.90 cm   Diastology LVIDs:         2.60 cm   LV e' medial:    6.45 cm/s LV PW:          1.30 cm   LV E/e' medial:  12.6 LV IVS:        0.80 cm   LV e' lateral:   8.03 cm/s LVOT diam:     2.10 cm   LV E/e' lateral: 10.1 LV SV:         79 LV SV Index:   40 LVOT Area:     3.46 cm  RIGHT VENTRICLE             IVC RV Basal diam:  3.90 cm     IVC diam: 2.20 cm RV S prime:     11.30 cm/s TAPSE (M-mode): 2.3 cm LEFT ATRIUM  Index        RIGHT ATRIUM           Index LA diam:      2.90 cm 1.46 cm/m   RA Area:     13.00 cm LA Vol (A2C): 35.6 ml 17.90 ml/m  RA Volume:   29.60 ml  14.89 ml/m LA Vol (A4C): 26.3 ml 13.23 ml/m  AORTIC VALVE                     PULMONIC VALVE AV Area (Vmax):    2.21 cm      PV Vmax:       0.71 m/s AV Area (Vmean):   2.14 cm      PV Peak grad:  2.0 mmHg AV Area (VTI):     2.25 cm AV Vmax:           205.00 cm/s AV Vmean:          151.000 cm/s AV VTI:            0.349 m AV Peak Grad:      16.8 mmHg AV Mean Grad:      10.0 mmHg LVOT Vmax:         131.00 cm/s LVOT Vmean:        93.300 cm/s LVOT VTI:          0.227 m LVOT/AV VTI ratio: 0.65  AORTA Ao Root diam: 3.60 cm Ao Asc diam:  3.50 cm Ao Arch diam: 3.4 cm MITRAL VALVE               TRICUSPID VALVE MV Area (PHT): 3.24 cm    TR Peak grad:   31.6 mmHg MV Decel Time: 234 msec    TR Vmax:        281.00 cm/s MV E velocity: 81.50 cm/s MV A velocity: 82.60 cm/s  SHUNTS MV E/A ratio:  0.99        Systemic VTI:  0.23 m                            Systemic Diam: 2.10 cm Charlton Haws MD Electronically signed by Charlton Haws MD Signature Date/Time: 03/06/2023/4:39:06 PM    Final    CT Angio Chest PE W and/or Wo Contrast  Result Date: 03/05/2023 CLINICAL DATA:  Generalized weakness with urinary frequency. Patient on antibiotic for unknown organism via PICC line. EXAM: CT ANGIOGRAPHY CHEST WITH CONTRAST TECHNIQUE: Multidetector CT imaging of the chest was performed using the standard protocol during bolus administration of intravenous contrast. Multiplanar CT image reconstructions and MIPs were obtained to evaluate the  vascular anatomy. RADIATION DOSE REDUCTION: This exam was performed according to the departmental dose-optimization program which includes automated exposure control, adjustment of the mA and/or kV according to patient size and/or use of iterative reconstruction technique. CONTRAST:  75mL OMNIPAQUE IOHEXOL 350 MG/ML SOLN COMPARISON:  Chest radiographs earlier today and CTA chest 12/02/2022 FINDINGS: Cardiovascular: Satisfactory opacification of the pulmonary arteries to the segmental level. Acute pulmonary embolism arising at the distal right main pulmonary artery and extending into the lobar, segmental and subsegmental branches of the right middle and lower lobe pulmonary arteries. Diffuse aneurysmal dilation of the ascending, transverse, and descending thoracic aorta. The mid ascending aorta measures 44 mm in maximum diameter. The proximal descending thoracic aorta measures 51 mm. These are not significantly changed from 12/02/2022. Calcified atherosclerotic plaque. Prominent PICC tip in the low SVC.  Mild cardiomegaly. Coronary artery calcification. Mediastinum/Nodes: Prominent mediastinal and hilar nodes measuring up to 1.2 cm in the left hilum (5/73 are favored reactive. Unremarkable esophagus. Lungs/Pleura: Bilateral patchy ground-glass and consolidative opacities with interlobular septal thickening suggestive of multifocal pneumonia. This is superimposed on a background of peripheral subpleural reticulation and bronchiolectasis compatible with interstitial lung disease. Small bilateral pleural effusions. No pneumothorax. Upper Abdomen: No acute abnormality. Musculoskeletal: No chest wall abnormality. No acute osseous findings. Review of the MIP images confirms the above findings. IMPRESSION: 1. Acute pulmonary embolism arising at the distal right main pulmonary artery and extending into right middle and lower pulmonary arteries. CT evidence of right heart strain (RV/LV Ratio = 1.14) consistent with at least  submassive (intermediate risk) PE. The presence of right heart strain has been associated with an increased risk of morbidity and mortality. Please refer to the "Code PE Focused" order set in EPIC. 2. Pulmonary parenchymal findings compatible with multifocal pneumonia superimposed on a background of interstitial lung disease. 3.  Small bilateral pleural effusions. 4. Unchanged aneurysmal dilation of the ascending, transverse, and descending thoracic aorta with maximum diameter of 51 mm in the proximal descending aorta. Recommend semi-annual imaging followup by CTA or MRA and referral to cardiothoracic surgery if not already obtained. This recommendation follows 2010 ACCF/AHA/AATS/ACR/ASA/SCA/SCAI/SIR/STS/SVM Guidelines for the Diagnosis and Management of Patients With Thoracic Aortic Disease. Circulation. 2010; 121: O962-X52. Aortic aneurysm NOS (ICD10-I71.9) These results were called by telephone at the time of interpretation on 03/05/2023 at 7:58 pm to provider [facility] , who verbally acknowledged these results. Electronically Signed   By: Minerva Fester M.D.   On: 03/05/2023 19:58   CT Head Wo Contrast  Result Date: 03/05/2023 CLINICAL DATA:  Mental status change EXAM: CT HEAD WITHOUT CONTRAST TECHNIQUE: Contiguous axial images were obtained from the base of the skull through the vertex without intravenous contrast. RADIATION DOSE REDUCTION: This exam was performed according to the departmental dose-optimization program which includes automated exposure control, adjustment of the mA and/or kV according to patient size and/or use of iterative reconstruction technique. COMPARISON:  Head CT 12/30/2021 FINDINGS: Brain: No evidence of acute infarction, hemorrhage, hydrocephalus, extra-axial collection or mass lesion/mass effect. There is stable mild periventricular white matter hypodensity, likely chronic small vessel ischemic change. There is stable mild diffuse atrophy. Vascular: Atherosclerotic  calcifications are present within the cavernous internal carotid arteries. Skull: Normal. Negative for fracture or focal lesion. Sinuses/Orbits: No acute finding. Other: None. IMPRESSION: 1. No acute intracranial abnormality. 2. Stable mild chronic small vessel ischemic changes. 3. Stable mild diffuse atrophy. Electronically Signed   By: Darliss Cheney M.D.   On: 03/05/2023 19:39   DG Chest Port 1 View  Result Date: 03/05/2023 CLINICAL DATA:  sob EXAM: PORTABLE CHEST 1 VIEW COMPARISON:  January 24, 2023 FINDINGS: The cardiomediastinal silhouette is grossly unchanged and enlarged in contour.RIGHT upper extremity PICC tip terminates over the superior RIGHT atrium. No pleural effusion. No pneumothorax. Increased lateral opacities within the LEFT lung. Persistent background of coarse reticulation, similar in comparison to priors. IMPRESSION: Increased opacities within the lateral LEFT lung. Differential considerations include infection, infarction atelectasis. Electronically Signed   By: Meda Klinefelter M.D.   On: 03/05/2023 17:27    Microbiology: No results found for this or any previous visit (from the past 240 hour(s)).   Labs: Basic Metabolic Panel: Recent Labs  Lab 03/19/23 1130 03/22/23 1000 03/23/23 0315  NA 139 137 135  K 4.2 3.7 4.1  CL 101 100 98  CO2 GLUCOSE 161* 98 95  BUN 34* 29* 29*  CREATININE 0.80 0.80 0.87  CALCIUM 8.5* 8.4* 8.3*  MG  --  1.9  --    Liver Function Tests: No results for input(s): "AST", "ALT", "ALKPHOS", "BILITOT", "PROT", "ALBUMIN" in the last 168 hours. No results for input(s): "LIPASE", "AMYLASE" in the last 168 hours. No results for input(s): "AMMONIA" in the last 168 hours. CBC: Recent Labs  Lab 03/19/23 1130 03/20/23 1210 03/22/23 1000 03/23/23 0315  WBC 33.2* 30.0* 22.9* 22.3*  NEUTROABS 31.9* 28.8* 19.7* 20.1*  HGB 10.1* 10.7* 10.2* 9.8*  HCT 31.3* 32.4* 31.5* 28.8*  MCV 96.9 95.0 94.9 93.2  PLT 440* 445* 393 338   Cardiac  Enzymes: No results for input(s): "CKTOTAL", "CKMB", "CKMBINDEX", "TROPONINI" in the last 168 hours. BNP: BNP (last 3 results) Recent Labs    01/25/23 0345 03/05/23 1614  BNP 91.7 121.7*    ProBNP (last 3 results) No results for input(s): "PROBNP" in the last 8760 hours.  CBG: Recent Labs  Lab 03/22/23 1115 03/22/23 1639 03/22/23 2104 03/23/23 0556 03/23/23 1146  GLUCAP 97 184* 128* 93 150*       Signed:  Ramiro Harvest MD.  [facility] 03/23/2023, 12:26 PM

## 2023-03-23 NOTE — TOC Transition Note (Signed)
Transition of Care [facility]) - CM/SW Discharge Note   Patient Details  Name: [name] MRN: [id number] Date of Birth: 07/03/1930  [facility] CM/SW Contact:  Leander Rams, LCSW Phone Number: 03/23/2023, 1:00 PM   Clinical Narrative:    Patient will DC to: [facility] Anticipated DC date: 03/23/2023 Family notified: Darl Pikes Transport by: Sharin Mons    Per MD patient ready for DC to [facility]. RN, patient, patient's family, and facility notified of DC. Discharge Summary and FL2 sent to facility. RN to call report prior to discharge [phone]. DC packet on chart. Ambulance transport requested for patient.   CSW will sign off for now as social work intervention is no longer needed. Please consult [place] again if new needs arise.    Final next level of care: Skilled Nursing Facility Barriers to Discharge: No Barriers Identified   Patient Goals and CMS Choice CMS Medicare.gov Compare Post Acute Care list provided to::  Langley Gauss) Choice offered to / list presented to : Spouse  Discharge Placement                Patient chooses bed at: [facility] Patient to be transferred to facility by: [facility] Name of family member notified: Darl Pikes Patient and family notified of of transfer: 03/23/23  Discharge Plan and Services Additional resources added to the After Visit Summary for     Discharge Planning Services: CM Consult Post Acute Care Choice: Home Health                               Social Determinants of Health (SDOH) Interventions SDOH Screenings   Food Insecurity: No Food Insecurity (03/15/2023)  Housing: Low Risk  (03/15/2023)  Transportation Needs: No Transportation Needs (03/15/2023)  Utilities: Not At Risk (03/15/2023)  Financial Resource Strain: Low Risk  (03/07/2023)  Tobacco Use: Low Risk  (03/05/2023)     Readmission Risk Interventions     No data to display          Oletta Lamas, MSW, LCSWA, LCASA Transitions of Care   Clinical Social Worker I

## 2023-03-27 DIAGNOSIS — R531 Weakness: Secondary | ICD-10-CM | POA: Diagnosis not present

## 2023-03-27 DIAGNOSIS — I480 Paroxysmal atrial fibrillation: Secondary | ICD-10-CM | POA: Diagnosis not present

## 2023-03-27 DIAGNOSIS — J9601 Acute respiratory failure with hypoxia: Secondary | ICD-10-CM | POA: Diagnosis not present

## 2023-03-27 DIAGNOSIS — F419 Anxiety disorder, unspecified: Secondary | ICD-10-CM | POA: Diagnosis not present

## 2023-03-27 DIAGNOSIS — I2699 Other pulmonary embolism without acute cor pulmonale: Secondary | ICD-10-CM | POA: Diagnosis not present

## 2023-03-27 DIAGNOSIS — W19XXXD Unspecified fall, subsequent encounter: Secondary | ICD-10-CM | POA: Diagnosis not present

## 2023-04-04 ENCOUNTER — Encounter: Payer: Self-pay | Admitting: Family Medicine

## 2023-04-04 ENCOUNTER — Non-Acute Institutional Stay: Payer: Medicare Other | Admitting: Family Medicine

## 2023-04-04 VITALS — BP 120/76 | HR 91 | Temp 97.9°F | Resp 22

## 2023-04-04 DIAGNOSIS — J9601 Acute respiratory failure with hypoxia: Secondary | ICD-10-CM

## 2023-04-04 DIAGNOSIS — E43 Unspecified severe protein-calorie malnutrition: Secondary | ICD-10-CM

## 2023-04-04 DIAGNOSIS — I776 Arteritis, unspecified: Secondary | ICD-10-CM

## 2023-04-04 DIAGNOSIS — J8281 Chronic eosinophilic pneumonia: Secondary | ICD-10-CM

## 2023-04-04 NOTE — Progress Notes (Signed)
Therapist, nutritional Palliative Care Consult Note Telephone: [phone]  Fax: [phone]   Date of encounter: 04/04/23 3:38 PM PATIENT NAME: [name] [address] [place] [address]   [phone] (home) [phone] (work) DOB: [birth year] MRN: [id number] PRIMARY CARE PROVIDER:    [name], MD,  [address] [place] [address] [phone]  REFERRING PROVIDER:   [name], MD [address] [place],  [place] [address] [phone]  Emergency Contact:    Contact Information     Name Relation Home Work Mobile   [place] Spouse [phone]  [phone]   Holley, Buren   [phone]       Health Care POA/Health Care Agent-wife   I met face to face with patient in [facility] facility. Palliative Care was asked to follow this patient by consultation request of [name], MD to address advance care planning and complex medical decision making. This is an initial skilled visit.  ADVANCE CARE PLANNING/GOALS OF CARE:   BARRIERS: Currently om skilled days at the facility. Wife not ready to talk with spouse about Palliative Care.   Advance directive instructions: If I have a terminal and incurable or if I am in a persistent vegetative state I desire that my life not be prolonged by extraordinary means and authorizes my physician to withhold or withdraw artificial nutrition and hydration which will likely result in my death in a short period of time. Document is witnessed and notarized June 13, 2006.  CODE STATUS: DNR Has MOST completed/signed Advised to bring signed original in to the facility and to carry with  Acute respiratory failure/eosinophilic acute pneumonia Took final antibiotic today. Continue O2 at 3 L/min [name] Continue to monitor symptoms for repeat fever, tachypnea or tachycardia and increased work of breathing.   2.  Atrial fibrillation Rate controlled. Continue beta  blocker Anticoagulated on Eliquis without bleeding.  3.  Aortitis Has completed all prescribed antibiotics Monitor and follow up with Infectious Disease  4.  Severe protein calorie malnutrition Latest Albumin during hospitalization was 2.6. Encourage high protein intake with supplements BID to TID to help with wound healing.         5.  Pressure ulcer Sacral stage I pressure ulcer-encouraged to frequent reposition, offload buttocks q 2  hours, use pressure reduction cushion for comfort. Encouraged to float heels.  6.   Palliative Care Encounter Bring original MOST to facility with pt's chart Encouraged on steps for energy conservation to allow for better therapy participation Educated on facility SW as liaison between therapists and pt family to discuss length of therapy through care plan meetings as well as needed next steps to continue pt's rehab, setting and DME required.                                                        Follow up Palliative Care Visit:  Palliative Care continuing to follow up by monitoring for changes in appetite, weight, functional and cognitive status for chronic disease progression and management in agreement with patient's stated goals of care. Next visit in 2 weeks or prn.  This visit was coded based on medical decision making (MDM).  Chief Complaint  Civil engineer, contracting Palliative Care received a referral to follow up with patient for chronic disease management following hospitalization for acute respiratory failure with eosinophilic  pneumonia and recent aortitis. Palliative Care is also following for advance directive planning and defining/refining goals of care.  HISTORY OF PRESENT ILLNESS: [name] is a 89 y.o. year old male with polymyalgia rheumatica who developed a pneumonia triggering DVT and Right pulmonary artery PE . He has a diagnosis of Polymyalgia rheumatica and has been on chronic steroids for decades.  He has been noted to have  chronic leukocytosis with most elevation of 18.8 on 02/28/23 and 25.4 om 02/07/23. He has a referral to follow up with Hematology.   Recent hospitalization for aortitis at the end of February at [facility] and was started on Daptomycin, Rocephin and Bactrim with PICC line.  He developed an acute PE in distal right main pulmonary artery on CT Chest extending into right middle and lower pulmonary arteries and was noted to have CT evidence of right heart strain.  CTA chest also noted possible multifocal pneumonia along with small bilateral pleural effusions.CTA also noted unchanged aneurysmal dilatation of ascending, transverse, and decending thoracic aorta without dissection. Dr Daiva Eves Infectious Disease was consulted and thought pneumonia less likely, wanted to keep Daptomycin but increase spectrum from Rocephin to Cefepime with no other changes.  Daptomycin was later d/c'd with concern for eosinophilic pneumonia on 03/05/23. Oral Doxycycline was given per ID recommendation and pt was treated for thrush with Fluconazole. He was subsequently treated with high dose steroids to taper. He was noted to have acute eosinophilic pneumonia and had developed SVT and respiratory failure, was started on O2 and had to be placed on non-rebreather then weaned down to 2L [place]. He has had some difficulty with constipation in the facility.  He has had some trouble sleeping and was started on Ativan. On 03/30/23 he was started on a 7 day course of Levaquin 500 mg daily for pneumonia along with BID probiotic. He was also started on DuoNeb tx BID and BID prn x 5 days which will complete today. He was also noted to have developed a sacral stage I ulcer during hospitalization. He had noted DVT in right peroneal veins on 03/07/23. He developed some delirium inpatient which was thought to be related possibly to high dose steroids.  Wife had requested that Palliative Nurse Practitioner not talk with patient initially but subsequently allowed exam. She states  that on nuclear med scan of heart that the abdominal aorta that had been grafted to repair the aneurysm in 2018 did not highlight like pt's spleen did which is normal so they did not think that he did actually have aortitis. She would like to be able to take pt back home.  She had purchased a donut cushion to help with the pain of his pressure ulcer on his buttocks.  Pt c/o severe, debilitating fatigue and is noted to have increased fatigue after minimal exertion. Denies nausea, vomiting.  Wife states he had an episode of nausea the day prior when he took his meds on an empty stomach.  Wife states he had 8 days where he didn't have a bowel movement then had to have a suppository and enema to be able to have a BM.  Minimal activity wears him out and he has had high anxiety/difficulty sleeping. She states he was receiving 120 mg steroids q 6 hrs while in ICU and didn't sleep for 4 days. He has a private paid sitter that he likes who comes to sit with him at night. Wife states that pt was discharged from [facility].  She reports that  they are doing Palliative to allow pt to receive benefit to see how patient will respond and what his needs are going to be.  If he does not improve then they may consider hospice services.  She reports pt does have a MOST form which indicates he does not want intubation.      ACTIVITIES OF DAILY LIVING: CONTINENT OF BLADDER/BOWEL? No  BATHING/DRESSING/FEEDING: limited assist bathing and dressing, independent with feeding  MOBILITY:   At d/c was only tolerating sitting on EOB for 30 sec.  Requires significant assistance for ambulation.  Noted destauration to low to mid 80s with sitting on EOB.  Walker for short distance with significant assistance   APPETITE? 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   Aortitis (HCC) 03/06/2023   Atrial flutter (HCC) 03/06/2023   FUO (fever of unknown origin) 03/06/2023   S/P insertion of endovascular thoracic aortic stent graft 03/06/2023   Acute pulmonary embolism (HCC) 03/05/2023   Acute respiratory failure with hypoxia (HCC) 03/05/2023   Acute hyponatremia 03/05/2023   Elevated troponin 03/05/2023   Lactic acidosis 03/05/2023   HCAP (healthcare-associated pneumonia) 03/05/2023   Chronic diastolic CHF (congestive heart failure) (HCC) 03/05/2023   BPH (benign prostatic hyperplasia) 03/05/2023   Fever 01/24/2023   S/P AAA repair 08/16/2017   Generalized weakness 04/03/2017   Abdominal pain, RUQ 03/28/2017   Sinusitis 03/14/2017   Skin tear of hand without complication, initial encounter 01/11/2017   Abrasion, multiple sites 01/11/2017   Mass of left hand 10/19/2016   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   AAA (abdominal aortic aneurysm) without rupture (HCC) 01/26/2016   Dupuytren's contracture of left hand 08/26/2014   Contracture of joint of finger of left hand 08/26/2014   Branch retinal vein occlusion 05/27/2014   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   Special screening for malignant neoplasm of prostate 02/18/2014   Urinary frequency 02/18/2014   Incomplete bladder emptying 02/18/2014   AAA (abdominal aortic aneurysm) (HCC) 01/07/2014   Hyperlipidemia 08/20/2013   PMR (polymyalgia rheumatica) (HCC) 08/20/2013   Unspecified vitamin D deficiency 08/20/2013   Impotence of organic origin 08/20/2013   Floater, vitreous 10/19/2012   Cerebrovascular disease 09/28/2012   Cataract, nuclear 03/14/2012   Corneal  macula not interfering with central vision 03/14/2012   COLONIC POLYPS, HX OF 03/16/2010   PAST MEDICAL HISTORY:  Active Ambulatory Problems    Diagnosis Date Noted   COLONIC POLYPS, HX OF 03/16/2010   Hyperlipidemia 08/20/2013   PMR (polymyalgia rheumatica) (HCC) 08/20/2013   Unspecified vitamin D deficiency 08/20/2013   Impotence of organic origin 08/20/2013   AAA (abdominal aortic aneurysm) (HCC) 01/07/2014   Special screening for malignant neoplasm of prostate 02/18/2014   Urinary frequency 02/18/2014   Incomplete bladder emptying 02/18/2014   Dupuytren's contracture of left hand 08/26/2014   Contracture of joint of finger of left hand 08/26/2014   AAA (abdominal aortic aneurysm) without rupture (HCC) 01/26/2016   Branch retinal vein occlusion 05/27/2014   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   Cataract, nuclear 03/14/2012   Corneal macula  not interfering with central vision 03/14/2012   Floater, vitreous 10/19/2012   Cerebrovascular disease 09/28/2012   Skin tear of hand without complication, initial encounter 01/11/2017   Abrasion, multiple sites 01/11/2017   Mass of left hand 10/19/2016   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   Sinusitis 03/14/2017   Abdominal pain, RUQ 03/28/2017   Generalized weakness 04/03/2017   S/P AAA repair 08/16/2017   Fever 01/24/2023   Acute pulmonary embolism (HCC) 03/05/2023   Acute respiratory failure with hypoxia (HCC) 03/05/2023   Acute hyponatremia 03/05/2023   Elevated troponin 03/05/2023   Lactic acidosis 03/05/2023   HCAP (healthcare-associated pneumonia) 03/05/2023   Chronic diastolic CHF (congestive heart failure) (HCC) 03/05/2023   BPH (benign prostatic hyperplasia) 03/05/2023   Pulmonary embolism (HCC) 03/06/2023   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   Aortitis (HCC) 03/06/2023   Atrial flutter (HCC) 03/06/2023   FUO (fever of unknown origin) 03/06/2023   S/P insertion of endovascular thoracic aortic stent graft 03/06/2023    Drug-induced insomnia (HCC) 03/07/2023   Anxiety 03/07/2023   DVT, lower extremity, distal, acute, right (HCC) 03/07/2023   PAF (paroxysmal atrial fibrillation) (HCC) 03/08/2023   Acute metabolic encephalopathy 03/13/2023   Paroxysmal A-fib (HCC) 03/13/2023   Pressure injury of skin 03/13/2023   Delirium 03/14/2023   Acute eosinophilic pneumonia 03/14/2023   Resolved Ambulatory Problems    Diagnosis Date Noted   Abdominal aneurysm without mention of rupture 01/01/2013   Impacted cerumen 04/09/2014   Past Medical History:  Diagnosis Date   Abnormal EKG    Allergic rhinitis    Allergy    Benign neoplasm of colon 04/14/10   Carpal tunnel syndrome    Cataract    Degenerative disc disease    Disturbances metabolism of methionine, homocystine, and cystathionine (HCC)    Elevated prostate specific antigen (PSA)    Hearing loss    Internal hemorrhoids    Neck pain    Otosclerosis of both ears    Peripheral vascular disease (HCC)    Plantar fasciitis    Polymyalgia rheumatica (HCC)    Rotator cuff syndrome of left shoulder    Scoliosis    Shoulder pain    SOCIAL HX:  Social History   Tobacco Use   Smoking status: Never   Smokeless tobacco: Never  Substance Use Topics   Alcohol use: No    Alcohol/week: 0.0 standard drinks of alcohol   FAMILY HX:  Family History  Problem Relation Age of Onset   Heart disease Mother    Emphysema Mother    Colon cancer Neg Hx    Esophageal cancer Neg Hx    Rectal cancer Neg Hx    Stomach cancer Neg Hx        Preferred Pharmacy: ALLERGIES:  Allergies  Allergen Reactions   Rosuvastatin Other (See Comments)    Stopped taking due to feeling achy     Codeine Nausea And Vomiting   Morphine And Related Nausea And Vomiting     PERTINENT MEDICATIONS:  Outpatient Encounter Medications as of 04/04/2023  Medication Sig   acetaminophen (TYLENOL) 325 MG tablet Take 2 tablets (650 mg total) by mouth every 6 (six) hours as needed for mild pain  (or Fever >/= 101).   apixaban (ELIQUIS) 5 MG TABS tablet Take 1 tablet (5 mg total) by mouth 2 (two) times daily.   aspirin 81 MG chewable tablet Chew 1 tablet (81 mg total) by mouth daily.   bisacodyl 5 MG EC tablet Take 10 mg by  mouth daily as needed for moderate constipation.   butalbital-acetaminophen-caffeine (FIORICET) 50-325-40 MG tablet Take 1 tablet by mouth daily as needed for headache.   cholecalciferol (VITAMIN D3) 25 MCG (1000 UNIT) tablet Take 1,000 Units by mouth daily.   dicyclomine (BENTYL) 10 MG/5ML solution Take 5 mLs (10 mg total) by mouth 3 (three) times daily as needed (reflux or epigastric pain).   famotidine (PEPCID) 20 MG tablet Take 1 tablet (20 mg total) by mouth daily.   fish oil-omega-3 fatty acids 1000 MG capsule Take 1,000 mg by mouth daily.    fluorometholone (FML) 0.1 % ophthalmic suspension Place 1 drop into the right eye at bedtime.    folic acid (FOLVITE) 1 MG tablet Take 1 mg by mouth daily.   hydrOXYzine (ATARAX) 50 MG tablet Take 1 tablet (50 mg total) by mouth every 6 (six) hours as needed for anxiety. (Patient taking differently: Take 50 mg by mouth 3 (three) times daily as needed for anxiety.)   L-Methylfolate-B12-B6-B2 (METAFOLBIC) 04-28-49-5 MG TABS TAKE ONE TABLET TWICE DAILY (Patient taking differently: Take 1 tablet by mouth 2 (two) times daily.)   melatonin 3 MG TABS tablet Take 1 tablet (3 mg total) by mouth at bedtime.   metoprolol tartrate (LOPRESSOR) 25 MG tablet Take 0.5 tablets (12.5 mg total) by mouth 2 (two) times daily.   Multiple Vitamins-Minerals (MULTIVITAMIN PO) Take 1 tablet by mouth daily.    pantoprazole (PROTONIX) 40 MG tablet Take 1 tablet (40 mg total) by mouth 2 (two) times daily for 30 days, THEN 1 tablet (40 mg total) daily.   predniSONE (DELTASONE) 20 MG tablet Take 2 tablets (40 mg total) by mouth daily with breakfast.   QUEtiapine (SEROQUEL) 25 MG tablet Take 0.5 tablets (12.5 mg total) by mouth daily.   QUEtiapine (SEROQUEL)  50 MG tablet Take 1 tablet (50 mg total) by mouth at bedtime.   tamsulosin (FLOMAX) 0.4 MG CAPS capsule Take 0.4 mg by mouth at bedtime.   traMADol (ULTRAM) 50 MG tablet Take 50 mg by mouth every 4 (four) hours as needed for moderate pain or severe pain.   diclofenac Sodium (VOLTAREN) 1 % GEL Apply 2 g topically 4 (four) times daily as needed (shoulder pain).   DOCOSAHEXAENOIC ACID PO Take 1 g by mouth daily.   feeding supplement (ENSURE ENLIVE / ENSURE PLUS) LIQD Take 237 mLs by mouth 3 (three) times daily between meals.   sulfamethoxazole-trimethoprim (BACTRIM DS) 800-160 MG tablet Take 1 tablet by mouth 3 (three) times a week. Monday, Wednesday, Friday (Patient taking differently: Take 1 tablet by mouth 3 (three) times a week. Monday, Wednesday, Friday for PJP prophylaxis)   Tart Cherry 1200 MG CAPS Take 1 tablet by mouth daily.   [DISCONTINUED] traZODone (DESYREL) 50 MG tablet Take 25 mg by mouth at bedtime as needed for sleep.   No facility-administered encounter medications on file as of 04/04/2023.    History obtained from review of EMR, discussion with primary team, and interview with family, facility staff/caregiver and/or patient.   CBC    Component Value Date/Time   WBC 22.3 (H) 03/23/2023 0315   RBC 3.09 (L) 03/23/2023 0315   HGB 9.8 (L) 03/23/2023 0315   HCT 28.8 (L) 03/23/2023 0315   PLT 338 03/23/2023 0315   MCV 93.2 03/23/2023 0315   MCH 31.7 03/23/2023 0315   MCHC 34.0 03/23/2023 0315   RDW 15.9 (H) 03/23/2023 0315   RDW 14.1 02/14/2014 0821   LYMPHSABS 0.8 03/23/2023 0315  LYMPHSABS 1.6 02/14/2014 0821   MONOABS 1.2 (H) 03/23/2023 0315   EOSABS 0.0 03/23/2023 0315   EOSABS 0.2 02/14/2014 0821   BASOSABS 0.0 03/23/2023 0315   BASOSABS 0.0 02/14/2014 0821    CMP    Latest Ref Rng & Units 03/23/2023    3:15 AM 03/22/2023   10:00 AM 03/19/2023   11:30 AM  CMP  Glucose 70 - 99 mg/dL 95  98  782   BUN 8 - 23 mg/dL 29  29  34   Creatinine 0.61 - 1.24 mg/dL 9.56   2.13  0.86   Sodium 135 - 145 mmol/L 135  137  139   Potassium 3.5 - 5.1 mmol/L 4.1  3.7  4.2   Chloride 98 - 111 mmol/L 98  100  101   CO2 22 - 32 mmol/L 28  30  29    Calcium 8.9 - 10.3 mg/dL 8.3  8.4  8.5     LFTs    Latest Ref Rng & Units 03/14/2023    4:17 AM 03/11/2023    4:19 AM 03/09/2023    1:43 AM  Hepatic Function  Total Protein 6.5 - 8.1 g/dL  5.6  5.6   Albumin 3.5 - 5.0 g/dL 2.2  2.5  1.8   AST 15 - 41 U/L  23  60   ALT 0 - 44 U/L  39  63   Alk Phosphatase 38 - 126 U/L  78  81   Total Bilirubin 0.3 - 1.2 mg/dL  0.8  0.4   Bilirubin, Direct 0.0 - 0.2 mg/dL  0.2      Urinalysis    Component Value Date/Time   COLORURINE AMBER (A) 03/05/2023 1845   APPEARANCEUR CLEAR 03/05/2023 1845   LABSPEC 1.017 03/05/2023 1845   PHURINE 6.0 03/05/2023 1845   GLUCOSEU NEGATIVE 03/05/2023 1845   HGBUR SMALL (A) 03/05/2023 1845   BILIRUBINUR NEGATIVE 03/05/2023 1845   KETONESUR NEGATIVE 03/05/2023 1845   PROTEINUR 100 (A) 03/05/2023 1845   NITRITE NEGATIVE 03/05/2023 1845   LEUKOCYTESUR NEGATIVE 03/05/2023 1845    HGB A1c 6.3% as if 03/07/23  BNP (last 3 results) Recent Labs    01/25/23 0345 03/05/23 1614  BNP 91.7 121.7*   On 03/29/23 at facility:  BMP remarkable for low Cr 0.62 and Calcium 7.9 with elevated BUN/Cr ratio of 29    I reviewed available labs, medications, imaging, studies and related documents from the EMR.  Records reviewed and summarized above.   Physical Exam: GENERAL: NAD LUNGS: Clear in upper lobes with fine crackles in BLL, no increased work of breathing on 3L [place] CARDIAC:  S1S2, RRR with faint MRG, N oedema/, cyanosis ABD:  Normo-active BS x 4 quads, soft, non-tender EXTREMITIES: Normal ROM, no deformity, strength equal, N muscle atrophy/subcutaneous fat loss NEURO:  Noted generalized weakness, cognitive impairment, aphasia-expressive/receptive  PSYCH:  non-anxious affect, A & O x 3  Thank you for the opportunity to participate in the care of  [name]. Please call our main office at [phone] if we can be of additional assistance.    Joycelyn Man FNP-C  [email or web address] .[facility]  Phone:  [phone]

## 2023-04-17 DIAGNOSIS — R109 Unspecified abdominal pain: Secondary | ICD-10-CM | POA: Diagnosis not present

## 2023-04-18 DIAGNOSIS — Z8679 Personal history of other diseases of the circulatory system: Secondary | ICD-10-CM | POA: Diagnosis not present

## 2023-04-19 ENCOUNTER — Telehealth: Payer: Self-pay | Admitting: Pulmonary Disease

## 2023-04-19 DIAGNOSIS — D638 Anemia in other chronic diseases classified elsewhere: Secondary | ICD-10-CM | POA: Diagnosis not present

## 2023-04-19 DIAGNOSIS — F428 Other obsessive-compulsive disorder: Secondary | ICD-10-CM | POA: Diagnosis not present

## 2023-04-19 DIAGNOSIS — F32A Depression, unspecified: Secondary | ICD-10-CM | POA: Diagnosis not present

## 2023-04-19 DIAGNOSIS — F4323 Adjustment disorder with mixed anxiety and depressed mood: Secondary | ICD-10-CM | POA: Diagnosis not present

## 2023-04-19 NOTE — Telephone Encounter (Signed)
Darl Pikes wife would like to speak to Dr. Francine Graven. Darl Pikes phone number is [phone].

## 2023-04-19 NOTE — Telephone Encounter (Signed)
Spoke with patients wife. She states patient is in rehab and has canceled his f/u for tomorrow- wasn't sure he would be strong enough to come in. She was wanting Dr. Francine Graven to come see him tomorrow and I advised our providers didn't do that. F/u has been rescheduled for 6/3.  Wife is wanting to know if Dr. Francine Graven can give her phone call if available. Not really sure what she is wanting. Patient is recovering in rehab and sounds like he's doing okay

## 2023-04-19 NOTE — Telephone Encounter (Signed)
I could see patient in video visit if they wanted to set that up while patient is in rehab otherwise we will keep the visit on 6/3.  Thanks, JD

## 2023-04-20 ENCOUNTER — Inpatient Hospital Stay: Payer: Medicare Other | Admitting: Pulmonary Disease

## 2023-04-20 DIAGNOSIS — D638 Anemia in other chronic diseases classified elsewhere: Secondary | ICD-10-CM | POA: Diagnosis not present

## 2023-04-21 ENCOUNTER — Encounter: Payer: Self-pay | Admitting: Family Medicine

## 2023-04-21 ENCOUNTER — Non-Acute Institutional Stay: Payer: Medicare Other | Admitting: Family Medicine

## 2023-04-21 DIAGNOSIS — F320 Major depressive disorder, single episode, mild: Secondary | ICD-10-CM | POA: Diagnosis not present

## 2023-04-21 DIAGNOSIS — F4323 Adjustment disorder with mixed anxiety and depressed mood: Secondary | ICD-10-CM | POA: Insufficient documentation

## 2023-04-21 NOTE — Progress Notes (Signed)
Therapist, nutritional Palliative Care Consult Note Telephone: [phone]  Fax: [phone]   Date of encounter: 04/21/23 2:14 PM PATIENT NAME: [name] [address] [place] [address]   [phone] (home) [phone] (work) DOB: 11-14-1930 MRN: [id number] PRIMARY CARE PROVIDER:    [name], MD,  [address] [place] [address] [phone]  REFERRING PROVIDER:   [name], MD [address] [place],  [place] [address] [phone]  Emergency Contact:    Contact Information     Name Relation Home Work Mobile   Shelby Spouse [phone]  [phone]   [place], [place]   [phone]       Health Care POA/Health Care Agent-wife   I met face to face with patient in [facility] facility. Palliative Care was asked to follow this patient by consultation request of [name], MD to address advance care planning and complex medical decision making. This is a follow up visit.    Chief c/o: Wife is asking if pt may be ready for Hospice care.  HPI:  Micah Flesher to see pt who was being visited by his priest (the [employer]) so deferred exam to talk with facility staff and wife.  Wife states that pt expresses "being tired" and she thinks he may have given up.  She says that he declined therapy this am but she and their son were able to talk him into participating when they came. She says he seems to perk up with these visitors some.  Spoke with the nurse who indicated in the 5 weeks since admission pt has lost 5 lbs. Nursing staff indicates that last weekend he was wheeling himself around and using his O2 about 80% of the time.  Currently he needs O2 prn.  PT states that as of 2-3 days ago when this particular therapist worked with him that he was having significant anxiety because of not being able to regulate his breathing that he became more anxious and in turn more short of breath.  She stated that he gets anxious and very  fatigued throwing himself into the chair. Wife states he wants to go home and is tired of being in the facility.  He has been slowly decreasing his WBC (starting in 20K, now 14.9K) and has had treatment with both steroids (120 mg Q 6 hours while inpatient with slow taper) and Levaquin since being at the facility.  Wife is anxious about his decline but states at times he is better.  She had requested this provider come evaluate him for possible hospice.  She had also set up for him to see a psychiatric provider to do an assessment today.  On attempting to go back and see the patient the psychiatric provider indicated that she was expected and was going to see the patient while this provider was trying to contact the wife.  At the present time I believe that pt's mental state and adjustment reaction are impeding his progress and will defer to psychiatric provider for assessment.  Will re-evaluate next week to allow psychiatric assessment completion and have informed wife of same, encouraging her to call if anything changes. Wife is agreeable to same.  History obtained from review of EMR, discussion with primary team, and interview with family, facility staff/caregiver and/or patient.        HGB A1c 6.3% as if 03/07/23  On 04/20/23 at facility: CMP with normal Cr 0.81, BUN 19, eGFR 84 and low Calcium 7.8, Albumin 2.7  WBC elevated at 14.9 (  down from 16), HGB 7.7 (up from 7.4)   I reviewed available labs, medications, imaging, studies and related documents from the EMR.  Records reviewed and summarized above.    Thank you for the opportunity to participate in the care of [facility] [employer]. Please call our main office at [phone] if we can be of additional assistance.    Joycelyn Man FNP-C  [email or web address] .[facility]  Phone:  [phone]

## 2023-04-21 NOTE — Telephone Encounter (Signed)
No sooner openings on Dr. Lanora Manis schedule to add a video call if pt/spouse is able to do so.

## 2023-04-23 DIAGNOSIS — F320 Major depressive disorder, single episode, mild: Secondary | ICD-10-CM | POA: Diagnosis not present

## 2023-04-26 ENCOUNTER — Ambulatory Visit: Payer: Medicare Other | Admitting: Cardiovascular Disease

## 2023-04-27 NOTE — Progress Notes (Signed)
Cardiology Office Note:    Date:  04/28/2023   ID:  [name], DOB [birth year], MRN [id number]  PCP:  [name], MD  Cardiologist:  Little Ishikawa, MD  Electrophysiologist:  None   Referring MD: [name], MD   Chief Complaint  Patient presents with   Chest Pain    History of Present Illness:    [name] is a 89 y.o. male with a hx of abdominal aortic aneurysm status post stent graft in 2018, chronic diastolic heart failure, pulmonary embolism 02/2023, hyperlipidemia, polymyalgia rheumatica who is referred by Dr Waynard Edwards for evaluation of chest pain.  Admitted for 03/05/2023 with acute hypoxic respiratory failure in setting of pneumonia and acute PE.  He was seen by cardiology during admission for atrial fibrillation, thought to be secondary to acute pulmonary issues.  He converted to sinus rhythm on IV amiodarone and was discharged on Lopressor and Eliquis.  Echocardiogram 03/06/2023 showed EF 55 to 60%, abnormal septal motion, normal RV size/systolic function.  Home BP log shows BP 100s to 130s over 60s to 90s.  He reports pain in abdomen when exercising and feels short of breath.  He denies any chest pain.  Denies any lightheadedness, syncope, lower extremity edema, or palpitations.  He is taking Eliquis, denies any bleeding issues.   Past Medical History:  Diagnosis Date   AAA (abdominal aortic aneurysm) (HCC)    Abnormal EKG    Left atrial abnormality   Allergic rhinitis    Allergy    Benign neoplasm of colon 04/14/10   3 small polyps on colonoscopy by Dr. Marina Goodell   Carpal tunnel syndrome    Cataract    Degenerative disc disease    Disturbances metabolism of methionine, homocystine, and cystathionine (HCC)    Elevated homocysteine   Elevated prostate specific antigen (PSA)    Hearing loss    Hyperlipidemia    Impotence of organic origin    Penile implant   Internal hemorrhoids    Neck pain    Otosclerosis of both ears    Peripheral vascular disease  (HCC)    Bilateral femoral bruit   Plantar fasciitis    Polymyalgia rheumatica (HCC)    Rotator cuff syndrome of left shoulder    Scoliosis    Shoulder pain     Past Surgical History:  Procedure Laterality Date   ABDOMINAL AORTIC ENDOVASCULAR STENT GRAFT N/A 08/16/2017   Procedure: ABDOMINAL AORTIC ENDOVASCULAR STENT GRAFT insertion;  Surgeon: Nada Libman, MD;  Location: [facility] OR;  Service: Vascular;  Laterality: N/A;   Actinic keratosis removal  06/21/2010   Left shoulder; Dr. 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   Procedure: DUPUYTREN CONTRACTURE RELEASE RIGHT LONG, RING AND SMALL FINGERS;  Surgeon: Wyn Forster., MD;  Location: [facility];  Service: Orthopedics;  Laterality: Right;   Implant penile pump  2001   IR ANGIOGRAM PELVIS SELECTIVE OR SUPRASELECTIVE  12/11/2020   IR ANGIOGRAM SELECTIVE EACH ADDITIONAL VESSEL  12/11/2020   IR EMBO ARTERIAL NOT HEMORR HEMANG INC GUIDE ROADMAPPING  12/11/2020   IR RADIOLOGIST EVAL & MGMT  07/30/2020   IR US GUIDE VASC ACCESS RIGHT  12/11/2020   Resection of appendix and tip of rectum  February 2005   STAPEDECTOMY Bilateral 1985, 1988   [employer]    Current Medications: Current Meds  Medication Sig   acetaminophen (TYLENOL) 325 MG tablet  Take 2 tablets (650 mg total) by mouth every 6 (six) hours as needed for mild pain (or Fever >/= 101).   acyclovir (ZOVIRAX) 400 MG tablet Take 400 mg by mouth 2 (two) times daily.   apixaban (ELIQUIS) 5 MG TABS tablet Take 1 tablet (5 mg total) by mouth 2 (two) times daily.   aspirin 81 MG chewable tablet Chew 1 tablet (81 mg total) by mouth daily.   butalbital-acetaminophen-caffeine (FIORICET) 50-325-40 MG tablet Take 1 tablet by mouth daily as needed for headache.   cholecalciferol (VITAMIN D3) 25 MCG (1000 UNIT) tablet Take 1,000 Units by mouth daily.   dicyclomine (BENTYL) 10 MG/5ML solution Take 5 mLs (10 mg  total) by mouth 3 (three) times daily as needed (reflux or epigastric pain).   docusate sodium (COLACE) 100 MG capsule Take 100 mg by mouth 2 (two) times daily.   famotidine (PEPCID) 20 MG tablet Take 1 tablet (20 mg total) by mouth daily.   feeding supplement (ENSURE ENLIVE / ENSURE PLUS) LIQD Take 237 mLs by mouth 3 (three) times daily between meals.   ferrous sulfate 324 MG TBEC Take 324 mg by mouth daily. As needed   fish oil-omega-3 fatty acids 1000 MG capsule Take 1,000 mg by mouth daily.    fluorometholone (FML) 0.1 % ophthalmic suspension Place 1 drop into the right eye at bedtime.    folic acid (FOLVITE) 1 MG tablet Take 1 mg by mouth daily.   L-Methylfolate-B12-B6-B2 (METAFOLBIC) 04-28-49-5 MG TABS TAKE ONE TABLET TWICE DAILY (Patient taking differently: Take 1 tablet by mouth 2 (two) times daily.)   linaclotide (LINZESS) 72 MCG capsule Take 72 mcg by mouth daily before breakfast.   LORazepam (ATIVAN) 0.5 MG tablet Take 0.5 mg by mouth daily.   magnesium hydroxide (MILK OF MAGNESIA) 400 MG/5ML suspension Take by mouth daily as needed for mild constipation.   melatonin 3 MG TABS tablet Take 1 tablet (3 mg total) by mouth at bedtime. (Patient taking differently: Take 5 mg by mouth as needed.)   Mirtazapine (REMERON PO) Take by mouth.   Multiple Vitamins-Minerals (MULTIVITAMIN PO) Take 1 tablet by mouth daily.    pantoprazole (PROTONIX) 40 MG tablet Take 1 tablet (40 mg total) by mouth 2 (two) times daily for 30 days, THEN 1 tablet (40 mg total) daily.   predniSONE (DELTASONE) 20 MG tablet Take 2 tablets (40 mg total) by mouth daily with breakfast.   senna-docusate (SENOKOT-S) 8.6-50 MG tablet Take 1 tablet by mouth 2 (two) times daily.   sulfamethoxazole-trimethoprim (BACTRIM DS) 800-160 MG tablet Take 1 tablet by mouth 3 (three) times a week. Monday, Wednesday, Friday (Patient taking differently: Take 1 tablet by mouth 3 (three) times a week. Monday, Wednesday, Friday for PJP prophylaxis)    tamsulosin (FLOMAX) 0.4 MG CAPS capsule Take 0.4 mg by mouth at bedtime.   traMADol (ULTRAM) 50 MG tablet Take 50 mg by mouth every 4 (four) hours as needed for moderate pain or severe pain.   traZODone (DESYREL) 50 MG tablet Take 50 mg by mouth as needed.   [DISCONTINUED] metoprolol tartrate (LOPRESSOR) 25 MG tablet Take 0.5 tablets (12.5 mg total) by mouth 2 (two) times daily.     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   Social Determinants of Health   Financial Resource Strain: Low Risk  (03/07/2023)   Overall Financial Resource Strain (CARDIA)    Difficulty of Paying Living Expenses: Not hard at all  Food Insecurity: No Food Insecurity (03/15/2023)   Hunger Vital Sign    Worried About Running Out of Food in the Last Year: Never true    Ran Out of Food in the Last Year: Never true  Transportation Needs: No Transportation Needs (03/15/2023)   PRAPARE - Administrator, Civil Service (Medical): No    Lack of Transportation (Non-Medical): No  Physical Activity: Not on file  Stress: Not on file  Social Connections: Not on file     Family History: The patient's family history includes Emphysema in his mother; Heart disease in his mother. There is no history of Colon  cancer, Esophageal cancer, Rectal cancer, or Stomach cancer.  ROS:   Please see the history of present illness.     All other systems reviewed and are negative.  EKGs/Labs/Other Studies Reviewed:    The following studies were reviewed today:   EKG:   04/28/2023: Sinus tachycardia, PVCs, rate 109, LVH  Recent Labs: 03/05/2023: B Natriuretic Peptide 121.7; TSH 1.869 03/11/2023: ALT 39 03/22/2023: Magnesium 1.9 03/23/2023: BUN 29; Creatinine, Ser 0.87; Hemoglobin 9.8; Platelets 338; Potassium 4.1; Sodium 135  Recent Lipid Panel    Component Value Date/Time   CHOL 148 09/26/2016 0804   CHOL 138 03/21/2016 0819   TRIG 71 09/26/2016 0804   HDL 52 09/26/2016 0804   HDL 56 03/21/2016 0819   CHOLHDL 2.8 09/26/2016 0804   VLDL 14 09/26/2016 0804   LDLCALC 82 09/26/2016 0804   LDLCALC 68 03/21/2016 0819    Physical Exam:    VS:  BP 98/78 (BP Location: Left Arm, Patient Position: Sitting, Cuff Size: Small)   Pulse (!) 109   Ht 6' (1.829 m)   Wt 141 lb (64 kg)   SpO2 100%   BMI 19.12 kg/m     Wt Readings from Last 3 Encounters:  04/28/23 141 lb (64 kg)  03/23/23 143 lb 4.8 oz (65 kg)  01/25/23 173 lb 6.4 oz (78.7 kg)     GEN:  Well nourished, well developed in no acute distress HEENT: Normal NECK: No JVD; No carotid bruits LYMPHATICS: No lymphadenopathy CARDIAC: RRR, no murmurs, rubs, gallops RESPIRATORY:  Clear to auscultation without rales, wheezing or rhonchi  ABDOMEN: Soft, non-tender, non-distended MUSCULOSKELETAL:  No edema; No deformity  SKIN: Warm and dry NEUROLOGIC:  Alert and oriented x 3 PSYCHIATRIC:  Normal affect   ASSESSMENT:    1. PAF (paroxysmal atrial fibrillation) (HCC)   2. DOE (dyspnea on exertion)   3. Acute pulmonary embolism without acute cor pulmonale, unspecified pulmonary embolism type (HCC)    PLAN:    Dyspnea/abdominal pain: He was referred for evaluation of chest pain, but he denies any chest pain to me.  States that he feels pain in his  abdomen and shortness of breath with exertion.  Suspect this is related to his recent pneumonia and PE.  Despite age, only mild coronary calcifications on CTPA 02/2023.  No further cardiac workup recommended at this time  Paroxysmal atrial fibrillation: Noted during hospitalization 02/2023.  Started on IV amiodarone and converted to sinus rhythm.  Discharged on Lopressor and Eliquis.  Echocardiogram 03/06/2023 showed EF 55 to 60%, abnormal septal motion, normal RV size/systolic function. -Continue Eliquis -Soft BP, and given suspected atrial fibrillation was in setting of acute illness, would hold metoprolol.  Check Zio patch x 7 days to evaluate for further A-fib  PE/DVT: Admitted with acute PE 02/2023.  Started on Eliquis  ?  Aortitis: Completed course of antibiotics per ID 03/23/2023  AAA: Follows with vascular surgery, max aortic diameter 6.6 cm with delayed type II endoleak  Thoracic aortic aneurysm: Measures 5.1 cm, follows with vascular surgery  PMR: On prednisone  RTC in 3 months   Medication Adjustments/Labs and Tests Ordered: Current medicines are reviewed at length with the patient today.  Concerns regarding medicines are outlined above.  Orders Placed This Encounter  Procedures   LONG TERM MONITOR (3-14 DAYS)   EKG 12-Lead   No orders of the defined types were placed in this encounter.   Patient Instructions  Medication Instructions:  STOP metoprolol  *If you need a refill on your cardiac medications before your next appointment, please call your pharmacy*  Testing/Procedures: ZIO XT- Long Term Monitor Instructions   Your physician has requested you wear a ZIO patch monitor for _7_ days.  This is a single patch monitor.   [employer] supplies one patch monitor per enrollment. Additional stickers are not available. Please do not apply patch if you will be having a Nuclear Stress Test, Echocardiogram, Cardiac CT, MRI, or Chest Xray during the period you would be wearing the  monitor. The patch cannot be worn during these tests. You cannot remove and re-apply the ZIO XT patch monitor.  Your ZIO patch monitor will be sent Fed Ex from [employer] directly to your home address. It may take 3-5 days to receive your monitor after you have been enrolled.  Once you have received your monitor, please review the enclosed instructions. Your monitor has already been registered assigning a specific monitor serial # to you.  Billing and Patient Assistance Program Information   We have supplied [employer] with any of your insurance information on file for billing purposes. [employer] offers a sliding scale Patient Assistance Program for patients that do not have insurance, or whose insurance does not completely cover the cost of the ZIO monitor.   You must apply for the Patient Assistance Program to qualify for this discounted rate.     To apply, please call [employer] at [phone], select option 4, then select option 2, and ask to apply for Patient Assistance Program.  Meredeth Ide will ask your household income, and how many people are in your household.  They will quote your out-of-pocket cost based on that information.  [employer] will also be able to set up a 76-month, interest-free payment plan if needed.  Applying the monitor   Shave hair from upper left chest.  Hold abrader disc by orange tab. Rub abrader in 40 strokes over the upper left chest as indicated in your monitor instructions.  Clean area with 4 enclosed alcohol pads. Let dry.  Apply patch as indicated in monitor instructions. Patch will be placed under  collarbone on left side of chest with arrow pointing upward.  Rub patch adhesive wings for 2 minutes. Remove white label marked "1". Remove the white label marked "2". Rub patch adhesive wings for 2 additional minutes.  While looking in a mirror, press and release button in center of patch. A small green light will flash 3-4 times. This will be your only indicator that the  monitor has been turned on. ?  Do not shower for the first 24 hours. You may shower after the first 24 hours.  Press the button if you feel a symptom. You will hear a small click. Record Date, Time and Symptom in the Patient Logbook.  When you are ready to remove the patch, follow instructions on the last 2 pages of the Patient Logbook. Stick patch monitor onto the last page of Patient Logbook.  Place Patient Logbook in the blue and white box.  Use locking tab on box and tape box closed securely.  The blue and white box has prepaid postage on it. Please place it in the mailbox as soon as possible. Your physician should have your test results approximately 7 days after the monitor has been mailed back to [facility].  Call [facility] Customer Care at [phone] if you have questions regarding your ZIO XT patch monitor. Call them immediately if you see an orange light blinking on your monitor.  If your monitor falls off in less than 4 days, contact our Monitor department at [phone]. ?If your monitor becomes loose or falls off after 4 days call [employer] at [phone] for suggestions on securing your monitor.?   Follow-Up: At [facility], you and your health needs are our priority.  As part of our continuing mission to provide you with exceptional heart care, we have created designated Provider Care Teams.  These Care Teams include your primary Cardiologist (physician) and Advanced Practice Providers (APPs -  Physician Assistants and Nurse Practitioners) who all work together to provide you with the care you need, when you need it.  We recommend signing up for the patient portal called "MyChart".  Sign up information is provided on this After Visit Summary.  MyChart is used to connect with patients for Virtual Visits (Telemedicine).  Patients are able to view lab/test results, encounter notes, upcoming appointments, etc.  Non-urgent messages can be sent to your provider as well.    To learn more about what you can do with MyChart, go to [email or web address].    Your next appointment:   3 month(s)  Provider:   Little Ishikawa, MD        Signed, Little Ishikawa, MD  04/28/2023 9:46 AM    [facility]

## 2023-04-28 ENCOUNTER — Ambulatory Visit: Payer: Medicare Other | Attending: Cardiovascular Disease | Admitting: Cardiology

## 2023-04-28 ENCOUNTER — Other Ambulatory Visit: Payer: Self-pay | Admitting: *Deleted

## 2023-04-28 ENCOUNTER — Ambulatory Visit (INDEPENDENT_AMBULATORY_CARE_PROVIDER_SITE_OTHER): Payer: Medicare Other

## 2023-04-28 VITALS — BP 98/78 | HR 109 | Ht 72.0 in | Wt 141.0 lb

## 2023-04-28 DIAGNOSIS — I480 Paroxysmal atrial fibrillation: Secondary | ICD-10-CM | POA: Insufficient documentation

## 2023-04-28 DIAGNOSIS — I2699 Other pulmonary embolism without acute cor pulmonale: Secondary | ICD-10-CM | POA: Diagnosis not present

## 2023-04-28 DIAGNOSIS — R0609 Other forms of dyspnea: Secondary | ICD-10-CM | POA: Diagnosis not present

## 2023-04-28 NOTE — Patient Instructions (Signed)
Medication Instructions:  STOP metoprolol  *If you need a refill on your cardiac medications before your next appointment, please call your pharmacy*  Testing/Procedures: ZIO XT- Long Term Monitor Instructions   Your physician has requested you wear a ZIO patch monitor for _7_ days.  This is a single patch monitor.   [employer] supplies one patch monitor per enrollment. Additional stickers are not available. Please do not apply patch if you will be having a Nuclear Stress Test, Echocardiogram, Cardiac CT, MRI, or Chest Xray during the period you would be wearing the monitor. The patch cannot be worn during these tests. You cannot remove and re-apply the ZIO XT patch monitor.  Your ZIO patch monitor will be sent Fed Ex from [employer] directly to your home address. It may take 3-5 days to receive your monitor after you have been enrolled.  Once you have received your monitor, please review the enclosed instructions. Your monitor has already been registered assigning a specific monitor serial # to you.  Billing and Patient Assistance Program Information   We have supplied [employer] with any of your insurance information on file for billing purposes. [employer] offers a sliding scale Patient Assistance Program for patients that do not have insurance, or whose insurance does not completely cover the cost of the ZIO monitor.   You must apply for the Patient Assistance Program to qualify for this discounted rate.     To apply, please call [employer] at [phone], select option 4, then select option 2, and ask to apply for Patient Assistance Program.  Meredeth Ide will ask your household income, and how many people are in your household.  They will quote your out-of-pocket cost based on that information.  [employer] will also be able to set up a 75-month, interest-free payment plan if needed.  Applying the monitor   Shave hair from upper left chest.  Hold abrader disc by orange tab. Rub abrader in 40 strokes  over the upper left chest as indicated in your monitor instructions.  Clean area with 4 enclosed alcohol pads. Let dry.  Apply patch as indicated in monitor instructions. Patch will be placed under collarbone on left side of chest with arrow pointing upward.  Rub patch adhesive wings for 2 minutes. Remove white label marked "1". Remove the white label marked "2". Rub patch adhesive wings for 2 additional minutes.  While looking in a mirror, press and release button in center of patch. A small green light will flash 3-4 times. This will be your only indicator that the monitor has been turned on. ?  Do not shower for the first 24 hours. You may shower after the first 24 hours.  Press the button if you feel a symptom. You will hear a small click. Record Date, Time and Symptom in the Patient Logbook.  When you are ready to remove the patch, follow instructions on the last 2 pages of the Patient Logbook. Stick patch monitor onto the last page of Patient Logbook.  Place Patient Logbook in the blue and white box.  Use locking tab on box and tape box closed securely.  The blue and white box has prepaid postage on it. Please place it in the mailbox as soon as possible. Your physician should have your test results approximately 7 days after the monitor has been mailed back to [facility].  Call [facility] Customer Care at [phone] if you have questions regarding your ZIO XT patch monitor. Call them immediately if you see an orange light  blinking on your monitor.  If your monitor falls off in less than 4 days, contact our Monitor department at [phone]. ?If your monitor becomes loose or falls off after 4 days call [employer] at [phone] for suggestions on securing your monitor.?   Follow-Up: At [facility], you and your health needs are our priority.  As part of our continuing mission to provide you with exceptional heart care, we have created designated Provider Care Teams.  These  Care Teams include your primary Cardiologist (physician) and Advanced Practice Providers (APPs -  Physician Assistants and Nurse Practitioners) who all work together to provide you with the care you need, when you need it.  We recommend signing up for the patient portal called "MyChart".  Sign up information is provided on this After Visit Summary.  MyChart is used to connect with patients for Virtual Visits (Telemedicine).  Patients are able to view lab/test results, encounter notes, upcoming appointments, etc.  Non-urgent messages can be sent to your provider as well.   To learn more about what you can do with MyChart, go to [email or web address].    Your next appointment:   3 month(s)  Provider:   Little Ishikawa, MD

## 2023-04-28 NOTE — Progress Notes (Unsigned)
Enrolled for [employer] to mail a ZIO XT long term holter monitor to [facility] [address], [place], [place]  [address]

## 2023-05-01 ENCOUNTER — Ambulatory Visit (INDEPENDENT_AMBULATORY_CARE_PROVIDER_SITE_OTHER): Payer: Medicare Other

## 2023-05-01 ENCOUNTER — Ambulatory Visit (INDEPENDENT_AMBULATORY_CARE_PROVIDER_SITE_OTHER): Payer: Medicare Other | Admitting: Pulmonary Disease

## 2023-05-01 ENCOUNTER — Encounter: Payer: Self-pay | Admitting: Pulmonary Disease

## 2023-05-01 VITALS — BP 116/72 | HR 62

## 2023-05-01 DIAGNOSIS — J8281 Chronic eosinophilic pneumonia: Secondary | ICD-10-CM

## 2023-05-01 DIAGNOSIS — E43 Unspecified severe protein-calorie malnutrition: Secondary | ICD-10-CM

## 2023-05-01 DIAGNOSIS — J849 Interstitial pulmonary disease, unspecified: Secondary | ICD-10-CM

## 2023-05-01 DIAGNOSIS — J189 Pneumonia, unspecified organism: Secondary | ICD-10-CM | POA: Diagnosis not present

## 2023-05-01 DIAGNOSIS — R918 Other nonspecific abnormal finding of lung field: Secondary | ICD-10-CM | POA: Diagnosis not present

## 2023-05-01 DIAGNOSIS — K5900 Constipation, unspecified: Secondary | ICD-10-CM | POA: Diagnosis not present

## 2023-05-01 DIAGNOSIS — J9611 Chronic respiratory failure with hypoxia: Secondary | ICD-10-CM

## 2023-05-01 DIAGNOSIS — F320 Major depressive disorder, single episode, mild: Secondary | ICD-10-CM | POA: Diagnosis not present

## 2023-05-01 NOTE — Patient Instructions (Addendum)
Bowel Regimen: - Continue senna-docusate 8.6-50mg  twice daily - Start Miralax 17g twice daily - Use bisocodyl 5mg  as needed for constipation - Stop docusate 100mg  twice daily - Stop Linzess - stop milk of magnesia  Continue prednisone to 20mg  daily until follow up visit.  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  Continue bactrim 1 tablet 3 days per week. We will continue this until below 20mg  of prednisone daily.  Stop famotidine 20mg  daily  Continue pantoprazole 40mg  daily  We will check x-rays of your chest and abdomen today  Follow up in 1 month via video visit

## 2023-05-01 NOTE — Progress Notes (Unsigned)
Synopsis: Referred in June for 2024 for hospital follow up for eosinophilic pneumonia  Subjective:   PATIENT ID: [name] GENDER: male DOB: 1930/04/28, MRN: [id number]  HPI  Chief Complaint  Patient presents with   Hospitalization Follow-up    HFU. Still using 4L of O2.    [name] is a 89 year old male with history of polymyalgia rheumatica on chronic prednisone and recent hospitalization at [facility] where he was diagnosed with aortitis and started on daptomycin and rocephin via PICC line on 3/14. He presented to [facility] on 4/7 with generalized weakness and shortness of breath. CTA Chest on admission showed pulmonary embolism and bilateral patchy ground-glass and consolidative opacities with interlobular septal thickening superimposed on back ground subpleural reticulation and bronchiolectasis. Echocardiogram did not show RV strain. He was started on heparin drip. He required transfer to the ICU for acute hypoxemic respiratory failure with heated high flow oxygen requirements. He was started on pulse dose steroids and broad spectrum antibiotics. Bronchoscopy was not performed due to his significant oxygen requirements. He was transferred out of the ICU on 4/18 with decreasing O2 requirements. He was discharged on 40mg  of prednisone daily and bactrim prophylaxis. He was started on nystatinc for thrush. He was discharged on 1-2L of oxygen and went to rehab.   His prednisone was reduced to 20mg  daily  He is working on walking with a walker, he walked 32 steps yesterday without assistance.   He started having bowel trouble at [facility] rehab for about a month.   NP Holly and Dr. Leroy Kennedy     Past Medical History:  Diagnosis Date   AAA (abdominal aortic aneurysm) (HCC)    Abnormal EKG    Left atrial abnormality   Allergic rhinitis    Allergy    Benign neoplasm of colon 04/14/10   3 small polyps on colonoscopy by Dr. Marina Goodell   Carpal tunnel syndrome    Cataract     Degenerative disc disease    Disturbances metabolism of methionine, homocystine, and cystathionine (HCC)    Elevated homocysteine   Elevated prostate specific antigen (PSA)    Hearing loss    Hyperlipidemia    Impotence of organic origin    Penile implant   Internal hemorrhoids    Neck pain    Otosclerosis of both ears    Peripheral vascular disease (HCC)    Bilateral femoral bruit   Plantar fasciitis    Polymyalgia rheumatica (HCC)    Rotator cuff syndrome of left shoulder    Scoliosis    Shoulder pain      Family History  Problem Relation Age of Onset   Heart disease Mother    Emphysema Mother    Colon cancer Neg Hx    Esophageal cancer Neg Hx    Rectal cancer Neg Hx    Stomach cancer Neg Hx      Social History   Socioeconomic History   Marital status: Married    Spouse name: Gottfried Gionfriddo   Number of children: 2   Years of education: Not on file   Highest education level: Not on file  Occupational History   Occupation: Dealer: [employer]  Tobacco Use   Smoking status: Never   Smokeless tobacco: Never  Vaping Use   Vaping Use: Never used  Substance and Sexual Activity   Alcohol use: No    Alcohol/week: 0.0 standard drinks of alcohol   Drug use: No  Sexual activity: Not Currently  Other Topics Concern   Not on file  Social History Narrative   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   Social Determinants of Health   Financial Resource Strain: Low Risk  (03/07/2023)   Overall Financial Resource Strain (CARDIA)    Difficulty of Paying Living Expenses: Not hard at all  Food Insecurity: No Food Insecurity (03/15/2023)   Hunger Vital Sign    Worried About Running Out of Food in the Last Year: Never true    Ran Out of Food in the Last Year: Never true  Transportation Needs: No Transportation Needs (03/15/2023)   PRAPARE - Administrator, Civil Service (Medical):  No    Lack of Transportation (Non-Medical): No  Physical Activity: Not on file  Stress: Not on file  Social Connections: Not on file  Intimate Partner Violence: Not At Risk (03/15/2023)   Humiliation, Afraid, Rape, and Kick questionnaire    Fear of Current or Ex-Partner: No    Emotionally Abused: No    Physically Abused: No    Sexually Abused: No     Allergies  Allergen Reactions   Rosuvastatin Other (See Comments)    Stopped taking due to feeling achy     Codeine Nausea And Vomiting   Morphine And Codeine Nausea And Vomiting     Outpatient Medications Prior to Visit  Medication Sig Dispense Refill   acetaminophen (TYLENOL) 325 MG tablet Take 2 tablets (650 mg total) by mouth every 6 (six) hours as needed for mild pain (or Fever >/= 101).     apixaban (ELIQUIS) 5 MG TABS tablet Take 1 tablet (5 mg total) by mouth 2 (two) times daily. 60 tablet 1   aspirin 81 MG chewable tablet Chew 1 tablet (81 mg total) by mouth daily.     bisacodyl 5 MG EC tablet Take 10 mg by mouth daily as needed for moderate constipation.     butalbital-acetaminophen-caffeine (FIORICET) 50-325-40 MG tablet Take 1 tablet by mouth daily as needed for headache. 14 tablet 0   cholecalciferol (VITAMIN D3) 25 MCG (1000 UNIT) tablet Take 1,000 Units by mouth daily.     diclofenac Sodium (VOLTAREN) 1 % GEL Apply 2 g topically 4 (four) times daily as needed (shoulder pain). 2 g 0   docusate sodium (COLACE) 100 MG capsule Take 100 mg by mouth 2 (two) times daily.     famotidine (PEPCID) 20 MG tablet Take 1 tablet (20 mg total) by mouth daily.     feeding supplement (ENSURE ENLIVE / ENSURE PLUS) LIQD Take 237 mLs by mouth 3 (three) times daily between meals. 237 mL 12   ferrous sulfate 324 MG TBEC Take 324 mg by mouth daily. As needed     fish oil-omega-3 fatty acids 1000 MG capsule Take 1,000 mg by mouth daily.      fluorometholone (FML) 0.1 % ophthalmic suspension Place 1 drop into the right eye at bedtime.      folic  acid (FOLVITE) 1 MG tablet Take 1 mg by mouth daily.     L-Methylfolate-B12-B6-B2 (METAFOLBIC) 04-28-49-5 MG TABS TAKE ONE TABLET TWICE DAILY (Patient taking differently: Take 1 tablet by mouth 2 (two) times daily.) 60 tablet 2   linaclotide (LINZESS) 72 MCG capsule Take 72 mcg by mouth daily before breakfast.     LORazepam (ATIVAN) 0.5 MG tablet Take 0.5 mg by mouth daily.     magnesium hydroxide (MILK OF MAGNESIA)  400 MG/5ML suspension Take by mouth daily as needed for mild constipation.     melatonin 3 MG TABS tablet Take 1 tablet (3 mg total) by mouth at bedtime. (Patient taking differently: Take 5 mg by mouth as needed.)  0   Multiple Vitamins-Minerals (MULTIVITAMIN PO) Take 1 tablet by mouth daily.      pantoprazole (PROTONIX) 40 MG tablet Take 1 tablet (40 mg total) by mouth 2 (two) times daily for 30 days, THEN 1 tablet (40 mg total) daily. 420 tablet 0   predniSONE (DELTASONE) 20 MG tablet Take 2 tablets (40 mg total) by mouth daily with breakfast. (Patient taking differently: Take 40 mg by mouth daily with breakfast. 20mg  daily)     senna-docusate (SENOKOT-S) 8.6-50 MG tablet Take 1 tablet by mouth 2 (two) times daily.     sulfamethoxazole-trimethoprim (BACTRIM DS) 800-160 MG tablet Take 1 tablet by mouth 3 (three) times a week. Monday, Wednesday, Friday (Patient taking differently: Take 1 tablet by mouth 3 (three) times a week. Monday, Wednesday, Friday for PJP prophylaxis)     tamsulosin (FLOMAX) 0.4 MG CAPS capsule Take 0.4 mg by mouth at bedtime.     Tart Cherry 1200 MG CAPS Take 1 tablet by mouth daily.     traMADol (ULTRAM) 50 MG tablet Take 50 mg by mouth every 4 (four) hours as needed for moderate pain or severe pain.     traZODone (DESYREL) 50 MG tablet Take 50 mg by mouth as needed.     acyclovir (ZOVIRAX) 400 MG tablet Take 400 mg by mouth 2 (two) times daily.     dicyclomine (BENTYL) 10 MG/5ML solution Take 5 mLs (10 mg total) by mouth 3 (three) times daily as needed (reflux or  epigastric pain). 240 mL 12   DOCOSAHEXAENOIC ACID PO Take 1 g by mouth daily. (Patient not taking: Reported on 04/04/2023)     hydrOXYzine (ATARAX) 50 MG tablet Take 1 tablet (50 mg total) by mouth every 6 (six) hours as needed for anxiety. (Patient not taking: Reported on 04/28/2023) 10 tablet 0   L-METHYLFOLATE CALCIUM PO Take 1 mg by mouth 2 (two) times daily. (Patient not taking: Reported on 04/28/2023)     Mirtazapine (REMERON PO) Take by mouth.     QUEtiapine (SEROQUEL) 25 MG tablet Take 0.5 tablets (12.5 mg total) by mouth daily. (Patient not taking: Reported on 04/28/2023)     QUEtiapine (SEROQUEL) 50 MG tablet Take 1 tablet (50 mg total) by mouth at bedtime. (Patient not taking: Reported on 04/28/2023)     No facility-administered medications prior to visit.    ROS    Objective:   Vitals:   05/01/23 0948  BP: 116/72  Pulse: 62  SpO2: 95%     Physical Exam    CBC    Component Value Date/Time   WBC 22.3 (H) 03/23/2023 0315   RBC 3.09 (L) 03/23/2023 0315   HGB 9.8 (L) 03/23/2023 0315   HCT 28.8 (L) 03/23/2023 0315   PLT 338 03/23/2023 0315   MCV 93.2 03/23/2023 0315   MCH 31.7 03/23/2023 0315   MCHC 34.0 03/23/2023 0315   RDW 15.9 (H) 03/23/2023 0315   RDW 14.1 02/14/2014 0821   LYMPHSABS 0.8 03/23/2023 0315   LYMPHSABS 1.6 02/14/2014 0821   MONOABS 1.2 (H) 03/23/2023 0315   EOSABS 0.0 03/23/2023 0315   EOSABS 0.2 02/14/2014 0821   BASOSABS 0.0 03/23/2023 0315   BASOSABS 0.0 02/14/2014 0821      Latest Ref Rng &  Units 03/23/2023    3:15 AM 03/22/2023   10:00 AM 03/19/2023   11:30 AM  BMP  Glucose 70 - 99 mg/dL 95  98  409   BUN 8 - 23 mg/dL 29  29  34   Creatinine 0.61 - 1.24 mg/dL 8.11  9.14  7.82   Sodium 135 - 145 mmol/L 135  137  139   Potassium 3.5 - 5.1 mmol/L 4.1  3.7  4.2   Chloride 98 - 111 mmol/L 98  100  101   CO2 22 - 32 mmol/L 28  30  29    Calcium 8.9 - 10.3 mg/dL 8.3  8.4  8.5     Chest imaging: CXR 03/20/23 ortable AP semi upright view  at 0633 hours. Stable right PICC line. Stable somewhat low lung volumes. Stable cardiac size and mediastinal contours. Widespread abnormal course and confluent pulmonary opacity in both lungs, although peripheral right lung ventilation has improved. Left lung ventilation not significantly changed. Most of this opacity is new from February radiographs. No pneumothorax. No definite pleural effusion.  CTA Chest 03/05/23 1. Acute pulmonary embolism arising at the distal right main pulmonary artery and extending into right middle and lower pulmonary arteries. CT evidence of right heart strain (RV/LV Ratio = 1.14) consistent with at least submassive (intermediate risk) PE. The presence of right heart strain has been associated with an increased risk of morbidity and mortality. Please refer to the "Code PE Focused" order set in EPIC.   2. Pulmonary parenchymal findings compatible with multifocal pneumonia superimposed on a background of interstitial lung disease.   3.  Small bilateral pleural effusions.   4. Unchanged aneurysmal dilation of the ascending, transverse, and descending thoracic aorta with maximum diameter of 51 mm in the proximal descending aorta.   PFT:     No data to display          Labs:  Path:  Echo:  Heart Catheterization:       Assessment & Plan:   No diagnosis found.  Discussion: ***    Current Outpatient Medications:    acetaminophen (TYLENOL) 325 MG tablet, Take 2 tablets (650 mg total) by mouth every 6 (six) hours as needed for mild pain (or Fever >/= 101)., Disp: , Rfl:    apixaban (ELIQUIS) 5 MG TABS tablet, Take 1 tablet (5 mg total) by mouth 2 (two) times daily., Disp: 60 tablet, Rfl: 1   aspirin 81 MG chewable tablet, Chew 1 tablet (81 mg total) by mouth daily., Disp: , Rfl:    bisacodyl 5 MG EC tablet, Take 10 mg by mouth daily as needed for moderate constipation., Disp: , Rfl:    butalbital-acetaminophen-caffeine (FIORICET) 50-325-40 MG  tablet, Take 1 tablet by mouth daily as needed for headache., Disp: 14 tablet, Rfl: 0   cholecalciferol (VITAMIN D3) 25 MCG (1000 UNIT) tablet, Take 1,000 Units by mouth daily., Disp: , Rfl:    diclofenac Sodium (VOLTAREN) 1 % GEL, Apply 2 g topically 4 (four) times daily as needed (shoulder pain)., Disp: 2 g, Rfl: 0   docusate sodium (COLACE) 100 MG capsule, Take 100 mg by mouth 2 (two) times daily., Disp: , Rfl:    famotidine (PEPCID) 20 MG tablet, Take 1 tablet (20 mg total) by mouth daily., Disp: , Rfl:    feeding supplement (ENSURE ENLIVE / ENSURE PLUS) LIQD, Take 237 mLs by mouth 3 (three) times daily between meals., Disp: 237 mL, Rfl: 12   ferrous sulfate 324 MG TBEC, Take  324 mg by mouth daily. As needed, Disp: , Rfl:    fish oil-omega-3 fatty acids 1000 MG capsule, Take 1,000 mg by mouth daily. , Disp: , Rfl:    fluorometholone (FML) 0.1 % ophthalmic suspension, Place 1 drop into the right eye at bedtime. , Disp: , Rfl:    folic acid (FOLVITE) 1 MG tablet, Take 1 mg by mouth daily., Disp: , Rfl:    L-Methylfolate-B12-B6-B2 (METAFOLBIC) 04-28-49-5 MG TABS, TAKE ONE TABLET TWICE DAILY (Patient taking differently: Take 1 tablet by mouth 2 (two) times daily.), Disp: 60 tablet, Rfl: 2   linaclotide (LINZESS) 72 MCG capsule, Take 72 mcg by mouth daily before breakfast., Disp: , Rfl:    LORazepam (ATIVAN) 0.5 MG tablet, Take 0.5 mg by mouth daily., Disp: , Rfl:    magnesium hydroxide (MILK OF MAGNESIA) 400 MG/5ML suspension, Take by mouth daily as needed for mild constipation., Disp: , Rfl:    melatonin 3 MG TABS tablet, Take 1 tablet (3 mg total) by mouth at bedtime. (Patient taking differently: Take 5 mg by mouth as needed.), Disp: , Rfl: 0   Multiple Vitamins-Minerals (MULTIVITAMIN PO), Take 1 tablet by mouth daily. , Disp: , Rfl:    pantoprazole (PROTONIX) 40 MG tablet, Take 1 tablet (40 mg total) by mouth 2 (two) times daily for 30 days, THEN 1 tablet (40 mg total) daily., Disp: 420 tablet, Rfl:  0   predniSONE (DELTASONE) 20 MG tablet, Take 2 tablets (40 mg total) by mouth daily with breakfast. (Patient taking differently: Take 40 mg by mouth daily with breakfast. 20mg  daily), Disp: , Rfl:    senna-docusate (SENOKOT-S) 8.6-50 MG tablet, Take 1 tablet by mouth 2 (two) times daily., Disp: , Rfl:    sulfamethoxazole-trimethoprim (BACTRIM DS) 800-160 MG tablet, Take 1 tablet by mouth 3 (three) times a week. Monday, Wednesday, Friday (Patient taking differently: Take 1 tablet by mouth 3 (three) times a week. Monday, Wednesday, Friday for PJP prophylaxis), Disp: , Rfl:    tamsulosin (FLOMAX) 0.4 MG CAPS capsule, Take 0.4 mg by mouth at bedtime., Disp: , Rfl:    Tart Cherry 1200 MG CAPS, Take 1 tablet by mouth daily., Disp: , Rfl:    traMADol (ULTRAM) 50 MG tablet, Take 50 mg by mouth every 4 (four) hours as needed for moderate pain or severe pain., Disp: , Rfl:    traZODone (DESYREL) 50 MG tablet, Take 50 mg by mouth as needed., Disp: , Rfl:

## 2023-05-02 DIAGNOSIS — I480 Paroxysmal atrial fibrillation: Secondary | ICD-10-CM

## 2023-05-08 ENCOUNTER — Encounter: Payer: Self-pay | Admitting: Family Medicine

## 2023-05-08 ENCOUNTER — Non-Acute Institutional Stay: Payer: Medicare Other | Admitting: Family Medicine

## 2023-05-08 VITALS — BP 98/72

## 2023-05-08 DIAGNOSIS — D729 Disorder of white blood cells, unspecified: Secondary | ICD-10-CM | POA: Insufficient documentation

## 2023-05-08 DIAGNOSIS — J8281 Chronic eosinophilic pneumonia: Secondary | ICD-10-CM

## 2023-05-08 DIAGNOSIS — R109 Unspecified abdominal pain: Secondary | ICD-10-CM | POA: Diagnosis not present

## 2023-05-08 DIAGNOSIS — D75839 Thrombocytosis, unspecified: Secondary | ICD-10-CM | POA: Insufficient documentation

## 2023-05-08 DIAGNOSIS — K5909 Other constipation: Secondary | ICD-10-CM | POA: Insufficient documentation

## 2023-05-08 DIAGNOSIS — D72828 Other elevated white blood cell count: Secondary | ICD-10-CM | POA: Insufficient documentation

## 2023-05-08 DIAGNOSIS — J9601 Acute respiratory failure with hypoxia: Secondary | ICD-10-CM

## 2023-05-08 DIAGNOSIS — D472 Monoclonal gammopathy: Secondary | ICD-10-CM | POA: Insufficient documentation

## 2023-05-08 DIAGNOSIS — Z806 Family history of leukemia: Secondary | ICD-10-CM | POA: Insufficient documentation

## 2023-05-08 DIAGNOSIS — R140 Abdominal distension (gaseous): Secondary | ICD-10-CM | POA: Diagnosis not present

## 2023-05-08 DIAGNOSIS — D649 Anemia, unspecified: Secondary | ICD-10-CM | POA: Insufficient documentation

## 2023-05-08 DIAGNOSIS — F320 Major depressive disorder, single episode, mild: Secondary | ICD-10-CM | POA: Diagnosis not present

## 2023-05-08 NOTE — Progress Notes (Signed)
Therapist, nutritional Palliative Care Consult Note Telephone: [phone]  Fax: [phone]   Date of encounter: 05/08/23 4:45 PM PATIENT NAME: [name] [address] [place] [address]   [phone] (home)  DOB: [birth year] MRN: [id number] PRIMARY CARE PROVIDER:    [name], MD,  [address] [place] [address] [phone]  REFERRING PROVIDER:   [name], MD [address] [place],  [place] [address] [phone]  Emergency Contact:    Contact Information     Name Relation Home Work Colome Spouse [phone]  [phone]   Gunter, Goodner   [phone]       Health Care Donato Schultz   I met face to face with patient and wife in [facility]. Palliative Care was asked to follow this patient by consultation request of [name], MD/Dr Encarnacion Slates to address advance care planning and complex medical decision making. This is a follow up visit.  ADVANCE CARE PLANNING/GOALS OF CARE:  PRESENT FOR DISCUSSION: patient, spouse Darl Pikes and Provider Joycelyn Man FNP-C  GOALS:  to improve constipation and return home with some independence  BARRIERS: Significant anemia and debilitation, Pt with depression, constipation   Our advance care planning conversation included a discussion about:    Has a living will on file   CODE STATUS: DNR    ASSESSMENT AND / RECOMMENDATIONS:  PPS: 50%  Normocytic anemia Discussed possible issue with hemolysis but pt has had normal LDH. Possible GI bleed-will order CT angio abdomen/pelvis with/without to evaluate for masses/bleeding ASAP at [facility]. Possible bone marrow problem-peripheral smear when out to the hospital. Copy results to Hem[facility]onc at [facility].  Obtain BMP.   IgM MUGUS/Fam hx of leukemia Question if alteration in bowel function/leukocytosis related or possible bone marrow process which is contributing to leukocytosis and  decreasing hgb/rbc volume. Peripheral smear Brother with CLL   Constipation Pt with rectal pressure/discomfort and abnormal FDG uptake in medial prostate on NM Pet CT in March. CT as per above.  Obtain PSA Agree with sennosides 2 po BID, Miralax 17 gm po BID with prn Dulcolax if ineffective   Neutrophilic leukocytosis/thrombocytosis Question if related to bone marrow process versus inflammation.  Obtain CBC, sed rate and CRP.   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 Reducing O2 requirement with increased independence.  Follow up Palliative Care Visit:  Palliative Care continuing to follow up by monitoring for changes in appetite, weight, functional and cognitive status for chronic disease progression and management in agreement with patient's stated goals of care. Next visit in 1-2 weeks or prn.  This visit was coded based on medical decision making (MDM).  Chief Complaint  Acute visit at request of wife to assess current status and readiness for Hospice level of care.   HISTORY OF PRESENT ILLNESS: [name] is a 89 y.o. year old male with acute respiratory failure with eosinophilic pneumonia. He has a hx of Daptomycin induced pneumonia and Polymyalgia Rheumatica.  He has experienced sepsis and was d/c'd to SNF to rehab appropriately.  He has been having recent increased complaints of constipation, rectal pressure and discomfort when sitting.  He seems to feel better after being given a cocktail of MOM/Miralax and Senna with large loose Bms that are almost uncontrollable.  He also expresses relief of discomfort from digital rectal stimulation like with disimpaction with large amounts of stool. He is not aware of any blood in stool but has not attempted to check.  Duanne Guess NP for [facility] indicated that pt has had negative FOBT since his profound anemia was discovered. He does have a hx of positive cologuard colon cancer screening with hx of abnormal bowel  habits for which he had a colonoscopy in 2019.  He had 5 tubular adenomatous polyps which were not high grade for dysplasia at all different levels of the colon.  He underwent PET CT which showed some enlargement of the prostate median lobe of uncertain significance. He has longstanding PMR followed by a Rheumatologist and has also seen Hematology/Oncology (Megan Lacretia Nicks, [place]) at [facility] around [place] of this year.  He was noted to have significant elevations in sed rate and CRP with increase to high 20,000s.  Since his return to SNF his WBC has been steadily decreasing to 14.9 at his lowest 2 weeks ago. He had an M spike in his electrophoresis and was diagnosed with IgM MGUS.  His older brother who is 41 or 42 has chronic lymphocytic leukemia. He reports not having much of an appetite and per facility staff wife has not been eating really well nor drinking well.  Facility staff indicates pt has requested "a pill" that would end his life previously.  He is being followed by a nurse psychologist.  He had been on Luvox and this was changed to Remeron with recent increase to 15 mg daily.  He declined to work with PT this morning but was feeling much improved this afternoon and was able to go down and ride an exercise bike in the therapy room. Nursing states he is eating approximately 30-50% of most meals. He expressed significant frustration with not having answers to his condition and to his chronic constipation.  He did have a stat KUB done today which at time of this note were not available.  Pt states his goals are to return home with some level of independence.  Wife states he was told to have his O2 sats >92% so his O2 was decreased to 3L [place]. He is sitting up in the wheelchair, allowing his wife to push him around the facility and he is dressed completely. He ate approximately 15% of his dinner. For the last 2 nights he has had an increase in cough.Denies fever, worsening SOB or  nausea.   ACTIVITIES OF DAILY LIVING: CONTINENT OF BLADDER/BOWEL? Y/N BATHING/DRESSING/FEEDING: Requires assistance with bathing and dressing hard to reach places, Independent with feeding   MOBILITY:   WHEELCHAIR Has walked 80 feet with RW with PT  APPETITE? Fair (30-50% of most meals) WEIGHT: 141 lbs down from 143 lbs  CURRENT PROBLEM LIST:  Patient Active Problem List   Diagnosis Date Noted   Adjustment disorder with mixed anxiety and depressed mood 04/21/2023   Severe protein-calorie malnutrition (HCC) 04/04/2023   Delirium 03/14/2023   Acute eosinophilic pneumonia 03/14/2023   Acute metabolic encephalopathy 03/13/2023   Paroxysmal A-fib (HCC) 03/13/2023   Pressure injury of skin 03/13/2023   PAF (paroxysmal atrial fibrillation) (HCC) 03/08/2023   Drug-induced insomnia (HCC) 03/07/2023   Anxiety 03/07/2023   DVT, lower extremity, distal, acute, right (HCC) 03/07/2023   Pulmonary embolism (HCC) 03/06/2023   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   Aortitis (HCC) 03/06/2023   Atrial flutter (HCC) 03/06/2023   FUO (fever of unknown origin) 03/06/2023   S/P insertion of endovascular thoracic aortic stent graft 03/06/2023   Acute pulmonary embolism (HCC) 03/05/2023   Acute respiratory failure with hypoxia (HCC) 03/05/2023   Acute hyponatremia 03/05/2023   Elevated  troponin 03/05/2023   Lactic acidosis 03/05/2023   HCAP (healthcare-associated pneumonia) 03/05/2023   Chronic diastolic CHF (congestive heart failure) (HCC) 03/05/2023   BPH (benign prostatic hyperplasia) 03/05/2023   Fever 01/24/2023   S/P AAA repair 08/16/2017   Generalized weakness 04/03/2017   Abdominal pain, RUQ 03/28/2017   Sinusitis 03/14/2017   Skin tear of hand without complication, initial encounter 01/11/2017   Abrasion, multiple sites 01/11/2017   Mass of left hand 10/19/2016   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   AAA (abdominal aortic aneurysm) without rupture (HCC) 01/26/2016   Dupuytren's  contracture of left hand 08/26/2014   Contracture of joint of finger of left hand 08/26/2014   Branch retinal vein occlusion 05/27/2014   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   Special screening for malignant neoplasm of prostate 02/18/2014   Urinary frequency 02/18/2014   Incomplete bladder emptying 02/18/2014   AAA (abdominal aortic aneurysm) (HCC) 01/07/2014   Hyperlipidemia 08/20/2013   PMR (polymyalgia rheumatica) (HCC) 08/20/2013   Unspecified vitamin D deficiency 08/20/2013   Impotence of organic origin 08/20/2013   Floater, vitreous 10/19/2012   Cerebrovascular disease 09/28/2012   Cataract, nuclear 03/14/2012   Corneal macula not interfering with central vision 03/14/2012   COLONIC POLYPS, HX OF 03/16/2010   PAST MEDICAL HISTORY:  Active Ambulatory Problems    Diagnosis Date Noted   COLONIC POLYPS, HX OF 03/16/2010   Hyperlipidemia 08/20/2013   PMR (polymyalgia rheumatica) (HCC) 08/20/2013   Unspecified vitamin D deficiency 08/20/2013   Impotence of organic origin 08/20/2013   AAA (abdominal aortic aneurysm) (HCC) 01/07/2014   Special screening for malignant neoplasm of prostate 02/18/2014   Urinary frequency 02/18/2014   Incomplete bladder emptying 02/18/2014   Dupuytren's contracture of left hand 08/26/2014   Contracture of joint of finger of left hand 08/26/2014   AAA (abdominal aortic aneurysm) without rupture (HCC) 01/26/2016   Branch retinal vein occlusion 05/27/2014   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   Cataract, nuclear 03/14/2012   Corneal macula not interfering with central vision 03/14/2012   Floater, vitreous 10/19/2012   Cerebrovascular disease 09/28/2012   Skin tear of hand without complication, initial encounter 01/11/2017   Abrasion, multiple sites 01/11/2017   Mass of left hand 10/19/2016   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   Sinusitis 03/14/2017   Abdominal pain, RUQ 03/28/2017   Generalized weakness 04/03/2017   S/P AAA repair 08/16/2017    Fever 01/24/2023   Acute pulmonary embolism (HCC) 03/05/2023   Acute respiratory failure with hypoxia (HCC) 03/05/2023   Acute hyponatremia 03/05/2023   Elevated troponin 03/05/2023   Lactic acidosis 03/05/2023   HCAP (healthcare-associated pneumonia) 03/05/2023   Chronic diastolic CHF (congestive heart failure) (HCC) 03/05/2023   BPH (benign prostatic hyperplasia) 03/05/2023   Pulmonary embolism (HCC) 03/06/2023   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   Aortitis (HCC) 03/06/2023   Atrial flutter (HCC) 03/06/2023   FUO (fever of unknown origin) 03/06/2023   S/P insertion of endovascular thoracic aortic stent graft 03/06/2023   Drug-induced insomnia (HCC) 03/07/2023   Anxiety 03/07/2023   DVT, lower extremity, distal, acute, right (HCC) 03/07/2023   PAF (paroxysmal atrial fibrillation) (HCC) 03/08/2023   Acute metabolic encephalopathy 03/13/2023   Paroxysmal A-fib (HCC) 03/13/2023   Pressure injury of skin 03/13/2023   Delirium 03/14/2023   Acute eosinophilic pneumonia 03/14/2023   Severe protein-calorie malnutrition (HCC) 04/04/2023   Adjustment disorder with mixed anxiety and depressed mood 04/21/2023   Resolved Ambulatory Problems    Diagnosis Date Noted   Abdominal aneurysm without mention of  rupture 01/01/2013   Impacted cerumen 04/09/2014   Past Medical History:  Diagnosis Date   Abnormal EKG    Allergic rhinitis    Allergy    Benign neoplasm of colon 04/14/10   Carpal tunnel syndrome    Cataract    Degenerative disc disease    Disturbances metabolism of methionine, homocystine, and cystathionine (HCC)    Elevated prostate specific antigen (PSA)    Hearing loss    Internal hemorrhoids    Neck pain    Otosclerosis of both ears    Peripheral vascular disease (HCC)    Plantar fasciitis    Polymyalgia rheumatica (HCC)    Rotator cuff syndrome of left shoulder    Scoliosis    Shoulder pain    SOCIAL HX:  Social History   Tobacco Use   Smoking status: Never    Smokeless tobacco: Never  Substance Use Topics   Alcohol use: No    Alcohol/week: 0.0 standard drinks of alcohol   FAMILY HX:  Family History  Problem Relation Age of Onset   Heart disease Mother    Emphysema Mother    Leukemia Brother    Colon cancer Neg Hx    Esophageal cancer Neg Hx    Rectal cancer Neg Hx    Stomach cancer Neg Hx        Preferred Pharmacy: ALLERGIES:  Allergies  Allergen Reactions   Rosuvastatin Other (See Comments)    Stopped taking due to feeling achy     Codeine Nausea And Vomiting   Morphine And Codeine Nausea And Vomiting     PERTINENT MEDICATIONS:  Outpatient Encounter Medications as of 05/08/2023  Medication Sig   acetaminophen (TYLENOL) 325 MG tablet Take 2 tablets (650 mg total) by mouth every 6 (six) hours as needed for mild pain (or Fever >/= 101).   apixaban (ELIQUIS) 5 MG TABS tablet Take 1 tablet (5 mg total) by mouth 2 (two) times daily.   aspirin 81 MG chewable tablet Chew 1 tablet (81 mg total) by mouth daily.   bisacodyl 5 MG EC tablet Take 10 mg by mouth daily as needed for moderate constipation.   butalbital-acetaminophen-caffeine (FIORICET) 50-325-40 MG tablet Take 1 tablet by mouth daily as needed for headache.   cholecalciferol (VITAMIN D3) 25 MCG (1000 UNIT) tablet Take 1,000 Units by mouth daily.   diclofenac Sodium (VOLTAREN) 1 % GEL Apply 2 g topically 4 (four) times daily as needed (shoulder pain).   docusate sodium (COLACE) 100 MG capsule Take 100 mg by mouth 2 (two) times daily.   famotidine (PEPCID) 20 MG tablet Take 1 tablet (20 mg total) by mouth daily.   feeding supplement (ENSURE ENLIVE / ENSURE PLUS) LIQD Take 237 mLs by mouth 3 (three) times daily between meals.   ferrous sulfate 324 MG TBEC Take 324 mg by mouth daily. As needed   fish oil-omega-3 fatty acids 1000 MG capsule Take 1,000 mg by mouth daily.    fluorometholone (FML) 0.1 % ophthalmic suspension Place 1 drop into the right eye at bedtime.    folic acid  (FOLVITE) 1 MG tablet Take 1 mg by mouth daily.   L-Methylfolate-B12-B6-B2 (METAFOLBIC) 04-28-49-5 MG TABS TAKE ONE TABLET TWICE DAILY (Patient taking differently: Take 1 tablet by mouth 2 (two) times daily.)   linaclotide (LINZESS) 72 MCG capsule Take 72 mcg by mouth daily before breakfast.   LORazepam (ATIVAN) 0.5 MG tablet Take 0.5 mg by mouth daily.   magnesium hydroxide (MILK OF MAGNESIA) 400  MG/5ML suspension Take by mouth daily as needed for mild constipation.   melatonin 3 MG TABS tablet Take 1 tablet (3 mg total) by mouth at bedtime. (Patient taking differently: Take 5 mg by mouth as needed.)   Multiple Vitamins-Minerals (MULTIVITAMIN PO) Take 1 tablet by mouth daily.    pantoprazole (PROTONIX) 40 MG tablet Take 1 tablet (40 mg total) by mouth 2 (two) times daily for 30 days, THEN 1 tablet (40 mg total) daily.   predniSONE (DELTASONE) 20 MG tablet Take 2 tablets (40 mg total) by mouth daily with breakfast. (Patient taking differently: Take 40 mg by mouth daily with breakfast. 20mg  daily)   senna-docusate (SENOKOT-S) 8.6-50 MG tablet Take 1 tablet by mouth 2 (two) times daily.   sulfamethoxazole-trimethoprim (BACTRIM DS) 800-160 MG tablet Take 1 tablet by mouth 3 (three) times a week. Monday, Wednesday, Friday (Patient taking differently: Take 1 tablet by mouth 3 (three) times a week. Monday, Wednesday, Friday for PJP prophylaxis)   tamsulosin (FLOMAX) 0.4 MG CAPS capsule Take 0.4 mg by mouth at bedtime.   Tart Cherry 1200 MG CAPS Take 1 tablet by mouth daily.   traMADol (ULTRAM) 50 MG tablet Take 50 mg by mouth every 4 (four) hours as needed for moderate pain or severe pain.   traZODone (DESYREL) 50 MG tablet Take 50 mg by mouth as needed.   No facility-administered encounter medications on file as of 05/08/2023.    History obtained from review of EMR, discussion with primary team, and interview with family, facility staff/caregiver and/or patient.   Outside labs for [facility]  collected 05/05/23 compared to [facility] 04/24/23: WBC 19.7 up from 14.9 RBC 2.22 down from 2.61 HGB 6.5 down from 7.7 HCT 20.4% down from 23.8% MCV normal at 91.9 up from 91.2 Plt elevated at 563 up from 325  CBC    Component Value Date/Time   WBC 22.3 (H) 03/23/2023 0315   RBC 3.09 (L) 03/23/2023 0315   HGB 9.8 (L) 03/23/2023 0315   HCT 28.8 (L) 03/23/2023 0315   PLT 338 03/23/2023 0315   MCV 93.2 03/23/2023 0315   MCH 31.7 03/23/2023 0315   MCHC 34.0 03/23/2023 0315   RDW 15.9 (H) 03/23/2023 0315   RDW 14.1 02/14/2014 0821   LYMPHSABS 0.8 03/23/2023 0315   LYMPHSABS 1.6 02/14/2014 0821   MONOABS 1.2 (H) 03/23/2023 0315   EOSABS 0.0 03/23/2023 0315   EOSABS 0.2 02/14/2014 0821   BASOSABS 0.0 03/23/2023 0315   BASOSABS 0.0 02/14/2014 0821    CMP    Latest Ref Rng & Units 03/23/2023    3:15 AM 03/22/2023   10:00 AM 03/19/2023   11:30 AM  CMP  Glucose 70 - 99 mg/dL 95  98  161   BUN 8 - 23 mg/dL 29  29  34   Creatinine 0.61 - 1.24 mg/dL 0.96  0.45  4.09   Sodium 135 - 145 mmol/L 135  137  139   Potassium 3.5 - 5.1 mmol/L 4.1  3.7  4.2   Chloride 98 - 111 mmol/L 98  100  101   CO2 22 - 32 mmol/L 28  30  29    Calcium 8.9 - 10.3 mg/dL 8.3  8.4  8.5     LFTs    Latest Ref Rng & Units 03/14/2023    4:17 AM 03/11/2023    4:19 AM 03/09/2023    1:43 AM  Hepatic Function  Total Protein 6.5 - 8.1 g/dL  5.6  5.6  Albumin 3.5 - 5.0 g/dL 2.2  2.5  1.8   AST 15 - 41 U/L  23  60   ALT 0 - 44 U/L  39  63   Alk Phosphatase 38 - 126 U/L  78  81   Total Bilirubin 0.3 - 1.2 mg/dL  0.8  0.4   Bilirubin, Direct 0.0 - 0.2 mg/dL  0.2      Urinalysis    Component Value Date/Time   COLORURINE AMBER (A) 03/05/2023 1845   APPEARANCEUR CLEAR 03/05/2023 1845   LABSPEC 1.017 03/05/2023 1845   PHURINE 6.0 03/05/2023 1845   GLUCOSEU NEGATIVE 03/05/2023 1845   HGBUR SMALL (A) 03/05/2023 1845   BILIRUBINUR NEGATIVE 03/05/2023 1845   KETONESUR NEGATIVE 03/05/2023 1845   PROTEINUR  100 (A) 03/05/2023 1845   NITRITE NEGATIVE 03/05/2023 1845   LEUKOCYTESUR NEGATIVE 03/05/2023 1845   02/09/23 NM PET CT skull base to thighs:  Sequelae of infrarenal aortobiiliac endovascular stent graft with abnormal nodular FDG uptake around the superficial excluded aneurysm sac, for which the differential diagnosis includes inflammatory vasculitis versus infectious aortitis. -No additional sites involvement of the large vessels.  -Aneurysmal dilation of the thoracic aorta with the ascending and superior descending thoracic aorta measuring up to 4.5 and 5.0 cm respectively. Recommend correlation with prior outside to evaluate for stability. Ascending aortic dilation may be poststenotic in etiology given calcification of aortic valve.  -Focal uptake associated with the mid right posterolateral prostate for which prostate cancer cannot be excluded. Recommend correlation with PSA and nonemergent prostate MRI for further characterization as clinically indicated.   Last PSA 9/25/225 was 4.0  BNP (last 3 results) Recent Labs    01/25/23 0345 03/05/23 1614  BNP 91.7 121.7*   09/28/2018 Positive colorectal cancer screening using cologuard test, abnormal feces and personal hx of colonic polyps. Colonoscopy path: Transverse, cecal and ascending polyps: tubular adenomas with no high grade dysplasia identified. Ascending and sigmoid polyps- tubular adenomas with no high grade dysplasia identified.  I reviewed available labs, medications, imaging, studies and related documents from the EMR.  There were no new records/imaging since last visit. Records reviewed and summarized above.   Physical Exam: GENERAL: NAD LUNGS: CTAB, no increased work of breathing, room air CARDIAC:  S1S2, RRR with no MRG, Y/N edema, Y/N cyanosis ABD:  Normo-active BS x 4 quads, soft, non-tender EXTREMITIES: Normal ROM, no deformity, strength equal, Y/N muscle atrophy/subcutaneous fat loss NEURO:  No weakness or cognitive  impairment, aphasia-expressive/receptive  PSYCH:  non-anxious affect, A & O x 3  Thank you for the opportunity to participate in the care [email or web address]@. Please call our main office at [phone] if we can be of additional assistance.    Joycelyn Man FNP-C  [email or web address] .[facility]  Phone:  [phone]

## 2023-05-09 ENCOUNTER — Other Ambulatory Visit (HOSPITAL_BASED_OUTPATIENT_CLINIC_OR_DEPARTMENT_OTHER): Payer: Self-pay | Admitting: Family Medicine

## 2023-05-11 ENCOUNTER — Ambulatory Visit (HOSPITAL_BASED_OUTPATIENT_CLINIC_OR_DEPARTMENT_OTHER)
Admission: RE | Admit: 2023-05-11 | Discharge: 2023-05-11 | Disposition: A | Discharge status: Home / Self Care | Payer: Medicare Other | Source: Ambulatory Visit | Attending: Family Medicine | Admitting: Family Medicine

## 2023-05-11 DIAGNOSIS — N281 Cyst of kidney, acquired: Secondary | ICD-10-CM | POA: Diagnosis not present

## 2023-05-11 DIAGNOSIS — N329 Bladder disorder, unspecified: Secondary | ICD-10-CM | POA: Diagnosis not present

## 2023-05-11 DIAGNOSIS — J9811 Atelectasis: Secondary | ICD-10-CM | POA: Diagnosis not present

## 2023-05-11 DIAGNOSIS — K5900 Constipation, unspecified: Secondary | ICD-10-CM | POA: Diagnosis not present

## 2023-05-11 MED ORDER — IOHEXOL 350 MG/ML SOLN
100.0000 mL | Freq: Once | INTRAVENOUS | Status: AC | PRN
  Administered 2023-05-11: 75 mL via INTRAVENOUS

## 2023-05-12 ENCOUNTER — Non-Acute Institutional Stay: Payer: Medicare Other | Admitting: Family Medicine

## 2023-05-12 DIAGNOSIS — D649 Anemia, unspecified: Secondary | ICD-10-CM

## 2023-05-12 DIAGNOSIS — N400 Enlarged prostate without lower urinary tract symptoms: Secondary | ICD-10-CM

## 2023-05-12 DIAGNOSIS — Z806 Family history of leukemia: Secondary | ICD-10-CM

## 2023-05-12 DIAGNOSIS — K5909 Other constipation: Secondary | ICD-10-CM | POA: Diagnosis not present

## 2023-05-12 DIAGNOSIS — R339 Retention of urine, unspecified: Secondary | ICD-10-CM

## 2023-05-12 NOTE — Progress Notes (Signed)
Patient ID: [name], male   DOB: 19-Jan-1930, 89 y.o.   MRN: [id number]  Chief c/o: Follow up of abnormal labs, constipation, anemia with hx of abnormal SPEP and family hx of leukemia.  HPI: Pt has had issues with c/o feeling consistent rectal pressure with only some relief with digital rectal stimulation in passing some flatus and manual disimpaction of stool. He has continued to have some drifting of his HGB with negative FOBT.  Given his recent eosinophilic pneumonia pt was not a good candidate to be put to sleep.  CT angio abd/pelvis with and without contrast was ordered.  CT showed previously known abd aortic aneurysm with repair and no evidence of dissection.  CT showing fibrotic lungs with scarring.  CT showed markedly enlarged prostate with distended bladder. No hydronephrosis on CTA.  A/P:  Prostate hypertrophy Severe enlargement noted on CT scan with impingement on base of bladder. Remains on Tamsulosin 0.4 mg daily. Has seen Dr Modena Slater with [facility] previously. Awaiting results of PSA, recommend Urology consult. Likely some degree of prostate cancer but no evidence of lymph node involvement or bony involvement on scans.  2.  Constipation Has recently started on Sennosides BID with Miralax 17 gm /8 oz fluid daily. Some success noted recently with normal Bms. Encourage fluid intake.   3.  Incomplete bladder emptying No hydronephrosis noted on CTA abd/pelvis. Continue to monitor for pelvic pain/pressure, inability to void. Awaiting further results of PSA and referral to Urologist May need to place foley if unable to void.  4.  Normocytic anemia Peripheral smear is pending, will follow up with Hematology from [facility]. No evidence of active GI bleed or vascular disection of aneurysm on CTA abd/pelvis. Continue iron supplement daily.  5.  Family hx of leukemia Brother has leukemia. Pt was found to have elevated protein markers with dx of MGUS. Awaiting  results of peripheral smear  Questions answered for pt, wife and son, present.  Next steps presented to obtain outstanding labwork and discuss with pt, family and provider.  Thank you for the opportunity to participate in the care of [name]. Please call our main office at [phone] if we can be of additional assistance.    Joycelyn Man FNP-C  [facility]  Phone:  [phone]

## 2023-05-16 ENCOUNTER — Encounter: Payer: Self-pay | Admitting: Family Medicine

## 2023-05-16 DIAGNOSIS — D638 Anemia in other chronic diseases classified elsewhere: Secondary | ICD-10-CM | POA: Diagnosis not present

## 2023-05-16 DIAGNOSIS — N400 Enlarged prostate without lower urinary tract symptoms: Secondary | ICD-10-CM | POA: Insufficient documentation

## 2023-05-17 DIAGNOSIS — R3914 Feeling of incomplete bladder emptying: Secondary | ICD-10-CM | POA: Diagnosis not present

## 2023-05-17 DIAGNOSIS — R351 Nocturia: Secondary | ICD-10-CM | POA: Diagnosis not present

## 2023-05-17 DIAGNOSIS — N401 Enlarged prostate with lower urinary tract symptoms: Secondary | ICD-10-CM | POA: Diagnosis not present

## 2023-05-23 DIAGNOSIS — N390 Urinary tract infection, site not specified: Secondary | ICD-10-CM | POA: Diagnosis not present

## 2023-05-24 DIAGNOSIS — I480 Paroxysmal atrial fibrillation: Secondary | ICD-10-CM | POA: Diagnosis not present

## 2023-05-29 DIAGNOSIS — Z515 Encounter for palliative care: Secondary | ICD-10-CM | POA: Diagnosis not present

## 2023-05-31 ENCOUNTER — Ambulatory Visit: Payer: Medicare Other | Admitting: Student

## 2023-05-31 ENCOUNTER — Other Ambulatory Visit: Payer: Self-pay

## 2023-05-31 MED ORDER — METOPROLOL SUCCINATE ER 25 MG PO TB24: 25.0000 mg | ORAL_TABLET | Freq: Every day | ORAL | 3 refills | Status: DC

## 2023-06-03 DIAGNOSIS — Z7952 Long term (current) use of systemic steroids: Secondary | ICD-10-CM | POA: Diagnosis not present

## 2023-06-03 DIAGNOSIS — R350 Frequency of micturition: Secondary | ICD-10-CM | POA: Diagnosis not present

## 2023-06-03 DIAGNOSIS — I2699 Other pulmonary embolism without acute cor pulmonale: Secondary | ICD-10-CM | POA: Diagnosis not present

## 2023-06-03 DIAGNOSIS — Z7982 Long term (current) use of aspirin: Secondary | ICD-10-CM | POA: Diagnosis not present

## 2023-06-03 DIAGNOSIS — E785 Hyperlipidemia, unspecified: Secondary | ICD-10-CM | POA: Diagnosis not present

## 2023-06-03 DIAGNOSIS — I5032 Chronic diastolic (congestive) heart failure: Secondary | ICD-10-CM | POA: Diagnosis not present

## 2023-06-03 DIAGNOSIS — H01009 Unspecified blepharitis unspecified eye, unspecified eyelid: Secondary | ICD-10-CM | POA: Diagnosis not present

## 2023-06-03 DIAGNOSIS — J849 Interstitial pulmonary disease, unspecified: Secondary | ICD-10-CM | POA: Diagnosis not present

## 2023-06-03 DIAGNOSIS — M19042 Primary osteoarthritis, left hand: Secondary | ICD-10-CM | POA: Diagnosis not present

## 2023-06-03 DIAGNOSIS — E559 Vitamin D deficiency, unspecified: Secondary | ICD-10-CM | POA: Diagnosis not present

## 2023-06-03 DIAGNOSIS — M720 Palmar fascial fibromatosis [Dupuytren]: Secondary | ICD-10-CM | POA: Diagnosis not present

## 2023-06-03 DIAGNOSIS — N138 Other obstructive and reflux uropathy: Secondary | ICD-10-CM | POA: Diagnosis not present

## 2023-06-03 DIAGNOSIS — D649 Anemia, unspecified: Secondary | ICD-10-CM | POA: Diagnosis not present

## 2023-06-03 DIAGNOSIS — J8282 Acute eosinophilic pneumonia: Secondary | ICD-10-CM | POA: Diagnosis not present

## 2023-06-03 DIAGNOSIS — R338 Other retention of urine: Secondary | ICD-10-CM | POA: Diagnosis not present

## 2023-06-03 DIAGNOSIS — I82401 Acute embolism and thrombosis of unspecified deep veins of right lower extremity: Secondary | ICD-10-CM | POA: Diagnosis not present

## 2023-06-03 DIAGNOSIS — L89151 Pressure ulcer of sacral region, stage 1: Secondary | ICD-10-CM | POA: Diagnosis not present

## 2023-06-03 DIAGNOSIS — K219 Gastro-esophageal reflux disease without esophagitis: Secondary | ICD-10-CM | POA: Diagnosis not present

## 2023-06-03 DIAGNOSIS — M353 Polymyalgia rheumatica: Secondary | ICD-10-CM | POA: Diagnosis not present

## 2023-06-03 DIAGNOSIS — N401 Enlarged prostate with lower urinary tract symptoms: Secondary | ICD-10-CM | POA: Diagnosis not present

## 2023-06-03 DIAGNOSIS — G4700 Insomnia, unspecified: Secondary | ICD-10-CM | POA: Diagnosis not present

## 2023-06-03 DIAGNOSIS — I480 Paroxysmal atrial fibrillation: Secondary | ICD-10-CM | POA: Diagnosis not present

## 2023-06-03 DIAGNOSIS — H17829 Peripheral opacity of cornea, unspecified eye: Secondary | ICD-10-CM | POA: Diagnosis not present

## 2023-06-03 DIAGNOSIS — F419 Anxiety disorder, unspecified: Secondary | ICD-10-CM | POA: Diagnosis not present

## 2023-06-03 DIAGNOSIS — I4892 Unspecified atrial flutter: Secondary | ICD-10-CM | POA: Diagnosis not present

## 2023-06-05 DIAGNOSIS — I2699 Other pulmonary embolism without acute cor pulmonale: Secondary | ICD-10-CM | POA: Diagnosis not present

## 2023-06-05 DIAGNOSIS — I480 Paroxysmal atrial fibrillation: Secondary | ICD-10-CM | POA: Diagnosis not present

## 2023-06-05 DIAGNOSIS — I5032 Chronic diastolic (congestive) heart failure: Secondary | ICD-10-CM | POA: Diagnosis not present

## 2023-06-05 DIAGNOSIS — J8282 Acute eosinophilic pneumonia: Secondary | ICD-10-CM | POA: Diagnosis not present

## 2023-06-05 DIAGNOSIS — I4892 Unspecified atrial flutter: Secondary | ICD-10-CM | POA: Diagnosis not present

## 2023-06-05 DIAGNOSIS — R350 Frequency of micturition: Secondary | ICD-10-CM | POA: Diagnosis not present

## 2023-06-05 DIAGNOSIS — I82401 Acute embolism and thrombosis of unspecified deep veins of right lower extremity: Secondary | ICD-10-CM | POA: Diagnosis not present

## 2023-06-06 DIAGNOSIS — I5032 Chronic diastolic (congestive) heart failure: Secondary | ICD-10-CM | POA: Diagnosis not present

## 2023-06-06 DIAGNOSIS — D638 Anemia in other chronic diseases classified elsewhere: Secondary | ICD-10-CM | POA: Diagnosis not present

## 2023-06-06 DIAGNOSIS — R509 Fever, unspecified: Secondary | ICD-10-CM | POA: Diagnosis not present

## 2023-06-06 DIAGNOSIS — M353 Polymyalgia rheumatica: Secondary | ICD-10-CM | POA: Diagnosis not present

## 2023-06-06 DIAGNOSIS — R410 Disorientation, unspecified: Secondary | ICD-10-CM | POA: Diagnosis not present

## 2023-06-06 DIAGNOSIS — I480 Paroxysmal atrial fibrillation: Secondary | ICD-10-CM | POA: Diagnosis not present

## 2023-06-06 DIAGNOSIS — R059 Cough, unspecified: Secondary | ICD-10-CM | POA: Diagnosis not present

## 2023-06-06 DIAGNOSIS — L89151 Pressure ulcer of sacral region, stage 1: Secondary | ICD-10-CM | POA: Diagnosis not present

## 2023-06-06 DIAGNOSIS — E871 Hypo-osmolality and hyponatremia: Secondary | ICD-10-CM | POA: Diagnosis not present

## 2023-06-06 DIAGNOSIS — I4892 Unspecified atrial flutter: Secondary | ICD-10-CM | POA: Diagnosis not present

## 2023-06-06 DIAGNOSIS — J9601 Acute respiratory failure with hypoxia: Secondary | ICD-10-CM | POA: Diagnosis not present

## 2023-06-06 DIAGNOSIS — D72829 Elevated white blood cell count, unspecified: Secondary | ICD-10-CM | POA: Diagnosis not present

## 2023-06-06 DIAGNOSIS — J8282 Acute eosinophilic pneumonia: Secondary | ICD-10-CM | POA: Diagnosis not present

## 2023-06-06 DIAGNOSIS — I714 Abdominal aortic aneurysm, without rupture, unspecified: Secondary | ICD-10-CM | POA: Diagnosis not present

## 2023-06-06 DIAGNOSIS — I82401 Acute embolism and thrombosis of unspecified deep veins of right lower extremity: Secondary | ICD-10-CM | POA: Diagnosis not present

## 2023-06-06 DIAGNOSIS — I2699 Other pulmonary embolism without acute cor pulmonale: Secondary | ICD-10-CM | POA: Diagnosis not present

## 2023-06-08 DIAGNOSIS — I480 Paroxysmal atrial fibrillation: Secondary | ICD-10-CM | POA: Diagnosis not present

## 2023-06-08 DIAGNOSIS — I82401 Acute embolism and thrombosis of unspecified deep veins of right lower extremity: Secondary | ICD-10-CM | POA: Diagnosis not present

## 2023-06-08 DIAGNOSIS — J8282 Acute eosinophilic pneumonia: Secondary | ICD-10-CM | POA: Diagnosis not present

## 2023-06-08 DIAGNOSIS — I4892 Unspecified atrial flutter: Secondary | ICD-10-CM | POA: Diagnosis not present

## 2023-06-08 DIAGNOSIS — I5032 Chronic diastolic (congestive) heart failure: Secondary | ICD-10-CM | POA: Diagnosis not present

## 2023-06-08 DIAGNOSIS — I2699 Other pulmonary embolism without acute cor pulmonale: Secondary | ICD-10-CM | POA: Diagnosis not present

## 2023-06-09 DIAGNOSIS — I5032 Chronic diastolic (congestive) heart failure: Secondary | ICD-10-CM | POA: Diagnosis not present

## 2023-06-09 DIAGNOSIS — I4892 Unspecified atrial flutter: Secondary | ICD-10-CM | POA: Diagnosis not present

## 2023-06-09 DIAGNOSIS — I480 Paroxysmal atrial fibrillation: Secondary | ICD-10-CM | POA: Diagnosis not present

## 2023-06-09 DIAGNOSIS — I2699 Other pulmonary embolism without acute cor pulmonale: Secondary | ICD-10-CM | POA: Diagnosis not present

## 2023-06-09 DIAGNOSIS — J8282 Acute eosinophilic pneumonia: Secondary | ICD-10-CM | POA: Diagnosis not present

## 2023-06-09 DIAGNOSIS — I82401 Acute embolism and thrombosis of unspecified deep veins of right lower extremity: Secondary | ICD-10-CM | POA: Diagnosis not present

## 2023-06-12 ENCOUNTER — Ambulatory Visit: Payer: Medicare Other | Admitting: Surgery

## 2023-06-12 DIAGNOSIS — I82401 Acute embolism and thrombosis of unspecified deep veins of right lower extremity: Secondary | ICD-10-CM | POA: Diagnosis not present

## 2023-06-12 DIAGNOSIS — I5032 Chronic diastolic (congestive) heart failure: Secondary | ICD-10-CM | POA: Diagnosis not present

## 2023-06-12 DIAGNOSIS — J8282 Acute eosinophilic pneumonia: Secondary | ICD-10-CM | POA: Diagnosis not present

## 2023-06-12 DIAGNOSIS — I4892 Unspecified atrial flutter: Secondary | ICD-10-CM | POA: Diagnosis not present

## 2023-06-12 DIAGNOSIS — I480 Paroxysmal atrial fibrillation: Secondary | ICD-10-CM | POA: Diagnosis not present

## 2023-06-12 DIAGNOSIS — I2699 Other pulmonary embolism without acute cor pulmonale: Secondary | ICD-10-CM | POA: Diagnosis not present

## 2023-06-13 DIAGNOSIS — J9601 Acute respiratory failure with hypoxia: Secondary | ICD-10-CM | POA: Diagnosis not present

## 2023-06-13 DIAGNOSIS — G4700 Insomnia, unspecified: Secondary | ICD-10-CM | POA: Diagnosis not present

## 2023-06-13 DIAGNOSIS — D638 Anemia in other chronic diseases classified elsewhere: Secondary | ICD-10-CM | POA: Diagnosis not present

## 2023-06-13 DIAGNOSIS — I714 Abdominal aortic aneurysm, without rupture, unspecified: Secondary | ICD-10-CM | POA: Diagnosis not present

## 2023-06-13 DIAGNOSIS — R509 Fever, unspecified: Secondary | ICD-10-CM | POA: Diagnosis not present

## 2023-06-13 DIAGNOSIS — I82401 Acute embolism and thrombosis of unspecified deep veins of right lower extremity: Secondary | ICD-10-CM | POA: Diagnosis not present

## 2023-06-13 DIAGNOSIS — R3129 Other microscopic hematuria: Secondary | ICD-10-CM | POA: Diagnosis not present

## 2023-06-13 DIAGNOSIS — M353 Polymyalgia rheumatica: Secondary | ICD-10-CM | POA: Diagnosis not present

## 2023-06-13 DIAGNOSIS — R410 Disorientation, unspecified: Secondary | ICD-10-CM | POA: Diagnosis not present

## 2023-06-13 DIAGNOSIS — L89151 Pressure ulcer of sacral region, stage 1: Secondary | ICD-10-CM | POA: Diagnosis not present

## 2023-06-13 DIAGNOSIS — I2699 Other pulmonary embolism without acute cor pulmonale: Secondary | ICD-10-CM | POA: Diagnosis not present

## 2023-06-13 DIAGNOSIS — D72829 Elevated white blood cell count, unspecified: Secondary | ICD-10-CM | POA: Diagnosis not present

## 2023-06-14 ENCOUNTER — Encounter: Payer: Self-pay | Admitting: Primary Care

## 2023-06-14 ENCOUNTER — Ambulatory Visit (INDEPENDENT_AMBULATORY_CARE_PROVIDER_SITE_OTHER): Payer: Medicare Other | Admitting: Primary Care

## 2023-06-14 VITALS — BP 100/62 | HR 82 | Temp 97.4°F | Ht 72.0 in | Wt 140.0 lb

## 2023-06-14 DIAGNOSIS — I2699 Other pulmonary embolism without acute cor pulmonale: Secondary | ICD-10-CM | POA: Diagnosis not present

## 2023-06-14 DIAGNOSIS — R600 Localized edema: Secondary | ICD-10-CM | POA: Diagnosis not present

## 2023-06-14 DIAGNOSIS — R0602 Shortness of breath: Secondary | ICD-10-CM

## 2023-06-14 DIAGNOSIS — J8282 Acute eosinophilic pneumonia: Secondary | ICD-10-CM | POA: Diagnosis not present

## 2023-06-14 DIAGNOSIS — J9601 Acute respiratory failure with hypoxia: Secondary | ICD-10-CM

## 2023-06-14 DIAGNOSIS — J8281 Chronic eosinophilic pneumonia: Secondary | ICD-10-CM | POA: Diagnosis not present

## 2023-06-14 DIAGNOSIS — I4892 Unspecified atrial flutter: Secondary | ICD-10-CM | POA: Diagnosis not present

## 2023-06-14 DIAGNOSIS — I82401 Acute embolism and thrombosis of unspecified deep veins of right lower extremity: Secondary | ICD-10-CM | POA: Diagnosis not present

## 2023-06-14 DIAGNOSIS — J849 Interstitial pulmonary disease, unspecified: Secondary | ICD-10-CM | POA: Diagnosis not present

## 2023-06-14 DIAGNOSIS — R7989 Other specified abnormal findings of blood chemistry: Secondary | ICD-10-CM

## 2023-06-14 DIAGNOSIS — I5032 Chronic diastolic (congestive) heart failure: Secondary | ICD-10-CM | POA: Diagnosis not present

## 2023-06-14 DIAGNOSIS — I480 Paroxysmal atrial fibrillation: Secondary | ICD-10-CM | POA: Diagnosis not present

## 2023-06-14 LAB — D-DIMER, QUANTITATIVE: D-Dimer, Quant: 1.63 mcg/mL FEU — ABNORMAL HIGH (ref <0.50)

## 2023-06-14 LAB — CBC WITH DIFFERENTIAL/PLATELET
Basophils Absolute: 0.1 10*3/uL (ref 0.0–0.1)
Basophils Relative: 0.4 % (ref 0.0–3.0)
Eosinophils Absolute: 0.1 10*3/uL (ref 0.0–0.7)
Eosinophils Relative: 0.5 % (ref 0.0–5.0)
HCT: 26 % — ABNORMAL LOW (ref 39.0–52.0)
Hemoglobin: 8.1 g/dL — ABNORMAL LOW (ref 13.0–17.0)
Lymphocytes Relative: 10.2 % — ABNORMAL LOW (ref 12.0–46.0)
Lymphs Abs: 1.5 10*3/uL (ref 0.7–4.0)
MCHC: 31.1 g/dL (ref 30.0–36.0)
MCV: 82.7 fl (ref 78.0–100.0)
Monocytes Absolute: 1.1 10*3/uL — ABNORMAL HIGH (ref 0.1–1.0)
Monocytes Relative: 7.8 % (ref 3.0–12.0)
Neutro Abs: 11.9 10*3/uL — ABNORMAL HIGH (ref 1.4–7.7)
Neutrophils Relative %: 81.1 % — ABNORMAL HIGH (ref 43.0–77.0)
Platelets: 584 10*3/uL — ABNORMAL HIGH (ref 150.0–400.0)
RBC: 3.15 Mil/uL — ABNORMAL LOW (ref 4.22–5.81)
RDW: 19.7 % — ABNORMAL HIGH (ref 11.5–15.5)
WBC: 14.7 10*3/uL — ABNORMAL HIGH (ref 4.0–10.5)

## 2023-06-14 LAB — COMPREHENSIVE METABOLIC PANEL
ALT: 15 U/L (ref 0–53)
AST: 20 U/L (ref 0–37)
Albumin: 3.3 g/dL — ABNORMAL LOW (ref 3.5–5.2)
Alkaline Phosphatase: 77 U/L (ref 39–117)
BUN: 34 mg/dL — ABNORMAL HIGH (ref 6–23)
CO2: 27 mEq/L (ref 19–32)
Calcium: 8.9 mg/dL (ref 8.4–10.5)
Chloride: 101 mEq/L (ref 96–112)
Creatinine, Ser: 1.59 mg/dL — ABNORMAL HIGH (ref 0.40–1.50)
GFR: 37.97 mL/min — ABNORMAL LOW (ref >60.00)
Glucose, Bld: 107 mg/dL — ABNORMAL HIGH (ref 70–99)
Potassium: 4.8 mEq/L (ref 3.5–5.1)
Sodium: 136 mEq/L (ref 135–145)
Total Bilirubin: 0.3 mg/dL (ref 0.2–1.2)
Total Protein: 6.5 g/dL (ref 6.0–8.3)

## 2023-06-14 LAB — SEDIMENTATION RATE: Sed Rate: 104 mm/hr — ABNORMAL HIGH (ref 0–20)

## 2023-06-14 LAB — BRAIN NATRIURETIC PEPTIDE: Pro B Natriuretic peptide (BNP): 243 pg/mL — ABNORMAL HIGH (ref 0.0–100.0)

## 2023-06-14 NOTE — Progress Notes (Deleted)
@Patient  ID: [name], male    DOB: 05/31/1930, 89 y.o.   MRN: [id number]  Chief Complaint  Patient presents with   Acute Visit    O1-2 l  SOB     Referring provider: [name], MD  HPI: 89 year old male,  Previous [facility] pulmonary encounter:  Chief Complaint  Patient presents with   Hospitalization Follow-up    HFU. Still using 4L of O2.    [name] is a 89 year old male with history of polymyalgia rheumatica on chronic prednisone and recent hospitalization at [facility] where he was diagnosed with aortitis and started on daptomycin and rocephin via PICC line on 3/14. He presented to [facility] on 4/7 with generalized weakness and shortness of breath. CTA Chest on admission showed pulmonary embolism and bilateral patchy ground-glass and consolidative opacities with interlobular septal thickening superimposed on back ground subpleural reticulation and bronchiolectasis. Echocardiogram did not show RV strain. He was started on heparin drip. He required transfer to the ICU for acute hypoxemic respiratory failure with heated high flow oxygen requirements. He was started on pulse dose steroids and broad spectrum antibiotics. Bronchoscopy was not performed due to his significant oxygen requirements. He was transferred out of the ICU on 4/18 with decreasing O2 requirements. He was discharged on 40mg  of prednisone daily and bactrim prophylaxis. He was started on nystatinc for thrush. He was discharged on 1-2L of oxygen and went to rehab.   He started having bowel trouble at [facility] rehab for about a month with constipation. He has been started on aggressive bowel regimen and has required manual disimpaction per his wife. He had large explosive bowel movement in clinic today.   His prednisone was reduced to 20mg  daily at rehab due to concerns of intolerance of high dose therapy. He continues bactrim DS 1 tab 3 days per week.   He is working on walking with a walker, he walked 32 steps  yesterday without assistance.   He has reduced appetite and is trying to drink protein supplements daily.   06/14/2023 Patient presents today for 1 month follow-up/acute office visit.  He was seen by Dr. Francine Graven on 05/01/2023 hospital follow-up due to 3 failure in the setting of interstitial lung disease.  Patient has a history of Polymyalgia rheumatica on chronic prednisone.  Differential for respiratory failure includes eosinophilic pneumonia in the setting of daptomycin use versus progressive ILD process.  Patient has mild basilar reticulation on CT chest from March 2022.  Inflammatory workup has been negative.  Follow-up chest x-ray on 05/01/2023 showed patchy bilateral interstitial and alveolar opacity bilaterally not significantly changed compared to prior chest x-ray from April 2024.    Accompanied by his wife and nursing aid today Breathing is about the same  He is on supplemental oxygen, for the most part he is using 1L oxygen at home  He is on 20mg  prednisone daily with Bactrim double strength 1 tablet 3 days/week for pneumocystitis prophylaxis.  Need to discuss prednisone taper if able   Patient had UTI symptoms (frequency/urgency, fever and confusion) on July 6. He had some air hunger at this time. No associated URI symptoms or cough. Prescribed abx Augmentin x 10 days and doxycycline x10 days. CXR done with Dr. Waynard Edwards, results are not accessible in Epic. He is doing better. He is more tired last several days. He is having some stomach/abdomen discomfort. Struggles with constipation.    Allergies  Allergen Reactions   Rosuvastatin Other (See Comments)  Stopped taking due to feeling achy     Codeine Nausea And Vomiting   Morphine And Codeine Nausea And Vomiting    Immunization History  Administered Date(s) Administered   Dtap, Unspecified 06/29/2010   Influenza, High Dose Seasonal PF 09/01/2017, 08/31/2022   Influenza, Quadrivalent, Recombinant, Inj, Pf 09/01/2018, 08/31/2019,  08/19/2020, 09/24/2021   Influenza,inj,Quad PF,6+ Mos 08/20/2013, 08/26/2014, 09/23/2015, 10/04/2016   Influenza-Unspecified 09/22/2006, 09/26/2007, 10/13/2012   PFIZER(Purple Top)SARS-COV-2 Vaccination 12/10/2019, 12/28/2019, 08/10/2020   Pneumococcal Conjugate-13 05/30/2018   Pneumococcal Polysaccharide-23 07/21/1998, 10/26/2022   Tdap 06/29/2010    Past Medical History:  Diagnosis Date   AAA (abdominal aortic aneurysm) (HCC)    Abnormal EKG    Left atrial abnormality   Allergic rhinitis    Allergy    Benign neoplasm of colon 04/14/10   3 small polyps on colonoscopy by Dr. Marina Goodell   Carpal tunnel syndrome    Cataract    Degenerative disc disease    Disturbances metabolism of methionine, homocystine, and cystathionine (HCC)    Elevated homocysteine   Elevated prostate specific antigen (PSA)    Hearing loss    Hyperlipidemia    Impotence of organic origin    Penile implant   Internal hemorrhoids    Neck pain    Otosclerosis of both ears    Peripheral vascular disease (HCC)    Bilateral femoral bruit   Plantar fasciitis    Polymyalgia rheumatica (HCC)    Rotator cuff syndrome of left shoulder    Scoliosis    Shoulder pain     Tobacco History: Social History   Tobacco Use  Smoking Status Never  Smokeless Tobacco Never   Counseling given: Not Answered   Outpatient Medications Prior to Visit  Medication Sig Dispense Refill   acetaminophen (TYLENOL) 325 MG tablet Take 2 tablets (650 mg total) by mouth every 6 (six) hours as needed for mild pain (or Fever >/= 101).     amoxicillin-clavulanate (AUGMENTIN) 875-125 MG tablet SMARTSIG:1 Tablet(s) By Mouth Every 12 Hours     apixaban (ELIQUIS) 2.5 MG TABS tablet Take 2.5 mg by mouth 2 (two) times daily.     aspirin 81 MG chewable tablet Chew 1 tablet (81 mg total) by mouth daily.     bisacodyl 5 MG EC tablet Take 10 mg by mouth daily as needed for moderate constipation.     butalbital-acetaminophen-caffeine (FIORICET)  50-325-40 MG tablet Take 1 tablet by mouth daily as needed for headache. 14 tablet 0   cholecalciferol (VITAMIN D3) 25 MCG (1000 UNIT) tablet Take 1,000 Units by mouth daily.     diclofenac Sodium (VOLTAREN) 1 % GEL Apply 2 g topically 4 (four) times daily as needed (shoulder pain). 2 g 0   docusate sodium (COLACE) 100 MG capsule Take 100 mg by mouth 2 (two) times daily.     doxycycline (VIBRAMYCIN) 100 MG capsule Take 100 mg by mouth 2 (two) times daily.     feeding supplement (ENSURE ENLIVE / ENSURE PLUS) LIQD Take 237 mLs by mouth 3 (three) times daily between meals. 237 mL 12   ferrous sulfate 324 MG TBEC Take 324 mg by mouth daily. As needed     finasteride (PROSCAR) 5 MG tablet Take 5 mg by mouth daily.     fish oil-omega-3 fatty acids 1000 MG capsule Take 1,000 mg by mouth daily.      fluorometholone (FML) 0.1 % ophthalmic suspension Place 1 drop into the right eye at bedtime.  folic acid (FOLVITE) 1 MG tablet Take 1 mg by mouth daily.     L-Methylfolate-B12-B6-B2 (METAFOLBIC) 04-28-49-5 MG TABS TAKE ONE TABLET TWICE DAILY (Patient taking differently: Take 1 tablet by mouth 2 (two) times daily.) 60 tablet 2   linaclotide (LINZESS) 72 MCG capsule Take 72 mcg by mouth daily before breakfast.     LORazepam (ATIVAN) 0.5 MG tablet Take 0.5 mg by mouth daily.     magnesium hydroxide (MILK OF MAGNESIA) 400 MG/5ML suspension Take by mouth daily as needed for mild constipation.     melatonin 3 MG TABS tablet Take 1 tablet (3 mg total) by mouth at bedtime. (Patient taking differently: Take 5 mg by mouth as needed.)  0   metoprolol succinate (TOPROL XL) 25 MG 24 hr tablet Take 1 tablet (25 mg total) by mouth daily. 90 tablet 3   mirtazapine (REMERON) 15 MG tablet Take 15 mg by mouth at bedtime.     Multiple Vitamins-Minerals (MULTIVITAMIN PO) Take 1 tablet by mouth daily.      pantoprazole (PROTONIX) 40 MG tablet Take 1 tablet (40 mg total) by mouth 2 (two) times daily for 30 days, THEN 1 tablet (40  mg total) daily. 420 tablet 0   polyethylene glycol (MIRALAX / GLYCOLAX) 17 g packet Take 17 g by mouth 2 (two) times daily.     predniSONE (DELTASONE) 20 MG tablet Take 2 tablets (40 mg total) by mouth daily with breakfast. (Patient taking differently: Take 40 mg by mouth daily with breakfast. 20mg  daily)     senna-docusate (SENOKOT-S) 8.6-50 MG tablet Take 1 tablet by mouth 2 (two) times daily.     sulfamethoxazole-trimethoprim (BACTRIM DS) 800-160 MG tablet Take 1 tablet by mouth 3 (three) times a week. Monday, Wednesday, Friday (Patient taking differently: Take 1 tablet by mouth 3 (three) times a week. Monday, Wednesday, Friday for PJP prophylaxis)     tamsulosin (FLOMAX) 0.4 MG CAPS capsule Take 0.4 mg by mouth at bedtime.     Tart Cherry 1200 MG CAPS Take 1 tablet by mouth daily.     traMADol (ULTRAM) 50 MG tablet Take 50 mg by mouth every 4 (four) hours as needed for moderate pain or severe pain.     traZODone (DESYREL) 50 MG tablet Take 50 mg by mouth as needed.     famotidine (PEPCID) 20 MG tablet Take 1 tablet (20 mg total) by mouth daily. (Patient not taking: Reported on 06/14/2023)     No facility-administered medications prior to visit.      Review of Systems  Review of Systems   Physical Exam  BP 100/62 (BP Location: Left Arm, Patient Position: Sitting, Cuff Size: Normal)   Pulse 82   Temp (!) 97.4 F (36.3 C) (Temporal)   Ht 6' (1.829 m)   Wt 140 lb (63.5 kg)   SpO2 97%   BMI 18.99 kg/m  Physical Exam   Lab Results:  CBC    Component Value Date/Time   WBC 14.7 (H) 06/14/2023 1217   RBC 3.15 (L) 06/14/2023 1217   HGB 8.1 Repeated and verified X2. (L) 06/14/2023 1217   HCT 26.0 Repeated and verified X2. (L) 06/14/2023 1217   PLT 584.0 (H) 06/14/2023 1217   MCV 82.7 06/14/2023 1217   MCH 31.7 03/23/2023 0315   MCHC 31.1 06/14/2023 1217   RDW 19.7 (H) 06/14/2023 1217   RDW 14.1 02/14/2014 0821   LYMPHSABS 1.5 06/14/2023 1217   LYMPHSABS 1.6 02/14/2014 0821    MONOABS 1.1 (  H) 06/14/2023 1217   EOSABS 0.1 06/14/2023 1217   EOSABS 0.2 02/14/2014 0821   BASOSABS 0.1 06/14/2023 1217   BASOSABS 0.0 02/14/2014 0821    BMET    Component Value Date/Time   NA 136 06/14/2023 1217   NA 142 03/21/2016 0819   K 4.8 06/14/2023 1217   CL 101 06/14/2023 1217   CO2 27 06/14/2023 1217   GLUCOSE 107 (H) 06/14/2023 1217   BUN 34 (H) 06/14/2023 1217   BUN 21 03/21/2016 0819   CREATININE 1.59 (H) 06/14/2023 1217   CREATININE 0.91 05/24/2017 0922   CALCIUM 8.9 06/14/2023 1217   GFRNONAA >60 03/23/2023 0315   GFRNONAA 77 05/24/2017 0922   GFRAA >60 01/24/2018 1402   GFRAA 89 05/24/2017 0922    BNP    Component Value Date/Time   BNP 121.7 (H) 03/05/2023 1614    ProBNP    Component Value Date/Time   PROBNP 243.0 (H) 06/14/2023 1217    Imaging: LONG TERM MONITOR (3-14 DAYS)  Result Date: 05/28/2023   1 episode of NSVT lasting 8 beats   1966 episodes of SVT, longest lasting 2 minutes 5 seconds with average rate 160 bpm   Frequent PACs (6.2%), and occasional supraventricular couplets (1.8%), and occasional supraventricular triplets (1.0%)   Occasional PVCs (2.3%) Patch Wear Time:  8 days and 3 hours (2024-06-04T10:07:45-0400 to 2024-06-12T13:17:51-0400) Patient had a min HR of 59 bpm, max HR of 187 bpm, and avg HR of 97 bpm. Predominant underlying rhythm was Sinus Rhythm. 1 run of Ventricular Tachycardia occurred lasting 8 beats with a max rate of 171 bpm (avg 104 bpm). 1966 Supraventricular Tachycardia  runs occurred, the run with the fastest interval lasting 5 beats with a max rate of 187 bpm, the longest lasting 2 mins 5 secs with an avg rate of 160 bpm. Isolated SVEs were frequent (6.2%, [id number]), SVE Couplets were occasional (1.8%, 9750), and SVE Triplets were occasional (1.0%, 3815). Isolated VEs were occasional (2.3%, 25182), VE Couplets were rare (<1.0%, 691), and VE Triplets were rare (<1.0%, 42). Ventricular Bigeminy and Trigeminy were present.  Some VE(s) may be SVE(s) conducted with possible aberrancy.  No patient triggered event     Assessment & Plan:   No problem-specific Assessment & Plan notes found for this encounter.     Glenford Bayley, NP 06/14/2023

## 2023-06-14 NOTE — Patient Instructions (Addendum)
Recommendations: Decreased Prednisone to 15mg  daily Ok to stop Bactrim DS (if able to taper steroid dose) Ok to take mild of magnesium Continue 1-2L oxygen to maintain O2 >90%  Orders: High resolution CT chest Labs today   Follow-up: 2-4 weeks with Dr. Francine Graven (End of July or early August)

## 2023-06-15 ENCOUNTER — Ambulatory Visit: Payer: Medicare Other

## 2023-06-15 ENCOUNTER — Telehealth: Payer: Self-pay

## 2023-06-15 ENCOUNTER — Other Ambulatory Visit: Payer: Self-pay | Admitting: Internal Medicine

## 2023-06-15 DIAGNOSIS — M7989 Other specified soft tissue disorders: Secondary | ICD-10-CM

## 2023-06-15 DIAGNOSIS — R600 Localized edema: Secondary | ICD-10-CM | POA: Insufficient documentation

## 2023-06-15 NOTE — Assessment & Plan Note (Signed)
-   New; +1-2 LLE edema - Checking labs, including BNP and D-DIMER

## 2023-06-15 NOTE — Progress Notes (Signed)
@Patient  ID: [name], male    DOB: 1930/02/18, 89 y.o.   MRN: [id number]  Chief Complaint  Patient presents with   Acute Visit    O1-2 l  SOB     Referring provider: [name], MD  HPI: 89 year old male, former smoker.  Medical history significant for A-fib, pulmonary embolism, chronic diastolic heart failure, AAA, eosinophilic pneumonia, ILD, acute respiratory failure with hypoxia. Patient of Dr. Francine Graven.   Previous [facility] pulmonary encounter:  05/01/2023- Dr. Francine Graven     Chief Complaint  Patient presents with   Hospitalization Follow-up      HFU. Still using 4L of O2.     [name] is a 89 year old male with history of polymyalgia rheumatica on chronic prednisone and recent hospitalization at [facility] where he was diagnosed with aortitis and started on daptomycin and rocephin via PICC line on 3/14. He presented to [facility] on 4/7 with generalized weakness and shortness of breath. CTA Chest on admission showed pulmonary embolism and bilateral patchy ground-glass and consolidative opacities with interlobular septal thickening superimposed on back ground subpleural reticulation and bronchiolectasis. Echocardiogram did not show RV strain. He was started on heparin drip. He required transfer to the ICU for acute hypoxemic respiratory failure with heated high flow oxygen requirements. He was started on pulse dose steroids and broad spectrum antibiotics. Bronchoscopy was not performed due to his significant oxygen requirements. He was transferred out of the ICU on 4/18 with decreasing O2 requirements. He was discharged on 40mg  of prednisone daily and bactrim prophylaxis. He was started on nystatinc for thrush. He was discharged on 1-2L of oxygen and went to rehab.    He started having bowel trouble at [facility] rehab for about a month with constipation. He has been started on aggressive bowel regimen and has required manual disimpaction per his wife. He had large explosive bowel  movement in clinic today.    His prednisone was reduced to 20mg  daily at rehab due to concerns of intolerance of high dose therapy. He continues bactrim DS 1 tab 3 days per week.    He is working on walking with a walker, he walked 32 steps yesterday without assistance.    He has reduced appetite and is trying to drink protein supplements daily.     06/14/2023 - Interim hx Patient presents today for 1 month follow-up/acute office visit.  He was seen by Dr. Francine Graven on 05/01/2023 hospital follow-up due to respiratory failure in the setting of interstitial lung disease.  Patient has a history of Polymyalgia rheumatica on chronic prednisone.  Differential for respiratory failure includes eosinophilic pneumonia in the setting of daptomycin use versus progressive ILD process.  Patient has mild basilar reticulation on CT chest from March 2022.  Inflammatory workup has been negative.  Follow-up chest x-ray on 05/01/2023 showed patchy bilateral interstitial and alveolar opacity bilaterally not significantly changed compared to prior chest x-ray from April 2024.    Accompanied by his wife and nursing aid today Breathing is about the same  He is on supplemental oxygen, for the most part he is using 1L oxygen at home  He is on 20mg  prednisone daily with Bactrim double strength 1 tablet 3 days/week for pneumocystitis prophylaxis.  Need to discuss prednisone taper if able    Patient had UTI symptoms (frequency/urgency, fever and confusion) on July 6. He had some air hunger at this time. No associated URI symptoms or cough. Prescribed abx Augmentin x 10 days and doxycycline x10  days. CXR done with Dr. Waynard Edwards, results are not accessible in Epic. He is doing better. He is more tired last several days. He is having some stomach/abdomen discomfort. He does struggle with constipation.  He had a small bowel movement today.  He has been compliant with stool softener.   Allergies  Allergen Reactions   Rosuvastatin Other  (See Comments)    Stopped taking due to feeling achy     Codeine Nausea And Vomiting   Morphine And Codeine Nausea And Vomiting    Immunization History  Administered Date(s) Administered   Dtap, Unspecified 06/29/2010   Influenza, High Dose Seasonal PF 09/01/2017, 08/31/2022   Influenza, Quadrivalent, Recombinant, Inj, Pf 09/01/2018, 08/31/2019, 08/19/2020, 09/24/2021   Influenza,inj,Quad PF,6+ Mos 08/20/2013, 08/26/2014, 09/23/2015, 10/04/2016   Influenza-Unspecified 09/22/2006, 09/26/2007, 10/13/2012   PFIZER(Purple Top)SARS-COV-2 Vaccination 12/10/2019, 12/28/2019, 08/10/2020   Pneumococcal Conjugate-13 05/30/2018   Pneumococcal Polysaccharide-23 07/21/1998, 10/26/2022   Tdap 06/29/2010    Past Medical History:  Diagnosis Date   AAA (abdominal aortic aneurysm) (HCC)    Abnormal EKG    Left atrial abnormality   Allergic rhinitis    Allergy    Benign neoplasm of colon 04/14/10   3 small polyps on colonoscopy by Dr. Marina Goodell   Carpal tunnel syndrome    Cataract    Degenerative disc disease    Disturbances metabolism of methionine, homocystine, and cystathionine (HCC)    Elevated homocysteine   Elevated prostate specific antigen (PSA)    Hearing loss    Hyperlipidemia    Impotence of organic origin    Penile implant   Internal hemorrhoids    Neck pain    Otosclerosis of both ears    Peripheral vascular disease (HCC)    Bilateral femoral bruit   Plantar fasciitis    Polymyalgia rheumatica (HCC)    Rotator cuff syndrome of left shoulder    Scoliosis    Shoulder pain     Tobacco History: Social History   Tobacco Use  Smoking Status Never  Smokeless Tobacco Never   Counseling given: Not Answered   Outpatient Medications Prior to Visit  Medication Sig Dispense Refill   acetaminophen (TYLENOL) 325 MG tablet Take 2 tablets (650 mg total) by mouth every 6 (six) hours as needed for mild pain (or Fever >/= 101).     amoxicillin-clavulanate (AUGMENTIN) 875-125 MG tablet  SMARTSIG:1 Tablet(s) By Mouth Every 12 Hours     apixaban (ELIQUIS) 2.5 MG TABS tablet Take 2.5 mg by mouth 2 (two) times daily.     aspirin 81 MG chewable tablet Chew 1 tablet (81 mg total) by mouth daily.     bisacodyl 5 MG EC tablet Take 10 mg by mouth daily as needed for moderate constipation.     butalbital-acetaminophen-caffeine (FIORICET) 50-325-40 MG tablet Take 1 tablet by mouth daily as needed for headache. 14 tablet 0   cholecalciferol (VITAMIN D3) 25 MCG (1000 UNIT) tablet Take 1,000 Units by mouth daily.     diclofenac Sodium (VOLTAREN) 1 % GEL Apply 2 g topically 4 (four) times daily as needed (shoulder pain). 2 g 0   docusate sodium (COLACE) 100 MG capsule Take 100 mg by mouth 2 (two) times daily.     doxycycline (VIBRAMYCIN) 100 MG capsule Take 100 mg by mouth 2 (two) times daily.     feeding supplement (ENSURE ENLIVE / ENSURE PLUS) LIQD Take 237 mLs by mouth 3 (three) times daily between meals. 237 mL 12   ferrous sulfate 324 MG TBEC  Take 324 mg by mouth daily. As needed     finasteride (PROSCAR) 5 MG tablet Take 5 mg by mouth daily.     fish oil-omega-3 fatty acids 1000 MG capsule Take 1,000 mg by mouth daily.      fluorometholone (FML) 0.1 % ophthalmic suspension Place 1 drop into the right eye at bedtime.      folic acid (FOLVITE) 1 MG tablet Take 1 mg by mouth daily.     L-Methylfolate-B12-B6-B2 (METAFOLBIC) 04-28-49-5 MG TABS TAKE ONE TABLET TWICE DAILY (Patient taking differently: Take 1 tablet by mouth 2 (two) times daily.) 60 tablet 2   linaclotide (LINZESS) 72 MCG capsule Take 72 mcg by mouth daily before breakfast.     LORazepam (ATIVAN) 0.5 MG tablet Take 0.5 mg by mouth daily.     magnesium hydroxide (MILK OF MAGNESIA) 400 MG/5ML suspension Take by mouth daily as needed for mild constipation.     melatonin 3 MG TABS tablet Take 1 tablet (3 mg total) by mouth at bedtime. (Patient taking differently: Take 5 mg by mouth as needed.)  0   metoprolol succinate (TOPROL XL) 25  MG 24 hr tablet Take 1 tablet (25 mg total) by mouth daily. 90 tablet 3   mirtazapine (REMERON) 15 MG tablet Take 15 mg by mouth at bedtime.     Multiple Vitamins-Minerals (MULTIVITAMIN PO) Take 1 tablet by mouth daily.      pantoprazole (PROTONIX) 40 MG tablet Take 1 tablet (40 mg total) by mouth 2 (two) times daily for 30 days, THEN 1 tablet (40 mg total) daily. 420 tablet 0   polyethylene glycol (MIRALAX / GLYCOLAX) 17 g packet Take 17 g by mouth 2 (two) times daily.     predniSONE (DELTASONE) 20 MG tablet Take 2 tablets (40 mg total) by mouth daily with breakfast. (Patient taking differently: Take 40 mg by mouth daily with breakfast. 20mg  daily)     senna-docusate (SENOKOT-S) 8.6-50 MG tablet Take 1 tablet by mouth 2 (two) times daily.     sulfamethoxazole-trimethoprim (BACTRIM DS) 800-160 MG tablet Take 1 tablet by mouth 3 (three) times a week. Monday, Wednesday, Friday (Patient taking differently: Take 1 tablet by mouth 3 (three) times a week. Monday, Wednesday, Friday for PJP prophylaxis)     tamsulosin (FLOMAX) 0.4 MG CAPS capsule Take 0.4 mg by mouth at bedtime.     Tart Cherry 1200 MG CAPS Take 1 tablet by mouth daily.     traMADol (ULTRAM) 50 MG tablet Take 50 mg by mouth every 4 (four) hours as needed for moderate pain or severe pain.     traZODone (DESYREL) 50 MG tablet Take 50 mg by mouth as needed.     famotidine (PEPCID) 20 MG tablet Take 1 tablet (20 mg total) by mouth daily. (Patient not taking: Reported on 06/14/2023)     No facility-administered medications prior to visit.      Review of Systems  Review of Systems  Constitutional:  Positive for fatigue. Negative for fever.  Respiratory:  Positive for shortness of breath. Negative for cough, chest tightness and wheezing.   Cardiovascular:  Positive for leg swelling.  Gastrointestinal:  Positive for abdominal distention.     Physical Exam  BP 100/62 (BP Location: Left Arm, Patient Position: Sitting, Cuff Size: Normal)    Pulse 82   Temp (!) 97.4 F (36.3 C) (Temporal)   Ht 6' (1.829 m)   Wt 140 lb (63.5 kg)   SpO2 97%   BMI 18.99  kg/m  Physical Exam Constitutional:      Appearance: Normal appearance.  HENT:     Head: Normocephalic and atraumatic.  Cardiovascular:     Rate and Rhythm: Normal rate.     Comments: +1-2 LLE  Pulmonary:     Effort: Pulmonary effort is normal.     Breath sounds: Rales present.  Abdominal:     General: There is distension.     Tenderness: There is no abdominal tenderness.  Musculoskeletal:        General: Normal range of motion.     Comments: In WC  Skin:    General: Skin is warm and dry.  Neurological:     General: No focal deficit present.     Mental Status: He is alert and oriented to person, place, and time. Mental status is at baseline.      Lab Results:  CBC    Component Value Date/Time   WBC 14.7 (H) 06/14/2023 1217   RBC 3.15 (L) 06/14/2023 1217   HGB 8.1 Repeated and verified X2. (L) 06/14/2023 1217   HCT 26.0 Repeated and verified X2. (L) 06/14/2023 1217   PLT 584.0 (H) 06/14/2023 1217   MCV 82.7 06/14/2023 1217   MCH 31.7 03/23/2023 0315   MCHC 31.1 06/14/2023 1217   RDW 19.7 (H) 06/14/2023 1217   RDW 14.1 02/14/2014 0821   LYMPHSABS 1.5 06/14/2023 1217   LYMPHSABS 1.6 02/14/2014 0821   MONOABS 1.1 (H) 06/14/2023 1217   EOSABS 0.1 06/14/2023 1217   EOSABS 0.2 02/14/2014 0821   BASOSABS 0.1 06/14/2023 1217   BASOSABS 0.0 02/14/2014 0821    BMET    Component Value Date/Time   NA 136 06/14/2023 1217   NA 142 03/21/2016 0819   K 4.8 06/14/2023 1217   CL 101 06/14/2023 1217   CO2 27 06/14/2023 1217   GLUCOSE 107 (H) 06/14/2023 1217   BUN 34 (H) 06/14/2023 1217   BUN 21 03/21/2016 0819   CREATININE 1.59 (H) 06/14/2023 1217   CREATININE 0.91 05/24/2017 0922   CALCIUM 8.9 06/14/2023 1217   GFRNONAA >60 03/23/2023 0315   GFRNONAA 77 05/24/2017 0922   GFRAA >60 01/24/2018 1402   GFRAA 89 05/24/2017 0922    BNP    Component  Value Date/Time   BNP 121.7 (H) 03/05/2023 1614    ProBNP    Component Value Date/Time   PROBNP 243.0 (H) 06/14/2023 1217    Imaging: LONG TERM MONITOR (3-14 DAYS)  Result Date: 05/28/2023   1 episode of NSVT lasting 8 beats   1966 episodes of SVT, longest lasting 2 minutes 5 seconds with average rate 160 bpm   Frequent PACs (6.2%), and occasional supraventricular couplets (1.8%), and occasional supraventricular triplets (1.0%)   Occasional PVCs (2.3%) Patch Wear Time:  8 days and 3 hours (2024-06-04T10:07:45-0400 to 2024-06-12T13:17:51-0400) Patient had a min HR of 59 bpm, max HR of 187 bpm, and avg HR of 97 bpm. Predominant underlying rhythm was Sinus Rhythm. 1 run of Ventricular Tachycardia occurred lasting 8 beats with a max rate of 171 bpm (avg 104 bpm). 1966 Supraventricular Tachycardia  runs occurred, the run with the fastest interval lasting 5 beats with a max rate of 187 bpm, the longest lasting 2 mins 5 secs with an avg rate of 160 bpm. Isolated SVEs were frequent (6.2%, [id number]), SVE Couplets were occasional (1.8%, 9750), and SVE Triplets were occasional (1.0%, 3815). Isolated VEs were occasional (2.3%, 25182), VE Couplets were rare (<1.0%, 691), and VE Triplets were  rare (<1.0%, 42). Ventricular Bigeminy and Trigeminy were present. Some VE(s) may be SVE(s) conducted with possible aberrancy.  No patient triggered event     Assessment & Plan:   Eosinophilic pneumonia (HCC) - History of polymyalgia rheumatica. Hospitalized in April for respiratory failure. ILD findings on CT imaging. Differential for his respiratory failure includes eosinophilic pneumonia in setting of daptomycin use vs progressive ILD process. Patient is currently on prednisone 20mg  daily and Bactrim DS. He is doing alright breathing wise, he has seen no real improvement in dyspnea symptoms since being on steroids but symptoms have not worsened. We will try to decrease Prednisone to 15mg  daily, ok to stop Bactrim while on  lower dose. Needs repeat CT imaging of chest to re-assess ILD since being on treatment / If breathing worsens advised patient contact office    Acute respiratory failure with hypoxia (HCC) - Stable/improved; He is requiring less oxygen, O2 97% on 1L  - Continue 1-2L supplement oxygen to maintain O2 >90%  Edema of left lower extremity - New; +1-2 LLE edema - Checking labs, including BNP and D-DIMER  Follow-up 2-4 weeks with Dr. Francine Graven (End of July or early August)  Glenford Bayley, NP 06/15/2023

## 2023-06-15 NOTE — Progress Notes (Signed)
If he is doing worse breathing wise or having any acute issues I would direct him to ED.

## 2023-06-15 NOTE — Progress Notes (Signed)
[name] this patient needs bilateral LE dopplers and VQ scan re: elevated D-dimer. He also needs to reach out to his cardiologist if he has one in regards to his BNP being elevated meaning he is holding on to fluid which could be due to heart failure. WBC is trending down, kidney function worse.   Does he take a diuretics?

## 2023-06-15 NOTE — Assessment & Plan Note (Addendum)
-   Stable/improved; He is requiring less oxygen, O2 97% on 1L  - Continue 1-2L supplement oxygen to maintain O2 >90%

## 2023-06-15 NOTE — Telephone Encounter (Signed)
Left detailed message on VM for patient to call back as urgent call back.   This patient needs bilateral LE dopplers and VQ scan re: elevated D-dimer. He also needs to reach out to his cardiologist if he has one in regards to his BNP being elevated meaning he is holding on to fluid which could be due to heart failure. WBC is trending down, kidney function worse.  Does he take a diuretics?   Per Cordelia Pen, [facility], the orders for bilateral LE dopplers and VQ scan re: elevated D-dimer, have already been placed in EPIC today.  Cordelia Pen has tried several attempts to reach patient this afternoon without success.  Crissie Sickles, [place], [place] Henreitta Cea [place], [place], Vara Guardian, [place], [place]ak[place]ah [place], Cheron Coryell [place], CMA and Ames Dura, NP have all been included in a secure chat today, 06/15/2023 trying to reach patient, get tests scheduled and any pre-approvals.  As of 5:03 PM, patient has been unable to be contacted.  This is documented in patients chart.

## 2023-06-15 NOTE — Progress Notes (Signed)
I called patient but no answer, I left message to have them call triage back and sent result message

## 2023-06-15 NOTE — Assessment & Plan Note (Addendum)
-   History of polymyalgia rheumatica. Hospitalized in April for respiratory failure. ILD findings on CT imaging. Differential for his respiratory failure includes eosinophilic pneumonia in setting of daptomycin use vs progressive ILD process. Patient is currently on prednisone 20mg  daily and Bactrim DS. He is doing alright breathing wise, he has seen no real improvement in dyspnea symptoms since being on steroids but symptoms have not worsened. We will try to decrease Prednisone to 15mg  daily, ok to stop Bactrim while on lower dose. Needs repeat CT imaging of chest to re-assess ILD since being on treatment / If breathing worsens advised patient contact office

## 2023-06-16 ENCOUNTER — Other Ambulatory Visit: Payer: Self-pay | Admitting: Adult Health

## 2023-06-16 ENCOUNTER — Ambulatory Visit
Admission: RE | Admit: 2023-06-16 | Discharge: 2023-06-16 | Disposition: A | Discharge status: Home / Self Care | Payer: Medicare Other | Source: Ambulatory Visit | Attending: Adult Health | Admitting: Adult Health

## 2023-06-16 ENCOUNTER — Ambulatory Visit (HOSPITAL_COMMUNITY)
Admission: RE | Admit: 2023-06-16 | Discharge: 2023-06-16 | Disposition: A | Discharge status: Home / Self Care | Payer: Medicare Other | Source: Ambulatory Visit | Attending: Internal Medicine | Admitting: Internal Medicine

## 2023-06-16 DIAGNOSIS — J8410 Pulmonary fibrosis, unspecified: Secondary | ICD-10-CM | POA: Diagnosis not present

## 2023-06-16 DIAGNOSIS — R1904 Left lower quadrant abdominal swelling, mass and lump: Secondary | ICD-10-CM

## 2023-06-16 DIAGNOSIS — J90 Pleural effusion, not elsewhere classified: Secondary | ICD-10-CM | POA: Diagnosis not present

## 2023-06-16 DIAGNOSIS — I82401 Acute embolism and thrombosis of unspecified deep veins of right lower extremity: Secondary | ICD-10-CM | POA: Diagnosis not present

## 2023-06-16 DIAGNOSIS — M7989 Other specified soft tissue disorders: Secondary | ICD-10-CM | POA: Diagnosis not present

## 2023-06-16 DIAGNOSIS — R3914 Feeling of incomplete bladder emptying: Secondary | ICD-10-CM | POA: Diagnosis not present

## 2023-06-16 DIAGNOSIS — G4700 Insomnia, unspecified: Secondary | ICD-10-CM | POA: Diagnosis not present

## 2023-06-16 DIAGNOSIS — J9601 Acute respiratory failure with hypoxia: Secondary | ICD-10-CM | POA: Diagnosis not present

## 2023-06-16 DIAGNOSIS — I714 Abdominal aortic aneurysm, without rupture, unspecified: Secondary | ICD-10-CM | POA: Diagnosis not present

## 2023-06-16 DIAGNOSIS — E871 Hypo-osmolality and hyponatremia: Secondary | ICD-10-CM | POA: Diagnosis not present

## 2023-06-16 DIAGNOSIS — N3289 Other specified disorders of bladder: Secondary | ICD-10-CM | POA: Diagnosis not present

## 2023-06-16 DIAGNOSIS — R1900 Intra-abdominal and pelvic swelling, mass and lump, unspecified site: Secondary | ICD-10-CM

## 2023-06-16 DIAGNOSIS — N390 Urinary tract infection, site not specified: Secondary | ICD-10-CM | POA: Diagnosis not present

## 2023-06-16 DIAGNOSIS — N2889 Other specified disorders of kidney and ureter: Secondary | ICD-10-CM | POA: Diagnosis not present

## 2023-06-16 DIAGNOSIS — D638 Anemia in other chronic diseases classified elsewhere: Secondary | ICD-10-CM | POA: Diagnosis not present

## 2023-06-16 DIAGNOSIS — I2510 Atherosclerotic heart disease of native coronary artery without angina pectoris: Secondary | ICD-10-CM | POA: Diagnosis not present

## 2023-06-16 DIAGNOSIS — R509 Fever, unspecified: Secondary | ICD-10-CM | POA: Diagnosis not present

## 2023-06-16 DIAGNOSIS — N401 Enlarged prostate with lower urinary tract symptoms: Secondary | ICD-10-CM | POA: Diagnosis not present

## 2023-06-16 DIAGNOSIS — I480 Paroxysmal atrial fibrillation: Secondary | ICD-10-CM | POA: Diagnosis not present

## 2023-06-16 DIAGNOSIS — K5900 Constipation, unspecified: Secondary | ICD-10-CM | POA: Diagnosis not present

## 2023-06-16 DIAGNOSIS — N179 Acute kidney failure, unspecified: Secondary | ICD-10-CM | POA: Diagnosis not present

## 2023-06-16 DIAGNOSIS — N400 Enlarged prostate without lower urinary tract symptoms: Secondary | ICD-10-CM | POA: Diagnosis not present

## 2023-06-16 DIAGNOSIS — D72829 Elevated white blood cell count, unspecified: Secondary | ICD-10-CM | POA: Diagnosis not present

## 2023-06-16 DIAGNOSIS — K7689 Other specified diseases of liver: Secondary | ICD-10-CM | POA: Diagnosis not present

## 2023-06-16 DIAGNOSIS — I700 Atherosclerosis of aorta: Secondary | ICD-10-CM | POA: Diagnosis not present

## 2023-06-16 MED ORDER — IOPAMIDOL (ISOVUE-300) INJECTION 61%
80.0000 mL | Freq: Once | INTRAVENOUS | Status: AC | PRN
  Administered 2023-06-16: 80 mL via INTRAVENOUS

## 2023-06-16 NOTE — Telephone Encounter (Signed)
I agree with them, some of his labs were abnormal and should be evaluated in ED We can discontinue my order for dopplers

## 2023-06-16 NOTE — Telephone Encounter (Signed)
Darl Pikes wife is returning phone call. Darl Pikes phone number is [phone].

## 2023-06-16 NOTE — Telephone Encounter (Signed)
Note from Secure Chat this morning, 06/16/2023: Per Vara Guardian: patient is with Jani Files is seeing patient now and has a mass in abdomen and to not do the VQ scan right now believes he needs CT of abdomen done or maybe do them at the same time What would you want to do  Per Ames Dura: Still needs dopplers but we can change to regular priority and not stat Per Clyda Greener, CMA: OK. Are the orders still in for the dopplers from yesterday?  EW: I believe another provider ordered dopplers and they have already been done. We can cancel  AM: Is there anything else I can do at this time? EW Glenford Bayley, NP I dont think so, thanks Kinnie Kaupp AM: I will document all this in patients chart under the telephone contact from yesterday that I started and then close it out just for FYI. Thank you all!

## 2023-06-16 NOTE — Telephone Encounter (Signed)
Called and spoke with Darl Pikes. She stated that the patient was able to get his doppler done this morning, but for some reason his PCP also ordered one and this order was used instead. They were told that he was negative for a DVT.   While on the phone with Darl Pikes, she stated that they were currently at Dr. Laurey Morale office and the nurse had some concerns. She stated that the patient has a huge abdominal mass that is visible. Per the nurse, the mass was painful to touch for patient. Patient stated in the background that he has had some changes in his bowel movements, especially with constipation. His appetite has been stable. She was recommending that he go to the ER but wanted to see what Beth recommended.   Beth, can you please advise? Thanks!

## 2023-06-16 NOTE — Telephone Encounter (Signed)
Called and spoke with Darl Pikes. She verbalized understanding.   Nothing further needed at time of call.

## 2023-06-19 DIAGNOSIS — I480 Paroxysmal atrial fibrillation: Secondary | ICD-10-CM | POA: Diagnosis not present

## 2023-06-19 DIAGNOSIS — I82401 Acute embolism and thrombosis of unspecified deep veins of right lower extremity: Secondary | ICD-10-CM | POA: Diagnosis not present

## 2023-06-19 DIAGNOSIS — I5032 Chronic diastolic (congestive) heart failure: Secondary | ICD-10-CM | POA: Diagnosis not present

## 2023-06-19 DIAGNOSIS — I2699 Other pulmonary embolism without acute cor pulmonale: Secondary | ICD-10-CM | POA: Diagnosis not present

## 2023-06-19 DIAGNOSIS — J8282 Acute eosinophilic pneumonia: Secondary | ICD-10-CM | POA: Diagnosis not present

## 2023-06-19 DIAGNOSIS — I4892 Unspecified atrial flutter: Secondary | ICD-10-CM | POA: Diagnosis not present

## 2023-06-20 DIAGNOSIS — I2699 Other pulmonary embolism without acute cor pulmonale: Secondary | ICD-10-CM | POA: Diagnosis not present

## 2023-06-20 DIAGNOSIS — I82401 Acute embolism and thrombosis of unspecified deep veins of right lower extremity: Secondary | ICD-10-CM | POA: Diagnosis not present

## 2023-06-20 DIAGNOSIS — I5032 Chronic diastolic (congestive) heart failure: Secondary | ICD-10-CM | POA: Diagnosis not present

## 2023-06-20 DIAGNOSIS — I480 Paroxysmal atrial fibrillation: Secondary | ICD-10-CM | POA: Diagnosis not present

## 2023-06-20 DIAGNOSIS — J8282 Acute eosinophilic pneumonia: Secondary | ICD-10-CM | POA: Diagnosis not present

## 2023-06-20 DIAGNOSIS — I4892 Unspecified atrial flutter: Secondary | ICD-10-CM | POA: Diagnosis not present

## 2023-06-22 DIAGNOSIS — J8282 Acute eosinophilic pneumonia: Secondary | ICD-10-CM | POA: Diagnosis not present

## 2023-06-22 DIAGNOSIS — I5032 Chronic diastolic (congestive) heart failure: Secondary | ICD-10-CM | POA: Diagnosis not present

## 2023-06-22 DIAGNOSIS — I2699 Other pulmonary embolism without acute cor pulmonale: Secondary | ICD-10-CM | POA: Diagnosis not present

## 2023-06-22 DIAGNOSIS — I4892 Unspecified atrial flutter: Secondary | ICD-10-CM | POA: Diagnosis not present

## 2023-06-22 DIAGNOSIS — I82401 Acute embolism and thrombosis of unspecified deep veins of right lower extremity: Secondary | ICD-10-CM | POA: Diagnosis not present

## 2023-06-22 DIAGNOSIS — I480 Paroxysmal atrial fibrillation: Secondary | ICD-10-CM | POA: Diagnosis not present

## 2023-06-23 ENCOUNTER — Ambulatory Visit: Payer: Medicare Other | Admitting: Pulmonary Disease

## 2023-06-23 DIAGNOSIS — I5032 Chronic diastolic (congestive) heart failure: Secondary | ICD-10-CM | POA: Diagnosis not present

## 2023-06-23 DIAGNOSIS — I4892 Unspecified atrial flutter: Secondary | ICD-10-CM | POA: Diagnosis not present

## 2023-06-23 DIAGNOSIS — I2699 Other pulmonary embolism without acute cor pulmonale: Secondary | ICD-10-CM | POA: Diagnosis not present

## 2023-06-23 DIAGNOSIS — R3914 Feeling of incomplete bladder emptying: Secondary | ICD-10-CM | POA: Diagnosis not present

## 2023-06-23 DIAGNOSIS — I480 Paroxysmal atrial fibrillation: Secondary | ICD-10-CM | POA: Diagnosis not present

## 2023-06-23 DIAGNOSIS — I82401 Acute embolism and thrombosis of unspecified deep veins of right lower extremity: Secondary | ICD-10-CM | POA: Diagnosis not present

## 2023-06-23 DIAGNOSIS — J8282 Acute eosinophilic pneumonia: Secondary | ICD-10-CM | POA: Diagnosis not present

## 2023-06-26 ENCOUNTER — Encounter (HOSPITAL_COMMUNITY): Payer: Medicare Other

## 2023-06-26 ENCOUNTER — Telehealth: Payer: Self-pay | Admitting: Pulmonary Disease

## 2023-06-26 DIAGNOSIS — E871 Hypo-osmolality and hyponatremia: Secondary | ICD-10-CM | POA: Diagnosis not present

## 2023-06-26 DIAGNOSIS — R5382 Chronic fatigue, unspecified: Secondary | ICD-10-CM | POA: Diagnosis not present

## 2023-06-26 DIAGNOSIS — D72829 Elevated white blood cell count, unspecified: Secondary | ICD-10-CM | POA: Diagnosis not present

## 2023-06-26 DIAGNOSIS — D638 Anemia in other chronic diseases classified elsewhere: Secondary | ICD-10-CM | POA: Diagnosis not present

## 2023-06-26 DIAGNOSIS — I480 Paroxysmal atrial fibrillation: Secondary | ICD-10-CM | POA: Diagnosis not present

## 2023-06-26 DIAGNOSIS — K5900 Constipation, unspecified: Secondary | ICD-10-CM | POA: Diagnosis not present

## 2023-06-26 DIAGNOSIS — J9601 Acute respiratory failure with hypoxia: Secondary | ICD-10-CM | POA: Diagnosis not present

## 2023-06-26 DIAGNOSIS — N179 Acute kidney failure, unspecified: Secondary | ICD-10-CM | POA: Diagnosis not present

## 2023-06-27 ENCOUNTER — Ambulatory Visit (HOSPITAL_COMMUNITY): Payer: Medicare Other

## 2023-06-27 ENCOUNTER — Non-Acute Institutional Stay (HOSPITAL_COMMUNITY)
Admission: RE | Admit: 2023-06-27 | Discharge: 2023-06-27 | Disposition: A | Discharge status: Home / Self Care | Payer: Medicare Other | Source: Ambulatory Visit | Attending: Internal Medicine | Admitting: Internal Medicine

## 2023-06-27 VITALS — BP 114/57 | HR 89 | Temp 98.9°F | Resp 16

## 2023-06-27 DIAGNOSIS — D649 Anemia, unspecified: Secondary | ICD-10-CM | POA: Diagnosis not present

## 2023-06-27 LAB — TYPE AND SCREEN
ABO/RH(D): O POS
Antibody Screen: NEGATIVE
Unit division: 0
Unit division: 0

## 2023-06-27 LAB — PREPARE RBC (CROSSMATCH)

## 2023-06-27 LAB — BPAM RBC
Blood Product Expiration Date: 202408232359
Blood Product Expiration Date: 202408232359
ISSUE DATE / TIME: 202407301002
ISSUE DATE / TIME: 202407301002
Unit Type and Rh: 5100
Unit Type and Rh: 5100

## 2023-06-27 MED ORDER — FUROSEMIDE 10 MG/ML IJ SOLN
20.0000 mg | Freq: Once | INTRAMUSCULAR | Status: AC
  Administered 2023-06-27: 20 mg via INTRAVENOUS
  Filled 2023-06-27: qty 2

## 2023-06-27 MED ORDER — ACETAMINOPHEN 325 MG PO TABS
650.0000 mg | ORAL_TABLET | Freq: Once | ORAL | Status: AC
  Administered 2023-06-27: 650 mg via ORAL
  Filled 2023-06-27: qty 2

## 2023-06-27 MED ORDER — SODIUM CHLORIDE 0.9% IV SOLUTION: Freq: Once | INTRAVENOUS | Status: AC

## 2023-06-27 NOTE — Progress Notes (Signed)
[facility]. Patient reporting rectal pain and was wanting pain medication. Per wife, patient takes Tramadol at home and she would like patient to get a dose while here. Called and left message with Jacki Cones, Dr. Laurey Morale assistant. Jacki Cones called back and advised that the office needed to speak with patient's wife before pain medication could possibly be prescribed. Since patient is here for blood transfusion, additional medications unrelated to the transfusion are not usually prescribed.

## 2023-06-27 NOTE — Progress Notes (Signed)
[facility] NOTE  Diagnosis: Anemia   Provider: Kathaleen Maser NP  Procedure: 2 Units PRBCS  Note: Patient received 2 Units PRBCS. Type and Screen sent prior to transfusion, consent obtained and in patients chart. Pt pre-medicated with PO Tylenol per orders. Pt received IV Lasix 20 mg per orders in between units of blood. Pt tolerated infusion with no adverse reaction. AVS printed and given to pt. Pt is alert, oriented, and transferred in personal wheelchair at discharge. Pt discharge home with wife and aide.

## 2023-06-28 DIAGNOSIS — I480 Paroxysmal atrial fibrillation: Secondary | ICD-10-CM | POA: Diagnosis not present

## 2023-06-28 DIAGNOSIS — I82401 Acute embolism and thrombosis of unspecified deep veins of right lower extremity: Secondary | ICD-10-CM | POA: Diagnosis not present

## 2023-06-28 DIAGNOSIS — J8282 Acute eosinophilic pneumonia: Secondary | ICD-10-CM | POA: Diagnosis not present

## 2023-06-28 DIAGNOSIS — I4892 Unspecified atrial flutter: Secondary | ICD-10-CM | POA: Diagnosis not present

## 2023-06-28 DIAGNOSIS — I5032 Chronic diastolic (congestive) heart failure: Secondary | ICD-10-CM | POA: Diagnosis not present

## 2023-06-28 DIAGNOSIS — I2699 Other pulmonary embolism without acute cor pulmonale: Secondary | ICD-10-CM | POA: Diagnosis not present

## 2023-06-28 NOTE — Telephone Encounter (Signed)
Dr. Francine Graven and Dr. Marchelle Gearing please advise if your okay with switch?

## 2023-06-28 NOTE — Telephone Encounter (Signed)
Ok with me.   JD

## 2023-06-29 DIAGNOSIS — I480 Paroxysmal atrial fibrillation: Secondary | ICD-10-CM | POA: Diagnosis not present

## 2023-06-29 DIAGNOSIS — J8282 Acute eosinophilic pneumonia: Secondary | ICD-10-CM | POA: Diagnosis not present

## 2023-06-29 DIAGNOSIS — I4892 Unspecified atrial flutter: Secondary | ICD-10-CM | POA: Diagnosis not present

## 2023-06-29 DIAGNOSIS — I82401 Acute embolism and thrombosis of unspecified deep veins of right lower extremity: Secondary | ICD-10-CM | POA: Diagnosis not present

## 2023-06-29 DIAGNOSIS — I2699 Other pulmonary embolism without acute cor pulmonale: Secondary | ICD-10-CM | POA: Diagnosis not present

## 2023-06-29 DIAGNOSIS — I5032 Chronic diastolic (congestive) heart failure: Secondary | ICD-10-CM | POA: Diagnosis not present

## 2023-06-29 NOTE — Telephone Encounter (Signed)
This is fine. Wife is my patient though not see her in a while. How soon after CT can front desk gget him in . Copying scheduling. Need to see him within next 1-2 weeks. 30 min slot

## 2023-06-30 DIAGNOSIS — M353 Polymyalgia rheumatica: Secondary | ICD-10-CM | POA: Diagnosis not present

## 2023-06-30 DIAGNOSIS — D638 Anemia in other chronic diseases classified elsewhere: Secondary | ICD-10-CM | POA: Diagnosis not present

## 2023-06-30 DIAGNOSIS — K5900 Constipation, unspecified: Secondary | ICD-10-CM | POA: Diagnosis not present

## 2023-07-02 NOTE — Progress Notes (Unsigned)
Admit date:     01/24/2023  Discharge date: 01/25/2023  Principal Problem:   Fever Patient is a 89 year old male with past medical history of polymyalgia rheumatica on 5 mg of prednisone daily, hyperlipidemia and AAA who while on vacation last June (7 months ago) contracted a viral illness causing some upper respiratory symptoms.  At that time, patient not tested for any viral illnesses including COVID.  Soon after, this viral illness caused his PMR to flareup and patient's prednisone dosage increased from 5 mg to 25 mg (starting August 2023).  Patient's joint pain resolved sometime soon after.  However, since this viral illness, patient has had significant fatigue impacting his life. 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  Patient was seen by his rheumatologist on 2/21 and in attempt to wean down his prednisone, patient started on weekly methotrexate.  (Prednisone not yet weaned until methotrexate felt to be efficacious.)   Patient's wife noted that he has been having fevers that had started Saturday, 2/24.  Patient started first dose of weekly methotrexate Sunday, 2/25.  (Wife and patient are very clear as to when fevers are started and when methotrexate dose was first given, the day after fever started.)  Patient saw his PCP on 2/27 and given new fevers, there were concerns that patient in his steroid-induced immunosuppressed state may have endocarditis and was referred to the emergency room for further evaluation.  In the emergency room, patient noted to have normal lactic acid and a white blood cell count of 14.5 as well as a sed rate of 78.  Brought in for further evaluation.  Fever During patient's entire hospitalization, was not febrile.  Unclear etiology if he has any infection.  Chest x-ray and urinalysis unremarkable.  White blood cell count elevated, but mildly and in the context  of patient being on high-dose of steroids continuously.  Blood cultures drawn and patient was given 1 dose of cefepime and vancomycin.  Patient otherwise asymptomatic (other than complaints of fatigue).  Do not think patient has endocarditis.  He has no clinical signs.  Exam is unremarkable.   Additional possibility could be rheumatologic in nature?  Patient with elevated sed rate despite being on chronic high-dose steroids.  A new autoimmune inflammatory condition may cause a fever, although patient with no other symptoms.   No reason for additional antibiotics.  Will follow blood cultures and if positive will check echocardiogram.  Will plan to discharge.   Addendum: Blood cultures final result negative   Fatigue Had extensive discussion with patient and his wife about his fatigue.  I have a strong suspicion that he may have contracted COVID last summer and since then, has been suffering the effects of long COVID which have caused his fatigue.  (If this is indeed long COVID, discussed with patient and wife about new trials looking at treating long COVID successfully with SSRIs.  This may be something to consider as per patient's PCP.)   See below in regards to prednisone and methotrexate.   For completeness sake, ordered CMV, Epstein-Barr, [place] spotted fever and Lyme disease titers to look for other causes of fatigue.  (Addendum: Following discharge, these titers all came back negative.)   Polymyalgia rheumatica (HCC) Patient has been on 5 mg of prednisone for his polymyalgia rheumatica for some time.  The viral illness that he contracted last summer triggered a flareup causing joint  pain.  This was treated with increasing prednisone dose to 25 mg daily.   There appears to be some confusion in the efficacy of this treatment.  Patient's joint pain did indeed respond to elevated prednisone dose, however attempts to reduce prednisone dose led to worsening of fatigue.  Had extensive discussion  with patient and wife that the fatigue and joint pain are likely unrelated.  Patient probably felt worse with prednisone decreased as prednisone can often cause a feeling of increased energy and wakefulness.   There may have been confusion between patient and providers that patient unable to wean off prednisone due to failure of therapy.  Therefore, discontinuing methotrexate.  Weaning down prednisone very slowly over the next month to get patient back to 5 mg p.o. daily.  Fatigue appears to be independent and as above recommended treatment.  INPATIENT PULM COJSULT 04/06/23   Acute Hypoxic Respiratory Failure in setting possibly of multifocal pna vs eosinophilic pna versus organizing pneumonia Acute PE  PMR on chronic prednisone, immunocompromised  Patient remains on high flow nasal cannula oxygen, unable to titrated down currently Continue IV antibiotics, currently on vancomycin and cefepime, infectious disease following Started on high-dose steroid with Solu-Medrol 125 mg every 6 hours with suspected inflammatory lung disease Follow-up autoimmune panel Will try to get sputum culture and sputum pneumocystis PCR Ideally patient needs bronchoscopy with BAL and transbronchial biopsy but due to high oxygen requirement, will currently hold off Continue subcu Lovenox therapeutic dose Monitor intake and output  xxxxxxxxxxxxxxxxxxxxxxxxxxxxxxxxxxxxxxxxxxxxxxxxxxxxxxxxxxxxxxxxxxxxx Admit date: 03/05/2023  -Discharge date: 03/23/2023  [name] is a 89 y.o. male with medical history significant for polymyalgia rheumatica on chronic prednisone therapy, AAA status post endovascular stent graft in September 2018, chronic diastolic heart failure, recent diagnosis of aortitis, chronic leukocytosis, who is admitted to [facility] on 03/05/2023 with acute pulmonary embolism after presenting from home to [facility] ED complaining of generalized weakness.    [facility] end of [facility] 2024 for generalized weakness.  Subsequently, he was admitted to [facility] with similar initial complaint, before being discharged home on 02/13/2023.  During hospitalization at [facility], there was reportedly concern for potential aortitis, for which the patient was started on long-term current spectrum of antibiotics via PICC line, including daptomycin, Rocephin, and Bactrim.  He notes good interval compliance with these antibiotics.   However, over the last 1 to 2 days, he is noted progressive generalized weakness in the absence of any acute focal weakness.  He qualifies his generalized weakness is being significant enough to prevent him from getting out of bed over the last day, which is quite different than his baseline functionality and activity level.  Denies any associated acute focal weakness.    He has a history of polymyalgia rheumatica and has been on chronic steroid therapy for multiple decades.  Recently, there have been outpatient attempts at reducing his dose of chronic prednisone, and he notes that the most recent dose adjustment down to 15 mg of daily prednisone occurred a few days prior to the onset of his presenting generalized weakness.   He denies any recent subjective fever, chills, rigors, or generalized myalgias.  Denies any new headache, neck stiffness, rash, cough, hemoptysis, shortness of breath, abdominal pain, diarrhea, dysuria or gross hematuria.  He also denies any recent chest pain, palpitations, diaphoresis, nausea, vomiting, dizziness, presyncope, or syncope.  No recent worsening of peripheral edema, or any recent/new lower extremity erythema or calf  tenderness.  Denies any known history of prior pulmonary embolism.   Aside from the 2 hospitalizations over the last 6 weeks, denies any additional periods of prolonged diminished ambulatory activity as of recent.  No recent trauma.  No known history of underlying malignancy.  Not on any blood thinners as an  outpatient, including no aspirin.   Per chart review, it appears that he also has chronic leukocytosis with chart review revealing most recent prior white blood cell count of 18.8 on 02/28/2023, with other blood cell data points notable for 25.4 on 02/07/2023.   Denies any known baseline supplemental oxygen requirements.   ESR 105 compared to most recent prior value of 69 on 01/25/2023, CRP 12.8 compared to 75 on 02/28/2023. CBC notable for the following: Open cell count 20,000 with 89% neutroph     CTA chest, relative to CT chest from 12/02/2022, per formal radiology read, shows acute pulmonary embolism at the distal right main pulmonary artery extending into the right middle and lower pulmonary arteries, with CT evidence of right heart strain.  Additionally, CTA chest shows pulmonary parenchymal findings suggestive of multifocal pneumonia superimposed on a background of interstitial lung disease as well as small bilateral pleural effusions.  CTA chest also shows unchanged aneurysmal dilation of the ascending, transverse, and descending thoracic aorta, without any corresponding evidence of dissection.   EDP discussed the patient's case with the on-call infectious disease physician, Dr. Zenaida Niece dam, he felt that acute pneumonia was slightly less likely given the patient's broad-spectrum IV antibiotics over the last several weeks, including daptomycin, Rocephin, and Bactrim.  He did not recommend changing daptomycin to IV vancomycin, but rather recommended continuation of daptomycin.  He did however recommend consideration for escalation of Rocephin to cefepime, with no additional changes recommended per infectious disease at this tim    Hospital Course:  #1 acute hypoxic respiratory failure in the setting of multifocal pneumonia versus eosinophilic pneumonia from daptomycin versus interstitial lung disease/autoimmune process/acute PE with right lower extremity DVT in the setting of PMR on chronic  steroids/immunocompromise state -Patient was seen in consultation by ID, daptomycin discontinued on 03/05/2023 due to concerns for possible eosinophilic pneumonia. -Patient with improvement currently on 1 L nasal cannula with sats of 97% at rest. -Patient noted to have been placed on high dose steroids which are being tapered down patient currently on prednisone 40 mg daily recommended to continue for the next 3 to 4 weeks at this dose until seen by PCCM in the outpatient setting. -ID recommended continuation of IV Rocephin, oral doxycycline, Bactrim for PJP prophylaxis. -Patient completed course of antibiotics 03/23/2023. -ID recommending continuation of Bactrim for pneumocystis prophylaxis while on high-dose steroids. -Will need outpatient follow-up with ID at [facility] postdischarge.   2.  Paroxysmal atrial fibrillation/atrial flutter -Seen in consultation by cardiology and felt likely secondary to acute pulmonary issues. -Noted to have converted on IV amiodarone which was continued for 24 hours and subsequently discontinued. -Patient subsequently maintained on low-dose Lopressor for rate control, Eliquis for anticoagulation.    3.  PE/right lower extremity DVT -Noted on CT angiogram chest. -Patient noted to have elevated troponins were likely secondary to demand ischemia. -Lower extremity Dopplers done positive for right lower extremity DVT. -2D echo done with abnormal septal motion, EF 55 to 60%,NWMA, no evidence of cor pulmonale in the setting of recent PE. -Patient initially placed on IV heparin and subsequently transition to Eliquis.   -Patient improved clinically.   -Patient will be discharged on Eliquis.   -  Outpatient follow-up with pulmonary and PCP.    4.  AAA status post graft and possible aortitis -Patient noted to have been on daptomycin per ID at [facility] which was discontinued during this hospitalization due to concerns for eosinophilic pneumonia. -Patient received a full course of IV  Rocephin and IV doxycycline which was completed during the hospitalization on day of discharge.  -Patient also maintained on Bactrim for PJP prophylaxis per ID recommendations while on steroids.  Patient will be discharged on Bactrim for PJP prophylaxis. -Outpatient follow-up with ID at [facility].   5.  Acute delirium/anxiety -Possibly secondary to hospital delirium versus from hypoxia versus steroid-induced. -Improved clinically.      6.  PMR -Patient noted to chronically be on 5 mg of prednisone at home was on high-dose steroids and tapered down to 40 mg of prednisone daily secondary to problem #1 by day of discharge.  Patient be discharged on prednisone 40 mg daily until follow-up with pulmonary.     8.  Anemia of chronic disease -H&H stable. -No overt bleeding.   9. chronic leukocytosis -Patient noted to have a chronic leukocytosis could be secondary to steroid induced. -Labs with some improvement with leukocytosis by day of discharge.   -Patient completed course of antibiotics during the hospitalization, remained afebrile.   -Outpatient follow up with hematology for follow-up on leukocytosis.   -Ambulatory referral placed. -Patient will be discharged to a skilled nursing facility.   10.  GERD -Patient maintained on PPI, Pepcid, Zofran.         13.  Pressure injury, not present on admission Pressure Injury 03/12/23 Sacrum Mid Stage 1 -  Intact skin with non-blanchable redness of a localized area usually over a bony prominence. Pink non blanching on sacrum (Active)    Previous [facility] pulmonary encounter:  05/01/2023- Dr. Francine Graven     Chief Complaint  Patient presents with   Hospitalization Follow-up      HFU. Still using 4L of O2.     [name] is a 89 year old male with history of polymyalgia rheumatica on chronic prednisone and recent hospitalization at [facility] where he was diagnosed with aortitis and started on daptomycin and rocephin via PICC line on 3/14. He presented  to [facility] on 4/7 with generalized weakness and shortness of breath. CTA Chest on admission showed pulmonary embolism and bilateral patchy ground-glass and consolidative opacities with interlobular septal thickening superimposed on back ground subpleural reticulation and bronchiolectasis. Echocardiogram did not show RV strain. He was started on heparin drip. He required transfer to the ICU for acute hypoxemic respiratory failure with heated high flow oxygen requirements. He was started on pulse dose steroids and broad spectrum antibiotics. Bronchoscopy was not performed due to his significant oxygen requirements. He was transferred out of the ICU on 4/18 with decreasing O2 requirements. He was discharged on 40mg  of prednisone daily and bactrim prophylaxis. He was started on nystatinc for thrush. He was discharged on 1-2L of oxygen and went to rehab.    He started having bowel trouble at [facility] rehab for about a month with constipation. He has been started on aggressive bowel regimen and has required manual disimpaction per his wife. He had large explosive bowel movement in clinic today.    His prednisone was reduced to 20mg  daily at rehab due to concerns of intolerance of high dose therapy. He continues bactrim DS 1 tab 3 days per week.    He is working on walking with a walker, he walked  32 steps yesterday without assistance.    He has reduced appetite and is trying to drink protein supplements daily.     06/14/2023 - Interim hx Patient presents today for 1 month follow-up/acute office visit.  He was seen by Dr. Francine Graven on 05/01/2023 hospital follow-up due to respiratory failure in the setting of interstitial lung disease.  Patient has a history of Polymyalgia rheumatica on chronic prednisone.  Differential for respiratory failure includes eosinophilic pneumonia in the setting of daptomycin use versus progressive ILD process.  Patient has mild basilar reticulation on CT chest from March 2022.  Inflammatory  workup has been negative.  Follow-up chest x-ray on 05/01/2023 showed patchy bilateral interstitial and alveolar opacity bilaterally not significantly changed compared to prior chest x-ray from April 2024.    Accompanied by his wife and nursing aid today Breathing is about the same  He is on supplemental oxygen, for the most part he is using 1L oxygen at home  He is on 20mg  prednisone daily with Bactrim double strength 1 tablet 3 days/week for pneumocystitis prophylaxis.  Need to discuss prednisone taper if able    Patient had UTI symptoms (frequency/urgency, fever and confusion) on July 6. He had some air hunger at this time. No associated URI symptoms or cough. Prescribed abx Augmentin x 10 days and doxycycline x10 days. CXR done with Dr. Waynard Edwards, results are not accessible in Epic. He is doing better. He is more tired last several days. He is having some stomach/abdomen discomfort. He does struggle with constipation.  He had a small bowel movement today.  He has been compliant with stool softener.  OV 07/02/2023  Subjective:  Patient ID: [name], male , DOB: 1930/01/27 , age 76 y.o. , MRN: [id number] , ADDRESS: [address] [place] [address] PCP [name], MD Patient Care Team: [name], MD as PCP - General (Internal Medicine) Little Ishikawa, MD as PCP - Cardiology (Cardiology) Holli Humbles, MD as Referring Physician (Ophthalmology) Pryor Ochoa, MD (Inactive) as Consulting Physician (Vascular Surgery) Hilarie Fredrickson, MD as Consulting Physician (Gastroenterology)  This Provider for this visit: Treatment Team:  Attending Provider: Kalman Shan, MD    07/02/2023 -  No chief complaint on file.    HPI [name] 89 y.o. -    CT Chest data from date: ****  - personally visualized and independently interpreted : *** - my findings are: ***   PFT      No data to display            LAB RESULTS last 96 hours No results  found.  LAB RESULTS last 90 days Recent Results (from the past 2160 hour(s))  Comp Met (CMET)     Status: Abnormal   Collection Time: 06/14/23 12:17 PM  Result Value Ref Range   Sodium 136 135 - 145 mEq/L   Potassium 4.8 3.5 - 5.1 mEq/L   Chloride 101 96 - 112 mEq/L   CO2 27 19 - 32 mEq/L   Glucose, Bld 107 (H) 70 - 99 mg/dL   BUN 34 (H) 6 - 23 mg/dL   Creatinine, Ser 0.86 (H) 0.40 - 1.50 mg/dL   Total Bilirubin 0.3 0.2 - 1.2 mg/dL   Alkaline Phosphatase 77 39 - 117 U/L   AST 20 0 - 37 U/L   ALT 15 0 - 53 U/L   Total Protein 6.5 6.0 - 8.3 g/dL   Albumin 3.3 (L) 3.5 - 5.2 g/dL   GFR 57.84 (  L) >60.00 mL/min    Comment: Calculated using the CKD-EPI Creatinine Equation (2021)   Calcium 8.9 8.4 - 10.5 mg/dL  CBC with Differential     Status: Abnormal   Collection Time: 06/14/23 12:17 PM  Result Value Ref Range   WBC 14.7 (H) 4.0 - 10.5 K/uL   RBC 3.15 (L) 4.22 - 5.81 Mil/uL   Hemoglobin 8.1 Repeated and verified X2. (L) 13.0 - 17.0 g/dL   HCT 54.6 Repeated and verified X2. (L) 39.0 - 52.0 %   MCV 82.7 78.0 - 100.0 fl   MCHC 31.1 30.0 - 36.0 g/dL   RDW 27.0 (H) 35.0 - 09.3 %   Platelets 584.0 (H) 150.0 - 400.0 K/uL   Neutrophils Relative % 81.1 (H) 43.0 - 77.0 %   Lymphocytes Relative 10.2 (L) 12.0 - 46.0 %   Monocytes Relative 7.8 3.0 - 12.0 %   Eosinophils Relative 0.5 0.0 - 5.0 %   Basophils Relative 0.4 0.0 - 3.0 %   Neutro Abs 11.9 (H) 1.4 - 7.7 K/uL   Lymphs Abs 1.5 0.7 - 4.0 K/uL   Monocytes Absolute 1.1 (H) 0.1 - 1.0 K/uL   Eosinophils Absolute 0.1 0.0 - 0.7 K/uL   Basophils Absolute 0.1 0.0 - 0.1 K/uL  Sed Rate (ESR)     Status: Abnormal   Collection Time: 06/14/23 12:17 PM  Result Value Ref Range   Sed Rate 104 (H) 0 - 20 mm/hr  D-Dimer, Quantitative     Status: Abnormal   Collection Time: 06/14/23 12:17 PM  Result Value Ref Range   D-Dimer, Quant 1.63 (H) <0.50 mcg/mL FEU    Comment: . The D-Dimer test is used frequently to exclude an acute PE or DVT. In  patients with a low to moderate clinical risk assessment and a D-Dimer result <0.50 mcg/mL FEU, the likelihood of a PE or DVT is very low. However, a thromboembolic event should not be excluded solely on the basis of the D-Dimer level. Increased levels of D-Dimer are associated with a PE, DVT, DIC, malignancies, inflammation, sepsis, surgery, trauma, pregnancy, and advancing patient age. [Jama 2006 11:295(2):199-207] . For additional information, please refer to: http://education.questdiagnostics.com/faq/FAQ149 (This link is being provided for informational/ educational purposes only) .   Brain natriuretic peptide     Status: Abnormal   Collection Time: 06/14/23 12:17 PM  Result Value Ref Range   Pro B Natriuretic peptide (BNP) 243.0 (H) 0.0 - 100.0 pg/mL  Type and screen [facility]     Status: None   Collection Time: 06/27/23  8:25 AM  Result Value Ref Range   ABO/RH(D) O POS    Antibody Screen NEG    Sample Expiration 06/30/2023,2359    Unit Number [id number]    Blood Component Type RED CELLS,LR    Unit division 00    Status of Unit ISSUED,FINAL    Transfusion Status OK TO TRANSFUSE    Crossmatch Result Compatible    Unit Number [id number]    Blood Component Type RED CELLS,LR    Unit division 00    Status of Unit ISSUED,FINAL    Transfusion Status OK TO TRANSFUSE    Crossmatch Result      Compatible Performed at [facility], [address], [place], [place] [address]   Prepare RBC (crossmatch)     Status: None   Collection Time: 06/27/23  8:25 AM  Result Value Ref Range   Order Confirmation      ORDER PROCESSED BY  BLOOD BANK Performed at [facility], [address], [place], [place] [address]   BPAM RBC     Status: None   Collection Time: 06/27/23  8:25 AM  Result Value Ref Range   ISSUE DATE / TIME [id number]    Blood Product Unit Number [id number]    PRODUCT CODE [id number]    Unit Type and  Rh 5100    Blood Product Expiration Date 644034742595    ISSUE DATE / TIME [id number]    Blood Product Unit Number [id number]    PRODUCT CODE [id number]    Unit Type and Rh 5100    Blood Product Expiration Date 573220254270          has a past medical history of AAA (abdominal aortic aneurysm) (HCC), Abnormal EKG, Allergic rhinitis, Allergy, Benign neoplasm of colon (04/14/10), Carpal tunnel syndrome, Cataract, Degenerative disc disease, Disturbances metabolism of methionine, homocystine, and cystathionine (HCC), Elevated prostate specific antigen (PSA), Hearing loss, Hyperlipidemia, Impotence of organic origin, Internal hemorrhoids, Neck pain, Otosclerosis of both ears, Peripheral vascular disease (HCC), Plantar fasciitis, Polymyalgia rheumatica (HCC), Rotator cuff syndrome of left shoulder, Scoliosis, and Shoulder pain.   reports that he has never smoked. He has never used smokeless tobacco.  Past Surgical History:  Procedure Laterality Date   ABDOMINAL AORTIC ENDOVASCULAR STENT GRAFT N/A 08/16/2017   Procedure: ABDOMINAL AORTIC ENDOVASCULAR STENT GRAFT insertion;  Surgeon: Nada Libman, MD;  Location: [facility] OR;  Service: Vascular;  Laterality: N/A;   Actinic keratosis removal  06/21/2010   Left shoulder; Dr. 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   Procedure: DUPUYTREN CONTRACTURE RELEASE RIGHT LONG, RING AND SMALL FINGERS;  Surgeon: Wyn Forster., MD;  Location: [facility];  Service: Orthopedics;  Laterality: Right;   Implant penile pump  2001   IR ANGIOGRAM PELVIS SELECTIVE OR SUPRASELECTIVE  12/11/2020   IR ANGIOGRAM SELECTIVE EACH ADDITIONAL VESSEL  12/11/2020   IR EMBO ARTERIAL NOT HEMORR HEMANG INC GUIDE ROADMAPPING  12/11/2020   IR RADIOLOGIST EVAL & MGMT  07/30/2020   IR US GUIDE VASC ACCESS RIGHT  12/11/2020   Resection of appendix and tip of rectum  February 2005   STAPEDECTOMY Bilateral  1985, 1988   [employer]    Allergies  Allergen Reactions   Rosuvastatin Other (See Comments)    Stopped taking due to feeling achy     Codeine Nausea And Vomiting   Morphine And Codeine Nausea And Vomiting    Immunization History  Administered Date(s) Administered   Dtap, Unspecified 06/29/2010   Influenza, High Dose Seasonal PF 09/01/2017, 08/31/2022   Influenza, Quadrivalent, Recombinant, Inj, Pf 09/01/2018, 08/31/2019, 08/19/2020, 09/24/2021   Influenza,inj,Quad PF,6+ Mos 08/20/2013, 08/26/2014, 09/23/2015, 10/04/2016   Influenza-Unspecified 09/22/2006, 09/26/2007, 10/13/2012   PFIZER(Purple Top)SARS-COV-2 Vaccination 12/10/2019, 12/28/2019, 08/10/2020   Pneumococcal Conjugate-13 05/30/2018   Pneumococcal Polysaccharide-23 07/21/1998, 10/26/2022   Tdap 06/29/2010    Family History  Problem Relation Age of Onset   Heart disease Mother    Emphysema Mother    Leukemia Brother        Chronic lymphocytic leukemia   Colon cancer Neg Hx    Esophageal cancer Neg Hx    Rectal cancer Neg Hx    Stomach cancer Neg Hx      Current Outpatient Medications:    acetaminophen (TYLENOL) 325 MG tablet, Take 2 tablets (650 mg total) by mouth every 6 (six) hours  as needed for mild pain (or Fever >/= 101)., Disp: , Rfl:    amoxicillin-clavulanate (AUGMENTIN) 875-125 MG tablet, SMARTSIG:1 Tablet(s) By Mouth Every 12 Hours, Disp: , Rfl:    apixaban (ELIQUIS) 2.5 MG TABS tablet, Take 2.5 mg by mouth 2 (two) times daily., Disp: , Rfl:    aspirin 81 MG chewable tablet, Chew 1 tablet (81 mg total) by mouth daily., Disp: , Rfl:    bisacodyl 5 MG EC tablet, Take 10 mg by mouth daily as needed for moderate constipation., Disp: , Rfl:    butalbital-acetaminophen-caffeine (FIORICET) 50-325-40 MG tablet, Take 1 tablet by mouth daily as needed for headache., Disp: 14 tablet, Rfl: 0   cholecalciferol (VITAMIN D3) 25 MCG (1000 UNIT) tablet, Take 1,000 Units by mouth daily., Disp: , Rfl:    diclofenac  Sodium (VOLTAREN) 1 % GEL, Apply 2 g topically 4 (four) times daily as needed (shoulder pain)., Disp: 2 g, Rfl: 0   docusate sodium (COLACE) 100 MG capsule, Take 100 mg by mouth 2 (two) times daily., Disp: , Rfl:    doxycycline (VIBRAMYCIN) 100 MG capsule, Take 100 mg by mouth 2 (two) times daily., Disp: , Rfl:    famotidine (PEPCID) 20 MG tablet, Take 1 tablet (20 mg total) by mouth daily. (Patient not taking: Reported on 06/14/2023), Disp: , Rfl:    feeding supplement (ENSURE ENLIVE / ENSURE PLUS) LIQD, Take 237 mLs by mouth 3 (three) times daily between meals., Disp: 237 mL, Rfl: 12   ferrous sulfate 324 MG TBEC, Take 324 mg by mouth daily. As needed, Disp: , Rfl:    finasteride (PROSCAR) 5 MG tablet, Take 5 mg by mouth daily., Disp: , Rfl:    fish oil-omega-3 fatty acids 1000 MG capsule, Take 1,000 mg by mouth daily. , Disp: , Rfl:    fluorometholone (FML) 0.1 % ophthalmic suspension, Place 1 drop into the right eye at bedtime. , Disp: , Rfl:    folic acid (FOLVITE) 1 MG tablet, Take 1 mg by mouth daily., Disp: , Rfl:    L-Methylfolate-B12-B6-B2 (METAFOLBIC) 04-28-49-5 MG TABS, TAKE ONE TABLET TWICE DAILY (Patient taking differently: Take 1 tablet by mouth 2 (two) times daily.), Disp: 60 tablet, Rfl: 2   linaclotide (LINZESS) 72 MCG capsule, Take 72 mcg by mouth daily before breakfast., Disp: , Rfl:    LORazepam (ATIVAN) 0.5 MG tablet, Take 0.5 mg by mouth daily., Disp: , Rfl:    magnesium hydroxide (MILK OF MAGNESIA) 400 MG/5ML suspension, Take by mouth daily as needed for mild constipation., Disp: , Rfl:    melatonin 3 MG TABS tablet, Take 1 tablet (3 mg total) by mouth at bedtime. (Patient taking differently: Take 5 mg by mouth as needed.), Disp: , Rfl: 0   metoprolol succinate (TOPROL XL) 25 MG 24 hr tablet, Take 1 tablet (25 mg total) by mouth daily., Disp: 90 tablet, Rfl: 3   mirtazapine (REMERON) 15 MG tablet, Take 15 mg by mouth at bedtime., Disp: , Rfl:    Multiple Vitamins-Minerals  (MULTIVITAMIN PO), Take 1 tablet by mouth daily. , Disp: , Rfl:    pantoprazole (PROTONIX) 40 MG tablet, Take 1 tablet (40 mg total) by mouth 2 (two) times daily for 30 days, THEN 1 tablet (40 mg total) daily., Disp: 420 tablet, Rfl: 0   polyethylene glycol (MIRALAX / GLYCOLAX) 17 g packet, Take 17 g by mouth 2 (two) times daily., Disp: , Rfl:    predniSONE (DELTASONE) 20 MG tablet, Take 2 tablets (40 mg  total) by mouth daily with breakfast. (Patient taking differently: Take 40 mg by mouth daily with breakfast. 20mg  daily), Disp: , Rfl:    senna-docusate (SENOKOT-S) 8.6-50 MG tablet, Take 1 tablet by mouth 2 (two) times daily., Disp: , Rfl:    sulfamethoxazole-trimethoprim (BACTRIM DS) 800-160 MG tablet, Take 1 tablet by mouth 3 (three) times a week. Monday, Wednesday, Friday (Patient taking differently: Take 1 tablet by mouth 3 (three) times a week. Monday, Wednesday, Friday for PJP prophylaxis), Disp: , Rfl:    tamsulosin (FLOMAX) 0.4 MG CAPS capsule, Take 0.4 mg by mouth at bedtime., Disp: , Rfl:    Tart Cherry 1200 MG CAPS, Take 1 tablet by mouth daily., Disp: , Rfl:    traMADol (ULTRAM) 50 MG tablet, Take 50 mg by mouth every 4 (four) hours as needed for moderate pain or severe pain., Disp: , Rfl:       Objective:   There were no vitals filed for this visit.  Estimated body mass index is 18.99 kg/m as calculated from the following:   Height as of 06/14/23: 6' (1.829 m).   Weight as of 06/14/23: 63.5 kg.  @WEIGHTCHANGE @  There were no vitals filed for this visit.   Physical Exam   General: No distress. *** O2 at rest: *** Cane present: *** Sitting in wheel chair: *** Frail: *** Obese: *** Neuro: Alert and Oriented x 3. GCS 15. Speech normal Psych: Pleasant Resp:  Barrel Chest - ***.  Wheeze - ***, Crackles - ***, No overt respiratory distress CVS: Normal heart sounds. Murmurs - *** Ext: Stigmata of Connective Tissue Disease - *** HEENT: Normal upper airway. PEERL +. No post  nasal drip        Assessment:     No diagnosis found.     Plan:     There are no Patient Instructions on file for this visit.   FOLLOWUP No follow-ups on file.    SIGNATURE    Dr. Kalman Shan, M.D., F.C.C.P,  Pulmonary and Critical Care Medicine Staff Physician, [facility] Center Director - Interstitial Lung Disease  Program  Pulmonary Fibrosis [facility] at [facility] [place], [place], [address]  Pager: [phone], If no answer or between  15:00h - 7:00h: call [phone] Telephone: [phone]  10:49 AM 07/02/2023   Moderate Complexity MDM OFFICE  2021 E/M guidelines, first released in 2021, with minor revisions added in 2023 and 2024 Must meet the requirements for 2 out of 3 dimensions to qualify.    Number and complexity of problems addressed Amount and/or complexity of data reviewed Risk of complications and/or morbidity  One or more chronic illness with mild exacerbation, OR progression, OR  side effects of treatment  Two or more stable chronic illnesses  One undiagnosed new problem with uncertain prognosis  One acute illness with systemic symptoms   One Acute complicated injury Must meet the requirements for 1 of 3 of the categories)  Category 1: Tests and documents, historian  Any combination of 3 of the following:  Assessment requiring an independent historian  Review of prior external note(s) from each unique source  Review of results of each unique test  Ordering of each unique test    Category 2: Interpretation of tests   Independent interpretation of a test performed by another physician/other qualified health care professional (not separately reported)  Category 3: Discuss management/tests  Discussion of management or test interpretation with external physician/other qualified  health care professional/appropriate source (not separately reported) Moderate risk of morbidity from  additional diagnostic testing or treatment Examples only:  Prescription drug management  Decision regarding minor surgery with identfied patient or procedure risk factors  Decision regarding elective major surgery without identified patient or procedure risk factors  Diagnosis or treatment significantly limited by social determinants of health             HIGh Complexity  OFFICE   2021 E/M guidelines, first released in 2021, with minor revisions added in 2023. Must meet the requirements for 2 out of 3 dimensions to qualify.    Number and complexity of problems addressed Amount and/or complexity of data reviewed Risk of complications and/or morbidity  Severe exacerbation of chronic illness  Acute or chronic illnesses that may pose a threat to life or bodily function, e.g., multiple trauma, acute MI, pulmonary embolus, severe respiratory distress, progressive rheumatoid arthritis, psychiatric illness with potential threat to self or others, peritonitis, acute renal failure, abrupt change in neurological status Must meet the requirements for 2 of 3 of the categories)  Category 1: Tests and documents, historian  Any combination of 3 of the following:  Assessment requiring an independent historian  Review of prior external note(s) from each unique source  Review of results of each unique test  Ordering of each unique test    Category 2: Interpretation of tests    Independent interpretation of a test performed by another physician/other qualified health care professional (not separately reported)  Category 3: Discuss management/tests  Discussion of management or test interpretation with external physician/other qualified health care professional/appropriate source (not separately reported)  HIGH risk of morbidity from additional diagnostic testing or treatment Examples only:  Drug therapy requiring intensive monitoring for toxicity  Decision for elective major  surgery with identified pateint or procedure risk factors  Decision regarding hospitalization or escalation of level of care  Decision for DNR or to de-escalate care   Parenteral controlled  substances            LEGEND - Independent interpretation involves the interpretation of a test for which there is a CPT code, and an interpretation or report is customary. When a review and interpretation of a test is performed and documented by the provider, but not separately reported (billed), then this would represent an independent interpretation. This report does not need to conform to the usual standards of a complete report of the test. This does not include interpretation of tests that do not have formal reports such as a complete blood count with differential and blood cultures. Examples would include reviewing a chest radiograph and documenting in the medical record an interpretation, but not separately reporting (billing) the interpretation of the chest radiograph.   An appropriate source includes professionals who are not health care professionals but may be involved in the management of the patient, such as a Clinical research associate, upper officer, case manager or teacher, and does not include discussion with family or informal caregivers.    - SDOH: SDOH are the conditions in the environments where people are born, live, learn, work, play, worship, and age that affect a wide range of health, functioning, and quality-of-life outcomes and risks. (e.g., housing, food insecurity, transportation, etc.). SDOH-related Z codes ranging from Z55-Z65 are the ICD-10-CM diagnosis codes used to document SDOH data Z55 - Problems related to education and literacy Z56 - Problems related to employment and unemployment Z57 - Occupational exposure to risk factors Z58 - Problems related to physical  environment Z59 - Problems related to housing and economic circumstances [phone] - Problems related to social  environment [phone] - Problems related to upbringing [phone] - Other problems related to primary support group, including family circumstances Z92 - Problems related to certain psychosocial circumstances Z65 - Problems related to other psychosocial circumstances

## 2023-07-03 ENCOUNTER — Ambulatory Visit (HOSPITAL_BASED_OUTPATIENT_CLINIC_OR_DEPARTMENT_OTHER): Payer: Medicare Other

## 2023-07-03 ENCOUNTER — Encounter: Payer: Self-pay | Admitting: Internal Medicine

## 2023-07-03 ENCOUNTER — Ambulatory Visit (INDEPENDENT_AMBULATORY_CARE_PROVIDER_SITE_OTHER): Payer: Medicare Other | Admitting: Internal Medicine

## 2023-07-03 VITALS — BP 90/50 | HR 52 | Ht 72.0 in | Wt 146.2 lb

## 2023-07-03 DIAGNOSIS — G4700 Insomnia, unspecified: Secondary | ICD-10-CM | POA: Diagnosis not present

## 2023-07-03 DIAGNOSIS — R338 Other retention of urine: Secondary | ICD-10-CM | POA: Diagnosis not present

## 2023-07-03 DIAGNOSIS — M720 Palmar fascial fibromatosis [Dupuytren]: Secondary | ICD-10-CM | POA: Diagnosis not present

## 2023-07-03 DIAGNOSIS — R32 Unspecified urinary incontinence: Secondary | ICD-10-CM

## 2023-07-03 DIAGNOSIS — E785 Hyperlipidemia, unspecified: Secondary | ICD-10-CM | POA: Diagnosis not present

## 2023-07-03 DIAGNOSIS — I2699 Other pulmonary embolism without acute cor pulmonale: Secondary | ICD-10-CM | POA: Diagnosis not present

## 2023-07-03 DIAGNOSIS — K219 Gastro-esophageal reflux disease without esophagitis: Secondary | ICD-10-CM | POA: Diagnosis not present

## 2023-07-03 DIAGNOSIS — I5032 Chronic diastolic (congestive) heart failure: Secondary | ICD-10-CM | POA: Diagnosis not present

## 2023-07-03 DIAGNOSIS — M19042 Primary osteoarthritis, left hand: Secondary | ICD-10-CM | POA: Diagnosis not present

## 2023-07-03 DIAGNOSIS — J849 Interstitial pulmonary disease, unspecified: Secondary | ICD-10-CM | POA: Diagnosis not present

## 2023-07-03 DIAGNOSIS — F419 Anxiety disorder, unspecified: Secondary | ICD-10-CM | POA: Diagnosis not present

## 2023-07-03 DIAGNOSIS — Z7982 Long term (current) use of aspirin: Secondary | ICD-10-CM | POA: Diagnosis not present

## 2023-07-03 DIAGNOSIS — Z7952 Long term (current) use of systemic steroids: Secondary | ICD-10-CM | POA: Diagnosis not present

## 2023-07-03 DIAGNOSIS — D729 Disorder of white blood cells, unspecified: Secondary | ICD-10-CM

## 2023-07-03 DIAGNOSIS — R700 Elevated erythrocyte sedimentation rate: Secondary | ICD-10-CM | POA: Diagnosis not present

## 2023-07-03 DIAGNOSIS — D649 Anemia, unspecified: Secondary | ICD-10-CM

## 2023-07-03 DIAGNOSIS — I82401 Acute embolism and thrombosis of unspecified deep veins of right lower extremity: Secondary | ICD-10-CM | POA: Diagnosis not present

## 2023-07-03 DIAGNOSIS — E559 Vitamin D deficiency, unspecified: Secondary | ICD-10-CM | POA: Diagnosis not present

## 2023-07-03 DIAGNOSIS — N138 Other obstructive and reflux uropathy: Secondary | ICD-10-CM | POA: Diagnosis not present

## 2023-07-03 DIAGNOSIS — R350 Frequency of micturition: Secondary | ICD-10-CM | POA: Diagnosis not present

## 2023-07-03 DIAGNOSIS — R5381 Other malaise: Secondary | ICD-10-CM

## 2023-07-03 DIAGNOSIS — R5383 Other fatigue: Secondary | ICD-10-CM | POA: Diagnosis not present

## 2023-07-03 DIAGNOSIS — I4892 Unspecified atrial flutter: Secondary | ICD-10-CM | POA: Diagnosis not present

## 2023-07-03 DIAGNOSIS — H17829 Peripheral opacity of cornea, unspecified eye: Secondary | ICD-10-CM | POA: Diagnosis not present

## 2023-07-03 DIAGNOSIS — R972 Elevated prostate specific antigen [PSA]: Secondary | ICD-10-CM | POA: Diagnosis not present

## 2023-07-03 DIAGNOSIS — L89151 Pressure ulcer of sacral region, stage 1: Secondary | ICD-10-CM | POA: Diagnosis not present

## 2023-07-03 DIAGNOSIS — N401 Enlarged prostate with lower urinary tract symptoms: Secondary | ICD-10-CM | POA: Diagnosis not present

## 2023-07-03 DIAGNOSIS — H01009 Unspecified blepharitis unspecified eye, unspecified eyelid: Secondary | ICD-10-CM | POA: Diagnosis not present

## 2023-07-03 DIAGNOSIS — J8282 Acute eosinophilic pneumonia: Secondary | ICD-10-CM | POA: Diagnosis not present

## 2023-07-03 DIAGNOSIS — R0609 Other forms of dyspnea: Secondary | ICD-10-CM

## 2023-07-03 DIAGNOSIS — I480 Paroxysmal atrial fibrillation: Secondary | ICD-10-CM | POA: Diagnosis not present

## 2023-07-03 DIAGNOSIS — M353 Polymyalgia rheumatica: Secondary | ICD-10-CM | POA: Diagnosis not present

## 2023-07-03 LAB — CBC WITH DIFFERENTIAL/PLATELET
Basophils Absolute: 0.1 10*3/uL (ref 0.0–0.1)
Basophils Relative: 0.3 % (ref 0.0–3.0)
Eosinophils Absolute: 0.1 10*3/uL (ref 0.0–0.7)
Eosinophils Relative: 0.4 % (ref 0.0–5.0)
HCT: 31.9 % — ABNORMAL LOW (ref 39.0–52.0)
Hemoglobin: 9.9 g/dL — ABNORMAL LOW (ref 13.0–17.0)
Lymphocytes Relative: 6.9 % — ABNORMAL LOW (ref 12.0–46.0)
Lymphs Abs: 1.4 10*3/uL (ref 0.7–4.0)
MCHC: 31.2 g/dL (ref 30.0–36.0)
MCV: 82.6 fl (ref 78.0–100.0)
Monocytes Absolute: 1.4 10*3/uL — ABNORMAL HIGH (ref 0.1–1.0)
Monocytes Relative: 6.8 % (ref 3.0–12.0)
Neutro Abs: 17.5 10*3/uL — ABNORMAL HIGH (ref 1.4–7.7)
Neutrophils Relative %: 85.6 % — ABNORMAL HIGH (ref 43.0–77.0)
Platelets: 566 10*3/uL — ABNORMAL HIGH (ref 150.0–400.0)
RBC: 3.86 Mil/uL — ABNORMAL LOW (ref 4.22–5.81)
RDW: 19.9 % — ABNORMAL HIGH (ref 11.5–15.5)
WBC: 20.5 10*3/uL (ref 4.0–10.5)

## 2023-07-03 LAB — BASIC METABOLIC PANEL
BUN: 23 mg/dL (ref 6–23)
CO2: 29 mEq/L (ref 19–32)
Calcium: 9.2 mg/dL (ref 8.4–10.5)
Chloride: 99 mEq/L (ref 96–112)
Creatinine, Ser: 0.97 mg/dL (ref 0.40–1.50)
GFR: 68.68 mL/min (ref >60.00)
Glucose, Bld: 121 mg/dL — ABNORMAL HIGH (ref 70–99)
Potassium: 4.5 mEq/L (ref 3.5–5.1)
Sodium: 139 mEq/L (ref 135–145)

## 2023-07-03 LAB — BRAIN NATRIURETIC PEPTIDE: Pro B Natriuretic peptide (BNP): 230 pg/mL — ABNORMAL HIGH (ref 0.0–100.0)

## 2023-07-03 LAB — SEDIMENTATION RATE: Sed Rate: 114 mm/hr — ABNORMAL HIGH (ref 0–20)

## 2023-07-03 LAB — CK: Total CK: 19 U/L (ref 7–232)

## 2023-07-03 LAB — PSA: PSA: 6.85 ng/mL — ABNORMAL HIGH (ref 0.10–4.00)

## 2023-07-03 NOTE — Patient Instructions (Addendum)
ICD-10-CM   1. Interstitial lung disease (HCC)  J84.9     2. Physical deconditioning  R53.81      Interstitial lung disease (HCC) DOE  - you have inflammation in lung since 2021/2022: This flared up in April 2024 because of the PE and likely daptomycin. Possible covid last year played a role. Currently improved and needing room air to 1L at rest and night 1L at night  Plan  - continue prednisone 15mg  per day for now - await CT scan later this week - too high risk for lung biopsy - too high risk for anti-fibrotics  - increae o2 to 2L [place] at night and see if this helps fatigue  Physical deconditioning Fatigue  This is multifactorial. THis is area to focus on most  Plan  - check cbc, bmet, CK, BNP - check ESR  - check quantiferon gold blood test  -check acteylcholine receptor antibody for myasthenia gravis  - check PSA - keep o2 levels > 88% - continue PT   Followup  - based on CT results, I will call

## 2023-07-04 ENCOUNTER — Telehealth: Payer: Self-pay | Admitting: Internal Medicine

## 2023-07-04 DIAGNOSIS — J8282 Acute eosinophilic pneumonia: Secondary | ICD-10-CM | POA: Diagnosis not present

## 2023-07-04 DIAGNOSIS — I5032 Chronic diastolic (congestive) heart failure: Secondary | ICD-10-CM | POA: Diagnosis not present

## 2023-07-04 DIAGNOSIS — I4892 Unspecified atrial flutter: Secondary | ICD-10-CM | POA: Diagnosis not present

## 2023-07-04 DIAGNOSIS — I82401 Acute embolism and thrombosis of unspecified deep veins of right lower extremity: Secondary | ICD-10-CM | POA: Diagnosis not present

## 2023-07-04 DIAGNOSIS — I480 Paroxysmal atrial fibrillation: Secondary | ICD-10-CM | POA: Diagnosis not present

## 2023-07-04 DIAGNOSIS — R3914 Feeling of incomplete bladder emptying: Secondary | ICD-10-CM | POA: Diagnosis not present

## 2023-07-04 DIAGNOSIS — I2699 Other pulmonary embolism without acute cor pulmonale: Secondary | ICD-10-CM | POA: Diagnosis not present

## 2023-07-04 NOTE — Telephone Encounter (Signed)
Front desk   [name] -please give video visit next week to see me.  Wife will be giving most of the history and the caretaker needs to be there as well.  This is to discuss results and next steps

## 2023-07-04 NOTE — Progress Notes (Signed)
[facility] [phone]   [phone]  INITIAL CONSULT NOTE  Patient Care Team: [name], MD as PCP - General (Internal Medicine) Little Ishikawa, MD as PCP - Cardiology (Cardiology) Holli Humbles, MD as Referring Physician (Ophthalmology) Pryor Ochoa, MD (Inactive) as Consulting Physician (Vascular Surgery) Hilarie Fredrickson, MD as Consulting Physician (Gastroenterology)   CHIEF COMPLAINTS/PURPOSE OF CONSULTATION:  Leukocytosis Normocytic Anemia Thrombocytosis  HISTORY OF PRESENTING ILLNESS:  [name] 89 y.o. male with medical history significant for polymyalgia rheumatica, AAA, carpal tunnel syndrome, DDD, hyperlipidemia and scoliosis. He presents today for a consultation for leukocytosis, normocytic anemia and thrombocytosis. He is accompanied by his wife and friend for this visit.   On review of the previous records, there is persistent leukocytosis and normocytic anemia for the last several months. Most recent labs from 07/03/2023 showed WBC 20.5, ANC 17.5, Hgb 9.9, MCV 82.6, Plt 566K, sed rate of 114.   On exam today, [name] is struggling with persistent fatigue that impacts his ADLs. He requires a wheelchair to help with ambulation. He reports dyspnea on exertion and requires 1-2 L of supplemental oxygen. He is struggling with a pressure ulcer that causes discomfort with sitting for long period of time. He reports constipation with a bowel movement every few days. He did have nausea and diarrhea when he was taking antibiotics last week which resulted in discontinuing the medication.He denies any signs of overt bleeding but was told he had positive stool guac test last week. He denies fevers, chills, sweats, chest pain or cough. He has no other complaints. Rest of the ROS is below.  MEDICAL HISTORY:  Past Medical History:  Diagnosis Date   AAA (abdominal aortic aneurysm) (HCC)    Abnormal EKG    Left atrial abnormality    Allergic rhinitis    Allergy    Benign neoplasm of colon 04/14/2010   3 small polyps on colonoscopy by Dr. Marina Goodell   Carpal tunnel syndrome    Cataract    Decubitus ulcer of coccygeal region, stage 2 (HCC) 07/06/2023   Degenerative disc disease    Disturbances metabolism of methionine, homocystine, and cystathionine (HCC)    Elevated homocysteine   Elevated prostate specific antigen (PSA)    Elevated PSA 07/06/2023   Hearing loss    Hyperlipidemia    ILD (interstitial lung disease) (HCC) 07/06/2023   Impotence of organic origin    Penile implant   Internal hemorrhoids    Leukocytosis 07/06/2023   Neck pain    Otosclerosis of both ears    Peripheral vascular disease (HCC)    Bilateral femoral bruit   Plantar fasciitis    Polymyalgia rheumatica (HCC)    Polymyalgia rheumatica (HCC) 07/06/2023   Rotator cuff syndrome of left shoulder    Scoliosis    Shoulder pain     SURGICAL HISTORY: Past Surgical History:  Procedure Laterality Date   ABDOMINAL AORTIC ENDOVASCULAR STENT GRAFT N/A 08/16/2017   Procedure: ABDOMINAL AORTIC ENDOVASCULAR STENT GRAFT insertion;  Surgeon: Nada Libman, MD;  Location: [facility] OR;  Service: Vascular;  Laterality: N/A;   Actinic keratosis removal  06/21/2010   Left shoulder; Dr.  Limbo   COLONOSCOPY     COLONOSCOPY W/ POLYPECTOMY     DUPUYTREN CONTRACTURE RELEASE Right 12/05/2013   Procedure: DUPUYTREN CONTRACTURE RELEASE RIGHT LONG, RING AND SMALL FINGERS;  Surgeon: Wyn Forster., MD;  Location: [facility];  Service: Orthopedics;  Laterality: Right;   Implant penile  pump  2001   IR ANGIOGRAM PELVIS SELECTIVE OR SUPRASELECTIVE  12/11/2020   IR ANGIOGRAM SELECTIVE EACH ADDITIONAL VESSEL  12/11/2020   IR EMBO ARTERIAL NOT HEMORR HEMANG INC GUIDE ROADMAPPING  12/11/2020   IR RADIOLOGIST EVAL & MGMT  07/30/2020   IR US GUIDE VASC ACCESS RIGHT  12/11/2020   Resection of appendix and tip of rectum  February 2005   STAPEDECTOMY Bilateral  1985, 1988   [employer]    SOCIAL HISTORY: Social History   Socioeconomic History   Marital status: Married    Spouse name: Onyekachi Slee   Number of children: 2   Years of education: Not on file   Highest education level: Not on file  Occupational History   Occupation: Dealer: [employer]  Tobacco Use   Smoking status: Never   Smokeless tobacco: Never  Vaping Use   Vaping status: Never Used  Substance and Sexual Activity   Alcohol use: No    Alcohol/week: 0.0 standard drinks of alcohol   Drug use: No   Sexual activity: Not Currently  Other Topics Concern   Not on file  Social History Narrative   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   Social Determinants of Health   Financial Resource Strain: Low Risk  (03/07/2023)   Overall Financial Resource Strain (CARDIA)    Difficulty of Paying Living Expenses: Not hard at all  Food Insecurity: No Food Insecurity (03/15/2023)   Hunger Vital Sign    Worried About Running Out of Food in the Last Year: Never true    Ran Out of Food in the Last Year: Never true  Transportation Needs: No Transportation Needs (03/15/2023)   PRAPARE - Administrator, Civil Service (Medical): No    Lack of Transportation (Non-Medical): No  Physical Activity: Not on file  Stress: Not on file  Social Connections: Not on file  Intimate Partner Violence: Not At Risk (03/15/2023)   Humiliation, Afraid, Rape, and Kick questionnaire    Fear of Current or Ex-Partner: No    Emotionally Abused: No    Physically Abused: No    Sexually Abused: No    FAMILY HISTORY: Family History  Problem Relation Age of Onset   Heart disease Mother    Emphysema Mother    Leukemia Brother        Chronic lymphocytic leukemia   Colon cancer Neg Hx    Esophageal cancer Neg Hx    Rectal cancer Neg Hx    Stomach cancer Neg Hx     ALLERGIES:  is allergic to daptomycin,  rosuvastatin, codeine, morphine and codeine, and omnicef [cefdinir].  MEDICATIONS:  Current Outpatient Medications  Medication Sig Dispense Refill   acetaminophen (TYLENOL) 325 MG tablet Take 2 tablets (650 mg total) by mouth every 6 (six) hours as needed for mild pain (or Fever >/= 101).     apixaban (ELIQUIS) 2.5 MG TABS tablet Take 2.5 mg by mouth 2 (two) times daily.     bisacodyl 5 MG EC tablet Take 10 mg by mouth daily as needed for moderate constipation.     butalbital-acetaminophen-caffeine (FIORICET) 50-325-40 MG tablet Take 1 tablet by mouth daily as needed for headache. 14 tablet 0   cholecalciferol (VITAMIN D3) 25 MCG (1000 UNIT) tablet Take 1,000 Units by mouth daily.     diclofenac Sodium (VOLTAREN) 1 % GEL Apply 2 g topically 4 (four) times daily as  needed (shoulder pain). 2 g 0   docusate sodium (COLACE) 100 MG capsule Take 100 mg by mouth 2 (two) times daily.     doxycycline (VIBRAMYCIN) 100 MG capsule Take 100 mg by mouth 2 (two) times daily. (Patient not taking: Reported on 07/06/2023)     feeding supplement (ENSURE ENLIVE / ENSURE PLUS) LIQD Take 237 mLs by mouth 3 (three) times daily between meals. 237 mL 12   ferrous sulfate 324 MG TBEC Take 324 mg by mouth daily. As needed     finasteride (PROSCAR) 5 MG tablet Take 5 mg by mouth daily.     fish oil-omega-3 fatty acids 1000 MG capsule Take 1,000 mg by mouth daily.      fluorometholone (FML) 0.1 % ophthalmic suspension Place 1 drop into the right eye at bedtime.      L-Methylfolate-B12-B6-B2 (METAFOLBIC) 04-28-49-5 MG TABS TAKE ONE TABLET TWICE DAILY (Patient taking differently: Take 1 tablet by mouth 2 (two) times daily.) 60 tablet 2   LORazepam (ATIVAN) 0.5 MG tablet Take 0.5 mg by mouth daily. 0.25 am, 0.25 pm,  0.5 at night     magnesium hydroxide (MILK OF MAGNESIA) 400 MG/5ML suspension Take by mouth daily as needed for mild constipation.     melatonin 3 MG TABS tablet Take 1 tablet (3 mg total) by mouth at bedtime.  (Patient taking differently: Take 5 mg by mouth as needed.)  0   metoprolol succinate (TOPROL XL) 25 MG 24 hr tablet Take 1 tablet (25 mg total) by mouth daily. 90 tablet 3   mirtazapine (REMERON) 15 MG tablet Take 15 mg by mouth at bedtime. 22.5 mg     Multiple Vitamins-Minerals (MULTIVITAMIN PO) Take 1 tablet by mouth daily.      pantoprazole (PROTONIX) 40 MG tablet Take 1 tablet (40 mg total) by mouth 2 (two) times daily for 30 days, THEN 1 tablet (40 mg total) daily. 420 tablet 0   polyethylene glycol (MIRALAX / GLYCOLAX) 17 g packet Take 17 g by mouth 2 (two) times daily.     predniSONE (DELTASONE) 20 MG tablet Take 2 tablets (40 mg total) by mouth daily with breakfast. (Patient taking differently: Take 15 mg by mouth daily with breakfast. 15mg  daily)     senna-docusate (SENOKOT-S) 8.6-50 MG tablet Take 1 tablet by mouth 2 (two) times daily.     tamsulosin (FLOMAX) 0.4 MG CAPS capsule Take 0.4 mg by mouth at bedtime.     Tart Cherry 1200 MG CAPS Take 1 tablet by mouth daily.     traMADol (ULTRAM) 50 MG tablet Take 50 mg by mouth every 4 (four) hours as needed for moderate pain or severe pain.     zolpidem (AMBIEN) 10 MG tablet Take 5 mg by mouth at bedtime as needed for sleep.     amoxicillin-clavulanate (AUGMENTIN) 875-125 MG tablet SMARTSIG:1 Tablet(s) By Mouth Every 12 Hours (Patient not taking: Reported on 07/03/2023)     aspirin 81 MG chewable tablet Chew 1 tablet (81 mg total) by mouth daily. (Patient not taking: Reported on 07/05/2023)     cefdinir (OMNICEF) 300 MG capsule Take 300 mg by mouth 2 (two) times daily. (Patient not taking: Reported on 07/05/2023)     famotidine (PEPCID) 20 MG tablet Take 1 tablet (20 mg total) by mouth daily. (Patient not taking: Reported on 06/14/2023)     folic acid (FOLVITE) 1 MG tablet Take 1 mg by mouth daily. (Patient not taking: Reported on 07/05/2023)  linaclotide (LINZESS) 72 MCG capsule Take 72 mcg by mouth daily before breakfast. (Patient not taking:  Reported on 07/03/2023)     sulfamethoxazole-trimethoprim (BACTRIM DS) 800-160 MG tablet Take 1 tablet by mouth 3 (three) times a week. Monday, Wednesday, Friday (Patient not taking: Reported on 07/03/2023)     No current facility-administered medications for this visit.    REVIEW OF SYSTEMS:   Constitutional: ( - ) fevers, ( - )  chills , ( - ) night sweats Eyes: ( - ) blurriness of vision, ( - ) double vision, ( - ) watery eyes Ears, nose, mouth, throat, and face: ( - ) mucositis, ( - ) sore throat Respiratory: ( - ) cough, ( + ) dyspnea, ( - ) wheezes Cardiovascular: ( - ) palpitation, ( - ) chest discomfort, ( - ) lower extremity swelling Gastrointestinal:  ( - ) nausea, ( - ) heartburn, ( - ) change in bowel habits Skin: ( - ) abnormal skin rashes Lymphatics: ( - ) new lymphadenopathy, ( - ) easy bruising Neurological: ( - ) numbness, ( - ) tingling, ( - ) new weaknesses Behavioral/Psych: ( - ) mood change, ( - ) new changes  All other systems were reviewed with the patient and are negative.  PHYSICAL EXAMINATION: ECOG PERFORMANCE STATUS: 2 - Symptomatic, <50% confined to bed  Vitals:   07/05/23 0931  BP: 99/62  Pulse: 98  Resp: 17  Temp: (!) 97.5 F (36.4 C)  SpO2: 98%   Filed Weights   07/05/23 0931  Weight: 147 lb (66.7 kg)    GENERAL: elderly appearing male. Patient is uncomfortable due to sacral pressure ulcer.  SKIN: skin color, texture, turgor are normal, no rashes or significant lesions EYES: conjunctiva are pink and non-injected, sclera clear LYMPH:  no palpable lymphadenopathy in the cervicaor supraclavicular lymph nodes.  LUNGS: clear to auscultation and percussion with normal breathing effort HEART: regular rate & rhythm and no murmur Musculoskeletal: no cyanosis of digits and no clubbing  PSYCH: alert & oriented x 3, fluent speech NEURO: no focal motor/sensory deficits  LABORATORY DATA:  I have reviewed the data as listed    Latest Ref Rng & Units  07/05/2023   10:28 AM 07/03/2023    9:49 AM 06/14/2023   12:17 PM  CBC  WBC 4.0 - 10.5 K/uL 17.4  20.5 Repeated and verified X2.  14.7   Hemoglobin 13.0 - 17.0 g/dL 9.4  9.9  8.1 Repeated and verified X2.   Hematocrit 39.0 - 52.0 % 29.8  31.9  26.0 Repeated and verified X2.   Platelets 150 - 400 K/uL 479  566.0  584.0        Latest Ref Rng & Units 07/05/2023   10:28 AM 07/03/2023    9:49 AM 06/14/2023   12:17 PM  CMP  Glucose 70 - 99 mg/dL 409  811  914   BUN 8 - 23 mg/dL 23  23  34   Creatinine 0.61 - 1.24 mg/dL 7.82  9.56  2.13   Sodium 135 - 145 mmol/L 138  139  136   Potassium 3.5 - 5.1 mmol/L 4.2  4.5  4.8   Chloride 98 - 111 mmol/L 101  99  101   CO2 22 - 32 mmol/L 30  29  27    Calcium 8.9 - 10.3 mg/dL 9.2  9.2  8.9   Total Protein 6.5 - 8.1 g/dL 6.8   6.5   Total Bilirubin 0.3 - 1.2 mg/dL 0.3  0.3   Alkaline Phos 38 - 126 U/L 80   77   AST 15 - 41 U/L 22   20   ALT 0 - 44 U/L 25   15      RADIOGRAPHIC STUDIES: I have personally reviewed the radiological images as listed and agreed with the findings in the report. VAS [place] LOWER EXTREMITY VENOUS (DVT)  Result Date: 06/19/2023  Lower Venous DVT Study Patient Name:  AMADI SIBAYAN [facility]  Date of Exam:   06/16/2023 Medical Rec #: [id number]       Accession #:    [id number] Date of Birth: June 01, 1930      Patient Gender: M Patient Age:   67 years Exam Location:  [facility] Procedure:      VAS [place] LOWER EXTREMITY VENOUS (DVT) Referring Phys: MARK PERINI --------------------------------------------------------------------------------  Indications: Left leg edema.  Performing Technologist: Dorthula Matas RVS, RCS  Examination Guidelines: A complete evaluation includes B-mode imaging, spectral Doppler, color Doppler, and power Doppler as needed of all accessible portions of each vessel. Bilateral testing is considered an integral part of a complete examination. Limited examinations for reoccurring indications may be performed as  noted. The reflux portion of the exam is performed with the patient in reverse Trendelenburg.  +---------+---------------+---------+-----------+----------+--------------+ LEFT     CompressibilityPhasicitySpontaneityPropertiesThrombus Aging +---------+---------------+---------+-----------+----------+--------------+ CFV      Full                                                        +---------+---------------+---------+-----------+----------+--------------+ FV Prox  Full                                                        +---------+---------------+---------+-----------+----------+--------------+ FV Mid   Full                                                        +---------+---------------+---------+-----------+----------+--------------+ FV DistalFull                                                        +---------+---------------+---------+-----------+----------+--------------+ POP      Full                                                        +---------+---------------+---------+-----------+----------+--------------+ PTV      Full                                                        +---------+---------------+---------+-----------+----------+--------------+ PERO  Full                                                        +---------+---------------+---------+-----------+----------+--------------+ SSV      Partial                                      Chronic        +---------+---------------+---------+-----------+----------+--------------+     Summary: RIGHT: - No evidence of common femoral vein obstruction.  LEFT: - Findings consistent with chronic superficial vein thrombosis involving the left small saphenous vein. - There is no evidence of deep vein thrombosis in the lower extremity. - Rouleaux flow observed involving the left common femoral vein.  *See table(s) above for measurements and observations. Electronically signed by Heath Lark on 06/19/2023 at 8:19:26 AM.    Final    CT ABDOMEN PELVIS W CONTRAST  Result Date: 06/16/2023 CLINICAL DATA:  Lower abdominal tenderness and swelling. Personal history of abdominal aortic aneurysm repair. EXAM: CT ABDOMEN AND PELVIS WITH CONTRAST TECHNIQUE: Multidetector CT imaging of the abdomen and pelvis was performed using the standard protocol following bolus administration of intravenous contrast. RADIATION DOSE REDUCTION: This exam was performed according to the departmental dose-optimization program which includes automated exposure control, adjustment of the mA and/or kV according to patient size and/or use of iterative reconstruction technique. CONTRAST:  80mL ISOVUE-300 IOPAMIDOL (ISOVUE-300) INJECTION 61% COMPARISON:  One-view abdomen 06/16/2023. CT angio abdomen and pelvis/13/24 FINDINGS: Lower chest: Peripheral fibrotic changes in architectural distortion is stable. Previous seen scratched at previously noted superimposed airspace disease has cleared. The heart mildly enlarged. Aortic valve calcifications present. Coronary artery calcifications are noted. Atherosclerotic changes are present in the aorta. A small left pleural effusion is again noted. Hepatobiliary: Multiple benign hepatic cysts are present. Liver is otherwise unremarkable. The common bile duct and gallbladder are normal. Pancreas: Unremarkable. No pancreatic ductal dilatation or surrounding inflammatory changes. Spleen: Normal in size without focal abnormality. Adrenals/Urinary Tract: The adrenal glands are normal bilaterally. A 3.5 cm simple cyst present in the upper pole of the right kidney. No follow-up imaging is recommended. JACR 2018 Feb; 264-273, Management of the Incidental Renal Mass on CT, Radio[employer] 2021; 814-848, Bosniak Classification of Cystic Renal Masses, Version 2019. Renal collecting systems are moderately dilated bilaterally. Ureters are distended. No obstructing lesion is present urinary bladder is  distended to the level of the umbilicus, measuring nearly 20 cm cephalo caudad. Stomach/Bowel: The stomach and duodenum are within normal limits. Small bowel is unremarkable. Terminal ileum is normal. The appendix is surgically absent moderate stool is present throughout the colon. No obstruction is present. No focal inflammatory changes are present. Vascular/Lymphatic: The excluded aneurysm sac is stable measuring 5.7 x 6.4 cm. Contrast is again noted in the posterior aspect of the excluded sac below the iliac graft bifurcation. Contrast is less prominent than on the prior exam. Reproductive: The prostate is enlarged and irregular, extending into the bladder base. It measures up to 6.3 cm cephalo caudad. Penile prosthesis is again noted. The reservoir in the right lower quadrant is stable. Other: No abdominal wall hernia or abnormality. No abdominopelvic ascites. Musculoskeletal: Multilevel degenerative changes are present in the lumbar spine. Vertebral body heights are maintained. No focal osseous  lesions are present. The bony pelvis is normal. The hips are located and within normal limits bilaterally. IMPRESSION: 1. Marked distention of the urinary bladder to the level of the umbilicus, measuring nearly 20 cm cephalo caudad. Probable bladder outlet obstruction with an enlarged irregular prostate gland. This is likely the source of the patient's lower abdominal pain. 2. Moderate dilation of the renal collecting systems and ureters bilaterally. 3. Enlarged and irregular prostate extending into the bladder base. 4. Stable size of excluded aneurysm sac measuring 5.7 x 6.4 cm. 5. Contrast in the posterior aspect of the excluded sac below the iliac graft bifurcation is less prominent than on the prior exam. 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 6. Stable small left pleural effusion. 7. Stable fibrotic changes in the lung bases. 8. Coronary artery disease. 9. Stable benign hepatic cysts. 10.  Aortic  Atherosclerosis (ICD10-I70.0). Electronically Signed   By: Marin Roberts M.D.   On: 06/16/2023 14:25   DG Abd 1 View  Result Date: 06/16/2023 CLINICAL DATA:  Abdominal swelling, left greater than right. Constipation. EXAM: ABDOMEN - 1 VIEW COMPARISON:  CT angio 05/11/2023 FINDINGS: A paucity of bowel gas is present in the left lower quadrant. Mildly dilated loops of small bowel are present proximal to this area. No definite free air is present. Aortobifemoral graft is noted. IMPRESSION: Paucity of bowel gas in the left lower quadrant with mildly dilated loops of small bowel proximal to this area. Findings are concerning for bowel obstruction. Electronically Signed   By: Marin Roberts M.D.   On: 06/16/2023 12:41    ASSESSMENT & PLAN [name] is a 89 y.o. male who presents to the hematology clinic for evaluation of leukocytosis, anemia and thrombocytosis. We reviewed possible etiologies including inflammatory process that is causing bone marrow suppression versus primary bone marrow disorder. With patient's history of polymyalgia rheumatica and on chronic steroid therapy, we suspect inflammatory process as underlying etiology. Patient will proceed with laboratory evaluation today to assess other causes. If there is concern for underlying bone marrow disorder, we will determine if bone marrow biopsy is needed.   #Normocytic anemia: --History of iron deficiency anemia, currently on PO iron replacement --Last recent received 2 units of PRBC on 06/27/2023.  --Will check iron panel today. If there is persistent iron deficiency, we will arrange for IV iron  --Will rule out other causes including nutritional deficiencies, hemolysis, paraproteinemia.  --Need to determine if bone marrow biopsy is needed based on above workup  #Leukocytosis-neutrophil predominant: --Likely inflammatory process and medication induced due to history of polymyalgia rheumatica while on prednisone therapy --Don't  suspect infectious process as patient is not exhibiting symptoms including fevers or chills --Will evaluate for driver mutations with MPN panel and BCR/ABL FISH  #Thrombocytosis: --Suspect inflammatory process versus iron deficiency anemia. --No evidence of splenectomy   Orders Placed This Encounter  Procedures   CBC with Differential (Cancer Center Only)    Standing Status:   Future    Number of Occurrences:   1    Standing Expiration Date:   07/04/2024   CMP (Cancer Center only)    Standing Status:   Future    Number of Occurrences:   1    Standing Expiration Date:   07/04/2024   Iron and Iron Binding Capacity ([facility] only)    Standing Status:   Future    Number of Occurrences:   1    Standing Expiration Date:   07/04/2024   Ferritin  Standing Status:   Future    Number of Occurrences:   1    Standing Expiration Date:   07/04/2024   Vitamin B12    Standing Status:   Future    Number of Occurrences:   1    Standing Expiration Date:   07/04/2024   Haptoglobin    Standing Status:   Future    Number of Occurrences:   1    Standing Expiration Date:   07/04/2024   Lactate dehydrogenase (LDH)    Standing Status:   Future    Number of Occurrences:   1    Standing Expiration Date:   07/04/2024   Methylmalonic acid, serum    Standing Status:   Future    Number of Occurrences:   1    Standing Expiration Date:   07/04/2024   Retic Panel    Standing Status:   Future    Number of Occurrences:   1    Standing Expiration Date:   07/04/2024   Multiple Myeloma Panel (SPEP&IFE w/QIG)    Standing Status:   Future    Number of Occurrences:   1    Standing Expiration Date:   07/04/2024   Kappa/lambda light chains    Standing Status:   Future    Number of Occurrences:   1    Standing Expiration Date:   07/04/2024   Copper, serum    Standing Status:   Future    Number of Occurrences:   1    Standing Expiration Date:   07/04/2024   BCR ABL1 FISH ([facility])    Standing Status:   Future    Number of  Occurrences:   1    Standing Expiration Date:   07/04/2024   JAK2 (INCLUDING V617F AND EXON 12), MPL,& CALR W/RFL MPN PANEL ([facility]GS)    Standing Status:   Future    Number of Occurrences:   1    Standing Expiration Date:   07/04/2024   Save Smear for Provider Slide Review    Standing Status:   Future    Number of Occurrences:   1    Standing Expiration Date:   07/04/2024   Sample to Blood Bank    Standing Status:   Future    Number of Occurrences:   1    Standing Expiration Date:   07/04/2024    All questions were answered. The patient knows to call the clinic with any problems, questions or concerns.  I have spent a total of 60 minutes minutes of face-to-face and non-face-to-face time, preparing to see the patient, obtaining and/or reviewing separately obtained history, performing a medically appropriate examination, counseling and educating the patient, ordering tests/procedures, referring and communicating with other health care professionals, documenting clinical information in the electronic health record, independently interpreting results and communicating results to the patient, and care coordination.   Georga Kaufmann, PA-C Department of Hematology/Oncology [facility] at [facility] Phone: [phone]  Patient was seen with Dr. Leonides Schanz  I have read the above note and personally examined the patient. I agree with the assessment and plan as noted above.  Briefly [name] is a 89 year old male who presents for evaluation of abnormal blood counts.  He has a leukocytosis, thrombocytosis, and normocytic anemia.  Was recently on 06/14/2023 he had a white blood cell count 14.7, hemoglobin 8.1, and platelets of 584.  His leukocytosis is of neutrophilic predominance.  At this time there are numerous possible  etiologies for his findings.  Inflammation could easily produce these 3 results.  Additionally there could be underlying bone marrow disorder or myeloproliferative  neoplasm.  We will order MPN panel with reflex as well as BCR/ABL FISH.  Additionally we will check for iron studies which could potentially explain the low hemoglobin and platelet count.  The patient voiced understanding of our findings and the plan moving forward.   Ulysees Barns, MD Department of Hematology/Oncology [facility] at [facility] Phone: [phone] Pager: [phone] Email: [email or web address]

## 2023-07-04 NOTE — Telephone Encounter (Signed)
Tay  Pls order echo for histoyr of PE    SIGNATURE    Dr. Kalman Shan, M.D., F.C.C.P,  Pulmonary and Critical Care Medicine Staff Physician, [facility] Director - Interstitial Lung Disease  Program  Pulmonary Fibrosis [facility] at [facility] [place], [place], [address]   Pager: [phone], If no answer  -> Check AMION or Try [phone] Telephone (clinical office): [phone] Telephone (research): [phone]  6:45 PM 07/04/2023

## 2023-07-05 ENCOUNTER — Inpatient Hospital Stay: Payer: Medicare Other | Attending: Physician Assistant | Admitting: Physician Assistant

## 2023-07-05 ENCOUNTER — Other Ambulatory Visit: Payer: Self-pay

## 2023-07-05 ENCOUNTER — Ambulatory Visit (HOSPITAL_COMMUNITY)
Admission: RE | Admit: 2023-07-05 | Discharge: 2023-07-05 | Disposition: A | Discharge status: Home / Self Care | Payer: Medicare Other | Source: Ambulatory Visit | Attending: Primary Care | Admitting: Primary Care

## 2023-07-05 ENCOUNTER — Other Ambulatory Visit: Payer: Self-pay | Admitting: *Deleted

## 2023-07-05 ENCOUNTER — Inpatient Hospital Stay (HOSPITAL_BASED_OUTPATIENT_CLINIC_OR_DEPARTMENT_OTHER): Payer: Medicare Other | Admitting: Physician Assistant

## 2023-07-05 ENCOUNTER — Inpatient Hospital Stay: Payer: Medicare Other

## 2023-07-05 VITALS — BP 99/62 | HR 98 | Temp 97.5°F | Resp 17 | Wt 147.0 lb

## 2023-07-05 DIAGNOSIS — D72828 Other elevated white blood cell count: Secondary | ICD-10-CM | POA: Diagnosis not present

## 2023-07-05 DIAGNOSIS — D649 Anemia, unspecified: Secondary | ICD-10-CM | POA: Insufficient documentation

## 2023-07-05 DIAGNOSIS — Z806 Family history of leukemia: Secondary | ICD-10-CM | POA: Insufficient documentation

## 2023-07-05 DIAGNOSIS — D72825 Bandemia: Secondary | ICD-10-CM

## 2023-07-05 DIAGNOSIS — D75839 Thrombocytosis, unspecified: Secondary | ICD-10-CM | POA: Insufficient documentation

## 2023-07-05 DIAGNOSIS — R918 Other nonspecific abnormal finding of lung field: Secondary | ICD-10-CM | POA: Diagnosis not present

## 2023-07-05 DIAGNOSIS — J849 Interstitial pulmonary disease, unspecified: Secondary | ICD-10-CM | POA: Insufficient documentation

## 2023-07-05 DIAGNOSIS — Z7952 Long term (current) use of systemic steroids: Secondary | ICD-10-CM | POA: Diagnosis not present

## 2023-07-05 DIAGNOSIS — L89159 Pressure ulcer of sacral region, unspecified stage: Secondary | ICD-10-CM

## 2023-07-05 DIAGNOSIS — Z9981 Dependence on supplemental oxygen: Secondary | ICD-10-CM | POA: Insufficient documentation

## 2023-07-05 DIAGNOSIS — I7121 Aneurysm of the ascending aorta, without rupture: Secondary | ICD-10-CM | POA: Diagnosis not present

## 2023-07-05 LAB — CBC WITH DIFFERENTIAL (CANCER CENTER ONLY)
Abs Immature Granulocytes: 0.14 10*3/uL — ABNORMAL HIGH (ref 0.00–0.07)
Basophils Absolute: 0.1 10*3/uL (ref 0.0–0.1)
Basophils Relative: 0 %
Eosinophils Absolute: 0.2 10*3/uL (ref 0.0–0.5)
Eosinophils Relative: 1 %
HCT: 29.8 % — ABNORMAL LOW (ref 39.0–52.0)
Hemoglobin: 9.4 g/dL — ABNORMAL LOW (ref 13.0–17.0)
Immature Granulocytes: 1 %
Lymphocytes Relative: 8 %
Lymphs Abs: 1.4 10*3/uL (ref 0.7–4.0)
MCH: 26 pg (ref 26.0–34.0)
MCHC: 31.5 g/dL (ref 30.0–36.0)
MCV: 82.3 fL (ref 80.0–100.0)
Monocytes Absolute: 1.1 10*3/uL — ABNORMAL HIGH (ref 0.1–1.0)
Monocytes Relative: 6 %
Neutro Abs: 14.5 10*3/uL — ABNORMAL HIGH (ref 1.7–7.7)
Neutrophils Relative %: 84 %
Platelet Count: 479 10*3/uL — ABNORMAL HIGH (ref 150–400)
RBC: 3.62 MIL/uL — ABNORMAL LOW (ref 4.22–5.81)
RDW: 18.2 % — ABNORMAL HIGH (ref 11.5–15.5)
WBC Count: 17.4 10*3/uL — ABNORMAL HIGH (ref 4.0–10.5)
nRBC: 0 % (ref 0.0–0.2)

## 2023-07-05 LAB — RETIC PANEL
Immature Retic Fract: 23.1 % — ABNORMAL HIGH (ref 2.3–15.9)
RBC.: 3.49 MIL/uL — ABNORMAL LOW (ref 4.22–5.81)
Retic Count, Absolute: 34.6 10*3/uL (ref 19.0–186.0)
Retic Ct Pct: 1 % (ref 0.4–3.1)
Reticulocyte Hemoglobin: 27.8 pg — ABNORMAL LOW (ref >27.9)

## 2023-07-05 LAB — SAVE SMEAR(SSMR), FOR PROVIDER SLIDE REVIEW

## 2023-07-05 LAB — CMP (CANCER CENTER ONLY)
ALT: 25 U/L (ref 0–44)
AST: 22 U/L (ref 15–41)
Albumin: 3.3 g/dL — ABNORMAL LOW (ref 3.5–5.0)
Alkaline Phosphatase: 80 U/L (ref 38–126)
Anion gap: 7 (ref 5–15)
BUN: 23 mg/dL (ref 8–23)
CO2: 30 mmol/L (ref 22–32)
Calcium: 9.2 mg/dL (ref 8.9–10.3)
Chloride: 101 mmol/L (ref 98–111)
Creatinine: 0.94 mg/dL (ref 0.61–1.24)
GFR, Estimated: >60 mL/min (ref >60)
Glucose, Bld: 119 mg/dL — ABNORMAL HIGH (ref 70–99)
Potassium: 4.2 mmol/L (ref 3.5–5.1)
Sodium: 138 mmol/L (ref 135–145)
Total Bilirubin: 0.3 mg/dL (ref 0.3–1.2)
Total Protein: 6.8 g/dL (ref 6.5–8.1)

## 2023-07-05 LAB — SAMPLE TO BLOOD BANK

## 2023-07-05 LAB — VITAMIN B12: Vitamin B-12: 3440 pg/mL — ABNORMAL HIGH (ref 180–914)

## 2023-07-05 LAB — FERRITIN: Ferritin: 47 ng/mL (ref 24–336)

## 2023-07-05 LAB — LACTATE DEHYDROGENASE: LDH: 154 U/L (ref 98–192)

## 2023-07-05 LAB — IRON AND IRON BINDING CAPACITY (CC-WL,HP ONLY)
Iron: 26 ug/dL — ABNORMAL LOW (ref 45–182)
Saturation Ratios: 10 % — ABNORMAL LOW (ref 17.9–39.5)
TIBC: 253 ug/dL (ref 250–450)
UIBC: 227 ug/dL (ref 117–376)

## 2023-07-05 MED ORDER — ACETAMINOPHEN 325 MG PO TABS: 650.0000 mg | ORAL_TABLET | Freq: Once | ORAL | Status: DC

## 2023-07-05 MED ORDER — ACETAMINOPHEN 325 MG PO TABS
650.0000 mg | ORAL_TABLET | Freq: Once | ORAL | Status: AC
  Administered 2023-07-05: 650 mg via ORAL
  Filled 2023-07-05: qty 2

## 2023-07-06 ENCOUNTER — Other Ambulatory Visit: Payer: Self-pay

## 2023-07-06 ENCOUNTER — Ambulatory Visit (INDEPENDENT_AMBULATORY_CARE_PROVIDER_SITE_OTHER): Payer: Medicare Other | Admitting: Infectious Disease

## 2023-07-06 ENCOUNTER — Telehealth: Payer: Self-pay

## 2023-07-06 ENCOUNTER — Encounter: Payer: Self-pay | Admitting: Infectious Disease

## 2023-07-06 VITALS — BP 95/63 | HR 105 | Temp 97.4°F

## 2023-07-06 DIAGNOSIS — L89152 Pressure ulcer of sacral region, stage 2: Secondary | ICD-10-CM

## 2023-07-06 DIAGNOSIS — I4892 Unspecified atrial flutter: Secondary | ICD-10-CM | POA: Diagnosis not present

## 2023-07-06 DIAGNOSIS — M353 Polymyalgia rheumatica: Secondary | ICD-10-CM

## 2023-07-06 DIAGNOSIS — R700 Elevated erythrocyte sedimentation rate: Secondary | ICD-10-CM | POA: Diagnosis not present

## 2023-07-06 DIAGNOSIS — J849 Interstitial pulmonary disease, unspecified: Secondary | ICD-10-CM | POA: Insufficient documentation

## 2023-07-06 DIAGNOSIS — I2699 Other pulmonary embolism without acute cor pulmonale: Secondary | ICD-10-CM | POA: Diagnosis not present

## 2023-07-06 DIAGNOSIS — R509 Fever, unspecified: Secondary | ICD-10-CM

## 2023-07-06 DIAGNOSIS — I480 Paroxysmal atrial fibrillation: Secondary | ICD-10-CM | POA: Diagnosis not present

## 2023-07-06 DIAGNOSIS — R972 Elevated prostate specific antigen [PSA]: Secondary | ICD-10-CM

## 2023-07-06 DIAGNOSIS — Z86711 Personal history of pulmonary embolism: Secondary | ICD-10-CM

## 2023-07-06 DIAGNOSIS — D472 Monoclonal gammopathy: Secondary | ICD-10-CM

## 2023-07-06 DIAGNOSIS — J8282 Acute eosinophilic pneumonia: Secondary | ICD-10-CM | POA: Diagnosis not present

## 2023-07-06 DIAGNOSIS — I776 Arteritis, unspecified: Secondary | ICD-10-CM

## 2023-07-06 DIAGNOSIS — D72829 Elevated white blood cell count, unspecified: Secondary | ICD-10-CM

## 2023-07-06 DIAGNOSIS — I82401 Acute embolism and thrombosis of unspecified deep veins of right lower extremity: Secondary | ICD-10-CM | POA: Diagnosis not present

## 2023-07-06 DIAGNOSIS — D75839 Thrombocytosis, unspecified: Secondary | ICD-10-CM

## 2023-07-06 DIAGNOSIS — Z7901 Long term (current) use of anticoagulants: Secondary | ICD-10-CM | POA: Diagnosis not present

## 2023-07-06 DIAGNOSIS — I5032 Chronic diastolic (congestive) heart failure: Secondary | ICD-10-CM | POA: Diagnosis not present

## 2023-07-06 HISTORY — DX: Pressure ulcer of sacral region, stage 2: L89.152

## 2023-07-06 HISTORY — DX: Elevated prostate specific antigen (PSA): R97.20

## 2023-07-06 HISTORY — DX: Polymyalgia rheumatica: M35.3

## 2023-07-06 HISTORY — DX: Interstitial pulmonary disease, unspecified: J84.9

## 2023-07-06 HISTORY — DX: Elevated white blood cell count, unspecified: D72.829

## 2023-07-06 NOTE — Telephone Encounter (Signed)
Per Dr. Daiva Eves called wound care to see if they could see pt before 8/22 to evaluate wound. Left voicemail with wound care requesting they call patient wife Darl Pikes with appointment information.  Juanita Laster, RMA

## 2023-07-06 NOTE — Progress Notes (Signed)
Subjective:  Chief complaint: Follow-up for what was potential eosinophilic pneumonia, continued profound fatigue   Patient ID: [name], male    DOB: 21-May-1930, 89 y.o.   MRN: [id number]  HPI  89 y.o. male with complicated medical history including aortic aneurysm status post placement of stent graft, polymyalgia rheumatica that has been well-controlled on prednisone who has been experiencing malaise for nearly 8 months with intermittent fevers and elevated inflammatory markers, who was worked up for this condition by infectious disease at [facility] including PET scan that showed increased uptake in the aorta near stent graft with question of aortitis.   The tagged white cell scan subsequently did not show uptake in this area or anywhere else in the body.   I reviewed the case with Dr.Julia Dolores Frame over the phone and she stated that they had reviewed the PET scan again with radiology and that radiology is not entirely convinced of the uptake being particularly dramatic.   Regardless the patient was the begun on empiric antibiotic regimen for possible bacterial infectious aortitis with daptomycin and ceftriaxone.  The plan was to see how the patient did on this including monitoring his inflammatory markers.  All along however there has been suspicion that there might be an autoimmune process involved and the patient is followed closely by Dr. Alberteen Spindle with nephrology at [facility].   He was then been admitted to [facility] with an acute pulmonary embolism and a CT angiogram that also shows multifocal infiltrates.  He  experienced progressive hypoxemic respiratory failure   Given his history of being on daptomycin I was concerned about eosinophilic pneumonia and stopped the daptomycin and exchanged for vancomycin and has asked pulmonary critical care to see him.  He had a deterioration from when he was on the floor and needed to be transferred to the intensive care unit.  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  His hospital course was complicated by his high oxygen requirements and some delirium that was multifactorial, complicated by some depressive symptoms and anxiety.  Family however he improved sufficiently to be discharged to [facility] nursing facility and now he is back home.  His oxygen requirements now are varying between not needing oxygen to needing up to 2 L via nasal cannula.  He has developed a stage II ulcer on his buttock where he has been sitting for protracted period of time.  He had lost quite a bit of weight but is now putting on weight again.  He has not been able to get over to [facility] to be seen by [facility] rheumatology which was a tentative plan when I last saw him.  He has been seen by Dr. Francine Graven and now Dr. Marchelle Gearing with critical care pulmonary medicine.  He also was seen yesterday by hematology oncology with Sharon Mt, PA.  He had a bout of obstructive symptomatology due to enlarged prostate.  CT scan of the abdomen pelvis had indeed shown marked distention of the urinary bladder level of the umbilicus with some bulk bladder outlet obstruction now irregularly enlarged prostate gland.  There is a stable size aortic aneurysm observe.  There is small pleural effusion seen  Patient has not been seen by local rheumatology yet either.  He is not not on that high dose of prednisone at this point in time.  His inflammatory markers do remain elevated with a sed rate well over 100 and a  white blood cell count that is range in the 17-20,000 along with elevated in the 4-500,000 range.  In terms of his symptomatology he is dramatically improved to my exam when I last saw him in the hospital as an inpatient.  He has not needed nearly as much oxygen does not seem to have his much pulmonary complaints and appears more comfortable though he was not terribly happy  about the different diagnoses that were being thrown around that could cause his inflammatory markers to be elevated including entities such as prostate cancer or bone marrow disorders.  Currently his main symptom that is impeding his quality of life is printout found fatigue which was apparently better when he was on high-dose corticosteroids.  Scheduled to see Dr. Waynard Edwards later today.  He also had a CT scan of the chest performed yesterday which was not read when I had examined the patient and still is pending.  His appointment with wound care though it is the end of the month and we will try to get that moved up for him.  He is scheduled to see urology as well in the upcoming days.  Think going to [facility] or [facility] for the opinion of a tertiary care rheumatologist would be helpful.    Past Medical History:  Diagnosis Date   AAA (abdominal aortic aneurysm) (HCC)    Abnormal EKG    Left atrial abnormality   Allergic rhinitis    Allergy    Benign neoplasm of colon 04/14/10   3 small polyps on colonoscopy by Dr. Marina Goodell   Carpal tunnel syndrome    Cataract    Degenerative disc disease    Disturbances metabolism of methionine, homocystine, and cystathionine (HCC)    Elevated homocysteine   Elevated prostate specific antigen (PSA)    Hearing loss    Hyperlipidemia    Impotence of organic origin    Penile implant   Internal hemorrhoids    Neck pain    Otosclerosis of both ears    Peripheral vascular disease (HCC)    Bilateral femoral bruit   Plantar fasciitis    Polymyalgia rheumatica (HCC)    Rotator cuff syndrome of left shoulder    Scoliosis    Shoulder pain     Past Surgical History:  Procedure Laterality Date   ABDOMINAL AORTIC ENDOVASCULAR STENT GRAFT N/A 08/16/2017   Procedure: ABDOMINAL AORTIC ENDOVASCULAR STENT GRAFT insertion;  Surgeon: Nada Libman, MD;  Location: [facility] OR;  Service: Vascular;  Laterality: N/A;   Actinic keratosis removal  06/21/2010   Left  shoulder; Dr. 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   Procedure: DUPUYTREN CONTRACTURE RELEASE RIGHT LONG, RING AND SMALL FINGERS;  Surgeon: Wyn Forster., MD;  Location: [facility];  Service: Orthopedics;  Laterality: Right;   Implant penile pump  2001   IR ANGIOGRAM PELVIS SELECTIVE OR SUPRASELECTIVE  12/11/2020   IR ANGIOGRAM SELECTIVE EACH ADDITIONAL VESSEL  12/11/2020   IR EMBO ARTERIAL NOT HEMORR HEMANG INC GUIDE ROADMAPPING  12/11/2020   IR RADIOLOGIST EVAL & MGMT  07/30/2020   IR US GUIDE VASC ACCESS RIGHT  12/11/2020   Resection of appendix and tip of rectum  February 2005   STAPEDECTOMY Bilateral 1985, 1988   [facility]    Family History  Problem Relation Age of Onset   Heart disease Mother    Emphysema Mother    Leukemia  Brother        Chronic lymphocytic leukemia   Colon cancer Neg Hx    Esophageal cancer Neg Hx    Rectal cancer Neg Hx    Stomach cancer Neg Hx       Social History   Socioeconomic History   Marital status: Married    Spouse name: Lizzie Dagan   Number of children: 2   Years of education: Not on file   Highest education level: Not on file  Occupational History   Occupation: Dealer: [employer]  Tobacco Use   Smoking status: Never   Smokeless tobacco: Never  Vaping Use   Vaping status: Never Used  Substance and Sexual Activity   Alcohol use: No    Alcohol/week: 0.0 standard drinks of alcohol   Drug use: No   Sexual activity: Not Currently  Other Topics Concern   Not on file  Social History Narrative   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   Social Determinants of Health   Financial Resource Strain: Low Risk  (03/07/2023)   Overall Financial Resource Strain (CARDIA)    Difficulty of Paying Living Expenses: Not hard at all  Food Insecurity: No Food Insecurity  (03/15/2023)   Hunger Vital Sign    Worried About Running Out of Food in the Last Year: Never true    Ran Out of Food in the Last Year: Never true  Transportation Needs: No Transportation Needs (03/15/2023)   PRAPARE - Administrator, Civil Service (Medical): No    Lack of Transportation (Non-Medical): No  Physical Activity: Not on file  Stress: Not on file  Social Connections: Not on file    Allergies  Allergen Reactions   Rosuvastatin Other (See Comments)    Stopped taking due to feeling achy     Codeine Nausea And Vomiting   Morphine And Codeine Nausea And Vomiting   Omnicef [Cefdinir] Other (See Comments)    Abdominal pain     Current Outpatient Medications:    acetaminophen (TYLENOL) 325 MG tablet, Take 2 tablets (650 mg total) by mouth every 6 (six) hours as needed for mild pain (or Fever >/= 101)., Disp: , Rfl:    amoxicillin-clavulanate (AUGMENTIN) 875-125 MG tablet, SMARTSIG:1 Tablet(s) By Mouth Every 12 Hours (Patient not taking: Reported on 07/03/2023), Disp: , Rfl:    apixaban (ELIQUIS) 2.5 MG TABS tablet, Take 2.5 mg by mouth 2 (two) times daily., Disp: , Rfl:    aspirin 81 MG chewable tablet, Chew 1 tablet (81 mg total) by mouth daily. (Patient not taking: Reported on 07/05/2023), Disp: , Rfl:    bisacodyl 5 MG EC tablet, Take 10 mg by mouth daily as needed for moderate constipation., Disp: , Rfl:    butalbital-acetaminophen-caffeine (FIORICET) 50-325-40 MG tablet, Take 1 tablet by mouth daily as needed for headache., Disp: 14 tablet, Rfl: 0   cefdinir (OMNICEF) 300 MG capsule, Take 300 mg by mouth 2 (two) times daily. (Patient not taking: Reported on 07/05/2023), Disp: , Rfl:    cholecalciferol (VITAMIN D3) 25 MCG (1000 UNIT) tablet, Take 1,000 Units by mouth daily., Disp: , Rfl:    diclofenac Sodium (VOLTAREN) 1 % GEL, Apply 2 g topically 4 (four) times daily as needed (shoulder pain)., Disp: 2 g, Rfl: 0   docusate sodium (COLACE) 100 MG capsule, Take 100 mg by  mouth 2 (two) times daily., Disp: , Rfl:  doxycycline (VIBRAMYCIN) 100 MG capsule, Take 100 mg by mouth 2 (two) times daily., Disp: , Rfl:    famotidine (PEPCID) 20 MG tablet, Take 1 tablet (20 mg total) by mouth daily. (Patient not taking: Reported on 06/14/2023), Disp: , Rfl:    feeding supplement (ENSURE ENLIVE / ENSURE PLUS) LIQD, Take 237 mLs by mouth 3 (three) times daily between meals., Disp: 237 mL, Rfl: 12   ferrous sulfate 324 MG TBEC, Take 324 mg by mouth daily. As needed, Disp: , Rfl:    finasteride (PROSCAR) 5 MG tablet, Take 5 mg by mouth daily., Disp: , Rfl:    fish oil-omega-3 fatty acids 1000 MG capsule, Take 1,000 mg by mouth daily. , Disp: , Rfl:    fluorometholone (FML) 0.1 % ophthalmic suspension, Place 1 drop into the right eye at bedtime. , Disp: , Rfl:    folic acid (FOLVITE) 1 MG tablet, Take 1 mg by mouth daily. (Patient not taking: Reported on 07/05/2023), Disp: , Rfl:    L-Methylfolate-B12-B6-B2 (METAFOLBIC) 04-28-49-5 MG TABS, TAKE ONE TABLET TWICE DAILY (Patient taking differently: Take 1 tablet by mouth 2 (two) times daily.), Disp: 60 tablet, Rfl: 2   linaclotide (LINZESS) 72 MCG capsule, Take 72 mcg by mouth daily before breakfast. (Patient not taking: Reported on 07/03/2023), Disp: , Rfl:    LORazepam (ATIVAN) 0.5 MG tablet, Take 0.5 mg by mouth daily. 0.25 am, 0.25 pm,  0.5 at night, Disp: , Rfl:    magnesium hydroxide (MILK OF MAGNESIA) 400 MG/5ML suspension, Take by mouth daily as needed for mild constipation., Disp: , Rfl:    melatonin 3 MG TABS tablet, Take 1 tablet (3 mg total) by mouth at bedtime. (Patient taking differently: Take 5 mg by mouth as needed.), Disp: , Rfl: 0   metoprolol succinate (TOPROL XL) 25 MG 24 hr tablet, Take 1 tablet (25 mg total) by mouth daily., Disp: 90 tablet, Rfl: 3   mirtazapine (REMERON) 15 MG tablet, Take 15 mg by mouth at bedtime., Disp: , Rfl:    Multiple Vitamins-Minerals (MULTIVITAMIN PO), Take 1 tablet by mouth daily. , Disp: ,  Rfl:    pantoprazole (PROTONIX) 40 MG tablet, Take 1 tablet (40 mg total) by mouth 2 (two) times daily for 30 days, THEN 1 tablet (40 mg total) daily., Disp: 420 tablet, Rfl: 0   polyethylene glycol (MIRALAX / GLYCOLAX) 17 g packet, Take 17 g by mouth 2 (two) times daily., Disp: , Rfl:    predniSONE (DELTASONE) 20 MG tablet, Take 2 tablets (40 mg total) by mouth daily with breakfast. (Patient taking differently: Take 15 mg by mouth daily with breakfast. 15mg  daily), Disp: , Rfl:    senna-docusate (SENOKOT-S) 8.6-50 MG tablet, Take 1 tablet by mouth 2 (two) times daily., Disp: , Rfl:    sulfamethoxazole-trimethoprim (BACTRIM DS) 800-160 MG tablet, Take 1 tablet by mouth 3 (three) times a week. Monday, Wednesday, Friday (Patient not taking: Reported on 07/03/2023), Disp: , Rfl:    tamsulosin (FLOMAX) 0.4 MG CAPS capsule, Take 0.4 mg by mouth at bedtime., Disp: , Rfl:    [place] 1200 MG CAPS, Take 1 tablet by mouth daily., Disp: , Rfl:    traMADol (ULTRAM) 50 MG tablet, Take 50 mg by mouth every 4 (four) hours as needed for moderate pain or severe pain., Disp: , Rfl:    zolpidem (AMBIEN) 10 MG tablet, Take 5 mg by mouth at bedtime as needed for sleep., Disp: , Rfl:    Review of Systems  Constitutional:  Positive for fatigue. Negative for activity change, appetite change, chills, diaphoresis, fever and unexpected weight change.  HENT:  Negative for congestion, rhinorrhea, sinus pressure, sneezing, sore throat and trouble swallowing.   Eyes:  Negative for photophobia and visual disturbance.  Respiratory:  Positive for shortness of breath. Negative for cough, chest tightness, wheezing and stridor.   Cardiovascular:  Negative for chest pain, palpitations and leg swelling.  Gastrointestinal:  Negative for abdominal distention, abdominal pain, anal bleeding, blood in stool, constipation, diarrhea, nausea and vomiting.  Genitourinary:  Negative for difficulty urinating, dysuria, flank pain and hematuria.   Musculoskeletal:  Negative for arthralgias, back pain, gait problem, joint swelling and myalgias.  Skin:  Positive for wound. Negative for color change, pallor and rash.  Neurological:  Negative for dizziness, tremors, weakness and light-headedness.  Hematological:  Negative for adenopathy. Does not bruise/bleed easily.  Psychiatric/Behavioral:  Negative for agitation, behavioral problems, confusion, decreased concentration, dysphoric mood and sleep disturbance.        Objective:   Physical Exam Nursing note reviewed. Exam conducted with a chaperone present.  Constitutional:      Appearance: He is well-developed.  HENT:     Head: Normocephalic and atraumatic.  Eyes:     Conjunctiva/sclera: Conjunctivae normal.  Cardiovascular:     Rate and Rhythm: Normal rate and regular rhythm.     Heart sounds: No murmur heard.    No friction rub. No gallop.  Pulmonary:     Effort: Pulmonary effort is normal. No respiratory distress.     Breath sounds: No stridor. No wheezing or rhonchi.  Abdominal:     General: There is no distension.     Palpations: Abdomen is soft.  Musculoskeletal:        General: No tenderness. Normal range of motion.     Cervical back: Normal range of motion and neck supple.  Skin:    General: Skin is warm and dry.     Coloration: Skin is not pale.     Findings: No erythema or rash.  Neurological:     General: No focal deficit present.     Mental Status: He is alert and oriented to person, place, and time.  Psychiatric:        Mood and Affect: Mood normal.        Behavior: Behavior normal.        Thought Content: Thought content normal.        Judgment: Judgment normal.    Decubitus ulcer 07/06/2023:         Assessment & Plan:   Elevated ESR, WBC, Platelets in context of profound fatigue in patient with prior diagnosis of PMR but then concern for some other pathology that was driving his markedly elevated inflammatory markers.  Infectious causes have  been exhaustively worked up at [facility].  As mentioned the aortitis was a very "soft call and he completed antibiotics for that.  I support going down the road of looking for other causes of inflammation including potentially a cancer or the hematological problem.  I think seeing urology and seeing what tests or labs are procedures they might want to proceed with would be helpful.  Following up with oncology could also be helpful.  The bone marrow biopsy might be revealing but would not be without any pain and certainly would have some blood bleeding risk.  I really do not think the patient has any evidence of a disseminated infection but if 1 did do any  a bone marrow biopsy is pursued it is always useful to send material for AFB and fungal culture if the bone marrow is sampled.  Think seeing a rheumatologist probably at [facility] would be the simplest way to proceed with him being seen by tertiary care expert. I would suggest they reach out to his MD and great friend Steve Rattler, MD at [facility] and that he could facilitate a timely workup.  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 Monica Becton  PE: he is on anticoagulation  Decubitus ulcer: will endeavor to get him into wound care sooner  I have personally spent 51 minutes involved in face-to-face and non-face-to-face activities for this patient on the day of the visit. Professional time spent includes the following activities: Preparing to see the patient (review of tests), Obtaining and/or reviewing separately obtained history (admission/discharge record), Performing a medically appropriate examination and/or evaluation , Ordering medications/tests/procedures, referring and communicating with other health care professionals, Documenting clinical information in the EMR, Independently interpreting results (not separately reported),  Communicating results to the patient/family/caregiver, Counseling and educating the patient/family/caregiver and Care coordination (not separately reported).

## 2023-07-07 DIAGNOSIS — J8410 Pulmonary fibrosis, unspecified: Secondary | ICD-10-CM | POA: Diagnosis not present

## 2023-07-07 DIAGNOSIS — I82401 Acute embolism and thrombosis of unspecified deep veins of right lower extremity: Secondary | ICD-10-CM | POA: Diagnosis not present

## 2023-07-07 DIAGNOSIS — J9601 Acute respiratory failure with hypoxia: Secondary | ICD-10-CM | POA: Diagnosis not present

## 2023-07-07 DIAGNOSIS — F0390 Unspecified dementia without behavioral disturbance: Secondary | ICD-10-CM | POA: Diagnosis not present

## 2023-07-07 DIAGNOSIS — I480 Paroxysmal atrial fibrillation: Secondary | ICD-10-CM | POA: Diagnosis not present

## 2023-07-07 DIAGNOSIS — D72829 Elevated white blood cell count, unspecified: Secondary | ICD-10-CM | POA: Diagnosis not present

## 2023-07-07 DIAGNOSIS — R1904 Left lower quadrant abdominal swelling, mass and lump: Secondary | ICD-10-CM | POA: Diagnosis not present

## 2023-07-07 DIAGNOSIS — J8282 Acute eosinophilic pneumonia: Secondary | ICD-10-CM | POA: Diagnosis not present

## 2023-07-07 DIAGNOSIS — I5032 Chronic diastolic (congestive) heart failure: Secondary | ICD-10-CM | POA: Diagnosis not present

## 2023-07-07 DIAGNOSIS — R5383 Other fatigue: Secondary | ICD-10-CM | POA: Diagnosis not present

## 2023-07-07 DIAGNOSIS — I2699 Other pulmonary embolism without acute cor pulmonale: Secondary | ICD-10-CM | POA: Diagnosis not present

## 2023-07-07 DIAGNOSIS — M353 Polymyalgia rheumatica: Secondary | ICD-10-CM | POA: Diagnosis not present

## 2023-07-07 DIAGNOSIS — E2740 Unspecified adrenocortical insufficiency: Secondary | ICD-10-CM | POA: Diagnosis not present

## 2023-07-07 DIAGNOSIS — I4892 Unspecified atrial flutter: Secondary | ICD-10-CM | POA: Diagnosis not present

## 2023-07-07 DIAGNOSIS — R5382 Chronic fatigue, unspecified: Secondary | ICD-10-CM | POA: Diagnosis not present

## 2023-07-07 DIAGNOSIS — D638 Anemia in other chronic diseases classified elsewhere: Secondary | ICD-10-CM | POA: Diagnosis not present

## 2023-07-10 ENCOUNTER — Encounter: Payer: Self-pay | Admitting: Internal Medicine

## 2023-07-10 ENCOUNTER — Telehealth (INDEPENDENT_AMBULATORY_CARE_PROVIDER_SITE_OTHER): Payer: Medicare Other | Admitting: Internal Medicine

## 2023-07-10 DIAGNOSIS — I480 Paroxysmal atrial fibrillation: Secondary | ICD-10-CM | POA: Diagnosis not present

## 2023-07-10 DIAGNOSIS — R972 Elevated prostate specific antigen [PSA]: Secondary | ICD-10-CM | POA: Diagnosis not present

## 2023-07-10 DIAGNOSIS — R5381 Other malaise: Secondary | ICD-10-CM

## 2023-07-10 DIAGNOSIS — R5383 Other fatigue: Secondary | ICD-10-CM

## 2023-07-10 DIAGNOSIS — J8282 Acute eosinophilic pneumonia: Secondary | ICD-10-CM | POA: Diagnosis not present

## 2023-07-10 DIAGNOSIS — I5032 Chronic diastolic (congestive) heart failure: Secondary | ICD-10-CM | POA: Diagnosis not present

## 2023-07-10 DIAGNOSIS — I2699 Other pulmonary embolism without acute cor pulmonale: Secondary | ICD-10-CM

## 2023-07-10 DIAGNOSIS — R700 Elevated erythrocyte sedimentation rate: Secondary | ICD-10-CM

## 2023-07-10 DIAGNOSIS — I4892 Unspecified atrial flutter: Secondary | ICD-10-CM | POA: Diagnosis not present

## 2023-07-10 DIAGNOSIS — N401 Enlarged prostate with lower urinary tract symptoms: Secondary | ICD-10-CM | POA: Diagnosis not present

## 2023-07-10 DIAGNOSIS — D649 Anemia, unspecified: Secondary | ICD-10-CM | POA: Diagnosis not present

## 2023-07-10 DIAGNOSIS — J849 Interstitial pulmonary disease, unspecified: Secondary | ICD-10-CM

## 2023-07-10 DIAGNOSIS — E559 Vitamin D deficiency, unspecified: Secondary | ICD-10-CM | POA: Diagnosis not present

## 2023-07-10 DIAGNOSIS — D72829 Elevated white blood cell count, unspecified: Secondary | ICD-10-CM | POA: Diagnosis not present

## 2023-07-10 DIAGNOSIS — I82401 Acute embolism and thrombosis of unspecified deep veins of right lower extremity: Secondary | ICD-10-CM | POA: Diagnosis not present

## 2023-07-10 DIAGNOSIS — M19042 Primary osteoarthritis, left hand: Secondary | ICD-10-CM | POA: Diagnosis not present

## 2023-07-10 DIAGNOSIS — L89151 Pressure ulcer of sacral region, stage 1: Secondary | ICD-10-CM | POA: Diagnosis not present

## 2023-07-10 NOTE — Patient Instructions (Addendum)
ICD-10-CM   1. Interstitial lung disease (HCC)  J84.9     2. Physical deconditioning  R53.81      Interstitial lung disease (HCC) DOE  - you have inflammation in lung since 2021/2022: This flared up in April 2024 because of the PE and likely daptomycin. Possible covid last year played a role. Currently improved and needing room air to 1L at rest and night 1L at night. HRCT aug 2024 with improvement from April 2024 but with inreased residual scar compared to baseline  - Likely has IPF that flare up with daptomycin but possible he had BOOP  Plan  - reduce prednisone to 10mg  per day and hold at this dose  - see if lungs flare up with lowerd prednisone - too high risk for lung biopsy but could consider BAL sometime in future - too high risk for anti-fibrotics  - increae o2 to 2L [place] at night and see if this helps fatigue  Pulmonary embolism April 2024; submassive with RV/LV ratio 1.14  -Currently on Eliquis and tolerating well but having ongoing anemia that predated pulmonary embolism - noted per PCP stool occult blood positive August 2024 and on low dose eliquis  Plan -Et an echocardiogram [ordered 07/04/2023] -PCP will address Eliquis on follow-up later in August 2024  -If anemia is a concern can consider stopping [less preferred] versus low-dose [more preferred] -keep appt with DR Bjorn Pippin    Elevated ESR x 2  year per PCP [name], MD reported 07/04/2023  - maybe no longer related to PMR annd/or ILD - maybe another cause   Plan  - monitor with reducing prednisone   Anemia and leukocytosis- new onset February 2024 and ongoing as of August 2024   S/p 2 units packed red blood cells early August 2024  Per PCP [name], MD -stool occult blood positive late July/early August 2024  Plan - [facility] Dr Leonides Schanz  Elevated PSA 07/03/2023  Plan  - Results shared with primary care physician on 07/04/2023 who wlll address  Physical deconditioning Fatigue  This is  multifactorial -and likely due to posthospitalization, steroid related myopathy, medical illnesses [anemia, age, pulmonary issues] and age-related sarcopenia made worse by medical illnesses.. THis is area to focus on most  Plan  - continue PT - optimize anemia - per PCP [name], MD   Followup  - face to face visit in  2weeks ; 30 min

## 2023-07-10 NOTE — Progress Notes (Signed)
Admit date:     01/24/2023  Discharge date: 01/25/2023  Principal Problem:   Fever Patient is a 89 year old male with past medical history of polymyalgia rheumatica on 5 mg of prednisone daily, hyperlipidemia and AAA who while on vacation last June (7 months ago) contracted a viral illness causing some upper respiratory symptoms.  At that time, patient not tested for any viral illnesses including COVID.  Soon after, this viral illness caused his PMR to flareup and patient's prednisone dosage increased from 5 mg to 25 mg (starting August 2023).  Patient's joint pain resolved sometime soon after.  However, since this viral illness, patient has had significant fatigue impacting his life. 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  Patient was seen by his rheumatologist on 2/21 and in attempt to wean down his prednisone, patient started on weekly methotrexate.  (Prednisone not yet weaned until methotrexate felt to be efficacious.)   Patient's wife noted that he has been having fevers that had started Saturday, 2/24.  Patient started first dose of weekly methotrexate Sunday, 2/25.  (Wife and patient are very clear as to when fevers are started and when methotrexate dose was first given, the day after fever started.)  Patient saw his PCP on 2/27 and given new fevers, there were concerns that patient in his steroid-induced immunosuppressed state may have endocarditis and was referred to the emergency room for further evaluation.  In the emergency room, patient noted to have normal lactic acid and a white blood cell count of 14.5 as well as a sed rate of 78.  Brought in for further evaluation.  Fever During patient's entire hospitalization, was not febrile.  Unclear etiology if he has any infection.  Chest x-ray and urinalysis unremarkable.  White blood cell count elevated, but mildly and in the  context of patient being on high-dose of steroids continuously.  Blood cultures drawn and patient was given 1 dose of cefepime and vancomycin.  Patient otherwise asymptomatic (other than complaints of fatigue).  Do not think patient has endocarditis.  He has no clinical signs.  Exam is unremarkable.   Additional possibility could be rheumatologic in nature?  Patient with elevated sed rate despite being on chronic high-dose steroids.  A new autoimmune inflammatory condition may cause a fever, although patient with no other symptoms.   No reason for additional antibiotics.  Will follow blood cultures and if positive will check echocardiogram.  Will plan to discharge.   Addendum: Blood cultures final result negative   Fatigue Had extensive discussion with patient and his wife about his fatigue.  I have a strong suspicion that he may have contracted COVID last summer and since then, has been suffering the effects of long COVID which have caused his fatigue.  (If this is indeed long COVID, discussed with patient and wife about new trials looking at treating long COVID successfully with SSRIs.  This may be something to consider as per patient's PCP.)   See below in regards to prednisone and methotrexate.   For completeness sake, ordered CMV, Epstein-Barr, [place] spotted fever and Lyme disease titers to look for other causes of fatigue.  (Addendum: Following discharge, these titers all came back negative.)   Polymyalgia rheumatica (HCC) Patient has been on 5 mg of prednisone for his polymyalgia rheumatica for some time.  The viral illness that he contracted last summer triggered a  flareup causing joint pain.  This was treated with increasing prednisone dose to 25 mg daily.   There appears to be some confusion in the efficacy of this treatment.  Patient's joint pain did indeed respond to elevated prednisone dose, however attempts to reduce prednisone dose led to worsening of fatigue.  Had extensive  discussion with patient and wife that the fatigue and joint pain are likely unrelated.  Patient probably felt worse with prednisone decreased as prednisone can often cause a feeling of increased energy and wakefulness.   There may have been confusion between patient and providers that patient unable to wean off prednisone due to failure of therapy.  Therefore, discontinuing methotrexate.  Weaning down prednisone very slowly over the next month to get patient back to 5 mg p.o. daily.  Fatigue appears to be independent and as above recommended treatment.  INPATIENT PULM COJSULT 04/06/23   Acute Hypoxic Respiratory Failure in setting possibly of multifocal pna vs eosinophilic pna versus organizing pneumonia Acute PE  PMR on chronic prednisone, immunocompromised  Patient remains on high flow nasal cannula oxygen, unable to titrated down currently Continue IV antibiotics, currently on vancomycin and cefepime, infectious disease following Started on high-dose steroid with Solu-Medrol 125 mg every 6 hours with suspected inflammatory lung disease Follow-up autoimmune panel Will try to get sputum culture and sputum pneumocystis PCR Ideally patient needs bronchoscopy with BAL and transbronchial biopsy but due to high oxygen requirement, will currently hold off Continue subcu Lovenox therapeutic dose Monitor intake and output  Xxxx Renal visit [facility] 02/07/23  Pt reports he was diagnosed with PMR 30 years ago with large proximal aches. He was started on prednisone and felt immediately better. For the next 30 years he took 5 mg a day of prednisone and did very well. 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 In June of last year he developed a virus which consisted of profound fatigue and malaise. This caused his PMR to "explode". He had loss of energy and his sed rate went up. He no longer wanted to exercise. His ESR went up to 101 and his prednisone was increased to 30 mg daily. He felt somewhat better on  this higher dose but not back to normal.   Somewhere in this interim he developed a femoral nerve neuropathy that resolved with aggressive PT.   In August he developed covid with a 102 fever. He did not require hospitalization.   On 2/21 he followed up with rheumatology who recommended tapering the prednisone. He was cut down to 25 mg. He was started on weekly methotrexate but only took one dose.   He was admitted to [facility] at the end of February due to low BP (100 systolic; normal for him is mildly hypertensive), chills, and temp to 101-102. . In the emergency room, patient noted to have normal lactic acid and a white blood cell count of 14.5 as well as a sed rate of 78. During patient's entire hospitalization, was not febrile. Unclear etiology if he has any infection. Chest x-ray and urinalysis unremarkable. White blood cell count elevated, but mildly and in the context of patient being on high-dose of steroids continuously. Blood cultures drawn and patient was given 1 dose of cefepime and vancomycin. Patient otherwise asymptomatic (other than complaints of fatigue). They stopped the methotrexate at the ER.   Since the hospitalization he's been even more fatigued. Denies red eyes, ear pain, severe sinus symptoms, hemoptysis, SOB, chest pain, significant GI symptoms, difficulty urinating, hematuria,  leg swelling, skin rashes, fevers.  Eating/drinking fine. No typical PMR muscle pains, no joint pains. He does endorse night sweats requiring him to change his pajamas.   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 He remains very active with [employer]. He has been a lifelong athlete  Xxxxxxxxxxxxxxxxxxxxxxxxxxxxxxxxxx  [facility] RHEUM VISIT [phone]  Javarius Slavey is a 89 y.o. male with a history of PMR who presents for evaluation of fatigue, fevers, and hematuria noted to have a dilated thoracic aorta and abdominal aortic aneurysm, treated in past with endovascular repair. DDx for his presentation includes  possible large vessel vasculitis, infection of the aorta. We talked about the possibility of GCA today, but he does not have any cranial symptoms. GCA would also not explain the red cell casts in his urine today. He may have ANCA vasculitis, which would potentially tie together his lung nodules and urine findings. He is seeing Dr. Dolores Frame in ID today to rule out infectious causes given his recent fevers so out visit was abbreviated.  - check ANCA panel today - consider PET scan - taper prednisone - ID appointment today - Discussed with Dr. Alberteen Spindle potential admission to hospital to expedite workup   xxxxxxxxxxxxxxxxxxxxxxxxxxxxxxxxxxxxxxxxxxxxxxxxxxxxxxxxxxxxxxxxxxxxx Admit date: 03/05/2023  -Discharge date: 03/23/2023  AZREAL RIDGELL is a 89 y.o. male with medical history significant for polymyalgia rheumatica on chronic prednisone therapy, AAA status post endovascular stent graft in September 2018, chronic diastolic heart failure, recent diagnosis of aortitis, chronic leukocytosis, who is admitted to [facility] on 03/05/2023 with acute pulmonary embolism after presenting from home to [facility] ED complaining of generalized weakness.    [facility] for generalized weakness.  Subsequently, he was admitted to [facility] with similar initial complaint, before being discharged home on 02/13/2023.  During hospitalization at [facility], there was reportedly concern for potential aortitis, for which the patient was started on long-term current spectrum of antibiotics via PICC line, including daptomycin, Rocephin, and Bactrim.  He notes good interval compliance with these antibiotics.   However, over the last 1 to 2 days, he is noted progressive generalized weakness in the absence of any acute focal weakness.  He qualifies his generalized weakness is being significant enough to prevent him from getting out of bed over the last day, which is quite different  than his baseline functionality and activity level.  Denies any associated acute focal weakness.    He has a history of polymyalgia rheumatica and has been on chronic steroid therapy for multiple decades.  Recently, there have been outpatient attempts at reducing his dose of chronic prednisone, and he notes that the most recent dose adjustment down to 15 mg of daily prednisone occurred a few days prior to the onset of his presenting generalized weakness.   He denies any recent subjective fever, chills, rigors, or generalized myalgias.  Denies any new headache, neck stiffness, rash, cough, hemoptysis, shortness of breath, abdominal pain, diarrhea, dysuria or gross hematuria.  He also denies any recent chest pain, palpitations, diaphoresis, nausea, vomiting, dizziness, presyncope, or syncope.  No recent worsening of peripheral edema, or any recent/new lower extremity erythema or calf tenderness.  Denies any known history of prior pulmonary embolism.   Aside from the 2 hospitalizations over the last 6 weeks, denies any additional periods of prolonged diminished ambulatory activity as of recent.  No recent trauma.  No known history of underlying malignancy.  Not on any blood  thinners as an outpatient, including no aspirin.   Per chart review, it appears that he also has chronic leukocytosis with chart review revealing most recent prior white blood cell count of 18.8 on 02/28/2023, with other blood cell data points notable for 25.4 on 02/07/2023.   Denies any known baseline supplemental oxygen requirements.   ESR 105 compared to most recent prior value of 69 on 01/25/2023, CRP 12.8 compared to 75 on 02/28/2023. CBC notable for the following: Open cell count 20,000 with 89% neutroph     CTA chest, relative to CT chest from 12/02/2022, per formal radiology read, shows acute pulmonary embolism at the distal right main pulmonary artery extending into the right middle and lower pulmonary arteries, with CT evidence of  right heart strain.  Additionally, CTA chest shows pulmonary parenchymal findings suggestive of multifocal pneumonia superimposed on a background of interstitial lung disease as well as small bilateral pleural effusions.  CTA chest also shows unchanged aneurysmal dilation of the ascending, transverse, and descending thoracic aorta, without any corresponding evidence of dissection.   EDP discussed the patient's case with the on-call infectious disease physician, Dr. Zenaida Niece dam, he felt that acute pneumonia was slightly less likely given the patient's broad-spectrum IV antibiotics over the last several weeks, including daptomycin, Rocephin, and Bactrim.  He did not recommend changing daptomycin to IV vancomycin, but rather recommended continuation of daptomycin.  He did however recommend consideration for escalation of Rocephin to cefepime, with no additional changes recommended per infectious disease at this tim   Latest Reference Range & Units 03/06/23 21:40  ANA Ab, IFA  Negative  Anti JO-1 0.0 - 0.9 AI <0.2  CCP Antibodies IgG/IgA 0 - 19 units 2  RA Latex Turbid. <14.0 IU/mL 26.0 (H)  Cytoplasmic (C-ANCA) Neg:<1:20 titer <1:20  P-ANCA Neg:<1:20 titer <1:20  Atypical P-ANCA titer Neg:<1:20 titer <1:20  Anti-MPO Antibodies 0.0 - 0.9 units <0.2  Anti-PR3 Antibodies 0.0 - 0.9 units <0.2  C3 Complement 82 - 167 mg/dL 161  Complement C4, Body Fluid 12 - 38 mg/dL 28  SSA (Ro) (ENA) Antibody, IgG 0.0 - 0.9 AI <0.2  SSB (La) (ENA) Antibody, IgG 0.0 - 0.9 AI <0.2  Scleroderma (Scl-70) (ENA) Antibody, IgG 0.0 - 0.9 AI 0.4  (H): Data is abnormally high  Hospital Course:  #1 acute hypoxic respiratory failure in the setting of multifocal pneumonia versus eosinophilic pneumonia from daptomycin versus interstitial lung disease/autoimmune process/acute PE with right lower extremity DVT in the setting of PMR on chronic steroids/immunocompromise state -Patient was seen in consultation by ID, daptomycin  discontinued on 03/05/2023 due to concerns for possible eosinophilic pneumonia. -Patient with improvement currently on 1 L nasal cannula with sats of 97% at rest. -Patient noted to have been placed on high dose steroids which are being tapered down patient currently on prednisone 40 mg daily recommended to continue for the next 3 to 4 weeks at this dose until seen by PCCM in the outpatient setting. -ID recommended continuation of IV Rocephin, oral doxycycline, Bactrim for PJP prophylaxis. -Patient completed course of antibiotics 03/23/2023. -ID recommending continuation of Bactrim for pneumocystis prophylaxis while on high-dose steroids. -Will need outpatient follow-up with ID at [facility] postdischarge.   2.  Paroxysmal atrial fibrillation/atrial flutter -Seen in consultation by cardiology and felt likely secondary to acute pulmonary issues. -Noted to have converted on IV amiodarone which was continued for 24 hours and subsequently discontinued. -Patient subsequently maintained on low-dose Lopressor for rate control, Eliquis for anticoagulation.  3.  PE/right lower extremity DVT -Noted on CT angiogram chest. -Patient noted to have elevated troponins were likely secondary to demand ischemia. -Lower extremity Dopplers done positive for right lower extremity DVT. -2D echo done with abnormal septal motion, EF 55 to 60%,NWMA, no evidence of cor pulmonale in the setting of recent PE. -Patient initially placed on IV heparin and subsequently transition to Eliquis.   -Patient improved clinically.   -Patient will be discharged on Eliquis.   -Outpatient follow-up with pulmonary and PCP.    4.  AAA status post graft and possible aortitis -Patient noted to have been on daptomycin per ID at [facility] which was discontinued during this hospitalization due to concerns for eosinophilic pneumonia. -Patient received a full course of IV Rocephin and IV doxycycline which was completed during the hospitalization on day of  discharge.  -Patient also maintained on Bactrim for PJP prophylaxis per ID recommendations while on steroids.  Patient will be discharged on Bactrim for PJP prophylaxis. -Outpatient follow-up with ID at [facility].   5.  Acute delirium/anxiety -Possibly secondary to hospital delirium versus from hypoxia versus steroid-induced. -Improved clinically.      6.  PMR -Patient noted to chronically be on 5 mg of prednisone at home was on high-dose steroids and tapered down to 40 mg of prednisone daily secondary to problem #1 by day of discharge.  Patient be discharged on prednisone 40 mg daily until follow-up with pulmonary.     8.  Anemia of chronic disease -H&H stable. -No overt bleeding.   9. chronic leukocytosis -Patient noted to have a chronic leukocytosis could be secondary to steroid induced. -Labs with some improvement with leukocytosis by day of discharge.   -Patient completed course of antibiotics during the hospitalization, remained afebrile.   -Outpatient follow up with hematology for follow-up on leukocytosis.   -Ambulatory referral placed. -Patient will be discharged to a skilled nursing facility.   10.  GERD -Patient maintained on PPI, Pepcid, Zofran.         13.  Pressure injury, not present on admission Pressure Injury 03/12/23 Sacrum Mid Stage 1 -  Intact skin with non-blanchable redness of a localized area usually over a bony prominence. Pink non blanching on sacrum (Active)    Previous [facility] pulmonary encounter:  05/01/2023- Dr. Francine Graven     Chief Complaint  Patient presents with   Hospitalization Follow-up      HFU. Still using 4L of O2.     Zamari Aurigemma is a 89 year old male with history of polymyalgia rheumatica on chronic prednisone and recent hospitalization at [facility] where he was diagnosed with aortitis and started on daptomycin and rocephin via PICC line on 3/14. He presented to [facility] on 4/7 with generalized weakness and shortness of breath. CTA Chest on  admission showed pulmonary embolism and bilateral patchy ground-glass and consolidative opacities with interlobular septal thickening superimposed on back ground subpleural reticulation and bronchiolectasis. Echocardiogram did not show RV strain. He was started on heparin drip. He required transfer to the ICU for acute hypoxemic respiratory failure with heated high flow oxygen requirements. He was started on pulse dose steroids and broad spectrum antibiotics. Bronchoscopy was not performed due to his significant oxygen requirements. He was transferred out of the ICU on 4/18 with decreasing O2 requirements. He was discharged on 40mg  of prednisone daily and bactrim prophylaxis. He was started on nystatinc for thrush. He was discharged on 1-2L of oxygen and went to rehab.    He started  having bowel trouble at [facility] rehab for about a month with constipation. He has been started on aggressive bowel regimen and has required manual disimpaction per his wife. He had large explosive bowel movement in clinic today.    His prednisone was reduced to 20mg  daily at rehab due to concerns of intolerance of high dose therapy. He continues bactrim DS 1 tab 3 days per week.    He is working on walking with a walker, he walked 32 steps yesterday without assistance.    He has reduced appetite and is trying to drink protein supplements daily.     06/14/2023 - Interim hx Patient presents today for 1 month follow-up/acute office visit.  He was seen by Dr. Francine Graven on 05/01/2023 hospital follow-up due to respiratory failure in the setting of interstitial lung disease.  Patient has a history of Polymyalgia rheumatica on chronic prednisone.  Differential for respiratory failure includes eosinophilic pneumonia in the setting of daptomycin use versus progressive ILD process.  Patient has mild basilar reticulation on CT chest from March 2022.  Inflammatory workup has been negative.  Follow-up chest x-ray on 05/01/2023 showed patchy  bilateral interstitial and alveolar opacity bilaterally not significantly changed compared to prior chest x-ray from April 2024.    Accompanied by his wife and nursing aid today Breathing is about the same  He is on supplemental oxygen, for the most part he is using 1L oxygen at home  He is on 20mg  prednisone daily with Bactrim double strength 1 tablet 3 days/week for pneumocystitis prophylaxis.  Need to discuss prednisone taper if able    Patient had UTI symptoms (frequency/urgency, fever and confusion) on July 6. He had some air hunger at this time. No associated URI symptoms or cough. Prescribed abx Augmentin x 10 days and doxycycline x10 days. CXR done with Dr. Waynard Edwards, results are not accessible in Epic. He is doing better. He is more tired last several days. He is having some stomach/abdomen discomfort. He does struggle with constipation.  He had a small bowel movement today.  He has been compliant with stool softener.  Plan  - reduce prednisone to 15mg  per day  OV 07/04/2023 -transfer of care to Dr. Marchelle Gearing in the ILD center.  Subjective:  Patient ID: [name], male , DOB: 1930-01-08 , age 37 y.o. , MRN: [id number] , ADDRESS: [address] [place] [address] PCP [name], MD Patient Care Team: [name], MD as PCP - General (Internal Medicine) Little Ishikawa, MD as PCP - Cardiology (Cardiology) Holli Humbles, MD as Referring Physician (Ophthalmology) Pryor Ochoa, MD (Inactive) as Consulting Physician (Vascular Surgery) Hilarie Fredrickson, MD as Consulting Physician (Gastroenterology)  This Provider for this visit: Treatment Team:  Attending Provider: Kalman Shan, MD    07/04/2023 -   Chief Complaint  Patient presents with   Hospitalization Follow-up    New pt. And advise on health     HPI KATRON SCHERZER 89 y.o. 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  Here with his wife Darl Pikes and also caretaker Bonita Quin.  History is provided by the  wife, caretaker, a little bit also from the patient also review of the external medical record.  All this was done on 07/03/2023.  Subsequent history also taken on 07/04/2023 the following day from [place].  Neither primary care physician.  The very complex story.  It appears that even at baseline he has had some mild atelectasis changes in his lower lung fields but this was essentially asymptomatic  and he was caring about his activities of daily living.   Has chronic history of polymyalgia rheumatica and has been on low-dose steroids for a few decades.  He has had persistently elevated ESR at least between 70-100 for the last 2 years according to PCP.  At some point because of possible PMR flare he was subjected increased prednisone dose but he always had this fatigue.   Review of the records indicate that starting February 2024 he started having leukocytosis and new onset anemia.  He did have an admission in the hospital at the time for fever.  Subsequently had extensive workup in the spring 2024 at [facility].  He had an admission for this.  They have an considered whether he might have aortitis.  This was in mid March 2024.  He was then treated with daptomycin in the hospital.  He was discharged in early April 2024 with a PICC line but shortly after that at home he collapsed.  Was diagnosed with submassive PE and interstitial pneumonitis.  He had no evidence of RV strain on the echo but his RV-LV ratio was 1.14.  Is on high flow oxygen.  Was given high-dose steroids.  During this time had ICU delirium including according to both primary care physician and D Vandam the infectious disease physician that I spoke to neuro suicidal ideations.  He was then discharged to [facility] where he spent 9 weeks and finally got home on 05/31/2023.  He is currently undergoing intense physical therapy at home.  During all this his high flow oxygen needs have improved he is such that he is only on 1-2 L nasal  cannula at rest.  The caretaker states that sometimes even at rest he is got normal pulse ox and he can go several hours without using oxygen.  He is able to get around with a walker but when he walks a certain distance such as 30 to 40 feet he will get extremely tired.   Overall the wife initially said that he was not any better but they did admit that he is now more conditioned.  He is also having reduced oxygen needs but the fatigue is the main component.  In talking to primary care physician as well fatigue seems to be the biggest component.  In fact a few days ago primary care physician called him in and had his hemoglobin checked it was 7 g%.  He just finished 2 units of transfusion.  He is somewhat better but still feeling extremely fatigued.  His leukocytosis continues and wife is frustrated about this and feels this might be associated with the fatigue.  In terms of his PMR and ILD his current prednisone treatment is at 15 mg/day.  This was slowly tapered since his hospitalization in April 2024.  Dr. Particia Jasper had instructed him to take 15 mg/day.  This was a clinical taper.  Despite different doses of prednisone his ESR is always continue to be high and the reason for this is not known.  In terms of pulmonary embolism he continues his Eliquis low dose His last echocardiogram was in April 2024.  His EF is 55 to 60% and had no evidence of RV strain.   OV 07/10/2023  Subjective:  Patient ID: [name], male , DOB: December 31, 1929 , age 58 y.o. , MRN: [id number] , ADDRESS: [address] [place] [address] PCP [name], MD Patient Care Team: [name], MD as PCP - General (Internal Medicine)  Little Ishikawa, MD as PCP - Cardiology (Cardiology) Holli Humbles, MD as Referring Physician (Ophthalmology) Pryor Ochoa, MD (Inactive) as Consulting Physician (Vascular Surgery) Hilarie Fredrickson, MD as Consulting Physician (Gastroenterology)  This Provider for this  visit: Treatment Team:  Attending Provider: Kalman Shan, MD  Type of visit: Video Virtual Visit Identification of patient QUENTEN STEERS with 1930/08/10 and MRN [id number] - 2 person identifier Risks: Risks, benefits, limitations of telephone visit explained. Patient understood and verbalized agreement to proceed Anyone else on call: His wife.  Also the caretaker.  He himself could not join Patient location: His home This provider location: [address], [address]; [place]; [place] [address]. [facility]. [phone]    07/10/2023 -follow-up respiratory failure.   HPI MIKHAEL VANDERSLOOT 89 y.o. - In this video visit the purpose is to discuss CT scan results and then to catch up.  I personally visualized the CT scan over time and also showed it to the wife and the caretaker.  He has had some basal chronic ILD changes even 2022 in 2021.  It was extremely mild and could have been passed off as atelectasis.  He also had this in early 2024.  However clearly in April 2024 he had significant groundglass opacities and alveolar airspace filling.  Dense consolidation.  This could easily have been Boop with a high ESR but then his ESR is been chronically elevated for over 2 years according to PCP.  He has responded to steroids.  His lungs have improved and left with residual scar although the level of residual scar seems worse than his baseline.  There is currently very little any groundglass opacities currently on 15 mg prednisone since mid July 2024.  He is stable on 1 L nasal cannula occasionally 2 L according to caretaker.  We took a shared decision making to reduce to 10 mg/day [his baseline was 5 mg to-10 mg/day for his PMR].  Did indicate to the wife there is a small chance that things could flareup but overall this chronic toxicity to deal with with prednisone and therefore we took a shared decision making to reduce.  Regarding his other issues such as fatigue, anemia, other  reasons for elevated ESR he is seeing hematology.  I have also spoken infectious disease doctor.  If he has a bone marrow biopsy they will do cultures.  His QuantiFERON gold was indeterminate.  I spoke to Dr. Algis Liming he does not think patient has TB.  I agree with that.  His anemia also appears to have improved.  PFT      No data to display            LAB RESULTS last 96 hours No results found.    has a past medical history of AAA (abdominal aortic aneurysm) (HCC), Abnormal EKG, Allergic rhinitis, Allergy, Benign neoplasm of colon (04/14/2010), Carpal tunnel syndrome, Cataract, Decubitus ulcer of coccygeal region, stage 2 (HCC) (07/06/2023), Degenerative disc disease, Disturbances metabolism of methionine, homocystine, and cystathionine (HCC), Elevated prostate specific antigen (PSA), Elevated PSA (07/06/2023), Hearing loss, Hyperlipidemia, ILD (interstitial lung disease) (HCC) (07/06/2023), Impotence of organic origin, Internal hemorrhoids, Leukocytosis (07/06/2023), Neck pain, Otosclerosis of both ears, Peripheral vascular disease (HCC), Plantar fasciitis, Polymyalgia rheumatica (HCC), Polymyalgia rheumatica (HCC) (07/06/2023), Rotator cuff syndrome of left shoulder, Scoliosis, and Shoulder pain.   reports that he has never smoked. He has never used smokeless tobacco.  Past Surgical History:  Procedure Laterality Date   ABDOMINAL AORTIC  ENDOVASCULAR STENT GRAFT N/A 08/16/2017   Procedure: ABDOMINAL AORTIC ENDOVASCULAR STENT GRAFT insertion;  Surgeon: Nada Libman, MD;  Location: [facility] OR;  Service: Vascular;  Laterality: N/A;   Actinic keratosis removal  06/21/2010   Left shoulder; Dr. 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   Procedure: DUPUYTREN CONTRACTURE RELEASE RIGHT LONG, RING AND SMALL FINGERS;  Surgeon: Wyn Forster., MD;  Location: [facility];  Service: Orthopedics;  Laterality: Right;    Implant penile pump  2001   IR ANGIOGRAM PELVIS SELECTIVE OR SUPRASELECTIVE  12/11/2020   IR ANGIOGRAM SELECTIVE EACH ADDITIONAL VESSEL  12/11/2020   IR EMBO ARTERIAL NOT HEMORR HEMANG INC GUIDE ROADMAPPING  12/11/2020   IR RADIOLOGIST EVAL & MGMT  07/30/2020   IR US GUIDE VASC ACCESS RIGHT  12/11/2020   Resection of appendix and tip of rectum  February 2005   STAPEDECTOMY Bilateral 1985, 1988   [employer]    Allergies  Allergen Reactions   Daptomycin Other (See Comments)    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   Rosuvastatin Other (See Comments)    Stopped taking due to feeling achy     Codeine Nausea And Vomiting   Morphine And Codeine Nausea And Vomiting   Omnicef [Cefdinir] Other (See Comments)    Abdominal pain    Immunization History  Administered Date(s) Administered   Dtap, Unspecified 06/29/2010   Influenza, High Dose Seasonal PF 09/01/2017, 08/31/2022   Influenza, Quadrivalent, Recombinant, Inj, Pf 09/01/2018, 08/31/2019, 08/19/2020, 09/24/2021   Influenza,inj,Quad PF,6+ Mos 08/20/2013, 08/26/2014, 09/23/2015, 10/04/2016   Influenza-Unspecified 09/22/2006, 09/26/2007, 10/13/2012   PFIZER(Purple Top)SARS-COV-2 Vaccination 12/10/2019, 12/28/2019, 08/10/2020   Pneumococcal Conjugate-13 05/30/2018   Pneumococcal Polysaccharide-23 07/21/1998, 10/26/2022   Tdap 06/29/2010    Family History  Problem Relation Age of Onset   Heart disease Mother    Emphysema Mother    Leukemia Brother        Chronic lymphocytic leukemia   Colon cancer Neg Hx    Esophageal cancer Neg Hx    Rectal cancer Neg Hx    Stomach cancer Neg Hx      Current Outpatient Medications:    acetaminophen (TYLENOL) 325 MG tablet, Take 2 tablets (650 mg total) by mouth every 6 (six) hours as needed for mild pain (or Fever >/= 101)., Disp: , Rfl:    apixaban (ELIQUIS) 2.5 MG TABS tablet, Take 2.5 mg by mouth 2 (two) times daily., Disp: , Rfl:    bisacodyl 5 MG EC tablet, Take 10 mg by mouth daily as needed  for moderate constipation., Disp: , Rfl:    butalbital-acetaminophen-caffeine (FIORICET) 50-325-40 MG tablet, Take 1 tablet by mouth daily as needed for headache., Disp: 14 tablet, Rfl: 0   cholecalciferol (VITAMIN D3) 25 MCG (1000 UNIT) tablet, Take 1,000 Units by mouth daily., Disp: , Rfl:    diclofenac Sodium (VOLTAREN) 1 % GEL, Apply 2 g topically 4 (four) times daily as needed (shoulder pain)., Disp: 2 g, Rfl: 0   docusate sodium (COLACE) 100 MG capsule, Take 100 mg by mouth 2 (two) times daily., Disp: , Rfl:    doxycycline (VIBRAMYCIN) 100 MG capsule, Take 100 mg by mouth 2 (two) times daily. (Patient not taking: Reported on 07/06/2023), Disp: , Rfl:    feeding supplement (ENSURE ENLIVE / ENSURE PLUS) LIQD, Take 237 mLs by mouth 3 (three) times daily between meals., Disp: 237 mL, Rfl: 12   ferrous  sulfate 324 MG TBEC, Take 324 mg by mouth daily. As needed, Disp: , Rfl:    finasteride (PROSCAR) 5 MG tablet, Take 5 mg by mouth daily., Disp: , Rfl:    fish oil-omega-3 fatty acids 1000 MG capsule, Take 1,000 mg by mouth daily. , Disp: , Rfl:    fluorometholone (FML) 0.1 % ophthalmic suspension, Place 1 drop into the right eye at bedtime. , Disp: , Rfl:    L-Methylfolate-B12-B6-B2 (METAFOLBIC) 04-28-49-5 MG TABS, TAKE ONE TABLET TWICE DAILY (Patient taking differently: Take 1 tablet by mouth 2 (two) times daily.), Disp: 60 tablet, Rfl: 2   LORazepam (ATIVAN) 0.5 MG tablet, Take 0.5 mg by mouth daily. 0.25 am, 0.25 pm,  0.5 at night, Disp: , Rfl:    magnesium hydroxide (MILK OF MAGNESIA) 400 MG/5ML suspension, Take by mouth daily as needed for mild constipation., Disp: , Rfl:    melatonin 3 MG TABS tablet, Take 1 tablet (3 mg total) by mouth at bedtime. (Patient taking differently: Take 5 mg by mouth as needed.), Disp: , Rfl: 0   metoprolol succinate (TOPROL XL) 25 MG 24 hr tablet, Take 1 tablet (25 mg total) by mouth daily., Disp: 90 tablet, Rfl: 3   mirtazapine (REMERON) 15 MG tablet, Take 15 mg by  mouth at bedtime. 22.5 mg, Disp: , Rfl:    Multiple Vitamins-Minerals (MULTIVITAMIN PO), Take 1 tablet by mouth daily. , Disp: , Rfl:    pantoprazole (PROTONIX) 40 MG tablet, Take 1 tablet (40 mg total) by mouth 2 (two) times daily for 30 days, THEN 1 tablet (40 mg total) daily., Disp: 420 tablet, Rfl: 0   polyethylene glycol (MIRALAX / GLYCOLAX) 17 g packet, Take 17 g by mouth 2 (two) times daily., Disp: , Rfl:    predniSONE (DELTASONE) 20 MG tablet, Take 2 tablets (40 mg total) by mouth daily with breakfast. (Patient taking differently: Take 15 mg by mouth daily with breakfast. 15mg  daily), Disp: , Rfl:    senna-docusate (SENOKOT-S) 8.6-50 MG tablet, Take 1 tablet by mouth 2 (two) times daily., Disp: , Rfl:    tamsulosin (FLOMAX) 0.4 MG CAPS capsule, Take 0.4 mg by mouth at bedtime., Disp: , Rfl:    [place] 1200 MG CAPS, Take 1 tablet by mouth daily., Disp: , Rfl:    traMADol (ULTRAM) 50 MG tablet, Take 50 mg by mouth every 4 (four) hours as needed for moderate pain or severe pain., Disp: , Rfl:    zolpidem (AMBIEN) 10 MG tablet, Take 5 mg by mouth at bedtime as needed for sleep., Disp: , Rfl:       Objective:   There were no vitals filed for this visit.  Estimated body mass index is 19.94 kg/m as calculated from the following:   Height as of 07/03/23: 6' (1.829 m).   Weight as of 07/05/23: 147 lb (66.7 kg).  @WEIGHTCHANGE @  There were no vitals filed for this visit.   Physical Exam  He was not present      Assessment:     No diagnosis found.     Plan:     Patient Instructions     ICD-10-CM   1. Interstitial lung disease (HCC)  J84.9     2. Physical deconditioning  R53.81      Interstitial lung disease (HCC) DOE  - you have inflammation in lung since 2021/2022: This flared up in April 2024 because of the PE and likely daptomycin. Possible covid last year played a role. Currently improved  and needing room air to 1L at rest and night 1L at night. HRCT aug 2024 with  improvement from April 2024 but with inreased residual scar compared to baseline  - Likely has IPF that flare up with daptomycin but possible he had BOOP  Plan  - reduce prednisone to 10mg  per day and hold at this dose  - see if lungs flare up with lowerd prednisone - too high risk for lung biopsy but could consider BAL sometime in future - too high risk for anti-fibrotics  - increae o2 to 2L [place] at night and see if this helps fatigue  Pulmonary embolism April 2024; submassive with RV/LV ratio 1.14  -Currently on Eliquis and tolerating well but having ongoing anemia that predated pulmonary embolism - noted per PCP stool occult blood positive August 2024 and on low dose eliquis  Plan -Et an echocardiogram [ordered 07/04/2023] -PCP will address Eliquis on follow-up later in August 2024  -If anemia is a concern can consider stopping [less preferred] versus low-dose [more preferred] -keep appt with DR Bjorn Pippin    Elevated ESR x 2  year per PCP [name], MD reported 07/04/2023  - maybe no longer related to PMR annd/or ILD - maybe another cause   Plan  - monitor with reducing prednisone   Anemia and leukocytosis- new onset February 2024 and ongoing as of August 2024   S/p 2 units packed red blood cells early August 2024  Per PCP [name], MD -stool occult blood positive late July/early August 2024  Plan - [facility] Dr Leonides Schanz  Elevated PSA 07/03/2023  Plan  - Results shared with primary care physician on 07/04/2023 who wlll address  Physical deconditioning Fatigue  This is multifactorial -and likely due to posthospitalization, steroid related myopathy, medical illnesses [anemia, age, pulmonary issues] and age-related sarcopenia made worse by medical illnesses.. THis is area to focus on most  Plan  - continue PT - optimize anemia - per PCP [name], MD   Followup  - face to face visit in  2weeks ; 30 min   FOLLOWUP Return in about 2 weeks (around 07/24/2023)  for 30 min visit, ILD, with Dr Marchelle Gearing, Face to Face Visit.  (Level 04 E&M 2024: Estb >= 30 min visit type: on-site physical face to visit visit spent in total care time and counseling or/and coordination of care by this undersigned MD - Dr Kalman Shan. This includes one or more of the following on this same day 07/10/2023: pre-charting, chart review, note writing, documentation discussion of test results, diagnostic or treatment recommendations, prognosis, risks and benefits of management options, instructions, education, compliance or risk-factor reduction. It excludes time spent by the CMA or office staff in the care of the patient . Actual time is 30 min)   SIGNATURE    Dr. Kalman Shan, M.D., F.C.C.P,  Pulmonary and Critical Care Medicine Staff Physician, [facility] Center Director - Interstitial Lung Disease  Program  Pulmonary Fibrosis [facility] at [facility] [place], [place], [address]  Pager: [phone], If no answer or between  15:00h - 7:00h: call [phone] Telephone: [phone]  5:41 PM 07/10/2023

## 2023-07-10 NOTE — Progress Notes (Signed)
You are seeing this patient today.

## 2023-07-11 ENCOUNTER — Telehealth: Payer: Medicare Other | Admitting: Internal Medicine

## 2023-07-11 DIAGNOSIS — I4892 Unspecified atrial flutter: Secondary | ICD-10-CM | POA: Diagnosis not present

## 2023-07-11 DIAGNOSIS — I2699 Other pulmonary embolism without acute cor pulmonale: Secondary | ICD-10-CM | POA: Diagnosis not present

## 2023-07-11 DIAGNOSIS — I480 Paroxysmal atrial fibrillation: Secondary | ICD-10-CM | POA: Diagnosis not present

## 2023-07-11 DIAGNOSIS — J8282 Acute eosinophilic pneumonia: Secondary | ICD-10-CM | POA: Diagnosis not present

## 2023-07-11 DIAGNOSIS — I5032 Chronic diastolic (congestive) heart failure: Secondary | ICD-10-CM | POA: Diagnosis not present

## 2023-07-11 DIAGNOSIS — I82401 Acute embolism and thrombosis of unspecified deep veins of right lower extremity: Secondary | ICD-10-CM | POA: Diagnosis not present

## 2023-07-11 NOTE — Telephone Encounter (Signed)
Patient had video visit yesterday and Angelique Blonder from [place] wanted to confirm that his predtnsone was decreased. She can be reached at [phone]

## 2023-07-12 ENCOUNTER — Ambulatory Visit: Payer: Medicare Other | Admitting: Pulmonary Disease

## 2023-07-12 ENCOUNTER — Encounter (HOSPITAL_BASED_OUTPATIENT_CLINIC_OR_DEPARTMENT_OTHER): Payer: Medicare Other | Attending: General Surgery | Admitting: General Surgery

## 2023-07-12 ENCOUNTER — Telehealth: Payer: Self-pay | Admitting: Physician Assistant

## 2023-07-12 DIAGNOSIS — R700 Elevated erythrocyte sedimentation rate: Secondary | ICD-10-CM | POA: Diagnosis not present

## 2023-07-12 DIAGNOSIS — Z7952 Long term (current) use of systemic steroids: Secondary | ICD-10-CM | POA: Insufficient documentation

## 2023-07-12 DIAGNOSIS — M353 Polymyalgia rheumatica: Secondary | ICD-10-CM | POA: Diagnosis not present

## 2023-07-12 DIAGNOSIS — R5381 Other malaise: Secondary | ICD-10-CM | POA: Insufficient documentation

## 2023-07-12 DIAGNOSIS — D72829 Elevated white blood cell count, unspecified: Secondary | ICD-10-CM | POA: Diagnosis not present

## 2023-07-12 DIAGNOSIS — Z86718 Personal history of other venous thrombosis and embolism: Secondary | ICD-10-CM | POA: Insufficient documentation

## 2023-07-12 DIAGNOSIS — L89153 Pressure ulcer of sacral region, stage 3: Secondary | ICD-10-CM | POA: Insufficient documentation

## 2023-07-12 NOTE — Telephone Encounter (Signed)
I called and spoke to patient's wife, [name] on 07/11/2023 to review the lab results from 07/05/2023. Findings again show persistent leukocytosis with WBC 17.4, slight improved from 07/03/2023. There is stable anemia with Hgb 9.4 and improved thrombocytosis with Plt count of 479K. Remaining workup did show persistent iron deficiency anemia so recommend IV iron to bolster levels. In addition, there SPEP did detect monoclonal protein measuring 0.3 g/dL. There is no evidence of myeloproliferative neoplasm or BCR/ABL rearrangement.   Discussed next steps with a bone marrow biopsy to further evaluate leukocytosis and anemia in the setting of monoclonal gammopathy. Patient's wife would like some time to discuss with [name] before making decision on bone marrow biopsy but agreed to proceed with IV iron infusion. I will reach the patient and wife in the next 1-2 days to follow up on decision about a bone marrow biopsy.   [name] expressed understanding of the plan provided.

## 2023-07-13 ENCOUNTER — Telehealth: Payer: Self-pay | Admitting: Physician Assistant

## 2023-07-13 DIAGNOSIS — I82401 Acute embolism and thrombosis of unspecified deep veins of right lower extremity: Secondary | ICD-10-CM | POA: Diagnosis not present

## 2023-07-13 DIAGNOSIS — I2699 Other pulmonary embolism without acute cor pulmonale: Secondary | ICD-10-CM | POA: Diagnosis not present

## 2023-07-13 DIAGNOSIS — I480 Paroxysmal atrial fibrillation: Secondary | ICD-10-CM | POA: Diagnosis not present

## 2023-07-13 DIAGNOSIS — J8282 Acute eosinophilic pneumonia: Secondary | ICD-10-CM | POA: Diagnosis not present

## 2023-07-13 DIAGNOSIS — D509 Iron deficiency anemia, unspecified: Secondary | ICD-10-CM

## 2023-07-13 DIAGNOSIS — I5032 Chronic diastolic (congestive) heart failure: Secondary | ICD-10-CM | POA: Diagnosis not present

## 2023-07-13 DIAGNOSIS — I4892 Unspecified atrial flutter: Secondary | ICD-10-CM | POA: Diagnosis not present

## 2023-07-13 NOTE — Progress Notes (Addendum)
[name] ([id number]) [id number] Page 1 of 9 Visit Report for 07/12/2023 Chief Complaint Document Details Patient Name: Date of Service: [name], [name]. 07/12/2023 1:00 PM Medical Record Number: [id number] Patient Account Number: [id number] Date of Birth/Sex: Treating RN: 1930/04/14 (89 y.o. M) Primary Care Provider: Ezequiel Alexander Other Clinician: Referring Provider: Treating Provider/Extender: [name]: 0 Information Obtained from: Patient Chief Complaint Patient is at the clinic for treatment of an open pressure ulcer Electronic Signature(Alexander) Signed: 07/12/2023 2:15:19 PM By: Duanne Guess MD FACS Entered By: Duanne Guess on 07/12/2023 11:15:18 -------------------------------------------------------------------------------- Debridement Details Patient Name: Date of Service: [name], [name]. 07/12/2023 1:00 PM Medical Record Number: [id number] Patient Account Number: [id number] Date of Birth/Sex: Treating RN: 1930/08/28 (89 y.o. [name] Primary Care Provider: Ezequiel Alexander Other Clinician: Referring Provider: Treating Provider/Extender: [name]: 0 Debridement Performed for Assessment: Wound #1 Sacrum Performed By: Physician Duanne Guess, MD Debridement Type: Debridement Level of Consciousness (Pre-procedure): Awake and Alert Pre-procedure Verification/Time Out Yes - 14:00 Taken: Start Time: 14:00 Pain Control: Lidocaine 4% T opical Solution Percent of Wound Bed Debrided: 100% T Area Debrided (cm): otal 7.35 Tissue and other material debrided: Non-Viable, Slough, Slough Level: Non-Viable Tissue Debridement Description: Selective/Open Wound Instrument: Curette Bleeding: Minimum Hemostasis Achieved: Pressure Procedural Pain: 0 Post Procedural Pain: 0 Response to Treatment: Procedure was tolerated well Level of Consciousness (Post-  Awake and Alert procedure): Post Debridement Measurements of Total Wound Length: (cm) 3.9 Stage: Category/Stage III Width: (cm) 2.4 Depth: (cm) 0.1 Volume: (cm) 0.735 Character of Wound/Ulcer Post Debridement: Improved Post Procedure Diagnosis [name] ([id number]) [id number] Page 2 of 9 Same as Pre-procedure Notes scribed for Dr. Lady Gary by Zenaida Deed, RN Electronic Signature(Alexander) Signed: 07/12/2023 2:33:10 PM By: Duanne Guess MD FACS Signed: 07/12/2023 5:11:24 PM By: Zenaida Deed RN, BSN Entered By: Zenaida Deed on 07/12/2023 11:04:43 -------------------------------------------------------------------------------- HPI Details Patient Name: Date of Service: [name], [name]. 07/12/2023 1:00 PM Medical Record Number: [id number] Patient Account Number: [id number] Date of Birth/Sex: Treating RN: January 17, 1930 (89 y.o. M) Primary Care Provider: Ezequiel Alexander Other Clinician: Referring Provider: Treating Provider/Extender: [name]: 0 History of Present Illness HPI Description: ADMISSION 07/12/2023 This is a 89 year old nondiabetic with a history of polymyalgia rheumatica on chronic steroid therapy. He has had a number of recent hospitalizations for various reasons that resulted Alexander debility. After his most recent hospital stay, he was discharged to a skilled nursing facility and returned home about a month ago. One of the issues that multiple specialists have been investigating and has been an elevated sed rate and CRP, along with chronic elevation of his white blood cell count. No obvious infectious etiology appears to have been identified. Due to his limited mobility, he is spent a fair amount of time sitting and subsequently developed a stage III sacral pressure ulcer. He does have a home health nurse/aide and his wife is very active Alexander his care. They have been applying hydrogel and a foam border  dressing to the site. He has an air mattress and his wife has been encouraging him to avoid lying or sitting on the wound site. He was referred to the wound care center by Dr. Daiva Eves Alexander infectious disease for further evaluation and management of the sacral pressure ulcer. Electronic Signature(Alexander) Signed: 07/12/2023 2:19:23 PM By: Duanne Guess MD FACS Entered By: Duanne Guess on 07/12/2023 11:19:23 --------------------------------------------------------------------------------  [name] ([id number]) [id number] Page 1 of 9 Visit Report for 07/12/2023 Chief Complaint Document Details Patient Name: Date of Service: [name], [name]. 07/12/2023 1:00 PM Medical Record Number: [id number] Patient Account Number: [id number] Date of Birth/Sex: Treating RN: 1930/04/14 (89 y.o. M) Primary Care Provider: Ezequiel Alexander Other Clinician: Referring Provider: Treating Provider/Extender: [name]: 0 Information Obtained from: Patient Chief Complaint Patient is at the clinic for treatment of an open pressure ulcer Electronic Signature(Alexander) Signed: 07/12/2023 2:15:19 PM By: Duanne Guess MD FACS Entered By: Duanne Guess on 07/12/2023 11:15:18 -------------------------------------------------------------------------------- Debridement Details Patient Name: Date of Service: [name], [name]. 07/12/2023 1:00 PM Medical Record Number: [id number] Patient Account Number: [id number] Date of Birth/Sex: Treating RN: 1930/08/28 (89 y.o. [name] Primary Care Provider: Ezequiel Alexander Other Clinician: Referring Provider: Treating Provider/Extender: [name]: 0 Debridement Performed for Assessment: Wound #1 Sacrum Performed By: Physician Duanne Guess, MD Debridement Type: Debridement Level of Consciousness (Pre-procedure): Awake and Alert Pre-procedure Verification/Time Out Yes - 14:00 Taken: Start Time: 14:00 Pain Control: Lidocaine 4% T opical Solution Percent of Wound Bed Debrided: 100% T Area Debrided (cm): otal 7.35 Tissue and other material debrided: Non-Viable, Slough, Slough Level: Non-Viable Tissue Debridement Description: Selective/Open Wound Instrument: Curette Bleeding: Minimum Hemostasis Achieved: Pressure Procedural Pain: 0 Post Procedural Pain: 0 Response to Treatment: Procedure was tolerated well Level of Consciousness (Post-  Awake and Alert procedure): Post Debridement Measurements of Total Wound Length: (cm) 3.9 Stage: Category/Stage III Width: (cm) 2.4 Depth: (cm) 0.1 Volume: (cm) 0.735 Character of Wound/Ulcer Post Debridement: Improved Post Procedure Diagnosis [name] ([id number]) [id number] Page 2 of 9 Same as Pre-procedure Notes scribed for Dr. Lady Gary by Zenaida Deed, RN Electronic Signature(Alexander) Signed: 07/12/2023 2:33:10 PM By: Duanne Guess MD FACS Signed: 07/12/2023 5:11:24 PM By: Zenaida Deed RN, BSN Entered By: Zenaida Deed on 07/12/2023 11:04:43 -------------------------------------------------------------------------------- HPI Details Patient Name: Date of Service: [name], [name]. 07/12/2023 1:00 PM Medical Record Number: [id number] Patient Account Number: [id number] Date of Birth/Sex: Treating RN: January 17, 1930 (89 y.o. M) Primary Care Provider: Ezequiel Alexander Other Clinician: Referring Provider: Treating Provider/Extender: [name]: 0 History of Present Illness HPI Description: ADMISSION 07/12/2023 This is a 89 year old nondiabetic with a history of polymyalgia rheumatica on chronic steroid therapy. He has had a number of recent hospitalizations for various reasons that resulted Alexander debility. After his most recent hospital stay, he was discharged to a skilled nursing facility and returned home about a month ago. One of the issues that multiple specialists have been investigating and has been an elevated sed rate and CRP, along with chronic elevation of his white blood cell count. No obvious infectious etiology appears to have been identified. Due to his limited mobility, he is spent a fair amount of time sitting and subsequently developed a stage III sacral pressure ulcer. He does have a home health nurse/aide and his wife is very active Alexander his care. They have been applying hydrogel and a foam border  dressing to the site. He has an air mattress and his wife has been encouraging him to avoid lying or sitting on the wound site. He was referred to the wound care center by Dr. Daiva Eves Alexander infectious disease for further evaluation and management of the sacral pressure ulcer. Electronic Signature(Alexander) Signed: 07/12/2023 2:19:23 PM By: Duanne Guess MD FACS Entered By: Duanne Guess on 07/12/2023 11:19:23 --------------------------------------------------------------------------------  [name] ([id number]) [id number] Page 1 of 9 Visit Report for 07/12/2023 Chief Complaint Document Details Patient Name: Date of Service: [name], [name]. 07/12/2023 1:00 PM Medical Record Number: [id number] Patient Account Number: [id number] Date of Birth/Sex: Treating RN: 1930/04/14 (89 y.o. M) Primary Care Provider: Ezequiel Alexander Other Clinician: Referring Provider: Treating Provider/Extender: [name]: 0 Information Obtained from: Patient Chief Complaint Patient is at the clinic for treatment of an open pressure ulcer Electronic Signature(Alexander) Signed: 07/12/2023 2:15:19 PM By: Duanne Guess MD FACS Entered By: Duanne Guess on 07/12/2023 11:15:18 -------------------------------------------------------------------------------- Debridement Details Patient Name: Date of Service: [name], [name]. 07/12/2023 1:00 PM Medical Record Number: [id number] Patient Account Number: [id number] Date of Birth/Sex: Treating RN: 1930/08/28 (89 y.o. [name] Primary Care Provider: Ezequiel Alexander Other Clinician: Referring Provider: Treating Provider/Extender: [name]: 0 Debridement Performed for Assessment: Wound #1 Sacrum Performed By: Physician Duanne Guess, MD Debridement Type: Debridement Level of Consciousness (Pre-procedure): Awake and Alert Pre-procedure Verification/Time Out Yes - 14:00 Taken: Start Time: 14:00 Pain Control: Lidocaine 4% T opical Solution Percent of Wound Bed Debrided: 100% T Area Debrided (cm): otal 7.35 Tissue and other material debrided: Non-Viable, Slough, Slough Level: Non-Viable Tissue Debridement Description: Selective/Open Wound Instrument: Curette Bleeding: Minimum Hemostasis Achieved: Pressure Procedural Pain: 0 Post Procedural Pain: 0 Response to Treatment: Procedure was tolerated well Level of Consciousness (Post-  Awake and Alert procedure): Post Debridement Measurements of Total Wound Length: (cm) 3.9 Stage: Category/Stage III Width: (cm) 2.4 Depth: (cm) 0.1 Volume: (cm) 0.735 Character of Wound/Ulcer Post Debridement: Improved Post Procedure Diagnosis [name] ([id number]) [id number] Page 2 of 9 Same as Pre-procedure Notes scribed for Dr. Lady Gary by Zenaida Deed, RN Electronic Signature(Alexander) Signed: 07/12/2023 2:33:10 PM By: Duanne Guess MD FACS Signed: 07/12/2023 5:11:24 PM By: Zenaida Deed RN, BSN Entered By: Zenaida Deed on 07/12/2023 11:04:43 -------------------------------------------------------------------------------- HPI Details Patient Name: Date of Service: [name], [name]. 07/12/2023 1:00 PM Medical Record Number: [id number] Patient Account Number: [id number] Date of Birth/Sex: Treating RN: January 17, 1930 (89 y.o. M) Primary Care Provider: Ezequiel Alexander Other Clinician: Referring Provider: Treating Provider/Extender: [name]: 0 History of Present Illness HPI Description: ADMISSION 07/12/2023 This is a 89 year old nondiabetic with a history of polymyalgia rheumatica on chronic steroid therapy. He has had a number of recent hospitalizations for various reasons that resulted Alexander debility. After his most recent hospital stay, he was discharged to a skilled nursing facility and returned home about a month ago. One of the issues that multiple specialists have been investigating and has been an elevated sed rate and CRP, along with chronic elevation of his white blood cell count. No obvious infectious etiology appears to have been identified. Due to his limited mobility, he is spent a fair amount of time sitting and subsequently developed a stage III sacral pressure ulcer. He does have a home health nurse/aide and his wife is very active Alexander his care. They have been applying hydrogel and a foam border  dressing to the site. He has an air mattress and his wife has been encouraging him to avoid lying or sitting on the wound site. He was referred to the wound care center by Dr. Daiva Eves Alexander infectious disease for further evaluation and management of the sacral pressure ulcer. Electronic Signature(Alexander) Signed: 07/12/2023 2:19:23 PM By: Duanne Guess MD FACS Entered By: Duanne Guess on 07/12/2023 11:19:23 --------------------------------------------------------------------------------  who has been debilitated by a number of issues over the past year and due to immobility, has developed a pressure ulcer on his sacral area. There is an area on his sacrum with some purpleish discoloration suggestive of pressure related tissue injury, but the open  portion of the [facility], [name] ([id number]) [id number] Page 7 of 9 wound is quite small. There is evidence of perimeter epithelialization and overall, the wound is quite clean with just a little slough on the surface. I used a curette to debride the slough from his wound. We will apply silver alginate and a foam border dressing. He and his wife and caregiver were all reminded of the importance of offloading the site, as well as adequate protein intake. I recommended 70 to 100 g of protein daily. They will follow-up Alexander 2 weeks. Electronic Signature(Alexander) Signed: 07/12/2023 2:22:28 PM By: Duanne Guess MD FACS Entered By: Duanne Guess on 07/12/2023 11:22:27 -------------------------------------------------------------------------------- HxROS Details Patient Name: Date of Service: [name], [name]. 07/12/2023 1:00 PM Medical Record Number: [id number] Patient Account Number: [id number] Date of Birth/Sex: Treating RN: [birth year] (89 y.o. [name] Primary Care Provider: Ezequiel Alexander Other Clinician: Referring Provider: Treating Provider/Extender: [name]: 0 Information Obtained From Patient Chart Constitutional Symptoms (General Health) Complaints and Symptoms: Positive for: Fatigue Negative for: Fever; Chills; Marked Weight Change Eyes Complaints and Symptoms: Negative for: Dry Eyes; Vision Changes; Glasses / Contacts Medical History: Positive for: Cataracts - bil extractions Negative for: Glaucoma; Optic Neuritis Past Medical History Notes: retinal vein occlusion Ear/Nose/Mouth/Throat Complaints and Symptoms: Negative for: Chronic sinus problems or rhinitis Medical History: Negative for: Chronic sinus problems/congestion; Middle ear problems Past Medical History Notes: seasonal allergies Respiratory Complaints and Symptoms: Positive for: Shortness of Breath Negative for: Chronic or frequent  coughs Medical History: Negative for: Aspiration; Asthma; Chronic Obstructive Pulmonary Disease (COPD); Pneumothorax; Sleep Apnea; Tuberculosis Past Medical History Notes: interstitial lung disease, O2 dependent, hx PE Gastrointestinal Complaints and Symptoms: Negative for: Frequent diarrhea; Nausea; Vomiting Endocrine Complaints and Symptoms: Negative for: Heat/cold intolerance [name], [name] ([id number]) [id number] Page 8 of 9 Medical History: Negative for: Type I Diabetes; Type II Diabetes Genitourinary Complaints and Symptoms: Negative for: Frequent urination Medical History: Negative for: End Stage Renal Disease Past Medical History Notes: BPH, foley cath Integumentary (Skin) Complaints and Symptoms: Positive for: Wounds - sacrum Medical History: Negative for: History of Burn Musculoskeletal Complaints and Symptoms: Positive for: Muscle Weakness Negative for: Muscle Pain Medical History: Positive for: Osteoarthritis Negative for: Gout; Rheumatoid Arthritis; Osteomyelitis Past Medical History Notes: polymyalgia rheumatica, degenerative disk dx, dupuytren'Alexander contracture Neurologic Complaints and Symptoms: Negative for: Numbness/parasthesias Psychiatric Complaints and Symptoms: Negative for: Claustrophobia Medical History: Negative for: Anorexia/bulimia; Confinement Anxiety Hematologic/Lymphatic Medical History: Positive for: Anemia Negative for: Hemophilia; Human Immunodeficiency Virus; Lymphedema; Sickle Cell Disease Cardiovascular Medical History: Positive for: Congestive Heart Failure; Deep Vein Thrombosis Negative for: Arrhythmia Past Medical History Notes: AAA, hyperlipidemia, aortitis, hx PNA, eosinophic pneumonia Immunological Oncologic Medical History: Negative for: Received Chemotherapy; Received Radiation HBO Extended History Items Eyes: Cataracts Immunizations Pneumococcal Vaccine: Received Pneumococcal  Vaccination: Yes Received Pneumococcal Vaccination On or After 60th Birthday: Yes Implantable Devices None [name], [name] ([id number]) [id number] Page 9 of 9 Family and Social History Cancer: Yes - Siblings; Diabetes: No; Heart Disease: Yes - Mother; Hereditary Spherocytosis: No; Hypertension: No; Kidney Disease: No; Lung Disease: No; Seizures: No; Stroke: No; Thyroid Problems: No; Tuberculosis: No; Never smoker; Marital Status - Married; Alcohol Use:  who has been debilitated by a number of issues over the past year and due to immobility, has developed a pressure ulcer on his sacral area. There is an area on his sacrum with some purpleish discoloration suggestive of pressure related tissue injury, but the open  portion of the [facility], [name] ([id number]) [id number] Page 7 of 9 wound is quite small. There is evidence of perimeter epithelialization and overall, the wound is quite clean with just a little slough on the surface. I used a curette to debride the slough from his wound. We will apply silver alginate and a foam border dressing. He and his wife and caregiver were all reminded of the importance of offloading the site, as well as adequate protein intake. I recommended 70 to 100 g of protein daily. They will follow-up Alexander 2 weeks. Electronic Signature(Alexander) Signed: 07/12/2023 2:22:28 PM By: Duanne Guess MD FACS Entered By: Duanne Guess on 07/12/2023 11:22:27 -------------------------------------------------------------------------------- HxROS Details Patient Name: Date of Service: [name], [name]. 07/12/2023 1:00 PM Medical Record Number: [id number] Patient Account Number: [id number] Date of Birth/Sex: Treating RN: [birth year] (89 y.o. [name] Primary Care Provider: Ezequiel Alexander Other Clinician: Referring Provider: Treating Provider/Extender: [name]: 0 Information Obtained From Patient Chart Constitutional Symptoms (General Health) Complaints and Symptoms: Positive for: Fatigue Negative for: Fever; Chills; Marked Weight Change Eyes Complaints and Symptoms: Negative for: Dry Eyes; Vision Changes; Glasses / Contacts Medical History: Positive for: Cataracts - bil extractions Negative for: Glaucoma; Optic Neuritis Past Medical History Notes: retinal vein occlusion Ear/Nose/Mouth/Throat Complaints and Symptoms: Negative for: Chronic sinus problems or rhinitis Medical History: Negative for: Chronic sinus problems/congestion; Middle ear problems Past Medical History Notes: seasonal allergies Respiratory Complaints and Symptoms: Positive for: Shortness of Breath Negative for: Chronic or frequent  coughs Medical History: Negative for: Aspiration; Asthma; Chronic Obstructive Pulmonary Disease (COPD); Pneumothorax; Sleep Apnea; Tuberculosis Past Medical History Notes: interstitial lung disease, O2 dependent, hx PE Gastrointestinal Complaints and Symptoms: Negative for: Frequent diarrhea; Nausea; Vomiting Endocrine Complaints and Symptoms: Negative for: Heat/cold intolerance [name], [name] ([id number]) [id number] Page 8 of 9 Medical History: Negative for: Type I Diabetes; Type II Diabetes Genitourinary Complaints and Symptoms: Negative for: Frequent urination Medical History: Negative for: End Stage Renal Disease Past Medical History Notes: BPH, foley cath Integumentary (Skin) Complaints and Symptoms: Positive for: Wounds - sacrum Medical History: Negative for: History of Burn Musculoskeletal Complaints and Symptoms: Positive for: Muscle Weakness Negative for: Muscle Pain Medical History: Positive for: Osteoarthritis Negative for: Gout; Rheumatoid Arthritis; Osteomyelitis Past Medical History Notes: polymyalgia rheumatica, degenerative disk dx, dupuytren'Alexander contracture Neurologic Complaints and Symptoms: Negative for: Numbness/parasthesias Psychiatric Complaints and Symptoms: Negative for: Claustrophobia Medical History: Negative for: Anorexia/bulimia; Confinement Anxiety Hematologic/Lymphatic Medical History: Positive for: Anemia Negative for: Hemophilia; Human Immunodeficiency Virus; Lymphedema; Sickle Cell Disease Cardiovascular Medical History: Positive for: Congestive Heart Failure; Deep Vein Thrombosis Negative for: Arrhythmia Past Medical History Notes: AAA, hyperlipidemia, aortitis, hx PNA, eosinophic pneumonia Immunological Oncologic Medical History: Negative for: Received Chemotherapy; Received Radiation HBO Extended History Items Eyes: Cataracts Immunizations Pneumococcal Vaccine: Received Pneumococcal  Vaccination: Yes Received Pneumococcal Vaccination On or After 60th Birthday: Yes Implantable Devices None [name], [name] ([id number]) [id number] Page 9 of 9 Family and Social History Cancer: Yes - Siblings; Diabetes: No; Heart Disease: Yes - Mother; Hereditary Spherocytosis: No; Hypertension: No; Kidney Disease: No; Lung Disease: No; Seizures: No; Stroke: No; Thyroid Problems: No; Tuberculosis: No; Never smoker; Marital Status - Married; Alcohol Use:  Rarely; Drug Use: No History; Caffeine Use: Rarely; Financial Concerns: No; Food, Clothing or Shelter Needs: No; Support System Lacking: No; Transportation Concerns: No Psychologist, prison and probation services) Signed: 07/12/2023 2:33:10 PM By: Duanne Guess MD FACS Signed: 07/12/2023 5:11:24 PM By: Zenaida Deed RN, BSN Entered By: Zenaida Deed on 07/12/2023 10:44:29 -------------------------------------------------------------------------------- SuperBill Details Patient Name: Date of Service: [name], [name]. 07/12/2023 Medical Record Number: [id number] Patient Account Number: [id number] Date of Birth/Sex: Treating RN: 1929/12/19 (89 y.o. M) Primary Care Provider: Ezequiel Alexander Other Clinician: Referring Provider: Treating Provider/Extender: [name]: 0 Diagnosis Coding ICD-10 Codes Code Description [phone] Pressure ulcer of sacral region, stage 3 Z79.52 Long term (current) use of systemic steroids M35.3 Polymyalgia rheumatica Facility Procedures : CPT4 Code: [id number] Description: 99213 - WOUND CARE VISIT-LEV 3 EST PT Modifier: 25 Quantity: 1 : CPT4 Code: [id number] Description: 97597 - DEBRIDE WOUND 1ST 20 SQ CM OR < ICD-10 Diagnosis Description L89.153 Pressure ulcer of sacral region, stage 3 Modifier: Quantity: 1 Physician Procedures : CPT4 Code Description Modifier [id number] 99204 - WC PHYS LEVEL 4 - NEW PT 25 ICD-10 Diagnosis Description L89.153 Pressure ulcer of  sacral region, stage 3 Z79.52 Long term (current) use of systemic steroids M35.3 Polymyalgia rheumatica Quantity: 1 : [id number] 97597 - WC PHYS DEBR WO ANESTH 20 SQ CM ICD-10 Diagnosis Description L89.153 Pressure ulcer of sacral region, stage 3 Quantity: 1 Electronic Signature(Alexander) Signed: 07/20/2023 3:06:47 PM By: Duanne Guess MD FACS Signed: 08/14/2023 4:46:56 PM By: Zenaida Deed RN, BSN Previous Signature: 07/12/2023 2:22:51 PM Version By: Duanne Guess MD FACS Entered By: Zenaida Deed on 07/20/2023 08:43:59

## 2023-07-13 NOTE — Telephone Encounter (Signed)
Patient is aware of upcoming appointment times/dates.  

## 2023-07-13 NOTE — Progress Notes (Signed)
Cudworth, Asante Alexander ([id number]) [id number] Page 1 of 4 Visit Report for 07/12/2023 Abuse Risk Screen Details Patient Name: Date of Service: [name], [name]. 07/12/2023 1:00 PM Medical Record Number: [id number] Patient Account Number: [id number] Date of Birth/Sex: Treating RN: [birth year] (89 y.o. Damaris Schooner Primary Care : Ezequiel Kayser Other Clinician: Referring : Treating /Extender: Dorthea Cove in Treatment: 0 Abuse Risk Screen Items Answer ABUSE RISK SCREEN: Has anyone close to you tried to hurt or harm you recentlyo No Do you feel uncomfortable with anyone in your familyo No Has anyone forced you do things that you didnt want to doo No Electronic Signature(Alexander) Signed: 07/12/2023 5:11:24 PM By: Zenaida Deed RN, BSN Entered By: Zenaida Deed on 07/12/2023 13:44:38 -------------------------------------------------------------------------------- Activities of Daily Living Details Patient Name: Date of Service: [name], [name]. 07/12/2023 1:00 PM Medical Record Number: [id number] Patient Account Number: [id number] Date of Birth/Sex: Treating RN: [birth year] (89 y.o. Damaris Schooner Primary Care : Ezequiel Kayser Other Clinician: Referring : Treating /Extender: Dorthea Cove in Treatment: 0 Activities of Daily Living Items Answer Activities of Daily Living (Please select one for each item) Drive Automobile Not Able T Medications ake Need Assistance Use T elephone Completely Able Care for Appearance Need Assistance Use T oilet Need Assistance Bath / Shower Need Assistance Dress Self Need Assistance Feed Self Completely Able Walk Need Assistance Get In / Out Bed Need Assistance Housework Need Assistance Prepare Meals Need Assistance Handle Money Completely Able Shop for Self Need Assistance Electronic Signature(Alexander) Signed: 07/12/2023  5:11:24 PM By: Zenaida Deed RN, BSN Entered By: Zenaida Deed on 07/12/2023 13:45:34 Lukasiewicz, Donaciano Eva ([id number]) [id number] Page 2 of 4 -------------------------------------------------------------------------------- Education Screening Details Patient Name: Date of Service: [name], [name]. 07/12/2023 1:00 PM Medical Record Number: [id number] Patient Account Number: [id number] Date of Birth/Sex: Treating RN: [birth year] (89 y.o. Damaris Schooner Primary Care : Ezequiel Kayser Other Clinician: Referring : Treating /Extender: Dorthea Cove in Treatment: 0 Primary Learner Assessed: Patient Learning Preferences/Education Level/Primary Language Learning Preference: Explanation, Demonstration, Printed Material Highest Education Level: College or Above Preferred Language: English Cognitive Barrier Language Barrier: No Translator Needed: No Memory Deficit: No Emotional Barrier: No Cultural/Religious Beliefs Affecting Medical Care: No Physical Barrier Impaired Vision: No Impaired Hearing: No Decreased Hand dexterity: No Knowledge/Comprehension Knowledge Level: High Comprehension Level: High Ability to understand written instructions: High Ability to understand verbal instructions: High Motivation Anxiety Level: Calm Cooperation: Cooperative Education Importance: Acknowledges Need Interest in Health Problems: Asks Questions Perception: Coherent Willingness to Engage in Self-Management High Activities: Readiness to Engage in Self-Management High Activities: Electronic Signature(Alexander) Signed: 07/12/2023 5:11:24 PM By: Zenaida Deed RN, BSN Entered By: Zenaida Deed on 07/12/2023 13:46:24 -------------------------------------------------------------------------------- Fall Risk Assessment Details Patient Name: Date of Service: [name], [name]. 07/12/2023 1:00 PM Medical Record Number:  [id number] Patient Account Number: [id number] Date of Birth/Sex: Treating RN: [birth year] (89 y.o. Damaris Schooner Primary Care : Ezequiel Kayser Other Clinician: Referring : Treating /Extender: Dorthea Cove in Treatment: 0 Fall Risk Assessment Items Have you had 2 or more falls in the last 12 monthso 0 No Woodell, Cortavious Alexander ([id number]) [id number] Page 3 of 4 Have you had any fall that resulted in injury in the last 12 monthso 0 No FALLS RISK SCREEN History of falling - immediate or within 3 months 25 Yes Secondary diagnosis (Do you have 2  or more medical diagnoseso) 0 No Ambulatory aid None/bed rest/wheelchair/nurse 0 No Crutches/cane/walker 15 Yes Furniture 0 No Intravenous therapy Access/Saline/Heparin Lock 0 No Gait/Transferring Normal/ bed rest/ wheelchair 0 No Weak (short steps with or without shuffle, stooped but able to lift head while walking, may seek 10 Yes support from furniture) Impaired (short steps with shuffle, may have difficulty arising from chair, head down, impaired 0 No balance) Mental Status Oriented to own ability 0 Yes Electronic Signature(Alexander) Signed: 07/12/2023 5:11:24 PM By: Zenaida Deed RN, BSN Entered By: Zenaida Deed on 07/12/2023 13:46:45 -------------------------------------------------------------------------------- Foot Assessment Details Patient Name: Date of Service: [name], [name]. 07/12/2023 1:00 PM Medical Record Number: [id number] Patient Account Number: [id number] Date of Birth/Sex: Treating RN: June 18, 1930 (89 y.o. Damaris Schooner Primary Care : Ezequiel Kayser Other Clinician: Referring : Treating /Extender: Dorthea Cove in Treatment: 0 Foot Assessment Items Site Locations + = Sensation present, - = Sensation absent, C = Callus, U = Ulcer R = Redness, W = Warmth, M = Maceration, PU =  Pre-ulcerative lesion F = Fissure, Alexander = Swelling, D = Dryness Assessment Right: Left: Other Deformity: No No Prior Foot Ulcer: No No Prior Amputation: No No Charcot Joint: No No Ambulatory Status: Ambulatory With Help Assistance Device: BRONN, ALTIDOR ([id number]) [phone] [id number] Page 4 of 4 Gait: Surveyor, mining) Signed: 07/12/2023 5:11:24 PM By: Zenaida Deed RN, BSN Entered By: Zenaida Deed on 07/12/2023 13:48:09 -------------------------------------------------------------------------------- Nutrition Risk Screening Details Patient Name: Date of Service: [name], Ryland Alexander. 07/12/2023 1:00 PM Medical Record Number: [id number] Patient Account Number: [id number] Date of Birth/Sex: Treating RN: 1930/05/03 (89 y.o. Damaris Schooner Primary Care : Ezequiel Kayser Other Clinician: Referring : Treating /Extender: Dorthea Cove in Treatment: 0 Height (in): 72 Weight (lbs): 144 Body Mass Index (BMI): 19.5 Nutrition Risk Screening Items Score Screening NUTRITION RISK SCREEN: I have an illness or condition that made me change the kind and/or amount of food I eat 0 No I eat fewer than two meals per day 0 No I eat few fruits and vegetables, or milk products 0 No I have three or more drinks of beer, liquor or wine almost every day 0 No I have tooth or mouth problems that make it hard for me to eat 0 No I don't always have enough money to buy the food I need 0 No I eat alone most of the time 0 No I take three or more different prescribed or over-the-counter drugs a day 1 Yes Without wanting to, I have lost or gained 10 pounds in the last six months 2 Yes I am not always physically able to shop, cook and/or feed myself 0 No Nutrition Protocols Good Risk Protocol Moderate Risk Protocol 0 Provide education on nutrition High Risk Proctocol Risk Level: Moderate Risk Score:  3 Electronic Signature(Alexander) Signed: 07/12/2023 5:11:24 PM By: Zenaida Deed RN, BSN Entered By: Zenaida Deed on 07/12/2023 13:47:17

## 2023-07-13 NOTE — Telephone Encounter (Signed)
I follow up with patient and wife to review again the labs form 07/11/2023. Patient would like to proceed with IV iron. We will schedule IV monoferric x 1 dose next week. In addition, I will request a follow up with his gastroenterologist, Dr. Marina Goodell, as patient had positive stool guac tests. They would still like to think about proceeding with a bone marrow biopsy. I will schedule labs and follow up visit with Dr. Leonides Schanz in 6 weeks to re-discuss.

## 2023-07-13 NOTE — Progress Notes (Addendum)
Alexander, Randall S ([id number]) [id number] Page 1 of 8 Visit Report for 07/12/2023 Allergy List Details Patient Name: Date of Service: [name], [name] 07/12/2023 1:00 PM Medical Record Number: [id number] Patient Account Number: [id number] Date of Birth/Sex: Treating RN: Jan 25, 1930 (89 y.o. [name] Primary Care [name]: [name] Other Clinician: Referring [name]: Treating [name]/Extender: [name]: 0 Allergies Active Allergies daptomycin Reaction: shock rosuvastatin codeine Reaction: nausea/vomiting morphine Reaction: nausea/vomiting Omnicef Allergy Notes Electronic Signature(s) Signed: 07/12/2023 5:11:24 PM By: Zenaida Deed RN, BSN Entered By: Zenaida Deed on 07/12/2023 09:44:35 -------------------------------------------------------------------------------- Arrival Information Details Patient Name: Date of Service: [name], [name] 07/12/2023 1:00 PM Medical Record Number: [id number] Patient Account Number: [id number] Date of Birth/Sex: Treating RN: [birth year] (89 y.o. [name] Primary Care [name]: [name] Other Clinician: Referring [name]: Treating [name]/Extender: [name]: 0 Visit Information Patient Arrived: Wheel Chair Arrival Time: 13:20 Accompanied By: spouse, caregiver Transfer Assistance: Manual Patient Identification Verified: Yes Secondary Verification Process Completed: Yes Patient Requires Transmission-Based Precautions: No Patient Has Alerts: No Electronic Signature(s) Signed: 07/12/2023 5:11:24 PM By: Zenaida Deed RN, BSN Entered By: Zenaida Deed on 07/12/2023 10:26:23 Alexander, Randall S ([id number]) [id number] Page 2 of 8 -------------------------------------------------------------------------------- Clinic Level of Care Assessment Details Patient Name: Date of  Service: [name], [name] 07/12/2023 1:00 PM Medical Record Number: [id number] Patient Account Number: [id number] Date of Birth/Sex: Treating RN: [birth year] (89 y.o. [name] Primary Care Dynastee Brummell: [name] Other Clinician: Referring Nakeya Adinolfi: Treating [name]/Extender: [name]: 0 Clinic Level of Care Assessment Items TOOL 1 Quantity Score []  - 0 Use when EandM and Procedure is performed on INITIAL visit ASSESSMENTS - Nursing Assessment / Reassessment X- 1 20 General Physical Exam (combine w/ comprehensive assessment (listed just below) when performed on new pt. evals) X- 1 25 Comprehensive Assessment (HX, ROS, Risk Assessments, Wounds Hx, etc.) ASSESSMENTS - Wound and Skin Assessment / Reassessment []  - 0 Dermatologic / Skin Assessment (not related to wound area) ASSESSMENTS - Ostomy and/or Continence Assessment and Care []  - 0 Incontinence Assessment and Management []  - 0 Ostomy Care Assessment and Management (repouching, etc.) PROCESS - Coordination of Care X - Simple Patient / Family Education for ongoing care 1 15 []  - 0 Complex (extensive) Patient / Family Education for ongoing care X- 1 10 Staff obtains Chiropractor, Records, T Results / Process Orders est X- 1 10 Staff telephones HHA, Nursing Homes / Clarify orders / etc []  - 0 Routine Transfer to another Facility (non-emergent condition) []  - 0 Routine Hospital Admission (non-emergent condition) X- 1 15 New Admissions / Manufacturing engineer / Ordering NPWT Apligraf, etc. , []  - 0 Emergency Hospital Admission (emergent condition) PROCESS - Special Needs []  - 0 Pediatric / Minor Patient Management []  - 0 Isolation Patient Management []  - 0 Hearing / Language / Visual special needs []  - 0 Assessment of Community assistance (transportation, D/C planning, etc.) []  - 0 Additional assistance / Altered mentation []  - 0 Support Surface(s) Assessment  (bed, cushion, seat, etc.) INTERVENTIONS - Miscellaneous []  - 0 External ear exam []  - 0 Patient Transfer (multiple staff / Nurse, adult / Similar devices) []  - 0 Simple Staple / Suture removal (25 or less) []  - 0 Complex Staple / Suture removal (26 or more) []  - 0 Hypo/Hyperglycemic Management (do not check if billed separately) []  - 0 Ankle / Brachial Index (ABI) -  Alexander, Randall S ([id number]) [id number] Page 1 of 8 Visit Report for 07/12/2023 Allergy List Details Patient Name: Date of Service: [name], [name] 07/12/2023 1:00 PM Medical Record Number: [id number] Patient Account Number: [id number] Date of Birth/Sex: Treating RN: Jan 25, 1930 (89 y.o. [name] Primary Care [name]: [name] Other Clinician: Referring [name]: Treating [name]/Extender: [name]: 0 Allergies Active Allergies daptomycin Reaction: shock rosuvastatin codeine Reaction: nausea/vomiting morphine Reaction: nausea/vomiting Omnicef Allergy Notes Electronic Signature(s) Signed: 07/12/2023 5:11:24 PM By: Zenaida Deed RN, BSN Entered By: Zenaida Deed on 07/12/2023 09:44:35 -------------------------------------------------------------------------------- Arrival Information Details Patient Name: Date of Service: [name], [name] 07/12/2023 1:00 PM Medical Record Number: [id number] Patient Account Number: [id number] Date of Birth/Sex: Treating RN: [birth year] (89 y.o. [name] Primary Care [name]: [name] Other Clinician: Referring [name]: Treating [name]/Extender: [name]: 0 Visit Information Patient Arrived: Wheel Chair Arrival Time: 13:20 Accompanied By: spouse, caregiver Transfer Assistance: Manual Patient Identification Verified: Yes Secondary Verification Process Completed: Yes Patient Requires Transmission-Based Precautions: No Patient Has Alerts: No Electronic Signature(s) Signed: 07/12/2023 5:11:24 PM By: Zenaida Deed RN, BSN Entered By: Zenaida Deed on 07/12/2023 10:26:23 Alexander, Randall S ([id number]) [id number] Page 2 of 8 -------------------------------------------------------------------------------- Clinic Level of Care Assessment Details Patient Name: Date of  Service: [name], [name] 07/12/2023 1:00 PM Medical Record Number: [id number] Patient Account Number: [id number] Date of Birth/Sex: Treating RN: [birth year] (89 y.o. [name] Primary Care Dynastee Brummell: [name] Other Clinician: Referring Nakeya Adinolfi: Treating [name]/Extender: [name]: 0 Clinic Level of Care Assessment Items TOOL 1 Quantity Score []  - 0 Use when EandM and Procedure is performed on INITIAL visit ASSESSMENTS - Nursing Assessment / Reassessment X- 1 20 General Physical Exam (combine w/ comprehensive assessment (listed just below) when performed on new pt. evals) X- 1 25 Comprehensive Assessment (HX, ROS, Risk Assessments, Wounds Hx, etc.) ASSESSMENTS - Wound and Skin Assessment / Reassessment []  - 0 Dermatologic / Skin Assessment (not related to wound area) ASSESSMENTS - Ostomy and/or Continence Assessment and Care []  - 0 Incontinence Assessment and Management []  - 0 Ostomy Care Assessment and Management (repouching, etc.) PROCESS - Coordination of Care X - Simple Patient / Family Education for ongoing care 1 15 []  - 0 Complex (extensive) Patient / Family Education for ongoing care X- 1 10 Staff obtains Chiropractor, Records, T Results / Process Orders est X- 1 10 Staff telephones HHA, Nursing Homes / Clarify orders / etc []  - 0 Routine Transfer to another Facility (non-emergent condition) []  - 0 Routine Hospital Admission (non-emergent condition) X- 1 15 New Admissions / Manufacturing engineer / Ordering NPWT Apligraf, etc. , []  - 0 Emergency Hospital Admission (emergent condition) PROCESS - Special Needs []  - 0 Pediatric / Minor Patient Management []  - 0 Isolation Patient Management []  - 0 Hearing / Language / Visual special needs []  - 0 Assessment of Community assistance (transportation, D/C planning, etc.) []  - 0 Additional assistance / Altered mentation []  - 0 Support Surface(s) Assessment  (bed, cushion, seat, etc.) INTERVENTIONS - Miscellaneous []  - 0 External ear exam []  - 0 Patient Transfer (multiple staff / Nurse, adult / Similar devices) []  - 0 Simple Staple / Suture removal (25 or less) []  - 0 Complex Staple / Suture removal (26 or more) []  - 0 Hypo/Hyperglycemic Management (do not check if billed separately) []  - 0 Ankle / Brachial Index (ABI) -  89 y.o. [name] Primary  Care Physician: [name] Other Clinician: Referring Physician: Treating Physician/Extender: [name]: 0 Education Assessment Education Provided To: Patient Education Topics Provided Pressure: Methods: Explain/Verbal Responses: Reinforcements needed, State content correctly Wound/Skin Impairment: Methods: Explain/Verbal Responses: Reinforcements needed, State content correctly Electronic Signature(s) Signed: 08/14/2023 4:46:56 PM By: Zenaida Deed RN, BSN Entered By: Zenaida Deed on 07/20/2023 08:41:46 -------------------------------------------------------------------------------- Wound Assessment Details Patient Name: Date of Service: [name], [name] 07/12/2023 1:00 PM Medical Record Number: [id number] Patient Account Number: [id number] Date of Birth/Sex: Treating RN: 1930/07/05 (89 y.o. M) Primary Care Iola Turri: [name] Other Clinician: Referring Anthonio Mizzell: Treating Taquanna Borras/Extender: [name]: 0 Wound Status Wound Number: 1 Primary Pressure Ulcer Etiology: Wound Location: Sacrum Wound Open Wounding Event: Not Known Status: Date Acquired: 06/07/2023 Comorbid Cataracts, Anemia, Congestive Heart Failure, Deep Vein Weeks Of Treatment: 0 History: Thrombosis, Osteoarthritis Clustered Wound: No Photos Shifflet, Naomi S ([id number]) [id number] Page 8 of 8 Wound Measurements Length: (cm) 3.9 Width: (cm) 2.4 Depth: (cm) 0.1 Area: (cm) 7.351 Volume: (cm) 0.735 % Reduction Alexander Area: % Reduction Alexander Volume: Epithelialization: Medium (34-66%) Tunneling: No Undermining: No Wound Description Classification: Category/Stage III Wound Margin: Thickened Exudate Amount: Medium Exudate Type: Serous Exudate Color: amber Foul Odor After Cleansing: No Slough/Fibrino Yes Wound Bed Granulation Amount: Small (1-33%) Exposed Structure Granulation Quality:  Pink Fascia Exposed: No Necrotic Amount: Large (67-100%) Fat Layer (Subcutaneous Tissue) Exposed: Yes Necrotic Quality: Adherent Slough Tendon Exposed: No Muscle Exposed: No Joint Exposed: No Bone Exposed: No Periwound Skin Texture Texture Color No Abnormalities Noted: Yes No Abnormalities Noted: Yes Moisture Temperature / Pain No Abnormalities Noted: Yes Temperature: No Abnormality Electronic Signature(s) Signed: 07/12/2023 5:11:24 PM By: Zenaida Deed RN, BSN Entered By: Zenaida Deed on 07/12/2023 10:51:22 -------------------------------------------------------------------------------- Vitals Details Patient Name: Date of Service: [name], [name] 07/12/2023 1:00 PM Medical Record Number: [id number] Patient Account Number: [id number] Date of Birth/Sex: Treating RN: 1930-06-23 (89 y.o. [name] Primary Care Ferris Fielden: [name] Other Clinician: Referring Lark Langenfeld: Treating Drury Ardizzone/Extender: [name]: 0 Vital Signs Time Taken: 13:26 Temperature (F): 97.8 Height (Alexander): 72 Pulse (bpm): 98 Source: Stated Respiratory Rate (breaths/min): 18 Weight (lbs): 144 Blood Pressure (mmHg): 111/63 Source: Stated Reference Range: 80 - 120 mg / dl Body Mass Index (BMI): 19.5 Electronic Signature(s) Signed: 07/12/2023 5:11:24 PM By: Zenaida Deed RN, BSN Entered By: Zenaida Deed on 07/12/2023 10:27:10  do not check if billed separately Has the patient been seen at the hospital within the last three years: Yes Total Score: 95 Level Of Care: New/Established - Level 3 Laser, Glenda S ([id number]) [id number] Page 3 of 8 Electronic Signature(s) Signed: 08/14/2023 4:46:56 PM By: Zenaida Deed RN, BSN Entered By: Zenaida Deed on 07/20/2023 08:43:46 -------------------------------------------------------------------------------- Encounter Discharge Information Details Patient Name: Date of Service: [name], [name] 07/12/2023 1:00 PM Medical Record Number: [id number] Patient Account Number: [id number] Date of Birth/Sex: Treating RN: 1930/04/23 (89 y.o. [name] Primary Care Gigi Onstad: [name] Other Clinician: Referring Mazy Culton: Treating Maddilyn Campus/Extender: [name]: 0 Encounter Discharge Information Items Post Procedure Vitals Discharge Condition: Stable Temperature (F): 97.8 Ambulatory Status: Wheelchair Pulse (bpm): 98 Discharge Destination: Home Respiratory Rate (breaths/min): 18 Transportation: Private Auto Blood Pressure (mmHg): 111/63 Accompanied By: family Schedule Follow-up Appointment: Yes Clinical Summary of Care: Patient Declined Electronic Signature(s) Signed: 08/14/2023 4:46:56 PM By: Zenaida Deed RN, BSN Entered By: Zenaida Deed on 07/20/2023 08:50:06 -------------------------------------------------------------------------------- Lower Extremity Assessment Details Patient Name: Date of Service: [name],  [name] 07/12/2023 1:00 PM Medical Record Number: [id number] Patient Account Number: [id number] Date of Birth/Sex: Treating RN: 1930/04/03 (89 y.o. [name] Primary Care Deania Siguenza: [name] Other Clinician: Referring Jaden Abreu: Treating Johnaton Sonneborn/Extender: [name]: 0 Electronic Signature(s) Signed: 07/12/2023 5:11:24 PM By: Zenaida Deed RN, BSN Entered By: Zenaida Deed on 07/12/2023 10:48:16 -------------------------------------------------------------------------------- Multi Wound Chart Details Patient Name: Date of Service: [name], [name] 07/12/2023 1:00 PM Medical Record Number: [id number] Patient Account Number: [id number] Date of Birth/Sex: Treating RN: [birth year] (89 y.o. M) Primary Care Nykolas Bacallao: [name] Other Clinician: Referring Kerryann Allaire: Treating Darionna Banke/Extender: [name]: 0 Vital Signs Demarcus, Donaciano Eva ([id number]) [id number] Page 4 of 8 Height(Alexander): 72 Pulse(bpm): 98 Weight(lbs): 144 Blood Pressure(mmHg): 111/63 Body Mass Index(BMI): 19.5 Temperature(F): 97.8 Respiratory Rate(breaths/min): 18 [1:Photos:] [N/A:N/A] Sacrum N/A N/A Wound Location: Not Known N/A N/A Wounding Event: Pressure Ulcer N/A N/A Primary Etiology: Cataracts, Anemia, Congestive Heart N/A N/A Comorbid History: Failure, Deep Vein Thrombosis, Osteoarthritis 06/07/2023 N/A N/A Date Acquired: 0 N/A N/A Weeks of Treatment: Open N/A N/A Wound Status: No N/A N/A Wound Recurrence: 3.9x2.4x0.1 N/A N/A Measurements L x W x D (cm) 7.351 N/A N/A A (cm) : rea 0.735 N/A N/A Volume (cm) : Category/Stage III N/A N/A Classification: Medium N/A N/A Exudate A mount: Serous N/A N/A Exudate Type: amber N/A N/A Exudate Color: Thickened N/A N/A Wound Margin: Small (1-33%) N/A N/A Granulation A mount: Pink N/A N/A Granulation Quality: Large (67-100%) N/A  N/A Necrotic A mount: Fat Layer (Subcutaneous Tissue): Yes N/A N/A Exposed Structures: Fascia: No Tendon: No Muscle: No Joint: No Bone: No Medium (34-66%) N/A N/A Epithelialization: Debridement - Selective/Open Wound N/A N/A Debridement: Pre-procedure Verification/Time Out 14:00 N/A N/A Taken: Lidocaine 4% Topical Solution N/A N/A Pain Control: Slough N/A N/A Tissue Debrided: Non-Viable Tissue N/A N/A Level: 7.35 N/A N/A Debridement A (sq cm): rea Curette N/A N/A Instrument: Minimum N/A N/A Bleeding: Pressure N/A N/A Hemostasis A chieved: 0 N/A N/A Procedural Pain: 0 N/A N/A Post Procedural Pain: Procedure was tolerated well N/A N/A Debridement Treatment Response: 3.9x2.4x0.1 N/A N/A Post Debridement Measurements L x W x D (cm) 0.735 N/A N/A Post Debridement Volume: (cm) Category/Stage III N/A N/A Post Debridement Stage: No Abnormalities Noted N/A N/A Periwound Skin Texture: No Abnormalities Noted N/A N/A Periwound Skin Moisture:

## 2023-07-14 DIAGNOSIS — R338 Other retention of urine: Secondary | ICD-10-CM | POA: Diagnosis not present

## 2023-07-14 DIAGNOSIS — N401 Enlarged prostate with lower urinary tract symptoms: Secondary | ICD-10-CM | POA: Diagnosis not present

## 2023-07-14 DIAGNOSIS — R8271 Bacteriuria: Secondary | ICD-10-CM | POA: Diagnosis not present

## 2023-07-14 DIAGNOSIS — I5032 Chronic diastolic (congestive) heart failure: Secondary | ICD-10-CM | POA: Diagnosis not present

## 2023-07-14 DIAGNOSIS — J8282 Acute eosinophilic pneumonia: Secondary | ICD-10-CM | POA: Diagnosis not present

## 2023-07-14 DIAGNOSIS — I4892 Unspecified atrial flutter: Secondary | ICD-10-CM | POA: Diagnosis not present

## 2023-07-14 DIAGNOSIS — I2699 Other pulmonary embolism without acute cor pulmonale: Secondary | ICD-10-CM | POA: Diagnosis not present

## 2023-07-14 DIAGNOSIS — I480 Paroxysmal atrial fibrillation: Secondary | ICD-10-CM | POA: Diagnosis not present

## 2023-07-14 DIAGNOSIS — I82401 Acute embolism and thrombosis of unspecified deep veins of right lower extremity: Secondary | ICD-10-CM | POA: Diagnosis not present

## 2023-07-16 NOTE — Progress Notes (Unsigned)
Cardiology Office Note:    Date:  07/20/2023   ID:  [name], DOB June 17, 1930, MRN [id number]  PCP:  [name], MD  Cardiologist:  Little Ishikawa, MD  Electrophysiologist:  None   Referring MD: [name], MD   Chief Complaint  Patient presents with   Irregular Heart Beat    History of Present Illness:    [name] is a 89 y.o. male with a hx of abdominal aortic aneurysm status post stent graft in 2018, chronic diastolic heart failure, pulmonary embolism 02/2023, hyperlipidemia, polymyalgia rheumatica who presents for follow-up.  He was referred by Dr Waynard Edwards for evaluation of chest pain, initially seen 04/28/2023.  Admitted for 03/05/2023 with acute hypoxic respiratory failure in setting of pneumonia and acute PE.  He was seen by cardiology during admission for atrial fibrillation, thought to be secondary to acute pulmonary issues.  He converted to sinus rhythm on IV amiodarone and was discharged on Lopressor and Eliquis.  Echocardiogram 03/06/2023 showed EF 55 to 60%, abnormal septal motion, normal RV size/systolic function.  Zio patch x 8 days 04/2023 showed 1966 episodes of SVT with longest lasting 2 minutes with average rate 160 bpm, 1 episode of NSVT lasting 8 beats, frequent PACs (6.2%) and occasional PVCs (2.3%).  Since last clinic visit, he reports he is doing well.  Denies any chest pain, dyspnea, lightheadedness, syncope, lower extremity edema, or palpitations.   Past Medical History:  Diagnosis Date   AAA (abdominal aortic aneurysm) (HCC)    Abnormal EKG    Left atrial abnormality   Allergic rhinitis    Allergy    Benign neoplasm of colon 04/14/2010   3 small polyps on colonoscopy by Dr. Marina Goodell   Carpal tunnel syndrome    Cataract    Decubitus ulcer of coccygeal region, stage 2 (HCC) 07/06/2023   Degenerative disc disease    Disturbances metabolism of methionine, homocystine, and cystathionine (HCC)    Elevated homocysteine   Elevated prostate  specific antigen (PSA)    Elevated PSA 07/06/2023   Hearing loss    Hyperlipidemia    ILD (interstitial lung disease) (HCC) 07/06/2023   Impotence of organic origin    Penile implant   Internal hemorrhoids    Leukocytosis 07/06/2023   Neck pain    Otosclerosis of both ears    Peripheral vascular disease (HCC)    Bilateral femoral bruit   Plantar fasciitis    Polymyalgia rheumatica (HCC)    Polymyalgia rheumatica (HCC) 07/06/2023   Rotator cuff syndrome of left shoulder    Scoliosis    Shoulder pain     Past Surgical History:  Procedure Laterality Date   ABDOMINAL AORTIC ENDOVASCULAR STENT GRAFT N/A 08/16/2017   Procedure: ABDOMINAL AORTIC ENDOVASCULAR STENT GRAFT insertion;  Surgeon: Nada Libman, MD;  Location: [facility];  Service: Vascular;  Laterality: N/A;   Actinic keratosis removal  06/21/2010   Left shoulder; Dr. 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   Procedure: DUPUYTREN CONTRACTURE RELEASE RIGHT LONG, RING AND SMALL FINGERS;  Surgeon: Wyn Forster., MD;  Location: [facility];  Service: Orthopedics;  Laterality: Right;   Implant penile pump  2001   IR ANGIOGRAM PELVIS SELECTIVE OR SUPRASELECTIVE  12/11/2020   IR ANGIOGRAM SELECTIVE EACH ADDITIONAL VESSEL  12/11/2020   IR EMBO ARTERIAL NOT HEMORR HEMANG INC GUIDE ROADMAPPING  12/11/2020   IR RADIOLOGIST EVAL &  MGMT  07/30/2020   IR US GUIDE VASC ACCESS RIGHT  12/11/2020   Resection of appendix and tip of rectum  February 2005   STAPEDECTOMY Bilateral 1985, 1988   [employer]    Current Medications: Current Meds  Medication Sig   acetaminophen (TYLENOL) 325 MG tablet Take 2 tablets (650 mg total) by mouth every 6 (six) hours as needed for mild pain (or Fever >/= 101).   apixaban (ELIQUIS) 2.5 MG TABS tablet Take 2.5 mg by mouth 2 (two) times daily.   bisacodyl 5 MG EC tablet Take 10 mg by mouth daily as needed for moderate  constipation.   butalbital-acetaminophen-caffeine (FIORICET) 50-325-40 MG tablet Take 1 tablet by mouth daily as needed for headache.   cholecalciferol (VITAMIN D3) 25 MCG (1000 UNIT) tablet Take 1,000 Units by mouth daily.   diclofenac Sodium (VOLTAREN) 1 % GEL Apply 2 g topically 4 (four) times daily as needed (shoulder pain).   docusate sodium (COLACE) 100 MG capsule Take 100 mg by mouth 2 (two) times daily.   feeding supplement (ENSURE ENLIVE / ENSURE PLUS) LIQD Take 237 mLs by mouth 3 (three) times daily between meals.   ferrous sulfate 324 MG TBEC Take 324 mg by mouth daily. As needed   finasteride (PROSCAR) 5 MG tablet Take 5 mg by mouth daily.   fluorometholone (FML) 0.1 % ophthalmic suspension Place 1 drop into the right eye at bedtime.    L-Methylfolate-B12-B6-B2 (METAFOLBIC) 04-28-49-5 MG TABS TAKE ONE TABLET TWICE DAILY (Patient taking differently: Take 1 tablet by mouth 2 (two) times daily.)   LORazepam (ATIVAN) 0.5 MG tablet Take 0.5 mg by mouth daily. 0.25 am, 0.25 pm,  0.5 at night   magnesium hydroxide (MILK OF MAGNESIA) 400 MG/5ML suspension Take by mouth daily as needed for mild constipation.   melatonin 3 MG TABS tablet Take 1 tablet (3 mg total) by mouth at bedtime. (Patient taking differently: Take 5 mg by mouth as needed.)   metoprolol succinate (TOPROL XL) 25 MG 24 hr tablet Take 1 tablet (25 mg total) by mouth daily.   mirtazapine (REMERON) 15 MG tablet Take 15 mg by mouth at bedtime. 22.5 mg   Multiple Vitamins-Minerals (MULTIVITAMIN PO) Take 1 tablet by mouth daily.    pantoprazole (PROTONIX) 40 MG tablet Take 1 tablet (40 mg total) by mouth 2 (two) times daily for 30 days, THEN 1 tablet (40 mg total) daily.   polyethylene glycol (MIRALAX / GLYCOLAX) 17 g packet Take 17 g by mouth 2 (two) times daily.   predniSONE (DELTASONE) 20 MG tablet Take 2 tablets (40 mg total) by mouth daily with breakfast. (Patient taking differently: Take 10 mg by mouth daily with breakfast. 15mg   daily)   psyllium (REGULOID) 0.52 g capsule Take 0.52 g by mouth daily.   senna-docusate (SENOKOT-S) 8.6-50 MG tablet Take 1 tablet by mouth 2 (two) times daily.   tamsulosin (FLOMAX) 0.4 MG CAPS capsule Take 0.4 mg by mouth at bedtime.   Tart Cherry 1200 MG CAPS Take 1 tablet by mouth daily.   traMADol (ULTRAM) 50 MG tablet Take 50 mg by mouth every 4 (four) hours as needed for moderate pain or severe pain.   zolpidem (AMBIEN) 10 MG tablet Take 5 mg by mouth at bedtime as needed for sleep.     Allergies:   Daptomycin, Rosuvastatin, Codeine, Morphine and codeine, and Omnicef [cefdinir]   Social History   Socioeconomic History   Marital status: Married    Spouse name: Darl Pikes  Alinda Money   Number of children: 2   Years of education: Not on file   Highest education level: Not on file  Occupational History   Occupation: Loss adjuster, chartered    Employer: [employer]  Tobacco Use   Smoking status: Never   Smokeless tobacco: Never  Vaping Use   Vaping status: Never Used  Substance and Sexual Activity   Alcohol use: No    Alcohol/week: 0.0 standard drinks of alcohol   Drug use: No   Sexual activity: Not Currently  Other Topics Concern   Not on file  Social History Narrative   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   Social Determinants of Health   Financial Resource Strain: Low Risk  (03/07/2023)   Overall Financial Resource Strain (CARDIA)    Difficulty of Paying Living Expenses: Not hard at all  Food Insecurity: No Food Insecurity (03/15/2023)   Hunger Vital Sign    Worried About Running Out of Food in the Last Year: Never true    Ran Out of Food in the Last Year: Never true  Transportation Needs: No Transportation Needs (03/15/2023)   PRAPARE - Administrator, Civil Service (Medical): No    Lack of Transportation (Non-Medical): No  Physical Activity: Not on file  Stress: Not on file  Social Connections: Not  on file     Family History: The patient's family history includes Emphysema in his mother; Heart disease in his mother; Leukemia in his brother. There is no history of Colon cancer, Esophageal cancer, Rectal cancer, or Stomach cancer.  ROS:   Please see the history of present illness.     All other systems reviewed and are negative.  EKGs/Labs/Other Studies Reviewed:    The following studies were reviewed today:   EKG:   04/28/2023: Sinus tachycardia, PVCs, rate 109, LVH  Recent Labs: 03/05/2023: B Natriuretic Peptide 121.7; TSH 1.869 03/22/2023: Magnesium 1.9 07/03/2023: Pro B Natriuretic peptide (BNP) 230.0 07/05/2023: ALT 25; BUN 23; Creatinine 0.94; Hemoglobin 9.4; Platelet Count 479; Potassium 4.2; Sodium 138  Recent Lipid Panel    Component Value Date/Time   CHOL 148 09/26/2016 0804   CHOL 138 03/21/2016 0819   TRIG 71 09/26/2016 0804   HDL 52 09/26/2016 0804   HDL 56 03/21/2016 0819   CHOLHDL 2.8 09/26/2016 0804   VLDL 14 09/26/2016 0804   LDLCALC 82 09/26/2016 0804   LDLCALC 68 03/21/2016 0819    Physical Exam:    VS:  BP 102/68   Pulse 98   Ht 6' (1.829 m)   Wt 147 lb 12.8 oz (67 kg)   SpO2 94%   BMI 20.05 kg/m     Wt Readings from Last 3 Encounters:  07/19/23 147 lb 12.8 oz (67 kg)  07/05/23 147 lb (66.7 kg)  07/03/23 146 lb 3.2 oz (66.3 kg)     GEN:  Well nourished, well developed in no acute distress HEENT: Normal NECK: No JVD; No carotid bruits LYMPHATICS: No lymphadenopathy CARDIAC: RRR, no murmurs, rubs, gallops RESPIRATORY:  Clear to auscultation without rales, wheezing or rhonchi  ABDOMEN: Soft, non-tender, non-distended MUSCULOSKELETAL:  No edema; No deformity  SKIN: Warm and dry NEUROLOGIC:  Alert and oriented x 3 PSYCHIATRIC:  Normal affect   ASSESSMENT:    1. SVT (supraventricular tachycardia)   2. DOE (dyspnea on exertion)   3. PAF (paroxysmal atrial fibrillation) (HCC)   4. Anemia, unspecified type     PLAN:  Dyspnea/abdominal pain: He was referred for evaluation of chest pain, but he denies any chest pain to me.  States that he feels pain in his abdomen and shortness of breath with exertion.  Suspect this is related to his recent pneumonia and PE.  Despite age, only mild coronary calcifications on CTPA 02/2023.  No further cardiac workup recommended at this time  Paroxysmal atrial fibrillation: Noted during hospitalization 02/2023.  Started on IV amiodarone and converted to sinus rhythm.  Discharged on Lopressor and Eliquis.  Echocardiogram 03/06/2023 showed EF 55 to 60%, abnormal septal motion, normal RV size/systolic function. -Continue Eliquis -Continue Toprol-XL 25 mg daily  SVT: Zio patch x 8 days 04/2023 showed 1966 episodes of SVT with longest lasting 2 minutes with average rate 160 bpm, 1 episode of NSVT lasting 8 beats, frequent PACs (6.2%) and occasional PVCs (2.3%). -Started Toprol-XL 25 mg daily  PE/DVT: Admitted with acute PE 02/2023.  Continue Eliquis  ?  Aortitis: Completed course of antibiotics per ID 03/23/2023  AAA: Follows with vascular surgery, max aortic diameter 6.6 cm with delayed type II endoleak  Thoracic aortic aneurysm: Measures 5.1 cm, follows with vascular surgery  Anemia: Follows with heme-onc and GI, has been started on IV iron and heme-onc considering bone marrow biopsy.  Will need to monitor while on Eliquis, had CBC drawn last week, will obtain records  PMR: On prednisone  RTC in  4 months   Medication Adjustments/Labs and Tests Ordered: Current medicines are reviewed at length with the patient today.  Concerns regarding medicines are outlined above.  No orders of the defined types were placed in this encounter.  No orders of the defined types were placed in this encounter.   Patient Instructions  Medication Instructions:  Your physician recommends that you continue on your current medications as directed. Please refer to the Current Medication list given  to you today.  *If you need a refill on your cardiac medications before your next appointment, please call your pharmacy*     Follow-Up: At [facility], you and your health needs are our priority.  As part of our continuing mission to provide you with exceptional heart care, we have created designated Provider Care Teams.  These Care Teams include your primary Cardiologist (physician) and Advanced Practice Providers (APPs -  Physician Assistants and Nurse Practitioners) who all work together to provide you with the care you need, when you need it.  We recommend signing up for the patient portal called "MyChart".  Sign up information is provided on this After Visit Summary.  MyChart is used to connect with patients for Virtual Visits (Telemedicine).  Patients are able to view lab/test results, encounter notes, upcoming appointments, etc.  Non-urgent messages can be sent to your provider as well.   To learn more about what you can do with MyChart, go to [email or web address].    Your next appointment:   4 month(s)  Provider:   Little Ishikawa, MD        Signed, Little Ishikawa, MD  07/20/2023 5:51 PM    [facility]

## 2023-07-17 ENCOUNTER — Telehealth: Payer: Self-pay | Admitting: Internal Medicine

## 2023-07-17 DIAGNOSIS — I5032 Chronic diastolic (congestive) heart failure: Secondary | ICD-10-CM | POA: Diagnosis not present

## 2023-07-17 DIAGNOSIS — K5900 Constipation, unspecified: Secondary | ICD-10-CM | POA: Diagnosis not present

## 2023-07-17 DIAGNOSIS — F0390 Unspecified dementia without behavioral disturbance: Secondary | ICD-10-CM | POA: Diagnosis not present

## 2023-07-17 DIAGNOSIS — D72829 Elevated white blood cell count, unspecified: Secondary | ICD-10-CM | POA: Diagnosis not present

## 2023-07-17 DIAGNOSIS — J9601 Acute respiratory failure with hypoxia: Secondary | ICD-10-CM | POA: Diagnosis not present

## 2023-07-17 DIAGNOSIS — D638 Anemia in other chronic diseases classified elsewhere: Secondary | ICD-10-CM | POA: Diagnosis not present

## 2023-07-17 DIAGNOSIS — R5382 Chronic fatigue, unspecified: Secondary | ICD-10-CM | POA: Diagnosis not present

## 2023-07-17 DIAGNOSIS — M353 Polymyalgia rheumatica: Secondary | ICD-10-CM | POA: Diagnosis not present

## 2023-07-17 DIAGNOSIS — J8282 Acute eosinophilic pneumonia: Secondary | ICD-10-CM | POA: Diagnosis not present

## 2023-07-17 DIAGNOSIS — I82401 Acute embolism and thrombosis of unspecified deep veins of right lower extremity: Secondary | ICD-10-CM | POA: Diagnosis not present

## 2023-07-17 DIAGNOSIS — I4892 Unspecified atrial flutter: Secondary | ICD-10-CM | POA: Diagnosis not present

## 2023-07-17 DIAGNOSIS — L98429 Non-pressure chronic ulcer of back with unspecified severity: Secondary | ICD-10-CM | POA: Diagnosis not present

## 2023-07-17 DIAGNOSIS — E2740 Unspecified adrenocortical insufficiency: Secondary | ICD-10-CM | POA: Diagnosis not present

## 2023-07-17 DIAGNOSIS — I2699 Other pulmonary embolism without acute cor pulmonale: Secondary | ICD-10-CM | POA: Diagnosis not present

## 2023-07-17 DIAGNOSIS — I480 Paroxysmal atrial fibrillation: Secondary | ICD-10-CM | POA: Diagnosis not present

## 2023-07-17 NOTE — Telephone Encounter (Signed)
I do not seefollowup set up based on recent video visit

## 2023-07-18 DIAGNOSIS — I5032 Chronic diastolic (congestive) heart failure: Secondary | ICD-10-CM | POA: Diagnosis not present

## 2023-07-18 DIAGNOSIS — I2699 Other pulmonary embolism without acute cor pulmonale: Secondary | ICD-10-CM | POA: Diagnosis not present

## 2023-07-18 DIAGNOSIS — I4892 Unspecified atrial flutter: Secondary | ICD-10-CM | POA: Diagnosis not present

## 2023-07-18 DIAGNOSIS — I82401 Acute embolism and thrombosis of unspecified deep veins of right lower extremity: Secondary | ICD-10-CM | POA: Diagnosis not present

## 2023-07-18 DIAGNOSIS — I480 Paroxysmal atrial fibrillation: Secondary | ICD-10-CM | POA: Diagnosis not present

## 2023-07-18 DIAGNOSIS — J8282 Acute eosinophilic pneumonia: Secondary | ICD-10-CM | POA: Diagnosis not present

## 2023-07-19 ENCOUNTER — Telehealth: Payer: Self-pay

## 2023-07-19 ENCOUNTER — Ambulatory Visit: Payer: Medicare Other | Attending: Cardiology | Admitting: Cardiology

## 2023-07-19 ENCOUNTER — Telehealth: Payer: Self-pay | Admitting: Internal Medicine

## 2023-07-19 VITALS — BP 102/68 | HR 98 | Ht 72.0 in | Wt 147.8 lb

## 2023-07-19 DIAGNOSIS — I2699 Other pulmonary embolism without acute cor pulmonale: Secondary | ICD-10-CM | POA: Diagnosis not present

## 2023-07-19 DIAGNOSIS — I471 Supraventricular tachycardia, unspecified: Secondary | ICD-10-CM

## 2023-07-19 DIAGNOSIS — I4892 Unspecified atrial flutter: Secondary | ICD-10-CM | POA: Diagnosis not present

## 2023-07-19 DIAGNOSIS — R0609 Other forms of dyspnea: Secondary | ICD-10-CM | POA: Diagnosis not present

## 2023-07-19 DIAGNOSIS — I5032 Chronic diastolic (congestive) heart failure: Secondary | ICD-10-CM | POA: Diagnosis not present

## 2023-07-19 DIAGNOSIS — I480 Paroxysmal atrial fibrillation: Secondary | ICD-10-CM | POA: Insufficient documentation

## 2023-07-19 DIAGNOSIS — D649 Anemia, unspecified: Secondary | ICD-10-CM | POA: Diagnosis not present

## 2023-07-19 DIAGNOSIS — J8282 Acute eosinophilic pneumonia: Secondary | ICD-10-CM | POA: Diagnosis not present

## 2023-07-19 DIAGNOSIS — I82401 Acute embolism and thrombosis of unspecified deep veins of right lower extremity: Secondary | ICD-10-CM | POA: Diagnosis not present

## 2023-07-19 NOTE — Telephone Encounter (Signed)
Pt scheduled to see Dr. Marina Goodell 08/11/23 at 11:40am. Myriam Jacobson to notify pt of appt.

## 2023-07-19 NOTE — Telephone Encounter (Signed)
Inbound call from [place] at the cancer center. States patient needs to be scheduled for ov with provider for a referral that he has for positive hemoccult as well as anemia.   Offered next available but states patient needs to be seen sooner.   Please give a call to helen to further advise at [phone]  Thank you

## 2023-07-19 NOTE — Telephone Encounter (Signed)
-----   Message from Briant Cedar sent at 07/19/2023  8:16 AM EDT ----- Regarding: FW: F/U request Myriam Jacobson-  Can you call [facility] to request a follow up with Dr. Marina Goodell.   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  I imagine conservative measures will be most appropriate. Please make sure he is taking a daily PPI.  I realize it is on his medication list, but confirm that he is taking daily PPI.  Also, as you are doing, adequately replace his iron.  There are a number of reasons why he might be iron deficient and heme positive in the face of chronic anticoagulation.  Last colonoscopy 2019..Doubt that we will repeat that. Feel free to schedule him to see me in the office at some point in the near future. Thanks, Dr. Marina Goodell ----- Message ----- From: Raymondo Band Sent: 07/13/2023  11:36 AM EDT To: Hilarie Fredrickson, MD; Jaci Standard, MD Subject: F/U request                                    Dr. Marina Goodell,   Dr. Leonides Schanz and I saw Mr. Carter last week and was found to have iron deficiency anemia. Patient's PCP did stool cards x 3 which were positive. We are arrange IV iron infusion next week. We wanted to request a follow up with your team to further evaluate GI source for the iron deficiency.   Thanks, Karena Addison

## 2023-07-19 NOTE — Telephone Encounter (Signed)
Confirmed with Angelique Blonder that patient is to reduce prednisone to 10mg  per day and hold at this dose

## 2023-07-19 NOTE — Patient Instructions (Signed)
Medication Instructions:  Your physician recommends that you continue on your current medications as directed. Please refer to the Current Medication list given to you today.  *If you need a refill on your cardiac medications before your next appointment, please call your pharmacy*     Follow-Up: At [facility], you and your health needs are our priority.  As part of our continuing mission to provide you with exceptional heart care, we have created designated Provider Care Teams.  These Care Teams include your primary Cardiologist (physician) and Advanced Practice Providers (APPs -  Physician Assistants and Nurse Practitioners) who all work together to provide you with the care you need, when you need it.  We recommend signing up for the patient portal called "MyChart".  Sign up information is provided on this After Visit Summary.  MyChart is used to connect with patients for Virtual Visits (Telemedicine).  Patients are able to view lab/test results, encounter notes, upcoming appointments, etc.  Non-urgent messages can be sent to your provider as well.   To learn more about what you can do with MyChart, go to [email or web address].    Your next appointment:   4 month(s)  Provider:   Little Ishikawa, MD

## 2023-07-19 NOTE — Telephone Encounter (Signed)
An appointment with Dr Marina Goodell at [facility] has been scheduled for 08/11/23 at 11:40. Pt's wife advised and agreed to this direction in his plan of care.

## 2023-07-20 ENCOUNTER — Ambulatory Visit (HOSPITAL_BASED_OUTPATIENT_CLINIC_OR_DEPARTMENT_OTHER): Payer: Medicare Other | Admitting: General Surgery

## 2023-07-21 ENCOUNTER — Inpatient Hospital Stay: Payer: Medicare Other

## 2023-07-21 VITALS — BP 132/72 | HR 74 | Temp 98.0°F | Resp 18

## 2023-07-21 DIAGNOSIS — I4892 Unspecified atrial flutter: Secondary | ICD-10-CM | POA: Diagnosis not present

## 2023-07-21 DIAGNOSIS — I5032 Chronic diastolic (congestive) heart failure: Secondary | ICD-10-CM | POA: Diagnosis not present

## 2023-07-21 DIAGNOSIS — Z9981 Dependence on supplemental oxygen: Secondary | ICD-10-CM | POA: Diagnosis not present

## 2023-07-21 DIAGNOSIS — D72828 Other elevated white blood cell count: Secondary | ICD-10-CM | POA: Diagnosis not present

## 2023-07-21 DIAGNOSIS — D75839 Thrombocytosis, unspecified: Secondary | ICD-10-CM | POA: Diagnosis not present

## 2023-07-21 DIAGNOSIS — D509 Iron deficiency anemia, unspecified: Secondary | ICD-10-CM

## 2023-07-21 DIAGNOSIS — I82401 Acute embolism and thrombosis of unspecified deep veins of right lower extremity: Secondary | ICD-10-CM | POA: Diagnosis not present

## 2023-07-21 DIAGNOSIS — D649 Anemia, unspecified: Secondary | ICD-10-CM | POA: Diagnosis not present

## 2023-07-21 DIAGNOSIS — Z7952 Long term (current) use of systemic steroids: Secondary | ICD-10-CM | POA: Diagnosis not present

## 2023-07-21 DIAGNOSIS — Z806 Family history of leukemia: Secondary | ICD-10-CM | POA: Diagnosis not present

## 2023-07-21 DIAGNOSIS — I480 Paroxysmal atrial fibrillation: Secondary | ICD-10-CM | POA: Diagnosis not present

## 2023-07-21 DIAGNOSIS — I2699 Other pulmonary embolism without acute cor pulmonale: Secondary | ICD-10-CM | POA: Diagnosis not present

## 2023-07-21 DIAGNOSIS — J8282 Acute eosinophilic pneumonia: Secondary | ICD-10-CM | POA: Diagnosis not present

## 2023-07-21 MED ORDER — SODIUM CHLORIDE 0.9 % IV SOLN: Freq: Once | INTRAVENOUS | Status: AC

## 2023-07-21 MED ORDER — SODIUM CHLORIDE 0.9 % IV SOLN
1000.0000 mg | Freq: Once | INTRAVENOUS | Status: AC
  Administered 2023-07-21: 1000 mg via INTRAVENOUS
  Filled 2023-07-21: qty 10

## 2023-07-21 NOTE — Patient Instructions (Signed)

## 2023-07-24 ENCOUNTER — Other Ambulatory Visit: Payer: Self-pay

## 2023-07-24 ENCOUNTER — Telehealth: Payer: Self-pay

## 2023-07-24 DIAGNOSIS — I480 Paroxysmal atrial fibrillation: Secondary | ICD-10-CM | POA: Diagnosis not present

## 2023-07-24 DIAGNOSIS — I4892 Unspecified atrial flutter: Secondary | ICD-10-CM | POA: Diagnosis not present

## 2023-07-24 DIAGNOSIS — J8282 Acute eosinophilic pneumonia: Secondary | ICD-10-CM | POA: Diagnosis not present

## 2023-07-24 DIAGNOSIS — I82401 Acute embolism and thrombosis of unspecified deep veins of right lower extremity: Secondary | ICD-10-CM | POA: Diagnosis not present

## 2023-07-24 DIAGNOSIS — I5032 Chronic diastolic (congestive) heart failure: Secondary | ICD-10-CM | POA: Diagnosis not present

## 2023-07-24 DIAGNOSIS — I2699 Other pulmonary embolism without acute cor pulmonale: Secondary | ICD-10-CM | POA: Diagnosis not present

## 2023-07-24 MED ORDER — PANTOPRAZOLE SODIUM 40 MG PO TBEC: 40.0000 mg | DELAYED_RELEASE_TABLET | Freq: Two times a day (BID) | ORAL | 3 refills | Status: DC

## 2023-07-24 NOTE — Telephone Encounter (Signed)
-----   Message from Yancey Flemings sent at 07/24/2023 11:29 AM EDT ----- Regarding: FW: F/U request Leandro Reasoner and Oletta Cohn (former [facility]) are patients and friends. Darl Pikes just sent me a text message regarding [name].  He is not feeling well.  She does not feel that she can wait until September 13 to see me. Please call her and get detailed information as to what his current issues are. See what is being done for those issues. We can go from there. Very nice people. 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  I imagine conservative measures will be most appropriate. Please make sure he is taking a daily PPI.  I realize it is on his medication list, but confirm that he is taking daily PPI.  Also, as you are doing, adequately replace his iron.  There are a number of reasons why he might be iron deficient and heme positive in the face of chronic anticoagulation.  Last colonoscopy 2019..Doubt that we will repeat that. Feel free to schedule him to see me in the office at some point in the near future. Thanks, Dr. Marina Goodell ----- Message ----- From: Raymondo Band Sent: 07/13/2023  11:36 AM EDT To: Hilarie Fredrickson, MD; Jaci Standard, MD Subject: F/U request                                    Dr. Marina Goodell,   Dr. Leonides Schanz and I saw Mr. Demery last week and was found to have iron deficiency anemia. Patient's PCP did stool cards x 3 which were positive. We are arrange IV iron infusion next week. We wanted to request a follow up with your team to further evaluate GI source for the iron deficiency.   Thanks, Karena Addison

## 2023-07-24 NOTE — Telephone Encounter (Signed)
Wife states that when she wheeled [name] to the table this am he wanted to go back to bed an rest. She told him he had to eat something to take his medication. She gave him some dicyclomine and zofran and it seemed to help. He has not actually vomited, just felt nauseated. Reports he had an Iron infusion on Friday at the cancer center and he has still been taking ferrous sulfated 325mg  po. She held it today and does not know if he needs to continue the oral iron. He is not eating much. Yesterday at lunch she made him half of a peanut butter and banana sandwich and he ate half of it. At supper he had a few bites of lasagna, cottage cheese and peaches but only ate the cottage cheese. States Dr. Darcus Austin did stool cards and they were positive for blood X3. Last night he was c/o pain under the left side of his rib cage and he wanted a tramadol. She was afraid to give it to him along with his [place] and remeron. She is not sure what that pain is from. She is concerned that he is not eating much, having nausea, and had the positive stool cards. She thinks he needs to be seen sooner than 9/13.

## 2023-07-24 NOTE — Telephone Encounter (Signed)
Spoke with pts wife and she is aware of Dr. Lamar Sprinkles recommendations. New script sent in for protonix BID. She is aware of appt 08/02/23 at 11:40am.

## 2023-07-24 NOTE — Telephone Encounter (Signed)
[name], 1.  Stop oral iron as this can cause nausea.  IV iron will suffice 2.  Continue with Zofran as needed for nausea.  She could even give 15 minutes before meals, if necessary. 3.  He is on a blood thinner.  Not surprised about heme positive stools. 4.  I do not think he is fit for endoscopic procedures, at this point. 5.  I do want him on his pantoprazole 40 mg TWICE daily to protect his gastric lining.  Please get him on twice daily pantoprazole, if not already. 6.  Unfortunate, but not surprised with his fatigue and poor appetite, given all that he has been through medically, recently. 7.  Please move my 11:40 AM patient next Tuesday, September 4 to see one of the advanced practitioners 8.  Please put [name] in my 11:40 AM slot next Tuesday, September 4.  Let her know that I am limited this week due to hospital duty. 9.  Keep the already scheduled September 13 appointment (just in case). Thanks, Dr. Marina Goodell

## 2023-07-26 ENCOUNTER — Ambulatory Visit (HOSPITAL_BASED_OUTPATIENT_CLINIC_OR_DEPARTMENT_OTHER): Payer: Medicare Other | Admitting: General Surgery

## 2023-07-27 ENCOUNTER — Ambulatory Visit (HOSPITAL_BASED_OUTPATIENT_CLINIC_OR_DEPARTMENT_OTHER): Payer: Medicare Other | Admitting: General Surgery

## 2023-07-28 DIAGNOSIS — R338 Other retention of urine: Secondary | ICD-10-CM | POA: Diagnosis not present

## 2023-07-28 DIAGNOSIS — N401 Enlarged prostate with lower urinary tract symptoms: Secondary | ICD-10-CM | POA: Diagnosis not present

## 2023-08-01 DIAGNOSIS — N320 Bladder-neck obstruction: Secondary | ICD-10-CM | POA: Diagnosis not present

## 2023-08-01 DIAGNOSIS — E2740 Unspecified adrenocortical insufficiency: Secondary | ICD-10-CM | POA: Diagnosis not present

## 2023-08-01 DIAGNOSIS — R110 Nausea: Secondary | ICD-10-CM | POA: Diagnosis not present

## 2023-08-01 DIAGNOSIS — M353 Polymyalgia rheumatica: Secondary | ICD-10-CM | POA: Diagnosis not present

## 2023-08-01 DIAGNOSIS — R5382 Chronic fatigue, unspecified: Secondary | ICD-10-CM | POA: Diagnosis not present

## 2023-08-01 DIAGNOSIS — F0390 Unspecified dementia without behavioral disturbance: Secondary | ICD-10-CM | POA: Diagnosis not present

## 2023-08-01 DIAGNOSIS — L98429 Non-pressure chronic ulcer of back with unspecified severity: Secondary | ICD-10-CM | POA: Diagnosis not present

## 2023-08-01 DIAGNOSIS — J9601 Acute respiratory failure with hypoxia: Secondary | ICD-10-CM | POA: Diagnosis not present

## 2023-08-01 DIAGNOSIS — D638 Anemia in other chronic diseases classified elsewhere: Secondary | ICD-10-CM | POA: Diagnosis not present

## 2023-08-01 DIAGNOSIS — I480 Paroxysmal atrial fibrillation: Secondary | ICD-10-CM | POA: Diagnosis not present

## 2023-08-02 ENCOUNTER — Ambulatory Visit: Payer: Medicare Other | Admitting: Internal Medicine

## 2023-08-02 ENCOUNTER — Other Ambulatory Visit: Payer: Self-pay | Admitting: Urology

## 2023-08-02 DIAGNOSIS — N400 Enlarged prostate without lower urinary tract symptoms: Secondary | ICD-10-CM

## 2023-08-03 ENCOUNTER — Ambulatory Visit: Payer: Medicare Other | Admitting: Cardiology

## 2023-08-03 ENCOUNTER — Telehealth: Payer: Self-pay | Admitting: *Deleted

## 2023-08-03 ENCOUNTER — Ambulatory Visit (HOSPITAL_BASED_OUTPATIENT_CLINIC_OR_DEPARTMENT_OTHER)
Admission: RE | Admit: 2023-08-03 | Discharge: 2023-08-03 | Disposition: A | Discharge status: Home / Self Care | Payer: Medicare Other | Source: Ambulatory Visit | Attending: Internal Medicine | Admitting: Internal Medicine

## 2023-08-03 DIAGNOSIS — R109 Unspecified abdominal pain: Secondary | ICD-10-CM | POA: Diagnosis not present

## 2023-08-03 DIAGNOSIS — I7123 Aneurysm of the descending thoracic aorta, without rupture: Secondary | ICD-10-CM | POA: Diagnosis not present

## 2023-08-03 DIAGNOSIS — K7689 Other specified diseases of liver: Secondary | ICD-10-CM | POA: Diagnosis not present

## 2023-08-03 DIAGNOSIS — I7143 Infrarenal abdominal aortic aneurysm, without rupture: Secondary | ICD-10-CM | POA: Diagnosis not present

## 2023-08-03 DIAGNOSIS — I7121 Aneurysm of the ascending aorta, without rupture: Secondary | ICD-10-CM | POA: Diagnosis not present

## 2023-08-03 MED ORDER — IOHEXOL 300 MG/ML  SOLN
100.0000 mL | Freq: Once | INTRAMUSCULAR | Status: AC | PRN
  Administered 2023-08-03: 100 mL via INTRAVENOUS

## 2023-08-03 NOTE — Telephone Encounter (Signed)
Patient has been scheduled for STAT CT chest/abdomen/pelvis at [facility] at 4:45 pm. Patient should arrive at 2:45 pm for registration and oral contrast and he should remain NPO after 12:45 pm.  I attempted to reach patient/wife, Darl Pikes at number provided but unfortunately, did not get an answer x 2. I did leave a voicemail to call back asap.

## 2023-08-03 NOTE — Telephone Encounter (Signed)
===  View-only below this line=== ----- Message ----- From: Hilarie Fredrickson, MD Sent: 08/02/2023   9:31 PM EDT To: Annett Fabian, RN; Chrystie Nose, RN Subject: needs STAT CT                                  [name] (I think Bonita Quin is out),  Mr. Drews needs a STAT CT chest/abdomen/and pelvis "worsening left sided abdominal pain", today.  Please let me know when this has been scheduled (I want today). Contact his wife, Darl Pikes  his primary care giver), regarding this action.  Thanks,  JP

## 2023-08-03 NOTE — Telephone Encounter (Signed)
Spoke with [name] and gave appointment time/date/location and advised NPO 4 hours prior. She verbalizes understanding.

## 2023-08-04 ENCOUNTER — Encounter (HOSPITAL_BASED_OUTPATIENT_CLINIC_OR_DEPARTMENT_OTHER): Payer: Medicare Other | Attending: General Surgery | Admitting: General Surgery

## 2023-08-04 DIAGNOSIS — M1990 Unspecified osteoarthritis, unspecified site: Secondary | ICD-10-CM | POA: Diagnosis not present

## 2023-08-04 DIAGNOSIS — Z86718 Personal history of other venous thrombosis and embolism: Secondary | ICD-10-CM | POA: Diagnosis not present

## 2023-08-04 DIAGNOSIS — L89153 Pressure ulcer of sacral region, stage 3: Secondary | ICD-10-CM | POA: Diagnosis present

## 2023-08-04 DIAGNOSIS — E785 Hyperlipidemia, unspecified: Secondary | ICD-10-CM | POA: Diagnosis not present

## 2023-08-04 DIAGNOSIS — Z8249 Family history of ischemic heart disease and other diseases of the circulatory system: Secondary | ICD-10-CM | POA: Diagnosis not present

## 2023-08-04 DIAGNOSIS — M353 Polymyalgia rheumatica: Secondary | ICD-10-CM | POA: Insufficient documentation

## 2023-08-04 DIAGNOSIS — Z9981 Dependence on supplemental oxygen: Secondary | ICD-10-CM | POA: Insufficient documentation

## 2023-08-04 DIAGNOSIS — Z7952 Long term (current) use of systemic steroids: Secondary | ICD-10-CM | POA: Insufficient documentation

## 2023-08-04 DIAGNOSIS — I509 Heart failure, unspecified: Secondary | ICD-10-CM | POA: Diagnosis not present

## 2023-08-04 DIAGNOSIS — H348192 Central retinal vein occlusion, unspecified eye, stable: Secondary | ICD-10-CM | POA: Insufficient documentation

## 2023-08-04 DIAGNOSIS — Z86711 Personal history of pulmonary embolism: Secondary | ICD-10-CM | POA: Diagnosis not present

## 2023-08-04 DIAGNOSIS — N400 Enlarged prostate without lower urinary tract symptoms: Secondary | ICD-10-CM | POA: Diagnosis not present

## 2023-08-08 ENCOUNTER — Encounter: Payer: Self-pay | Admitting: Internal Medicine

## 2023-08-08 ENCOUNTER — Ambulatory Visit (INDEPENDENT_AMBULATORY_CARE_PROVIDER_SITE_OTHER): Payer: Medicare Other | Admitting: Internal Medicine

## 2023-08-08 VITALS — BP 100/60 | HR 85 | Ht 72.0 in | Wt 143.8 lb

## 2023-08-08 DIAGNOSIS — J849 Interstitial pulmonary disease, unspecified: Secondary | ICD-10-CM

## 2023-08-08 DIAGNOSIS — R5381 Other malaise: Secondary | ICD-10-CM

## 2023-08-08 DIAGNOSIS — R700 Elevated erythrocyte sedimentation rate: Secondary | ICD-10-CM | POA: Diagnosis not present

## 2023-08-08 DIAGNOSIS — D649 Anemia, unspecified: Secondary | ICD-10-CM | POA: Diagnosis not present

## 2023-08-08 DIAGNOSIS — R0609 Other forms of dyspnea: Secondary | ICD-10-CM | POA: Diagnosis not present

## 2023-08-08 DIAGNOSIS — R972 Elevated prostate specific antigen [PSA]: Secondary | ICD-10-CM | POA: Diagnosis not present

## 2023-08-08 LAB — BASIC METABOLIC PANEL
BUN: 37 mg/dL — ABNORMAL HIGH (ref 6–23)
CO2: 30 meq/L (ref 19–32)
Calcium: 10 mg/dL (ref 8.4–10.5)
Chloride: 104 meq/L (ref 96–112)
Creatinine, Ser: 0.91 mg/dL (ref 0.40–1.50)
GFR: 74.09 mL/min (ref >60.00)
Glucose, Bld: 117 mg/dL — ABNORMAL HIGH (ref 70–99)
Potassium: 4.3 meq/L (ref 3.5–5.1)
Sodium: 141 meq/L (ref 135–145)

## 2023-08-08 LAB — HEPATIC FUNCTION PANEL
ALT: 15 U/L (ref 0–53)
AST: 18 U/L (ref 0–37)
Albumin: 3.5 g/dL (ref 3.5–5.2)
Alkaline Phosphatase: 78 U/L (ref 39–117)
Bilirubin, Direct: 0 mg/dL (ref 0.0–0.3)
Total Bilirubin: 0.4 mg/dL (ref 0.2–1.2)
Total Protein: 7.3 g/dL (ref 6.0–8.3)

## 2023-08-08 LAB — CBC WITH DIFFERENTIAL/PLATELET
Basophils Absolute: 0.1 K/uL (ref 0.0–0.1)
Basophils Relative: 0.4 % (ref 0.0–3.0)
Eosinophils Absolute: 0.2 K/uL (ref 0.0–0.7)
Eosinophils Relative: 1.2 % (ref 0.0–5.0)
HCT: 33.9 % — ABNORMAL LOW (ref 39.0–52.0)
Hemoglobin: 10.4 g/dL — ABNORMAL LOW (ref 13.0–17.0)
Lymphocytes Relative: 9 % — ABNORMAL LOW (ref 12.0–46.0)
Lymphs Abs: 1.3 K/uL (ref 0.7–4.0)
MCHC: 30.8 g/dL (ref 30.0–36.0)
MCV: 86.1 fl (ref 78.0–100.0)
Monocytes Absolute: 1 K/uL (ref 0.1–1.0)
Monocytes Relative: 6.8 % (ref 3.0–12.0)
Neutro Abs: 12.2 K/uL — ABNORMAL HIGH (ref 1.4–7.7)
Neutrophils Relative %: 82.6 % — ABNORMAL HIGH (ref 43.0–77.0)
Platelets: 425 K/uL — ABNORMAL HIGH (ref 150.0–400.0)
RBC: 3.94 Mil/uL — ABNORMAL LOW (ref 4.22–5.81)
RDW: 24.5 % — ABNORMAL HIGH (ref 11.5–15.5)
WBC: 14.7 K/uL — ABNORMAL HIGH (ref 4.0–10.5)

## 2023-08-08 LAB — SEDIMENTATION RATE: Sed Rate: 110 mm/h — ABNORMAL HIGH (ref 0–20)

## 2023-08-08 NOTE — Patient Instructions (Addendum)
ICD-10-CM   1. Interstitial lung disease (HCC)  J84.9     2. Physical deconditioning  R53.81      Interstitial lung disease (HCC) DOE  - you have inflammation in lung since 2021/2022: This flared up in April 2024 because of the PE and likely daptomycin. Possible covid last year played a role. Currently improved and needing room air to 1L at rest and night 1L at night. HRCT aug 2024 with improvement from April 2024 but with inreased residual scar compared to baseline -> CT sept 2024 wit residual scar and holding at prednisone 10mg  per day   - Likely has IPF that flare up with daptomycin but possible he had BOOP  Plan - CMA to check sit stand test x  5times on room air wit forehead probe 08/08/2023   - reduce prednisone to 5mg  per day and hold at this dose  - see if lungs flare up with lowerd prednisone - too high risk for lung biopsy but could consider BAL sometime in future - too high risk for anti-fibrotics  - increae o2 to 2L [place] at night and see if this helps fatigue  Pulmonary embolism April 2024; submassive with RV/LV ratio 1.14  -Currently on Eliquis and tolerating well but having ongoing anemia that predated pulmonary embolism - noted per PCP stool occult blood positive August 2024 and on low dose eliquis -  Plan -DO  echocardiogram - check d-dimer 08/08/2023 - contiue low dose eliquis (Given anemia and stool occult blood positive)  Resting tachycardia  - probably multifactoria. Dr Jerene Pitch following  Plan  - get echo -per Dr Jerene Pitch  Elevated ESR x 2  year per PCP [name], MD reported 07/04/2023  - maybe no longer related to PMR annd/or ILD - maybe another cause   Plan - check ESR 08/08/2023  - monitor with reducing prednisone   Anemia and leukocytosis- new onset February 2024 and ongoing as of August 2024  - S/p 2 units packed red blood cells early August 2024.  Per PCP [name], MD -stool occult blood positive late July/early August 2024. On Iron  infusions as of 08/08/2023   Plan - check cbc with diff 08/08/2023 - [facility] Dr Leonides Schanz f  -= support bone mariow biopsy - Will discuss wit Dr Marina Goodell if colonoscopy/endoscopy +/- capsule endoscopy can be done safely  - I DO not SEE ANY  contraindication at this point pending echo report  Elevated PSA 07/03/2023 -follows with Dr Modena Slater On Foley since July/August 2024  Plan  - Will check with Dr Alvester Morin if prostate cancer is a consdieration  Physical deconditioning Fatigue  This is multifactorial -and likely due to posthospitalization, steroid related myopathy, medical illnesses [anemia, age, pulmonary issues] and age-related sarcopenia made worse by medical illnesses.. Ongoing 08/08/2023 and significant . Currtly per history - able to transfer and walk drive way with walker and change clothes mostly indepdentnely  Plan - check BMET and LFT 08/08/2023 - rehceck quantiferon gold 08/08/2023  - continue PT - optimize anemia - per PCP [name], MD  Goals of care  Plan  - sometimes risk needs to be take (bone marrow biopsy or endoscopy) as long as is not undue to get clarity on what might be going on   Followup  - face to face visit in  2-3 weeks or video visit ; 30 min to review

## 2023-08-08 NOTE — Progress Notes (Signed)
Lindenberger, [name] ([id number]) [id number] Page 1 of 8 Visit Report for 08/04/2023 Chief Complaint Document Details Patient Name: Date of Service: [name], [name]. 08/04/2023 9:15 A M Medical Record Number: [id number] Patient Account Number: [id number] Date of Birth/Sex: Treating RN: 04-Oct-1930 (89 y.o. M) Primary Care Provider: Ezequiel Kayser Other Clinician: Referring Provider: Treating Provider/Extender: Dorthea Cove [name]: 3 Information Obtained from: Patient Chief Complaint Patient is at the clinic for treatment of an open pressure ulcer Electronic Signature(Alexander) Signed: 08/04/2023 10:37:35 AM By: Duanne Guess MD FACS Entered By: Duanne Guess on 08/04/2023 07:37:35 -------------------------------------------------------------------------------- Debridement Details Patient Name: Date of Service: [name], [name]. 08/04/2023 9:15 A M Medical Record Number: [id number] Patient Account Number: [id number] Date of Birth/Sex: Treating RN: [birth year] (89 y.o. [name] Primary Care Provider: Ezequiel Kayser Other Clinician: Referring Provider: Treating Provider/Extender: Dorthea Cove [name]: 3 Debridement Performed for Assessment: Wound #1 Sacrum Performed By: Physician Duanne Guess, MD Debridement Type: Debridement Level of Consciousness (Pre-procedure): Awake and Alert Pre-procedure Verification/Time Out Yes - 09:55 Taken: Start Time: 09:56 Pain Control: Lidocaine 4% T opical Solution Percent of Wound Bed Debrided: 100% T Area Debrided (cm): otal 1.41 Tissue and other material debrided: Non-Viable, Slough, Slough Level: Non-Viable Tissue Debridement Description: Selective/Open Wound Instrument: Curette Bleeding: Minimum Hemostasis Achieved: Pressure Response to Treatment: Procedure was tolerated well Level of Consciousness (Post- Awake and Alert procedure): Post Debridement  Measurements of Total Wound Length: (cm) 1.5 Stage: Category/Stage III Width: (cm) 1.2 Depth: (cm) 0.1 Volume: (cm) 0.141 Character of Wound/Ulcer Post Debridement: Improved Post Procedure Diagnosis Same as Pre-procedure [name], [name] ([id number]) [id number] Page 2 of 8 Notes Scribed for Dr Lady Gary by Brenton Grills RN Electronic Signature(Alexander) Signed: 08/04/2023 10:59:01 AM By: Duanne Guess MD FACS Signed: 08/08/2023 3:48:27 PM By: Brenton Grills Entered By: Brenton Grills on 08/04/2023 06:56:21 -------------------------------------------------------------------------------- HPI Details Patient Name: Date of Service: [name], [name]. 08/04/2023 9:15 A M Medical Record Number: [id number] Patient Account Number: [id number] Date of Birth/Sex: Treating RN: 12-Jan-1930 (89 y.o. M) Primary Care Provider: Ezequiel Kayser Other Clinician: Referring Provider: Treating Provider/Extender: Dorthea Cove [name]: 3 History of Present Illness HPI Description: ADMISSION 07/12/2023 This is a 89 year old nondiabetic with a history of polymyalgia rheumatica on chronic steroid therapy. He has had a number of recent hospitalizations for various reasons that resulted Alexander debility. After his most recent hospital stay, he was discharged to a skilled nursing facility and returned home about a month ago. One of the issues that multiple specialists have been investigating and has been an elevated sed rate and CRP, along with chronic elevation of his white blood cell count. No obvious infectious etiology appears to have been identified. Due to his limited mobility, he is spent a fair amount of time sitting and subsequently developed a stage III sacral pressure ulcer. He does have a home health nurse/aide and his wife is very active Alexander his care. They have been applying hydrogel and a foam border dressing to the site. He has an air mattress and his wife has  been encouraging him to avoid lying or sitting on the wound site. He was referred to the wound care center by Dr. Daiva Eves Alexander infectious disease for further evaluation and management of the sacral pressure ulcer. 08/04/2023: The sacral wound is nearly healed. The purpleish area that was suspicious for pressure-induced deep tissue injury is no longer present. There is  a little bit of thin eschar on the wound surface, but it is quite dry and peeling away. Electronic Signature(Alexander) Signed: 08/04/2023 10:38:18 AM By: Duanne Guess MD FACS Entered By: Duanne Guess on 08/04/2023 07:38:18 -------------------------------------------------------------------------------- Physical Exam Details Patient Name: Date of Service: [name], [name]. 08/04/2023 9:15 A M Medical Record Number: [id number] Patient Account Number: [id number] Date of Birth/Sex: Treating RN: 1930-10-21 (89 y.o. M) Primary Care Provider: Ezequiel Kayser Other Clinician: Referring Provider: Treating Provider/Extender: Dorthea Cove [name]: 3 Constitutional . . . . no acute distress. Respiratory Normal work of breathing on supplementary oxygen. Notes 08/04/2023: The sacral wound is nearly healed. The purpleish area that was suspicious for pressure-induced deep tissue injury is no longer present. There is a little bit of thin eschar on the wound surface, but it is quite dry and peeling away. Electronic Signature(Alexander) Signed: 08/04/2023 10:38:52 AM By: Duanne Guess MD FACS [name], [name] ([id number]) [id number] Page 3 of 8 Entered By: Duanne Guess on 08/04/2023 07:38:52 -------------------------------------------------------------------------------- Physician Orders Details Patient Name: Date of Service: [name], [name]. 08/04/2023 9:15 A M Medical Record Number: [id number] Patient Account Number: [id number] Date of Birth/Sex: Treating RN: 1930/07/29 (89 y.o. [name] Primary Care Provider: Ezequiel Kayser Other Clinician: Referring Provider: Treating Provider/Extender: Dorthea Cove [name]: 3 Verbal / Phone Orders: No Diagnosis Coding ICD-10 Coding Code Description L89.153 Pressure ulcer of sacral region, stage 3 Z79.52 Long term (current) use of systemic steroids M35.3 Polymyalgia rheumatica Follow-up Appointments ppointment Alexander 2 weeks. - Dr. Lady Gary RM 1 Return A Anesthetic (Alexander clinic) Topical Lidocaine 4% applied to wound bed Bathing/ Shower/ Hygiene May shower and wash wound with soap and water. Off-Loading Other: - air mattress, avoid laying on back side, stand at least every hour during the day Additional Orders / Instructions Follow Nutritious Diet - 70-100 gms of protein daily Home Health New wound care orders this week; continue Home Health for wound care. May utilize formulary equivalent dressing for wound treatment orders unless otherwise specified. Other Home Health Orders/Instructions: - [facility] Wound Treatment Wound #1 - Sacrum Prim Dressing: Maxorb Extra Ag+ Alginate Dressing, 4x4.75 (Alexander/Alexander) Every Other Day/30 Days ary Discharge Instructions: Apply to wound bed as instructed Secondary Dressing: Zetuvit Plus Silicone Border Sacrum Dressing, Sm, 7x7 (Alexander/Alexander) Every Other Day/30 Days Discharge Instructions: Apply silicone border over primary dressing as directed. Electronic Signature(Alexander) Signed: 08/04/2023 10:59:01 AM By: Duanne Guess MD FACS Entered By: Duanne Guess on 08/04/2023 07:39:35 -------------------------------------------------------------------------------- Problem List Details Patient Name: Date of Service: [name], [name]. 08/04/2023 9:15 A M Medical Record Number: [id number] Patient Account Number: [id number] Date of Birth/Sex: Treating RN: 11-23-1930 (89 y.o. [name] Primary Care Provider: Ezequiel Kayser Other Clinician: [name] ([id number])  [id number] Page 4 of 8 Referring Provider: Treating Provider/Extender: Dorthea Cove [name]: 3 Active Problems ICD-10 Encounter Code Description Active Date MDM Diagnosis L89.153 Pressure ulcer of sacral region, stage 3 07/12/2023 No Yes Z79.52 Long term (current) use of systemic steroids 07/12/2023 No Yes M35.3 Polymyalgia rheumatica 07/12/2023 No Yes Inactive Problems Resolved Problems Electronic Signature(Alexander) Signed: 08/04/2023 10:36:15 AM By: Duanne Guess MD FACS Entered By: Duanne Guess on 08/04/2023 07:36:15 -------------------------------------------------------------------------------- Progress Note Details Patient Name: Date of Service: [name], [name]. 08/04/2023 9:15 A M Medical Record Number: [id number] Patient Account Number: [id number] Date of Birth/Sex: Treating RN: 12-11-1929 (89 y.o. M) Primary Care Provider: [name]  A Other Clinician: Referring Provider: Treating Provider/Extender: Dorthea Cove [name]: 3 Subjective Chief Complaint Information obtained from Patient Patient is at the clinic for treatment of an open pressure ulcer History of Present Illness (HPI) ADMISSION 07/12/2023 This is a 89 year old nondiabetic with a history of polymyalgia rheumatica on chronic steroid therapy. He has had a number of recent hospitalizations for various reasons that resulted Alexander debility. After his most recent hospital stay, he was discharged to a skilled nursing facility and returned home about a month ago. One of the issues that multiple specialists have been investigating and has been an elevated sed rate and CRP, along with chronic elevation of his white blood cell count. No obvious infectious etiology appears to have been identified. Due to his limited mobility, he is spent a fair amount of time sitting and subsequently developed a stage III sacral pressure ulcer. He does  have a home health nurse/aide and his wife is very active Alexander his care. They have been applying hydrogel and a foam border dressing to the site. He has an air mattress and his wife has been encouraging him to avoid lying or sitting on the wound site. He was referred to the wound care center by Dr. Daiva Eves Alexander infectious disease for further evaluation and management of the sacral pressure ulcer. 08/04/2023: The sacral wound is nearly healed. The purpleish area that was suspicious for pressure-induced deep tissue injury is no longer present. There is a little bit of thin eschar on the wound surface, but it is quite dry and peeling away. Patient History Information obtained from Patient, Chart. Family History Cancer - Siblings, Heart Disease - Mother, No family history of Diabetes, Hereditary Spherocytosis, Hypertension, Kidney Disease, Lung Disease, Seizures, Stroke, Thyroid Problems, Tuberculosis. Social History Never smoker, Marital Status - Married, Alcohol Use - Rarely, Drug Use - No History, Caffeine Use - Rarely. Medical History [name], [name] ([id number]) [id number] Page 5 of 8 Eyes Patient has history of Cataracts - bil extractions Denies history of Glaucoma, Optic Neuritis Ear/Nose/Mouth/Throat Denies history of Chronic sinus problems/congestion, Middle ear problems Hematologic/Lymphatic Patient has history of Anemia Denies history of Hemophilia, Human Immunodeficiency Virus, Lymphedema, Sickle Cell Disease Respiratory Denies history of Aspiration, Asthma, Chronic Obstructive Pulmonary Disease (COPD), Pneumothorax, Sleep Apnea, Tuberculosis Cardiovascular Patient has history of Congestive Heart Failure, Deep Vein Thrombosis Denies history of Arrhythmia Endocrine Denies history of Type I Diabetes, Type II Diabetes Genitourinary Denies history of End Stage Renal Disease Integumentary (Skin) Denies history of History of Burn Musculoskeletal Patient has  history of Osteoarthritis Denies history of Gout, Rheumatoid Arthritis, Osteomyelitis Oncologic Denies history of Received Chemotherapy, Received Radiation Psychiatric Denies history of Anorexia/bulimia, Confinement Anxiety Medical A Surgical History Notes nd Eyes retinal vein occlusion Ear/Nose/Mouth/Throat seasonal allergies Respiratory interstitial lung disease, O2 dependent, hx PE Cardiovascular AAA, hyperlipidemia, aortitis, hx PNA, eosinophic pneumonia Genitourinary BPH, foley cath Musculoskeletal polymyalgia rheumatica, degenerative disk dx, dupuytren'Alexander contracture Objective Constitutional no acute distress. Vitals Time Taken: 9:32 AM, Height: 72 Alexander, Weight: 144 lbs, BMI: 19.5, Temperature: 97.5 F, Pulse: 91 bpm, Respiratory Rate: 18 breaths/min, Blood Pressure: 107/67 mmHg. Respiratory Normal work of breathing on supplementary oxygen. General Notes: 08/04/2023: The sacral wound is nearly healed. The purpleish area that was suspicious for pressure-induced deep tissue injury is no longer present. There is a little bit of thin eschar on the wound surface, but it is quite dry and peeling away. Integumentary (Hair, Skin) Wound #1 status is Open. Original cause of  wound was Pressure Injury. The date acquired was: 06/07/2023. The wound has been [name] 3 weeks. The wound is located on the Sacrum. The wound measures 1.5cm length x 1.2cm width x 0.1cm depth; 1.414cm^2 area and 0.141cm^3 volume. There is Fat Layer (Subcutaneous Tissue) exposed. There is no tunneling or undermining noted. There is a medium amount of serous drainage noted. The wound margin is thickened. There is small (1-33%) pink granulation within the wound bed. There is a large (67-100%) amount of necrotic tissue within the wound bed including Adherent Slough. The periwound skin appearance had no abnormalities noted for texture. The periwound skin appearance had no abnormalities noted for moisture. The periwound  skin appearance had no abnormalities noted for color. Periwound temperature was noted as No Abnormality. Assessment Active Problems ICD-10 Pressure ulcer of sacral region, stage 3 Long term (current) use of systemic steroids Polymyalgia rheumatica [name], [name] ([id number]) [id number] Page 6 of 8 Procedures Wound #1 Pre-procedure diagnosis of Wound #1 is a Pressure Ulcer located on the Sacrum . There was a Selective/Open Wound Non-Viable Tissue Debridement with a total area of 1.41 sq cm performed by Duanne Guess, MD. With the following instrument(Alexander): Curette to remove Non-Viable tissue/material. Material removed includes [facility] after achieving pain control using Lidocaine 4% Topical Solution. No specimens were taken. A time out was conducted at 09:55, prior to the start of the procedure. A Minimum amount of bleeding was controlled with Pressure. The procedure was tolerated well. Post Debridement Measurements: 1.5cm length x 1.2cm width x 0.1cm depth; 0.141cm^3 volume. Post debridement Stage noted as Category/Stage III. Character of Wound/Ulcer Post Debridement is improved. Post procedure Diagnosis Wound #1: Same as Pre-Procedure General Notes: Scribed for Dr Lady Gary by Brenton Grills RN. Plan Follow-up Appointments: Return Appointment Alexander 2 weeks. - Dr. Lady Gary RM 1 Anesthetic: (Alexander clinic) Topical Lidocaine 4% applied to wound bed Bathing/ Shower/ Hygiene: May shower and wash wound with soap and water. Off-Loading: Other: - air mattress, avoid laying on back side, stand at least every hour during the day Additional Orders / Instructions: Follow Nutritious Diet - 70-100 gms of protein daily Home Health: New wound care orders this week; continue Home Health for wound care. May utilize formulary equivalent dressing for wound treatment orders unless otherwise specified. Other Home Health Orders/Instructions: - [facility] WOUND #1: - Sacrum Wound  Laterality: Prim Dressing: Maxorb Extra Ag+ Alginate Dressing, 4x4.75 (Alexander/Alexander) Every Other Day/30 Days ary Discharge Instructions: Apply to wound bed as instructed Secondary Dressing: Zetuvit Plus Silicone Border Sacrum Dressing, Sm, 7x7 (Alexander/Alexander) Every Other Day/30 Days Discharge Instructions: Apply silicone border over primary dressing as directed. 08/04/2023: The sacral wound is nearly healed. The purpleish area that was suspicious for pressure-induced deep tissue injury is no longer present. There is a little bit of thin eschar on the wound surface, but it is quite dry and peeling away. I used a curette to debride the thin eschar off of the residual open wound. The base is clean and almost completely epithelialized. We will continue silver alginate with a foam border dressing. His family and caregiver are doing an excellent job of keeping him offloaded. They will follow-up Alexander 2 weeks, at which time I anticipate he will likely be completely healed. Electronic Signature(Alexander) Signed: 08/04/2023 10:40:23 AM By: Duanne Guess MD FACS Entered By: Duanne Guess on 08/04/2023 07:40:23 -------------------------------------------------------------------------------- HxROS Details Patient Name: Date of Service: [name], Vamsi Alexander. 08/04/2023 9:15 A M Medical Record Number: [id number] Patient Account Number: [id number] Date of Birth/Sex:  Treating RN: [birth year] (89 y.o. M) Primary Care Provider: Ezequiel Kayser Other Clinician: Referring Provider: Treating Provider/Extender: Dorthea Cove [name]: 3 Information Obtained From Patient Chart Eyes Medical History: Positive for: Cataracts - bil extractions Negative for: Glaucoma; Optic Neuritis Past Medical History NotesANSEL, [name] ([id number]) [id number] Page 7 of 8 retinal vein occlusion Ear/Nose/Mouth/Throat Medical History: Negative for: Chronic sinus problems/congestion; Middle ear  problems Past Medical History Notes: seasonal allergies Hematologic/Lymphatic Medical History: Positive for: Anemia Negative for: Hemophilia; Human Immunodeficiency Virus; Lymphedema; Sickle Cell Disease Respiratory Medical History: Negative for: Aspiration; Asthma; Chronic Obstructive Pulmonary Disease (COPD); Pneumothorax; Sleep Apnea; Tuberculosis Past Medical History Notes: interstitial lung disease, O2 dependent, hx PE Cardiovascular Medical History: Positive for: Congestive Heart Failure; Deep Vein Thrombosis Negative for: Arrhythmia Past Medical History Notes: AAA, hyperlipidemia, aortitis, hx PNA, eosinophic pneumonia Endocrine Medical History: Negative for: Type I Diabetes; Type II Diabetes Genitourinary Medical History: Negative for: End Stage Renal Disease Past Medical History Notes: BPH, foley cath Integumentary (Skin) Medical History: Negative for: History of Burn Musculoskeletal Medical History: Positive for: Osteoarthritis Negative for: Gout; Rheumatoid Arthritis; Osteomyelitis Past Medical History Notes: polymyalgia rheumatica, degenerative disk dx, dupuytren'Alexander contracture Oncologic Medical History: Negative for: Received Chemotherapy; Received Radiation Psychiatric Medical History: Negative for: Anorexia/bulimia; Confinement Anxiety HBO Extended History Items Eyes: Cataracts Immunizations Pneumococcal Vaccine: Received Pneumococcal Vaccination: Yes Received Pneumococcal Vaccination On or After 60th Birthday: Yes Implantable Devices None Family and Social History [name], [name] ([id number]) [id number] Page 8 of 8 Cancer: Yes - Siblings; Diabetes: No; Heart Disease: Yes - Mother; Hereditary Spherocytosis: No; Hypertension: No; Kidney Disease: No; Lung Disease: No; Seizures: No; Stroke: No; Thyroid Problems: No; Tuberculosis: No; Never smoker; Marital Status - Married; Alcohol Use: Rarely; Drug Use: No History; Caffeine  Use: Rarely; Financial Concerns: No; Food, Clothing or Shelter Needs: No; Support System Lacking: No; Transportation Concerns: No Electronic Signature(Alexander) Signed: 08/04/2023 10:59:01 AM By: Duanne Guess MD FACS Entered By: Duanne Guess on 08/04/2023 07:38:24 -------------------------------------------------------------------------------- SuperBill Details Patient Name: Date of Service: [name], Rameses Alexander. 08/04/2023 Medical Record Number: [id number] Patient Account Number: [id number] Date of Birth/Sex: Treating RN: 11/25/1930 (89 y.o. M) Primary Care Provider: Ezequiel Kayser Other Clinician: Referring Provider: Treating Provider/Extender: Dorthea Cove [name]: 3 Diagnosis Coding ICD-10 Codes Code Description [phone] Pressure ulcer of sacral region, stage 3 Z79.52 Long term (current) use of systemic steroids M35.3 Polymyalgia rheumatica Facility Procedures : CPT4 Code: [id number] Description: [phone] - DEBRIDE WOUND 1ST 20 SQ CM OR < ICD-10 Diagnosis Description L89.153 Pressure ulcer of sacral region, stage 3 Modifier: Quantity: 1 Physician Procedures : CPT4 Code Description Modifier [id number] - WC PHYS LEVEL 3 - EST PT 25 ICD-10 Diagnosis Description L89.153 Pressure ulcer of sacral region, stage 3 Z79.52 Long term (current) use of systemic steroids M35.3 Polymyalgia rheumatica Quantity: 1 : [id number] - WC PHYS DEBR WO ANESTH 20 SQ CM ICD-10 Diagnosis Description L89.153 Pressure ulcer of sacral region, stage 3 Quantity: 1 Electronic Signature(Alexander) Signed: 08/04/2023 10:40:37 AM By: Duanne Guess MD FACS Entered By: Duanne Guess on 08/04/2023 07:40:37

## 2023-08-08 NOTE — Progress Notes (Signed)
[name] ([id number]) [id number] Page 1 of 7 Visit Report for 08/04/2023 Arrival Information Details Patient Name: Date of Service: [name], [name]. 08/04/2023 9:15 A M Medical Record Number: [id number] Patient Account Number: [id number] Date of Birth/Sex: Treating RN: Feb 26, 1930 (89 y.o. [name] Primary Care [name]: Ezequiel Kayser Other Clinician: Referring Cozetta Seif: Treating Lizandra Zakrzewski/Extender: Dorthea Cove [name]: 3 Visit Information History Since Last Visit All ordered tests and consults were completed: Yes Patient Arrived: Wheel Chair Added or deleted any medications: No Arrival Time: 09:32 Any new allergies or adverse reactions: No Accompanied By: spouse Had a fall or experienced change Alexander No Transfer Assistance: EasyPivot Patient Lift activities of daily living that may affect Patient Requires Transmission-Based Precautions: No risk of falls: Patient Has Alerts: No Signs or symptoms of abuse/neglect since last visito No Hospitalized since last visit: No Implantable device outside of the clinic excluding No cellular tissue based products placed Alexander the center since last visit: Has Dressing Alexander Place as Prescribed: Yes Pain Present Now: No Electronic Signature(Alexander) Signed: 08/08/2023 3:48:27 PM By: Brenton Grills Entered By: Brenton Grills on 08/04/2023 06:32:41 -------------------------------------------------------------------------------- Encounter Discharge Information Details Patient Name: Date of Service: [name], [name]. 08/04/2023 9:15 A M Medical Record Number: [id number] Patient Account Number: [id number] Date of Birth/Sex: Treating RN: 1929/12/18 (89 y.o. [name] Primary Care Jammie Clink: Ezequiel Kayser Other Clinician: Referring Quintavious Rinck: Treating Zalia Hautala/Extender: Dorthea Cove [name]: 3 Encounter Discharge Information Items Post Procedure  Vitals Discharge Condition: Stable Temperature (F): 98.5 Ambulatory Status: Wheelchair Pulse (bpm): 75 Discharge Destination: Home Respiratory Rate (breaths/min): 18 Transportation: Private Auto Blood Pressure (mmHg): 128/70 Accompanied By: spouse Schedule Follow-up Appointment: Yes Clinical Summary of Care: Patient Declined Electronic Signature(Alexander) Signed: 08/08/2023 3:48:27 PM By: Brenton Grills Entered By: Brenton Grills on 08/04/2023 06:57:57 [name], [name] ([id number]) [id number] Page 2 of 7 -------------------------------------------------------------------------------- Lower Extremity Assessment Details Patient Name: Date of Service: [name], [name]. 08/04/2023 9:15 A M Medical Record Number: [id number] Patient Account Number: [id number] Date of Birth/Sex: Treating RN: Apr 02, 1930 (89 y.o. [name] Primary Care Akilah Cureton: Ezequiel Kayser Other Clinician: Referring Alechia Lezama: Treating Lazer Wollard/Extender: Dorthea Cove [name]: 3 Electronic Signature(Alexander) Signed: 08/08/2023 3:48:27 PM By: Brenton Grills Entered By: Brenton Grills on 08/04/2023 06:36:37 -------------------------------------------------------------------------------- Multi Wound Chart Details Patient Name: Date of Service: [name], [name]. 08/04/2023 9:15 A M Medical Record Number: [id number] Patient Account Number: [id number] Date of Birth/Sex: Treating RN: [birth year] (89 y.o. M) Primary Care Jazmaine Fuelling: Ezequiel Kayser Other Clinician: Referring Jahzir Strohmeier: Treating Shironda Kain/Extender: Dorthea Cove [name]: 3 Vital Signs Height(Alexander): 72 Pulse(bpm): 91 Weight(lbs): 144 Blood Pressure(mmHg): 107/67 Body Mass Index(BMI): 19.5 Temperature(F): 97.5 Respiratory Rate(breaths/min): 18 [1:Photos:] [N/A:N/A] Sacrum N/A N/A Wound Location: Pressure Injury N/A N/A Wounding Event: Pressure Ulcer N/A N/A Primary  Etiology: Cataracts, Anemia, Congestive Heart N/A N/A Comorbid History: Failure, Deep Vein Thrombosis, Osteoarthritis 06/07/2023 N/A N/A Date Acquired: 3 N/A N/A Weeks of Treatment: Open N/A N/A Wound Status: No N/A N/A Wound Recurrence: 1.5x1.2x0.1 N/A N/A Measurements L x W x D (cm) 1.414 N/A N/A A (cm) : rea 0.141 N/A N/A Volume (cm) : 80.80% N/A N/A % Reduction Alexander A rea: 80.80% N/A N/A % Reduction Alexander Volume: Category/Stage III N/A N/A Classification: Medium N/A N/A Exudate A mount: Serous N/A N/A Exudate Type: amber N/A N/A Exudate Color: Thickened N/A N/A Wound Margin: Small (1-33%) N/A N/A  Granulation A mount: Pink N/A N/A Granulation QualityRiley, [name] ([id number]) [id number] Page 3 of 7 Large (67-100%) N/A N/A Necrotic Amount: Fat Layer (Subcutaneous Tissue): Yes N/A N/A Exposed Structures: Fascia: No Tendon: No Muscle: No Joint: No Bone: No Medium (34-66%) N/A N/A Epithelialization: Debridement - Selective/Open Wound N/A N/A Debridement: Pre-procedure Verification/Time Out 09:55 N/A N/A Taken: Lidocaine 4% Topical Solution N/A N/A Pain Control: Slough N/A N/A Tissue Debrided: Non-Viable Tissue N/A N/A Level: 1.41 N/A N/A Debridement A (sq cm): rea Curette N/A N/A Instrument: Minimum N/A N/A Bleeding: Pressure N/A N/A Hemostasis A chieved: Procedure was tolerated well N/A N/A Debridement Treatment Response: 1.5x1.2x0.1 N/A N/A Post Debridement Measurements L x W x D (cm) 0.141 N/A N/A Post Debridement Volume: (cm) Category/Stage III N/A N/A Post Debridement Stage: No Abnormalities Noted N/A N/A Periwound Skin Texture: No Abnormalities Noted N/A N/A Periwound Skin Moisture: No Abnormalities Noted N/A N/A Periwound Skin Color: No Abnormality N/A N/A Temperature: Debridement N/A N/A Procedures Performed: Treatment Notes Wound #1 (Sacrum) Cleanser Peri-Wound Care Topical Primary  Dressing Maxorb Extra Ag+ Alginate Dressing, 4x4.75 (Alexander/Alexander) Discharge Instruction: Apply to wound bed as instructed Secondary Dressing Zetuvit Plus Silicone Border Sacrum Dressing, Sm, 7x7 (Alexander/Alexander) Discharge Instruction: Apply silicone border over primary dressing as directed. Secured With Compression Wrap Compression Stockings Facilities manager) Signed: 08/04/2023 10:36:23 AM By: Duanne Guess MD FACS Entered By: Duanne Guess on 08/04/2023 07:36:23 -------------------------------------------------------------------------------- Multi-Disciplinary Care Plan Details Patient Name: Date of Service: [name], [name]. 08/04/2023 9:15 A M Medical Record Number: [id number] Patient Account Number: [id number] Date of Birth/Sex: Treating RN: Jan 12, 1930 (89 y.o. [name] Primary Care Malaisha Silliman: Ezequiel Kayser Other Clinician: Referring Israel Werts: Treating Hassen Bruun/Extender: Dorthea Cove [name]: 3 Multidisciplinary Care Plan reviewed with physician EMRICH, [name] ([id number]) [id number] Page 4 of 7 Active Inactive Abuse / Safety / Falls / Self Care Management Nursing Diagnoses: Impaired physical mobility Potential for falls Goals: Patient/caregiver will verbalize/demonstrate measures taken to prevent injury and/or falls Date Initiated: 07/20/2023 Target Resolution Date: 08/09/2023 Goal Status: Active Interventions: Assess: immobility, friction, shearing, incontinence upon admission and as needed Assess impairment of mobility on admission and as needed per policy Notes: Pressure Nursing Diagnoses: Knowledge deficit related to management of pressures ulcers Potential for impaired tissue integrity related to pressure, friction, moisture, and shear Goals: Patient/caregiver will verbalize understanding of pressure ulcer management Date Initiated: 07/20/2023 Target Resolution Date: 08/09/2023 Goal Status:  Active Interventions: Assess: immobility, friction, shearing, incontinence upon admission and as needed Assess potential for pressure ulcer upon admission and as needed Provide education on pressure ulcers Treatment Activities: Pressure reduction/relief device ordered : 07/12/2023 Notes: Wound/Skin Impairment Nursing Diagnoses: Impaired tissue integrity Knowledge deficit related to ulceration/compromised skin integrity Goals: Patient/caregiver will verbalize understanding of skin care regimen Date Initiated: 07/20/2023 Target Resolution Date: 08/09/2023 Goal Status: Active Ulcer/skin breakdown will have a volume reduction of 30% by week 4 Date Initiated: 07/20/2023 Target Resolution Date: 08/09/2023 Goal Status: Active Interventions: Assess patient/caregiver ability to obtain necessary supplies Assess patient/caregiver ability to perform ulcer/skin care regimen upon admission and as needed Assess ulceration(Alexander) every visit Provide education on ulcer and skin care Treatment Activities: Skin care regimen initiated : 07/12/2023 Topical wound management initiated : 07/12/2023 Notes: Electronic Signature(Alexander) Signed: 08/08/2023 3:48:27 PM By: Brenton Grills Entered By: Brenton Grills on 08/04/2023 06:43:30 Quiles, [name] ([id number]) [id number] Page 5 of 7 -------------------------------------------------------------------------------- Pain Assessment Details Patient Name: Date of Service: [name], [name]. 08/04/2023 9:15 A  M Medical Record Number: [id number] Patient Account Number: [id number] Date of Birth/Sex: Treating RN: 1930/05/09 (89 y.o. [name] Primary Care Iana Buzan: Ezequiel Kayser Other Clinician: Referring Tremond Shimabukuro: Treating Madalina Rosman/Extender: Dorthea Cove [name]: 3 Active Problems Location of Pain Severity and Description of Pain Patient Has Paino No Site Locations Pain Management and Medication Current  Pain Management: Electronic Signature(Alexander) Signed: 08/08/2023 3:48:27 PM By: Brenton Grills Entered By: Brenton Grills on 08/04/2023 06:36:27 -------------------------------------------------------------------------------- Patient/Caregiver Education Details Patient Name: Date of Service: [name], [name]. 9/6/2024andnbsp9:15 A M Medical Record Number: [id number] Patient Account Number: [id number] Date of Birth/Gender: Treating RN: 02-27-1930 (89 y.o. [name] Primary Care Physician: Ezequiel Kayser Other Clinician: Referring Physician: Treating Physician/Extender: Dorthea Cove [name]: 3 Education Assessment Education Provided To: Patient and Caregiver Education Topics Provided Wound/Skin Impairment: Methods: Explain/Verbal Responses: State content correctly [place], Jaise Alexander ([id number]) [id number] Page 6 of 7 Electronic Signature(Alexander) Signed: 08/08/2023 3:48:27 PM By: Brenton Grills Entered By: Brenton Grills on 08/04/2023 06:43:50 -------------------------------------------------------------------------------- Wound Assessment Details Patient Name: Date of Service: [name], [name]. 08/04/2023 9:15 A M Medical Record Number: [id number] Patient Account Number: [id number] Date of Birth/Sex: Treating RN: 1930/05/10 (89 y.o. [name] Primary Care Sianni Cloninger: Ezequiel Kayser Other Clinician: Referring Garron Eline: Treating Aubry Tucholski/Extender: Maceo Pro Weeks [name]: 3 Wound Status Wound Number: 1 Primary Pressure Ulcer Etiology: Wound Location: Sacrum Wound Open Wounding Event: Pressure Injury Status: Date Acquired: 06/07/2023 Comorbid Cataracts, Anemia, Congestive Heart Failure, Deep Vein Weeks Of Treatment: 3 History: Thrombosis, Osteoarthritis Clustered Wound: No Photos Wound Measurements Length: (cm) 1.5 Width: (cm) 1.2 Depth: (cm) 0.1 Area: (cm) 1.414 Volume: (cm) 0.141 %  Reduction Alexander Area: 80.8% % Reduction Alexander Volume: 80.8% Epithelialization: Medium (34-66%) Tunneling: No Undermining: No Wound Description Classification: Category/Stage III Wound Margin: Thickened Exudate Amount: Medium Exudate Type: Serous Exudate Color: amber Foul Odor After Cleansing: No Slough/Fibrino Yes Wound Bed Granulation Amount: Small (1-33%) Exposed Structure Granulation Quality: Pink Fascia Exposed: No Necrotic Amount: Large (67-100%) Fat Layer (Subcutaneous Tissue) Exposed: Yes Necrotic Quality: Adherent Slough Tendon Exposed: No Muscle Exposed: No Joint Exposed: No Bone Exposed: No Periwound Skin Texture Texture Color No Abnormalities Noted: Yes No Abnormalities Noted: Yes Moisture Temperature / Pain No Abnormalities Noted: Yes Temperature: No Abnormality Poth, [name] ([id number]) [id number] Page 7 of 7 Treatment Notes Wound #1 (Sacrum) Cleanser Peri-Wound Care Topical Primary Dressing Maxorb Extra Ag+ Alginate Dressing, 4x4.75 (Alexander/Alexander) Discharge Instruction: Apply to wound bed as instructed Secondary Dressing Zetuvit Plus Silicone Border Sacrum Dressing, Sm, 7x7 (Alexander/Alexander) Discharge Instruction: Apply silicone border over primary dressing as directed. Secured With Compression Wrap Compression Stockings Add-Ons Electronic Signature(Alexander) Signed: 08/08/2023 3:48:27 PM By: Brenton Grills Entered By: Brenton Grills on 08/04/2023 06:39:44 -------------------------------------------------------------------------------- Vitals Details Patient Name: Date of Service: [name], [name]. 08/04/2023 9:15 A M Medical Record Number: [id number] Patient Account Number: [id number] Date of Birth/Sex: Treating RN: [birth year] (89 y.o. [name] Primary Care Raylen Tangonan: Ezequiel Kayser Other Clinician: Referring Nile Prisk: Treating Aarilyn Dye/Extender: Dorthea Cove [name]: 3 Vital Signs Time Taken:  09:32 Temperature (F): 97.5 Height (Alexander): 72 Pulse (bpm): 91 Weight (lbs): 144 Respiratory Rate (breaths/min): 18 Body Mass Index (BMI): 19.5 Blood Pressure (mmHg): 107/67 Reference Range: 80 - 120 mg / dl Electronic Signature(Alexander) Signed: 08/08/2023 3:48:27 PM By: Brenton Grills Entered By: Brenton Grills on 08/04/2023 06:36:12

## 2023-08-08 NOTE — Progress Notes (Addendum)
Admit date:     01/24/2023  Discharge date: 01/25/2023  Principal Problem:   Fever Patient is a 89 year old male with past medical history of polymyalgia rheumatica on 5 mg of prednisone daily, hyperlipidemia and AAA who while on vacation last June (7 months ago) contracted a viral illness causing some upper respiratory symptoms.  At that time, patient not tested for any viral illnesses including COVID.  Soon after, this viral illness caused his PMR to flareup and patient's prednisone dosage increased from 5 mg to 25 mg (starting August 2023).  Patient's joint pain resolved sometime soon after.  However, since this viral illness, patient has had significant fatigue impacting his life. 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  Patient was seen by his rheumatologist on 2/21 and in attempt to wean down his prednisone, patient started on weekly methotrexate.  (Prednisone not yet weaned until methotrexate felt to be efficacious.)   Patient's wife noted that he has been having fevers that had started Saturday, 2/24.  Patient started first dose of weekly methotrexate Sunday, 2/25.  (Wife and patient are very clear as to when fevers are started and when methotrexate dose was first given, the day after fever started.)  Patient saw his PCP on 2/27 and given new fevers, there were concerns that patient in his steroid-induced immunosuppressed state may have endocarditis and was referred to the emergency room for further evaluation.  In the emergency room, patient noted to have normal lactic acid and a white blood cell count of 14.5 as well as a sed rate of 78.  Brought in for further evaluation.  Fever During patient's entire hospitalization, was not febrile.  Unclear etiology if he has any infection.  Chest x-ray and urinalysis unremarkable.  White blood cell count elevated, but mildly and in the  context of patient being on high-dose of steroids continuously.  Blood cultures drawn and patient was given 1 dose of cefepime and vancomycin.  Patient otherwise asymptomatic (other than complaints of fatigue).  Do not think patient has endocarditis.  He has no clinical signs.  Exam is unremarkable.   Additional possibility could be rheumatologic in nature?  Patient with elevated sed rate despite being on chronic high-dose steroids.  A new autoimmune inflammatory condition may cause a fever, although patient with no other symptoms.   No reason for additional antibiotics.  Will follow blood cultures and if positive will check echocardiogram.  Will plan to discharge.   Addendum: Blood cultures final result negative   Fatigue Had extensive discussion with patient and his wife about his fatigue.  I have a strong suspicion that he may have contracted COVID last summer and since then, has been suffering the effects of long COVID which have caused his fatigue.  (If this is indeed long COVID, discussed with patient and wife about new trials looking at treating long COVID successfully with SSRIs.  This may be something to consider as per patient's PCP.)   See below in regards to prednisone and methotrexate.   For completeness sake, ordered CMV, Epstein-Barr, [place] spotted fever and Lyme disease titers to look for other causes of fatigue.  (Addendum: Following discharge, these titers all came back negative.)   Polymyalgia rheumatica (HCC) Patient has been on 5 mg of prednisone for his polymyalgia rheumatica for some time.  The viral illness that he contracted last summer triggered a  flareup causing joint pain.  This was treated with increasing prednisone dose to 25 mg daily.   There appears to be some confusion in the efficacy of this treatment.  Patient's joint pain did indeed respond to elevated prednisone dose, however attempts to reduce prednisone dose led to worsening of fatigue.  Had extensive  discussion with patient and wife that the fatigue and joint pain are likely unrelated.  Patient probably felt worse with prednisone decreased as prednisone can often cause a feeling of increased energy and wakefulness.   There may have been confusion between patient and providers that patient unable to wean off prednisone due to failure of therapy.  Therefore, discontinuing methotrexate.  Weaning down prednisone very slowly over the next month to get patient back to 5 mg p.o. daily.  Fatigue appears to be independent and as above recommended treatment.  INPATIENT PULM COJSULT 04/06/23   Acute Hypoxic Respiratory Failure in setting possibly of multifocal pna vs eosinophilic pna versus organizing pneumonia Acute PE  PMR on chronic prednisone, immunocompromised  Patient remains on high flow nasal cannula oxygen, unable to titrated down currently Continue IV antibiotics, currently on vancomycin and cefepime, infectious disease following Started on high-dose steroid with Solu-Medrol 125 mg every 6 hours with suspected inflammatory lung disease Follow-up autoimmune panel Will try to get sputum culture and sputum pneumocystis PCR Ideally patient needs bronchoscopy with BAL and transbronchial biopsy but due to high oxygen requirement, will currently hold off Continue subcu Lovenox therapeutic dose Monitor intake and output  Xxxx Renal visit [facility] 02/07/23  Pt reports he was diagnosed with PMR 30 years ago with large proximal aches. He was started on prednisone and felt immediately better. For the next 30 years he took 5 mg a day of prednisone and did very well. 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 In June of last year he developed a virus which consisted of profound fatigue and malaise. This caused his PMR to "explode". He had loss of energy and his sed rate went up. He no longer wanted to exercise. His ESR went up to 101 and his prednisone was increased to 30 mg daily. He felt somewhat better on  this higher dose but not back to normal.   Somewhere in this interim he developed a femoral nerve neuropathy that resolved with aggressive PT.   In August he developed covid with a 102 fever. He did not require hospitalization.   On 2/21 he followed up with rheumatology who recommended tapering the prednisone. He was cut down to 25 mg. He was started on weekly methotrexate but only took one dose.   He was admitted to [facility] at the end of February due to low BP (100 systolic; normal for him is mildly hypertensive), chills, and temp to 101-102. . In the emergency room, patient noted to have normal lactic acid and a white blood cell count of 14.5 as well as a sed rate of 78. During patient's entire hospitalization, was not febrile. Unclear etiology if he has any infection. Chest x-ray and urinalysis unremarkable. White blood cell count elevated, but mildly and in the context of patient being on high-dose of steroids continuously. Blood cultures drawn and patient was given 1 dose of cefepime and vancomycin. Patient otherwise asymptomatic (other than complaints of fatigue). They stopped the methotrexate at the ER.   Since the hospitalization he's been even more fatigued. Denies red eyes, ear pain, severe sinus symptoms, hemoptysis, SOB, chest pain, significant GI symptoms, difficulty urinating, hematuria,  leg swelling, skin rashes, fevers.  Eating/drinking fine. No typical PMR muscle pains, no joint pains. He does endorse night sweats requiring him to change his pajamas.   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 He remains very active with [employer]. He has been a lifelong athlete  Xxxxxxxxxxxxxxxxxxxxxxxxxxxxxxxxxx  [facility] RHEUM VISIT [phone]  Doran Faustini is a 89 y.o. male with a history of PMR who presents for evaluation of fatigue, fevers, and hematuria noted to have a dilated thoracic aorta and abdominal aortic aneurysm, treated in past with endovascular repair. DDx for his presentation includes  possible large vessel vasculitis, infection of the aorta. We talked about the possibility of GCA today, but he does not have any cranial symptoms. GCA would also not explain the red cell casts in his urine today. He may have ANCA vasculitis, which would potentially tie together his lung nodules and urine findings. He is seeing Dr. Dolores Frame in ID today to rule out infectious causes given his recent fevers so out visit was abbreviated.  - check ANCA panel today - consider PET scan - taper prednisone - ID appointment today - Discussed with Dr. Alberteen Spindle potential admission to hospital to expedite workup   xxxxxxxxxxxxxxxxxxxxxxxxxxxxxxxxxxxxxxxxxxxxxxxxxxxxxxxxxxxxxxxxxxxxx Admit date: 03/05/2023  -Discharge date: 03/23/2023  MANVIR CAMP is a 89 y.o. male with medical history significant for polymyalgia rheumatica on chronic prednisone therapy, AAA status post endovascular stent graft in September 2018, chronic diastolic heart failure, recent diagnosis of aortitis, chronic leukocytosis, who is admitted to [facility] on 03/05/2023 with acute pulmonary embolism after presenting from home to [facility] ED complaining of generalized weakness.    [facility] for generalized weakness.  Subsequently, he was admitted to [facility] with similar initial complaint, before being discharged home on 02/13/2023.  During hospitalization at [facility], there was reportedly concern for potential aortitis, for which the patient was started on long-term current spectrum of antibiotics via PICC line, including daptomycin, Rocephin, and Bactrim.  He notes good interval compliance with these antibiotics.   However, over the last 1 to 2 days, he is noted progressive generalized weakness in the absence of any acute focal weakness.  He qualifies his generalized weakness is being significant enough to prevent him from getting out of bed over the last day, which is quite different  than his baseline functionality and activity level.  Denies any associated acute focal weakness.    He has a history of polymyalgia rheumatica and has been on chronic steroid therapy for multiple decades.  Recently, there have been outpatient attempts at reducing his dose of chronic prednisone, and he notes that the most recent dose adjustment down to 15 mg of daily prednisone occurred a few days prior to the onset of his presenting generalized weakness.   He denies any recent subjective fever, chills, rigors, or generalized myalgias.  Denies any new headache, neck stiffness, rash, cough, hemoptysis, shortness of breath, abdominal pain, diarrhea, dysuria or gross hematuria.  He also denies any recent chest pain, palpitations, diaphoresis, nausea, vomiting, dizziness, presyncope, or syncope.  No recent worsening of peripheral edema, or any recent/new lower extremity erythema or calf tenderness.  Denies any known history of prior pulmonary embolism.   Aside from the 2 hospitalizations over the last 6 weeks, denies any additional periods of prolonged diminished ambulatory activity as of recent.  No recent trauma.  No known history of underlying malignancy.  Not on any blood  thinners as an outpatient, including no aspirin.   Per chart review, it appears that he also has chronic leukocytosis with chart review revealing most recent prior white blood cell count of 18.8 on 02/28/2023, with other blood cell data points notable for 25.4 on 02/07/2023.   Denies any known baseline supplemental oxygen requirements.   ESR 105 compared to most recent prior value of 69 on 01/25/2023, CRP 12.8 compared to 75 on 02/28/2023. CBC notable for the following: Open cell count 20,000 with 89% neutroph     CTA chest, relative to CT chest from 12/02/2022, per formal radiology read, shows acute pulmonary embolism at the distal right main pulmonary artery extending into the right middle and lower pulmonary arteries, with CT evidence of  right heart strain.  Additionally, CTA chest shows pulmonary parenchymal findings suggestive of multifocal pneumonia superimposed on a background of interstitial lung disease as well as small bilateral pleural effusions.  CTA chest also shows unchanged aneurysmal dilation of the ascending, transverse, and descending thoracic aorta, without any corresponding evidence of dissection.   EDP discussed the patient's case with the on-call infectious disease physician, Dr. Zenaida Niece dam, he felt that acute pneumonia was slightly less likely given the patient's broad-spectrum IV antibiotics over the last several weeks, including daptomycin, Rocephin, and Bactrim.  He did not recommend changing daptomycin to IV vancomycin, but rather recommended continuation of daptomycin.  He did however recommend consideration for escalation of Rocephin to cefepime, with no additional changes recommended per infectious disease at this tim   Latest Reference Range & Units 03/06/23 21:40  ANA Ab, IFA  Negative  Anti JO-1 0.0 - 0.9 AI <0.2  CCP Antibodies IgG/IgA 0 - 19 units 2  RA Latex Turbid. <14.0 IU/mL 26.0 (H)  Cytoplasmic (C-ANCA) Neg:<1:20 titer <1:20  P-ANCA Neg:<1:20 titer <1:20  Atypical P-ANCA titer Neg:<1:20 titer <1:20  Anti-MPO Antibodies 0.0 - 0.9 units <0.2  Anti-PR3 Antibodies 0.0 - 0.9 units <0.2  C3 Complement 82 - 167 mg/dL 578  Complement C4, Body Fluid 12 - 38 mg/dL 28  SSA (Ro) (ENA) Antibody, IgG 0.0 - 0.9 AI <0.2  SSB (La) (ENA) Antibody, IgG 0.0 - 0.9 AI <0.2  Scleroderma (Scl-70) (ENA) Antibody, IgG 0.0 - 0.9 AI 0.4  (H): Data is abnormally high  Hospital Course:  #1 acute hypoxic respiratory failure in the setting of multifocal pneumonia versus eosinophilic pneumonia from daptomycin versus interstitial lung disease/autoimmune process/acute PE with right lower extremity DVT in the setting of PMR on chronic steroids/immunocompromise state -Patient was seen in consultation by ID, daptomycin  discontinued on 03/05/2023 due to concerns for possible eosinophilic pneumonia. -Patient with improvement currently on 1 L nasal cannula with sats of 97% at rest. -Patient noted to have been placed on high dose steroids which are being tapered down patient currently on prednisone 40 mg daily recommended to continue for the next 3 to 4 weeks at this dose until seen by PCCM in the outpatient setting. -ID recommended continuation of IV Rocephin, oral doxycycline, Bactrim for PJP prophylaxis. -Patient completed course of antibiotics 03/23/2023. -ID recommending continuation of Bactrim for pneumocystis prophylaxis while on high-dose steroids. -Will need outpatient follow-up with ID at [facility] postdischarge.   2.  Paroxysmal atrial fibrillation/atrial flutter -Seen in consultation by cardiology and felt likely secondary to acute pulmonary issues. -Noted to have converted on IV amiodarone which was continued for 24 hours and subsequently discontinued. -Patient subsequently maintained on low-dose Lopressor for rate control, Eliquis for anticoagulation.  3.  PE/right lower extremity DVT -Noted on CT angiogram chest. -Patient noted to have elevated troponins were likely secondary to demand ischemia. -Lower extremity Dopplers done positive for right lower extremity DVT. -2D echo done with abnormal septal motion, EF 55 to 60%,NWMA, no evidence of cor pulmonale in the setting of recent PE. -Patient initially placed on IV heparin and subsequently transition to Eliquis.   -Patient improved clinically.   -Patient will be discharged on Eliquis.   -Outpatient follow-up with pulmonary and PCP.    4.  AAA status post graft and possible aortitis -Patient noted to have been on daptomycin per ID at [facility] which was discontinued during this hospitalization due to concerns for eosinophilic pneumonia. -Patient received a full course of IV Rocephin and IV doxycycline which was completed during the hospitalization on day of  discharge.  -Patient also maintained on Bactrim for PJP prophylaxis per ID recommendations while on steroids.  Patient will be discharged on Bactrim for PJP prophylaxis. -Outpatient follow-up with ID at [facility].   5.  Acute delirium/anxiety -Possibly secondary to hospital delirium versus from hypoxia versus steroid-induced. -Improved clinically.      6.  PMR -Patient noted to chronically be on 5 mg of prednisone at home was on high-dose steroids and tapered down to 40 mg of prednisone daily secondary to problem #1 by day of discharge.  Patient be discharged on prednisone 40 mg daily until follow-up with pulmonary.     8.  Anemia of chronic disease -H&H stable. -No overt bleeding.   9. chronic leukocytosis -Patient noted to have a chronic leukocytosis could be secondary to steroid induced. -Labs with some improvement with leukocytosis by day of discharge.   -Patient completed course of antibiotics during the hospitalization, remained afebrile.   -Outpatient follow up with hematology for follow-up on leukocytosis.   -Ambulatory referral placed. -Patient will be discharged to a skilled nursing facility.   10.  GERD -Patient maintained on PPI, Pepcid, Zofran.         13.  Pressure injury, not present on admission Pressure Injury 03/12/23 Sacrum Mid Stage 1 -  Intact skin with non-blanchable redness of a localized area usually over a bony prominence. Pink non blanching on sacrum (Active)    Previous [facility] pulmonary encounter:  05/01/2023- Dr. Francine Graven     Chief Complaint  Patient presents with   Hospitalization Follow-up      HFU. Still using 4L of O2.     Oddis Janssens is a 89 year old male with history of polymyalgia rheumatica on chronic prednisone and recent hospitalization at [facility] where he was diagnosed with aortitis and started on daptomycin and rocephin via PICC line on 3/14. He presented to [facility] on 4/7 with generalized weakness and shortness of breath. CTA Chest on  admission showed pulmonary embolism and bilateral patchy ground-glass and consolidative opacities with interlobular septal thickening superimposed on back ground subpleural reticulation and bronchiolectasis. Echocardiogram did not show RV strain. He was started on heparin drip. He required transfer to the ICU for acute hypoxemic respiratory failure with heated high flow oxygen requirements. He was started on pulse dose steroids and broad spectrum antibiotics. Bronchoscopy was not performed due to his significant oxygen requirements. He was transferred out of the ICU on 4/18 with decreasing O2 requirements. He was discharged on 40mg  of prednisone daily and bactrim prophylaxis. He was started on nystatinc for thrush. He was discharged on 1-2L of oxygen and went to rehab.    He started  having bowel trouble at [facility] rehab for about a month with constipation. He has been started on aggressive bowel regimen and has required manual disimpaction per his wife. He had large explosive bowel movement in clinic today.    His prednisone was reduced to 20mg  daily at rehab due to concerns of intolerance of high dose therapy. He continues bactrim DS 1 tab 3 days per week.    He is working on walking with a walker, he walked 32 steps yesterday without assistance.    He has reduced appetite and is trying to drink protein supplements daily.     06/14/2023 - Interim hx Patient presents today for 1 month follow-up/acute office visit.  He was seen by Dr. Francine Graven on 05/01/2023 hospital follow-up due to respiratory failure in the setting of interstitial lung disease.  Patient has a history of Polymyalgia rheumatica on chronic prednisone.  Differential for respiratory failure includes eosinophilic pneumonia in the setting of daptomycin use versus progressive ILD process.  Patient has mild basilar reticulation on CT chest from March 2022.  Inflammatory workup has been negative.  Follow-up chest x-ray on 05/01/2023 showed patchy  bilateral interstitial and alveolar opacity bilaterally not significantly changed compared to prior chest x-ray from April 2024.    Accompanied by his wife and nursing aid today Breathing is about the same  He is on supplemental oxygen, for the most part he is using 1L oxygen at home  He is on 20mg  prednisone daily with Bactrim double strength 1 tablet 3 days/week for pneumocystitis prophylaxis.  Need to discuss prednisone taper if able    Patient had UTI symptoms (frequency/urgency, fever and confusion) on July 6. He had some air hunger at this time. No associated URI symptoms or cough. Prescribed abx Augmentin x 10 days and doxycycline x10 days. CXR done with Dr. Waynard Edwards, results are not accessible in Epic. He is doing better. He is more tired last several days. He is having some stomach/abdomen discomfort. He does struggle with constipation.  He had a small bowel movement today.  He has been compliant with stool softener.  Plan  - reduce prednisone to 15mg  per day  OV 07/04/2023 -transfer of care to Dr. Marchelle Gearing in the ILD center.  Subjective:  Patient ID: [name], male , DOB: 1930/04/17 , age 20 y.o. , MRN: [id number] , ADDRESS: [address] [place] [address] PCP [name], MD Patient Care Team: [name], MD as PCP - General (Internal Medicine) Little Ishikawa, MD as PCP - Cardiology (Cardiology) Holli Humbles, MD as Referring Physician (Ophthalmology) Pryor Ochoa, MD (Inactive) as Consulting Physician (Vascular Surgery) Hilarie Fredrickson, MD as Consulting Physician (Gastroenterology)  This Provider for this visit: Treatment Team:  Attending Provider: Kalman Shan, MD    07/04/2023 -   Chief Complaint  Patient presents with   Hospitalization Follow-up    New pt. And advise on health     HPI [name] 89 y.o. 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  Here with his wife Darl Pikes and also caretaker Bonita Quin.  History is provided by the  wife, caretaker, a little bit also from the patient also review of the external medical record.  All this was done on 07/03/2023.  Subsequent history also taken on 07/04/2023 the following day from [place].  Neither primary care physician.  The very complex story.  It appears that even at baseline he has had some mild atelectasis changes in his lower lung fields but this was essentially asymptomatic  and he was caring about his activities of daily living.   Has chronic history of polymyalgia rheumatica and has been on low-dose steroids for a few decades.  He has had persistently elevated ESR at least between 70-100 for the last 2 years according to PCP.  At some point because of possible PMR flare he was subjected increased prednisone dose but he always had this fatigue.   Review of the records indicate that starting February 2024 he started having leukocytosis and new onset anemia.  He did have an admission in the hospital at the time for fever.  Subsequently had extensive workup in the spring 2024 at [facility].  He had an admission for this.  They have an considered whether he might have aortitis.  This was in mid March 2024.  He was then treated with daptomycin in the hospital.  He was discharged in early April 2024 with a PICC line but shortly after that at home he collapsed.  Was diagnosed with submassive PE and interstitial pneumonitis.  He had no evidence of RV strain on the echo but his RV-LV ratio was 1.14.  Is on high flow oxygen.  Was given high-dose steroids.  During this time had ICU delirium including according to both primary care physician and D Vandam the infectious disease physician that I spoke to neuro suicidal ideations.  He was then discharged to [facility] where he spent 9 weeks and finally got home on 05/31/2023.  He is currently undergoing intense physical therapy at home.  During all this his high flow oxygen needs have improved he is such that he is only on 1-2 L nasal  cannula at rest.  The caretaker states that sometimes even at rest he is got normal pulse ox and he can go several hours without using oxygen.  He is able to get around with a walker but when he walks a certain distance such as 30 to 40 feet he will get extremely tired.   Overall the wife initially said that he was not any better but they did admit that he is now more conditioned.  He is also having reduced oxygen needs but the fatigue is the main component.  In talking to primary care physician as well fatigue seems to be the biggest component.  In fact a few days ago primary care physician called him in and had his hemoglobin checked it was 7 g%.  He just finished 2 units of transfusion.  He is somewhat better but still feeling extremely fatigued.  His leukocytosis continues and wife is frustrated about this and feels this might be associated with the fatigue.  In terms of his PMR and ILD his current prednisone treatment is at 15 mg/day.  This was slowly tapered since his hospitalization in April 2024.  Dr. Particia Jasper had instructed him to take 15 mg/day.  This was a clinical taper.  Despite different doses of prednisone his ESR is always continue to be high and the reason for this is not known.  In terms of pulmonary embolism he continues his Eliquis low dose His last echocardiogram was in April 2024.  His EF is 55 to 60% and had no evidence of RV strain.   OV 07/10/2023  Subjective:  Patient ID: [name], male , DOB: [birth year] , age 24 y.o. , MRN: [id number] , ADDRESS: [address] [place] [address] PCP [name], MD Patient Care Team: [name], MD as PCP - General (Internal Medicine)  Little Ishikawa, MD as PCP - Cardiology (Cardiology) Holli Humbles, MD as Referring Physician (Ophthalmology) Pryor Ochoa, MD (Inactive) as Consulting Physician (Vascular Surgery) Hilarie Fredrickson, MD as Consulting Physician (Gastroenterology)  This Provider for this  visit: Treatment Team:  Attending Provider: Kalman Shan, MD  Type of visit: Video Virtual Visit Identification of patient ETHIN FORTENBERRY with 10-18-1930 and MRN [id number] - 2 person identifier Risks: Risks, benefits, limitations of telephone visit explained. Patient understood and verbalized agreement to proceed Anyone else on call: His wife.  Also the caretaker.  He himself could not join Patient location: His home This provider location: [address]; [place]; [place] [address]. [facility]. [phone]    07/10/2023 -follow-up respiratory failure.   HPI RANDE KERSTEIN 89 y.o. - In this video visit the purpose is to discuss CT scan results and then to catch up.  I personally visualized the CT scan over time and also showed it to the wife and the caretaker.  He has had some basal chronic ILD changes even 2022 in 2021.  It was extremely mild and could have been passed off as atelectasis.  He also had this in early 2024.  However clearly in April 2024 he had significant groundglass opacities and alveolar airspace filling.  Dense consolidation.  This could easily have been Boop with a high ESR but then his ESR is been chronically elevated for over 2 years according to PCP.  He has responded to steroids.  His lungs have improved and left with residual scar although the level of residual scar seems worse than his baseline.  There is currently very little any groundglass opacities currently on 15 mg prednisone since mid July 2024.  He is stable on 1 L nasal cannula occasionally 2 L according to caretaker.  We took a shared decision making to reduce to 10 mg/day [his baseline was 5 mg to-10 mg/day for his PMR].  Did indicate to the wife there is a small chance that things could flareup but overall this chronic toxicity to deal with with prednisone and therefore we took a shared decision making to reduce.  Regarding his other issues such as fatigue, anemia, other  reasons for elevated ESR he is seeing hematology.  I have also spoken infectious disease doctor.  If he has a bone marrow biopsy they will do cultures.  His QuantiFERON gold was indeterminate.  I spoke to Dr. Algis Liming he does not think patient has TB.  I agree with that.  His anemia also appears to have improved.    OV 08/08/2023  Subjective:  Patient ID: [name], male , DOB: [birth year] , age 28 y.o. , MRN: [id number] , ADDRESS: [address] [place] [address] PCP [name], MD Patient Care Team: [name], MD as PCP - General (Internal Medicine) Little Ishikawa, MD as PCP - Cardiology (Cardiology) Holli Humbles, MD as Referring Physician (Ophthalmology) Pryor Ochoa, MD (Inactive) as Consulting Physician (Vascular Surgery) Hilarie Fredrickson, MD as Consulting Physician (Gastroenterology)  This Provider for this visit: Treatment Team:  Attending Provider: Kalman Shan, MD    08/08/2023 -   Chief Complaint  Patient presents with   Follow-up    F/up on ILD     HPI HONG NIEWINSKI 89 y.o. -last visit was in mid August 2024.  In the interim I did run into his son and wife outside the hospital at a social event.  They reported that  fatigue continues although he is trying to ambulate and continue with physical therapy.  They have held off on the bone marrow biopsy because of concerns of pain.  The current focus is sto see progress iron infusions.  His most recent labs in our system is from 07/05/2023.  He continues to have normal renal function but he continues to be anemic with a hemoglobin 9.4 g% and is iron deficient.  His thrombocytosis continues.  And his ESR is elevated greater than 100.  He did visit Dr. Denzil Magnuson in cardiology on 07/19/2023 for his SVT was noticed to be doing stable on Toprol.  He was continuing his LOW DOSE Eliquis.  Given his anemia Dr. Yancey Flemings gastroenterologist on 07/24/2023 felt colonoscopy was putting him at high  risk.  Then on 08/03/2023 with complaints of abdominal pain.  Has had CT scan showed significant stool and consistent with constipation.  Today he presents with his caretaker Bonita Quin, wife and his son.  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  -Most recent visit 07/06/2023 with Dr. Algis Liming infectious diseases.  Infection considered low possibility but if bone marrow is ever done for his anemia recommended AFB cultures.  ll#Interstitial lung disease/dyspnea on exertion  -Had flareup in April 2022 associate with PE and also daptomycin.  [Daptomycin given for suspected aortitis at [facility]]  -He remains on oxygen 2 L nasal cannula.  But many times he has been observed to be saturating well when he is off oxygen.  The caretaker states that when she sometimes gives him a shower and helps him with that he is not desaturating when he is not on oxygen..  In fact today after we turned his oxygen off he was able to do a sit/stand test without any desaturations.  He is using oxygen at night but this has not helped his fatigue.  On his CT abdomen lung image August 03, 2023 the ILD is mild.  He is currently on 10 mg prednisone which is a lower dose compared to his last visit.  They are willing to get a taper down to 5 mg per my advice.  #History of pulm embolism April 2024 for submassive PE -     -He is on low-dose Eliquis noW.  I ordered an echo last time but the CMA indicated that this order was not placed.  Therefore have reordered this.  Will also check a D-dimer.  If there is no evidence of pulmonary hypertension this reduces risk for any anesthetic complications from any potential procedures.  #Iron deficiency anemia with heme positive stools.  Status post PRBC on 06/27/2023-Associated thrombocytosis and leukocytosis.  Initial heme-onc consult 07/05/2023   -Did see Dr. Leonides Schanz and his physician assistant Karena Addison on 07/05/2023.  There is  shared decision making taken to continue with iron but through a infusion form and see if he improves.  Last hemoglobin check was on that day 9.4 g%.  According to the family the iron infusions have not helped.  They deferred a bone marrow biopsy because of concerns of pain.  Although today they indicated that patient's 47 year old brother in [place] has had bone marrow biopsy recently and was able to tolerate it well.  -Most recent infusion was on 07/21/2023.   -CT abdomen September 2024 just showed a lot of stool and constipation   -Dr. Marina Goodell has indicated colonoscopy might be of significant risk although today has been normoxic on room  air and was able to do sit and stand test.-> discussed with him 08/08/2023 - feels supportive care best. He has been in touch with them  #Elevated ESR greater since February 2024 and [facility] health medical records (according to primary care physician present for couple of years]   - No clear etiology known.  He in the past he has not responded to increase prednisone for polymyalgia rheumatica.  We will recheck this again today.  #History of SVT approximately atrial fibrillation April 2024 follows with Dr. Gaynelle Arabian  -Most recent visit was on 07/19/2023.  Noted to be on Toprol  #Elevated PSA 07/03/2023  - The been following with [facility].  For the last 2 months he has now had a Foley catheter because of enlarged prostate they are not aware of a prostate cancer diagnosis.  I did reach out to Dr. Alvester Morin and have asked him to call me back but he has a chronic Foley for the last 2 months now.  No issues currently on this.   -Did discuss with Dr. Alvester Morin.  He does not think prostate cancer is a possibility.  Moreover with the age and no evidence of metastasis it is a nonissue.  He plans for IR embolization of the prostate to shrink it and have the Foley catheter come out.  #Physical deconditioning and fatigue   -His functional strength is improved he  is able to transfer better.  He is able to sit stand.  He is able to walk in the hallway or in the driveway with his walker.  However fatigue is significant.  The son took him to [employer] course and had him sit in the golf cart.  They watched for golf teams come by and after the patient got exhausted and had to go home despite eating a hot dog.   #g symptoms burden  -All significant and see below.  -I do not think the ILD is contributing to the symptom on the other hand is multifactorial and from anemia and high ESR and frailty and cachexia.  -His appetite is diminished but he is able to eat and not gaining weight though.  #Decub ulcer  -Being followed by wound care   #Goals of care  -Remains full code.  They have hesitated on undergoing procedures because of risk and bone marrow biopsy because of pain.   I did indicate to them that sometimes risk might have to be taken as long it is not undue to undergo certain procedures in order to establish clarity in the prognosis and diagnosis.  If reversible etiologies are found then he can improve but if your reversible etiologies are found then at least we have clarity.  They are processing this information. Ultimtely risk v beneift to be dcided by them/patoient an dproceduralists in shared decisin maknig      [place] Symptom Assessment Numerical Scale 0 is no problem -> 10 worst problem 08/08/2023    No Pain -> Worst pain 5  No Tiredness -> Worset tiredness 8  No Nausea -> Worst nausea 7  No Depression -> Worst depression 5  No Anxiety -> Worst Anxiety 6  No Drowsiness -> Worst Drowsiness 0  Best appetite-> Worst Appetitle 5  Best Feeling of well being -> Worst feeling 6  No dyspnea-> Worst dyspnea 1  Other problem (none -> severe) x  Completed by  Care guver ad patietn        CT Chest data from date:  SPET 2024  - personally visualized and independently interpreted :YES - my findings are: MILD ild AT BASE   Simple office  walk 224 (66+46 x 2) feet Pod A at [facility] x  3 laps goal with forehead probe 08/08/2023    O2 used ra   Number laps completed Sit stand x 5    Comments about pace Slow but can do   Resting Pulse Ox/HR 100% and 125/min   Final Pulse Ox/HR 100% and 134/min   Desaturated </= 88% no   Desaturated <= 3% points no   Got Tachycardic >/= 90/min yes   Symptoms at end of test x   Miscellaneous comments tachycardia       PFT      No data to display            LAB RESULTS last 96 hours No results found.  LAB RESULTS last 90 days Recent Results (from the past 2160 hour(s))  Comp Met (CMET)     Status: Abnormal   Collection Time: 06/14/23 12:17 PM  Result Value Ref Range   Sodium 136 135 - 145 mEq/L   Potassium 4.8 3.5 - 5.1 mEq/L   Chloride 101 96 - 112 mEq/L   CO2 27 19 - 32 mEq/L   Glucose, Bld 107 (H) 70 - 99 mg/dL   BUN 34 (H) 6 - 23 mg/dL   Creatinine, Ser 4.69 (H) 0.40 - 1.50 mg/dL   Total Bilirubin 0.3 0.2 - 1.2 mg/dL   Alkaline Phosphatase 77 39 - 117 U/L   AST 20 0 - 37 U/L   ALT 15 0 - 53 U/L   Total Protein 6.5 6.0 - 8.3 g/dL   Albumin 3.3 (L) 3.5 - 5.2 g/dL   GFR 62.95 (L) >28.41 mL/min    Comment: Calculated using the CKD-EPI Creatinine Equation (2021)   Calcium 8.9 8.4 - 10.5 mg/dL  CBC with Differential     Status: Abnormal   Collection Time: 06/14/23 12:17 PM  Result Value Ref Range   WBC 14.7 (H) 4.0 - 10.5 K/uL   RBC 3.15 (L) 4.22 - 5.81 Mil/uL   Hemoglobin 8.1 Repeated and verified X2. (L) 13.0 - 17.0 g/dL   HCT 32.4 Repeated and verified X2. (L) 39.0 - 52.0 %   MCV 82.7 78.0 - 100.0 fl   MCHC 31.1 30.0 - 36.0 g/dL   RDW 40.1 (H) 02.7 - 25.3 %   Platelets 584.0 (H) 150.0 - 400.0 K/uL   Neutrophils Relative % 81.1 (H) 43.0 - 77.0 %   Lymphocytes Relative 10.2 (L) 12.0 - 46.0 %   Monocytes Relative 7.8 3.0 - 12.0 %   Eosinophils Relative 0.5 0.0 - 5.0 %   Basophils Relative 0.4 0.0 - 3.0 %   Neutro Abs 11.9 (H) 1.4 - 7.7 K/uL   Lymphs Abs 1.5  0.7 - 4.0 K/uL   Monocytes Absolute 1.1 (H) 0.1 - 1.0 K/uL   Eosinophils Absolute 0.1 0.0 - 0.7 K/uL   Basophils Absolute 0.1 0.0 - 0.1 K/uL  Sed Rate (ESR)     Status: Abnormal   Collection Time: 06/14/23 12:17 PM  Result Value Ref Range   Sed Rate 104 (H) 0 - 20 mm/hr  D-Dimer, Quantitative     Status: Abnormal   Collection Time: 06/14/23 12:17 PM  Result Value Ref Range   D-Dimer, Quant 1.63 (H) <0.50 mcg/mL FEU    Comment: . The D-Dimer test is used frequently to exclude an acute PE  or DVT. In patients with a low to moderate clinical risk assessment and a D-Dimer result <0.50 mcg/mL FEU, the likelihood of a PE or DVT is very low. However, a thromboembolic event should not be excluded solely on the basis of the D-Dimer level. Increased levels of D-Dimer are associated with a PE, DVT, DIC, malignancies, inflammation, sepsis, surgery, trauma, pregnancy, and advancing patient age. [Jama 2006 11:295(2):199-207] . For additional information, please refer to: http://education.[facility].com/faq/FAQ149 (This link is being provided for informational/ educational purposes only) .   Brain natriuretic peptide     Status: Abnormal   Collection Time: 06/14/23 12:17 PM  Result Value Ref Range   Pro B Natriuretic peptide (BNP) 243.0 (H) 0.0 - 100.0 pg/mL  Type and screen [facility]     Status: None   Collection Time: 06/27/23  8:25 AM  Result Value Ref Range   ABO/RH(D) O POS    Antibody Screen NEG    Sample Expiration 06/30/2023,2359    Unit Number [id number]    Blood Component Type RED CELLS,LR    Unit division 00    Status of Unit ISSUED,FINAL    Transfusion Status OK TO TRANSFUSE    Crossmatch Result Compatible    Unit Number [id number]    Blood Component Type RED CELLS,LR    Unit division 00    Status of Unit ISSUED,FINAL    Transfusion Status OK TO TRANSFUSE    Crossmatch Result      Compatible Performed at [facility], [address], [place], [place] [address]   Prepare RBC (crossmatch)     Status: None   Collection Time: 06/27/23  8:25 AM  Result Value Ref Range   Order Confirmation      ORDER PROCESSED BY BLOOD BANK Performed at [facility] [address], [place], [place] [address]   BPAM RBC     Status: None   Collection Time: 06/27/23  8:25 AM  Result Value Ref Range   ISSUE DATE / TIME [id number]    Blood Product Unit Number [id number]    PRODUCT CODE [id number]    Unit Type and Rh 5100    Blood Product Expiration Date 322025427062    ISSUE DATE / TIME [id number]    Blood Product Unit Number [id number]    PRODUCT CODE [id number]    Unit Type and Rh 5100    Blood Product Expiration Date 009381829937   QuantiFERON-TB Gold Plus     Status: Abnormal   Collection Time: 07/03/23  9:49 AM  Result Value Ref Range   QuantiFERON-TB Gold Plus INDETERMINATE (A) NEGATIVE    Comment: Results are indeterminate for  response to ESAT-6 and/or  CFP-10 test antigens.    NIL 0.02 IU/mL   Mitogen-NIL 0.45 IU/mL   TB1-NIL 0.00 IU/mL   TB2-NIL 0.00 IU/mL    Comment: . The Nil tube value reflects the background interferon gamma immune response of the patient's blood sample. This value has been subtracted from the patient's displayed TB and Mitogen results. . Lower than expected results with the Mitogen tube prevent false-negative Quantiferon readings by detecting a patient with a potential immune suppressive condition and/or suboptimal pre-analytical specimen handling. . The TB1 Antigen tube is coated with the M. tuberculosis-specific antigens designed to elicit responses from TB antigen primed CD4+ helper T-lymphocytes. . The TB2 Antigen tube is coated with the M. tuberculosis-specific antigens designed to elicit responses from TB antigen primed CD4+ helper and CD8+ cytotoxic T-lymphocytes. Marland Kitchen  For additional information, please refer  to https://education.[facility].com/faq/FAQ204 (This link is being provided for informational/ educational purposes only.) .   PSA     Status: Abnormal   Collection Time: 07/03/23  9:49 AM  Result Value Ref Range   PSA 6.85 (H) 0.10 - 4.00 ng/mL    Comment: Test performed using Access Hybritech PSA Assay, a parmagnetic partical, chemiluminecent immunoassay.  Acetylcholine Receptor, Binding     Status: None   Collection Time: 07/03/23  9:49 AM  Result Value Ref Range   A CHR BINDING ABS <0.30 nmol/L    Comment: . Reference Ranges for Acetylcholine Receptor   Binding Antibody: .  Negative: < or =0.30 nmol/L Equivocal:  0.31-0.49 nmol/L  Positive: > or =0.50 nmol/L   CK (Creatine Kinase)     Status: None   Collection Time: 07/03/23  9:49 AM  Result Value Ref Range   Total CK 19 7 - 232 U/L  Basic Metabolic Panel (BMET)     Status: Abnormal   Collection Time: 07/03/23  9:49 AM  Result Value Ref Range   Sodium 139 135 - 145 mEq/L   Potassium 4.5 3.5 - 5.1 mEq/L   Chloride 99 96 - 112 mEq/L   CO2 29 19 - 32 mEq/L   Glucose, Bld 121 (H) 70 - 99 mg/dL   BUN 23 6 - 23 mg/dL   Creatinine, Ser 1.61 0.40 - 1.50 mg/dL   GFR 09.60 >45.40 mL/min    Comment: Calculated using the CKD-EPI Creatinine Equation (2021)   Calcium 9.2 8.4 - 10.5 mg/dL  Sed Rate (ESR)     Status: Abnormal   Collection Time: 07/03/23  9:49 AM  Result Value Ref Range   Sed Rate 114 (H) 0 - 20 mm/hr  CBC w/Diff     Status: Abnormal   Collection Time: 07/03/23  9:49 AM  Result Value Ref Range   WBC 20.5 Repeated and verified X2. (HH) 4.0 - 10.5 K/uL   RBC 3.86 (L) 4.22 - 5.81 Mil/uL   Hemoglobin 9.9 (L) 13.0 - 17.0 g/dL   HCT 98.1 (L) 19.1 - 47.8 %   MCV 82.6 78.0 - 100.0 fl   MCHC 31.2 30.0 - 36.0 g/dL   RDW 29.5 (H) 62.1 - 30.8 %   Platelets 566.0 (H) 150.0 - 400.0 K/uL   Neutrophils Relative % 85.6 aH (H) 43.0 - 77.0 %   Lymphocytes Relative 6.9 aL (L) 12.0 - 46.0 %   Monocytes Relative 6.8 3.0 -  12.0 %   Eosinophils Relative 0.4 0.0 - 5.0 %   Basophils Relative 0.3 0.0 - 3.0 %   Neutro Abs 17.5 (H) 1.4 - 7.7 K/uL   Lymphs Abs 1.4 0.7 - 4.0 K/uL   Monocytes Absolute 1.4 (H) 0.1 - 1.0 K/uL   Eosinophils Absolute 0.1 0.0 - 0.7 K/uL   Basophils Absolute 0.1 0.0 - 0.1 K/uL  B Nat Peptide     Status: Abnormal   Collection Time: 07/03/23  9:49 AM  Result Value Ref Range   Pro B Natriuretic peptide (BNP) 230.0 (H) 0.0 - 100.0 pg/mL  JAK2 (INCLUDING V617F AND EXON 12), MPL,& CALR W/RFL MPN PANEL (NGS)     Status: None   Collection Time: 07/05/23 10:28 AM  Result Value Ref Range   JAK 2, MPL, CARL, MPN SEE SEPARATE REPORT     Comment: Performed at [facility], [address] [place], [place] [address]  BCR ABL1 FISH ([facility])  Status: None   Collection Time: 07/05/23 10:28 AM  Result Value Ref Range   BCR ABL1 / ABL1 SEE SEPARATE REPORT     Comment: Performed at [facility], [address], [place], [place] [address]  Copper, serum     Status: Abnormal   Collection Time: 07/05/23 10:28 AM  Result Value Ref Range   Copper 147 (H) 69 - 132 ug/dL    Comment: (NOTE) This test was developed and its performance characteristics determined by [facility]. It has not been cleared or approved by the Food and Drug Administration.                                Detection Limit = 5 Performed At: [facility] [place] [address] [place], [place] [id number] Jolene Schimke MD [id number]   Kappa/lambda light chains     Status: Abnormal   Collection Time: 07/05/23 10:28 AM  Result Value Ref Range   Kappa free light chain 84.4 (H) 3.3 - 19.4 mg/L   Lambda free light chains 24.2 5.7 - 26.3 mg/L   Kappa, lambda light chain ratio 3.49 (H) 0.26 - 1.65    Comment: (NOTE) Performed At: [facility] [address] [place], [place] [id number] Jolene Schimke MD [id number]   Methylmalonic acid, serum     Status: None   Collection  Time: 07/05/23 10:28 AM  Result Value Ref Range   Methylmalonic Acid, Quantitative 253 0 - 378 nmol/L    Comment: (NOTE) This test was developed and its performance characteristics determined by [facility]. It has not been cleared or approved by the Food and Drug Administration. Performed At: [facility] [address] [place], [place] [id number] Jolene Schimke MD [id number]   Haptoglobin     Status: Abnormal   Collection Time: 07/05/23 10:28 AM  Result Value Ref Range   Haptoglobin 359 (H) 38 - 329 mg/dL    Comment: (NOTE) Performed At: [facility] [address] [place], [place] [id number] Jolene Schimke MD [id number]   Vitamin B12     Status: Abnormal   Collection Time: 07/05/23 10:28 AM  Result Value Ref Range   Vitamin B-12 3,440 (H) 180 - 914 pg/mL    Comment: RESULT CONFIRMED BY MANUAL DILUTION (NOTE) This assay is not validated for testing neonatal or myeloproliferative syndrome specimens for Vitamin B12 levels. Performed at [facility], [address] [place], [place] [address]   Iron and Iron Binding Capacity ([facility] only)     Status: Abnormal   Collection Time: 07/05/23 10:28 AM  Result Value Ref Range   Iron 26 (L) 45 - 182 ug/dL   TIBC 010 932 - 355 ug/dL   Saturation Ratios 10 (L) 17.9 - 39.5 %   UIBC 227 117 - 376 ug/dL    Comment: Performed at [facility], [address] [place], [place] [address]  CMP (Cancer Center only)     Status: Abnormal   Collection Time: 07/05/23 10:28 AM  Result Value Ref Range   Sodium 138 135 - 145 mmol/L   Potassium 4.2 3.5 - 5.1 mmol/L   Chloride 101 98 - 111 mmol/L   CO2 30 22 - 32 mmol/L   Glucose, Bld 119 (H) 70 - 99 mg/dL    Comment: Glucose reference range applies only to samples taken after fasting for at least 8 hours.   BUN 23 8 - 23 mg/dL  Creatinine 0.94 0.61 - 1.24 mg/dL   Calcium 9.2 8.9 - 40.9 mg/dL   Total Protein 6.8 6.5 - 8.1 g/dL    Albumin 3.3 (L) 3.5 - 5.0 g/dL   AST 22 15 - 41 U/L   ALT 25 0 - 44 U/L   Alkaline Phosphatase 80 38 - 126 U/L   Total Bilirubin 0.3 0.3 - 1.2 mg/dL   GFR, Estimated >81 >19 mL/min    Comment: (NOTE) Calculated using the CKD-EPI Creatinine Equation (2021)    Anion gap 7 5 - 15    Comment: Performed at [facility], [address] [place], [place] [address]  CBC with Differential (Cancer Center Only)     Status: Abnormal   Collection Time: 07/05/23 10:28 AM  Result Value Ref Range   WBC Count 17.4 (H) 4.0 - 10.5 K/uL   RBC 3.62 (L) 4.22 - 5.81 MIL/uL   Hemoglobin 9.4 (L) 13.0 - 17.0 g/dL   HCT 95.6 (L) 21.3 - 08.6 %   MCV 82.3 80.0 - 100.0 fL   MCH 26.0 26.0 - 34.0 pg   MCHC 31.5 30.0 - 36.0 g/dL   RDW 57.8 (H) 46.9 - 62.9 %   Platelet Count 479 (H) 150 - 400 K/uL   nRBC 0.0 0.0 - 0.2 %   Neutrophils Relative % 84 %   Neutro Abs 14.5 (H) 1.7 - 7.7 K/uL   Lymphocytes Relative 8 %   Lymphs Abs 1.4 0.7 - 4.0 K/uL   Monocytes Relative 6 %   Monocytes Absolute 1.1 (H) 0.1 - 1.0 K/uL   Eosinophils Relative 1 %   Eosinophils Absolute 0.2 0.0 - 0.5 K/uL   Basophils Relative 0 %   Basophils Absolute 0.1 0.0 - 0.1 K/uL   Immature Granulocytes 1 %   Abs Immature Granulocytes 0.14 (H) 0.00 - 0.07 K/uL    Comment: Performed at [facility], [address] [place], [place] [address]  Save Smear for Provider Slide Review     Status: None   Collection Time: 07/05/23 10:28 AM  Result Value Ref Range   Smear Review SMEAR STAINED AND AVAILABLE FOR REVIEW     Comment: Performed at [facility], [address], [place], [place] [address]  Multiple Myeloma Panel (SPEP&IFE w/QIG)     Status: Abnormal   Collection Time: 07/05/23 10:29 AM  Result Value Ref Range   IgG (Immunoglobin G), Serum 733 603 - 1,613 mg/dL   IgA 102 61 - 725 mg/dL   IgM (Immunoglobulin M), Srm 535 (H) 15 - 143 mg/dL   Total Protein ELP 5.9  (L) 6.0 - 8.5 g/dL   Albumin SerPl Elph-Mcnc 2.6 (L) 2.9 - 4.4 g/dL   Alpha 1 0.3 0.0 - 0.4 g/dL   Alpha2 Glob SerPl Elph-Mcnc 1.0 0.4 - 1.0 g/dL   B-Globulin SerPl Elph-Mcnc 0.9 0.7 - 1.3 g/dL   Gamma Glob SerPl Elph-Mcnc 1.1 0.4 - 1.8 g/dL   M Protein SerPl Elph-Mcnc 0.3 (H) Not Observed g/dL    Comment: An additional m spike was observed at a concentration of 0.2 g/dl    Globulin, Total 3.3 2.2 - 3.9 g/dL   Albumin/Glob SerPl 0.8 0.7 - 1.7   IFE 1 Comment (A)     Comment: (NOTE) 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    Please Note Comment     Comment: (NOTE) Protein electrophoresis scan will follow via computer, mail, or  courier delivery. Performed At: [facility] [address] [place], [place] [id number] Jolene Schimke MD [id number]   Sample to Blood Bank     Status: None   Collection Time: 07/05/23 10:29 AM  Result Value Ref Range   Blood Bank Specimen SAMPLE AVAILABLE FOR TESTING    Sample Expiration      07/08/2023,2359 Performed at [facility], [address], [place], [place] [address]   Retic Panel     Status: Abnormal   Collection Time: 07/05/23 10:29 AM  Result Value Ref Range   Retic Ct Pct 1.0 0.4 - 3.1 %   RBC. 3.49 (L) 4.22 - 5.81 MIL/uL   Retic Count, Absolute 34.6 19.0 - 186.0 K/uL   Immature Retic Fract 23.1 (H) 2.3 - 15.9 %   Reticulocyte Hemoglobin 27.8 (L) >27.9 pg    Comment:        A RET-He < 28 pg is an indication of iron-deficient or iron- insufficient erythropoiesis. Patients with thalassemia may also have a decreased RET-He result unrelated to iron availability.     If this patient has chronic kidney disease and does not have a hemoglobinopathy he/she meets criteria for iron deficiency per the 2016 NICE guidelines. Refer to specific guidelines to determine the appropriate thresholds for treating CKD- associated iron deficiency. TSAT and ferritin should be used  in patients with hemoglobinopathies (e.g. thalassemia). Performed at [facility], [address] [place], [place] [address]   Lactate dehydrogenase (LDH)     Status: None   Collection Time: 07/05/23 10:29 AM  Result Value Ref Range   LDH 154 98 - 192 U/L    Comment: Performed at [facility], [address] [place], [place] [address]  Ferritin     Status: None   Collection Time: 07/05/23 10:30 AM  Result Value Ref Range   Ferritin 47 24 - 336 ng/mL    Comment: Performed at [facility], [address], [place], [place] [address]         has a past medical history of AAA (abdominal aortic aneurysm) (HCC), Abnormal EKG, Allergic rhinitis, Allergy, Benign neoplasm of colon (04/14/2010), Carpal tunnel syndrome, Cataract, Decubitus ulcer of coccygeal region, stage 2 (HCC) (07/06/2023), Degenerative disc disease, Disturbances metabolism of methionine, homocystine, and cystathionine (HCC), Elevated prostate specific antigen (PSA), Elevated PSA (07/06/2023), Hearing loss, Hyperlipidemia, ILD (interstitial lung disease) (HCC) (07/06/2023), Impotence of organic origin, Internal hemorrhoids, Leukocytosis (07/06/2023), Neck pain, Otosclerosis of both ears, Peripheral vascular disease (HCC), Plantar fasciitis, Polymyalgia rheumatica (HCC), Polymyalgia rheumatica (HCC) (07/06/2023), Rotator cuff syndrome of left shoulder, Scoliosis, and Shoulder pain.   reports that he has never smoked. He has never used smokeless tobacco.  Past Surgical History:  Procedure Laterality Date   ABDOMINAL AORTIC ENDOVASCULAR STENT GRAFT N/A 08/16/2017   Procedure: ABDOMINAL AORTIC ENDOVASCULAR STENT GRAFT insertion;  Surgeon: Nada Libman, MD;  Location: [facility];  Service: Vascular;  Laterality: N/A;   Actinic keratosis removal  06/21/2010   Left shoulder; Dr. Irene Limbo   COLONOSCOPY     COLONOSCOPY W/ POLYPECTOMY     DUPUYTREN CONTRACTURE  RELEASE Right 12/05/2013   Procedure: DUPUYTREN CONTRACTURE RELEASE RIGHT LONG, RING AND SMALL FINGERS;  Surgeon: Wyn Forster., MD;  Location: [facility];  Service: Orthopedics;  Laterality: Right;   Implant penile pump  2001   IR ANGIOGRAM PELVIS SELECTIVE OR SUPRASELECTIVE  12/11/2020   IR ANGIOGRAM SELECTIVE EACH ADDITIONAL VESSEL  12/11/2020  IR EMBO ARTERIAL NOT HEMORR HEMANG INC GUIDE ROADMAPPING  12/11/2020   IR RADIOLOGIST EVAL & MGMT  07/30/2020   IR US GUIDE VASC ACCESS RIGHT  12/11/2020   Resection of appendix and tip of rectum  February 2005   STAPEDECTOMY Bilateral 1985, 1988   [employer]    Allergies  Allergen Reactions   Daptomycin Other (See Comments)    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   Rosuvastatin Other (See Comments)    Stopped taking due to feeling achy     Codeine Nausea And Vomiting   Morphine And Codeine Nausea And Vomiting   Omnicef [Cefdinir] Other (See Comments)    Abdominal pain    Immunization History  Administered Date(s) Administered   Dtap, Unspecified 06/29/2010   Influenza, High Dose Seasonal PF 09/01/2017, 08/31/2022   Influenza, Quadrivalent, Recombinant, Inj, Pf 09/01/2018, 08/31/2019, 08/19/2020, 09/24/2021   Influenza,inj,Quad PF,6+ Mos 08/20/2013, 08/26/2014, 09/23/2015, 10/04/2016   Influenza-Unspecified 09/22/2006, 09/26/2007, 10/13/2012   PFIZER(Purple Top)SARS-COV-2 Vaccination 12/10/2019, 12/28/2019, 08/10/2020   Pneumococcal Conjugate-13 05/30/2018   Pneumococcal Polysaccharide-23 07/21/1998, 10/26/2022   Tdap 06/29/2010    Family History  Problem Relation Age of Onset   Heart disease Mother    Emphysema Mother    Leukemia Brother        Chronic lymphocytic leukemia   Colon cancer Neg Hx    Esophageal cancer Neg Hx    Rectal cancer Neg Hx    Stomach cancer Neg Hx      Current Outpatient Medications:    acetaminophen (TYLENOL) 325 MG tablet, Take 2 tablets (650 mg total) by mouth every 6 (six) hours  as needed for mild pain (or Fever >/= 101)., Disp: , Rfl:    apixaban (ELIQUIS) 2.5 MG TABS tablet, Take 2.5 mg by mouth 2 (two) times daily., Disp: , Rfl:    bisacodyl 5 MG EC tablet, Take 10 mg by mouth daily as needed for moderate constipation., Disp: , Rfl:    butalbital-acetaminophen-caffeine (FIORICET) 50-325-40 MG tablet, Take 1 tablet by mouth daily as needed for headache., Disp: 14 tablet, Rfl: 0   cholecalciferol (VITAMIN D3) 25 MCG (1000 UNIT) tablet, Take 1,000 Units by mouth daily., Disp: , Rfl:    diclofenac Sodium (VOLTAREN) 1 % GEL, Apply 2 g topically 4 (four) times daily as needed (shoulder pain)., Disp: 2 g, Rfl: 0   docusate sodium (COLACE) 100 MG capsule, Take 100 mg by mouth 2 (two) times daily., Disp: , Rfl:    feeding supplement (ENSURE ENLIVE / ENSURE PLUS) LIQD, Take 237 mLs by mouth 3 (three) times daily between meals., Disp: 237 mL, Rfl: 12   ferrous sulfate 324 MG TBEC, Take 324 mg by mouth daily. As needed, Disp: , Rfl:    finasteride (PROSCAR) 5 MG tablet, Take 5 mg by mouth daily., Disp: , Rfl:    fluorometholone (FML) 0.1 % ophthalmic suspension, Place 1 drop into the right eye at bedtime. , Disp: , Rfl:    L-Methylfolate-B12-B6-B2 (METAFOLBIC) 04-28-49-5 MG TABS, TAKE ONE TABLET TWICE DAILY (Patient taking differently: Take 1 tablet by mouth 2 (two) times daily.), Disp: 60 tablet, Rfl: 2   LORazepam (ATIVAN) 0.5 MG tablet, Take 0.5 mg by mouth daily. 0.25 am, 0.25 pm,  0.5 at night, Disp: , Rfl:    magnesium hydroxide (MILK OF MAGNESIA) 400 MG/5ML suspension, Take by mouth daily as needed for mild constipation., Disp: , Rfl:    melatonin 3 MG TABS tablet, Take 1 tablet (3 mg total) by mouth at bedtime. (  Patient taking differently: Take 5 mg by mouth as needed.), Disp: , Rfl: 0   metoprolol succinate (TOPROL XL) 25 MG 24 hr tablet, Take 1 tablet (25 mg total) by mouth daily., Disp: 90 tablet, Rfl: 3   mirtazapine (REMERON) 15 MG tablet, Take 15 mg by mouth at bedtime.  22.5 mg, Disp: , Rfl:    Multiple Vitamins-Minerals (MULTIVITAMIN PO), Take 1 tablet by mouth daily. , Disp: , Rfl:    pantoprazole (PROTONIX) 40 MG tablet, Take 1 tablet (40 mg total) by mouth 2 (two) times daily before a meal., Disp: 60 tablet, Rfl: 3   polyethylene glycol (MIRALAX / GLYCOLAX) 17 g packet, Take 17 g by mouth 2 (two) times daily., Disp: , Rfl:    predniSONE (DELTASONE) 20 MG tablet, Take 2 tablets (40 mg total) by mouth daily with breakfast. (Patient taking differently: Take 10 mg by mouth daily with breakfast. 15mg  daily), Disp: , Rfl:    psyllium (REGULOID) 0.52 g capsule, Take 0.52 g by mouth daily., Disp: , Rfl:    senna-docusate (SENOKOT-S) 8.6-50 MG tablet, Take 1 tablet by mouth 2 (two) times daily., Disp: , Rfl:    tamsulosin (FLOMAX) 0.4 MG CAPS capsule, Take 0.4 mg by mouth at bedtime., Disp: , Rfl:    Tart Cherry 1200 MG CAPS, Take 1 tablet by mouth daily., Disp: , Rfl:    traMADol (ULTRAM) 50 MG tablet, Take 50 mg by mouth every 4 (four) hours as needed for moderate pain or severe pain., Disp: , Rfl:    zolpidem (AMBIEN) 10 MG tablet, Take 5 mg by mouth at bedtime as needed for sleep., Disp: , Rfl:    doxycycline (VIBRAMYCIN) 100 MG capsule, Take 100 mg by mouth 2 (two) times daily. (Patient not taking: Reported on 07/19/2023), Disp: , Rfl:    fish oil-omega-3 fatty acids 1000 MG capsule, Take 1,000 mg by mouth daily.  (Patient not taking: Reported on 07/19/2023), Disp: , Rfl:       Objective:   Vitals:   08/08/23 1118  BP: 100/60  Pulse: 85  SpO2: 97%  Weight: 143 lb 12.8 oz (65.2 kg)  Height: 6' (1.829 m)    Estimated body mass index is 19.5 kg/m as calculated from the following:   Height as of this encounter: 6' (1.829 m).   Weight as of this encounter: 143 lb 12.8 oz (65.2 kg).  @WEIGHTCHANGE @  [employer]   08/08/23 1118  Weight: 143 lb 12.8 oz (65.2 kg)     Physical Exam   General: No distress. O2 at rest: yes but pulse o x on room  ai Cane present: no Sitting in wheel chair: YES Frail: YES Obese: no Neuro: Alert and Oriented x 3. GCS 15. Speech normal Psych: Pleasant Resp:  Barrel Chest - no.  Wheeze - no, Crackles - YES LUNG BASE, No overt respiratory distress CVS: Normal heart sounds. Murmurs - no Ext: Stigmata of Connective Tissue Disease - no HEENT: Normal upper airway. PEERL +. No post nasal drip        Assessment:       ICD-10-CM   1. DOE (dyspnea on exertion)  R06.09 ECHOCARDIOGRAM COMPLETE    2. Interstitial lung disease (HCC)  J84.9 D-Dimer, Quantitative    Sed Rate (ESR)    CBC w/Diff    Basic Metabolic Panel (BMET)    Hepatic function panel    QuantiFERON-TB Gold Plus    QuantiFERON-TB Gold Plus    Hepatic function panel  Basic Metabolic Panel (BMET)    CBC w/Diff    Sed Rate (ESR)    D-Dimer, Quantitative    3. Physical deconditioning  R53.81     4. Elevated sed rate  R70.0     5. Anemia, unspecified type  D64.9     6. PSA elevation  R97.20          Plan:     Patient Instructions     ICD-10-CM   1. Interstitial lung disease (HCC)  J84.9     2. Physical deconditioning  R53.81      Interstitial lung disease (HCC) DOE  - you have inflammation in lung since 2021/2022: This flared up in April 2024 because of the PE and likely daptomycin. Possible covid last year played a role. Currently improved and needing room air to 1L at rest and night 1L at night. HRCT aug 2024 with improvement from April 2024 but with inreased residual scar compared to baseline -> CT sept 2024 wit residual scar and holding at prednisone 10mg  per day   - Likely has IPF that flare up with daptomycin but possible he had BOOP  Plan - CMA to check sit stand test x  5times on room air wit forehead probe 08/08/2023   - reduce prednisone to 5mg  per day and hold at this dose  - see if lungs flare up with lowerd prednisone - too high risk for lung biopsy but could consider BAL sometime in future - too  high risk for anti-fibrotics  - increae o2 to 2L [place] at night and see if this helps fatigue  Pulmonary embolism April 2024; submassive with RV/LV ratio 1.14  -Currently on Eliquis and tolerating well but having ongoing anemia that predated pulmonary embolism - noted per PCP stool occult blood positive August 2024 and on low dose eliquis -  Plan -DO  echocardiogram - check d-dimer 08/08/2023 - contiue low dose eliquis (Given anemia and stool occult blood positive)  Resting tachycardia  - probably multifactoria. Dr Jerene Pitch following  Plan  - get echo -per Dr Jerene Pitch  Elevated ESR x 2  year per PCP [name], MD reported 07/04/2023  - maybe no longer related to PMR annd/or ILD - maybe another cause   Plan - check ESR 08/08/2023  - monitor with reducing prednisone   Anemia and leukocytosis- new onset February 2024 and ongoing as of August 2024  - S/p 2 units packed red blood cells early August 2024.  Per PCP [name], MD -stool occult blood positive late July/early August 2024. On Iron infusions as of 08/08/2023   Plan - check cbc with diff 08/08/2023 - [facility] Dr Leonides Schanz f  -= support bone mariow biopsy - Will discuss wit Dr Marina Goodell if colonoscopy/endoscopy +/- capsule endoscopy can be done safely  - I DO not SEE ANY  contraindication at this point pending echo report  Elevated PSA 07/03/2023 -follows with Dr Modena Slater On [place] since July/August 2024  Plan  - Will check with Dr Alvester Morin if prostate cancer is a consdieration  Physical deconditioning Fatigue  This is multifactorial -and likely due to posthospitalization, steroid related myopathy, medical illnesses [anemia, age, pulmonary issues] and age-related sarcopenia made worse by medical illnesses.. Ongoing 08/08/2023 and significant . Currtly per history - able to transfer and walk drive way with walker and change clothes mostly indepdentnely  Plan - check BMET and LFT 08/08/2023 - rehceck quantiferon gold  08/08/2023  - continue PT - optimize anemia - per  PCP [name], MD  Goals of care  Plan  - sometimes risk needs to be take (bone marrow biopsy or endoscopy) as long as is not undue to get clarity on what might be going on   Followup  - face to face visit in  2-3 weeks or video visit ; 30 min to review   FOLLOWUP Return in about 3 weeks (around 08/29/2023) for 30 min visit, with Dr Marchelle Gearing, Face to Face OR Video Visit.   ( Level 05 visit E&M 2024: Estb >= 40 min   visit type: on-site physical face to visit  in total care time and counseling or/and coordination of care by this undersigned MD - Dr Kalman Shan. This includes one or more of the following on this same day 08/08/2023: pre-charting, chart review, note writing, documentation discussion of test results, diagnostic or treatment recommendations, prognosis, risks and benefits of management options, instructions, education, compliance or risk-factor reduction. It excludes time spent by the CMA or office staff in the care of the patient. Actual time 50 min)    SIGNATURE    Dr. Kalman Shan, M.D., F.C.C.P,  Pulmonary and Critical Care Medicine Staff Physician, [facility] Center Director - Interstitial Lung Disease  Program  Pulmonary Fibrosis [facility] at [facility] [place], [place], [address]  Pager: [phone], If no answer or between  15:00h - 7:00h: call [phone] Telephone: [phone]  3:38 PM 08/08/2023

## 2023-08-09 DIAGNOSIS — L821 Other seborrheic keratosis: Secondary | ICD-10-CM | POA: Diagnosis not present

## 2023-08-09 DIAGNOSIS — L570 Actinic keratosis: Secondary | ICD-10-CM | POA: Diagnosis not present

## 2023-08-09 DIAGNOSIS — Z85828 Personal history of other malignant neoplasm of skin: Secondary | ICD-10-CM | POA: Diagnosis not present

## 2023-08-10 ENCOUNTER — Telehealth: Payer: Self-pay | Admitting: Internal Medicine

## 2023-08-10 NOTE — Telephone Encounter (Signed)
Called wife :   Jovita Gamma results - all better but for ESR. Updated her about my conversations with Dr Marina Goodell and Dr Modena Slater. She is seeing Dr Marina Goodell 08/11/23. Advised to continue low dose eliquis due to ongoing elevated D-dimer     SIGNATURE    Dr. Kalman Shan, M.D., F.C.C.P,  Pulmonary and Critical Care Medicine Staff Physician, [facility] Center Director - Interstitial Lung Disease  Program  Pulmonary Fibrosis [facility] at [facility] [place], [place], [address]   Pager: [phone], If no answer  -> Check AMION or Try [phone] Telephone (clinical office): [phone] Telephone (research): [phone]  8:24 PM 08/10/2023

## 2023-08-11 ENCOUNTER — Ambulatory Visit (INDEPENDENT_AMBULATORY_CARE_PROVIDER_SITE_OTHER): Payer: Medicare Other | Admitting: Internal Medicine

## 2023-08-11 ENCOUNTER — Encounter: Payer: Self-pay | Admitting: Internal Medicine

## 2023-08-11 VITALS — BP 124/70 | HR 100 | Ht 73.0 in | Wt 148.0 lb

## 2023-08-11 DIAGNOSIS — R195 Other fecal abnormalities: Secondary | ICD-10-CM | POA: Diagnosis not present

## 2023-08-11 DIAGNOSIS — R531 Weakness: Secondary | ICD-10-CM

## 2023-08-11 DIAGNOSIS — R1012 Left upper quadrant pain: Secondary | ICD-10-CM | POA: Diagnosis not present

## 2023-08-11 DIAGNOSIS — D638 Anemia in other chronic diseases classified elsewhere: Secondary | ICD-10-CM

## 2023-08-11 DIAGNOSIS — R112 Nausea with vomiting, unspecified: Secondary | ICD-10-CM | POA: Diagnosis not present

## 2023-08-11 DIAGNOSIS — R700 Elevated erythrocyte sedimentation rate: Secondary | ICD-10-CM

## 2023-08-11 DIAGNOSIS — K219 Gastro-esophageal reflux disease without esophagitis: Secondary | ICD-10-CM | POA: Diagnosis not present

## 2023-08-11 LAB — QUANTIFERON-TB GOLD PLUS
Mitogen-NIL: 0.03 [IU]/mL
NIL: 0.01 [IU]/mL
QuantiFERON-TB Gold Plus: UNDETERMINED — AB
TB1-NIL: 0 [IU]/mL
TB2-NIL: 0 [IU]/mL

## 2023-08-11 LAB — D-DIMER, QUANTITATIVE: D-Dimer, Quant: 1.85 ug{FEU}/mL — ABNORMAL HIGH (ref <0.50)

## 2023-08-11 MED ORDER — ONDANSETRON HCL 4 MG PO TABS: 4.0000 mg | ORAL_TABLET | Freq: Three times a day (TID) | ORAL | 6 refills | Status: DC | PRN

## 2023-08-11 MED ORDER — PANTOPRAZOLE SODIUM 40 MG PO TBEC: 40.0000 mg | DELAYED_RELEASE_TABLET | Freq: Two times a day (BID) | ORAL | 11 refills | Status: DC

## 2023-08-11 MED ORDER — ONDANSETRON HCL 4 MG PO TABS: 4.0000 mg | ORAL_TABLET | ORAL | 6 refills | Status: DC | PRN

## 2023-08-11 MED ORDER — PROMETHAZINE HCL 12.5 MG PO TABS: 12.5000 mg | ORAL_TABLET | Freq: Every day | ORAL | 6 refills | Status: DC | PRN

## 2023-08-11 NOTE — Progress Notes (Signed)
HISTORY OF PRESENT ILLNESS:  [name] is a 89 y.o. male, former mayor of [name], who presents today with his wife [name] and his caretaker [name] regarding a number of issues including left upper quadrant pain, anemia, Hemoccult positive stool, nausea, poor appetite, and lack of energy.  He has a very significant past medical history as documented.  Problems include, but not limited to, aortitis.  1 month hospitalization.  Interstitial lung disease for which she is on chronic oxygen therapy, polymyalgia rheumatica for which he is on low-dose prednisone, and paroxysmal atrial fibrillation for which she is on chronic anticoagulation therapy.  He is followed by his primary care provider Dr. Waynard Edwards and multiple specialist.  Recently saw his pulmonologist, Dr. Marchelle Gearing, who reached out to me.  He is also seeing hematology and infectious diseases.  I have seen [name] in the past for colonoscopy.  He has a history of multiple and advanced adenomatous colon polyps.  His last colonoscopy was performed September 26, 2018 after positive Cologuard testing.  He was found to have multiple diminutive adenomas and hemorrhoids.  Ileum was normal.  No follow-up recommended due to age and comorbidities.  It has been a long road for [employer].  He has made slow progress over time but he and his wife feel that he should be advancing at a better pace and are concerned that there are other problems (other than his multiple significant problems) that account for his issues with poor appetite and weakness.  I have been in touch with them several times in recent weeks assisting with various issues.  From my standpoint the following:  1.  We have him on a bowel regimen with various agents to achieve at least 1 bowel movement or 2/day.  This has been successful. 2.  For his nausea/decreased appetite I recommended Zofran before meals.  They are doing this for him.  His progress in this regard has been small but not negligible. 3.  For  his chronic anemia (multifactorial) and Hemoccult positive stool I recommended twice daily PPI for upper GI mucosal protection.  He is NOT a candidate for endoscopic procedures, and certainly not surgery. 4.  Review of his blood work shows slowly improving, now normal, albumin.  Also shows stable chronic anemia.  He does have a significantly persistent elevated sedimentation rate.  I wonder about giant cell arteritis. 5.  He has had left upper quadrant pain for the past several weeks.  It is vague by description.  No obvious exacerbating or relieving factors.  Often at night.  Because of this I did order a CT scan of the chest abdomen and pelvis with contrast which was completed August 03, 2023.  Multiple abnormalities, but no acute abnormalities or abnormalities otherwise to explain his left upper quadrant pain.  As well no gross abnormalities of the GI tract proper.  He did have large volume stool in his colon.  He does undergo physical therapy twice per week.  He also has a trainer come in for stretching exercises.  Does ambulate some with a walker.  His caregiver states that he is making slow but definite progress in terms of his strength.  Despite this, [name]'s chief complaint is lack of energy.  REVIEW OF SYSTEMS:  All non-GI ROS negative unless otherwise listed in HPI.  Past Medical History:  Diagnosis Date   AAA (abdominal aortic aneurysm) (HCC)    Abnormal EKG    Left atrial abnormality   Allergic rhinitis    Allergy  Benign neoplasm of colon 04/14/2010   3 small polyps on colonoscopy by Dr. Marina Goodell   Carpal tunnel syndrome    Cataract    Decubitus ulcer of coccygeal region, stage 2 (HCC) 07/06/2023   Degenerative disc disease    Disturbances metabolism of methionine, homocystine, and cystathionine (HCC)    Elevated homocysteine   Elevated prostate specific antigen (PSA)    Elevated PSA 07/06/2023   Hearing loss    Hyperlipidemia    ILD (interstitial lung disease) (HCC)  07/06/2023   Impotence of organic origin    Penile implant   Internal hemorrhoids    Leukocytosis 07/06/2023   Neck pain    Otosclerosis of both ears    Peripheral vascular disease (HCC)    Bilateral femoral bruit   Plantar fasciitis    Polymyalgia rheumatica (HCC)    Polymyalgia rheumatica (HCC) 07/06/2023   Rotator cuff syndrome of left shoulder    Scoliosis    Shoulder pain     Past Surgical History:  Procedure Laterality Date   ABDOMINAL AORTIC ENDOVASCULAR STENT GRAFT N/A 08/16/2017   Procedure: ABDOMINAL AORTIC ENDOVASCULAR STENT GRAFT insertion;  Surgeon: Nada Libman, MD;  Location: [facility] OR;  Service: Vascular;  Laterality: N/A;   Actinic keratosis removal  06/21/2010   Left shoulder; Dr. 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   Procedure: DUPUYTREN CONTRACTURE RELEASE RIGHT LONG, RING AND SMALL FINGERS;  Surgeon: Wyn Forster., MD;  Location: [facility];  Service: Orthopedics;  Laterality: Right;   Implant penile pump  2001   IR ANGIOGRAM PELVIS SELECTIVE OR SUPRASELECTIVE  12/11/2020   IR ANGIOGRAM SELECTIVE EACH ADDITIONAL VESSEL  12/11/2020   IR EMBO ARTERIAL NOT HEMORR HEMANG INC GUIDE ROADMAPPING  12/11/2020   IR RADIOLOGIST EVAL & MGMT  07/30/2020   IR US GUIDE VASC ACCESS RIGHT  12/11/2020   Resection of appendix and tip of rectum  February 2005   STAPEDECTOMY Bilateral 1985, 1988   [employer]    Social History [name]  reports that he has never smoked. He has never used smokeless tobacco. He reports that he does not drink alcohol and does not use drugs.  family history includes Emphysema in his mother; Heart disease in his mother; Leukemia in his brother.  Allergies  Allergen Reactions   Daptomycin Other (See Comments)    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   Rosuvastatin Other (See Comments)    Stopped taking due to feeling achy     Codeine Nausea And Vomiting    Morphine And Codeine Nausea And Vomiting   Omnicef [Cefdinir] Other (See Comments)    Abdominal pain       PHYSICAL EXAMINATION: Vital signs: BP 124/70   Pulse 100   Ht 6\' 1"  (1.854 m)   Wt 148 lb (67.1 kg)   BMI 19.53 kg/m   Constitutional: Chronically ill-appearing frail male in a wheelchair, no acute distress.  Dyspnea at rest Psychiatric: alert and oriented x3, cooperative Eyes: extraocular movements intact, anicteric, conjunctiva pink Mouth: oral pharynx moist, no lesions.  No thrush Neck: supple no lymphadenopathy Cardiovascular: heart regular rate and rhythm. Lungs: Few dry crackles. Abdomen: soft, nontender, nondistended, no obvious ascites, no peritoneal signs, normal bowel sounds, no organomegaly Rectal: Omitted Extremities: no clubbing or cyanosis.  Trace lower extremity edema bilaterally Skin: no lesions on visible extremities Neuro: No focal deficits.  Cranial nerves intact  ASSESSMENT: 1.  Multiple significant medical problems with significant recent illness and advanced age. 2.  Problems with poor appetite and fatigue related to the same 3.  History of constipation. 4.  History of colon polyps 5.  Chronic anemia 6.  Elevated sedimentation rate 7.  Heme positive stool 8.  Chronic anticoagulation 9.  Advanced lung disease on chronic oxygen  PLAN:  1.  We have him on a bowel regimen with various agents to achieve at least 1 bowel movement or 2/day.  This has been successful.  Continue 2.  For his nausea/decreased appetite I recommended Zofran before meals.  They are doing this for him.  His progress in this regard has been small but not negligible.  He has also been using low-dose Phenergan (12.5 mg) less than once per day.  A prescription has been requested.  Prefer oral over suppositories.  Prescribed.  Advised with regards to side effects, particular confusion and somnolence.  They understand.  Zofran also refilled. 3.  For his chronic anemia (multifactorial)  and Hemoccult positive stool I recommended twice daily PPI for upper GI mucosal protection.  He is NOT a candidate for endoscopic procedures, and certainly not surgery.  Pantoprazole 40 mg twice daily refilled. 4.  Review of his blood work shows slowly improving, now normal, albumin.  Also shows stable chronic anemia.  He does have a significantly persistent elevated sedimentation rate.  I wonder about giant cell arteritis.  There is seeing Dr. Evlyn Kanner.  He may consider increased steroid, I am told.  This may be good.  Appropriate physicians to monitor. 5.  He has had left upper quadrant pain for the past several weeks.  It is vague by description.  No obvious exacerbating or relieving factors.  Often at night.  Because of this I did order a CT scan of the chest abdomen and pelvis with contrast which was completed August 03, 2023.  Multiple abnormalities, but no acute abnormalities or abnormalities otherwise to explain his left upper quadrant pain.  As well no gross abnormalities of the GI tract proper.  He did have large volume stool in his colon.  Continue to monitor 6.  I commended all in the room today for the hard work today are doing regarding [name]'s health.  Continue 7.  I am accessible to them personally through my mobile phone in the office.  They understand and are comfortable reaching out as needed. A total time of 60 minutes with more was spent preparing to see the patient, obtaining comprehensive history, performing medically appropriate physical examination, counseling and educating the patient regarding the above listed issues, ordering multiple medications, and documenting clinical information in the health record.

## 2023-08-11 NOTE — Addendum Note (Signed)
Addended by: Dennison Mascot on: 08/11/2023 03:08 PM   Modules accepted: Orders

## 2023-08-11 NOTE — Patient Instructions (Addendum)
We have sent the following medications to your pharmacy for you to pick up at your convenience: Zofran, Phenergan, Pantoprazole      If your blood pressure at your visit was 140/90 or greater, please contact your primary care physician to follow up on this.  _______________________________________________________  If you are age 87 or older, your body mass index should be between 23-30. Your Body mass index is 19.53 kg/m. If this is out of the aforementioned range listed, please consider follow up with your Primary Care Provider.  If you are age 64 or younger, your body mass index should be between 19-25. Your Body mass index is 19.53 kg/m. If this is out of the aformentioned range listed, please consider follow up with your Primary Care Provider.   ________________________________________________________  The [facility] providers would like to encourage you to use [facility] to communicate with providers for non-urgent requests or questions.  Due to long hold times on the telephone, sending your provider a message by [facility] may be a faster and more efficient way to get a response.  Please allow 48 business hours for a response.  Please remember that this is for non-urgent requests.  _______________________________________________________    Thank you for entrusting me with your care and choosing [facility]  Dr Hilarie Fredrickson

## 2023-08-15 NOTE — Progress Notes (Signed)
Chief Complaint: Patient was seen in virtual consultation today for benign prostatic hyperplasia with lower urinary tract symptoms  Referring Physician(s): Bell,Eugene D III  History of Present Illness: [name] is a 89 y.o. male with a medical history significant for polymyalgia rheumatica on chronic prednisone, scoliosis, diastolic heart failure, paroxysmal atrial fibrillation/flutter, PE/DVT (Eliquis), interstitial lung disease (home oxygen) chronic leukocytosis and abdominal aortic aneurysm status post endovascular repair. He is familiar to IR from treatment of a persistent type II endoleak in 2022.  He has been hospitalized several times this year for issues related to generalized weakness and acute respiratory failure.  [name] also has a history of BPH with lower urinary tract symptoms and his been followed by Urology for many years. He has been on tamsulosin with escalating dosages and variable effect of his LUTS. He has mostly struggled with nocturia, sometimes needing to void 3-4 times per night. He underwent a CTA of the abdomen 05/11/23 which showed severe prostatomegaly and a markedly distended bladder. PSA 07/03/23 was 6.85. He has failed multiple voiding trials over this past year.   He was referred to our team by his Urologist Dr. Alvester Morin to discuss prostate artery embolization. Given his age and co-morbidities, [name] is not considered a good candidate for surgical intervention.   Past Medical History:  Diagnosis Date   AAA (abdominal aortic aneurysm) (HCC)    Abnormal EKG    Left atrial abnormality   Allergic rhinitis    Allergy    Benign neoplasm of colon 04/14/2010   3 small polyps on colonoscopy by Dr. Marina Goodell   Carpal tunnel syndrome    Cataract    Decubitus ulcer of coccygeal region, stage 2 (HCC) 07/06/2023   Degenerative disc disease    Disturbances metabolism of methionine, homocystine, and cystathionine (HCC)    Elevated homocysteine   Elevated  prostate specific antigen (PSA)    Elevated PSA 07/06/2023   Hearing loss    Hyperlipidemia    ILD (interstitial lung disease) (HCC) 07/06/2023   Impotence of organic origin    Penile implant   Internal hemorrhoids    Leukocytosis 07/06/2023   Neck pain    Otosclerosis of both ears    Peripheral vascular disease (HCC)    Bilateral femoral bruit   Plantar fasciitis    Polymyalgia rheumatica (HCC)    Polymyalgia rheumatica (HCC) 07/06/2023   Rotator cuff syndrome of left shoulder    Scoliosis    Shoulder pain     Past Surgical History:  Procedure Laterality Date   ABDOMINAL AORTIC ENDOVASCULAR STENT GRAFT N/A 08/16/2017   Procedure: ABDOMINAL AORTIC ENDOVASCULAR STENT GRAFT insertion;  Surgeon: Nada Libman, MD;  Location: [facility] OR;  Service: Vascular;  Laterality: N/A;   Actinic keratosis removal  06/21/2010   Left shoulder; Dr. 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   Procedure: DUPUYTREN CONTRACTURE RELEASE RIGHT LONG, RING AND SMALL FINGERS;  Surgeon: Wyn Forster., MD;  Location: [facility];  Service: Orthopedics;  Laterality: Right;   Implant penile pump  2001   IR ANGIOGRAM PELVIS SELECTIVE OR SUPRASELECTIVE  12/11/2020   IR ANGIOGRAM SELECTIVE EACH ADDITIONAL VESSEL  12/11/2020   IR EMBO ARTERIAL NOT HEMORR HEMANG INC GUIDE ROADMAPPING  12/11/2020   IR RADIOLOGIST EVAL & MGMT  07/30/2020   IR US GUIDE VASC ACCESS RIGHT  12/11/2020   Resection of appendix and tip  of rectum  February 2005   STAPEDECTOMY Bilateral 1985, 1988   [employer]    Allergies: Daptomycin, Rosuvastatin, Codeine, Morphine and codeine, and Omnicef [cefdinir]  Medications: Prior to Admission medications   Medication Sig Start Date End Date Taking? Authorizing Provider  acetaminophen (TYLENOL) 325 MG tablet Take 2 tablets (650 mg total) by mouth every 6 (six) hours as needed for mild pain (or Fever >/=  101). Patient not taking: Reported on 08/11/2023 03/23/23   Rodolph Bong, MD  apixaban (ELIQUIS) 2.5 MG TABS tablet Take 2.5 mg by mouth 2 (two) times daily. 05/09/23   [provider]  bisacodyl 5 MG EC tablet Take 10 mg by mouth daily as needed for moderate constipation.    [provider]  butalbital-acetaminophen-caffeine (FIORICET) [phone] MG tablet Take 1 tablet by mouth daily as needed for headache. 03/23/23   Rodolph Bong, MD  cholecalciferol (VITAMIN D3) 25 MCG (1000 UNIT) tablet Take 1,000 Units by mouth daily.    [provider]  diclofenac Sodium (VOLTAREN) 1 % GEL Apply 2 g topically 4 (four) times daily as needed (shoulder pain). 03/23/23   Rodolph Bong, MD  docusate sodium (COLACE) 100 MG capsule Take 100 mg by mouth 2 (two) times daily.    [provider]  doxycycline (VIBRAMYCIN) 100 MG capsule Take 100 mg by mouth 2 (two) times daily. Patient not taking: Reported on 08/11/2023 06/06/23   [provider]  feeding supplement (ENSURE ENLIVE / ENSURE PLUS) LIQD Take 237 mLs by mouth 3 (three) times daily between meals. 03/23/23   Rodolph Bong, MD  ferrous sulfate 324 MG TBEC Take 324 mg by mouth daily. As needed Patient not taking: Reported on 08/11/2023    [provider]  finasteride (PROSCAR) 5 MG tablet Take 5 mg by mouth daily. 05/17/23   [provider]  fish oil-omega-3 fatty acids 1000 MG capsule Take 1,000 mg by mouth daily. Patient not taking: Reported on 08/11/2023    [provider]  fluorometholone (FML) 0.1 % ophthalmic suspension Place 1 drop into the right eye at bedtime.     [provider]  L-Methylfolate-B12-B6-B2 (METAFOLBIC) 04-28-49-5 MG TABS TAKE ONE TABLET TWICE DAILY Patient taking differently: Take 1 tablet by mouth 2 (two) times daily. 08/18/17   Sharon Seller, NP  LORazepam (ATIVAN) 0.5 MG tablet Take 0.5 mg by mouth daily. 0.25 am, 0.25 pm,  0.5 at night  04/01/23   [provider]  magnesium hydroxide (MILK OF MAGNESIA) 400 MG/5ML suspension Take by mouth daily as needed for mild constipation.    [provider]  melatonin 3 MG TABS tablet Take 1 tablet (3 mg total) by mouth at bedtime. Patient taking differently: Take 5 mg by mouth as needed. 03/23/23   Rodolph Bong, MD  metoprolol succinate (TOPROL XL) 25 MG 24 hr tablet Take 1 tablet (25 mg total) by mouth daily. 05/31/23   Little Ishikawa, MD  mirtazapine (REMERON) 15 MG tablet Take 15 mg by mouth at bedtime. Pt taking 30 mg    [provider]  Multiple Vitamins-Minerals (MULTIVITAMIN PO) Take 1 tablet by mouth daily.     [provider]  ondansetron (ZOFRAN) 4 MG tablet Take 1 tablet (4 mg total) by mouth every 4 (four) hours as needed for nausea or vomiting. 08/11/23   Hilarie Fredrickson, MD  pantoprazole (PROTONIX) 40 MG tablet Take 1 tablet (40 mg total) by mouth 2 (two) times daily  before a meal. 08/11/23   Hilarie Fredrickson, MD  polyethylene glycol (MIRALAX / GLYCOLAX) 17 g packet Take 17 g by mouth 2 (two) times daily.    [provider]  predniSONE (DELTASONE) 20 MG tablet Take 2 tablets (40 mg total) by mouth daily with breakfast. Patient taking differently: Take 10 mg by mouth daily with breakfast. 15mg  daily 03/24/23   Rodolph Bong, MD  promethazine (PHENERGAN) 12.5 MG tablet Take 1 tablet (12.5 mg total) by mouth daily as needed for nausea or vomiting. 08/11/23   Hilarie Fredrickson, MD  psyllium (REGULOID) 0.52 g capsule Take 0.52 g by mouth daily.    [provider]  senna-docusate (SENOKOT-S) 8.6-50 MG tablet Take 1 tablet by mouth 2 (two) times daily. Patient not taking: Reported on 08/11/2023    [provider]  tamsulosin (FLOMAX) 0.4 MG CAPS capsule Take 0.4 mg by mouth at bedtime. 06/10/20   [provider]  Tart Cherry 1200 MG CAPS Take 1 tablet by mouth daily.    [provider]  traMADol (ULTRAM)  50 MG tablet Take 50 mg by mouth every 4 (four) hours as needed for moderate pain or severe pain. Patient not taking: Reported on 08/11/2023    [provider]  zolpidem (AMBIEN) 10 MG tablet Take 5 mg by mouth at bedtime as needed for sleep.    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   Social Determinants of Health   Financial Resource Strain: Low Risk  (03/07/2023)   Overall Financial Resource Strain (CARDIA)    Difficulty of Paying Living Expenses: Not hard at all  Food Insecurity: No Food Insecurity (03/15/2023)   Hunger Vital Sign    Worried About Running Out of Food in the Last Year: Never true    Ran Out of Food in the Last Year: Never true  Transportation Needs: No Transportation Needs (03/15/2023)   PRAPARE - Administrator, Civil Service (Medical): No    Lack of Transportation (Non-Medical): No  Physical Activity: Not on file  Stress: Not on file  Social Connections: Not on file    Review of Systems: A  12 point ROS discussed and pertinent positives are indicated in the HPI above.  All other systems are negative.  Review of Systems  Vital Signs: There were no vitals taken for this visit.  Advance Care Plan: The advanced care plan/surrogate decision maker was discussed at the time of visit and documented in the medical record.    No physical exam was performed in lieu of virtual telephone visit.   Imaging: CT CHEST ABDOMEN PELVIS W CONTRAST  Result Date: 08/03/2023 CLINICAL DATA:  Worsening left-sided abdominal pain EXAM: CT CHEST, ABDOMEN, AND PELVIS WITH CONTRAST TECHNIQUE: Multidetector CT imaging of the chest, abdomen and pelvis was performed following the standard protocol during bolus administration of intravenous contrast. RADIATION DOSE REDUCTION: This exam was performed according to the departmental dose-optimization program which includes automated exposure control, adjustment of the mA and/or kV according to patient size and/or use of iterative reconstruction technique. CONTRAST:  OMNIPAQUE IOHEXOL 300 MG/ML  SOLN COMPARISON:  CT 07/05/2023, 06/16/2023, and multiple prior exams dating back to 2018 FINDINGS: CT CHEST FINDINGS Cardiovascular: Moderate aortic atherosclerosis. Diffuse aneurysmal dilatation of the ascending aorta, arch and proximal descending thoracic aorta. Ascending aortic diameter of 4.6 cm, previously 4.6 cm. Aortic arch diameter of 4.3 cm compared with 4.1 cm previously. Aneurysmal dilatation of the proximal descending thoracic aorta measuring up to 5 cm, previously 5.1 cm. Normal cardiac size. No pericardial effusion. Mediastinum/Nodes: Midline trachea. No thyroid mass. No suspicious lymph nodes. Esophagus within normal limits. Lungs/Pleura: No acute airspace disease. Mild bronchiectasis in the right apex and bilateral lower lobes. Subpleural reticulation and mild fibrosis. No acute airspace disease, pleural effusion, or pneumothorax. Musculoskeletal: Sternum appears  intact. No acute osseous abnormality. CT ABDOMEN PELVIS FINDINGS Hepatobiliary: Multiple hepatic cysts. No calcified gallstone or biliary dilatation Pancreas: Unremarkable. No pancreatic ductal dilatation or surrounding inflammatory changes. Spleen: Normal in size without focal abnormality. Adrenals/Urinary Tract: Adrenal glands are within normal limits. Kidneys show no hydronephrosis. Cyst in the right kidney, no imaging follow-up is recommended. The bladder is thick walled but decompressed by Foley catheter. Stomach/Bowel: Stomach nonenlarged. No dilated small bowel. No acute bowel wall thickening. Large volume stool throughout the colon. Vascular/Lymphatic: Patient is status post endovascular repair of infrarenal abdominal aortic aneurysm. Globular contrast within the posterior sac again demonstrated corresponding to history of type 2 endoleak. Excluded aneurysm sac measures 7 x 5.8 cm on series 301 image 90 and is grossly stable in size compared with most recent priors. Negative for retroperitoneal hematoma or stranding to suggest leakage. Hyperdense embolization material within the posteroinferior sac with artifact. Imaged portions of the stent appear patent. No suspicious lymph nodes. Reproductive: Penile prostheses. Decompressed reservoir or in the right anterior pelvis. Prostate slightly enlarged. Other: Negative for pelvic effusion or free air. Musculoskeletal: No acute or suspicious osseous abnormality. Multilevel degenerative changes. IMPRESSION: 1. Negative for acute airspace disease. 2. Diffuse aneurysmal dilatation of the ascending thoracic aorta, aortic arch and proximal descending thoracic aorta, grossly stable compared with most recent priors. 3. Status post endovascular repair of infrarenal abdominal aortic aneurysm. Globular contrast within the posterior sac again demonstrated corresponding to history of type 2 endoleak. Excluded aneurysm sac is grossly stable in size compared with most recent  priors. 4. Large volume stool throughout the colon, possible constipation. No evidence for bowel inflammatory process on this exam. 5. Thick-walled bladder decompressed by Foley catheter. 6. Aortic atherosclerosis. Aortic Atherosclerosis (ICD10-I70.0). Electronically Signed   By: Jasmine Pang M.D.   On: 08/03/2023 19:41    Labs:  CBC: Recent Labs    06/14/23 1217 07/03/23 0949 07/05/23 1028 08/08/23 1224  WBC 14.7* 20.5 Repeated and verified  X2.* 17.4* 14.7*  HGB 8.1 Repeated and verified X2.* 9.9* 9.4* 10.4*  HCT 26.0 Repeated and verified X2.* 31.9* 29.8* 33.9*  PLT 584.0* 566.0* 479* 425.0*    COAGS: No results for input(s): "INR", "APTT" in the last 8760 hours.  BMP: Recent Labs    03/19/23 1130 03/22/23 1000 03/23/23 0315 06/14/23 1217 07/03/23 0949 07/05/23 1028 08/08/23 1224  NA 139 137 135 136 139 138 141  K 4.2 3.7 4.1 4.8 4.5 4.2 4.3  CL 101 100 98 101 99 101 104  CO2 29 30 28 27 29 30 30   GLUCOSE 161* 98 95 107* 121* 119* 117*  BUN 34* 29* 29* 34* 23 23 37*  CALCIUM 8.5* 8.4* 8.3* 8.9 9.2 9.2 10.0  CREATININE 0.80 0.80 0.87 1.59* 0.97 0.94 0.91  GFRNONAA >60 >60 >60  --   --  >60  --     LIVER FUNCTION TESTS: Recent Labs    03/11/23 0419 03/14/23 0417 06/14/23 1217 07/05/23 1028 08/08/23 1224  BILITOT 0.8  --  0.3 0.3 0.4  AST 23  --  20 22 18   ALT 39  --  15 25 15   ALKPHOS 78  --  77 80 78  PROT 5.6*  --  6.5 6.8 7.3  ALBUMIN 2.5* 2.2* 3.3* 3.3* 3.5    TUMOR MARKERS: No results for input(s): "AFPTM", "CEA", "CA199", "CHROMGRNA" in the last 8760 hours.  Assessment and Plan:  89 year old male with benign prostatic hyperplasia and lower urinary tract symptoms.   Thank you for this interesting consult.  I greatly enjoyed meeting MAISEN PEARSALL and look forward to participating in their care.  A copy of this report was sent to the requesting provider on this date.  Electronically Signed: Mickie Kay, NP 08/15/2023, 1:18 PM   I spent  a total of 40 Minutes    in virtual clinical consultation, greater than 50% of which was counseling/coordinating care for BPH with lower urinary tract symptoms.

## 2023-08-16 ENCOUNTER — Ambulatory Visit
Admission: RE | Admit: 2023-08-16 | Discharge: 2023-08-16 | Disposition: A | Discharge status: Home / Self Care | Payer: Medicare Other | Source: Ambulatory Visit | Attending: Urology | Admitting: Urology

## 2023-08-16 DIAGNOSIS — N401 Enlarged prostate with lower urinary tract symptoms: Secondary | ICD-10-CM | POA: Diagnosis not present

## 2023-08-16 DIAGNOSIS — N400 Enlarged prostate without lower urinary tract symptoms: Secondary | ICD-10-CM

## 2023-08-16 HISTORY — PX: IR RADIOLOGIST EVAL & MGMT: IMG5224

## 2023-08-17 DIAGNOSIS — M545 Low back pain, unspecified: Secondary | ICD-10-CM | POA: Diagnosis not present

## 2023-08-17 DIAGNOSIS — R5382 Chronic fatigue, unspecified: Secondary | ICD-10-CM | POA: Diagnosis not present

## 2023-08-17 DIAGNOSIS — R269 Unspecified abnormalities of gait and mobility: Secondary | ICD-10-CM | POA: Diagnosis not present

## 2023-08-17 DIAGNOSIS — M79605 Pain in left leg: Secondary | ICD-10-CM | POA: Diagnosis not present

## 2023-08-18 ENCOUNTER — Encounter (HOSPITAL_BASED_OUTPATIENT_CLINIC_OR_DEPARTMENT_OTHER): Payer: Medicare Other | Admitting: General Surgery

## 2023-08-18 DIAGNOSIS — H348192 Central retinal vein occlusion, unspecified eye, stable: Secondary | ICD-10-CM | POA: Diagnosis not present

## 2023-08-18 DIAGNOSIS — Z86711 Personal history of pulmonary embolism: Secondary | ICD-10-CM | POA: Diagnosis not present

## 2023-08-18 DIAGNOSIS — Z9981 Dependence on supplemental oxygen: Secondary | ICD-10-CM | POA: Diagnosis not present

## 2023-08-18 DIAGNOSIS — I509 Heart failure, unspecified: Secondary | ICD-10-CM | POA: Diagnosis not present

## 2023-08-18 DIAGNOSIS — M353 Polymyalgia rheumatica: Secondary | ICD-10-CM | POA: Diagnosis not present

## 2023-08-18 DIAGNOSIS — L89153 Pressure ulcer of sacral region, stage 3: Secondary | ICD-10-CM | POA: Diagnosis not present

## 2023-08-18 NOTE — Progress Notes (Signed)
Alginate Dressing, 4x4.75 (in/in) Every Other Day/30 Days ary Discharge Instructions: Apply to wound bed as instructed Secondary Dressing: Zetuvit Plus Silicone Border Sacrum Dressing, Sm, 7x7 (in/in) Every Other Day/30 Days Discharge Instructions: Apply silicone border over primary dressing as directed. 08/18/2023: The initial ulcer is actually healed, but there is a little bit of dry skin that peeled away leaving a very small superficial opening just lateral to the midline. No debridement was necessary today. Continue silver alginate with a foam padded dressing. Continue offloading. Follow-up in 2 weeks, at which time he is almost certain to be healed. Electronic Signature(s) Signed: 08/18/2023 11:28:29 AM By: Duanne Guess MD FACS Entered By: Duanne Guess on 08/18/2023 08:28:29 -------------------------------------------------------------------------------- HxROS Details Patient Name: Date of Service: [name], Pranav S. 08/18/2023 10:45 A M Medical Record Number: [id number] Patient  Account Number: [id number] XYION, VIRDEN ([id number]) [id number] Page 6 of 7 Date of Birth/Sex: Treating RN: 25-Jan-1930 (89 y.o. M) Primary Care Provider: Other Clinician: Ezequiel Kayser Referring Provider: Treating Provider/Extender: Dorthea Cove in Treatment: 5 Information Obtained From Patient Chart Eyes Medical History: Positive for: Cataracts - bil extractions Negative for: Glaucoma; Optic Neuritis Past Medical History Notes: retinal vein occlusion Ear/Nose/Mouth/Throat Medical History: Negative for: Chronic sinus problems/congestion; Middle ear problems Past Medical History Notes: seasonal allergies Hematologic/Lymphatic Medical History: Positive for: Anemia Negative for: Hemophilia; Human Immunodeficiency Virus; Lymphedema; Sickle Cell Disease Respiratory Medical History: Negative for: Aspiration; Asthma; Chronic Obstructive Pulmonary Disease (COPD); Pneumothorax; Sleep Apnea; Tuberculosis Past Medical History Notes: interstitial lung disease, O2 dependent, hx PE Cardiovascular Medical History: Positive for: Congestive Heart Failure; Deep Vein Thrombosis Negative for: Arrhythmia Past Medical History Notes: AAA, hyperlipidemia, aortitis, hx PNA, eosinophic pneumonia Endocrine Medical History: Negative for: Type I Diabetes; Type II Diabetes Genitourinary Medical History: Negative for: End Stage Renal Disease Past Medical History Notes: BPH, foley cath Integumentary (Skin) Medical History: Negative for: History of Burn Musculoskeletal Medical History: Positive for: Osteoarthritis Negative for: Gout; Rheumatoid Arthritis; Osteomyelitis Past Medical History Notes: polymyalgia rheumatica, degenerative disk dx, dupuytren's contracture Oncologic Medical History: Negative for: Received Chemotherapy; Received Radiation Psychiatric Medical History: Negative for: Lanney Gins Anxiety Henslee,  Quillan S ([id number]) [id number] Page 7 of 7 HBO Extended History Items Eyes: Cataracts Immunizations Pneumococcal Vaccine: Received Pneumococcal Vaccination: Yes Received Pneumococcal Vaccination On or After 60th Birthday: Yes Implantable Devices None Family and Social History Cancer: Yes - Siblings; Diabetes: No; Heart Disease: Yes - Mother; Hereditary Spherocytosis: No; Hypertension: No; Kidney Disease: No; Lung Disease: No; Seizures: No; Stroke: No; Thyroid Problems: No; Tuberculosis: No; Never smoker; Marital Status - Married; Alcohol Use: Rarely; Drug Use: No History; Caffeine Use: Rarely; Financial Concerns: No; Food, Clothing or Shelter Needs: No; Support System Lacking: No; Transportation Concerns: No Electronic Signature(s) Signed: 08/18/2023 11:30:17 AM By: Duanne Guess MD FACS Entered By: Duanne Guess on 08/18/2023 08:27:00 -------------------------------------------------------------------------------- SuperBill Details Patient Name: Date of Service: [name], Cataldo S. 08/18/2023 Medical Record Number: [id number] Patient Account Number: [id number] Date of Birth/Sex: Treating RN: 03/13/1930 (89 y.o. [name] Primary Care Provider: Ezequiel Kayser Other Clinician: Referring Provider: Treating Provider/Extender: Dorthea Cove in Treatment: 5 Diagnosis Coding ICD-10 Codes Code Description 7257918361 Pressure ulcer of sacral region, stage 3 Z79.52 Long term (current) use of systemic steroids M35.3 Polymyalgia rheumatica Facility Procedures : CPT4 Code: 25956387 Description: 99213 - WOUND CARE VISIT-LEV 3 EST PT Modifier: Quantity: 1 Physician Procedures : CPT4 Code Description Modifier [id number] 99213 - WC PHYS LEVEL 3 -  of Birth/Sex: Treating RN: Apr 30, 1930 (89 y.o. [name] Primary Care Provider: Ezequiel Kayser Other Clinician: Referring Provider: Treating Provider/Extender: Dorthea Cove in Treatment: 5 Verbal / Phone Orders: No Diagnosis Coding ICD-10 Coding Code Description L89.153 Pressure ulcer of sacral region, stage 3 Z79.52 Long term (current) use of systemic steroids M35.3 Polymyalgia rheumatica Follow-up Appointments ppointment in 2 weeks. - Dr. Lady Gary RM 1 Return A Anesthetic (In clinic) Topical Lidocaine 4% applied to wound bed Bathing/ Shower/ Hygiene May shower and wash wound with soap and water. Off-Loading Other: - air mattress, avoid laying on back side, stand at least every hour during the day Additional Orders / Instructions Follow Nutritious Diet - 70-100 gms of protein daily Home Health New wound care orders this week; continue Home Health for wound care. May utilize formulary equivalent dressing for wound treatment orders unless otherwise specified. Other Home Health Orders/Instructions: Yusha Wiess, Wenzel S ([id number]) [id number] Page 3 of 7 Wound Treatment Wound #1 - Sacrum Prim Dressing: Maxorb Extra Ag+ Alginate Dressing, 4x4.75 (in/in) Every Other Day/30 Days ary Discharge Instructions: Apply to wound bed as instructed Secondary Dressing: Zetuvit Plus Silicone Border Sacrum Dressing, Sm, 7x7 (in/in) Every Other Day/30 Days Discharge Instructions: Apply silicone border over primary dressing as directed. Electronic Signature(s) Signed: 08/18/2023 11:30:17 AM By: Duanne Guess MD FACS Entered By: Duanne Guess on 08/18/2023  08:27:51 -------------------------------------------------------------------------------- Problem List Details Patient Name: Date of Service: [name], Tykel S. 08/18/2023 10:45 A M Medical Record Number: [id number] Patient Account Number: [id number] Date of Birth/Sex: Treating RN: 1930-07-09 (89 y.o. M) Primary Care Provider: Ezequiel Kayser Other Clinician: Referring Provider: Treating Provider/Extender: Dorthea Cove in Treatment: 5 Active Problems ICD-10 Encounter Code Description Active Date MDM Diagnosis L89.153 Pressure ulcer of sacral region, stage 3 07/12/2023 No Yes Z79.52 Long term (current) use of systemic steroids 07/12/2023 No Yes M35.3 Polymyalgia rheumatica 07/12/2023 No Yes Inactive Problems Resolved Problems Electronic Signature(s) Signed: 08/18/2023 11:26:00 AM By: Duanne Guess MD FACS Entered By: Duanne Guess on 08/18/2023 08:26:00 -------------------------------------------------------------------------------- Progress Note Details Patient Name: Date of Service: [name], Keimari S. 08/18/2023 10:45 A M Medical Record Number: [id number] Patient Account Number: [id number] Date of Birth/Sex: Treating RN: Aug 13, 1930 (89 y.o. M) Primary Care Provider: Ezequiel Kayser Other Clinician: Referring Provider: Treating Provider/Extender: Dorthea Cove in Treatment: 5 Schoenfeld, Cordarro S ([id number]) [id number] Page 4 of 7 Subjective Chief Complaint Information obtained from Patient Patient is at the clinic for treatment of an open pressure ulcer History of Present Illness (HPI) ADMISSION 07/12/2023 This is a 89 year old nondiabetic with a history of polymyalgia rheumatica on chronic steroid therapy. He has had a number of recent hospitalizations for various reasons that resulted in debility. After his most recent hospital stay, he was discharged to a skilled nursing facility and returned home  about a month ago. One of the issues that multiple specialists have been investigating and has been an elevated sed rate and CRP, along with chronic elevation of his white blood cell count. No obvious infectious etiology appears to have been identified. Due to his limited mobility, he is spent a fair amount of time sitting and subsequently developed a stage III sacral pressure ulcer. He does have a home health nurse/aide and his wife is very active in his care. They have been applying hydrogel and a foam border dressing to the site. He has an air mattress and his wife has been encouraging him to  Alginate Dressing, 4x4.75 (in/in) Every Other Day/30 Days ary Discharge Instructions: Apply to wound bed as instructed Secondary Dressing: Zetuvit Plus Silicone Border Sacrum Dressing, Sm, 7x7 (in/in) Every Other Day/30 Days Discharge Instructions: Apply silicone border over primary dressing as directed. 08/18/2023: The initial ulcer is actually healed, but there is a little bit of dry skin that peeled away leaving a very small superficial opening just lateral to the midline. No debridement was necessary today. Continue silver alginate with a foam padded dressing. Continue offloading. Follow-up in 2 weeks, at which time he is almost certain to be healed. Electronic Signature(s) Signed: 08/18/2023 11:28:29 AM By: Duanne Guess MD FACS Entered By: Duanne Guess on 08/18/2023 08:28:29 -------------------------------------------------------------------------------- HxROS Details Patient Name: Date of Service: [name], Pranav S. 08/18/2023 10:45 A M Medical Record Number: [id number] Patient  Account Number: [id number] XYION, VIRDEN ([id number]) [id number] Page 6 of 7 Date of Birth/Sex: Treating RN: 25-Jan-1930 (89 y.o. M) Primary Care Provider: Other Clinician: Ezequiel Kayser Referring Provider: Treating Provider/Extender: Dorthea Cove in Treatment: 5 Information Obtained From Patient Chart Eyes Medical History: Positive for: Cataracts - bil extractions Negative for: Glaucoma; Optic Neuritis Past Medical History Notes: retinal vein occlusion Ear/Nose/Mouth/Throat Medical History: Negative for: Chronic sinus problems/congestion; Middle ear problems Past Medical History Notes: seasonal allergies Hematologic/Lymphatic Medical History: Positive for: Anemia Negative for: Hemophilia; Human Immunodeficiency Virus; Lymphedema; Sickle Cell Disease Respiratory Medical History: Negative for: Aspiration; Asthma; Chronic Obstructive Pulmonary Disease (COPD); Pneumothorax; Sleep Apnea; Tuberculosis Past Medical History Notes: interstitial lung disease, O2 dependent, hx PE Cardiovascular Medical History: Positive for: Congestive Heart Failure; Deep Vein Thrombosis Negative for: Arrhythmia Past Medical History Notes: AAA, hyperlipidemia, aortitis, hx PNA, eosinophic pneumonia Endocrine Medical History: Negative for: Type I Diabetes; Type II Diabetes Genitourinary Medical History: Negative for: End Stage Renal Disease Past Medical History Notes: BPH, foley cath Integumentary (Skin) Medical History: Negative for: History of Burn Musculoskeletal Medical History: Positive for: Osteoarthritis Negative for: Gout; Rheumatoid Arthritis; Osteomyelitis Past Medical History Notes: polymyalgia rheumatica, degenerative disk dx, dupuytren's contracture Oncologic Medical History: Negative for: Received Chemotherapy; Received Radiation Psychiatric Medical History: Negative for: Lanney Gins Anxiety Henslee,  Quillan S ([id number]) [id number] Page 7 of 7 HBO Extended History Items Eyes: Cataracts Immunizations Pneumococcal Vaccine: Received Pneumococcal Vaccination: Yes Received Pneumococcal Vaccination On or After 60th Birthday: Yes Implantable Devices None Family and Social History Cancer: Yes - Siblings; Diabetes: No; Heart Disease: Yes - Mother; Hereditary Spherocytosis: No; Hypertension: No; Kidney Disease: No; Lung Disease: No; Seizures: No; Stroke: No; Thyroid Problems: No; Tuberculosis: No; Never smoker; Marital Status - Married; Alcohol Use: Rarely; Drug Use: No History; Caffeine Use: Rarely; Financial Concerns: No; Food, Clothing or Shelter Needs: No; Support System Lacking: No; Transportation Concerns: No Electronic Signature(s) Signed: 08/18/2023 11:30:17 AM By: Duanne Guess MD FACS Entered By: Duanne Guess on 08/18/2023 08:27:00 -------------------------------------------------------------------------------- SuperBill Details Patient Name: Date of Service: [name], Cataldo S. 08/18/2023 Medical Record Number: [id number] Patient Account Number: [id number] Date of Birth/Sex: Treating RN: 03/13/1930 (89 y.o. [name] Primary Care Provider: Ezequiel Kayser Other Clinician: Referring Provider: Treating Provider/Extender: Dorthea Cove in Treatment: 5 Diagnosis Coding ICD-10 Codes Code Description 7257918361 Pressure ulcer of sacral region, stage 3 Z79.52 Long term (current) use of systemic steroids M35.3 Polymyalgia rheumatica Facility Procedures : CPT4 Code: 25956387 Description: 99213 - WOUND CARE VISIT-LEV 3 EST PT Modifier: Quantity: 1 Physician Procedures : CPT4 Code Description Modifier [id number] 99213 - WC PHYS LEVEL 3 -  of Birth/Sex: Treating RN: Apr 30, 1930 (89 y.o. [name] Primary Care Provider: Ezequiel Kayser Other Clinician: Referring Provider: Treating Provider/Extender: Dorthea Cove in Treatment: 5 Verbal / Phone Orders: No Diagnosis Coding ICD-10 Coding Code Description L89.153 Pressure ulcer of sacral region, stage 3 Z79.52 Long term (current) use of systemic steroids M35.3 Polymyalgia rheumatica Follow-up Appointments ppointment in 2 weeks. - Dr. Lady Gary RM 1 Return A Anesthetic (In clinic) Topical Lidocaine 4% applied to wound bed Bathing/ Shower/ Hygiene May shower and wash wound with soap and water. Off-Loading Other: - air mattress, avoid laying on back side, stand at least every hour during the day Additional Orders / Instructions Follow Nutritious Diet - 70-100 gms of protein daily Home Health New wound care orders this week; continue Home Health for wound care. May utilize formulary equivalent dressing for wound treatment orders unless otherwise specified. Other Home Health Orders/Instructions: Yusha Wiess, Wenzel S ([id number]) [id number] Page 3 of 7 Wound Treatment Wound #1 - Sacrum Prim Dressing: Maxorb Extra Ag+ Alginate Dressing, 4x4.75 (in/in) Every Other Day/30 Days ary Discharge Instructions: Apply to wound bed as instructed Secondary Dressing: Zetuvit Plus Silicone Border Sacrum Dressing, Sm, 7x7 (in/in) Every Other Day/30 Days Discharge Instructions: Apply silicone border over primary dressing as directed. Electronic Signature(s) Signed: 08/18/2023 11:30:17 AM By: Duanne Guess MD FACS Entered By: Duanne Guess on 08/18/2023  08:27:51 -------------------------------------------------------------------------------- Problem List Details Patient Name: Date of Service: [name], Tykel S. 08/18/2023 10:45 A M Medical Record Number: [id number] Patient Account Number: [id number] Date of Birth/Sex: Treating RN: 1930-07-09 (89 y.o. M) Primary Care Provider: Ezequiel Kayser Other Clinician: Referring Provider: Treating Provider/Extender: Dorthea Cove in Treatment: 5 Active Problems ICD-10 Encounter Code Description Active Date MDM Diagnosis L89.153 Pressure ulcer of sacral region, stage 3 07/12/2023 No Yes Z79.52 Long term (current) use of systemic steroids 07/12/2023 No Yes M35.3 Polymyalgia rheumatica 07/12/2023 No Yes Inactive Problems Resolved Problems Electronic Signature(s) Signed: 08/18/2023 11:26:00 AM By: Duanne Guess MD FACS Entered By: Duanne Guess on 08/18/2023 08:26:00 -------------------------------------------------------------------------------- Progress Note Details Patient Name: Date of Service: [name], Keimari S. 08/18/2023 10:45 A M Medical Record Number: [id number] Patient Account Number: [id number] Date of Birth/Sex: Treating RN: Aug 13, 1930 (89 y.o. M) Primary Care Provider: Ezequiel Kayser Other Clinician: Referring Provider: Treating Provider/Extender: Dorthea Cove in Treatment: 5 Schoenfeld, Cordarro S ([id number]) [id number] Page 4 of 7 Subjective Chief Complaint Information obtained from Patient Patient is at the clinic for treatment of an open pressure ulcer History of Present Illness (HPI) ADMISSION 07/12/2023 This is a 89 year old nondiabetic with a history of polymyalgia rheumatica on chronic steroid therapy. He has had a number of recent hospitalizations for various reasons that resulted in debility. After his most recent hospital stay, he was discharged to a skilled nursing facility and returned home  about a month ago. One of the issues that multiple specialists have been investigating and has been an elevated sed rate and CRP, along with chronic elevation of his white blood cell count. No obvious infectious etiology appears to have been identified. Due to his limited mobility, he is spent a fair amount of time sitting and subsequently developed a stage III sacral pressure ulcer. He does have a home health nurse/aide and his wife is very active in his care. They have been applying hydrogel and a foam border dressing to the site. He has an air mattress and his wife has been encouraging him to  Alginate Dressing, 4x4.75 (in/in) Every Other Day/30 Days ary Discharge Instructions: Apply to wound bed as instructed Secondary Dressing: Zetuvit Plus Silicone Border Sacrum Dressing, Sm, 7x7 (in/in) Every Other Day/30 Days Discharge Instructions: Apply silicone border over primary dressing as directed. 08/18/2023: The initial ulcer is actually healed, but there is a little bit of dry skin that peeled away leaving a very small superficial opening just lateral to the midline. No debridement was necessary today. Continue silver alginate with a foam padded dressing. Continue offloading. Follow-up in 2 weeks, at which time he is almost certain to be healed. Electronic Signature(s) Signed: 08/18/2023 11:28:29 AM By: Duanne Guess MD FACS Entered By: Duanne Guess on 08/18/2023 08:28:29 -------------------------------------------------------------------------------- HxROS Details Patient Name: Date of Service: [name], Pranav S. 08/18/2023 10:45 A M Medical Record Number: [id number] Patient  Account Number: [id number] XYION, VIRDEN ([id number]) [id number] Page 6 of 7 Date of Birth/Sex: Treating RN: 25-Jan-1930 (89 y.o. M) Primary Care Provider: Other Clinician: Ezequiel Kayser Referring Provider: Treating Provider/Extender: Dorthea Cove in Treatment: 5 Information Obtained From Patient Chart Eyes Medical History: Positive for: Cataracts - bil extractions Negative for: Glaucoma; Optic Neuritis Past Medical History Notes: retinal vein occlusion Ear/Nose/Mouth/Throat Medical History: Negative for: Chronic sinus problems/congestion; Middle ear problems Past Medical History Notes: seasonal allergies Hematologic/Lymphatic Medical History: Positive for: Anemia Negative for: Hemophilia; Human Immunodeficiency Virus; Lymphedema; Sickle Cell Disease Respiratory Medical History: Negative for: Aspiration; Asthma; Chronic Obstructive Pulmonary Disease (COPD); Pneumothorax; Sleep Apnea; Tuberculosis Past Medical History Notes: interstitial lung disease, O2 dependent, hx PE Cardiovascular Medical History: Positive for: Congestive Heart Failure; Deep Vein Thrombosis Negative for: Arrhythmia Past Medical History Notes: AAA, hyperlipidemia, aortitis, hx PNA, eosinophic pneumonia Endocrine Medical History: Negative for: Type I Diabetes; Type II Diabetes Genitourinary Medical History: Negative for: End Stage Renal Disease Past Medical History Notes: BPH, foley cath Integumentary (Skin) Medical History: Negative for: History of Burn Musculoskeletal Medical History: Positive for: Osteoarthritis Negative for: Gout; Rheumatoid Arthritis; Osteomyelitis Past Medical History Notes: polymyalgia rheumatica, degenerative disk dx, dupuytren's contracture Oncologic Medical History: Negative for: Received Chemotherapy; Received Radiation Psychiatric Medical History: Negative for: Lanney Gins Anxiety Henslee,  Quillan S ([id number]) [id number] Page 7 of 7 HBO Extended History Items Eyes: Cataracts Immunizations Pneumococcal Vaccine: Received Pneumococcal Vaccination: Yes Received Pneumococcal Vaccination On or After 60th Birthday: Yes Implantable Devices None Family and Social History Cancer: Yes - Siblings; Diabetes: No; Heart Disease: Yes - Mother; Hereditary Spherocytosis: No; Hypertension: No; Kidney Disease: No; Lung Disease: No; Seizures: No; Stroke: No; Thyroid Problems: No; Tuberculosis: No; Never smoker; Marital Status - Married; Alcohol Use: Rarely; Drug Use: No History; Caffeine Use: Rarely; Financial Concerns: No; Food, Clothing or Shelter Needs: No; Support System Lacking: No; Transportation Concerns: No Electronic Signature(s) Signed: 08/18/2023 11:30:17 AM By: Duanne Guess MD FACS Entered By: Duanne Guess on 08/18/2023 08:27:00 -------------------------------------------------------------------------------- SuperBill Details Patient Name: Date of Service: [name], Cataldo S. 08/18/2023 Medical Record Number: [id number] Patient Account Number: [id number] Date of Birth/Sex: Treating RN: 03/13/1930 (89 y.o. [name] Primary Care Provider: Ezequiel Kayser Other Clinician: Referring Provider: Treating Provider/Extender: Dorthea Cove in Treatment: 5 Diagnosis Coding ICD-10 Codes Code Description [id number] Pressure ulcer of sacral region, stage 3 Z79.52 Long term (current) use of systemic steroids M35.3 Polymyalgia rheumatica Facility Procedures : CPT4 Code: 25956387 Description: 99213 - WOUND CARE VISIT-LEV 3 EST PT Modifier: Quantity: 1 Physician Procedures : CPT4 Code Description Modifier [id number] 99213 - WC PHYS LEVEL 3 -

## 2023-08-20 ENCOUNTER — Telehealth: Payer: Self-pay | Admitting: Internal Medicine

## 2023-08-20 NOTE — Telephone Encounter (Signed)
Hi Dr [name],   [name] - wife indicated to me during conversation aabout his health that he is willing to [facility] BM biopsy  Thanks    SIGNATURE    Dr. Kalman Shan, M.D., F.C.C.P,  Pulmonary and Critical Care Medicine Staff Physician, [facility] Director - Interstitial Lung Disease  Program  Pulmonary Fibrosis [facility] at [facility] [place], [place], [address]   Pager: [phone], If no answer  -> Check AMION or Try [phone] Telephone (clinical office): [phone] Telephone (research): [phone]  5:25 PM 08/20/2023

## 2023-08-21 DIAGNOSIS — M545 Low back pain, unspecified: Secondary | ICD-10-CM | POA: Diagnosis not present

## 2023-08-21 DIAGNOSIS — R269 Unspecified abnormalities of gait and mobility: Secondary | ICD-10-CM | POA: Diagnosis not present

## 2023-08-21 DIAGNOSIS — R5382 Chronic fatigue, unspecified: Secondary | ICD-10-CM | POA: Diagnosis not present

## 2023-08-21 DIAGNOSIS — M79605 Pain in left leg: Secondary | ICD-10-CM | POA: Diagnosis not present

## 2023-08-22 ENCOUNTER — Other Ambulatory Visit: Payer: Self-pay | Admitting: Physician Assistant

## 2023-08-22 DIAGNOSIS — M353 Polymyalgia rheumatica: Secondary | ICD-10-CM

## 2023-08-23 DIAGNOSIS — R338 Other retention of urine: Secondary | ICD-10-CM | POA: Diagnosis not present

## 2023-08-24 ENCOUNTER — Other Ambulatory Visit: Payer: Self-pay | Admitting: Physician Assistant

## 2023-08-24 DIAGNOSIS — M545 Low back pain, unspecified: Secondary | ICD-10-CM | POA: Diagnosis not present

## 2023-08-24 DIAGNOSIS — R5382 Chronic fatigue, unspecified: Secondary | ICD-10-CM | POA: Diagnosis not present

## 2023-08-24 DIAGNOSIS — R269 Unspecified abnormalities of gait and mobility: Secondary | ICD-10-CM | POA: Diagnosis not present

## 2023-08-24 DIAGNOSIS — M79605 Pain in left leg: Secondary | ICD-10-CM | POA: Diagnosis not present

## 2023-08-24 DIAGNOSIS — Z23 Encounter for immunization: Secondary | ICD-10-CM | POA: Diagnosis not present

## 2023-08-25 ENCOUNTER — Encounter: Payer: Self-pay | Admitting: Infectious Disease

## 2023-08-28 ENCOUNTER — Ambulatory Visit (HOSPITAL_COMMUNITY): Payer: Medicare Other | Attending: Internal Medicine

## 2023-08-28 DIAGNOSIS — R5382 Chronic fatigue, unspecified: Secondary | ICD-10-CM | POA: Diagnosis not present

## 2023-08-28 DIAGNOSIS — R269 Unspecified abnormalities of gait and mobility: Secondary | ICD-10-CM | POA: Diagnosis not present

## 2023-08-28 DIAGNOSIS — M545 Low back pain, unspecified: Secondary | ICD-10-CM | POA: Diagnosis not present

## 2023-08-28 DIAGNOSIS — M79605 Pain in left leg: Secondary | ICD-10-CM | POA: Diagnosis not present

## 2023-08-28 DIAGNOSIS — R0609 Other forms of dyspnea: Secondary | ICD-10-CM | POA: Diagnosis not present

## 2023-08-28 LAB — ECHOCARDIOGRAM COMPLETE
AR max vel: 2.62 cm2
AV Area VTI: 2.75 cm2
AV Area mean vel: 2.57 cm2
AV Mean grad: 12 mm[Hg]
AV Peak grad: 17.3 mm[Hg]
Ao pk vel: 2.08 m/s
Area-P 1/2: 4.56 cm2
S' Lateral: 2.8 cm

## 2023-08-28 NOTE — Progress Notes (Signed)
N/A N/A N/A N/A N/A N/A N/A] Treatment Notes Wound #1 (Sacrum) Cleanser Peri-Wound Care Topical Primary Dressing Maxorb Extra Ag+ Alginate Dressing, 4x4.75 (Alexander/Alexander) Discharge Instruction: Apply to wound bed as instructed Secondary Dressing Zetuvit Plus Silicone Border Sacrum Dressing, Sm, 7x7 (Alexander/Alexander) Discharge Instruction: Apply silicone border over primary dressing as directed. Secured With Compression Wrap Compression Stockings Facilities manager) Signed: 08/18/2023 11:26:05 AM By: Duanne Guess MD FACS Entered By: Duanne Guess on 08/18/2023 11:26:05 -------------------------------------------------------------------------------- Multi-Disciplinary Care Plan Details Patient Name: Date of Service: [name], [name]. 08/18/2023 10:45 A M Medical Record Number: [id number] Patient Account Number: [id number] Date of Birth/Sex: Treating RN: 03-Apr-1930 (89 y.o. [name], [name], [name] ([id number]) [id number] Page 5 of 8 Primary Care Martie Fulgham: Ezequiel Kayser Other Clinician: Referring Avia Merkley: Treating Donae Kueker/Extender: Dorthea Cove [name]: 5 Multidisciplinary Care Plan reviewed with physician Active Inactive Abuse / Safety / Falls / Self Care Management Nursing Diagnoses: Impaired physical mobility Potential for falls Goals: Patient/caregiver will verbalize/demonstrate measures taken to  prevent injury and/or falls Date Initiated: 07/20/2023 Target Resolution Date: 08/09/2023 Goal Status: Active Interventions: Assess: immobility, friction, shearing, incontinence upon admission and as needed Assess impairment of mobility on admission and as needed per policy Notes: Pressure Nursing Diagnoses: Knowledge deficit related to management of pressures ulcers Potential for impaired tissue integrity related to pressure, friction, moisture, and shear Goals: Patient/caregiver will verbalize understanding of pressure ulcer management Date Initiated: 07/20/2023 Target Resolution Date: 08/09/2023 Goal Status: Active Interventions: Assess: immobility, friction, shearing, incontinence upon admission and as needed Assess potential for pressure ulcer upon admission and as needed Provide education on pressure ulcers Treatment Activities: Pressure reduction/relief device ordered : 07/12/2023 Notes: Wound/Skin Impairment Nursing Diagnoses: Impaired tissue integrity Knowledge deficit related to ulceration/compromised skin integrity Goals: Patient/caregiver will verbalize understanding of skin care regimen Date Initiated: 07/20/2023 Target Resolution Date: 08/09/2023 Goal Status: Active Ulcer/skin breakdown will have a volume reduction of 30% by week 4 Date Initiated: 07/20/2023 Target Resolution Date: 08/09/2023 Goal Status: Active Interventions: Assess patient/caregiver ability to obtain necessary supplies Assess patient/caregiver ability to perform ulcer/skin care regimen upon admission and as needed Assess ulceration(Alexander) every visit Provide education on ulcer and skin care Treatment Activities: Skin care regimen initiated : 07/12/2023 Topical wound management initiated : 07/12/2023 Notes: Electronic Signature(Alexander) Signed: 08/28/2023 2:03:22 PM By: Randall Hoyer, Kailen Alexander ([id number]) [id number] Page 6 of 8 Entered By: [name] on 08/18/2023  11:19:17 -------------------------------------------------------------------------------- Pain Assessment Details Patient Name: Date of Service: [name], [name]. 08/18/2023 10:45 A M Medical Record Number: [id number] Patient Account Number: [id number] Date of Birth/Sex: Treating RN: 1929/12/30 (89 y.o. [name] Primary Care Diera Wirkkala: Ezequiel Kayser Other Clinician: Referring Elaynah Virginia: Treating Amberia Bayless/Extender: Dorthea Cove [name]: 5 Active Problems Location of Pain Severity and Description of Pain Patient Has Paino No Site Locations Pain Management and Medication Current Pain Management: Electronic Signature(Alexander) Signed: 08/28/2023 2:03:22 PM By: [name] Entered By: [name] on 08/18/2023 11:09:18 -------------------------------------------------------------------------------- Patient/Caregiver Education Details Patient Name: Date of Service: [name], [name]. 9/20/2024andnbsp10:45 A M Medical Record Number: [id number] Patient Account Number: [id number] Date of Birth/Gender: Treating RN: 1930/04/16 (89 y.o. [name] Primary Care Physician: Ezequiel Kayser Other Clinician: Referring Physician: Treating Physician/Extender: Dorthea Cove [name]: 5 Education Assessment Education Provided To: Patient and Caregiver Education Topics Provided Wound/Skin ImpairmentEDGER, [name] ([id number]) [id number] Page 7 of 8 Methods: Explain/Verbal Responses: State content  correctly Electronic Signature(Alexander) Signed: 08/28/2023 2:03:22 PM By: [name] Entered By: [name] on 08/18/2023 11:19:33 -------------------------------------------------------------------------------- Wound Assessment Details Patient Name: Date of Service: [name], [name]. 08/18/2023 10:45 A M Medical Record Number: [id number] Patient Account Number: [id number] Date of Birth/Sex: Treating  RN: 03/14/1930 (89 y.o. [name] Primary Care Sonam Wandel: Ezequiel Kayser Other Clinician: Referring Eunice Winecoff: Treating Sierah Lacewell/Extender: Dorthea Cove [name]: 5 Wound Status Wound Number: 1 Primary Etiology: Pressure Ulcer Wound Location: Sacrum Wound Status: Open Wounding Event: Pressure Injury Date Acquired: 06/07/2023 Weeks Of Treatment: 5 Clustered Wound: No Wound Measurements Length: (cm) 0.1 Width: (cm) 0.1 Depth: (cm) 0.1 Area: (cm) 0.008 Volume: (cm) 0.001 % Reduction Alexander Area: 99.9% % Reduction Alexander Volume: 99.9% Wound Description Classification: Exudate Amount: Exudate Type: Exudate Color: Category/Stage III Medium Serous amber Periwound Skin Texture Texture Color No Abnormalities Noted: No No Abnormalities Noted: No Moisture No Abnormalities Noted: No Treatment Notes Wound #1 (Sacrum) Cleanser Peri-Wound Care Topical Primary Dressing Maxorb Extra Ag+ Alginate Dressing, 4x4.75 (Alexander/Alexander) Discharge Instruction: Apply to wound bed as instructed Secondary Dressing Zetuvit Plus Silicone Border Sacrum Dressing, Sm, 7x7 (Alexander/Alexander) Discharge Instruction: Apply silicone border over primary dressing as directed. Secured With BJ'Alexander, Hamed Alexander ([id number]) [id number] Page 8 of 8 Compression Stockings Add-Ons Electronic Signature(Alexander) Signed: 08/28/2023 2:03:22 PM By: [name] Entered By: [name] on 08/18/2023 11:16:49 -------------------------------------------------------------------------------- Vitals Details Patient Name: Date of Service: [name], [name]. 08/18/2023 10:45 A M Medical Record Number: [id number] Patient Account Number: [id number] Date of Birth/Sex: Treating RN: 1930-10-05 (89 y.o. [name] Primary Care Emmajo Bennette: Ezequiel Kayser Other Clinician: Referring Baudelia Schroepfer: Treating Karan Ramnauth/Extender: Dorthea Cove [name]:  5 Vital Signs Time Taken: 11:08 Temperature (F): 97.5 Height (Alexander): 72 Pulse (bpm): 97 Weight (lbs): 144 Respiratory Rate (breaths/min): 18 Body Mass Index (BMI): 19.5 Blood Pressure (mmHg): 119/83 Reference Range: 80 - 120 mg / dl Electronic Signature(Alexander) Signed: 08/28/2023 2:03:22 PM By: [name] Entered By: [name] on 08/18/2023 11:09:12  N/A N/A N/A N/A N/A N/A N/A] Treatment Notes Wound #1 (Sacrum) Cleanser Peri-Wound Care Topical Primary Dressing Maxorb Extra Ag+ Alginate Dressing, 4x4.75 (Alexander/Alexander) Discharge Instruction: Apply to wound bed as instructed Secondary Dressing Zetuvit Plus Silicone Border Sacrum Dressing, Sm, 7x7 (Alexander/Alexander) Discharge Instruction: Apply silicone border over primary dressing as directed. Secured With Compression Wrap Compression Stockings Facilities manager) Signed: 08/18/2023 11:26:05 AM By: Duanne Guess MD FACS Entered By: Duanne Guess on 08/18/2023 11:26:05 -------------------------------------------------------------------------------- Multi-Disciplinary Care Plan Details Patient Name: Date of Service: [name], [name]. 08/18/2023 10:45 A M Medical Record Number: [id number] Patient Account Number: [id number] Date of Birth/Sex: Treating RN: 03-Apr-1930 (89 y.o. [name], [name], [name] ([id number]) [id number] Page 5 of 8 Primary Care Martie Fulgham: Ezequiel Kayser Other Clinician: Referring Avia Merkley: Treating Donae Kueker/Extender: Dorthea Cove [name]: 5 Multidisciplinary Care Plan reviewed with physician Active Inactive Abuse / Safety / Falls / Self Care Management Nursing Diagnoses: Impaired physical mobility Potential for falls Goals: Patient/caregiver will verbalize/demonstrate measures taken to  prevent injury and/or falls Date Initiated: 07/20/2023 Target Resolution Date: 08/09/2023 Goal Status: Active Interventions: Assess: immobility, friction, shearing, incontinence upon admission and as needed Assess impairment of mobility on admission and as needed per policy Notes: Pressure Nursing Diagnoses: Knowledge deficit related to management of pressures ulcers Potential for impaired tissue integrity related to pressure, friction, moisture, and shear Goals: Patient/caregiver will verbalize understanding of pressure ulcer management Date Initiated: 07/20/2023 Target Resolution Date: 08/09/2023 Goal Status: Active Interventions: Assess: immobility, friction, shearing, incontinence upon admission and as needed Assess potential for pressure ulcer upon admission and as needed Provide education on pressure ulcers Treatment Activities: Pressure reduction/relief device ordered : 07/12/2023 Notes: Wound/Skin Impairment Nursing Diagnoses: Impaired tissue integrity Knowledge deficit related to ulceration/compromised skin integrity Goals: Patient/caregiver will verbalize understanding of skin care regimen Date Initiated: 07/20/2023 Target Resolution Date: 08/09/2023 Goal Status: Active Ulcer/skin breakdown will have a volume reduction of 30% by week 4 Date Initiated: 07/20/2023 Target Resolution Date: 08/09/2023 Goal Status: Active Interventions: Assess patient/caregiver ability to obtain necessary supplies Assess patient/caregiver ability to perform ulcer/skin care regimen upon admission and as needed Assess ulceration(Alexander) every visit Provide education on ulcer and skin care Treatment Activities: Skin care regimen initiated : 07/12/2023 Topical wound management initiated : 07/12/2023 Notes: Electronic Signature(Alexander) Signed: 08/28/2023 2:03:22 PM By: Randall Hoyer, Kailen Alexander ([id number]) [id number] Page 6 of 8 Entered By: [name] on 08/18/2023  11:19:17 -------------------------------------------------------------------------------- Pain Assessment Details Patient Name: Date of Service: [name], [name]. 08/18/2023 10:45 A M Medical Record Number: [id number] Patient Account Number: [id number] Date of Birth/Sex: Treating RN: 1929/12/30 (89 y.o. [name] Primary Care Diera Wirkkala: Ezequiel Kayser Other Clinician: Referring Elaynah [place]: Treating Amberia Bayless/Extender: Dorthea Cove [name]: 5 Active Problems Location of Pain Severity and Description of Pain Patient Has Paino No Site Locations Pain Management and Medication Current Pain Management: Electronic Signature(Alexander) Signed: 08/28/2023 2:03:22 PM By: [name] Entered By: [name] on 08/18/2023 11:09:18 -------------------------------------------------------------------------------- Patient/Caregiver Education Details Patient Name: Date of Service: [name], [name]. 9/20/2024andnbsp10:45 A M Medical Record Number: [id number] Patient Account Number: [id number] Date of Birth/Gender: Treating RN: 1930/04/16 (89 y.o. [name] Primary Care Physician: Ezequiel Kayser Other Clinician: Referring Physician: Treating Physician/Extender: Dorthea Cove [name]: 5 Education Assessment Education Provided To: Patient and Caregiver Education Topics Provided Wound/Skin ImpairmentEDGER, [name] ([id number]) [id number] Page 7 of 8 Methods: Explain/Verbal Responses: State content  [name], [name] ([id number]) [id number] Page 1 of 8 Visit Report for 08/18/2023 Arrival Information Details Patient Name: Date of Service: [name], Randall Dakota Alexander. 08/18/2023 10:45 A M Medical Record Number: [id number] Patient Account Number: [id number] Date of Birth/Sex: Treating RN: 03/27/1930 (89 y.o. [name] Primary Care Dunbar Buras: Ezequiel Kayser Other Clinician: Referring Daiana Vitiello: Treating Alyria Krack/Extender: Dorthea Cove [name]: 5 Visit Information History Since Last Visit All ordered tests and consults were completed: Yes Patient Arrived: Wheel Chair Added or deleted any medications: No Arrival Time: 11:03 Any new allergies or adverse reactions: No Accompanied By: family Had a fall or experienced change Alexander No Transfer Assistance: EasyPivot Patient Lift activities of daily living that may affect Patient Requires Transmission-Based Precautions: No risk of falls: Patient Has Alerts: No Signs or symptoms of abuse/neglect since last visito No Hospitalized since last visit: No Implantable device outside of the clinic excluding No cellular tissue based products placed Alexander the center since last visit: Has Dressing Alexander Place as Prescribed: Yes Pain Present Now: No Electronic Signature(Alexander) Signed: 08/28/2023 2:03:22 PM By: [name] Entered By: [name] on 08/18/2023 11:08:07 -------------------------------------------------------------------------------- Clinic Level of Care Assessment Details Patient Name: Date of Service: [name], [name]. 08/18/2023 10:45 A M Medical Record Number: [id number] Patient Account Number: [id number] Date of Birth/Sex: Treating RN: 03-04-1930 (89 y.o. [name] Primary Care Maurina Fawaz: Ezequiel Kayser Other Clinician: Referring Jonan Seufert: Treating Janard Culp/Extender: Dorthea Cove [name]: 5 Clinic Level of Care Assessment Items TOOL 4 Quantity  Score X- 1 0 Use when only an EandM is performed on FOLLOW-UP visit ASSESSMENTS - Nursing Assessment / Reassessment X- 1 10 Reassessment of Co-morbidities (includes updates Alexander patient status) X- 1 5 Reassessment of Adherence to Treatment Plan ASSESSMENTS - Wound and Skin A ssessment / Reassessment X - Simple Wound Assessment / Reassessment - one wound 1 5 X- 1 5 Complex Wound Assessment / Reassessment - multiple wounds X- 1 10 Dermatologic / Skin Assessment (not related to wound area) ASSESSMENTS - Focused Assessment []  - 0 Circumferential Edema Measurements - multi extremities []  - 0 Nutritional Assessment / Counseling / Intervention [name], [name] ([id number]) [id number] Page 2 of 8 []  - 0 Lower Extremity Assessment (monofilament, tuning fork, pulses) []  - 0 Peripheral Arterial Disease Assessment (using hand held doppler) ASSESSMENTS - Ostomy and/or Continence Assessment and Care []  - 0 Incontinence Assessment and Management []  - 0 Ostomy Care Assessment and Management (repouching, etc.) PROCESS - Coordination of Care X - Simple Patient / Family Education for ongoing care 1 15 []  - 0 Complex (extensive) Patient / Family Education for ongoing care X- 1 10 Staff obtains Chiropractor, Records, T Results / Process Orders est []  - 0 Staff telephones HHA, Nursing Homes / Clarify orders / etc []  - 0 Routine Transfer to another Facility (non-emergent condition) []  - 0 Routine Hospital Admission (non-emergent condition) []  - 0 New Admissions / Manufacturing engineer / Ordering NPWT Apligraf, etc. , []  - 0 Emergency Hospital Admission (emergent condition) X- 1 10 Simple Discharge Coordination []  - 0 Complex (extensive) Discharge Coordination PROCESS - Special Needs []  - 0 Pediatric / Minor Patient Management []  - 0 Isolation Patient Management []  - 0 Hearing / Language / Visual special needs []  - 0 Assessment of Community assistance  (transportation, D/C planning, etc.) []  - 0 Additional assistance / Altered mentation []  - 0 Support Surface(Alexander) Assessment (bed, cushion, seat, etc.) INTERVENTIONS - Wound Cleansing /

## 2023-08-29 ENCOUNTER — Ambulatory Visit (INDEPENDENT_AMBULATORY_CARE_PROVIDER_SITE_OTHER): Payer: Medicare Other | Admitting: Internal Medicine

## 2023-08-29 ENCOUNTER — Encounter: Payer: Self-pay | Admitting: Internal Medicine

## 2023-08-29 VITALS — BP 106/70 | HR 99 | Ht 72.0 in | Wt 148.4 lb

## 2023-08-29 DIAGNOSIS — R700 Elevated erythrocyte sedimentation rate: Secondary | ICD-10-CM

## 2023-08-29 DIAGNOSIS — R5383 Other fatigue: Secondary | ICD-10-CM | POA: Diagnosis not present

## 2023-08-29 DIAGNOSIS — R5381 Other malaise: Secondary | ICD-10-CM | POA: Diagnosis not present

## 2023-08-29 DIAGNOSIS — R972 Elevated prostate specific antigen [PSA]: Secondary | ICD-10-CM

## 2023-08-29 DIAGNOSIS — J9601 Acute respiratory failure with hypoxia: Secondary | ICD-10-CM | POA: Diagnosis not present

## 2023-08-29 DIAGNOSIS — D649 Anemia, unspecified: Secondary | ICD-10-CM | POA: Diagnosis not present

## 2023-08-29 DIAGNOSIS — J849 Interstitial pulmonary disease, unspecified: Secondary | ICD-10-CM

## 2023-08-29 NOTE — Progress Notes (Signed)
test.-> discussed with him 08/08/2023 - feels supportive care best. He has been in touch with them  #Elevated ESR greater since February 2024 and [facility] health medical records (according to primary care physician present for couple of years]   - No clear etiology known.  He in the past he has not responded to increase prednisone for polymyalgia rheumatica.  We will recheck this again today.  #History of SVT approximately atrial fibrillation April 2024 follows with Dr. Gaynelle Arabian  -Most recent visit was on 07/19/2023.  Noted to be on Toprol  #Elevated PSA 07/03/2023  - The been following with [facility] in [facility].  For the last 2 months he has now had a Foley catheter because of enlarged prostate they are not aware of a prostate cancer diagnosis.  I did reach out to Dr. Alvester Morin and have asked him to call me back but he has a chronic Foley for the last 2 months now.  No issues currently on this.   -Did discuss with Dr. Alvester Morin.  He does not think prostate cancer is a possibility.  Moreover with the age and no evidence of metastasis it is a nonissue.  He plans for IR embolization of the prostate to shrink it and have the Foley catheter come out.  #Physical deconditioning and fatigue   -His functional strength is improved he is able to transfer better.  He is able to sit stand.  He is  able to walk in the hallway or in the driveway with his walker.  However fatigue is significant.  The son took him to [employer] course and had him sit in the golf cart.  They watched for golf teams come by and after the patient got exhausted and had to go home despite eating a hot dog.   #g symptoms burden  -All significant and see below.  -I do not think the ILD is contributing to the symptom on the other hand is multifactorial and from anemia and high ESR and frailty and cachexia.  -His appetite is diminished but he is able to eat and not gaining weight though.  #Decub ulcer  -Being followed by wound care   #Goals of care  -Remains full code.  They have hesitated on undergoing procedures because of risk and bone marrow biopsy because of pain.   I did indicate to them that sometimes risk might have to be taken as long it is not undue to undergo certain procedures in order to establish clarity in the prognosis and diagnosis.  If reversible etiologies are found then he can improve but if your reversible etiologies are found then at least we have clarity.  They are processing this information. Ultimtely risk v beneift to be dcided by them/patoient an dproceduralists in shared decisin maknig         OV 08/29/2023  Subjective:  Patient ID: [name], male , DOB: 12-May-1930 , age 89 y.o. , MRN: [id number] , ADDRESS: [address] [place] [address] PCP [name], MD Patient Care Team: [name], MD as PCP - General (Internal Medicine) Little Ishikawa, MD as PCP - Cardiology (Cardiology) Holli Humbles, MD as Referring Physician (Ophthalmology) Pryor Ochoa, MD (Inactive) as Consulting Physician (Vascular Surgery) Hilarie Fredrickson, MD as Consulting Physician (Gastroenterology)  This Provider for this visit: Treatment Team:  Attending Provider: Kalman Shan, MD    08/29/2023 -   Chief Complaint  Patient presents with   Follow-up  having bowel trouble at [facility] rehab for about a month with constipation. He has been started on aggressive bowel regimen and has required manual disimpaction per his wife. He had large explosive bowel movement in clinic today.    His prednisone was reduced to 20mg  daily at rehab due to concerns of intolerance of high dose therapy. He continues bactrim DS 1 tab 3 days per week.    He is working on walking with a walker, he walked 32 steps yesterday without assistance.    He has reduced appetite and is trying to drink protein supplements daily.     06/14/2023 - Interim hx Patient presents today for 1 month follow-up/acute office visit.  He was seen by Dr. Francine Graven on 05/01/2023 hospital follow-up due to respiratory failure in the setting of interstitial lung disease.  Patient has a history of Polymyalgia rheumatica on chronic prednisone.  Differential for respiratory failure includes eosinophilic pneumonia in the setting of daptomycin use versus progressive ILD process.  Patient has mild basilar reticulation on CT chest from March 2022.  Inflammatory workup has been negative.  Follow-up chest x-ray on 05/01/2023 showed patchy  bilateral interstitial and alveolar opacity bilaterally not significantly changed compared to prior chest x-ray from April 2024.    Accompanied by his wife and nursing aid today Breathing is about the same  He is on supplemental oxygen, for the most part he is using 1L oxygen at home  He is on 20mg  prednisone daily with Bactrim double strength 1 tablet 3 days/week for pneumocystitis prophylaxis.  Need to discuss prednisone taper if able    Patient had UTI symptoms (frequency/urgency, fever and confusion) on July 6. He had some air hunger at this time. No associated URI symptoms or cough. Prescribed abx Augmentin x 10 days and doxycycline x10 days. CXR done with Dr. Waynard Edwards, results are not accessible in Epic. He is doing better. He is more tired last several days. He is having some stomach/abdomen discomfort. He does struggle with constipation.  He had a small bowel movement today.  He has been compliant with stool softener.  Plan  - reduce prednisone to 15mg  per day  OV 07/04/2023 -transfer of care to Dr. Marchelle Gearing in the ILD center.  Subjective:  Patient ID: [name], male , DOB: [birth year] , age 89 y.o. , MRN: [id number] , ADDRESS: [address] [place] [address] PCP [name], MD Patient Care Team: [name], MD as PCP - General (Internal Medicine) Little Ishikawa, MD as PCP - Cardiology (Cardiology) Holli Humbles, MD as Referring Physician (Ophthalmology) Pryor Ochoa, MD (Inactive) as Consulting Physician (Vascular Surgery) Hilarie Fredrickson, MD as Consulting Physician (Gastroenterology)  This Provider for this visit: Treatment Team:  Attending Provider: Kalman Shan, MD    07/04/2023 -   Chief Complaint  Patient presents with   Hospitalization Follow-up    New pt. And advise on health     HPI [name] 89 y.o. 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  Here with his wife Darl Pikes and also caretaker Bonita Quin.  History is provided by the  wife, caretaker, a little bit also from the patient also review of the external medical record.  All this was done on 07/03/2023.  Subsequent history also taken on 07/04/2023 the following day from [place].  Neither primary care physician.  The very complex story.  It appears that even at baseline he has had some mild atelectasis changes in his lower lung fields but this was essentially asymptomatic  able to walk in the hallway or in the driveway with his walker.  However fatigue is significant.  The family did concede this is somewhat better.  [place] symptom assessment score is improved [see below].  #GI symptoms -Low appetite continues but is somewhat better - Nausea this continues to be a problem.  There is some better perhaps.  The amount of Zofran the using is less.  But is still a significant problem.  Wife thinks is associated with acid reflux.   #Decub ulcer  -Being followed by wound care   #Goals of care  -Remains full code.    [place] Symptom Assessment Numerical  Scale 0 is no problem -> 10 worst problem 08/08/2023   08/29/2023   No Pain -> Worst pain 5 0  No Tiredness -> Worset tiredness 8 7  No Nausea -> Worst nausea 7 6  No Depression -> Worst depression 5 7  No Anxiety -> Worst Anxiety 6 6  No Drowsiness -> Worst Drowsiness 0 5  Best appetite-> Worst Appetitle 5 4  Best Feeling of well being -> Worst feeling 6 3  No dyspnea-> Worst dyspnea 1 4  Other problem (none -> severe) x x  Completed by  Care guver ad patietn Care giver         CT Chest data from date: SPET 2024  - personally visualized and independently interpreted :YES - my findings are: MILD ild AT BASE   Simple office walk 224 (66+46 x 2) feet Pod A at [facility] x  3 laps goal with forehead probe 08/08/2023  08/29/2023   O2 used ra ra  Number laps completed Sit stand x 5  Sitting in wheel chair  Comments about pace Slow but can do   Resting Pulse Ox/HR 100% and 125/min 97% RA At rest  Final Pulse Ox/HR 100% and 134/min   Desaturated </= 88% no   Desaturated <= 3% points no   Got Tachycardic >/= 90/min yes   Symptoms at end of test x   Miscellaneous comments tachycardia      LAB RESULTS last 96 hours ECHOCARDIOGRAM COMPLETE  Result Date: 08/28/2023    ECHOCARDIOGRAM REPORT   Patient Name:   [name] Date of Exam: 08/28/2023 Medical Rec #:  [id number]      Height:       73.0 in Accession #:    [id number]     Weight:       148.0 lb Date of Birth:  [birth year]     BSA:          1.893 m Patient Age:    89 years       BP:           124/70 mmHg Patient Gender: M              HR:           91 bpm. Exam Location:  [address] Procedure: 2D Echo, 3D Echo, Cardiac Doppler and Color Doppler Indications:    R06.00 Dyspnea  History:        Patient has prior history of Echocardiogram examinations, most                 recent 03/06/2023. Abnormal ECG, PAD, Signs/Symptoms:Dyspnea; Risk                 Factors:Family History of Coronary Artery Disease. Aortic  and he was caring about his activities of daily living.   Has chronic history of polymyalgia rheumatica and has been on low-dose steroids for a few decades.  He has had persistently elevated ESR at least between 70-100 for the last 2 years according to PCP.  At some point because of possible PMR flare he was subjected increased prednisone dose but he always had this fatigue.   Review of the records indicate that starting February 2024 he started having leukocytosis and new onset anemia.  He did have an admission in the hospital at the time for fever.  Subsequently had extensive workup in the spring 2024 at [facility].  He had an admission for this.  They have an considered whether he might have aortitis.  This was in mid March 2024.  He was then treated with daptomycin in the hospital.  He was discharged in early April 2024 with a PICC line but shortly after that at home he collapsed.  Was diagnosed with submassive PE and interstitial pneumonitis.  He had no evidence of RV strain on the echo but his RV-LV ratio was 1.14.  Is on high flow oxygen.  Was given high-dose steroids.  During this time had ICU delirium including according to both primary care physician and D Vandam the infectious disease physician that I spoke to neuro suicidal ideations.  He was then discharged to [facility] where he spent 9 weeks and finally got home on 05/31/2023.  He is currently undergoing intense physical therapy at home.  During all this his high flow oxygen needs have improved he is such that he is only on 1-2 L nasal  cannula at rest.  The caretaker states that sometimes even at rest he is got normal pulse ox and he can go several hours without using oxygen.  He is able to get around with a walker but when he walks a certain distance such as 30 to 40 feet he will get extremely tired.   Overall the wife initially said that he was not any better but they did admit that he is now more conditioned.  He is also having reduced oxygen needs but the fatigue is the main component.  In talking to primary care physician as well fatigue seems to be the biggest component.  In fact a few days ago primary care physician called him in and had his hemoglobin checked it was 7 g%.  He just finished 2 units of transfusion.  He is somewhat better but still feeling extremely fatigued.  His leukocytosis continues and wife is frustrated about this and feels this might be associated with the fatigue.  In terms of his PMR and ILD his current prednisone treatment is at 15 mg/day.  This was slowly tapered since his hospitalization in April 2024.  Dr. Particia Jasper had instructed him to take 15 mg/day.  This was a clinical taper.  Despite different doses of prednisone his ESR is always continue to be high and the reason for this is not known.  In terms of pulmonary embolism he continues his Eliquis low dose His last echocardiogram was in April 2024.  His EF is 55 to 60% and had no evidence of RV strain.   OV 07/10/2023  Subjective:  Patient ID: [name], male , DOB: Sep 25, 1930 , age 45 y.o. , MRN: [id number] , ADDRESS: [address] [place] [address] PCP [name], MD Patient Care Team: [name], MD as PCP - General (Internal Medicine)  able to walk in the hallway or in the driveway with his walker.  However fatigue is significant.  The family did concede this is somewhat better.  [place] symptom assessment score is improved [see below].  #GI symptoms -Low appetite continues but is somewhat better - Nausea this continues to be a problem.  There is some better perhaps.  The amount of Zofran the using is less.  But is still a significant problem.  Wife thinks is associated with acid reflux.   #Decub ulcer  -Being followed by wound care   #Goals of care  -Remains full code.    [place] Symptom Assessment Numerical  Scale 0 is no problem -> 10 worst problem 08/08/2023   08/29/2023   No Pain -> Worst pain 5 0  No Tiredness -> Worset tiredness 8 7  No Nausea -> Worst nausea 7 6  No Depression -> Worst depression 5 7  No Anxiety -> Worst Anxiety 6 6  No Drowsiness -> Worst Drowsiness 0 5  Best appetite-> Worst Appetitle 5 4  Best Feeling of well being -> Worst feeling 6 3  No dyspnea-> Worst dyspnea 1 4  Other problem (none -> severe) x x  Completed by  Care guver ad patietn Care giver         CT Chest data from date: SPET 2024  - personally visualized and independently interpreted :YES - my findings are: MILD ild AT BASE   Simple office walk 224 (66+46 x 2) feet Pod A at [facility] x  3 laps goal with forehead probe 08/08/2023  08/29/2023   O2 used ra ra  Number laps completed Sit stand x 5  Sitting in wheel chair  Comments about pace Slow but can do   Resting Pulse Ox/HR 100% and 125/min 97% RA At rest  Final Pulse Ox/HR 100% and 134/min   Desaturated </= 88% no   Desaturated <= 3% points no   Got Tachycardic >/= 90/min yes   Symptoms at end of test x   Miscellaneous comments tachycardia      LAB RESULTS last 96 hours ECHOCARDIOGRAM COMPLETE  Result Date: 08/28/2023    ECHOCARDIOGRAM REPORT   Patient Name:   [name] Date of Exam: 08/28/2023 Medical Rec #:  [id number]      Height:       73.0 in Accession #:    [id number]     Weight:       148.0 lb Date of Birth:  [birth year]     BSA:          1.893 m Patient Age:    89 years       BP:           124/70 mmHg Patient Gender: M              HR:           91 bpm. Exam Location:  [address] Procedure: 2D Echo, 3D Echo, Cardiac Doppler and Color Doppler Indications:    R06.00 Dyspnea  History:        Patient has prior history of Echocardiogram examinations, most                 recent 03/06/2023. Abnormal ECG, PAD, Signs/Symptoms:Dyspnea; Risk                 Factors:Family History of Coronary Artery Disease. Aortic  and he was caring about his activities of daily living.   Has chronic history of polymyalgia rheumatica and has been on low-dose steroids for a few decades.  He has had persistently elevated ESR at least between 70-100 for the last 2 years according to PCP.  At some point because of possible PMR flare he was subjected increased prednisone dose but he always had this fatigue.   Review of the records indicate that starting February 2024 he started having leukocytosis and new onset anemia.  He did have an admission in the hospital at the time for fever.  Subsequently had extensive workup in the spring 2024 at [facility].  He had an admission for this.  They have an considered whether he might have aortitis.  This was in mid March 2024.  He was then treated with daptomycin in the hospital.  He was discharged in early April 2024 with a PICC line but shortly after that at home he collapsed.  Was diagnosed with submassive PE and interstitial pneumonitis.  He had no evidence of RV strain on the echo but his RV-LV ratio was 1.14.  Is on high flow oxygen.  Was given high-dose steroids.  During this time had ICU delirium including according to both primary care physician and D Vandam the infectious disease physician that I spoke to neuro suicidal ideations.  He was then discharged to [facility] where he spent 9 weeks and finally got home on 05/31/2023.  He is currently undergoing intense physical therapy at home.  During all this his high flow oxygen needs have improved he is such that he is only on 1-2 L nasal  cannula at rest.  The caretaker states that sometimes even at rest he is got normal pulse ox and he can go several hours without using oxygen.  He is able to get around with a walker but when he walks a certain distance such as 30 to 40 feet he will get extremely tired.   Overall the wife initially said that he was not any better but they did admit that he is now more conditioned.  He is also having reduced oxygen needs but the fatigue is the main component.  In talking to primary care physician as well fatigue seems to be the biggest component.  In fact a few days ago primary care physician called him in and had his hemoglobin checked it was 7 g%.  He just finished 2 units of transfusion.  He is somewhat better but still feeling extremely fatigued.  His leukocytosis continues and wife is frustrated about this and feels this might be associated with the fatigue.  In terms of his PMR and ILD his current prednisone treatment is at 15 mg/day.  This was slowly tapered since his hospitalization in April 2024.  Dr. Particia Jasper had instructed him to take 15 mg/day.  This was a clinical taper.  Despite different doses of prednisone his ESR is always continue to be high and the reason for this is not known.  In terms of pulmonary embolism he continues his Eliquis low dose His last echocardiogram was in April 2024.  His EF is 55 to 60% and had no evidence of RV strain.   OV 07/10/2023  Subjective:  Patient ID: [name], male , DOB: Sep 25, 1930 , age 45 y.o. , MRN: [id number] , ADDRESS: [address] [place] [address] PCP [name], MD Patient Care Team: [name], MD as PCP - General (Internal Medicine)  test.-> discussed with him 08/08/2023 - feels supportive care best. He has been in touch with them  #Elevated ESR greater since February 2024 and [facility] health medical records (according to primary care physician present for couple of years]   - No clear etiology known.  He in the past he has not responded to increase prednisone for polymyalgia rheumatica.  We will recheck this again today.  #History of SVT approximately atrial fibrillation April 2024 follows with Dr. Gaynelle Arabian  -Most recent visit was on 07/19/2023.  Noted to be on Toprol  #Elevated PSA 07/03/2023  - The been following with Dr. Modena Slater in [facility].  For the last 2 months he has now had a Foley catheter because of enlarged prostate they are not aware of a prostate cancer diagnosis.  I did reach out to Dr. Alvester Morin and have asked him to call me back but he has a chronic Foley for the last 2 months now.  No issues currently on this.   -Did discuss with Dr. Alvester Morin.  He does not think prostate cancer is a possibility.  Moreover with the age and no evidence of metastasis it is a nonissue.  He plans for IR embolization of the prostate to shrink it and have the Foley catheter come out.  #Physical deconditioning and fatigue   -His functional strength is improved he is able to transfer better.  He is able to sit stand.  He is  able to walk in the hallway or in the driveway with his walker.  However fatigue is significant.  The son took him to [employer] course and had him sit in the golf cart.  They watched for golf teams come by and after the patient got exhausted and had to go home despite eating a hot dog.   #g symptoms burden  -All significant and see below.  -I do not think the ILD is contributing to the symptom on the other hand is multifactorial and from anemia and high ESR and frailty and cachexia.  -His appetite is diminished but he is able to eat and not gaining weight though.  #Decub ulcer  -Being followed by wound care   #Goals of care  -Remains full code.  They have hesitated on undergoing procedures because of risk and bone marrow biopsy because of pain.   I did indicate to them that sometimes risk might have to be taken as long it is not undue to undergo certain procedures in order to establish clarity in the prognosis and diagnosis.  If reversible etiologies are found then he can improve but if your reversible etiologies are found then at least we have clarity.  They are processing this information. Ultimtely risk v beneift to be dcided by them/patoient an dproceduralists in shared decisin maknig         OV 08/29/2023  Subjective:  Patient ID: [name], male , DOB: 12-May-1930 , age 89 y.o. , MRN: [id number] , ADDRESS: [address] [place] [address] PCP [name], MD Patient Care Team: [name], MD as PCP - General (Internal Medicine) Little Ishikawa, MD as PCP - Cardiology (Cardiology) Holli Humbles, MD as Referring Physician (Ophthalmology) Pryor Ochoa, MD (Inactive) as Consulting Physician (Vascular Surgery) Hilarie Fredrickson, MD as Consulting Physician (Gastroenterology)  This Provider for this visit: Treatment Team:  Attending Provider: Kalman Shan, MD    08/29/2023 -   Chief Complaint  Patient presents with   Follow-up  test.-> discussed with him 08/08/2023 - feels supportive care best. He has been in touch with them  #Elevated ESR greater since February 2024 and [facility] health medical records (according to primary care physician present for couple of years]   - No clear etiology known.  He in the past he has not responded to increase prednisone for polymyalgia rheumatica.  We will recheck this again today.  #History of SVT approximately atrial fibrillation April 2024 follows with Dr. Gaynelle Arabian  -Most recent visit was on 07/19/2023.  Noted to be on Toprol  #Elevated PSA 07/03/2023  - The been following with Dr. Modena Slater in [facility].  For the last 2 months he has now had a Foley catheter because of enlarged prostate they are not aware of a prostate cancer diagnosis.  I did reach out to Dr. Alvester Morin and have asked him to call me back but he has a chronic Foley for the last 2 months now.  No issues currently on this.   -Did discuss with Dr. Alvester Morin.  He does not think prostate cancer is a possibility.  Moreover with the age and no evidence of metastasis it is a nonissue.  He plans for IR embolization of the prostate to shrink it and have the Foley catheter come out.  #Physical deconditioning and fatigue   -His functional strength is improved he is able to transfer better.  He is able to sit stand.  He is  able to walk in the hallway or in the driveway with his walker.  However fatigue is significant.  The son took him to [employer] course and had him sit in the golf cart.  They watched for golf teams come by and after the patient got exhausted and had to go home despite eating a hot dog.   #g symptoms burden  -All significant and see below.  -I do not think the ILD is contributing to the symptom on the other hand is multifactorial and from anemia and high ESR and frailty and cachexia.  -His appetite is diminished but he is able to eat and not gaining weight though.  #Decub ulcer  -Being followed by wound care   #Goals of care  -Remains full code.  They have hesitated on undergoing procedures because of risk and bone marrow biopsy because of pain.   I did indicate to them that sometimes risk might have to be taken as long it is not undue to undergo certain procedures in order to establish clarity in the prognosis and diagnosis.  If reversible etiologies are found then he can improve but if your reversible etiologies are found then at least we have clarity.  They are processing this information. Ultimtely risk v beneift to be dcided by them/patoient an dproceduralists in shared decisin maknig         OV 08/29/2023  Subjective:  Patient ID: [name], male , DOB: 12-May-1930 , age 89 y.o. , MRN: [id number] , ADDRESS: [address] [place] [address] PCP [name], MD Patient Care Team: [name], MD as PCP - General (Internal Medicine) Little Ishikawa, MD as PCP - Cardiology (Cardiology) Holli Humbles, MD as Referring Physician (Ophthalmology) Pryor Ochoa, MD (Inactive) as Consulting Physician (Vascular Surgery) Hilarie Fredrickson, MD as Consulting Physician (Gastroenterology)  This Provider for this visit: Treatment Team:  Attending Provider: Kalman Shan, MD    08/29/2023 -   Chief Complaint  Patient presents with   Follow-up  test.-> discussed with him 08/08/2023 - feels supportive care best. He has been in touch with them  #Elevated ESR greater since February 2024 and [facility] health medical records (according to primary care physician present for couple of years]   - No clear etiology known.  He in the past he has not responded to increase prednisone for polymyalgia rheumatica.  We will recheck this again today.  #History of SVT approximately atrial fibrillation April 2024 follows with Dr. Gaynelle Arabian  -Most recent visit was on 07/19/2023.  Noted to be on Toprol  #Elevated PSA 07/03/2023  - The been following with Dr. Modena Slater in [facility].  For the last 2 months he has now had a Foley catheter because of enlarged prostate they are not aware of a prostate cancer diagnosis.  I did reach out to Dr. Alvester Morin and have asked him to call me back but he has a chronic Foley for the last 2 months now.  No issues currently on this.   -Did discuss with Dr. Alvester Morin.  He does not think prostate cancer is a possibility.  Moreover with the age and no evidence of metastasis it is a nonissue.  He plans for IR embolization of the prostate to shrink it and have the Foley catheter come out.  #Physical deconditioning and fatigue   -His functional strength is improved he is able to transfer better.  He is able to sit stand.  He is  able to walk in the hallway or in the driveway with his walker.  However fatigue is significant.  The son took him to [employer] course and had him sit in the golf cart.  They watched for golf teams come by and after the patient got exhausted and had to go home despite eating a hot dog.   #g symptoms burden  -All significant and see below.  -I do not think the ILD is contributing to the symptom on the other hand is multifactorial and from anemia and high ESR and frailty and cachexia.  -His appetite is diminished but he is able to eat and not gaining weight though.  #Decub ulcer  -Being followed by wound care   #Goals of care  -Remains full code.  They have hesitated on undergoing procedures because of risk and bone marrow biopsy because of pain.   I did indicate to them that sometimes risk might have to be taken as long it is not undue to undergo certain procedures in order to establish clarity in the prognosis and diagnosis.  If reversible etiologies are found then he can improve but if your reversible etiologies are found then at least we have clarity.  They are processing this information. Ultimtely risk v beneift to be dcided by them/patoient an dproceduralists in shared decisin maknig         OV 08/29/2023  Subjective:  Patient ID: [name], male , DOB: 12-May-1930 , age 89 y.o. , MRN: [id number] , ADDRESS: [address] [place] [address] PCP [name], MD Patient Care Team: [name], MD as PCP - General (Internal Medicine) Little Ishikawa, MD as PCP - Cardiology (Cardiology) Holli Humbles, MD as Referring Physician (Ophthalmology) Pryor Ochoa, MD (Inactive) as Consulting Physician (Vascular Surgery) Hilarie Fredrickson, MD as Consulting Physician (Gastroenterology)  This Provider for this visit: Treatment Team:  Attending Provider: Kalman Shan, MD    08/29/2023 -   Chief Complaint  Patient presents with   Follow-up  and he was caring about his activities of daily living.   Has chronic history of polymyalgia rheumatica and has been on low-dose steroids for a few decades.  He has had persistently elevated ESR at least between 70-100 for the last 2 years according to PCP.  At some point because of possible PMR flare he was subjected increased prednisone dose but he always had this fatigue.   Review of the records indicate that starting February 2024 he started having leukocytosis and new onset anemia.  He did have an admission in the hospital at the time for fever.  Subsequently had extensive workup in the spring 2024 at [facility].  He had an admission for this.  They have an considered whether he might have aortitis.  This was in mid March 2024.  He was then treated with daptomycin in the hospital.  He was discharged in early April 2024 with a PICC line but shortly after that at home he collapsed.  Was diagnosed with submassive PE and interstitial pneumonitis.  He had no evidence of RV strain on the echo but his RV-LV ratio was 1.14.  Is on high flow oxygen.  Was given high-dose steroids.  During this time had ICU delirium including according to both primary care physician and D Vandam the infectious disease physician that I spoke to neuro suicidal ideations.  He was then discharged to [facility] where he spent 9 weeks and finally got home on 05/31/2023.  He is currently undergoing intense physical therapy at home.  During all this his high flow oxygen needs have improved he is such that he is only on 1-2 L nasal  cannula at rest.  The caretaker states that sometimes even at rest he is got normal pulse ox and he can go several hours without using oxygen.  He is able to get around with a walker but when he walks a certain distance such as 30 to 40 feet he will get extremely tired.   Overall the wife initially said that he was not any better but they did admit that he is now more conditioned.  He is also having reduced oxygen needs but the fatigue is the main component.  In talking to primary care physician as well fatigue seems to be the biggest component.  In fact a few days ago primary care physician called him in and had his hemoglobin checked it was 7 g%.  He just finished 2 units of transfusion.  He is somewhat better but still feeling extremely fatigued.  His leukocytosis continues and wife is frustrated about this and feels this might be associated with the fatigue.  In terms of his PMR and ILD his current prednisone treatment is at 15 mg/day.  This was slowly tapered since his hospitalization in April 2024.  Dr. Particia Jasper had instructed him to take 15 mg/day.  This was a clinical taper.  Despite different doses of prednisone his ESR is always continue to be high and the reason for this is not known.  In terms of pulmonary embolism he continues his Eliquis low dose His last echocardiogram was in April 2024.  His EF is 55 to 60% and had no evidence of RV strain.   OV 07/10/2023  Subjective:  Patient ID: [name], male , DOB: Sep 25, 1930 , age 45 y.o. , MRN: [id number] , ADDRESS: [address] [place] [address] PCP [name], MD Patient Care Team: [name], MD as PCP - General (Internal Medicine)  able to walk in the hallway or in the driveway with his walker.  However fatigue is significant.  The family did concede this is somewhat better.  [place] symptom assessment score is improved [see below].  #GI symptoms -Low appetite continues but is somewhat better - Nausea this continues to be a problem.  There is some better perhaps.  The amount of Zofran the using is less.  But is still a significant problem.  Wife thinks is associated with acid reflux.   #Decub ulcer  -Being followed by wound care   #Goals of care  -Remains full code.    [place] Symptom Assessment Numerical  Scale 0 is no problem -> 10 worst problem 08/08/2023   08/29/2023   No Pain -> Worst pain 5 0  No Tiredness -> Worset tiredness 8 7  No Nausea -> Worst nausea 7 6  No Depression -> Worst depression 5 7  No Anxiety -> Worst Anxiety 6 6  No Drowsiness -> Worst Drowsiness 0 5  Best appetite-> Worst Appetitle 5 4  Best Feeling of well being -> Worst feeling 6 3  No dyspnea-> Worst dyspnea 1 4  Other problem (none -> severe) x x  Completed by  Care guver ad patietn Care giver         CT Chest data from date: SPET 2024  - personally visualized and independently interpreted :YES - my findings are: MILD ild AT BASE   Simple office walk 224 (66+46 x 2) feet Pod A at [facility] x  3 laps goal with forehead probe 08/08/2023  08/29/2023   O2 used ra ra  Number laps completed Sit stand x 5  Sitting in wheel chair  Comments about pace Slow but can do   Resting Pulse Ox/HR 100% and 125/min 97% RA At rest  Final Pulse Ox/HR 100% and 134/min   Desaturated </= 88% no   Desaturated <= 3% points no   Got Tachycardic >/= 90/min yes   Symptoms at end of test x   Miscellaneous comments tachycardia      LAB RESULTS last 96 hours ECHOCARDIOGRAM COMPLETE  Result Date: 08/28/2023    ECHOCARDIOGRAM REPORT   Patient Name:   [name] Date of Exam: 08/28/2023 Medical Rec #:  [id number]      Height:       73.0 in Accession #:    [id number]     Weight:       148.0 lb Date of Birth:  [birth year]     BSA:          1.893 m Patient Age:    89 years       BP:           124/70 mmHg Patient Gender: M              HR:           91 bpm. Exam Location:  [address] Procedure: 2D Echo, 3D Echo, Cardiac Doppler and Color Doppler Indications:    R06.00 Dyspnea  History:        Patient has prior history of Echocardiogram examinations, most                 recent 03/06/2023. Abnormal ECG, PAD, Signs/Symptoms:Dyspnea; Risk                 Factors:Family History of Coronary Artery Disease. Aortic  able to walk in the hallway or in the driveway with his walker.  However fatigue is significant.  The family did concede this is somewhat better.  [place] symptom assessment score is improved [see below].  #GI symptoms -Low appetite continues but is somewhat better - Nausea this continues to be a problem.  There is some better perhaps.  The amount of Zofran the using is less.  But is still a significant problem.  Wife thinks is associated with acid reflux.   #Decub ulcer  -Being followed by wound care   #Goals of care  -Remains full code.    [place] Symptom Assessment Numerical  Scale 0 is no problem -> 10 worst problem 08/08/2023   08/29/2023   No Pain -> Worst pain 5 0  No Tiredness -> Worset tiredness 8 7  No Nausea -> Worst nausea 7 6  No Depression -> Worst depression 5 7  No Anxiety -> Worst Anxiety 6 6  No Drowsiness -> Worst Drowsiness 0 5  Best appetite-> Worst Appetitle 5 4  Best Feeling of well being -> Worst feeling 6 3  No dyspnea-> Worst dyspnea 1 4  Other problem (none -> severe) x x  Completed by  Care guver ad patietn Care giver         CT Chest data from date: SPET 2024  - personally visualized and independently interpreted :YES - my findings are: MILD ild AT BASE   Simple office walk 224 (66+46 x 2) feet Pod A at [facility] x  3 laps goal with forehead probe 08/08/2023  08/29/2023   O2 used ra ra  Number laps completed Sit stand x 5  Sitting in wheel chair  Comments about pace Slow but can do   Resting Pulse Ox/HR 100% and 125/min 97% RA At rest  Final Pulse Ox/HR 100% and 134/min   Desaturated </= 88% no   Desaturated <= 3% points no   Got Tachycardic >/= 90/min yes   Symptoms at end of test x   Miscellaneous comments tachycardia      LAB RESULTS last 96 hours ECHOCARDIOGRAM COMPLETE  Result Date: 08/28/2023    ECHOCARDIOGRAM REPORT   Patient Name:   [name] Date of Exam: 08/28/2023 Medical Rec #:  [id number]      Height:       73.0 in Accession #:    [id number]     Weight:       148.0 lb Date of Birth:  [birth year]     BSA:          1.893 m Patient Age:    89 years       BP:           124/70 mmHg Patient Gender: M              HR:           91 bpm. Exam Location:  [address] Procedure: 2D Echo, 3D Echo, Cardiac Doppler and Color Doppler Indications:    R06.00 Dyspnea  History:        Patient has prior history of Echocardiogram examinations, most                 recent 03/06/2023. Abnormal ECG, PAD, Signs/Symptoms:Dyspnea; Risk                 Factors:Family History of Coronary Artery Disease. Aortic  able to walk in the hallway or in the driveway with his walker.  However fatigue is significant.  The family did concede this is somewhat better.  [place] symptom assessment score is improved [see below].  #GI symptoms -Low appetite continues but is somewhat better - Nausea this continues to be a problem.  There is some better perhaps.  The amount of Zofran the using is less.  But is still a significant problem.  Wife thinks is associated with acid reflux.   #Decub ulcer  -Being followed by wound care   #Goals of care  -Remains full code.    [place] Symptom Assessment Numerical  Scale 0 is no problem -> 10 worst problem 08/08/2023   08/29/2023   No Pain -> Worst pain 5 0  No Tiredness -> Worset tiredness 8 7  No Nausea -> Worst nausea 7 6  No Depression -> Worst depression 5 7  No Anxiety -> Worst Anxiety 6 6  No Drowsiness -> Worst Drowsiness 0 5  Best appetite-> Worst Appetitle 5 4  Best Feeling of well being -> Worst feeling 6 3  No dyspnea-> Worst dyspnea 1 4  Other problem (none -> severe) x x  Completed by  Care guver ad patietn Care giver         CT Chest data from date: SPET 2024  - personally visualized and independently interpreted :YES - my findings are: MILD ild AT BASE   Simple office walk 224 (66+46 x 2) feet Pod A at [facility] x  3 laps goal with forehead probe 08/08/2023  08/29/2023   O2 used ra ra  Number laps completed Sit stand x 5  Sitting in wheel chair  Comments about pace Slow but can do   Resting Pulse Ox/HR 100% and 125/min 97% RA At rest  Final Pulse Ox/HR 100% and 134/min   Desaturated </= 88% no   Desaturated <= 3% points no   Got Tachycardic >/= 90/min yes   Symptoms at end of test x   Miscellaneous comments tachycardia      LAB RESULTS last 96 hours ECHOCARDIOGRAM COMPLETE  Result Date: 08/28/2023    ECHOCARDIOGRAM REPORT   Patient Name:   [name] Date of Exam: 08/28/2023 Medical Rec #:  [id number]      Height:       73.0 in Accession #:    [id number]     Weight:       148.0 lb Date of Birth:  [birth year]     BSA:          1.893 m Patient Age:    89 years       BP:           124/70 mmHg Patient Gender: M              HR:           91 bpm. Exam Location:  [address] Procedure: 2D Echo, 3D Echo, Cardiac Doppler and Color Doppler Indications:    R06.00 Dyspnea  History:        Patient has prior history of Echocardiogram examinations, most                 recent 03/06/2023. Abnormal ECG, PAD, Signs/Symptoms:Dyspnea; Risk                 Factors:Family History of Coronary Artery Disease. Aortic  having bowel trouble at [facility] rehab for about a month with constipation. He has been started on aggressive bowel regimen and has required manual disimpaction per his wife. He had large explosive bowel movement in clinic today.    His prednisone was reduced to 20mg  daily at rehab due to concerns of intolerance of high dose therapy. He continues bactrim DS 1 tab 3 days per week.    He is working on walking with a walker, he walked 32 steps yesterday without assistance.    He has reduced appetite and is trying to drink protein supplements daily.     06/14/2023 - Interim hx Patient presents today for 1 month follow-up/acute office visit.  He was seen by Dr. Francine Graven on 05/01/2023 hospital follow-up due to respiratory failure in the setting of interstitial lung disease.  Patient has a history of Polymyalgia rheumatica on chronic prednisone.  Differential for respiratory failure includes eosinophilic pneumonia in the setting of daptomycin use versus progressive ILD process.  Patient has mild basilar reticulation on CT chest from March 2022.  Inflammatory workup has been negative.  Follow-up chest x-ray on 05/01/2023 showed patchy  bilateral interstitial and alveolar opacity bilaterally not significantly changed compared to prior chest x-ray from April 2024.    Accompanied by his wife and nursing aid today Breathing is about the same  He is on supplemental oxygen, for the most part he is using 1L oxygen at home  He is on 20mg  prednisone daily with Bactrim double strength 1 tablet 3 days/week for pneumocystitis prophylaxis.  Need to discuss prednisone taper if able    Patient had UTI symptoms (frequency/urgency, fever and confusion) on July 6. He had some air hunger at this time. No associated URI symptoms or cough. Prescribed abx Augmentin x 10 days and doxycycline x10 days. CXR done with Dr. Waynard Edwards, results are not accessible in Epic. He is doing better. He is more tired last several days. He is having some stomach/abdomen discomfort. He does struggle with constipation.  He had a small bowel movement today.  He has been compliant with stool softener.  Plan  - reduce prednisone to 15mg  per day  OV 07/04/2023 -transfer of care to Dr. Marchelle Gearing in the ILD center.  Subjective:  Patient ID: [name], male , DOB: [birth year] , age 89 y.o. , MRN: [id number] , ADDRESS: [address] [place] [address] PCP [name], MD Patient Care Team: [name], MD as PCP - General (Internal Medicine) Little Ishikawa, MD as PCP - Cardiology (Cardiology) Holli Humbles, MD as Referring Physician (Ophthalmology) Pryor Ochoa, MD (Inactive) as Consulting Physician (Vascular Surgery) Hilarie Fredrickson, MD as Consulting Physician (Gastroenterology)  This Provider for this visit: Treatment Team:  Attending Provider: Kalman Shan, MD    07/04/2023 -   Chief Complaint  Patient presents with   Hospitalization Follow-up    New pt. And advise on health     HPI [name] 89 y.o. 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  Here with his wife Darl Pikes and also caretaker Bonita Quin.  History is provided by the  wife, caretaker, a little bit also from the patient also review of the external medical record.  All this was done on 07/03/2023.  Subsequent history also taken on 07/04/2023 the following day from [place].  Neither primary care physician.  The very complex story.  It appears that even at baseline he has had some mild atelectasis changes in his lower lung fields but this was essentially asymptomatic  Little Ishikawa, MD as PCP - Cardiology (Cardiology) Holli Humbles, MD as Referring Physician (Ophthalmology) Pryor Ochoa, MD (Inactive) as Consulting Physician (Vascular Surgery) Hilarie Fredrickson, MD as Consulting Physician (Gastroenterology)  This Provider for this  visit: Treatment Team:  Attending Provider: Kalman Shan, MD  Type of visit: Video Virtual Visit Identification of patient [name] with 04/06/1930 and MRN [id number] - 2 person identifier Risks: Risks, benefits, limitations of telephone visit explained. Patient understood and verbalized agreement to proceed Anyone else on call: His wife.  Also the caretaker.  He himself could not join Patient location: His home This provider location: [address]; [place]; [place] [address]. [facility]. [phone]    07/10/2023 -follow-up respiratory failure.   HPI KAIVEN VESTER 89 y.o. - In this video visit the purpose is to discuss CT scan results and then to catch up.  I personally visualized the CT scan over time and also showed it to the wife and the caretaker.  He has had some basal chronic ILD changes even 2022 in 2021.  It was extremely mild and could have been passed off as atelectasis.  He also had this in early 2024.  However clearly in April 2024 he had significant groundglass opacities and alveolar airspace filling.  Dense consolidation.  This could easily have been [place] with a high ESR but then his ESR is been chronically elevated for over 2 years according to PCP.  He has responded to steroids.  His lungs have improved and left with residual scar although the level of residual scar seems worse than his baseline.  There is currently very little any groundglass opacities currently on 15 mg prednisone since mid July 2024.  He is stable on 1 L nasal cannula occasionally 2 L according to caretaker.  We took a shared decision making to reduce to 10 mg/day [his baseline was 5 mg to-10 mg/day for his PMR].  Did indicate to the wife there is a small chance that things could flareup but overall this chronic toxicity to deal with with prednisone and therefore we took a shared decision making to reduce.  Regarding his other issues such as fatigue, anemia, other  reasons for elevated ESR he is seeing hematology.  I have also spoken infectious disease doctor.  If he has a bone marrow biopsy they will do cultures.  His QuantiFERON gold was indeterminate.  I spoke to Dr. Algis Liming he does not think patient has TB.  I agree with that.  His anemia also appears to have improved.    OV 08/08/2023  Subjective:  Patient ID: [name], male , DOB: 04/02/1930 , age 89 y.o. , MRN: [id number] , ADDRESS: [address] [place] [address] PCP [name], MD Patient Care Team: [name], MD as PCP - General (Internal Medicine) Little Ishikawa, MD as PCP - Cardiology (Cardiology) Holli Humbles, MD as Referring Physician (Ophthalmology) Pryor Ochoa, MD (Inactive) as Consulting Physician (Vascular Surgery) Hilarie Fredrickson, MD as Consulting Physician (Gastroenterology)  This Provider for this visit: Treatment Team:  Attending Provider: Kalman Shan, MD    08/08/2023 -   Chief Complaint  Patient presents with   Follow-up    F/up on ILD     HPI FINTAN GRATER 89 y.o. -last visit was in mid August 2024.  In the interim I did run into his son and wife outside the hospital at a social event.  They reported that  having bowel trouble at [facility] rehab for about a month with constipation. He has been started on aggressive bowel regimen and has required manual disimpaction per his wife. He had large explosive bowel movement in clinic today.    His prednisone was reduced to 20mg  daily at rehab due to concerns of intolerance of high dose therapy. He continues bactrim DS 1 tab 3 days per week.    He is working on walking with a walker, he walked 32 steps yesterday without assistance.    He has reduced appetite and is trying to drink protein supplements daily.     06/14/2023 - Interim hx Patient presents today for 1 month follow-up/acute office visit.  He was seen by Dr. Francine Graven on 05/01/2023 hospital follow-up due to respiratory failure in the setting of interstitial lung disease.  Patient has a history of Polymyalgia rheumatica on chronic prednisone.  Differential for respiratory failure includes eosinophilic pneumonia in the setting of daptomycin use versus progressive ILD process.  Patient has mild basilar reticulation on CT chest from March 2022.  Inflammatory workup has been negative.  Follow-up chest x-ray on 05/01/2023 showed patchy  bilateral interstitial and alveolar opacity bilaterally not significantly changed compared to prior chest x-ray from April 2024.    Accompanied by his wife and nursing aid today Breathing is about the same  He is on supplemental oxygen, for the most part he is using 1L oxygen at home  He is on 20mg  prednisone daily with Bactrim double strength 1 tablet 3 days/week for pneumocystitis prophylaxis.  Need to discuss prednisone taper if able    Patient had UTI symptoms (frequency/urgency, fever and confusion) on July 6. He had some air hunger at this time. No associated URI symptoms or cough. Prescribed abx Augmentin x 10 days and doxycycline x10 days. CXR done with Dr. Waynard Edwards, results are not accessible in Epic. He is doing better. He is more tired last several days. He is having some stomach/abdomen discomfort. He does struggle with constipation.  He had a small bowel movement today.  He has been compliant with stool softener.  Plan  - reduce prednisone to 15mg  per day  OV 07/04/2023 -transfer of care to Dr. Marchelle Gearing in the ILD center.  Subjective:  Patient ID: [name], male , DOB: [birth year] , age 89 y.o. , MRN: [id number] , ADDRESS: [address] [place] [address] PCP [name], MD Patient Care Team: [name], MD as PCP - General (Internal Medicine) Little Ishikawa, MD as PCP - Cardiology (Cardiology) Holli Humbles, MD as Referring Physician (Ophthalmology) Pryor Ochoa, MD (Inactive) as Consulting Physician (Vascular Surgery) Hilarie Fredrickson, MD as Consulting Physician (Gastroenterology)  This Provider for this visit: Treatment Team:  Attending Provider: Kalman Shan, MD    07/04/2023 -   Chief Complaint  Patient presents with   Hospitalization Follow-up    New pt. And advise on health     HPI [name] 89 y.o. 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  Here with his wife Darl Pikes and also caretaker Bonita Quin.  History is provided by the  wife, caretaker, a little bit also from the patient also review of the external medical record.  All this was done on 07/03/2023.  Subsequent history also taken on 07/04/2023 the following day from [facility].  Neither primary care physician.  The very complex story.  It appears that even at baseline he has had some mild atelectasis changes in his lower lung fields but this was essentially asymptomatic  having bowel trouble at [facility] rehab for about a month with constipation. He has been started on aggressive bowel regimen and has required manual disimpaction per his wife. He had large explosive bowel movement in clinic today.    His prednisone was reduced to 20mg  daily at rehab due to concerns of intolerance of high dose therapy. He continues bactrim DS 1 tab 3 days per week.    He is working on walking with a walker, he walked 32 steps yesterday without assistance.    He has reduced appetite and is trying to drink protein supplements daily.     06/14/2023 - Interim hx Patient presents today for 1 month follow-up/acute office visit.  He was seen by Dr. Francine Graven on 05/01/2023 hospital follow-up due to respiratory failure in the setting of interstitial lung disease.  Patient has a history of Polymyalgia rheumatica on chronic prednisone.  Differential for respiratory failure includes eosinophilic pneumonia in the setting of daptomycin use versus progressive ILD process.  Patient has mild basilar reticulation on CT chest from March 2022.  Inflammatory workup has been negative.  Follow-up chest x-ray on 05/01/2023 showed patchy  bilateral interstitial and alveolar opacity bilaterally not significantly changed compared to prior chest x-ray from April 2024.    Accompanied by his wife and nursing aid today Breathing is about the same  He is on supplemental oxygen, for the most part he is using 1L oxygen at home  He is on 20mg  prednisone daily with Bactrim double strength 1 tablet 3 days/week for pneumocystitis prophylaxis.  Need to discuss prednisone taper if able    Patient had UTI symptoms (frequency/urgency, fever and confusion) on July 6. He had some air hunger at this time. No associated URI symptoms or cough. Prescribed abx Augmentin x 10 days and doxycycline x10 days. CXR done with Dr. Waynard Edwards, results are not accessible in Epic. He is doing better. He is more tired last several days. He is having some stomach/abdomen discomfort. He does struggle with constipation.  He had a small bowel movement today.  He has been compliant with stool softener.  Plan  - reduce prednisone to 15mg  per day  OV 07/04/2023 -transfer of care to Dr. Marchelle Gearing in the ILD center.  Subjective:  Patient ID: [name], male , DOB: [birth year] , age 89 y.o. , MRN: [id number] , ADDRESS: [address] [place] [address] PCP [name], MD Patient Care Team: [name], MD as PCP - General (Internal Medicine) Little Ishikawa, MD as PCP - Cardiology (Cardiology) Holli Humbles, MD as Referring Physician (Ophthalmology) Pryor Ochoa, MD (Inactive) as Consulting Physician (Vascular Surgery) Hilarie Fredrickson, MD as Consulting Physician (Gastroenterology)  This Provider for this visit: Treatment Team:  Attending Provider: Kalman Shan, MD    07/04/2023 -   Chief Complaint  Patient presents with   Hospitalization Follow-up    New pt. And advise on health     HPI [name] 89 y.o. 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  Here with his wife Darl Pikes and also caretaker Bonita Quin.  History is provided by the  wife, caretaker, a little bit also from the patient also review of the external medical record.  All this was done on 07/03/2023.  Subsequent history also taken on 07/04/2023 the following day from [facility].  Neither primary care physician.  The very complex story.  It appears that even at baseline he has had some mild atelectasis changes in his lower lung fields but this was essentially asymptomatic  test.-> discussed with him 08/08/2023 - feels supportive care best. He has been in touch with them  #Elevated ESR greater since February 2024 and [facility] health medical records (according to primary care physician present for couple of years]   - No clear etiology known.  He in the past he has not responded to increase prednisone for polymyalgia rheumatica.  We will recheck this again today.  #History of SVT approximately atrial fibrillation April 2024 follows with Dr. Gaynelle Arabian  -Most recent visit was on 07/19/2023.  Noted to be on Toprol  #Elevated PSA 07/03/2023  - The been following with Dr. Modena Sl[facility] in [facility].  For the last 2 months he has now had a Foley catheter because of enlarged prostate they are not aware of a prostate cancer diagnosis.  I did reach out to Dr. Alvester Morin and have asked him to call me back but he has a chronic Foley for the last 2 months now.  No issues currently on this.   -Did discuss with Dr. Alvester Morin.  He does not think prostate cancer is a possibility.  Moreover with the age and no evidence of metastasis it is a nonissue.  He plans for IR embolization of the prostate to shrink it and have the Foley catheter come out.  #Physical deconditioning and fatigue   -His functional strength is improved he is able to transfer better.  He is able to sit stand.  He is  able to walk in the hallway or in the driveway with his walker.  However fatigue is significant.  The son took him to [employer] course and had him sit in the golf cart.  They watched for golf teams come by and after the patient got exhausted and had to go home despite eating a hot dog.   #g symptoms burden  -All significant and see below.  -I do not think the ILD is contributing to the symptom on the other hand is multifactorial and from anemia and high ESR and frailty and cachexia.  -His appetite is diminished but he is able to eat and not gaining weight though.  #Decub ulcer  -Being followed by wound care   #Goals of care  -Remains full code.  They have hesitated on undergoing procedures because of risk and bone marrow biopsy because of pain.   I did indicate to them that sometimes risk might have to be taken as long it is not undue to undergo certain procedures in order to establish clarity in the prognosis and diagnosis.  If reversible etiologies are found then he can improve but if your reversible etiologies are found then at least we have clarity.  They are processing this information. Ultimtely risk v beneift to be dcided by them/patoient an dproceduralists in shared decisin maknig         OV 08/29/2023  Subjective:  Patient ID: [name], male , DOB: 12-May-1930 , age 89 y.o. , MRN: [id number] , ADDRESS: [address] [place] [address] PCP [name], MD Patient Care Team: [name], MD as PCP - General (Internal Medicine) Little Ishikawa, MD as PCP - Cardiology (Cardiology) Holli Humbles, MD as Referring Physician (Ophthalmology) Pryor Ochoa, MD (Inactive) as Consulting Physician (Vascular Surgery) Hilarie Fredrickson, MD as Consulting Physician (Gastroenterology)  This Provider for this visit: Treatment Team:  Attending Provider: Kalman Shan, MD    08/29/2023 -   Chief Complaint  Patient presents with   Follow-up  and he was caring about his activities of daily living.   Has chronic history of polymyalgia rheumatica and has been on low-dose steroids for a few decades.  He has had persistently elevated ESR at least between 70-100 for the last 2 years according to PCP.  At some point because of possible PMR flare he was subjected increased prednisone dose but he always had this fatigue.   Review of the records indicate that starting February 2024 he started having leukocytosis and new onset anemia.  He did have an admission in the hospital at the time for fever.  Subsequently had extensive workup in the spring 2024 at [facility].  He had an admission for this.  They have an considered whether he might have aortitis.  This was in mid March 2024.  He was then treated with daptomycin in the hospital.  He was discharged in early April 2024 with a PICC line but shortly after that at home he collapsed.  Was diagnosed with submassive PE and interstitial pneumonitis.  He had no evidence of RV strain on the echo but his RV-LV ratio was 1.14.  Is on high flow oxygen.  Was given high-dose steroids.  During this time had ICU delirium including according to both primary care physician and D Vandam the infectious disease physician that I spoke to neuro suicidal ideations.  He was then discharged to [facility] where he spent 9 weeks and finally got home on 05/31/2023.  He is currently undergoing intense physical therapy at home.  During all this his high flow oxygen needs have improved he is such that he is only on 1-2 L nasal  cannula at rest.  The caretaker states that sometimes even at rest he is got normal pulse ox and he can go several hours without using oxygen.  He is able to get around with a walker but when he walks a certain distance such as 30 to 40 feet he will get extremely tired.   Overall the wife initially said that he was not any better but they did admit that he is now more conditioned.  He is also having reduced oxygen needs but the fatigue is the main component.  In talking to primary care physician as well fatigue seems to be the biggest component.  In fact a few days ago primary care physician called him in and had his hemoglobin checked it was 7 g%.  He just finished 2 units of transfusion.  He is somewhat better but still feeling extremely fatigued.  His leukocytosis continues and wife is frustrated about this and feels this might be associated with the fatigue.  In terms of his PMR and ILD his current prednisone treatment is at 15 mg/day.  This was slowly tapered since his hospitalization in April 2024.  Dr. Particia Jasper had instructed him to take 15 mg/day.  This was a clinical taper.  Despite different doses of prednisone his ESR is always continue to be high and the reason for this is not known.  In terms of pulmonary embolism he continues his Eliquis low dose His last echocardiogram was in April 2024.  His EF is 55 to 60% and had no evidence of RV strain.   OV 07/10/2023  Subjective:  Patient ID: [name], male , DOB: Sep 25, 1930 , age 45 y.o. , MRN: [id number] , ADDRESS: [address] [place] [address] PCP [name], MD Patient Care Team: [name], MD as PCP - General (Internal Medicine)  having bowel trouble at [facility] rehab for about a month with constipation. He has been started on aggressive bowel regimen and has required manual disimpaction per his wife. He had large explosive bowel movement in clinic today.    His prednisone was reduced to 20mg  daily at rehab due to concerns of intolerance of high dose therapy. He continues bactrim DS 1 tab 3 days per week.    He is working on walking with a walker, he walked 32 steps yesterday without assistance.    He has reduced appetite and is trying to drink protein supplements daily.     06/14/2023 - Interim hx Patient presents today for 1 month follow-up/acute office visit.  He was seen by Dr. Francine Graven on 05/01/2023 hospital follow-up due to respiratory failure in the setting of interstitial lung disease.  Patient has a history of Polymyalgia rheumatica on chronic prednisone.  Differential for respiratory failure includes eosinophilic pneumonia in the setting of daptomycin use versus progressive ILD process.  Patient has mild basilar reticulation on CT chest from March 2022.  Inflammatory workup has been negative.  Follow-up chest x-ray on 05/01/2023 showed patchy  bilateral interstitial and alveolar opacity bilaterally not significantly changed compared to prior chest x-ray from April 2024.    Accompanied by his wife and nursing aid today Breathing is about the same  He is on supplemental oxygen, for the most part he is using 1L oxygen at home  He is on 20mg  prednisone daily with Bactrim double strength 1 tablet 3 days/week for pneumocystitis prophylaxis.  Need to discuss prednisone taper if able    Patient had UTI symptoms (frequency/urgency, fever and confusion) on July 6. He had some air hunger at this time. No associated URI symptoms or cough. Prescribed abx Augmentin x 10 days and doxycycline x10 days. CXR done with Dr. Waynard Edwards, results are not accessible in Epic. He is doing better. He is more tired last several days. He is having some stomach/abdomen discomfort. He does struggle with constipation.  He had a small bowel movement today.  He has been compliant with stool softener.  Plan  - reduce prednisone to 15mg  per day  OV 07/04/2023 -transfer of care to Dr. Marchelle Gearing in the ILD center.  Subjective:  Patient ID: [name], male , DOB: [birth year] , age 89 y.o. , MRN: [id number] , ADDRESS: [address] [place] [address] PCP [name], MD Patient Care Team: [name], MD as PCP - General (Internal Medicine) Little Ishikawa, MD as PCP - Cardiology (Cardiology) Holli Humbles, MD as Referring Physician (Ophthalmology) Pryor Ochoa, MD (Inactive) as Consulting Physician (Vascular Surgery) Hilarie Fredrickson, MD as Consulting Physician (Gastroenterology)  This Provider for this visit: Treatment Team:  Attending Provider: Kalman Shan, MD    07/04/2023 -   Chief Complaint  Patient presents with   Hospitalization Follow-up    New pt. And advise on health     HPI [name] 89 y.o. 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  Here with his wife Darl Pikes and also caretaker Bonita Quin.  History is provided by the  wife, caretaker, a little bit also from the patient also review of the external medical record.  All this was done on 07/03/2023.  Subsequent history also taken on 07/04/2023 the following day from [facility].  Neither primary care physician.  The very complex story.  It appears that even at baseline he has had some mild atelectasis changes in his lower lung fields but this was essentially asymptomatic  test.-> discussed with him 08/08/2023 - feels supportive care best. He has been in touch with them  #Elevated ESR greater since February 2024 and [facility] health medical records (according to primary care physician present for couple of years]   - No clear etiology known.  He in the past he has not responded to increase prednisone for polymyalgia rheumatica.  We will recheck this again today.  #History of SVT approximately atrial fibrillation April 2024 follows with Dr. Gaynelle Arabian  -Most recent visit was on 07/19/2023.  Noted to be on Toprol  #Elevated PSA 07/03/2023  - The been following with Dr. Modena Slater in [facility].  For the last 2 months he has now had a Foley catheter because of enlarged prostate they are not aware of a prostate cancer diagnosis.  I did reach out to Dr. Alvester Morin and have asked him to call me back but he has a chronic Foley for the last 2 months now.  No issues currently on this.   -Did discuss with Dr. Alvester Morin.  He does not think prostate cancer is a possibility.  Moreover with the age and no evidence of metastasis it is a nonissue.  He plans for IR embolization of the prostate to shrink it and have the Foley catheter come out.  #Physical deconditioning and fatigue   -His functional strength is improved he is able to transfer better.  He is able to sit stand.  He is  able to walk in the hallway or in the driveway with his walker.  However fatigue is significant.  The son took him to [employer] course and had him sit in the golf cart.  They watched for golf teams come by and after the patient got exhausted and had to go home despite eating a hot dog.   #g symptoms burden  -All significant and see below.  -I do not think the ILD is contributing to the symptom on the other hand is multifactorial and from anemia and high ESR and frailty and cachexia.  -His appetite is diminished but he is able to eat and not gaining weight though.  #Decub ulcer  -Being followed by wound care   #Goals of care  -Remains full code.  They have hesitated on undergoing procedures because of risk and bone marrow biopsy because of pain.   I did indicate to them that sometimes risk might have to be taken as long it is not undue to undergo certain procedures in order to establish clarity in the prognosis and diagnosis.  If reversible etiologies are found then he can improve but if your reversible etiologies are found then at least we have clarity.  They are processing this information. Ultimtely risk v beneift to be dcided by them/patoient an dproceduralists in shared decisin maknig         OV 08/29/2023  Subjective:  Patient ID: [name], male , DOB: 12-May-1930 , age 89 y.o. , MRN: [id number] , ADDRESS: [address] [place] [address] PCP [name], MD Patient Care Team: [name], MD as PCP - General (Internal Medicine) Little Ishikawa, MD as PCP - Cardiology (Cardiology) Holli Humbles, MD as Referring Physician (Ophthalmology) Pryor Ochoa, MD (Inactive) as Consulting Physician (Vascular Surgery) Hilarie Fredrickson, MD as Consulting Physician (Gastroenterology)  This Provider for this visit: Treatment Team:  Attending Provider: Kalman Shan, MD    08/29/2023 -   Chief Complaint  Patient presents with   Follow-up  able to walk in the hallway or in the driveway with his walker.  However fatigue is significant.  The family did concede this is somewhat better.  [place] symptom assessment score is improved [see below].  #GI symptoms -Low appetite continues but is somewhat better - Nausea this continues to be a problem.  There is some better perhaps.  The amount of Zofran the using is less.  But is still a significant problem.  Wife thinks is associated with acid reflux.   #Decub ulcer  -Being followed by wound care   #Goals of care  -Remains full code.    [place] Symptom Assessment Numerical  Scale 0 is no problem -> 10 worst problem 08/08/2023   08/29/2023   No Pain -> Worst pain 5 0  No Tiredness -> Worset tiredness 8 7  No Nausea -> Worst nausea 7 6  No Depression -> Worst depression 5 7  No Anxiety -> Worst Anxiety 6 6  No Drowsiness -> Worst Drowsiness 0 5  Best appetite-> Worst Appetitle 5 4  Best Feeling of well being -> Worst feeling 6 3  No dyspnea-> Worst dyspnea 1 4  Other problem (none -> severe) x x  Completed by  Care guver ad patietn Care giver         CT Chest data from date: SPET 2024  - personally visualized and independently interpreted :YES - my findings are: MILD ild AT BASE   Simple office walk 224 (66+46 x 2) feet Pod A at [facility] x  3 laps goal with forehead probe 08/08/2023  08/29/2023   O2 used ra ra  Number laps completed Sit stand x 5  Sitting in wheel chair  Comments about pace Slow but can do   Resting Pulse Ox/HR 100% and 125/min 97% RA At rest  Final Pulse Ox/HR 100% and 134/min   Desaturated </= 88% no   Desaturated <= 3% points no   Got Tachycardic >/= 90/min yes   Symptoms at end of test x   Miscellaneous comments tachycardia      LAB RESULTS last 96 hours ECHOCARDIOGRAM COMPLETE  Result Date: 08/28/2023    ECHOCARDIOGRAM REPORT   Patient Name:   [name] Date of Exam: 08/28/2023 Medical Rec #:  [id number]      Height:       73.0 in Accession #:    [id number]     Weight:       148.0 lb Date of Birth:  [birth year]     BSA:          1.893 m Patient Age:    89 years       BP:           124/70 mmHg Patient Gender: M              HR:           91 bpm. Exam Location:  [address] Procedure: 2D Echo, 3D Echo, Cardiac Doppler and Color Doppler Indications:    R06.00 Dyspnea  History:        Patient has prior history of Echocardiogram examinations, most                 recent 03/06/2023. Abnormal ECG, PAD, Signs/Symptoms:Dyspnea; Risk                 Factors:Family History of Coronary Artery Disease. Aortic  test.-> discussed with him 08/08/2023 - feels supportive care best. He has been in touch with them  #Elevated ESR greater since February 2024 and [facility] health medical records (according to primary care physician present for couple of years]   - No clear etiology known.  He in the past he has not responded to increase prednisone for polymyalgia rheumatica.  We will recheck this again today.  #History of SVT approximately atrial fibrillation April 2024 follows with Dr. Gaynelle Arabian  -Most recent visit was on 07/19/2023.  Noted to be on Toprol  #Elevated PSA 07/03/2023  - The been following with Dr. Modena Slater in [facility].  For the last 2 months he has now had a Foley catheter because of enlarged prostate they are not aware of a prostate cancer diagnosis.  I did reach out to Dr. Alvester Morin and have asked him to call me back but he has a chronic Foley for the last 2 months now.  No issues currently on this.   -Did discuss with Dr. Alvester Morin.  He does not think prostate cancer is a possibility.  Moreover with the age and no evidence of metastasis it is a nonissue.  He plans for IR embolization of the prostate to shrink it and have the Foley catheter come out.  #Physical deconditioning and fatigue   -His functional strength is improved he is able to transfer better.  He is able to sit stand.  He is  able to walk in the hallway or in the driveway with his walker.  However fatigue is significant.  The son took him to [employer] course and had him sit in the golf cart.  They watched for golf teams come by and after the patient got exhausted and had to go home despite eating a hot dog.   #g symptoms burden  -All significant and see below.  -I do not think the ILD is contributing to the symptom on the other hand is multifactorial and from anemia and high ESR and frailty and cachexia.  -His appetite is diminished but he is able to eat and not gaining weight though.  #Decub ulcer  -Being followed by wound care   #Goals of care  -Remains full code.  They have hesitated on undergoing procedures because of risk and bone marrow biopsy because of pain.   I did indicate to them that sometimes risk might have to be taken as long it is not undue to undergo certain procedures in order to establish clarity in the prognosis and diagnosis.  If reversible etiologies are found then he can improve but if your reversible etiologies are found then at least we have clarity.  They are processing this information. Ultimtely risk v beneift to be dcided by them/patoient an dproceduralists in shared decisin maknig         OV 08/29/2023  Subjective:  Patient ID: [name], male , DOB: 12-May-1930 , age 89 y.o. , MRN: [id number] , ADDRESS: [address] [place] [address] PCP [name], MD Patient Care Team: [name], MD as PCP - General (Internal Medicine) Little Ishikawa, MD as PCP - Cardiology (Cardiology) Holli Humbles, MD as Referring Physician (Ophthalmology) Pryor Ochoa, MD (Inactive) as Consulting Physician (Vascular Surgery) Hilarie Fredrickson, MD as Consulting Physician (Gastroenterology)  This Provider for this visit: Treatment Team:  Attending Provider: Kalman Shan, MD    08/29/2023 -   Chief Complaint  Patient presents with   Follow-up  having bowel trouble at [facility] rehab for about a month with constipation. He has been started on aggressive bowel regimen and has required manual disimpaction per his wife. He had large explosive bowel movement in clinic today.    His prednisone was reduced to 20mg  daily at rehab due to concerns of intolerance of high dose therapy. He continues bactrim DS 1 tab 3 days per week.    He is working on walking with a walker, he walked 32 steps yesterday without assistance.    He has reduced appetite and is trying to drink protein supplements daily.     06/14/2023 - Interim hx Patient presents today for 1 month follow-up/acute office visit.  He was seen by Dr. Francine Graven on 05/01/2023 hospital follow-up due to respiratory failure in the setting of interstitial lung disease.  Patient has a history of Polymyalgia rheumatica on chronic prednisone.  Differential for respiratory failure includes eosinophilic pneumonia in the setting of daptomycin use versus progressive ILD process.  Patient has mild basilar reticulation on CT chest from March 2022.  Inflammatory workup has been negative.  Follow-up chest x-ray on 05/01/2023 showed patchy  bilateral interstitial and alveolar opacity bilaterally not significantly changed compared to prior chest x-ray from April 2024.    Accompanied by his wife and nursing aid today Breathing is about the same  He is on supplemental oxygen, for the most part he is using 1L oxygen at home  He is on 20mg  prednisone daily with Bactrim double strength 1 tablet 3 days/week for pneumocystitis prophylaxis.  Need to discuss prednisone taper if able    Patient had UTI symptoms (frequency/urgency, fever and confusion) on July 6. He had some air hunger at this time. No associated URI symptoms or cough. Prescribed abx Augmentin x 10 days and doxycycline x10 days. CXR done with Dr. Waynard Edwards, results are not accessible in Epic. He is doing better. He is more tired last several days. He is having some stomach/abdomen discomfort. He does struggle with constipation.  He had a small bowel movement today.  He has been compliant with stool softener.  Plan  - reduce prednisone to 15mg  per day  OV 07/04/2023 -transfer of care to Dr. Marchelle Gearing in the ILD center.  Subjective:  Patient ID: [name], male , DOB: [birth year] , age 89 y.o. , MRN: [id number] , ADDRESS: [address] [place] [address] PCP [name], MD Patient Care Team: [name], MD as PCP - General (Internal Medicine) Little Ishikawa, MD as PCP - Cardiology (Cardiology) Holli Humbles, MD as Referring Physician (Ophthalmology) Pryor Ochoa, MD (Inactive) as Consulting Physician (Vascular Surgery) Hilarie Fredrickson, MD as Consulting Physician (Gastroenterology)  This Provider for this visit: Treatment Team:  Attending Provider: Kalman Shan, MD    07/04/2023 -   Chief Complaint  Patient presents with   Hospitalization Follow-up    New pt. And advise on health     HPI [name] 89 y.o. 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  Here with his wife Darl Pikes and also caretaker Bonita Quin.  History is provided by the  wife, caretaker, a little bit also from the patient also review of the external medical record.  All this was done on 07/03/2023.  Subsequent history also taken on 07/04/2023 the following day from [place].  Neither primary care physician.  The very complex story.  It appears that even at baseline he has had some mild atelectasis changes in his lower lung fields but this was essentially asymptomatic  and he was caring about his activities of daily living.   Has chronic history of polymyalgia rheumatica and has been on low-dose steroids for a few decades.  He has had persistently elevated ESR at least between 70-100 for the last 2 years according to PCP.  At some point because of possible PMR flare he was subjected increased prednisone dose but he always had this fatigue.   Review of the records indicate that starting February 2024 he started having leukocytosis and new onset anemia.  He did have an admission in the hospital at the time for fever.  Subsequently had extensive workup in the spring 2024 at [facility].  He had an admission for this.  They have an considered whether he might have aortitis.  This was in mid March 2024.  He was then treated with daptomycin in the hospital.  He was discharged in early April 2024 with a PICC line but shortly after that at home he collapsed.  Was diagnosed with submassive PE and interstitial pneumonitis.  He had no evidence of RV strain on the echo but his RV-LV ratio was 1.14.  Is on high flow oxygen.  Was given high-dose steroids.  During this time had ICU delirium including according to both primary care physician and D Vandam the infectious disease physician that I spoke to neuro suicidal ideations.  He was then discharged to [facility] where he spent 9 weeks and finally got home on 05/31/2023.  He is currently undergoing intense physical therapy at home.  During all this his high flow oxygen needs have improved he is such that he is only on 1-2 L nasal  cannula at rest.  The caretaker states that sometimes even at rest he is got normal pulse ox and he can go several hours without using oxygen.  He is able to get around with a walker but when he walks a certain distance such as 30 to 40 feet he will get extremely tired.   Overall the wife initially said that he was not any better but they did admit that he is now more conditioned.  He is also having reduced oxygen needs but the fatigue is the main component.  In talking to primary care physician as well fatigue seems to be the biggest component.  In fact a few days ago primary care physician called him in and had his hemoglobin checked it was 7 g%.  He just finished 2 units of transfusion.  He is somewhat better but still feeling extremely fatigued.  His leukocytosis continues and wife is frustrated about this and feels this might be associated with the fatigue.  In terms of his PMR and ILD his current prednisone treatment is at 15 mg/day.  This was slowly tapered since his hospitalization in April 2024.  Dr. Particia Jasper had instructed him to take 15 mg/day.  This was a clinical taper.  Despite different doses of prednisone his ESR is always continue to be high and the reason for this is not known.  In terms of pulmonary embolism he continues his Eliquis low dose His last echocardiogram was in April 2024.  His EF is 55 to 60% and had no evidence of RV strain.   OV 07/10/2023  Subjective:  Patient ID: [name], male , DOB: Sep 25, 1930 , age 45 y.o. , MRN: [id number] , ADDRESS: [address] [place] [address] PCP [name], MD Patient Care Team: [name], MD as PCP - General (Internal Medicine)

## 2023-08-29 NOTE — Patient Instructions (Addendum)
ICD-10-CM   1. Interstitial lung disease (HCC)  J84.9     2. Physical deconditioning  R53.81      Interstitial lung disease (HCC) DOE  - you have inflammation in lung since 2021/2022: This flared up in April 2024 because of the PE and likely daptomycin. Possible covid last year 2023 played a role. Currently improved and needing room air to 1L at rest and night 1L at night. ([place]us[place] ox 97% RA at rest 08/29/2023)  -  HRCT aug 2024 with improvement from April 2024 but with inreased residual scar compared to baseline -> CT sept 2024 wit residual scar -> unable to reduce from 15mg  prednisone to 10mg /5mg  prednisone sept 2024    Plan  - continue prednisone to 15mg  per day and hold at this dose  - see if lungs flare up with lowerd prednisone - too high risk for lung biopsy but could consider BAL sometime in future - too high risk for anti-fibrotics  - increae o2 to 2L [place] at night and see if this helps fatigue  Pulmonary embolism April 2024; submassive with RV/LV ratio 1.14  -Currently on Eliquis and tolerating well but having ongoing anemia that predated pulmonary embolism - noted per PCP stool occult blood positive August 2024 and on low dose eliquis since then -ECHO Sept 2024: normal RV function and improved  Plan - contiue low dose eliquis (Given anemia and stool occult blood positive)  Resting tachycardia  - probably multifactoria. Dr Jerene Pitch following. ECHO normal Sept 2024  Plan  -per Dr Jerene Pitch  Elevated ESR x 2  year per PCP [name], MD reported 07/04/2023.  Indeterminatate QUantiferon Gold - 2024 x 2 Unclear cause.  - No headachees. - Doubt TB per oral conversation with Dr Synthia Innocent of [facility] Aug 2024 Persists despite improvement in lungs - DDx PMR, GCA and non-speicific  - 110 on 08/08/23 and unable to reduce prednisne below 10mg  per day.   Plan - continue prednisone 15mg  per day  - refer [facility] rehumatology rule out autoimmune - Bone marrow cultures might be  helpful  Anemia and leukocytosis- new onset February 2024 and ongoing as of August 2024  - S/p 2 units packed red blood cells early August 2024.  Per PCP [name], MD -stool occult blood positive late July/early August 2024. On Iron infusions as of 08/08/2023  - improved hgb to 10.4gm% on 08/08/23 - Dr Marina Goodell does not think endoscopy will be helpful in risk v benefit ratio  Plan - [facility] Dr Leonides Schanz f  -= support bone mariow biopsy if they decide to recommend it   Elevated PSA 07/03/2023 -follows with Dr Modena Slater On Foley since July/August 2024 - unlikely prostate cancer per DR Alvester Morin Sept 2024  Plan  - per DR Alvester Morin   Physical deconditioning Fatigue  This is multifactorial -and likely due to posthospitalization, steroid related myopathy, medical illnesses [anemia, age, pulmonary issues] and age-related sarcopenia made worse by medical illnesses.. Ongoing 08/08/2023 and significant . Currtly per history - able to transfer and walk drive way with walker and change clothes mostly indepdentnely and improved 08/29/2023 compared to a month ago  Plan  - continue PT - optimize anemia - per PCP [name], MD  Goals of care  - home palliative care following. Currently full code  Plan  - sometimes risk needs to be take (bone marrow biopsy or endoscopy) as long as is not undue to get clarity on what might be going on - ongoing per  PCP [name], MD    Followup  - face to face visit in  3 weeks or video visit ; 30 min to review

## 2023-08-31 ENCOUNTER — Other Ambulatory Visit: Payer: Self-pay | Admitting: Hematology and Oncology

## 2023-08-31 ENCOUNTER — Encounter: Payer: Self-pay | Admitting: Physician Assistant

## 2023-08-31 ENCOUNTER — Inpatient Hospital Stay: Payer: Medicare Other | Attending: Physician Assistant

## 2023-08-31 ENCOUNTER — Inpatient Hospital Stay (HOSPITAL_BASED_OUTPATIENT_CLINIC_OR_DEPARTMENT_OTHER): Payer: Medicare Other | Admitting: Hematology and Oncology

## 2023-08-31 VITALS — BP 108/80 | HR 129 | Temp 97.6°F | Resp 18 | Wt 148.4 lb

## 2023-08-31 DIAGNOSIS — D649 Anemia, unspecified: Secondary | ICD-10-CM | POA: Diagnosis not present

## 2023-08-31 DIAGNOSIS — Z7952 Long term (current) use of systemic steroids: Secondary | ICD-10-CM | POA: Insufficient documentation

## 2023-08-31 DIAGNOSIS — M79605 Pain in left leg: Secondary | ICD-10-CM | POA: Diagnosis not present

## 2023-08-31 DIAGNOSIS — D509 Iron deficiency anemia, unspecified: Secondary | ICD-10-CM

## 2023-08-31 DIAGNOSIS — D72829 Elevated white blood cell count, unspecified: Secondary | ICD-10-CM | POA: Diagnosis not present

## 2023-08-31 DIAGNOSIS — M25511 Pain in right shoulder: Secondary | ICD-10-CM | POA: Insufficient documentation

## 2023-08-31 DIAGNOSIS — Z806 Family history of leukemia: Secondary | ICD-10-CM | POA: Diagnosis not present

## 2023-08-31 DIAGNOSIS — D75839 Thrombocytosis, unspecified: Secondary | ICD-10-CM | POA: Diagnosis not present

## 2023-08-31 DIAGNOSIS — M545 Low back pain, unspecified: Secondary | ICD-10-CM | POA: Diagnosis not present

## 2023-08-31 DIAGNOSIS — M353 Polymyalgia rheumatica: Secondary | ICD-10-CM | POA: Diagnosis not present

## 2023-08-31 DIAGNOSIS — R269 Unspecified abnormalities of gait and mobility: Secondary | ICD-10-CM | POA: Diagnosis not present

## 2023-08-31 DIAGNOSIS — R5382 Chronic fatigue, unspecified: Secondary | ICD-10-CM | POA: Diagnosis not present

## 2023-08-31 LAB — CBC WITH DIFFERENTIAL (CANCER CENTER ONLY)
Abs Immature Granulocytes: 0.13 10*3/uL — ABNORMAL HIGH (ref 0.00–0.07)
Basophils Absolute: 0 10*3/uL (ref 0.0–0.1)
Basophils Relative: 0 %
Eosinophils Absolute: 0 10*3/uL (ref 0.0–0.5)
Eosinophils Relative: 0 %
HCT: 34.7 % — ABNORMAL LOW (ref 39.0–52.0)
Hemoglobin: 11.4 g/dL — ABNORMAL LOW (ref 13.0–17.0)
Immature Granulocytes: 1 %
Lymphocytes Relative: 5 %
Lymphs Abs: 0.8 10*3/uL (ref 0.7–4.0)
MCH: 29.5 pg (ref 26.0–34.0)
MCHC: 32.9 g/dL (ref 30.0–36.0)
MCV: 89.7 fL (ref 80.0–100.0)
Monocytes Absolute: 0.5 10*3/uL (ref 0.1–1.0)
Monocytes Relative: 3 %
Neutro Abs: 15.6 10*3/uL — ABNORMAL HIGH (ref 1.7–7.7)
Neutrophils Relative %: 91 %
Platelet Count: 357 10*3/uL (ref 150–400)
RBC: 3.87 MIL/uL — ABNORMAL LOW (ref 4.22–5.81)
RDW: 24.1 % — ABNORMAL HIGH (ref 11.5–15.5)
WBC Count: 17 10*3/uL — ABNORMAL HIGH (ref 4.0–10.5)
nRBC: 0 % (ref 0.0–0.2)

## 2023-08-31 LAB — CMP (CANCER CENTER ONLY)
ALT: 22 U/L (ref 0–44)
AST: 17 U/L (ref 15–41)
Albumin: 3.7 g/dL (ref 3.5–5.0)
Alkaline Phosphatase: 88 U/L (ref 38–126)
Anion gap: 6 (ref 5–15)
BUN: 47 mg/dL — ABNORMAL HIGH (ref 8–23)
CO2: 30 mmol/L (ref 22–32)
Calcium: 10.2 mg/dL (ref 8.9–10.3)
Chloride: 103 mmol/L (ref 98–111)
Creatinine: 0.94 mg/dL (ref 0.61–1.24)
GFR, Estimated: >60 mL/min (ref >60)
Glucose, Bld: 155 mg/dL — ABNORMAL HIGH (ref 70–99)
Potassium: 4.9 mmol/L (ref 3.5–5.1)
Sodium: 139 mmol/L (ref 135–145)
Total Bilirubin: 0.4 mg/dL (ref 0.3–1.2)
Total Protein: 7.1 g/dL (ref 6.5–8.1)

## 2023-08-31 LAB — RETIC PANEL
Immature Retic Fract: 12.7 % (ref 2.3–15.9)
RBC.: 4 MIL/uL — ABNORMAL LOW (ref 4.22–5.81)
Retic Count, Absolute: 65.2 10*3/uL (ref 19.0–186.0)
Retic Ct Pct: 1.6 % (ref 0.4–3.1)
Reticulocyte Hemoglobin: 33.9 pg (ref >27.9)

## 2023-08-31 LAB — FERRITIN: Ferritin: 217 ng/mL (ref 24–336)

## 2023-08-31 LAB — IRON AND IRON BINDING CAPACITY (CC-WL,HP ONLY)
Iron: 38 ug/dL — ABNORMAL LOW (ref 45–182)
Saturation Ratios: 16 % — ABNORMAL LOW (ref 17.9–39.5)
TIBC: 239 ug/dL — ABNORMAL LOW (ref 250–450)
UIBC: 201 ug/dL (ref 117–376)

## 2023-09-01 ENCOUNTER — Encounter (HOSPITAL_BASED_OUTPATIENT_CLINIC_OR_DEPARTMENT_OTHER): Payer: Medicare Other | Attending: General Surgery | Admitting: General Surgery

## 2023-09-01 DIAGNOSIS — Z86718 Personal history of other venous thrombosis and embolism: Secondary | ICD-10-CM | POA: Diagnosis not present

## 2023-09-01 DIAGNOSIS — M353 Polymyalgia rheumatica: Secondary | ICD-10-CM | POA: Diagnosis not present

## 2023-09-01 DIAGNOSIS — Z86711 Personal history of pulmonary embolism: Secondary | ICD-10-CM | POA: Diagnosis not present

## 2023-09-01 DIAGNOSIS — Z9981 Dependence on supplemental oxygen: Secondary | ICD-10-CM | POA: Diagnosis not present

## 2023-09-01 DIAGNOSIS — Z7952 Long term (current) use of systemic steroids: Secondary | ICD-10-CM | POA: Diagnosis not present

## 2023-09-01 DIAGNOSIS — M1990 Unspecified osteoarthritis, unspecified site: Secondary | ICD-10-CM | POA: Insufficient documentation

## 2023-09-01 DIAGNOSIS — L89153 Pressure ulcer of sacral region, stage 3: Secondary | ICD-10-CM | POA: Insufficient documentation

## 2023-09-01 DIAGNOSIS — I509 Heart failure, unspecified: Secondary | ICD-10-CM | POA: Insufficient documentation

## 2023-09-01 NOTE — Progress Notes (Signed)
[name] [name]: 7 Information Obtained From Patient Chart Eyes Medical History: Positive for: Cataracts - bil extractions Negative for: Glaucoma; Optic Neuritis Past Medical History Notes: retinal vein occlusion Ear/Nose/Mouth/Throat Medical History: Negative for: Chronic sinus problems/congestion; Middle ear problems Past Medical History Notes: seasonal allergies Hematologic/Lymphatic Medical History: Positive for: Anemia [name], [name] ([id number]) [id number] Page 6 of 7 Negative for: Hemophilia; Human Immunodeficiency Virus; Lymphedema; Sickle Cell Disease Respiratory Medical History: Negative for: Aspiration; Asthma; Chronic Obstructive Pulmonary Disease (COPD); Pneumothorax; Sleep Apnea; Tuberculosis Past Medical History Notes: interstitial lung disease, O2 dependent, hx PE Cardiovascular Medical  History: Positive for: Congestive Heart Failure; Deep Vein Thrombosis Negative for: Arrhythmia Past Medical History Notes: AAA, hyperlipidemia, aortitis, hx PNA, eosinophic pneumonia Endocrine Medical History: Negative for: Type I Diabetes; Type II Diabetes Genitourinary Medical History: Negative for: End Stage Renal Disease Past Medical History Notes: BPH, foley cath Integumentary (Skin) Medical History: Negative for: History of Burn Musculoskeletal Medical History: Positive for: Osteoarthritis Negative for: Gout; Rheumatoid Arthritis; Osteomyelitis Past Medical History Notes: polymyalgia rheumatica, degenerative disk dx, dupuytren'Alexander contracture Oncologic Medical History: Negative for: Received Chemotherapy; Received Radiation Psychiatric Medical History: Negative for: Anorexia/bulimia; Confinement Anxiety HBO Extended History Items Eyes: Cataracts Immunizations Pneumococcal Vaccine: Received Pneumococcal Vaccination: Yes Received Pneumococcal Vaccination On or After 60th Birthday: Yes Implantable Devices None Family and Social History Cancer: Yes - Siblings; Diabetes: No; Heart Disease: Yes - Mother; Hereditary Spherocytosis: No; Hypertension: No; Kidney Disease: No; Lung Disease: No; Seizures: No; Stroke: No; Thyroid Problems: No; Tuberculosis: No; Never smoker; Marital Status - Married; Alcohol Use: Rarely; Drug Use: No History; Caffeine Use: Rarely; Financial Concerns: No; Food, Clothing or Shelter Needs: No; Support System Lacking: No; Transportation Concerns: No Electronic Signature(Alexander) Signed: 09/01/2023 11:11:18 AM By: Randall Guess MD FACS Entered By: [name] on 09/01/2023 07:52:54 [name], [name] ([id number]) [id number] Page 7 of 7 -------------------------------------------------------------------------------- SuperBill Details Patient Name: Date of Service: [name], [name]. 09/01/2023 Medical Record Number:  [id number] Patient Account Number: [id number] Date of Birth/Sex: Treating RN: 1930-01-10 (89 y.o. [name] Primary Care Provider: Ezequiel Alexander Other Clinician: Referring Provider: Treating Provider/Extender: [name]: 7 Diagnosis Coding ICD-10 Codes Code Description [phone] Pressure ulcer of sacral region, stage 3 Z79.52 Long term (current) use of systemic steroids M35.3 Polymyalgia rheumatica Facility Procedures : CPT4 Code: [id number] Description: 99213 - WOUND CARE VISIT-LEV 3 EST PT Modifier: Quantity: 1 Physician Procedures : CPT4 Code Description Modifier [id number] 99213 - WC PHYS LEVEL 3 - EST PT ICD-10 Diagnosis Description L89.153 Pressure ulcer of sacral region, stage 3 Z79.52 Long term (current) use of systemic steroids M35.3 Polymyalgia rheumatica Quantity: 1 Electronic Signature(Alexander) Signed: 09/01/2023 10:59:14 AM By: Randall Guess MD FACS Entered By: [name] on 09/01/2023 07:59:14  [name], [name] ([id number]) [id number] Page 1 of 7 Visit Report for 09/01/2023 Chief Complaint Document Details Patient Name: Date of Service: [name], [name]. 09/01/2023 10:15 A M Medical Record Number: [id number] Patient Account Number: [id number] Date of Birth/Sex: Treating RN: 04-03-1930 (89 y.o. M) Primary Care Provider: Ezequiel Alexander Other Clinician: Referring Provider: Treating Provider/Extender: [name]: 7 Information Obtained from: Patient Chief Complaint Patient is at the clinic for treatment of an open pressure ulcer Electronic Signature(Alexander) Signed: 09/01/2023 10:52:31 AM By: Randall Guess MD FACS Entered By: [name] on 09/01/2023 07:52:30 -------------------------------------------------------------------------------- HPI Details Patient Name: Date of Service: [name], [name]. 09/01/2023 10:15 A M Medical Record Number: [id number] Patient Account Number: [id number] Date of Birth/Sex: Treating RN: [birth year] (89 y.o. M) Primary Care Provider: Ezequiel Alexander Other Clinician: Referring Provider: Treating Provider/Extender: [name]: 7 History of Present Illness HPI Description: ADMISSION 07/12/2023 This is a 89 year old nondiabetic with a history of polymyalgia rheumatica on chronic steroid therapy. He has had a number of recent hospitalizations for various reasons that resulted Alexander debility. After his most recent hospital stay, he was discharged to a skilled nursing facility and returned home about a month ago. One of the issues that multiple specialists have been investigating and has been an elevated sed rate and CRP, along with chronic elevation of his white blood cell count. No obvious infectious etiology appears to have been identified. Due to his limited mobility, he is spent a fair amount of time sitting and subsequently developed a stage III  sacral pressure ulcer. He does have a home health nurse/aide and his wife is very active Alexander his care. They have been applying hydrogel and a foam border dressing to the site. He has an air mattress and his wife has been encouraging him to avoid lying or sitting on the wound site. He was referred to the wound care center by Dr. Daiva Eves Alexander infectious disease for further evaluation and management of the sacral pressure ulcer. 08/04/2023: The sacral wound is nearly healed. The purpleish area that was suspicious for pressure-induced deep tissue injury is no longer present. There is a little bit of thin eschar on the wound surface, but it is quite dry and peeling away. 08/18/2023: The initial ulcer is actually healed, but there is a little bit of dry skin that peeled away leaving a very small superficial opening just lateral to the midline. 09/01/2023: His wound is healed. Electronic Signature(Alexander) Signed: 09/01/2023 10:52:48 AM By: Randall Guess MD FACS Entered By: [name] on 09/01/2023 07:52:48 [name], [name] ([id number]) [id number] Page 2 of 7 -------------------------------------------------------------------------------- Physical Exam Details Patient Name: Date of Service: [name], [name]. 09/01/2023 10:15 A M Medical Record Number: [id number] Patient Account Number: [id number] Date of Birth/Sex: Treating RN: 1930/07/26 (89 y.o. M) Primary Care Provider: Ezequiel Alexander Other Clinician: Referring Provider: Treating Provider/Extender: [name]: 7 Constitutional . . . . no acute distress. Respiratory Normal work of breathing on supplemental oxygen. Notes 09/01/2039: His wound is healed. Electronic Signature(Alexander) Signed: 09/01/2023 10:53:23 AM By: Randall Guess MD FACS Entered By: [name] on 09/01/2023 07:53:23 -------------------------------------------------------------------------------- Physician  Orders Details Patient Name: Date of Service: [name], [name]. 09/01/2023 10:15 A M Medical Record Number: [id number] Patient Account Number: [id number] Date of Birth/Sex: Treating RN: 1930/06/17 (89 y.o. [name] Primary Care Provider: Ezequiel Alexander Other Clinician: Referring Provider:  Porzio, [name] ([id number]) [id number] Page 1 of 7 Visit Report for 09/01/2023 Chief Complaint Document Details Patient Name: Date of Service: [name], [name]. 09/01/2023 10:15 A M Medical Record Number: [id number] Patient Account Number: [id number] Date of Birth/Sex: Treating RN: 04-03-1930 (89 y.o. M) Primary Care Provider: Ezequiel Alexander Other Clinician: Referring Provider: Treating Provider/Extender: [name]: 7 Information Obtained from: Patient Chief Complaint Patient is at the clinic for treatment of an open pressure ulcer Electronic Signature(Alexander) Signed: 09/01/2023 10:52:31 AM By: Randall Guess MD FACS Entered By: [name] on 09/01/2023 07:52:30 -------------------------------------------------------------------------------- HPI Details Patient Name: Date of Service: [name], [name]. 09/01/2023 10:15 A M Medical Record Number: [id number] Patient Account Number: [id number] Date of Birth/Sex: Treating RN: [birth year] (89 y.o. M) Primary Care Provider: Ezequiel Alexander Other Clinician: Referring Provider: Treating Provider/Extender: [name]: 7 History of Present Illness HPI Description: ADMISSION 07/12/2023 This is a 89 year old nondiabetic with a history of polymyalgia rheumatica on chronic steroid therapy. He has had a number of recent hospitalizations for various reasons that resulted Alexander debility. After his most recent hospital stay, he was discharged to a skilled nursing facility and returned home about a month ago. One of the issues that multiple specialists have been investigating and has been an elevated sed rate and CRP, along with chronic elevation of his white blood cell count. No obvious infectious etiology appears to have been identified. Due to his limited mobility, he is spent a fair amount of time sitting and subsequently developed a stage III  sacral pressure ulcer. He does have a home health nurse/aide and his wife is very active Alexander his care. They have been applying hydrogel and a foam border dressing to the site. He has an air mattress and his wife has been encouraging him to avoid lying or sitting on the wound site. He was referred to the wound care center by Dr. Daiva Eves Alexander infectious disease for further evaluation and management of the sacral pressure ulcer. 08/04/2023: The sacral wound is nearly healed. The purpleish area that was suspicious for pressure-induced deep tissue injury is no longer present. There is a little bit of thin eschar on the wound surface, but it is quite dry and peeling away. 08/18/2023: The initial ulcer is actually healed, but there is a little bit of dry skin that peeled away leaving a very small superficial opening just lateral to the midline. 09/01/2023: His wound is healed. Electronic Signature(Alexander) Signed: 09/01/2023 10:52:48 AM By: Randall Guess MD FACS Entered By: [name] on 09/01/2023 07:52:48 [name], [name] ([id number]) [id number] Page 2 of 7 -------------------------------------------------------------------------------- Physical Exam Details Patient Name: Date of Service: [name], [name]. 09/01/2023 10:15 A M Medical Record Number: [id number] Patient Account Number: [id number] Date of Birth/Sex: Treating RN: 1930/07/26 (89 y.o. M) Primary Care Provider: Ezequiel Alexander Other Clinician: Referring Provider: Treating Provider/Extender: [name]: 7 Constitutional . . . . no acute distress. Respiratory Normal work of breathing on supplemental oxygen. Notes 09/01/2039: His wound is healed. Electronic Signature(Alexander) Signed: 09/01/2023 10:53:23 AM By: Randall Guess MD FACS Entered By: [name] on 09/01/2023 07:53:23 -------------------------------------------------------------------------------- Physician  Orders Details Patient Name: Date of Service: [name], [name]. 09/01/2023 10:15 A M Medical Record Number: [id number] Patient Account Number: [id number] Date of Birth/Sex: Treating RN: 1930/06/17 (89 y.o. [name] Primary Care Provider: Ezequiel Alexander Other Clinician: Referring Provider:  midline. 09/01/2023: His wound is healed. Patient History Information obtained from Patient, Chart. Family History Cancer - Siblings, Heart Disease - Mother, No family history of Diabetes, Hereditary Spherocytosis, Hypertension, Kidney Disease, Lung Disease, Seizures, Stroke, Thyroid Problems, Tuberculosis. Social History Never smoker, Marital Status - Married, Alcohol Use - Rarely, Drug Use - No History, Caffeine Use - Rarely. Medical History Eyes Patient has history of Cataracts - bil extractions Denies history of Glaucoma, Optic Neuritis Ear/Nose/Mouth/Throat Denies history of Chronic sinus problems/congestion, Middle ear problems Hematologic/Lymphatic Patient has history of Anemia Denies history of Hemophilia, Human Immunodeficiency Virus, Lymphedema, Sickle Cell Disease Respiratory Denies history of Aspiration, Asthma, Chronic Obstructive Pulmonary Disease (COPD), Pneumothorax, Sleep Apnea, Tuberculosis Cardiovascular Patient has history of Congestive Heart Failure, Deep Vein Thrombosis Denies history of Arrhythmia Endocrine Denies history of Type I Diabetes, Type II Diabetes Genitourinary Denies history of End Stage Renal Disease Integumentary (Skin) Denies history of History of Burn Musculoskeletal Patient has history of Osteoarthritis Denies history of Gout, Rheumatoid Arthritis, Osteomyelitis Oncologic Denies history of Received Chemotherapy, Received  Radiation Psychiatric Denies history of Anorexia/bulimia, Confinement Anxiety Medical A Surgical History Notes nd Eyes retinal vein occlusion Ear/Nose/Mouth/Throat seasonal allergies Respiratory interstitial lung disease, O2 dependent, hx PE Cardiovascular AAA, hyperlipidemia, aortitis, hx PNA, eosinophic pneumonia Genitourinary BPH, foley cath Musculoskeletal polymyalgia rheumatica, degenerative disk dx, dupuytren'Alexander contracture Objective Constitutional no acute distress. Vitals Time Taken: 10:30 AM, Height: 72 Alexander, Weight: 144 lbs, BMI: 19.5, Temperature: 97.5 F, Pulse: 93 bpm, Respiratory Rate: 18 breaths/min, Blood Pressure: 115/81 mmHg. Respiratory Normal work of breathing on supplemental oxygen. General Notes: 09/01/2039: His wound is healed. Integumentary (Hair, Skin) Wound #1 status is Healed - Epithelialized. Original cause of wound was Pressure Injury. The date acquired was: 06/07/2023. The wound has been [name] 7 Alexander. The wound is located on the Sacrum. The wound measures 0cm length x 0cm width x 0cm depth; 0cm^2 area and 0cm^3 volume. There is no tunneling or undermining noted. There is a none present amount of drainage noted. There is no granulation within the wound bed. There is no necrotic tissue within the wound bed. The periwound skin appearance had no abnormalities noted for texture. The periwound skin appearance had no abnormalities noted for moisture. The Linganore, Evart Alexander ([id number]) [id number] Page 5 of 7 periwound skin appearance had no abnormalities noted for color. Periwound temperature was noted as No Abnormality. Assessment Active Problems ICD-10 Pressure ulcer of sacral region, stage 3 Long term (current) use of systemic steroids Polymyalgia rheumatica Plan Discharge From [facility] Services: Discharge from Wound Care Center Bathing/ Shower/ Hygiene: May shower and wash wound with soap and water. Off-Loading: Other: - air  mattress, avoid laying on back side, stand at least every hour during the day Additional Orders / Instructions: Follow Nutritious Diet - 70-100 gms of protein daily 09/01/2023: His wound is healed. I did recommend that they continue to offload and keep a sacral foam dressing on the area to protect it from breaking down again. We will discharge him from the wound care center. He may follow-up as needed. Electronic Signature(Alexander) Signed: 09/01/2023 10:58:52 AM By: Randall Guess MD FACS Previous Signature: 09/01/2023 10:57:49 AM Version By: Randall Guess MD FACS Entered By: [name] on 09/01/2023 07:58:51 -------------------------------------------------------------------------------- HxROS Details Patient Name: Date of Service: [name], Faizaan Alexander. 09/01/2023 10:15 A M Medical Record Number: [id number] Patient Account Number: [id number] Date of Birth/Sex: Treating RN: 23-Feb-1930 (89 y.o. M) Primary Care Provider: Ezequiel Alexander Other Clinician: Referring Provider: Treating Provider/Extender:

## 2023-09-04 DIAGNOSIS — E2740 Unspecified adrenocortical insufficiency: Secondary | ICD-10-CM | POA: Diagnosis not present

## 2023-09-04 DIAGNOSIS — M353 Polymyalgia rheumatica: Secondary | ICD-10-CM | POA: Diagnosis not present

## 2023-09-04 DIAGNOSIS — D72829 Elevated white blood cell count, unspecified: Secondary | ICD-10-CM | POA: Diagnosis not present

## 2023-09-04 DIAGNOSIS — L98429 Non-pressure chronic ulcer of back with unspecified severity: Secondary | ICD-10-CM | POA: Diagnosis not present

## 2023-09-04 DIAGNOSIS — K5900 Constipation, unspecified: Secondary | ICD-10-CM | POA: Diagnosis not present

## 2023-09-04 DIAGNOSIS — M79605 Pain in left leg: Secondary | ICD-10-CM | POA: Diagnosis not present

## 2023-09-04 DIAGNOSIS — N320 Bladder-neck obstruction: Secondary | ICD-10-CM | POA: Diagnosis not present

## 2023-09-04 DIAGNOSIS — M545 Low back pain, unspecified: Secondary | ICD-10-CM | POA: Diagnosis not present

## 2023-09-04 DIAGNOSIS — J9601 Acute respiratory failure with hypoxia: Secondary | ICD-10-CM | POA: Diagnosis not present

## 2023-09-04 DIAGNOSIS — R110 Nausea: Secondary | ICD-10-CM | POA: Diagnosis not present

## 2023-09-04 DIAGNOSIS — I480 Paroxysmal atrial fibrillation: Secondary | ICD-10-CM | POA: Diagnosis not present

## 2023-09-04 DIAGNOSIS — D638 Anemia in other chronic diseases classified elsewhere: Secondary | ICD-10-CM | POA: Diagnosis not present

## 2023-09-04 DIAGNOSIS — R269 Unspecified abnormalities of gait and mobility: Secondary | ICD-10-CM | POA: Diagnosis not present

## 2023-09-04 DIAGNOSIS — F0390 Unspecified dementia without behavioral disturbance: Secondary | ICD-10-CM | POA: Diagnosis not present

## 2023-09-04 DIAGNOSIS — R5382 Chronic fatigue, unspecified: Secondary | ICD-10-CM | POA: Diagnosis not present

## 2023-09-06 DIAGNOSIS — M545 Low back pain, unspecified: Secondary | ICD-10-CM | POA: Diagnosis not present

## 2023-09-06 DIAGNOSIS — F320 Major depressive disorder, single episode, mild: Secondary | ICD-10-CM | POA: Diagnosis not present

## 2023-09-06 DIAGNOSIS — M79605 Pain in left leg: Secondary | ICD-10-CM | POA: Diagnosis not present

## 2023-09-06 DIAGNOSIS — R269 Unspecified abnormalities of gait and mobility: Secondary | ICD-10-CM | POA: Diagnosis not present

## 2023-09-06 DIAGNOSIS — R5382 Chronic fatigue, unspecified: Secondary | ICD-10-CM | POA: Diagnosis not present

## 2023-09-07 NOTE — Progress Notes (Signed)
Randall [name], Randall [name] ([id number]) [id number] Page 1 of 7 Visit Report for 09/01/2023 Arrival Information Details Patient Name: Date of Service: Randall [name], Randall [name] [name]. 09/01/2023 10:15 A M Medical Record Number: [id number] Patient Account Number: [id number] Date of Birth/Sex: Treating RN: Jul 10, 1930 (89 y.o. Randall [name], Randall [name] Primary Care Randall [name]: Randall [name] Other Clinician: Referring Randall [name]: Treating Randall [name]/Extender: Randall [name] [name]: 7 Visit Information History Since Last Visit All ordered tests and consults were completed: No Patient Arrived: Wheel Chair Added or deleted any medications: No Arrival Time: 10:23 Any new allergies or adverse reactions: No Accompanied By: wife Had a fall or experienced change [name] No Transfer Assistance: None activities of daily living that may affect Patient Identification Verified: Yes risk of falls: Secondary Verification Process Completed: Yes Signs or symptoms of abuse/neglect since last visito No Patient Requires Transmission-Based Precautions: No Hospitalized since last visit: No Patient Has Alerts: No Implantable device outside of the clinic excluding No cellular tissue based products placed [name] the center since last visit: Has Dressing [name] Place as Prescribed: Yes Pain Present Now: No Electronic Signature([name]) Signed: 09/01/2023 12:22:13 PM By: [name] Entered By: [name] on 09/01/2023 10:29:02 -------------------------------------------------------------------------------- Clinic Level of Care Assessment Details Patient Name: Date of Service: Randall [name], Randall [name]. 09/01/2023 10:15 A M Medical Record Number: [id number] Patient Account Number: [id number] Date of Birth/Sex: Treating RN: [birth year] (89 y.o. Randall [name] Primary Care Randall [name]: Randall [name] Other Clinician: Referring Vanderbilt Ranieri: Treating Randall [name]/Extender: Randall [name] [name]: 7 Clinic  Level of Care Assessment Items TOOL 4 Quantity Score X- 1 0 Use when only an EandM is performed on FOLLOW-UP visit ASSESSMENTS - Nursing Assessment / Reassessment X- 1 10 Reassessment of Co-morbidities (includes updates [name] patient status) X- 1 5 Reassessment of Adherence to Treatment Plan ASSESSMENTS - Wound and Skin A ssessment / Reassessment X - Simple Wound Assessment / Reassessment - one wound 1 5 []  - 0 Complex Wound Assessment / Reassessment - multiple wounds []  - 0 Dermatologic / Skin Assessment (not related to wound area) ASSESSMENTS - Focused Assessment []  - 0 Circumferential Edema Measurements - multi extremities []  - 0 Nutritional Assessment / Counseling / Intervention Randall [name], Randall [name] ([id number]) [id number] Page 2 of 7 []  - 0 Lower Extremity Assessment (monofilament, tuning fork, pulses) []  - 0 Peripheral Arterial Disease Assessment (using hand held doppler) ASSESSMENTS - Ostomy and/or Continence Assessment and Care []  - 0 Incontinence Assessment and Management []  - 0 Ostomy Care Assessment and Management (repouching, etc.) PROCESS - Coordination of Care X - Simple Patient / Family Education for ongoing care 1 15 []  - 0 Complex (extensive) Patient / Family Education for ongoing care X- 1 10 Staff obtains Chiropractor, Records, T Results / Process Orders est []  - 0 Staff telephones HHA, Nursing Homes / Clarify orders / etc []  - 0 Routine Transfer to another Facility (non-emergent condition) []  - 0 Routine Hospital Admission (non-emergent condition) []  - 0 New Admissions / Manufacturing engineer / Ordering NPWT Apligraf, etc. , []  - 0 Emergency Hospital Admission (emergent condition) X- 1 10 Simple Discharge Coordination []  - 0 Complex (extensive) Discharge Coordination PROCESS - Special Needs []  - 0 Pediatric / Minor Patient Management []  - 0 Isolation Patient Management []  - 0 Hearing / Language / Visual special needs []  -  0 Assessment of Community assistance (transportation, D/C planning, etc.) []  - 0 Additional assistance / Altered mentation []  - 0 Support Surface([name]) Assessment (bed, cushion,  N/A Volume (cm) : 100.00% N/A N/A % Reduction [name] A rea: 100.00% N/A N/A % Reduction [name] Volume: Category/Stage III N/A N/A Classification: None Present N/A N/A Exudate A mount: None Present (0%) N/A N/A Granulation A mount: None Present (0%) N/A N/A Necrotic A mount: Fascia: No N/A N/A Exposed Structures: Fat Layer (Subcutaneous Tissue): No Tendon: No Muscle: No Joint: No Bone: No Large (67-100%) N/A N/A Epithelialization: No Abnormalities Noted N/A N/A Periwound Skin Texture: No Abnormalities Noted N/A N/A Periwound Skin Moisture: No Abnormalities Noted N/A N/A Periwound Skin Color: No Abnormality N/A N/A Temperature: Treatment Notes Electronic Signature([name]) Signed: 09/01/2023 10:50:51 AM By: Randall Guess MD FACS Entered By: Randall [name] on 09/01/2023 10:50:51 -------------------------------------------------------------------------------- Multi-Disciplinary Care Plan Details Patient Name: Date of Service: Randall [name], Randall [name]. 09/01/2023 10:15 A M Medical Record Number: [id number] Patient Account Number: [id number] Date of Birth/Sex: Treating RN: Jul 03, 1930 (89 y.o. Randall [name] Primary Care Randall [name]: Randall [name] Other Clinician: Referring Randall [name]: Treating Randall [name]/Extender: Randall [name] [name]: 7 Randall [name], Randall [name] ([id number]) [id number] Page 5 of 7 Multidisciplinary Care Plan reviewed with  physician Active Inactive Electronic Signature([name]) Signed: 09/01/2023 12:22:13 PM By: [name] Entered By: [name] on 09/01/2023 10:48:11 -------------------------------------------------------------------------------- Pain Assessment Details Patient Name: Date of Service: Randall [name], Randall [name]. 09/01/2023 10:15 A M Medical Record Number: [id number] Patient Account Number: [id number] Date of Birth/Sex: Treating RN: [birth year] (89 y.o. Randall [name] Primary Care Martasia Talamante: Randall [name] Other Clinician: Referring Kingston Guiles: Treating Sydni Elizarraraz/Extender: Randall [name] [name]: 7 Active Problems Location of Pain Severity and Description of Pain Patient Has Paino No Site Locations Rate the pain. Current Pain Level: 0 Pain Management and Medication Current Pain Management: Electronic Signature([name]) Signed: 09/01/2023 12:22:13 PM By: [name] Entered By: [name] on 09/01/2023 10:31:49 -------------------------------------------------------------------------------- Patient/Caregiver Education Details Patient Name: Date of Service: Randall [name], Randall [name]. 10/4/2024andnbsp10:15 A M Medical Record Number: [id number] Patient Account Number: [id number] Date of Birth/Gender: Treating RN: Sep 07, 1930 (89 y.o. Randall [name] Primary Care Physician: Randall [name] Other Clinician: Referring Physician: Treating Physician/Extender: Randall [name] [name]: 7 Matlin, Brittian [name] ([id number]) [id number] Page 6 of 7 Education Assessment Education Provided To: Patient and Caregiver Education Topics Provided Pressure: Methods: Explain/Verbal Responses: Reinforcements needed Wound/Skin Impairment: Methods: Explain/Verbal Responses: Reinforcements needed Electronic Signature([name]) Signed: 09/01/2023 12:22:13 PM By: [name] Entered By: [name] on 09/01/2023  10:45:45 -------------------------------------------------------------------------------- Wound Assessment Details Patient Name: Date of Service: Randall [name], Randall [name]. 09/01/2023 10:15 A M Medical Record Number: [id number] Patient Account Number: [id number] Date of Birth/Sex: Treating RN: 25-Sep-1930 (89 y.o. Randall [name] Primary Care Illeana Edick: Randall [name] Other Clinician: Referring Lulu Hirschmann: Treating Taurus Alamo/Extender: Randall [name] [name]: 7 Wound Status Wound Number: 1 Primary Pressure Ulcer Etiology: Wound Location: Sacrum Wound Healed - Epithelialized Wounding Event: Pressure Injury Status: Date Acquired: 06/07/2023 Comorbid Cataracts, Anemia, Congestive Heart Failure, Deep Vein Weeks Of Treatment: 7 History: Thrombosis, Osteoarthritis Clustered Wound: No Photos Wound Measurements Length: (cm) Width: (cm) Depth: (cm) Area: (cm) Volume: (cm) 0 % Reduction [name] Area: 100% 0 % Reduction [name] Volume: 100% 0 Epithelialization: Large (67-100%) 0 Tunneling: No 0 Undermining: No Wound Description Classification: Category/Stage III Exudate Amount: None Present Randall [name], Randall [name] ([id number]) Wound Bed Granulation Amount: None Present (0%) Necrotic Amount: None Present (0%) Foul Odor After Cleansing: No Slough/Fibrino No [id number] Page 7 of 7 Exposed Structure Fascia Exposed: No Fat Layer (Subcutaneous  N/A Volume (cm) : 100.00% N/A N/A % Reduction [name] A rea: 100.00% N/A N/A % Reduction [name] Volume: Category/Stage III N/A N/A Classification: None Present N/A N/A Exudate A mount: None Present (0%) N/A N/A Granulation A mount: None Present (0%) N/A N/A Necrotic A mount: Fascia: No N/A N/A Exposed Structures: Fat Layer (Subcutaneous Tissue): No Tendon: No Muscle: No Joint: No Bone: No Large (67-100%) N/A N/A Epithelialization: No Abnormalities Noted N/A N/A Periwound Skin Texture: No Abnormalities Noted N/A N/A Periwound Skin Moisture: No Abnormalities Noted N/A N/A Periwound Skin Color: No Abnormality N/A N/A Temperature: Treatment Notes Electronic Signature([name]) Signed: 09/01/2023 10:50:51 AM By: Randall Guess MD FACS Entered By: Randall [name] on 09/01/2023 10:50:51 -------------------------------------------------------------------------------- Multi-Disciplinary Care Plan Details Patient Name: Date of Service: Randall [name], Randall [name]. 09/01/2023 10:15 A M Medical Record Number: [id number] Patient Account Number: [id number] Date of Birth/Sex: Treating RN: Jul 03, 1930 (89 y.o. Randall [name] Primary Care Randall [name]: Randall [name] Other Clinician: Referring Randall [name]: Treating Randall [name]/Extender: Randall [name] [name]: 7 Randall [name], Randall [name] ([id number]) [id number] Page 5 of 7 Multidisciplinary Care Plan reviewed with  physician Active Inactive Electronic Signature([name]) Signed: 09/01/2023 12:22:13 PM By: [name] Entered By: [name] on 09/01/2023 10:48:11 -------------------------------------------------------------------------------- Pain Assessment Details Patient Name: Date of Service: Randall [name], Randall [name]. 09/01/2023 10:15 A M Medical Record Number: [id number] Patient Account Number: [id number] Date of Birth/Sex: Treating RN: [birth year] (89 y.o. Randall [name] Primary Care Martasia Talamante: Randall [name] Other Clinician: Referring Kingston Guiles: Treating Sydni Elizarraraz/Extender: Randall [name] [name]: 7 Active Problems Location of Pain Severity and Description of Pain Patient Has Paino No Site Locations Rate the pain. Current Pain Level: 0 Pain Management and Medication Current Pain Management: Electronic Signature([name]) Signed: 09/01/2023 12:22:13 PM By: [name] Entered By: [name] on 09/01/2023 10:31:49 -------------------------------------------------------------------------------- Patient/Caregiver Education Details Patient Name: Date of Service: Randall [name], Randall [name]. 10/4/2024andnbsp10:15 A M Medical Record Number: [id number] Patient Account Number: [id number] Date of Birth/Gender: Treating RN: Sep 07, 1930 (89 y.o. Randall [name] Primary Care Physician: Randall [name] Other Clinician: Referring Physician: Treating Physician/Extender: Randall [name] [name]: 7 Matlin, Brittian [name] ([id number]) [id number] Page 6 of 7 Education Assessment Education Provided To: Patient and Caregiver Education Topics Provided Pressure: Methods: Explain/Verbal Responses: Reinforcements needed Wound/Skin Impairment: Methods: Explain/Verbal Responses: Reinforcements needed Electronic Signature([name]) Signed: 09/01/2023 12:22:13 PM By: [name] Entered By: [name] on 09/01/2023  10:45:45 -------------------------------------------------------------------------------- Wound Assessment Details Patient Name: Date of Service: Randall [name], Randall [name]. 09/01/2023 10:15 A M Medical Record Number: [id number] Patient Account Number: [id number] Date of Birth/Sex: Treating RN: 25-Sep-1930 (89 y.o. Randall [name] Primary Care Illeana Edick: Randall [name] Other Clinician: Referring Lulu Hirschmann: Treating Taurus Alamo/Extender: Randall [name] [name]: 7 Wound Status Wound Number: 1 Primary Pressure Ulcer Etiology: Wound Location: Sacrum Wound Healed - Epithelialized Wounding Event: Pressure Injury Status: Date Acquired: 06/07/2023 Comorbid Cataracts, Anemia, Congestive Heart Failure, Deep Vein Weeks Of Treatment: 7 History: Thrombosis, Osteoarthritis Clustered Wound: No Photos Wound Measurements Length: (cm) Width: (cm) Depth: (cm) Area: (cm) Volume: (cm) 0 % Reduction [name] Area: 100% 0 % Reduction [name] Volume: 100% 0 Epithelialization: Large (67-100%) 0 Tunneling: No 0 Undermining: No Wound Description Classification: Category/Stage III Exudate Amount: None Present Randall [name], Randall [name] ([id number]) Wound Bed Granulation Amount: None Present (0%) Necrotic Amount: None Present (0%) Foul Odor After Cleansing: No Slough/Fibrino No [id number] Page 7 of 7 Exposed Structure Fascia Exposed: No Fat Layer (Subcutaneous  Randall [name], Randall [name] ([id number]) [id number] Page 1 of 7 Visit Report for 09/01/2023 Arrival Information Details Patient Name: Date of Service: Randall [name], Randall [name] [name]. 09/01/2023 10:15 A M Medical Record Number: [id number] Patient Account Number: [id number] Date of Birth/Sex: Treating RN: Jul 10, 1930 (89 y.o. Randall [name], Randall [name] Primary Care Randall [name]: Randall [name] Other Clinician: Referring Randall [name]: Treating Randall [name]/Extender: Randall [name] [name]: 7 Visit Information History Since Last Visit All ordered tests and consults were completed: No Patient Arrived: Wheel Chair Added or deleted any medications: No Arrival Time: 10:23 Any new allergies or adverse reactions: No Accompanied By: wife Had a fall or experienced change [name] No Transfer Assistance: None activities of daily living that may affect Patient Identification Verified: Yes risk of falls: Secondary Verification Process Completed: Yes Signs or symptoms of abuse/neglect since last visito No Patient Requires Transmission-Based Precautions: No Hospitalized since last visit: No Patient Has Alerts: No Implantable device outside of the clinic excluding No cellular tissue based products placed [name] the center since last visit: Has Dressing [name] Place as Prescribed: Yes Pain Present Now: No Electronic Signature([name]) Signed: 09/01/2023 12:22:13 PM By: [name] Entered By: [name] on 09/01/2023 10:29:02 -------------------------------------------------------------------------------- Clinic Level of Care Assessment Details Patient Name: Date of Service: Randall [name], Randall [name]. 09/01/2023 10:15 A M Medical Record Number: [id number] Patient Account Number: [id number] Date of Birth/Sex: Treating RN: [birth year] (89 y.o. Randall [name] Primary Care Randall [name]: Randall [name] Other Clinician: Referring Vanderbilt Ranieri: Treating Randall [name]/Extender: Randall [name] [name]: 7 Clinic  Level of Care Assessment Items TOOL 4 Quantity Score X- 1 0 Use when only an EandM is performed on FOLLOW-UP visit ASSESSMENTS - Nursing Assessment / Reassessment X- 1 10 Reassessment of Co-morbidities (includes updates [name] patient status) X- 1 5 Reassessment of Adherence to Treatment Plan ASSESSMENTS - Wound and Skin A ssessment / Reassessment X - Simple Wound Assessment / Reassessment - one wound 1 5 []  - 0 Complex Wound Assessment / Reassessment - multiple wounds []  - 0 Dermatologic / Skin Assessment (not related to wound area) ASSESSMENTS - Focused Assessment []  - 0 Circumferential Edema Measurements - multi extremities []  - 0 Nutritional Assessment / Counseling / Intervention Randall [name], Randall [name] ([id number]) [id number] Page 2 of 7 []  - 0 Lower Extremity Assessment (monofilament, tuning fork, pulses) []  - 0 Peripheral Arterial Disease Assessment (using hand held doppler) ASSESSMENTS - Ostomy and/or Continence Assessment and Care []  - 0 Incontinence Assessment and Management []  - 0 Ostomy Care Assessment and Management (repouching, etc.) PROCESS - Coordination of Care X - Simple Patient / Family Education for ongoing care 1 15 []  - 0 Complex (extensive) Patient / Family Education for ongoing care X- 1 10 Staff obtains Chiropractor, Records, T Results / Process Orders est []  - 0 Staff telephones HHA, Nursing Homes / Clarify orders / etc []  - 0 Routine Transfer to another Facility (non-emergent condition) []  - 0 Routine Hospital Admission (non-emergent condition) []  - 0 New Admissions / Manufacturing engineer / Ordering NPWT Apligraf, etc. , []  - 0 Emergency Hospital Admission (emergent condition) X- 1 10 Simple Discharge Coordination []  - 0 Complex (extensive) Discharge Coordination PROCESS - Special Needs []  - 0 Pediatric / Minor Patient Management []  - 0 Isolation Patient Management []  - 0 Hearing / Language / Visual special needs []  -  0 Assessment of Community assistance (transportation, D/C planning, etc.) []  - 0 Additional assistance / Altered mentation []  - 0 Support Surface([name]) Assessment (bed, cushion,

## 2023-09-11 ENCOUNTER — Encounter: Payer: Self-pay | Admitting: Physician Assistant

## 2023-09-11 DIAGNOSIS — M79605 Pain in left leg: Secondary | ICD-10-CM | POA: Diagnosis not present

## 2023-09-11 DIAGNOSIS — R5382 Chronic fatigue, unspecified: Secondary | ICD-10-CM | POA: Diagnosis not present

## 2023-09-11 DIAGNOSIS — M545 Low back pain, unspecified: Secondary | ICD-10-CM | POA: Diagnosis not present

## 2023-09-11 DIAGNOSIS — R269 Unspecified abnormalities of gait and mobility: Secondary | ICD-10-CM | POA: Diagnosis not present

## 2023-09-12 ENCOUNTER — Encounter: Payer: Self-pay | Admitting: Physician Assistant

## 2023-09-12 NOTE — Progress Notes (Signed)
[facility] [phone]   [phone]  PROGRESS NOTE  Patient Care Team: [name], MD as PCP - General (Internal Medicine) Little Ishikawa, MD as PCP - Cardiology (Cardiology) Holli Humbles, MD as Referring Physician (Ophthalmology) Pryor Ochoa, MD (Inactive) as Consulting Physician (Vascular Surgery) Hilarie Fredrickson, MD as Consulting Physician (Gastroenterology)  Hematological/Oncological History # Leukocytosis/Thrombocytosis # Normocytic Anemia  06/14/2023: WBC 14.7, Hgb 8.1, MCV 82.7, Plt 584 06/27/2023 : received 2 units of PRBC  07/03/2023 : WBC 20.5, ANC 17.5, Hgb 9.9, MCV 82.6, Plt 566K, sed rate of 114  07/05/2023: establish care with Dr. Leonides Schanz  07/21/2023: received 1 dose of IV monoferric 1000 mg  08/31/2023: WBC 17, Hgb 11.4, MCV 89.7, Plt 357  Interval History:  [name] 89 y.o. male with medical history significant for leukocytosis, thrombocytosis, normocytic anemia who presents for a follow up visit. The patient's last visit was on 07/05/2023. In the interim since the last visit he received 1 dose of IV Monoferric 1000 mg on 07/21/2023  On exam today [name] is accompanied by a staff member of his facility.  He reports that he feels like he has been breathing better and is down to 1 L of oxygen, 2 L at night.  He is not having any chest pain.  He denies any overt signs of bleeding, bruising, or dark stools.  He reports he has a good appetite and is eating well.  He is currently taking 15 mg of prednisone daily for his right shoulder pain.  He also notes that he is not having any lightheadedness, dizziness, shortness of breath.  He denies any infectious symptoms such as runny nose, sore throat, cough.  No reports of fevers, chills, sweats, nausea, vomiting or diarrhea.  A full 10 point ROS is otherwise negative.  MEDICAL HISTORY:  Past Medical History:  Diagnosis Date   AAA (abdominal aortic aneurysm) (HCC)    Abnormal  EKG    Left atrial abnormality   Allergic rhinitis    Allergy    Benign neoplasm of colon 04/14/2010   3 small polyps on colonoscopy by Dr. Marina Goodell   Carpal tunnel syndrome    Cataract    Decubitus ulcer of coccygeal region, stage 2 (HCC) 07/06/2023   Degenerative disc disease    Disturbances metabolism of methionine, homocystine, and cystathionine (HCC)    Elevated homocysteine   Elevated prostate specific antigen (PSA)    Elevated PSA 07/06/2023   Hearing loss    Hyperlipidemia    ILD (interstitial lung disease) (HCC) 07/06/2023   Impotence of organic origin    Penile implant   Internal hemorrhoids    Leukocytosis 07/06/2023   Neck pain    Otosclerosis of both ears    Peripheral vascular disease (HCC)    Bilateral femoral bruit   Plantar fasciitis    Polymyalgia rheumatica (HCC)    Polymyalgia rheumatica (HCC) 07/06/2023   Rotator cuff syndrome of left shoulder    Scoliosis    Shoulder pain     SURGICAL HISTORY: Past Surgical History:  Procedure Laterality Date   ABDOMINAL AORTIC ENDOVASCULAR STENT GRAFT N/A 08/16/2017   Procedure: ABDOMINAL AORTIC ENDOVASCULAR STENT GRAFT insertion;  Surgeon: Nada Libman, MD;  Location: [facility] OR;  Service: Vascular;  Laterality: N/A;   Actinic keratosis removal  06/21/2010   Left shoulder; Dr. 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  Procedure: DUPUYTREN CONTRACTURE RELEASE RIGHT LONG, RING AND SMALL FINGERS;  Surgeon: Wyn Forster., MD;  Location: [facility];  Service: Orthopedics;  Laterality: Right;   Implant penile pump  2001   IR ANGIOGRAM PELVIS SELECTIVE OR SUPRASELECTIVE  12/11/2020   IR ANGIOGRAM SELECTIVE EACH ADDITIONAL VESSEL  12/11/2020   IR EMBO ARTERIAL NOT HEMORR HEMANG INC GUIDE ROADMAPPING  12/11/2020   IR RADIOLOGIST EVAL & MGMT  07/30/2020   IR RADIOLOGIST EVAL & MGMT  08/16/2023   IR US GUIDE VASC ACCESS RIGHT  12/11/2020   Resection  of appendix and tip of rectum  February 2005   STAPEDECTOMY Bilateral 1985, 1988   [employer]    SOCIAL HISTORY: Social History   Socioeconomic History   Marital status: Married    Spouse name: Lamarion Spedding   Number of children: 2   Years of education: Not on file   Highest education level: Not on file  Occupational History   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   Social Determinants of Health   Financial Resource Strain: Low Risk  (03/07/2023)   Overall Financial Resource Strain (CARDIA)    Difficulty of Paying Living Expenses: Not hard at all  Food Insecurity: No Food Insecurity (03/15/2023)   Hunger Vital Sign    Worried About Running Out of Food in the Last Year: Never true    Ran Out of Food in the Last Year: Never true  Transportation Needs: No Transportation Needs (03/15/2023)   PRAPARE - Administrator, Civil Service (Medical): No    Lack of Transportation (Non-Medical): No  Physical Activity: Not on file  Stress: Not on file  Social Connections: Not on file  Intimate Partner Violence: Not At Risk (03/15/2023)   Humiliation, Afraid, Rape, and Kick questionnaire    Fear of Current or Ex-Partner: No    Emotionally Abused: No    Physically Abused: No    Sexually Abused: No    FAMILY HISTORY: Family History  Problem Relation Age of Onset   Heart disease Mother    Emphysema Mother    Leukemia Brother        Chronic lymphocytic leukemia   Colon cancer Neg Hx    Esophageal cancer Neg Hx     Rectal cancer Neg Hx    Stomach cancer Neg Hx     ALLERGIES:  is allergic to daptomycin, rosuvastatin, codeine, morphine and codeine, and omnicef [cefdinir].  MEDICATIONS:  Current Outpatient Medications  Medication Sig Dispense Refill   acetaminophen (TYLENOL) 325 MG tablet Take 2 tablets (650 mg total) by mouth every 6 (six) hours as needed for mild pain (or Fever >/= 101).     apixaban (ELIQUIS) 2.5 MG TABS tablet Take 2.5 mg by mouth 2 (two) times daily.     bisacodyl 5 MG EC tablet Take 10 mg by mouth daily as needed for moderate constipation.     butalbital-acetaminophen-caffeine (FIORICET) 50-325-40 MG tablet Take 1  tablet by mouth daily as needed for headache. 14 tablet 0   cholecalciferol (VITAMIN D3) 25 MCG (1000 UNIT) tablet Take 1,000 Units by mouth daily.     diclofenac Sodium (VOLTAREN) 1 % GEL Apply 2 g topically 4 (four) times daily as needed (shoulder pain). 2 g 0   docusate sodium (COLACE) 100 MG capsule Take 100 mg by mouth 2 (two) times daily.     feeding supplement (ENSURE ENLIVE / ENSURE PLUS) LIQD Take 237 mLs by mouth 3 (three) times daily between meals. 237 mL 12   finasteride (PROSCAR) 5 MG tablet Take 5 mg by mouth daily.     fluorometholone (FML) 0.1 % ophthalmic suspension Place 1 drop into the right eye at bedtime.      L-Methylfolate-B12-B6-B2 (METAFOLBIC) 04-28-49-5 MG TABS TAKE ONE TABLET TWICE DAILY (Patient taking differently: Take 1 tablet by mouth 2 (two) times daily.) 60 tablet 2   LORazepam (ATIVAN) 0.5 MG tablet Take 0.5 mg by mouth daily. 0.25 am, 0.25 pm,  0.5 at night     magnesium hydroxide (MILK OF MAGNESIA) 400 MG/5ML suspension Take by mouth daily as needed for mild constipation.     melatonin 3 MG TABS tablet Take 1 tablet (3 mg total) by mouth at bedtime. (Patient taking differently: Take 5 mg by mouth as needed.)  0   metoprolol succinate (TOPROL XL) 25 MG 24 hr tablet Take 1 tablet (25 mg total) by mouth daily. 90 tablet 3   mirtazapine  (REMERON) 15 MG tablet Take 15 mg by mouth at bedtime. Pt taking 30 mg     Multiple Vitamins-Minerals (MULTIVITAMIN PO) Take 1 tablet by mouth daily.      ondansetron (ZOFRAN) 4 MG tablet Take 1 tablet (4 mg total) by mouth every 4 (four) hours as needed for nausea or vomiting. 100 tablet 6   polyethylene glycol (MIRALAX / GLYCOLAX) 17 g packet Take 17 g by mouth 2 (two) times daily.     predniSONE (DELTASONE) 20 MG tablet Take 2 tablets (40 mg total) by mouth daily with breakfast. (Patient taking differently: Take 10 mg by mouth daily with breakfast. 15mg  daily)     promethazine (PHENERGAN) 12.5 MG tablet Take 1 tablet (12.5 mg total) by mouth daily as needed for nausea or vomiting. 30 tablet 6   psyllium (REGULOID) 0.52 g capsule Take 0.52 g by mouth daily.     tamsulosin (FLOMAX) 0.4 MG CAPS capsule Take 0.4 mg by mouth at bedtime.     Tart Cherry 1200 MG CAPS Take 1 tablet by mouth daily.     traMADol (ULTRAM) 50 MG tablet Take 50 mg by mouth every 4 (four) hours as needed for moderate pain or severe pain.     zolpidem (AMBIEN) 10 MG tablet Take 5 mg by mouth at bedtime as needed for sleep.     No current facility-administered medications for this visit.    REVIEW OF SYSTEMS:   Constitutional: ( - ) fevers, ( - )  chills , ( - ) night sweats Eyes: ( - ) blurriness of vision, ( - ) double vision, ( - ) watery eyes Ears, nose, mouth, throat, and face: ( - ) mucositis, ( - ) sore throat Respiratory: ( - ) cough, ( - ) dyspnea, ( - ) wheezes Cardiovascular: ( - ) palpitation, ( - ) chest discomfort, ( - ) lower extremity swelling Gastrointestinal:  ( - ) nausea, ( - ) heartburn, ( - ) change in bowel habits  Skin: ( - ) abnormal skin rashes Lymphatics: ( - ) new lymphadenopathy, ( - ) easy bruising Neurological: ( - ) numbness, ( - ) tingling, ( - ) new weaknesses Behavioral/Psych: ( - ) mood change, ( - ) new changes  All other systems were reviewed with the patient and are  negative.  PHYSICAL EXAMINATION:  Vitals:   08/31/23 1457  BP: 108/80  Pulse: (!) 129  Resp: 18  Temp: 97.6 F (36.4 C)  SpO2: 100%   Filed Weights   08/31/23 1457  Weight: 148 lb 6.4 oz (67.3 kg)    GENERAL: well appearing elderly Caucasian male, alert, no distress and comfortable SKIN: skin color, texture, turgor are normal, no rashes or significant lesions EYES: conjunctiva are pink and non-injected, sclera clear LUNGS: clear to auscultation and percussion with normal breathing effort HEART: regular rate & rhythm and no murmurs and no lower extremity edema Musculoskeletal: no cyanosis of digits and no clubbing  PSYCH: alert & oriented x 3, fluent speech NEURO: no focal motor/sensory deficits  LABORATORY DATA:  I have reviewed the data as listed    Latest Ref Rng & Units 08/31/2023    2:19 PM 08/08/2023   12:24 PM 07/05/2023   10:28 AM  CBC  WBC 4.0 - 10.5 K/uL 17.0  14.7  17.4   Hemoglobin 13.0 - 17.0 g/dL 56.4  33.2  9.4   Hematocrit 39.0 - 52.0 % 34.7  33.9  29.8   Platelets 150 - 400 K/uL 357  425.0  479        Latest Ref Rng & Units 08/31/2023    2:19 PM 08/08/2023   12:24 PM 07/05/2023   10:28 AM  CMP  Glucose 70 - 99 mg/dL 951  884  166   BUN 8 - 23 mg/dL 47  37  23   Creatinine 0.61 - 1.24 mg/dL 0.63  0.16  0.10   Sodium 135 - 145 mmol/L 139  141  138   Potassium 3.5 - 5.1 mmol/L 4.9  4.3  4.2   Chloride 98 - 111 mmol/L 103  104  101   CO2 22 - 32 mmol/L 30  30  30    Calcium 8.9 - 10.3 mg/dL 93.2  35.5  9.2   Total Protein 6.5 - 8.1 g/dL 7.1  7.3  6.8   Total Bilirubin 0.3 - 1.2 mg/dL 0.4  0.4  0.3   Alkaline Phos 38 - 126 U/L 88  78  80   AST 15 - 41 U/L 17  18  22    ALT 0 - 44 U/L 22  15  25      Lab Results  Component Value Date   MPROTEIN 0.3 (H) 07/05/2023   Lab Results  Component Value Date   KPAFRELGTCHN 84.4 (H) 07/05/2023   LAMBDASER 24.2 07/05/2023   KAPLAMBRATIO 3.49 (H) 07/05/2023   RADIOGRAPHIC STUDIES: ECHOCARDIOGRAM  COMPLETE  Result Date: 08/28/2023    ECHOCARDIOGRAM REPORT   Patient Name:   [name] Date of Exam: 08/28/2023 Medical Rec #:  [id number]      Height:       73.0 in Accession #:    [id number]     Weight:       148.0 lb Date of Birth:  Nov 02, 1930     BSA:          1.893 m Patient Age:    89 years       BP:  124/70 mmHg Patient Gender: M              HR:           91 bpm. Exam Location:  [address] Procedure: 2D Echo, 3D Echo, Cardiac Doppler and Color Doppler Indications:    R06.00 Dyspnea  History:        Patient has prior history of Echocardiogram examinations, most                 recent 03/06/2023. Abnormal ECG, PAD, Signs/Symptoms:Dyspnea; Risk                 Factors:Family History of Coronary Artery Disease. Aortic                 Sclerosis, Abdominal Aortic Aneurysm status post Stent Repair,                 Interstitial Lung Disease.  Sonographer:    Farrel Conners RDCS Referring Phys: Kalman Shan  Sonographer Comments: Image acquisition challenging due to uncooperative patient, Image acquisition challenging due to respiratory motion, Image acquisition challenging due to patient body habitus and Narrow Rib Spacing. IMPRESSIONS  1. Left ventricular ejection fraction, by estimation, is 50 to 55%. The left ventricle has low normal function. Left ventricular endocardial border not optimally defined to evaluate regional wall motion. Left ventricular diastolic parameters are consistent with Grade I diastolic dysfunction (impaired relaxation).  2. Right ventricular systolic function is normal. The right ventricular size is normal. Tricuspid regurgitation signal is inadequate for assessing PA pressure.  3. The mitral valve is normal in structure. No evidence of mitral valve regurgitation. No evidence of mitral stenosis.  4. The aortic valve is tricuspid. There is severe calcifcation of the aortic valve. Aortic valve regurgitation is not visualized. Mild to moderate aortic valve stenosis.  Aortic valve mean gradient measures 12.0 mmHg. Visually, aortic stenosis looked at least moderate but mean gradient only 12 mmHg.  5. Aortic dilatation noted. There is mild dilatation of the ascending aorta, measuring 43 mm.  6. The inferior vena cava is normal in size with greater than 50% respiratory variability, suggesting right atrial pressure of 3 mmHg.  7. Technically difficult study with poor acoustic windows. FINDINGS  Left Ventricle: Left ventricular ejection fraction, by estimation, is 50 to 55%. The left ventricle has low normal function. Left ventricular endocardial border not optimally defined to evaluate regional wall motion. The left ventricular internal cavity  size was normal in size. There is no left ventricular hypertrophy. Left ventricular diastolic parameters are consistent with Grade I diastolic dysfunction (impaired relaxation). Right Ventricle: The right ventricular size is normal. No increase in right ventricular wall thickness. Right ventricular systolic function is normal. Tricuspid regurgitation signal is inadequate for assessing PA pressure. Left Atrium: Left atrial size was normal in size. Right Atrium: Right atrial size was normal in size. Pericardium: There is no evidence of pericardial effusion. Mitral Valve: The mitral valve is normal in structure. No evidence of mitral valve regurgitation. No evidence of mitral valve stenosis. Tricuspid Valve: The tricuspid valve is normal in structure. Tricuspid valve regurgitation is not demonstrated. Aortic Valve: The aortic valve is tricuspid. There is severe calcifcation of the aortic valve. Aortic valve regurgitation is not visualized. Mild to moderate aortic stenosis is present. Aortic valve mean gradient measures 12.0 mmHg. Aortic valve peak gradient measures 17.3 mmHg. Aortic valve area, by VTI measures 2.75 cm. Pulmonic Valve: The pulmonic valve was normal in structure. Pulmonic valve regurgitation  is not visualized. Aorta: The aortic  root is normal in size and structure and aortic dilatation noted. There is mild dilatation of the ascending aorta, measuring 43 mm. Venous: The inferior vena cava is normal in size with greater than 50% respiratory variability, suggesting right atrial pressure of 3 mmHg. IAS/Shunts: No atrial level shunt detected by color flow Doppler.  LEFT VENTRICLE PLAX 2D LVIDd:         3.70 cm   Diastology LVIDs:         2.80 cm   LV e' medial:    5.44 cm/s LV PW:         0.60 cm   LV E/e' medial:  9.6 LV IVS:        0.70 cm   LV e' lateral:   12.40 cm/s LVOT diam:     2.70 cm   LV E/e' lateral: 4.2 LV SV:         88 LV SV Index:   47 LVOT Area:     5.73 cm                           3D Volume EF:                          3D EF:        50 %                          LV EDV:       101 ml                          LV ESV:       51 ml                          LV SV:        50 ml RIGHT VENTRICLE RV Basal diam:  3.20 cm RV S prime:     12.00 cm/s TAPSE (M-mode): 1.2 cm LEFT ATRIUM             Index        RIGHT ATRIUM           Index LA diam:        3.50 cm 1.85 cm/m   RA Pressure: 3.00 mmHg LA Vol (A2C):   33.7 ml 17.80 ml/m  RA Area:     15.90 cm LA Vol (A4C):   53.6 ml 28.31 ml/m  RA Volume:   39.70 ml  20.97 ml/m LA Biplane Vol: 43.5 ml 22.98 ml/m  AORTIC VALVE AV Area (Vmax):    2.62 cm AV Area (Vmean):   2.57 cm AV Area (VTI):     2.75 cm AV Vmax:           207.80 cm/s AV Vmean:          139.800 cm/s AV VTI:            0.321 m AV Peak Grad:      17.3 mmHg AV Mean Grad:      12.0 mmHg LVOT Vmax:         95.17 cm/s LVOT Vmean:        62.733 cm/s LVOT VTI:          0.154 m LVOT/AV VTI ratio: 0.48  AORTA  Ao Root diam: 3.70 cm Ao Asc diam:  4.25 cm MITRAL VALVE               TRICUSPID VALVE MV Area (PHT): cm         Estimated RAP:  3.00 mmHg MV Decel Time: 167 msec MV E velocity: 52.15 cm/s  SHUNTS MV A velocity: 82.55 cm/s  Systemic VTI:  0.15 m MV E/A ratio:  0.63        Systemic Diam: 2.70 cm Dalton McleanMD  Electronically signed by Wilfred Lacy Signature Date/Time: 08/28/2023/1:13:31 PM    Final    IR Radiologist Eval & Mgmt  Result Date: 08/16/2023 EXAM: NEW PATIENT OFFICE VISIT CHIEF COMPLAINT: See Epic note. HISTORY OF PRESENT ILLNESS: See Epic note. REVIEW OF SYSTEMS: See Epic note. PHYSICAL EXAMINATION: See Epic note. ASSESSMENT AND PLAN: See Epic note. Marliss Coots, MD [facility] Radiology Electronically Signed   By: Marliss Coots M.D.   On: 08/16/2023 11:02    ASSESSMENT & PLAN [name] 89 y.o. male with medical history significant for leukocytosis, thrombocytosis, normocytic anemia who presents for a follow up visit.  #Normocytic Anemia-Improving # Thrombocytosis-resolved # Leukocytosis- improving -- Hemoglobin improved markedly with IV iron infusion.  Hemoglobin today 11.4. -- Leukocytosis of neutrophilic predominant remains persistent, likely secondary to underlying inflammatory disorder and steroid use -- Thrombocytosis has resolved with IV iron infusion. -- At this time findings appear most consistent with iron deficiency anemia secondary to underlying inflammatory disorder. --MPN panel shows no evidence of a mutation causing his hematological abnormalities. -- Iron labs today show ferritin of 217, saturation 16%, total iron binding capacity 239 -- Recommend to return to clinic in 6 weeks for labs in 3 months for labs and clinic visit.  No orders of the defined types were placed in this encounter.   All questions were answered. The patient knows to call the clinic with any problems, questions or concerns.  A total of more than 30 minutes were spent on this encounter with face-to-face time and non-face-to-face time, including preparing to see the patient, ordering tests and/or medications, counseling the patient and coordination of care as outlined above.   Ulysees Barns, MD Department of Hematology/Oncology [facility]  [facility] Phone: [phone] Pager: [phone] Email: [email or web address]  09/12/2023 12:00 PM

## 2023-09-14 DIAGNOSIS — M545 Low back pain, unspecified: Secondary | ICD-10-CM | POA: Diagnosis not present

## 2023-09-14 DIAGNOSIS — R269 Unspecified abnormalities of gait and mobility: Secondary | ICD-10-CM | POA: Diagnosis not present

## 2023-09-14 DIAGNOSIS — R5382 Chronic fatigue, unspecified: Secondary | ICD-10-CM | POA: Diagnosis not present

## 2023-09-14 DIAGNOSIS — M79605 Pain in left leg: Secondary | ICD-10-CM | POA: Diagnosis not present

## 2023-09-18 DIAGNOSIS — M79605 Pain in left leg: Secondary | ICD-10-CM | POA: Diagnosis not present

## 2023-09-18 DIAGNOSIS — R5382 Chronic fatigue, unspecified: Secondary | ICD-10-CM | POA: Diagnosis not present

## 2023-09-18 DIAGNOSIS — M545 Low back pain, unspecified: Secondary | ICD-10-CM | POA: Diagnosis not present

## 2023-09-18 DIAGNOSIS — R269 Unspecified abnormalities of gait and mobility: Secondary | ICD-10-CM | POA: Diagnosis not present

## 2023-09-19 DIAGNOSIS — E2740 Unspecified adrenocortical insufficiency: Secondary | ICD-10-CM | POA: Diagnosis not present

## 2023-09-19 DIAGNOSIS — M353 Polymyalgia rheumatica: Secondary | ICD-10-CM | POA: Diagnosis not present

## 2023-09-20 DIAGNOSIS — M353 Polymyalgia rheumatica: Secondary | ICD-10-CM | POA: Diagnosis not present

## 2023-09-21 ENCOUNTER — Telehealth: Payer: Self-pay | Admitting: Pharmacy Technician

## 2023-09-21 ENCOUNTER — Other Ambulatory Visit (HOSPITAL_COMMUNITY): Payer: Self-pay

## 2023-09-21 DIAGNOSIS — R269 Unspecified abnormalities of gait and mobility: Secondary | ICD-10-CM | POA: Diagnosis not present

## 2023-09-21 DIAGNOSIS — M79605 Pain in left leg: Secondary | ICD-10-CM | POA: Diagnosis not present

## 2023-09-21 DIAGNOSIS — M545 Low back pain, unspecified: Secondary | ICD-10-CM | POA: Diagnosis not present

## 2023-09-21 DIAGNOSIS — R5382 Chronic fatigue, unspecified: Secondary | ICD-10-CM | POA: Diagnosis not present

## 2023-09-21 NOTE — Telephone Encounter (Signed)
Pharmacy Patient Advocate Encounter   Received notification from CoverMyMeds that prior authorization for PROMETHAZINE 12.5MG  is required/requested.   Insurance verification completed.   The patient is insured through [facility] .   Per test claim: PA required; PA submitted to [employer] via CoverMyMeds Key/confirmation #/EOC [id number] Status is pending

## 2023-09-22 ENCOUNTER — Encounter: Payer: Self-pay | Admitting: Physician Assistant

## 2023-09-22 NOTE — Progress Notes (Signed)
This encounter was created in error - please disregard.

## 2023-09-25 DIAGNOSIS — M545 Low back pain, unspecified: Secondary | ICD-10-CM | POA: Diagnosis not present

## 2023-09-25 DIAGNOSIS — R5382 Chronic fatigue, unspecified: Secondary | ICD-10-CM | POA: Diagnosis not present

## 2023-09-25 DIAGNOSIS — M79605 Pain in left leg: Secondary | ICD-10-CM | POA: Diagnosis not present

## 2023-09-25 DIAGNOSIS — R269 Unspecified abnormalities of gait and mobility: Secondary | ICD-10-CM | POA: Diagnosis not present

## 2023-09-27 DIAGNOSIS — R269 Unspecified abnormalities of gait and mobility: Secondary | ICD-10-CM | POA: Diagnosis not present

## 2023-09-27 DIAGNOSIS — R5382 Chronic fatigue, unspecified: Secondary | ICD-10-CM | POA: Diagnosis not present

## 2023-09-27 DIAGNOSIS — M545 Low back pain, unspecified: Secondary | ICD-10-CM | POA: Diagnosis not present

## 2023-09-27 DIAGNOSIS — M79605 Pain in left leg: Secondary | ICD-10-CM | POA: Diagnosis not present

## 2023-09-28 ENCOUNTER — Ambulatory Visit (INDEPENDENT_AMBULATORY_CARE_PROVIDER_SITE_OTHER): Payer: Medicare Other | Admitting: Adult Health

## 2023-09-28 ENCOUNTER — Encounter: Payer: Self-pay | Admitting: Adult Health

## 2023-09-28 VITALS — BP 90/62 | HR 99 | Ht 72.0 in | Wt 154.0 lb

## 2023-09-28 DIAGNOSIS — F331 Major depressive disorder, recurrent, moderate: Secondary | ICD-10-CM | POA: Diagnosis not present

## 2023-09-28 MED ORDER — SERTRALINE HCL 25 MG PO TABS: ORAL_TABLET | ORAL | 2 refills | Status: DC

## 2023-09-28 MED ORDER — SERTRALINE HCL 25 MG PO TABS
ORAL_TABLET | ORAL | 2 refills | Status: DC
Start: 2023-09-28 — End: 2023-09-28

## 2023-09-28 NOTE — Progress Notes (Signed)
[facility] MD/PA/NP Initial Note  09/29/2023 3:20 PM [name]  MRN:  [id number]  Chief Complaint:   HPI:   Patient seen today for initial psychiatric evaluation.   Referred by [facility].  Accompanied by wife.   Wife reporting husband requiring hospitalization and a long recovery over the past several months. He was most recently discharged from [facility] rehabilitation center. He is home and has several outside resources involved in his care and recovery. He also has a full time nurse.   While at [facility], a therapist reviewed his psychiatric medications and made a recommendation to have psychiatry review current medications. After reviewing medications with wife and husband, it was decided to leave medications at current doses doe now. [name] continues to make progress both physically and mentally. He feels like he is doing better and would like to continue with current regimen.   Describes mood today as "better". Denies tearfulness. Mood symptoms - reports some situational depression.  Denies anxiety and irritability.  Denies panic attacks. Reports some worry, rumination and over thinking - gets fixated on things. Reports mood has improved. Stating "I feel like I'm doing better". Improved  interest and motivation. Taking medications as prescribed.  Energy levels improving. Active, does not have a regular exercise routine.   Enjoys some usual interests and activities. Married 50 years - 2 grown sons. Spending time with family. Appetite adequate.  Weight loss 155 from 175 pounds. Had gotten down 141 weight. Sleeps well most nights. Averages 9 to 10 hours. Focus and concentration stable. Completing tasks. Managing aspects of household. Retired.  Denies SI or HI.  Denies AH or VH. Denies self harm. Denies substance use.  Previous medication trials: Denies  Visit Diagnosis:    ICD-10-CM   1. Major depressive disorder, recurrent episode, moderate (HCC)  F33.1 sertraline  (ZOLOFT) 25 MG tablet    DISCONTINUED: sertraline (ZOLOFT) 25 MG tablet      Past Psychiatric History: Denies psychiatric hospitalization.   Past Medical History:  Past Medical History:  Diagnosis Date   AAA (abdominal aortic aneurysm) (HCC)    Abnormal EKG    Left atrial abnormality   Allergic rhinitis    Allergy    Benign neoplasm of colon 04/14/2010   3 small polyps on colonoscopy by Dr. Marina Goodell   Carpal tunnel syndrome    Cataract    Decubitus ulcer of coccygeal region, stage 2 (HCC) 07/06/2023   Degenerative disc disease    Disturbances metabolism of methionine, homocystine, and cystathionine (HCC)    Elevated homocysteine   Elevated prostate specific antigen (PSA)    Elevated PSA 07/06/2023   Hearing loss    Hyperlipidemia    ILD (interstitial lung disease) (HCC) 07/06/2023   Impotence of organic origin    Penile implant   Internal hemorrhoids    Leukocytosis 07/06/2023   Neck pain    Otosclerosis of both ears    Peripheral vascular disease (HCC)    Bilateral femoral bruit   Plantar fasciitis    Polymyalgia rheumatica (HCC)    Polymyalgia rheumatica (HCC) 07/06/2023   Rotator cuff syndrome of left shoulder    Scoliosis    Shoulder pain     Past Surgical History:  Procedure Laterality Date   ABDOMINAL AORTIC ENDOVASCULAR STENT GRAFT N/A 08/16/2017   Procedure: ABDOMINAL AORTIC ENDOVASCULAR STENT GRAFT insertion;  Surgeon: Nada Libman, MD;  Location: [facility] OR;  Service: Vascular;  Laterality: N/A;   Actinic keratosis removal  06/21/2010   Left shoulder;  Dr. 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   Procedure: DUPUYTREN CONTRACTURE RELEASE RIGHT LONG, RING AND SMALL FINGERS;  Surgeon: Wyn Forster., MD;  Location: [facility];  Service: Orthopedics;  Laterality: Right;   Implant penile pump  2001   IR ANGIOGRAM PELVIS SELECTIVE OR SUPRASELECTIVE  12/11/2020   IR ANGIOGRAM  SELECTIVE EACH ADDITIONAL VESSEL  12/11/2020   IR EMBO ARTERIAL NOT HEMORR HEMANG INC GUIDE ROADMAPPING  12/11/2020   IR RADIOLOGIST EVAL & MGMT  07/30/2020   IR RADIOLOGIST EVAL & MGMT  08/16/2023   IR US GUIDE VASC ACCESS RIGHT  12/11/2020   Resection of appendix and tip of rectum  February 2005   STAPEDECTOMY Bilateral 1985, 1988   [employer]    Family Psychiatric History: Denies any family history of mental illness.   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   Social Determinants of Health   Financial Resource Strain: Low Risk  (03/07/2023)   Overall Financial Resource Strain (CARDIA)    Difficulty of Paying Living Expenses: Not hard at all  Food Insecurity: No Food Insecurity (03/15/2023)   Hunger Vital Sign    Worried About Running Out of Food in the Last Year: Never true    Ran Out of Food in the Last Year: Never true  Transportation Needs: No Transportation Needs (03/15/2023)   PRAPARE - Scientist, research (physical sciences) (Medical): No    Lack of Transportation (Non-Medical): No  Physical Activity: Not on file  Stress: Not on file  Social Connections: Not on file    Allergies:  Allergies  Allergen Reactions   Daptomycin Other (See Comments)    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   Rosuvastatin Other (See Comments)    Stopped taking due to feeling achy     Codeine Nausea And Vomiting   Morphine And Codeine Nausea And Vomiting   Omnicef [Cefdinir] Other (See Comments)    Abdominal pain    Metabolic Disorder Labs: Lab Results  Component Value Date   HGBA1C 6.3 (H) 03/07/2023   MPG 134.11 03/07/2023   No results found for: "PROLACTIN" Lab Results  Component Value Date   CHOL 148 09/26/2016   TRIG 71 09/26/2016  HDL 52 09/26/2016   CHOLHDL 2.8 09/26/2016   VLDL 14 09/26/2016   LDLCALC 82 09/26/2016   LDLCALC 68 03/21/2016   Lab Results  Component Value Date   TSH 1.869 03/05/2023   TSH 1.281 01/24/2023    Therapeutic Level Labs: No results found for: "LITHIUM" No results found for: "VALPROATE" No results found for: "CBMZ"  Current Medications: Current Outpatient Medications  Medication Sig Dispense Refill   acetaminophen (TYLENOL) 325 MG tablet Take 2 tablets (650 mg total) by mouth every 6 (six) hours as needed for mild pain (or Fever >/= 101).     apixaban (ELIQUIS) 2.5 MG TABS tablet Take 2.5 mg by mouth 2 (two) times daily.     bisacodyl 5 MG EC tablet Take 10 mg by mouth daily as needed for moderate constipation.     butalbital-acetaminophen-caffeine (FIORICET) 50-325-40 MG tablet Take 1 tablet by mouth daily as needed for headache. 14 tablet 0   cholecalciferol (VITAMIN D3) 25 MCG (1000 UNIT) tablet Take 1,000 Units by mouth daily.     diclofenac Sodium (VOLTAREN) 1 % GEL Apply 2 g topically 4 (four) times daily as needed (shoulder pain). 2 g 0   docusate sodium (COLACE) 100 MG capsule Take 100 mg by mouth 2 (two) times daily.     feeding supplement (ENSURE ENLIVE  / ENSURE PLUS) LIQD Take 237 mLs by mouth 3 (three) times daily between meals. 237 mL 12   finasteride (PROSCAR) 5 MG tablet Take 5 mg by mouth daily.     fluorometholone (FML) 0.1 % ophthalmic suspension Place 1 drop into the right eye at bedtime.      L-Methylfolate-B12-B6-B2 (METAFOLBIC) 04-28-49-5 MG TABS TAKE ONE TABLET TWICE DAILY (Patient taking differently: Take 1 tablet by mouth 2 (two) times daily.) 60 tablet 2   LORazepam (ATIVAN) 0.5 MG tablet Take 0.5 mg by mouth daily. 0.25 am, 0.25 pm,  0.5 at night     magnesium hydroxide (MILK OF MAGNESIA) 400 MG/5ML suspension Take by mouth daily as needed for mild constipation.     melatonin 3 MG TABS tablet Take 1 tablet (3 mg total) by mouth at bedtime. (Patient taking differently: Take 5 mg by mouth as needed.)  0   metoprolol succinate (TOPROL XL) 25 MG 24 hr tablet Take 1 tablet (25 mg total) by mouth daily. 90 tablet 3   mirtazapine (REMERON) 15 MG tablet Take 15 mg by mouth at bedtime. Pt taking 30 mg     Multiple Vitamins-Minerals (MULTIVITAMIN PO) Take 1 tablet by mouth daily.      ondansetron (ZOFRAN) 4 MG tablet Take 1 tablet (4 mg total) by mouth every 4 (four) hours as needed for nausea or vomiting. 100 tablet 6   polyethylene glycol (MIRALAX / GLYCOLAX) 17 g packet Take 17 g by mouth 2 (two) times daily.     predniSONE (DELTASONE) 20 MG tablet Take 2 tablets (40 mg total) by mouth daily with breakfast. (Patient taking differently: Take 10 mg by mouth daily with breakfast. 15mg  daily)     promethazine (PHENERGAN) 12.5 MG tablet Take 1 tablet (12.5 mg total) by mouth daily as needed for nausea or vomiting. 30 tablet 6   psyllium (REGULOID) 0.52 g capsule Take 0.52 g by mouth daily.     sertraline (ZOLOFT) 25 MG tablet Take 1/2 tablet daily x 7 days, then increase to one tablet. 30 tablet 2   tamsulosin (FLOMAX) 0.4 MG CAPS capsule Take 0.4 mg by mouth at bedtime.  Tart Cherry 1200 MG CAPS Take 1 tablet by mouth daily.     traMADol  (ULTRAM) 50 MG tablet Take 50 mg by mouth every 4 (four) hours as needed for moderate pain or severe pain.     zolpidem (AMBIEN) 10 MG tablet Take 5 mg by mouth at bedtime as needed for sleep.     No current facility-administered medications for this visit.    Medication Side Effects: none  Orders placed this visit:  No orders of the defined types were placed in this encounter.   Psychiatric Specialty Exam:  Review of Systems  Musculoskeletal:  Negative for gait problem.  Neurological:  Negative for tremors.  Psychiatric/Behavioral:         Please refer to HPI    There were no vitals taken for this visit.There is no height or weight on file to calculate BMI.  General Appearance: Casual and Neat  Eye Contact:  Good  Speech:  Clear and Coherent and Normal Rate  Volume:  Normal  Mood:  Euthymic  Affect:  Appropriate and Congruent  Thought Process:  Coherent and Descriptions of Associations: Intact  Orientation:  Other:  some impariment  Thought Content:  Appropriate to situation.    Suicidal Thoughts:  No  Homicidal Thoughts:  No  Memory:   Impaired with recent illness.  Judgement:  Fair  Insight:  Fair  Psychomotor Activity:  Decreased and improving  Concentration:  Concentration: Fair  Recall:  [employer] of Knowledge: Fair  Language: Good  Assets:  Communication Skills Desire for Improvement Financial Resources/Insurance Housing Intimacy Leisure Time Physical Health Resilience Social Support Talents/Skills Transportation Vocational/Educational  ADL's:  Intact  Cognition: Impaired,  Mild  Prognosis:  Fair   Screenings:  Mini-Mental    Flowsheet Row Office Visit from 09/28/2016 in [facility] Office Visit from 09/23/2015 in [facility]  Total Score (max 30 points ) 29 27      PHQ2-9    Flowsheet Row Office Visit from 09/28/2016 in [facility]  Office Visit from 09/23/2015 in [facility] Office Visit from 08/26/2014 in [facility] Office Visit from 08/20/2013 in [facility]  PHQ-2 Total Score 0 0 0 0      Flowsheet Row ED to Hosp-Admission (Discharged) from 03/05/2023 in [facility] 3E HF PCU ED to Hosp-Admission (Discharged) from 01/24/2023 in [facility]E Progressive Care Medication Injection 15 from 01/02/2023 in [facility]  C-SSRS RISK CATEGORY No Risk No Risk No Risk       Receiving Psychotherapy: No   Treatment Plan/Recommendations:   Plan:  PDMP reviewed  Patient to continue:  Lorazepam 0.5mg  - 0.25 am, afternoon, and whole tab at night Ambien 5mg  at hs Remeron 15mg  - 2 at bedtime   RTC as needed  Patient advised to contact office with any questions, adverse effects, or acute worsening in signs and symptoms.  Discussed potential benefits, risk, and side effects of benzodiazepines to include potential risk of tolerance and dependence, as well as possible drowsiness.  Advised patient not to drive if experiencing drowsiness and to take lowest possible effective dose to minimize risk of dependence and tolerance.    Time spent with patient was 60 minutes. Greater than 50% of face to face time with patient  was spent on counseling and coordination of care. Discussed current medication regimen with wife and patient. Wife feels he has improved physically and mentally.    Dorothyann Gibbs, NP

## 2023-09-29 ENCOUNTER — Encounter: Payer: Self-pay | Admitting: Adult Health

## 2023-10-02 DIAGNOSIS — R269 Unspecified abnormalities of gait and mobility: Secondary | ICD-10-CM | POA: Diagnosis not present

## 2023-10-02 DIAGNOSIS — R5382 Chronic fatigue, unspecified: Secondary | ICD-10-CM | POA: Diagnosis not present

## 2023-10-02 DIAGNOSIS — M545 Low back pain, unspecified: Secondary | ICD-10-CM | POA: Diagnosis not present

## 2023-10-02 DIAGNOSIS — M79605 Pain in left leg: Secondary | ICD-10-CM | POA: Diagnosis not present

## 2023-10-04 DIAGNOSIS — R269 Unspecified abnormalities of gait and mobility: Secondary | ICD-10-CM | POA: Diagnosis not present

## 2023-10-04 DIAGNOSIS — M353 Polymyalgia rheumatica: Secondary | ICD-10-CM | POA: Diagnosis not present

## 2023-10-04 DIAGNOSIS — M545 Low back pain, unspecified: Secondary | ICD-10-CM | POA: Diagnosis not present

## 2023-10-04 DIAGNOSIS — R5382 Chronic fatigue, unspecified: Secondary | ICD-10-CM | POA: Diagnosis not present

## 2023-10-04 DIAGNOSIS — M79605 Pain in left leg: Secondary | ICD-10-CM | POA: Diagnosis not present

## 2023-10-05 ENCOUNTER — Other Ambulatory Visit (HOSPITAL_COMMUNITY): Payer: Self-pay

## 2023-10-05 NOTE — Telephone Encounter (Signed)
Pharmacy Patient Advocate Encounter  Received notification from [facility] that Prior Authorization for Promethazine HCl 12.5MG  tablets has been APPROVED from 09/26/2023 to 09/25/2024. Ran test claim, Copay is $0.73. This test claim was processed through [facility]- copay amounts may vary at other pharmacies due to pharmacy/plan contracts, or as the patient moves through the different stages of their insurance plan.   PA #/Case ID/Reference #: [id number]

## 2023-10-10 DIAGNOSIS — M545 Low back pain, unspecified: Secondary | ICD-10-CM | POA: Diagnosis not present

## 2023-10-10 DIAGNOSIS — M79605 Pain in left leg: Secondary | ICD-10-CM | POA: Diagnosis not present

## 2023-10-10 DIAGNOSIS — R5382 Chronic fatigue, unspecified: Secondary | ICD-10-CM | POA: Diagnosis not present

## 2023-10-10 DIAGNOSIS — R269 Unspecified abnormalities of gait and mobility: Secondary | ICD-10-CM | POA: Diagnosis not present

## 2023-10-11 ENCOUNTER — Other Ambulatory Visit: Payer: Self-pay | Admitting: Hematology and Oncology

## 2023-10-11 ENCOUNTER — Other Ambulatory Visit: Payer: Self-pay

## 2023-10-11 ENCOUNTER — Inpatient Hospital Stay: Payer: Medicare Other | Attending: Physician Assistant

## 2023-10-11 DIAGNOSIS — D649 Anemia, unspecified: Secondary | ICD-10-CM | POA: Diagnosis not present

## 2023-10-11 DIAGNOSIS — D509 Iron deficiency anemia, unspecified: Secondary | ICD-10-CM

## 2023-10-11 LAB — CMP (CANCER CENTER ONLY)
ALT: 16 U/L (ref 0–44)
AST: 18 U/L (ref 15–41)
Albumin: 3.6 g/dL (ref 3.5–5.0)
Alkaline Phosphatase: 80 U/L (ref 38–126)
Anion gap: 5 (ref 5–15)
BUN: 38 mg/dL — ABNORMAL HIGH (ref 8–23)
CO2: 31 mmol/L (ref 22–32)
Calcium: 10 mg/dL (ref 8.9–10.3)
Chloride: 103 mmol/L (ref 98–111)
Creatinine: 0.97 mg/dL (ref 0.61–1.24)
GFR, Estimated: >60 mL/min (ref >60)
Glucose, Bld: 111 mg/dL — ABNORMAL HIGH (ref 70–99)
Potassium: 4.4 mmol/L (ref 3.5–5.1)
Sodium: 139 mmol/L (ref 135–145)
Total Bilirubin: 0.3 mg/dL (ref <1.2)
Total Protein: 6.9 g/dL (ref 6.5–8.1)

## 2023-10-11 LAB — IRON AND IRON BINDING CAPACITY (CC-WL,HP ONLY)
Iron: 58 ug/dL (ref 45–182)
Saturation Ratios: 24 % (ref 17.9–39.5)
TIBC: 242 ug/dL — ABNORMAL LOW (ref 250–450)
UIBC: 184 ug/dL (ref 117–376)

## 2023-10-11 LAB — CBC WITH DIFFERENTIAL (CANCER CENTER ONLY)
Abs Immature Granulocytes: 0.08 10*3/uL — ABNORMAL HIGH (ref 0.00–0.07)
Basophils Absolute: 0 10*3/uL (ref 0.0–0.1)
Basophils Relative: 0 %
Eosinophils Absolute: 0.1 10*3/uL (ref 0.0–0.5)
Eosinophils Relative: 1 %
HCT: 38.8 % — ABNORMAL LOW (ref 39.0–52.0)
Hemoglobin: 12.6 g/dL — ABNORMAL LOW (ref 13.0–17.0)
Immature Granulocytes: 1 %
Lymphocytes Relative: 8 %
Lymphs Abs: 1.2 10*3/uL (ref 0.7–4.0)
MCH: 30.7 pg (ref 26.0–34.0)
MCHC: 32.5 g/dL (ref 30.0–36.0)
MCV: 94.4 fL (ref 80.0–100.0)
Monocytes Absolute: 1 10*3/uL (ref 0.1–1.0)
Monocytes Relative: 7 %
Neutro Abs: 12.1 10*3/uL — ABNORMAL HIGH (ref 1.7–7.7)
Neutrophils Relative %: 83 %
Platelet Count: 307 10*3/uL (ref 150–400)
RBC: 4.11 MIL/uL — ABNORMAL LOW (ref 4.22–5.81)
RDW: 19.4 % — ABNORMAL HIGH (ref 11.5–15.5)
WBC Count: 14.5 10*3/uL — ABNORMAL HIGH (ref 4.0–10.5)
nRBC: 0 % (ref 0.0–0.2)

## 2023-10-11 LAB — RETIC PANEL
Immature Retic Fract: 11.1 % (ref 2.3–15.9)
RBC.: 4 MIL/uL — ABNORMAL LOW (ref 4.22–5.81)
Retic Count, Absolute: 48.4 10*3/uL (ref 19.0–186.0)
Retic Ct Pct: 1.2 % (ref 0.4–3.1)
Reticulocyte Hemoglobin: 35.5 pg (ref >27.9)

## 2023-10-11 LAB — FERRITIN: Ferritin: 86 ng/mL (ref 24–336)

## 2023-10-12 DIAGNOSIS — M545 Low back pain, unspecified: Secondary | ICD-10-CM | POA: Diagnosis not present

## 2023-10-12 DIAGNOSIS — R269 Unspecified abnormalities of gait and mobility: Secondary | ICD-10-CM | POA: Diagnosis not present

## 2023-10-12 DIAGNOSIS — M79605 Pain in left leg: Secondary | ICD-10-CM | POA: Diagnosis not present

## 2023-10-12 DIAGNOSIS — R5382 Chronic fatigue, unspecified: Secondary | ICD-10-CM | POA: Diagnosis not present

## 2023-10-16 ENCOUNTER — Other Ambulatory Visit: Payer: Self-pay

## 2023-10-16 DIAGNOSIS — R5382 Chronic fatigue, unspecified: Secondary | ICD-10-CM | POA: Diagnosis not present

## 2023-10-16 DIAGNOSIS — M79605 Pain in left leg: Secondary | ICD-10-CM | POA: Diagnosis not present

## 2023-10-16 DIAGNOSIS — R269 Unspecified abnormalities of gait and mobility: Secondary | ICD-10-CM | POA: Diagnosis not present

## 2023-10-16 DIAGNOSIS — I7143 Infrarenal abdominal aortic aneurysm, without rupture: Secondary | ICD-10-CM

## 2023-10-16 DIAGNOSIS — M545 Low back pain, unspecified: Secondary | ICD-10-CM | POA: Diagnosis not present

## 2023-10-16 NOTE — Progress Notes (Unsigned)
Admit date:     01/24/2023  Discharge date: 01/25/2023  Principal Problem:   Fever Patient is a 89 year old male with past medical history of polymyalgia rheumatica on 5 mg of prednisone daily, hyperlipidemia and AAA who while on vacation last June (7 months ago) contracted a viral illness causing some upper respiratory symptoms.  At that time, patient not tested for any viral illnesses including COVID.  Soon after, this viral illness caused his PMR to flareup and patient's prednisone dosage increased from 5 mg to 25 mg (starting August 2023).  Patient's joint pain resolved sometime soon after.  However, since this viral illness, patient has had significant fatigue impacting his life. 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  Patient was seen by his rheumatologist on 2/21 and in attempt to wean down his prednisone, patient started on weekly methotrexate.  (Prednisone not yet weaned until methotrexate felt to be efficacious.)   Patient's wife noted that he has been having fevers that had started Saturday, 2/24.  Patient started first dose of weekly methotrexate Sunday, 2/25.  (Wife and patient are very clear as to when fevers are started and when methotrexate dose was first given, the day after fever started.)  Patient saw his PCP on 2/27 and given new fevers, there were concerns that patient in his steroid-induced immunosuppressed state may have endocarditis and was referred to the emergency room for further evaluation.  In the emergency room, patient noted to have normal lactic acid and a white blood cell count of 14.5 as well as a sed rate of 78.  Brought in for further evaluation.  Fever During patient's entire hospitalization, was not febrile.  Unclear etiology if he has any infection.  Chest x-ray and urinalysis unremarkable.  White blood cell count elevated, but mildly and in the  context of patient being on high-dose of steroids continuously.  Blood cultures drawn and patient was given 1 dose of cefepime and vancomycin.  Patient otherwise asymptomatic (other than complaints of fatigue).  Do not think patient has endocarditis.  He has no clinical signs.  Exam is unremarkable.   Additional possibility could be rheumatologic in nature?  Patient with elevated sed rate despite being on chronic high-dose steroids.  A new autoimmune inflammatory condition may cause a fever, although patient with no other symptoms.   No reason for additional antibiotics.  Will follow blood cultures and if positive will check echocardiogram.  Will plan to discharge.   Addendum: Blood cultures final result negative   Fatigue Had extensive discussion with patient and his wife about his fatigue.  I have a strong suspicion that he may have contracted COVID last summer and since then, has been suffering the effects of long COVID which have caused his fatigue.  (If this is indeed long COVID, discussed with patient and wife about new trials looking at treating long COVID successfully with SSRIs.  This may be something to consider as per patient's PCP.)   See below in regards to prednisone and methotrexate.   For completeness sake, ordered CMV, Epstein-Barr, [place] spotted fever and Lyme disease titers to look for other causes of fatigue.  (Addendum: Following discharge, these titers all came back negative.)   Polymyalgia rheumatica (HCC) Patient has been on 5 mg of prednisone for his polymyalgia rheumatica for some time.  The viral illness that he contracted last summer triggered  a flareup causing joint pain.  This was treated with increasing prednisone dose to 25 mg daily.   There appears to be some confusion in the efficacy of this treatment.  Patient's joint pain did indeed respond to elevated prednisone dose, however attempts to reduce prednisone dose led to worsening of fatigue.  Had extensive  discussion with patient and wife that the fatigue and joint pain are likely unrelated.  Patient probably felt worse with prednisone decreased as prednisone can often cause a feeling of increased energy and wakefulness.   There may have been confusion between patient and providers that patient unable to wean off prednisone due to failure of therapy.  Therefore, discontinuing methotrexate.  Weaning down prednisone very slowly over the next month to get patient back to 5 mg p.o. daily.  Fatigue appears to be independent and as above recommended treatment.  INPATIENT PULM COJSULT 04/06/23   Acute Hypoxic Respiratory Failure in setting possibly of multifocal pna vs eosinophilic pna versus organizing pneumonia Acute PE  PMR on chronic prednisone, immunocompromised  Patient remains on high flow nasal cannula oxygen, unable to titrated down currently Continue IV antibiotics, currently on vancomycin and cefepime, infectious disease following Started on high-dose steroid with Solu-Medrol 125 mg every 6 hours with suspected inflammatory lung disease Follow-up autoimmune panel Will try to get sputum culture and sputum pneumocystis PCR Ideally patient needs bronchoscopy with BAL and transbronchial biopsy but due to high oxygen requirement, will currently hold off Continue subcu Lovenox therapeutic dose Monitor intake and output  Xxxx Renal visit [facility] 02/07/23  Pt reports he was diagnosed with PMR 30 years ago with large proximal aches. He was started on prednisone and felt immediately better. For the next 30 years he took 5 mg a day of prednisone and did very well. 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 In June of last year he developed a virus which consisted of profound fatigue and malaise. This caused his PMR to "explode". He had loss of energy and his sed rate went up. He no longer wanted to exercise. His ESR went up to 101 and his prednisone was increased to 30 mg daily. He felt somewhat better on  this higher dose but not back to normal.   Somewhere in this interim he developed a femoral nerve neuropathy that resolved with aggressive PT.   In August he developed covid with a 102 fever. He did not require hospitalization.   On 2/21 he followed up with rheumatology who recommended tapering the prednisone. He was cut down to 25 mg. He was started on weekly methotrexate but only took one dose.   He was admitted to [facility] at the end of February due to low BP (100 systolic; normal for him is mildly hypertensive), chills, and temp to 101-102. . In the emergency room, patient noted to have normal lactic acid and a white blood cell count of 14.5 as well as a sed rate of 78. During patient's entire hospitalization, was not febrile. Unclear etiology if he has any infection. Chest x-ray and urinalysis unremarkable. White blood cell count elevated, but mildly and in the context of patient being on high-dose of steroids continuously. Blood cultures drawn and patient was given 1 dose of cefepime and vancomycin. Patient otherwise asymptomatic (other than complaints of fatigue). They stopped the methotrexate at the ER.   Since the hospitalization he's been even more fatigued. Denies red eyes, ear pain, severe sinus symptoms, hemoptysis, SOB, chest pain, significant GI symptoms, difficulty urinating,  hematuria, leg swelling, skin rashes, fevers.  Eating/drinking fine. No typical PMR muscle pains, no joint pains. He does endorse night sweats requiring him to change his pajamas.   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 He remains very active with [employer]. He has been a lifelong athlete  Xxxxxxxxxxxxxxxxxxxxxxxxxxxxxxxxxx  [facility] RHEUM VISIT [phone]  Emersen Barcena is a 89 y.o. male with a history of PMR who presents for evaluation of fatigue, fevers, and hematuria noted to have a dilated thoracic aorta and abdominal aortic aneurysm, treated in past with endovascular repair. DDx for his presentation includes  possible large vessel vasculitis, infection of the aorta. We talked about the possibility of GCA today, but he does not have any cranial symptoms. GCA would also not explain the red cell casts in his urine today. He may have ANCA vasculitis, which would potentially tie together his lung nodules and urine findings. He is seeing Dr. Dolores Frame in ID today to rule out infectious causes given his recent fevers so out visit was abbreviated.  - check ANCA panel today - consider PET scan - taper prednisone - ID appointment today - Discussed with Dr. Alberteen Spindle potential admission to hospital to expedite workup   xxxxxxxxxxxxxxxxxxxxxxxxxxxxxxxxxxxxxxxxxxxxxxxxxxxxxxxxxxxxxxxxxxxxx Admit date: 03/05/2023  -Discharge date: 03/23/2023  ZAAKIR KIVI is a 89 y.o. male with medical history significant for polymyalgia rheumatica on chronic prednisone therapy, AAA status post endovascular stent graft in September 2018, chronic diastolic heart failure, recent diagnosis of aortitis, chronic leukocytosis, who is admitted to [facility] on 03/05/2023 with acute pulmonary embolism after presenting from home to [facility] ED complaining of generalized weakness.    [facility] for generalized weakness.  Subsequently, he was admitted to [facility] with similar initial complaint, before being discharged home on 02/13/2023.  During hospitalization at [facility], there was reportedly concern for potential aortitis, for which the patient was started on long-term current spectrum of antibiotics via PICC line, including daptomycin, Rocephin, and Bactrim.  He notes good interval compliance with these antibiotics.   However, over the last 1 to 2 days, he is noted progressive generalized weakness in the absence of any acute focal weakness.  He qualifies his generalized weakness is being significant enough to prevent him from getting out of bed over the last day, which is quite different  than his baseline functionality and activity level.  Denies any associated acute focal weakness.    He has a history of polymyalgia rheumatica and has been on chronic steroid therapy for multiple decades.  Recently, there have been outpatient attempts at reducing his dose of chronic prednisone, and he notes that the most recent dose adjustment down to 15 mg of daily prednisone occurred a few days prior to the onset of his presenting generalized weakness.   He denies any recent subjective fever, chills, rigors, or generalized myalgias.  Denies any new headache, neck stiffness, rash, cough, hemoptysis, shortness of breath, abdominal pain, diarrhea, dysuria or gross hematuria.  He also denies any recent chest pain, palpitations, diaphoresis, nausea, vomiting, dizziness, presyncope, or syncope.  No recent worsening of peripheral edema, or any recent/new lower extremity erythema or calf tenderness.  Denies any known history of prior pulmonary embolism.   Aside from the 2 hospitalizations over the last 6 weeks, denies any additional periods of prolonged diminished ambulatory activity as of recent.  No recent trauma.  No known history of underlying malignancy.  Not on any  blood thinners as an outpatient, including no aspirin.   Per chart review, it appears that he also has chronic leukocytosis with chart review revealing most recent prior white blood cell count of 18.8 on 02/28/2023, with other blood cell data points notable for 25.4 on 02/07/2023.   Denies any known baseline supplemental oxygen requirements.   ESR 105 compared to most recent prior value of 69 on 01/25/2023, CRP 12.8 compared to 75 on 02/28/2023. CBC notable for the following: Open cell count 20,000 with 89% neutroph     CTA chest, relative to CT chest from 12/02/2022, per formal radiology read, shows acute pulmonary embolism at the distal right main pulmonary artery extending into the right middle and lower pulmonary arteries, with CT evidence of  right heart strain.  Additionally, CTA chest shows pulmonary parenchymal findings suggestive of multifocal pneumonia superimposed on a background of interstitial lung disease as well as small bilateral pleural effusions.  CTA chest also shows unchanged aneurysmal dilation of the ascending, transverse, and descending thoracic aorta, without any corresponding evidence of dissection.   EDP discussed the patient's case with the on-call infectious disease physician, Dr. Zenaida Niece dam, he felt that acute pneumonia was slightly less likely given the patient's broad-spectrum IV antibiotics over the last several weeks, including daptomycin, Rocephin, and Bactrim.  He did not recommend changing daptomycin to IV vancomycin, but rather recommended continuation of daptomycin.  He did however recommend consideration for escalation of Rocephin to cefepime, with no additional changes recommended per infectious disease at this tim   Latest Reference Range & Units 03/06/23 21:40  ANA Ab, IFA  Negative  Anti JO-1 0.0 - 0.9 AI <0.2  CCP Antibodies IgG/IgA 0 - 19 units 2  RA Latex Turbid. <14.0 IU/mL 26.0 (H)  Cytoplasmic (C-ANCA) Neg:<1:20 titer <1:20  P-ANCA Neg:<1:20 titer <1:20  Atypical P-ANCA titer Neg:<1:20 titer <1:20  Anti-MPO Antibodies 0.0 - 0.9 units <0.2  Anti-PR3 Antibodies 0.0 - 0.9 units <0.2  C3 Complement 82 - 167 mg/dL 295  Complement C4, Body Fluid 12 - 38 mg/dL 28  SSA (Ro) (ENA) Antibody, IgG 0.0 - 0.9 AI <0.2  SSB (La) (ENA) Antibody, IgG 0.0 - 0.9 AI <0.2  Scleroderma (Scl-70) (ENA) Antibody, IgG 0.0 - 0.9 AI 0.4  (H): Data is abnormally high  Hospital Course:  #1 acute hypoxic respiratory failure in the setting of multifocal pneumonia versus eosinophilic pneumonia from daptomycin versus interstitial lung disease/autoimmune process/acute PE with right lower extremity DVT in the setting of PMR on chronic steroids/immunocompromise state -Patient was seen in consultation by ID, daptomycin  discontinued on 03/05/2023 due to concerns for possible eosinophilic pneumonia. -Patient with improvement currently on 1 L nasal cannula with sats of 97% at rest. -Patient noted to have been placed on high dose steroids which are being tapered down patient currently on prednisone 40 mg daily recommended to continue for the next 3 to 4 weeks at this dose until seen by PCCM in the outpatient setting. -ID recommended continuation of IV Rocephin, oral doxycycline, Bactrim for PJP prophylaxis. -Patient completed course of antibiotics 03/23/2023. -ID recommending continuation of Bactrim for pneumocystis prophylaxis while on high-dose steroids. -Will need outpatient follow-up with ID at [facility] postdischarge.   2.  Paroxysmal atrial fibrillation/atrial flutter -Seen in consultation by cardiology and felt likely secondary to acute pulmonary issues. -Noted to have converted on IV amiodarone which was continued for 24 hours and subsequently discontinued. -Patient subsequently maintained on low-dose Lopressor for rate control, Eliquis for anticoagulation.  3.  PE/right lower extremity DVT -Noted on CT angiogram chest. -Patient noted to have elevated troponins were likely secondary to demand ischemia. -Lower extremity Dopplers done positive for right lower extremity DVT. -2D echo done with abnormal septal motion, EF 55 to 60%,NWMA, no evidence of cor pulmonale in the setting of recent PE. -Patient initially placed on IV heparin and subsequently transition to Eliquis.   -Patient improved clinically.   -Patient will be discharged on Eliquis.   -Outpatient follow-up with pulmonary and PCP.    4.  AAA status post graft and possible aortitis -Patient noted to have been on daptomycin per ID at [facility] which was discontinued during this hospitalization due to concerns for eosinophilic pneumonia. -Patient received a full course of IV Rocephin and IV doxycycline which was completed during the hospitalization on day of  discharge.  -Patient also maintained on Bactrim for PJP prophylaxis per ID recommendations while on steroids.  Patient will be discharged on Bactrim for PJP prophylaxis. -Outpatient follow-up with ID at [facility].   5.  Acute delirium/anxiety -Possibly secondary to hospital delirium versus from hypoxia versus steroid-induced. -Improved clinically.      6.  PMR -Patient noted to chronically be on 5 mg of prednisone at home was on high-dose steroids and tapered down to 40 mg of prednisone daily secondary to problem #1 by day of discharge.  Patient be discharged on prednisone 40 mg daily until follow-up with pulmonary.     8.  Anemia of chronic disease -H&H stable. -No overt bleeding.   9. chronic leukocytosis -Patient noted to have a chronic leukocytosis could be secondary to steroid induced. -Labs with some improvement with leukocytosis by day of discharge.   -Patient completed course of antibiotics during the hospitalization, remained afebrile.   -Outpatient follow up with hematology for follow-up on leukocytosis.   -Ambulatory referral placed. -Patient will be discharged to a skilled nursing facility.   10.  GERD -Patient maintained on PPI, Pepcid, Zofran.         13.  Pressure injury, not present on admission Pressure Injury 03/12/23 Sacrum Mid Stage 1 -  Intact skin with non-blanchable redness of a localized area usually over a bony prominence. Pink non blanching on sacrum (Active)    Previous [facility] pulmonary encounter:  05/01/2023- Dr. Francine Graven     Chief Complaint  Patient presents with   Hospitalization Follow-up      HFU. Still using 4L of O2.     Samael Pines is a 89 year old male with history of polymyalgia rheumatica on chronic prednisone and recent hospitalization at [facility] where he was diagnosed with aortitis and started on daptomycin and rocephin via PICC line on 3/14. He presented to [facility] on 4/7 with generalized weakness and shortness of breath. CTA Chest on  admission showed pulmonary embolism and bilateral patchy ground-glass and consolidative opacities with interlobular septal thickening superimposed on back ground subpleural reticulation and bronchiolectasis. Echocardiogram did not show RV strain. He was started on heparin drip. He required transfer to the ICU for acute hypoxemic respiratory failure with heated high flow oxygen requirements. He was started on pulse dose steroids and broad spectrum antibiotics. Bronchoscopy was not performed due to his significant oxygen requirements. He was transferred out of the ICU on 4/18 with decreasing O2 requirements. He was discharged on 40mg  of prednisone daily and bactrim prophylaxis. He was started on nystatinc for thrush. He was discharged on 1-2L of oxygen and went to rehab.    He started  having bowel trouble at [facility] rehab for about a month with constipation. He has been started on aggressive bowel regimen and has required manual disimpaction per his wife. He had large explosive bowel movement in clinic today.    His prednisone was reduced to 20mg  daily at rehab due to concerns of intolerance of high dose therapy. He continues bactrim DS 1 tab 3 days per week.    He is working on walking with a walker, he walked 32 steps yesterday without assistance.    He has reduced appetite and is trying to drink protein supplements daily.     06/14/2023 - Interim hx Patient presents today for 1 month follow-up/acute office visit.  He was seen by Dr. Francine Graven on 05/01/2023 hospital follow-up due to respiratory failure in the setting of interstitial lung disease.  Patient has a history of Polymyalgia rheumatica on chronic prednisone.  Differential for respiratory failure includes eosinophilic pneumonia in the setting of daptomycin use versus progressive ILD process.  Patient has mild basilar reticulation on CT chest from March 2022.  Inflammatory workup has been negative.  Follow-up chest x-ray on 05/01/2023 showed patchy  bilateral interstitial and alveolar opacity bilaterally not significantly changed compared to prior chest x-ray from April 2024.    Accompanied by his wife and nursing aid today Breathing is about the same  He is on supplemental oxygen, for the most part he is using 1L oxygen at home  He is on 20mg  prednisone daily with Bactrim double strength 1 tablet 3 days/week for pneumocystitis prophylaxis.  Need to discuss prednisone taper if able    Patient had UTI symptoms (frequency/urgency, fever and confusion) on July 6. He had some air hunger at this time. No associated URI symptoms or cough. Prescribed abx Augmentin x 10 days and doxycycline x10 days. CXR done with Dr. Waynard Edwards, results are not accessible in Epic. He is doing better. He is more tired last several days. He is having some stomach/abdomen discomfort. He does struggle with constipation.  He had a small bowel movement today.  He has been compliant with stool softener.  Plan  - reduce prednisone to 15mg  per day  OV 07/04/2023 -transfer of care to Dr. Marchelle Gearing in the ILD center.  Subjective:  Patient ID: [name], male , DOB: Nov 05, 1930 , age 67 y.o. , MRN: [id number] , ADDRESS: [address] [place] [address] PCP [name], MD Patient Care Team: [name], MD as PCP - General (Internal Medicine) Little Ishikawa, MD as PCP - Cardiology (Cardiology) Holli Humbles, MD as Referring Physician (Ophthalmology) Pryor Ochoa, MD (Inactive) as Consulting Physician (Vascular Surgery) Hilarie Fredrickson, MD as Consulting Physician (Gastroenterology)  This Provider for this visit: Treatment Team:  Attending Provider: Kalman Shan, MD    07/04/2023 -   Chief Complaint  Patient presents with   Hospitalization Follow-up    New pt. And advise on health     HPI WILBERTH CARONNA 89 y.o. 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  Here with his wife Darl Pikes and also caretaker Bonita Quin.  History is provided by the  wife, caretaker, a little bit also from the patient also review of the external medical record.  All this was done on 07/03/2023.  Subsequent history also taken on 07/04/2023 the following day from [place].  Neither primary care physician.  The very complex story.  It appears that even at baseline he has had some mild atelectasis changes in his lower lung fields but this was essentially asymptomatic  and he was caring about his activities of daily living.   Has chronic history of polymyalgia rheumatica and has been on low-dose steroids for a few decades.  He has had persistently elevated ESR at least between 70-100 for the last 2 years according to PCP.  At some point because of possible PMR flare he was subjected increased prednisone dose but he always had this fatigue.   Review of the records indicate that starting February 2024 he started having leukocytosis and new onset anemia.  He did have an admission in the hospital at the time for fever.  Subsequently had extensive workup in the spring 2024 at [facility].  He had an admission for this.  They have an considered whether he might have aortitis.  This was in mid March 2024.  He was then treated with daptomycin in the hospital.  He was discharged in early April 2024 with a PICC line but shortly after that at home he collapsed.  Was diagnosed with submassive PE and interstitial pneumonitis.  He had no evidence of RV strain on the echo but his RV-LV ratio was 1.14.  Is on high flow oxygen.  Was given high-dose steroids.  During this time had ICU delirium including according to both primary care physician and D Vandam the infectious disease physician that I spoke to neuro suicidal ideations.  He was then discharged to [facility] where he spent 9 weeks and finally got home on 05/31/2023.  He is currently undergoing intense physical therapy at home.  During all this his high flow oxygen needs have improved he is such that he is only on 1-2 L nasal  cannula at rest.  The caretaker states that sometimes even at rest he is got normal pulse ox and he can go several hours without using oxygen.  He is able to get around with a walker but when he walks a certain distance such as 30 to 40 feet he will get extremely tired.   Overall the wife initially said that he was not any better but they did admit that he is now more conditioned.  He is also having reduced oxygen needs but the fatigue is the main component.  In talking to primary care physician as well fatigue seems to be the biggest component.  In fact a few days ago primary care physician called him in and had his hemoglobin checked it was 7 g%.  He just finished 2 units of transfusion.  He is somewhat better but still feeling extremely fatigued.  His leukocytosis continues and wife is frustrated about this and feels this might be associated with the fatigue.  In terms of his PMR and ILD his current prednisone treatment is at 15 mg/day.  This was slowly tapered since his hospitalization in April 2024.  Dr. Particia Jasper had instructed him to take 15 mg/day.  This was a clinical taper.  Despite different doses of prednisone his ESR is always continue to be high and the reason for this is not known.  In terms of pulmonary embolism he continues his Eliquis low dose His last echocardiogram was in April 2024.  His EF is 55 to 60% and had no evidence of RV strain.   OV 07/10/2023  Subjective:  Patient ID: [name], male , DOB: [birth year] , age 50 y.o. , MRN: [id number] , ADDRESS: [address] [place] [address] PCP [name], MD Patient Care Team: [name], MD as PCP - General (Internal Medicine)  Little Ishikawa, MD as PCP - Cardiology (Cardiology) Holli Humbles, MD as Referring Physician (Ophthalmology) Pryor Ochoa, MD (Inactive) as Consulting Physician (Vascular Surgery) Hilarie Fredrickson, MD as Consulting Physician (Gastroenterology)  This Provider for this  visit: Treatment Team:  Attending Provider: Kalman Shan, MD  Type of visit: Video Virtual Visit Identification of patient MELAKI ARGUMEDO with [birth year] and MRN [id number] - 2 person identifier Risks: Risks, benefits, limitations of telephone visit explained. Patient understood and verbalized agreement to proceed Anyone else on call: His wife.  Also the caretaker.  He himself could not join Patient location: His home This provider location: [address]; [place]; [place] [address]. [facility]. [phone]    07/10/2023 -follow-up respiratory failure.   HPI EH WAVER 89 y.o. - In this video visit the purpose is to discuss CT scan results and then to catch up.  I personally visualized the CT scan over time and also showed it to the wife and the caretaker.  He has had some basal chronic ILD changes even 2022 in 2021.  It was extremely mild and could have been passed off as atelectasis.  He also had this in early 2024.  However clearly in April 2024 he had significant groundglass opacities and alveolar airspace filling.  Dense consolidation.  This could easily have been Boop with a high ESR but then his ESR is been chronically elevated for over 2 years according to PCP.  He has responded to steroids.  His lungs have improved and left with residual scar although the level of residual scar seems worse than his baseline.  There is currently very little any groundglass opacities currently on 15 mg prednisone since mid July 2024.  He is stable on 1 L nasal cannula occasionally 2 L according to caretaker.  We took a shared decision making to reduce to 10 mg/day [his baseline was 5 mg to-10 mg/day for his PMR].  Did indicate to the wife there is a small chance that things could flareup but overall this chronic toxicity to deal with with prednisone and therefore we took a shared decision making to reduce.  Regarding his other issues such as fatigue, anemia, other  reasons for elevated ESR he is seeing hematology.  I have also spoken infectious disease doctor.  If he has a bone marrow biopsy they will do cultures.  His QuantiFERON gold was indeterminate.  I spoke to Dr. Algis Liming he does not think patient has TB.  I agree with that.  His anemia also appears to have improved.    OV 08/08/2023  Subjective:  Patient ID: [name], male , DOB: 06-26-1930 , age 64 y.o. , MRN: [id number] , ADDRESS: [address] [place] [address] PCP [name], MD Patient Care Team: [name], MD as PCP - General (Internal Medicine) Little Ishikawa, MD as PCP - Cardiology (Cardiology) Holli Humbles, MD as Referring Physician (Ophthalmology) Pryor Ochoa, MD (Inactive) as Consulting Physician (Vascular Surgery) Hilarie Fredrickson, MD as Consulting Physician (Gastroenterology)  This Provider for this visit: Treatment Team:  Attending Provider: Kalman Shan, MD    08/08/2023 -   Chief Complaint  Patient presents with   Follow-up    F/up on ILD     HPI ANNAS SCHOOF 89 y.o. -last visit was in mid August 2024.  In the interim I did run into his son and wife outside the hospital at a social event.  They reported that  fatigue continues although he is trying to ambulate and continue with physical therapy.  They have held off on the bone marrow biopsy because of concerns of pain.  The current focus is sto see progress iron infusions.  His most recent labs in our system is from 07/05/2023.  He continues to have normal renal function but he continues to be anemic with a hemoglobin 9.4 g% and is iron deficient.  His thrombocytosis continues.  And his ESR is elevated greater than 100.  He did visit Dr. Denzil Magnuson in cardiology on 07/19/2023 for his SVT was noticed to be doing stable on Toprol.  He was continuing his LOW DOSE Eliquis.  Given his anemia Dr. Yancey Flemings gastroenterologist on 07/24/2023 felt colonoscopy was putting him at high  risk.  Then on 08/03/2023 with complaints of abdominal pain.  Has had CT scan showed significant stool and consistent with constipation.  Today he presents with his caretaker Bonita Quin, wife and his son.  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  -Most recent visit 07/06/2023 with Dr. Algis Liming infectious diseases.  Infection considered low possibility but if bone marrow is ever done for his anemia recommended AFB cultures.  ll#Interstitial lung disease/dyspnea on exertion  -Had flareup in April 2022 associate with PE and also daptomycin.  [Daptomycin given for suspected aortitis at [facility]]  -He remains on oxygen 2 L nasal cannula.  But many times he has been observed to be saturating well when he is off oxygen.  The caretaker states that when she sometimes gives him a shower and helps him with that he is not desaturating when he is not on oxygen..  In fact today after we turned his oxygen off he was able to do a sit/stand test without any desaturations.  He is using oxygen at night but this has not helped his fatigue.  On his CT abdomen lung image August 03, 2023 the ILD is mild.  He is currently on 10 mg prednisone which is a lower dose compared to his last visit.  They are willing to get a taper down to 5 mg per my advice.  #History of pulm embolism April 2024 for submassive PE -     -He is on low-dose Eliquis noW.  I ordered an echo last time but the CMA indicated that this order was not placed.  Therefore have reordered this.  Will also check a D-dimer.  If there is no evidence of pulmonary hypertension this reduces risk for any anesthetic complications from any potential procedures.  #Iron deficiency anemia with heme positive stools.  Status post PRBC on 06/27/2023-Associated thrombocytosis and leukocytosis.    - Initial heme-onc consult 07/05/2023   There is shared decision making taken to continue with iron but through  a infusion form and see if he improves.  Last hemoglobin check was on that day 9.4 g%.  According to the family the iron infusions have not helped.  They deferred a bone marrow biopsy because of concerns of pain.  Although today they indicated that patient's 18 year old brother in [place] has had bone marrow biopsy recently and was able to tolerate it well.  -Most recent infusion was on 07/21/2023.   -CT abdomen September 2024 just showed a lot of stool and constipation   -Dr. Marina Goodell has indicated colonoscopy might be of significant risk although today has been normoxic on room air and was able to do sit and stand  test.-> discussed with him 08/08/2023 - feels supportive care best. He has been in touch with them  #Elevated ESR greater since February 2024 and [facility] medical records (according to primary care physician present for couple of years]   - No clear etiology known.  He in the past he has not responded to increase prednisone for polymyalgia rheumatica.  We will recheck this again today.  #History of SVT approximately atrial fibrillation April 2024 follows with Dr. Gaynelle Arabian  -Most recent visit was on 07/19/2023.  Noted to be on Toprol  #Elevated PSA 07/03/2023  - The [facility].  For the last 2 months he has now had a Foley catheter because of enlarged prostate they are not aware of a prostate cancer diagnosis.  I did reach out to Dr. Alvester Morin and have asked him to call me back but he has a chronic Foley for the last 2 months now.  No issues currently on this.   -Did discuss with Dr. Alvester Morin.  He does not think prostate cancer is a possibility.  Moreover with the age and no evidence of metastasis it is a nonissue.  He plans for IR embolization of the prostate to shrink it and have the Foley catheter come out.  #Physical deconditioning and fatigue   -His functional strength is improved he is able to transfer better.  He is able to sit stand.  He is  able to walk in the hallway or in the driveway with his walker.  However fatigue is significant.  The son took him to [employer] course and had him sit in the golf cart.  They watched for golf teams come by and after the patient got exhausted and had to go home despite eating a hot dog.   #g symptoms burden  -All significant and see below.  -I do not think the ILD is contributing to the symptom on the other hand is multifactorial and from anemia and high ESR and frailty and cachexia.  -His appetite is diminished but he is able to eat and not gaining weight though.  #Decub ulcer  -Being followed by wound care   #Goals of care  -Remains full code.  They have hesitated on undergoing procedures because of risk and bone marrow biopsy because of pain.   I did indicate to them that sometimes risk might have to be taken as long it is not undue to undergo certain procedures in order to establish clarity in the prognosis and diagnosis.  If reversible etiologies are found then he can improve but if your reversible etiologies are found then at least we have clarity.  They are processing this information. Ultimtely risk v beneift to be dcided by them/patoient an dproceduralists in shared decisin maknig         OV 08/29/2023  Subjective:  Patient ID: [name], male , DOB: [birth year] , age 89 y.o. , MRN: [id number] , ADDRESS: [address] [place] [address] PCP [name], MD Patient Care Team: [name], MD as PCP - General (Internal Medicine) Little Ishikawa, MD as PCP - Cardiology (Cardiology) Holli Humbles, MD as Referring Physician (Ophthalmology) Pryor Ochoa, MD (Inactive) as Consulting Physician (Vascular Surgery) Hilarie Fredrickson, MD as Consulting Physician (Gastroenterology)  This Provider for this visit: Treatment Team:  Attending Provider: Kalman Shan, MD    08/29/2023 -   Chief Complaint  Patient presents with   Follow-up  Echo f/u and labs      HPI CHUEYEE KOEPPEL 89 y.o. -Prsents with caretaker Bonita Quin and wife Darl Pikes. No hospitalizations since last visit. 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  [Daptomycin given for suspected aortitis at [facility] On 08/08/23: wsa able to sit/stand x5 on room air withou desaturations. So, we reduced his prednisone to 5mg  per day but he got too fatigued. Per wife and Bonita Quin even at 10mg  per day he ws fatigued. So he  is on 15mg  per day and they prefer to stay there. He is wearing 1L [place] now but RA pulse oxx 97%   #History of pulm embolism April 2024 for submassive PE -     -He is on low-dose Eliquis noW. D-dimer in July 2024: is 1.6 and Sept 2024 was 1.85. ECHO 08/28/23 shows Normal RV And is very reasuring. EF 50-55%. Aortic valve calcification +. Mild - mod stenosis . OPVerall reassuring  #Iron deficiency anemia with heme positive stools.  Status post PRBC on 06/27/2023 for a hemoglobin of 8.1 g%- #Associated thrombocytosis and leukocytosis.  Initial heme-onc consult 07/05/2023   -Did see Dr. Leonides Schanz and his physician assistant Karena Addison on 07/05/2023.  Instead of bone marrow biopsy decided to pursue iron infusions.  Last hemoglobin check showed improvement in hemoglobin to 10.4 g% on 08/08/2023 and is significantly improved.   -Most recent infusion was on 07/21/2023.   -CT abdomen August 03, 2023 just showed a lot of stool and constipation   -Dr. Marina Goodell has indicated colonoscopy might be of significant risk .  Most recent follow-up with Dr. Marina Goodell was 08/11/2023.  Continued conservative management has been recommended.  He  was reassured by the CT scan on August 03, 2023.  #Elevated ESR greater since February 2024 and [facility] medical records (according to primary care physician present for couple of years]    - No clear etiology known.  He in the past he has not responded to increase prednisone for polymyalgia rheumatica.  I recheck this again on 08/08/2023 and is again greater than 110.  Dr. Algis Liming does not think this from tuberculosis.  Discussed this with the patient and the family.  I will make a referral to [facility] rheumatology.  I also discussed Dr. Algis Liming who supports patient being seen by rheumatologist for this.  #History of SVT approximately atrial fibrillation April 2024 follows with Dr. Gaynelle Arabian   -Most recent visit was on 07/19/2023.  Noted to be on Toprol  #Elevated PSA 07/03/2023 with BPH   - The been following with [facility].  Since approximately June 2024 h per Dr. Alvester Morin in September 2024 prostate cancer is considered unlikely.  His plan was for interventional radiology embolization of the prostate to make it shrink it and then remove the Foley.  According to the family on 08/29/2019 for the not sure what the status of this is.  With BPH   #Physical deconditioning and fatigue   -His functional strength is improved he is able to transfer better.  He is able to sit stand.  He is  able to walk in the hallway or in the driveway with his walker.  However fatigue is significant.  The family did concede this is somewhat better.  [place] symptom assessment score is improved [see below].  #GI symptoms -Low appetite continues but is somewhat better - Nausea this continues to be a problem.  There is some better perhaps.  The amount of Zofran the using is less.  But is still a significant problem.  Wife thinks is associated with acid reflux.   #Decub ulcer  -Being followed by wound care   #Goals of care  -Remains full code.    OV 10/17/2023  Subjective:  Patient ID:  [name], male , DOB: 12/26/1929 , age 28 y.o. , MRN: [id number] , ADDRESS: [address] [place] [address] PCP [name], MD Patient Care Team: [name], MD as PCP - General (Internal Medicine) Little Ishikawa, MD as PCP - Cardiology (Cardiology) Holli Humbles, MD as Referring Physician (Ophthalmology) Pryor Ochoa, MD (Inactive) as Consulting Physician (Vascular Surgery) Hilarie Fredrickson, MD as Consulting Physician (Gastroenterology)  This Provider for this visit: Treatment Team:  Attending Provider: Kalman Shan, MD    10/17/2023 -   Chief Complaint  Patient presents with   Follow-up    F[facility]u after [facility] visit.   Mae Brys presents for follow-up with his wife Darl Pikes and caretaker.  No hospitalizations.  They have completed the [facility] visit for his high ESR.  He saw rheumatology.  He is now on methotrexate and is taken 1 weekly dose.  Details otherwise below.  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  [Daptomycin given for suspected aortitis at [facility] On 08/08/23: wsa able to sit/stand x5 on room air withou desaturations. So, we reduced his prednisone to 5mg  per day but he got too fatigued. Per wife and Bonita Quin even at 10mg  per day he ws fatigued.  Currently they went to the restaurant and when his oxygen came off used a little bit labored.  Today we turned the oxygen off and the finger pulse ox did not pick up oxygen levels but with this for probe he was 100% on room air at rest.    #History of pulm embolism April 2024 for submassive PE -     -He is on low-dose Eliquis noW. D-dimer in July 2024: is  1.6 and Sept 2024 was 1.85. ECHO 08/28/23 shows Normal RV And is very reasuring. EF 50-55%. Aortic valve calcification +. Mild - mod stenosis . OPVerall reassuring.  No bleeding issues.  #Iron deficiency anemia with heme positive stools.  Status post PRBC on 06/27/2023 for a hemoglobin of 8.1 g%- #Associated thrombocytosis and leukocytosis.  Initial heme-onc consult 07/05/2023   -Did see Dr. Leonides Schanz and his physician assistant Karena Addison on 07/05/2023.  Instead of bone marrow biopsy decided to pursue iron infusions.  Last hemoglobin check showed improvement in hemoglobin to 10.4 g% on 08/08/2023 and is significantly improved.   -Most recent infusion was on 07/21/2023.   -CT abdomen August 03, 2023 just showed a lot of stool and constipation   -Dr. Marina Goodell has indicated colonoscopy might  be of significant risk .  Most recent follow-up with Dr. Marina Goodell was 08/11/2023.  Continued conservative management has been recommended.  He was reassured by the CT scan on August 03, 2023.   -As of 10/18/2023 not getting blood transfusion iron infusions.  Hemoglobin 10/11/2019 was improved to 12.6 g%.  His color is better.   #Elevated ESR greater since February 2024 and [place] medical records (according to primary care physician present for couple of years]    - No clear etiology known.  He in the past he has not responded to increase prednisone for polymyalgia rheumatica.  I recheck this again on 08/08/2023 and is again greater than 110.  Dr. Algis Liming does not think this from tuberculosis.  At last visit referred to rheumatology.  He is did see Dr. Tami Ribas at [facility] rheumatology on 09/20/2023.  External records reviewed.  Diagnosis of PMR is on the chart but vasculitis/rheumatoid arthritis is under the differential.  ESR was not checked at [facility] but CRP was elevated.  Rheumatoid factor was trace positive.  He has been started on methotrexate.  I did indicate to them that methotrexate onset of action could be anywhere from 4-12  weeks.  The ultimate goal is to slowly taper the prednisone.  [facility] is handling this.   #History of SVT approximately atrial fibrillation April 2024 follows with Dr. Gaynelle Arabian   -Most recent visit was on 07/19/2023.  Noted to be on Toprol  #Elevated PSA 07/03/2023 with BPH   - The been following [facility].  Since approximately June 2024 h per Dr. Alvester Morin in September 2024 prostate cancer is considered unlikely.  His plan was for interventional radiology embolization of the prostate to make it shrink it and then remove the Foley.  According to the family on this visit 10/18/2023 he still has his Foley catheter.  They are not sure about the status of interventions here.   #Physical deconditioning and fatigue   -This is his biggest complaint of fatigue.  On this visit 10/18/2023 the family agreed that he is actually getting better.  He is now able to walk some distances without any help.  He is undergoing intense physical therapy exercises with [facility].  Today the patient felt little bit more fatigued but overall they do say that he is better.  His weight transfer and his physicality overall is better.  Still he was sitting in the wheelchair today.  #GI symptoms -Low appetite continues but is somewhat better - Nausea this continues to be a problem as of September 2024.Marland Kitchen  There is some better perhaps.  The amount of Zofran the using is less.  But is still a significant problem.  Wife thinks is associated with acid reflux.  On November 2024 ongoing and stable.   #Decub ulcer  -Being followed by wound care.  This was present in September 2024 but healed as of November 2020 for this visit.   #Goals of care  -Remains full code.  #Overall [place] symptom score appears to be stable/improving.   [place] Symptom Assessment Numerical Scale 0 is no problem -> 10 worst problem 08/08/2023   08/29/2023  10/17/2023   No Pain -> Worst pain 5 0 3  No Tiredness ->  Worset tiredness 8 7 3   No Nausea -> Worst nausea 7 6 6   No Depression -> Worst depression 5 7 5   No Anxiety -> Worst Anxiety 6 6 6   No Drowsiness -> Worst Drowsiness 0 5 0  Best appetite-> Worst Appetitle 5 4 5   Best Feeling of well being -> Worst feeling 6 3 1   No dyspnea-> Worst dyspnea 1 4 2   Other problem (none -> severe) x x x  Completed by  Care guver ad patietn Care giver Patient and care igive          CT Chest data from date: SPET 2024  - personally visualized and independently interpreted :YES - my findings are: MILD ild AT BASE   Simple office walk 224 (66+46 x 2) feet Pod A at [facility] x  3 laps goal with forehead probe 08/08/2023  08/29/2023  10/17/2023   O2 used ra ra   Number laps completed Sit stand x 5  Sitting in wheel chair   Comments about pace Slow but can do    Resting Pulse Ox/HR 100% and 125/min 97% RA At rest 100% RA at rest - forehead probe  Final Pulse Ox/HR 100% and 134/min    Desaturated </= 88% no    Desaturated <= 3% points no    Got Tachycardic >/= 90/min yes    Symptoms at end of test x    Miscellaneous comments tachycardia       PFT      No data to display            LAB RESULTS last 96 hours No results found.  LAB RESULTS last 90 days Recent Results (from the past 2160 hour(s))  QuantiFERON-TB Gold Plus     Status: Abnormal   Collection Time: 08/08/23 12:24 PM  Result Value Ref Range   QuantiFERON-TB Gold Plus INDETERMINATE (A) NEGATIVE    Comment: Results are indeterminate for  response to ESAT-6 and/or  CFP-10 test antigens.    NIL 0.01 IU/mL   Mitogen-NIL 0.03 IU/mL   TB1-NIL 0.00 IU/mL   TB2-NIL 0.00 IU/mL    Comment: . The Nil tube value reflects the background interferon gamma immune response of the patient's blood sample. This value has been subtracted from the patient's displayed TB and Mitogen results. . Lower than expected results with the Mitogen tube prevent false-negative Quantiferon readings  by detecting a patient with a potential immune suppressive condition and/or suboptimal pre-analytical specimen handling. . The TB1 Antigen tube is coated with the M. tuberculosis-specific antigens designed to elicit responses from TB antigen primed CD4+ helper T-lymphocytes. . The TB2 Antigen tube is coated with the M. tuberculosis-specific antigens designed to elicit responses from TB antigen primed CD4+ helper and CD8+ cytotoxic T-lymphocytes. . For additional information, please refer to https://education.[facility].com/faq/FAQ204 (This link is being provided for informational/ educational purposes only.) .   Hepatic function panel     Status: None   Collection Time: 08/08/23 12:24 PM  Result Value Ref Range   Total Bilirubin 0.4 0.2 - 1.2 mg/dL   Bilirubin, Direct 0.0 0.0 - 0.3 mg/dL   Alkaline Phosphatase 78 39 - 117 U/L   AST 18 0 - 37 U/L   ALT 15 0 - 53 U/L   Total Protein 7.3 6.0 - 8.3 g/dL   Albumin 3.5 3.5 - 5.2 g/dL  Basic Metabolic Panel (BMET)     Status: Abnormal   Collection Time: 08/08/23 12:24 PM  Result Value Ref Range   Sodium 141 135 - 145 mEq/L   Potassium 4.3 3.5 - 5.1 mEq/L   Chloride 104 96 - 112 mEq/L   CO2 30 19 - 32 mEq/L   Glucose, Bld 117 (H) 70 - 99 mg/dL  BUN 37 (H) 6 - 23 mg/dL   Creatinine, Ser 0.27 0.40 - 1.50 mg/dL   GFR 25.36 >64.40 mL/min    Comment: Calculated using the CKD-EPI Creatinine Equation (2021)   Calcium 10.0 8.4 - 10.5 mg/dL  CBC w/Diff     Status: Abnormal   Collection Time: 08/08/23 12:24 PM  Result Value Ref Range   WBC 14.7 (H) 4.0 - 10.5 K/uL   RBC 3.94 (L) 4.22 - 5.81 Mil/uL   Hemoglobin 10.4 (L) 13.0 - 17.0 g/dL   HCT 34.7 (L) 42.5 - 95.6 %   MCV 86.1 78.0 - 100.0 fl   MCHC 30.8 30.0 - 36.0 g/dL   RDW 38.7 (H) 56.4 - 33.2 %   Platelets 425.0 (H) 150.0 - 400.0 K/uL   Neutrophils Relative % 82.6 (H) 43.0 - 77.0 %   Lymphocytes Relative 9.0 (L) 12.0 - 46.0 %   Monocytes Relative 6.8 3.0 - 12.0 %    Eosinophils Relative 1.2 0.0 - 5.0 %   Basophils Relative 0.4 0.0 - 3.0 %   Neutro Abs 12.2 (H) 1.4 - 7.7 K/uL   Lymphs Abs 1.3 0.7 - 4.0 K/uL   Monocytes Absolute 1.0 0.1 - 1.0 K/uL   Eosinophils Absolute 0.2 0.0 - 0.7 K/uL   Basophils Absolute 0.1 0.0 - 0.1 K/uL  Sed Rate (ESR)     Status: Abnormal   Collection Time: 08/08/23 12:24 PM  Result Value Ref Range   Sed Rate 110 (H) 0 - 20 mm/hr  D-Dimer, Quantitative     Status: Abnormal   Collection Time: 08/08/23 12:24 PM  Result Value Ref Range   D-Dimer, Quant 1.85 (H) <0.50 mcg/mL FEU    Comment: . The D-Dimer test is used frequently to exclude an acute PE or DVT. In patients with a low to moderate clinical risk assessment and a D-Dimer result <0.50 mcg/mL FEU, the likelihood of a PE or DVT is very low. However, a thromboembolic event should not be excluded solely on the basis of the D-Dimer level. Increased levels of D-Dimer are associated with a PE, DVT, DIC, malignancies, inflammation, sepsis, surgery, trauma, pregnancy, and advancing patient age. [Jama 2006 11:295(2):199-207] . For additional information, please refer to: http://education.questdiagnostics.com/faq/FAQ149 (This link is being provided for informational/ educational purposes only) .   ECHOCARDIOGRAM COMPLETE     Status: None   Collection Time: 08/28/23 12:45 PM  Result Value Ref Range   Area-P 1/2 4.56 cm2   S' Lateral 2.80 cm   AV Area mean vel 2.57 cm2   AR max vel 2.62 cm2   AV Area VTI 2.75 cm2   Ao pk vel 2.08 m/s   AV Mean grad 12.0 mmHg   AV Peak grad 17.3 mmHg   Est EF 50 - 55%   Retic Panel     Status: Abnormal   Collection Time: 08/31/23  2:19 PM  Result Value Ref Range   Retic Ct Pct 1.6 0.4 - 3.1 %   RBC. 4.00 (L) 4.22 - 5.81 MIL/uL   Retic Count, Absolute 65.2 19.0 - 186.0 K/uL   Immature Retic Fract 12.7 2.3 - 15.9 %   Reticulocyte Hemoglobin 33.9 >27.9 pg    Comment:        Given the high negative predictive value of a RET-He  result > 32 pg iron deficiency is essentially excluded. If this patient is anemic other etiologies should be considered. Performed at [facility], [address] [place], [place] [address]  Iron and Iron Binding Capacity ([facility] only)     Status: Abnormal   Collection Time: 08/31/23  2:19 PM  Result Value Ref Range   Iron 38 (L) 45 - 182 ug/dL   TIBC 604 (L) 540 - 981 ug/dL   Saturation Ratios 16 (L) 17.9 - 39.5 %   UIBC 201 117 - 376 ug/dL    Comment: Performed at [facility], [address], [place], [place] [address]  Ferritin     Status: None   Collection Time: 08/31/23  2:19 PM  Result Value Ref Range   Ferritin 217 24 - 336 ng/mL    Comment: Performed at [facility], [address] [place], [place] [address]  CMP (Cancer Center only)     Status: Abnormal   Collection Time: 08/31/23  2:19 PM  Result Value Ref Range   Sodium 139 135 - 145 mmol/L   Potassium 4.9 3.5 - 5.1 mmol/L   Chloride 103 98 - 111 mmol/L   CO2 30 22 - 32 mmol/L   Glucose, Bld 155 (H) 70 - 99 mg/dL    Comment: Glucose reference range applies only to samples taken after fasting for at least 8 hours.   BUN 47 (H) 8 - 23 mg/dL   Creatinine 2.13 0.86 - 1.24 mg/dL   Calcium 57.8 8.9 - 46.9 mg/dL   Total Protein 7.1 6.5 - 8.1 g/dL   Albumin 3.7 3.5 - 5.0 g/dL   AST 17 15 - 41 U/L   ALT 22 0 - 44 U/L   Alkaline Phosphatase 88 38 - 126 U/L   Total Bilirubin 0.4 0.3 - 1.2 mg/dL   GFR, Estimated >62 >95 mL/min    Comment: (NOTE) Calculated using the CKD-EPI Creatinine Equation (2021)    Anion gap 6 5 - 15    Comment: Performed at [facility], [address], [place], [place] [address]  CBC with Differential (Cancer Center Only)     Status: Abnormal   Collection Time: 08/31/23  2:19 PM  Result Value Ref Range   WBC Count 17.0 (H) 4.0 - 10.5 K/uL   RBC 3.87 (L) 4.22 - 5.81 MIL/uL   Hemoglobin 11.4 (L)  13.0 - 17.0 g/dL   HCT 24.4 (L) 01.0 - 27.2 %   MCV 89.7 80.0 - 100.0 fL   MCH 29.5 26.0 - 34.0 pg   MCHC 32.9 30.0 - 36.0 g/dL   RDW 53.6 (H) 64.4 - 03.4 %   Platelet Count 357 150 - 400 K/uL   nRBC 0.0 0.0 - 0.2 %   Neutrophils Relative % 91 %   Neutro Abs 15.6 (H) 1.7 - 7.7 K/uL   Lymphocytes Relative 5 %   Lymphs Abs 0.8 0.7 - 4.0 K/uL   Monocytes Relative 3 %   Monocytes Absolute 0.5 0.1 - 1.0 K/uL   Eosinophils Relative 0 %   Eosinophils Absolute 0.0 0.0 - 0.5 K/uL   Basophils Relative 0 %   Basophils Absolute 0.0 0.0 - 0.1 K/uL   Immature Granulocytes 1 %   Abs Immature Granulocytes 0.13 (H) 0.00 - 0.07 K/uL    Comment: Performed at [facility], [address], [place], [place] [address]  Retic Panel     Status: Abnormal   Collection Time: 10/11/23 12:59 PM  Result Value Ref Range   Retic Ct Pct 1.2 0.4 - 3.1 %   RBC. 4.00 (L) 4.22 - 5.81 MIL/uL  Retic Count, Absolute 48.4 19.0 - 186.0 K/uL   Immature Retic Fract 11.1 2.3 - 15.9 %   Reticulocyte Hemoglobin 35.5 >27.9 pg    Comment:        Given the high negative predictive value of a RET-He result > 32 pg iron deficiency is essentially excluded. If this patient is anemic other etiologies should be considered. Performed at [facility], [address], [place], [place] [address]   Iron and Iron Binding Capacity ([facility] only)     Status: Abnormal   Collection Time: 10/11/23 12:59 PM  Result Value Ref Range   Iron 58 45 - 182 ug/dL   TIBC 696 (L) 295 - 284 ug/dL   Saturation Ratios 24 17.9 - 39.5 %   UIBC 184 117 - 376 ug/dL    Comment: Performed at [facility], [address], [place], [place] [address]  Ferritin     Status: None   Collection Time: 10/11/23 12:59 PM  Result Value Ref Range   Ferritin 86 24 - 336 ng/mL    Comment: Performed at [facility], [address] [place], [place] [address]  CMP (Cancer  Center only)     Status: Abnormal   Collection Time: 10/11/23 12:59 PM  Result Value Ref Range   Sodium 139 135 - 145 mmol/L   Potassium 4.4 3.5 - 5.1 mmol/L   Chloride 103 98 - 111 mmol/L   CO2 31 22 - 32 mmol/L   Glucose, Bld 111 (H) 70 - 99 mg/dL    Comment: Glucose reference range applies only to samples taken after fasting for at least 8 hours.   BUN 38 (H) 8 - 23 mg/dL   Creatinine 2.53 6.64 - 1.24 mg/dL   Calcium 40.3 8.9 - 47.4 mg/dL   Total Protein 6.9 6.5 - 8.1 g/dL   Albumin 3.6 3.5 - 5.0 g/dL   AST 18 15 - 41 U/L   ALT 16 0 - 44 U/L   Alkaline Phosphatase 80 38 - 126 U/L   Total Bilirubin 0.3 <1.2 mg/dL   GFR, Estimated >25 >95 mL/min    Comment: (NOTE) Calculated using the CKD-EPI Creatinine Equation (2021)    Anion gap 5 5 - 15    Comment: Performed at [facility], [address], [place], [place] [address]  CBC with Differential (Cancer Center Only)     Status: Abnormal   Collection Time: 10/11/23 12:59 PM  Result Value Ref Range   WBC Count 14.5 (H) 4.0 - 10.5 K/uL   RBC 4.11 (L) 4.22 - 5.81 MIL/uL   Hemoglobin 12.6 (L) 13.0 - 17.0 g/dL   HCT 64.3 (L) 32.9 - 51.8 %   MCV 94.4 80.0 - 100.0 fL   MCH 30.7 26.0 - 34.0 pg   MCHC 32.5 30.0 - 36.0 g/dL   RDW 84.1 (H) 66.0 - 63.0 %   Platelet Count 307 150 - 400 K/uL   nRBC 0.0 0.0 - 0.2 %   Neutrophils Relative % 83 %   Neutro Abs 12.1 (H) 1.7 - 7.7 K/uL   Lymphocytes Relative 8 %   Lymphs Abs 1.2 0.7 - 4.0 K/uL   Monocytes Relative 7 %   Monocytes Absolute 1.0 0.1 - 1.0 K/uL   Eosinophils Relative 1 %   Eosinophils Absolute 0.1 0.0 - 0.5 K/uL   Basophils Relative 0 %   Basophils Absolute 0.0 0.0 - 0.1 K/uL   Immature Granulocytes 1 %  Abs Immature Granulocytes 0.08 (H) 0.00 - 0.07 K/uL    Comment: Performed at [facility], [address], [place], [place] [address]         has a past medical history of AAA (abdominal aortic aneurysm) (HCC), Abnormal  EKG, Allergic rhinitis, Allergy, Benign neoplasm of colon (04/14/2010), Carpal tunnel syndrome, Cataract, Decubitus ulcer of coccygeal region, stage 2 (HCC) (07/06/2023), Degenerative disc disease, Disturbances metabolism of methionine, homocystine, and cystathionine (HCC), Elevated prostate specific antigen (PSA), Elevated PSA (07/06/2023), Hearing loss, Hyperlipidemia, ILD (interstitial lung disease) (HCC) (07/06/2023), Impotence of organic origin, Internal hemorrhoids, Leukocytosis (07/06/2023), Neck pain, Otosclerosis of both ears, Peripheral vascular disease (HCC), Plantar fasciitis, Polymyalgia rheumatica (HCC), Polymyalgia rheumatica (HCC) (07/06/2023), Rotator cuff syndrome of left shoulder, Scoliosis, and Shoulder pain.   reports that he has never smoked. He has never used smokeless tobacco.  Past Surgical History:  Procedure Laterality Date   ABDOMINAL AORTIC ENDOVASCULAR STENT GRAFT N/A 08/16/2017   Procedure: ABDOMINAL AORTIC ENDOVASCULAR STENT GRAFT insertion;  Surgeon: Nada Libman, MD;  Location: [facility] OR;  Service: Vascular;  Laterality: N/A;   Actinic keratosis removal  06/21/2010   Left shoulder; Dr. 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   Procedure: DUPUYTREN CONTRACTURE RELEASE RIGHT LONG, RING AND SMALL FINGERS;  Surgeon: Wyn Forster., MD;  Location: [facility];  Service: Orthopedics;  Laterality: Right;   Implant penile pump  2001   IR ANGIOGRAM PELVIS SELECTIVE OR SUPRASELECTIVE  12/11/2020   IR ANGIOGRAM SELECTIVE EACH ADDITIONAL VESSEL  12/11/2020   IR EMBO ARTERIAL NOT HEMORR HEMANG INC GUIDE ROADMAPPING  12/11/2020   IR RADIOLOGIST EVAL & MGMT  07/30/2020   IR RADIOLOGIST EVAL & MGMT  08/16/2023   IR US GUIDE VASC ACCESS RIGHT  12/11/2020   Resection of appendix and tip of rectum  February 2005   STAPEDECTOMY Bilateral 1985, 1988   [employer]    Allergies  Allergen Reactions    Daptomycin Other (See Comments)    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   Rosuvastatin Other (See Comments)    Stopped taking due to feeling achy     Codeine Nausea And Vomiting   Morphine And Codeine Nausea And Vomiting   Omnicef [Cefdinir] Other (See Comments)    Abdominal pain    Immunization History  Administered Date(s) Administered   Dtap, Unspecified 06/29/2010   Influenza, High Dose Seasonal PF 09/01/2017, 08/31/2022   Influenza, Quadrivalent, Recombinant, Inj, Pf 09/01/2018, 08/31/2019, 08/19/2020, 09/24/2021   Influenza,inj,Quad PF,6+ Mos 08/20/2013, 08/26/2014, 09/23/2015, 10/04/2016   Influenza-Unspecified 09/22/2006, 09/26/2007, 10/13/2012   PFIZER(Purple Top)SARS-COV-2 Vaccination 12/10/2019, 12/28/2019, 08/10/2020   Pneumococcal Conjugate-13 05/30/2018   Pneumococcal Polysaccharide-23 07/21/1998, 10/26/2022   Tdap 06/29/2010    Family History  Problem Relation Age of Onset   Heart disease Mother    Emphysema Mother    Leukemia Brother        Chronic lymphocytic leukemia   Colon cancer Neg Hx    Esophageal cancer Neg Hx    Rectal cancer Neg Hx    Stomach cancer Neg Hx      Current Outpatient Medications:    methotrexate (RHEUMATREX) 2.5 MG tablet, Take 2.5 mg by mouth once a week., Disp: , Rfl:    acetaminophen (TYLENOL) 325 MG tablet, Take 2 tablets (650 mg total) by mouth every 6 (six) hours as needed for mild pain (or Fever >/= 101)., Disp: ,  Rfl:    apixaban (ELIQUIS) 2.5 MG TABS tablet, Take 2.5 mg by mouth 2 (two) times daily., Disp: , Rfl:    bisacodyl 5 MG EC tablet, Take 10 mg by mouth daily as needed for moderate constipation., Disp: , Rfl:    butalbital-acetaminophen-caffeine (FIORICET) 50-325-40 MG tablet, Take 1 tablet by mouth daily as needed for headache., Disp: 14 tablet, Rfl: 0   cholecalciferol (VITAMIN D3) 25 MCG (1000 UNIT) tablet, Take 1,000 Units by mouth daily., Disp: , Rfl:    diclofenac Sodium (VOLTAREN) 1 % GEL, Apply 2 g topically 4  (four) times daily as needed (shoulder pain)., Disp: 2 g, Rfl: 0   docusate sodium (COLACE) 100 MG capsule, Take 100 mg by mouth 2 (two) times daily., Disp: , Rfl:    feeding supplement (ENSURE ENLIVE / ENSURE PLUS) LIQD, Take 237 mLs by mouth 3 (three) times daily between meals., Disp: 237 mL, Rfl: 12   finasteride (PROSCAR) 5 MG tablet, Take 5 mg by mouth daily., Disp: , Rfl:    fluorometholone (FML) 0.1 % ophthalmic suspension, Place 1 drop into the right eye at bedtime. , Disp: , Rfl:    L-Methylfolate-B12-B6-B2 (METAFOLBIC) 04-28-49-5 MG TABS, TAKE ONE TABLET TWICE DAILY (Patient taking differently: Take 1 tablet by mouth 2 (two) times daily.), Disp: 60 tablet, Rfl: 2   LORazepam (ATIVAN) 0.5 MG tablet, Take 0.5 mg by mouth daily. 0.25 am, 0.25 pm,  0.5 at night, Disp: , Rfl:    magnesium hydroxide (MILK OF MAGNESIA) 400 MG/5ML suspension, Take by mouth daily as needed for mild constipation., Disp: , Rfl:    melatonin 3 MG TABS tablet, Take 1 tablet (3 mg total) by mouth at bedtime. (Patient taking differently: Take 5 mg by mouth as needed.), Disp: , Rfl: 0   metoprolol succinate (TOPROL XL) 25 MG 24 hr tablet, Take 1 tablet (25 mg total) by mouth daily., Disp: 90 tablet, Rfl: 3   mirtazapine (REMERON) 15 MG tablet, Take 15 mg by mouth at bedtime. Pt taking 30 mg, Disp: , Rfl:    Multiple Vitamins-Minerals (MULTIVITAMIN PO), Take 1 tablet by mouth daily. , Disp: , Rfl:    ondansetron (ZOFRAN) 4 MG tablet, Take 1 tablet (4 mg total) by mouth every 4 (four) hours as needed for nausea or vomiting., Disp: 100 tablet, Rfl: 6   polyethylene glycol (MIRALAX / GLYCOLAX) 17 g packet, Take 17 g by mouth 2 (two) times daily., Disp: , Rfl:    predniSONE (DELTASONE) 20 MG tablet, Take 2 tablets (40 mg total) by mouth daily with breakfast. (Patient taking differently: Take 10 mg by mouth daily with breakfast. 15mg  daily), Disp: , Rfl:    promethazine (PHENERGAN) 12.5 MG tablet, Take 1 tablet (12.5 mg total) by  mouth daily as needed for nausea or vomiting., Disp: 30 tablet, Rfl: 6   psyllium (REGULOID) 0.52 g capsule, Take 0.52 g by mouth daily., Disp: , Rfl:    sertraline (ZOLOFT) 25 MG tablet, Take 1/2 tablet daily x 7 days, then increase to one tablet., Disp: 30 tablet, Rfl: 2   tamsulosin (FLOMAX) 0.4 MG CAPS capsule, Take 0.4 mg by mouth at bedtime., Disp: , Rfl:    Tart Cherry 1200 MG CAPS, Take 1 tablet by mouth daily., Disp: , Rfl:    traMADol (ULTRAM) 50 MG tablet, Take 50 mg by mouth every 4 (four) hours as needed for moderate pain or severe pain., Disp: , Rfl:    zolpidem (AMBIEN) 10 MG tablet, Take  5 mg by mouth at bedtime as needed for sleep., Disp: , Rfl:       Objective:   Vitals:   10/17/23 1148  BP: 104/70  Pulse: 85  SpO2: 96%  Weight: 148 lb 3.2 oz (67.2 kg)  Height: 6' (1.829 m)    Estimated body mass index is 20.1 kg/m as calculated from the following:   Height as of this encounter: 6' (1.829 m).   Weight as of this encounter: 148 lb 3.2 oz (67.2 kg).  @WEIGHTCHANGE @  [employer]   10/17/23 1148  Weight: 148 lb 3.2 oz (67.2 kg)     Physical Exam   General: No distress. Looks better.  O2 at rest: yes but does not need it at rest Cane present: no Sitting in wheel chair: YES Frail: YES but looking better Obese: no Neuro: Alert and Oriented x 3. GCS 15. Speech normal Psych: Pleasant Resp:  Barrel Chest - no.  Wheeze - no, Crackles - mild bsae, No overt respiratory distress CVS: Normal heart sounds. Murmurs - no Ext: Stigmata of Connective Tissue Disease - no HEENT: Normal upper airway. PEERL +. No post nasal drip        Assessment:       ICD-10-CM   1. Interstitial lung disease (HCC)  J84.9     2. Physical deconditioning  R53.81     3. ESR raised  R70.0     4. Anemia, unspecified type  D64.9          Plan:     Patient Instructions     ICD-10-CM   1. Interstitial lung disease (HCC)  J84.9     2. Physical deconditioning  R53.81       Interstitial lung disease (HCC) DOE  - you have inflammation in lung since 2021/2022: This flared up in April 2024 because of the PE and likely daptomycin. Possible covid last year 2023 played a role. Currently improved and needing room air to 1L at rest and night 1L at night. (Pusle ox 97% RA at rest 08/29/2023)  -  HRCT aug 2024 with improvement from April 2024 but with inreased residual scar compared to baseline -> CT sept 2024 wit residual scar  - Good oxyge level at rest 10/17/2023   Plan  - continue prednisone  but taper per Dr Tami Ribas at rheumatology at [facility] - too high risk for lung biopsy but could consider BAL sometime in future - too high risk for anti-fibrotics  -continue night o2 as before   Pulmonary embolism April 2024; submassive with RV/LV ratio 1.14  -Currently on Eliquis and tolerating well but having ongoing anemia that predated pulmonary embolism - noted per PCP stool occult blood positive August 2024 and on low dose eliquis since then -ECHO Sept 2024: normal RV function and improved  - no recurrence as of 10/17/2023    Plan - contiue low dose eliquis (Given anemia and stool occult blood positive)  Resting tachycardia  - probably multifactoria. Dr Jerene Pitch following. ECHO normal Sept 2024  Plan  -per Dr Jerene Pitch  Elevated ESR x 2  year per PCP [name], MD reported 07/04/2023.  Indeterminatate QUantiferon Gold - 2024 x 2 Unclear cause.  - No headachees. - Doubt TB per oral conversation with Dr Synthia Innocent of ID Aug 2024 Persists despite improvement in lungs - DDx PMR, GCA and non-speicific  - 110 on 08/08/23 and unable to reduce prednisne below 10mg  per day.  - saw [facility]: Dr Tami Ribas 09/20/23 and  started on Methotrexate -noted you have completed 1 cycle  Plan Hopefully methtorexate helps Prednisone and methotrexate per Dr Tami Ribas  Anemia and leukocytosis- new onset February 2024 and ongoing as of August 2024  - S/p 2 units packed red blood cells  early August 2024.  Per PCP [name], MD -stool occult blood positive late July/early August 2024. On Iron infusions as of 08/08/2023  - improved hgb to  > 12gm% In NOv 2024 - Dr Marina Goodell does not think endoscopy will be helpful in risk v benefit ratio  Plan - [facility] Dr Leonides Schanz f =   Elevated PSA 07/03/2023 -follows with Dr Modena Slater On Foley since July/August 2024 - unlikely prostate cancer per DR Alvester Morin Sept 2024  Plan  - per DR Alvester Morin   Physical deconditioning Fatigue  This is multifactorial -and likely due to posthospitalization, steroid related myopathy, medical illnesses [anemia, age, pulmonary issues] and age-related sarcopenia made worse by medical illnesses.. Ongoing 08/08/2023 and significant . Slolwy improving but still not baseline as of 10/17/2023   Plan  - continue PT - optimize anemia - per PCP [name], MD - let us see if methoterate helps  Goals of care  - home palliative care following. Currently full code  Plan - ongoing per PCP [name], MD    Followup  - face to face visit in  8 weeks or video visit ; 30 min to review   FOLLOWUP Return in about 8 weeks (around 12/12/2023) for 30 min visit, ILD, with Dr Marchelle Gearing.    SIGNATURE    Dr. Kalman Shan, M.D., F.C.C.P,  Pulmonary and Critical Care Medicine Staff Physician, [facility] Center Director - Interstitial Lung Disease  Program  Pulmonary Fibrosis [facility] at [facility] [place], [place], [address]  Pager: [phone], If no answer or between  15:00h - 7:00h: call [phone] Telephone: [phone]  5:36 PM 10/17/2023   Moderate Complexity MDM OFFICE  2021 E/M guidelines, first released in 2021, with minor revisions added in 2023 and 2024 Must meet the requirements for 2 out of 3 dimensions to qualify.    Number and complexity of problems addressed Amount and/or complexity of data reviewed Risk of complications and/or morbidity  One  or more chronic illness with mild exacerbation, OR progression, OR  side effects of treatment  Two or more stable chronic illnesses  One undiagnosed new problem with uncertain prognosis  One acute illness with systemic symptoms   One Acute complicated injury Must meet the requirements for 1 of 3 of the categories)  Category 1: Tests and documents, historian  Any combination of 3 of the following:  Assessment requiring an independent historian  Review of prior external note(s) from each unique source  Review of results of each unique test  Ordering of each unique test    Category 2: Interpretation of tests   Independent interpretation of a test performed by another physician/other qualified health care professional (not separately reported)  Category 3: Discuss management/tests  Discussion of management or test interpretation with external physician/other qualified health care professional/appropriate source (not separately reported) Moderate risk of morbidity from additional diagnostic testing or treatment Examples only:  Prescription drug management  Decision regarding minor surgery with identfied patient or procedure risk factors  Decision regarding elective major surgery without identified patient or procedure risk factors  Diagnosis or treatment significantly limited by social determinants of health  HIGh Complexity  OFFICE   2021 E/M guidelines, first released in 2021, with minor revisions added in 2023. Must meet the requirements for 2 out of 3 dimensions to qualify.    Number and complexity of problems addressed Amount and/or complexity of data reviewed Risk of complications and/or morbidity  Severe exacerbation of chronic illness  Acute or chronic illnesses that may pose a threat to life or bodily function, e.g., multiple trauma, acute MI, pulmonary embolus, severe respiratory distress, progressive rheumatoid arthritis, psychiatric illness  with potential threat to self or others, peritonitis, acute renal failure, abrupt change in neurological status Must meet the requirements for 2 of 3 of the categories)  Category 1: Tests and documents, historian  Any combination of 3 of the following:  Assessment requiring an independent historian  Review of prior external note(s) from each unique source  Review of results of each unique test  Ordering of each unique test    Category 2: Interpretation of tests    Independent interpretation of a test performed by another physician/other qualified health care professional (not separately reported)  Category 3: Discuss management/tests  Discussion of management or test interpretation with external physician/other qualified health care professional/appropriate source (not separately reported)  HIGH risk of morbidity from additional diagnostic testing or treatment Examples only:  Drug therapy requiring intensive monitoring for toxicity  Decision for elective major surgery with identified pateint or procedure risk factors  Decision regarding hospitalization or escalation of level of care  Decision for DNR or to de-escalate care   Parenteral controlled  substances            LEGEND - Independent interpretation involves the interpretation of a test for which there is a CPT code, and an interpretation or report is customary. When a review and interpretation of a test is performed and documented by the provider, but not separately reported (billed), then this would represent an independent interpretation. This report does not need to conform to the usual standards of a complete report of the test. This does not include interpretation of tests that do not have formal reports such as a complete blood count with differential and blood cultures. Examples would include reviewing a chest radiograph and documenting in the medical record an interpretation, but not separately  reporting (billing) the interpretation of the chest radiograph.   An appropriate source includes professionals who are not health care professionals but may be involved in the management of the patient, such as a Clinical research associate, upper officer, case manager or teacher, and does not include discussion with family or informal caregivers.    - SDOH: SDOH are the conditions in the environments where people are born, live, learn, work, play, worship, and age that affect a wide range of health, functioning, and quality-of-life outcomes and risks. (e.g., housing, food insecurity, transportation, etc.). SDOH-related Z codes ranging from Z55-Z65 are the ICD-10-CM diagnosis codes used to document SDOH data Z55 - Problems related to education and literacy Z56 - Problems related to employment and unemployment Z57 - Occupational exposure to risk factors Z58 - Problems related to physical environment Z59 - Problems related to housing and economic circumstances [phone] - Problems related to social environment [phone] - Problems related to upbringing [phone] - Other problems related to primary support group, including family circumstances Z22 - Problems related to certain psychosocial circumstances Z65 - Problems related to other psychosocial circumstances

## 2023-10-17 ENCOUNTER — Encounter: Payer: Self-pay | Admitting: Internal Medicine

## 2023-10-17 ENCOUNTER — Ambulatory Visit (INDEPENDENT_AMBULATORY_CARE_PROVIDER_SITE_OTHER): Payer: Medicare Other | Admitting: Internal Medicine

## 2023-10-17 VITALS — BP 104/70 | HR 85 | Ht 72.0 in | Wt 148.2 lb

## 2023-10-17 DIAGNOSIS — R5381 Other malaise: Secondary | ICD-10-CM

## 2023-10-17 DIAGNOSIS — J849 Interstitial pulmonary disease, unspecified: Secondary | ICD-10-CM

## 2023-10-17 DIAGNOSIS — R700 Elevated erythrocyte sedimentation rate: Secondary | ICD-10-CM | POA: Diagnosis not present

## 2023-10-17 DIAGNOSIS — D649 Anemia, unspecified: Secondary | ICD-10-CM

## 2023-10-17 NOTE — Patient Instructions (Addendum)
ICD-10-CM   1. Interstitial lung disease (HCC)  J84.9     2. Physical deconditioning  R53.81      Interstitial lung disease (HCC) DOE  - you have inflammation in lung since 2021/2022: This flared up in April 2024 because of the PE and likely daptomycin. Possible covid last year 2023 played a role. Currently improved and needing room air to 1L at rest and night 1L at night. (Pusle ox 97% RA at rest 08/29/2023)  -  HRCT aug 2024 with improvement from April 2024 but with inreased residual scar compared to baseline -> CT sept 2024 wit residual scar  - Good oxyge level at rest 10/17/2023   Plan  - continue prednisone  but taper per Dr Tami Ribas at rheumatology at [facility] - too high risk for lung biopsy but could consider BAL sometime in future - too high risk for anti-fibrotics  -continue night o2 as before   Pulmonary embolism April 2024; submassive with RV/LV ratio 1.14  -Currently on Eliquis and tolerating well but having ongoing anemia that predated pulmonary embolism - noted per PCP stool occult blood positive August 2024 and on low dose eliquis since then -ECHO Sept 2024: normal RV function and improved  - no recurrence as of 10/17/2023    Plan - contiue low dose eliquis (Given anemia and stool occult blood positive)  Resting tachycardia  - probably multifactoria. Dr Jerene Pitch following. ECHO normal Sept 2024  Plan  -per Dr Jerene Pitch  Elevated ESR x 2  year per PCP [name], MD reported 07/04/2023.  Indeterminatate QUantiferon Gold - 2024 x 2 Unclear cause.  - No headachees. - Doubt TB per oral conversation with Dr Synthia Innocent of ID Aug 2024 Persists despite improvement in lungs - DDx PMR, GCA and non-speicific  - 110 on 08/08/23 and unable to reduce prednisne below 10mg  per day.  - saw [facility]: Dr Tami Ribas 09/20/23 and started on Methotrexate -noted you have completed 1 cycle  Plan Hopefully methtorexate helps Prednisone and methotrexate per Dr Tami Ribas  Anemia and  leukocytosis- new onset February 2024 and ongoing as of August 2024  - S/p 2 units packed red blood cells early August 2024.  Per PCP [name], MD -stool occult blood positive late July/early August 2024. On Iron infusions as of 08/08/2023  - improved hgb to  > 12gm% In NOv 2024 - Dr Marina Goodell does not think endoscopy will be helpful in risk v benefit ratio  Plan - [facility] Dr Leonides Schanz f =   Elevated PSA 07/03/2023 -follows with Dr Modena Slater On Foley since July/August 2024 - unlikely prostate cancer per DR Alvester Morin Sept 2024  Plan  - per DR Alvester Morin   Physical deconditioning Fatigue  This is multifactorial -and likely due to posthospitalization, steroid related myopathy, medical illnesses [anemia, age, pulmonary issues] and age-related sarcopenia made worse by medical illnesses.. Ongoing 08/08/2023 and significant . Slolwy improving but still not baseline as of 10/17/2023   Plan  - continue PT - optimize anemia - per PCP [name], MD - let us see if methoterate helps  Goals of care  - home palliative care following. Currently full code  Plan - ongoing per PCP [name], MD    Followup  - face to face visit in  8 weeks or video visit ; 30 min to review

## 2023-10-19 DIAGNOSIS — M79605 Pain in left leg: Secondary | ICD-10-CM | POA: Diagnosis not present

## 2023-10-19 DIAGNOSIS — R5382 Chronic fatigue, unspecified: Secondary | ICD-10-CM | POA: Diagnosis not present

## 2023-10-19 DIAGNOSIS — M545 Low back pain, unspecified: Secondary | ICD-10-CM | POA: Diagnosis not present

## 2023-10-19 DIAGNOSIS — R269 Unspecified abnormalities of gait and mobility: Secondary | ICD-10-CM | POA: Diagnosis not present

## 2023-10-23 DIAGNOSIS — M545 Low back pain, unspecified: Secondary | ICD-10-CM | POA: Diagnosis not present

## 2023-10-23 DIAGNOSIS — R269 Unspecified abnormalities of gait and mobility: Secondary | ICD-10-CM | POA: Diagnosis not present

## 2023-10-23 DIAGNOSIS — R5382 Chronic fatigue, unspecified: Secondary | ICD-10-CM | POA: Diagnosis not present

## 2023-10-23 DIAGNOSIS — M79605 Pain in left leg: Secondary | ICD-10-CM | POA: Diagnosis not present

## 2023-10-25 DIAGNOSIS — M545 Low back pain, unspecified: Secondary | ICD-10-CM | POA: Diagnosis not present

## 2023-10-25 DIAGNOSIS — M79605 Pain in left leg: Secondary | ICD-10-CM | POA: Diagnosis not present

## 2023-10-25 DIAGNOSIS — R269 Unspecified abnormalities of gait and mobility: Secondary | ICD-10-CM | POA: Diagnosis not present

## 2023-10-25 DIAGNOSIS — R5382 Chronic fatigue, unspecified: Secondary | ICD-10-CM | POA: Diagnosis not present

## 2023-10-30 DIAGNOSIS — R5382 Chronic fatigue, unspecified: Secondary | ICD-10-CM | POA: Diagnosis not present

## 2023-10-30 DIAGNOSIS — R269 Unspecified abnormalities of gait and mobility: Secondary | ICD-10-CM | POA: Diagnosis not present

## 2023-10-30 DIAGNOSIS — M79605 Pain in left leg: Secondary | ICD-10-CM | POA: Diagnosis not present

## 2023-10-30 DIAGNOSIS — M545 Low back pain, unspecified: Secondary | ICD-10-CM | POA: Diagnosis not present

## 2023-10-31 DIAGNOSIS — R5382 Chronic fatigue, unspecified: Secondary | ICD-10-CM | POA: Diagnosis not present

## 2023-10-31 DIAGNOSIS — J9601 Acute respiratory failure with hypoxia: Secondary | ICD-10-CM | POA: Diagnosis not present

## 2023-10-31 DIAGNOSIS — M353 Polymyalgia rheumatica: Secondary | ICD-10-CM | POA: Diagnosis not present

## 2023-10-31 DIAGNOSIS — R110 Nausea: Secondary | ICD-10-CM | POA: Diagnosis not present

## 2023-10-31 DIAGNOSIS — F0390 Unspecified dementia without behavioral disturbance: Secondary | ICD-10-CM | POA: Diagnosis not present

## 2023-10-31 DIAGNOSIS — D638 Anemia in other chronic diseases classified elsewhere: Secondary | ICD-10-CM | POA: Diagnosis not present

## 2023-10-31 DIAGNOSIS — I480 Paroxysmal atrial fibrillation: Secondary | ICD-10-CM | POA: Diagnosis not present

## 2023-10-31 DIAGNOSIS — L98429 Non-pressure chronic ulcer of back with unspecified severity: Secondary | ICD-10-CM | POA: Diagnosis not present

## 2023-10-31 DIAGNOSIS — E2740 Unspecified adrenocortical insufficiency: Secondary | ICD-10-CM | POA: Diagnosis not present

## 2023-10-31 DIAGNOSIS — D72829 Elevated white blood cell count, unspecified: Secondary | ICD-10-CM | POA: Diagnosis not present

## 2023-11-01 DIAGNOSIS — R269 Unspecified abnormalities of gait and mobility: Secondary | ICD-10-CM | POA: Diagnosis not present

## 2023-11-01 DIAGNOSIS — R5382 Chronic fatigue, unspecified: Secondary | ICD-10-CM | POA: Diagnosis not present

## 2023-11-01 DIAGNOSIS — M79605 Pain in left leg: Secondary | ICD-10-CM | POA: Diagnosis not present

## 2023-11-01 DIAGNOSIS — M545 Low back pain, unspecified: Secondary | ICD-10-CM | POA: Diagnosis not present

## 2023-11-02 ENCOUNTER — Other Ambulatory Visit: Payer: Medicare Other

## 2023-11-02 ENCOUNTER — Ambulatory Visit
Admission: RE | Admit: 2023-11-02 | Discharge: 2023-11-02 | Disposition: A | Discharge status: Home / Self Care | Payer: Medicare Other | Source: Ambulatory Visit | Attending: Surgery | Admitting: Surgery

## 2023-11-02 DIAGNOSIS — I7143 Infrarenal abdominal aortic aneurysm, without rupture: Secondary | ICD-10-CM

## 2023-11-02 DIAGNOSIS — I7121 Aneurysm of the ascending aorta, without rupture: Secondary | ICD-10-CM | POA: Diagnosis not present

## 2023-11-02 MED ORDER — IOPAMIDOL (ISOVUE-370) INJECTION 76%
200.0000 mL | Freq: Once | INTRAVENOUS | Status: AC | PRN
  Administered 2023-11-02: 75 mL via INTRAVENOUS

## 2023-11-06 DIAGNOSIS — M79605 Pain in left leg: Secondary | ICD-10-CM | POA: Diagnosis not present

## 2023-11-06 DIAGNOSIS — R269 Unspecified abnormalities of gait and mobility: Secondary | ICD-10-CM | POA: Diagnosis not present

## 2023-11-06 DIAGNOSIS — M545 Low back pain, unspecified: Secondary | ICD-10-CM | POA: Diagnosis not present

## 2023-11-06 DIAGNOSIS — R5382 Chronic fatigue, unspecified: Secondary | ICD-10-CM | POA: Diagnosis not present

## 2023-11-08 DIAGNOSIS — R269 Unspecified abnormalities of gait and mobility: Secondary | ICD-10-CM | POA: Diagnosis not present

## 2023-11-08 DIAGNOSIS — R5382 Chronic fatigue, unspecified: Secondary | ICD-10-CM | POA: Diagnosis not present

## 2023-11-08 DIAGNOSIS — M545 Low back pain, unspecified: Secondary | ICD-10-CM | POA: Diagnosis not present

## 2023-11-08 DIAGNOSIS — M79605 Pain in left leg: Secondary | ICD-10-CM | POA: Diagnosis not present

## 2023-11-13 ENCOUNTER — Ambulatory Visit: Payer: Medicare Other | Admitting: Surgery

## 2023-11-14 DIAGNOSIS — M545 Low back pain, unspecified: Secondary | ICD-10-CM | POA: Diagnosis not present

## 2023-11-14 DIAGNOSIS — R5382 Chronic fatigue, unspecified: Secondary | ICD-10-CM | POA: Diagnosis not present

## 2023-11-14 DIAGNOSIS — M79605 Pain in left leg: Secondary | ICD-10-CM | POA: Diagnosis not present

## 2023-11-14 DIAGNOSIS — R269 Unspecified abnormalities of gait and mobility: Secondary | ICD-10-CM | POA: Diagnosis not present

## 2023-11-15 ENCOUNTER — Ambulatory Visit: Payer: Medicare Other | Attending: Cardiology | Admitting: Cardiology

## 2023-11-15 ENCOUNTER — Encounter: Payer: Self-pay | Admitting: Cardiology

## 2023-11-15 VITALS — BP 96/64 | HR 84 | Ht 72.0 in | Wt 156.0 lb

## 2023-11-15 DIAGNOSIS — I350 Nonrheumatic aortic (valve) stenosis: Secondary | ICD-10-CM | POA: Insufficient documentation

## 2023-11-15 DIAGNOSIS — I480 Paroxysmal atrial fibrillation: Secondary | ICD-10-CM | POA: Diagnosis not present

## 2023-11-15 DIAGNOSIS — I471 Supraventricular tachycardia, unspecified: Secondary | ICD-10-CM | POA: Insufficient documentation

## 2023-11-15 NOTE — Progress Notes (Signed)
Cardiology Office Note:    Date:  11/15/2023   ID:  [name], DOB [birth year], MRN [id number]  PCP:  [name], MD  Cardiologist:  Little Ishikawa, MD  Electrophysiologist:  None   Referring MD: [name], MD   Chief Complaint  Patient presents with   Atrial Fibrillation    History of Present Illness:    [name] is a 89 y.o. male with a hx of abdominal aortic aneurysm status post stent graft in 2018, chronic diastolic heart failure, pulmonary embolism 02/2023, hyperlipidemia, polymyalgia rheumatica who presents for follow-up.  He was referred by Dr Waynard Edwards for evaluation of chest pain, initially seen 04/28/2023.  Admitted for 03/05/2023 with acute hypoxic respiratory failure in setting of pneumonia and acute PE.  He was seen by cardiology during admission for atrial fibrillation, thought to be secondary to acute pulmonary issues.  He converted to sinus rhythm on IV amiodarone and was discharged on Lopressor and Eliquis.  Echocardiogram 03/06/2023 showed EF 55 to 60%, abnormal septal motion, normal RV size/systolic function.  Zio patch x 8 days 04/2023 showed 1966 episodes of SVT with longest lasting 2 minutes with average rate 160 bpm, 1 episode of NSVT lasting 8 beats, frequent PACs (6.2%) and occasional PVCs (2.3%).  Echocardiogram 07/2023 showed EF 50-55%, normal RV function, mild to moderate AS, dilatation of ascending aorta measuring 43mm.  Since last clinic visit, he reports that he is doing well.  Reports dyspnea improved. Denies any chest pain, lightheadedness, syncope, lower extremity edema, or palpitations.  Reports  SBP 90-130s at home.      Past Medical History:  Diagnosis Date   AAA (abdominal aortic aneurysm) (HCC)    Abnormal EKG    Left atrial abnormality   Allergic rhinitis    Allergy    Benign neoplasm of colon 04/14/2010   3 small polyps on colonoscopy by Dr. Marina Goodell   Carpal tunnel syndrome    Cataract    Decubitus ulcer of coccygeal region,  stage 2 (HCC) 07/06/2023   Degenerative disc disease    Disturbances metabolism of methionine, homocystine, and cystathionine (HCC)    Elevated homocysteine   Elevated prostate specific antigen (PSA)    Elevated PSA 07/06/2023   Hearing loss    Hyperlipidemia    ILD (interstitial lung disease) (HCC) 07/06/2023   Impotence of organic origin    Penile implant   Internal hemorrhoids    Leukocytosis 07/06/2023   Neck pain    Otosclerosis of both ears    Peripheral vascular disease (HCC)    Bilateral femoral bruit   Plantar fasciitis    Polymyalgia rheumatica (HCC)    Polymyalgia rheumatica (HCC) 07/06/2023   Rotator cuff syndrome of left shoulder    Scoliosis    Shoulder pain     Past Surgical History:  Procedure Laterality Date   ABDOMINAL AORTIC ENDOVASCULAR STENT GRAFT N/A 08/16/2017   Procedure: ABDOMINAL AORTIC ENDOVASCULAR STENT GRAFT insertion;  Surgeon: Nada Libman, MD;  Location: [facility] OR;  Service: Vascular;  Laterality: N/A;   Actinic keratosis removal  06/21/2010   Left shoulder; Dr. 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   Procedure: DUPUYTREN CONTRACTURE RELEASE RIGHT LONG, RING AND SMALL FINGERS;  Surgeon: Wyn Forster., MD;  Location: [facility];  Service: Orthopedics;  Laterality: Right;   Implant penile pump  2001   IR ANGIOGRAM PELVIS SELECTIVE OR SUPRASELECTIVE  12/11/2020   IR ANGIOGRAM SELECTIVE EACH ADDITIONAL VESSEL  12/11/2020   IR EMBO ARTERIAL NOT HEMORR HEMANG INC GUIDE ROADMAPPING  12/11/2020   IR RADIOLOGIST EVAL & MGMT  07/30/2020   IR RADIOLOGIST EVAL & MGMT  08/16/2023   IR US GUIDE VASC ACCESS RIGHT  12/11/2020   Resection of appendix and tip of rectum  February 2005   STAPEDECTOMY Bilateral 1985, 1988   [facility]    Current Medications: Current Meds  Medication Sig   acetaminophen (TYLENOL) 325 MG tablet Take 2 tablets (650 mg total) by mouth every 6  (six) hours as needed for mild pain (or Fever >/= 101).   apixaban (ELIQUIS) 2.5 MG TABS tablet Take 2.5 mg by mouth 2 (two) times daily.   bisacodyl 5 MG EC tablet Take 10 mg by mouth daily as needed for moderate constipation.   butalbital-acetaminophen-caffeine (FIORICET) 50-325-40 MG tablet Take 1 tablet by mouth daily as needed for headache.   cholecalciferol (VITAMIN D3) 25 MCG (1000 UNIT) tablet Take 1,000 Units by mouth daily.   diclofenac Sodium (VOLTAREN) 1 % GEL Apply 2 g topically 4 (four) times daily as needed (shoulder pain).   docusate sodium (COLACE) 100 MG capsule Take 100 mg by mouth daily.   feeding supplement (ENSURE ENLIVE / ENSURE PLUS) LIQD Take 237 mLs by mouth 3 (three) times daily between meals.   finasteride (PROSCAR) 5 MG tablet Take 5 mg by mouth daily.   fluorometholone (FML) 0.1 % ophthalmic suspension Place 1 drop into the right eye at bedtime.    folic acid (FOLVITE) 1 MG tablet Take 1 mg by mouth daily.   LORazepam (ATIVAN) 0.5 MG tablet Take 0.5 mg by mouth daily. 0.25 am, 0.25 pm,  0.5 at night   magnesium hydroxide (MILK OF MAGNESIA) 400 MG/5ML suspension Take by mouth daily as needed for mild constipation.   melatonin 3 MG TABS tablet Take 1 tablet (3 mg total) by mouth at bedtime. (Patient taking differently: Take 5 mg by mouth as needed.)   methotrexate (RHEUMATREX) 2.5 MG tablet Take 2.5 mg by mouth once a week.   metoprolol succinate (TOPROL XL) 25 MG 24 hr tablet Take 1 tablet (25 mg total) by mouth daily.   mirtazapine (REMERON) 15 MG tablet Take 15 mg by mouth at bedtime. Pt taking 30 mg   Multiple Vitamins-Minerals (MULTIVITAMIN PO) Take 1 tablet by mouth daily.    ondansetron (ZOFRAN) 4 MG tablet Take 1 tablet (4 mg total) by mouth every 4 (four) hours as needed for nausea or vomiting.   polyethylene glycol (MIRALAX / GLYCOLAX) 17 g packet Take 17 g by mouth 2 (two) times daily.   predniSONE (DELTASONE) 20 MG tablet Take 2 tablets (40 mg total) by  mouth daily with breakfast. (Patient taking differently: Take 10 mg by mouth daily with breakfast. 15mg  daily)   promethazine (PHENERGAN) 12.5 MG tablet Take 1 tablet (12.5 mg total) by mouth daily as needed for nausea or vomiting.   psyllium (REGULOID) 0.52 g capsule Take 0.52 g by mouth daily.   senna-docusate (SENNA-S) 8.6-50 MG tablet Take 1 tablet by mouth daily.   sertraline (ZOLOFT) 25 MG tablet Take 1/2 tablet daily x 7 days, then increase to one tablet.   tamsulosin (FLOMAX) 0.4 MG CAPS capsule Take 0.4 mg by mouth at bedtime.   Tart Cherry 1200 MG CAPS Take 1 tablet by mouth daily.   traMADol (ULTRAM) 50 MG tablet Take 50 mg by mouth every 4 (four)  hours as needed for moderate pain or severe pain.   zolpidem (AMBIEN) 10 MG tablet Take 5 mg by mouth at bedtime as needed for sleep.     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   Social Drivers of Corporate investment banker Strain: Low Risk  (03/07/2023)   Overall Financial Resource Strain (CARDIA)    Difficulty of Paying Living Expenses: Not hard at all  Food Insecurity: No Food Insecurity (03/15/2023)   Hunger Vital Sign    Worried About Running Out of Food in the  Last Year: Never true    Ran Out of Food in the Last Year: Never true  Transportation Needs: No Transportation Needs (03/15/2023)   PRAPARE - Administrator, Civil Service (Medical): No    Lack of Transportation (Non-Medical): No  Physical Activity: Not on file  Stress: Not on file  Social Connections: Not on file     Family History: The patient's family history includes Emphysema in his mother; Heart disease in his mother; Leukemia in his brother. There is no history of Colon cancer, Esophageal cancer, Rectal cancer, or Stomach cancer.  ROS:   Please see the history of present illness.     All other systems reviewed and are negative.  EKGs/Labs/Other Studies Reviewed:    The following studies were reviewed today:   EKG:   04/28/2023: Sinus tachycardia, PVCs, rate 109, LVH 11/15/23: NSR, rate 84, LVH  Recent Labs: 03/05/2023: B Natriuretic Peptide 121.7; TSH 1.869 03/22/2023: Magnesium 1.9 07/03/2023: Pro B Natriuretic peptide (BNP) 230.0 10/11/2023: ALT 16; BUN 38; Creatinine 0.97; Hemoglobin 12.6; Platelet Count 307; Potassium 4.4; Sodium 139  Recent Lipid Panel    Component Value Date/Time   CHOL 148 09/26/2016 0804   CHOL 138 03/21/2016 0819   TRIG 71 09/26/2016 0804   HDL 52 09/26/2016 0804   HDL 56 03/21/2016 0819   CHOLHDL 2.8 09/26/2016 0804   VLDL 14 09/26/2016 0804   LDLCALC 82 09/26/2016 0804   LDLCALC 68 03/21/2016 0819    Physical Exam:    VS:  BP 96/64   Pulse 84   Ht 6' (1.829 m)   Wt 156 lb (70.8 kg)   SpO2 99%   BMI 21.16 kg/m     Wt Readings from Last 3 Encounters:  11/15/23 156 lb (70.8 kg)  10/17/23 148 lb 3.2 oz (67.2 kg)  08/31/23 148 lb 6.4 oz (67.3 kg)     GEN:  Well nourished, well developed in no acute distress HEENT: Normal NECK: No JVD; No carotid bruits CARDIAC: RRR,2/6 systolic murmur RESPIRATORY:  Clear to auscultation without rales, wheezing or  rhonchi  ABDOMEN: Soft, non-tender, non-distended MUSCULOSKELETAL:  No  edema; No deformity  SKIN: Warm and dry NEUROLOGIC:  Alert and oriented x 3 PSYCHIATRIC:  Normal affect   ASSESSMENT:    1. PAF (paroxysmal atrial fibrillation) (HCC)   2. SVT (supraventricular tachycardia) (HCC)   3. Aortic valve stenosis, etiology of cardiac valve disease unspecified      PLAN:     Paroxysmal atrial fibrillation: Noted during hospitalization 02/2023.  Started on IV amiodarone and converted to sinus rhythm.  Discharged on Lopressor and Eliquis.  Echocardiogram 03/06/2023 showed EF 55 to 60%, abnormal septal motion, normal RV size/systolic function. -Continue Eliquis -Continue Toprol-XL 25 mg daily  SVT: Zio patch x 8 days 04/2023 showed 1966 episodes of SVT with longest lasting 2 minutes with average rate 160 bpm, 1 episode of NSVT lasting 8 beats, frequent PACs (6.2%) and occasional PVCs (2.3%). -Continue Toprol-XL 25 mg daily  Aortic stenosis: mild-moderate on echocardiogram 07/2023.  Will monitor, plan repeat echocardiogram in 1 year  PE/DVT: Admitted with acute PE 02/2023.  Continue Eliquis  ?  Aortitis: Completed course of antibiotics per ID 03/23/2023  AAA: Follows with vascular surgery, max aortic diameter 6.6 cm with delayed type II endoleak  Thoracic aortic aneurysm: Measures 5.1 cm, follows with vascular surgery  Anemia: Follows with heme-onc and GI, has been started on IV iron.  Hgb improved on recent labs, 12.6 09/2023  PMR: On prednisone  RTC in  6 months   Medication Adjustments/Labs and Tests Ordered: Current medicines are reviewed at length with the patient today.  Concerns regarding medicines are outlined above.  Orders Placed This Encounter  Procedures   EKG 12-Lead   No orders of the defined types were placed in this encounter.   Patient Instructions  Medication Instructions:  No changes   *If you need a refill on your cardiac medications before your next appointment, please call your pharmacy*   Lab Work: Not   needed     Testing/Procedures:  Not needed  Follow-Up: At [facility], you and your health needs are our priority.  As part of our continuing mission to provide you with exceptional heart care, we have created designated Provider Care Teams.  These Care Teams include your primary Cardiologist (physician) and Advanced Practice Providers (APPs -  Physician Assistants and Nurse Practitioners) who all work together to provide you with the care you need, when you need it.     Your next appointment:   6 month(s)  The format for your next appointment:   In Person  Provider:   Little Ishikawa, MD      Signed, Little Ishikawa, MD  11/15/2023 11:41 AM    [facility]

## 2023-11-15 NOTE — Patient Instructions (Signed)
Medication Instructions:  No changes  *If you need a refill on your cardiac medications before your next appointment, please call your pharmacy*   Lab Work: Not needed    Testing/Procedures: Not needed   Follow-Up: At [facility], you and your health needs are our priority.  As part of our continuing mission to provide you with exceptional heart care, we have created designated Provider Care Teams.  These Care Teams include your primary Cardiologist (physician) and Advanced Practice Providers (APPs -  Physician Assistants and Nurse Practitioners) who all work together to provide you with the care you need, when you need it.     Your next appointment:   6 month(s)  The format for your next appointment:   In Person  Provider:   Donato Heinz, MD

## 2023-11-16 DIAGNOSIS — Z961 Presence of intraocular lens: Secondary | ICD-10-CM | POA: Diagnosis not present

## 2023-11-16 DIAGNOSIS — R269 Unspecified abnormalities of gait and mobility: Secondary | ICD-10-CM | POA: Diagnosis not present

## 2023-11-16 DIAGNOSIS — M545 Low back pain, unspecified: Secondary | ICD-10-CM | POA: Diagnosis not present

## 2023-11-16 DIAGNOSIS — H34832 Tributary (branch) retinal vein occlusion, left eye, with macular edema: Secondary | ICD-10-CM | POA: Diagnosis not present

## 2023-11-16 DIAGNOSIS — M79605 Pain in left leg: Secondary | ICD-10-CM | POA: Diagnosis not present

## 2023-11-16 DIAGNOSIS — R5382 Chronic fatigue, unspecified: Secondary | ICD-10-CM | POA: Diagnosis not present

## 2023-11-20 DIAGNOSIS — R269 Unspecified abnormalities of gait and mobility: Secondary | ICD-10-CM | POA: Diagnosis not present

## 2023-11-20 DIAGNOSIS — M79605 Pain in left leg: Secondary | ICD-10-CM | POA: Diagnosis not present

## 2023-11-20 DIAGNOSIS — R5382 Chronic fatigue, unspecified: Secondary | ICD-10-CM | POA: Diagnosis not present

## 2023-11-20 DIAGNOSIS — M545 Low back pain, unspecified: Secondary | ICD-10-CM | POA: Diagnosis not present

## 2023-11-27 DIAGNOSIS — M545 Low back pain, unspecified: Secondary | ICD-10-CM | POA: Diagnosis not present

## 2023-11-27 DIAGNOSIS — R269 Unspecified abnormalities of gait and mobility: Secondary | ICD-10-CM | POA: Diagnosis not present

## 2023-11-27 DIAGNOSIS — R5382 Chronic fatigue, unspecified: Secondary | ICD-10-CM | POA: Diagnosis not present

## 2023-11-27 DIAGNOSIS — M79605 Pain in left leg: Secondary | ICD-10-CM | POA: Diagnosis not present

## 2023-11-30 DIAGNOSIS — M545 Low back pain, unspecified: Secondary | ICD-10-CM | POA: Diagnosis not present

## 2023-11-30 DIAGNOSIS — L570 Actinic keratosis: Secondary | ICD-10-CM | POA: Diagnosis not present

## 2023-11-30 DIAGNOSIS — R269 Unspecified abnormalities of gait and mobility: Secondary | ICD-10-CM | POA: Diagnosis not present

## 2023-11-30 DIAGNOSIS — D485 Neoplasm of uncertain behavior of skin: Secondary | ICD-10-CM | POA: Diagnosis not present

## 2023-11-30 DIAGNOSIS — Z85828 Personal history of other malignant neoplasm of skin: Secondary | ICD-10-CM | POA: Diagnosis not present

## 2023-11-30 DIAGNOSIS — M79605 Pain in left leg: Secondary | ICD-10-CM | POA: Diagnosis not present

## 2023-11-30 DIAGNOSIS — R5382 Chronic fatigue, unspecified: Secondary | ICD-10-CM | POA: Diagnosis not present

## 2023-11-30 DIAGNOSIS — C44529 Squamous cell carcinoma of skin of other part of trunk: Secondary | ICD-10-CM | POA: Diagnosis not present

## 2023-12-01 DIAGNOSIS — L98429 Non-pressure chronic ulcer of back with unspecified severity: Secondary | ICD-10-CM | POA: Diagnosis not present

## 2023-12-01 DIAGNOSIS — K5900 Constipation, unspecified: Secondary | ICD-10-CM | POA: Diagnosis not present

## 2023-12-01 DIAGNOSIS — E2740 Unspecified adrenocortical insufficiency: Secondary | ICD-10-CM | POA: Diagnosis not present

## 2023-12-01 DIAGNOSIS — M353 Polymyalgia rheumatica: Secondary | ICD-10-CM | POA: Diagnosis not present

## 2023-12-01 DIAGNOSIS — M533 Sacrococcygeal disorders, not elsewhere classified: Secondary | ICD-10-CM | POA: Diagnosis not present

## 2023-12-04 DIAGNOSIS — R5382 Chronic fatigue, unspecified: Secondary | ICD-10-CM | POA: Diagnosis not present

## 2023-12-04 DIAGNOSIS — R269 Unspecified abnormalities of gait and mobility: Secondary | ICD-10-CM | POA: Diagnosis not present

## 2023-12-04 DIAGNOSIS — M79605 Pain in left leg: Secondary | ICD-10-CM | POA: Diagnosis not present

## 2023-12-04 DIAGNOSIS — M545 Low back pain, unspecified: Secondary | ICD-10-CM | POA: Diagnosis not present

## 2023-12-06 ENCOUNTER — Inpatient Hospital Stay: Payer: Medicare Other | Admitting: Hematology and Oncology

## 2023-12-06 ENCOUNTER — Other Ambulatory Visit: Payer: Self-pay | Admitting: Hematology and Oncology

## 2023-12-06 ENCOUNTER — Inpatient Hospital Stay: Payer: Medicare Other | Attending: Physician Assistant

## 2023-12-06 VITALS — BP 100/59 | HR 79 | Temp 97.5°F | Resp 17 | Wt 161.5 lb

## 2023-12-06 DIAGNOSIS — M79605 Pain in left leg: Secondary | ICD-10-CM | POA: Diagnosis not present

## 2023-12-06 DIAGNOSIS — Z806 Family history of leukemia: Secondary | ICD-10-CM | POA: Diagnosis not present

## 2023-12-06 DIAGNOSIS — D72829 Elevated white blood cell count, unspecified: Secondary | ICD-10-CM | POA: Diagnosis not present

## 2023-12-06 DIAGNOSIS — D509 Iron deficiency anemia, unspecified: Secondary | ICD-10-CM | POA: Diagnosis not present

## 2023-12-06 DIAGNOSIS — Z7952 Long term (current) use of systemic steroids: Secondary | ICD-10-CM | POA: Insufficient documentation

## 2023-12-06 DIAGNOSIS — M353 Polymyalgia rheumatica: Secondary | ICD-10-CM

## 2023-12-06 DIAGNOSIS — D75839 Thrombocytosis, unspecified: Secondary | ICD-10-CM | POA: Diagnosis not present

## 2023-12-06 DIAGNOSIS — R269 Unspecified abnormalities of gait and mobility: Secondary | ICD-10-CM | POA: Diagnosis not present

## 2023-12-06 DIAGNOSIS — M545 Low back pain, unspecified: Secondary | ICD-10-CM | POA: Diagnosis not present

## 2023-12-06 DIAGNOSIS — D649 Anemia, unspecified: Secondary | ICD-10-CM | POA: Diagnosis not present

## 2023-12-06 DIAGNOSIS — R5382 Chronic fatigue, unspecified: Secondary | ICD-10-CM | POA: Diagnosis not present

## 2023-12-06 LAB — CBC WITH DIFFERENTIAL (CANCER CENTER ONLY)
Abs Immature Granulocytes: 0.21 10*3/uL — ABNORMAL HIGH (ref 0.00–0.07)
Basophils Absolute: 0.1 10*3/uL (ref 0.0–0.1)
Basophils Relative: 1 %
Eosinophils Absolute: 0 10*3/uL (ref 0.0–0.5)
Eosinophils Relative: 0 %
HCT: 37.7 % — ABNORMAL LOW (ref 39.0–52.0)
Hemoglobin: 13.1 g/dL (ref 13.0–17.0)
Immature Granulocytes: 1 %
Lymphocytes Relative: 7 %
Lymphs Abs: 1.1 10*3/uL (ref 0.7–4.0)
MCH: 33.6 pg (ref 26.0–34.0)
MCHC: 34.7 g/dL (ref 30.0–36.0)
MCV: 96.7 fL (ref 80.0–100.0)
Monocytes Absolute: 0.9 10*3/uL (ref 0.1–1.0)
Monocytes Relative: 5 %
Neutro Abs: 14.2 10*3/uL — ABNORMAL HIGH (ref 1.7–7.7)
Neutrophils Relative %: 86 %
Platelet Count: 329 10*3/uL (ref 150–400)
RBC: 3.9 MIL/uL — ABNORMAL LOW (ref 4.22–5.81)
RDW: 16.2 % — ABNORMAL HIGH (ref 11.5–15.5)
WBC Count: 16.5 10*3/uL — ABNORMAL HIGH (ref 4.0–10.5)
nRBC: 0 % (ref 0.0–0.2)

## 2023-12-06 LAB — CMP (CANCER CENTER ONLY)
ALT: 24 U/L (ref 0–44)
AST: 17 U/L (ref 15–41)
Albumin: 3.8 g/dL (ref 3.5–5.0)
Alkaline Phosphatase: 89 U/L (ref 38–126)
Anion gap: 8 (ref 5–15)
BUN: 40 mg/dL — ABNORMAL HIGH (ref 8–23)
CO2: 29 mmol/L (ref 22–32)
Calcium: 10.1 mg/dL (ref 8.9–10.3)
Chloride: 103 mmol/L (ref 98–111)
Creatinine: 1.13 mg/dL (ref 0.61–1.24)
GFR, Estimated: >60 mL/min (ref >60)
Glucose, Bld: 123 mg/dL — ABNORMAL HIGH (ref 70–99)
Potassium: 4.6 mmol/L (ref 3.5–5.1)
Sodium: 140 mmol/L (ref 135–145)
Total Bilirubin: 0.4 mg/dL (ref 0.0–1.2)
Total Protein: 7.2 g/dL (ref 6.5–8.1)

## 2023-12-06 LAB — IRON AND IRON BINDING CAPACITY (CC-WL,HP ONLY)
Iron: 53 ug/dL (ref 45–182)
Saturation Ratios: 18 % (ref 17.9–39.5)
TIBC: 295 ug/dL (ref 250–450)
UIBC: 242 ug/dL (ref 117–376)

## 2023-12-06 LAB — RETIC PANEL
Immature Retic Fract: 16.2 % — ABNORMAL HIGH (ref 2.3–15.9)
RBC.: 3.98 MIL/uL — ABNORMAL LOW (ref 4.22–5.81)
Retic Count, Absolute: 57.3 10*3/uL (ref 19.0–186.0)
Retic Ct Pct: 1.4 % (ref 0.4–3.1)
Reticulocyte Hemoglobin: 38.9 pg (ref >27.9)

## 2023-12-06 NOTE — Progress Notes (Signed)
 [facility] [phone]   [phone]  PROGRESS NOTE  Patient Care Team: Shayne Anes, MD as PCP - General (Internal Medicine) Kate Lonni CROME, MD as PCP - Cardiology (Cardiology) Caresse Cough, MD as Referring Physician (Ophthalmology) Gerlean Lynwood BIRCH, MD (Inactive) as Consulting Physician (Vascular Surgery) [name] Norleen SAILOR, MD as Consulting Physician (Gastroenterology)  Hematological/Oncological History # Leukocytosis/Thrombocytosis # Normocytic Anemia  06/14/2023: WBC 14.7, Hgb 8.1, MCV 82.7, Plt 584 06/27/2023 : received 2 units of PRBC  07/03/2023 : WBC 20.5, ANC 17.5, Hgb 9.9, MCV 82.6, Plt 566K, sed rate of 114  07/05/2023: establish care with Dr. Federico  07/21/2023: received 1 dose of IV monoferric  1000 mg  08/31/2023: WBC 17, Hgb 11.4, MCV 89.7, Plt 357  Interval History:  [name] 89 y.o. male with medical history significant for leukocytosis, thrombocytosis, normocytic anemia who presents for a follow up visit. The patient's last visit was on 08/31/2023. In the interim since the last visit he has had no major changes in his health.   On exam today Mr. Laday is accompanied by his wife and caretaker.  He reports that he has been good overall in the room since her last visit.  He was seen by rheumatologist at [facility] who started him up on methotrexate.  He reports he is feeling much better.  He continues his prednisone  50 mg p.o. daily.  He notes his energy levels today are quite good.  He has been working out in physical therapy and had an hour worth today.  He reports he is not having any lightheadedness, dizziness, shortness of breath.  He denies any nosebleeds or bleeding elsewhere.  He reports he is eating well and doing his best to try to eat red meat and steak.SABRA  He denies any infectious symptoms such as runny nose, sore throat, cough.  No reports of fevers, chills, sweats, nausea, vomiting or diarrhea.  A full 10 point ROS is  otherwise negative.  MEDICAL HISTORY:  Past Medical History:  Diagnosis Date   AAA (abdominal aortic aneurysm) (HCC)    Abnormal EKG    Left atrial abnormality   Allergic rhinitis    Allergy    Benign neoplasm of colon 04/14/2010   3 small polyps on colonoscopy by Dr. [name]   Carpal tunnel syndrome    Cataract    Decubitus ulcer of coccygeal region, stage 2 (HCC) 07/06/2023   Degenerative disc disease    Disturbances metabolism of methionine, homocystine, and cystathionine (HCC)    Elevated homocysteine   Elevated prostate specific antigen (PSA)    Elevated PSA 07/06/2023   Hearing loss    Hyperlipidemia    ILD (interstitial lung disease) (HCC) 07/06/2023   Impotence of organic origin    Penile implant   Internal hemorrhoids    Leukocytosis 07/06/2023   Neck pain    Otosclerosis of both ears    Peripheral vascular disease (HCC)    Bilateral femoral bruit   Plantar fasciitis    Polymyalgia rheumatica (HCC)    Polymyalgia rheumatica (HCC) 07/06/2023   Rotator cuff syndrome of left shoulder    Scoliosis    Shoulder pain     SURGICAL HISTORY: Past Surgical History:  Procedure Laterality Date   ABDOMINAL AORTIC ENDOVASCULAR STENT GRAFT N/A 08/16/2017   Procedure: ABDOMINAL AORTIC ENDOVASCULAR STENT GRAFT insertion;  Surgeon: Serene Gaile ORN, MD;  Location: [facility] OR;  Service: Vascular;  Laterality: N/A;   Actinic keratosis removal  06/21/2010   Left shoulder;  Dr. Jadine   COLONOSCOPY     COLONOSCOPY W/ POLYPECTOMY     DUPUYTREN CONTRACTURE RELEASE Right 12/05/2013   Procedure: DUPUYTREN CONTRACTURE RELEASE RIGHT LONG, RING AND SMALL FINGERS;  Surgeon: [name]., MD;  Location: [facility];  Service: Orthopedics;  Laterality: Right;   Implant penile pump  2001   IR ANGIOGRAM PELVIS SELECTIVE OR SUPRASELECTIVE  12/11/2020   IR ANGIOGRAM SELECTIVE EACH ADDITIONAL VESSEL  12/11/2020   IR EMBO ARTERIAL NOT HEMORR HEMANG INC GUIDE ROADMAPPING  12/11/2020    IR RADIOLOGIST EVAL & MGMT  07/30/2020   IR RADIOLOGIST EVAL & MGMT  08/16/2023   IR US  GUIDE VASC ACCESS RIGHT  12/11/2020   Resection of appendix and tip of rectum  February 2005   STAPEDECTOMY Bilateral 1985, 1988   [employer]    SOCIAL HISTORY: Social History   Socioeconomic History   Marital status: Married    Spouse name: Rajeev Escue   Number of children: 2   Years of education: Not on file   Highest education level: Not on file  Occupational History   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   Social Drivers of Corporate Investment Banker Strain: Low Risk  (03/07/2023)   Overall Financial Resource Strain (CARDIA)    Difficulty of Paying Living Expenses: Not hard at all  Food Insecurity: No Food Insecurity (03/15/2023)   Hunger Vital Sign    Worried About Running Out of Food in the Last Year: Never true    Ran Out of Food in the Last Year: Never true  Transportation Needs: No Transportation Needs (03/15/2023)   PRAPARE - Administrator, Civil Service (Medical): No    Lack of Transportation (Non-Medical): No  Physical Activity: Not on file  Stress: Not on file  Social Connections: Not on file  Intimate Partner Violence: Not At Risk (03/15/2023)   Humiliation, Afraid, Rape, and Kick questionnaire    Fear of Current or Ex-Partner: No    Emotionally Abused: No    Physically Abused: No    Sexually Abused: No    FAMILY HISTORY: Family History  Problem Relation Age of Onset   Heart disease Mother     Emphysema Mother    Leukemia Brother        Chronic lymphocytic leukemia   Colon cancer Neg Hx    Esophageal cancer Neg Hx    Rectal cancer Neg Hx    Stomach cancer Neg Hx     ALLERGIES:  is allergic to daptomycin , rosuvastatin , codeine, morphine  and codeine, and omnicef [cefdinir].  MEDICATIONS:  Current Outpatient Medications  Medication Sig Dispense Refill   acetaminophen  (TYLENOL ) 325 MG tablet Take 2 tablets (650 mg total) by mouth every 6 (six) hours as needed for mild pain (or Fever >/= 101).     apixaban  (ELIQUIS ) 2.5 MG TABS tablet Take 2.5 mg by mouth 2 (two) times daily.     bisacodyl 5 MG EC  tablet Take 10 mg by mouth daily as needed for moderate constipation.     butalbital -acetaminophen -caffeine  (FIORICET ) 50-325-40 MG tablet Take 1 tablet by mouth daily as needed for headache. 14 tablet 0   cholecalciferol (VITAMIN D3) 25 MCG (1000 UNIT) tablet Take 1,000 Units by mouth daily.     diclofenac  Sodium (VOLTAREN ) 1 % GEL Apply 2 g topically 4 (four) times daily as needed (shoulder pain). 2 g 0   docusate sodium  (COLACE) 100 MG capsule Take 100 mg by mouth daily.     feeding supplement (ENSURE ENLIVE / ENSURE PLUS) LIQD Take 237 mLs by mouth 3 (three) times daily between meals. 237 mL 12   finasteride  (PROSCAR ) 5 MG tablet Take 5 mg by mouth daily.     fluorometholone  (FML) 0.1 % ophthalmic suspension Place 1 drop into the right eye at bedtime.      folic acid  (FOLVITE ) 1 MG tablet Take 1 mg by mouth daily.     L-Methylfolate-B12-B6-B2 (METAFOLBIC) 04-28-49-5 MG TABS TAKE ONE TABLET TWICE DAILY (Patient not taking: Reported on 11/15/2023) 60 tablet 2   LORazepam  (ATIVAN ) 0.5 MG tablet Take 0.5 mg by mouth daily. 0.25 am, 0.25 pm,  0.5 at night     magnesium  hydroxide (MILK OF MAGNESIA) 400 MG/5ML suspension Take by mouth daily as needed for mild constipation.     melatonin 3 MG TABS tablet Take 1 tablet (3 mg total) by mouth at bedtime. (Patient taking differently: Take 5 mg by  mouth as needed.)  0   methotrexate (RHEUMATREX) 2.5 MG tablet Take 2.5 mg by mouth once a week.     metoprolol  succinate (TOPROL  XL) 25 MG 24 hr tablet Take 1 tablet (25 mg total) by mouth daily. 90 tablet 3   mirtazapine  (REMERON ) 15 MG tablet Take 15 mg by mouth at bedtime. Pt taking 30 mg     Multiple Vitamins-Minerals (MULTIVITAMIN PO) Take 1 tablet by mouth daily.      ondansetron  (ZOFRAN ) 4 MG tablet Take 1 tablet (4 mg total) by mouth every 4 (four) hours as needed for nausea or vomiting. 100 tablet 6   polyethylene glycol (MIRALAX  / GLYCOLAX ) 17 g packet Take 17 g by mouth 2 (two) times daily.     predniSONE  (DELTASONE ) 20 MG tablet Take 2 tablets (40 mg total) by mouth daily with breakfast. (Patient taking differently: Take 10 mg by mouth daily with breakfast. 15mg  daily)     promethazine  (PHENERGAN ) 12.5 MG tablet Take 1 tablet (12.5 mg total) by mouth daily as needed for nausea or vomiting. 30 tablet 6   psyllium (REGULOID) 0.52 g capsule Take 0.52 g by mouth daily.     senna-docusate (SENNA-S) 8.6-50 MG tablet Take 1 tablet by mouth daily.     sertraline  (ZOLOFT ) 25 MG tablet Take 1/2 tablet daily x 7 days, then increase to one tablet. 30 tablet 2   tamsulosin  (FLOMAX ) 0.4 MG CAPS capsule Take 0.4 mg by mouth at bedtime.     Tart Cherry 1200 MG CAPS Take 1 tablet by mouth daily.     traMADol  (ULTRAM ) 50 MG tablet Take 50 mg by mouth every 4 (four) hours as needed for moderate pain or severe pain.     zolpidem  (AMBIEN ) 10 MG tablet Take 5 mg by mouth at bedtime as needed for sleep.     No current facility-administered medications for this visit.    REVIEW OF SYSTEMS:   Constitutional: ( - ) fevers, ( - )  chills , ( - )  night sweats Eyes: ( - ) blurriness of vision, ( - ) double vision, ( - ) watery eyes Ears, nose, mouth, throat, and face: ( - ) mucositis, ( - ) sore throat Respiratory: ( - ) cough, ( - ) dyspnea, ( - ) wheezes Cardiovascular: ( - ) palpitation, ( - ) chest  discomfort, ( - ) lower extremity swelling Gastrointestinal:  ( - ) nausea, ( - ) heartburn, ( - ) change in bowel habits Skin: ( - ) abnormal skin rashes Lymphatics: ( - ) new lymphadenopathy, ( - ) easy bruising Neurological: ( - ) numbness, ( - ) tingling, ( - ) new weaknesses Behavioral/Psych: ( - ) mood change, ( - ) new changes  All other systems were reviewed with the patient and are negative.  PHYSICAL EXAMINATION:  Vitals:   12/06/23 1511  BP: (!) 100/59  Pulse: 79  Resp: 17  Temp: (!) 97.5 F (36.4 C)  SpO2: 100%    Filed Weights   12/06/23 1511  Weight: 161 lb 8 oz (73.3 kg)     GENERAL: well appearing elderly Caucasian male, alert, no distress and comfortable SKIN: skin color, texture, turgor are normal, no rashes or significant lesions EYES: conjunctiva are pink and non-injected, sclera clear LUNGS: clear to auscultation and percussion with normal breathing effort HEART: regular rate & rhythm and no murmurs and no lower extremity edema Musculoskeletal: no cyanosis of digits and no clubbing  PSYCH: alert & oriented x 3, fluent speech NEURO: no focal motor/sensory deficits  LABORATORY DATA:  I have reviewed the data as listed    Latest Ref Rng & Units 12/06/2023    2:53 PM 10/11/2023   12:59 PM 08/31/2023    2:19 PM  CBC  WBC 4.0 - 10.5 K/uL 16.5  14.5  17.0   Hemoglobin 13.0 - 17.0 g/dL 86.8  87.3  88.5   Hematocrit 39.0 - 52.0 % 37.7  38.8  34.7   Platelets 150 - 400 K/uL 329  307  357        Latest Ref Rng & Units 12/06/2023    2:53 PM 10/11/2023   12:59 PM 08/31/2023    2:19 PM  CMP  Glucose 70 - 99 mg/dL 876  888  844   BUN 8 - 23 mg/dL 40  38  47   Creatinine 0.61 - 1.24 mg/dL 8.86  9.02  9.05   Sodium 135 - 145 mmol/L 140  139  139   Potassium 3.5 - 5.1 mmol/L 4.6  4.4  4.9   Chloride 98 - 111 mmol/L 103  103  103   CO2 22 - 32 mmol/L 29  31  30    Calcium  8.9 - 10.3 mg/dL 89.8  89.9  89.7   Total Protein 6.5 - 8.1 g/dL 7.2  6.9  7.1   Total  Bilirubin 0.0 - 1.2 mg/dL 0.4  0.3  0.4   Alkaline Phos 38 - 126 U/L 89  80  88   AST 15 - 41 U/L 17  18  17    ALT 0 - 44 U/L 24  16  22      Lab Results  Component Value Date   MPROTEIN 0.3 (H) 07/05/2023   Lab Results  Component Value Date   KPAFRELGTCHN 84.4 (H) 07/05/2023   LAMBDASER 24.2 07/05/2023   KAPLAMBRATIO 3.49 (H) 07/05/2023   RADIOGRAPHIC STUDIES: No results found.  ASSESSMENT & PLAN [name] 89 y.o. male with medical history significant for  leukocytosis, thrombocytosis, normocytic anemia who presents for a follow up visit.  #Normocytic Anemia-Improving # Thrombocytosis-resolved # Leukocytosis- improving -- labs today show white blood cell 16.5, hemoglobin 13.1, MCV 96.7, platelets 329 -- Leukocytosis of neutrophilic predominant remains persistent, likely secondary to underlying inflammatory disorder and steroid use -- Thrombocytosis has resolved with IV iron infusion. -- At this time findings appear most consistent with iron deficiency anemia secondary to underlying inflammatory disorder. --MPN panel shows no evidence of a mutation causing his hematological abnormalities. -- Recommend to return to clinic in 3 months for labs and 6 months for clinic visit.  No orders of the defined types were placed in this encounter.   All questions were answered. The patient knows to call the clinic with any problems, questions or concerns.  A total of more than 30 minutes were spent on this encounter with face-to-face time and non-face-to-face time, including preparing to see the patient, ordering tests and/or medications, counseling the patient and coordination of care as outlined above.   Norleen IVAR Kidney, MD Department of Hematology/Oncology [facility] Phone: [phone] Pager: [phone] Email: [email or web address]  12/06/2023 4:43 PM

## 2023-12-07 DIAGNOSIS — M25551 Pain in right hip: Secondary | ICD-10-CM | POA: Diagnosis not present

## 2023-12-07 LAB — FERRITIN: Ferritin: 64 ng/mL (ref 24–336)

## 2023-12-11 ENCOUNTER — Encounter: Payer: Self-pay | Admitting: Surgery

## 2023-12-11 ENCOUNTER — Ambulatory Visit (INDEPENDENT_AMBULATORY_CARE_PROVIDER_SITE_OTHER): Payer: Medicare Other | Admitting: Surgery

## 2023-12-11 VITALS — BP 114/79 | HR 78 | Temp 98.1°F | Resp 90 | Ht 72.0 in | Wt 163.4 lb

## 2023-12-11 DIAGNOSIS — I7143 Infrarenal abdominal aortic aneurysm, without rupture: Secondary | ICD-10-CM

## 2023-12-11 DIAGNOSIS — R269 Unspecified abnormalities of gait and mobility: Secondary | ICD-10-CM | POA: Diagnosis not present

## 2023-12-11 DIAGNOSIS — M79605 Pain in left leg: Secondary | ICD-10-CM | POA: Diagnosis not present

## 2023-12-11 DIAGNOSIS — M545 Low back pain, unspecified: Secondary | ICD-10-CM | POA: Diagnosis not present

## 2023-12-11 DIAGNOSIS — R5382 Chronic fatigue, unspecified: Secondary | ICD-10-CM | POA: Diagnosis not present

## 2023-12-11 NOTE — Progress Notes (Signed)
 [facility] [place]  Patient name: [name] MRN: [id number] DOB: August 08, 1930 Sex: male   REASON FOR VISIT:    Follow up  HISOTRY OF PRESENT ILLNESS:   [name] is a 89 y.o. male who is status post endovascular aneurysm repair on 08/16/2017 for a 5.2 cm aneurysm.  In January 2021 the aneurysm had grown to 5.6 with a type II endoleak.  In August 2021 it was 5.8 cm.  Dr. Karalee performed liquid embolization on 12/11/2020 of the right L5 lumbar and median sacral arteries.  He is getting routine follow-up.  He also has a thoracic aortic aneurysm.  This past year was a rough year.  He spent time at [facility] in the hospital for aortitis.  He also had a flareup of his polymyalgia.  In addition he had a PE.  He has completed his antibiotic course.  He has been started on methotrexate.  He remains on 1-2 L of oxygen .  Overall he is improving.  He was discharged from skilled nursing facility on July 3   He continues to take a statin for hypercholesterolemia.  He has been having issues with leg pain.  He has been diagnosed with a femoral nerve issue.  He has undergone nerve block in the past 1 of which was successful, the other was not.  He comes back today for arterial evaluation   PAST MEDICAL HISTORY:   Past Medical History:  Diagnosis Date   AAA (abdominal aortic aneurysm) (HCC)    Abnormal EKG    Left atrial abnormality   Allergic rhinitis    Allergy    Benign neoplasm of colon 04/14/2010   3 small polyps on colonoscopy by Dr. [name]   Carpal tunnel syndrome    Cataract    Decubitus ulcer of coccygeal region, stage 2 (HCC) 07/06/2023   Degenerative disc disease    Disturbances metabolism of methionine, homocystine, and cystathionine (HCC)    Elevated homocysteine   Elevated prostate specific antigen (PSA)    Elevated PSA 07/06/2023   Hearing loss    Hyperlipidemia    ILD (interstitial lung disease) (HCC) 07/06/2023    Impotence of organic origin    Penile implant   Internal hemorrhoids    Leukocytosis 07/06/2023   Neck pain    Otosclerosis of both ears    Peripheral vascular disease (HCC)    Bilateral femoral bruit   Plantar fasciitis    Polymyalgia rheumatica (HCC)    Polymyalgia rheumatica (HCC) 07/06/2023   Rotator cuff syndrome of left shoulder    Scoliosis    Shoulder pain      FAMILY HISTORY:   Family History  Problem Relation Age of Onset   Heart disease Mother    Emphysema Mother    Leukemia Brother        Chronic lymphocytic leukemia   Colon cancer Neg Hx    Esophageal cancer Neg Hx    Rectal cancer Neg Hx    Stomach cancer Neg Hx     SOCIAL HISTORY:   Social History   Tobacco Use   Smoking status: Never   Smokeless tobacco: Never  Substance Use Topics   Alcohol  use: No    Alcohol /week: 0.0 standard drinks of alcohol      ALLERGIES:   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     apixaban  (ELIQUIS ) 2.5 MG TABS tablet Take 2.5 mg by mouth 2 (two) times daily.     bisacodyl 5 MG EC tablet Take 10 mg by mouth daily as needed for moderate constipation.     butalbital -acetaminophen -caffeine  (FIORICET ) 50-325-40 MG tablet Take 1 tablet by mouth daily as needed for headache. 14 tablet 0   cholecalciferol (VITAMIN D3) 25 MCG (1000 UNIT) tablet Take 1,000 Units by mouth daily.     diclofenac  Sodium (VOLTAREN ) 1 % GEL Apply 2 g topically 4 (four) times daily as needed  (shoulder pain). 2 g 0   docusate sodium  (COLACE) 100 MG capsule Take 100 mg by mouth daily.     feeding supplement (ENSURE ENLIVE / ENSURE PLUS) LIQD Take 237 mLs by mouth 3 (three) times daily between meals. 237 mL 12   finasteride  (PROSCAR ) 5 MG tablet Take 5 mg by mouth daily.     fluorometholone  (FML) 0.1 % ophthalmic suspension Place 1 drop into the right eye at bedtime.      folic acid  (FOLVITE ) 1 MG tablet Take 1 mg by mouth daily.     L-Methylfolate-B12-B6-B2 (METAFOLBIC) 04-28-49-5 MG TABS TAKE ONE TABLET TWICE DAILY (Patient not taking: Reported on 11/15/2023) 60 tablet 2   LORazepam  (ATIVAN ) 0.5 MG tablet Take 0.5 mg by mouth daily. 0.25 am, 0.25 pm,  0.5 at night     magnesium  hydroxide (MILK OF MAGNESIA) 400 MG/5ML suspension Take by mouth daily as needed for mild constipation.     melatonin 3 MG TABS tablet Take 1 tablet (3 mg total) by mouth at bedtime. (Patient taking differently: Take 5 mg by mouth as needed.)  0   methotrexate (RHEUMATREX) 2.5 MG tablet Take 2.5 mg by mouth once a week.     metoprolol  succinate (TOPROL  XL) 25 MG 24 hr tablet Take 1 tablet (25 mg total) by mouth daily. 90 tablet 3   mirtazapine  (REMERON ) 15 MG tablet Take 15 mg by mouth at bedtime. Pt taking 30 mg     Multiple Vitamins-Minerals (MULTIVITAMIN PO) Take 1 tablet by mouth daily.      ondansetron  (ZOFRAN ) 4 MG tablet Take 1 tablet (4 mg total) by mouth every 4 (four) hours as needed for nausea or vomiting. 100 tablet 6   polyethylene glycol (MIRALAX  / GLYCOLAX ) 17 g packet Take 17 g by mouth 2 (two) times daily.     predniSONE  (DELTASONE ) 20 MG tablet Take 2 tablets (40 mg total) by mouth daily with breakfast. (Patient taking differently: Take 10 mg by mouth daily with breakfast. 15mg  daily)     promethazine  (PHENERGAN ) 12.5 MG tablet Take 1 tablet (12.5 mg total) by mouth daily as needed for nausea or vomiting. 30 tablet 6   psyllium (REGULOID) 0.52 g capsule Take 0.52 g by mouth daily.      senna-docusate (SENNA-S) 8.6-50 MG tablet Take 1 tablet by mouth daily.     sertraline  (ZOLOFT ) 25 MG tablet Take 1/2 tablet daily x 7 days, then increase to one tablet. 30 tablet 2   tamsulosin  (FLOMAX ) 0.4 MG CAPS capsule Take  0.4 mg by mouth at bedtime.     Tart Cherry 1200 MG CAPS Take 1 tablet by mouth daily.     traMADol  (ULTRAM ) 50 MG tablet Take 50 mg by mouth every 4 (four) hours as needed for moderate pain or severe pain.     zolpidem  (AMBIEN ) 10 MG tablet Take 5 mg by mouth at bedtime as needed for sleep.     No current facility-administered medications for this visit.    REVIEW OF SYSTEMS:   [X]  denotes positive finding, [ ]  denotes negative finding Cardiac  Comments:  Chest pain or chest pressure:    Shortness of breath upon exertion:    Short of breath when lying flat:    Irregular heart rhythm:        Vascular    Pain in calf, thigh, or hip brought on by ambulation:    Pain in feet at night that wakes you up from your sleep:     Blood clot in your veins:    Leg swelling:         Pulmonary    Oxygen  at home:    Productive cough:     Wheezing:         Neurologic    Sudden weakness in arms or legs:     Sudden numbness in arms or legs:     Sudden onset of difficulty speaking or slurred speech:    Temporary loss of vision in one eye:     Problems with dizziness:         Gastrointestinal    Blood in stool:     Vomited blood:         Genitourinary    Burning when urinating:     Blood in urine:        Psychiatric    Major depression:         Hematologic    Bleeding problems:    Problems with blood clotting too easily:        Skin    Rashes or ulcers:        Constitutional    Fever or chills:      PHYSICAL EXAM:   There were no vitals filed for this visit.  GENERAL: The patient is a well-nourished male, in no acute distress. The vital signs are documented above. CARDIAC: There is a regular rate and rhythm.  VASCULAR: No carotid bruits PULMONARY:  Non-labored respirations ABDOMEN: Soft and non-tender  MUSCULOSKELETAL: There are no major deformities or cyanosis. NEUROLOGIC: No focal weakness or paresthesias are detected. SKIN: There are no ulcers or rashes noted. PSYCHIATRIC: The patient has a normal affect.  STUDIES:   I have reviewed the following CTA:  1. Stable 6.4 cm infrarenal abdominal aortic aneurysm status post bifurcated stent graft repair. No evidence of endoleak. 2. Stable 4.9 cm ascending thoracic aortic aneurysm. 3. Duplicated left renal arteries with FMD. 4. Prostate enlargement. 5.  Aortic Atherosclerosis (ICD10-I70.0).  MEDICAL ISSUES:   AAA: Stable aneurysm sac.  No evidence of endoleak.  Will plan for follow-up ultrasound in 1 year    Malvina New, IV, MD, FACS [facility] [phone] Pager [phone]

## 2023-12-12 ENCOUNTER — Ambulatory Visit: Payer: Medicare Other | Admitting: Internal Medicine

## 2023-12-12 ENCOUNTER — Encounter: Payer: Self-pay | Admitting: Internal Medicine

## 2023-12-12 VITALS — BP 101/62 | HR 70 | Ht 72.0 in | Wt 163.6 lb

## 2023-12-12 DIAGNOSIS — J849 Interstitial pulmonary disease, unspecified: Secondary | ICD-10-CM

## 2023-12-12 NOTE — Patient Instructions (Addendum)
 ICD-10-CM   1. Interstitial lung disease (HCC)  J84.9     2. Physical deconditioning  R53.81      Interstitial lung disease (HCC) DOE  - you have inflammation in lung since 2021/2022: This flared up in April 2024 because of the PE and likely daptomycin . Possible covid last year 2023 played a role.  HRCT aug 2024 with improvement from April 2024 but with inreased residual scar compared to baseline -> CT dec  2024 wit residual scar  - Good oxyge level at rest 1 and 12/12/2023  and walking 200 feet   Plan  - continue prednisone   but taper per Dr Valera at [facility]ology at [facility] - too high risk for lung biopsy but could consider BAL sometime in future - too high risk for anti-fibrotics  -continue night o2 as before   = but will test it on room air and see if you stil need it  - you do not need o2 at rest and with simple exertion for upto 200 feet  Pulmonary embolism April 2024; submassive with RV/LV ratio 1.14  -Currently on Eliquis  and tolerating well but having ongoing anemia that predated pulmonary embolism - noted per PCP stool occult blood positive August 2024 and on low dose eliquis  since then -ECHO Sept 2024: normal RV function and improved  - no recurrence as of 12/12/2023     Plan - contiue low dose eliquis  (Given anemia and stool occult blood positive) - atleast for 1-2 years before we check d-dimer and evalaute for stopping   Elevated ESR x 2  year per PCP Shayne Anes, MD reported 07/04/2023.  Indeterminatate QUantiferon Gold - 2024 x 2 Unclear cause.  - No headachees. - Doubt TB per oral conversation with Dr Fleeta Lim of ID Aug 2024 Persists despite improvement in lungs - DDx PMR, GCA and non-speicific  - 110 on 08/08/23 and unable to reduce prednisne below 10mg  per day.  - saw [facility]: Dr Valera 09/20/23 and started on Methotrexate 10/13/23 and this is helping you  Plan Hopefully methtorexate helps Prednisone  and methotrexate per Dr Valera  Anemia and  leukocytosis- new onset February 2024 and ongoing as of August 2024  - S/p 2 units packed red blood cells early August 2024.  Per PCP Shayne Anes, MD -stool occult blood positive late July/early August 2024. On Iron infusions as of 08/08/2023  - improved hgb to  > 12gm% In NOv 2024 - Dr [name] does not think endoscopy will be helpful in risk v benefit ratio  Plan - [facility] Dr Federico   Elevated PSA 07/03/2023 -follows with Dr Sherwood Edison On Foley since July/August 2024 - unlikely prostate cancer per DR Edison Sept 2024  Plan  - per DR Edison   Physical deconditioning Fatigue  This is multifactorial -and likely due to posthospitalization, steroid related myopathy, medical illnesses [anemia, age, pulmonary issues] and age-related sarcopenia made worse by medical illnesses.. Ongoing 08/08/2023 and significant . Slolwy improving but still not baseline as of 10/17/2023 and much improved 12/12/2023.     Plan  - continue PT - per PCP Shayne Anes, MD  Goals of care  - home palliative care following. Currently full code  Plan - ongoing per PCP Shayne Anes, MD    Followup  - face to face visit in  10  weeks or video visit ; 30 min to review

## 2023-12-12 NOTE — Progress Notes (Signed)
 Admit date:     01/24/2023  Discharge date: 01/25/2023  Principal Problem:   Fever Patient is a 89 year old male with past medical history of polymyalgia rheumatica on 5 mg of prednisone  daily, hyperlipidemia and AAA who while on vacation last June (7 months ago) contracted a viral illness causing some upper respiratory symptoms.  At that time, patient not tested for any viral illnesses including COVID.  Soon after, this viral illness caused his PMR to flareup and patient's prednisone  dosage increased from 5 mg to 25 mg (starting August 2023).  Patient's joint pain resolved sometime soon after.  However, since this viral illness, patient has had significant fatigue impacting his life. 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  Patient was seen by his rheumatologist on 2/21 and in attempt to wean down his prednisone , patient started on weekly methotrexate.  (Prednisone  not yet weaned until methotrexate felt to be efficacious.)   Patient's wife noted that he has been having fevers that had started Saturday, 2/24.  Patient started first dose of weekly methotrexate Sunday, 2/25.  (Wife and patient are very clear as to when fevers are started and when methotrexate dose was first given, the day after fever started.)  Patient saw his PCP on 2/27 and given new fevers, there were concerns that patient in his steroid-induced immunosuppressed state may have endocarditis and was referred to the emergency room for further evaluation.  In the emergency room, patient noted to have normal lactic acid and a white blood cell count of 14.5 as well as a sed rate of 78.  Brought in for further evaluation.  Fever During patient's entire hospitalization, was not febrile.  Unclear etiology if he has any infection.  Chest x-ray and urinalysis unremarkable.  White blood cell count elevated, but mildly and in the  context of patient being on high-dose of steroids continuously.  Blood cultures drawn and patient was given 1 dose of cefepime  and vancomycin .  Patient otherwise asymptomatic (other than complaints of fatigue).  Do not think patient has endocarditis.  He has no clinical signs.  Exam is unremarkable.   Additional possibility could be rheumatologic in nature?  Patient with elevated sed rate despite being on chronic high-dose steroids.  A new autoimmune inflammatory condition may cause a fever, although patient with no other symptoms.   No reason for additional antibiotics.  Will follow blood cultures and if positive will check echocardiogram.  Will plan to discharge.   Addendum: Blood cultures final result negative   Fatigue Had extensive discussion with patient and his wife about his fatigue.  I have a strong suspicion that he may have contracted COVID last summer and since then, has been suffering the effects of long COVID which have caused his fatigue.  (If this is indeed long COVID, discussed with patient and wife about new trials looking at treating long COVID successfully with SSRIs.  This may be something to consider as per patient's PCP.)   See below in regards to prednisone  and methotrexate.   For completeness sake, ordered CMV, Epstein-Barr, [place] spotted fever and Lyme disease titers to look for other causes of fatigue.  (Addendum: Following discharge, these titers all came back negative.)   Polymyalgia rheumatica (HCC) Patient has been on 5 mg of prednisone  for his polymyalgia rheumatica for some time.  The viral illness that he contracted last summer triggered  a flareup causing joint pain.  This was treated with increasing prednisone  dose to 25 mg daily.   There appears to be some confusion in the efficacy of this treatment.  Patient's joint pain did indeed respond to elevated prednisone  dose, however attempts to reduce prednisone  dose led to worsening of fatigue.  Had extensive  discussion with patient and wife that the fatigue and joint pain are likely unrelated.  Patient probably felt worse with prednisone  decreased as prednisone  can often cause a feeling of increased energy and wakefulness.   There may have been confusion between patient and providers that patient unable to wean off prednisone  due to failure of therapy.  Therefore, discontinuing methotrexate.  Weaning down prednisone  very slowly over the next month to get patient back to 5 mg p.o. daily.  Fatigue appears to be independent and as above recommended treatment.  INPATIENT PULM COJSULT 04/06/23   Acute Hypoxic Respiratory Failure in setting possibly of multifocal pna vs eosinophilic pna versus organizing pneumonia Acute PE  PMR on chronic prednisone , immunocompromised  Patient remains on high flow nasal cannula oxygen , unable to titrated down currently Continue IV antibiotics, currently on vancomycin  and cefepime , infectious disease following Started on high-dose steroid with Solu-Medrol  125 mg every 6 hours with suspected inflammatory lung disease Follow-up autoimmune panel Will try to get sputum culture and sputum pneumocystis PCR Ideally patient needs bronchoscopy with BAL and transbronchial biopsy but due to high oxygen  requirement, will currently hold off Continue subcu Lovenox  therapeutic dose Monitor intake and output  Xxxx Renal visit [facility] 02/07/23  Pt reports he was diagnosed with PMR 30 years ago with large proximal aches. He was started on prednisone  and felt immediately better. For the next 30 years he took 5 mg a day of prednisone  and did very well. 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 In June of last year he developed a virus which consisted of profound fatigue and malaise. This caused his PMR to explode. He had loss of energy and his sed rate went up. He no longer wanted to exercise. His ESR went up to 101 and his prednisone  was increased to 30 mg daily. He felt somewhat better on  this higher dose but not back to normal.   Somewhere in this interim he developed a femoral nerve neuropathy that resolved with aggressive PT.   In August he developed covid with a 102 fever. He did not require hospitalization.   On 2/21 he followed up with rheumatology who recommended tapering the prednisone . He was cut down to 25 mg. He was started on weekly methotrexate but only took one dose.   He was admitted to [facility] at the end of February due to low BP (100 systolic; normal for him is mildly hypertensive), chills, and temp to 101-102. . In the emergency room, patient noted to have normal lactic acid and a white blood cell count of 14.5 as well as a sed rate of 78. During patient's entire hospitalization, was not febrile. Unclear etiology if he has any infection. Chest x-ray and urinalysis unremarkable. White blood cell count elevated, but mildly and in the context of patient being on high-dose of steroids continuously. Blood cultures drawn and patient was given 1 dose of cefepime  and vancomycin . Patient otherwise asymptomatic (other than complaints of fatigue). They stopped the methotrexate at the ER.   Since the hospitalization he's been even more fatigued. Denies red eyes, ear pain, severe sinus symptoms, hemoptysis, SOB, chest pain, significant GI symptoms, difficulty urinating,  hematuria, leg swelling, skin rashes, fevers.  Eating/drinking fine. No typical PMR muscle pains, no joint pains. He does endorse night sweats requiring him to change his pajamas.   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 He remains very active with [employer]. He has been a lifelong athlete  Xxxxxxxxxxxxxxxxxxxxxxxxxxxxxxxxxx  [facility] RHEUM VISIT [phone]  Leland Raver is a 89 y.o. male with a history of PMR who presents for evaluation of fatigue, fevers, and hematuria noted to have a dilated thoracic aorta and abdominal aortic aneurysm, treated in past with endovascular repair. DDx for his presentation includes  possible large vessel vasculitis, infection of the aorta. We talked about the possibility of GCA today, but he does not have any cranial symptoms. GCA would also not explain the red cell casts in his urine today. He may have ANCA vasculitis, which would potentially tie together his lung nodules and urine findings. He is seeing Dr. Robinette in ID today to rule out infectious causes given his recent fevers so out visit was abbreviated.  - check ANCA panel today - consider PET scan - taper prednisone  - ID appointment today - Discussed with Dr. Jarome potential admission to hospital to expedite workup   xxxxxxxxxxxxxxxxxxxxxxxxxxxxxxxxxxxxxxxx[id number] Admit date: 03/05/2023  -Discharge date: 03/23/2023  LYRIK BURESH is a 89 y.o. male with medical history significant for polymyalgia rheumatica on chronic prednisone  therapy, AAA status post endovascular stent graft in September 2018, chronic diastolic heart failure, recent diagnosis of aortitis, chronic leukocytosis, who is admitted to [facility] on 03/05/2023 with acute pulmonary embolism after presenting from home to [facility] ED complaining of generalized weakness.    [facility] for generalized weakness.  Subsequently, he was admitted to [facility] with similar initial complaint, before being discharged home on 02/13/2023.  During hospitalization at [facility], there was reportedly concern for potential aortitis, for which the patient was started on long-term current spectrum of antibiotics via PICC line, including daptomycin , Rocephin , and Bactrim .  He notes good interval compliance with these antibiotics.   However, over the last 1 to 2 days, he is noted progressive generalized weakness in the absence of any acute focal weakness.  He qualifies his generalized weakness is being significant enough to prevent him from getting out of bed over the last day, which is quite different  than his baseline functionality and activity level.  Denies any associated acute focal weakness.    He has a history of polymyalgia rheumatica and has been on chronic steroid therapy for multiple decades.  Recently, there have been outpatient attempts at reducing his dose of chronic prednisone , and he notes that the most recent dose adjustment down to 15 mg of daily prednisone  occurred a few days prior to the onset of his presenting generalized weakness.   He denies any recent subjective fever, chills, rigors, or generalized myalgias.  Denies any new headache, neck stiffness, rash, cough, hemoptysis, shortness of breath, abdominal pain, diarrhea, dysuria or gross hematuria.  He also denies any recent chest pain, palpitations, diaphoresis, nausea, vomiting, dizziness, presyncope, or syncope.  No recent worsening of peripheral edema, or any recent/new lower extremity erythema or calf tenderness.  Denies any known history of prior pulmonary embolism.   Aside from the 2 hospitalizations over the last 6 weeks, denies any additional periods of prolonged diminished ambulatory activity as of recent.  No recent trauma.  No known history of underlying malignancy.  Not on any  blood thinners as an outpatient, including no aspirin .   Per chart review, it appears that he also has chronic leukocytosis with chart review revealing most recent prior white blood cell count of 18.8 on 02/28/2023, with other blood cell data points notable for 25.4 on 02/07/2023.   Denies any known baseline supplemental oxygen  requirements.   ESR 105 compared to most recent prior value of 69 on 01/25/2023, CRP 12.8 compared to 75 on 02/28/2023. CBC notable for the following: Open cell count 20,000 with 89% neutroph     CTA chest, relative to CT chest from 12/02/2022, per formal radiology read, shows acute pulmonary embolism at the distal right main pulmonary artery extending into the right middle and lower pulmonary arteries, with CT evidence of  right heart strain.  Additionally, CTA chest shows pulmonary parenchymal findings suggestive of multifocal pneumonia superimposed on a background of interstitial lung disease as well as small bilateral pleural effusions.  CTA chest also shows unchanged aneurysmal dilation of the ascending, transverse, and descending thoracic aorta, without any corresponding evidence of dissection.   EDP discussed the patient's case with the on-call infectious disease physician, Dr. Fleeta dam, he felt that acute pneumonia was slightly less likely given the patient's broad-spectrum IV antibiotics over the last several weeks, including daptomycin , Rocephin , and Bactrim .  He did not recommend changing daptomycin  to IV vancomycin , but rather recommended continuation of daptomycin .  He did however recommend consideration for escalation of Rocephin  to cefepime , with no additional changes recommended per infectious disease at this tim   Latest Reference Range & Units 03/06/23 21:40  ANA Ab, IFA  Negative  Anti JO-1 0.0 - 0.9 AI <0.2  CCP Antibodies IgG/IgA 0 - 19 units 2  RA Latex Turbid. <14.0 IU/mL 26.0 (H)  Cytoplasmic (C-ANCA) Neg:<1:20 titer <1:20  P-ANCA Neg:<1:20 titer <1:20  Atypical P-ANCA titer Neg:<1:20 titer <1:20  Anti-MPO Antibodies 0.0 - 0.9 units <0.2  Anti-PR3 Antibodies 0.0 - 0.9 units <0.2  C3 Complement 82 - 167 mg/dL 836  Complement C4, Body Fluid 12 - 38 mg/dL 28  SSA (Ro) (ENA) Antibody, IgG 0.0 - 0.9 AI <0.2  SSB (La) (ENA) Antibody, IgG 0.0 - 0.9 AI <0.2  Scleroderma (Scl-70) (ENA) Antibody, IgG 0.0 - 0.9 AI 0.4  (H): Data is abnormally high  Hospital Course:  #1 acute hypoxic respiratory failure in the setting of multifocal pneumonia versus eosinophilic pneumonia from daptomycin  versus interstitial lung disease/autoimmune process/acute PE with right lower extremity DVT in the setting of PMR on chronic steroids/immunocompromise state -Patient was seen in consultation by ID, daptomycin   discontinued on 03/05/2023 due to concerns for possible eosinophilic pneumonia. -Patient with improvement currently on 1 L nasal cannula with sats of 97% at rest. -Patient noted to have been placed on high dose steroids which are being tapered down patient currently on prednisone  40 mg daily recommended to continue for the next 3 to 4 weeks at this dose until seen by PCCM in the outpatient setting. -ID recommended continuation of IV Rocephin , oral doxycycline , Bactrim  for PJP prophylaxis. -Patient completed course of antibiotics 03/23/2023. -ID recommending continuation of Bactrim  for pneumocystis prophylaxis while on high-dose steroids. -Will need outpatient follow-up with ID at [facility] postdischarge.   2.  Paroxysmal atrial fibrillation/atrial flutter -Seen in consultation by cardiology and felt likely secondary to acute pulmonary issues. -Noted to have converted on IV amiodarone  which was continued for 24 hours and subsequently discontinued. -Patient subsequently maintained on low-dose Lopressor  for rate control, Eliquis  for anticoagulation.  3.  PE/right lower extremity DVT -Noted on CT angiogram chest. -Patient noted to have elevated troponins were likely secondary to demand ischemia. -Lower extremity Dopplers done positive for right lower extremity DVT. -2D echo done with abnormal septal motion, EF 55 to 60%,NWMA, no evidence of cor pulmonale in the setting of recent PE. -Patient initially placed on IV heparin  and subsequently transition to Eliquis .   -Patient improved clinically.   -Patient will be discharged on Eliquis .   -Outpatient follow-up with pulmonary and PCP.    4.  AAA status post graft and possible aortitis -Patient noted to have been on daptomycin  per ID at [facility] which was discontinued during this hospitalization due to concerns for eosinophilic pneumonia. -Patient received a full course of IV Rocephin  and IV doxycycline  which was completed during the hospitalization on day of  discharge.  -Patient also maintained on Bactrim  for PJP prophylaxis per ID recommendations while on steroids.  Patient will be discharged on Bactrim  for PJP prophylaxis. -Outpatient follow-up with ID at [facility].   5.  Acute delirium/anxiety -Possibly secondary to hospital delirium versus from hypoxia versus steroid-induced. -Improved clinically.      6.  PMR -Patient noted to chronically be on 5 mg of prednisone  at home was on high-dose steroids and tapered down to 40 mg of prednisone  daily secondary to problem #1 by day of discharge.  Patient be discharged on prednisone  40 mg daily until follow-up with pulmonary.     8.  Anemia of chronic disease -H&H stable. -No overt bleeding.   9. chronic leukocytosis -Patient noted to have a chronic leukocytosis could be secondary to steroid induced. -Labs with some improvement with leukocytosis by day of discharge.   -Patient completed course of antibiotics during the hospitalization, remained afebrile.   -Outpatient follow up with hematology for follow-up on leukocytosis.   -Ambulatory referral placed. -Patient will be discharged to a skilled nursing facility.   10.  GERD -Patient maintained on PPI, Pepcid , Zofran .         13.  Pressure injury, not present on admission Pressure Injury 03/12/23 Sacrum Mid Stage 1 -  Intact skin with non-blanchable redness of a localized area usually over a bony prominence. Pink non blanching on sacrum (Active)    Previous [facility] pulmonary encounter:  05/01/2023- Dr. Kara     Chief Complaint  Patient presents with   Hospitalization Follow-up      HFU. Still using 4L of O2.     Judea Riches is a 89 year old male with history of polymyalgia rheumatica on chronic prednisone  and recent hospitalization at [facility] where he was diagnosed with aortitis and started on daptomycin  and rocephin  via PICC line on 3/14. He presented to [facility] on 4/7 with generalized weakness and shortness of breath. CTA Chest on  admission showed pulmonary embolism and bilateral patchy ground-glass and consolidative opacities with interlobular septal thickening superimposed on back ground subpleural reticulation and bronchiolectasis. Echocardiogram did not show RV strain. He was started on heparin  drip. He required transfer to the ICU for acute hypoxemic respiratory failure with heated high flow oxygen  requirements. He was started on pulse dose steroids and broad spectrum antibiotics. Bronchoscopy was not performed due to his significant oxygen  requirements. He was transferred out of the ICU on 4/18 with decreasing O2 requirements. He was discharged on 40mg  of prednisone  daily and bactrim  prophylaxis. He was started on nystatinc for thrush. He was discharged on 1-2L of oxygen  and went to rehab.    He started  having bowel trouble at [facility] for about a month with constipation. He has been started on aggressive bowel regimen and has required manual disimpaction per his wife. He had large explosive bowel movement in clinic today.    His prednisone  was reduced to 20mg  daily at rehab due to concerns of intolerance of high dose therapy. He continues bactrim  DS 1 tab 3 days per week.    He is working on walking with a walker, he walked 32 steps yesterday without assistance.    He has reduced appetite and is trying to drink protein supplements daily.     06/14/2023 - Interim hx Patient presents today for 1 month follow-up/acute office visit.  He was seen by Dr. Kara on 05/01/2023 hospital follow-up due to respiratory failure in the setting of interstitial lung disease.  Patient has a history of Polymyalgia rheumatica on chronic prednisone .  Differential for respiratory failure includes eosinophilic pneumonia in the setting of daptomycin  use versus progressive ILD process.  Patient has mild basilar reticulation on CT chest from March 2022.  Inflammatory workup has been negative.  Follow-up chest x-ray on 05/01/2023 showed patchy  bilateral interstitial and alveolar opacity bilaterally not significantly changed compared to prior chest x-ray from April 2024.    Accompanied by his wife and nursing aid today Breathing is about the same  He is on supplemental oxygen , for the most part he is using 1L oxygen  at home  He is on 20mg  prednisone  daily with Bactrim  double strength 1 tablet 3 days/week for pneumocystitis prophylaxis.  Need to discuss prednisone  taper if able    Patient had UTI symptoms (frequency/urgency, fever and confusion) on July 6. He had some air hunger at this time. No associated URI symptoms or cough. Prescribed abx Augmentin  x 10 days and doxycycline  x10 days. CXR done with Dr. Shayne, results are not accessible in Epic. He is doing better. He is more tired last several days. He is having some stomach/abdomen discomfort. He does struggle with constipation.  He had a small bowel movement today.  He has been compliant with stool softener.  Plan  - reduce prednisone  to 15mg  per day  OV 07/04/2023 -transfer of care to Dr. Geronimo in the ILD center.  Subjective:  Patient ID: [name] Scurry, male , DOB: 1930-05-11 , age 22 y.o. , MRN: [id number] , ADDRESS: [address] [place] [id number] PCP Shayne Anes, MD Patient Care Team: Shayne Anes, MD as PCP - General (Internal Medicine) Kate Lonni CROME, MD as PCP - Cardiology (Cardiology) Caresse Cough, MD as Referring Physician (Ophthalmology) Gerlean Lynwood BIRCH, MD (Inactive) as Consulting Physician (Vascular Surgery) [name] Norleen SAILOR, MD as Consulting Physician (Gastroenterology)  This Provider for this visit: Treatment Team:  Attending Provider: Geronimo Amel, MD    07/04/2023 -   Chief Complaint  Patient presents with   Hospitalization Follow-up    New pt. And advise on health     HPI [name] 89 y.o. 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  Here with his wife Devere and also caretaker Rock.  History is provided by the  wife, caretaker, a little bit also from the patient also review of the external medical record.  All this was done on 07/03/2023.  Subsequent history also taken on 07/04/2023 the following day from [place].  Neither primary care physician.  The very complex story.  It appears that even at baseline he has had some mild atelectasis changes in his lower lung fields but this was essentially asymptomatic  and he was caring about his activities of daily living.   Has chronic history of polymyalgia rheumatica and has been on low-dose steroids for a few decades.  He has had persistently elevated ESR at least between 70-100 for the last 2 years according to PCP.  At some point because of possible PMR flare he was subjected increased prednisone  dose but he always had this fatigue.   Review of the records indicate that starting February 2024 he started having leukocytosis and new onset anemia.  He did have an admission in the hospital at the time for fever.  Subsequently had extensive workup in the spring 2024 at [facility].  He had an admission for this.  They have an considered whether he might have aortitis.  This was in mid March 2024.  He was then treated with daptomycin  in the hospital.  He was discharged in early April 2024 with a PICC line but shortly after that at home he collapsed.  Was diagnosed with submassive PE and interstitial pneumonitis.  He had no evidence of RV strain on the echo but his RV-LV ratio was 1.14.  Is on high flow oxygen .  Was given high-dose steroids.  During this time had ICU delirium including according to both primary care physician and D Vandam the infectious disease physician that I spoke to neuro suicidal ideations.  He was then discharged to [facility] where he spent 9 weeks and finally got home on 05/31/2023.  He is currently undergoing intense physical therapy at home.  During all this his high flow oxygen  needs have improved he is such that he is only on 1-2 L nasal  cannula at rest.  The caretaker states that sometimes even at rest he is got normal pulse ox and he can go several hours without using oxygen .  He is able to get around with a walker but when he walks a certain distance such as 30 to 40 feet he will get extremely tired.   Overall the wife initially said that he was not any better but they did admit that he is now more conditioned.  He is also having reduced oxygen  needs but the fatigue is the main component.  In talking to primary care physician as well fatigue seems to be the biggest component.  In fact a few days ago primary care physician called him in and had his hemoglobin checked it was 7 g%.  He just finished 2 units of transfusion.  He is somewhat better but still feeling extremely fatigued.  His leukocytosis continues and wife is frustrated about this and feels this might be associated with the fatigue.  In terms of his PMR and ILD his current prednisone  treatment is at 15 mg/day.  This was slowly tapered since his hospitalization in April 2024.  Dr. Devault had instructed him to take 15 mg/day.  This was a clinical taper.  Despite different doses of prednisone  his ESR is always continue to be high and the reason for this is not known.  In terms of pulmonary embolism he continues his Eliquis  low dose His last echocardiogram was in April 2024.  His EF is 55 to 60% and had no evidence of RV strain.   OV 07/10/2023  Subjective:  Patient ID: [name] Scurry, male , DOB: 28-Mar-1930 , age 39 y.o. , MRN: [id number] , ADDRESS: [address] [place] [address] PCP Shayne Anes, MD Patient Care Team: Shayne Anes, MD as PCP - General (Internal Medicine)  Kate Lonni CROME, MD as PCP - Cardiology (Cardiology) Caresse Cough, MD as Referring Physician (Ophthalmology) Gerlean Lynwood BIRCH, MD (Inactive) as Consulting Physician (Vascular Surgery) [name] Norleen SAILOR, MD as Consulting Physician (Gastroenterology)  This Provider for this  visit: Treatment Team:  Attending Provider: Geronimo Amel, MD  Type of visit: Video Virtual Visit Identification of patient [name] with 12-03-1929 and MRN [id number] - 2 person identifier Risks: Risks, benefits, limitations of telephone visit explained. Patient understood and verbalized agreement to proceed Anyone else on call: His wife.  Also the caretaker.  He himself could not join Patient location: His home This provider location: [address]; [place]; [place] [address]. [facility]. [phone]    07/10/2023 -follow-up respiratory failure.   HPI [name] 89 y.o. - In this video visit the purpose is to discuss CT scan results and then to catch up.  I personally visualized the CT scan over time and also showed it to the wife and the caretaker.  He has had some basal chronic ILD changes even 2022 in 2021.  It was extremely mild and could have been passed off as atelectasis.  He also had this in early 2024.  However clearly in April 2024 he had significant groundglass opacities and alveolar airspace filling.  Dense consolidation.  This could easily have been [place] with a high ESR but then his ESR is been chronically elevated for over 2 years according to PCP.  He has responded to steroids.  His lungs have improved and left with residual scar although the level of residual scar seems worse than his baseline.  There is currently very little any groundglass opacities currently on 15 mg prednisone  since mid July 2024.  He is stable on 1 L nasal cannula occasionally 2 L according to caretaker.  We took a shared decision making to reduce to 10 mg/day [his baseline was 5 mg to-10 mg/day for his PMR].  Did indicate to the wife there is a small chance that things could flareup but overall this chronic toxicity to deal with with prednisone  and therefore we took a shared decision making to reduce.  Regarding his other issues such as fatigue, anemia, other  reasons for elevated ESR he is seeing hematology.  I have also spoken infectious disease doctor.  If he has a bone marrow biopsy they will do cultures.  His QuantiFERON gold was indeterminate.  I spoke to Dr. Lindia he does not think patient has TB.  I agree with that.  His anemia also appears to have improved.    OV 08/08/2023  Subjective:  Patient ID: [name] Scurry, male , DOB: 02-Aug-1930 , age 54 y.o. , MRN: [id number] , ADDRESS: [id number] [place] [address] PCP Shayne Anes, MD Patient Care Team: Shayne Anes, MD as PCP - General (Internal Medicine) Kate Lonni CROME, MD as PCP - Cardiology (Cardiology) Caresse Cough, MD as Referring Physician (Ophthalmology) Gerlean Lynwood BIRCH, MD (Inactive) as Consulting Physician (Vascular Surgery) [name] Norleen SAILOR, MD as Consulting Physician (Gastroenterology)  This Provider for this visit: Treatment Team:  Attending Provider: Geronimo Amel, MD    08/08/2023 -   Chief Complaint  Patient presents with   Follow-up    F/up on ILD     HPI KORAY SOTER 89 y.o. -last visit was in mid August 2024.  In the interim I did run into his son and wife outside the hospital at a social event.  They reported that  fatigue continues although he is trying to ambulate and continue with physical therapy.  They have held off on the bone marrow biopsy because of concerns of pain.  The current focus is sto see progress iron infusions.  His most recent labs in our system is from 07/05/2023.  He continues to have normal renal function but he continues to be anemic with a hemoglobin 9.4 g% and is iron deficient.  His thrombocytosis continues.  And his ESR is elevated greater than 100.  He did visit Dr. Lonni Mas in cardiology on 07/19/2023 for his SVT was noticed to be doing stable on Toprol .  He was continuing his LOW DOSE Eliquis .  Given his anemia Dr. Norleen Kiang gastroenterologist on 07/24/2023 felt colonoscopy was putting him at high  risk.  Then on 08/03/2023 with complaints of abdominal pain.  Has had CT scan showed significant stool and consistent with constipation.  Today he presents with his caretaker Rock, wife and his son.  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  -Most recent visit 07/06/2023 with Dr. Lindia infectious diseases.  Infection considered low possibility but if bone marrow is ever done for his anemia recommended AFB cultures.  ll#Interstitial lung disease/dyspnea on exertion  -Had flareup in April 2022 associate with PE and also daptomycin .  [Daptomycin  given for suspected aortitis at [facility]]  -He remains on oxygen  2 L nasal cannula.  But many times he has been observed to be saturating well when he is off oxygen .  The caretaker states that when she sometimes gives him a shower and helps him with that he is not desaturating when he is not on oxygen ..  In fact today after we turned his oxygen  off he was able to do a sit/stand test without any desaturations.  He is using oxygen  at night but this has not helped his fatigue.  On his CT abdomen lung image August 03, 2023 the ILD is mild.  He is currently on 10 mg prednisone  which is a lower dose compared to his last visit.  They are willing to get a taper down to 5 mg per my advice.  #History of pulm embolism April 2024 for submassive PE -     -He is on low-dose Eliquis  noW.  I ordered an echo last time but the CMA indicated that this order was not placed.  Therefore have reordered this.  Will also check a D-dimer.  If there is no evidence of pulmonary hypertension this reduces risk for any anesthetic complications from any potential procedures.  #Iron deficiency anemia with heme positive stools.  Status post PRBC on 06/27/2023-Associated thrombocytosis and leukocytosis.    - Initial heme-onc consult 07/05/2023   There is shared decision making taken to continue with iron but through  a infusion form and see if he improves.  Last hemoglobin check was on that day 9.4 g%.  According to the family the iron infusions have not helped.  They deferred a bone marrow biopsy because of concerns of pain.  Although today they indicated that patient's 59 year old brother in [place] has had bone marrow biopsy recently and was able to tolerate it well.  -Most recent infusion was on 07/21/2023.   -CT abdomen September 2024 just showed a lot of stool and constipation   -Dr. Kiang has indicated colonoscopy might be of significant risk although today has been normoxic on room air and was able to do sit and stand  test.-> discussed with him 08/08/2023 - feels supportive care best. He has been in touch with them  #Elevated ESR greater since February 2024 and [facility] records (according to primary care physician present for couple of years]   - No clear etiology known.  He in the past he has not responded to increase prednisone  for polymyalgia rheumatica.  We will recheck this again today.  #History of SVT approximately atrial fibrillation April 2024 follows with Dr. Barnetta  -Most recent visit was on 07/19/2023.  Noted to be on Toprol   #Elevated PSA 07/03/2023  - The [facility].  For the last 2 months he has now had a Foley catheter because of enlarged prostate they are not aware of a prostate cancer diagnosis.  I did reach out to Dr. Edison and have asked him to call me back but he has a chronic Foley for the last 2 months now.  No issues currently on this.   -Did discuss with Dr. Edison.  He does not think prostate cancer is a possibility.  Moreover with the age and no evidence of metastasis it is a nonissue.  He plans for IR embolization of the prostate to shrink it and have the Foley catheter come out.  #Physical deconditioning and fatigue   -His functional strength is improved he is able to transfer better.  He is able to sit stand.  He is  able to walk in the hallway or in the driveway with his walker.  However fatigue is significant.  The son took him to [employer] course and had him sit in the golf cart.  They watched for golf teams come by and after the patient got exhausted and had to go home despite eating a hot dog.   #g symptoms burden  -All significant and see below.  -I do not think the ILD is contributing to the symptom on the other hand is multifactorial and from anemia and high ESR and frailty and cachexia.  -His appetite is diminished but he is able to eat and not gaining weight though.  #Decub ulcer  -Being followed by wound care   #Goals of care  -Remains full code.  They have hesitated on undergoing procedures because of risk and bone marrow biopsy because of pain.   I did indicate to them that sometimes risk might have to be taken as long it is not undue to undergo certain procedures in order to establish clarity in the prognosis and diagnosis.  If reversible etiologies are found then he can improve but if your reversible etiologies are found then at least we have clarity.  They are processing this information. Ultimtely risk v beneift to be dcided by them/patoient an dproceduralists in shared decisin maknig         OV 08/29/2023  Subjective:  Patient ID: [name] Scurry, male , DOB: [birth year] , age 71 y.o. , MRN: [id number] , ADDRESS: [address] [place] [address] PCP Shayne Anes, MD Patient Care Team: Shayne Anes, MD as PCP - General (Internal Medicine) Kate Lonni CROME, MD as PCP - Cardiology (Cardiology) Caresse Cough, MD as Referring Physician (Ophthalmology) Gerlean Lynwood BIRCH, MD (Inactive) as Consulting Physician (Vascular Surgery) [name] Norleen SAILOR, MD as Consulting Physician (Gastroenterology)  This Provider for this visit: Treatment Team:  Attending Provider: Geronimo Amel, MD    08/29/2023 -   Chief Complaint  Patient presents with   Follow-up  Echo f/u and labs      HPI [name] 89 y.o. -Prsents with caretaker Rock and wife Devere. No hospitalizations since last visit. 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  [Daptomycin  given for suspected aortitis at [facility] On 08/08/23: wsa able to sit/stand x5 on room air withou desaturations. So, we reduced his prednisone  to 5mg  per day but he got too fatigued. Per wife and Rock even at 10mg  per day he ws fatigued. So he  is on 15mg  per day and they prefer to stay there. He is wearing 1L [place] now but RA pulse oxx 97%   #History of pulm embolism April 2024 for submassive PE -     -He is on low-dose Eliquis  noW. D-dimer in July 2024: is 1.6 and Sept 2024 was 1.85. ECHO 08/28/23 shows Normal RV And is very reasuring. EF 50-55%. Aortic valve calcification +. Mild - mod stenosis . OPVerall reassuring  #Iron deficiency anemia with heme positive stools.  Status post PRBC on 06/27/2023 for a hemoglobin of 8.1 g%- #Associated thrombocytosis and leukocytosis.  Initial heme-onc consult 07/05/2023   -Did see Dr. Federico and his physician assistant Johnston on 07/05/2023.  Instead of bone marrow biopsy decided to pursue iron infusions.  Last hemoglobin check showed improvement in hemoglobin to 10.4 g% on 08/08/2023 and is significantly improved.   -Most recent infusion was on 07/21/2023.   -CT abdomen August 03, 2023 just showed a lot of stool and constipation   -Dr. [name] has indicated colonoscopy might be of significant risk .  Most recent follow-up with Dr. [name] was 08/11/2023.  Continued conservative management has been recommended.  He  was reassured by the CT scan on August 03, 2023.  #Elevated ESR greater since February 2024 and [facility] medical records (according to primary care physician present for couple of years]    - No clear etiology known.  He in the past he has not responded to increase prednisone  for polymyalgia rheumatica.  I recheck this again on 08/08/2023 and is again greater than 110.  Dr. Lindia does not think this from tuberculosis.  Discussed this with the patient and the family.  I will make a referral to [facility] rheumatology.  I also discussed Dr. Lindia who supports patient being seen by rheumatologist for this.  #History of SVT approximately atrial fibrillation April 2024 follows with Dr. Barnetta   -Most recent visit was on 07/19/2023.  Noted to be on Toprol   #Elevated PSA 07/03/2023 with BPH   - The been following with [facility] in [facility].  Since approximately June 2024 h per Dr. Edison in September 2024 prostate cancer is considered unlikely.  His plan was for interventional radiology embolization of the prostate to make it shrink it and then remove the Foley.  According to the family on 08/29/2019 for the not sure what the status of this is.  With BPH   #Physical deconditioning and fatigue   -His functional strength is improved he is able to transfer better.  He is able to sit stand.  He is  able to walk in the hallway or in the driveway with his walker.  However fatigue is significant.  The family did concede this is somewhat better.  Ed[place] symptom assessment score is improved [see below].  #GI symptoms -Low appetite continues but is somewhat better - Nausea this continues to be a problem.  There is some better perhaps.  The amount of Zofran  the using is less.  But is still a significant problem.  Wife thinks is associated with acid reflux.   #Decub ulcer  -Being followed by wound care   #Goals of care  -Remains full code.    OV 10/17/2023  Subjective:  Patient ID:  [name] Scurry, male , DOB: 1930/11/24 , age 4 y.o. , MRN: [id number] , ADDRESS: [address] [place] [address] PCP Shayne Anes, MD Patient Care Team: Shayne Anes, MD as PCP - General (Internal Medicine) Kate Lonni CROME, MD as PCP - Cardiology (Cardiology) Caresse Cough, MD as Referring Physician (Ophthalmology) Gerlean Lynwood BIRCH, MD (Inactive) as Consulting Physician (Vascular Surgery) [name] Norleen SAILOR, MD as Consulting Physician (Gastroenterology)  This Provider for this visit: Treatment Team:  Attending Provider: Geronimo Amel, MD    10/17/2023 -   Chief Complaint  Patient presents with   Follow-up    F/u after [facility] visit.   Mae Bontempo presents for follow-up with his wife Devere and caretaker.  No hospitalizations.  They have completed the [facility] visit for his high ESR.  He saw rheumatology.  He is now on methotrexate and is taken 1 weekly dose.  Details otherwise below.  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  [Daptomycin  given for suspected aortitis at [facility] On 08/08/23: wsa able to sit/stand x5 on room air withou desaturations. So, we reduced his prednisone  to 5mg  per day but he got too fatigued. Per wife and Rock even at 10mg  per day he ws fatigued.  Currently they went to the restaurant and when his oxygen  came off used a little bit labored.  Today we turned the oxygen  off and the finger pulse ox did not pick up oxygen  levels but with this for probe he was 100% on room air at rest.    #History of pulm embolism April 2024 for submassive PE -     -He is on low-dose Eliquis  noW. D-dimer in July 2024: is  1.6 and Sept 2024 was 1.85. ECHO 08/28/23 shows Normal RV And is very reasuring. EF 50-55%. Aortic valve calcification +. Mild - mod stenosis . OPVerall reassuring.  No bleeding issues.  #Iron deficiency anemia with heme positive stools.  Status post PRBC on 06/27/2023 for a hemoglobin of 8.1 g%- #Associated thrombocytosis and leukocytosis.  Initial heme-onc consult 07/05/2023   -Did see Dr. Federico and his physician assistant Johnston on 07/05/2023.  Instead of bone marrow biopsy decided to pursue iron infusions.  Last hemoglobin check showed improvement in hemoglobin to 10.4 g% on 08/08/2023 and is significantly improved.   -Most recent infusion was on 07/21/2023.   -CT abdomen August 03, 2023 just showed a lot of stool and constipation   -Dr. [name] has indicated colonoscopy might  be of significant risk .  Most recent follow-up with Dr. [name] was 08/11/2023.  Continued conservative management has been recommended.  He was reassured by the CT scan on August 03, 2023.   -As of 10/18/2023 not getting blood transfusion iron infusions.  Hemoglobin 10/11/2019 was improved to 12.6 g%.  His color is better.   #Elevated ESR greater since February 2024 and [facility] medical records (according to primary care physician present for couple of years]    - No clear etiology known.  He in the past he has not responded to increase prednisone  for polymyalgia rheumatica.  I recheck this again on 08/08/2023 and is again greater than 110.  Dr. Lindia does not think this from tuberculosis.  At last visit referred to rheumatology.  He is did see Dr. Valera at [facility] rheumatology on 09/20/2023.  External records reviewed.  Diagnosis of PMR is on the chart but vasculitis/rheumatoid arthritis is under the differential.  ESR was not checked at [facility] but CRP was elevated.  Rheumatoid factor was trace positive.  He has been started on methotrexate.  I did indicate to them that methotrexate onset of action could be anywhere from 4-12  weeks.  The ultimate goal is to slowly taper the prednisone .  [facility] is handling this.   #History of SVT approximately atrial fibrillation April 2024 follows with Dr. Barnetta   -Most recent visit was on 07/19/2023.  Noted to be on Toprol   #Elevated PSA 07/03/2023 with BPH   - The been following with [facility].  Since approximately June 2024 h per Dr. Edison in September 2024 prostate cancer is considered unlikely.  His plan was for interventional radiology embolization of the prostate to make it shrink it and then remove the Foley.  According to the family on this visit 10/18/2023 he still has his Foley catheter.  They are not sure about the status of interventions here.   #Physical deconditioning and fatigue   -This is his biggest complaint of fatigue.  On this visit 10/18/2023 the family agreed that he is actually getting better.  He is now able to walk some distances without any help.  He is undergoing intense physical therapy exercises with [facility].  Today the patient felt little bit more fatigued but overall they do say that he is better.  His weight transfer and his physicality overall is better.  Still he was sitting in the wheelchair today.  #GI symptoms -Low appetite continues but is somewhat better - Nausea this continues to be a problem as of September 2024.SABRA  There is some better perhaps.  The amount of Zofran  the using is less.  But is still a significant problem.  Wife thinks is associated with acid reflux.  On November 2024 ongoing and stable.   #Decub ulcer  -Being followed by wound care.  This was present in September 2024 but healed as of November 2020 for this visit.   #Goals of care  -Remains full code.  #Overall [place] symptom score appears to be stable/improving.     CT Chest data from date: SPET 2024  - personally visualized and independently interpreted :YES - my findings are: MILD ild AT BASE    OV  12/12/2023  Subjective:  Patient ID: [name] Scurry, male , DOB: 12-09-1929 , age 80 y.o. , MRN: [id number] , ADDRESS: [address] [place] [address] PCP Shayne Anes, MD Patient Care Team: Shayne Anes, MD as PCP - General (Internal Medicine) Kate,  Lonni CROME, MD as PCP - Cardiology (Cardiology) Caresse Cough, MD as Referring Physician (Ophthalmology) Gerlean Lynwood BIRCH, MD (Inactive) as Consulting Physician (Vascular Surgery) [name] Norleen SAILOR, MD as Consulting Physician (Gastroenterology)  This Provider for this visit: Treatment Team:  Attending Provider: Geronimo Amel, MD    12/12/2023 -   Chief Complaint  Patient presents with   Follow-up    Pt states he is doing well, denies any concerns. States he would like to cut back on his oxygen .      HPI [name] 89 y.o. -returns for follow-up.  He celebrated his 66st birthday on Christmas Eve 2022.  This upcoming weekend will be his 56th wedding anniversary.  His wife Devere is here.  The caretaker is in [place] visiting her family.  At this point in time he is off his walker he is off his cane.  He is able to walk at least 50 or 100 yards on flat ground without any assist.  Wife prefers for him to have a cane but he will not.  They both report that after starting methotrexate mid November 2024 there is dramatic improvement in his health.  He still on prednisone  15 mg/day.  They got a call [facility] [employer] to reduce the prednisone .  His anemia has nearly resolved.  He is more active now.  He wants to come off the oxygen .  Both at rest and with simple exercise of 200 feet or sitting and standing 10 times he did not desaturate.  He did have a CT angiogram with Dr. Serene in December 2024 and his ILD is still persistent but is not worse per the report.  They also wanted know about stopping Eliquis  but his PE was submassive and is less than a-year-old.  He is on low-dose.  I strongly advised that they continue this for  1-2 years and then reassess.  He still has his Foley catheter.  Wife reports that he is very committed to various community causes and if is not active he gets depressed.      [place] Symptom Assessment Numerical Scale 0 is no problem -> 10 worst problem 08/08/2023   08/29/2023  10/17/2023  12/12/2023   No Pain -> Worst pain 5 0 3   No Tiredness -> Worset tiredness 8 7 3 2   No Nausea -> Worst nausea 7 6 6 2   No Depression -> Worst depression 5 7 5 4   No Anxiety -> Worst Anxiety 6 6 6 6   No Drowsiness -> Worst Drowsiness 0 5 0   Best appetite-> Worst Appetitle 5 4 5    Best Feeling of well being -> Worst feeling 6 3 1    No dyspnea-> Worst dyspnea 1 4 2    Other problem (none -> severe) x x x   Completed by  Care guver ad patietn Care giver Patient and care igive            Simple office walk 224 (66+46 x 2) feet Pod A at [facility] x  3 laps goal with forehead probe 08/08/2023  08/29/2023  10/17/2023  12/12/2023   O2 used ra ra  ra  Number laps completed Sit stand x 5  Sitting in wheel chair  Sit stand stan x10, and 200 feet flat ground  Comments about pace Slow but can do   VERY SLOW  Resting Pulse Ox/HR 100% and 125/min 97% RA At rest 100% RA at rest - forehead probe 99% ahd HR 73  Final Pulse Ox/HR 100%  and 134/min   96% and HR 93  Desaturated </= 88% no     Desaturated <= 3% points no     Got Tachycardic >/= 90/min yes     Symptoms at end of test x   Not dyspneic  Miscellaneous comments tachycardia         PFT      No data to display            LAB RESULTS last 96 hours No results found.  LAB RESULTS last 90 days Recent Results (from the past 2160 hours)  Retic Panel     Status: Abnormal   Collection Time: 10/11/23 12:59 PM  Result Value Ref Range   Retic Ct Pct 1.2 0.4 - 3.1 %   RBC. 4.00 (L) 4.22 - 5.81 MIL/uL   Retic Count, Absolute 48.4 19.0 - 186.0 K/uL   Immature Retic Fract 11.1 2.3 - 15.9 %   Reticulocyte Hemoglobin 35.5 >27.9 pg    Comment:         Given the high negative predictive value of a RET-He result > 32 pg iron deficiency is essentially excluded. If this patient is anemic other etiologies should be considered. Performed at [facility], [address], [place], [place] [address]   Iron and Iron Binding Capacity ([facility] only)     Status: Abnormal   Collection Time: 10/11/23 12:59 PM  Result Value Ref Range   Iron 58 45 - 182 ug/dL   TIBC 757 (L) 749 - 549 ug/dL   Saturation Ratios 24 17.9 - 39.5 %   UIBC 184 117 - 376 ug/dL    Comment: Performed at [facility], [address] [place], [place] [address]  Ferritin     Status: None   Collection Time: 10/11/23 12:59 PM  Result Value Ref Range   Ferritin 86 24 - 336 ng/mL    Comment: Performed at [facility], [address], [place], [place] [address]  CMP (Cancer Center only)     Status: Abnormal   Collection Time: 10/11/23 12:59 PM  Result Value Ref Range   Sodium 139 135 - 145 mmol/L   Potassium 4.4 3.5 - 5.1 mmol/L   Chloride 103 98 - 111 mmol/L   CO2 31 22 - 32 mmol/L   Glucose, Bld 111 (H) 70 - 99 mg/dL    Comment: Glucose reference range applies only to samples taken after fasting for at least 8 hours.   BUN 38 (H) 8 - 23 mg/dL   Creatinine 9.02 9.38 - 1.24 mg/dL   Calcium  10.0 8.9 - 10.3 mg/dL   Total Protein 6.9 6.5 - 8.1 g/dL   Albumin  3.6 3.5 - 5.0 g/dL   AST 18 15 - 41 U/L   ALT 16 0 - 44 U/L   Alkaline Phosphatase 80 38 - 126 U/L   Total Bilirubin 0.3 <1.2 mg/dL   GFR, Estimated >39 >39 mL/min    Comment: (NOTE) Calculated using the CKD-EPI Creatinine Equation (2021)    Anion gap 5 5 - 15    Comment: Performed at [facility], [address] [place], [place] [address]  CBC with Differential (Cancer Center Only)     Status: Abnormal   Collection Time: 10/11/23 12:59 PM  Result Value Ref Range   WBC Count 14.5 (H) 4.0 - 10.5 K/uL   RBC 4.11 (L)  4.22 - 5.81 MIL/uL   Hemoglobin 12.6 (L) 13.0 - 17.0  g/dL   HCT 61.1 (L) 60.9 - 47.9 %   MCV 94.4 80.0 - 100.0 fL   MCH 30.7 26.0 - 34.0 pg   MCHC 32.5 30.0 - 36.0 g/dL   RDW 80.5 (H) 88.4 - 84.4 %   Platelet Count 307 150 - 400 K/uL   nRBC 0.0 0.0 - 0.2 %   Neutrophils Relative % 83 %   Neutro Abs 12.1 (H) 1.7 - 7.7 K/uL   Lymphocytes Relative 8 %   Lymphs Abs 1.2 0.7 - 4.0 K/uL   Monocytes Relative 7 %   Monocytes Absolute 1.0 0.1 - 1.0 K/uL   Eosinophils Relative 1 %   Eosinophils Absolute 0.1 0.0 - 0.5 K/uL   Basophils Relative 0 %   Basophils Absolute 0.0 0.0 - 0.1 K/uL   Immature Granulocytes 1 %   Abs Immature Granulocytes 0.08 (H) 0.00 - 0.07 K/uL    Comment: Performed at [facility], [address], [place], [place] [address]  Retic Panel     Status: Abnormal   Collection Time: 12/06/23  2:52 PM  Result Value Ref Range   Retic Ct Pct 1.4 0.4 - 3.1 %   RBC. 3.98 (L) 4.22 - 5.81 MIL/uL   Retic Count, Absolute 57.3 19.0 - 186.0 K/uL   Immature Retic Fract 16.2 (H) 2.3 - 15.9 %   Reticulocyte Hemoglobin 38.9 >27.9 pg    Comment:        Given the high negative predictive value of a RET-He result > 32 pg iron deficiency is essentially excluded. If this patient is anemic other etiologies should be considered. Performed at [facility], [address], [place], [place] [address]   Iron and Iron Binding Capacity ([facility] only)     Status: None   Collection Time: 12/06/23  2:53 PM  Result Value Ref Range   Iron 53 45 - 182 ug/dL   TIBC 704 749 - 549 ug/dL   Saturation Ratios 18 17.9 - 39.5 %   UIBC 242 117 - 376 ug/dL    Comment: Performed at [facility], [address], [place], [place] [address]  CMP (Cancer Center only)     Status: Abnormal   Collection Time: 12/06/23  2:53 PM  Result Value Ref Range   Sodium 140 135 - 145 mmol/L   Potassium 4.6 3.5 - 5.1 mmol/L   Chloride 103 98 -  111 mmol/L   CO2 29 22 - 32 mmol/L   Glucose, Bld 123 (H) 70 - 99 mg/dL    Comment: Glucose reference range applies only to samples taken after fasting for at least 8 hours.   BUN 40 (H) 8 - 23 mg/dL   Creatinine 8.86 9.38 - 1.24 mg/dL   Calcium  10.1 8.9 - 10.3 mg/dL   Total Protein 7.2 6.5 - 8.1 g/dL   Albumin  3.8 3.5 - 5.0 g/dL   AST 17 15 - 41 U/L   ALT 24 0 - 44 U/L   Alkaline Phosphatase 89 38 - 126 U/L   Total Bilirubin 0.4 0.0 - 1.2 mg/dL   GFR, Estimated >39 >39 mL/min    Comment: (NOTE) Calculated using the CKD-EPI Creatinine Equation (2021)    Anion gap 8 5 - 15    Comment: Performed at [facility], [address], [place], [place] [address]  CBC with Differential (Cancer Center Only)     Status: Abnormal   Collection Time: 12/06/23  2:53 PM  Result Value Ref Range   WBC Count 16.5 (H) 4.0 - 10.5 K/uL   RBC 3.90 (L) 4.22 - 5.81 MIL/uL   Hemoglobin 13.1 13.0 - 17.0 g/dL   HCT 62.2 (L) 60.9 - 47.9 %   MCV 96.7 80.0 - 100.0 fL   MCH 33.6 26.0 - 34.0 pg   MCHC 34.7 30.0 - 36.0 g/dL   RDW 83.7 (H) 88.4 - 84.4 %   Platelet Count 329 150 - 400 K/uL   nRBC 0.0 0.0 - 0.2 %   Neutrophils Relative % 86 %   Neutro Abs 14.2 (H) 1.7 - 7.7 K/uL   Lymphocytes Relative 7 %   Lymphs Abs 1.1 0.7 - 4.0 K/uL   Monocytes Relative 5 %   Monocytes Absolute 0.9 0.1 - 1.0 K/uL   Eosinophils Relative 0 %   Eosinophils Absolute 0.0 0.0 - 0.5 K/uL   Basophils Relative 1 %   Basophils Absolute 0.1 0.0 - 0.1 K/uL   Immature Granulocytes 1 %   Abs Immature Granulocytes 0.21 (H) 0.00 - 0.07 K/uL    Comment: Performed at [facility], [address], [place], [place] [address]  Ferritin     Status: None   Collection Time: 12/06/23  3:02 PM  Result Value Ref Range   Ferritin 64 24 - 336 ng/mL    Comment: Performed at [facility], [address] [place], [place] [address]         has a past medical history of AAA  (abdominal aortic aneurysm) (HCC), Abnormal EKG, Allergic rhinitis, Allergy, Benign neoplasm of colon (04/14/2010), Carpal tunnel syndrome, Cataract, Decubitus ulcer of coccygeal region, stage 2 (HCC) (07/06/2023), Degenerative disc disease, Disturbances metabolism of methionine, homocystine, and cystathionine (HCC), Elevated prostate specific antigen (PSA), Elevated PSA (07/06/2023), Hearing loss, Hyperlipidemia, ILD (interstitial lung disease) (HCC) (07/06/2023), Impotence of organic origin, Internal hemorrhoids, Leukocytosis (07/06/2023), Neck pain, Otosclerosis of both ears, Peripheral vascular disease (HCC), Plantar fasciitis, Polymyalgia rheumatica (HCC), Polymyalgia rheumatica (HCC) (07/06/2023), Rotator cuff syndrome of left shoulder, Scoliosis, and Shoulder pain.   reports that he has never smoked. He has never used smokeless tobacco.  Past Surgical History:  Procedure Laterality Date   ABDOMINAL AORTIC ENDOVASCULAR STENT GRAFT N/A 08/16/2017   Procedure: ABDOMINAL AORTIC ENDOVASCULAR STENT GRAFT insertion;  Surgeon: Serene Gaile ORN, MD;  Location: [facility] OR;  Service: Vascular;  Laterality: N/A;   Actinic keratosis removal  06/21/2010   Left shoulder; Dr. Jadine   COLONOSCOPY     COLONOSCOPY W/ POLYPECTOMY     DUPUYTREN CONTRACTURE RELEASE Right 12/05/2013   Procedure: DUPUYTREN CONTRACTURE RELEASE RIGHT LONG, RING AND SMALL FINGERS;  Surgeon: [name]., MD;  Location: [facility];  Service: Orthopedics;  Laterality: Right;   Implant penile pump  2001   IR ANGIOGRAM PELVIS SELECTIVE OR SUPRASELECTIVE  12/11/2020   IR ANGIOGRAM SELECTIVE EACH ADDITIONAL VESSEL  12/11/2020   IR EMBO ARTERIAL NOT HEMORR HEMANG INC GUIDE ROADMAPPING  12/11/2020   IR RADIOLOGIST EVAL & MGMT  07/30/2020   IR RADIOLOGIST EVAL & MGMT  08/16/2023   IR US  GUIDE VASC ACCESS RIGHT  12/11/2020   Resection of appendix and tip of rectum  February 2005   STAPEDECTOMY Bilateral 1985, 1988   [facility]    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   Rosuvastatin  Other (See Comments)    Stopped taking due to  feeling achy     Codeine Nausea And Vomiting   Morphine  And Codeine Nausea And Vomiting   Omnicef [Cefdinir] Other (See Comments)    Abdominal pain    Immunization History  Administered Date(s) Administered   Dtap, Unspecified 06/29/2010   Influenza, High Dose Seasonal PF 09/01/2017, 08/31/2022   Influenza, Quadrivalent, Recombinant, Inj, Pf 09/01/2018, 08/31/2019, 08/19/2020, 09/24/2021   Influenza,inj,Quad PF,6+ Mos 08/20/2013, 08/26/2014, 09/23/2015, 10/04/2016   Influenza-Unspecified 09/22/2006, 09/26/2007, 10/13/2012   PFIZER(Purple Top)SARS-COV-2 Vaccination 12/10/2019, 12/28/2019, 08/10/2020   Pneumococcal Conjugate-13 05/30/2018   Pneumococcal Polysaccharide-23 07/21/1998, 10/26/2022   Tdap 06/29/2010    Family History  Problem Relation Age of Onset   Heart disease Mother    Emphysema Mother    Leukemia Brother        Chronic lymphocytic leukemia   Colon cancer Neg Hx    Esophageal cancer Neg Hx    Rectal cancer Neg Hx    Stomach cancer Neg Hx      Current Outpatient Medications:    acetaminophen  (TYLENOL ) 325 MG tablet, Take 2 tablets (650 mg total) by mouth every 6 (six) hours as needed for mild pain (or Fever >/= 101)., Disp: , Rfl:    apixaban  (ELIQUIS ) 2.5 MG TABS tablet, Take 2.5 mg by mouth 2 (two) times daily., Disp: , Rfl:    bisacodyl 5 MG EC tablet, Take 10 mg by mouth daily as needed for moderate constipation., Disp: , Rfl:    butalbital -acetaminophen -caffeine  (FIORICET ) 50-325-40 MG tablet, Take 1 tablet by mouth daily as needed for headache., Disp: 14 tablet, Rfl: 0   cholecalciferol (VITAMIN D3) 25 MCG (1000 UNIT) tablet, Take 1,000 Units by mouth daily., Disp: , Rfl:    diclofenac  Sodium (VOLTAREN ) 1 % GEL, Apply 2 g topically 4 (four) times daily as needed (shoulder pain).,  Disp: 2 g, Rfl: 0   docusate sodium  (COLACE) 100 MG capsule, Take 100 mg by mouth daily., Disp: , Rfl:    feeding supplement (ENSURE ENLIVE / ENSURE PLUS) LIQD, Take 237 mLs by mouth 3 (three) times daily between meals., Disp: 237 mL, Rfl: 12   finasteride  (PROSCAR ) 5 MG tablet, Take 5 mg by mouth daily., Disp: , Rfl:    fluorometholone  (FML) 0.1 % ophthalmic suspension, Place 1 drop into the right eye at bedtime. , Disp: , Rfl:    folic acid  (FOLVITE ) 1 MG tablet, Take 1 mg by mouth daily., Disp: , Rfl:    L-Methylfolate-B12-B6-B2 (METAFOLBIC) 04-28-49-5 MG TABS, TAKE ONE TABLET TWICE DAILY, Disp: 60 tablet, Rfl: 2   LORazepam  (ATIVAN ) 0.5 MG tablet, Take 0.5 mg by mouth daily. 0.25 am, 0.25 pm,  0.5 at night, Disp: , Rfl:    magnesium  hydroxide (MILK OF MAGNESIA) 400 MG/5ML suspension, Take by mouth daily as needed for mild constipation., Disp: , Rfl:    melatonin 3 MG TABS tablet, Take 1 tablet (3 mg total) by mouth at bedtime. (Patient taking differently: Take 5 mg by mouth as needed.), Disp: , Rfl: 0   methotrexate (RHEUMATREX) 2.5 MG tablet, Take 2.5 mg by mouth once a week., Disp: , Rfl:    metoprolol  succinate (TOPROL  XL) 25 MG 24 hr tablet, Take 1 tablet (25 mg total) by mouth daily., Disp: 90 tablet, Rfl: 3   mirtazapine  (REMERON ) 15 MG tablet, Take 15 mg by mouth at bedtime. Pt taking 30 mg, Disp: , Rfl:    Multiple Vitamins-Minerals (MULTIVITAMIN PO), Take 1 tablet by mouth daily. , Disp: , Rfl:    ondansetron  (  ZOFRAN ) 4 MG tablet, Take 1 tablet (4 mg total) by mouth every 4 (four) hours as needed for nausea or vomiting., Disp: 100 tablet, Rfl: 6   polyethylene glycol (MIRALAX  / GLYCOLAX ) 17 g packet, Take 17 g by mouth 2 (two) times daily., Disp: , Rfl:    predniSONE  (DELTASONE ) 20 MG tablet, Take 2 tablets (40 mg total) by mouth daily with breakfast. (Patient taking differently: Take 10 mg by mouth daily with breakfast. 15mg  daily), Disp: , Rfl:    promethazine  (PHENERGAN ) 12.5 MG  tablet, Take 1 tablet (12.5 mg total) by mouth daily as needed for nausea or vomiting., Disp: 30 tablet, Rfl: 6   psyllium (REGULOID) 0.52 g capsule, Take 0.52 g by mouth daily., Disp: , Rfl:    senna-docusate (SENNA-S) 8.6-50 MG tablet, Take 1 tablet by mouth daily., Disp: , Rfl:    sertraline  (ZOLOFT ) 25 MG tablet, Take 1/2 tablet daily x 7 days, then increase to one tablet., Disp: 30 tablet, Rfl: 2   tamsulosin  (FLOMAX ) 0.4 MG CAPS capsule, Take 0.4 mg by mouth at bedtime., Disp: , Rfl:    [place] 1200 MG CAPS, Take 1 tablet by mouth daily., Disp: , Rfl:    traMADol  (ULTRAM ) 50 MG tablet, Take 50 mg by mouth every 4 (four) hours as needed for moderate pain or severe pain., Disp: , Rfl:    zolpidem  (AMBIEN ) 10 MG tablet, Take 5 mg by mouth at bedtime as needed for sleep., Disp: , Rfl:       Objective:   Vitals:   12/12/23 1115  BP: 101/62  Pulse: 70  SpO2: 99%  Weight: 163 lb 9.6 oz (74.2 kg)  Height: 6' (1.829 m)    Estimated body mass index is 22.19 kg/m as calculated from the following:   Height as of this encounter: 6' (1.829 m).   Weight as of this encounter: 163 lb 9.6 oz (74.2 kg).  @WEIGHTCHANGE @  [employer]   12/12/23 1115  Weight: 163 lb 9.6 oz (74.2 kg)     Physical Exam   General: No distress. Looks better O2 at rest: yes but does not need it Cane present: no Sitting in wheel chair: no Frail: yes but bet Obese: no Neuro: Alert and Oriented x 3. GCS 15. Speech normal Psych: Pleasant Resp:  Barrel Chest - no.  Wheeze - no, Crackles - yes base, No overt respiratory distress CVS: Normal heart sounds. Murmurs - no Ext: Stigmata of Connective Tissue Disease - no HEENT: Normal upper airway. PEERL +. No post nasal drip        Assessment:       ICD-10-CM   1. Interstitial lung disease (HCC)  J84.9 Pulse oximetry, overnight         Plan:     Patient Instructions     ICD-10-CM   1. Interstitial lung disease (HCC)  J84.9     2. Physical  deconditioning  R53.81      Interstitial lung disease (HCC) DOE  - you have inflammation in lung since 2021/2022: This flared up in April 2024 because of the PE and likely daptomycin . Possible covid last year 2023 played a role.  HRCT aug 2024 with improvement from April 2024 but with inreased residual scar compared to baseline -> CT dec  2024 wit residual scar  - Good oxyge level at rest 1 and 12/12/2023  and walking 200 feet   Plan  - continue prednisone   but taper [facility] - too high risk for lung biopsy but could consider BAL sometime in future - too high risk for anti-fibrotics  -continue night o2 as before   = but will test it on room air and see if you stil need it  - you do not need o2 at rest and with simple exertion for upto 200 feet  Pulmonary embolism April 2024; submassive with RV/LV ratio 1.14  -Currently on Eliquis  and tolerating well but having ongoing anemia that predated pulmonary embolism - noted per PCP stool occult blood positive August 2024 and on low dose eliquis  since then -ECHO Sept 2024: normal RV function and improved  - no recurrence as of 12/12/2023     Plan - contiue low dose eliquis  (Given anemia and stool occult blood positive) - atleast for 1-2 years before we check d-dimer and evalaute for stopping   Elevated ESR x 2  year per PCP Shayne Anes, MD reported 07/04/2023.  Indeterminatate QUantiferon Gold - 2024 x 2 Unclear cause.  - No headachees. - Doubt TB per oral conversation with Dr Fleeta Lim of ID Aug 2024 Persists despite improvement in lungs - DDx PMR, GCA and non-speicific  - 110 on 08/08/23 and unable to reduce prednisne below 10mg  per day.  - saw [facility]: Dr Valera 09/20/23 and started on Methotrexate 10/13/23 and this is helping you  Plan Hopefully methtorexate helps Prednisone  and methotrexate per Dr Valera  Anemia and leukocytosis- new onset February 2024 and ongoing as of August 2024  - S/p 2 units  packed red blood cells early August 2024.  Per PCP Shayne Anes, MD -stool occult blood positive late July/early August 2024. On Iron infusions as of 08/08/2023  - improved hgb to  > 12gm% In NOv 2024 - Dr [name] does not think endoscopy will be helpful in risk v benefit ratio  Plan - [facility] Dr Federico   Elevated PSA 07/03/2023 -follows with Dr Sherwood Edison On [facility] since July/August 2024 - unlikely prostate cancer per DR Edison Sept 2024  Plan  - per DR Edison   Physical deconditioning Fatigue  This is multifactorial -and likely due to posthospitalization, steroid related myopathy, medical illnesses [anemia, age, pulmonary issues] and age-related sarcopenia made worse by medical illnesses.. Ongoing 08/08/2023 and significant . Slolwy improving but still not baseline as of 10/17/2023 and much improved 12/12/2023.     Plan  - continue PT - per PCP Shayne Anes, MD  Goals of care  - home palliative care following. Currently full code  Plan - ongoing per PCP Shayne Anes, MD    Followup  - face to face visit in  10  weeks or video visit ; 30 min to review   FOLLOWUP Return in about 10 weeks (around 02/20/2024) for 30 min visit, ILD, with Dr Geronimo, Face to Face Visit.  (Level 04 E&M 2024: Estb >= 30 min visit type: on-site physical face to visit visit spent in total care time and counseling or/and coordination of care by this undersigned MD - Dr Dorethia Geronimo. This includes one or more of the following on this same day 12/12/2023: pre-charting, chart review, note writing, documentation discussion of test results, diagnostic or treatment recommendations, prognosis, risks and benefits of management options, instructions, education, compliance or risk-factor reduction. It excludes time spent by the CMA or office staff in the care of the patient . Actual time is 35 min)   SIGNATURE    Dr. Dorethia Geronimo, M.D., F.C.C.P,  Pulmonary and Critical  Care Medicine Staff Physician, [facility] Center Director - Interstitial Lung Disease  Program  Pulmonary Fibrosis [facility] Network at [facility] [place], [place], [address]  Pager: [phone], If no answer or between  15:00h - 7:00h: call [phone] Telephone: [phone]  12:51 PM 12/12/2023

## 2023-12-14 DIAGNOSIS — R5382 Chronic fatigue, unspecified: Secondary | ICD-10-CM | POA: Diagnosis not present

## 2023-12-14 DIAGNOSIS — M545 Low back pain, unspecified: Secondary | ICD-10-CM | POA: Diagnosis not present

## 2023-12-14 DIAGNOSIS — M79605 Pain in left leg: Secondary | ICD-10-CM | POA: Diagnosis not present

## 2023-12-14 DIAGNOSIS — R269 Unspecified abnormalities of gait and mobility: Secondary | ICD-10-CM | POA: Diagnosis not present

## 2023-12-18 DIAGNOSIS — M545 Low back pain, unspecified: Secondary | ICD-10-CM | POA: Diagnosis not present

## 2023-12-18 DIAGNOSIS — R5382 Chronic fatigue, unspecified: Secondary | ICD-10-CM | POA: Diagnosis not present

## 2023-12-18 DIAGNOSIS — R269 Unspecified abnormalities of gait and mobility: Secondary | ICD-10-CM | POA: Diagnosis not present

## 2023-12-18 DIAGNOSIS — M79605 Pain in left leg: Secondary | ICD-10-CM | POA: Diagnosis not present

## 2023-12-19 ENCOUNTER — Other Ambulatory Visit: Payer: Self-pay

## 2023-12-19 DIAGNOSIS — I7143 Infrarenal abdominal aortic aneurysm, without rupture: Secondary | ICD-10-CM

## 2023-12-20 DIAGNOSIS — R5382 Chronic fatigue, unspecified: Secondary | ICD-10-CM | POA: Diagnosis not present

## 2023-12-20 DIAGNOSIS — M545 Low back pain, unspecified: Secondary | ICD-10-CM | POA: Diagnosis not present

## 2023-12-20 DIAGNOSIS — R269 Unspecified abnormalities of gait and mobility: Secondary | ICD-10-CM | POA: Diagnosis not present

## 2023-12-20 DIAGNOSIS — M79605 Pain in left leg: Secondary | ICD-10-CM | POA: Diagnosis not present

## 2023-12-20 DIAGNOSIS — M353 Polymyalgia rheumatica: Secondary | ICD-10-CM | POA: Diagnosis not present

## 2023-12-26 ENCOUNTER — Telehealth: Payer: Self-pay | Admitting: Internal Medicine

## 2023-12-26 DIAGNOSIS — M79605 Pain in left leg: Secondary | ICD-10-CM | POA: Diagnosis not present

## 2023-12-26 DIAGNOSIS — Z86711 Personal history of pulmonary embolism: Secondary | ICD-10-CM

## 2023-12-26 DIAGNOSIS — R0609 Other forms of dyspnea: Secondary | ICD-10-CM

## 2023-12-26 DIAGNOSIS — R5382 Chronic fatigue, unspecified: Secondary | ICD-10-CM | POA: Diagnosis not present

## 2023-12-26 DIAGNOSIS — J849 Interstitial pulmonary disease, unspecified: Secondary | ICD-10-CM

## 2023-12-26 DIAGNOSIS — R269 Unspecified abnormalities of gait and mobility: Secondary | ICD-10-CM | POA: Diagnosis not present

## 2023-12-26 DIAGNOSIS — M545 Low back pain, unspecified: Secondary | ICD-10-CM | POA: Diagnosis not present

## 2023-12-26 NOTE — Telephone Encounter (Signed)
Got call from wife and I did informal video chaat iwht patient  [name]: Increased dyspnea x 10 days. Slight increased.  On room air sitting pulse ox is fine according to the wife but when he walks around the house he drops to 88%.  Today went to physical therapy and he had to tolerate down/quit.  I spoke to him and he states there is no cough no edema.  There is no sputum production just worsening shortness of breath.  He is on a lower amount of prednisone at this point in time.  On December 21, 2023 prednisone was reduced from 15 mg daily to 12.5 mg daily and methotrexate will be increased on 12/29/23 to 10 mg per week   He is compliant with his low-dose Eliquis after having had submassive PE almost a year ago  Assessment - Could be undergoing steroid withdrawal -?  Worsening ILD -?  Worsening anemia - ? Heart failure -?  Venous thromboembolism although unlikely  Plan  - cbc bmet, lft, mag, phos, bnp, trop , d-dimer -> then will decide next steps - LABS ORDERED - pls check if this is correct  - might order HRCT based on above results (last Aug 2024) OR ECHO (last sept 2024)      SIGNATURE    Dr. Kalman Shan, M.D., F.C.C.P,  Pulmonary and Critical Care Medicine Staff Physician, [facility] Center Director - Interstitial Lung Disease  Program  Pulmonary Fibrosis [facility] Network at [facility] [place], [place], [address]   Pager: [phone], If no answer  -> Check AMION or Try [phone] Telephone (clinical office): [phone] Telephone (research): [phone]  8:11 PM 12/26/2023

## 2023-12-27 ENCOUNTER — Other Ambulatory Visit (INDEPENDENT_AMBULATORY_CARE_PROVIDER_SITE_OTHER): Payer: Medicare Other

## 2023-12-27 DIAGNOSIS — Z86711 Personal history of pulmonary embolism: Secondary | ICD-10-CM | POA: Diagnosis not present

## 2023-12-27 DIAGNOSIS — R0609 Other forms of dyspnea: Secondary | ICD-10-CM | POA: Diagnosis not present

## 2023-12-27 DIAGNOSIS — J849 Interstitial pulmonary disease, unspecified: Secondary | ICD-10-CM | POA: Diagnosis not present

## 2023-12-27 DIAGNOSIS — G4730 Sleep apnea, unspecified: Secondary | ICD-10-CM | POA: Diagnosis not present

## 2023-12-27 DIAGNOSIS — R0902 Hypoxemia: Secondary | ICD-10-CM | POA: Diagnosis not present

## 2023-12-27 LAB — CBC
HCT: 37.9 % — ABNORMAL LOW (ref 39.0–52.0)
Hemoglobin: 12.4 g/dL — ABNORMAL LOW (ref 13.0–17.0)
MCHC: 32.8 g/dL (ref 30.0–36.0)
MCV: 100.3 fL — ABNORMAL HIGH (ref 78.0–100.0)
Platelets: 302 10*3/uL (ref 150.0–400.0)
RBC: 3.78 Mil/uL — ABNORMAL LOW (ref 4.22–5.81)
RDW: 17 % — ABNORMAL HIGH (ref 11.5–15.5)
WBC: 16.1 10*3/uL — ABNORMAL HIGH (ref 4.0–10.5)

## 2023-12-27 LAB — D-DIMER, QUANTITATIVE: D-Dimer, Quant: 2.59 ug{FEU}/mL — ABNORMAL HIGH (ref <0.50)

## 2023-12-27 LAB — COMPREHENSIVE METABOLIC PANEL
ALT: 23 U/L (ref 0–53)
AST: 18 U/L (ref 0–37)
Albumin: 3.8 g/dL (ref 3.5–5.2)
Alkaline Phosphatase: 81 U/L (ref 39–117)
BUN: 44 mg/dL — ABNORMAL HIGH (ref 6–23)
CO2: 27 meq/L (ref 19–32)
Calcium: 9.6 mg/dL (ref 8.4–10.5)
Chloride: 106 meq/L (ref 96–112)
Creatinine, Ser: 1.01 mg/dL (ref 0.40–1.50)
GFR: 65.2 mL/min (ref >60.00)
Glucose, Bld: 130 mg/dL — ABNORMAL HIGH (ref 70–99)
Potassium: 4.2 meq/L (ref 3.5–5.1)
Sodium: 136 meq/L (ref 135–145)
Total Bilirubin: 0.3 mg/dL (ref 0.2–1.2)
Total Protein: 6.9 g/dL (ref 6.0–8.3)

## 2023-12-27 LAB — SEDIMENTATION RATE: Sed Rate: 69 mm/h — ABNORMAL HIGH (ref 0–20)

## 2023-12-27 LAB — BRAIN NATRIURETIC PEPTIDE: Pro B Natriuretic peptide (BNP): 99 pg/mL (ref 0.0–100.0)

## 2023-12-27 NOTE — Telephone Encounter (Signed)
Labs corrected and will close out this encounter

## 2023-12-28 ENCOUNTER — Telehealth: Payer: Self-pay | Admitting: Internal Medicine

## 2023-12-28 DIAGNOSIS — C44529 Squamous cell carcinoma of skin of other part of trunk: Secondary | ICD-10-CM | POA: Diagnosis not present

## 2023-12-28 DIAGNOSIS — Z85828 Personal history of other malignant neoplasm of skin: Secondary | ICD-10-CM | POA: Diagnosis not present

## 2023-12-28 DIAGNOSIS — R7989 Other specified abnormal findings of blood chemistry: Secondary | ICD-10-CM

## 2023-12-28 DIAGNOSIS — R0609 Other forms of dyspnea: Secondary | ICD-10-CM

## 2023-12-28 LAB — TROPONIN T: Troponin T (Highly Sensitive): 54 ng/L (ref 0–22)

## 2023-12-28 NOTE — Telephone Encounter (Signed)
MR has informed spouse of the plan  I have alerted [facility] that this needs to be scheduled asap  Nothing further needed

## 2023-12-28 NOTE — Telephone Encounter (Signed)
Labs ok/stable but d-dimer has gone up a bit  Plan  - urgent VQ scan today or tomorrow

## 2023-12-29 ENCOUNTER — Ambulatory Visit (HOSPITAL_COMMUNITY)
Admission: RE | Admit: 2023-12-29 | Discharge: 2023-12-29 | Disposition: A | Discharge status: Home / Self Care | Payer: Medicare Other | Source: Ambulatory Visit | Attending: Internal Medicine | Admitting: Internal Medicine

## 2023-12-29 ENCOUNTER — Telehealth: Payer: Self-pay | Admitting: Internal Medicine

## 2023-12-29 ENCOUNTER — Other Ambulatory Visit (HOSPITAL_COMMUNITY): Payer: Self-pay | Admitting: Internal Medicine

## 2023-12-29 DIAGNOSIS — R0609 Other forms of dyspnea: Secondary | ICD-10-CM

## 2023-12-29 DIAGNOSIS — I77819 Aortic ectasia, unspecified site: Secondary | ICD-10-CM | POA: Diagnosis not present

## 2023-12-29 DIAGNOSIS — J849 Interstitial pulmonary disease, unspecified: Secondary | ICD-10-CM

## 2023-12-29 DIAGNOSIS — R7989 Other specified abnormal findings of blood chemistry: Secondary | ICD-10-CM | POA: Diagnosis not present

## 2023-12-29 DIAGNOSIS — J8410 Pulmonary fibrosis, unspecified: Secondary | ICD-10-CM | POA: Diagnosis not present

## 2023-12-29 DIAGNOSIS — I700 Atherosclerosis of aorta: Secondary | ICD-10-CM | POA: Diagnosis not present

## 2023-12-29 MED ORDER — TECHNETIUM TO 99M ALBUMIN AGGREGATED
3.9000 | Freq: Once | INTRAVENOUS | Status: AC | PRN
  Administered 2023-12-29: 3.9 via INTRAVENOUS

## 2023-12-29 NOTE — Telephone Encounter (Signed)
Lm x1 for patient's spouse, Susan(DPR).

## 2023-12-29 NOTE — Telephone Encounter (Signed)
Lab review  -His D-dimer had gone up and his troponin is 59.  So the last night I spoke to Dr. Denzil Magnuson his cardiologist who felt that he has a slight low-grade positive troponin at all times and therefore clinically not significant.  Wife denied any chest pain.  Later she said that a week or so ago he had some right-sided chest pain that was transient and then resolved.  Dr. Gaynelle Arabian recommended just clinical monitoring for this.  Because the D-dimer was up and he is on low-dose Eliquis following the PE had in April 2024.  Got a VQ scan this is normal.  I have communicated the results with the wife.  Plan - We will get a high-resolution CT chest without contrast.  The last one was 6 months ago.  He is now having more shortness of breath and also exertional hypoxemia so we need to make sure the ILD is not worse.  I have ordered the CT chest.  Please communicate with the wife.    Marland Kitchen  SIGNATURE    Dr. Kalman Shan, M.D., F.C.C.P,  Pulmonary and Critical Care Medicine Staff Physician, [facility] Center Director - Interstitial Lung Disease  Program  Pulmonary Fibrosis [facility] Network at [facility] [place], [place], [address]   Pager: [phone], If no answer  -> Check AMION or Try [phone] Telephone (clinical office): [phone] Telephone (research): [phone]  2:50 PM 12/29/2023

## 2024-01-01 ENCOUNTER — Telehealth: Payer: Self-pay | Admitting: Internal Medicine

## 2024-01-01 NOTE — Telephone Encounter (Signed)
Patient needs CT scan to be scheduled. Please call and advise.

## 2024-01-02 NOTE — Telephone Encounter (Signed)
Pt has been Scheduled and Called

## 2024-01-05 ENCOUNTER — Other Ambulatory Visit (HOSPITAL_COMMUNITY): Payer: Medicare Other

## 2024-01-05 DIAGNOSIS — E785 Hyperlipidemia, unspecified: Secondary | ICD-10-CM | POA: Diagnosis not present

## 2024-01-05 DIAGNOSIS — D638 Anemia in other chronic diseases classified elsewhere: Secondary | ICD-10-CM | POA: Diagnosis not present

## 2024-01-05 DIAGNOSIS — E559 Vitamin D deficiency, unspecified: Secondary | ICD-10-CM | POA: Diagnosis not present

## 2024-01-05 DIAGNOSIS — Z125 Encounter for screening for malignant neoplasm of prostate: Secondary | ICD-10-CM | POA: Diagnosis not present

## 2024-01-05 DIAGNOSIS — E2740 Unspecified adrenocortical insufficiency: Secondary | ICD-10-CM | POA: Diagnosis not present

## 2024-01-05 DIAGNOSIS — D649 Anemia, unspecified: Secondary | ICD-10-CM | POA: Diagnosis not present

## 2024-01-05 DIAGNOSIS — M791 Myalgia, unspecified site: Secondary | ICD-10-CM | POA: Diagnosis not present

## 2024-01-05 DIAGNOSIS — E291 Testicular hypofunction: Secondary | ICD-10-CM | POA: Diagnosis not present

## 2024-01-08 ENCOUNTER — Ambulatory Visit (HOSPITAL_COMMUNITY)
Admission: RE | Admit: 2024-01-08 | Discharge: 2024-01-08 | Disposition: A | Discharge status: Home / Self Care | Payer: Medicare Other | Source: Ambulatory Visit | Attending: Internal Medicine | Admitting: Internal Medicine

## 2024-01-08 DIAGNOSIS — R0609 Other forms of dyspnea: Secondary | ICD-10-CM | POA: Diagnosis not present

## 2024-01-08 DIAGNOSIS — I7122 Aneurysm of the aortic arch, without rupture: Secondary | ICD-10-CM | POA: Diagnosis not present

## 2024-01-08 DIAGNOSIS — R918 Other nonspecific abnormal finding of lung field: Secondary | ICD-10-CM | POA: Diagnosis not present

## 2024-01-08 DIAGNOSIS — J8410 Pulmonary fibrosis, unspecified: Secondary | ICD-10-CM | POA: Diagnosis not present

## 2024-01-08 DIAGNOSIS — I7121 Aneurysm of the ascending aorta, without rupture: Secondary | ICD-10-CM | POA: Diagnosis not present

## 2024-01-08 DIAGNOSIS — J849 Interstitial pulmonary disease, unspecified: Secondary | ICD-10-CM | POA: Insufficient documentation

## 2024-01-11 ENCOUNTER — Telehealth: Payer: Self-pay | Admitting: Internal Medicine

## 2024-01-11 NOTE — Telephone Encounter (Signed)
[name] -had overnight pulse oximetry test done on 12/27/2023.  No desaturations on room air recorded  Plan - Does not need nighttime oxygen

## 2024-01-12 DIAGNOSIS — B009 Herpesviral infection, unspecified: Secondary | ICD-10-CM | POA: Diagnosis not present

## 2024-01-12 DIAGNOSIS — E2740 Unspecified adrenocortical insufficiency: Secondary | ICD-10-CM | POA: Diagnosis not present

## 2024-01-12 DIAGNOSIS — M8580 Other specified disorders of bone density and structure, unspecified site: Secondary | ICD-10-CM | POA: Diagnosis not present

## 2024-01-12 DIAGNOSIS — I480 Paroxysmal atrial fibrillation: Secondary | ICD-10-CM | POA: Diagnosis not present

## 2024-01-12 DIAGNOSIS — F0390 Unspecified dementia without behavioral disturbance: Secondary | ICD-10-CM | POA: Diagnosis not present

## 2024-01-12 DIAGNOSIS — N320 Bladder-neck obstruction: Secondary | ICD-10-CM | POA: Diagnosis not present

## 2024-01-12 DIAGNOSIS — Z436 Encounter for attention to other artificial openings of urinary tract: Secondary | ICD-10-CM | POA: Diagnosis not present

## 2024-01-12 DIAGNOSIS — Z0000 Encounter for general adult medical examination without abnormal findings: Secondary | ICD-10-CM | POA: Diagnosis not present

## 2024-01-12 DIAGNOSIS — L98429 Non-pressure chronic ulcer of back with unspecified severity: Secondary | ICD-10-CM | POA: Diagnosis not present

## 2024-01-12 DIAGNOSIS — M25551 Pain in right hip: Secondary | ICD-10-CM | POA: Diagnosis not present

## 2024-01-12 DIAGNOSIS — I700 Atherosclerosis of aorta: Secondary | ICD-10-CM | POA: Diagnosis not present

## 2024-01-12 DIAGNOSIS — J8410 Pulmonary fibrosis, unspecified: Secondary | ICD-10-CM | POA: Diagnosis not present

## 2024-01-12 DIAGNOSIS — M353 Polymyalgia rheumatica: Secondary | ICD-10-CM | POA: Diagnosis not present

## 2024-01-12 NOTE — Telephone Encounter (Signed)
I called and spoke with pts wife (dpr) and informed her of ONO results per MR. Wife verbalized understanding. NFN

## 2024-01-15 DIAGNOSIS — M545 Low back pain, unspecified: Secondary | ICD-10-CM | POA: Diagnosis not present

## 2024-01-15 DIAGNOSIS — M79605 Pain in left leg: Secondary | ICD-10-CM | POA: Diagnosis not present

## 2024-01-15 DIAGNOSIS — R269 Unspecified abnormalities of gait and mobility: Secondary | ICD-10-CM | POA: Diagnosis not present

## 2024-01-15 DIAGNOSIS — R5382 Chronic fatigue, unspecified: Secondary | ICD-10-CM | POA: Diagnosis not present

## 2024-01-16 DIAGNOSIS — R338 Other retention of urine: Secondary | ICD-10-CM | POA: Diagnosis not present

## 2024-01-18 DIAGNOSIS — Z7952 Long term (current) use of systemic steroids: Secondary | ICD-10-CM | POA: Diagnosis not present

## 2024-01-18 DIAGNOSIS — Z79899 Other long term (current) drug therapy: Secondary | ICD-10-CM | POA: Diagnosis not present

## 2024-01-18 DIAGNOSIS — M353 Polymyalgia rheumatica: Secondary | ICD-10-CM | POA: Diagnosis not present

## 2024-01-19 DIAGNOSIS — M545 Low back pain, unspecified: Secondary | ICD-10-CM | POA: Diagnosis not present

## 2024-01-19 DIAGNOSIS — R5382 Chronic fatigue, unspecified: Secondary | ICD-10-CM | POA: Diagnosis not present

## 2024-01-19 DIAGNOSIS — M79605 Pain in left leg: Secondary | ICD-10-CM | POA: Diagnosis not present

## 2024-01-19 DIAGNOSIS — R269 Unspecified abnormalities of gait and mobility: Secondary | ICD-10-CM | POA: Diagnosis not present

## 2024-01-22 DIAGNOSIS — R5382 Chronic fatigue, unspecified: Secondary | ICD-10-CM | POA: Diagnosis not present

## 2024-01-22 DIAGNOSIS — R269 Unspecified abnormalities of gait and mobility: Secondary | ICD-10-CM | POA: Diagnosis not present

## 2024-01-22 DIAGNOSIS — M79605 Pain in left leg: Secondary | ICD-10-CM | POA: Diagnosis not present

## 2024-01-22 DIAGNOSIS — M545 Low back pain, unspecified: Secondary | ICD-10-CM | POA: Diagnosis not present

## 2024-01-23 DIAGNOSIS — Z85828 Personal history of other malignant neoplasm of skin: Secondary | ICD-10-CM | POA: Diagnosis not present

## 2024-01-23 DIAGNOSIS — L723 Sebaceous cyst: Secondary | ICD-10-CM | POA: Diagnosis not present

## 2024-01-23 DIAGNOSIS — L821 Other seborrheic keratosis: Secondary | ICD-10-CM | POA: Diagnosis not present

## 2024-01-23 DIAGNOSIS — L218 Other seborrheic dermatitis: Secondary | ICD-10-CM | POA: Diagnosis not present

## 2024-01-24 ENCOUNTER — Other Ambulatory Visit (HOSPITAL_COMMUNITY): Payer: Self-pay | Admitting: *Deleted

## 2024-01-24 DIAGNOSIS — M79605 Pain in left leg: Secondary | ICD-10-CM | POA: Diagnosis not present

## 2024-01-24 DIAGNOSIS — R5382 Chronic fatigue, unspecified: Secondary | ICD-10-CM | POA: Diagnosis not present

## 2024-01-24 DIAGNOSIS — M545 Low back pain, unspecified: Secondary | ICD-10-CM | POA: Diagnosis not present

## 2024-01-24 DIAGNOSIS — R269 Unspecified abnormalities of gait and mobility: Secondary | ICD-10-CM | POA: Diagnosis not present

## 2024-01-25 ENCOUNTER — Ambulatory Visit (HOSPITAL_COMMUNITY)
Admission: RE | Admit: 2024-01-25 | Discharge: 2024-01-25 | Disposition: A | Discharge status: Home / Self Care | Payer: Medicare Other | Source: Ambulatory Visit | Attending: Internal Medicine | Admitting: Internal Medicine

## 2024-01-25 DIAGNOSIS — M810 Age-related osteoporosis without current pathological fracture: Secondary | ICD-10-CM | POA: Diagnosis not present

## 2024-01-25 MED ORDER — DENOSUMAB 60 MG/ML ~~LOC~~ SOSY: 60.0000 mg | PREFILLED_SYRINGE | Freq: Once | SUBCUTANEOUS | Status: AC

## 2024-01-25 MED ORDER — DENOSUMAB 60 MG/ML ~~LOC~~ SOSY
PREFILLED_SYRINGE | SUBCUTANEOUS | Status: AC
  Administered 2024-01-25: 60 mg via SUBCUTANEOUS
  Filled 2024-01-25: qty 1

## 2024-01-29 DIAGNOSIS — M79605 Pain in left leg: Secondary | ICD-10-CM | POA: Diagnosis not present

## 2024-01-29 DIAGNOSIS — M545 Low back pain, unspecified: Secondary | ICD-10-CM | POA: Diagnosis not present

## 2024-01-29 DIAGNOSIS — R5382 Chronic fatigue, unspecified: Secondary | ICD-10-CM | POA: Diagnosis not present

## 2024-01-29 DIAGNOSIS — R269 Unspecified abnormalities of gait and mobility: Secondary | ICD-10-CM | POA: Diagnosis not present

## 2024-01-30 ENCOUNTER — Encounter: Payer: Self-pay | Admitting: Internal Medicine

## 2024-01-30 NOTE — Addendum Note (Signed)
 Encounter addended by: Merideth Abbey on: 01/30/2024 3:23 PM  Actions taken: Imaging Exam ended, Charge Capture section accepted

## 2024-01-31 DIAGNOSIS — R269 Unspecified abnormalities of gait and mobility: Secondary | ICD-10-CM | POA: Diagnosis not present

## 2024-01-31 DIAGNOSIS — R5382 Chronic fatigue, unspecified: Secondary | ICD-10-CM | POA: Diagnosis not present

## 2024-01-31 DIAGNOSIS — M545 Low back pain, unspecified: Secondary | ICD-10-CM | POA: Diagnosis not present

## 2024-01-31 DIAGNOSIS — M79605 Pain in left leg: Secondary | ICD-10-CM | POA: Diagnosis not present

## 2024-02-05 DIAGNOSIS — R5382 Chronic fatigue, unspecified: Secondary | ICD-10-CM | POA: Diagnosis not present

## 2024-02-05 DIAGNOSIS — M79605 Pain in left leg: Secondary | ICD-10-CM | POA: Diagnosis not present

## 2024-02-05 DIAGNOSIS — M545 Low back pain, unspecified: Secondary | ICD-10-CM | POA: Diagnosis not present

## 2024-02-05 DIAGNOSIS — R269 Unspecified abnormalities of gait and mobility: Secondary | ICD-10-CM | POA: Diagnosis not present

## 2024-02-07 DIAGNOSIS — M79605 Pain in left leg: Secondary | ICD-10-CM | POA: Diagnosis not present

## 2024-02-07 DIAGNOSIS — M545 Low back pain, unspecified: Secondary | ICD-10-CM | POA: Diagnosis not present

## 2024-02-07 DIAGNOSIS — R269 Unspecified abnormalities of gait and mobility: Secondary | ICD-10-CM | POA: Diagnosis not present

## 2024-02-07 DIAGNOSIS — R5382 Chronic fatigue, unspecified: Secondary | ICD-10-CM | POA: Diagnosis not present

## 2024-02-12 DIAGNOSIS — M545 Low back pain, unspecified: Secondary | ICD-10-CM | POA: Diagnosis not present

## 2024-02-12 DIAGNOSIS — M79605 Pain in left leg: Secondary | ICD-10-CM | POA: Diagnosis not present

## 2024-02-12 DIAGNOSIS — R269 Unspecified abnormalities of gait and mobility: Secondary | ICD-10-CM | POA: Diagnosis not present

## 2024-02-12 DIAGNOSIS — R5382 Chronic fatigue, unspecified: Secondary | ICD-10-CM | POA: Diagnosis not present

## 2024-02-14 DIAGNOSIS — R5382 Chronic fatigue, unspecified: Secondary | ICD-10-CM | POA: Diagnosis not present

## 2024-02-14 DIAGNOSIS — M79605 Pain in left leg: Secondary | ICD-10-CM | POA: Diagnosis not present

## 2024-02-14 DIAGNOSIS — R269 Unspecified abnormalities of gait and mobility: Secondary | ICD-10-CM | POA: Diagnosis not present

## 2024-02-14 DIAGNOSIS — M545 Low back pain, unspecified: Secondary | ICD-10-CM | POA: Diagnosis not present

## 2024-02-15 DIAGNOSIS — Z79899 Other long term (current) drug therapy: Secondary | ICD-10-CM | POA: Diagnosis not present

## 2024-02-15 DIAGNOSIS — Z7952 Long term (current) use of systemic steroids: Secondary | ICD-10-CM | POA: Diagnosis not present

## 2024-02-15 DIAGNOSIS — M353 Polymyalgia rheumatica: Secondary | ICD-10-CM | POA: Diagnosis not present

## 2024-02-15 DIAGNOSIS — R809 Proteinuria, unspecified: Secondary | ICD-10-CM | POA: Diagnosis not present

## 2024-02-15 DIAGNOSIS — R700 Elevated erythrocyte sedimentation rate: Secondary | ICD-10-CM | POA: Diagnosis not present

## 2024-02-16 DIAGNOSIS — R338 Other retention of urine: Secondary | ICD-10-CM | POA: Diagnosis not present

## 2024-02-16 DIAGNOSIS — N401 Enlarged prostate with lower urinary tract symptoms: Secondary | ICD-10-CM | POA: Diagnosis not present

## 2024-02-19 ENCOUNTER — Ambulatory Visit: Payer: Medicare Other | Admitting: Internal Medicine

## 2024-02-19 ENCOUNTER — Encounter: Payer: Self-pay | Admitting: Internal Medicine

## 2024-02-19 ENCOUNTER — Telehealth: Payer: Self-pay | Admitting: Internal Medicine

## 2024-02-19 ENCOUNTER — Telehealth: Payer: Self-pay | Admitting: *Deleted

## 2024-02-19 VITALS — BP 99/65 | HR 91 | Temp 97.8°F | Ht 72.0 in | Wt 168.4 lb

## 2024-02-19 DIAGNOSIS — J849 Interstitial pulmonary disease, unspecified: Secondary | ICD-10-CM | POA: Diagnosis not present

## 2024-02-19 DIAGNOSIS — M79605 Pain in left leg: Secondary | ICD-10-CM | POA: Diagnosis not present

## 2024-02-19 DIAGNOSIS — M545 Low back pain, unspecified: Secondary | ICD-10-CM | POA: Diagnosis not present

## 2024-02-19 DIAGNOSIS — R269 Unspecified abnormalities of gait and mobility: Secondary | ICD-10-CM | POA: Diagnosis not present

## 2024-02-19 DIAGNOSIS — R5382 Chronic fatigue, unspecified: Secondary | ICD-10-CM | POA: Diagnosis not present

## 2024-02-19 NOTE — Telephone Encounter (Signed)
   Pre-operative Risk Assessment    Patient Name: [name]  DOB: [birth year] MRN: [id number]   Date of last office visit: 10/1823 DR. [facility] Date of next office visit: 05/21/24 DR. [facility]   Request for Surgical Clearance    Procedure:   SUPRAPUBIC TUBE PLACEMENT  Date of Surgery:  Clearance TBD                                Surgeon:  DR. Modena Slater Surgeon's Group or Practice Name:  [facility] Phone number:  [phone] Fax number:  [phone]   Type of Clearance Requested:   - Medical  - Pharmacy:  Hold Apixaban (Eliquis) x 2-3 DAYS PRIOR   Type of Anesthesia:  Not Indicated   Additional requests/questions:    Elpidio Anis   02/19/2024, 3:46 PM

## 2024-02-19 NOTE — Patient Instructions (Addendum)
 ICD-10-CM   1. Interstitial lung disease (HCC)  J84.9     2. Physical deconditioning  R53.81      Interstitial lung disease (HCC) DOE  - you have inflammation in lung since 2021/2022: This flared up in April 2024 because of the PE and likely daptomycin. Possible covid last year 2023 played a role.  HRCT aug 2024 with improvement from April 2024 but with inreased residual scar compared to baseline -> CT dec  2024 wit residual scar -. CT Feb 2025 seems similar to Aug 2024  - Good oxyge level at rest  and 1 lap and 02/19/2024  and walking 200 feet   Plan  - continue prednisone  and methotrexate per Dr Tami Ribas - too high risk for lung biopsy but could consider BAL sometime in future - too high risk for anti-fibrotics although risk seems better given better conditioning - ok to use oxygen for comfort -continue night o2 as before   - you do not need o2 at rest and with simple exertion for upto 200 feet  Pulmonary embolism April 2024; submassive with RV/LV ratio 1.14 D-dimer still up feb 2025 but VQ negtive     Plan - contiue low dose eliquis (Given anemia and stool occult blood positive) - atleast for 1-2 years before we check d-dimer and evalaute for stopping   Elevated ESR x 2  year per PCP [name], MD reported 07/04/2023.  Indeterminatate QUantiferon Gold - 2024 x 2 Unclear cause.  - No headachees. - Doubt TB per oral conversation with Dr Synthia Innocent of ID Aug 2024 Persists despite improvement in lungs - DDx PMR, GCA and non-speicific  - 110 on 08/08/23 and unable to reduce prednisne below 10mg  per day.  - saw [facility]: Dr Tami Ribas 09/20/23 and started on Methotrexate 10/13/23 and this is helping you - new SPEP elevation March 2025  Plan Prednisone and methotrexate per Dr Tami Ribas Sent message to Dr Leonides Schanz to see you sooner than July 2025  Anemia and leukocytosis- new onset February 2024 and ongoing as of August 2024  - S/p 2 units packed red blood cells early August 2024.   Per PCP [name], MD -stool occult blood positive late July/early August 2024. On Iron infusions as of 08/08/2023  - improved hgb to  > 12gm% In NOv 2024 - Dr Marina Goodell does not think endoscopy will be helpful in risk v benefit ratio  Plan  - [facility] Dr Leonides Schanz = sent message to see you earlier   Elevated PSA 07/03/2023 -follows with Dr Modena Slater On Foley since July/August 2024 - unlikely prostate cancer per DR Alvester Morin Sept 2024  Plan  - per DR Alvester Morin   Physical deconditioning Fatigue  This is multifactorial -and likely due to posthospitalization, steroid related myopathy, medical illnesses [anemia, age, pulmonary issues] and age-related sarcopenia made worse by medical illnesses.. Ongoing 08/08/2023 and significant . Slolwy improving but still not baseline as of 10/17/2023 and much improved 12/12/2023.     Plan  - continue PT - per PCP [name], MD  Goals of care  - home palliative care following. Currently full code  Plan - ongoing per PCP [name], MD    Followup  - face to face visit in  8 weeeks after SPiro/DLCO

## 2024-02-19 NOTE — Progress Notes (Signed)
 Admit date:     01/24/2023  Discharge date: 01/25/2023  Principal Problem:   Fever Patient is a 89 year old male with past medical history of polymyalgia rheumatica on 5 mg of prednisone daily, hyperlipidemia and AAA who while on vacation last June (7 months ago) contracted a viral illness causing some upper respiratory symptoms.  At that time, patient not tested for any viral illnesses including COVID.  Soon after, this viral illness caused his PMR to flareup and patient's prednisone dosage increased from 5 mg to 25 mg (starting August 2023).  Patient's joint pain resolved sometime soon after.  However, since this viral illness, patient has had significant fatigue impacting his life. 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  Patient was seen by his rheumatologist on 2/21 and in attempt to wean down his prednisone, patient started on weekly methotrexate.  (Prednisone not yet weaned until methotrexate felt to be efficacious.)   Patient's wife noted that he has been having fevers that had started Saturday, 2/24.  Patient started first dose of weekly methotrexate Sunday, 2/25.  (Wife and patient are very clear as to when fevers are started and when methotrexate dose was first given, the day after fever started.)  Patient saw his PCP on 2/27 and given new fevers, there were concerns that patient in his steroid-induced immunosuppressed state may have endocarditis and was referred to the emergency room for further evaluation.  In the emergency room, patient noted to have normal lactic acid and a white blood cell count of 14.5 as well as a sed rate of 78.  Brought in for further evaluation.  Fever During patient's entire hospitalization, was not febrile.  Unclear etiology if he has any infection.  Chest x-ray and urinalysis unremarkable.  White blood cell count elevated, but mildly and in the  context of patient being on high-dose of steroids continuously.  Blood cultures drawn and patient was given 1 dose of cefepime and vancomycin.  Patient otherwise asymptomatic (other than complaints of fatigue).  Do not think patient has endocarditis.  He has no clinical signs.  Exam is unremarkable.   Additional possibility could be rheumatologic in nature?  Patient with elevated sed rate despite being on chronic high-dose steroids.  A new autoimmune inflammatory condition may cause a fever, although patient with no other symptoms.   No reason for additional antibiotics.  Will follow blood cultures and if positive will check echocardiogram.  Will plan to discharge.   Addendum: Blood cultures final result negative   Fatigue Had extensive discussion with patient and his wife about his fatigue.  I have a strong suspicion that he may have contracted COVID last summer and since then, has been suffering the effects of long COVID which have caused his fatigue.  (If this is indeed long COVID, discussed with patient and wife about new trials looking at treating long COVID successfully with SSRIs.  This may be something to consider as per patient's PCP.)   See below in regards to prednisone and methotrexate.   For completeness sake, ordered CMV, Epstein-Barr, [place] spotted fever and Lyme disease titers to look for other causes of fatigue.  (Addendum: Following discharge, these titers all came back negative.)   Polymyalgia rheumatica (HCC) Patient has been on 5 mg of prednisone for his polymyalgia rheumatica for some time.  The viral illness that he contracted last summer  triggered a flareup causing joint pain.  This was treated with increasing prednisone dose to 25 mg daily.   There appears to be some confusion in the efficacy of this treatment.  Patient's joint pain did indeed respond to elevated prednisone dose, however attempts to reduce prednisone dose led to worsening of fatigue.  Had extensive  discussion with patient and wife that the fatigue and joint pain are likely unrelated.  Patient probably felt worse with prednisone decreased as prednisone can often cause a feeling of increased energy and wakefulness.   There may have been confusion between patient and providers that patient unable to wean off prednisone due to failure of therapy.  Therefore, discontinuing methotrexate.  Weaning down prednisone very slowly over the next month to get patient back to 5 mg p.o. daily.  Fatigue appears to be independent and as above recommended treatment.  INPATIENT PULM COJSULT 04/06/23   Acute Hypoxic Respiratory Failure in setting possibly of multifocal pna vs eosinophilic pna versus organizing pneumonia Acute PE  PMR on chronic prednisone, immunocompromised  Patient remains on high flow nasal cannula oxygen, unable to titrated down currently Continue IV antibiotics, currently on vancomycin and cefepime, infectious disease following Started on high-dose steroid with Solu-Medrol 125 mg every 6 hours with suspected inflammatory lung disease Follow-up autoimmune panel Will try to get sputum culture and sputum pneumocystis PCR Ideally patient needs bronchoscopy with BAL and transbronchial biopsy but due to high oxygen requirement, will currently hold off Continue subcu Lovenox therapeutic dose Monitor intake and output  Xxxx Renal visit [facility] 02/07/23  Pt reports he was diagnosed with PMR 30 years ago with large proximal aches. He was started on prednisone and felt immediately better. For the next 30 years he took 5 mg a day of prednisone and did very well. 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 In June of last year he developed a virus which consisted of profound fatigue and malaise. This caused his PMR to "explode". He had loss of energy and his sed rate went up. He no longer wanted to exercise. His ESR went up to 101 and his prednisone was increased to 30 mg daily. He felt somewhat better on  this higher dose but not back to normal.   Somewhere in this interim he developed a femoral nerve neuropathy that resolved with aggressive PT.   In August he developed covid with a 102 fever. He did not require hospitalization.   On 2/21 he followed up with rheumatology who recommended tapering the prednisone. He was cut down to 25 mg. He was started on weekly methotrexate but only took one dose.   He was admitted to [facility] at the end of February due to low BP (100 systolic; normal for him is mildly hypertensive), chills, and temp to 101-102. . In the emergency room, patient noted to have normal lactic acid and a white blood cell count of 14.5 as well as a sed rate of 78. During patient's entire hospitalization, was not febrile. Unclear etiology if he has any infection. Chest x-ray and urinalysis unremarkable. White blood cell count elevated, but mildly and in the context of patient being on high-dose of steroids continuously. Blood cultures drawn and patient was given 1 dose of cefepime and vancomycin. Patient otherwise asymptomatic (other than complaints of fatigue). They stopped the methotrexate at the ER.   Since the hospitalization he's been even more fatigued. Denies red eyes, ear pain, severe sinus symptoms, hemoptysis, SOB, chest pain, significant GI symptoms, difficulty  urinating, hematuria, leg swelling, skin rashes, fevers.  Eating/drinking fine. No typical PMR muscle pains, no joint pains. He does endorse night sweats requiring him to change his pajamas.   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 He remains very active with [employer]. He has been a lifelong athlete  Xxxxxxxxxxxxxxxxxxxxxxxxxxxxxxxxxx  [facility] RHEUM VISIT [phone]  Alam Guterrez is a 89 y.o. male with a history of PMR who presents for evaluation of fatigue, fevers, and hematuria noted to have a dilated thoracic aorta and abdominal aortic aneurysm, treated in past with endovascular repair. DDx for his presentation includes  possible large vessel vasculitis, infection of the aorta. We talked about the possibility of GCA today, but he does not have any cranial symptoms. GCA would also not explain the red cell casts in his urine today. He may have ANCA vasculitis, which would potentially tie together his lung nodules and urine findings. He is seeing Dr. Dolores Frame in ID today to rule out infectious causes given his recent fevers so out visit was abbreviated.  - check ANCA panel today - consider PET scan - taper prednisone - ID appointment today - Discussed with Dr. Alberteen Spindle potential admission to hospital to expedite workup   xxxxxxxxxxxxxxxxxxxxxxxxxxxxxxxxxxxxxxxx[id number] Admit date: 03/05/2023  -Discharge date: 03/23/2023  [name] is a 89 y.o. male with medical history significant for polymyalgia rheumatica on chronic prednisone therapy, AAA status post endovascular stent graft in September 2018, chronic diastolic heart failure, recent diagnosis of aortitis, chronic leukocytosis, who is admitted to [facility] on 03/05/2023 with acute pulmonary embolism after presenting from home to [facility] ED complaining of generalized weakness.    [facility] for generalized weakness.  Subsequently, he was admitted to [facility] with similar initial complaint, before being discharged home on 02/13/2023.  During hospitalization at [facility], there was reportedly concern for potential aortitis, for which the patient was started on long-term current spectrum of antibiotics via PICC line, including daptomycin, Rocephin, and Bactrim.  He notes good interval compliance with these antibiotics.   However, over the last 1 to 2 days, he is noted progressive generalized weakness in the absence of any acute focal weakness.  He qualifies his generalized weakness is being significant enough to prevent him from getting out of bed over the last day, which is quite different  than his baseline functionality and activity level.  Denies any associated acute focal weakness.    He has a history of polymyalgia rheumatica and has been on chronic steroid therapy for multiple decades.  Recently, there have been outpatient attempts at reducing his dose of chronic prednisone, and he notes that the most recent dose adjustment down to 15 mg of daily prednisone occurred a few days prior to the onset of his presenting generalized weakness.   He denies any recent subjective fever, chills, rigors, or generalized myalgias.  Denies any new headache, neck stiffness, rash, cough, hemoptysis, shortness of breath, abdominal pain, diarrhea, dysuria or gross hematuria.  He also denies any recent chest pain, palpitations, diaphoresis, nausea, vomiting, dizziness, presyncope, or syncope.  No recent worsening of peripheral edema, or any recent/new lower extremity erythema or calf tenderness.  Denies any known history of prior pulmonary embolism.   Aside from the 2 hospitalizations over the last 6 weeks, denies any additional periods of prolonged diminished ambulatory activity as of recent.  No recent trauma.  No known history of underlying malignancy.  Not on  any blood thinners as an outpatient, including no aspirin.   Per chart review, it appears that he also has chronic leukocytosis with chart review revealing most recent prior white blood cell count of 18.8 on 02/28/2023, with other blood cell data points notable for 25.4 on 02/07/2023.   Denies any known baseline supplemental oxygen requirements.   ESR 105 compared to most recent prior value of 69 on 01/25/2023, CRP 12.8 compared to 75 on 02/28/2023. CBC notable for the following: Open cell count 20,000 with 89% neutroph     CTA chest, relative to CT chest from 12/02/2022, per formal radiology read, shows acute pulmonary embolism at the distal right main pulmonary artery extending into the right middle and lower pulmonary arteries, with CT evidence of  right heart strain.  Additionally, CTA chest shows pulmonary parenchymal findings suggestive of multifocal pneumonia superimposed on a background of interstitial lung disease as well as small bilateral pleural effusions.  CTA chest also shows unchanged aneurysmal dilation of the ascending, transverse, and descending thoracic aorta, without any corresponding evidence of dissection.   EDP discussed the patient's case with the on-call infectious disease physician, Dr. Zenaida Niece dam, he felt that acute pneumonia was slightly less likely given the patient's broad-spectrum IV antibiotics over the last several weeks, including daptomycin, Rocephin, and Bactrim.  He did not recommend changing daptomycin to IV vancomycin, but rather recommended continuation of daptomycin.  He did however recommend consideration for escalation of Rocephin to cefepime, with no additional changes recommended per infectious disease at this tim   Latest Reference Range & Units 03/06/23 21:40  ANA Ab, IFA  Negative  Anti JO-1 0.0 - 0.9 AI <0.2  CCP Antibodies IgG/IgA 0 - 19 units 2  RA Latex Turbid. <14.0 IU/mL 26.0 (H)  Cytoplasmic (C-ANCA) Neg:<1:20 titer <1:20  P-ANCA Neg:<1:20 titer <1:20  Atypical P-ANCA titer Neg:<1:20 titer <1:20  Anti-MPO Antibodies 0.0 - 0.9 units <0.2  Anti-PR3 Antibodies 0.0 - 0.9 units <0.2  C3 Complement 82 - 167 mg/dL 161  Complement C4, Body Fluid 12 - 38 mg/dL 28  SSA (Ro) (ENA) Antibody, IgG 0.0 - 0.9 AI <0.2  SSB (La) (ENA) Antibody, IgG 0.0 - 0.9 AI <0.2  Scleroderma (Scl-70) (ENA) Antibody, IgG 0.0 - 0.9 AI 0.4  (H): Data is abnormally high  Hospital Course:  #1 acute hypoxic respiratory failure in the setting of multifocal pneumonia versus eosinophilic pneumonia from daptomycin versus interstitial lung disease/autoimmune process/acute PE with right lower extremity DVT in the setting of PMR on chronic steroids/immunocompromise state -Patient was seen in consultation by ID, daptomycin  discontinued on 03/05/2023 due to concerns for possible eosinophilic pneumonia. -Patient with improvement currently on 1 L nasal cannula with sats of 97% at rest. -Patient noted to have been placed on high dose steroids which are being tapered down patient currently on prednisone 40 mg daily recommended to continue for the next 3 to 4 weeks at this dose until seen by PCCM in the outpatient setting. -ID recommended continuation of IV Rocephin, oral doxycycline, Bactrim for PJP prophylaxis. -Patient completed course of antibiotics 03/23/2023. -ID recommending continuation of Bactrim for pneumocystis prophylaxis while on high-dose steroids. -Will need outpatient follow-up with ID at [facility] postdischarge.   2.  Paroxysmal atrial fibrillation/atrial flutter -Seen in consultation by cardiology and felt likely secondary to acute pulmonary issues. -Noted to have converted on IV amiodarone which was continued for 24 hours and subsequently discontinued. -Patient subsequently maintained on low-dose Lopressor for rate control, Eliquis for anticoagulation.  3.  PE/right lower extremity DVT -Noted on CT angiogram chest. -Patient noted to have elevated troponins were likely secondary to demand ischemia. -Lower extremity Dopplers done positive for right lower extremity DVT. -2D echo done with abnormal septal motion, EF 55 to 60%,NWMA, no evidence of cor pulmonale in the setting of recent PE. -Patient initially placed on IV heparin and subsequently transition to Eliquis.   -Patient improved clinically.   -Patient will be discharged on Eliquis.   -Outpatient follow-up with pulmonary and PCP.    4.  AAA status post graft and possible aortitis -Patient noted to have been on daptomycin per ID at [facility] which was discontinued during this hospitalization due to concerns for eosinophilic pneumonia. -Patient received a full course of IV Rocephin and IV doxycycline which was completed during the hospitalization on day of  discharge.  -Patient also maintained on Bactrim for PJP prophylaxis per ID recommendations while on steroids.  Patient will be discharged on Bactrim for PJP prophylaxis. -Outpatient follow-up with ID at [facility].   5.  Acute delirium/anxiety -Possibly secondary to hospital delirium versus from hypoxia versus steroid-induced. -Improved clinically.      6.  PMR -Patient noted to chronically be on 5 mg of prednisone at home was on high-dose steroids and tapered down to 40 mg of prednisone daily secondary to problem #1 by day of discharge.  Patient be discharged on prednisone 40 mg daily until follow-up with pulmonary.     8.  Anemia of chronic disease -H&H stable. -No overt bleeding.   9. chronic leukocytosis -Patient noted to have a chronic leukocytosis could be secondary to steroid induced. -Labs with some improvement with leukocytosis by day of discharge.   -Patient completed course of antibiotics during the hospitalization, remained afebrile.   -Outpatient follow up with hematology for follow-up on leukocytosis.   -Ambulatory referral placed. -Patient will be discharged to a skilled nursing facility.   10.  GERD -Patient maintained on PPI, Pepcid, Zofran.         13.  Pressure injury, not present on admission Pressure Injury 03/12/23 Sacrum Mid Stage 1 -  Intact skin with non-blanchable redness of a localized area usually over a bony prominence. Pink non blanching on sacrum (Active)    Previous [facility] pulmonary encounter:  05/01/2023- Dr. Francine Graven     Chief Complaint  Patient presents with   Hospitalization Follow-up      HFU. Still using 4L of O2.     Delyle Weider is a 89 year old male with history of polymyalgia rheumatica on chronic prednisone and recent hospitalization at [facility] where he was diagnosed with aortitis and started on daptomycin and rocephin via PICC line on 3/14. He presented to [facility] on 4/7 with generalized weakness and shortness of breath. CTA Chest on  admission showed pulmonary embolism and bilateral patchy ground-glass and consolidative opacities with interlobular septal thickening superimposed on back ground subpleural reticulation and bronchiolectasis. Echocardiogram did not show RV strain. He was started on heparin drip. He required transfer to the ICU for acute hypoxemic respiratory failure with heated high flow oxygen requirements. He was started on pulse dose steroids and broad spectrum antibiotics. Bronchoscopy was not performed due to his significant oxygen requirements. He was transferred out of the ICU on 4/18 with decreasing O2 requirements. He was discharged on 40mg  of prednisone daily and bactrim prophylaxis. He was started on nystatinc for thrush. He was discharged on 1-2L of oxygen and went to rehab.    He started  having bowel trouble at [facility] rehab for about a month with constipation. He has been started on aggressive bowel regimen and has required manual disimpaction per his wife. He had large explosive bowel movement in clinic today.    His prednisone was reduced to 20mg  daily at rehab due to concerns of intolerance of high dose therapy. He continues bactrim DS 1 tab 3 days per week.    He is working on walking with a walker, he walked 32 steps yesterday without assistance.    He has reduced appetite and is trying to drink protein supplements daily.     06/14/2023 - Interim hx Patient presents today for 1 month follow-up/acute office visit.  He was seen by Dr. Francine Graven on 05/01/2023 hospital follow-up due to respiratory failure in the setting of interstitial lung disease.  Patient has a history of Polymyalgia rheumatica on chronic prednisone.  Differential for respiratory failure includes eosinophilic pneumonia in the setting of daptomycin use versus progressive ILD process.  Patient has mild basilar reticulation on CT chest from March 2022.  Inflammatory workup has been negative.  Follow-up chest x-ray on 05/01/2023 showed patchy  bilateral interstitial and alveolar opacity bilaterally not significantly changed compared to prior chest x-ray from April 2024.    Accompanied by his wife and nursing aid today Breathing is about the same  He is on supplemental oxygen, for the most part he is using 1L oxygen at home  He is on 20mg  prednisone daily with Bactrim double strength 1 tablet 3 days/week for pneumocystitis prophylaxis.  Need to discuss prednisone taper if able    Patient had UTI symptoms (frequency/urgency, fever and confusion) on July 6. He had some air hunger at this time. No associated URI symptoms or cough. Prescribed abx Augmentin x 10 days and doxycycline x10 days. CXR done with Dr. Waynard Edwards, results are not accessible in Epic. He is doing better. He is more tired last several days. He is having some stomach/abdomen discomfort. He does struggle with constipation.  He had a small bowel movement today.  He has been compliant with stool softener.  Plan  - reduce prednisone to 15mg  per day  OV 07/04/2023 -transfer of care to Dr. Marchelle Gearing in the ILD center.  Subjective:  Patient ID: [name], male , DOB: 1930-05-21 , age 69 y.o. , MRN: [id number] , ADDRESS: [address] [place] [address] PCP [name], MD Patient Care Team: [name], MD as PCP - General (Internal Medicine) Little Ishikawa, MD as PCP - Cardiology (Cardiology) Holli Humbles, MD as Referring Physician (Ophthalmology) Pryor Ochoa, MD (Inactive) as Consulting Physician (Vascular Surgery) Hilarie Fredrickson, MD as Consulting Physician (Gastroenterology)  This Provider for this visit: Treatment Team:  Attending Provider: Kalman Shan, MD    07/04/2023 -   Chief Complaint  Patient presents with   Hospitalization Follow-up    New pt. And advise on health     HPI [name] 89 y.o. 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  Here with his wife Darl Pikes and also caretaker Bonita Quin.  History is provided by the  wife, caretaker, a little bit also from the patient also review of the external medical record.  All this was done on 07/03/2023.  Subsequent history also taken on 07/04/2023 the following day from [place].  Neither primary care physician.  The very complex story.  It appears that even at baseline he has had some mild atelectasis changes in his lower lung fields but this was essentially asymptomatic  and he was caring about his activities of daily living.   Has chronic history of polymyalgia rheumatica and has been on low-dose steroids for a few decades.  He has had persistently elevated ESR at least between 70-100 for the last 2 years according to PCP.  At some point because of possible PMR flare he was subjected increased prednisone dose but he always had this fatigue.   Review of the records indicate that starting February 2024 he started having leukocytosis and new onset anemia.  He did have an admission in the hospital at the time for fever.  Subsequently had extensive workup in the spring 2024 at [facility].  He had an admission for this.  They have an considered whether he might have aortitis.  This was in mid March 2024.  He was then treated with daptomycin in the hospital.  He was discharged in early April 2024 with a PICC line but shortly after that at home he collapsed.  Was diagnosed with submassive PE and interstitial pneumonitis.  He had no evidence of RV strain on the echo but his RV-LV ratio was 1.14.  Is on high flow oxygen.  Was given high-dose steroids.  During this time had ICU delirium including according to both primary care physician and D Vandam the infectious disease physician that I spoke to neuro suicidal ideations.  He was then discharged to [facility] where he spent 9 weeks and finally got home on 05/31/2023.  He is currently undergoing intense physical therapy at home.  During all this his high flow oxygen needs have improved he is such that he is only on 1-2 L nasal  cannula at rest.  The caretaker states that sometimes even at rest he is got normal pulse ox and he can go several hours without using oxygen.  He is able to get around with a walker but when he walks a certain distance such as 30 to 40 feet he will get extremely tired.   Overall the wife initially said that he was not any better but they did admit that he is now more conditioned.  He is also having reduced oxygen needs but the fatigue is the main component.  In talking to primary care physician as well fatigue seems to be the biggest component.  In fact a few days ago primary care physician called him in and had his hemoglobin checked it was 7 g%.  He just finished 2 units of transfusion.  He is somewhat better but still feeling extremely fatigued.  His leukocytosis continues and wife is frustrated about this and feels this might be associated with the fatigue.  In terms of his PMR and ILD his current prednisone treatment is at 15 mg/day.  This was slowly tapered since his hospitalization in April 2024.  Dr. Particia Jasper had instructed him to take 15 mg/day.  This was a clinical taper.  Despite different doses of prednisone his ESR is always continue to be high and the reason for this is not known.  In terms of pulmonary embolism he continues his Eliquis low dose His last echocardiogram was in April 2024.  His EF is 55 to 60% and had no evidence of RV strain.   OV 07/10/2023  Subjective:  Patient ID: [name], male , DOB: May 17, 1930 , age 71 y.o. , MRN: [id number] , ADDRESS: [address] [place] [address] PCP [name], MD Patient Care Team: [name], MD as PCP - General (Internal Medicine)  Little Ishikawa, MD as PCP - Cardiology (Cardiology) Holli Humbles, MD as Referring Physician (Ophthalmology) Pryor Ochoa, MD (Inactive) as Consulting Physician (Vascular Surgery) Hilarie Fredrickson, MD as Consulting Physician (Gastroenterology)  This Provider for this  visit: Treatment Team:  Attending Provider: Kalman Shan, MD  Type of visit: Video Virtual Visit Identification of patient [name] with 10-18-1930 and MRN [id number] - 2 person identifier Risks: Risks, benefits, limitations of telephone visit explained. Patient understood and verbalized agreement to proceed Anyone else on call: His wife.  Also the caretaker.  He himself could not join Patient location: His home This provider location: [address]; [place]; [place] [address]. [facility]. [phone]    07/10/2023 -follow-up respiratory failure.   HPI [name] 89 y.o. - In this video visit the purpose is to discuss CT scan results and then to catch up.  I personally visualized the CT scan over time and also showed it to the wife and the caretaker.  He has had some basal chronic ILD changes even 2022 in 2021.  It was extremely mild and could have been passed off as atelectasis.  He also had this in early 2024.  However clearly in April 2024 he had significant groundglass opacities and alveolar airspace filling.  Dense consolidation.  This could easily have been Boop with a high ESR but then his ESR is been chronically elevated for over 2 years according to PCP.  He has responded to steroids.  His lungs have improved and left with residual scar although the level of residual scar seems worse than his baseline.  There is currently very little any groundglass opacities currently on 15 mg prednisone since mid July 2024.  He is stable on 1 L nasal cannula occasionally 2 L according to caretaker.  We took a shared decision making to reduce to 10 mg/day [his baseline was 5 mg to-10 mg/day for his PMR].  Did indicate to the wife there is a small chance that things could flareup but overall this chronic toxicity to deal with with prednisone and therefore we took a shared decision making to reduce.  Regarding his other issues such as fatigue, anemia, other  reasons for elevated ESR he is seeing hematology.  I have also spoken infectious disease doctor.  If he has a bone marrow biopsy they will do cultures.  His QuantiFERON gold was indeterminate.  I spoke to Dr. Algis Liming he does not think patient has TB.  I agree with that.  His anemia also appears to have improved.    OV 08/08/2023  Subjective:  Patient ID: [name], male , DOB: 1930-01-26 , age 47 y.o. , MRN: [id number] , ADDRESS: [address] [place] [address] PCP [name], MD Patient Care Team: [name], MD as PCP - General (Internal Medicine) Little Ishikawa, MD as PCP - Cardiology (Cardiology) Holli Humbles, MD as Referring Physician (Ophthalmology) Pryor Ochoa, MD (Inactive) as Consulting Physician (Vascular Surgery) Hilarie Fredrickson, MD as Consulting Physician (Gastroenterology)  This Provider for this visit: Treatment Team:  Attending Provider: Kalman Shan, MD    08/08/2023 -   Chief Complaint  Patient presents with   Follow-up    F/up on ILD     HPI [name] 89 y.o. -last visit was in mid August 2024.  In the interim I did run into his son and wife outside the hospital at a social event.  They reported that  fatigue continues although he is trying to ambulate and continue with physical therapy.  They have held off on the bone marrow biopsy because of concerns of pain.  The current focus is sto see progress iron infusions.  His most recent labs in our system is from 07/05/2023.  He continues to have normal renal function but he continues to be anemic with a hemoglobin 9.4 g% and is iron deficient.  His thrombocytosis continues.  And his ESR is elevated greater than 100.  He did visit Dr. Denzil Magnuson in cardiology on 07/19/2023 for his SVT was noticed to be doing stable on Toprol.  He was continuing his LOW DOSE Eliquis.  Given his anemia Dr. Yancey Flemings gastroenterologist on 07/24/2023 felt colonoscopy was putting him at high  risk.  Then on 08/03/2023 with complaints of abdominal pain.  Has had CT scan showed significant stool and consistent with constipation.  Today he presents with his caretaker Bonita Quin, wife and his son.  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  -Most recent visit 07/06/2023 with Dr. Algis Liming infectious diseases.  Infection considered low possibility but if bone marrow is ever done for his anemia recommended AFB cultures.  ll#Interstitial lung disease/dyspnea on exertion  -Had flareup in April 2022 associate with PE and also daptomycin.  [Daptomycin given for suspected aortitis at [facility]]  -He remains on oxygen 2 L nasal cannula.  But many times he has been observed to be saturating well when he is off oxygen.  The caretaker states that when she sometimes gives him a shower and helps him with that he is not desaturating when he is not on oxygen..  In fact today after we turned his oxygen off he was able to do a sit/stand test without any desaturations.  He is using oxygen at night but this has not helped his fatigue.  On his CT abdomen lung image August 03, 2023 the ILD is mild.  He is currently on 10 mg prednisone which is a lower dose compared to his last visit.  They are willing to get a taper down to 5 mg per my advice.  #History of pulm embolism April 2024 for submassive PE -     -He is on low-dose Eliquis noW.  I ordered an echo last time but the CMA indicated that this order was not placed.  Therefore have reordered this.  Will also check a D-dimer.  If there is no evidence of pulmonary hypertension this reduces risk for any anesthetic complications from any potential procedures.  #Iron deficiency anemia with heme positive stools.  Status post PRBC on 06/27/2023-Associated thrombocytosis and leukocytosis.    - Initial heme-onc consult 07/05/2023   There is shared decision making taken to continue with iron but through  a infusion form and see if he improves.  Last hemoglobin check was on that day 9.4 g%.  According to the family the iron infusions have not helped.  They deferred a bone marrow biopsy because of concerns of pain.  Although today they indicated that patient's 60 year old brother in [place] has had bone marrow biopsy recently and was able to tolerate it well.  -Most recent infusion was on 07/21/2023.   -CT abdomen September 2024 just showed a lot of stool and constipation   -Dr. Marina Goodell has indicated colonoscopy might be of significant risk although today has been normoxic on room air and was able to do sit and stand  test.-> discussed with him 08/08/2023 - feels supportive care best. He has been in touch with them  #Elevated ESR greater since February 2024 and [facility] health medical records (according to primary care physician present for couple of years]   - No clear etiology known.  He in the past he has not responded to increase prednisone for polymyalgia rheumatica.  We will recheck this again today.  #History of SVT approximately atrial fibrillation April 2024 follows with Dr. Gaynelle Arabian  -Most recent visit was on 07/19/2023.  Noted to be on Toprol  #Elevated PSA 07/03/2023  - The [facility].  For the last 2 months he has now had a Foley catheter because of enlarged prostate they are not aware of a prostate cancer diagnosis.  I did reach out to Dr. Alvester Morin and have asked him to call me back but he has a chronic Foley for the last 2 months now.  No issues currently on this.   -Did discuss with Dr. Alvester Morin.  He does not think prostate cancer is a possibility.  Moreover with the age and no evidence of metastasis it is a nonissue.  He plans for IR embolization of the prostate to shrink it and have the Foley catheter come out.  #Physical deconditioning and fatigue   -His functional strength is improved he is able to transfer better.  He is able to sit stand.  He is  able to walk in the hallway or in the driveway with his walker.  However fatigue is significant.  The son took him to [employer] course and had him sit in the golf cart.  They watched for golf teams come by and after the patient got exhausted and had to go home despite eating a hot dog.   #g symptoms burden  -All significant and see below.  -I do not think the ILD is contributing to the symptom on the other hand is multifactorial and from anemia and high ESR and frailty and cachexia.  -His appetite is diminished but he is able to eat and not gaining weight though.  #Decub ulcer  -Being followed by wound care   #Goals of care  -Remains full code.  They have hesitated on undergoing procedures because of risk and bone marrow biopsy because of pain.   I did indicate to them that sometimes risk might have to be taken as long it is not undue to undergo certain procedures in order to establish clarity in the prognosis and diagnosis.  If reversible etiologies are found then he can improve but if your reversible etiologies are found then at least we have clarity.  They are processing this information. Ultimtely risk v beneift to be dcided by them/patoient an dproceduralists in shared decisin maknig         OV 08/29/2023  Subjective:  Patient ID: [name], male , DOB: [birth year] , age 68 y.o. , MRN: [id number] , ADDRESS: [address] [place] [address] PCP [name], MD Patient Care Team: [name], MD as PCP - General (Internal Medicine) Little Ishikawa, MD as PCP - Cardiology (Cardiology) Holli Humbles, MD as Referring Physician (Ophthalmology) Pryor Ochoa, MD (Inactive) as Consulting Physician (Vascular Surgery) Hilarie Fredrickson, MD as Consulting Physician (Gastroenterology)  This Provider for this visit: Treatment Team:  Attending Provider: Kalman Shan, MD    08/29/2023 -   Chief Complaint  Patient presents with   Follow-up  Echo f/u and labs      HPI [name] 89 y.o. -Prsents with caretaker Bonita Quin and wife Darl Pikes. No hospitalizations since last visit. 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  [Daptomycin given for suspected aortitis at [facility] On 08/08/23: wsa able to sit/stand x5 on room air withou desaturations. So, we reduced his prednisone to 5mg  per day but he got too fatigued. Per wife and Bonita Quin even at 10mg  per day he ws fatigued. So he  is on 15mg  per day and they prefer to stay there. He is wearing 1L [place] now but RA pulse oxx 97%   #History of pulm embolism April 2024 for submassive PE -     -He is on low-dose Eliquis noW. D-dimer in July 2024: is 1.6 and Sept 2024 was 1.85. ECHO 08/28/23 shows Normal RV And is very reasuring. EF 50-55%. Aortic valve calcification +. Mild - mod stenosis . OPVerall reassuring  #Iron deficiency anemia with heme positive stools.  Status post PRBC on 06/27/2023 for a hemoglobin of 8.1 g%- #Associated thrombocytosis and leukocytosis.  Initial heme-onc consult 07/05/2023   -Did see Dr. Leonides Schanz and his physician assistant Karena Addison on 07/05/2023.  Instead of bone marrow biopsy decided to pursue iron infusions.  Last hemoglobin check showed improvement in hemoglobin to 10.4 g% on 08/08/2023 and is significantly improved.   -Most recent infusion was on 07/21/2023.   -CT abdomen August 03, 2023 just showed a lot of stool and constipation   -Dr. Marina Goodell has indicated colonoscopy might be of significant risk .  Most recent follow-up with Dr. Marina Goodell was 08/11/2023.  Continued conservative management has been recommended.  He  was reassured by the CT scan on August 03, 2023.  #Elevated ESR greater since February 2024 and [facility] medical records (according to primary care physician present for couple of years]    - No clear etiology known.  He in the past he has not responded to increase prednisone for polymyalgia rheumatica.  I recheck this again on 08/08/2023 and is again greater than 110.  Dr. Algis Liming does not think this from tuberculosis.  Discussed this with the patient and the family.  I will make a referral to [facility]ology.  I also discussed Dr. Algis Liming who supports patient being seen by rheumatologist for this.  #History of SVT approximately atrial fibrillation April 2024 follows with Dr. Gaynelle Arabian   -Most recent visit was on 07/19/2023.  Noted to be on Toprol  #Elevated PSA 07/03/2023 with BPH   - The been following with [facility].  Since approximately June 2024 h per Dr. Alvester Morin in September 2024 prostate cancer is considered unlikely.  His plan was for interventional radiology embolization of the prostate to make it shrink it and then remove the Foley.  According to the family on 08/29/2019 for the not sure what the status of this is.  With BPH   #Physical deconditioning and fatigue   -His functional strength is improved he is able to transfer better.  He is able to sit stand.  He is  able to walk in the hallway or in the driveway with his walker.  However fatigue is significant.  The family did concede this is somewhat better.  [place] symptom assessment score is improved [see below].  #GI symptoms -Low appetite continues but is somewhat better - Nausea this continues to be a problem.  There is some better perhaps.  The amount of Zofran the using is less.  But is still a significant problem.  Wife thinks is associated with acid reflux.   #Decub ulcer  -Being followed by wound care   #Goals of care  -Remains full code.    OV 10/17/2023  Subjective:  Patient ID:  [name], male , DOB: 29-Oct-1930 , age 40 y.o. , MRN: [id number] , ADDRESS: [address] [place] [address] PCP [name], MD Patient Care Team: [name], MD as PCP - General (Internal Medicine) Little Ishikawa, MD as PCP - Cardiology (Cardiology) Holli Humbles, MD as Referring Physician (Ophthalmology) Pryor Ochoa, MD (Inactive) as Consulting Physician (Vascular Surgery) Hilarie Fredrickson, MD as Consulting Physician (Gastroenterology)  This Provider for this visit: Treatment Team:  Attending Provider: Kalman Shan, MD    10/17/2023 -   Chief Complaint  Patient presents with   Follow-up    F/u after [facility] visit.   Mae Hopes presents for follow-up with his wife Darl Pikes and caretaker.  No hospitalizations.  They have completed the [facility] visit for his high ESR.  He saw rheumatology.  He is now on methotrexate and is taken 1 weekly dose.  Details otherwise below.  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  [Daptomycin given for suspected aortitis at [facility] On 08/08/23: wsa able to sit/stand x5 on room air withou desaturations. So, we reduced his prednisone to 5mg  per day but he got too fatigued. Per wife and Bonita Quin even at 10mg  per day he ws fatigued.  Currently they went to the restaurant and when his oxygen came off used a little bit labored.  Today we turned the oxygen off and the finger pulse ox did not pick up oxygen levels but with this for probe he was 100% on room air at rest.    #History of pulm embolism April 2024 for submassive PE -     -He is on low-dose Eliquis noW. D-dimer in July 2024: is  1.6 and Sept 2024 was 1.85. ECHO 08/28/23 shows Normal RV And is very reasuring. EF 50-55%. Aortic valve calcification +. Mild - mod stenosis . OPVerall reassuring.  No bleeding issues.  #Iron deficiency anemia with heme positive stools.  Status post PRBC on 06/27/2023 for a hemoglobin of 8.1 g%- #Associated thrombocytosis and leukocytosis.  Initial heme-onc consult 07/05/2023   -Did see Dr. Leonides Schanz and his physician assistant Karena Addison on 07/05/2023.  Instead of bone marrow biopsy decided to pursue iron infusions.  Last hemoglobin check showed improvement in hemoglobin to 10.4 g% on 08/08/2023 and is significantly improved.   -Most recent infusion was on 07/21/2023.   -CT abdomen August 03, 2023 just showed a lot of stool and constipation   -Dr. Marina Goodell has indicated colonoscopy might  be of significant risk .  Most recent follow-up with Dr. Marina Goodell was 08/11/2023.  Continued conservative management has been recommended.  He was reassured by the CT scan on August 03, 2023.   -As of 10/18/2023 not getting blood transfusion iron infusions.  Hemoglobin 10/11/2019 was improved to 12.6 g%.  His color is better.   #Elevated ESR greater since February 2024 and [facility] medical records (according to primary care physician present for couple of years]    - No clear etiology known.  He in the past he has not responded to increase prednisone for polymyalgia rheumatica.  I recheck this again on 08/08/2023 and is again greater than 110.  Dr. Algis Liming does not think this from tuberculosis.  At last visit referred to rheumatology.  He is did see Dr. Tami Ribas at [facility] rheumatology on 09/20/2023.  External records reviewed.  Diagnosis of PMR is on the chart but vasculitis/rheumatoid arthritis is under the differential.  ESR was not checked at [facility] but CRP was elevated.  Rheumatoid factor was trace positive.  He has been started on methotrexate.  I did indicate to them that methotrexate onset of action could be anywhere from 4-12  weeks.  The ultimate goal is to slowly taper the prednisone.  [facility]ology is handling this.   #History of SVT approximately atrial fibrillation April 2024 follows with Dr. Gaynelle Arabian   -Most recent visit was on 07/19/2023.  Noted to be on Toprol  #Elevated PSA 07/03/2023 with BPH   - The been following [facility].  Since approximately June 2024 h per Dr. Alvester Morin in September 2024 prostate cancer is considered unlikely.  His plan was for interventional radiology embolization of the prostate to make it shrink it and then remove the Foley.  According to the family on this visit 10/18/2023 he still has his Foley catheter.  They are not sure about the status of interventions here.   #Physical deconditioning and fatigue   -This is his biggest complaint of fatigue.  On this visit 10/18/2023 the family agreed that he is actually getting better.  He is now able to walk some distances without any help.  He is undergoing intense physical therapy exercises with [facility].  Today the patient felt little bit more fatigued but overall they do say that he is better.  His weight transfer and his physicality overall is better.  Still he was sitting in the wheelchair today.  #GI symptoms -Low appetite continues but is somewhat better - Nausea this continues to be a problem as of September 2024.Marland Kitchen  There is some better perhaps.  The amount of Zofran the using is less.  But is still a significant problem.  Wife thinks is associated with acid reflux.  On November 2024 ongoing and stable.   #Decub ulcer  -Being followed by wound care.  This was present in September 2024 but healed as of November 2020 for this visit.   #Goals of care  -Remains full code.  #Overall [place] symptom score appears to be stable/improving.     CT Chest data from date: SPET 2024  - personally visualized and independently interpreted :YES - my findings are: MILD ild AT BASE    OV  12/12/2023  Subjective:  Patient ID: [name], male , DOB: 03/17/1930 , age 37 y.o. , MRN: [id number] , ADDRESS: [address] [place] [address] PCP [name], MD Patient Care Team: [name], MD as PCP - General (Internal Medicine) Bjorn Pippin,  Tanna Savoy, MD as PCP - Cardiology (Cardiology) Holli Humbles, MD as Referring Physician (Ophthalmology) Pryor Ochoa, MD (Inactive) as Consulting Physician (Vascular Surgery) Hilarie Fredrickson, MD as Consulting Physician (Gastroenterology)  This Provider for this visit: Treatment Team:  Attending Provider: Kalman Shan, MD    12/12/2023 -   Chief Complaint  Patient presents with   Follow-up    Pt states he is doing well, denies any concerns. States he would like to cut back on his oxygen.      HPI [name] 89 y.o. -returns for follow-up.  He celebrated his 68st birthday on Christmas Eve 2022.  This upcoming weekend will be his 56th wedding anniversary.  His wife Darl Pikes is here.  The caretaker is in [place] visiting her family.  At this point in time he is off his walker he is off his cane.  He is able to walk at least 50 or 100 yards on flat ground without any assist.  Wife prefers for him to have a cane but he will not.  They both report that after starting methotrexate mid November 2024 there is dramatic improvement in his health.  He still on prednisone 15 mg/day.  They got a call [facility] to reduce the prednisone.  His anemia has nearly resolved.  He is more active now.  He wants to come off the oxygen.  Both at rest and with simple exercise of 200 feet or sitting and standing 10 times he did not desaturate.  He did have a CT angiogram with Dr. Myra Gianotti in December 2024 and his ILD is still persistent but is not worse per the report.  They also wanted know about stopping Eliquis but his PE was submassive and is less than a-year-old.  He is on low-dose.  I strongly advised that they continue this for  1-2 years and then reassess.  He still has his Foley catheter.  Wife reports that he is very committed to various community causes and if is not active he gets depressed.     OV 02/19/2024  Subjective:  Patient ID: [name], male , DOB: [birth year] , age 69 y.o. , MRN: [id number] , ADDRESS: [address] [place] [address] PCP [name], MD Patient Care Team: [name], MD as PCP - General (Internal Medicine) Little Ishikawa, MD as PCP - Cardiology (Cardiology) Holli Humbles, MD as Referring Physician (Ophthalmology) Pryor Ochoa, MD (Inactive) as Consulting Physician (Vascular Surgery) Hilarie Fredrickson, MD as Consulting Physician (Gastroenterology)  This Provider for this visit: Treatment Team:  Attending Provider: Kalman Shan, MD    02/19/2024 -   Chief Complaint  Patient presents with   Follow-up    ILD F/U     HPI [name] 89 y.o. -presents for his ILD.  Since his last visit there was some reports of desaturations at home.  We got a D-dimer it was elevated.  We got a VQ scan but there is no PE.  Because of the high D-dimer advised him to continue his Eliquis.  He is here today with his wife and caretaker Bonita Quin.  He tells me he is on 1 hand that he wants to not be on oxygen.  I did tell him his resting pulse ox and exercise hypoxemia test were normal.  Yet at the same time family also tells me that when he is without oxygen for a while he looks pale and tired and then when he puts his oxygen on  he feels better.  His caretaker Bonita Quin also tells me that if his oxygen is not next to him he starts getting anxious.  I have indicated to him that he can definitely use his portable oxygen for feeling of comfort but at this point in time he does not need oxygen for rest or simple exercise within 200 feet.  He continues his intense physical therapy and this helps him but the wife feels after that he is fatigued.  Overall his quality of life is down  for the last 1 year ever since his admission.  He is frustrated by this.  He states his goal right now is to start driving again his son did take him for driving and that we have caught park a lot and he was able to do some driving.  He also wants to start playing golf again.  He has been visiting [place] to the extent possible.     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  [Daptomycin given for suspected aortitis at [facility]  March 2025: Was able to walk 200 feet without desaturation this very similar to January 2025.  CT scan to Dr. Marchelle Gearing looks very similar to August 2024.  Not much crackles heard.  Advised to follow expectant approach.  Antifibrotic's can be appropriate but it caused intense fatigue which she is already suffering from.     #History of pulm embolism April 2024 for submassive PE -     -He is on low-dose Eliquis noW. D-dimer in July 2024: is 1.6 and Sept 2024 was 1.85. ECHO 08/28/23 shows Normal RV And is very reasuring. EF 50-55%. Aortic valve calcification +. Mild - mod stenosis . OPVerall reassuring.  No bleeding issues.  February 2025 D-dimer elevated VQ scan normal.  Advised to stay on low-dose Eliquis  #Iron deficiency anemia with heme positive stools.  Status post PRBC on 06/27/2023 for a hemoglobin of 8.1 g%- #Associated thrombocytosis and leukocytosis.  Initial heme-onc consult 07/05/2023   -Did see Dr. Leonides Schanz and his physician assistant Karena Addison on 07/05/2023.  Instead of bone marrow biopsy decided to pursue iron infusions.  Last hemoglobin check showed improvement in hemoglobin to 10.4 g% on 08/08/2023 and is significantly  improved.   -Most recent infusion was on 07/21/2023.   -CT abdomen August 03, 2023 just showed a lot of stool and constipation   -Dr. Marina Goodell has indicated colonoscopy might be of significant risk .  Most recent follow-up with Dr. Marina Goodell was 08/11/2023.  Continued conservative management has been recommended.  He was reassured by the CT scan on August 03, 2023.   -As of 10/18/2023 not getting blood transfusion iron infusions.  Hemoglobin 11/13/20540 was improved to 12.6 g%.  His color is better.   -January [address]45 for hemoglobin in the 12's  #Elevated ESR greater since February 2024 and [facility] health medical records (according to primary care physician present for couple of years]    - No clear etiology known.  He in the past he has not responded to increase prednisone for polymyalgia rheumatica.  I recheck this again on 08/08/2023 and is again greater than 110.  Dr. Algis Liming does not think this from tuberculosis.  At last visit referred to rheumatology.  He is did see Dr. Tami Ribas at [facility] rheumatology on 09/20/2023.  External records reviewed.  Diagnosis of PMR is on the chart but vasculitis/rheumatoid arthritis is under the differential.  ESR was not checked at [facility] but CRP was elevated.  Rheumatoid factor was trace positive.  He has been started on methotrexate.  I did indicate to them that methotrexate onset of action could be anywhere from 4-12 weeks.  Currently methotrexate weekly 12.5 along with prednisone 12.5.  When he dropped his prednisone he starts getting more short of breath and fatigue.  Seen by Dr. Tami Ribas recently.     #History of SVT approximately atrial fibrillation April 2024 follows with Dr. Gaynelle Arabian   -Most recent visit was on 07/19/2023.  Noted to be on Toprol  #Elevated PSA 07/03/2023 with BPH   - The been following with [facility].  Since approximately June 2024 h per Dr. Alvester Morin in September 2024 prostate cancer is considered unlikely.  His plan was for  interventional radiology embolization of the prostate to make it shrink it and then remove the Foley.  According to the family on this visit 10/18/2023 he still has his Foley catheter.  They are not sure about the status of interventions here.   #Physical deconditioning and fatigue   -This is his biggest complaint of fatigue.  He was able to walk 200 feet in the office in 1 minute and 35 seconds without assist at all.  He is doing intense physical therapy but he still complains of fatigue.  #GI symptoms -Low appetite continues but is somewhat better - Nausea this continues to be a problem as of September 2024.Marland Kitchen  There is some better perhaps.  The amount of Zofran the using is less.  But is still a significant problem.  Wife thinks is associated with acid reflux.  On November 2024 ongoing and stable.  Not much of an issue March 2025 visit.   #Decub ulcer  -Being followed by wound care.  This was present in September 2024 but healed as of November 2024 for this visit.   #Goals of care  -Remains full code.  #New issue -His serum proteins are high at [facility] March 2025.  The rheumatologist advised him to follow-up with his local oncologist Dr. Leonides Schanz who is an appointment with in July 2025.  I personally have sent a phone message to Dr. Leonides Schanz to see him earlier.  #Overall [place] symptom score appears to be stable/improving.    [place] Symptom Assessment Numerical Scale 0 is no problem -> 10 worst problem 08/08/2023   08/29/2023  10/17/2023  12/12/2023  02/20/2024  No Pain -> Worst pain 5 0 3    No Tiredness -> Worset tiredness 8 7 3 2    No Nausea -> Worst nausea 7 6 6 2    No Depression -> Worst depression 5 7 5 4    No Anxiety -> Worst Anxiety 6 6 6 6    No Drowsiness -> Worst Drowsiness 0 5 0    Best appetite-> Worst Appetitle 5 4 5     Best Feeling of well being -> Worst feeling 6 3 1     No dyspnea-> Worst dyspnea 1 4 2     Other problem (none -> severe) x x x    Completed  by  Care guver ad patietn Care giver Patient and care igive  Simple office walk 224 (66+46 x 2) feet Pod A at [facility] x  3 laps goal with forehead probe 08/08/2023  08/29/2023  10/17/2023  12/12/2023  02/19/2024   O2 used ra ra  ra    Room air  Number laps completed Sit stand x 5  Sitting in wheel chair  Sit stand stan x10, and 200 feet flat ground 200 feet x 1 lap in the office  Comments about pace Slow but can do   VERY SLOW Completed in 1 minute and 35 seconds without any assist  Resting Pulse Ox/HR 100% and 125/min 97% RA At rest 100% RA at rest - forehead probe 99% ahd HR 73 98% with heart rate 94  Final Pulse Ox/HR 100% and 134/min   96% and HR 93 95% with heart rate 116  Desaturated </= 88% no      Desaturated <= 3% points no      Got Tachycardic >/= 90/min yes      Symptoms at end of test x   Not dyspneic   Miscellaneous comments tachycardia         CT Chest data from date: FEb 2025  - personally visualized and independently interpreted : YEs - my findings are: SIMIALR to AUG 2024                                      IMPRESSION: 1. 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 Findings are stable from recent prior examinations but new or progressive from 12/02/2022, possibly due to intervening pneumonia, as seen on 03/05/2023. Findings are categorized as probable UIP per consensus guidelines: Diagnosis of Idiopathic Pulmonary Fibrosis: An Official ATS/ERS/JRS/ALAT Clinical Practice Guideline. Am Rosezetta Schlatter Crit Care Med Vol 198, Iss 5, [id number], Jul 29 2017. 2. 4.6 cm ascending aortic aneurysm and 5.3 cm distal aortic arch aneurysm. Ascending thoracic aortic aneurysm. Recommend semi-annual imaging followup by CTA or MRA and referral to cardiothoracic surgery if not already obtained. This recommendation follows 2010 ACCF/AHA/AATS/ACR/ASA/SCA/SCAI/SIR/STS/SVM Guidelines for the Diagnosis and  Management of Patients With Thoracic Aortic Disease. Circulation. 2010; 121: R604-V409. Aortic aneurysm NOS (ICD10-I71.9) 3. Aortic atherosclerosis (ICD10-I70.0). Coronary artery calcification. 4. Enlarged pulmonic trunk, indicative of pulmonary arterial hypertension.     Electronically Signed   By: Leanna Battles M.D.   On: 01/22/2024 11:30  PFT      No data to display             LAB RESULTS last 96 hours No results found.       has a past medical history of AAA (abdominal aortic aneurysm) (HCC), Abnormal EKG, Allergic rhinitis, Allergy, Benign neoplasm of colon (04/14/2010), Carpal tunnel syndrome, Cataract, Decubitus ulcer of coccygeal region, stage 2 (HCC) (07/06/2023), Degenerative disc disease, Disturbances metabolism of methionine, homocystine, and cystathionine (HCC), Elevated prostate specific antigen (PSA), Elevated PSA (07/06/2023), Hearing loss, Hyperlipidemia, ILD (interstitial lung disease) (HCC) (07/06/2023), Impotence of organic origin, Internal hemorrhoids, Leukocytosis (07/06/2023), Neck pain, Otosclerosis of both ears, Peripheral vascular disease (HCC), Plantar fasciitis, Polymyalgia rheumatica (HCC), Polymyalgia rheumatica (HCC) (07/06/2023), Rotator cuff syndrome of left shoulder, Scoliosis, and Shoulder pain.   reports that he has never smoked. He has never used smokeless tobacco.  Past Surgical History:  Procedure Laterality Date   ABDOMINAL AORTIC ENDOVASCULAR STENT GRAFT N/A 08/16/2017   Procedure: ABDOMINAL AORTIC ENDOVASCULAR STENT GRAFT insertion;  Surgeon: Myra Gianotti,  Fran Lowes, MD;  Location: [facility] OR;  Service: Vascular;  Laterality: N/A;   Actinic keratosis removal  06/21/2010   Left shoulder; Dr. 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   Procedure: DUPUYTREN CONTRACTURE RELEASE RIGHT LONG, RING AND SMALL FINGERS;  Surgeon: Wyn Forster., MD;  Location: [facility];   Service: Orthopedics;  Laterality: Right;   Implant penile pump  2001   IR ANGIOGRAM PELVIS SELECTIVE OR SUPRASELECTIVE  12/11/2020   IR ANGIOGRAM SELECTIVE EACH ADDITIONAL VESSEL  12/11/2020   IR EMBO ARTERIAL NOT HEMORR HEMANG INC GUIDE ROADMAPPING  12/11/2020   IR RADIOLOGIST EVAL & MGMT  07/30/2020   IR RADIOLOGIST EVAL & MGMT  08/16/2023   IR US GUIDE VASC ACCESS RIGHT  12/11/2020   Resection of appendix and tip of rectum  February 2005   STAPEDECTOMY Bilateral 1985, 1988   [employer]    Allergies  Allergen Reactions   Daptomycin Other (See Comments)    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   Rosuvastatin Other (See Comments)    Stopped taking due to feeling achy     Codeine Nausea And Vomiting   Morphine And Codeine Nausea And Vomiting   Omnicef [Cefdinir] Other (See Comments)    Abdominal pain    Immunization History  Administered Date(s) Administered   Dtap, Unspecified 06/29/2010   Influenza, High Dose Seasonal PF 09/01/2017, 08/31/2022   Influenza, Quadrivalent, Recombinant, Inj, Pf 09/01/2018, 08/31/2019, 08/19/2020, 09/24/2021   Influenza,inj,Quad PF,6+ Mos 08/20/2013, 08/26/2014, 09/23/2015, 10/04/2016   Influenza-Unspecified 09/22/2006, 09/26/2007, 10/13/2012   PFIZER(Purple Top)SARS-COV-2 Vaccination 12/10/2019, 12/28/2019, 08/10/2020   Pneumococcal Conjugate-13 05/30/2018   Pneumococcal Polysaccharide-23 07/21/1998, 10/26/2022   Tdap 06/29/2010    Family History  Problem Relation Age of Onset   Heart disease Mother    Emphysema Mother    Leukemia Brother        Chronic lymphocytic leukemia   Colon cancer Neg Hx    Esophageal cancer Neg Hx    Rectal cancer Neg Hx    Stomach cancer Neg Hx      Current Outpatient Medications:    acetaminophen (TYLENOL) 325 MG tablet, Take 2 tablets (650 mg total) by mouth every 6 (six) hours as needed for mild pain (or Fever >/= 101)., Disp: , Rfl:    apixaban (ELIQUIS) 2.5 MG TABS tablet, Take 2.5 mg by mouth 2 (two) times  daily., Disp: , Rfl:    bisacodyl 5 MG EC tablet, Take 10 mg by mouth daily as needed for moderate constipation., Disp: , Rfl:    butalbital-acetaminophen-caffeine (FIORICET) 50-325-40 MG tablet, Take 1 tablet by mouth daily as needed for headache., Disp: 14 tablet, Rfl: 0   cholecalciferol (VITAMIN D3) 25 MCG (1000 UNIT) tablet, Take 1,000 Units by mouth daily., Disp: , Rfl:    diclofenac Sodium (VOLTAREN) 1 % GEL, Apply 2 g topically 4 (four) times daily as needed (shoulder pain)., Disp: 2 g, Rfl: 0   docusate sodium (COLACE) 100 MG capsule, Take 100 mg by mouth daily., Disp: , Rfl:    feeding supplement (ENSURE ENLIVE / ENSURE PLUS) LIQD, Take 237 mLs by mouth 3 (three) times daily between meals., Disp: 237 mL, Rfl: 12   finasteride (PROSCAR) 5 MG tablet, Take 5 mg by mouth daily., Disp: , Rfl:    fluorometholone (FML) 0.1 % ophthalmic suspension, Place 1 drop into the right eye at bedtime. , Disp: , Rfl:  folic acid (FOLVITE) 1 MG tablet, Take 1 mg by mouth daily., Disp: , Rfl:    L-Methylfolate-B12-B6-B2 (METAFOLBIC) 04-28-49-5 MG TABS, TAKE ONE TABLET TWICE DAILY, Disp: 60 tablet, Rfl: 2   LORazepam (ATIVAN) 0.5 MG tablet, Take 0.5 mg by mouth daily. 0.25 am, 0.25 pm,  0.5 at night, Disp: , Rfl:    magnesium hydroxide (MILK OF MAGNESIA) 400 MG/5ML suspension, Take by mouth daily as needed for mild constipation., Disp: , Rfl:    melatonin 3 MG TABS tablet, Take 1 tablet (3 mg total) by mouth at bedtime. (Patient taking differently: Take 5 mg by mouth as needed.), Disp: , Rfl: 0   metoprolol succinate (TOPROL XL) 25 MG 24 hr tablet, Take 1 tablet (25 mg total) by mouth daily., Disp: 90 tablet, Rfl: 3   mirtazapine (REMERON) 15 MG tablet, Take 15 mg by mouth at bedtime. Pt taking 30 mg, Disp: , Rfl:    Multiple Vitamins-Minerals (MULTIVITAMIN PO), Take 1 tablet by mouth daily. , Disp: , Rfl:    ondansetron (ZOFRAN) 4 MG tablet, Take 1 tablet (4 mg total) by mouth every 4 (four) hours as needed  for nausea or vomiting., Disp: 100 tablet, Rfl: 6   polyethylene glycol (MIRALAX / GLYCOLAX) 17 g packet, Take 17 g by mouth 2 (two) times daily., Disp: , Rfl:    promethazine (PHENERGAN) 12.5 MG tablet, Take 1 tablet (12.5 mg total) by mouth daily as needed for nausea or vomiting., Disp: 30 tablet, Rfl: 6   psyllium (REGULOID) 0.52 g capsule, Take 0.52 g by mouth daily., Disp: , Rfl:    senna-docusate (SENNA-S) 8.6-50 MG tablet, Take 1 tablet by mouth daily., Disp: , Rfl:    tamsulosin (FLOMAX) 0.4 MG CAPS capsule, Take 0.4 mg by mouth at bedtime. PT TAKES 2 CAPSULES AT HS, Disp: , Rfl:    Tart Cherry 1200 MG CAPS, Take 1 tablet by mouth daily., Disp: , Rfl:    traMADol (ULTRAM) 50 MG tablet, Take 50 mg by mouth every 4 (four) hours as needed for moderate pain or severe pain., Disp: , Rfl:    zolpidem (AMBIEN) 10 MG tablet, Take 5 mg by mouth at bedtime as needed for sleep., Disp: , Rfl:       Objective:   Vitals:   02/19/24 1402  BP: 99/65  Pulse: 91  Temp: 97.8 F (36.6 C)  TempSrc: Temporal  SpO2: 100%  Weight: 168 lb 6.4 oz (76.4 kg)  Height: 6' (1.829 m)    Estimated body mass index is 22.84 kg/m as calculated from the following:   Height as of this encounter: 6' (1.829 m).   Weight as of this encounter: 168 lb 6.4 oz (76.4 kg).  @WEIGHTCHANGE @  [employer]   02/19/24 1402  Weight: 168 lb 6.4 oz (76.4 kg)     Physical Exam   General: No distress.  Sitting in wheel chair O2 at rest: yes for comfort Gilmer Mor present: no Sitting in wheel chair: yes Frail: some Obese: no Neuro: Alert and Oriented x 3. GCS 15. Speech normal Psych: Pleasant Resp:  Barrel Chest - no.  Wheeze - no, Crackles - some, No overt respiratory distress CVS: Normal heart sounds. Murmurs - no Ext: Stigmata of Connective Tissue Disease - no HEENT: Normal upper airway. PEERL +. No post nasal drip        Assessment:       ICD-10-CM   1. Interstitial lung disease (HCC)  J84.9 Pulmonary  function test  Plan:     Patient Instructions     ICD-10-CM   1. Interstitial lung disease (HCC)  J84.9     2. Physical deconditioning  R53.81      Interstitial lung disease (HCC) DOE  - you have inflammation in lung since 2021/2022: This flared up in April 2024 because of the PE and likely daptomycin. Possible covid last year 2023 played a role.  HRCT aug 2024 with improvement from April 2024 but with inreased residual scar compared to baseline -> CT dec  2024 wit residual scar -. CT Feb 2025 seems similar to Aug 2024  - Good oxyge level at rest  and 1 lap and 02/19/2024  and walking 200 feet   Plan  - continue prednisone  and methotrexate per Dr Tami Ribas - too high risk for lung biopsy but could consider BAL sometime in future - too high risk for anti-fibrotics although risk seems better given better conditioning - ok to use oxygen for comfort -continue night o2 as before   - you do not need o2 at rest and with simple exertion for upto 200 feet  Pulmonary embolism April 2024; submassive with RV/LV ratio 1.14 D-dimer still up feb 2025 but VQ negtive     Plan - contiue low dose eliquis (Given anemia and stool occult blood positive) - atleast for 1-2 years before we check d-dimer and evalaute for stopping   Elevated ESR x 2  year per PCP [name], MD reported 07/04/2023.  Indeterminatate QUantiferon Gold - 2024 x 2 Unclear cause.  - No headachees. - Doubt TB per oral conversation with Dr Synthia Innocent of ID Aug 2024 Persists despite improvement in lungs - DDx PMR, GCA and non-speicific  - 110 on 08/08/23 and unable to reduce prednisne below 10mg  per day.  - saw [facility]: Dr Tami Ribas 09/20/23 and started on Methotrexate 10/13/23 and this is helping you - new SPEP elevation March 2025  Plan Prednisone and methotrexate per Dr Tami Ribas Sent message to Dr Leonides Schanz to see you sooner than July 2025  Anemia and leukocytosis- new onset February 2024 and ongoing as of August  2024  - S/p 2 units packed red blood cells early August 2024.  Per PCP [name], MD -stool occult blood positive late July/early August 2024. On Iron infusions as of 08/08/2023  - improved hgb to  > 12gm% In NOv 2024 - Dr Marina Goodell does not think endoscopy will be helpful in risk v benefit ratio  Plan  - [place] HEme Dr Leonides Schanz = sent message to see you earlier   Elevated PSA 07/03/2023 -follows with Dr Modena Slater On Foley since July/August 2024 - unlikely prostate cancer per DR Alvester Morin Sept 2024  Plan  - per DR Alvester Morin   Physical deconditioning Fatigue  This is multifactorial -and likely due to posthospitalization, steroid related myopathy, medical illnesses [anemia, age, pulmonary issues] and age-related sarcopenia made worse by medical illnesses.. Ongoing 08/08/2023 and significant . Slolwy improving but still not baseline as of 10/17/2023 and much improved 12/12/2023.     Plan  - continue PT - per PCP [name], MD  Goals of care  - home palliative care following. Currently full code  Plan - ongoing per PCP [name], MD    Followup  - face to face visit in  8 weeeks after SPiro/DLCO   FOLLOWUP Return in about 8 weeks (around 04/15/2024) for 30 min visit, after Cleda Daub and DLCO, with Dr Marchelle Gearing.  ( Level 05 visit E&M  2024: Estb >= 40 min n  visit type: on-site physical face to visit  in total care time and counseling or/and coordination of care by this undersigned MD - Dr Kalman Shan. This includes one or more of the following on this same day 02/19/2024: pre-charting, chart review, note writing, documentation discussion of test results, diagnostic or treatment recommendations, prognosis, risks and benefits of management options, instructions, education, compliance or risk-factor reduction. It excludes time spent by the CMA or office staff in the care of the patient. Actual time 45 min)   SIGNATURE    Dr. Kalman Shan, M.D., F.C.C.P,  Pulmonary and Critical Care  Medicine Staff Physician, [facility] Center Director - Interstitial Lung Disease  Program  Pulmonary Fibrosis [facility] at [facility] [place], [place], [address]  Pager: [phone], If no answer or between  15:00h - 7:00h: call [phone] Telephone: [phone]  6:28 PM 02/19/2024

## 2024-02-19 NOTE — Telephone Encounter (Signed)
 Hi Dr Sharen Hint -> seen recently by you in Jan 2025. I Mrarch 2025, he saw rheum at [facility] -> SPEP elevated. THey are wondering if you can see him earlier than July 2025  Thanks    SIGNATURE    Dr. Kalman Shan, M.D., F.C.C.P,  Pulmonary and Critical Care Medicine Staff Physician, [facility] Center Director - Interstitial Lung Disease  Program  Pulmonary Fibrosis [facility] Network at [facility] [place], [place], [address]   Pager: [phone], If no answer  -> Check AMION or Try [phone] Telephone (clinical office): [phone] Telephone (research): [phone]  2:41 PM 02/19/2024

## 2024-02-20 NOTE — Telephone Encounter (Signed)
 Patient with diagnosis of afib/PE on Eliquis for anticoagulation.    Procedure: SUPRAPUBIC TUBE PLACEMENT  Date of procedure: TBD   CHA2DS2-VASc Score = 4   This indicates a 4.8% annual risk of stroke. The patient's score is based upon: CHF History: 1 HTN History: 0 Diabetes History: 0 Stroke History: 0 Vascular Disease History: 1 Age Score: 2 Gender Score: 0      CrCl 47 ml/min Platelet count 324  Per office protocol, patient can hold Eliquis for 2 days prior to procedure.    **This guidance is not considered finalized until pre-operative APP has relayed final recommendations.**

## 2024-02-20 NOTE — Telephone Encounter (Signed)
   Name: [name]  DOB: 1930/09/26  MRN: [id number]  Primary Cardiologist: Little Ishikawa, MD   Preoperative team, please contact this patient and set up a phone call appointment for further preoperative risk assessment. Please obtain consent and complete medication review. Thank you for your help.  I confirm that guidance regarding antiplatelet and oral anticoagulation therapy has been completed and, if necessary, noted below.  Per Pharm D, patient may hold Eliquis for 2-3 days prior to procedure.    I also confirmed the patient resides in the state of [place]. As per [facility] [employer] telemedicine laws, the patient must reside in the state in which the provider is licensed.   Carlos Levering, NP 02/20/2024, 12:21 PM [facility]

## 2024-02-20 NOTE — Telephone Encounter (Signed)
 Left message to call back to schedule tele pre op appt.

## 2024-02-21 DIAGNOSIS — M545 Low back pain, unspecified: Secondary | ICD-10-CM | POA: Diagnosis not present

## 2024-02-21 DIAGNOSIS — R269 Unspecified abnormalities of gait and mobility: Secondary | ICD-10-CM | POA: Diagnosis not present

## 2024-02-21 DIAGNOSIS — M79605 Pain in left leg: Secondary | ICD-10-CM | POA: Diagnosis not present

## 2024-02-21 DIAGNOSIS — R5382 Chronic fatigue, unspecified: Secondary | ICD-10-CM | POA: Diagnosis not present

## 2024-02-22 ENCOUNTER — Other Ambulatory Visit (HOSPITAL_COMMUNITY): Payer: Self-pay | Admitting: Urology

## 2024-02-22 DIAGNOSIS — R339 Retention of urine, unspecified: Secondary | ICD-10-CM

## 2024-02-22 DIAGNOSIS — N401 Enlarged prostate with lower urinary tract symptoms: Secondary | ICD-10-CM

## 2024-02-23 NOTE — Telephone Encounter (Signed)
 Called patient, wife answered and mention they are thinking about the procedure they are not sure they the patient want to proceed. Informed her that if they decided to give [place] a call to set up the appointment.

## 2024-02-26 DIAGNOSIS — R5382 Chronic fatigue, unspecified: Secondary | ICD-10-CM | POA: Diagnosis not present

## 2024-02-26 DIAGNOSIS — M545 Low back pain, unspecified: Secondary | ICD-10-CM | POA: Diagnosis not present

## 2024-02-26 DIAGNOSIS — R269 Unspecified abnormalities of gait and mobility: Secondary | ICD-10-CM | POA: Diagnosis not present

## 2024-02-26 DIAGNOSIS — M79605 Pain in left leg: Secondary | ICD-10-CM | POA: Diagnosis not present

## 2024-02-28 ENCOUNTER — Other Ambulatory Visit: Payer: Self-pay | Admitting: Hematology and Oncology

## 2024-02-28 ENCOUNTER — Telehealth: Payer: Self-pay | Admitting: *Deleted

## 2024-02-28 DIAGNOSIS — R269 Unspecified abnormalities of gait and mobility: Secondary | ICD-10-CM | POA: Diagnosis not present

## 2024-02-28 DIAGNOSIS — R5382 Chronic fatigue, unspecified: Secondary | ICD-10-CM | POA: Diagnosis not present

## 2024-02-28 DIAGNOSIS — M79605 Pain in left leg: Secondary | ICD-10-CM | POA: Diagnosis not present

## 2024-02-28 DIAGNOSIS — M545 Low back pain, unspecified: Secondary | ICD-10-CM | POA: Diagnosis not present

## 2024-02-28 DIAGNOSIS — D509 Iron deficiency anemia, unspecified: Secondary | ICD-10-CM

## 2024-02-28 NOTE — Telephone Encounter (Signed)
 Received call from pt's wife, Darl Pikes. She states that her husband saw his rheumatologist @ [facility] recently and there is some concern for SPEP abnormalities. Pt als o saw Dr. Marchelle Gearing recently and apparently he sent a message or called Dr. Leonides Schanz with similar concerns for additional work up for MGUS/myeloma. Advised that her husband has an appt here next Friday and that Dr. Leonides Schanz can address those concerns at that time. Advised that I would let him know the concerns of both physicians so that needed labs can be ordered.

## 2024-02-28 NOTE — Telephone Encounter (Signed)
 Dr. Leonides Schanz is agreeable to pt coming in tomorrow for additional lab work plus the labs he would order for next week. TCT pt's wife Darl Pikes and offered lab appt for tomorrow, explaining that we may have results by next week on the labs we need to send to an outside lab. She is agreeable to this. Lab appt made for 10 am tomorrow morning. Lab orders have been placed by Dr. Leonides Schanz

## 2024-02-29 ENCOUNTER — Other Ambulatory Visit: Payer: Self-pay

## 2024-02-29 ENCOUNTER — Inpatient Hospital Stay: Attending: Physician Assistant

## 2024-02-29 DIAGNOSIS — D509 Iron deficiency anemia, unspecified: Secondary | ICD-10-CM

## 2024-02-29 DIAGNOSIS — D472 Monoclonal gammopathy: Secondary | ICD-10-CM | POA: Insufficient documentation

## 2024-02-29 DIAGNOSIS — D649 Anemia, unspecified: Secondary | ICD-10-CM | POA: Insufficient documentation

## 2024-02-29 DIAGNOSIS — D72829 Elevated white blood cell count, unspecified: Secondary | ICD-10-CM | POA: Insufficient documentation

## 2024-02-29 DIAGNOSIS — Z806 Family history of leukemia: Secondary | ICD-10-CM | POA: Diagnosis not present

## 2024-02-29 LAB — RETIC PANEL
Immature Retic Fract: 21.4 % — ABNORMAL HIGH (ref 2.3–15.9)
RBC.: 3.59 MIL/uL — ABNORMAL LOW (ref 4.22–5.81)
Retic Count, Absolute: 72.9 10*3/uL (ref 19.0–186.0)
Retic Ct Pct: 2 % (ref 0.4–3.1)
Reticulocyte Hemoglobin: 39.3 pg (ref >27.9)

## 2024-02-29 LAB — CBC WITH DIFFERENTIAL (CANCER CENTER ONLY)
Abs Immature Granulocytes: 0.13 10*3/uL — ABNORMAL HIGH (ref 0.00–0.07)
Basophils Absolute: 0.1 10*3/uL (ref 0.0–0.1)
Basophils Relative: 1 %
Eosinophils Absolute: 0.2 10*3/uL (ref 0.0–0.5)
Eosinophils Relative: 2 %
HCT: 36.2 % — ABNORMAL LOW (ref 39.0–52.0)
Hemoglobin: 12.1 g/dL — ABNORMAL LOW (ref 13.0–17.0)
Immature Granulocytes: 1 %
Lymphocytes Relative: 16 %
Lymphs Abs: 2.3 10*3/uL (ref 0.7–4.0)
MCH: 33.7 pg (ref 26.0–34.0)
MCHC: 33.4 g/dL (ref 30.0–36.0)
MCV: 100.8 fL — ABNORMAL HIGH (ref 80.0–100.0)
Monocytes Absolute: 1 10*3/uL (ref 0.1–1.0)
Monocytes Relative: 7 %
Neutro Abs: 10.4 10*3/uL — ABNORMAL HIGH (ref 1.7–7.7)
Neutrophils Relative %: 73 %
Platelet Count: 282 10*3/uL (ref 150–400)
RBC: 3.59 MIL/uL — ABNORMAL LOW (ref 4.22–5.81)
RDW: 15.8 % — ABNORMAL HIGH (ref 11.5–15.5)
WBC Count: 14.1 10*3/uL — ABNORMAL HIGH (ref 4.0–10.5)
nRBC: 0 % (ref 0.0–0.2)

## 2024-02-29 LAB — CMP (CANCER CENTER ONLY)
ALT: 27 U/L (ref 0–44)
AST: 19 U/L (ref 15–41)
Albumin: 3.7 g/dL (ref 3.5–5.0)
Alkaline Phosphatase: 77 U/L (ref 38–126)
Anion gap: 4 — ABNORMAL LOW (ref 5–15)
BUN: 40 mg/dL — ABNORMAL HIGH (ref 8–23)
CO2: 31 mmol/L (ref 22–32)
Calcium: 9.5 mg/dL (ref 8.9–10.3)
Chloride: 104 mmol/L (ref 98–111)
Creatinine: 1.04 mg/dL (ref 0.61–1.24)
GFR, Estimated: >60 mL/min (ref >60)
Glucose, Bld: 141 mg/dL — ABNORMAL HIGH (ref 70–99)
Potassium: 3.9 mmol/L (ref 3.5–5.1)
Sodium: 139 mmol/L (ref 135–145)
Total Bilirubin: 0.4 mg/dL (ref 0.0–1.2)
Total Protein: 6.9 g/dL (ref 6.5–8.1)

## 2024-02-29 LAB — IRON AND IRON BINDING CAPACITY (CC-WL,HP ONLY)
Iron: 53 ug/dL (ref 45–182)
Saturation Ratios: 20 % (ref 17.9–39.5)
TIBC: 270 ug/dL (ref 250–450)
UIBC: 217 ug/dL (ref 117–376)

## 2024-02-29 LAB — FERRITIN: Ferritin: 57 ng/mL (ref 24–336)

## 2024-03-01 LAB — KAPPA/LAMBDA LIGHT CHAINS
Kappa free light chain: 59.2 mg/L — ABNORMAL HIGH (ref 3.3–19.4)
Kappa, lambda light chain ratio: 3.59 — ABNORMAL HIGH (ref 0.26–1.65)
Lambda free light chains: 16.5 mg/L (ref 5.7–26.3)

## 2024-03-04 DIAGNOSIS — M545 Low back pain, unspecified: Secondary | ICD-10-CM | POA: Diagnosis not present

## 2024-03-04 DIAGNOSIS — R269 Unspecified abnormalities of gait and mobility: Secondary | ICD-10-CM | POA: Diagnosis not present

## 2024-03-04 DIAGNOSIS — R5382 Chronic fatigue, unspecified: Secondary | ICD-10-CM | POA: Diagnosis not present

## 2024-03-04 DIAGNOSIS — M79605 Pain in left leg: Secondary | ICD-10-CM | POA: Diagnosis not present

## 2024-03-05 ENCOUNTER — Other Ambulatory Visit: Payer: Medicare Other

## 2024-03-05 LAB — MULTIPLE MYELOMA PANEL, SERUM
Albumin SerPl Elph-Mcnc: 3.1 g/dL (ref 2.9–4.4)
Albumin/Glob SerPl: 1.1 (ref 0.7–1.7)
Alpha 1: 0.2 g/dL (ref 0.0–0.4)
Alpha2 Glob SerPl Elph-Mcnc: 0.8 g/dL (ref 0.4–1.0)
B-Globulin SerPl Elph-Mcnc: 0.8 g/dL (ref 0.7–1.3)
Gamma Glob SerPl Elph-Mcnc: 1.3 g/dL (ref 0.4–1.8)
Globulin, Total: 3.1 g/dL (ref 2.2–3.9)
IgA: 117 mg/dL (ref 61–437)
IgG (Immunoglobin G), Serum: 768 mg/dL (ref 603–1613)
IgM (Immunoglobulin M), Srm: 866 mg/dL — ABNORMAL HIGH (ref 15–143)
M Protein SerPl Elph-Mcnc: 0.4 g/dL — ABNORMAL HIGH
Total Protein ELP: 6.2 g/dL (ref 6.0–8.5)

## 2024-03-06 DIAGNOSIS — M79605 Pain in left leg: Secondary | ICD-10-CM | POA: Diagnosis not present

## 2024-03-06 DIAGNOSIS — M545 Low back pain, unspecified: Secondary | ICD-10-CM | POA: Diagnosis not present

## 2024-03-06 DIAGNOSIS — R5382 Chronic fatigue, unspecified: Secondary | ICD-10-CM | POA: Diagnosis not present

## 2024-03-06 DIAGNOSIS — R269 Unspecified abnormalities of gait and mobility: Secondary | ICD-10-CM | POA: Diagnosis not present

## 2024-03-07 NOTE — Progress Notes (Signed)
 [facility] [phone]   [phone]  PROGRESS NOTE  Patient Care Team: [name], MD as PCP - General (Internal Medicine) Wendie Hamburg, MD as PCP - Cardiology (Cardiology) Ladon Pickler, MD as Referring Physician (Ophthalmology) Palma Bob, MD (Inactive) as Consulting Physician (Vascular Surgery) Tobin Forts, MD as Consulting Physician (Gastroenterology)  Hematological/Oncological History # Leukocytosis/Thrombocytosis # Normocytic Anemia  06/14/2023: WBC 14.7, Hgb 8.1, MCV 82.7, Plt 584 06/27/2023 : received 2 units of PRBC  07/03/2023 : WBC 20.5, ANC 17.5, Hgb 9.9, MCV 82.6, Plt 566K, sed rate of 114  07/05/2023: establish care with Dr. Rosaline Coma  07/21/2023: received 1 dose of IV monoferric 1000 mg  08/31/2023: WBC 17, Hgb 11.4, MCV 89.7, Plt 357  Interval History:  [name] 89 y.o. male with medical history significant for leukocytosis, thrombocytosis, normocytic anemia who presents for a follow up visit. The patient's last visit was on 12/06/2023. In the interim since the last visit he has had no major changes in his health.   On exam today [name] is accompanied by his wife and caretaker.  He reports he has been well overall interim since her last visit 3 months ago.  He reports that he is had no major changes in his health.  He has managed to avoid contracting flu, coronavirus, or norovirus.  He reports his energy levels are "okay".  He thinks his energy is about a 6 or 7 out of 10.  His appetite has been strong and he is doing his best to eat red meat.  He reports nothing out of the ordinary other than some fatigue.[name]  He denies any infectious symptoms such as runny nose, sore throat, cough.  No reports of fevers, chills, sweats, nausea, vomiting or diarrhea.  A full 10 point ROS is otherwise negative.  The bulk of our discussion focused on the results of his abnormal SPEP and steps moving forward.  Details of this  conversation are noted below.  MEDICAL HISTORY:  Past Medical History:  Diagnosis Date   AAA (abdominal aortic aneurysm) (HCC)    Abnormal EKG    Left atrial abnormality   Allergic rhinitis    Allergy    Benign neoplasm of colon 04/14/2010   3 small polyps on colonoscopy by Dr. Elvin Hammer   Carpal tunnel syndrome    Cataract    Decubitus ulcer of coccygeal region, stage 2 (HCC) 07/06/2023   Degenerative disc disease    Disturbances metabolism of methionine, homocystine, and cystathionine (HCC)    Elevated homocysteine   Elevated prostate specific antigen (PSA)    Elevated PSA 07/06/2023   Hearing loss    Hyperlipidemia    ILD (interstitial lung disease) (HCC) 07/06/2023   Impotence of organic origin    Penile implant   Internal hemorrhoids    Leukocytosis 07/06/2023   Neck pain    Otosclerosis of both ears    Peripheral vascular disease (HCC)    Bilateral femoral bruit   Plantar fasciitis    Polymyalgia rheumatica (HCC)    Polymyalgia rheumatica (HCC) 07/06/2023   Rotator cuff syndrome of left shoulder    Scoliosis    Shoulder pain     SURGICAL HISTORY: Past Surgical History:  Procedure Laterality Date   ABDOMINAL AORTIC ENDOVASCULAR STENT GRAFT N/A 08/16/2017   Procedure: ABDOMINAL AORTIC ENDOVASCULAR STENT GRAFT insertion;  Surgeon: Margherita Shell, MD;  Location: [facility];  Service: Vascular;  Laterality: N/A;   Actinic keratosis removal  06/21/2010  Left shoulder; Dr. 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   Procedure: DUPUYTREN CONTRACTURE RELEASE RIGHT LONG, RING AND SMALL FINGERS;  Surgeon: Amelie Baize., MD;  Location: [facility];  Service: Orthopedics;  Laterality: Right;   Implant penile pump  2001   IR ANGIOGRAM PELVIS SELECTIVE OR SUPRASELECTIVE  12/11/2020   IR ANGIOGRAM SELECTIVE EACH ADDITIONAL VESSEL  12/11/2020   IR EMBO ARTERIAL NOT HEMORR HEMANG INC GUIDE ROADMAPPING   12/11/2020   IR RADIOLOGIST EVAL & MGMT  07/30/2020   IR RADIOLOGIST EVAL & MGMT  08/16/2023   IR US  GUIDE VASC ACCESS RIGHT  12/11/2020   Resection of appendix and tip of rectum  February 2005   STAPEDECTOMY Bilateral 1985, 1988   [facility]    SOCIAL HISTORY: Social History   Socioeconomic History   Marital status: Married    Spouse name: [name]   Number of children: 2   Years of education: Not on file   Highest education level: Not on file  Occupational History   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   Social Drivers of Corporate investment banker Strain: Low Risk  (03/07/2023)   Overall Financial Resource Strain (CARDIA)    Difficulty of Paying Living Expenses: Not hard at all  Food Insecurity: No Food Insecurity (03/15/2023)   Hunger Vital Sign    Worried About Running Out of Food in the Last Year: Never true    Ran Out of Food in the Last Year: Never true  Transportation Needs: No Transportation Needs (03/15/2023)   PRAPARE - Administrator, Civil Service (Medical): No    Lack of Transportation (Non-Medical): No  Physical Activity: Not on file  Stress: Not on file  Social Connections: Not on file  Intimate Partner Violence: Not At Risk (03/15/2023)   Humiliation, Afraid, Rape, and Kick questionnaire    Fear of Current or Ex-Partner: No    Emotionally Abused: No    Physically Abused: No    Sexually Abused: No    FAMILY HISTORY: Family History  Problem Relation Age of Onset   Heart disease  Mother    Emphysema Mother    Leukemia Brother        Chronic lymphocytic leukemia   Colon cancer Neg Hx    Esophageal cancer Neg Hx    Rectal cancer Neg Hx    Stomach cancer Neg Hx     ALLERGIES:  is allergic to daptomycin, rosuvastatin, codeine, morphine and codeine, and omnicef [cefdinir].  MEDICATIONS:  Current Outpatient Medications  Medication Sig Dispense Refill   melatonin 5 MG TABS Take 1 tablet (5 mg total) by mouth at bedtime.     methotrexate (RHEUMATREX) 2.5 MG tablet Take 2.5 mg by mouth once a week.     acetaminophen (TYLENOL) 325 MG tablet Take 2 tablets (650 mg total) by mouth every 6 (six)  hours as needed for mild pain (or Fever >/= 101).     apixaban (ELIQUIS) 2.5 MG TABS tablet Take 2.5 mg by mouth 2 (two) times daily.     bisacodyl 5 MG EC tablet Take 10 mg by mouth daily as needed for moderate constipation.     butalbital-acetaminophen-caffeine (FIORICET) 50-325-40 MG tablet Take 1 tablet by mouth daily as needed for headache. 14 tablet 0   cholecalciferol (VITAMIN D3) 25 MCG (1000 UNIT) tablet Take 1,000 Units by mouth daily.     diclofenac Sodium (VOLTAREN) 1 % GEL Apply 2 g topically 4 (four) times daily as needed (shoulder pain). 2 g 0   docusate sodium (COLACE) 100 MG capsule Take 100 mg by mouth daily.     feeding supplement (ENSURE ENLIVE / ENSURE PLUS) LIQD Take 237 mLs by mouth 3 (three) times daily between meals. 237 mL 12   finasteride (PROSCAR) 5 MG tablet Take 5 mg by mouth daily.     fluorometholone (FML) 0.1 % ophthalmic suspension Place 1 drop into the right eye at bedtime.      folic acid (FOLVITE) 1 MG tablet Take 1 mg by mouth daily.     LORazepam (ATIVAN) 0.5 MG tablet Take 0.5 mg by mouth daily. 0.25 am, 0.25 pm,  0.5 at night     magnesium hydroxide (MILK OF MAGNESIA) 400 MG/5ML suspension Take by mouth daily as needed for mild constipation.     metoprolol succinate (TOPROL XL) 25 MG 24 hr tablet Take 1 tablet (25 mg total) by mouth daily. 90  tablet 3   mirtazapine (REMERON) 15 MG tablet Take 15 mg by mouth at bedtime. Pt taking 30 mg     Multiple Vitamins-Minerals (MULTIVITAMIN PO) Take 1 tablet by mouth daily.      ondansetron (ZOFRAN) 4 MG tablet Take 1 tablet (4 mg total) by mouth every 4 (four) hours as needed for nausea or vomiting. 100 tablet 6   polyethylene glycol (MIRALAX / GLYCOLAX) 17 g packet Take 17 g by mouth 2 (two) times daily.     promethazine (PHENERGAN) 12.5 MG tablet Take 1 tablet (12.5 mg total) by mouth daily as needed for nausea or vomiting. 30 tablet 6   psyllium (REGULOID) 0.52 g capsule Take 0.52 g by mouth daily.     senna-docusate (SENNA-S) 8.6-50 MG tablet Take 1 tablet by mouth daily.     tamsulosin (FLOMAX) 0.4 MG CAPS capsule Take 0.4 mg by mouth at bedtime. PT TAKES 2 CAPSULES AT HS     Tart Cherry 1200 MG CAPS Take 1 tablet by mouth daily.     traMADol (ULTRAM) 50 MG tablet Take 50 mg by mouth every 4 (four) hours as needed for moderate pain or severe pain.     zolpidem (AMBIEN) 10 MG tablet Take 5 mg by mouth at bedtime as needed for sleep.     No current facility-administered medications for this visit.    REVIEW OF SYSTEMS:   Constitutional: ( - ) fevers, ( - )  chills , ( - ) night sweats Eyes: ( - ) blurriness of vision, ( - ) double vision, ( - ) watery eyes Ears, nose, mouth, throat, and face: ( - ) mucositis, ( - ) sore throat Respiratory: ( - ) cough, ( - ) dyspnea, ( - ) wheezes Cardiovascular: ( - ) palpitation, ( - ) chest discomfort, ( - ) lower extremity swelling Gastrointestinal:  ( - ) nausea, ( - ) heartburn, ( - )  change in bowel habits Skin: ( - ) abnormal skin rashes Lymphatics: ( - ) new lymphadenopathy, ( - ) easy bruising Neurological: ( - ) numbness, ( - ) tingling, ( - ) new weaknesses Behavioral/Psych: ( - ) mood change, ( - ) new changes  All other systems were reviewed with the patient and are negative.  PHYSICAL EXAMINATION:  Vitals:   03/08/24 1306  BP:  111/71  Pulse: 76  Resp: 14  Temp: 98 F (36.7 C)  SpO2: 100%     Filed Weights   03/08/24 1306  Weight: 172 lb 11.2 oz (78.3 kg)      GENERAL: well appearing elderly Caucasian male, alert, no distress and comfortable SKIN: skin color, texture, turgor are normal, no rashes or significant lesions EYES: conjunctiva are pink and non-injected, sclera clear LUNGS: clear to auscultation and percussion with normal breathing effort HEART: regular rate & rhythm and no murmurs and no lower extremity edema Musculoskeletal: no cyanosis of digits and no clubbing  PSYCH: alert & oriented x 3, fluent speech NEURO: no focal motor/sensory deficits  LABORATORY DATA:  I have reviewed the data as listed    Latest Ref Rng & Units 02/29/2024   10:07 AM 12/27/2023    4:07 PM 12/06/2023    2:53 PM  CBC  WBC 4.0 - 10.5 K/uL 14.1  16.1  16.5   Hemoglobin 13.0 - 17.0 g/dL 16.1  09.6  04.5   Hematocrit 39.0 - 52.0 % 36.2  37.9  37.7   Platelets 150 - 400 K/uL 282  302.0  329        Latest Ref Rng & Units 02/29/2024   10:07 AM 12/27/2023    4:07 PM 12/06/2023    2:53 PM  CMP  Glucose 70 - 99 mg/dL 409  811  914   BUN 8 - 23 mg/dL 40  44  40   Creatinine 0.61 - 1.24 mg/dL 7.82  9.56  2.13   Sodium 135 - 145 mmol/L 139  136  140   Potassium 3.5 - 5.1 mmol/L 3.9  4.2  4.6   Chloride 98 - 111 mmol/L 104  106  103   CO2 22 - 32 mmol/L 31  27  29    Calcium 8.9 - 10.3 mg/dL 9.5  9.6  08.6   Total Protein 6.5 - 8.1 g/dL 6.9  6.9  7.2   Total Bilirubin 0.0 - 1.2 mg/dL 0.4  0.3  0.4   Alkaline Phos 38 - 126 U/L 77  81  89   AST 15 - 41 U/L 19  18  17    ALT 0 - 44 U/L 27  23  24      Lab Results  Component Value Date   MPROTEIN 0.4 (H) 02/29/2024   MPROTEIN 0.3 (H) 07/05/2023   Lab Results  Component Value Date   KPAFRELGTCHN 59.2 (H) 02/29/2024   KPAFRELGTCHN 84.4 (H) 07/05/2023   LAMBDASER 16.5 02/29/2024   LAMBDASER 24.2 07/05/2023   KAPLAMBRATIO 3.59 (H) 02/29/2024   KAPLAMBRATIO 3.49 (H)  07/05/2023   RADIOGRAPHIC STUDIES: No results found.  ASSESSMENT & PLAN [name] 89 y.o. male with medical history significant for leukocytosis, thrombocytosis, normocytic anemia who presents for a follow up visit.  #Normocytic Anemia-Improving # Thrombocytosis-resolved # Leukocytosis- improving -- labs today show white blood cell 14.1, hemoglobin 12.1, MCV 100.8, platelets 282 -- Leukocytosis of neutrophilic predominant remains persistent, likely secondary to underlying inflammatory disorder and steroid use -- Thrombocytosis has resolved  with IV iron infusion. -- At this time findings appear most consistent with iron deficiency anemia secondary to underlying inflammatory disorder. --MPN panel shows no evidence of a mutation causing his hematological abnormalities. -- Recommend to return to clinic in 3 months for labs and 6 months for clinic visit.  Monoclonal Gammopathy of Undetermined Significance --labs showed an IgM kappa monoclonal protein, measured at 0.4 --recommend a metastatic bone survey to assess for lytic lesions --will need for a bone marrow biopsy due to having IgM specificity.  --RTC as above    No orders of the defined types were placed in this encounter.   All questions were answered. The patient knows to call the clinic with any problems, questions or concerns.  A total of more than 30 minutes were spent on this encounter with face-to-face time and non-face-to-face time, including preparing to see the patient, ordering tests and/or medications, counseling the patient and coordination of care as outlined above.   Rogerio Clay, MD Department of Hematology/Oncology [facility] Phone: [phone] Pager: [phone] Email: [email or web address]  03/10/2024 7:10 PM

## 2024-03-08 ENCOUNTER — Other Ambulatory Visit

## 2024-03-08 ENCOUNTER — Inpatient Hospital Stay: Admitting: Hematology and Oncology

## 2024-03-08 VITALS — BP 111/71 | HR 76 | Temp 98.0°F | Resp 14 | Wt 172.7 lb

## 2024-03-08 DIAGNOSIS — M353 Polymyalgia rheumatica: Secondary | ICD-10-CM

## 2024-03-08 DIAGNOSIS — D472 Monoclonal gammopathy: Secondary | ICD-10-CM

## 2024-03-08 DIAGNOSIS — D649 Anemia, unspecified: Secondary | ICD-10-CM | POA: Diagnosis not present

## 2024-03-08 DIAGNOSIS — D509 Iron deficiency anemia, unspecified: Secondary | ICD-10-CM

## 2024-03-08 DIAGNOSIS — Z806 Family history of leukemia: Secondary | ICD-10-CM | POA: Diagnosis not present

## 2024-03-08 DIAGNOSIS — D72829 Elevated white blood cell count, unspecified: Secondary | ICD-10-CM | POA: Diagnosis not present

## 2024-03-08 MED ORDER — MELATONIN 5 MG PO TABS: 5.0000 mg | ORAL_TABLET | Freq: Every day | ORAL | Status: DC

## 2024-03-10 ENCOUNTER — Encounter: Payer: Self-pay | Admitting: Physician Assistant

## 2024-03-11 DIAGNOSIS — M545 Low back pain, unspecified: Secondary | ICD-10-CM | POA: Diagnosis not present

## 2024-03-11 DIAGNOSIS — R269 Unspecified abnormalities of gait and mobility: Secondary | ICD-10-CM | POA: Diagnosis not present

## 2024-03-11 DIAGNOSIS — R5382 Chronic fatigue, unspecified: Secondary | ICD-10-CM | POA: Diagnosis not present

## 2024-03-11 DIAGNOSIS — M79605 Pain in left leg: Secondary | ICD-10-CM | POA: Diagnosis not present

## 2024-03-18 ENCOUNTER — Other Ambulatory Visit: Payer: Self-pay | Admitting: Cardiology

## 2024-03-18 DIAGNOSIS — R5382 Chronic fatigue, unspecified: Secondary | ICD-10-CM | POA: Diagnosis not present

## 2024-03-18 DIAGNOSIS — R269 Unspecified abnormalities of gait and mobility: Secondary | ICD-10-CM | POA: Diagnosis not present

## 2024-03-18 DIAGNOSIS — M545 Low back pain, unspecified: Secondary | ICD-10-CM | POA: Diagnosis not present

## 2024-03-18 DIAGNOSIS — M79605 Pain in left leg: Secondary | ICD-10-CM | POA: Diagnosis not present

## 2024-03-20 DIAGNOSIS — M545 Low back pain, unspecified: Secondary | ICD-10-CM | POA: Diagnosis not present

## 2024-03-20 DIAGNOSIS — R5382 Chronic fatigue, unspecified: Secondary | ICD-10-CM | POA: Diagnosis not present

## 2024-03-20 DIAGNOSIS — R269 Unspecified abnormalities of gait and mobility: Secondary | ICD-10-CM | POA: Diagnosis not present

## 2024-03-20 DIAGNOSIS — M79605 Pain in left leg: Secondary | ICD-10-CM | POA: Diagnosis not present

## 2024-03-21 ENCOUNTER — Other Ambulatory Visit: Payer: Self-pay | Admitting: Cardiology

## 2024-03-27 DIAGNOSIS — M79605 Pain in left leg: Secondary | ICD-10-CM | POA: Diagnosis not present

## 2024-03-27 DIAGNOSIS — M545 Low back pain, unspecified: Secondary | ICD-10-CM | POA: Diagnosis not present

## 2024-03-27 DIAGNOSIS — R5382 Chronic fatigue, unspecified: Secondary | ICD-10-CM | POA: Diagnosis not present

## 2024-03-27 DIAGNOSIS — R269 Unspecified abnormalities of gait and mobility: Secondary | ICD-10-CM | POA: Diagnosis not present

## 2024-03-29 DIAGNOSIS — R5383 Other fatigue: Secondary | ICD-10-CM | POA: Diagnosis not present

## 2024-03-29 DIAGNOSIS — Z1389 Encounter for screening for other disorder: Secondary | ICD-10-CM | POA: Diagnosis not present

## 2024-03-29 DIAGNOSIS — E785 Hyperlipidemia, unspecified: Secondary | ICD-10-CM | POA: Diagnosis not present

## 2024-03-29 DIAGNOSIS — R7989 Other specified abnormal findings of blood chemistry: Secondary | ICD-10-CM | POA: Diagnosis not present

## 2024-04-01 DIAGNOSIS — M545 Low back pain, unspecified: Secondary | ICD-10-CM | POA: Diagnosis not present

## 2024-04-01 DIAGNOSIS — M79605 Pain in left leg: Secondary | ICD-10-CM | POA: Diagnosis not present

## 2024-04-01 DIAGNOSIS — R5382 Chronic fatigue, unspecified: Secondary | ICD-10-CM | POA: Diagnosis not present

## 2024-04-01 DIAGNOSIS — R269 Unspecified abnormalities of gait and mobility: Secondary | ICD-10-CM | POA: Diagnosis not present

## 2024-04-04 DIAGNOSIS — M545 Low back pain, unspecified: Secondary | ICD-10-CM | POA: Diagnosis not present

## 2024-04-04 DIAGNOSIS — R269 Unspecified abnormalities of gait and mobility: Secondary | ICD-10-CM | POA: Diagnosis not present

## 2024-04-04 DIAGNOSIS — M79605 Pain in left leg: Secondary | ICD-10-CM | POA: Diagnosis not present

## 2024-04-04 DIAGNOSIS — R5382 Chronic fatigue, unspecified: Secondary | ICD-10-CM | POA: Diagnosis not present

## 2024-04-08 ENCOUNTER — Other Ambulatory Visit: Payer: Self-pay | Admitting: Radiology

## 2024-04-08 DIAGNOSIS — D472 Monoclonal gammopathy: Secondary | ICD-10-CM

## 2024-04-08 DIAGNOSIS — R269 Unspecified abnormalities of gait and mobility: Secondary | ICD-10-CM | POA: Diagnosis not present

## 2024-04-08 DIAGNOSIS — R5382 Chronic fatigue, unspecified: Secondary | ICD-10-CM | POA: Diagnosis not present

## 2024-04-08 DIAGNOSIS — M79605 Pain in left leg: Secondary | ICD-10-CM | POA: Diagnosis not present

## 2024-04-08 DIAGNOSIS — M545 Low back pain, unspecified: Secondary | ICD-10-CM | POA: Diagnosis not present

## 2024-04-08 NOTE — H&P (Signed)
 Chief Complaint: Monoclonal gammopathy of unknown significance; referred  for CT-guided bone marrow biopsy for further evaluation  Referring Provider(s): Dorsey,J  Supervising Physician: Erica Hau  Patient Status: [facility] - Out-pt  History of Present Illness: [name] is a 89 y.o. male with past medical history significant for AAA with stent graft, colon polyps, degenerative disc disease, hearing loss, hyperlipidemia, interstitial lung disease, PMR, as well as leukocytosis, thrombocytosis, normocytic anemia and MGUS.  He presents today for CT-guided bone marrow biopsy for further evaluation.  He is known to IR team from endovascular endoleak repair with liquid embolic embolization in 2022.   *** Patient is Full Code  Past Medical History:  Diagnosis Date   AAA (abdominal aortic aneurysm) (HCC)    Abnormal EKG    Left atrial abnormality   Allergic rhinitis    Allergy    Benign neoplasm of colon 04/14/2010   3 small polyps on colonoscopy by Dr. Elvin Hammer   Carpal tunnel syndrome    Cataract    Decubitus ulcer of coccygeal region, stage 2 (HCC) 07/06/2023   Degenerative disc disease    Disturbances metabolism of methionine, homocystine, and cystathionine (HCC)    Elevated homocysteine   Elevated prostate specific antigen (PSA)    Elevated PSA 07/06/2023   Hearing loss    Hyperlipidemia    ILD (interstitial lung disease) (HCC) 07/06/2023   Impotence of organic origin    Penile implant   Internal hemorrhoids    Leukocytosis 07/06/2023   Neck pain    Otosclerosis of both ears    Peripheral vascular disease (HCC)    Bilateral femoral bruit   Plantar fasciitis    Polymyalgia rheumatica (HCC)    Polymyalgia rheumatica (HCC) 07/06/2023   Rotator cuff syndrome of left shoulder    Scoliosis    Shoulder pain     Past Surgical History:  Procedure Laterality Date   ABDOMINAL AORTIC ENDOVASCULAR STENT GRAFT N/A 08/16/2017   Procedure: ABDOMINAL AORTIC ENDOVASCULAR STENT  GRAFT insertion;  Surgeon: Margherita Shell, MD;  Location: [facility] OR;  Service: Vascular;  Laterality: N/A;   Actinic keratosis removal  06/21/2010   Left shoulder; Dr. 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   Procedure: DUPUYTREN CONTRACTURE RELEASE RIGHT LONG, RING AND SMALL FINGERS;  Surgeon: Amelie Baize., MD;  Location: [facility];  Service: Orthopedics;  Laterality: Right;   Implant penile pump  2001   IR ANGIOGRAM PELVIS SELECTIVE OR SUPRASELECTIVE  12/11/2020   IR ANGIOGRAM SELECTIVE EACH ADDITIONAL VESSEL  12/11/2020   IR EMBO ARTERIAL NOT HEMORR HEMANG INC GUIDE ROADMAPPING  12/11/2020   IR RADIOLOGIST EVAL & MGMT  07/30/2020   IR RADIOLOGIST EVAL & MGMT  08/16/2023   IR US  GUIDE VASC ACCESS RIGHT  12/11/2020   Resection of appendix and tip of rectum  February 2005   STAPEDECTOMY Bilateral 1985, 1988   [employer]    Allergies: Daptomycin , Rosuvastatin , Codeine, Morphine  and codeine, and Omnicef [cefdinir]  Medications: Prior to Admission medications   Medication Sig Start Date End Date Taking? Authorizing Provider  acetaminophen  (TYLENOL ) 325 MG tablet Take 2 tablets (650 mg total) by mouth every 6 (six) hours as needed for mild pain (or Fever >/= 101). 03/23/23   Armenta Landau, MD  apixaban  (ELIQUIS ) 2.5 MG TABS tablet Take 2.5 mg by mouth 2 (two) times daily. 05/09/23   [provider]  bisacodyl 5 MG EC tablet Take 10 mg by mouth daily as needed for moderate constipation.    [provider]  butalbital -acetaminophen -caffeine  (FIORICET ) 50-325-40 MG tablet Take 1 tablet by mouth daily as needed for headache. 03/23/23   Armenta Landau, MD  cholecalciferol (VITAMIN D3) 25 MCG (1000 UNIT) tablet Take 1,000 Units by mouth daily.    [provider]  diclofenac  Sodium (VOLTAREN ) 1 % GEL Apply 2 g topically 4 (four) times daily as needed (shoulder pain). 03/23/23    Armenta Landau, MD  docusate sodium  (COLACE) 100 MG capsule Take 100 mg by mouth daily.    [provider]  feeding supplement (ENSURE ENLIVE / ENSURE PLUS) LIQD Take 237 mLs by mouth 3 (three) times daily between meals. 03/23/23   Armenta Landau, MD  finasteride (PROSCAR) 5 MG tablet Take 5 mg by mouth daily. 05/17/23   [provider]  fluorometholone  (FML) 0.1 % ophthalmic suspension Place 1 drop into the right eye at bedtime.     [provider]  folic acid  (FOLVITE ) 1 MG tablet Take 1 mg by mouth daily.    [provider]  LORazepam  (ATIVAN ) 0.5 MG tablet Take 0.5 mg by mouth daily. 0.25 am, 0.25 pm,  0.5 at night 04/01/23   [provider]  magnesium  hydroxide (MILK OF MAGNESIA) 400 MG/5ML suspension Take by mouth daily as needed for mild constipation.    [provider]  melatonin 5 MG TABS Take 1 tablet (5 mg total) by mouth at bedtime. 03/08/24   Dorsey, John T IV, MD  methotrexate (RHEUMATREX) 2.5 MG tablet Take 2.5 mg by mouth once a week. 02/15/24 08/13/24  [provider]  metoprolol  succinate (TOPROL -XL) 25 MG 24 hr tablet TAKE ONE TABLET BY MOUTH DAILY 03/21/24   Wendie Hamburg, MD  mirtazapine (REMERON) 15 MG tablet Take 15 mg by mouth at bedtime. Pt taking 30 mg    [provider]  Multiple Vitamins-Minerals (MULTIVITAMIN PO) Take 1 tablet by mouth daily.     [provider]  ondansetron  (ZOFRAN ) 4 MG tablet Take 1 tablet (4 mg total) by mouth every 4 (four) hours as needed for nausea or vomiting. 08/11/23   Tobin Forts, MD  polyethylene glycol (MIRALAX / GLYCOLAX) 17 g packet Take 17 g by mouth 2 (two) times daily.    [provider]  promethazine  (PHENERGAN ) 12.5 MG tablet Take 1 tablet (12.5 mg total) by mouth daily as needed for nausea or vomiting. 08/11/23   Tobin Forts, MD  psyllium (REGULOID) 0.52 g capsule Take 0.52 g by mouth daily.    [provider]   senna-docusate (SENNA-S) 8.6-50 MG tablet Take 1 tablet by mouth daily.    [provider]  tamsulosin  (FLOMAX ) 0.4 MG CAPS capsule Take 0.4 mg by mouth at bedtime. PT TAKES 2 CAPSULES AT HS 06/10/20   [provider]  Tart Cherry 1200 MG CAPS Take 1 tablet by mouth daily.    [provider]  traMADol  (ULTRAM ) 50 MG tablet Take 50 mg by mouth every 4 (four) hours as needed for moderate pain or severe pain.    [provider]  zolpidem (AMBIEN) 10 MG tablet Take 5 mg by mouth at bedtime as needed for sleep.    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   Social Drivers of Corporate investment banker Strain: Low Risk  (03/07/2023)   Overall Financial Resource Strain (CARDIA)    Difficulty of Paying Living Expenses: Not hard at all  Food Insecurity: No Food Insecurity (03/15/2023)   Hunger Vital Sign    Worried About Running Out of Food in the Last Year: Never true    Ran Out of Food in the Last Year: Never  true  Transportation Needs: No Transportation Needs (03/15/2023)   PRAPARE - Administrator, Civil Service (Medical): No    Lack of Transportation (Non-Medical): No  Physical Activity: Not on file  Stress: Not on file  Social Connections: Not on file       Review of Systems  Vital Signs:   Advance Care Plan: No documents on file  Physical Exam  Imaging: No results found.  Labs:  CBC: Recent Labs    10/11/23 1259 12/06/23 1453 12/27/23 1607 02/29/24 1007  WBC 14.5* 16.5* 16.1* 14.1*  HGB 12.6* 13.1 12.4* 12.1*  HCT 38.8* 37.7* 37.9* 36.2*  PLT 307 329 302.0 282    COAGS: No results for input(s): "INR", "APTT" in the last 8760 hours.  BMP: Recent Labs    08/31/23 1419 10/11/23 1259 12/06/23 1453 12/27/23 1607 02/29/24 1007  NA 139 139 140 136 139  K 4.9 4.4 4.6 4.2 3.9  CL 103 103 103 106 104  CO2 30 31 29 27 31   GLUCOSE 155* 111* 123* 130* 141*  BUN 47* 38* 40* 44* 40*  CALCIUM  10.2 10.0 10.1 9.6 9.5  CREATININE 0.94 0.97 1.13 1.01 1.04  GFRNONAA >60 >60 >60  --  >60    LIVER FUNCTION TESTS: Recent Labs    10/11/23 1259 12/06/23 1453 12/27/23 1607 02/29/24 1007  BILITOT 0.3 0.4 0.3 0.4  AST 18 17 18 19   ALT 16 24 23 27   ALKPHOS 80 89 81 77  PROT 6.9 7.2 6.9 6.9  ALBUMIN  3.6 3.8 3.8 3.7    TUMOR MARKERS: No results for input(s): "AFPTM", "CEA", "CA199", "CHROMGRNA" in the last 8760 hours.  Assessment and Plan: 89 y.o. male with past medical history significant for AAA with stent graft, colon polyps, degenerative disc disease, hearing loss, hyperlipidemia, interstitial lung disease, PMR, as well as leukocytosis, thrombocytosis, normocytic anemia and MGUS.  He presents today for CT-guided bone marrow biopsy for further evaluation.  He is known to IR team from endovascular endoleak repair with liquid embolic embolization in  2022.Risks and benefits of procedure was discussed with the patient and/or patient's family including, but  not limited to bleeding, infection, damage to adjacent structures or low yield requiring additional tests.  All of the questions were answered and there is agreement to proceed.  Consent signed and in chart.    Thank you for allowing our service to participate in [name] 's care.  Electronically Signed: D. Honore Lux, PA-C   04/08/2024, 4:43 PM      I spent a total of    15 Minutes in face to face in clinical consultation, greater than 50% of which was counseling/coordinating care for CT-guided bone marrow biopsy

## 2024-04-09 ENCOUNTER — Ambulatory Visit (HOSPITAL_COMMUNITY)
Admission: RE | Admit: 2024-04-09 | Discharge: 2024-04-09 | Disposition: A | Discharge status: Home / Self Care | Source: Ambulatory Visit | Attending: Hematology and Oncology

## 2024-04-09 ENCOUNTER — Ambulatory Visit (HOSPITAL_COMMUNITY)
Admission: RE | Admit: 2024-04-09 | Discharge: 2024-04-09 | Disposition: A | Discharge status: Home / Self Care | Source: Ambulatory Visit | Attending: Hematology and Oncology | Admitting: Hematology and Oncology

## 2024-04-09 DIAGNOSIS — Z79899 Other long term (current) drug therapy: Secondary | ICD-10-CM | POA: Insufficient documentation

## 2024-04-09 DIAGNOSIS — M353 Polymyalgia rheumatica: Secondary | ICD-10-CM | POA: Diagnosis not present

## 2024-04-09 DIAGNOSIS — H9190 Unspecified hearing loss, unspecified ear: Secondary | ICD-10-CM | POA: Insufficient documentation

## 2024-04-09 DIAGNOSIS — D472 Monoclonal gammopathy: Secondary | ICD-10-CM | POA: Diagnosis not present

## 2024-04-09 DIAGNOSIS — Z1379 Encounter for other screening for genetic and chromosomal anomalies: Secondary | ICD-10-CM | POA: Insufficient documentation

## 2024-04-09 DIAGNOSIS — D75839 Thrombocytosis, unspecified: Secondary | ICD-10-CM | POA: Diagnosis not present

## 2024-04-09 DIAGNOSIS — Z860100 Personal history of colon polyps, unspecified: Secondary | ICD-10-CM | POA: Insufficient documentation

## 2024-04-09 DIAGNOSIS — E785 Hyperlipidemia, unspecified: Secondary | ICD-10-CM | POA: Insufficient documentation

## 2024-04-09 DIAGNOSIS — J849 Interstitial pulmonary disease, unspecified: Secondary | ICD-10-CM | POA: Diagnosis not present

## 2024-04-09 DIAGNOSIS — D72829 Elevated white blood cell count, unspecified: Secondary | ICD-10-CM | POA: Insufficient documentation

## 2024-04-09 DIAGNOSIS — D649 Anemia, unspecified: Secondary | ICD-10-CM | POA: Diagnosis not present

## 2024-04-09 DIAGNOSIS — Z7901 Long term (current) use of anticoagulants: Secondary | ICD-10-CM | POA: Diagnosis not present

## 2024-04-09 DIAGNOSIS — Z95828 Presence of other vascular implants and grafts: Secondary | ICD-10-CM | POA: Diagnosis not present

## 2024-04-09 DIAGNOSIS — D720 Genetic anomalies of leukocytes: Secondary | ICD-10-CM | POA: Diagnosis not present

## 2024-04-09 DIAGNOSIS — D539 Nutritional anemia, unspecified: Secondary | ICD-10-CM | POA: Diagnosis not present

## 2024-04-09 LAB — CBC WITH DIFFERENTIAL/PLATELET
Abs Immature Granulocytes: 0.15 10*3/uL — ABNORMAL HIGH (ref 0.00–0.07)
Basophils Absolute: 0.1 10*3/uL (ref 0.0–0.1)
Basophils Relative: 0 %
Eosinophils Absolute: 0.2 10*3/uL (ref 0.0–0.5)
Eosinophils Relative: 1 %
HCT: 36.7 % — ABNORMAL LOW (ref 39.0–52.0)
Hemoglobin: 12.1 g/dL — ABNORMAL LOW (ref 13.0–17.0)
Immature Granulocytes: 1 %
Lymphocytes Relative: 16 %
Lymphs Abs: 2.6 10*3/uL (ref 0.7–4.0)
MCH: 34.6 pg — ABNORMAL HIGH (ref 26.0–34.0)
MCHC: 33 g/dL (ref 30.0–36.0)
MCV: 104.9 fL — ABNORMAL HIGH (ref 80.0–100.0)
Monocytes Absolute: 1 10*3/uL (ref 0.1–1.0)
Monocytes Relative: 7 %
Neutro Abs: 11.8 10*3/uL — ABNORMAL HIGH (ref 1.7–7.7)
Neutrophils Relative %: 75 %
Platelets: 307 10*3/uL (ref 150–400)
RBC: 3.5 MIL/uL — ABNORMAL LOW (ref 4.22–5.81)
RDW: 15.4 % (ref 11.5–15.5)
WBC: 15.9 10*3/uL — ABNORMAL HIGH (ref 4.0–10.5)
nRBC: 0 % (ref 0.0–0.2)

## 2024-04-09 MED ORDER — NALOXONE HCL 0.4 MG/ML IJ SOLN
INTRAMUSCULAR | Status: AC
Start: 2024-04-09 — End: ?
  Filled 2024-04-09: qty 1

## 2024-04-09 MED ORDER — MIDAZOLAM HCL 2 MG/2ML IJ SOLN
INTRAMUSCULAR | Status: AC | PRN
  Administered 2024-04-09: 1 mg via INTRAVENOUS

## 2024-04-09 MED ORDER — FENTANYL CITRATE (PF) 100 MCG/2ML IJ SOLN
INTRAMUSCULAR | Status: AC
Start: 2024-04-09 — End: ?
  Filled 2024-04-09: qty 4

## 2024-04-09 MED ORDER — SODIUM CHLORIDE 0.9 % IV SOLN: INTRAVENOUS | Status: DC

## 2024-04-09 MED ORDER — FLUMAZENIL 0.5 MG/5ML IV SOLN
INTRAVENOUS | Status: AC
  Filled 2024-04-09: qty 5

## 2024-04-09 MED ORDER — FENTANYL CITRATE (PF) 100 MCG/2ML IJ SOLN
INTRAMUSCULAR | Status: AC | PRN
  Administered 2024-04-09: 50 ug via INTRAVENOUS

## 2024-04-09 MED ORDER — MIDAZOLAM HCL 2 MG/2ML IJ SOLN
INTRAMUSCULAR | Status: AC
  Filled 2024-04-09: qty 2

## 2024-04-09 MED ORDER — LIDOCAINE HCL 1 % IJ SOLN
INTRAMUSCULAR | Status: AC | PRN
  Administered 2024-04-09: 10 mL via INTRADERMAL

## 2024-04-09 NOTE — Procedures (Signed)
 Interventional Radiology Procedure Note  Procedure: CT guided bone marrow aspiration and biopsy  Complications: None  EBL: < 10 mL  Findings: Aspirate and core biopsy performed of bone marrow in right iliac bone.  Plan: Bedrest supine x 1 hrs  Ericha Whittingham T. Fredia Sorrow, M.D Pager:  [phone]

## 2024-04-09 NOTE — Discharge Instructions (Signed)
 Bone Marrow Aspiration and Bone Marrow Biopsy, Adult, Care After  The following information offers guidance on how to care for yourself after your procedure. Your health care provider may also give you more specific instructions. If you have problems or questions, contact your health care provider.  What can I expect after the procedure?  May remove dressing or bandaid and shower tomorrow.  Keep site clean and dry. Replace with clean dressing or bandaid as necessary. Urgent needs IR clinic [phone] (mon-fri 8-5).  After the procedure, it is common to have: Mild pain and tenderness. Swelling. Bruising. Follow these instructions at home: Incision care  Follow instructions from your health care provider about how to take care of the incision site. Make sure you: Wash your hands with soap and water for at least 20 seconds before and after you change your bandage (dressing). If soap and water are not available, use hand sanitizer. Change your dressing as told by your health care provider. Leave stitches (sutures), skin glue, or adhesive strips in place. These skin closures may need to stay in place for 2 weeks or longer. If adhesive strip edges start to loosen and curl up, you may trim the loose edges. Do not remove adhesive strips completely unless your health care provider tells you to do that. Check your incision site every day for signs of infection. Check for: More redness, swelling, or pain. Fluid or blood. Warmth. Pus or a bad smell. Activity Return to your normal activities as told by your health care provider. Ask your health care provider what activities are safe for you. Do not lift anything that is heavier than 10 lb (4.5 kg), or the limit that you are told, until your health care provider says that it is safe. If you were given a sedative during the procedure, it can affect you for several hours. Do not drive or operate machinery until your health care provider says that it is  safe. General instructions  Take over-the-counter and prescription medicines only as told by your health care provider. Do not take baths, swim, or use a hot tub until your health care provider approves. Ask your health care provider if you may take showers. You may only be allowed to take sponge baths. If directed, put ice on the affected area. To do this: Put ice in a plastic bag. Place a towel between your skin and the bag. Leave the ice on for 20 minutes, 2-3 times a day. If your skin turns bright red, remove the ice right away to prevent skin damage. The risk of skin damage is higher if you cannot feel pain, heat, or cold. Contact a health care provider if: You have signs of infection. Your pain is not controlled with medicine. You have cancer, and a temperature of 100.22F (38C) or higher. Get help right away if: You have a temperature of 101F (38.3C) or higher, or as told by your health care provider. You have bleeding from the incision site that cannot be controlled. This information is not intended to replace advice given to you by your health care provider. Make sure you discuss any questions you have with your health care provider. Document Revised: 03/21/2022 Document Reviewed: 03/21/2022 Elsevier Patient Education  2023 Elsevier Inc.     Moderate Conscious Sedation, Adult, Care After  This sheet gives you information about how to care for yourself after your procedure. Your health care provider may also give you more specific instructions. If you have problems or questions, contact  your health care provider. What can I expect after the procedure? After the procedure, it is common to have: Sleepiness for several hours. Impaired judgment for several hours. Difficulty with balance. Vomiting if you eat too soon. Follow these instructions at home: For the time period you were told by your health care provider:     Rest. Do not participate in activities where you  could fall or become injured. Do not drive or use machinery. Do not drink alcohol. Do not take sleeping pills or medicines that cause drowsiness. Do not make important decisions or sign legal documents. Do not take care of children on your own. Eating and drinking  Follow the diet recommended by your health care provider. Drink enough fluid to keep your urine pale yellow. If you vomit: Drink water, juice, or soup when you can drink without vomiting. Make sure you have little or no nausea before eating solid foods. General instructions Take over-the-counter and prescription medicines only as told by your health care provider. Have a responsible adult stay with you for the time you are told. It is important to have someone help care for you until you are awake and alert. Do not smoke. Keep all follow-up visits as told by your health care provider. This is important. Contact a health care provider if: You are still sleepy or having trouble with balance after 24 hours. You feel light-headed. You keep feeling nauseous or you keep vomiting. You develop a rash. You have a fever. You have redness or swelling around the IV site. Get help right away if: You have trouble breathing. You have new-onset confusion at home. Summary After the procedure, it is common to feel sleepy, have impaired judgment, or feel nauseous if you eat too soon. Rest after you get home. Know the things you should not do after the procedure. Follow the diet recommended by your health care provider and drink enough fluid to keep your urine pale yellow. Get help right away if you have trouble breathing or new-onset confusion at home. This information is not intended to replace advice given to you by your health care provider. Make sure you discuss any questions you have with your health care provider. Document Revised: 03/13/2020 Document Reviewed: 10/10/2019 Elsevier Patient Education  2023 [employer].

## 2024-04-10 DIAGNOSIS — R269 Unspecified abnormalities of gait and mobility: Secondary | ICD-10-CM | POA: Diagnosis not present

## 2024-04-10 DIAGNOSIS — M79605 Pain in left leg: Secondary | ICD-10-CM | POA: Diagnosis not present

## 2024-04-10 DIAGNOSIS — R5382 Chronic fatigue, unspecified: Secondary | ICD-10-CM | POA: Diagnosis not present

## 2024-04-10 DIAGNOSIS — M545 Low back pain, unspecified: Secondary | ICD-10-CM | POA: Diagnosis not present

## 2024-04-11 DIAGNOSIS — L89151 Pressure ulcer of sacral region, stage 1: Secondary | ICD-10-CM | POA: Diagnosis not present

## 2024-04-11 DIAGNOSIS — D72829 Elevated white blood cell count, unspecified: Secondary | ICD-10-CM | POA: Diagnosis not present

## 2024-04-11 DIAGNOSIS — R110 Nausea: Secondary | ICD-10-CM | POA: Diagnosis not present

## 2024-04-11 DIAGNOSIS — D638 Anemia in other chronic diseases classified elsewhere: Secondary | ICD-10-CM | POA: Diagnosis not present

## 2024-04-11 DIAGNOSIS — J9601 Acute respiratory failure with hypoxia: Secondary | ICD-10-CM | POA: Diagnosis not present

## 2024-04-11 DIAGNOSIS — R5382 Chronic fatigue, unspecified: Secondary | ICD-10-CM | POA: Diagnosis not present

## 2024-04-11 DIAGNOSIS — D472 Monoclonal gammopathy: Secondary | ICD-10-CM | POA: Diagnosis not present

## 2024-04-11 DIAGNOSIS — M67911 Unspecified disorder of synovium and tendon, right shoulder: Secondary | ICD-10-CM | POA: Diagnosis not present

## 2024-04-11 DIAGNOSIS — F0390 Unspecified dementia without behavioral disturbance: Secondary | ICD-10-CM | POA: Diagnosis not present

## 2024-04-11 DIAGNOSIS — F32A Depression, unspecified: Secondary | ICD-10-CM | POA: Diagnosis not present

## 2024-04-11 DIAGNOSIS — I480 Paroxysmal atrial fibrillation: Secondary | ICD-10-CM | POA: Diagnosis not present

## 2024-04-11 DIAGNOSIS — M353 Polymyalgia rheumatica: Secondary | ICD-10-CM | POA: Diagnosis not present

## 2024-04-11 DIAGNOSIS — E2740 Unspecified adrenocortical insufficiency: Secondary | ICD-10-CM | POA: Diagnosis not present

## 2024-04-11 LAB — SURGICAL PATHOLOGY

## 2024-04-15 ENCOUNTER — Inpatient Hospital Stay (HOSPITAL_BASED_OUTPATIENT_CLINIC_OR_DEPARTMENT_OTHER)
Admission: EM | Admit: 2024-04-15 | Discharge: 2024-04-18 | DRG: 603 | Disposition: A | Discharge status: Home / Self Care | Attending: Internal Medicine | Admitting: Internal Medicine

## 2024-04-15 ENCOUNTER — Emergency Department (HOSPITAL_BASED_OUTPATIENT_CLINIC_OR_DEPARTMENT_OTHER)

## 2024-04-15 ENCOUNTER — Other Ambulatory Visit: Payer: Self-pay

## 2024-04-15 DIAGNOSIS — F419 Anxiety disorder, unspecified: Secondary | ICD-10-CM | POA: Diagnosis not present

## 2024-04-15 DIAGNOSIS — Z86711 Personal history of pulmonary embolism: Secondary | ICD-10-CM

## 2024-04-15 DIAGNOSIS — J849 Interstitial pulmonary disease, unspecified: Secondary | ICD-10-CM | POA: Diagnosis present

## 2024-04-15 DIAGNOSIS — I959 Hypotension, unspecified: Secondary | ICD-10-CM | POA: Diagnosis not present

## 2024-04-15 DIAGNOSIS — Z7901 Long term (current) use of anticoagulants: Secondary | ICD-10-CM | POA: Diagnosis not present

## 2024-04-15 DIAGNOSIS — L03314 Cellulitis of groin: Principal | ICD-10-CM | POA: Diagnosis present

## 2024-04-15 DIAGNOSIS — Z960 Presence of urogenital implants: Secondary | ICD-10-CM | POA: Diagnosis not present

## 2024-04-15 DIAGNOSIS — Z888 Allergy status to other drugs, medicaments and biological substances status: Secondary | ICD-10-CM | POA: Diagnosis not present

## 2024-04-15 DIAGNOSIS — N281 Cyst of kidney, acquired: Secondary | ICD-10-CM | POA: Diagnosis not present

## 2024-04-15 DIAGNOSIS — Z86718 Personal history of other venous thrombosis and embolism: Secondary | ICD-10-CM

## 2024-04-15 DIAGNOSIS — R102 Pelvic and perineal pain: Secondary | ICD-10-CM | POA: Diagnosis not present

## 2024-04-15 DIAGNOSIS — N400 Enlarged prostate without lower urinary tract symptoms: Secondary | ICD-10-CM | POA: Diagnosis present

## 2024-04-15 DIAGNOSIS — Z8249 Family history of ischemic heart disease and other diseases of the circulatory system: Secondary | ICD-10-CM

## 2024-04-15 DIAGNOSIS — Z79899 Other long term (current) drug therapy: Secondary | ICD-10-CM | POA: Diagnosis not present

## 2024-04-15 DIAGNOSIS — M353 Polymyalgia rheumatica: Secondary | ICD-10-CM | POA: Diagnosis not present

## 2024-04-15 DIAGNOSIS — L03115 Cellulitis of right lower limb: Secondary | ICD-10-CM

## 2024-04-15 DIAGNOSIS — G4700 Insomnia, unspecified: Secondary | ICD-10-CM | POA: Diagnosis present

## 2024-04-15 DIAGNOSIS — J9611 Chronic respiratory failure with hypoxia: Secondary | ICD-10-CM | POA: Diagnosis present

## 2024-04-15 DIAGNOSIS — Z7952 Long term (current) use of systemic steroids: Secondary | ICD-10-CM

## 2024-04-15 DIAGNOSIS — D472 Monoclonal gammopathy: Secondary | ICD-10-CM | POA: Diagnosis not present

## 2024-04-15 DIAGNOSIS — I7143 Infrarenal abdominal aortic aneurysm, without rupture: Secondary | ICD-10-CM | POA: Diagnosis not present

## 2024-04-15 DIAGNOSIS — Z66 Do not resuscitate: Secondary | ICD-10-CM | POA: Diagnosis present

## 2024-04-15 DIAGNOSIS — I2699 Other pulmonary embolism without acute cor pulmonale: Secondary | ICD-10-CM | POA: Diagnosis not present

## 2024-04-15 DIAGNOSIS — I480 Paroxysmal atrial fibrillation: Secondary | ICD-10-CM | POA: Diagnosis not present

## 2024-04-15 DIAGNOSIS — I5032 Chronic diastolic (congestive) heart failure: Secondary | ICD-10-CM | POA: Diagnosis not present

## 2024-04-15 DIAGNOSIS — Z885 Allergy status to narcotic agent status: Secondary | ICD-10-CM | POA: Diagnosis not present

## 2024-04-15 DIAGNOSIS — Z9889 Other specified postprocedural states: Secondary | ICD-10-CM

## 2024-04-15 DIAGNOSIS — E785 Hyperlipidemia, unspecified: Secondary | ICD-10-CM | POA: Diagnosis present

## 2024-04-15 DIAGNOSIS — Z881 Allergy status to other antibiotic agents status: Secondary | ICD-10-CM | POA: Diagnosis not present

## 2024-04-15 DIAGNOSIS — Z8679 Personal history of other diseases of the circulatory system: Secondary | ICD-10-CM

## 2024-04-15 DIAGNOSIS — E2740 Unspecified adrenocortical insufficiency: Secondary | ICD-10-CM | POA: Diagnosis present

## 2024-04-15 LAB — CBC WITH DIFFERENTIAL/PLATELET
Abs Immature Granulocytes: 0.19 10*3/uL — ABNORMAL HIGH (ref 0.00–0.07)
Basophils Absolute: 0 10*3/uL (ref 0.0–0.1)
Basophils Relative: 0 %
Eosinophils Absolute: 0 10*3/uL (ref 0.0–0.5)
Eosinophils Relative: 0 %
HCT: 37.2 % — ABNORMAL LOW (ref 39.0–52.0)
Hemoglobin: 12.3 g/dL — ABNORMAL LOW (ref 13.0–17.0)
Immature Granulocytes: 1 %
Lymphocytes Relative: 4 %
Lymphs Abs: 1 10*3/uL (ref 0.7–4.0)
MCH: 34 pg (ref 26.0–34.0)
MCHC: 33.1 g/dL (ref 30.0–36.0)
MCV: 102.8 fL — ABNORMAL HIGH (ref 80.0–100.0)
Monocytes Absolute: 1 10*3/uL (ref 0.1–1.0)
Monocytes Relative: 5 %
Neutro Abs: 19.5 10*3/uL — ABNORMAL HIGH (ref 1.7–7.7)
Neutrophils Relative %: 90 %
Platelets: 323 10*3/uL (ref 150–400)
RBC: 3.62 MIL/uL — ABNORMAL LOW (ref 4.22–5.81)
RDW: 15.6 % — ABNORMAL HIGH (ref 11.5–15.5)
WBC: 21.7 10*3/uL — ABNORMAL HIGH (ref 4.0–10.5)
nRBC: 0 % (ref 0.0–0.2)

## 2024-04-15 LAB — BASIC METABOLIC PANEL WITH GFR
Anion gap: 14 (ref 5–15)
BUN: 43 mg/dL — ABNORMAL HIGH (ref 8–23)
CO2: 21 mmol/L — ABNORMAL LOW (ref 22–32)
Calcium: 9.6 mg/dL (ref 8.9–10.3)
Chloride: 101 mmol/L (ref 98–111)
Creatinine, Ser: 1.02 mg/dL (ref 0.61–1.24)
GFR, Estimated: >60 mL/min (ref >60)
Glucose, Bld: 147 mg/dL — ABNORMAL HIGH (ref 70–99)
Potassium: 4.8 mmol/L (ref 3.5–5.1)
Sodium: 137 mmol/L (ref 135–145)

## 2024-04-15 LAB — LIPASE, BLOOD: Lipase: 24 U/L (ref 11–51)

## 2024-04-15 LAB — HEPATIC FUNCTION PANEL
ALT: 26 U/L (ref 0–44)
AST: 23 U/L (ref 15–41)
Albumin: 3.5 g/dL (ref 3.5–5.0)
Alkaline Phosphatase: 93 U/L (ref 38–126)
Bilirubin, Direct: 0.2 mg/dL (ref 0.0–0.2)
Indirect Bilirubin: 0.2 mg/dL — ABNORMAL LOW (ref 0.3–0.9)
Total Bilirubin: 0.4 mg/dL (ref 0.0–1.2)
Total Protein: 6.5 g/dL (ref 6.5–8.1)

## 2024-04-15 LAB — LACTIC ACID, PLASMA: Lactic Acid, Venous: 1.5 mmol/L (ref 0.5–1.9)

## 2024-04-15 MED ORDER — VANCOMYCIN HCL 500 MG IV SOLR
500.0000 mg | Freq: Once | INTRAVENOUS | Status: AC
  Administered 2024-04-15: 500 mg via INTRAVENOUS

## 2024-04-15 MED ORDER — LORAZEPAM 1 MG PO TABS
1.0000 mg | ORAL_TABLET | Freq: Once | ORAL | Status: AC
  Administered 2024-04-15: 1 mg via ORAL
  Filled 2024-04-15: qty 1

## 2024-04-15 MED ORDER — VANCOMYCIN HCL IN DEXTROSE 1-5 GM/200ML-% IV SOLN: 1000.0000 mg | Freq: Once | INTRAVENOUS | Status: DC

## 2024-04-15 MED ORDER — LACTATED RINGERS IV BOLUS
500.0000 mL | Freq: Once | INTRAVENOUS | Status: AC
  Administered 2024-04-15: 500 mL via INTRAVENOUS

## 2024-04-15 MED ORDER — FENTANYL CITRATE PF 50 MCG/ML IJ SOSY
50.0000 ug | PREFILLED_SYRINGE | Freq: Once | INTRAMUSCULAR | Status: AC
  Administered 2024-04-15: 50 ug via INTRAVENOUS
  Filled 2024-04-15: qty 1

## 2024-04-15 MED ORDER — IOHEXOL 350 MG/ML SOLN
100.0000 mL | Freq: Once | INTRAVENOUS | Status: AC | PRN
  Administered 2024-04-15: 80 mL via INTRAVENOUS

## 2024-04-15 MED ORDER — VANCOMYCIN HCL IN DEXTROSE 1-5 GM/200ML-% IV SOLN
1000.0000 mg | Freq: Once | INTRAVENOUS | Status: AC
  Administered 2024-04-15: 1000 mg via INTRAVENOUS
  Filled 2024-04-15: qty 200

## 2024-04-15 MED ORDER — SODIUM CHLORIDE 0.9 % IV SOLN
2.0000 g | Freq: Once | INTRAVENOUS | Status: AC
  Administered 2024-04-15: 2 g via INTRAVENOUS
  Filled 2024-04-15 (×2): qty 12.5

## 2024-04-15 NOTE — Progress Notes (Signed)
 Hospitalist Transfer Note:    Nursing staff, Please call [facility] Admits & Consults System-Wide number on Amion [phone]) as soon as patient's arrival, so appropriate admitting provider can evaluate the pt.   Transferring facility: [facility] Requesting provider: Dr. Rosealee Concha (EDP at [facility]) Reason for transfer: admission for further evaluation and management of cellulitis of the right lower extremity.    36 M w/ h/o pulmonary embolism for which he remains on Eliquis , interstitial lung disease, chronic leukocytosis with baseline white blood cell count of approximately 15,000, who presented to [facility] ED complaining of new onset right groin discomfort associated with erythema, with onset over the last few days.   EDP conveys no e/o crepitus on exam.  Vital signs in the ED were notable for the following: Afebrile; systolic blood pressures initially in the 90s mmHg, improving into the 120s mmHg following 500 cc LR bolus.   Labs were notable for cbc demonstrating interval increase in white blood cell count at 21,700, relative to most recent prior value of 15,900 when checked 6 days ago.  Imaging notable for CT scan, which shows no evidence of subcutaneous gas or drainable fluid collection associated with the right groin.  Medications administered prior to transfer included the following: IV Vanc, Cefepime , in addition to 500 cc LR bolus x 1.    Subsequently, I accepted this patient for transfer for inpatient admission to a med-tele bed at [facility] or [facility]  (first available) for further work-up and management of the above.       Camelia Cavalier, DO Hospitalist

## 2024-04-15 NOTE — ED Triage Notes (Signed)
 LRQ pain and groin. SOB at baseline- on 1LPM via [place]-appears to be no worse than normal.

## 2024-04-15 NOTE — ED Notes (Signed)
Chaperone for EDP for inguinal exam. Pt tolerated well.

## 2024-04-15 NOTE — Plan of Care (Signed)

## 2024-04-15 NOTE — ED Notes (Signed)
 Lt grn, blue, gold, lav sent

## 2024-04-15 NOTE — ED Provider Notes (Signed)
 [facility] AT [facility] Provider Note   CSN: [id number] Arrival date & time: 04/15/24  1636     History  Chief Complaint  Patient presents with   Groin Pain    [name] is a 89 y.o. male.  HPI   89 year old male with complex medical history to include AAA status post repair, PMR, branch retinal vein occlusion, cataracts, PE on Eliquis , CHF last EF 50 to 55% in September 2024, atrial flutter, DVT, atrial fibrillation, BPH, ILD, resection of the appendix and tip of the rectum in 2005, penile pump implantation in 2001 presenting to the emergency department with right lower quadrant abdominal pain.  The patient states that for the past day or so he has had pain in his right groin.  Pain radiates from the right lower quadrant into his right inguinal region.  He denies any nausea any vomiting.  While lying flat his pain is 0 out of 10, it is worse with standing.  No chest pain, no shortness of breath, his last bowel movement was this morning and he is passing gas.  He denies any urinary symptoms has Foley catheter in place connected to a leg bag.  Home Medications Prior to Admission medications   Medication Sig Start Date End Date Taking? Authorizing Provider  acetaminophen  (TYLENOL ) 325 MG tablet Take 2 tablets (650 mg total) by mouth every 6 (six) hours as needed for mild pain (or Fever >/= 101). 03/23/23   Armenta Landau, MD  apixaban  (ELIQUIS ) 2.5 MG TABS tablet Take 2.5 mg by mouth 2 (two) times daily. 05/09/23   [provider]  bisacodyl 5 MG EC tablet Take 10 mg by mouth daily as needed for moderate constipation.    [provider]  butalbital -acetaminophen -caffeine  (FIORICET ) 50-325-40 MG tablet Take 1 tablet by mouth daily as needed for headache. 03/23/23   Armenta Landau, MD  cholecalciferol (VITAMIN D3) 25 MCG (1000 UNIT) tablet Take 1,000 Units by mouth daily.    [provider]  diclofenac  Sodium (VOLTAREN ) 1 %  GEL Apply 2 g topically 4 (four) times daily as needed (shoulder pain). 03/23/23   Armenta Landau, MD  docusate sodium  (COLACE) 100 MG capsule Take 100 mg by mouth daily.    [provider]  feeding supplement (ENSURE ENLIVE / ENSURE PLUS) LIQD Take 237 mLs by mouth 3 (three) times daily between meals. 03/23/23   Armenta Landau, MD  finasteride (PROSCAR) 5 MG tablet Take 5 mg by mouth daily. 05/17/23   [provider]  fluorometholone  (FML) 0.1 % ophthalmic suspension Place 1 drop into the right eye at bedtime.     [provider]  folic acid  (FOLVITE ) 1 MG tablet Take 1 mg by mouth daily.    [provider]  LORazepam  (ATIVAN ) 0.5 MG tablet Take 0.5 mg by mouth daily. 0.25 am, 0.25 pm,  0.5 at night 04/01/23   [provider]  magnesium  hydroxide (MILK OF MAGNESIA) 400 MG/5ML suspension Take by mouth daily as needed for mild constipation.    [provider]  melatonin 5 MG TABS Take 1 tablet (5 mg total) by mouth at bedtime. 03/08/24   Dorsey, John T IV, MD  methotrexate (RHEUMATREX) 2.5 MG tablet Take 2.5 mg by mouth once a week. 02/15/24 08/13/24  [provider]  metoprolol  succinate (TOPROL -XL) 25 MG 24 hr tablet TAKE ONE TABLET BY MOUTH DAILY 03/21/24   Wendie Hamburg, MD  mirtazapine (REMERON) 15 MG  tablet Take 15 mg by mouth at bedtime. Pt taking 30 mg    [provider]  Multiple Vitamins-Minerals (MULTIVITAMIN PO) Take 1 tablet by mouth daily.     [provider]  ondansetron  (ZOFRAN ) 4 MG tablet Take 1 tablet (4 mg total) by mouth every 4 (four) hours as needed for nausea or vomiting. 08/11/23   Tobin Forts, MD  polyethylene glycol (MIRALAX / GLYCOLAX) 17 g packet Take 17 g by mouth 2 (two) times daily.    [provider]  promethazine  (PHENERGAN ) 12.5 MG tablet Take 1 tablet (12.5 mg total) by mouth daily as needed for nausea or vomiting. 08/11/23   Tobin Forts, MD  psyllium (REGULOID) 0.52  g capsule Take 0.52 g by mouth daily.    [provider]  senna-docusate (SENNA-S) 8.6-50 MG tablet Take 1 tablet by mouth daily.    [provider]  tamsulosin  (FLOMAX ) 0.4 MG CAPS capsule Take 0.4 mg by mouth at bedtime. PT TAKES 2 CAPSULES AT HS 06/10/20   [provider]  Tart Cherry 1200 MG CAPS Take 1 tablet by mouth daily.    [provider]  traMADol  (ULTRAM ) 50 MG tablet Take 50 mg by mouth every 4 (four) hours as needed for moderate pain or severe pain.    [provider]  zolpidem (AMBIEN) 10 MG tablet Take 5 mg by mouth at bedtime as needed for sleep.    [provider]      Allergies    Daptomycin , Rosuvastatin , Codeine, Morphine  and codeine, and Omnicef [cefdinir]    Review of Systems   Review of Systems  All other systems reviewed and are negative.   Physical Exam Updated Vital Signs BP 121/79   Pulse 90   Temp 98.8 F (37.1 C)   Resp (!) 22   SpO2 96%  Physical Exam Vitals and nursing note reviewed. Exam conducted with a chaperone present.  Constitutional:      General: He is not in acute distress.    Appearance: He is well-developed.  HENT:     Head: Normocephalic and atraumatic.  Eyes:     Conjunctiva/sclera: Conjunctivae normal.  Cardiovascular:     Rate and Rhythm: Normal rate and regular rhythm.     Heart sounds: No murmur heard. Pulmonary:     Effort: Pulmonary effort is normal. No respiratory distress.     Breath sounds: Normal breath sounds.  Abdominal:     Palpations: Abdomen is soft.     Tenderness: There is abdominal tenderness in the right lower quadrant.     Comments: Right lower quadrant tenderness to palpation, no rebound or guarding  Genitourinary:    Comments: GU exam performed with a chaperone, penile implant present, Foley catheter in place draining yellow urine, no palpable inguinal hernia on the right, did have inguinal tenderness, no penile lesions or testicular TTP. Mild overlying  erythema of the right inguinal crease. No crepitus. Musculoskeletal:        General: No swelling.     Cervical back: Neck supple.  Skin:    General: Skin is warm and dry.     Capillary Refill: Capillary refill takes less than 2 seconds.  Neurological:     Mental Status: He is alert.  Psychiatric:        Mood and Affect: Mood normal.     ED Results / Procedures / Treatments   Labs (all labs ordered are listed, but only abnormal results are displayed) Labs Reviewed  CBC WITH DIFFERENTIAL/PLATELET - Abnormal; Notable for the following components:      Result Value   WBC 21.7 (*)    RBC 3.62 (*)    Hemoglobin 12.3 (*)    HCT 37.2 (*)    MCV 102.8 (*)    RDW 15.6 (*)    Neutro Abs 19.5 (*)    Abs Immature Granulocytes 0.19 (*)    All other components within normal limits  BASIC METABOLIC PANEL WITH GFR - Abnormal; Notable for the following components:   CO2 21 (*)    Glucose, Bld 147 (*)    BUN 43 (*)    All other components within normal limits  HEPATIC FUNCTION PANEL - Abnormal; Notable for the following components:   Indirect Bilirubin 0.2 (*)    All other components within normal limits  LIPASE, BLOOD  LACTIC ACID, PLASMA    EKG None  Radiology CT Angio Abd/Pel W and/or Wo Contrast Result Date: 04/15/2024 CLINICAL DATA:  Right lower quadrant and right inguinal pain. Prior aortic aneurysm repair. EXAM: CTA ABDOMEN AND PELVIS WITHOUT AND WITH CONTRAST TECHNIQUE: Multidetector CT imaging of the abdomen and pelvis was performed using the standard protocol during bolus administration of intravenous contrast. Multiplanar reconstructed images and MIPs were obtained and reviewed to evaluate the vascular anatomy. RADIATION DOSE REDUCTION: This exam was performed according to the departmental dose-optimization program which includes automated exposure control, adjustment of the mA and/or kV according to patient size and/or use of iterative reconstruction technique. CONTRAST:  80mL  OMNIPAQUE  IOHEXOL  350 MG/ML SOLN COMPARISON:  11/02/2023 FINDINGS: VASCULAR Aorta: Status post AAA repair with infrarenal bifurcated stent graft which is patent. No visible endoleak. Maximum AP diameter 6.1 cm compared to 6.4 cm previously. Maximum transverse diameter 5.6 cm compared to 5.8 cm previously. Streak artifact from high-density embolic material within the native aneurysm sac again noted, causing streak artifact and limited visualization in the distal sac region. Maximum diameter of the suprarenal aorta 3.4 cm, unchanged. Celiac: Patent without evidence of aneurysm, dissection, vasculitis or significant stenosis. SMA: Patent without evidence of aneurysm, dissection, vasculitis or significant stenosis. Renals: Single right renal artery and 2 left renal arteries. All 3 vessels are patent without evidence of aneurysm, dissection, vasculitis, or fibromuscular dysplasia. IMA: Fills via collaterals. Inflow: Bifurcated stent graft extends to the distal internal iliac arteries. No evidence of endoleak. Proximal Outflow: Mild atherosclerotic calcifications. No aneurysm, dissection or significant stenosis. Veins: No obvious venous abnormality within the limitations of this arterial phase study. Review of the MIP images confirms the above findings. NON-VASCULAR Lower chest: Fibrosis in the lung bases. Mild cardiomegaly. No acute findings. Hepatobiliary: Scattered hypodensities in the liver most compatible with cysts, stable. No suspicious focal hepatic abnormality. Gallbladder unremarkable. Pancreas: No focal abnormality or ductal dilatation. Spleen: No focal abnormality.  Normal size. Adrenals/Urinary Tract: 3.8 cm midpole cyst in the right kidney, unchanged. No follow-up imaging recommended. No hydronephrosis. Adrenal glands unremarkable. Foley catheter present within the urinary bladder which is decompressed. Stomach/Bowel: Stomach, large and small bowel grossly unremarkable. No obstruction or inflammatory  process. Lymphatic: No adenopathy Reproductive: Mildly prominent prostate. Penile prosthesis partially visualized. The inflation balloon in the right lower quadrant/inguinal region is collapsed, unchanged since prior study. Immediately overlying the collapse balloon and tubing leading the balloon, there is stranding in the subcutaneous soft tissues in the right inguinal/groin region. This is new since prior study. No focal fluid collection to suggest abscess. Other: No free fluid or free air. Musculoskeletal: No acute  bony abnormality. IMPRESSION: VASCULAR Patent bifurcated stent graft repair of infrarenal abdominal aortic aneurysm. Maximum sac size measures up to 6.1 cm compared to 6.4 cm previously. No evidence of endoleak. NON-VASCULAR Penile prosthesis visualized with the collapsed inflation balloon in the right lower quadrant/inguinal region. The appearance is stable since prior study. There is stranding within the subcutaneous soft tissues in the right inguinal region overlying the collapsed balloon which could reflect cellulitis. No focal fluid collection/abscess. Fibrosis in the lung bases. Mildly enlarged prostate. Electronically Signed   By: Janeece Mechanic M.D.   On: 04/15/2024 19:47    Procedures Procedures    Medications Ordered in ED Medications  lactated ringers  bolus 500 mL (has no administration in time range)  ceFEPIme  (MAXIPIME ) 2 g in sodium chloride  0.9 % 100 mL IVPB (has no administration in time range)  vancomycin  (VANCOCIN ) IVPB 1000 mg/200 mL premix (has no administration in time range)    Followed by  vancomycin  (VANCOCIN ) 500 mg in sodium chloride  0.9 % 100 mL IVPB (has no administration in time range)  LORazepam  (ATIVAN ) tablet 1 mg (1 mg Oral Given 04/15/24 1807)  fentaNYL  (SUBLIMAZE ) injection 50 mcg (50 mcg Intravenous Given 04/15/24 1816)  iohexol  (OMNIPAQUE ) 350 MG/ML injection 100 mL (80 mLs Intravenous Contrast Given 04/15/24 1843)    ED Course/ Medical Decision  Making/ A&P                                 Medical Decision Making Amount and/or Complexity of Data Reviewed Labs: ordered. Radiology: ordered.  Risk Prescription drug management. Decision regarding hospitalization.    89 year old male with complex medical history to include AAA status post repair, PMR, branch retinal vein occlusion, cataracts, PE on Eliquis , CHF last EF 50 to 55% in September 2024, atrial flutter, DVT, atrial fibrillation, BPH, ILD, resection of the appendix and tip of the rectum in 2005, penile pump implantation in 2001 presenting to the emergency department with right lower quadrant abdominal pain.  The patient states that for the past day or so he has had pain in his right groin.  Pain radiates from the right lower quadrant into his right inguinal region.  He denies any nausea any vomiting.  While lying flat his pain is 0 out of 10, it is worse with standing.  No chest pain, no shortness of breath, his last bowel movement was this morning and he is passing gas.  He denies any urinary symptoms has Foley catheter in place connected to a leg bag.  On arrival, the patient was afebrile, not tachycardic or tachypneic, BP 193/67, saturating 97% on 1 L O2 via nasal cannula.  Physical exam revealed  GU exam performed with a chaperone, penile implant present, Foley catheter in place draining yellow urine, no palpable inguinal hernia on the right, did have inguinal tenderness, no penile lesions or testicular TTP, right lower quadrant tenderness to palpation, no rebound or guarding. Mild overlying erythema of the right inguinal crease. No crepitus.  Differential includes herniation of fat through the inguinal canal, less likely inguinal hernia, low concern for testicular torsion, low concern for appendicitis given patient's reported history of removal of the appendix although family had initially thought he was experiencing symptoms of appendicitis, lower concern for small bowel  obstruction, considered, consider diverticulitis, other acute intra-abdominal emergency.  Low concern for ruptured AAA graft status post repair. Considered groin cellulitis. Low suspicion for necrotizing fasciitis.  IV access  was obtained and the patient was administered IV fentanyl  for pain control, oral Ativan  for mild anxiousness in the emergency department.  Labs: CBC with a leukocytosis to 21.7, hemoglobin anemic to 12.3. patient appears to have a chronic leukocytosis however it is elevated compared to baseline of 14-16.  Lactic acid 1.5, lipase 24, hepatic function panel normal, BMP with mild non-anion gap acidosis with a bicarbonate of 21, anion gap 14, creatinine normal.  CTA Abd Pel:  IMPRESSION:  VASCULAR    Patent bifurcated stent graft repair of infrarenal abdominal aortic  aneurysm. Maximum sac size measures up to 6.1 cm compared to 6.4 cm  previously. No evidence of endoleak.    NON-VASCULAR    Penile prosthesis visualized with the collapsed inflation balloon in  the right lower quadrant/inguinal region. The appearance is stable  since prior study. There is stranding within the subcutaneous soft  tissues in the right inguinal region overlying the collapsed balloon  which could reflect cellulitis. No focal fluid collection/abscess.    Fibrosis in the lung bases.    Mildly enlarged prostate.   Patient with overlying erythema of the affected region, tenderness to palpation, jump in white blood cell count compared to baseline and borderline meeting SIRS criteria with initial soft pressures on arrival improved after fluid resuscitation, could be cellulitis with early sepsis.  IV antibiotics were administered.  Recommended admission for observation and antibiotics which patient and family were in agreement with.  Hospitalist medicine consulted for admission, Dr. Brock Canner accepting.  Final Clinical Impression(s) / ED Diagnoses Final diagnoses:  Cellulitis of groin    Rx / DC  Orders ED Discharge Orders     None         Rosealee Concha, MD 04/15/24 2112

## 2024-04-16 ENCOUNTER — Encounter (HOSPITAL_COMMUNITY): Payer: Self-pay | Admitting: Internal Medicine

## 2024-04-16 DIAGNOSIS — M353 Polymyalgia rheumatica: Secondary | ICD-10-CM | POA: Diagnosis not present

## 2024-04-16 DIAGNOSIS — F419 Anxiety disorder, unspecified: Secondary | ICD-10-CM | POA: Diagnosis not present

## 2024-04-16 DIAGNOSIS — L03314 Cellulitis of groin: Secondary | ICD-10-CM | POA: Diagnosis not present

## 2024-04-16 DIAGNOSIS — Z9889 Other specified postprocedural states: Secondary | ICD-10-CM | POA: Diagnosis not present

## 2024-04-16 LAB — BASIC METABOLIC PANEL WITH GFR
Anion gap: 11 (ref 5–15)
BUN: 38 mg/dL — ABNORMAL HIGH (ref 8–23)
CO2: 25 mmol/L (ref 22–32)
Calcium: 9 mg/dL (ref 8.9–10.3)
Chloride: 102 mmol/L (ref 98–111)
Creatinine, Ser: 1.16 mg/dL (ref 0.61–1.24)
GFR, Estimated: 59 mL/min — ABNORMAL LOW (ref >60)
Glucose, Bld: 84 mg/dL (ref 70–99)
Potassium: 4.1 mmol/L (ref 3.5–5.1)
Sodium: 138 mmol/L (ref 135–145)

## 2024-04-16 LAB — CBC
HCT: 36.5 % — ABNORMAL LOW (ref 39.0–52.0)
Hemoglobin: 12.1 g/dL — ABNORMAL LOW (ref 13.0–17.0)
MCH: 34.1 pg — ABNORMAL HIGH (ref 26.0–34.0)
MCHC: 33.2 g/dL (ref 30.0–36.0)
MCV: 102.8 fL — ABNORMAL HIGH (ref 80.0–100.0)
Platelets: 327 10*3/uL (ref 150–400)
RBC: 3.55 MIL/uL — ABNORMAL LOW (ref 4.22–5.81)
RDW: 15.4 % (ref 11.5–15.5)
WBC: 21.6 10*3/uL — ABNORMAL HIGH (ref 4.0–10.5)
nRBC: 0 % (ref 0.0–0.2)

## 2024-04-16 MED ORDER — ONDANSETRON HCL 4 MG/2ML IJ SOLN
4.0000 mg | Freq: Four times a day (QID) | INTRAMUSCULAR | Status: DC | PRN
Start: 2024-04-16 — End: 2024-04-18
  Administered 2024-04-16: 4 mg via INTRAVENOUS
  Filled 2024-04-16: qty 2

## 2024-04-16 MED ORDER — POLYETHYLENE GLYCOL 3350 17 G PO PACK
17.0000 g | PACK | Freq: Every day | ORAL | Status: DC | PRN
  Administered 2024-04-18: 17 g via ORAL
  Filled 2024-04-16 (×2): qty 1

## 2024-04-16 MED ORDER — CHLORHEXIDINE GLUCONATE CLOTH 2 % EX PADS
6.0000 | MEDICATED_PAD | Freq: Every day | CUTANEOUS | Status: DC
  Administered 2024-04-16 – 2024-04-18 (×3): 6 via TOPICAL

## 2024-04-16 MED ORDER — IBUPROFEN 200 MG PO TABS
400.0000 mg | ORAL_TABLET | Freq: Once | ORAL | Status: DC | PRN
  Filled 2024-04-16: qty 2

## 2024-04-16 MED ORDER — ACETAMINOPHEN 325 MG PO TABS
650.0000 mg | ORAL_TABLET | Freq: Four times a day (QID) | ORAL | Status: DC | PRN
  Administered 2024-04-16 – 2024-04-17 (×3): 650 mg via ORAL
  Filled 2024-04-16 (×3): qty 2

## 2024-04-16 MED ORDER — SODIUM CHLORIDE 0.9% FLUSH: 10.0000 mL | INTRAVENOUS | Status: DC | PRN

## 2024-04-16 MED ORDER — SODIUM CHLORIDE 0.9% FLUSH
3.0000 mL | Freq: Two times a day (BID) | INTRAVENOUS | Status: DC
  Administered 2024-04-16 – 2024-04-18 (×5): 3 mL via INTRAVENOUS

## 2024-04-16 MED ORDER — MIRTAZAPINE 15 MG PO TABS
30.0000 mg | ORAL_TABLET | Freq: Every day | ORAL | Status: DC
  Administered 2024-04-16 – 2024-04-17 (×3): 30 mg via ORAL
  Filled 2024-04-16 (×3): qty 2

## 2024-04-16 MED ORDER — ZOLPIDEM TARTRATE 5 MG PO TABS
5.0000 mg | ORAL_TABLET | Freq: Every evening | ORAL | Status: DC | PRN
  Administered 2024-04-16 – 2024-04-17 (×3): 5 mg via ORAL
  Filled 2024-04-16 (×3): qty 1

## 2024-04-16 MED ORDER — LORAZEPAM 0.5 MG PO TABS
0.2500 mg | ORAL_TABLET | Freq: Three times a day (TID) | ORAL | Status: DC | PRN
  Administered 2024-04-16: 0.25 mg via ORAL
  Filled 2024-04-16: qty 1

## 2024-04-16 MED ORDER — FINASTERIDE 5 MG PO TABS
5.0000 mg | ORAL_TABLET | Freq: Every day | ORAL | Status: DC
  Administered 2024-04-16 – 2024-04-17 (×2): 5 mg via ORAL
  Filled 2024-04-16 (×3): qty 1

## 2024-04-16 MED ORDER — APIXABAN 2.5 MG PO TABS
2.5000 mg | ORAL_TABLET | Freq: Two times a day (BID) | ORAL | Status: DC
  Administered 2024-04-16 – 2024-04-18 (×6): 2.5 mg via ORAL
  Filled 2024-04-16 (×6): qty 1

## 2024-04-16 MED ORDER — ACETAMINOPHEN 650 MG RE SUPP: 650.0000 mg | Freq: Four times a day (QID) | RECTAL | Status: DC | PRN

## 2024-04-16 MED ORDER — METOPROLOL SUCCINATE ER 25 MG PO TB24
25.0000 mg | ORAL_TABLET | Freq: Every day | ORAL | Status: DC
  Administered 2024-04-16: 25 mg via ORAL
  Filled 2024-04-16 (×3): qty 1

## 2024-04-16 MED ORDER — TAMSULOSIN HCL 0.4 MG PO CAPS
0.8000 mg | ORAL_CAPSULE | Freq: Every day | ORAL | Status: DC
  Administered 2024-04-16 – 2024-04-17 (×3): 0.8 mg via ORAL
  Filled 2024-04-16 (×3): qty 2

## 2024-04-16 MED ORDER — LINEZOLID 600 MG/300ML IV SOLN
600.0000 mg | Freq: Two times a day (BID) | INTRAVENOUS | Status: DC
  Administered 2024-04-16 – 2024-04-18 (×5): 600 mg via INTRAVENOUS
  Filled 2024-04-16 (×5): qty 300

## 2024-04-16 MED ORDER — PANTOPRAZOLE SODIUM 40 MG PO TBEC
40.0000 mg | DELAYED_RELEASE_TABLET | Freq: Every day | ORAL | Status: DC
  Administered 2024-04-16 – 2024-04-18 (×3): 40 mg via ORAL
  Filled 2024-04-16 (×3): qty 1

## 2024-04-16 NOTE — TOC Initial Note (Signed)
 Transition of Care [facility]) - Initial/Assessment Note    Patient Details  Name: [name] MRN: [id number] Date of Birth: 10-29-1930  Transition of Care [facility]) CM/SW Contact:    Tessie Fila, RN Phone Number: 04/16/2024, 10:41 AM  Clinical Narrative:                 Spoke with pt spouse at bedside. Pt resting while CM at bedside. Spouse provides information. She states pt uses O2 @ 1-2L at baseline. [employer] supplies pt oxygen  needs. Spouse will supply a travel tank at discharge. Pt has private caregivers that assist him throughout the day. He has been receiving OPPT with [facility]. She denies any needs for DME at this time. TOC will continue to follow for any new recommendations.   Expected Discharge Plan: OP Rehab Barriers to Discharge: Continued Medical Work up   Patient Goals and CMS Choice Patient states their goals for this hospitalization and ongoing recovery are:: To return home with spouse CMS Medicare.gov Compare Post Acute Care list provided to:: Other (Comment Required) (NA) Choice offered to / list presented to : NA [place] ownership interest in [facility].provided to:: Parent NA    Expected Discharge Plan and Services In-house Referral: NA Discharge Planning Services: CM Consult Post Acute Care Choice: NA Living arrangements for the past 2 months: Single Family Home                 DME Arranged: N/A DME Agency: [employer]       HH Arranged: NA          Prior Living Arrangements/Services Living arrangements for the past 2 months: Single Family Home Lives with:: Spouse Patient language and need for interpreter reviewed:: Yes Do you feel safe going back to the place where you live?: Yes      Need for Family Participation in Patient Care: Yes (Comment) Care giver support system in place?: Yes (comment) Current home services: Homehealth aide, DME Criminal Activity/Legal Involvement Pertinent to Current Situation/Hospitalization: No -  Comment as needed  Activities of Daily Living   ADL Screening (condition at time of admission) Independently performs ADLs?: No Does the patient have a NEW difficulty with bathing/dressing/toileting/self-feeding that is expected to last >3 days?: No Does the patient have a NEW difficulty with getting in/out of bed, walking, or climbing stairs that is expected to last >3 days?: No Does the patient have a NEW difficulty with communication that is expected to last >3 days?: No Is the patient deaf or have difficulty hearing?: No Does the patient have difficulty seeing, even when wearing glasses/contacts?: No Does the patient have difficulty concentrating, remembering, or making decisions?: No  Permission Sought/Granted Permission sought to share information with : Family Supports Permission granted to share information with : Yes, Verbal Permission Granted  Share Information with NAME: Gosling,Susan (Spouse)  [phone]           Emotional Assessment Appearance:: Appears stated age Attitude/Demeanor/Rapport: Other (comment) (Pt sleeping) Affect (typically observed): Unable to Assess Orientation: : Oriented to Self, Oriented to Place, Oriented to  Time, Oriented to Situation Alcohol  / Substance Use: Not Applicable Psych Involvement: No (comment)  Admission diagnosis:  Cellulitis of groin [L03.314] Cellulitis of right lower extremity [L03.115] Patient Active Problem List   Diagnosis Date Noted   Cellulitis of right groin 04/15/2024   IDA (iron deficiency anemia) 07/13/2023   Decubitus ulcer of coccygeal region, stage 2 (HCC) 07/06/2023   Elevated PSA 07/06/2023   Polymyalgia rheumatica (  HCC) 07/06/2023   ILD (interstitial lung disease) (HCC) 07/06/2023   Leukocytosis 07/06/2023   Edema of left lower extremity 06/15/2023   Prostate hypertrophy 05/16/2023   Normocytic anemia 05/08/2023   IgM monoclonal gammopathy of uncertain significance 05/08/2023   Other constipation  05/08/2023   Neutrophilic leukocytosis 05/08/2023   Thrombocytosis 05/08/2023   Family hx-leukemia 05/08/2023   Adjustment disorder with mixed anxiety and depressed mood 04/21/2023   Severe protein-calorie malnutrition (HCC) 04/04/2023   Delirium 03/14/2023   Acute eosinophilic pneumonia 03/14/2023   Acute metabolic encephalopathy 03/13/2023   Paroxysmal A-fib (HCC) 03/13/2023   Pressure injury of skin 03/13/2023   PAF (paroxysmal atrial fibrillation) (HCC) 03/08/2023   Insomnia 03/07/2023   Anxiety 03/07/2023   DVT, lower extremity, distal, acute, right (HCC) 03/07/2023   Pulmonary embolism (HCC) 03/06/2023   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   Aortitis (HCC) 03/06/2023   Atrial flutter (HCC) 03/06/2023   FUO (fever of unknown origin) 03/06/2023   S/P insertion of endovascular thoracic aortic stent graft 03/06/2023   Acute pulmonary embolism (HCC) 03/05/2023   Chronic respiratory failure with hypoxia (HCC) 03/05/2023   Acute hyponatremia 03/05/2023   Elevated troponin 03/05/2023   Lactic acidosis 03/05/2023   HCAP (healthcare-associated pneumonia) 03/05/2023   Chronic diastolic CHF (congestive heart failure) (HCC) 03/05/2023   BPH (benign prostatic hyperplasia) 03/05/2023   Fever 01/24/2023   S/P AAA repair 08/16/2017   Generalized weakness 04/03/2017   Abdominal pain, RUQ 03/28/2017   Sinusitis 03/14/2017   Skin tear of hand without complication, initial encounter 01/11/2017   Abrasion, multiple sites 01/11/2017   Mass of left hand 10/19/2016   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   AAA (abdominal aortic aneurysm) without rupture (HCC) 01/26/2016   Dupuytren's contracture of left hand 08/26/2014   Contracture of joint of finger of left hand 08/26/2014   Branch retinal vein occlusion 05/27/2014   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   Special screening for malignant neoplasm of prostate 02/18/2014   Urinary frequency 02/18/2014   Incomplete bladder emptying 02/18/2014    AAA (abdominal aortic aneurysm) (HCC) 01/07/2014   Hyperlipidemia 08/20/2013   PMR (polymyalgia rheumatica) (HCC) 08/20/2013   Vitamin D  deficiency 08/20/2013   Impotence of organic origin 08/20/2013   Floater, vitreous 10/19/2012   Cerebrovascular disease 09/28/2012   Cataract, nuclear 03/14/2012   Corneal macula not interfering with central vision 03/14/2012   History of colonic polyps 03/16/2010   PCP:  [name], MD Pharmacy:   [facility] - [place], [place] - [address] [place] [address] Phone: [phone] Fax: [phone]     Social Drivers of Health (SDOH) Social History: SDOH Screenings   Food Insecurity: No Food Insecurity (04/15/2024)  Housing: Low Risk  (04/15/2024)  Transportation Needs: No Transportation Needs (04/15/2024)  Utilities: Not At Risk (04/15/2024)  Financial Resource Strain: Low Risk  (03/07/2023)  Social Connections: Socially Integrated (04/15/2024)  Tobacco Use: Low Risk  (04/16/2024)   SDOH Interventions:     Readmission Risk Interventions    04/16/2024   10:10 AM  Readmission Risk Prevention Plan  Post Dischage Appt Complete  Medication Screening Complete  Transportation Screening Complete

## 2024-04-16 NOTE — Progress Notes (Signed)
   04/16/24 0114  Assess: MEWS Score  BP (!) 144/106  MAP (mmHg) 117  Pulse Rate (!) 115  Resp 20  Assess: MEWS Score  MEWS Temp 0  MEWS Systolic 0  MEWS Pulse 2  MEWS RR 0  MEWS LOC 0  MEWS Score 2  MEWS Score Color Yellow  Assess: if the MEWS score is Yellow or Red  Were vital signs accurate and taken at a resting state? Yes  Does the patient meet 2 or more of the SIRS criteria? No  MEWS guidelines implemented  Yes, yellow  Treat  MEWS Interventions Considered administering scheduled or prn medications/treatments as ordered  Take Vital Signs  Increase Vital Sign Frequency  Yellow: Q2hr x1, continue Q4hrs until patient remains green for 12hrs  Escalate  MEWS: Escalate Yellow: Discuss with charge nurse and consider notifying provider and/or RRT  Notify: Charge Nurse/RN  Name of Charge Nurse/RN Notified Tom RN  Provider Notification  Provider Name/Title Opyd MD  Date Provider Notified 04/16/24  Time Provider Notified 0149  Method of Notification Page (secure chat)  Notification Reason Other (Comment) (yellow mews, tachycardia, hypertension)  Provider response No new orders  Assess: SIRS CRITERIA  SIRS Temperature  0  SIRS Respirations  0  SIRS Pulse 1  SIRS WBC 0  SIRS Score Sum  1

## 2024-04-16 NOTE — H&P (Addendum)
 History and Physical    [name]:[id number] DOB: 1929/12/03 DOA: 04/15/2024  PCP: [name], MD   Patient coming from: Home   Chief Complaint: Right groin pain and redness   HPI: [name] is a 89 y.o. male with medical history significant for PAF on Eliquis , AAA s/p repair, anxiety, insomnia, PMR, iron deficiency anemia, and MGUS with recent bone marrow biopsy who presents with pain and redness in the right groin.  Patient has been complaining of pain in the right groin for the past 2 days.  His chronic anxiety and agitation has also worsened per report of family.  [facility] ED Course: Upon arrival to the ED, patient is found to be afebrile and saturating well on room air with transient tachycardia and stable BP.  Labs are most notable for elevated BUN to creatinine ratio, WBC 21,700, and normal lactic acid.  CT demonstrates inflammatory stranding in the right groin soft tissues which could reflect cellulitis.  Patient was given 500 mL of LR, IV Ativan , fentanyl , vancomycin , and cefepime  in the ED.  He was transferred to [facility] for admission.  Review of Systems:  ROS limited by patient's clinical condition.  Past Medical History:  Diagnosis Date   AAA (abdominal aortic aneurysm) (HCC)    Abnormal EKG    Left atrial abnormality   Allergic rhinitis    Allergy    Benign neoplasm of colon 04/14/2010   3 small polyps on colonoscopy by Dr. Elvin Hammer   Carpal tunnel syndrome    Cataract    Decubitus ulcer of coccygeal region, stage 2 (HCC) 07/06/2023   Degenerative disc disease    Disturbances metabolism of methionine, homocystine, and cystathionine (HCC)    Elevated homocysteine   Elevated prostate specific antigen (PSA)    Elevated PSA 07/06/2023   Hearing loss    Hyperlipidemia    ILD (interstitial lung disease) (HCC) 07/06/2023   Impotence of organic origin    Penile implant   Internal hemorrhoids    Leukocytosis 07/06/2023   Neck  pain    Otosclerosis of both ears    Peripheral vascular disease (HCC)    Bilateral femoral bruit   Plantar fasciitis    Polymyalgia rheumatica (HCC)    Polymyalgia rheumatica (HCC) 07/06/2023   Rotator cuff syndrome of left shoulder    Scoliosis    Shoulder pain     Past Surgical History:  Procedure Laterality Date   ABDOMINAL AORTIC ENDOVASCULAR STENT GRAFT N/A 08/16/2017   Procedure: ABDOMINAL AORTIC ENDOVASCULAR STENT GRAFT insertion;  Surgeon: Margherita Shell, MD;  Location: [facility] OR;  Service: Vascular;  Laterality: N/A;   Actinic keratosis removal  06/21/2010   Left shoulder; Dr. 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   Procedure: DUPUYTREN CONTRACTURE RELEASE RIGHT LONG, RING AND SMALL FINGERS;  Surgeon: Amelie Baize., MD;  Location: [facility];  Service: Orthopedics;  Laterality: Right;   Implant penile pump  2001   IR ANGIOGRAM PELVIS SELECTIVE OR SUPRASELECTIVE  12/11/2020   IR ANGIOGRAM SELECTIVE EACH ADDITIONAL VESSEL  12/11/2020   IR EMBO ARTERIAL NOT HEMORR HEMANG INC GUIDE ROADMAPPING  12/11/2020   IR RADIOLOGIST EVAL & MGMT  07/30/2020   IR RADIOLOGIST EVAL & MGMT  08/16/2023   IR US  GUIDE VASC ACCESS RIGHT  12/11/2020   Resection of appendix and tip of rectum  February 2005   STAPEDECTOMY  Bilateral 1985, 1988   [employer]    Social History:   reports that he has never smoked. He has never used smokeless tobacco. He reports that he does not drink alcohol  and does not use drugs.  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   Rosuvastatin  Other (See Comments)    Stopped taking due to feeling achy     Codeine Nausea And Vomiting   Morphine  And Codeine Nausea And Vomiting   Omnicef [Cefdinir] Other (See Comments)    Abdominal pain    Family History  Problem Relation Age of Onset   Heart disease Mother    Emphysema  Mother    Leukemia Brother        Chronic lymphocytic leukemia   Colon cancer Neg Hx    Esophageal cancer Neg Hx    Rectal cancer Neg Hx    Stomach cancer Neg Hx      Prior to Admission medications   Medication Sig Start Date End Date Taking? Authorizing Provider  acetaminophen  (TYLENOL ) 325 MG tablet Take 2 tablets (650 mg total) by mouth every 6 (six) hours as needed for mild pain (or Fever >/= 101). 03/23/23   Armenta Landau, MD  apixaban  (ELIQUIS ) 2.5 MG TABS tablet Take 2.5 mg by mouth 2 (two) times daily. 05/09/23   [provider]  bisacodyl 5 MG EC tablet Take 10 mg by mouth daily as needed for moderate constipation.    [provider]  butalbital -acetaminophen -caffeine  (FIORICET ) 50-325-40 MG tablet Take 1 tablet by mouth daily as needed for headache. 03/23/23   Armenta Landau, MD  cholecalciferol (VITAMIN D3) 25 MCG (1000 UNIT) tablet Take 1,000 Units by mouth daily.    [provider]  diclofenac  Sodium (VOLTAREN ) 1 % GEL Apply 2 g topically 4 (four) times daily as needed (shoulder pain). 03/23/23   Armenta Landau, MD  docusate sodium  (COLACE) 100 MG capsule Take 100 mg by mouth daily.    [provider]  feeding supplement (ENSURE ENLIVE / ENSURE PLUS) LIQD Take 237 mLs by mouth 3 (three) times daily between meals. 03/23/23   Armenta Landau, MD  finasteride (PROSCAR) 5 MG tablet Take 5 mg by mouth daily. 05/17/23   [provider]  fluorometholone  (FML) 0.1 % ophthalmic suspension Place 1 drop into the right eye at bedtime.     [provider]  folic acid  (FOLVITE ) 1 MG tablet Take 1 mg by mouth daily.    [provider]  LORazepam  (ATIVAN ) 0.5 MG tablet Take 0.5 mg by mouth daily. 0.25 am, 0.25 pm,  0.5 at night 04/01/23   [provider]  magnesium  hydroxide (MILK OF MAGNESIA) 400 MG/5ML suspension Take by mouth daily as needed for mild constipation.    [provider]  melatonin 5 MG TABS  Take 1 tablet (5 mg total) by mouth at bedtime. 03/08/24   Dorsey, John T IV, MD  methotrexate (RHEUMATREX) 2.5 MG tablet Take 2.5 mg by mouth once a week. 02/15/24 08/13/24  [provider]  metoprolol  succinate (TOPROL -XL) 25 MG 24 hr tablet TAKE ONE TABLET BY MOUTH DAILY 03/21/24   Wendie Hamburg, MD  mirtazapine (REMERON) 15 MG tablet Take 15 mg by mouth at bedtime. Pt taking 30 mg    [provider]  Multiple Vitamins-Minerals (MULTIVITAMIN PO) Take 1 tablet by mouth daily.     [provider]  ondansetron  (ZOFRAN ) 4 MG  tablet Take 1 tablet (4 mg total) by mouth every 4 (four) hours as needed for nausea or vomiting. 08/11/23   Tobin Forts, MD  polyethylene glycol (MIRALAX / GLYCOLAX) 17 g packet Take 17 g by mouth 2 (two) times daily.    [provider]  promethazine  (PHENERGAN ) 12.5 MG tablet Take 1 tablet (12.5 mg total) by mouth daily as needed for nausea or vomiting. 08/11/23   Tobin Forts, MD  psyllium (REGULOID) 0.52 g capsule Take 0.52 g by mouth daily.    [provider]  senna-docusate (SENNA-S) 8.6-50 MG tablet Take 1 tablet by mouth daily.    [provider]  tamsulosin  (FLOMAX ) 0.4 MG CAPS capsule Take 0.4 mg by mouth at bedtime. PT TAKES 2 CAPSULES AT HS 06/10/20   [provider]  Tart Cherry 1200 MG CAPS Take 1 tablet by mouth daily.    [provider]  traMADol  (ULTRAM ) 50 MG tablet Take 50 mg by mouth every 4 (four) hours as needed for moderate pain or severe pain.    [provider]  zolpidem (AMBIEN) 10 MG tablet Take 5 mg by mouth at bedtime as needed for sleep.    [provider]    Physical Exam: Vitals:   04/15/24 2141 04/15/24 2244 04/16/24 0032 04/16/24 0034  BP: 123/67 126/70    Pulse:  81    Resp: 18 16    Temp:  98.6 F (37 C) 100.2 F (37.9 C) 98.1 F (36.7 C)  TempSrc:  Oral Axillary Oral  SpO2:  100%      Constitutional: NAD, no pallor or diaphoresis    Eyes: PERTLA, lids and conjunctivae normal ENMT: Mucous membranes are moist. Posterior pharynx clear of any exudate or lesions.   Neck: supple, no masses  Respiratory: no wheezing, no crackles. No accessory muscle use.  Cardiovascular: S1 & S2 heard, regular rate and rhythm. No JVD. Abdomen: No tenderness, soft. Bowel sounds active.  Musculoskeletal: no clubbing / cyanosis. No joint deformity upper and lower extremities.   Skin: Faint erythema, heat, and tenderness in right groin without fluctuance or crepitus. Skin is otherwise warm, dry, well-perfused. Neurologic: CN 2-12 grossly intact. Sleeping, wakes to voice.    Labs and Imaging on Admission: I have personally reviewed following labs and imaging studies  CBC: Recent Labs  Lab 04/09/24 1035 04/15/24 1712  WBC 15.9* 21.7*  NEUTROABS 11.8* 19.5*  HGB 12.1* 12.3*  HCT 36.7* 37.2*  MCV 104.9* 102.8*  PLT 307 323   Basic Metabolic Panel: Recent Labs  Lab 04/15/24 1712  NA 137  K 4.8  CL 101  CO2 21*  GLUCOSE 147*  BUN 43*  CREATININE 1.02  CALCIUM  9.6   GFR: CrCl cannot be calculated (Unknown ideal weight.). Liver Function Tests: Recent Labs  Lab 04/15/24 1712  AST 23  ALT 26  ALKPHOS 93  BILITOT 0.4  PROT 6.5  ALBUMIN  3.5   Recent Labs  Lab 04/15/24 1712  LIPASE 24   No results for input(s): "AMMONIA" in the last 168 hours. Coagulation Profile: No results for input(s): "INR", "PROTIME" in the last 168 hours. Cardiac Enzymes: No results for input(s): "CKTOTAL", "CKMB", "CKMBINDEX", "TROPONINI" in the last 168 hours. BNP (last 3 results) Recent Labs    06/14/23 1217 07/03/23 0949 12/27/23 1607  PROBNP 243.0* 230.0* 99.0   HbA1C: No results for input(s): "HGBA1C" in the last 72 hours. CBG: No results for input(s): "GLUCAP" in the last 168 hours. Lipid Profile:  No results for input(s): "CHOL", "HDL", "LDLCALC", "TRIG", "CHOLHDL", "LDLDIRECT" in the last 72 hours. Thyroid  Function Tests: No  results for input(s): "TSH", "T4TOTAL", "FREET4", "T3FREE", "THYROIDAB" in the last 72 hours. Anemia Panel: No results for input(s): "VITAMINB12", "FOLATE", "FERRITIN", "TIBC", "IRON", "RETICCTPCT" in the last 72 hours. Urine analysis:    Component Value Date/Time   COLORURINE AMBER (A) 03/05/2023 1845   APPEARANCEUR CLEAR 03/05/2023 1845   LABSPEC 1.017 03/05/2023 1845   PHURINE 6.0 03/05/2023 1845   GLUCOSEU NEGATIVE 03/05/2023 1845   HGBUR SMALL (A) 03/05/2023 1845   BILIRUBINUR NEGATIVE 03/05/2023 1845   KETONESUR NEGATIVE 03/05/2023 1845   PROTEINUR 100 (A) 03/05/2023 1845   NITRITE NEGATIVE 03/05/2023 1845   LEUKOCYTESUR NEGATIVE 03/05/2023 1845   Sepsis Labs: @LABRCNTIP (procalcitonin:4,lacticidven:4) )No results found for this or any previous visit (from the past 240 hours).   Radiological Exams on Admission: CT Angio Abd/Pel W and/or Wo Contrast Result Date: 04/15/2024 CLINICAL DATA:  Right lower quadrant and right inguinal pain. Prior aortic aneurysm repair. EXAM: CTA ABDOMEN AND PELVIS WITHOUT AND WITH CONTRAST TECHNIQUE: Multidetector CT imaging of the abdomen and pelvis was performed using the standard protocol during bolus administration of intravenous contrast. Multiplanar reconstructed images and MIPs were obtained and reviewed to evaluate the vascular anatomy. RADIATION DOSE REDUCTION: This exam was performed according to the departmental dose-optimization program which includes automated exposure control, adjustment of the mA and/or kV according to patient size and/or use of iterative reconstruction technique. CONTRAST:  80mL OMNIPAQUE  IOHEXOL  350 MG/ML SOLN COMPARISON:  11/02/2023 FINDINGS: VASCULAR Aorta: Status post AAA repair with infrarenal bifurcated stent graft which is patent. No visible endoleak. Maximum AP diameter 6.1 cm compared to 6.4 cm previously. Maximum transverse diameter 5.6 cm compared to 5.8 cm previously. Streak artifact from high-density embolic  material within the native aneurysm sac again noted, causing streak artifact and limited visualization in the distal sac region. Maximum diameter of the suprarenal aorta 3.4 cm, unchanged. Celiac: Patent without evidence of aneurysm, dissection, vasculitis or significant stenosis. SMA: Patent without evidence of aneurysm, dissection, vasculitis or significant stenosis. Renals: Single right renal artery and 2 left renal arteries. All 3 vessels are patent without evidence of aneurysm, dissection, vasculitis, or fibromuscular dysplasia. IMA: Fills via collaterals. Inflow: Bifurcated stent graft extends to the distal internal iliac arteries. No evidence of endoleak. Proximal Outflow: Mild atherosclerotic calcifications. No aneurysm, dissection or significant stenosis. Veins: No obvious venous abnormality within the limitations of this arterial phase study. Review of the MIP images confirms the above findings. NON-VASCULAR Lower chest: Fibrosis in the lung bases. Mild cardiomegaly. No acute findings. Hepatobiliary: Scattered hypodensities in the liver most compatible with cysts, stable. No suspicious focal hepatic abnormality. Gallbladder unremarkable. Pancreas: No focal abnormality or ductal dilatation. Spleen: No focal abnormality.  Normal size. Adrenals/Urinary Tract: 3.8 cm midpole cyst in the right kidney, unchanged. No follow-up imaging recommended. No hydronephrosis. Adrenal glands unremarkable. Foley catheter present within the urinary bladder which is decompressed. Stomach/Bowel: Stomach, large and small bowel grossly unremarkable. No obstruction or inflammatory process. Lymphatic: No adenopathy Reproductive: Mildly prominent prostate. Penile prosthesis partially visualized. The inflation balloon in the right lower quadrant/inguinal region is collapsed, unchanged since prior study. Immediately overlying the collapse balloon and tubing leading the balloon, there is stranding in the subcutaneous soft tissues in  the right inguinal/groin region. This is new since prior study. No focal fluid collection to suggest abscess. Other: No free fluid or free air. Musculoskeletal: No acute bony abnormality. IMPRESSION: VASCULAR  Patent bifurcated stent graft repair of infrarenal abdominal aortic aneurysm. Maximum sac size measures up to 6.1 cm compared to 6.4 cm previously. No evidence of endoleak. NON-VASCULAR Penile prosthesis visualized with the collapsed inflation balloon in the right lower quadrant/inguinal region. The appearance is stable since prior study. There is stranding within the subcutaneous soft tissues in the right inguinal region overlying the collapsed balloon which could reflect cellulitis. No focal fluid collection/abscess. Fibrosis in the lung bases. Mildly enlarged prostate. Electronically Signed   By: Janeece Mechanic M.D.   On: 04/15/2024 19:47     Assessment/Plan   1. Cellulitis  - No underlying abscess or gas on CT   - There is a penile pump reservoir immediately underlying the inflamed soft tissue on CT; case was reviewed with urology, Dr. Aden Agreste, who advised conservative management with antibiotics and consideration of device extraction only if he fails conservative treatment   - Continue empiric antibiotics, monitor response to treatment    2. ILD; chronic hypoxic respiratory failure  - Appears to be stable, continue supplemental O2 and pulmonology follow-up     3. PAF  - Eliquis , metoprolol    4. Anxiety, insomnia  - Prescription database reviewed, continue low-dose Ativan , Ambien, Remeron    5. Hx of DVT and PE  - Eliquis    6. AAA  - S/p repair, appears stable on CTA today   7. PMR  - Managed with methotrexate     DVT prophylaxis: Eliquis   Code Status: DNR  Level of Care: Level of care: Telemetry Family Communication: Son and wife at bedside  Disposition Plan:  Patient is from: Home  Anticipated d/c is to: Home  Anticipated d/c date is: 04/17/24  Patient currently: Pending  clinical improvement and transition to oral medications  Consults called: None  Admission status: inpatient     Walton Guppy, MD [facility]  04/16/2024, 12:40 AM

## 2024-04-16 NOTE — Plan of Care (Signed)

## 2024-04-16 NOTE — Progress Notes (Signed)
 [facility]                                                                              [name], is a 89 y.o. male, DOB - Dec 16, 1929, [id number] Admit date - 04/15/2024    Outpatient Primary MD for the patient is Randall Hun, MD  LOS - 1  days  Chief Complaint  Patient presents with   Groin Pain       Brief summary   Patient is a 89 year old male with paroxysmal A-fib on Eliquis , AAA status post repair, anxiety, insomnia, iron deficiency anemia, MGUS with recent bone marrow biopsy who presents with pain and redness in the right groin. Patient has been complaining of pain in the right groin for the past 2 days.  His chronic anxiety and agitation has also worsened per report of family. WBC 21,700, and normal lactic acid.  CT demonstrates inflammatory stranding in the right groin soft tissues which could reflect cellulitis.    Assessment & Plan    Principal Problem:   Cellulitis of right groin - CT showed inflammatory stranding in the right groin soft tissues, no underlying abscess or gas on CT   - There is a penile pump reservoir immediately underlying the inflamed soft tissue on CT. Dr Brice Campi discussed with urology, Dr. Aden Agreste, who recommended conservative management with antibiotics and consideration of device extraction only if he fails conservative treatment   - Currently on IV linezolid    ILD; chronic hypoxic respiratory failure  - Stable, no wheezing  - Continue supplemental O2, outpatient pulmonology follow-up  Paroxysmal atrial fibrillation Rate controlled, continue metoprolol  - Continue Eliquis     Anxiety, insomnia  - Prescription database reviewed, continue low-dose Ativan , Ambien, Remeron     Hx of DVT and PE  - Continue Eliquis      AAA  - S/p repair, appears stable on CTA    PMR  - Managed with methotrexate    Estimated body mass index is 23.42 kg/m as calculated from the following:   Height as of 02/19/24: 6' (1.829 m).   Weight  as of 03/08/24: 78.3 kg.  Code Status: DNR DVT Prophylaxis:  apixaban  (ELIQUIS ) tablet 2.5 mg Start: 04/16/24 0100 apixaban  (ELIQUIS ) tablet 2.5 mg   Level of Care: Level of care: Telemetry Family Communication: Updated patient's wife at the bedside Disposition Plan:      Remains inpatient appropriate: Hopefully DC home in next 24 to 48 hours if improving   Procedures:    Consultants:   urology on phone  Antimicrobials:   Anti-infectives (From admission, onward)    Start     Dose/Rate Route Frequency Ordered Stop   04/16/24 1000  linezolid (ZYVOX) IVPB 600 mg        600 mg 300 mL/hr over 60 Minutes Intravenous Every 12 hours 04/16/24 0039 04/23/24 0959   04/15/24 2045  vancomycin  (VANCOCIN ) IVPB 1000 mg/200 mL premix       Placed in "Followed by" Linked Group   1,000 mg 200 mL/hr over 60 Minutes Intravenous  Once 04/15/24 2031 04/15/24 2223   04/15/24 2045  vancomycin  (VANCOCIN ) 500 mg in sodium chloride   0.9 % 100 mL IVPB       Placed in "Followed by" Linked Group   500 mg 100 mL/hr over 60 Minutes Intravenous  Once 04/15/24 2031 04/15/24 2255   04/15/24 2030  vancomycin  (VANCOCIN ) IVPB 1000 mg/200 mL premix  Status:  Discontinued        1,000 mg 200 mL/hr over 60 Minutes Intravenous  Once 04/15/24 2024 04/15/24 2031   04/15/24 2030  ceFEPIme  (MAXIPIME ) 2 g in sodium chloride  0.9 % 100 mL IVPB        2 g 200 mL/hr over 30 Minutes Intravenous  Once 04/15/24 2024 04/15/24 2330          Medications  apixaban   2.5 mg Oral BID   Chlorhexidine  Gluconate Cloth  6 each Topical Daily   finasteride  5 mg Oral Daily   metoprolol  succinate  25 mg Oral Daily   mirtazapine  30 mg Oral QHS   pantoprazole   40 mg Oral QAC breakfast   sodium chloride  flush  3 mL Intravenous Q12H   tamsulosin   0.8 mg Oral QHS      Subjective:   [name] was seen and examined today.  Has right sided inguinal pain, overall improving.  Wife at the bedside.  Low-grade temp last night 100.4  F.  No fever this morning.  Resting comfortably.  Objective:   Vitals:   04/16/24 0114 04/16/24 0322 04/16/24 0800 04/16/24 1325  BP: (!) 144/106 107/68 (!) 136/103 (!) 142/119  Pulse: (!) 115 (!) 102 77 73  Resp: 20 (!) 22 18 18   Temp:  (!) 100.4 F (38 C) 98.1 F (36.7 C) 97.8 F (36.6 C)  TempSrc:  Axillary    SpO2:  99% 100% 96%    Intake/Output Summary (Last 24 hours) at 04/16/2024 1328 Last data filed at 04/16/2024 1133 Gross per 24 hour  Intake 0 ml  Output 1450 ml  Net -1450 ml     Wt Readings from Last 3 Encounters:  03/08/24 78.3 kg  02/19/24 76.4 kg  12/12/23 74.2 kg     Exam General: Alert and oriented x 3, NAD Cardiovascular: S1 S2 auscultated,  RRR Respiratory: Clear to auscultation bilaterally Gastrointestinal: Soft, nontender, nondistended, + bowel sounds Ext: no pedal edema bilaterally Neuro: no new deficits Skin: Faint, tenderness in the right groin but no fluctuance, abscess or crepitus Psych: Normal affect     Data Reviewed:  I have personally reviewed following labs    CBC Lab Results  Component Value Date   WBC 21.6 (H) 04/16/2024   RBC 3.55 (L) 04/16/2024   HGB 12.1 (L) 04/16/2024   HCT 36.5 (L) 04/16/2024   MCV 102.8 (H) 04/16/2024   MCH 34.1 (H) 04/16/2024   PLT 327 04/16/2024   MCHC 33.2 04/16/2024   RDW 15.4 04/16/2024   LYMPHSABS 1.0 04/15/2024   MONOABS 1.0 04/15/2024   EOSABS 0.0 04/15/2024   BASOSABS 0.0 04/15/2024     Last metabolic panel Lab Results  Component Value Date   NA 138 04/16/2024   K 4.1 04/16/2024   CL 102 04/16/2024   CO2 25 04/16/2024   BUN 38 (H) 04/16/2024   CREATININE 1.16 04/16/2024   GLUCOSE 84 04/16/2024   GFRNONAA 59 (L) 04/16/2024   GFRAA >60 01/24/2018   CALCIUM  9.0 04/16/2024   PHOS 3.3 03/14/2023   PROT 6.5 04/15/2024   ALBUMIN  3.5 04/15/2024   LABGLOB 3.1 02/29/2024   AGRATIO 1.6 03/21/2016   BILITOT 0.4 04/15/2024   ALKPHOS  93 04/15/2024   AST 23 04/15/2024   ALT 26  04/15/2024   ANIONGAP 11 04/16/2024    CBG (last 3)  No results for input(s): "GLUCAP" in the last 72 hours.    Coagulation Profile: No results for input(s): "INR", "PROTIME" in the last 168 hours.   Radiology Studies: I have personally reviewed the imaging studies  CT Angio Abd/Pel W and/or Wo Contrast Result Date: 04/15/2024 CLINICAL DATA:  Right lower quadrant and right inguinal pain. Prior aortic aneurysm repair. EXAM: CTA ABDOMEN AND PELVIS WITHOUT AND WITH CONTRAST TECHNIQUE: Multidetector CT imaging of the abdomen and pelvis was performed using the standard protocol during bolus administration of intravenous contrast. Multiplanar reconstructed images and MIPs were obtained and reviewed to evaluate the vascular anatomy. RADIATION DOSE REDUCTION: This exam was performed according to the departmental dose-optimization program which includes automated exposure control, adjustment of the mA and/or kV according to patient size and/or use of iterative reconstruction technique. CONTRAST:  80mL OMNIPAQUE  IOHEXOL  350 MG/ML SOLN COMPARISON:  11/02/2023 FINDINGS: VASCULAR Aorta: Status post AAA repair with infrarenal bifurcated stent graft which is patent. No visible endoleak. Maximum AP diameter 6.1 cm compared to 6.4 cm previously. Maximum transverse diameter 5.6 cm compared to 5.8 cm previously. Streak artifact from high-density embolic material within the native aneurysm sac again noted, causing streak artifact and limited visualization in the distal sac region. Maximum diameter of the suprarenal aorta 3.4 cm, unchanged. Celiac: Patent without evidence of aneurysm, dissection, vasculitis or significant stenosis. SMA: Patent without evidence of aneurysm, dissection, vasculitis or significant stenosis. Renals: Single right renal artery and 2 left renal arteries. All 3 vessels are patent without evidence of aneurysm, dissection, vasculitis, or fibromuscular dysplasia. IMA: Fills via collaterals. Inflow:  Bifurcated stent graft extends to the distal internal iliac arteries. No evidence of endoleak. Proximal Outflow: Mild atherosclerotic calcifications. No aneurysm, dissection or significant stenosis. Veins: No obvious venous abnormality within the limitations of this arterial phase study. Review of the MIP images confirms the above findings. NON-VASCULAR Lower chest: Fibrosis in the lung bases. Mild cardiomegaly. No acute findings. Hepatobiliary: Scattered hypodensities in the liver most compatible with cysts, stable. No suspicious focal hepatic abnormality. Gallbladder unremarkable. Pancreas: No focal abnormality or ductal dilatation. Spleen: No focal abnormality.  Normal size. Adrenals/Urinary Tract: 3.8 cm midpole cyst in the right kidney, unchanged. No follow-up imaging recommended. No hydronephrosis. Adrenal glands unremarkable. Foley catheter present within the urinary bladder which is decompressed. Stomach/Bowel: Stomach, large and small bowel grossly unremarkable. No obstruction or inflammatory process. Lymphatic: No adenopathy Reproductive: Mildly prominent prostate. Penile prosthesis partially visualized. The inflation balloon in the right lower quadrant/inguinal region is collapsed, unchanged since prior study. Immediately overlying the collapse balloon and tubing leading the balloon, there is stranding in the subcutaneous soft tissues in the right inguinal/groin region. This is new since prior study. No focal fluid collection to suggest abscess. Other: No free fluid or free air. Musculoskeletal: No acute bony abnormality. IMPRESSION: VASCULAR Patent bifurcated stent graft repair of infrarenal abdominal aortic aneurysm. Maximum sac size measures up to 6.1 cm compared to 6.4 cm previously. No evidence of endoleak. NON-VASCULAR Penile prosthesis visualized with the collapsed inflation balloon in the right lower quadrant/inguinal region. The appearance is stable since prior study. There is stranding within  the subcutaneous soft tissues in the right inguinal region overlying the collapsed balloon which could reflect cellulitis. No focal fluid collection/abscess. Fibrosis in the lung bases. Mildly enlarged prostate. Electronically Signed   By: Ernestina Headland  Dover M.D.   On: 04/15/2024 19:47       Tequan Redmon M.D. [facility] 04/16/2024, 1:28 PM  Available via Epic secure chat 7am-7pm After 7 pm, please refer to night coverage provider listed on amion.

## 2024-04-17 ENCOUNTER — Other Ambulatory Visit (HOSPITAL_COMMUNITY): Payer: Self-pay

## 2024-04-17 ENCOUNTER — Telehealth (HOSPITAL_COMMUNITY): Payer: Self-pay | Admitting: Pharmacy Technician

## 2024-04-17 DIAGNOSIS — Z9889 Other specified postprocedural states: Secondary | ICD-10-CM | POA: Diagnosis not present

## 2024-04-17 DIAGNOSIS — M353 Polymyalgia rheumatica: Secondary | ICD-10-CM | POA: Diagnosis not present

## 2024-04-17 DIAGNOSIS — F419 Anxiety disorder, unspecified: Secondary | ICD-10-CM | POA: Diagnosis not present

## 2024-04-17 DIAGNOSIS — L03314 Cellulitis of groin: Secondary | ICD-10-CM | POA: Diagnosis not present

## 2024-04-17 LAB — BASIC METABOLIC PANEL WITH GFR
Anion gap: 7 (ref 5–15)
BUN: 33 mg/dL — ABNORMAL HIGH (ref 8–23)
CO2: 27 mmol/L (ref 22–32)
Calcium: 8.1 mg/dL — ABNORMAL LOW (ref 8.9–10.3)
Chloride: 99 mmol/L (ref 98–111)
Creatinine, Ser: 1.31 mg/dL — ABNORMAL HIGH (ref 0.61–1.24)
GFR, Estimated: 51 mL/min — ABNORMAL LOW (ref >60)
Glucose, Bld: 135 mg/dL — ABNORMAL HIGH (ref 70–99)
Potassium: 4 mmol/L (ref 3.5–5.1)
Sodium: 133 mmol/L — ABNORMAL LOW (ref 135–145)

## 2024-04-17 LAB — CBC
HCT: 35.9 % — ABNORMAL LOW (ref 39.0–52.0)
Hemoglobin: 12.1 g/dL — ABNORMAL LOW (ref 13.0–17.0)
MCH: 34.5 pg — ABNORMAL HIGH (ref 26.0–34.0)
MCHC: 33.7 g/dL (ref 30.0–36.0)
MCV: 102.3 fL — ABNORMAL HIGH (ref 80.0–100.0)
Platelets: 254 10*3/uL (ref 150–400)
RBC: 3.51 MIL/uL — ABNORMAL LOW (ref 4.22–5.81)
RDW: 15.6 % — ABNORMAL HIGH (ref 11.5–15.5)
WBC: 20.9 10*3/uL — ABNORMAL HIGH (ref 4.0–10.5)
nRBC: 0 % (ref 0.0–0.2)

## 2024-04-17 MED ORDER — ACYCLOVIR 400 MG PO TABS
400.0000 mg | ORAL_TABLET | Freq: Two times a day (BID) | ORAL | Status: DC
  Administered 2024-04-17 – 2024-04-18 (×3): 400 mg via ORAL
  Filled 2024-04-17 (×3): qty 1

## 2024-04-17 MED ORDER — PREDNISONE 10 MG PO TABS
12.5000 mg | ORAL_TABLET | Freq: Every day | ORAL | Status: DC
  Administered 2024-04-17: 12.5 mg via ORAL
  Filled 2024-04-17: qty 1

## 2024-04-17 MED ORDER — SODIUM CHLORIDE 0.9 % IV BOLUS
250.0000 mL | Freq: Once | INTRAVENOUS | Status: AC
Start: 2024-04-17 — End: 2024-04-17
  Administered 2024-04-17: 250 mL via INTRAVENOUS

## 2024-04-17 MED ORDER — LIDOCAINE 5 % EX PTCH: 1.0000 | MEDICATED_PATCH | CUTANEOUS | Status: DC

## 2024-04-17 MED ORDER — PREDNISONE 5 MG PO TABS
12.5000 mg | ORAL_TABLET | Freq: Every day | ORAL | Status: DC
  Administered 2024-04-18: 12.5 mg via ORAL
  Filled 2024-04-17: qty 1

## 2024-04-17 MED ORDER — FOLIC ACID 1 MG PO TABS
1.0000 mg | ORAL_TABLET | Freq: Every day | ORAL | Status: DC
  Administered 2024-04-17 – 2024-04-18 (×2): 1 mg via ORAL
  Filled 2024-04-17 (×2): qty 1

## 2024-04-17 MED ORDER — HYDROCORTISONE SOD SUC (PF) 100 MG IJ SOLR
50.0000 mg | Freq: Once | INTRAMUSCULAR | Status: AC
  Administered 2024-04-17: 50 mg via INTRAVENOUS
  Filled 2024-04-17: qty 2

## 2024-04-17 MED ORDER — LIDOCAINE 5 % EX PTCH
1.0000 | MEDICATED_PATCH | CUTANEOUS | Status: AC
  Administered 2024-04-17: 1 via TRANSDERMAL
  Filled 2024-04-17: qty 1

## 2024-04-17 NOTE — Progress Notes (Signed)
 MD bedside, pt hypotensive. BP 73/61 with MAP of 66. Asymptomatic. MD evaluated patient at bedside. Pt alert and oriented x2. Having conversation. Denies pain. Pt denies SOB or chest pain, or lightheadedness. MD ordered 250 bolus.  Repeat BP is 94/59. Medications reviewed and adjusted. Pt stable.

## 2024-04-17 NOTE — Progress Notes (Signed)
 PT Cancellation Note  Patient Details Name: [name] MRN: [id number] DOB: 08/14/1930   Cancelled Treatment:    Reason Eval/Treat Not Completed: Medical issues which prohibited therapy--low BP- RN about to give bolus. Will check back as shedule allows.    Tanda Falter, PT Acute Rehabilitation  Office: [phone]

## 2024-04-17 NOTE — Telephone Encounter (Signed)
 Patient Product/process development scientist completed.    The patient is insured through [facility]. Patient has Medicare and is not eligible for a copay card, but may be able to apply for patient assistance or Medicare RX Payment Plan (Patient Must reach out to their plan, if eligible for payment plan), if available.    Ran test claim for linezolid  600 mg and Requires Prior Authorization   This test claim was processed through [employer]- copay amounts may vary at other pharmacies due to [employer], or as the patient moves through the different stages of their insurance plan.     Morgan Arab, CPHT Pharmacy Technician III Certified Patient Advocate [facility] Pharm[facility] Patient Advocate Team Direct Number: [phone]  Fax: [phone]

## 2024-04-17 NOTE — Telephone Encounter (Signed)
 Pharmacy Patient Advocate Encounter  Received notification from [facility] that Prior Authorization for Linezolid 600MG  tablets  has been APPROVED from 04/17/2024 to 07/18/2024. Ran test claim, Copay is $67.77. This test claim was processed through [facility]- copay amounts may vary at other pharmacies due to pharmacy/plan contracts, or as the patient moves through the different stages of their insurance plan.   PA #/Case ID/Reference #: [id number]

## 2024-04-17 NOTE — Telephone Encounter (Signed)
 Pharmacy Patient Advocate Encounter   Received notification from Inpatient Request that prior authorization for Linezolid 600MG  tablets is required/requested.   Insurance verification completed.   The patient is insured through [facility] .   Per test claim: PA required; PA submitted to above mentioned insurance via CoverMyMeds Key/confirmation #/EOC [id number] Status is pending

## 2024-04-17 NOTE — Progress Notes (Addendum)
 [facility]                                                                              [name], is a 89 y.o. male, DOB - [birth year], [id number] Admit date - 04/15/2024    Outpatient Primary MD for the patient is [name], MD  LOS - 2  days  Chief Complaint  Patient presents with   Groin Pain       Brief summary   Patient is a 89 year old male with paroxysmal A-fib on Eliquis , AAA status post repair, anxiety, insomnia, iron deficiency anemia, MGUS with recent bone marrow biopsy who presents with pain and redness in the right groin. Patient has been complaining of pain in the right groin for the past 2 days.  His chronic anxiety and agitation has also worsened per report of family. WBC 21,700, and normal lactic acid.  CT demonstrates inflammatory stranding in the right groin soft tissues which could reflect cellulitis.    Assessment & Plan    Principal Problem:   Cellulitis of right groin - CT showed inflammatory stranding in the right groin soft tissues, no underlying abscess or gas on CT   - There is a penile pump reservoir immediately underlying the inflamed soft tissue on CT. Dr Brice Campi discussed with urology, Dr. Aden Agreste, who recommended conservative management with antibiotics and consideration of device extraction only if he fails conservative treatment   - Currently on IV linezolid, improving, will check copay for p.o. linezolid for 1 week -Feeling better today, no tenderness in the right groin   ILD; chronic hypoxic respiratory failure  - Stable, no wheezing  - Continue supplemental O2, outpatient pulmonology follow-up  Hypotension - Borderline BP noted, will hold metoprolol  - Normal saline 250 cc bolus - Noted that patient is on chronic prednisone  12.5 mg daily for PMR, which likely is causing adrenal insufficiency, prednisone  resumed - Continue to hold beta-blocker today.  Addendum BP still soft, will give solucortef 50mg  IV x 1, continue  home dose prednisone  12.5mg  PO daily   Paroxysmal atrial fibrillation Rate controlled -Metoprolol  placed on hold due to hypotension - Continue Eliquis     Anxiety, insomnia  - Prescription database reviewed, continue low-dose Ativan , Ambien, Remeron     Hx of DVT and PE  - Continue Eliquis      AAA  - S/p repair, appears stable on CTA    PMR  - Managed with methotrexate    Estimated body mass index is 23.42 kg/m as calculated from the following:   Height as of 02/19/24: 6' (1.829 m).   Weight as of 03/08/24: 78.3 kg.  Code Status: DNR DVT Prophylaxis:  apixaban  (ELIQUIS ) tablet 2.5 mg Start: 04/16/24 0100 apixaban  (ELIQUIS ) tablet 2.5 mg   Level of Care: Level of care: Telemetry Family Communication: Updated patient's wife on the phone  Disposition Plan:      Remains inpatient appropriate: Hopefully DC home in next 24 to 48 hours if improving   Procedures:    Consultants:   urology on phone  Antimicrobials:   Anti-infectives (From admission, onward)    Start     Dose/Rate Route Frequency  Ordered Stop   04/17/24 1200  acyclovir  (ZOVIRAX ) tablet 400 mg        400 mg Oral 2 times daily 04/17/24 1016     04/16/24 1000  linezolid (ZYVOX) IVPB 600 mg        600 mg 300 mL/hr over 60 Minutes Intravenous Every 12 hours 04/16/24 0039 04/23/24 0959   04/15/24 2045  vancomycin  (VANCOCIN ) IVPB 1000 mg/200 mL premix       Placed in "Followed by" Linked Group   1,000 mg 200 mL/hr over 60 Minutes Intravenous  Once 04/15/24 2031 04/15/24 2223   04/15/24 2045  vancomycin  (VANCOCIN ) 500 mg in sodium chloride  0.9 % 100 mL IVPB       Placed in "Followed by" Linked Group   500 mg 100 mL/hr over 60 Minutes Intravenous  Once 04/15/24 2031 04/15/24 2255   04/15/24 2030  vancomycin  (VANCOCIN ) IVPB 1000 mg/200 mL premix  Status:  Discontinued        1,000 mg 200 mL/hr over 60 Minutes Intravenous  Once 04/15/24 2024 04/15/24 2031   04/15/24 2030  ceFEPIme  (MAXIPIME ) 2 g in sodium chloride   0.9 % 100 mL IVPB        2 g 200 mL/hr over 30 Minutes Intravenous  Once 04/15/24 2024 04/15/24 2330          Medications  acyclovir   400 mg Oral BID   apixaban   2.5 mg Oral BID   Chlorhexidine  Gluconate Cloth  6 each Topical Daily   finasteride  5 mg Oral Daily   folic acid   1 mg Oral Daily   metoprolol  succinate  25 mg Oral Daily   mirtazapine  30 mg Oral QHS   pantoprazole   40 mg Oral QAC breakfast   predniSONE   12.5 mg Oral QAC breakfast   sodium chloride  flush  3 mL Intravenous Q12H   tamsulosin   0.8 mg Oral QHS      Subjective:   Demir Jump was seen and examined today.  Afebrile today, BP soft, no acute dizziness, lightheadedness, chest pain or shortness of breath.  Caregiver at the bedside.    Objective:   Vitals:   04/17/24 0606 04/17/24 0922 04/17/24 0936 04/17/24 1157  BP:  (!) 73/61 93/62 (!) 94/59  Pulse:  92  81  Resp:      Temp: 99.3 F (37.4 C) 98.6 F (37 C)    TempSrc: Oral Oral    SpO2:  100%      Intake/Output Summary (Last 24 hours) at 04/17/2024 1212 Last data filed at 04/17/2024 0400 Gross per 24 hour  Intake 540 ml  Output 675 ml  Net -135 ml     Wt Readings from Last 3 Encounters:  03/08/24 78.3 kg  02/19/24 76.4 kg  12/12/23 74.2 kg   Physical Exam General: Alert and oriented x 3, NAD Cardiovascular: S1 S2 clear, RRR.  Respiratory: CTAB, no wheezing Gastrointestinal: Soft, nontender, nondistended, NBS Ext: no pedal edema bilaterally Neuro: no new deficits Psych: Normal affect    Data Reviewed:  I have personally reviewed following labs    CBC Lab Results  Component Value Date   WBC 20.9 (H) 04/17/2024   RBC 3.51 (L) 04/17/2024   HGB 12.1 (L) 04/17/2024   HCT 35.9 (L) 04/17/2024   MCV 102.3 (H) 04/17/2024   MCH 34.5 (H) 04/17/2024   PLT 254 04/17/2024   MCHC 33.7 04/17/2024   RDW 15.6 (H) 04/17/2024   LYMPHSABS 1.0 04/15/2024   MONOABS 1.0 04/15/2024  EOSABS 0.0 04/15/2024   BASOSABS 0.0 04/15/2024      Last metabolic panel Lab Results  Component Value Date   NA 133 (L) 04/17/2024   K 4.0 04/17/2024   CL 99 04/17/2024   CO2 27 04/17/2024   BUN 33 (H) 04/17/2024   CREATININE 1.31 (H) 04/17/2024   GLUCOSE 135 (H) 04/17/2024   GFRNONAA 51 (L) 04/17/2024   GFRAA >60 01/24/2018   CALCIUM  8.1 (L) 04/17/2024   PHOS 3.3 03/14/2023   PROT 6.5 04/15/2024   ALBUMIN  3.5 04/15/2024   LABGLOB 3.1 02/29/2024   AGRATIO 1.6 03/21/2016   BILITOT 0.4 04/15/2024   ALKPHOS 93 04/15/2024   AST 23 04/15/2024   ALT 26 04/15/2024   ANIONGAP 7 04/17/2024    CBG (last 3)  No results for input(s): "GLUCAP" in the last 72 hours.    Coagulation Profile: No results for input(s): "INR", "PROTIME" in the last 168 hours.   Radiology Studies: I have personally reviewed the imaging studies  CT Angio Abd/Pel W and/or Wo Contrast Result Date: 04/15/2024 CLINICAL DATA:  Right lower quadrant and right inguinal pain. Prior aortic aneurysm repair. EXAM: CTA ABDOMEN AND PELVIS WITHOUT AND WITH CONTRAST TECHNIQUE: Multidetector CT imaging of the abdomen and pelvis was performed using the standard protocol during bolus administration of intravenous contrast. Multiplanar reconstructed images and MIPs were obtained and reviewed to evaluate the vascular anatomy. RADIATION DOSE REDUCTION: This exam was performed according to the departmental dose-optimization program which includes automated exposure control, adjustment of the mA and/or kV according to patient size and/or use of iterative reconstruction technique. CONTRAST:  80mL OMNIPAQUE  IOHEXOL  350 MG/ML SOLN COMPARISON:  11/02/2023 FINDINGS: VASCULAR Aorta: Status post AAA repair with infrarenal bifurcated stent graft which is patent. No visible endoleak. Maximum AP diameter 6.1 cm compared to 6.4 cm previously. Maximum transverse diameter 5.6 cm compared to 5.8 cm previously. Streak artifact from high-density embolic material within the native aneurysm sac again  noted, causing streak artifact and limited visualization in the distal sac region. Maximum diameter of the suprarenal aorta 3.4 cm, unchanged. Celiac: Patent without evidence of aneurysm, dissection, vasculitis or significant stenosis. SMA: Patent without evidence of aneurysm, dissection, vasculitis or significant stenosis. Renals: Single right renal artery and 2 left renal arteries. All 3 vessels are patent without evidence of aneurysm, dissection, vasculitis, or fibromuscular dysplasia. IMA: Fills via collaterals. Inflow: Bifurcated stent graft extends to the distal internal iliac arteries. No evidence of endoleak. Proximal Outflow: Mild atherosclerotic calcifications. No aneurysm, dissection or significant stenosis. Veins: No obvious venous abnormality within the limitations of this arterial phase study. Review of the MIP images confirms the above findings. NON-VASCULAR Lower chest: Fibrosis in the lung bases. Mild cardiomegaly. No acute findings. Hepatobiliary: Scattered hypodensities in the liver most compatible with cysts, stable. No suspicious focal hepatic abnormality. Gallbladder unremarkable. Pancreas: No focal abnormality or ductal dilatation. Spleen: No focal abnormality.  Normal size. Adrenals/Urinary Tract: 3.8 cm midpole cyst in the right kidney, unchanged. No follow-up imaging recommended. No hydronephrosis. Adrenal glands unremarkable. Foley catheter present within the urinary bladder which is decompressed. Stomach/Bowel: Stomach, large and small bowel grossly unremarkable. No obstruction or inflammatory process. Lymphatic: No adenopathy Reproductive: Mildly prominent prostate. Penile prosthesis partially visualized. The inflation balloon in the right lower quadrant/inguinal region is collapsed, unchanged since prior study. Immediately overlying the collapse balloon and tubing leading the balloon, there is stranding in the subcutaneous soft tissues in the right inguinal/groin region. This is new  since prior study.  No focal fluid collection to suggest abscess. Other: No free fluid or free air. Musculoskeletal: No acute bony abnormality. IMPRESSION: VASCULAR Patent bifurcated stent graft repair of infrarenal abdominal aortic aneurysm. Maximum sac size measures up to 6.1 cm compared to 6.4 cm previously. No evidence of endoleak. NON-VASCULAR Penile prosthesis visualized with the collapsed inflation balloon in the right lower quadrant/inguinal region. The appearance is stable since prior study. There is stranding within the subcutaneous soft tissues in the right inguinal region overlying the collapsed balloon which could reflect cellulitis. No focal fluid collection/abscess. Fibrosis in the lung bases. Mildly enlarged prostate. Electronically Signed   By: Janeece Mechanic M.D.   On: 04/15/2024 19:47       Dmarion Perfect M.D. [facility] 04/17/2024, 12:12 PM  Available via Epic secure chat 7am-7pm After 7 pm, please refer to night coverage provider listed on amion.

## 2024-04-17 NOTE — Plan of Care (Signed)
  Problem: Clinical Measurements: Goal: Respiratory complications will improve Outcome: Progressing   Problem: Nutrition: Goal: Adequate nutrition will be maintained Outcome: Progressing   Problem: Safety: Goal: Ability to remain free from injury will improve Outcome: Progressing   Problem: Skin Integrity: Goal: Risk for impaired skin integrity will decrease Outcome: Progressing   

## 2024-04-18 ENCOUNTER — Other Ambulatory Visit (HOSPITAL_COMMUNITY): Payer: Self-pay

## 2024-04-18 DIAGNOSIS — M353 Polymyalgia rheumatica: Secondary | ICD-10-CM | POA: Diagnosis not present

## 2024-04-18 DIAGNOSIS — F419 Anxiety disorder, unspecified: Secondary | ICD-10-CM | POA: Diagnosis not present

## 2024-04-18 DIAGNOSIS — L03314 Cellulitis of groin: Secondary | ICD-10-CM | POA: Diagnosis not present

## 2024-04-18 DIAGNOSIS — I5032 Chronic diastolic (congestive) heart failure: Secondary | ICD-10-CM | POA: Diagnosis not present

## 2024-04-18 LAB — BASIC METABOLIC PANEL WITH GFR
Anion gap: 6 (ref 5–15)
BUN: 32 mg/dL — ABNORMAL HIGH (ref 8–23)
CO2: 27 mmol/L (ref 22–32)
Calcium: 8 mg/dL — ABNORMAL LOW (ref 8.9–10.3)
Chloride: 100 mmol/L (ref 98–111)
Creatinine, Ser: 1.16 mg/dL (ref 0.61–1.24)
GFR, Estimated: 59 mL/min — ABNORMAL LOW (ref >60)
Glucose, Bld: 131 mg/dL — ABNORMAL HIGH (ref 70–99)
Potassium: 4.2 mmol/L (ref 3.5–5.1)
Sodium: 133 mmol/L — ABNORMAL LOW (ref 135–145)

## 2024-04-18 LAB — CBC
HCT: 34.9 % — ABNORMAL LOW (ref 39.0–52.0)
Hemoglobin: 11.3 g/dL — ABNORMAL LOW (ref 13.0–17.0)
MCH: 34.1 pg — ABNORMAL HIGH (ref 26.0–34.0)
MCHC: 32.4 g/dL (ref 30.0–36.0)
MCV: 105.4 fL — ABNORMAL HIGH (ref 80.0–100.0)
Platelets: 232 10*3/uL (ref 150–400)
RBC: 3.31 MIL/uL — ABNORMAL LOW (ref 4.22–5.81)
RDW: 15.3 % (ref 11.5–15.5)
WBC: 16.2 10*3/uL — ABNORMAL HIGH (ref 4.0–10.5)
nRBC: 0 % (ref 0.0–0.2)

## 2024-04-18 MED ORDER — LINEZOLID 600 MG PO TABS
600.0000 mg | ORAL_TABLET | Freq: Two times a day (BID) | ORAL | 0 refills | Status: AC
  Filled 2024-04-18: qty 10, 5d supply, fill #0

## 2024-04-18 MED ORDER — METOPROLOL SUCCINATE ER 25 MG PO TB24: 25.0000 mg | ORAL_TABLET | Freq: Every day | ORAL | Status: DC

## 2024-04-18 MED ORDER — PANTOPRAZOLE SODIUM 40 MG PO TBEC
40.0000 mg | DELAYED_RELEASE_TABLET | Freq: Every day | ORAL | 3 refills | Status: DC
  Filled 2024-04-18: qty 30, 30d supply, fill #0

## 2024-04-18 MED ORDER — SENNOSIDES-DOCUSATE SODIUM 8.6-50 MG PO TABS
1.0000 | ORAL_TABLET | Freq: Two times a day (BID) | ORAL | Status: DC
  Filled 2024-04-18: qty 1

## 2024-04-18 NOTE — Progress Notes (Signed)
 Discharge medications delivered to patient at bedside [facility]

## 2024-04-18 NOTE — Evaluation (Signed)
 Physical Therapy One Time Evaluation Patient Details Name: [name] MRN: [id number] DOB: [birth year] Today's Date: 04/18/2024  History of Present Illness  Randall "[name]"  Alexander is a 89 yo male admitted to [facility] with cellulitis R groin. PMH: ILD, Hypotension, paroxysmal Afib, anxiety, insomnia  Clinical Impression  Patient evaluated by Physical Therapy with no further acute PT needs identified. All education has been completed and the patient has no further questions.  Pt reports d/c home today but agreeable to mobilize.  Pt assisted with ambulating in hallway and required seated rest break due to fatigue.  Pt appears to have DME and good support at home.  Pt may benefit from HHPT prior to return to OPPT.  PT is signing off. Thank you for this referral.         If plan is discharge home, recommend the following: A little help with walking and/or transfers;A little help with bathing/dressing/bathroom;Help with stairs or ramp for entrance;Assistance with cooking/housework   Can travel by private vehicle        Equipment Recommendations None recommended by PT  Recommendations for Other Services       Functional Status Assessment Patient has had a recent decline in their functional status and demonstrates the ability to make significant improvements in function in a reasonable and predictable amount of time.     Precautions / Restrictions Precautions Precautions: Fall Recall of Precautions/Restrictions: Impaired      Mobility  Bed Mobility               General bed mobility comments: pt in recliner    Transfers Overall transfer level: Needs assistance Equipment used: Rolling walker (2 wheels) Transfers: Sit to/from Stand Sit to Stand: Min assist           General transfer comment: verbal cues for hand placement; reliant on UE support; assist for rest break in hallway chair    Ambulation/Gait Ambulation/Gait assistance: Contact guard assist Gait Distance (Feet):  160 Feet (x2) Assistive device: Rolling walker (2 wheels) Gait Pattern/deviations: Step-through pattern, Decreased stride length, Trunk flexed Gait velocity: decr     General Gait Details: slow but steady with RW; required seated rest break due to fatigue (160 ft x2)  Stairs            Wheelchair Mobility     Tilt Bed    Modified Rankin (Stroke Patients Only)       Balance                                             Pertinent Vitals/Pain Pain Assessment Pain Assessment: No/denies pain    Home Living Family/patient expects to be discharged to:: Private residence Living Arrangements: Spouse/significant other Available Help at Discharge: Personal care attendant;Family Type of Home: House Home Access: Stairs to enter   [employer] of Steps: 1 Alternate Level Stairs-Number of Steps: FF Home Layout: Multi-level;Able to live on main level with bedroom/bathroom Home Equipment: Rolling Walker (2 wheels);Rollator (4 wheels);BSC/3in1;Shower seat;Grab bars - tub/shower;Hand held shower head;Wheelchair - manual Additional Comments: was amb without AD and attending OPPT    Prior Function Prior Level of Function : Needs assist;Working/employed  Cognitive Assist : Mobility (cognitive);ADLs (cognitive) Mobility (Cognitive): Set up cues ADLs (Cognitive): Set up cues Physical Assist : Mobility (physical);ADLs (physical) Mobility (physical): Stairs;Gait ADLs (physical): IADLs;Bathing Mobility Comments: occasional use of AD when  unsteady, remains working some as Teacher, English as a foreign language of [employer] ADLs Comments: some support from PCA and wife for BADL's and transportation, Foley catheter management     Extremity/Trunk Assessment        Lower Extremity Assessment Lower Extremity Assessment: Generalized weakness       Communication   Communication Communication: No apparent difficulties    Cognition Arousal: Alert Behavior During Therapy: WFL for  tasks assessed/performed                               Following commands impaired: Follows one step commands with increased time     Cueing Cueing Techniques: Verbal cues, Visual cues     General Comments      Exercises     Assessment/Plan    PT Assessment All further PT needs can be met in the next venue of care  PT Problem List Decreased strength;Decreased activity tolerance;Decreased balance;Decreased mobility;Decreased knowledge of use of DME       PT Treatment Interventions      PT Goals (Current goals can be found in the Care Plan section)  Acute Rehab PT Goals PT Goal Formulation: All assessment and education complete, DC therapy Time For Goal Achievement: 04/25/24    Frequency       Co-evaluation               AM-PAC PT "6 Clicks" Mobility  Outcome Measure Help needed turning from your back to your side while in a flat bed without using bedrails?: A Little Help needed moving from lying on your back to sitting on the side of a flat bed without using bedrails?: A Little Help needed moving to and from a bed to a chair (including a wheelchair)?: A Little Help needed standing up from a chair using your arms (e.g., wheelchair or bedside chair)?: A Little Help needed to walk in hospital room?: A Little Help needed climbing 3-5 steps with a railing? : A Lot 6 Click Score: 17    End of Session Equipment Utilized During Treatment: Gait belt Activity Tolerance: Patient tolerated treatment well Patient left: in chair;with call bell/phone within reach;with family/visitor present;with chair alarm set Nurse Communication: Mobility status PT Visit Diagnosis: Difficulty in walking, not elsewhere classified (R26.2)    Time: 1610-9604 PT Time Calculation (min) (ACUTE ONLY): 23 min   Charges:   PT Evaluation $PT Eval Low Complexity: 1 Low   PT General Charges $$ ACUTE PT VISIT: 1 Visit       Henretta Lodge PT, DPT Physical Therapist Acute Rehabilitation  Services Office: [phone]   Myna Asal Payson 04/18/2024, 4:01 PM

## 2024-04-18 NOTE — Progress Notes (Signed)
 Pt removed from  tele- central tele called and notified of pt d/c to home- waiting on wife to return with clothes.

## 2024-04-18 NOTE — Discharge Summary (Signed)
 Physician Discharge Summary   Patient: [name] MRN: [id number] DOB: August 27, 1930  Admit date:     04/15/2024  Discharge date: 04/18/24  Discharge Physician: Bertram Brocks, MD    PCP: [name], MD   Recommendations at discharge:   Continue linezolid 600 mg p.o. twice daily for 5 more days to complete full course  Discharge Diagnoses: :   Cellulitis of right groin   PMR (polymyalgia rheumatica) (HCC)   S/P AAA repair   Chronic respiratory failure with hypoxia (HCC)   Chronic diastolic CHF (congestive heart failure) (HCC)   Pulmonary embolism (HCC)   Insomnia   Anxiety   PAF (paroxysmal atrial fibrillation) (HCC)   ILD (interstitial lung disease) [facility])   Hospital Course:  Patient is a 89 year old male with paroxysmal A-fib on Eliquis , AAA status post repair, anxiety, insomnia, iron deficiency anemia, MGUS with recent bone marrow biopsy who presents with pain and redness in the right groin. Patient has been complaining of pain in the right groin for the past 2 days.  His chronic anxiety and agitation has also worsened per report of family. WBC 21,700, and normal lactic acid.  CT demonstrates inflammatory stranding in the right groin soft tissues which could reflect cellulitis.  Assessment and Plan:  Cellulitis of right groin - CT showed inflammatory stranding in the right groin soft tissues, no underlying abscess or gas on CT   - There is a penile pump reservoir immediately underlying the inflamed soft tissue on CT. Dr Brice Campi discussed with urology, Dr. Aden Agreste, who recommended conservative management with antibiotics and consideration of device extraction only if he fails conservative treatment   - Patient was placed on IV linezolid, overall improving, right groin pain, faint redness has resolved.  - Complete full course of linezolid, continue p.o. linezolid for 5 days.     ILD; chronic hypoxic respiratory failure  - Stable, no wheezing  - Continue supplemental O2, outpatient  pulmonology follow-up   Hypotension, likely due to relative adrenal insufficiency - Borderline BP noted on 5/21, metoprolol  was held, received 250 cc normal saline bolus  - Noted that patient is on chronic prednisone  12.5 mg daily for PMR, which likely is causing adrenal insufficiency, prednisone  was resumed and received solucortef 50mg  IV x 1 dose   Paroxysmal atrial fibrillation Rate controlled -Metoprolol  was held due to hypotension.  Now improving, may resume tomorrow. - Continue Eliquis     Anxiety, insomnia  - Prescription database reviewed, continue low-dose Ativan , Ambien, Remeron     Hx of DVT and PE  - Continue Eliquis      AAA  - S/p repair, appears stable on CTA    PMR  - Managed with methotrexate     Estimated body mass index is 23.42 kg/m as calculated from the following:   Height as of 02/19/24: 6' (1.829 m).   Weight as of 03/08/24: 78.3 kg.        Pain control - [place]  Controlled Substance Reporting System database was reviewed. and patient was instructed, not to drive, operate heavy machinery, perform activities at heights, swimming or participation in water activities or provide baby-sitting services while on Pain, Sleep and Anxiety Medications; until their outpatient Physician has advised to do so again. Also recommended to not to take more than prescribed Pain, Sleep and Anxiety Medications.  Consultants: None Procedures performed:   Disposition: Home Diet recommendation:  Discharge Diet Orders (From admission, onward)     Start     Ordered   04/18/24 0000  Diet general        04/18/24 1049            DISCHARGE MEDICATION: Allergies as of 04/18/2024       Reactions   Daptomycin  Other (See Comments)   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   Rosuvastatin  Other (See Comments)   Stopped taking due to feeling achy     Codeine Nausea And Vomiting   Morphine  And Codeine Nausea And Vomiting   Omnicef [cefdinir] Other (See Comments)   Abdominal  pain        Medication List     TAKE these medications    acetaminophen  325 MG tablet Commonly known as: TYLENOL  Take 2 tablets (650 mg total) by mouth every 6 (six) hours as needed for mild pain (or Fever >/= 101).   acyclovir  400 MG tablet Commonly known as: ZOVIRAX  Take 400 mg by mouth 2 (two) times daily.   Ambien 10 MG tablet Generic drug: zolpidem Take 5 mg by mouth at bedtime as needed for sleep.   zolpidem 5 MG tablet Commonly known as: AMBIEN Take 5 mg by mouth at bedtime as needed.   apixaban  2.5 MG Tabs tablet Commonly known as: ELIQUIS  Take 2.5 mg by mouth 2 (two) times daily.   bisacodyl 5 MG EC tablet Take 10 mg by mouth daily as needed for moderate constipation.   butalbital -acetaminophen -caffeine  50-325-40 MG tablet Commonly known as: FIORICET  Take 1 tablet by mouth daily as needed for headache.   cholecalciferol 25 MCG (1000 UNIT) tablet Commonly known as: VITAMIN D3 Take 2,000 Units by mouth daily.   Colace 100 MG capsule Generic drug: docusate sodium  Take 100 mg by mouth daily.   diclofenac  Sodium 1 % Gel Commonly known as: VOLTAREN  Apply 2 g topically 4 (four) times daily as needed (shoulder pain).   feeding supplement Liqd Take 237 mLs by mouth 3 (three) times daily between meals.   finasteride 5 MG tablet Commonly known as: PROSCAR Take 5 mg by mouth daily.   fluorometholone  0.1 % ophthalmic suspension Commonly known as: FML Place 1 drop into the right eye at bedtime.   folic acid  1 MG tablet Commonly known as: FOLVITE  Take 1 mg by mouth daily.   lansoprazole 30 MG capsule Commonly known as: PREVACID Take 30 mg by mouth 2 (two) times daily.   linezolid 600 MG tablet Commonly known as: ZYVOX Take 1 tablet (600 mg total) by mouth 2 (two) times daily for 5 days.   LORazepam  0.5 MG tablet Commonly known as: ATIVAN  Take 0.5 mg by mouth daily. 0.25 am, 0.25 pm,  0.5 at night   magnesium  hydroxide 400 MG/5ML  suspension Commonly known as: MILK OF MAGNESIA Take by mouth daily as needed for mild constipation.   melatonin 5 MG Tabs Take 1 tablet (5 mg total) by mouth at bedtime.   methotrexate 2.5 MG tablet Commonly known as: RHEUMATREX Take 12.5 mg by mouth once a week.   metoprolol  succinate 25 MG 24 hr tablet Commonly known as: TOPROL -XL Take 1 tablet (25 mg total) by mouth daily. Start taking on: Apr 19, 2024   mirtazapine 15 MG tablet Commonly known as: REMERON Take 15 mg by mouth at bedtime. Pt taking 30 mg   MULTIVITAMIN PO Take 1 tablet by mouth daily.   ondansetron  4 MG tablet Commonly known as: ZOFRAN  Take 1 tablet (4 mg total) by mouth every 4 (four) hours as needed for nausea or vomiting.   pantoprazole  40 MG tablet Commonly known as:  PROTONIX  Take 1 tablet (40 mg total) by mouth daily before breakfast. Start taking on: Apr 19, 2024   polyethylene glycol 17 g packet Commonly known as: MIRALAX / GLYCOLAX Take 17 g by mouth 2 (two) times daily.   predniSONE  2.5 MG tablet Commonly known as: DELTASONE  Take 2.5 mg by mouth daily with breakfast.   predniSONE  10 MG tablet Commonly known as: DELTASONE  Take 10 mg by mouth daily with breakfast.   promethazine  12.5 MG tablet Commonly known as: PHENERGAN  Take 1 tablet (12.5 mg total) by mouth daily as needed for nausea or vomiting.   psyllium 0.52 g capsule Commonly known as: REGULOID Take 0.52 g by mouth daily.   Senna-S 8.6-50 MG tablet Generic drug: senna-docusate Take 1 tablet by mouth daily.   tamsulosin  0.4 MG Caps capsule Commonly known as: FLOMAX  Take 0.4 mg by mouth at bedtime. PT TAKES 2 CAPSULES AT HS   Tart Cherry 1200 MG Caps Take 1 tablet by mouth daily.   traMADol  50 MG tablet Commonly known as: ULTRAM  Take 50 mg by mouth every 4 (four) hours as needed for moderate pain or severe pain.        Follow-up Information     [name], MD. Schedule an appointment as soon as possible for a  visit in 2 week(s).   Specialty: Internal Medicine Why: for hospital follow-up Contact information: [address] [place] [address] [phone]                Discharge Exam: Cleavon Curls Weights   04/17/24 1900  Weight: 78.3 kg   S: No acute complaints, worked with PT today.  Now sitting up in the recliner without any difficulty.  Wants to go home.  Private caregiver at the bedside.  BP 105/68 (BP Location: Right Arm)   Pulse 80   Temp 97.6 F (36.4 C) (Oral)   Resp 20   Ht 6' (1.829 m)   Wt 78.3 kg   SpO2 98%   BMI 23.41 kg/m   Physical Exam General: Alert and oriented x 3, NAD Cardiovascular: S1 S2 clear, RRR.  Respiratory: CTAB, no wheezing Gastrointestinal: Soft, nontender, nondistended, NBS Ext: no pedal edema bilaterally Neuro: no new deficits Psych: Normal affect    Condition at discharge: fair  The results of significant diagnostics from this hospitalization (including imaging, microbiology, ancillary and laboratory) are listed below for reference.   Imaging Studies: CT Angio Abd/Pel W and/or Wo Contrast Result Date: 04/15/2024 CLINICAL DATA:  Right lower quadrant and right inguinal pain. Prior aortic aneurysm repair. EXAM: CTA ABDOMEN AND PELVIS WITHOUT AND WITH CONTRAST TECHNIQUE: Multidetector CT imaging of the abdomen and pelvis was performed using the standard protocol during bolus administration of intravenous contrast. Multiplanar reconstructed images and MIPs were obtained and reviewed to evaluate the vascular anatomy. RADIATION DOSE REDUCTION: This exam was performed according to the departmental dose-optimization program which includes automated exposure control, adjustment of the mA and/or kV according to patient size and/or use of iterative reconstruction technique. CONTRAST:  80mL OMNIPAQUE  IOHEXOL  350 MG/ML SOLN COMPARISON:  11/02/2023 FINDINGS: VASCULAR Aorta: Status post AAA repair with infrarenal bifurcated stent graft which is patent. No  visible endoleak. Maximum AP diameter 6.1 cm compared to 6.4 cm previously. Maximum transverse diameter 5.6 cm compared to 5.8 cm previously. Streak artifact from high-density embolic material within the native aneurysm sac again noted, causing streak artifact and limited visualization in the distal sac region. Maximum diameter of the suprarenal aorta 3.4 cm, unchanged. Celiac:  Patent without evidence of aneurysm, dissection, vasculitis or significant stenosis. SMA: Patent without evidence of aneurysm, dissection, vasculitis or significant stenosis. Renals: Single right renal artery and 2 left renal arteries. All 3 vessels are patent without evidence of aneurysm, dissection, vasculitis, or fibromuscular dysplasia. IMA: Fills via collaterals. Inflow: Bifurcated stent graft extends to the distal internal iliac arteries. No evidence of endoleak. Proximal Outflow: Mild atherosclerotic calcifications. No aneurysm, dissection or significant stenosis. Veins: No obvious venous abnormality within the limitations of this arterial phase study. Review of the MIP images confirms the above findings. NON-VASCULAR Lower chest: Fibrosis in the lung bases. Mild cardiomegaly. No acute findings. Hepatobiliary: Scattered hypodensities in the liver most compatible with cysts, stable. No suspicious focal hepatic abnormality. Gallbladder unremarkable. Pancreas: No focal abnormality or ductal dilatation. Spleen: No focal abnormality.  Normal size. Adrenals/Urinary Tract: 3.8 cm midpole cyst in the right kidney, unchanged. No follow-up imaging recommended. No hydronephrosis. Adrenal glands unremarkable. Foley catheter present within the urinary bladder which is decompressed. Stomach/Bowel: Stomach, large and small bowel grossly unremarkable. No obstruction or inflammatory process. Lymphatic: No adenopathy Reproductive: Mildly prominent prostate. Penile prosthesis partially visualized. The inflation balloon in the right lower  quadrant/inguinal region is collapsed, unchanged since prior study. Immediately overlying the collapse balloon and tubing leading the balloon, there is stranding in the subcutaneous soft tissues in the right inguinal/groin region. This is new since prior study. No focal fluid collection to suggest abscess. Other: No free fluid or free air. Musculoskeletal: No acute bony abnormality. IMPRESSION: VASCULAR Patent bifurcated stent graft repair of infrarenal abdominal aortic aneurysm. Maximum sac size measures up to 6.1 cm compared to 6.4 cm previously. No evidence of endoleak. NON-VASCULAR Penile prosthesis visualized with the collapsed inflation balloon in the right lower quadrant/inguinal region. The appearance is stable since prior study. There is stranding within the subcutaneous soft tissues in the right inguinal region overlying the collapsed balloon which could reflect cellulitis. No focal fluid collection/abscess. Fibrosis in the lung bases. Mildly enlarged prostate. Electronically Signed   By: Janeece Mechanic M.D.   On: 04/15/2024 19:47   CT BONE MARROW BIOPSY & ASPIRATION Result Date: 04/09/2024 CLINICAL DATA:  Monoclonal gammopathy unknown significance and need for bone marrow biopsy. EXAM: CT GUIDED BONE MARROW ASPIRATION AND BIOPSY ANESTHESIA/SEDATION: Moderate (conscious) sedation was employed during this procedure. A total of Versed  2.0 mg and Fentanyl  100 mcg was administered intravenously. Moderate Sedation Time: 10 minutes. The patient's level of consciousness and vital signs were monitored continuously by radiology nursing throughout the procedure under my direct supervision. PROCEDURE: The procedure risks, benefits, and alternatives were explained to the patient. Questions regarding the procedure were encouraged and answered. The patient understands and consents to the procedure. A time out was performed prior to initiating the procedure. The right gluteal region was prepped with chlorhexidine .  Sterile gown and sterile gloves were used for the procedure. Local anesthesia was provided with 1% Lidocaine . Under CT guidance, an 11 gauge On Control bone cutting needle was advanced from a posterior approach into the right iliac bone. Needle positioning was confirmed with CT. Initial non heparinized and heparinized aspirate samples were obtained of bone marrow. Core biopsy was performed via the On Control drill needle. COMPLICATIONS: None FINDINGS: Inspection of initial aspirate did reveal visible particles. Intact core biopsy sample was obtained. IMPRESSION: CT guided bone marrow biopsy of right posterior iliac bone with both aspirate and core samples obtained. Electronically Signed   By: Erica Hau M.D.   On: 04/09/2024  14:23    Microbiology: Results for orders placed or performed during the hospital encounter of 03/05/23  Culture, blood (routine x 2)     Status: None   Collection Time: 03/05/23  4:14 PM   Specimen: BLOOD  Result Value Ref Range Status   Specimen Description BLOOD SITE NOT SPECIFIED  Final   Special Requests   Final    BOTTLES DRAWN AEROBIC AND ANAEROBIC Blood Culture adequate volume   Culture   Final    NO GROWTH 5 DAYS Performed at [facility], [address], [place], [place] [address]    Report Status 03/10/2023 FINAL  Final  Resp panel by RT-PCR (RSV, Flu A&B, Covid) Anterior Nasal Swab     Status: None   Collection Time: 03/05/23  4:29 PM   Specimen: Anterior Nasal Swab  Result Value Ref Range Status   SARS Coronavirus 2 by RT PCR NEGATIVE NEGATIVE Final   Influenza A by PCR NEGATIVE NEGATIVE Final   Influenza B by PCR NEGATIVE NEGATIVE Final    Comment: (NOTE) The Xpert Xpress SARS-CoV-2/FLU/RSV plus assay is intended as an aid in the diagnosis of influenza from Nasopharyngeal swab specimens and should not be used as a sole basis for treatment. Nasal washings and aspirates are unacceptable for Xpert Xpress SARS-CoV-2/FLU/RSV testing.  Fact Sheet  for Patients: BloggerCourse.com  Fact Sheet for Healthcare Providers: SeriousBroker.it  This test is not yet approved or cleared by the [place]  FDA and has been authorized for detection and/or diagnosis of SARS-CoV-2 by FDA under an Emergency Use Authorization (EUA). This EUA will remain in effect (meaning this test can be used) for the duration of the COVID-19 declaration under Section 564(b)(1) of the Act, 21 U.S.C. section 360bbb-3(b)(1), unless the authorization is terminated or revoked.     Resp Syncytial Virus by PCR NEGATIVE NEGATIVE Final    Comment: (NOTE) Fact Sheet for Patients: BloggerCourse.com  Fact Sheet for Healthcare Providers: SeriousBroker.it  This test is not yet approved or cleared by the [place]  FDA and has been authorized for detection and/or diagnosis of SARS-CoV-2 by FDA under an Emergency Use Authorization (EUA). This EUA will remain in effect (meaning this test can be used) for the duration of the COVID-19 declaration under Section 564(b)(1) of the Act, 21 U.S.C. section 360bbb-3(b)(1), unless the authorization is terminated or revoked.  Performed at [facility], [address], [place], [place] [address]   Culture, blood (routine x 2)     Status: None   Collection Time: 03/05/23  4:33 PM   Specimen: BLOOD LEFT FOREARM  Result Value Ref Range Status   Specimen Description BLOOD LEFT FOREARM  Final   Special Requests   Final    BOTTLES DRAWN AEROBIC AND ANAEROBIC Blood Culture results may not be optimal due to an excessive volume of blood received in culture bottles   Culture   Final    NO GROWTH 5 DAYS Performed at [facility], [address] [place], [place] [address]    Report Status 03/10/2023 FINAL  Final  MRSA Next Gen by PCR, Nasal     Status: None   Collection Time: 03/06/23  8:34 PM   Specimen: Nasal Mucosa;  Nasal Swab  Result Value Ref Range Status   MRSA by PCR Next Gen NOT DETECTED NOT DETECTED Final    Comment: (NOTE) The GeneXpert MRSA Assay (FDA approved for NASAL specimens only), is one component of a comprehensive MRSA colonization surveillance program.  It is not intended to diagnose MRSA infection nor to guide or monitor treatment for MRSA infections. Test performance is not FDA approved in patients less than 54 years old. Performed at [facility], [address], [place], [place] [address]     Labs: CBC: Recent Labs  Lab 04/15/24 1712 04/16/24 0148 04/17/24 0303 04/18/24 0553  WBC 21.7* 21.6* 20.9* 16.2*  NEUTROABS 19.5*  --   --   --   HGB 12.3* 12.1* 12.1* 11.3*  HCT 37.2* 36.5* 35.9* 34.9*  MCV 102.8* 102.8* 102.3* 105.4*  PLT 323 327 254 232   Basic Metabolic Panel: Recent Labs  Lab 04/15/24 1712 04/16/24 0148 04/17/24 0303 04/18/24 0553  NA 137 138 133* 133*  K 4.8 4.1 4.0 4.2  CL 101 102 99 100  CO2 21* 25 27 27   GLUCOSE 147* 84 135* 131*  BUN 43* 38* 33* 32*  CREATININE 1.02 1.16 1.31* 1.16  CALCIUM  9.6 9.0 8.1* 8.0*   Liver Function Tests: Recent Labs  Lab 04/15/24 1712  AST 23  ALT 26  ALKPHOS 93  BILITOT 0.4  PROT 6.5  ALBUMIN  3.5   CBG: No results for input(s): "GLUCAP" in the last 168 hours.  Discharge time spent: greater than 30 minutes.  Signed: Bertram Brocks, MD [facility] 04/18/2024

## 2024-04-18 NOTE — Plan of Care (Signed)
  Problem: Education: Goal: Knowledge of General Education information will improve Description: Including pain rating scale, medication(s)/side effects and non-pharmacologic comfort measures Outcome: Progressing   Problem: Clinical Measurements: Goal: Ability to maintain clinical measurements within normal limits will improve Outcome: Progressing Goal: Will remain free from infection Outcome: Progressing Goal: Diagnostic test results will improve Outcome: Progressing   Problem: Activity: Goal: Risk for activity intolerance will decrease Outcome: Progressing   Problem: Nutrition: Goal: Adequate nutrition will be maintained Outcome: Progressing   Problem: Elimination: Goal: Will not experience complications related to urinary retention Outcome: Progressing   Problem: Safety: Goal: Ability to remain free from injury will improve Outcome: Progressing

## 2024-04-18 NOTE — Evaluation (Addendum)
 Occupational Therapy Evaluation Patient Details Name: [name] MRN: [id number] DOB: [birth year] Today's Date: 04/18/2024   History of Present Illness   Randall "[name]"  Fina is a 89 yo male admitted to [facility] with cellulitis R groin. PMH: ILD, Hypotension, paroxysmal Afib, anxiety, insomnia     Clinical Impressions PTA, patient was living at home with wife and PCA care daily requiring occasional use of AD for mobility if unsteady but amb most of time with Supervision attending OPPT and completing BADL's with mod I except shower Supervision, assist to manage permanent Foley catheter with assist for IADL's. Patient continues to work as Teacher, English as a foreign language for [employer]. Currently. Patient presents with deficits outlined below (see OT Problem List for details) most significantly mild cog/processing delay, standing balance and activity tolerance impacting functional ambulation (CGA and cues) and BADL's (overall min A-CGA with cues). Patient requires ongoing Acute care level OT services to progress functional level and safety. Anticipate patient will be able to return home with family and PCA support and assistance with [facility] services. PCA reports patient has all necessary DME in home.   *BP remained stable during this am OT session with end reading 105/68, HR 81, SpO2 98% on RA    If plan is discharge home, recommend the following:   A little help with walking and/or transfers;A little help with bathing/dressing/bathroom;Assistance with cooking/housework;Assistance with feeding;Direct supervision/assist for medications management;Direct supervision/assist for financial management;Assist for transportation;Help with stairs or ramp for entrance;Supervision due to cognitive status     Functional Status Assessment   Patient has had a recent decline in their functional status and demonstrates the ability to make significant improvements in function in a reasonable and predictable amount of time.      Equipment Recommendations   None recommended by OT      Precautions/Restrictions   Precautions Precautions: Fall Recall of Precautions/Restrictions: Impaired Restrictions Weight Bearing Restrictions Per Provider Order: No Other Position/Activity Restrictions: watch BP     Mobility Bed Mobility Overal bed mobility: Needs Assistance Bed Mobility: Supine to Sit     Supine to sit: Contact guard, HOB elevated, Used rails     General bed mobility comments: min cues for technique    Transfers Overall transfer level: Needs assistance Equipment used: 1 person hand held assist Transfers: Sit to/from Stand, Bed to chair/wheelchair/BSC Sit to Stand: Contact guard assist     Step pivot transfers: Contact guard assist     General transfer comment: able to ambulate hallway and in room with HHA and min cues      Balance Overall balance assessment: Needs assistance Sitting-balance support: Feet supported Sitting balance-Leahy Scale: Good     Standing balance support: During functional activity, Single extremity supported Standing balance-Leahy Scale: Fair Standing balance comment: mild ataxia and unsteady with turns and reach OBOS                           ADL either performed or assessed with clinical judgement   ADL Overall ADL's : Needs assistance/impaired Eating/Feeding: Set up;Sitting   Grooming: Wash/dry hands;Wash/dry face;Oral care;Sitting;Cueing for sequencing;Contact guard assist   Upper Body Bathing: Contact guard assist;Sitting;Cueing for sequencing   Lower Body Bathing: Minimal assistance;Sitting/lateral leans;Sit to/from stand   Upper Body Dressing : Sitting;Minimal assistance   Lower Body Dressing: Minimal assistance;Sitting/lateral leans;Sit to/from stand;Cueing for sequencing Lower Body Dressing Details (indicate cue type and reason): assist for Foley catheter management Toilet Transfer: Contact guard assist;Grab bars;Cueing for  sequencing Toilet Transfer Details (indicate cue type and reason): hand held assist Toileting- Clothing Manipulation and Hygiene: Minimal assistance   Tub/ Shower Transfer: Minimal assistance   Functional mobility during ADLs: Contact guard assist;Cueing for safety (used intermittent HHA) General ADL Comments: min cues for sequencing, able to figure 4 without issues Bly     Vision Baseline Vision/History: 0 No visual deficits Ability to See in Adequate Light: 0 Adequate Patient Visual Report: No change from baseline Vision Assessment?: No apparent visual deficits            Pertinent Vitals/Pain Pain Assessment Pain Assessment: No/denies pain     Extremity/Trunk Assessment Upper Extremity Assessment Upper Extremity Assessment: Overall WFL for tasks assessed;Right hand dominant   Lower Extremity Assessment Lower Extremity Assessment: Overall WFL for tasks assessed   Cervical / Trunk Assessment Cervical / Trunk Assessment: Normal   Communication Communication Communication: No apparent difficulties   Cognition Arousal: Alert Behavior During Therapy: WFL for tasks assessed/performed Cognition: Cognition impaired   Orientation impairments: Time Awareness: Online awareness impaired Memory impairment (select all impairments): Short-term memory Attention impairment (select first level of impairment): Selective attention Executive functioning impairment (select all impairments): Sequencing, Problem solving OT - Cognition Comments: mild delay in processing this session, higher level organization and sequencing deficits                 Following commands: Impaired Following commands impaired: Follows one step commands with increased time     Cueing  General Comments   Cueing Techniques: Verbal cues;Visual cues  permanent Foley catheter in place, BP remained stable with end reading 105/68, HR 81, SpO2 98% on RA           Home Living Family/patient expects to  be discharged to:: Private residence Living Arrangements: Spouse/significant other Available Help at Discharge: Personal care attendant;Family Type of Home: House Home Access: Stairs to enter [employer] of Steps: 1   Home Layout: Multi-level;Able to live on main level with bedroom/bathroom Alternate Level Stairs-Number of Steps: FF   Bathroom Shower/Tub: Producer, television/film/video: Handicapped height Bathroom Accessibility: Yes How Accessible: Accessible via wheelchair;Accessible via walker Home Equipment: Rolling Walker (2 wheels);Rollator (4 wheels);BSC/3in1;Shower seat;Grab bars - tub/shower;Hand held shower head;Wheelchair - manual   Additional Comments: was amb without AD and attending OPPT      Prior Functioning/Environment Prior Level of Function : Needs assist;Working/employed  Cognitive Assist : Mobility (cognitive);ADLs (cognitive) Mobility (Cognitive): Set up cues ADLs (Cognitive): Set up cues Physical Assist : Mobility (physical);ADLs (physical) Mobility (physical): Stairs;Gait ADLs (physical): IADLs;Bathing Mobility Comments: occasional use of AD when unsteady, remains working some as Teacher, English as a foreign language of [employer] ADLs Comments: some support from PCA and wife for BADL's and transportation, Foley catheter management    OT Problem List: Decreased activity tolerance;Impaired balance (sitting and/or standing);Decreased cognition   OT Treatment/Interventions: Self-care/ADL training;Therapeutic exercise;Neuromuscular education;Energy conservation;DME and/or AE instruction;Therapeutic activities;Cognitive remediation/compensation;Balance training;Patient/family education      OT Goals(Current goals can be found in the care plan section)   Acute Rehab OT Goals Patient Stated Goal: to go home OT Goal Formulation: With patient/family Time For Goal Achievement: 05/02/24 Potential to Achieve Goals: Good ADL Goals Pt Will Perform Grooming: with  supervision;standing Pt Will Perform Lower Body Bathing: with contact guard assist;sit to/from stand Pt Will Perform Lower Body Dressing: with contact guard assist;sit to/from stand Pt Will Transfer to Toilet: with supervision;ambulating;grab bars;regular height toilet Pt/caregiver will Perform Home Exercise Program: Increased strength;Both right and left upper  extremity;With written HEP provided;With Supervision   OT Frequency:  Min 2X/week       AM-PAC OT "6 Clicks" Daily Activity     Outcome Measure Help from another person eating meals?: A Little Help from another person taking care of personal grooming?: A Little Help from another person toileting, which includes using toliet, bedpan, or urinal?: A Little Help from another person bathing (including washing, rinsing, drying)?: A Little Help from another person to put on and taking off regular upper body clothing?: A Little Help from another person to put on and taking off regular lower body clothing?: A Little 6 Click Score: 18   End of Session Equipment Utilized During Treatment: Gait belt Nurse Communication: Mobility status  Activity Tolerance: Patient tolerated treatment well Patient left: in chair;with call bell/phone within reach;with chair alarm set;with nursing/sitter in room  OT Visit Diagnosis: Unsteadiness on feet (R26.81);Ataxia, unspecified (R27.0)                Time: 0830-0900 OT Time Calculation (min): 30 min Charges:  OT General Charges $OT Visit: 1 Visit OT Evaluation $OT Eval Moderate Complexity: 1 Mod OT Treatments $Self Care/Home Management : 8-22 mins Handsome Anglin OT/L Acute Rehabilitation Department  [phone]  04/18/2024, 9:31 AM

## 2024-04-18 NOTE — TOC Transition Note (Signed)
 Transition of Care [facility]) - Discharge Note   Patient Details  Name: [name] MRN: [id number] Date of Birth: 12-Sep-1930  Transition of Care [facility] [address] CM/SW Contact:  Levie Ream, RN Phone Number: 04/18/2024, 10:52 AM   Clinical Narrative:    D/C orders received; no TOC needs.   Final next level of care: Home/Self Care Barriers to Discharge: No Barriers Identified   Patient Goals and CMS Choice Patient states their goals for this hospitalization and ongoing recovery are:: To return home with spouse CMS Medicare.gov Compare Post Acute Care list provided to:: Other (Comment Required) (NA) Choice offered to / list presented to : NA [place] ownership interest in [facility].provided to:: Parent NA    Discharge Placement                       Discharge Plan and Services Additional resources added to the After Visit Summary for   In-house Referral: NA Discharge Planning Services: CM Consult Post Acute Care Choice: NA          DME Arranged: N/A DME Agency: [employer]       HH Arranged: NA          Social Drivers of Health (SDOH) Interventions SDOH Screenings   Food Insecurity: No Food Insecurity (04/15/2024)  Housing: Low Risk  (04/15/2024)  Transportation Needs: No Transportation Needs (04/15/2024)  Utilities: Not At Risk (04/15/2024)  Financial Resource Strain: Low Risk  (03/07/2023)  Social Connections: Socially Integrated (04/15/2024)  Tobacco Use: Low Risk  (04/16/2024)     Readmission Risk Interventions    04/16/2024   10:10 AM  Readmission Risk Prevention Plan  Post Dischage Appt Complete  Medication Screening Complete  Transportation Screening Complete

## 2024-04-18 NOTE — Progress Notes (Signed)
 Wife at bedside. Discharge completed by Aubrey Blas, RN.  Midline removed. Pt stable and in no acute distress. Pt transported off unit via wheelchair.

## 2024-04-24 ENCOUNTER — Encounter (HOSPITAL_COMMUNITY): Payer: Self-pay

## 2024-04-24 ENCOUNTER — Telehealth: Admitting: Adult Health

## 2024-04-24 DIAGNOSIS — R5382 Chronic fatigue, unspecified: Secondary | ICD-10-CM | POA: Diagnosis not present

## 2024-04-24 DIAGNOSIS — M353 Polymyalgia rheumatica: Secondary | ICD-10-CM | POA: Diagnosis not present

## 2024-04-24 DIAGNOSIS — M79605 Pain in left leg: Secondary | ICD-10-CM | POA: Diagnosis not present

## 2024-04-24 DIAGNOSIS — D472 Monoclonal gammopathy: Secondary | ICD-10-CM | POA: Diagnosis not present

## 2024-04-24 DIAGNOSIS — R269 Unspecified abnormalities of gait and mobility: Secondary | ICD-10-CM | POA: Diagnosis not present

## 2024-04-25 ENCOUNTER — Encounter: Payer: Self-pay | Admitting: Physician Assistant

## 2024-04-25 ENCOUNTER — Telehealth: Admitting: Adult Health

## 2024-04-25 DIAGNOSIS — D472 Monoclonal gammopathy: Secondary | ICD-10-CM | POA: Diagnosis not present

## 2024-04-25 DIAGNOSIS — M8580 Other specified disorders of bone density and structure, unspecified site: Secondary | ICD-10-CM | POA: Diagnosis not present

## 2024-04-25 DIAGNOSIS — J9601 Acute respiratory failure with hypoxia: Secondary | ICD-10-CM | POA: Diagnosis not present

## 2024-04-25 DIAGNOSIS — K219 Gastro-esophageal reflux disease without esophagitis: Secondary | ICD-10-CM | POA: Diagnosis not present

## 2024-04-25 DIAGNOSIS — I480 Paroxysmal atrial fibrillation: Secondary | ICD-10-CM | POA: Diagnosis not present

## 2024-04-25 DIAGNOSIS — D638 Anemia in other chronic diseases classified elsewhere: Secondary | ICD-10-CM | POA: Diagnosis not present

## 2024-04-25 DIAGNOSIS — E2740 Unspecified adrenocortical insufficiency: Secondary | ICD-10-CM | POA: Diagnosis not present

## 2024-04-25 DIAGNOSIS — L03314 Cellulitis of groin: Secondary | ICD-10-CM | POA: Diagnosis not present

## 2024-04-25 DIAGNOSIS — F0390 Unspecified dementia without behavioral disturbance: Secondary | ICD-10-CM | POA: Diagnosis not present

## 2024-04-25 DIAGNOSIS — F32A Depression, unspecified: Secondary | ICD-10-CM | POA: Diagnosis not present

## 2024-04-25 DIAGNOSIS — D649 Anemia, unspecified: Secondary | ICD-10-CM | POA: Diagnosis not present

## 2024-04-25 DIAGNOSIS — D72829 Elevated white blood cell count, unspecified: Secondary | ICD-10-CM | POA: Diagnosis not present

## 2024-04-25 DIAGNOSIS — M353 Polymyalgia rheumatica: Secondary | ICD-10-CM | POA: Diagnosis not present

## 2024-04-26 ENCOUNTER — Telehealth: Payer: Self-pay | Admitting: *Deleted

## 2024-04-26 DIAGNOSIS — M75121 Complete rotator cuff tear or rupture of right shoulder, not specified as traumatic: Secondary | ICD-10-CM | POA: Diagnosis not present

## 2024-04-26 DIAGNOSIS — M25511 Pain in right shoulder: Secondary | ICD-10-CM | POA: Diagnosis not present

## 2024-04-26 NOTE — Telephone Encounter (Signed)
 TCT patient's wife regarding recent bone marrow biopsy results. Spoke with her. Per Dr. Rosaline Coma:   "No evidence of blood cancer or fibrosis. Marrow did have a small population of abnormal cells, likely not causing harm. They are producing the abnormal protein we are seeing. No treatment or intervention required, would recommend monitoring "  [name] voiced understanding. She is aware of her husband's appts in July.

## 2024-04-29 DIAGNOSIS — M79605 Pain in left leg: Secondary | ICD-10-CM | POA: Diagnosis not present

## 2024-04-29 DIAGNOSIS — R5382 Chronic fatigue, unspecified: Secondary | ICD-10-CM | POA: Diagnosis not present

## 2024-04-29 DIAGNOSIS — R269 Unspecified abnormalities of gait and mobility: Secondary | ICD-10-CM | POA: Diagnosis not present

## 2024-04-30 ENCOUNTER — Ambulatory Visit: Admitting: Internal Medicine

## 2024-04-30 VITALS — HR 82 | Ht 72.0 in | Wt 172.0 lb

## 2024-04-30 DIAGNOSIS — R0609 Other forms of dyspnea: Secondary | ICD-10-CM | POA: Diagnosis not present

## 2024-04-30 DIAGNOSIS — J849 Interstitial pulmonary disease, unspecified: Secondary | ICD-10-CM | POA: Diagnosis not present

## 2024-04-30 DIAGNOSIS — R5381 Other malaise: Secondary | ICD-10-CM | POA: Diagnosis not present

## 2024-04-30 NOTE — Patient Instructions (Addendum)
 ICD-10-CM   1. Interstitial lung disease (HCC)  J84.9     2. DOE (dyspnea on exertion)  R06.09     3. Physical deconditioning  R53.81       Your interstitial lung disease still persist.  Unclear if drops in oxygen  with walking the same or getting worse.  Antifibrotic therapy carries some amount of risk with side effects but there is a new 1 being approved in the next few months that might have less side effect profile.    Plan  - Now that you are more physically fit lets do a breathing test on you in 6 weeks and consider antifbrotic  - we have held this off due to fatigue as side effect - Continue to oxygen  as needed = try whey  protein 1 scoope daily (optimum nutrition good brand)  - try creatine supplement for muscle building (chec wih  PCP [name], MD and [place])  Follow-up -Return to see Dr. Bertrum Brodie in 6 weeks; 30-minute visit  - Depending on results discussed potential antifibrotic therapy

## 2024-04-30 NOTE — Progress Notes (Signed)
 Admit date:     01/24/2023  Discharge date: 01/25/2023  Principal Problem:   Fever Patient is a 89 year old male with past medical history of polymyalgia rheumatica on 5 mg of prednisone  daily, hyperlipidemia and AAA who while on vacation last June (7 months ago) contracted a viral illness causing some upper respiratory symptoms.  At that time, patient not tested for any viral illnesses including COVID.  Soon after, this viral illness caused his PMR to flareup and patient's prednisone  dosage increased from 5 mg to 25 mg (starting August 2023).  Patient's joint pain resolved sometime soon after.  However, since this viral illness, patient has had significant fatigue impacting his life. 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  Patient was seen by his rheumatologist on 2/21 and in attempt to wean down his prednisone , patient started on weekly methotrexate.  (Prednisone  not yet weaned until methotrexate felt to be efficacious.)   Patient's wife noted that he has been having fevers that had started Saturday, 2/24.  Patient started first dose of weekly methotrexate Sunday, 2/25.  (Wife and patient are very clear as to when fevers are started and when methotrexate dose was first given, the day after fever started.)  Patient saw his PCP on 2/27 and given new fevers, there were concerns that patient in his steroid-induced immunosuppressed state may have endocarditis and was referred to the emergency room for further evaluation.  In the emergency room, patient noted to have normal lactic acid and a white blood cell count of 14.5 as well as a sed rate of 78.  Brought in for further evaluation.  Fever During patient's entire hospitalization, was not febrile.  Unclear etiology if he has any infection.  Chest x-ray and urinalysis unremarkable.  White blood cell count elevated, but mildly and in the  context of patient being on high-dose of steroids continuously.  Blood cultures drawn and patient was given 1 dose of cefepime  and vancomycin .  Patient otherwise asymptomatic (other than complaints of fatigue).  Do not think patient has endocarditis.  He has no clinical signs.  Exam is unremarkable.   Additional possibility could be rheumatologic in nature?  Patient with elevated sed rate despite being on chronic high-dose steroids.  A new autoimmune inflammatory condition may cause a fever, although patient with no other symptoms.   No reason for additional antibiotics.  Will follow blood cultures and if positive will check echocardiogram.  Will plan to discharge.   Addendum: Blood cultures final result negative   Fatigue Had extensive discussion with patient and his wife about his fatigue.  I have a strong suspicion that he may have contracted COVID last summer and since then, has been suffering the effects of long COVID which have caused his fatigue.  (If this is indeed long COVID, discussed with patient and wife about new trials looking at treating long COVID successfully with SSRIs.  This may be something to consider as per patient's PCP.)   See below in regards to prednisone  and methotrexate.   For completeness sake, ordered CMV, Epstein-Barr, [place] spotted fever and Lyme disease titers to look for other causes of fatigue.  (Addendum: Following discharge, these titers all came back negative.)   Polymyalgia rheumatica (HCC) Patient has been on 5 mg of prednisone  for his polymyalgia rheumatica for some time.  The viral illness that he contracted last summer triggered  a flareup causing joint pain.  This was treated with increasing prednisone  dose to 25 mg daily.   There appears to be some confusion in the efficacy of this treatment.  Patient's joint pain did indeed respond to elevated prednisone  dose, however attempts to reduce prednisone  dose led to worsening of fatigue.  Had extensive  discussion with patient and wife that the fatigue and joint pain are likely unrelated.  Patient probably felt worse with prednisone  decreased as prednisone  can often cause a feeling of increased energy and wakefulness.   There may have been confusion between patient and providers that patient unable to wean off prednisone  due to failure of therapy.  Therefore, discontinuing methotrexate.  Weaning down prednisone  very slowly over the next month to get patient back to 5 mg p.o. daily.  Fatigue appears to be independent and as above recommended treatment.  INPATIENT PULM COJSULT 04/06/23   Acute Hypoxic Respiratory Failure in setting possibly of multifocal pna vs eosinophilic pna versus organizing pneumonia Acute PE  PMR on chronic prednisone , immunocompromised  Patient remains on high flow nasal cannula oxygen , unable to titrated down currently Continue IV antibiotics, currently on vancomycin  and cefepime , infectious disease following Started on high-dose steroid with Solu-Medrol  125 mg every 6 hours with suspected inflammatory lung disease Follow-up autoimmune panel Will try to get sputum culture and sputum pneumocystis PCR Ideally patient needs bronchoscopy with BAL and transbronchial biopsy but due to high oxygen  requirement, will currently hold off Continue subcu Lovenox  therapeutic dose Monitor intake and output  Xxxx Renal visit [facility] 02/07/23  Pt reports he was diagnosed with PMR 30 years ago with large proximal aches. He was started on prednisone  and felt immediately better. For the next 30 years he took 5 mg a day of prednisone  and did very well. 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 In June of last year he developed a virus which consisted of profound fatigue and malaise. This caused his PMR to "explode". He had loss of energy and his sed rate went up. He no longer wanted to exercise. His ESR went up to 101 and his prednisone  was increased to 30 mg daily. He felt somewhat better on  this higher dose but not back to normal.   Somewhere in this interim he developed a femoral nerve neuropathy that resolved with aggressive PT.   In August he developed covid with a 102 fever. He did not require hospitalization.   On 2/21 he followed up with rheumatology who recommended tapering the prednisone . He was cut down to 25 mg. He was started on weekly methotrexate but only took one dose.   He was admitted to [facility] at the end of February due to low BP (100 systolic; normal for him is mildly hypertensive), chills, and temp to 101-102. . In the emergency room, patient noted to have normal lactic acid and a white blood cell count of 14.5 as well as a sed rate of 78. During patient's entire hospitalization, was not febrile. Unclear etiology if he has any infection. Chest x-ray and urinalysis unremarkable. White blood cell count elevated, but mildly and in the context of patient being on high-dose of steroids continuously. Blood cultures drawn and patient was given 1 dose of cefepime  and vancomycin . Patient otherwise asymptomatic (other than complaints of fatigue). They stopped the methotrexate at the ER.   Since the hospitalization he's been even more fatigued. Denies red eyes, ear pain, severe sinus symptoms, hemoptysis, SOB, chest pain, significant GI symptoms, difficulty urinating,  hematuria, leg swelling, skin rashes, fevers.  Eating/drinking fine. No typical PMR muscle pains, no joint pains. He does endorse night sweats requiring him to change his pajamas.   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 He remains very active with [employer]. He has been a lifelong athlete  Xxxxxxxxx[id number]  [facility] RHEUM VISIT [phone]  Jamieon Lannen is a 89 y.o. male with a history of PMR who presents for evaluation of fatigue, fevers, and hematuria noted to have a dilated thoracic aorta and abdominal aortic aneurysm, treated in past with endovascular repair. DDx for his presentation includes  possible large vessel vasculitis, infection of the aorta. We talked about the possibility of GCA today, but he does not have any cranial symptoms. GCA would also not explain the red cell casts in his urine today. He may have ANCA vasculitis, which would potentially tie together his lung nodules and urine findings. He is seeing Dr. Vallery Gavel in ID today to rule out infectious causes given his recent fevers so out visit was abbreviated.  - check ANCA panel today - consider PET scan - taper prednisone  - ID appointment today - Discussed with Dr. Rogelio Clas potential admission to hospital to expedite workup   xxxxxxxxxxxxxxxxxxxxxxxxxxxxxxxxxxxxxxxx[id number] Admit date: 03/05/2023  -Discharge date: 03/23/2023  OSMIN WELZ is a 89 y.o. male with medical history significant for polymyalgia rheumatica on chronic prednisone  therapy, AAA status post endovascular stent graft in September 2018, chronic diastolic heart failure, recent diagnosis of aortitis, chronic leukocytosis, who is admitted to [facility] on 03/05/2023 with acute pulmonary embolism after presenting from home to [facility] ED complaining of generalized weakness.    [facility] for generalized weakness.  Subsequently, he was admitted to [facility] with similar initial complaint, before being discharged home on 02/13/2023.  During hospitalization at [facility], there was reportedly concern for potential aortitis, for which the patient was started on long-term current spectrum of antibiotics via PICC line, including daptomycin , Rocephin , and Bactrim .  He notes good interval compliance with these antibiotics.   However, over the last 1 to 2 days, he is noted progressive generalized weakness in the absence of any acute focal weakness.  He qualifies his generalized weakness is being significant enough to prevent him from getting out of bed over the last day, which is quite different  than his baseline functionality and activity level.  Denies any associated acute focal weakness.    He has a history of polymyalgia rheumatica and has been on chronic steroid therapy for multiple decades.  Recently, there have been outpatient attempts at reducing his dose of chronic prednisone , and he notes that the most recent dose adjustment down to 15 mg of daily prednisone  occurred a few days prior to the onset of his presenting generalized weakness.   He denies any recent subjective fever, chills, rigors, or generalized myalgias.  Denies any new headache, neck stiffness, rash, cough, hemoptysis, shortness of breath, abdominal pain, diarrhea, dysuria or gross hematuria.  He also denies any recent chest pain, palpitations, diaphoresis, nausea, vomiting, dizziness, presyncope, or syncope.  No recent worsening of peripheral edema, or any recent/new lower extremity erythema or calf tenderness.  Denies any known history of prior pulmonary embolism.   Aside from the 2 hospitalizations over the last 6 weeks, denies any additional periods of prolonged diminished ambulatory activity as of recent.  No recent trauma.  No known history of underlying malignancy.  Not on any  blood thinners as an outpatient, including no aspirin .   Per chart review, it appears that he also has chronic leukocytosis with chart review revealing most recent prior white blood cell count of 18.8 on 02/28/2023, with other blood cell data points notable for 25.4 on 02/07/2023.   Denies any known baseline supplemental oxygen  requirements.   ESR 105 compared to most recent prior value of 69 on 01/25/2023, CRP 12.8 compared to 75 on 02/28/2023. CBC notable for the following: Open cell count 20,000 with 89% neutroph     CTA chest, relative to CT chest from 12/02/2022, per formal radiology read, shows acute pulmonary embolism at the distal right main pulmonary artery extending into the right middle and lower pulmonary arteries, with CT evidence of  right heart strain.  Additionally, CTA chest shows pulmonary parenchymal findings suggestive of multifocal pneumonia superimposed on a background of interstitial lung disease as well as small bilateral pleural effusions.  CTA chest also shows unchanged aneurysmal dilation of the ascending, transverse, and descending thoracic aorta, without any corresponding evidence of dissection.   EDP discussed the patient's case with the on-call infectious disease physician, Dr. Carloyn Chi dam, he felt that acute pneumonia was slightly less likely given the patient's broad-spectrum IV antibiotics over the last several weeks, including daptomycin , Rocephin , and Bactrim .  He did not recommend changing daptomycin  to IV vancomycin , but rather recommended continuation of daptomycin .  He did however recommend consideration for escalation of Rocephin  to cefepime , with no additional changes recommended per infectious disease at this tim   Latest Reference Range & Units 03/06/23 21:40  ANA Ab, IFA  Negative  Anti JO-1 0.0 - 0.9 AI <0.2  CCP Antibodies IgG/IgA 0 - 19 units 2  RA Latex Turbid. <14.0 IU/mL 26.0 (H)  Cytoplasmic (C-ANCA) Neg:<1:20 titer <1:20  P-ANCA Neg:<1:20 titer <1:20  Atypical P-ANCA titer Neg:<1:20 titer <1:20  Anti-MPO Antibodies 0.0 - 0.9 units <0.2  Anti-PR3 Antibodies 0.0 - 0.9 units <0.2  C3 Complement 82 - 167 mg/dL 161  Complement C4, Body Fluid 12 - 38 mg/dL 28  SSA (Ro) (ENA) Antibody, IgG 0.0 - 0.9 AI <0.2  SSB (La) (ENA) Antibody, IgG 0.0 - 0.9 AI <0.2  Scleroderma (Scl-70) (ENA) Antibody, IgG 0.0 - 0.9 AI 0.4  (H): Data is abnormally high  Hospital Course:  #1 acute hypoxic respiratory failure in the setting of multifocal pneumonia versus eosinophilic pneumonia from daptomycin  versus interstitial lung disease/autoimmune process/acute PE with right lower extremity DVT in the setting of PMR on chronic steroids/immunocompromise state -Patient was seen in consultation by ID, daptomycin   discontinued on 03/05/2023 due to concerns for possible eosinophilic pneumonia. -Patient with improvement currently on 1 L nasal cannula with sats of 97% at rest. -Patient noted to have been placed on high dose steroids which are being tapered down patient currently on prednisone  40 mg daily recommended to continue for the next 3 to 4 weeks at this dose until seen by PCCM in the outpatient setting. -ID recommended continuation of IV Rocephin , oral doxycycline , Bactrim  for PJP prophylaxis. -Patient completed course of antibiotics 03/23/2023. -ID recommending continuation of Bactrim  for pneumocystis prophylaxis while on high-dose steroids. -Will need outpatient follow-up with ID at [facility] postdischarge.   2.  Paroxysmal atrial fibrillation/atrial flutter -Seen in consultation by cardiology and felt likely secondary to acute pulmonary issues. -Noted to have converted on IV amiodarone  which was continued for 24 hours and subsequently discontinued. -Patient subsequently maintained on low-dose Lopressor  for rate control, Eliquis  for anticoagulation.  3.  PE/right lower extremity DVT -Noted on CT angiogram chest. -Patient noted to have elevated troponins were likely secondary to demand ischemia. -Lower extremity Dopplers done positive for right lower extremity DVT. -2D echo done with abnormal septal motion, EF 55 to 60%,NWMA, no evidence of cor pulmonale in the setting of recent PE. -Patient initially placed on IV heparin  and subsequently transition to Eliquis .   -Patient improved clinically.   -Patient will be discharged on Eliquis .   -Outpatient follow-up with pulmonary and PCP.    4.  AAA status post graft and possible aortitis -Patient noted to have been on daptomycin  per ID at [facility] which was discontinued during this hospitalization due to concerns for eosinophilic pneumonia. -Patient received a full course of IV Rocephin  and IV doxycycline  which was completed during the hospitalization on day of  discharge.  -Patient also maintained on Bactrim  for PJP prophylaxis per ID recommendations while on steroids.  Patient will be discharged on Bactrim  for PJP prophylaxis. -Outpatient follow-up with ID at [facility].   5.  Acute delirium/anxiety -Possibly secondary to hospital delirium versus from hypoxia versus steroid-induced. -Improved clinically.      6.  PMR -Patient noted to chronically be on 5 mg of prednisone  at home was on high-dose steroids and tapered down to 40 mg of prednisone  daily secondary to problem #1 by day of discharge.  Patient be discharged on prednisone  40 mg daily until follow-up with pulmonary.     8.  Anemia of chronic disease -H&H stable. -No overt bleeding.   9. chronic leukocytosis -Patient noted to have a chronic leukocytosis could be secondary to steroid induced. -Labs with some improvement with leukocytosis by day of discharge.   -Patient completed course of antibiotics during the hospitalization, remained afebrile.   -Outpatient follow up with hematology for follow-up on leukocytosis.   -Ambulatory referral placed. -Patient will be discharged to a skilled nursing facility.   10.  GERD -Patient maintained on PPI, Pepcid , Zofran .         13.  Pressure injury, not present on admission Pressure Injury 03/12/23 Sacrum Mid Stage 1 -  Intact skin with non-blanchable redness of a localized area usually over a bony prominence. Pink non blanching on sacrum (Active)    Previous [facility] pulmonary encounter:  05/01/2023- Dr. Diania Fortes     Chief Complaint  Patient presents with   Hospitalization Follow-up      HFU. Still using 4L of O2.     Webber Michiels is a 89 year old male with history of polymyalgia rheumatica on chronic prednisone  and recent hospitalization at [facility] where he was diagnosed with aortitis and started on daptomycin  and rocephin  via PICC line on 3/14. He presented to [facility] on 4/7 with generalized weakness and shortness of breath. CTA Chest on  admission showed pulmonary embolism and bilateral patchy ground-glass and consolidative opacities with interlobular septal thickening superimposed on back ground subpleural reticulation and bronchiolectasis. Echocardiogram did not show RV strain. He was started on heparin  drip. He required transfer to the ICU for acute hypoxemic respiratory failure with heated high flow oxygen  requirements. He was started on pulse dose steroids and broad spectrum antibiotics. Bronchoscopy was not performed due to his significant oxygen  requirements. He was transferred out of the ICU on 4/18 with decreasing O2 requirements. He was discharged on 40mg  of prednisone  daily and bactrim  prophylaxis. He was started on nystatinc for thrush. He was discharged on 1-2L of oxygen  and went to rehab.    He started  having bowel trouble at [facility] for about a month with constipation. He has been started on aggressive bowel regimen and has required manual disimpaction per his wife. He had large explosive bowel movement in clinic today.    His prednisone  was reduced to 20mg  daily at rehab due to concerns of intolerance of high dose therapy. He continues bactrim  DS 1 tab 3 days per week.    He is working on walking with a walker, he walked 32 steps yesterday without assistance.    He has reduced appetite and is trying to drink protein supplements daily.     06/14/2023 - Interim hx Patient presents today for 1 month follow-up/acute office visit.  He was seen by Dr. Diania Fortes on 05/01/2023 hospital follow-up due to respiratory failure in the setting of interstitial lung disease.  Patient has a history of Polymyalgia rheumatica on chronic prednisone .  Differential for respiratory failure includes eosinophilic pneumonia in the setting of daptomycin  use versus progressive ILD process.  Patient has mild basilar reticulation on CT chest from March 2022.  Inflammatory workup has been negative.  Follow-up chest x-ray on 05/01/2023 showed patchy  bilateral interstitial and alveolar opacity bilaterally not significantly changed compared to prior chest x-ray from April 2024.    Accompanied by his wife and nursing aid today Breathing is about the same  He is on supplemental oxygen , for the most part he is using 1L oxygen  at home  He is on 20mg  prednisone  daily with Bactrim  double strength 1 tablet 3 days/week for pneumocystitis prophylaxis.  Need to discuss prednisone  taper if able    Patient had UTI symptoms (frequency/urgency, fever and confusion) on July 6. He had some air hunger at this time. No associated URI symptoms or cough. Prescribed abx Augmentin x 10 days and doxycycline  x10 days. CXR done with Dr. Genelle Kennedy, results are not accessible in Epic. He is doing better. He is more tired last several days. He is having some stomach/abdomen discomfort. He does struggle with constipation.  He had a small bowel movement today.  He has been compliant with stool softener.  Plan  - reduce prednisone  to 15mg  per day  OV 07/04/2023 -transfer of care to Dr. Bertrum Brodie in the ILD center.  Subjective:  Patient ID: Braulio Calender, male , DOB: 07-12-1930 , age 82 y.o. , MRN: [id number] , ADDRESS: [address] [place] [address] PCP [name], MD Patient Care Team: [name], MD as PCP - General (Internal Medicine) Wendie Hamburg, MD as PCP - Cardiology (Cardiology) Ladon Pickler, MD as Referring Physician (Ophthalmology) Palma Bob, MD (Inactive) as Consulting Physician (Vascular Surgery) Tobin Forts, MD as Consulting Physician (Gastroenterology)  This Provider for this visit: Treatment Team:  Attending Provider: Maire Scot, MD    07/04/2023 -   Chief Complaint  Patient presents with   Hospitalization Follow-up    New pt. And advise on health     HPI SHONDELL POULSON 89 y.o. 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  Here with his wife Amalia Badder and also caretaker Stana Ear.  History is provided by the  wife, caretaker, a little bit also from the patient also review of the external medical record.  All this was done on 07/03/2023.  Subsequent history also taken on 07/04/2023 the following day from [facility].  Neither primary care physician.  The very complex story.  It appears that even at baseline he has had some mild atelectasis changes in his lower lung fields but this was essentially asymptomatic  and he was caring about his activities of daily living.   Has chronic history of polymyalgia rheumatica and has been on low-dose steroids for a few decades.  He has had persistently elevated ESR at least between 70-100 for the last 2 years according to PCP.  At some point because of possible PMR flare he was subjected increased prednisone  dose but he always had this fatigue.   Review of the records indicate that starting February 2024 he started having leukocytosis and new onset anemia.  He did have an admission in the hospital at the time for fever.  Subsequently had extensive workup in the spring 2024 at [facility].  He had an admission for this.  They have an considered whether he might have aortitis.  This was in mid March 2024.  He was then treated with daptomycin  in the hospital.  He was discharged in early April 2024 with a PICC line but shortly after that at home he collapsed.  Was diagnosed with submassive PE and interstitial pneumonitis.  He had no evidence of RV strain on the echo but his RV-LV ratio was 1.14.  Is on high flow oxygen .  Was given high-dose steroids.  During this time had ICU delirium including according to both primary care physician and D Vandam the infectious disease physician that I spoke to neuro suicidal ideations.  He was then discharged to [facility] where he spent 9 weeks and finally got home on 05/31/2023.  He is currently undergoing intense physical therapy at home.  During all this his high flow oxygen  needs have improved he is such that he is only on 1-2 L nasal  cannula at rest.  The caretaker states that sometimes even at rest he is got normal pulse ox and he can go several hours without using oxygen .  He is able to get around with a walker but when he walks a certain distance such as 30 to 40 feet he will get extremely tired.   Overall the wife initially said that he was not any better but they did admit that he is now more conditioned.  He is also having reduced oxygen  needs but the fatigue is the main component.  In talking to primary care physician as well fatigue seems to be the biggest component.  In fact a few days ago primary care physician called him in and had his hemoglobin checked it was 7 g%.  He just finished 2 units of transfusion.  He is somewhat better but still feeling extremely fatigued.  His leukocytosis continues and wife is frustrated about this and feels this might be associated with the fatigue.  In terms of his PMR and ILD his current prednisone  treatment is at 15 mg/day.  This was slowly tapered since his hospitalization in April 2024.  Dr. Devault had instructed him to take 15 mg/day.  This was a clinical taper.  Despite different doses of prednisone  his ESR is always continue to be high and the reason for this is not known.  In terms of pulmonary embolism he continues his Eliquis  low dose His last echocardiogram was in April 2024.  His EF is 55 to 60% and had no evidence of RV strain.   OV 07/10/2023  Subjective:  Patient ID: Braulio Calender, male , DOB: [birth year] , age 89 y.o. , MRN: [id number] , ADDRESS: [address] [place] [address] PCP [name], MD Patient Care Team: [name], MD as PCP - General (Internal Medicine)  Wendie Hamburg, MD as PCP - Cardiology (Cardiology) Ladon Pickler, MD as Referring Physician (Ophthalmology) Palma Bob, MD (Inactive) as Consulting Physician (Vascular Surgery) Tobin Forts, MD as Consulting Physician (Gastroenterology)  This Provider for this  visit: Treatment Team:  Attending Provider: Maire Scot, MD  Type of visit: Video Virtual Visit Identification of patient JOSHAWA DUBIN with 08-Aug-1930 and MRN [id number] - 2 person identifier Risks: Risks, benefits, limitations of telephone visit explained. Patient understood and verbalized agreement to proceed Anyone else on call: His wife.  Also the caretaker.  He himself could not join Patient location: His home This provider location: [address], [address]; [place]; [place] [address]. [facility]. [phone]    07/10/2023 -follow-up respiratory failure.   HPI JAWAD WIACEK 89 y.o. - In this video visit the purpose is to discuss CT scan results and then to catch up.  I personally visualized the CT scan over time and also showed it to the wife and the caretaker.  He has had some basal chronic ILD changes even 2022 in 2021.  It was extremely mild and could have been passed off as atelectasis.  He also had this in early 2024.  However clearly in April 2024 he had significant groundglass opacities and alveolar airspace filling.  Dense consolidation.  This could easily have been Boop with a high ESR but then his ESR is been chronically elevated for over 2 years according to PCP.  He has responded to steroids.  His lungs have improved and left with residual scar although the level of residual scar seems worse than his baseline.  There is currently very little any groundglass opacities currently on 15 mg prednisone  since mid July 2024.  He is stable on 1 L nasal cannula occasionally 2 L according to caretaker.  We took a shared decision making to reduce to 10 mg/day [his baseline was 5 mg to-10 mg/day for his PMR].  Did indicate to the wife there is a small chance that things could flareup but overall this chronic toxicity to deal with with prednisone  and therefore we took a shared decision making to reduce.  Regarding his other issues such as fatigue, anemia, other  reasons for elevated ESR he is seeing hematology.  I have also spoken infectious disease doctor.  If he has a bone marrow biopsy they will do cultures.  His QuantiFERON gold was indeterminate.  I spoke to Dr. Fontaine Ice he does not think patient has TB.  I agree with that.  His anemia also appears to have improved.    OV 08/08/2023  Subjective:  Patient ID: Braulio Calender, male , DOB: 11-25-1930 , age 70 y.o. , MRN: [id number] , ADDRESS: [address] [place] [address] PCP [name], MD Patient Care Team: [name], MD as PCP - General (Internal Medicine) Wendie Hamburg, MD as PCP - Cardiology (Cardiology) Ladon Pickler, MD as Referring Physician (Ophthalmology) Palma Bob, MD (Inactive) as Consulting Physician (Vascular Surgery) Tobin Forts, MD as Consulting Physician (Gastroenterology)  This Provider for this visit: Treatment Team:  Attending Provider: Maire Scot, MD    08/08/2023 -   Chief Complaint  Patient presents with   Follow-up    F/up on ILD     HPI BELMONT VALLI 89 y.o. -last visit was in mid August 2024.  In the interim I did run into his son and wife outside the hospital at a social event.  They reported that  fatigue continues although he is trying to ambulate and continue with physical therapy.  They have held off on the bone marrow biopsy because of concerns of pain.  The current focus is sto see progress iron infusions.  His most recent labs in our system is from 07/05/2023.  He continues to have normal renal function but he continues to be anemic with a hemoglobin 9.4 g% and is iron deficient.  His thrombocytosis continues.  And his ESR is elevated greater than 100.  He did visit Dr. Arlis Lakes in cardiology on 07/19/2023 for his SVT was noticed to be doing stable on Toprol .  He was continuing his LOW DOSE Eliquis .  Given his anemia Dr. Legrand Puma gastroenterologist on 07/24/2023 felt colonoscopy was putting him at high  risk.  Then on 08/03/2023 with complaints of abdominal pain.  Has had CT scan showed significant stool and consistent with constipation.  Today he presents with his caretaker Stana Ear, wife and his son.  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  -Most recent visit 07/06/2023 with Dr. Fontaine Ice infectious diseases.  Infection considered low possibility but if bone marrow is ever done for his anemia recommended AFB cultures.  ll#Interstitial lung disease/dyspnea on exertion  -Had flareup in April 2022 associate with PE and also daptomycin .  [Daptomycin  given for suspected aortitis at [facility]]  -He remains on oxygen  2 L nasal cannula.  But many times he has been observed to be saturating well when he is off oxygen .  The caretaker states that when she sometimes gives him a shower and helps him with that he is not desaturating when he is not on oxygen ..  In fact today after we turned his oxygen  off he was able to do a sit/stand test without any desaturations.  He is using oxygen  at night but this has not helped his fatigue.  On his CT abdomen lung image August 03, 2023 the ILD is mild.  He is currently on 10 mg prednisone  which is a lower dose compared to his last visit.  They are willing to get a taper down to 5 mg per my advice.  #History of pulm embolism April 2024 for submassive PE -     -He is on low-dose Eliquis  noW.  I ordered an echo last time but the CMA indicated that this order was not placed.  Therefore have reordered this.  Will also check a D-dimer.  If there is no evidence of pulmonary hypertension this reduces risk for any anesthetic complications from any potential procedures.  #Iron deficiency anemia with heme positive stools.  Status post PRBC on 06/27/2023-Associated thrombocytosis and leukocytosis.    - Initial heme-onc consult 07/05/2023   There is shared decision making taken to continue with iron but through  a infusion form and see if he improves.  Last hemoglobin check was on that day 9.4 g%.  According to the family the iron infusions have not helped.  They deferred a bone marrow biopsy because of concerns of pain.  Although today they indicated that patient's 21 year old brother in [place] has had bone marrow biopsy recently and was able to tolerate it well.  -Most recent infusion was on 07/21/2023.   -CT abdomen September 2024 just showed a lot of stool and constipation   -Dr. Elvin Hammer has indicated colonoscopy might be of significant risk although today has been normoxic on room air and was able to do sit and stand  test.-> discussed with him 08/08/2023 - feels supportive care best. He has been in touch with them  #Elevated ESR greater since February 2024 and [facility] health medical records (according to primary care physician present for couple of years]   - No clear etiology known.  He in the past he has not responded to increase prednisone  for polymyalgia rheumatica.  We will recheck this again today.  #History of SVT approximately atrial fibrillation April 2024 follows with Dr. Truddie Furrow  -Most recent visit was on 07/19/2023.  Noted to be on Toprol   #Elevated PSA 07/03/2023  - The [facility].  For the last 2 months he has now had a Foley catheter because of enlarged prostate they are not aware of a prostate cancer diagnosis.  I did reach out to Dr. Parke Boll and have asked him to call me back but he has a chronic Foley for the last 2 months now.  No issues currently on this.   -Did discuss with Dr. Parke Boll.  He does not think prostate cancer is a possibility.  Moreover with the age and no evidence of metastasis it is a nonissue.  He plans for IR embolization of the prostate to shrink it and have the Foley catheter come out.  #Physical deconditioning and fatigue   -His functional strength is improved he is able to transfer better.  He is able to sit stand.  He is  able to walk in the hallway or in the driveway with his walker.  However fatigue is significant.  The son took him to [employer] course and had him sit in the golf cart.  They watched for golf teams come by and after the patient got exhausted and had to go home despite eating a hot dog.   #g symptoms burden  -All significant and see below.  -I do not think the ILD is contributing to the symptom on the other hand is multifactorial and from anemia and high ESR and frailty and cachexia.  -His appetite is diminished but he is able to eat and not gaining weight though.  #Decub ulcer  -Being followed by wound care   #Goals of care  -Remains full code.  They have hesitated on undergoing procedures because of risk and bone marrow biopsy because of pain.   I did indicate to them that sometimes risk might have to be taken as long it is not undue to undergo certain procedures in order to establish clarity in the prognosis and diagnosis.  If reversible etiologies are found then he can improve but if your reversible etiologies are found then at least we have clarity.  They are processing this information. Ultimtely risk v beneift to be dcided by them/patoient an dproceduralists in shared decisin maknig         OV 08/29/2023  Subjective:  Patient ID: Braulio Calender, male , DOB: 1930/10/12 , age 83 y.o. , MRN: [id number] , ADDRESS: [address] [place] [address] PCP [name], MD Patient Care Team: [name], MD as PCP - General (Internal Medicine) Wendie Hamburg, MD as PCP - Cardiology (Cardiology) Ladon Pickler, MD as Referring Physician (Ophthalmology) Palma Bob, MD (Inactive) as Consulting Physician (Vascular Surgery) Tobin Forts, MD as Consulting Physician (Gastroenterology)  This Provider for this visit: Treatment Team:  Attending Provider: Maire Scot, MD    08/29/2023 -   Chief Complaint  Patient presents with   Follow-up  Echo f/u and labs      HPI KEVAUGHN EWING 89 y.o. -Prsents with caretaker Stana Ear and wife Amalia Badder. No hospitalizations since last visit. 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  [Daptomycin  given for suspected aortitis at [facility]]. On 08/08/23: wsa able to sit/stand x5 on room air withou desaturations. So, we reduced his prednisone  to 5mg  per day but he got too fatigued. Per wife and Stana Ear even at [employer]  per day he ws fatigued. So he  is on 15mg  per day and they prefer to stay there. He is wearing 1L [place] now but RA pulse oxx 97%   #History of pulm embolism April 2024 for submassive PE -     -He is on low-dose Eliquis  noW. D-dimer in July 2024: is 1.6 and Sept 2024 was 1.85. ECHO 08/28/23 shows Normal RV And is very reasuring. EF 50-55%. Aortic valve calcification +. Mild - mod stenosis . OPVerall reassuring  #Iron deficiency anemia with heme positive stools.  Status post PRBC on 06/27/2023 for a hemoglobin of 8.1 g%- #Associated thrombocytosis and leukocytosis.  Initial heme-onc consult 07/05/2023   -Did see Dr. Rosaline Coma and his physician assistant Delores Fester on 07/05/2023.  Instead of bone marrow biopsy decided to pursue iron infusions.  Last hemoglobin check showed improvement in hemoglobin to 10.4 g% on 08/08/2023 and is significantly improved.   -Most recent infusion was on 07/21/2023.   -CT abdomen August 03, 2023 just showed a lot of stool and constipation   -Dr. Elvin Hammer has indicated colonoscopy might be of significant risk .  Most recent follow-up with Dr. Elvin Hammer was 08/11/2023.  Continued conservative management has been recommended.  He  was reassured by the CT scan on August 03, 2023.  #Elevated ESR greater since February 2024 and [place] medical records (according to primary care physician present for couple of years]    - No clear etiology known.  He in the past he has not responded to increase prednisone  for polymyalgia rheumatica.  I recheck this again on 08/08/2023 and is again greater than 110.  Dr. Fontaine Ice does not think this from tuberculosis.  Discussed this with the patient and the family.  I will make a referral to [facility] rheumatology.  I also discussed Dr. Fontaine Ice who supports patient being seen by rheumatologist for this.  #History of SVT approximately atrial fibrillation April 2024 follows with Dr. Truddie Furrow   -Most recent visit was on 07/19/2023.  Noted to be on Toprol   #Elevated PSA 07/03/2023 with BPH   - The been following with [facility].  Since approximately June 2024 h per Dr. Parke Boll in September 2024 prostate cancer is considered unlikely.  His plan was for interventional radiology embolization of the prostate to make it shrink it and then remove the Foley.  According to the family on 08/29/2019 for the not sure what the status of this is.  With BPH   #Physical deconditioning and fatigue   -His functional strength is improved he is able to transfer better.  He is able to sit stand.  He is  able to walk in the hallway or in the driveway with his walker.  However fatigue is significant.  The family did concede this is somewhat better.  [place] symptom assessment score is improved [see below].  #GI symptoms -Low appetite continues but is somewhat better - Nausea this continues to be a problem.  There is some better perhaps.  The amount of Zofran  the using is less.  But is still a significant problem.  Wife thinks is associated with acid reflux.   #Decub ulcer  -Being followed by wound care   #Goals of care  -Remains full code.    OV 10/17/2023  Subjective:  Patient ID:  Braulio Calender, male , DOB: 1930-04-08 , age 47 y.o. , MRN: [id number] , ADDRESS: [address] [place] [address] PCP [name], MD Patient Care Team: [name], MD as PCP - General (Internal Medicine) Wendie Hamburg, MD as PCP - Cardiology (Cardiology) Ladon Pickler, MD as Referring Physician (Ophthalmology) Palma Bob, MD (Inactive) as Consulting Physician (Vascular Surgery) Tobin Forts, MD as Consulting Physician (Gastroenterology)  This Provider for this visit: Treatment Team:  Attending Provider: Maire Scot, MD    10/17/2023 -   Chief Complaint  Patient presents with   Follow-up    F/u after [facility] visit.   Mae Eber presents for follow-up with his wife Amalia Badder and caretaker.  No hospitalizations.  They have completed the [facility] visit for his high ESR.  He saw rheumatology.  He is now on methotrexate and is taken 1 weekly dose.  Details otherwise below.  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  [Daptomycin  given for suspected aortitis at [facility]]. On 08/08/23: wsa able to sit/stand x5 on room air withou desaturations. So, we reduced his prednisone  to 5mg  per day but he got too fatigued. Per wife and Stana Ear even at 10mg  per day he ws fatigued.  Currently they went to the restaurant and when his oxygen  came off used a little bit labored.  Today we turned the oxygen  off and the finger pulse ox did not pick up oxygen  levels but with this for probe he was 100% on room air at rest.    #History of pulm embolism April 2024 for submassive PE -     -He is on low-dose Eliquis  noW. D-dimer in July 2024: is  1.6 and Sept 2024 was 1.85. ECHO 08/28/23 shows Normal RV And is very reasuring. EF 50-55%. Aortic valve calcification +. Mild - mod stenosis . OPVerall reassuring.  No bleeding issues.  #Iron deficiency anemia with heme positive stools.  Status post PRBC on 06/27/2023 for a hemoglobin of 8.1 g%- #Associated thrombocytosis and leukocytosis.  Initial heme-onc consult 07/05/2023   -Did see Dr. Rosaline Coma and his physician assistant Delores Fester on 07/05/2023.  Instead of bone marrow biopsy decided to pursue iron infusions.  Last hemoglobin check showed improvement in hemoglobin to 10.4 g% on 08/08/2023 and is significantly improved.   -Most recent infusion was on 07/21/2023.   -CT abdomen August 03, 2023 just showed a lot of stool and constipation   -Dr. Elvin Hammer has indicated colonoscopy might  be of significant risk .  Most recent follow-up with Dr. Elvin Hammer was 08/11/2023.  Continued conservative management has been recommended.  He was reassured by the CT scan on August 03, 2023.   -As of 10/18/2023 not getting blood transfusion iron infusions.  Hemoglobin 10/11/2019 was improved to 12.6 g%.  His color is better.   #Elevated ESR greater since February 2024 and [facility] medical records (according to primary care physician present for couple of years]    - No clear etiology known.  He in the past he has not responded to increase prednisone  for polymyalgia rheumatica.  I recheck this again on 08/08/2023 and is again greater than 110.  Dr. Fontaine Ice does not think this from tuberculosis.  At last visit referred to rheumatology.  He is did see Dr. France Ina at [facility] on 09/20/2023.  External records reviewed.  Diagnosis of PMR is on the chart but vasculitis/rheumatoid arthritis is under the differential.  ESR was not checked at [facility] but CRP was elevated.  Rheumatoid factor was trace positive.  He has been started on methotrexate.  I did indicate to them that methotrexate onset of action could be anywhere from 4-12  weeks.  The ultimate goal is to slowly taper the prednisone .  [facility]ology is handling this.   #History of SVT approximately atrial fibrillation April 2024 follows with Dr. Truddie Furrow   -Most recent visit was on 07/19/2023.  Noted to be on Toprol   #Elevated PSA 07/03/2023 with BPH   - The been following with [facility].  Since approximately June 2024 h per Dr. Parke Boll in September 2024 prostate cancer is considered unlikely.  His plan was for interventional radiology embolization of the prostate to make it shrink it and then remove the Foley.  According to the family on this visit 10/18/2023 he still has his Foley catheter.  They are not sure about the status of interventions here.   #Physical deconditioning and fatigue   -This is his biggest complaint of fatigue.  On this visit 10/18/2023 the family agreed that he is actually getting better.  He is now able to walk some distances without any help.  He is undergoing intense physical therapy exercises with [facility].  Today the patient felt little bit more fatigued but overall they do say that he is better.  His weight transfer and his physicality overall is better.  Still he was sitting in the wheelchair today.  #GI symptoms -Low appetite continues but is somewhat better - Nausea this continues to be a problem as of September 2024.Aaron Aas  There is some better perhaps.  The amount of Zofran  the using is less.  But is still a significant problem.  Wife thinks is associated with acid reflux.  On November 2024 ongoing and stable.   #Decub ulcer  -Being followed by wound care.  This was present in September 2024 but healed as of November 2020 for this visit.   #Goals of care  -Remains full code.  #Overall [place] symptom score appears to be stable/improving.     CT Chest data from date: SPET 2024  - personally visualized and independently interpreted :YES - my findings are: MILD ild AT BASE    OV  12/12/2023  Subjective:  Patient ID: Braulio Calender, male , DOB: 04-21-1930 , age 66 y.o. , MRN: [id number] , ADDRESS: [address] [place] [address] PCP [name], MD Patient Care Team: [name], MD as PCP - General (Internal Medicine) Alda Amas,  Trula Gable, MD as PCP - Cardiology (Cardiology) Ladon Pickler, MD as Referring Physician (Ophthalmology) Palma Bob, MD (Inactive) as Consulting Physician (Vascular Surgery) Tobin Forts, MD as Consulting Physician (Gastroenterology)  This Provider for this visit: Treatment Team:  Attending Provider: Maire Scot, MD    12/12/2023 -   Chief Complaint  Patient presents with   Follow-up    Pt states he is doing well, denies any concerns. States he would like to cut back on his oxygen .      HPI IGOR BISHOP 89 y.o. -returns for follow-up.  He celebrated his 59st birthday on Christmas Eve [birth year]24.  This upcoming weekend will be his 56th wedding anniversary.  His wife Amalia Badder is here.  The caretaker is in [place] visiting her family.  At this point in time he is off his walker he is off his cane.  He is able to walk at least 50 or 100 yards on flat ground without any assist.  Wife prefers for him to have a cane but he will not.  They both report that after starting methotrexate mid November 2024 there is dramatic improvement in his health.  He still on prednisone  15 mg/day.  They got a call [facility] to reduce the prednisone .  His anemia has nearly resolved.  He is more active now.  He wants to come off the oxygen .  Both at rest and with simple exercise of 200 feet or sitting and standing 10 times he did not desaturate.  He did have a CT angiogram with Dr. Charlotte Cookey in December 2024 and his ILD is still persistent but is not worse per the report.  They also wanted know about stopping Eliquis  but his PE was submassive and is less than a-year-old.  He is on low-dose.  I strongly advised that they continue this for  1-2 years and then reassess.  He still has his Foley catheter.  Wife reports that he is very committed to various community causes and if is not active he gets depressed.     OV 02/19/2024  Subjective:  Patient ID: Braulio Calender, male , DOB: 12-07-1929 , age 23 y.o. , MRN: [id number] , ADDRESS: [address] [place] [address] PCP [name], MD Patient Care Team: [name], MD as PCP - General (Internal Medicine) Wendie Hamburg, MD as PCP - Cardiology (Cardiology) Ladon Pickler, MD as Referring Physician (Ophthalmology) Palma Bob, MD (Inactive) as Consulting Physician (Vascular Surgery) Tobin Forts, MD as Consulting Physician (Gastroenterology)  This Provider for this visit: Treatment Team:  Attending Provider: Maire Scot, MD    02/19/2024 -   Chief Complaint  Patient presents with   Follow-up    ILD F/U     HPI Braulio Calender 89 y.o. -presents for his ILD.  Since his last visit there was some reports of desaturations at home.  We got a D-dimer it was elevated.  We got a VQ scan but there is no PE.  Because of the high D-dimer advised him to continue his Eliquis .  He is here today with his wife and caretaker Stana Ear.  He tells me he is on 1 hand that he wants to not be on oxygen .  I did tell him his resting pulse ox and exercise hypoxemia test were normal.  Yet at the same time family also tells me that when he is without oxygen  for a while he looks pale and tired and then when he puts his oxygen  on  he feels better.  His caretaker Stana Ear also tells me that if his oxygen  is not next to him he starts getting anxious.  I have indicated to him that he can definitely use his portable oxygen  for feeling of comfort but at this point in time he does not need oxygen  for rest or simple exercise within 200 feet.  He continues his intense physical therapy and this helps him but the wife feels after that he is fatigued.  Overall his quality of life is down  for the last 1 year ever since his admission.  He is frustrated by this.  He states his goal right now is to start driving again his son did take him for driving and that we have caught park a lot and he was able to do some driving.  He also wants to start playing golf again.  He has been visiting [place] to the extent possible.     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  [Daptomycin  given for suspected aortitis at [name]  March 2025: Was able to walk 200 feet without desaturation this very similar to January 2025.  CT scan to Dr. Bertrum Brodie looks very similar to August 2024.  Not much crackles heard.  Advised to follow expectant approach.  Antifibrotic's can be appropriate but it caused intense fatigue which she is already suffering from.     #History of pulm embolism April 2024 for submassive PE -     -He is on low-dose Eliquis  noW. D-dimer in July 2024: is 1.6 and Sept 2024 was 1.85. ECHO 08/28/23 shows Normal RV And is very reasuring. EF 50-55%. Aortic valve calcification +. Mild - mod stenosis . OPVerall reassuring.  No bleeding issues.  February 2025 D-dimer elevated VQ scan normal.  Advised to stay on low-dose Eliquis   #Iron deficiency anemia with heme positive stools.  Status post PRBC on 06/27/2023 for a hemoglobin of 8.1 g%- #Associated thrombocytosis and leukocytosis.  Initial heme-onc consult 07/05/2023   -Did see Dr. Rosaline Coma and his physician assistant Delores Fester on 07/05/2023.  Instead of bone marrow biopsy decided to pursue iron infusions.  Last hemoglobin check showed improvement in hemoglobin to 10.4 g% on 08/08/2023 and is significantly  improved.   -Most recent infusion was on 07/21/2023.   -CT abdomen August 03, 2023 just showed a lot of stool and constipation   -Dr. Elvin Hammer has indicated colonoscopy might be of significant risk .  Most recent follow-up with Dr. Elvin Hammer was 08/11/2023.  Continued conservative management has been recommended.  He was reassured by the CT scan on August 03, 2023.   -As of 10/18/2023 not getting blood transfusion iron infusions.  Hemoglobin 11/13/20540 was improved to 12.6 g%.  His color is better.   -January 30865 for hemoglobin in the 12's  #Elevated ESR greater since February 2024 and [facility] health medical records (according to primary care physician present for couple of years]    - No clear etiology known.  He in the past he has not responded to increase prednisone  for polymyalgia rheumatica.  I recheck this again on 08/08/2023 and is again greater than 110.  Dr. Fontaine Ice does not think this from tuberculosis.  At last visit referred to rheumatology.  He is did see Dr. France Ina at [facility] rheumatology on 09/20/2023.  External records reviewed.  Diagnosis of PMR is on the chart but vasculitis/rheumatoid arthritis is under the differential.  ESR was not checked at [facility] but CRP was elevated.  Rheumatoid factor was trace positive.  He has been started on methotrexate.  I did indicate to them that methotrexate onset of action could be anywhere from 4-12 weeks.  Currently methotrexate weekly 12.5 along with prednisone  12.5.  When he dropped his prednisone  he starts getting more short of breath and fatigue.  Seen by Dr. France Ina recently.     #History of SVT approximately atrial fibrillation April 2024 follows with Dr. Truddie Furrow   -Most recent visit was on 07/19/2023.  Noted to be on Toprol   #Elevated PSA 07/03/2023 with BPH   - The been following with [facility].  Since approximately June 2024 h per Dr. Parke Boll in September 2024 prostate cancer is considered unlikely.  His plan was for  interventional radiology embolization of the prostate to make it shrink it and then remove the Foley.  According to the family on this visit 10/18/2023 he still has his Foley catheter.  They are not sure about the status of interventions here.   #Physical deconditioning and fatigue   -This is his biggest complaint of fatigue.  He was able to walk 200 feet in the office in 1 minute and 35 seconds without assist at all.  He is doing intense physical therapy but he still complains of fatigue.  #GI symptoms -Low appetite continues but is somewhat better - Nausea this continues to be a problem as of September 2024.Aaron Aas  There is some better perhaps.  The amount of Zofran  the using is less.  But is still a significant problem.  Wife thinks is associated with acid reflux.  On November 2024 ongoing and stable.  Not much of an issue March 2025 visit.   #Decub ulcer  -Being followed by wound care.  This was present in September 2024 but healed as of November 2024 for this visit.   #Goals of care  -Remains full code.  #New issue -His serum proteins are high at [facility] March 2025.  The rheumatologist advised him to follow-up with his local oncologist Dr. Rosaline Coma who is an appointment with in July 2025.  I personally have sent a phone message to Dr. Rosaline Coma to see him earlier.  #Overall [place] symptom score appears to be stable/improving.   CT Chest data from date: FEb 2025  - personally visualized and independently interpreted : YEs - my findings are: SIMIALR to AUG 2024                                      IMPRESSION: 1. 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 Findings are stable from recent prior examinations but new or progressive from 12/02/2022, possibly due to intervening pneumonia, as seen on 03/05/2023. Findings are categorized as probable UIP per consensus guidelines: Diagnosis of Idiopathic Pulmonary Fibrosis: An  Official ATS/ERS/JRS/ALAT Clinical Practice Guideline. Am Annie Barton Crit Care Med Vol 198, Iss 5, [id number], Jul 29 2017. 2. 4.6 cm ascending aortic aneurysm and 5.3 cm distal aortic arch aneurysm. Ascending thoracic aortic aneurysm. Recommend semi-annual imaging followup by CTA  or MRA and referral to cardiothoracic surgery if not already obtained. This recommendation follows 2010 ACCF/AHA/AATS/ACR/ASA/SCA/SCAI/SIR/STS/SVM Guidelines for the Diagnosis and Management of Patients With Thoracic Aortic Disease. Circulation. 2010; 121: N562-Z308. Aortic aneurysm NOS (ICD10-I71.9) 3. Aortic atherosclerosis (ICD10-I70.0). Coronary artery calcification. 4. Enlarged pulmonic trunk, indicative of pulmonary arterial hypertension.     Electronically Signed   By: Shearon Denis M.D.   On: 01/22/2024 11:30   OV 04/30/2024  Subjective:  Patient ID: Braulio Calender, male , DOB: November 06, 1930 , age 69 y.o. , MRN: [id number] , ADDRESS: [address] [place] [address] PCP [name], MD Patient Care Team: [name], MD as PCP - General (Internal Medicine) Wendie Hamburg, MD as PCP - Cardiology (Cardiology) Ladon Pickler, MD as Referring Physician (Ophthalmology) Palma Bob, MD (Inactive) as Consulting Physician (Vascular Surgery) Tobin Forts, MD as Consulting Physician (Gastroenterology)  This Provider for this visit: Treatment Team:  Attending Provider: Maire Scot, MD    04/30/2024 -   Chief Complaint  Patient presents with   Interstitial Lung Disease     HPI JHOVANY WEIDINGER 89 y.o. -   #History of SVT approximately atrial fibrillation April 2024 follows with Dr. 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 D-dimer in July 2024: is 1.6 and Sept 2024 was 1.85. ECHO 08/28/23 shows Normal RV And is very reasuring. EF 50-55%. Aortic valve calcification +. Mild - mod stenosis . OPVerall reassuring.  No bleeding issues.  February 2025 D-dimer elevated VQ scan normal.  Advised to stay on low-dose Eliquis .  Remains on low-dose Eliquis  April 30, 2024.   #Iron deficiency anemia with heme positive stools.  Status post PRBC on 06/27/2023 for a hemoglobin of 8.1 g%- #Associated thrombocytosis and leukocytosis.  Initial heme-onc consult 07/05/2023   -Did see Dr. Rosaline Coma and his physician assistant Delores Fester on 07/05/2023.  Instead of bone marrow biopsy decided to pursue iron infusions.  Last hemoglobin check showed improvement in hemoglobin to 10.4 g% on 08/08/2023 and is significantly improved.   -Most recent infusion was on 07/21/2023.   -CT abdomen August 03, 2023 just showed a lot of stool and constipation   -Dr. Elvin Hammer has indicated colonoscopy might be of significant risk .  Most recent follow-up with Dr. Elvin Hammer was 08/11/2023.  Continued conservative management has been recommended.  He was reassured by the CT scan on August 03, 2023.   -As of 10/18/2023 not getting blood transfusion iron infusions.  Hemoglobin 11/13/20540 was improved to 12.6 g%.  His color is better.  - On April 30, 2024: Hemoglobin Apr 19, 2023 was 11.3 g%   #Elevated ESR greater since February 2024 and [facility] health medical records (according to primary care physician present for couple of years]    - No clear etiology known.  He in the past he has not responded to increase prednisone  for polymyalgia rheumatica.  I recheck this again on 08/08/2023 and is again greater than 110.  Dr. Fontaine Ice does not think this from tuberculosis.  At last visit referred to rheumatology.  He is did see Dr. France Ina at [facility] rheumatology on 09/20/2023.  External records reviewed.  Diagnosis of PMR is  on the chart but vasculitis/rheumatoid arthritis is under the differential.  ESR was not checked at [facility] but CRP was elevated.  Rheumatoid factor was trace positive.  He has been started on methotrexate.  I did indicate to them that methotrexate onset of action could be anywhere from 4-12 weeks.  Currently methotrexate weekly 12.5 along with prednisone  12.5.  When he dropped his prednisone  he starts getting more short of breath and fatigue.  Seen by Dr. France Ina recently.          #Elevated PSA 07/03/2023 with BPH   - The been following with Dr. [facility].  Since approximately June 2024 h per Dr. Parke Boll in September 2024 prostate cancer is considered unlikely.  His plan was for interventional radiology embolization of the prostate to make it shrink it and then remove the Foley.  According to the family on this visit 10/18/2023 he still has his Foley catheter.  He still has a Foley June 2025 visit.  They are not sure about the status of interventions here.   #Physical deconditioning and fatigue   -This is his biggest complaint of fatigue.  As of March 2025 he was able to walk 200 feet in the office in 1 minute and 35 seconds without assist at all.  He is doing intense physical therapy but he still complains of fatigue..  In the June 2025 visit the fatigue got worse after his admission in the third week of May 2025 for groin cellulitis.  But the caretaker Stana Ear says it is significantly getting better.Aaron Aas  He is so worried about his fatigue.  In fact this is one of the main reasons of not starting antifibrotic's.  She says that despite his complaints he is overall better.  We talked about muscle strengthening.  I recommended the concept of whey protein and also creatinine supplements.  I told him to talk to his physical therapist about these.  #GI symptoms -Low appetite continues but is somewhat better - Nausea this continues to be a problem as of September 2024.Aaron Aas  There is some better  perhaps.  The amount of Zofran  the using is less.  But is still a significant problem.  Wife thinks is associated with acid reflux.  On November 2024 ongoing and stable.  Not much of an issue March 2025 visit.  And by June 2025 visit he has gained weight.  His appetite is significantly better.  He is eating much better.    #Goals of care  -Remains full code.  # Elevated serum proteins March 2025 -His serum proteins are high at [facility] March 2025.  - He had bone marrow biopsy here 07/10/2024:Minor monoclonal B-cell population identified  = no specific diagnosis made.    ll#Interstitial lung disease/dyspnea on exertion   -Had flareup in April 2022 associate with PE and also daptomycin .  [Daptomycin  given for suspected aortitis at [facility]  March 2025: Was able to walk 200 feet without desaturation this very similar to January 2025.  CT scan to Dr. Bertrum Brodie in February 2025 looks very similar to August 2024.  Not much crackles heard.  Advised to follow expectant approach.  Antifibrotic's can be appropriate but it caused intense fatigue which she is already suffering from.  And again as of June 2025 visit he declined the antifibrotic's.  However a new one called Nerandomilast is going to get approved very likely.  In this case he might be a  candidate for this.  We took a shared decision making to get pulmonary function test in 6 to 8 weeks and reassess.   [place] Symptom Assessment Numerical Scale 0 is no problem -> 10 worst problem 08/08/2023   08/29/2023  10/17/2023  12/12/2023  02/20/2024  No Pain -> Worst pain 5 0 3    No Tiredness -> Worset tiredness 8 7 3 2    No Nausea -> Worst nausea 7 6 6 2    No Depression -> Worst depression 5 7 5 4    No Anxiety -> Worst Anxiety 6 6 6 6    No Drowsiness -> Worst Drowsiness 0 5 0    Best appetite-> Worst Appetitle 5 4 5     Best Feeling of well being -> Worst feeling 6 3 1     No dyspnea-> Worst dyspnea 1 4 2     Other problem (none ->  severe) x x x    Completed by  Care guver ad patietn Care giver Patient and care igive              Simple office walk 224 (66+46 x 2) feet Pod A at [facility] x  3 laps goal with forehead probe 08/08/2023  08/29/2023  10/17/2023  12/12/2023  02/19/2024  04/30/2024   O2 used ra ra  ra    Room air Raoom air  Number laps completed Sit stand x 5  Sitting in wheel chair  Sit stand stan x10, and 200 feet flat ground 200 feet x 1 lap in the office 200 feet x 1 lap  Comments about pace Slow but can do   VERY SLOW Completed in 1 minute and 35 seconds without any assist   Resting Pulse Ox/HR 100% and 125/min 97% RA At rest 100% RA at rest - forehead probe 99% ahd HR 73 98% with heart rate 94 97% and HR 86  Final Pulse Ox/HR 100% and 134/min   96% and HR 93 95% with heart rate 116 90% and HR 92  Desaturated </= 88% no       Desaturated <= 3% points no       Got Tachycardic >/= 90/min yes       Symptoms at end of test x   Not dyspneic    Miscellaneous comments tachycardia          PFT      No data to display             LAB RESULTS last 96 hours No results found.       has a past medical history of AAA (abdominal aortic aneurysm) (HCC), Abnormal EKG, Allergic rhinitis, Allergy, Benign neoplasm of colon (04/14/2010), Carpal tunnel syndrome, Cataract, Decubitus ulcer of coccygeal region, stage 2 (HCC) (07/06/2023), Degenerative disc disease, Disturbances metabolism of methionine, homocystine, and cystathionine (HCC), Elevated prostate specific antigen (PSA), Elevated PSA (07/06/2023), Hearing loss, Hyperlipidemia, ILD (interstitial lung disease) (HCC) (07/06/2023), Impotence of organic origin, Internal hemorrhoids, Leukocytosis (07/06/2023), Neck pain, Otosclerosis of both ears, Peripheral vascular disease (HCC), Plantar fasciitis, Polymyalgia rheumatica (HCC), Polymyalgia rheumatica (HCC) (07/06/2023), Rotator cuff syndrome of left shoulder, Scoliosis, and Shoulder pain.   reports that he  has never smoked. He has never used smokeless tobacco.  Past Surgical History:  Procedure Laterality Date   ABDOMINAL AORTIC ENDOVASCULAR STENT GRAFT N/A 08/16/2017   Procedure: ABDOMINAL AORTIC ENDOVASCULAR STENT GRAFT insertion;  Surgeon: Margherita Shell, MD;  Location: [facility] OR;  Service: Vascular;  Laterality: N/A;   Actinic keratosis removal  06/21/2010   Left shoulder; Dr. 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   Procedure: DUPUYTREN CONTRACTURE RELEASE RIGHT LONG, RING AND SMALL FINGERS;  Surgeon: Amelie Baize., MD;  Location: [facility];  Service: Orthopedics;  Laterality: Right;   Implant penile pump  2001   IR ANGIOGRAM PELVIS SELECTIVE OR SUPRASELECTIVE  12/11/2020   IR ANGIOGRAM SELECTIVE EACH ADDITIONAL VESSEL  12/11/2020   IR EMBO ARTERIAL NOT HEMORR HEMANG INC GUIDE ROADMAPPING  12/11/2020   IR RADIOLOGIST EVAL & MGMT  07/30/2020   IR RADIOLOGIST EVAL & MGMT  08/16/2023   IR US  GUIDE VASC ACCESS RIGHT  12/11/2020   Resection of appendix and tip of rectum  February 2005   STAPEDECTOMY Bilateral 1985, 1988   [employer]    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   Rosuvastatin  Other (See Comments)    Stopped taking due to feeling achy     Codeine Nausea And Vomiting   Morphine  And Codeine Nausea And Vomiting   Omnicef [Cefdinir] Other (See Comments)    Abdominal pain    Immunization History  Administered Date(s) Administered   Dtap, Unspecified 06/29/2010   Influenza, High Dose Seasonal PF 09/01/2017, 08/31/2022   Influenza, Quadrivalent, Recombinant, Inj, Pf 09/01/2018, 08/31/2019, 08/19/2020, 09/24/2021   Influenza,inj,Quad PF,6+ Mos 08/20/2013, 08/26/2014, 09/23/2015, 10/04/2016   Influenza-Unspecified 09/22/2006, 09/26/2007, 10/13/2012   PFIZER(Purple Top)SARS-COV-2 Vaccination 12/10/2019, 12/28/2019, 08/10/2020    Pneumococcal Conjugate-13 05/30/2018   Pneumococcal Polysaccharide-23 07/21/1998, 10/26/2022   Tdap 06/29/2010    Family History  Problem Relation Age of Onset   Heart disease Mother    Emphysema Mother    Leukemia Brother        Chronic lymphocytic leukemia   Colon cancer Neg Hx    Esophageal cancer Neg Hx    Rectal cancer Neg Hx    Stomach cancer Neg Hx      Current Outpatient Medications:    acetaminophen  (TYLENOL ) 325 MG tablet, Take 2 tablets (650 mg total) by mouth every 6 (six) hours as needed for mild pain (or Fever >/= 101)., Disp: , Rfl:    acyclovir  (ZOVIRAX ) 400 MG tablet, Take 400 mg by mouth 2 (two) times daily., Disp: , Rfl:    apixaban  (ELIQUIS ) 2.5 MG TABS tablet, Take 2.5 mg by mouth 2 (two) times daily., Disp: , Rfl:    butalbital -acetaminophen -caffeine  (FIORICET ) 50-325-40 MG tablet, Take 1 tablet by mouth daily as needed for headache., Disp: 14 tablet, Rfl: 0   cholecalciferol (VITAMIN D3) 25 MCG (1000 UNIT) tablet, Take 2,000 Units by mouth daily., Disp: , Rfl:    diclofenac  Sodium (VOLTAREN ) 1 % GEL, Apply 2 g topically 4 (four) times daily as needed (shoulder pain)., Disp: 2 g, Rfl: 0   docusate sodium  (COLACE) 100 MG capsule, Take 100 mg by mouth daily., Disp: , Rfl:    feeding supplement (ENSURE ENLIVE / ENSURE PLUS) LIQD, Take 237 mLs by mouth 3 (three) times daily between meals., Disp: 237 mL, Rfl: 12   finasteride  (PROSCAR ) 5 MG tablet, Take 5 mg by mouth daily., Disp: , Rfl:    fluorometholone  (FML) 0.1 % ophthalmic suspension, Place 1 drop into the right eye at bedtime. , Disp: , Rfl:    folic acid  (FOLVITE ) 1 MG tablet, Take 1 mg by mouth daily., Disp: , Rfl:    lansoprazole (PREVACID) 30  MG capsule, Take 30 mg by mouth 2 (two) times daily., Disp: , Rfl:    LORazepam  (ATIVAN ) 0.5 MG tablet, Take 0.5 mg by mouth daily. 0.25 am, 0.25 pm,  0.5 at night, Disp: , Rfl:    melatonin 5 MG TABS, Take 1 tablet (5 mg total) by mouth at bedtime., Disp: , Rfl:     methotrexate (RHEUMATREX) 2.5 MG tablet, Take 12.5 mg by mouth once a week., Disp: , Rfl:    metoprolol  succinate (TOPROL -XL) 25 MG 24 hr tablet, Take 1 tablet (25 mg total) by mouth daily., Disp: , Rfl:    mirtazapine  (REMERON ) 15 MG tablet, Take 15 mg by mouth at bedtime. Pt taking 30 mg, Disp: , Rfl:    Multiple Vitamins-Minerals (MULTIVITAMIN PO), Take 1 tablet by mouth daily. , Disp: , Rfl:    ondansetron  (ZOFRAN ) 4 MG tablet, Take 1 tablet (4 mg total) by mouth every 4 (four) hours as needed for nausea or vomiting., Disp: 100 tablet, Rfl: 6   pantoprazole  (PROTONIX ) 40 MG tablet, Take 1 tablet (40 mg total) by mouth daily before breakfast., Disp: 30 tablet, Rfl: 3   polyethylene glycol (MIRALAX  / GLYCOLAX ) 17 g packet, Take 17 g by mouth 2 (two) times daily., Disp: , Rfl:    predniSONE  (DELTASONE ) 10 MG tablet, Take 10 mg by mouth daily with breakfast., Disp: , Rfl:    predniSONE  (DELTASONE ) 2.5 MG tablet, Take 2.5 mg by mouth daily with breakfast., Disp: , Rfl:    promethazine  (PHENERGAN ) 12.5 MG tablet, Take 1 tablet (12.5 mg total) by mouth daily as needed for nausea or vomiting., Disp: 30 tablet, Rfl: 6   psyllium (REGULOID) 0.52 g capsule, Take 0.52 g by mouth daily., Disp: , Rfl:    senna-docusate (SENNA-S) 8.6-50 MG tablet, Take 1 tablet by mouth daily., Disp: , Rfl:    tamsulosin  (FLOMAX ) 0.4 MG CAPS capsule, Take 0.4 mg by mouth at bedtime. PT TAKES 2 CAPSULES AT HS, Disp: , Rfl:    T[place] 1200 MG CAPS, Take 1 tablet by mouth daily., Disp: , Rfl:    traMADol  (ULTRAM ) 50 MG tablet, Take 50 mg by mouth every 4 (four) hours as needed for moderate pain or severe pain., Disp: , Rfl:    zolpidem  (AMBIEN ) 5 MG tablet, Take 5 mg by mouth at bedtime as needed., Disp: , Rfl:       Objective:   Vitals:   04/30/24 1308  Pulse: 82  SpO2: 91%  Weight: 172 lb (78 kg)  Height: 6' (1.829 m)    Estimated body mass index is 23.33 kg/m as calculated from the following:   Height as of  this encounter: 6' (1.829 m).   Weight as of this encounter: 172 lb (78 kg).  @WEIGHTCHANGE @  [employer]   04/30/24 1308  Weight: 172 lb (78 kg)     Physical Exam   General: No distress.  Looks much better.  Color is much better.  Sitting on the wheelchair. O2 at rest: Has oxygen  on but room air at rest is fine. Cane present: no Sitting in wheel chair: yes Frail: Much better Obese: no Neuro: Alert and Oriented x 3. GCS 15. Speech normal Psych: Pleasant Resp:  Barrel Chest - no.  Wheeze - no, Crackles - ?, No overt respiratory distress CVS: Normal heart sounds. Murmurs - no Ext: Stigmata of Connective Tissue Disease - no HEENT: Normal upper airway. PEERL +. No post nasal drip  Assessment:       ICD-10-CM   1. Interstitial lung disease (HCC)  J84.9 Pulmonary function test    2. DOE (dyspnea on exertion)  R06.09 Pulmonary function test    3. Physical deconditioning  R53.81 Pulmonary function test         Plan:     Patient Instructions     ICD-10-CM   1. Interstitial lung disease (HCC)  J84.9     2. DOE (dyspnea on exertion)  R06.09     3. Physical deconditioning  R53.81       Your interstitial lung disease still persist.  Unclear if drops in oxygen  with walking the same or getting worse.  Antifibrotic therapy carries some amount of risk with side effects but there is a new 1 being approved in the next few months that might have less side effect profile.    Plan  - Now that you are more physically fit lets do a breathing test on you in 6 weeks and consider antifbrotic  - we have held this off due to fatigue as side effect - Continue to oxygen  as needed = try whey  protein 1 scoope daily (optimum nutrition good brand)  - try creatine supplement for muscle building (chec wih  PCP [name], MD and [place])  Follow-up -Return to see Dr. Bertrum Brodie in 6 weeks; 30-minute visit  - Depending on results discussed potential antifibrotic  therapy   FOLLOWUP Return in about 7 weeks (around 06/18/2024) for 30 min visit, with Dr Bertrum Brodie, Face to Face Visit.    SIGNATURE    Dr. Maire Scot, M.D., F.C.C.P,  Pulmonary and Critical Care Medicine Staff Physician, [facility] Director - Interstitial Lung Disease  Program  Pulmonary Fib[facility] at [facility] [place], [place], [address]  Pager: [phone], If no answer or between  15:00h - 7:00h: call [phone] Telephone: [phone]  1:37 PM 04/30/2024

## 2024-05-01 DIAGNOSIS — F0390 Unspecified dementia without behavioral disturbance: Secondary | ICD-10-CM | POA: Diagnosis not present

## 2024-05-01 DIAGNOSIS — I959 Hypotension, unspecified: Secondary | ICD-10-CM | POA: Diagnosis not present

## 2024-05-01 DIAGNOSIS — R269 Unspecified abnormalities of gait and mobility: Secondary | ICD-10-CM | POA: Diagnosis not present

## 2024-05-01 DIAGNOSIS — I480 Paroxysmal atrial fibrillation: Secondary | ICD-10-CM | POA: Diagnosis not present

## 2024-05-01 DIAGNOSIS — R5382 Chronic fatigue, unspecified: Secondary | ICD-10-CM | POA: Diagnosis not present

## 2024-05-01 DIAGNOSIS — D72829 Elevated white blood cell count, unspecified: Secondary | ICD-10-CM | POA: Diagnosis not present

## 2024-05-01 DIAGNOSIS — D638 Anemia in other chronic diseases classified elsewhere: Secondary | ICD-10-CM | POA: Diagnosis not present

## 2024-05-01 DIAGNOSIS — L89151 Pressure ulcer of sacral region, stage 1: Secondary | ICD-10-CM | POA: Diagnosis not present

## 2024-05-01 DIAGNOSIS — M79605 Pain in left leg: Secondary | ICD-10-CM | POA: Diagnosis not present

## 2024-05-01 DIAGNOSIS — F32A Depression, unspecified: Secondary | ICD-10-CM | POA: Diagnosis not present

## 2024-05-01 DIAGNOSIS — L03314 Cellulitis of groin: Secondary | ICD-10-CM | POA: Diagnosis not present

## 2024-05-02 ENCOUNTER — Telehealth: Admitting: Adult Health

## 2024-05-02 DIAGNOSIS — Z0389 Encounter for observation for other suspected diseases and conditions ruled out: Secondary | ICD-10-CM

## 2024-05-02 NOTE — Progress Notes (Signed)
Patient r/s appt.  ?

## 2024-05-03 ENCOUNTER — Encounter: Payer: Self-pay | Admitting: Adult Health

## 2024-05-03 ENCOUNTER — Telehealth (INDEPENDENT_AMBULATORY_CARE_PROVIDER_SITE_OTHER): Admitting: Adult Health

## 2024-05-03 DIAGNOSIS — F331 Major depressive disorder, recurrent, moderate: Secondary | ICD-10-CM | POA: Diagnosis not present

## 2024-05-03 NOTE — Progress Notes (Signed)
 [name] [id number] 1930/03/24 89 y.o.  Virtual Visit via Video Note  I connected with pt @ on 05/03/24 at  3:30 PM EDT by a video enabled telemedicine application and verified that I am speaking with the correct person using two identifiers.   I discussed the limitations of evaluation and management by telemedicine and the availability of in person appointments. The patient expressed understanding and agreed to proceed.  I discussed the assessment and treatment plan with the patient. The patient was provided an opportunity to ask questions and all were answered. The patient agreed with the plan and demonstrated an understanding of the instructions.   The patient was advised to call back or seek an in-person evaluation if the symptoms worsen or if the condition fails to improve as anticipated.  I provided 25 minutes of non-face-to-face time during this encounter.  The patient was located at home.  The provider was located at [facility].   Randall Camera, NP   Subjective:   Patient ID:  [name] is a 89 y.o. (DOB 06/04/1930) male.  Chief Complaint: No chief complaint on file.   HPI [name] presents for follow-up of MDD.   Patient seen today for medication management.  Accompanied by wife.   Describes mood today as "ok". Denies tearfulness. Mood symptoms - reports situational depression related to health concerns. Reports stable interest and motivation - involved in the community. Reports anxiety and irritability at times. Denies panic attacks. Reports worry, rumination and over thinking. Wife reports he seems more "down" in the afternoons.  Reports mood is lower. Stating "I feel like I'm doing ok considering". Taking medications as prescribed, but is willing to consider other options for mood stabilization. Energy levels lower. Active, exercises daily.   Enjoys some usual interests and activities. Married 50 years - 2 grown sons. Spending time with  family. Appetite adequate. Weight loss 155 from 166 pounds.  Sleeps well most nights. Averages 9 to 10 hours. Focus and concentration difficulties. Completing tasks. Retired.  Denies SI or HI.  Denies AH or VH. Denies self harm. Denies substance use.  Previous medication trials: Denies  Review of Systems:  Review of Systems  Musculoskeletal:  Negative for gait problem.  Neurological:  Negative for tremors.  Psychiatric/Behavioral:         Please refer to HPI    Medications: I have reviewed the patient's current medications.  Current Outpatient Medications  Medication Sig Dispense Refill   acetaminophen  (TYLENOL ) 325 MG tablet Take 2 tablets (650 mg total) by mouth every 6 (six) hours as needed for mild pain (or Fever >/= 101).     acyclovir  (ZOVIRAX ) 400 MG tablet Take 400 mg by mouth 2 (two) times daily.     apixaban  (ELIQUIS ) 2.5 MG TABS tablet Take 2.5 mg by mouth 2 (two) times daily.     butalbital -acetaminophen -caffeine  (FIORICET ) 50-325-40 MG tablet Take 1 tablet by mouth daily as needed for headache. 14 tablet 0   cholecalciferol (VITAMIN D3) 25 MCG (1000 UNIT) tablet Take 2,000 Units by mouth daily.     diclofenac  Sodium (VOLTAREN ) 1 % GEL Apply 2 g topically 4 (four) times daily as needed (shoulder pain). 2 g 0   docusate sodium  (COLACE) 100 MG capsule Take 100 mg by mouth daily.     feeding supplement (ENSURE ENLIVE / ENSURE PLUS) LIQD Take 237 mLs by mouth 3 (three) times daily between meals. 237 mL 12   finasteride  (PROSCAR ) 5 MG tablet Take  5 mg by mouth daily.     fluorometholone  (FML) 0.1 % ophthalmic suspension Place 1 drop into the right eye at bedtime.      folic acid  (FOLVITE ) 1 MG tablet Take 1 mg by mouth daily.     lansoprazole (PREVACID) 30 MG capsule Take 30 mg by mouth 2 (two) times daily.     LORazepam  (ATIVAN ) 0.5 MG tablet Take 0.5 mg by mouth daily. 0.25 am, 0.25 pm,  0.5 at night     melatonin 5 MG TABS Take 1 tablet (5 mg total) by mouth at bedtime.      methotrexate (RHEUMATREX) 2.5 MG tablet Take 12.5 mg by mouth once a week.     metoprolol  succinate (TOPROL -XL) 25 MG 24 hr tablet Take 1 tablet (25 mg total) by mouth daily.     mirtazapine  (REMERON ) 15 MG tablet Take 15 mg by mouth at bedtime. Pt taking 30 mg     Multiple Vitamins-Minerals (MULTIVITAMIN PO) Take 1 tablet by mouth daily.      ondansetron  (ZOFRAN ) 4 MG tablet Take 1 tablet (4 mg total) by mouth every 4 (four) hours as needed for nausea or vomiting. 100 tablet 6   pantoprazole  (PROTONIX ) 40 MG tablet Take 1 tablet (40 mg total) by mouth daily before breakfast. 30 tablet 3   polyethylene glycol (MIRALAX  / GLYCOLAX ) 17 g packet Take 17 g by mouth 2 (two) times daily.     predniSONE  (DELTASONE ) 10 MG tablet Take 10 mg by mouth daily with breakfast.     predniSONE  (DELTASONE ) 2.5 MG tablet Take 2.5 mg by mouth daily with breakfast.     promethazine  (PHENERGAN ) 12.5 MG tablet Take 1 tablet (12.5 mg total) by mouth daily as needed for nausea or vomiting. 30 tablet 6   psyllium (REGULOID) 0.52 g capsule Take 0.52 g by mouth daily.     senna-docusate (SENNA-S) 8.6-50 MG tablet Take 1 tablet by mouth daily.     tamsulosin  (FLOMAX ) 0.4 MG CAPS capsule Take 0.4 mg by mouth at bedtime. PT TAKES 2 CAPSULES AT HS     Tart Cherry 1200 MG CAPS Take 1 tablet by mouth daily.     traMADol  (ULTRAM ) 50 MG tablet Take 50 mg by mouth every 4 (four) hours as needed for moderate pain or severe pain.     zolpidem  (AMBIEN ) 5 MG tablet Take 5 mg by mouth at bedtime as needed.     No current facility-administered medications for this visit.    Medication Side Effects: None  Allergies:  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   Rosuvastatin  Other (See Comments)    Stopped taking due to feeling achy     Codeine Nausea And Vomiting   Morphine  And Codeine Nausea And Vomiting   Omnicef [Cefdinir] Other (See Comments)    Abdominal pain     Past Medical History:  Diagnosis Date   AAA (abdominal aortic aneurysm) (HCC)    Abnormal EKG    Left atrial abnormality   Allergic rhinitis    Allergy    Benign neoplasm of colon 04/14/2010   3 small polyps on colonoscopy by Dr. Elvin Hammer   Carpal tunnel syndrome    Cataract    Decubitus ulcer of coccygeal region, stage 2 (HCC) 07/06/2023   Degenerative disc disease    Disturbances metabolism of methionine, homocystine, and cystathionine (HCC)    Elevated homocysteine   Elevated prostate specific antigen (PSA)  Elevated PSA 07/06/2023   Hearing loss    Hyperlipidemia    ILD (interstitial lung disease) (HCC) 07/06/2023   Impotence of organic origin    Penile implant   Internal hemorrhoids    Leukocytosis 07/06/2023   Neck pain    Otosclerosis of both ears    Peripheral vascular disease (HCC)    Bilateral femoral bruit   Plantar fasciitis    Polymyalgia rheumatica (HCC)    Polymyalgia rheumatica (HCC) 07/06/2023   Rotator cuff syndrome of left shoulder    Scoliosis    Shoulder pain     Family History  Problem Relation Age of Onset   Heart disease Mother    Emphysema Mother    Leukemia Brother        Chronic lymphocytic leukemia   Colon cancer Neg Hx    Esophageal cancer Neg Hx    Rectal cancer Neg Hx    Stomach cancer Neg Hx     Social History   Socioeconomic History   Marital status: Married    Spouse name: Franciso Dierks   Number of children: 2   Years of education: Not on file   Highest education level: Not on file  Occupational History   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   Social Drivers of Corporate investment banker Strain: Low Risk  (03/07/2023)   Overall Financial Resource Strain (CARDIA)    Difficulty of Paying Living Expenses: Not hard at all  Food Insecurity: No Food Insecurity (04/15/2024)   Hunger Vital Sign    Worried About Running Out of Food in the Last Year: Never true    Ran Out of Food in the Last Year: Never true  Transportation Needs: No Transportation Needs (04/15/2024)   PRAPARE - Administrator, Civil Service (Medical): No    Lack of Transportation (Non-Medical): No  Physical Activity: Not on file  Stress: Not on file  Social Connections: Socially Integrated (04/15/2024)   Social Connection and Isolation Panel [NHANES]    Frequency of Communication with Friends and Family: More than three times a week    Frequency of Social Gatherings with Friends and Family: More than three times a week    Attends Religious Services: More than 4 times per year    Active Member of [employer] or Organizations: Yes    Attends Banker Meetings: More than 4 times per year    Marital Status: Married  Catering manager Violence: Not At Risk (04/15/2024)   Humiliation, Afraid, Rape, and Kick questionnaire    Fear of Current or Ex-Partner: No    Emotionally Abused: No    Physically Abused: No    Sexually Abused: No    Past Medical History, Surgical history, Social history, and Family history were reviewed and updated as appropriate.   Please see review of systems for further details on the patient's review from today.   Objective:   Physical Exam:  There were no vitals taken  for this visit.  Physical Exam Constitutional:      General: He is not in acute distress. Musculoskeletal:        General: No deformity.  Neurological:     Mental Status: He is alert and oriented to person, place, and time.     Coordination: Coordination normal.   Psychiatric:        Attention and Perception: Attention and perception normal. He does not perceive auditory or visual hallucinations.        Mood and Affect: Mood normal. Mood is not anxious or depressed. Affect is not labile, blunt, angry or inappropriate.        Speech: Speech normal.        Behavior: Behavior normal.        Thought Content: Thought content normal. Thought content is not paranoid or delusional. Thought content does not include homicidal or suicidal ideation. Thought content does not include homicidal or suicidal plan.        Cognition and Memory: Cognition and memory normal.        Judgment: Judgment normal.     Comments: Insight intact     Lab Review:     Component Value Date/Time   NA 133 (L) 04/18/2024 0553   NA 142 03/21/2016 0819   K 4.2 04/18/2024 0553   CL 100 04/18/2024 0553   CO2 27 04/18/2024 0553   GLUCOSE 131 (H) 04/18/2024 0553   BUN 32 (H) 04/18/2024 0553   BUN 21 03/21/2016 0819   CREATININE 1.16 04/18/2024 0553   CREATININE 1.04 02/29/2024 1007   CREATININE 0.91 05/24/2017 0922   CALCIUM  8.0 (L) 04/18/2024 0553   PROT 6.5 04/15/2024 1712   PROT 6.4 03/21/2016 0819   ALBUMIN  3.5 04/15/2024 1712   ALBUMIN  3.9 03/21/2016 0819   AST 23 04/15/2024 1712   AST 19 02/29/2024 1007   ALT 26 04/15/2024 1712   ALT 27 02/29/2024 1007   ALKPHOS 93 04/15/2024 1712   BILITOT 0.4 04/15/2024 1712   BILITOT 0.4 02/29/2024 1007   GFRNONAA 59 (L) 04/18/2024 0553   GFRNONAA >60 02/29/2024 1007   GFRNONAA 77 05/24/2017 0922   GFRAA >60 01/24/2018 1402   GFRAA 89 05/24/2017 0922       Component Value Date/Time   WBC 16.2 (H) 04/18/2024 0553   RBC 3.31 (L) 04/18/2024 0553   HGB 11.3 (L) 04/18/2024 0553   HGB 12.1 (L) 02/29/2024 1007   HCT 34.9 (L) 04/18/2024 0553   PLT 232 04/18/2024 0553   PLT 282 02/29/2024 1007   MCV 105.4 (H) 04/18/2024 0553   MCH 34.1 (H) 04/18/2024 0553   MCHC 32.4 04/18/2024 0553   RDW 15.3 04/18/2024 0553   RDW 14.1  02/14/2014 0821   LYMPHSABS 1.0 04/15/2024 1712   LYMPHSABS 1.6 02/14/2014 0821   MONOABS 1.0 04/15/2024 1712   EOSABS 0.0 04/15/2024 1712   EOSABS 0.2 02/14/2014 0821   BASOSABS 0.0 04/15/2024 1712   BASOSABS 0.0 02/14/2014 0821    No results found for: "POCLITH", "LITHIUM"   No results found for: "PHENYTOIN", "PHENOBARB", "VALPROATE", "CBMZ"   .res Assessment: Plan:    Plan:  PDMP reviewed  Patient to continue:  Lorazepam  0.5mg  - 3:30, 6:30 and bedtime Ambien  5mg  at hs Remeron  15mg  - 2 at bedtime   RTC as needed  25 minutes spent dedicated to the care of this patient on the date of this encounter to include pre-visit review of records, ordering of medication, post visit documentation, and face-to-face  time with the patient discussing depression. Discussed changing timing of mirtazapine  and continuing current medication regimen. Patient will take Remeron  15mg  1/2 in the afternoon and 1 and 1/2 tablets at bedtime for mood symptoms.  Patient advised to contact office with any questions, adverse effects, or acute worsening in signs and symptoms.  Discussed potential benefits, risk, and side effects of benzodiazepines to include potential risk of tolerance and dependence, as well as possible drowsiness.  Advised patient not to drive if experiencing drowsiness and to take lowest possible effective dose to minimize risk of dependence and tolerance.    There are no diagnoses linked to this encounter.   Please see After Visit Summary for patient specific instructions.  Future Appointments  Date Time Provider Department Center  05/16/2024  3:20 PM Reynold Caves, MD [facility] None  05/21/2024  1:20 PM Wendie Hamburg, MD [facility] H&V  06/06/2024  2:30 PM [facility] None  06/06/2024  3:00 PM Ander Bame, MD [facility] None  06/11/2024  4:00 PM [facility] None  07/23/2024  1:45 PM Maire Scot, MD [facility] None    No orders of the  defined types were placed in this encounter.     -------------------------------

## 2024-05-06 DIAGNOSIS — D72829 Elevated white blood cell count, unspecified: Secondary | ICD-10-CM | POA: Diagnosis not present

## 2024-05-06 DIAGNOSIS — R269 Unspecified abnormalities of gait and mobility: Secondary | ICD-10-CM | POA: Diagnosis not present

## 2024-05-06 DIAGNOSIS — R000 Tachycardia, unspecified: Secondary | ICD-10-CM | POA: Diagnosis not present

## 2024-05-06 DIAGNOSIS — M533 Sacrococcygeal disorders, not elsewhere classified: Secondary | ICD-10-CM | POA: Diagnosis not present

## 2024-05-06 DIAGNOSIS — L559 Sunburn, unspecified: Secondary | ICD-10-CM | POA: Diagnosis not present

## 2024-05-06 DIAGNOSIS — R5382 Chronic fatigue, unspecified: Secondary | ICD-10-CM | POA: Diagnosis not present

## 2024-05-06 DIAGNOSIS — I480 Paroxysmal atrial fibrillation: Secondary | ICD-10-CM | POA: Diagnosis not present

## 2024-05-06 DIAGNOSIS — M79605 Pain in left leg: Secondary | ICD-10-CM | POA: Diagnosis not present

## 2024-05-06 DIAGNOSIS — L03314 Cellulitis of groin: Secondary | ICD-10-CM | POA: Diagnosis not present

## 2024-05-06 DIAGNOSIS — D638 Anemia in other chronic diseases classified elsewhere: Secondary | ICD-10-CM | POA: Diagnosis not present

## 2024-05-08 DIAGNOSIS — R338 Other retention of urine: Secondary | ICD-10-CM | POA: Diagnosis not present

## 2024-05-08 DIAGNOSIS — N5201 Erectile dysfunction due to arterial insufficiency: Secondary | ICD-10-CM | POA: Diagnosis not present

## 2024-05-08 DIAGNOSIS — R8271 Bacteriuria: Secondary | ICD-10-CM | POA: Diagnosis not present

## 2024-05-08 DIAGNOSIS — N401 Enlarged prostate with lower urinary tract symptoms: Secondary | ICD-10-CM | POA: Diagnosis not present

## 2024-05-13 ENCOUNTER — Other Ambulatory Visit (HOSPITAL_COMMUNITY): Payer: Self-pay | Admitting: Urology

## 2024-05-13 DIAGNOSIS — M533 Sacrococcygeal disorders, not elsewhere classified: Secondary | ICD-10-CM | POA: Diagnosis not present

## 2024-05-13 DIAGNOSIS — D72829 Elevated white blood cell count, unspecified: Secondary | ICD-10-CM | POA: Diagnosis not present

## 2024-05-13 DIAGNOSIS — R339 Retention of urine, unspecified: Secondary | ICD-10-CM

## 2024-05-13 DIAGNOSIS — M79605 Pain in left leg: Secondary | ICD-10-CM | POA: Diagnosis not present

## 2024-05-13 DIAGNOSIS — D638 Anemia in other chronic diseases classified elsewhere: Secondary | ICD-10-CM | POA: Diagnosis not present

## 2024-05-13 DIAGNOSIS — R000 Tachycardia, unspecified: Secondary | ICD-10-CM | POA: Diagnosis not present

## 2024-05-13 DIAGNOSIS — L89151 Pressure ulcer of sacral region, stage 1: Secondary | ICD-10-CM | POA: Diagnosis not present

## 2024-05-13 DIAGNOSIS — I480 Paroxysmal atrial fibrillation: Secondary | ICD-10-CM | POA: Diagnosis not present

## 2024-05-13 DIAGNOSIS — N138 Other obstructive and reflux uropathy: Secondary | ICD-10-CM

## 2024-05-13 DIAGNOSIS — R5382 Chronic fatigue, unspecified: Secondary | ICD-10-CM | POA: Diagnosis not present

## 2024-05-13 DIAGNOSIS — R269 Unspecified abnormalities of gait and mobility: Secondary | ICD-10-CM | POA: Diagnosis not present

## 2024-05-13 DIAGNOSIS — L03314 Cellulitis of groin: Secondary | ICD-10-CM | POA: Diagnosis not present

## 2024-05-13 DIAGNOSIS — L559 Sunburn, unspecified: Secondary | ICD-10-CM | POA: Diagnosis not present

## 2024-05-16 ENCOUNTER — Ambulatory Visit (INDEPENDENT_AMBULATORY_CARE_PROVIDER_SITE_OTHER): Admitting: Otolaryngology

## 2024-05-16 ENCOUNTER — Encounter (INDEPENDENT_AMBULATORY_CARE_PROVIDER_SITE_OTHER): Payer: Self-pay | Admitting: Otolaryngology

## 2024-05-16 VITALS — BP 112/74 | HR 104 | Ht 72.0 in | Wt 166.0 lb

## 2024-05-16 DIAGNOSIS — H6123 Impacted cerumen, bilateral: Secondary | ICD-10-CM | POA: Insufficient documentation

## 2024-05-16 NOTE — Progress Notes (Signed)
 Patient ID: [name], male   DOB: 01/28/1930, 89 y.o.   MRN: [id number]  Procedure: Bilateral cerumen disimpaction.   Indication: Cerumen impaction, resulting in ear discomfort and conductive hearing loss.   Description: The patient is placed supine on the operating table. Under the operating microscope, the right ear canal is examined and is noted to be impacted with cerumen. The cerumen is carefully removed with a combination of suction catheters, cerumen curette, and alligator forceps. After the cerumen removal, the ear canal and tympanic membrane are noted to be normal. No middle ear effusion is noted. The same procedure is then repeated on the left side without exception. The patient tolerated the procedure well.  Follow-up care:  The patient is instructed not to use Q-tips to clean the ear canals. The patient will follow up in 6 months.

## 2024-05-20 DIAGNOSIS — R269 Unspecified abnormalities of gait and mobility: Secondary | ICD-10-CM | POA: Diagnosis not present

## 2024-05-20 DIAGNOSIS — R5382 Chronic fatigue, unspecified: Secondary | ICD-10-CM | POA: Diagnosis not present

## 2024-05-20 DIAGNOSIS — M79605 Pain in left leg: Secondary | ICD-10-CM | POA: Diagnosis not present

## 2024-05-20 NOTE — Progress Notes (Unsigned)
 Cardiology Office Note:    Date:  05/21/2024   ID:  [name], DOB Jul 18, 1930, MRN [id number]  PCP:  [name]  Cardiologist:  [name]  Electrophysiologist:  None   Referring Alexander: [name]   Chief Complaint  Patient presents with   Atrial Fibrillation   Follow-up    History of Present Illness:    [name] is a 89 y.o. male with a hx of abdominal aortic aneurysm status post stent graft in 2018, chronic diastolic heart failure, pulmonary embolism 02/2023, hyperlipidemia, polymyalgia rheumatica who presents for follow-up.  He was referred by Dr Randall for evaluation of chest pain, initially seen 04/28/2023.  Admitted for 03/05/2023 with acute hypoxic respiratory failure in setting of pneumonia and acute PE.  He was seen by cardiology during admission for atrial fibrillation, thought to be secondary to acute pulmonary issues.  He converted to sinus rhythm on IV amiodarone  and was discharged on Lopressor  and Eliquis .  Echocardiogram 03/06/2023 showed EF 55 to 60%, abnormal septal motion, normal RV size/systolic function.  Zio patch x 8 days 04/2023 showed 1966 episodes of SVT with longest lasting 2 minutes with average rate 160 bpm, 1 episode of NSVT lasting 8 beats, frequent PACs (6.2%) and occasional PVCs (2.3%).  Echocardiogram 07/2023 showed EF 50-55%, normal RV function, mild to moderate AS, dilatation of ascending aorta measuring 43mm.  Since last clinic visit, he reports he is doing okay.  Has had UTI, completed antibiotics over the weekend.  Also has had some low BPs, down to 80s over 50s, his metoprolol  was discontinued.  Denies any chest pain, dyspnea, lightheadedness, syncope, lower extremity edema, or palpitations.  Taking Eliquis , denies any bleeding issues.    Past Medical History:  Diagnosis Date   AAA (abdominal aortic aneurysm) (HCC)    Abnormal EKG    Left atrial abnormality   Allergic rhinitis    Allergy    Benign neoplasm of colon  04/14/2010   3 small polyps on colonoscopy by Dr. [name]   Carpal tunnel syndrome    Cataract    Decubitus ulcer of coccygeal region, stage 2 (HCC) 07/06/2023   Degenerative disc disease    Disturbances metabolism of methionine, homocystine, and cystathionine (HCC)    Elevated homocysteine   Elevated prostate specific antigen (PSA)    Elevated PSA 07/06/2023   Hearing loss    Hyperlipidemia    ILD (interstitial lung disease) (HCC) 07/06/2023   Impotence of organic origin    Penile implant   Internal hemorrhoids    Leukocytosis 07/06/2023   Neck pain    Otosclerosis of both ears    Peripheral vascular disease (HCC)    Bilateral femoral bruit   Plantar fasciitis    Polymyalgia rheumatica (HCC)    Polymyalgia rheumatica (HCC) 07/06/2023   Rotator cuff syndrome of left shoulder    Scoliosis    Shoulder pain     Past Surgical History:  Procedure Laterality Date   ABDOMINAL AORTIC ENDOVASCULAR STENT GRAFT N/A 08/16/2017   Procedure: ABDOMINAL AORTIC ENDOVASCULAR STENT GRAFT insertion;  Surgeon: Serene Gaile ORN, Alexander;  Location: [facility];  Service: Vascular;  Laterality: N/A;   Actinic keratosis removal  06/21/2010   Left shoulder; Dr. Jadine   COLONOSCOPY     COLONOSCOPY W/ POLYPECTOMY     DUPUYTREN CONTRACTURE RELEASE Right 12/05/2013   Procedure: DUPUYTREN CONTRACTURE RELEASE RIGHT LONG, RING AND SMALL FINGERS;  Surgeon: [name]., Alexander;  Location: [facility];  Service: Orthopedics;  Laterality: Right;   Implant penile pump  2001   IR ANGIOGRAM PELVIS SELECTIVE OR SUPRASELECTIVE  12/11/2020   IR ANGIOGRAM SELECTIVE EACH ADDITIONAL VESSEL  12/11/2020   IR EMBO ARTERIAL NOT HEMORR HEMANG INC GUIDE ROADMAPPING  12/11/2020   IR RADIOLOGIST EVAL & MGMT  07/30/2020   IR RADIOLOGIST EVAL & MGMT  08/16/2023   IR US  GUIDE VASC ACCESS RIGHT  12/11/2020   Resection of appendix and tip of rectum  February 2005   STAPEDECTOMY Bilateral 1985, 1988   [employer]     Current Medications: Current Meds  Medication Sig   acetaminophen  (TYLENOL ) 325 MG tablet Take 2 tablets (650 mg total) by mouth every 6 (six) hours as needed for mild pain (or Fever >/= 101).   acyclovir  (ZOVIRAX ) 400 MG tablet Take 400 mg by mouth 2 (two) times daily.   apixaban  (ELIQUIS ) 2.5 MG TABS tablet Take 2.5 mg by mouth 2 (two) times daily.   butalbital -acetaminophen -caffeine  (FIORICET ) 50-325-40 MG tablet Take 1 tablet by mouth daily as needed for headache.   cholecalciferol (VITAMIN D3) 25 MCG (1000 UNIT) tablet Take 2,000 Units by mouth daily.   diclofenac  Sodium (VOLTAREN ) 1 % GEL Apply 2 g topically 4 (four) times daily as needed (shoulder pain).   docusate sodium  (COLACE) 100 MG capsule Take 100 mg by mouth daily.   feeding supplement (ENSURE ENLIVE / ENSURE PLUS) LIQD Take 237 mLs by mouth 3 (three) times daily between meals.   finasteride  (PROSCAR ) 5 MG tablet Take 5 mg by mouth daily.   fluorometholone  (FML) 0.1 % ophthalmic suspension Place 1 drop into the right eye at bedtime.    folic acid  (FOLVITE ) 1 MG tablet Take 1 mg by mouth daily.   lansoprazole (PREVACID) 30 MG capsule Take 30 mg by mouth 2 (two) times daily.   LORazepam  (ATIVAN ) 0.5 MG tablet Take 0.5 mg by mouth daily. 0.25 am, 0.25 pm,  0.5 at night   melatonin 5 MG TABS Take 1 tablet (5 mg total) by mouth at bedtime.   methotrexate (RHEUMATREX) 2.5 MG tablet Take 12.5 mg by mouth once a week.   mirtazapine  (REMERON ) 15 MG tablet Take 15 mg by mouth at bedtime. Pt taking 30 mg   Multiple Vitamins-Minerals (MULTIVITAMIN PO) Take 1 tablet by mouth daily.    ondansetron  (ZOFRAN ) 4 MG tablet Take 1 tablet (4 mg total) by mouth every 4 (four) hours as needed for nausea or vomiting.   pantoprazole  (PROTONIX ) 40 MG tablet Take 1 tablet (40 mg total) by mouth daily before breakfast.   polyethylene glycol (MIRALAX  / GLYCOLAX ) 17 g packet Take 17 g by mouth 2 (two) times daily.   predniSONE  (DELTASONE ) 10 MG tablet  Take 10 mg by mouth daily with breakfast.   predniSONE  (DELTASONE ) 2.5 MG tablet Take 2.5 mg by mouth daily with breakfast.   promethazine  (PHENERGAN ) 12.5 MG tablet Take 1 tablet (12.5 mg total) by mouth daily as needed for nausea or vomiting.   psyllium (REGULOID) 0.52 g capsule Take 0.52 g by mouth daily.   senna-docusate (SENNA-S) 8.6-50 MG tablet Take 1 tablet by mouth daily.   tamsulosin  (FLOMAX ) 0.4 MG CAPS capsule Take 0.4 mg by mouth at bedtime. PT TAKES 2 CAPSULES AT HS   Tart Cherry 1200 MG CAPS Take 1 tablet by mouth daily.   traMADol  (ULTRAM ) 50 MG tablet Take 50 mg by mouth every 4 (four) hours as needed for moderate pain or severe  pain.   zolpidem  (AMBIEN ) 5 MG tablet Take 5 mg by mouth at bedtime as needed.     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   Social Drivers of Corporate investment banker Strain: Low Risk  (03/07/2023)   Overall Financial Resource Strain (CARDIA)    Difficulty of Paying Living Expenses: Not hard at all  Food Insecurity: No Food Insecurity (04/15/2024)   Hunger Vital Sign    Worried About Running Out of Food in the Last Year: Never true    Ran Out of Food in the Last Year: Never  true  Transportation Needs: No Transportation Needs (04/15/2024)   PRAPARE - Administrator, Civil Service (Medical): No    Lack of Transportation (Non-Medical): No  Physical Activity: Not on file  Stress: Not on file  Social Connections: Socially Integrated (04/15/2024)   Social Connection and Isolation Panel    Frequency of Communication with Friends and Family: More than three times a week    Frequency of Social Gatherings with Friends and Family: More than three times a week    Attends Religious Services: More than 4 times per year    Active Member of [employer] or Organizations: Yes    Attends Engineer, structural: More than 4 times per year    Marital Status: Married     Family History: The patient's family history includes Emphysema in his mother; Heart disease in his mother; Leukemia in his brother. There is no history of Colon cancer, Esophageal cancer, Rectal cancer, or Stomach cancer.  ROS:   Please see the history of present illness.     All other systems reviewed and are negative.  EKGs/Labs/Other Studies Reviewed:    The following studies were reviewed today:   EKG:   04/28/2023: Sinus tachycardia, PVCs, rate 109, LVH 11/15/23: NSR, rate 84, LVH 05/21/2024: Normal sinus rhythm, rate 93, left axis deviation, LVH, poor R wave progression  Recent Labs: 12/27/2023: Pro B Natriuretic peptide (BNP) 99.0 04/15/2024: ALT 26 04/18/2024: BUN 32; Creatinine, Ser 1.16; Hemoglobin 11.3; Platelets 232; Potassium 4.2; Sodium 133  Recent Lipid Panel    Component Value Date/Time   CHOL 148 09/26/2016 0804   CHOL 138 03/21/2016 0819   TRIG 71 09/26/2016 0804   HDL 52 09/26/2016 0804   HDL 56 03/21/2016 0819   CHOLHDL 2.8 09/26/2016 0804   VLDL 14 09/26/2016 0804   LDLCALC 82 09/26/2016 0804   LDLCALC 68 03/21/2016 0819    Physical Exam:    VS:  BP 115/77 (BP Location: Left Arm, Patient Position: Sitting, Cuff Size: Normal)   Pulse 91  Resp 16   Ht 6'  (1.829 m)   Wt 173 lb 3.2 oz (78.6 kg)   SpO2 95%   BMI 23.49 kg/m     Wt Readings from Last 3 Encounters:  05/21/24 173 lb 3.2 oz (78.6 kg)  05/16/24 166 lb (75.3 kg)  04/30/24 172 lb (78 kg)     GEN:  Well nourished, well developed in no acute distress HEENT: Normal NECK: No JVD; No carotid bruits CARDIAC: RRR,2/6 systolic murmur RESPIRATORY:  Clear to auscultation without rales, wheezing or rhonchi  ABDOMEN: Soft, non-tender, non-distended MUSCULOSKELETAL:  No edema; No deformity  SKIN: Warm and dry NEUROLOGIC:  Alert and oriented x 3 PSYCHIATRIC:  Normal affect   ASSESSMENT:    1. PAF (paroxysmal atrial fibrillation) (HCC)   2. Aortic valve stenosis, etiology of cardiac valve disease unspecified   3. SVT (supraventricular tachycardia) (HCC)       PLAN:    Paroxysmal atrial fibrillation: Noted during hospitalization 02/2023.  Started on IV amiodarone  and converted to sinus rhythm.  Discharged on Lopressor  and Eliquis .  Echocardiogram 03/06/2023 showed EF 55 to 60%, abnormal septal motion, normal RV size/systolic function. -Continue Eliquis  - Appears to be maintaining sinus rhythm - Toprol -XL discontinued given low BP  SVT: Zio patch x 8 days 04/2023 showed 1966 episodes of SVT with longest lasting 2 minutes with average rate 160 bpm, 1 episode of NSVT lasting 8 beats, frequent PACs (6.2%) and occasional PVCs (2.3%). - Toprol -XL discontinued given low BP  Aortic stenosis: mild-moderate on echocardiogram 07/2023.  Will monitor, plan repeat echocardiogram in 1 year  PE/DVT: Admitted with acute PE 02/2023.  Continue Eliquis   ?  Aortitis: Completed course of antibiotics per ID 03/23/2023  AAA: Follows with vascular surgery, max aortic diameter 6.6 cm with delayed type II endoleak  Thoracic aortic aneurysm: Measures 5.1 cm, follows with vascular surgery  Anemia: Follows with heme-onc and GI, was treated with IV iron.  Hgb 11.3 on 04/18/2024  PMR: On prednisone   RTC in  6  months   Medication Adjustments/Labs and Tests Ordered: Current medicines are reviewed at length with the patient today.  Concerns regarding medicines are outlined above.  Orders Placed This Encounter  Procedures   EKG 12-Lead   ECHOCARDIOGRAM COMPLETE   No orders of the defined types were placed in this encounter.   Patient Instructions  Medication Instructions:  Continue current medication *If you need a refill on your cardiac medications before your next appointment, please call your pharmacy*  Lab Work: none If you have labs (blood work) drawn today and your tests are completely normal, you will receive your results only by: MyChart Message (if you have MyChart) OR A paper copy in the mail If you have any lab test that is abnormal or we need to change your treatment, we will call you to review the results.  Testing/Procedures: Echo please schedule for sept 2025  Your physician has requested that you have an echocardiogram. Echocardiography is a painless test that uses sound waves to create images of your heart. It provides your doctor with information about the size and shape of your heart and how well your heart's chambers and valves are working. This procedure takes approximately one hour. There are no restrictions for this procedure. Please do NOT wear cologne, perfume, aftershave, or lotions (deodorant is allowed). Please arrive 15 minutes prior to your appointment time.  Please note: We ask at that you not bring children with you during ultrasound (echo/ vascular) testing.  Due to room size and safety concerns, children are not allowed in the ultrasound rooms during exams. Our front office staff cannot provide observation of children in our lobby area while testing is being conducted. An adult accompanying a patient to their appointment will only be allowed in the ultrasound room at the discretion of the ultrasound technician under special circumstances. We apologize for any  inconvenience.   Follow-Up: At [facility], you and your health needs are our priority.  As part of our continuing mission to provide you with exceptional heart care, our providers are all part of one team.  This team includes your primary Cardiologist (physician) and Advanced Practice Providers or APPs (Physician Assistants and Nurse Practitioners) who all work together to provide you with the care you need, when you need it.  Your next appointment:   6 month(s)  Provider:   Lonni LITTIE Nanas, Alexander    We recommend signing up for the patient portal called MyChart.  Sign up information is provided on this After Visit Summary.  MyChart is used to connect with patients for Virtual Visits (Telemedicine).  Patients are able to view lab/test results, encounter notes, upcoming appointments, etc.  Non-urgent messages can be sent to your provider as well.   To learn more about what you can do with MyChart, go to [email or web address].   Other Instructions   none   Signed, [name]  05/21/2024 2:02 PM    [facility]

## 2024-05-21 ENCOUNTER — Ambulatory Visit: Payer: Medicare Other | Attending: Cardiology | Admitting: Cardiology

## 2024-05-21 ENCOUNTER — Encounter: Payer: Self-pay | Admitting: Cardiology

## 2024-05-21 VITALS — BP 115/77 | HR 91 | Resp 16 | Ht 72.0 in | Wt 173.2 lb

## 2024-05-21 DIAGNOSIS — I471 Supraventricular tachycardia, unspecified: Secondary | ICD-10-CM | POA: Diagnosis not present

## 2024-05-21 DIAGNOSIS — I350 Nonrheumatic aortic (valve) stenosis: Secondary | ICD-10-CM | POA: Insufficient documentation

## 2024-05-21 DIAGNOSIS — I480 Paroxysmal atrial fibrillation: Secondary | ICD-10-CM | POA: Insufficient documentation

## 2024-05-21 NOTE — Patient Instructions (Signed)
 Medication Instructions:  Continue current medication *If you need a refill on your cardiac medications before your next appointment, please call your pharmacy*  Lab Work: none If you have labs (blood work) drawn today and your tests are completely normal, you will receive your results only by: MyChart Message (if you have MyChart) OR A paper copy in the mail If you have any lab test that is abnormal or we need to change your treatment, we will call you to review the results.  Testing/Procedures: Echo please schedule for sept 2025  Your physician has requested that you have an echocardiogram. Echocardiography is a painless test that uses sound waves to create images of your heart. It provides your doctor with information about the size and shape of your heart and how well your heart's chambers and valves are working. This procedure takes approximately one hour. There are no restrictions for this procedure. Please do NOT wear cologne, perfume, aftershave, or lotions (deodorant is allowed). Please arrive 15 minutes prior to your appointment time.  Please note: We ask at that you not bring children with you during ultrasound (echo/ vascular) testing. Due to room size and safety concerns, children are not allowed in the ultrasound rooms during exams. Our front office staff cannot provide observation of children in our lobby area while testing is being conducted. An adult accompanying a patient to their appointment will only be allowed in the ultrasound room at the discretion of the ultrasound technician under special circumstances. We apologize for any inconvenience.   Follow-Up: At [facility], you and your health needs are our priority.  As part of our continuing mission to provide you with exceptional heart care, our providers are all part of one team.  This team includes your primary Cardiologist (physician) and Advanced Practice Providers or APPs (Physician Assistants and Nurse  Practitioners) who all work together to provide you with the care you need, when you need it.  Your next appointment:   6 month(s)  Provider:   Lonni LITTIE Nanas, MD    We recommend signing up for the patient portal called MyChart.  Sign up information is provided on this After Visit Summary.  MyChart is used to connect with patients for Virtual Visits (Telemedicine).  Patients are able to view lab/test results, encounter notes, upcoming appointments, etc.  Non-urgent messages can be sent to your provider as well.   To learn more about what you can do with MyChart, go to [email or web address].   Other Instructions   none

## 2024-05-22 DIAGNOSIS — R5382 Chronic fatigue, unspecified: Secondary | ICD-10-CM | POA: Diagnosis not present

## 2024-05-22 DIAGNOSIS — M79605 Pain in left leg: Secondary | ICD-10-CM | POA: Diagnosis not present

## 2024-05-22 DIAGNOSIS — R269 Unspecified abnormalities of gait and mobility: Secondary | ICD-10-CM | POA: Diagnosis not present

## 2024-05-23 ENCOUNTER — Other Ambulatory Visit: Payer: Self-pay | Admitting: Internal Medicine

## 2024-05-23 DIAGNOSIS — M67911 Unspecified disorder of synovium and tendon, right shoulder: Secondary | ICD-10-CM | POA: Diagnosis not present

## 2024-05-23 DIAGNOSIS — M25552 Pain in left hip: Secondary | ICD-10-CM | POA: Diagnosis not present

## 2024-05-23 DIAGNOSIS — M533 Sacrococcygeal disorders, not elsewhere classified: Secondary | ICD-10-CM

## 2024-05-23 DIAGNOSIS — M542 Cervicalgia: Secondary | ICD-10-CM | POA: Diagnosis not present

## 2024-05-23 DIAGNOSIS — R1031 Right lower quadrant pain: Secondary | ICD-10-CM

## 2024-05-24 ENCOUNTER — Ambulatory Visit
Admission: RE | Admit: 2024-05-24 | Discharge: 2024-05-24 | Disposition: A | Discharge status: Home / Self Care | Source: Ambulatory Visit | Attending: Internal Medicine | Admitting: Internal Medicine

## 2024-05-24 ENCOUNTER — Ambulatory Visit: Admission: RE | Admit: 2024-05-24 | Source: Ambulatory Visit

## 2024-05-24 ENCOUNTER — Ambulatory Visit
Admission: RE | Admit: 2024-05-24 | Discharge: 2024-05-24 | Disposition: A | Discharge status: Home / Self Care | Source: Ambulatory Visit | Attending: Orthopedic Surgery | Admitting: Orthopedic Surgery

## 2024-05-24 ENCOUNTER — Other Ambulatory Visit

## 2024-05-24 ENCOUNTER — Other Ambulatory Visit: Payer: Self-pay | Admitting: Orthopedic Surgery

## 2024-05-24 DIAGNOSIS — N3289 Other specified disorders of bladder: Secondary | ICD-10-CM | POA: Diagnosis not present

## 2024-05-24 DIAGNOSIS — I7143 Infrarenal abdominal aortic aneurysm, without rupture: Secondary | ICD-10-CM | POA: Diagnosis not present

## 2024-05-24 DIAGNOSIS — M25551 Pain in right hip: Secondary | ICD-10-CM

## 2024-05-24 DIAGNOSIS — M533 Sacrococcygeal disorders, not elsewhere classified: Secondary | ICD-10-CM

## 2024-05-24 DIAGNOSIS — N400 Enlarged prostate without lower urinary tract symptoms: Secondary | ICD-10-CM | POA: Diagnosis not present

## 2024-05-24 DIAGNOSIS — Z960 Presence of urogenital implants: Secondary | ICD-10-CM | POA: Diagnosis not present

## 2024-05-24 DIAGNOSIS — M542 Cervicalgia: Secondary | ICD-10-CM

## 2024-05-24 DIAGNOSIS — G8929 Other chronic pain: Secondary | ICD-10-CM | POA: Diagnosis not present

## 2024-05-24 DIAGNOSIS — K760 Fatty (change of) liver, not elsewhere classified: Secondary | ICD-10-CM | POA: Diagnosis not present

## 2024-05-24 DIAGNOSIS — R1031 Right lower quadrant pain: Secondary | ICD-10-CM

## 2024-05-24 MED ORDER — IOPAMIDOL (ISOVUE-300) INJECTION 61%
100.0000 mL | Freq: Once | INTRAVENOUS | Status: AC | PRN
  Administered 2024-05-24: 100 mL via INTRAVENOUS

## 2024-05-28 ENCOUNTER — Other Ambulatory Visit: Payer: Self-pay | Admitting: Radiology

## 2024-05-28 DIAGNOSIS — R339 Retention of urine, unspecified: Secondary | ICD-10-CM

## 2024-05-28 NOTE — H&P (Incomplete)
 Chief Complaint: Patient was seen in consultation today for benign prostatic hyperplasia with urinary retention.   Referring Physician(s): Bell,Eugene D III  Supervising Physician: Jenna Hacker  Patient Status: [facility] - Out-pt  History of Present Illness: [name] is a 89 y.o. male with a medical history significant for polymyalgia rheumatica on chronic prednisone , scoliosis, diastolic heart failure, paroxysmal atrial fibrillation/flutter, PE/DVT (Eliquis ), interstitial lung disease (home oxygen ) chronic leukocytosis, chronic anxiety/agitation and abdominal aortic aneurysm status post endovascular repair. He is familiar to IR from treatment of a persistent type II endoleak in 2022. He also underwent BM biopsy with aspiration 04/09/24 for MGUS.    He was hospitalized several times last year for issues related to generalized weakness and acute respiratory failure. He was recently hospitalized May 2025 for right groin cellulitis and he was treated with IV antibiotics.    [name] also has a history of BPH with lower urinary tract symptoms and his been followed by Urology for many years. He has been on tamsulosin  with escalating dosages and variable effect of his LUTS. He has mostly struggled with nocturia, sometimes needing to void 3-4 times per night. He underwent a CTA of the abdomen 05/11/23 which showed severe prostatomegaly and a markedly distended bladder. PSA 07/03/23 was 6.85. He has failed multiple voiding trials over this past year. He was referred to our team by his Urologist Dr. Carolee to discuss prostate artery embolization. Given his age and co-morbidities, Mr. Emerick is not considered a good candidate for surgical intervention. He consulted with Dr. Jennefer 08/16/23 to discuss prostate artery embolization but Mr. Myler and his family ultimately decided against this. The patient has been foley catheter dependent since last year and has been experiencing a significant amount of  discomfort in his penis.   Interventional Radiology has been asked to evaluate this patient for an image-guided suprapubic catheter placement.   Past Medical History:  Diagnosis Date   AAA (abdominal aortic aneurysm) (HCC)    Abnormal EKG    Left atrial abnormality   Allergic rhinitis    Allergy    Benign neoplasm of colon 04/14/2010   3 small polyps on colonoscopy by Dr. [name]   Carpal tunnel syndrome    Cataract    Decubitus ulcer of coccygeal region, stage 2 (HCC) 07/06/2023   Degenerative disc disease    Disturbances metabolism of methionine, homocystine, and cystathionine (HCC)    Elevated homocysteine   Elevated prostate specific antigen (PSA)    Elevated PSA 07/06/2023   Hearing loss    Hyperlipidemia    ILD (interstitial lung disease) (HCC) 07/06/2023   Impotence of organic origin    Penile implant   Internal hemorrhoids    Leukocytosis 07/06/2023   Neck pain    Otosclerosis of both ears    Peripheral vascular disease (HCC)    Bilateral femoral bruit   Plantar fasciitis    Polymyalgia rheumatica (HCC)    Polymyalgia rheumatica (HCC) 07/06/2023   Rotator cuff syndrome of left shoulder    Scoliosis    Shoulder pain     Past Surgical History:  Procedure Laterality Date   ABDOMINAL AORTIC ENDOVASCULAR STENT GRAFT N/A 08/16/2017   Procedure: ABDOMINAL AORTIC ENDOVASCULAR STENT GRAFT insertion;  Surgeon: Serene Gaile ORN, MD;  Location: [facility] OR;  Service: Vascular;  Laterality: N/A;   Actinic keratosis removal  06/21/2010   Left shoulder; Dr. Jadine   COLONOSCOPY     COLONOSCOPY W/ POLYPECTOMY     DUPUYTREN CONTRACTURE RELEASE  Right 12/05/2013   Procedure: DUPUYTREN CONTRACTURE RELEASE RIGHT LONG, RING AND SMALL FINGERS;  Surgeon: [name]., MD;  Location: [facility];  Service: Orthopedics;  Laterality: Right;   Implant penile pump  2001   IR ANGIOGRAM PELVIS SELECTIVE OR SUPRASELECTIVE  12/11/2020   IR ANGIOGRAM SELECTIVE EACH ADDITIONAL  VESSEL  12/11/2020   IR EMBO ARTERIAL NOT HEMORR HEMANG INC GUIDE ROADMAPPING  12/11/2020   IR RADIOLOGIST EVAL & MGMT  07/30/2020   IR RADIOLOGIST EVAL & MGMT  08/16/2023   IR US  GUIDE VASC ACCESS RIGHT  12/11/2020   Resection of appendix and tip of rectum  February 2005   STAPEDECTOMY Bilateral 1985, 1988   [employer]    Allergies: Daptomycin , Rosuvastatin , Codeine, Morphine  and codeine, and Omnicef [cefdinir]  Medications: Prior to Admission medications   Medication Sig Start Date End Date Taking? Authorizing Provider  acetaminophen  (TYLENOL ) 325 MG tablet Take 2 tablets (650 mg total) by mouth every 6 (six) hours as needed for mild pain (or Fever >/= 101). 03/23/23   Sebastian Toribio LULLA, MD  acyclovir  (ZOVIRAX ) 400 MG tablet Take 400 mg by mouth 2 (two) times daily. 08/12/23   [provider]  apixaban  (ELIQUIS ) 2.5 MG TABS tablet Take 2.5 mg by mouth 2 (two) times daily. 05/09/23   [provider]  butalbital -acetaminophen -caffeine  (FIORICET ) 50-325-40 MG tablet Take 1 tablet by mouth daily as needed for headache. 03/23/23   Sebastian Toribio LULLA, MD  cholecalciferol (VITAMIN D3) 25 MCG (1000 UNIT) tablet Take 2,000 Units by mouth daily.    [provider]  diclofenac  Sodium (VOLTAREN ) 1 % GEL Apply 2 g topically 4 (four) times daily as needed (shoulder pain). 03/23/23   Sebastian Toribio LULLA, MD  docusate sodium  (COLACE) 100 MG capsule Take 100 mg by mouth daily.    [provider]  feeding supplement (ENSURE ENLIVE / ENSURE PLUS) LIQD Take 237 mLs by mouth 3 (three) times daily between meals. 03/23/23   Sebastian Toribio LULLA, MD  finasteride  (PROSCAR ) 5 MG tablet Take 5 mg by mouth daily. 05/17/23   [provider]  fluorometholone  (FML) 0.1 % ophthalmic suspension Place 1 drop into the right eye at bedtime.     [provider]  folic acid  (FOLVITE ) 1 MG tablet Take 1 mg by mouth daily.    [provider]  lansoprazole (PREVACID) 30 MG  capsule Take 30 mg by mouth 2 (two) times daily.    [provider]  LORazepam  (ATIVAN ) 0.5 MG tablet Take 0.5 mg by mouth daily. 0.25 am, 0.25 pm,  0.5 at night 04/01/23   [provider]  melatonin 5 MG TABS Take 1 tablet (5 mg total) by mouth at bedtime. 03/08/24   Dorsey, John T IV, MD  methotrexate (RHEUMATREX) 2.5 MG tablet Take 12.5 mg by mouth once a week. 02/15/24 08/13/24  [provider]  mirtazapine  (REMERON ) 15 MG tablet Take 15 mg by mouth at bedtime. Pt taking 30 mg    [provider]  Multiple Vitamins-Minerals (MULTIVITAMIN PO) Take 1 tablet by mouth daily.     [provider]  ondansetron  (ZOFRAN ) 4 MG tablet Take 1 tablet (4 mg total) by mouth every 4 (four) hours as needed for nausea or vomiting. 08/11/23   [name] Norleen SAILOR, MD  pantoprazole  (PROTONIX ) 40 MG tablet Take 1 tablet (40 mg total) by mouth daily before breakfast. 04/19/24   Rai, Nydia POUR, MD  polyethylene glycol (MIRALAX  / GLYCOLAX )  17 g packet Take 17 g by mouth 2 (two) times daily.    [provider]  predniSONE  (DELTASONE ) 10 MG tablet Take 10 mg by mouth daily with breakfast. 02/15/24 08/13/24  [provider]  predniSONE  (DELTASONE ) 2.5 MG tablet Take 2.5 mg by mouth daily with breakfast. 02/15/24 08/13/24  [provider]  promethazine  (PHENERGAN ) 12.5 MG tablet Take 1 tablet (12.5 mg total) by mouth daily as needed for nausea or vomiting. 08/11/23   [name] Norleen SAILOR, MD  psyllium (REGULOID) 0.52 g capsule Take 0.52 g by mouth daily.    [provider]  senna-docusate (SENNA-S) 8.6-50 MG tablet Take 1 tablet by mouth daily.    [provider]  tamsulosin  (FLOMAX ) 0.4 MG CAPS capsule Take 0.4 mg by mouth at bedtime. PT TAKES 2 CAPSULES AT HS 06/10/20   [provider]  Tart Cherry 1200 MG CAPS Take 1 tablet by mouth daily.    [provider]  traMADol  (ULTRAM ) 50 MG tablet Take 50 mg by mouth every 4 (four) hours as needed  for moderate pain or severe pain.    [provider]  zolpidem  (AMBIEN ) 5 MG tablet Take 5 mg by mouth at bedtime as needed.    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   Social Drivers of Corporate investment banker Strain: Low Risk  (03/07/2023)   Overall Financial Resource Strain (CARDIA)    Difficulty of Paying Living Expenses: Not hard at all  Food Insecurity: No Food Insecurity (04/15/2024)   Hunger Vital Sign    Worried About Running Out of Food in the Last Year: Never true    Ran Out of Food in the Last Year: Never true  Transportation Needs: No Transportation Needs (04/15/2024)   PRAPARE - Administrator, Civil Service (Medical): No    Lack of Transportation (Non-Medical): No  Physical Activity: Not on file  Stress: Not on file  Social Connections: Socially Integrated (04/15/2024)   Social Connection and Isolation Panel    Frequency of Communication with Friends and Family: More than three times a week     Frequency of Social Gatherings with Friends and Family: More than three times a week    Attends Religious Services: More than 4 times per year  Active Member of Clubs or Organizations: Yes    Attends Banker Meetings: More than 4 times per year    Marital Status: Married    Review of Systems: A 12 point ROS discussed and pertinent positives are indicated in the HPI above.  All other systems are negative.  Review of Systems  Vital Signs: There were no vitals taken for this visit.  Physical Exam  Imaging: CT ABDOMEN PELVIS W CONTRAST Result Date: 05/24/2024 CLINICAL DATA:  Right groin pain for the past 6 months. Right sacral pain. EXAM: CT ABDOMEN AND PELVIS WITH CONTRAST TECHNIQUE: Multidetector CT imaging of the abdomen and pelvis was performed using the standard protocol following bolus administration of intravenous contrast. RADIATION DOSE REDUCTION: This exam was performed according to the departmental dose-optimization program which includes automated exposure control, adjustment of the mA and/or kV according to patient size and/or use of iterative reconstruction technique. CONTRAST:  ISOVUE -300 IOPAMIDOL  (ISOVUE -300) INJECTION 61% COMPARISON:  Right hip CT obtained today. Abdomen and pelvis CTA dated 04/15/2024. FINDINGS: Lower chest: Enlarged heart. Atheromatous calcifications, including the coronary arteries and aorta. Stable small amount of pleural scarring at the posterolateral aspect of the major fissure. Stable bilateral lower lobe fibrosis. Hepatobiliary: Mild diffuse low density of the liver. Stable multiple simple appearing liver cysts, not requiring imaging follow-up. Normal-appearing gallbladder. Pancreas: Unremarkable. No pancreatic ductal dilatation or surrounding inflammatory changes. Spleen: Normal in size without focal abnormality. Adrenals/Urinary Tract: Normal-appearing adrenal glands. Stable simple appearing bilateral renal cysts not needing imaging  follow-up. Stable moderate diffuse left renal atrophy. No ureteral abnormalities visualized. Stable Foley catheter in the urinary bladder with minimal urine in the bladder. There is moderate diffuse bladder wall thickening with mild mucosal enhancement. Stomach/Bowel: Surgically absent appendix. Unremarkable stomach, small bowel and colon with the exception of stool noted throughout the colon. Vascular/Lymphatic: Stable aorto bi-iliac stent bridging a thrombosed infrarenal abdominal aortic aneurysm measuring 7.0 cm in maximum diameter, previously 7.1 cm in maximum corresponding diameter. Stable high density embolic material within the native aneurysm sac posteriorly and inferiorly. No enlarged lymph nodes. Reproductive: Moderately enlarged prostate gland with the inflated Foley catheter balloon in the prostate gland. Penile prosthesis and reservoir. Other: Small to moderate-sized proximal left inguinal hernia containing fat. Musculoskeletal: Marked lumbar and moderate to marked lower thoracic spine degenerative changes. IMPRESSION: 1. No explanation for the patient's right groin pain other than a partially deflated penile prosthesis reservoir at that location. 2. Moderate diffuse bladder wall thickening with mild mucosal enhancement, consistent with cystitis. 3. Moderately enlarged prostate gland with the inflated Foley catheter balloon in the prostate gland. 4. Stable aorto bi-iliac stent bridging a thrombosed infrarenal abdominal aortic aneurysm measuring 7.0 cm in maximum diameter. 5. Mild hepatic steatosis. 6. Cardiomegaly and calcific coronary artery and aortic atherosclerosis. 7. Stable moderate diffuse left renal atrophy. Aortic Atherosclerosis (ICD10-I70.0). Electronically Signed   By: Elspeth Bathe M.D.   On: 05/24/2024 15:29    Labs:  CBC: Recent Labs    04/15/24 1712 04/16/24 0148 04/17/24 0303 04/18/24 0553  WBC 21.7* 21.6* 20.9* 16.2*  HGB 12.3* 12.1* 12.1* 11.3*  HCT 37.2* 36.5* 35.9*  34.9*  PLT 323 327 254 232    COAGS: No results for input(s): INR, APTT in the last 8760 hours.  BMP: Recent Labs    04/15/24 1712 04/16/24 0148 04/17/24 0303 04/18/24 0553  NA 137 138 133* 133*  K 4.8 4.1 4.0 4.2  CL 101 102 99 100  CO2 21* 25 27  27  GLUCOSE 147* 84 135* 131*  BUN 43* 38* 33* 32*  CALCIUM  9.6 9.0 8.1* 8.0*  CREATININE 1.02 1.16 1.31* 1.16  GFRNONAA >60 59* 51* 59*    LIVER FUNCTION TESTS: Recent Labs    12/06/23 1453 12/27/23 1607 02/29/24 1007 04/15/24 1712  BILITOT 0.4 0.3 0.4 0.4  AST 17 18 19 23   ALT 24 23 27 26   ALKPHOS 89 81 77 93  PROT 7.2 6.9 6.9 6.5  ALBUMIN  3.8 3.8 3.7 3.5    TUMOR MARKERS: No results for input(s): AFPTM, CEA, CA199, CHROMGRNA in the last 8760 hours.  Assessment and Plan:  Benign prostatic hyperplasia with urinary retention: Aliene CANDIE Scurry, 89 year old male, presents today to the [facility] Interventional Radiology department for an image-guided supra pubic catheter placement.   Risks and benefits discussed with the patient including bleeding, infection, damage to adjacent structures, bowel perforation/fistula connection, and sepsis.  All of the patient's questions were answered, patient is agreeable to proceed. He has been NPO. He is a full code. His last dose of Eliquis  was ***  Consent signed and in chart.  Thank you for this interesting consult.  I greatly enjoyed meeting HALIM SURRETTE and look forward to participating in their care.  A copy of this report was sent to the requesting provider on this date.  Electronically Signed: Warren Dais, AGACNP-BC 05/28/2024, 9:48 AM   I spent a total of  30 Minutes   in face to face in clinical consultation, greater than 50% of which was counseling/coordinating care for BPH with urinary retention.

## 2024-05-29 ENCOUNTER — Ambulatory Visit (HOSPITAL_COMMUNITY)
Admission: RE | Admit: 2024-05-29 | Discharge: 2024-05-29 | Disposition: A | Discharge status: Home / Self Care | Source: Ambulatory Visit | Attending: Urology | Admitting: Urology

## 2024-05-29 ENCOUNTER — Encounter (HOSPITAL_COMMUNITY): Payer: Self-pay

## 2024-05-29 ENCOUNTER — Other Ambulatory Visit: Payer: Self-pay

## 2024-05-29 ENCOUNTER — Telehealth: Payer: Self-pay | Admitting: Physician Assistant

## 2024-05-29 DIAGNOSIS — N401 Enlarged prostate with lower urinary tract symptoms: Secondary | ICD-10-CM | POA: Diagnosis not present

## 2024-05-29 DIAGNOSIS — N138 Other obstructive and reflux uropathy: Secondary | ICD-10-CM | POA: Insufficient documentation

## 2024-05-29 DIAGNOSIS — R339 Retention of urine, unspecified: Secondary | ICD-10-CM | POA: Insufficient documentation

## 2024-05-29 DIAGNOSIS — Z539 Procedure and treatment not carried out, unspecified reason: Secondary | ICD-10-CM | POA: Diagnosis not present

## 2024-05-29 LAB — CBC
HCT: 34.9 % — ABNORMAL LOW (ref 39.0–52.0)
Hemoglobin: 11.5 g/dL — ABNORMAL LOW (ref 13.0–17.0)
MCH: 34.1 pg — ABNORMAL HIGH (ref 26.0–34.0)
MCHC: 33 g/dL (ref 30.0–36.0)
MCV: 103.6 fL — ABNORMAL HIGH (ref 80.0–100.0)
Platelets: 348 10*3/uL (ref 150–400)
RBC: 3.37 MIL/uL — ABNORMAL LOW (ref 4.22–5.81)
RDW: 16.8 % — ABNORMAL HIGH (ref 11.5–15.5)
WBC: 14.8 10*3/uL — ABNORMAL HIGH (ref 4.0–10.5)
nRBC: 0 % (ref 0.0–0.2)

## 2024-05-29 LAB — PROTIME-INR
INR: 1.1 (ref 0.8–1.2)
Prothrombin Time: 14.9 s (ref 11.4–15.2)

## 2024-05-29 NOTE — Telephone Encounter (Signed)
  Patient was here at [facility] today accompanied by his wife for placement of a suprapubic catheter.  Unfortunately he had not been instructed to stop his Eliquis .  I explained to the patient and his wife why anticoagulation needs to be held for the procedure.  His wife was understandably upset by him having to be rescheduled and was upset that they were not told to hold the anticoagulation.  I explained to her that I would do my best to figure out the breakdown in communication and make sure it does not happen in the future.  I discussed the issue with Oneil Bellows, RN and he is in communications with others and schedulers to try and determine where the breakdown occurred.  Mr. Crain will be contacted soon with a new date and he and his wife understand that his Eliquis  must be held 48 hours prior to the procedure and held the day of the procedure.  I also explained if the procedure went well and there was no post op bleeding issues, the Eliquis  can  likely be resumed the day after the procedure.  Ardon Franklin S Khizar Fiorella PA-C 05/29/2024 9:32 AM

## 2024-05-30 DIAGNOSIS — R269 Unspecified abnormalities of gait and mobility: Secondary | ICD-10-CM | POA: Diagnosis not present

## 2024-05-30 DIAGNOSIS — R5382 Chronic fatigue, unspecified: Secondary | ICD-10-CM | POA: Diagnosis not present

## 2024-05-30 DIAGNOSIS — M79605 Pain in left leg: Secondary | ICD-10-CM | POA: Diagnosis not present

## 2024-06-05 ENCOUNTER — Telehealth: Payer: Self-pay | Admitting: Adult Health

## 2024-06-05 DIAGNOSIS — R269 Unspecified abnormalities of gait and mobility: Secondary | ICD-10-CM | POA: Diagnosis not present

## 2024-06-05 DIAGNOSIS — M79605 Pain in left leg: Secondary | ICD-10-CM | POA: Diagnosis not present

## 2024-06-05 DIAGNOSIS — R5382 Chronic fatigue, unspecified: Secondary | ICD-10-CM | POA: Diagnosis not present

## 2024-06-05 NOTE — Telephone Encounter (Signed)
 Wife reporting patient is very anxious at bedtime and not able to relax. She forgot to give lorazepam  and 1/2 Remeron  at 3:30 on Monday and at the next time gave 2 Ativan , as well as the Remeron . Since then he has had increasing anxiety at bedtime. I asked if it had only been since Monday and she reported he had been working up to this. She says she didn't know if this was the natural course of things with his health or not. She also reports he has no energy. He is not verbalizing any SI but does report he wishes he wasn't here.    Lorazepam  0.5mg  - 3:30, 6:30 and bedtime Ambien  5mg  at hs Remeron  15mg  - 2 at bedtime

## 2024-06-05 NOTE — Telephone Encounter (Signed)
 [name] called and spoke with wife.

## 2024-06-05 NOTE — Telephone Encounter (Signed)
 Pt's wife called at 10:03a.  She is on DPR.  She states pt is more anxious.  She's not sure if meds need to be increased or switched.    No upcoming appt scheduled.

## 2024-06-05 NOTE — Telephone Encounter (Signed)
 Pt's wife LVM @ 2:56p stating she was returning phone call to Jay.

## 2024-06-06 ENCOUNTER — Inpatient Hospital Stay: Payer: Medicare Other | Attending: Physician Assistant

## 2024-06-06 ENCOUNTER — Inpatient Hospital Stay (HOSPITAL_BASED_OUTPATIENT_CLINIC_OR_DEPARTMENT_OTHER): Payer: Medicare Other | Admitting: Hematology and Oncology

## 2024-06-06 ENCOUNTER — Other Ambulatory Visit: Payer: Self-pay | Admitting: Hematology and Oncology

## 2024-06-06 VITALS — BP 102/70 | HR 88 | Temp 98.7°F | Resp 17 | Ht 72.0 in | Wt 180.0 lb

## 2024-06-06 DIAGNOSIS — D472 Monoclonal gammopathy: Secondary | ICD-10-CM

## 2024-06-06 DIAGNOSIS — D649 Anemia, unspecified: Secondary | ICD-10-CM

## 2024-06-06 DIAGNOSIS — M353 Polymyalgia rheumatica: Secondary | ICD-10-CM

## 2024-06-06 DIAGNOSIS — D509 Iron deficiency anemia, unspecified: Secondary | ICD-10-CM

## 2024-06-06 DIAGNOSIS — D72829 Elevated white blood cell count, unspecified: Secondary | ICD-10-CM | POA: Insufficient documentation

## 2024-06-06 DIAGNOSIS — Z806 Family history of leukemia: Secondary | ICD-10-CM | POA: Insufficient documentation

## 2024-06-06 LAB — CBC WITH DIFFERENTIAL (CANCER CENTER ONLY)
Abs Immature Granulocytes: 0.13 K/uL — ABNORMAL HIGH (ref 0.00–0.07)
Basophils Absolute: 0.1 K/uL (ref 0.0–0.1)
Basophils Relative: 1 %
Eosinophils Absolute: 0.2 K/uL (ref 0.0–0.5)
Eosinophils Relative: 1 %
HCT: 33 % — ABNORMAL LOW (ref 39.0–52.0)
Hemoglobin: 11 g/dL — ABNORMAL LOW (ref 13.0–17.0)
Immature Granulocytes: 1 %
Lymphocytes Relative: 14 %
Lymphs Abs: 1.9 K/uL (ref 0.7–4.0)
MCH: 34.2 pg — ABNORMAL HIGH (ref 26.0–34.0)
MCHC: 33.3 g/dL (ref 30.0–36.0)
MCV: 102.5 fL — ABNORMAL HIGH (ref 80.0–100.0)
Monocytes Absolute: 1 K/uL (ref 0.1–1.0)
Monocytes Relative: 7 %
Neutro Abs: 10 K/uL — ABNORMAL HIGH (ref 1.7–7.7)
Neutrophils Relative %: 76 %
Platelet Count: 258 K/uL (ref 150–400)
RBC: 3.22 MIL/uL — ABNORMAL LOW (ref 4.22–5.81)
RDW: 17.2 % — ABNORMAL HIGH (ref 11.5–15.5)
WBC Count: 13.2 K/uL — ABNORMAL HIGH (ref 4.0–10.5)
nRBC: 0 % (ref 0.0–0.2)

## 2024-06-06 LAB — CMP (CANCER CENTER ONLY)
ALT: 30 U/L (ref 0–44)
AST: 23 U/L (ref 15–41)
Albumin: 3.6 g/dL (ref 3.5–5.0)
Alkaline Phosphatase: 73 U/L (ref 38–126)
Anion gap: 7 (ref 5–15)
BUN: 44 mg/dL — ABNORMAL HIGH (ref 8–23)
CO2: 32 mmol/L (ref 22–32)
Calcium: 9.7 mg/dL (ref 8.9–10.3)
Chloride: 102 mmol/L (ref 98–111)
Creatinine: 1.29 mg/dL — ABNORMAL HIGH (ref 0.61–1.24)
GFR, Estimated: 52 mL/min — ABNORMAL LOW (ref >60)
Glucose, Bld: 102 mg/dL — ABNORMAL HIGH (ref 70–99)
Potassium: 4.4 mmol/L (ref 3.5–5.1)
Sodium: 141 mmol/L (ref 135–145)
Total Bilirubin: 0.3 mg/dL (ref 0.0–1.2)
Total Protein: 6.9 g/dL (ref 6.5–8.1)

## 2024-06-06 LAB — IRON AND IRON BINDING CAPACITY (CC-WL,HP ONLY)
Iron: 37 ug/dL — ABNORMAL LOW (ref 45–182)
Saturation Ratios: 12 % — ABNORMAL LOW (ref 17.9–39.5)
TIBC: 318 ug/dL (ref 250–450)
UIBC: 281 ug/dL (ref 117–376)

## 2024-06-06 LAB — FERRITIN: Ferritin: 82 ng/mL (ref 24–336)

## 2024-06-06 LAB — LACTATE DEHYDROGENASE: LDH: 151 U/L (ref 98–192)

## 2024-06-06 NOTE — Progress Notes (Signed)
 [facility] [phone]   [phone]  PROGRESS NOTE  Patient Care Team: Shayne Anes, MD as PCP - General (Internal Medicine) Kate Lonni CROME, MD as PCP - Cardiology (Cardiology) Caresse Cough, MD as Referring Physician (Ophthalmology) Gerlean Lynwood BIRCH, MD (Inactive) as Consulting Physician (Vascular Surgery) [name] Norleen SAILOR, MD as Consulting Physician (Gastroenterology)  Hematological/Oncological History # Leukocytosis/Thrombocytosis # Normocytic Anemia  # MGUS  06/14/2023: WBC 14.7, Hgb 8.1, MCV 82.7, Plt 584 06/27/2023 : received 2 units of PRBC  07/03/2023 : WBC 20.5, ANC 17.5, Hgb 9.9, MCV 82.6, Plt 566K, sed rate of 114  07/05/2023: establish care with Dr. Federico  07/21/2023: received 1 dose of IV monoferric  1000 mg  08/31/2023: WBC 17, Hgb 11.4, MCV 89.7, Plt 357  Interval History:  [name] 89 y.o. male with medical history significant for leukocytosis, thrombocytosis, normocytic anemia who presents for a follow up visit. The patient's last visit was on 12/06/2023. In the interim since the last visit he has had no major changes in his health.   On exam today [name] is accompanied by his wife and caregiver today.  He reports that he has been having a lot of weakness lately as well as increased anxiety.  He is not having any lightheadedness, dizziness, shortness of breath.  He reports this has been worsening over the last 3 weeks.  He reports that he is able to walk down his driveway and back is about 300 yards long.  He reports he is eating well and his weight is up to 180 pounds, up from 173 back in June.  He reports that he gets tired with exertion however feels like he is not himself lately.  He notes he has had no overt bleeding, bruising, or dark stools but does have a rare nosebleed.  He reports that he has been having some issues with constipation but otherwise denies any nausea, vomiting, or diarrhea.  Full 10 point ROS is  otherwise negative.  MEDICAL HISTORY:  Past Medical History:  Diagnosis Date   AAA (abdominal aortic aneurysm) (HCC)    Abnormal EKG    Left atrial abnormality   Allergic rhinitis    Allergy    Benign neoplasm of colon 04/14/2010   3 small polyps on colonoscopy by Dr. [name]   Carpal tunnel syndrome    Cataract    Decubitus ulcer of coccygeal region, stage 2 (HCC) 07/06/2023   Degenerative disc disease    Disturbances metabolism of methionine, homocystine, and cystathionine (HCC)    Elevated homocysteine   Elevated prostate specific antigen (PSA)    Elevated PSA 07/06/2023   Hearing loss    Hyperlipidemia    ILD (interstitial lung disease) (HCC) 07/06/2023   Impotence of organic origin    Penile implant   Internal hemorrhoids    Leukocytosis 07/06/2023   Neck pain    Otosclerosis of both ears    Peripheral vascular disease (HCC)    Bilateral femoral bruit   Plantar fasciitis    Polymyalgia rheumatica (HCC)    Polymyalgia rheumatica (HCC) 07/06/2023   Rotator cuff syndrome of left shoulder    Scoliosis    Shoulder pain     SURGICAL HISTORY: Past Surgical History:  Procedure Laterality Date   ABDOMINAL AORTIC ENDOVASCULAR STENT GRAFT N/A 08/16/2017   Procedure: ABDOMINAL AORTIC ENDOVASCULAR STENT GRAFT insertion;  Surgeon: Serene Gaile ORN, MD;  Location: [facility] OR;  Service: Vascular;  Laterality: N/A;   Actinic keratosis removal  06/21/2010  Left shoulder; Dr. Jadine   COLONOSCOPY     COLONOSCOPY W/ POLYPECTOMY     DUPUYTREN CONTRACTURE RELEASE Right 12/05/2013   Procedure: DUPUYTREN CONTRACTURE RELEASE RIGHT LONG, RING AND SMALL FINGERS;  Surgeon: [name]., MD;  Location: [facility];  Service: Orthopedics;  Laterality: Right;   Implant penile pump  2001   IR ANGIOGRAM PELVIS SELECTIVE OR SUPRASELECTIVE  12/11/2020   IR ANGIOGRAM SELECTIVE EACH ADDITIONAL VESSEL  12/11/2020   IR CYSTOSTOMY TUBE PLACEMENT/BLADDER ASPIRATION  06/11/2024   IR EMBO  ARTERIAL NOT HEMORR HEMANG INC GUIDE ROADMAPPING  12/11/2020   IR RADIOLOGIST EVAL & MGMT  07/30/2020   IR RADIOLOGIST EVAL & MGMT  08/16/2023   IR US  GUIDE VASC ACCESS RIGHT  12/11/2020   Resection of appendix and tip of rectum  February 2005   STAPEDECTOMY Bilateral 1985, 1988   [employer]    SOCIAL HISTORY: Social History   Socioeconomic History   Marital status: Married    Spouse name: Saliou Barnier   Number of children: 2   Years of education: Not on file   Highest education level: Not on file  Occupational History   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   Social Drivers of Corporate investment banker Strain: Low Risk  (03/07/2023)   Overall Financial Resource Strain (CARDIA)    Difficulty of Paying Living Expenses: Not hard at all  Food Insecurity: No Food Insecurity (04/15/2024)   Hunger Vital Sign    Worried About Running Out of Food in the Last Year: Never true    Ran Out of Food in the Last Year: Never true  Transportation Needs: No Transportation Needs (04/15/2024)   PRAPARE - Administrator, Civil Service (Medical): No    Lack of Transportation (Non-Medical): No  Physical Activity: Not on file  Stress: Not on file  Social Connections: Socially Integrated (04/15/2024)   Social Connection and Isolation Panel    Frequency of Communication with Friends and Family: More than three times a week    Frequency of Social Gatherings with Friends and Family: More than three times a week    Attends Religious  Services: More than 4 times per year    Active Member of [employer] or Organizations: Yes    Attends Engineer, structural: More than 4 times per year    Marital Status: Married  Catering manager Violence: Not At Risk (04/15/2024)   Humiliation, Afraid, Rape, and Kick questionnaire    Fear of Current or Ex-Partner: No    Emotionally Abused: No    Physically Abused: No    Sexually Abused: No    FAMILY HISTORY: Family History  Problem Relation Age of Onset   Heart disease Mother    Emphysema Mother    Leukemia Brother        Chronic lymphocytic leukemia   Colon cancer Neg Hx    Esophageal cancer Neg Hx    Rectal cancer  Neg Hx    Stomach cancer Neg Hx     ALLERGIES:  is allergic to daptomycin , rosuvastatin , codeine, morphine  and codeine, and omnicef [cefdinir].  MEDICATIONS:  Current Outpatient Medications  Medication Sig Dispense Refill   acetaminophen  (TYLENOL ) 325 MG tablet Take 2 tablets (650 mg total) by mouth every 6 (six) hours as needed for mild pain (or Fever >/= 101).     acyclovir  (ZOVIRAX ) 400 MG tablet Take 400 mg by mouth 2 (two) times daily.     apixaban  (ELIQUIS ) 2.5 MG TABS tablet Take 2.5 mg by mouth 2 (two) times daily.     butalbital -acetaminophen -caffeine  (FIORICET ) 50-325-40 MG tablet Take 1 tablet by mouth daily as needed for headache. 14 tablet 0   cholecalciferol (VITAMIN D3) 25 MCG (1000 UNIT) tablet Take 2,000 Units by mouth daily.     diclofenac  Sodium (VOLTAREN ) 1 % GEL Apply 2 g topically 4 (four) times daily as needed (shoulder pain). 2 g 0   docusate sodium  (COLACE) 100 MG capsule Take 100 mg by mouth daily.     feeding supplement (ENSURE ENLIVE / ENSURE PLUS) LIQD Take 237 mLs by mouth 3 (three) times daily between meals. 237 mL 12   finasteride  (PROSCAR ) 5 MG tablet Take 5 mg by mouth daily.     fluorometholone  (FML) 0.1 % ophthalmic suspension Place 1 drop into the right eye at bedtime.      folic acid  (FOLVITE ) 1 MG tablet Take 1 mg by mouth  daily.     lansoprazole (PREVACID) 30 MG capsule Take 30 mg by mouth 2 (two) times daily.     LORazepam  (ATIVAN ) 0.5 MG tablet Take 0.5 mg by mouth daily. 0.25 am, 0.25 pm,  0.5 at night     melatonin 5 MG TABS Take 1 tablet (5 mg total) by mouth at bedtime.     methotrexate (RHEUMATREX) 2.5 MG tablet Take 12.5 mg by mouth once a week.     mirtazapine  (REMERON ) 15 MG tablet Take 15 mg by mouth at bedtime. Pt taking 30 mg     Multiple Vitamins-Minerals (MULTIVITAMIN PO) Take 1 tablet by mouth daily.      ondansetron  (ZOFRAN ) 4 MG tablet Take 1 tablet (4 mg total) by mouth every 4 (four) hours as needed for nausea or vomiting. 100 tablet 6   pantoprazole  (PROTONIX ) 40 MG tablet Take 1 tablet (40 mg total) by mouth daily before breakfast. 30 tablet 3   polyethylene glycol (MIRALAX  / GLYCOLAX ) 17 g packet Take 17 g by mouth 2 (two) times daily.     predniSONE  (DELTASONE ) 10 MG tablet Take 10 mg by mouth daily with breakfast.     predniSONE  (DELTASONE ) 2.5 MG tablet Take 2.5 mg by mouth daily with breakfast.     promethazine  (PHENERGAN ) 12.5 MG tablet Take 1 tablet (12.5 mg total) by mouth daily as needed for nausea or vomiting. 30 tablet 6   psyllium (REGULOID) 0.52 g capsule Take 0.52 g by mouth daily.     senna-docusate (SENNA-S) 8.6-50 MG tablet Take 1 tablet by mouth daily.     tamsulosin  (FLOMAX ) 0.4 MG CAPS capsule Take 0.4 mg by mouth at bedtime. PT TAKES 2 CAPSULES AT HS     Tart Cherry 1200 MG CAPS Take 1 tablet by mouth daily.     traMADol  (ULTRAM ) 50 MG tablet Take 50 mg by mouth every 4 (four) hours as needed for moderate pain or severe pain.     zolpidem  (AMBIEN ) 5 MG tablet Take  5 mg by mouth at bedtime as needed.     No current facility-administered medications for this visit.   Facility-Administered Medications Ordered in Other Visits  Medication Dose Route Frequency Provider Last Rate Last Admin   0.9 %  sodium chloride  infusion   Intravenous Continuous Covington, Jamie R, NP         REVIEW OF SYSTEMS:   Constitutional: ( - ) fevers, ( - )  chills , ( - ) night sweats Eyes: ( - ) blurriness of vision, ( - ) double vision, ( - ) watery eyes Ears, nose, mouth, throat, and face: ( - ) mucositis, ( - ) sore throat Respiratory: ( - ) cough, ( - ) dyspnea, ( - ) wheezes Cardiovascular: ( - ) palpitation, ( - ) chest discomfort, ( - ) lower extremity swelling Gastrointestinal:  ( - ) nausea, ( - ) heartburn, ( - ) change in bowel habits Skin: ( - ) abnormal skin rashes Lymphatics: ( - ) new lymphadenopathy, ( - ) easy bruising Neurological: ( - ) numbness, ( - ) tingling, ( - ) new weaknesses Behavioral/Psych: ( - ) mood change, ( - ) new changes  All other systems were reviewed with the patient and are negative.  PHYSICAL EXAMINATION:  Vitals:   06/06/24 1534  BP: 102/70  Pulse: 88  Resp: 17  Temp: 98.7 F (37.1 C)  SpO2: 96%      Filed Weights   06/06/24 1534  Weight: 180 lb (81.6 kg)       GENERAL: well appearing elderly Caucasian male, alert, no distress and comfortable SKIN: skin color, texture, turgor are normal, no rashes or significant lesions EYES: conjunctiva are pink and non-injected, sclera clear LUNGS: clear to auscultation and percussion with normal breathing effort HEART: regular rate & rhythm and no murmurs and no lower extremity edema Musculoskeletal: no cyanosis of digits and no clubbing  PSYCH: alert & oriented x 3, fluent speech NEURO: no focal motor/sensory deficits  LABORATORY DATA:  I have reviewed the data as listed    Latest Ref Rng & Units 06/11/2024    8:53 AM 06/06/2024    2:28 PM 05/29/2024    9:00 AM  CBC  WBC 4.0 - 10.5 K/uL 14.6  13.2  14.8   Hemoglobin 13.0 - 17.0 g/dL 88.6  88.9  88.4   Hematocrit 39.0 - 52.0 % 34.9  33.0  34.9   Platelets 150 - 400 K/uL 338  258  348        Latest Ref Rng & Units 06/11/2024    8:53 AM 06/06/2024    2:28 PM 04/18/2024    5:53 AM  CMP  Glucose 70 - 99 mg/dL 97  897  868    BUN 8 - 23 mg/dL 31  44  32   Creatinine 0.61 - 1.24 mg/dL 8.75  8.70  8.83   Sodium 135 - 145 mmol/L 140  141  133   Potassium 3.5 - 5.1 mmol/L 4.0  4.4  4.2   Chloride 98 - 111 mmol/L 103  102  100   CO2 22 - 32 mmol/L 28  32  27   Calcium  8.9 - 10.3 mg/dL 9.1  9.7  8.0   Total Protein 6.5 - 8.1 g/dL  6.9    Total Bilirubin 0.0 - 1.2 mg/dL  0.3    Alkaline Phos 38 - 126 U/L  73    AST 15 - 41 U/L  23  ALT 0 - 44 U/L  30      Lab Results  Component Value Date   MPROTEIN 0.4 (H) 06/06/2024   MPROTEIN 0.4 (H) 02/29/2024   MPROTEIN 0.3 (H) 07/05/2023   Lab Results  Component Value Date   KPAFRELGTCHN 65.7 (H) 06/06/2024   KPAFRELGTCHN 59.2 (H) 02/29/2024   KPAFRELGTCHN 84.4 (H) 07/05/2023   LAMBDASER 20.4 06/06/2024   LAMBDASER 16.5 02/29/2024   LAMBDASER 24.2 07/05/2023   KAPLAMBRATIO 3.22 (H) 06/06/2024   KAPLAMBRATIO 3.59 (H) 02/29/2024   KAPLAMBRATIO 3.49 (H) 07/05/2023   RADIOGRAPHIC STUDIES: IR CYSTOSTOMY TUBE PLACEMENT/BLADDER ASPIRATION Result Date: 06/11/2024 INDICATION: Urinary retention, BPH, outflow obstruction, Foley dependent EXAM: ULTRASOUND AND FLUOROSCOPIC 16 FRENCH SUPRAPUBIC CATHETER PLACEMENT (COUNCIL TIP CATHETER) COMPARISON:  05/24/2024 MEDICATIONS: 1% LIDOCAINE  LOCAL ANESTHESIA/SEDATION: Moderate (conscious) sedation was employed during this procedure. A total of Versed  1.5 mg and Fentanyl  75 mcg was administered intravenously by the radiology nurse. Total intra-service moderate Sedation Time: 7 minutes. The patient's level of consciousness and vital signs were monitored continuously by radiology nursing throughout the procedure under my direct supervision. CONTRAST:  10 cc omni 300-administered into the collecting system(s) FLUOROSCOPY: Radiation Exposure Index (as provided by the fluoroscopic device): 4.0 mGy Kerma COMPLICATIONS: None immediate. PROCEDURE: Informed written consent was obtained from the patient after a thorough discussion of the procedural  risks, benefits and alternatives. All questions were addressed. Maximal Sterile Barrier Technique was utilized including caps, mask, sterile gowns, sterile gloves, sterile drape, hand hygiene and skin antiseptic. A timeout was performed prior to the initiation of the procedure. Previous imaging reviewed. Patient positioned supine. Bladder distended with saline through the Foley catheter. Under sterile conditions and local anesthesia, ultrasound percutaneous needle access performed of the bladder dome. Images obtained for documentation. There was return of clear urine. Amplatz guidewire inserted. Tract dilatation performed with a 8 x 100 Athletis balloon. While deflating the balloon, the 16 French balloon tip Council catheter was advanced through the percutaneous tract into the bladder. Deflated balloon and guidewire removed. Foley balloon inflated with 10 cc saline and retracted against the anterior bladder wall. Contrast injection confirms position. Images obtained for documentation. Following the procedure the Foley catheter was removed. Sterile dressing applied. Gravity drainage bag connected. No immediate complication. Patient tolerated the procedure well. IMPRESSION: Successful ultrasound and fluoroscopic suprapubic catheter placement (16 French balloon tip Council catheter). Electronically Signed   By: CHRISTELLA.  Shick M.D.   On: 06/11/2024 11:02   CT HIP RIGHT WO CONTRAST Result Date: 06/01/2024 CLINICAL DATA:  Chronic right hip pain.  No known injury. EXAM: CT OF THE RIGHT HIP WITHOUT CONTRAST TECHNIQUE: Multidetector CT imaging of the right hip was performed according to the standard protocol. Multiplanar CT image reconstructions were also generated. RADIATION DOSE REDUCTION: This exam was performed according to the departmental dose-optimization program which includes automated exposure control, adjustment of the mA and/or kV according to patient size and/or use of iterative reconstruction technique.  COMPARISON:  Same day pelvic CT.  Pelvic CTA 04/15/2024. FINDINGS: Bones/Joint/Cartilage No evidence of acute fracture, dislocation or femoral head osteonecrosis. Mild right hip degenerative changes without significant joint effusion. There is mild chronic spurring of the greater trochanter and ischium. Ligaments Suboptimally assessed by CT. Muscles and Tendons Chronic atrophy of the right gluteus minimus muscle. The right hip muscles and tendons otherwise appear unremarkable. Soft tissues No periarticular fluid collection, unexpected foreign body or soft tissue emphysema. Collapsed reservoir for penile prosthesis is partially imaged in the low right anterior abdominal  wall, better seen on same day pelvic CT. No acute intrapelvic findings identified. IMPRESSION: 1. No acute osseous findings or explanation for the patient's symptoms. 2. Mild right hip degenerative changes. 3. Chronic atrophy of the right gluteus minimus muscle. Electronically Signed   By: Elsie Perone M.D.   On: 06/01/2024 19:19   CT ABDOMEN PELVIS W CONTRAST Result Date: 05/24/2024 CLINICAL DATA:  Right groin pain for the past 6 months. Right sacral pain. EXAM: CT ABDOMEN AND PELVIS WITH CONTRAST TECHNIQUE: Multidetector CT imaging of the abdomen and pelvis was performed using the standard protocol following bolus administration of intravenous contrast. RADIATION DOSE REDUCTION: This exam was performed according to the departmental dose-optimization program which includes automated exposure control, adjustment of the mA and/or kV according to patient size and/or use of iterative reconstruction technique. CONTRAST:  ISOVUE -300 IOPAMIDOL  (ISOVUE -300) INJECTION 61% COMPARISON:  Right hip CT obtained today. Abdomen and pelvis CTA dated 04/15/2024. FINDINGS: Lower chest: Enlarged heart. Atheromatous calcifications, including the coronary arteries and aorta. Stable small amount of pleural scarring at the posterolateral aspect of the major  fissure. Stable bilateral lower lobe fibrosis. Hepatobiliary: Mild diffuse low density of the liver. Stable multiple simple appearing liver cysts, not requiring imaging follow-up. Normal-appearing gallbladder. Pancreas: Unremarkable. No pancreatic ductal dilatation or surrounding inflammatory changes. Spleen: Normal in size without focal abnormality. Adrenals/Urinary Tract: Normal-appearing adrenal glands. Stable simple appearing bilateral renal cysts not needing imaging follow-up. Stable moderate diffuse left renal atrophy. No ureteral abnormalities visualized. Stable Foley catheter in the urinary bladder with minimal urine in the bladder. There is moderate diffuse bladder wall thickening with mild mucosal enhancement. Stomach/Bowel: Surgically absent appendix. Unremarkable stomach, small bowel and colon with the exception of stool noted throughout the colon. Vascular/Lymphatic: Stable aorto bi-iliac stent bridging a thrombosed infrarenal abdominal aortic aneurysm measuring 7.0 cm in maximum diameter, previously 7.1 cm in maximum corresponding diameter. Stable high density embolic material within the native aneurysm sac posteriorly and inferiorly. No enlarged lymph nodes. Reproductive: Moderately enlarged prostate gland with the inflated Foley catheter balloon in the prostate gland. Penile prosthesis and reservoir. Other: Small to moderate-sized proximal left inguinal hernia containing fat. Musculoskeletal: Marked lumbar and moderate to marked lower thoracic spine degenerative changes. IMPRESSION: 1. No explanation for the patient's right groin pain other than a partially deflated penile prosthesis reservoir at that location. 2. Moderate diffuse bladder wall thickening with mild mucosal enhancement, consistent with cystitis. 3. Moderately enlarged prostate gland with the inflated Foley catheter balloon in the prostate gland. 4. Stable aorto bi-iliac stent bridging a thrombosed infrarenal abdominal aortic aneurysm  measuring 7.0 cm in maximum diameter. 5. Mild hepatic steatosis. 6. Cardiomegaly and calcific coronary artery and aortic atherosclerosis. 7. Stable moderate diffuse left renal atrophy. Aortic Atherosclerosis (ICD10-I70.0). Electronically Signed   By: Elspeth Bathe M.D.   On: 05/24/2024 15:29    ASSESSMENT & PLAN [name] 89 y.o. male with medical history significant for leukocytosis, thrombocytosis, normocytic anemia who presents for a follow up visit.  #Normocytic Anemia-Improving # Thrombocytosis-resolved # Leukocytosis- improving -- labs today show white blood cell 14.6, hemoglobin 11.3, MCV 103.6, platelets 338 -- Leukocytosis of neutrophilic predominant remains persistent, likely secondary to underlying inflammatory disorder and steroid use -- Thrombocytosis has resolved with IV iron infusion. -- At this time findings appear most consistent with iron deficiency anemia secondary to underlying inflammatory disorder. --MPN panel shows no evidence of a mutation causing his hematological abnormalities. -- Recommend to return to clinic in 3 months for  labs and 6 months for clinic visit.  Monoclonal Gammopathy of Undetermined Significance --labs showed an IgM kappa monoclonal protein, measured at 0.4 --recommend a metastatic bone survey to assess for lytic lesions --will need for a bone marrow biopsy due to having IgM specificity.  --RTC as above    No orders of the defined types were placed in this encounter.   All questions were answered. The patient knows to call the clinic with any problems, questions or concerns.  A total of more than 30 minutes were spent on this encounter with face-to-face time and non-face-to-face time, including preparing to see the patient, ordering tests and/or medications, counseling the patient and coordination of care as outlined above.   Norleen IVAR Kidney, MD Department of Hematology/Oncology [facility] Phone:  [phone] Pager: [phone] Email: [email or web address]  06/11/2024 8:59 PM

## 2024-06-06 NOTE — Telephone Encounter (Signed)
 Pt's wife LVM @ 9:32a that she missed Gina's call yesteray and was hoping to hear back from her today.

## 2024-06-06 NOTE — Telephone Encounter (Signed)
Did you reach out to her

## 2024-06-07 ENCOUNTER — Telehealth: Payer: Self-pay | Admitting: *Deleted

## 2024-06-07 LAB — KAPPA/LAMBDA LIGHT CHAINS
Kappa free light chain: 65.7 mg/L — ABNORMAL HIGH (ref 3.3–19.4)
Kappa, lambda light chain ratio: 3.22 — ABNORMAL HIGH (ref 0.26–1.65)
Lambda free light chains: 20.4 mg/L (ref 5.7–26.3)

## 2024-06-07 NOTE — Telephone Encounter (Signed)
 Received call from pt's wife regarding his lab results from yesterday and if he was going to need any IV iron. Advised that Dr. Federico is recommending IV iron and has placed the order for this. Advised to expect a call from the [facility] early next week to have this scheduled.  Devere very pleased with this as Signe has been pretty low in his energy levels lately.  No other questions or concerns.

## 2024-06-10 ENCOUNTER — Other Ambulatory Visit: Payer: Self-pay | Admitting: Hematology and Oncology

## 2024-06-10 ENCOUNTER — Other Ambulatory Visit (HOSPITAL_COMMUNITY): Payer: Self-pay | Admitting: Student

## 2024-06-10 ENCOUNTER — Other Ambulatory Visit: Payer: Self-pay | Admitting: Student

## 2024-06-10 ENCOUNTER — Telehealth: Payer: Self-pay

## 2024-06-10 DIAGNOSIS — R5382 Chronic fatigue, unspecified: Secondary | ICD-10-CM | POA: Diagnosis not present

## 2024-06-10 DIAGNOSIS — M79605 Pain in left leg: Secondary | ICD-10-CM | POA: Diagnosis not present

## 2024-06-10 DIAGNOSIS — R269 Unspecified abnormalities of gait and mobility: Secondary | ICD-10-CM | POA: Diagnosis not present

## 2024-06-10 DIAGNOSIS — N138 Other obstructive and reflux uropathy: Secondary | ICD-10-CM

## 2024-06-10 LAB — MULTIPLE MYELOMA PANEL, SERUM
Albumin SerPl Elph-Mcnc: 3.2 g/dL (ref 2.9–4.4)
Albumin/Glob SerPl: 1.1 (ref 0.7–1.7)
Alpha 1: 0.2 g/dL (ref 0.0–0.4)
Alpha2 Glob SerPl Elph-Mcnc: 0.8 g/dL (ref 0.4–1.0)
B-Globulin SerPl Elph-Mcnc: 0.9 g/dL (ref 0.7–1.3)
Gamma Glob SerPl Elph-Mcnc: 1.2 g/dL (ref 0.4–1.8)
Globulin, Total: 3.2 g/dL (ref 2.2–3.9)
IgA: 118 mg/dL (ref 61–437)
IgG (Immunoglobin G), Serum: 733 mg/dL (ref 603–1613)
IgM (Immunoglobulin M), Srm: 817 mg/dL — ABNORMAL HIGH (ref 15–143)
M Protein SerPl Elph-Mcnc: 0.4 g/dL — ABNORMAL HIGH
Total Protein ELP: 6.4 g/dL (ref 6.0–8.5)

## 2024-06-10 NOTE — Telephone Encounter (Addendum)
 Dr. Federico, patient will be scheduled as soon as possible.  Auth Submission: NO AUTH NEEDED Site of care: Site of care: [facility] Payer: Medicare A/B with BCBS supplement Medication & CPT/J Code(s) submitted: Monoferric  (Ferrci derisomaltose) [id number] Diagnosis Code:  Route of submission (phone, fax, portal):  Phone # Fax # Auth type: Buy/Bill PB Units/visits requested: 1000mg  x 1 dose Reference number:  Approval from: 06/10/24 to 10/11/24

## 2024-06-11 ENCOUNTER — Encounter: Payer: Self-pay | Admitting: Hematology and Oncology

## 2024-06-11 ENCOUNTER — Encounter: Payer: Self-pay | Admitting: Physician Assistant

## 2024-06-11 ENCOUNTER — Ambulatory Visit (HOSPITAL_COMMUNITY)
Admission: RE | Admit: 2024-06-11 | Discharge: 2024-06-11 | Disposition: A | Discharge status: Home / Self Care | Source: Ambulatory Visit | Attending: Urology | Admitting: Urology

## 2024-06-11 ENCOUNTER — Encounter

## 2024-06-11 ENCOUNTER — Other Ambulatory Visit: Payer: Self-pay

## 2024-06-11 DIAGNOSIS — N138 Other obstructive and reflux uropathy: Secondary | ICD-10-CM | POA: Insufficient documentation

## 2024-06-11 DIAGNOSIS — N401 Enlarged prostate with lower urinary tract symptoms: Secondary | ICD-10-CM | POA: Insufficient documentation

## 2024-06-11 DIAGNOSIS — J849 Interstitial pulmonary disease, unspecified: Secondary | ICD-10-CM | POA: Insufficient documentation

## 2024-06-11 DIAGNOSIS — E785 Hyperlipidemia, unspecified: Secondary | ICD-10-CM | POA: Diagnosis not present

## 2024-06-11 DIAGNOSIS — R339 Retention of urine, unspecified: Secondary | ICD-10-CM | POA: Diagnosis not present

## 2024-06-11 DIAGNOSIS — Z7901 Long term (current) use of anticoagulants: Secondary | ICD-10-CM | POA: Diagnosis not present

## 2024-06-11 DIAGNOSIS — M353 Polymyalgia rheumatica: Secondary | ICD-10-CM | POA: Insufficient documentation

## 2024-06-11 HISTORY — PX: IR CYSTOSTOMY TUBE PLACEMENT/BLADDER ASPIRATION: IMG1097

## 2024-06-11 LAB — CBC
HCT: 34.9 % — ABNORMAL LOW (ref 39.0–52.0)
Hemoglobin: 11.3 g/dL — ABNORMAL LOW (ref 13.0–17.0)
MCH: 33.5 pg (ref 26.0–34.0)
MCHC: 32.4 g/dL (ref 30.0–36.0)
MCV: 103.6 fL — ABNORMAL HIGH (ref 80.0–100.0)
Platelets: 338 K/uL (ref 150–400)
RBC: 3.37 MIL/uL — ABNORMAL LOW (ref 4.22–5.81)
RDW: 17 % — ABNORMAL HIGH (ref 11.5–15.5)
WBC: 14.6 K/uL — ABNORMAL HIGH (ref 4.0–10.5)
nRBC: 0 % (ref 0.0–0.2)

## 2024-06-11 LAB — BASIC METABOLIC PANEL WITH GFR
Anion gap: 9 (ref 5–15)
BUN: 31 mg/dL — ABNORMAL HIGH (ref 8–23)
CO2: 28 mmol/L (ref 22–32)
Calcium: 9.1 mg/dL (ref 8.9–10.3)
Chloride: 103 mmol/L (ref 98–111)
Creatinine, Ser: 1.24 mg/dL (ref 0.61–1.24)
GFR, Estimated: 55 mL/min — ABNORMAL LOW (ref >60)
Glucose, Bld: 97 mg/dL (ref 70–99)
Potassium: 4 mmol/L (ref 3.5–5.1)
Sodium: 140 mmol/L (ref 135–145)

## 2024-06-11 LAB — PROTIME-INR
INR: 1.1 (ref 0.8–1.2)
Prothrombin Time: 14.4 s (ref 11.4–15.2)

## 2024-06-11 MED ORDER — SODIUM CHLORIDE 0.9 % IV SOLN: INTRAVENOUS | Status: DC

## 2024-06-11 MED ORDER — IOHEXOL 300 MG/ML  SOLN
50.0000 mL | Freq: Once | INTRAMUSCULAR | Status: AC | PRN
  Administered 2024-06-11: 10 mL

## 2024-06-11 MED ORDER — MIDAZOLAM HCL 2 MG/2ML IJ SOLN
INTRAMUSCULAR | Status: AC
  Filled 2024-06-11: qty 2

## 2024-06-11 MED ORDER — LIDOCAINE HCL 1 % IJ SOLN
INTRAMUSCULAR | Status: AC
  Filled 2024-06-11: qty 20

## 2024-06-11 MED ORDER — FENTANYL CITRATE (PF) 100 MCG/2ML IJ SOLN
INTRAMUSCULAR | Status: AC
  Filled 2024-06-11: qty 2

## 2024-06-11 MED ORDER — MIDAZOLAM HCL 2 MG/2ML IJ SOLN
INTRAMUSCULAR | Status: AC | PRN
Start: 2024-06-11 — End: 2024-06-11
  Administered 2024-06-11: .5 mg via INTRAVENOUS
  Administered 2024-06-11: 1 mg via INTRAVENOUS

## 2024-06-11 MED ORDER — FENTANYL CITRATE (PF) 100 MCG/2ML IJ SOLN
INTRAMUSCULAR | Status: AC | PRN
  Administered 2024-06-11: 50 ug via INTRAVENOUS
  Administered 2024-06-11: 25 ug via INTRAVENOUS

## 2024-06-11 NOTE — Procedures (Signed)
 Interventional Radiology Procedure Note  Procedure: US  AND FLUORO GUIDED SP TUBE PLACEMENT    Complications: None  Estimated Blood Loss:  0  Findings: 16 FR COUNCIL TIP FULL REPORT IN PACS    EMERSON FREDERIC SPECKING, MD

## 2024-06-11 NOTE — H&P (Addendum)
 Chief Complaint: Urinary retention, BPH with LUTS - IR consulted for image guided suprapubic catheter placement  Referring Provider(s): Carolee Sherwood JONETTA MOULD, MD   Supervising Physician: Vanice Revel  Patient Status: [facility] - Out-pt  History of Present Illness: [name] is a 89 y.o. male with pmhx BPH with LUTS, urinary retention, HLD, interstitial lung disease, polymyalgia rheumatica. Presenting to IR service for suprapubic catheter placement for chronic urinary retention. Previously presented for suprapubic catheter placement on 05/29/24, but procedure was cancelled as pt was not holding his Eliquis . Patient reports understanding that he is here to have a SP catheter placed and would like to move forward with the procedure, but is unable to recall any other information. States that his wife and caregiver are here for additional questions.   Per wife and RN, patient has been NPO since midnight. His last dose of Eliquis  was on 7/11. RN states that he recently had a UTI and has had worsening penile discomfort with the foley catheter. Given his penile prosthesis, Dr. Carolee is concerned about increased UTIs and recommended placement of an SP catheter. Patient denies any pain in his abdomen, back, difficulty with his urinary catheter, or new complaints at this time. All questions and concerns answered at the bedside.    Patient is Full Code  Past Medical History:  Diagnosis Date   AAA (abdominal aortic aneurysm) (HCC)    Abnormal EKG    Left atrial abnormality   Allergic rhinitis    Allergy    Benign neoplasm of colon 04/14/2010   3 small polyps on colonoscopy by Dr. [name]   Carpal tunnel syndrome    Cataract    Decubitus ulcer of coccygeal region, stage 2 (HCC) 07/06/2023   Degenerative disc disease    Disturbances metabolism of methionine, homocystine, and cystathionine (HCC)    Elevated homocysteine   Elevated prostate specific antigen (PSA)    Elevated PSA 07/06/2023   Hearing  loss    Hyperlipidemia    ILD (interstitial lung disease) (HCC) 07/06/2023   Impotence of organic origin    Penile implant   Internal hemorrhoids    Leukocytosis 07/06/2023   Neck pain    Otosclerosis of both ears    Peripheral vascular disease (HCC)    Bilateral femoral bruit   Plantar fasciitis    Polymyalgia rheumatica (HCC)    Polymyalgia rheumatica (HCC) 07/06/2023   Rotator cuff syndrome of left shoulder    Scoliosis    Shoulder pain     Past Surgical History:  Procedure Laterality Date   ABDOMINAL AORTIC ENDOVASCULAR STENT GRAFT N/A 08/16/2017   Procedure: ABDOMINAL AORTIC ENDOVASCULAR STENT GRAFT insertion;  Surgeon: Serene Gaile ORN, MD;  Location: [facility] OR;  Service: Vascular;  Laterality: N/A;   Actinic keratosis removal  06/21/2010   Left shoulder; Dr. Jadine   COLONOSCOPY     COLONOSCOPY W/ POLYPECTOMY     DUPUYTREN CONTRACTURE RELEASE Right 12/05/2013   Procedure: DUPUYTREN CONTRACTURE RELEASE RIGHT LONG, RING AND SMALL FINGERS;  Surgeon: [name]., MD;  Location: [facility];  Service: Orthopedics;  Laterality: Right;   Implant penile pump  2001   IR ANGIOGRAM PELVIS SELECTIVE OR SUPRASELECTIVE  12/11/2020   IR ANGIOGRAM SELECTIVE EACH ADDITIONAL VESSEL  12/11/2020   IR EMBO ARTERIAL NOT HEMORR HEMANG INC GUIDE ROADMAPPING  12/11/2020   IR RADIOLOGIST EVAL & MGMT  07/30/2020   IR RADIOLOGIST EVAL & MGMT  08/16/2023   IR US  GUIDE VASC  ACCESS RIGHT  12/11/2020   Resection of appendix and tip of rectum  February 2005   STAPEDECTOMY Bilateral 1985, 1988   [employer]    Allergies: Daptomycin , Rosuvastatin , Codeine, Morphine  and codeine, and Omnicef [cefdinir]  Medications: Prior to Admission medications   Medication Sig Start Date End Date Taking? Authorizing Provider  acetaminophen  (TYLENOL ) 325 MG tablet Take 2 tablets (650 mg total) by mouth every 6 (six) hours as needed for mild pain (or Fever >/= 101). 03/23/23   Sebastian Toribio GAILS, MD   acyclovir  (ZOVIRAX ) 400 MG tablet Take 400 mg by mouth 2 (two) times daily. 08/12/23   [provider]  apixaban  (ELIQUIS ) 2.5 MG TABS tablet Take 2.5 mg by mouth 2 (two) times daily. 05/09/23   [provider]  butalbital -acetaminophen -caffeine  (FIORICET ) 50-325-40 MG tablet Take 1 tablet by mouth daily as needed for headache. 03/23/23   Sebastian Toribio GAILS, MD  cholecalciferol (VITAMIN D3) 25 MCG (1000 UNIT) tablet Take 2,000 Units by mouth daily.    [provider]  diclofenac  Sodium (VOLTAREN ) 1 % GEL Apply 2 g topically 4 (four) times daily as needed (shoulder pain). 03/23/23   Sebastian Toribio GAILS, MD  docusate sodium  (COLACE) 100 MG capsule Take 100 mg by mouth daily.    [provider]  feeding supplement (ENSURE ENLIVE / ENSURE PLUS) LIQD Take 237 mLs by mouth 3 (three) times daily between meals. 03/23/23   Sebastian Toribio GAILS, MD  finasteride  (PROSCAR ) 5 MG tablet Take 5 mg by mouth daily. 05/17/23   [provider]  fluorometholone  (FML) 0.1 % ophthalmic suspension Place 1 drop into the right eye at bedtime.     [provider]  folic acid  (FOLVITE ) 1 MG tablet Take 1 mg by mouth daily.    [provider]  lansoprazole (PREVACID) 30 MG capsule Take 30 mg by mouth 2 (two) times daily.    [provider]  LORazepam  (ATIVAN ) 0.5 MG tablet Take 0.5 mg by mouth daily. 0.25 am, 0.25 pm,  0.5 at night 04/01/23   [provider]  melatonin 5 MG TABS Take 1 tablet (5 mg total) by mouth at bedtime. 03/08/24   Dorsey, John T IV, MD  methotrexate (RHEUMATREX) 2.5 MG tablet Take 12.5 mg by mouth once a week. 02/15/24 08/13/24  [provider]  mirtazapine  (REMERON ) 15 MG tablet Take 15 mg by mouth at bedtime. Pt taking 30 mg    [provider]  Multiple Vitamins-Minerals (MULTIVITAMIN PO) Take 1 tablet by mouth daily.     [provider]  ondansetron  (ZOFRAN ) 4 MG tablet Take 1 tablet (4 mg total) by mouth  every 4 (four) hours as needed for nausea or vomiting. 08/11/23   [name] Norleen SAILOR, MD  pantoprazole  (PROTONIX ) 40 MG tablet Take 1 tablet (40 mg total) by mouth daily before breakfast. 04/19/24   Rai, Ripudeep K, MD  polyethylene glycol (MIRALAX  / GLYCOLAX ) 17 g packet Take 17 g by mouth 2 (two) times daily.    [provider]  predniSONE  (DELTASONE ) 10 MG tablet Take 10 mg by mouth daily with breakfast. 02/15/24 08/13/24  [provider]  predniSONE  (DELTASONE ) 2.5 MG tablet Take 2.5 mg by mouth daily with breakfast. 02/15/24 08/13/24  [provider]  promethazine  (PHENERGAN ) 12.5 MG tablet Take 1 tablet (12.5 mg total) by mouth daily as needed for nausea or vomiting. 08/11/23   [name] Norleen SAILOR, MD  psyllium (REGULOID) 0.52 g capsule Take 0.52 g by mouth  daily.    [provider]  senna-docusate (SENNA-S) 8.6-50 MG tablet Take 1 tablet by mouth daily.    [provider]  tamsulosin  (FLOMAX ) 0.4 MG CAPS capsule Take 0.4 mg by mouth at bedtime. PT TAKES 2 CAPSULES AT HS 06/10/20   [provider]  Tart Cherry 1200 MG CAPS Take 1 tablet by mouth daily.    [provider]  traMADol  (ULTRAM ) 50 MG tablet Take 50 mg by mouth every 4 (four) hours as needed for moderate pain or severe pain.    [provider]  zolpidem  (AMBIEN ) 5 MG tablet Take 5 mg by mouth at bedtime as needed.    [provider]     Family History  Problem Relation Age of Onset   Heart disease Mother    Emphysema Mother    Leukemia Brother        Chronic lymphocytic leukemia   Colon cancer Neg Hx    Esophageal cancer Neg Hx    Rectal cancer Neg Hx    Stomach cancer Neg Hx     Social History   Socioeconomic History   Marital status: Married    Spouse name: Luca Dyar   Number of children: 2   Years of education: Not on file   Highest education level: Not on file  Occupational History   Occupation: Dealer: Information systems manager   Occupation: retired  Tobacco Use   Smoking status: Never   Smokeless tobacco: Never  Vaping Use   Vaping status: Never Used  Substance and Sexual Activity   Alcohol  use: No    Alcohol /week: 0.0 standard drinks of alcohol    Drug use: No   Sexual activity: Not Currently  Other Topics Concern   Not on file  Social History Narrative   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   Social Drivers of Corporate investment banker Strain: Low Risk  (03/07/2023)   Overall Financial Resource Strain (CARDIA)    Difficulty of Paying Living Expenses: Not hard at all  Food Insecurity: No Food Insecurity (04/15/2024)   Hunger Vital Sign    Worried About Running Out of Food in the Last Year: Never true    Ran Out of Food in the Last Year: Never true  Transportation Needs: No Transportation Needs (04/15/2024)   PRAPARE - Administrator, Civil Service (Medical): No    Lack of Transportation (Non-Medical): No  Physical Activity: Not on file  Stress: Not on file  Social Connections: Socially Integrated (04/15/2024)   Social Connection and Isolation Panel    Frequency of Communication with Friends and Family: More than three times a week    Frequency of Social Gatherings with Friends and Family: More than three times a week    Attends Religious Services: More than 4 times per year    Active Member of [employer] or Organizations: Yes    Attends Engineer, structural: More than 4 times per year    Marital Status: Married     Review of Systems: A 12 point ROS discussed and pertinent positives are indicated in the HPI above.  All other systems are negative.  Review of Systems  Constitutional:  Negative for chills and fever.  Gastrointestinal:  Negative for abdominal pain.  Genitourinary:  Negative for decreased urine volume and dysuria.  Musculoskeletal:  Negative for back pain.    Vital Signs: BP 119/65 (BP  Location:  Left Arm)   Pulse 76   Temp 97.6 F (36.4 C) (Oral)   Resp 17   Ht 6' (1.829 m)   Wt 178 lb (80.7 kg)   SpO2 100%   BMI 24.14 kg/m     Physical Exam Vitals reviewed.  Constitutional:      Appearance: Normal appearance.  HENT:     Head: Normocephalic and atraumatic.     Mouth/Throat:     Mouth: Mucous membranes are moist.     Pharynx: Oropharynx is clear.  Cardiovascular:     Rate and Rhythm: Normal rate and regular rhythm.  Pulmonary:     Effort: Pulmonary effort is normal.     Breath sounds: Normal breath sounds.  Abdominal:     General: Abdomen is flat.     Palpations: Abdomen is soft.     Tenderness: There is no abdominal tenderness.  Genitourinary:    Comments: Foley catheter is in place and clamped Musculoskeletal:        General: Normal range of motion.     Cervical back: Normal range of motion.  Skin:    General: Skin is warm and dry.  Neurological:     General: No focal deficit present.     Mental Status: He is alert and oriented to person, place, and time. Mental status is at baseline.  Psychiatric:        Mood and Affect: Mood normal.        Behavior: Behavior normal.        Judgment: Judgment normal.     Imaging: CT HIP RIGHT WO CONTRAST Result Date: 06/01/2024 CLINICAL DATA:  Chronic right hip pain.  No known injury. EXAM: CT OF THE RIGHT HIP WITHOUT CONTRAST TECHNIQUE: Multidetector CT imaging of the right hip was performed according to the standard protocol. Multiplanar CT image reconstructions were also generated. RADIATION DOSE REDUCTION: This exam was performed according to the departmental dose-optimization program which includes automated exposure control, adjustment of the mA and/or kV according to patient size and/or use of iterative reconstruction technique. COMPARISON:  Same day pelvic CT.  Pelvic CTA 04/15/2024. FINDINGS: Bones/Joint/Cartilage No evidence of acute fracture, dislocation or femoral head osteonecrosis. Mild right hip degenerative  changes without significant joint effusion. There is mild chronic spurring of the greater trochanter and ischium. Ligaments Suboptimally assessed by CT. Muscles and Tendons Chronic atrophy of the right gluteus minimus muscle. The right hip muscles and tendons otherwise appear unremarkable. Soft tissues No periarticular fluid collection, unexpected foreign body or soft tissue emphysema. Collapsed reservoir for penile prosthesis is partially imaged in the low right anterior abdominal wall, better seen on same day pelvic CT. No acute intrapelvic findings identified. IMPRESSION: 1. No acute osseous findings or explanation for the patient's symptoms. 2. Mild right hip degenerative changes. 3. Chronic atrophy of the right gluteus minimus muscle. Electronically Signed   By: Elsie Perone M.D.   On: 06/01/2024 19:19   CT ABDOMEN PELVIS W CONTRAST Result Date: 05/24/2024 CLINICAL DATA:  Right groin pain for the past 6 months. Right sacral pain. EXAM: CT ABDOMEN AND PELVIS WITH CONTRAST TECHNIQUE: Multidetector CT imaging of the abdomen and pelvis was performed using the standard protocol following bolus administration of intravenous contrast. RADIATION DOSE REDUCTION: This exam was performed according to the departmental dose-optimization program which includes automated exposure control, adjustment of the mA and/or kV according to patient size and/or use of iterative reconstruction technique. CONTRAST:  100mL ISOVUE -300 IOPAMIDOL  (ISOVUE -300) INJECTION 61% COMPARISON:  Right hip CT obtained today. Abdomen and pelvis CTA dated 04/15/2024. FINDINGS: Lower chest: Enlarged heart. Atheromatous calcifications, including the coronary arteries and aorta. Stable small amount of pleural scarring at the posterolateral aspect of the major fissure. Stable bilateral lower lobe fibrosis. Hepatobiliary: Mild diffuse low density of the liver. Stable multiple simple appearing liver cysts, not requiring imaging follow-up.  Normal-appearing gallbladder. Pancreas: Unremarkable. No pancreatic ductal dilatation or surrounding inflammatory changes. Spleen: Normal in size without focal abnormality. Adrenals/Urinary Tract: Normal-appearing adrenal glands. Stable simple appearing bilateral renal cysts not needing imaging follow-up. Stable moderate diffuse left renal atrophy. No ureteral abnormalities visualized. Stable Foley catheter in the urinary bladder with minimal urine in the bladder. There is moderate diffuse bladder wall thickening with mild mucosal enhancement. Stomach/Bowel: Surgically absent appendix. Unremarkable stomach, small bowel and colon with the exception of stool noted throughout the colon. Vascular/Lymphatic: Stable aorto bi-iliac stent bridging a thrombosed infrarenal abdominal aortic aneurysm measuring 7.0 cm in maximum diameter, previously 7.1 cm in maximum corresponding diameter. Stable high density embolic material within the native aneurysm sac posteriorly and inferiorly. No enlarged lymph nodes. Reproductive: Moderately enlarged prostate gland with the inflated Foley catheter balloon in the prostate gland. Penile prosthesis and reservoir. Other: Small to moderate-sized proximal left inguinal hernia containing fat. Musculoskeletal: Marked lumbar and moderate to marked lower thoracic spine degenerative changes. IMPRESSION: 1. No explanation for the patient's right groin pain other than a partially deflated penile prosthesis reservoir at that location. 2. Moderate diffuse bladder wall thickening with mild mucosal enhancement, consistent with cystitis. 3. Moderately enlarged prostate gland with the inflated Foley catheter balloon in the prostate gland. 4. Stable aorto bi-iliac stent bridging a thrombosed infrarenal abdominal aortic aneurysm measuring 7.0 cm in maximum diameter. 5. Mild hepatic steatosis. 6. Cardiomegaly and calcific coronary artery and aortic atherosclerosis. 7. Stable moderate diffuse left renal  atrophy. Aortic Atherosclerosis (ICD10-I70.0). Electronically Signed   By: Elspeth Bathe M.D.   On: 05/24/2024 15:29    Labs:  CBC: Recent Labs    04/18/24 0553 05/29/24 0900 06/06/24 1428 06/11/24 0853  WBC 16.2* 14.8* 13.2* 14.6*  HGB 11.3* 11.5* 11.0* 11.3*  HCT 34.9* 34.9* 33.0* 34.9*  PLT 232 348 258 338    COAGS: Recent Labs    05/29/24 0900 06/11/24 0853  INR 1.1 1.1    BMP: Recent Labs    04/17/24 0303 04/18/24 0553 06/06/24 1428 06/11/24 0853  NA 133* 133* 141 140  K 4.0 4.2 4.4 4.0  CL 99 100 102 103  CO2 27 27 32 28  GLUCOSE 135* 131* 102* 97  BUN 33* 32* 44* 31*  CALCIUM  8.1* 8.0* 9.7 9.1  CREATININE 1.31* 1.16 1.29* 1.24  GFRNONAA 51* 59* 52* 55*    LIVER FUNCTION TESTS: Recent Labs    12/27/23 1607 02/29/24 1007 04/15/24 1712 06/06/24 1428  BILITOT 0.3 0.4 0.4 0.3  AST 18 19 23 23   ALT 23 27 26 30   ALKPHOS 81 77 93 73  PROT 6.9 6.9 6.5 6.9  ALBUMIN  3.8 3.7 3.5 3.6    TUMOR MARKERS: No results for input(s): AFPTM, CEA, CA199, CHROMGRNA in the last 8760 hours.  Assessment and Plan:  GHALI MORISSETTE is a 89 y.o. male with pmhx BPH with LUTS, urinary retention, HLD, interstitial lung disease, polymyalgia rheumatica. Presenting to IR service for suprapubic catheter placement for chronic urinary retention. Previously presented for suprapubic catheter placement on 05/29/24, but procedure was cancelled as pt was not holding his Eliquis .  Patient's wife and nurse report patient has held all blood thinners today and is NPO. Reports no additional new complaints.  Risks and benefits discussed with the patient including bleeding, infection, damage to adjacent structures, bladder perforation/fistula connection, and sepsis. All of the patient's questions were answered, patient is agreeable to proceed. Consent signed and in chart.   Thank you for allowing our service to participate in [name] 's care.  Electronically  Signed: Glennon CHRISTELLA Bal, PA-C   06/11/2024, 10:09 AM      I spent a total of  40 Minutes  in face to face in clinical consultation, greater than 50% of which was counseling/coordinating care for suprapubic catheter placement.

## 2024-06-12 ENCOUNTER — Inpatient Hospital Stay (HOSPITAL_COMMUNITY)
Admission: RE | Admit: 2024-06-12 | Discharge: 2024-06-12 | Discharge status: Home / Self Care | Source: Ambulatory Visit | Attending: Hematology and Oncology

## 2024-06-12 VITALS — BP 94/73 | HR 112 | Temp 97.7°F | Resp 17

## 2024-06-12 DIAGNOSIS — D509 Iron deficiency anemia, unspecified: Secondary | ICD-10-CM | POA: Diagnosis not present

## 2024-06-12 MED ORDER — SODIUM CHLORIDE 0.9 % IV SOLN
1000.0000 mg | Freq: Once | INTRAVENOUS | Status: AC
  Administered 2024-06-12: 1000 mg via INTRAVENOUS
  Filled 2024-06-12: qty 10

## 2024-06-13 ENCOUNTER — Other Ambulatory Visit: Payer: Self-pay | Admitting: Primary Care

## 2024-06-13 DIAGNOSIS — R600 Localized edema: Secondary | ICD-10-CM

## 2024-06-13 DIAGNOSIS — R338 Other retention of urine: Secondary | ICD-10-CM | POA: Diagnosis not present

## 2024-06-13 DIAGNOSIS — M25551 Pain in right hip: Secondary | ICD-10-CM

## 2024-06-14 ENCOUNTER — Ambulatory Visit
Admission: RE | Admit: 2024-06-14 | Discharge: 2024-06-14 | Disposition: A | Discharge status: Home / Self Care | Source: Ambulatory Visit | Attending: Primary Care | Admitting: Primary Care

## 2024-06-14 ENCOUNTER — Encounter: Payer: Self-pay | Admitting: Orthopedic Surgery

## 2024-06-14 ENCOUNTER — Ambulatory Visit
Admission: RE | Admit: 2024-06-14 | Discharge: 2024-06-14 | Disposition: A | Discharge status: Home / Self Care | Source: Ambulatory Visit | Attending: Orthopedic Surgery | Admitting: Orthopedic Surgery

## 2024-06-14 ENCOUNTER — Encounter: Payer: Self-pay | Admitting: Primary Care

## 2024-06-14 DIAGNOSIS — M25551 Pain in right hip: Secondary | ICD-10-CM

## 2024-06-14 DIAGNOSIS — M542 Cervicalgia: Secondary | ICD-10-CM

## 2024-06-14 DIAGNOSIS — M47812 Spondylosis without myelopathy or radiculopathy, cervical region: Secondary | ICD-10-CM | POA: Diagnosis not present

## 2024-06-14 DIAGNOSIS — I7121 Aneurysm of the ascending aorta, without rupture: Secondary | ICD-10-CM | POA: Diagnosis not present

## 2024-06-14 DIAGNOSIS — I7123 Aneurysm of the descending thoracic aorta, without rupture: Secondary | ICD-10-CM | POA: Diagnosis not present

## 2024-06-14 DIAGNOSIS — R600 Localized edema: Secondary | ICD-10-CM

## 2024-06-17 DIAGNOSIS — D72829 Elevated white blood cell count, unspecified: Secondary | ICD-10-CM | POA: Diagnosis not present

## 2024-06-17 DIAGNOSIS — R1031 Right lower quadrant pain: Secondary | ICD-10-CM | POA: Diagnosis not present

## 2024-06-17 DIAGNOSIS — N320 Bladder-neck obstruction: Secondary | ICD-10-CM | POA: Diagnosis not present

## 2024-06-17 DIAGNOSIS — R051 Acute cough: Secondary | ICD-10-CM | POA: Diagnosis not present

## 2024-06-17 DIAGNOSIS — J189 Pneumonia, unspecified organism: Secondary | ICD-10-CM | POA: Diagnosis not present

## 2024-06-17 DIAGNOSIS — D638 Anemia in other chronic diseases classified elsewhere: Secondary | ICD-10-CM | POA: Diagnosis not present

## 2024-06-17 DIAGNOSIS — M533 Sacrococcygeal disorders, not elsewhere classified: Secondary | ICD-10-CM | POA: Diagnosis not present

## 2024-06-17 DIAGNOSIS — Z1152 Encounter for screening for COVID-19: Secondary | ICD-10-CM | POA: Diagnosis not present

## 2024-06-17 DIAGNOSIS — R509 Fever, unspecified: Secondary | ICD-10-CM | POA: Diagnosis not present

## 2024-06-20 ENCOUNTER — Telehealth: Payer: Self-pay | Admitting: Internal Medicine

## 2024-06-20 DIAGNOSIS — M4722 Other spondylosis with radiculopathy, cervical region: Secondary | ICD-10-CM | POA: Diagnosis not present

## 2024-06-20 DIAGNOSIS — R509 Fever, unspecified: Secondary | ICD-10-CM | POA: Diagnosis not present

## 2024-06-20 DIAGNOSIS — J189 Pneumonia, unspecified organism: Secondary | ICD-10-CM | POA: Diagnosis not present

## 2024-06-20 DIAGNOSIS — R051 Acute cough: Secondary | ICD-10-CM | POA: Diagnosis not present

## 2024-06-20 DIAGNOSIS — M542 Cervicalgia: Secondary | ICD-10-CM | POA: Diagnosis not present

## 2024-06-20 NOTE — Telephone Encounter (Signed)
 Called and spoke with [name] per DPR to see if patient can come into today at 1:30 per Dr. Geronimo request. Pts wife stated pt has appt today and that will be to much running.   [name] states she would like to keep appointment option for tomorrow at 11:00

## 2024-06-20 NOTE — Telephone Encounter (Signed)
 [name] states 11:00 tomorrow good for them. [place] please sched. Thanks.

## 2024-06-20 NOTE — Telephone Encounter (Signed)
 I will have to see [name] ASAP next week. I do not mind coming from [facility] to see him in clinic as special add on or next week earlier than normal or last patient of day. He has pneumonia and increasing o2 needs per wife  Even 06/21/24 when I am at [place] I can come in lae late morning

## 2024-06-21 ENCOUNTER — Other Ambulatory Visit: Payer: Self-pay

## 2024-06-21 ENCOUNTER — Encounter (HOSPITAL_COMMUNITY): Payer: Self-pay | Admitting: Urology

## 2024-06-21 ENCOUNTER — Encounter (HOSPITAL_COMMUNITY): Payer: Self-pay

## 2024-06-21 ENCOUNTER — Inpatient Hospital Stay (HOSPITAL_COMMUNITY)
Admit: 2024-06-21 | Discharge: 2024-06-26 | DRG: 673 | Disposition: A | Discharge status: Home / Self Care | Attending: Family Medicine | Admitting: Family Medicine

## 2024-06-21 ENCOUNTER — Ambulatory Visit: Admitting: Internal Medicine

## 2024-06-21 ENCOUNTER — Inpatient Hospital Stay (HOSPITAL_COMMUNITY)

## 2024-06-21 DIAGNOSIS — Z7982 Long term (current) use of aspirin: Secondary | ICD-10-CM

## 2024-06-21 DIAGNOSIS — Z881 Allergy status to other antibiotic agents status: Secondary | ICD-10-CM

## 2024-06-21 DIAGNOSIS — M315 Giant cell arteritis with polymyalgia rheumatica: Secondary | ICD-10-CM | POA: Diagnosis present

## 2024-06-21 DIAGNOSIS — Z7952 Long term (current) use of systemic steroids: Secondary | ICD-10-CM

## 2024-06-21 DIAGNOSIS — G4700 Insomnia, unspecified: Secondary | ICD-10-CM | POA: Diagnosis not present

## 2024-06-21 DIAGNOSIS — T8361XA Infection and inflammatory reaction due to implanted penile prosthesis, initial encounter: Principal | ICD-10-CM | POA: Diagnosis present

## 2024-06-21 DIAGNOSIS — L89313 Pressure ulcer of right buttock, stage 3: Secondary | ICD-10-CM | POA: Diagnosis present

## 2024-06-21 DIAGNOSIS — Z79899 Other long term (current) drug therapy: Secondary | ICD-10-CM

## 2024-06-21 DIAGNOSIS — Z825 Family history of asthma and other chronic lower respiratory diseases: Secondary | ICD-10-CM

## 2024-06-21 DIAGNOSIS — F32A Depression, unspecified: Secondary | ICD-10-CM | POA: Diagnosis present

## 2024-06-21 DIAGNOSIS — K219 Gastro-esophageal reflux disease without esophagitis: Secondary | ICD-10-CM | POA: Diagnosis present

## 2024-06-21 DIAGNOSIS — I110 Hypertensive heart disease with heart failure: Secondary | ICD-10-CM | POA: Diagnosis present

## 2024-06-21 DIAGNOSIS — J849 Interstitial pulmonary disease, unspecified: Secondary | ICD-10-CM | POA: Diagnosis present

## 2024-06-21 DIAGNOSIS — I739 Peripheral vascular disease, unspecified: Secondary | ICD-10-CM | POA: Diagnosis present

## 2024-06-21 DIAGNOSIS — Z7901 Long term (current) use of anticoagulants: Secondary | ICD-10-CM | POA: Diagnosis not present

## 2024-06-21 DIAGNOSIS — Z8739 Personal history of other diseases of the musculoskeletal system and connective tissue: Secondary | ICD-10-CM | POA: Diagnosis not present

## 2024-06-21 DIAGNOSIS — J9621 Acute and chronic respiratory failure with hypoxia: Secondary | ICD-10-CM | POA: Diagnosis present

## 2024-06-21 DIAGNOSIS — J90 Pleural effusion, not elsewhere classified: Secondary | ICD-10-CM | POA: Diagnosis not present

## 2024-06-21 DIAGNOSIS — K7689 Other specified diseases of liver: Secondary | ICD-10-CM | POA: Diagnosis not present

## 2024-06-21 DIAGNOSIS — F419 Anxiety disorder, unspecified: Secondary | ICD-10-CM | POA: Diagnosis present

## 2024-06-21 DIAGNOSIS — E785 Hyperlipidemia, unspecified: Secondary | ICD-10-CM | POA: Diagnosis not present

## 2024-06-21 DIAGNOSIS — H9190 Unspecified hearing loss, unspecified ear: Secondary | ICD-10-CM | POA: Diagnosis present

## 2024-06-21 DIAGNOSIS — Z86711 Personal history of pulmonary embolism: Secondary | ICD-10-CM

## 2024-06-21 DIAGNOSIS — R059 Cough, unspecified: Secondary | ICD-10-CM | POA: Diagnosis not present

## 2024-06-21 DIAGNOSIS — N390 Urinary tract infection, site not specified: Secondary | ICD-10-CM | POA: Diagnosis not present

## 2024-06-21 DIAGNOSIS — Z8249 Family history of ischemic heart disease and other diseases of the circulatory system: Secondary | ICD-10-CM | POA: Diagnosis not present

## 2024-06-21 DIAGNOSIS — I7121 Aneurysm of the ascending aorta, without rupture: Secondary | ICD-10-CM | POA: Diagnosis not present

## 2024-06-21 DIAGNOSIS — N400 Enlarged prostate without lower urinary tract symptoms: Secondary | ICD-10-CM | POA: Diagnosis not present

## 2024-06-21 DIAGNOSIS — I480 Paroxysmal atrial fibrillation: Secondary | ICD-10-CM | POA: Diagnosis not present

## 2024-06-21 DIAGNOSIS — N401 Enlarged prostate with lower urinary tract symptoms: Secondary | ICD-10-CM | POA: Diagnosis present

## 2024-06-21 DIAGNOSIS — N179 Acute kidney failure, unspecified: Secondary | ICD-10-CM | POA: Diagnosis present

## 2024-06-21 DIAGNOSIS — L89153 Pressure ulcer of sacral region, stage 3: Secondary | ICD-10-CM | POA: Diagnosis not present

## 2024-06-21 DIAGNOSIS — I517 Cardiomegaly: Secondary | ICD-10-CM | POA: Diagnosis not present

## 2024-06-21 DIAGNOSIS — T8361XD Infection and inflammatory reaction due to implanted penile prosthesis, subsequent encounter: Secondary | ICD-10-CM | POA: Diagnosis not present

## 2024-06-21 DIAGNOSIS — N4889 Other specified disorders of penis: Secondary | ICD-10-CM | POA: Diagnosis present

## 2024-06-21 DIAGNOSIS — J189 Pneumonia, unspecified organism: Secondary | ICD-10-CM | POA: Diagnosis present

## 2024-06-21 DIAGNOSIS — Z885 Allergy status to narcotic agent status: Secondary | ICD-10-CM

## 2024-06-21 DIAGNOSIS — Z86718 Personal history of other venous thrombosis and embolism: Secondary | ICD-10-CM

## 2024-06-21 DIAGNOSIS — I7143 Infrarenal abdominal aortic aneurysm, without rupture: Secondary | ICD-10-CM | POA: Diagnosis not present

## 2024-06-21 DIAGNOSIS — R0603 Acute respiratory distress: Secondary | ICD-10-CM | POA: Diagnosis not present

## 2024-06-21 DIAGNOSIS — Y831 Surgical operation with implant of artificial internal device as the cause of abnormal reaction of the patient, or of later complication, without mention of misadventure at the time of the procedure: Secondary | ICD-10-CM | POA: Diagnosis present

## 2024-06-21 DIAGNOSIS — N368 Other specified disorders of urethra: Secondary | ICD-10-CM | POA: Diagnosis present

## 2024-06-21 DIAGNOSIS — Z806 Family history of leukemia: Secondary | ICD-10-CM

## 2024-06-21 DIAGNOSIS — I5032 Chronic diastolic (congestive) heart failure: Secondary | ICD-10-CM | POA: Diagnosis present

## 2024-06-21 DIAGNOSIS — I771 Stricture of artery: Secondary | ICD-10-CM | POA: Diagnosis not present

## 2024-06-21 DIAGNOSIS — A419 Sepsis, unspecified organism: Secondary | ICD-10-CM | POA: Diagnosis not present

## 2024-06-21 DIAGNOSIS — Z01811 Encounter for preprocedural respiratory examination: Secondary | ICD-10-CM | POA: Diagnosis not present

## 2024-06-21 DIAGNOSIS — T8361XS Infection and inflammatory reaction due to implanted penile prosthesis, sequela: Secondary | ICD-10-CM | POA: Diagnosis not present

## 2024-06-21 DIAGNOSIS — L89323 Pressure ulcer of left buttock, stage 3: Secondary | ICD-10-CM | POA: Diagnosis present

## 2024-06-21 DIAGNOSIS — N281 Cyst of kidney, acquired: Secondary | ICD-10-CM | POA: Diagnosis not present

## 2024-06-21 DIAGNOSIS — J9811 Atelectasis: Secondary | ICD-10-CM | POA: Diagnosis not present

## 2024-06-21 DIAGNOSIS — R54 Age-related physical debility: Secondary | ICD-10-CM | POA: Diagnosis present

## 2024-06-21 DIAGNOSIS — Z888 Allergy status to other drugs, medicaments and biological substances status: Secondary | ICD-10-CM

## 2024-06-21 DIAGNOSIS — Z1611 Resistance to penicillins: Secondary | ICD-10-CM | POA: Diagnosis present

## 2024-06-21 DIAGNOSIS — R339 Retention of urine, unspecified: Secondary | ICD-10-CM | POA: Diagnosis not present

## 2024-06-21 LAB — COMPREHENSIVE METABOLIC PANEL WITH GFR
ALT: 27 U/L (ref 0–44)
AST: 22 U/L (ref 15–41)
Albumin: 3 g/dL — ABNORMAL LOW (ref 3.5–5.0)
Alkaline Phosphatase: 70 U/L (ref 38–126)
Anion gap: 8 (ref 5–15)
BUN: 28 mg/dL — ABNORMAL HIGH (ref 8–23)
CO2: 26 mmol/L (ref 22–32)
Calcium: 8.4 mg/dL — ABNORMAL LOW (ref 8.9–10.3)
Chloride: 103 mmol/L (ref 98–111)
Creatinine, Ser: 1.12 mg/dL (ref 0.61–1.24)
GFR, Estimated: >60 mL/min (ref >60)
Glucose, Bld: 158 mg/dL — ABNORMAL HIGH (ref 70–99)
Potassium: 4.7 mmol/L (ref 3.5–5.1)
Sodium: 137 mmol/L (ref 135–145)
Total Bilirubin: 0.4 mg/dL (ref 0.0–1.2)
Total Protein: 6.4 g/dL — ABNORMAL LOW (ref 6.5–8.1)

## 2024-06-21 LAB — TYPE AND SCREEN
ABO/RH(D): O POS
Antibody Screen: NEGATIVE

## 2024-06-21 LAB — APTT: aPTT: 31 s (ref 24–36)

## 2024-06-21 LAB — HEMOGLOBIN AND HEMATOCRIT, BLOOD
HCT: 32.8 % — ABNORMAL LOW (ref 39.0–52.0)
Hemoglobin: 10.4 g/dL — ABNORMAL LOW (ref 13.0–17.0)

## 2024-06-21 LAB — PROTIME-INR
INR: 1.1 (ref 0.8–1.2)
Prothrombin Time: 15.3 s — ABNORMAL HIGH (ref 11.4–15.2)

## 2024-06-21 MED ORDER — PREDNISONE 10 MG PO TABS
10.0000 mg | ORAL_TABLET | Freq: Every day | ORAL | Status: DC
  Administered 2024-06-22 – 2024-06-26 (×4): 10 mg via ORAL
  Filled 2024-06-21 (×3): qty 1
  Filled 2024-06-21: qty 2

## 2024-06-21 MED ORDER — DOCUSATE SODIUM 100 MG PO CAPS
100.0000 mg | ORAL_CAPSULE | Freq: Two times a day (BID) | ORAL | Status: DC
  Administered 2024-06-21 – 2024-06-26 (×8): 100 mg via ORAL
  Filled 2024-06-21 (×8): qty 1

## 2024-06-21 MED ORDER — MELATONIN 5 MG PO TABS
5.0000 mg | ORAL_TABLET | Freq: Every day | ORAL | Status: DC
  Administered 2024-06-21 – 2024-06-25 (×5): 5 mg via ORAL
  Filled 2024-06-21 (×5): qty 1

## 2024-06-21 MED ORDER — SODIUM CHLORIDE 0.9 % IV SOLN: INTRAVENOUS | Status: AC

## 2024-06-21 MED ORDER — PIPERACILLIN-TAZOBACTAM 3.375 G IVPB
3.3750 g | Freq: Three times a day (TID) | INTRAVENOUS | Status: DC
  Administered 2024-06-21 – 2024-06-24 (×8): 3.375 g via INTRAVENOUS
  Filled 2024-06-21 (×8): qty 50

## 2024-06-21 MED ORDER — SENNA 8.6 MG PO TABS
1.0000 | ORAL_TABLET | Freq: Two times a day (BID) | ORAL | Status: DC
  Administered 2024-06-22 – 2024-06-26 (×5): 8.6 mg via ORAL
  Filled 2024-06-21 (×7): qty 1

## 2024-06-21 MED ORDER — HYDROCODONE-ACETAMINOPHEN 5-325 MG PO TABS
1.0000 | ORAL_TABLET | Freq: Four times a day (QID) | ORAL | Status: DC | PRN
  Administered 2024-06-21 – 2024-06-26 (×7): 1 via ORAL
  Filled 2024-06-21 (×7): qty 1

## 2024-06-21 MED ORDER — PREGABALIN 75 MG PO CAPS
75.0000 mg | ORAL_CAPSULE | Freq: Every day | ORAL | Status: DC
  Administered 2024-06-21 – 2024-06-22 (×2): 75 mg via ORAL
  Filled 2024-06-21 (×2): qty 1

## 2024-06-21 MED ORDER — ACYCLOVIR 400 MG PO TABS
400.0000 mg | ORAL_TABLET | Freq: Two times a day (BID) | ORAL | Status: DC
Start: 2024-06-21 — End: 2024-06-26
  Administered 2024-06-21 – 2024-06-26 (×9): 400 mg via ORAL
  Filled 2024-06-21 (×11): qty 1

## 2024-06-21 MED ORDER — VANCOMYCIN HCL 750 MG/150ML IV SOLN: 750.0000 mg | Freq: Two times a day (BID) | INTRAVENOUS | Status: DC

## 2024-06-21 MED ORDER — TAMSULOSIN HCL 0.4 MG PO CAPS
0.8000 mg | ORAL_CAPSULE | Freq: Every day | ORAL | Status: DC
  Administered 2024-06-21: 0.8 mg via ORAL
  Filled 2024-06-21 (×2): qty 2

## 2024-06-21 MED ORDER — POLYETHYLENE GLYCOL 3350 17 G PO PACK
17.0000 g | PACK | Freq: Two times a day (BID) | ORAL | Status: DC
  Administered 2024-06-21 – 2024-06-25 (×7): 17 g via ORAL
  Filled 2024-06-21 (×9): qty 1

## 2024-06-21 MED ORDER — IPRATROPIUM-ALBUTEROL 0.5-2.5 (3) MG/3ML IN SOLN: 3.0000 mL | Freq: Four times a day (QID) | RESPIRATORY_TRACT | Status: DC | PRN

## 2024-06-21 MED ORDER — VANCOMYCIN HCL 1500 MG/300ML IV SOLN
1500.0000 mg | Freq: Once | INTRAVENOUS | Status: AC
  Administered 2024-06-21: 1500 mg via INTRAVENOUS
  Filled 2024-06-21: qty 300

## 2024-06-21 MED ORDER — VANCOMYCIN HCL IN DEXTROSE 1-5 GM/200ML-% IV SOLN
1000.0000 mg | INTRAVENOUS | Status: DC
  Administered 2024-06-22 – 2024-06-23 (×2): 1000 mg via INTRAVENOUS
  Filled 2024-06-21 (×2): qty 200

## 2024-06-21 MED ORDER — LORAZEPAM 0.5 MG PO TABS
0.5000 mg | ORAL_TABLET | Freq: Every day | ORAL | Status: DC
  Filled 2024-06-21: qty 1

## 2024-06-21 MED ORDER — FLUOROMETHOLONE 0.1 % OP SUSP
1.0000 [drp] | Freq: Every day | OPHTHALMIC | Status: DC
  Administered 2024-06-21 – 2024-06-25 (×5): 1 [drp] via OPHTHALMIC
  Filled 2024-06-21: qty 5

## 2024-06-21 MED ORDER — PANTOPRAZOLE SODIUM 40 MG PO TBEC
40.0000 mg | DELAYED_RELEASE_TABLET | Freq: Every day | ORAL | Status: DC
  Administered 2024-06-22 – 2024-06-26 (×4): 40 mg via ORAL
  Filled 2024-06-21 (×4): qty 1

## 2024-06-21 MED ORDER — MIRTAZAPINE 15 MG PO TABS
15.0000 mg | ORAL_TABLET | Freq: Every day | ORAL | Status: DC
  Filled 2024-06-21: qty 1

## 2024-06-21 MED ORDER — LORAZEPAM 0.5 MG PO TABS
0.5000 mg | ORAL_TABLET | Freq: Every day | ORAL | Status: DC
Start: 2024-06-21 — End: 2024-06-26
  Administered 2024-06-22 – 2024-06-25 (×4): 0.5 mg via ORAL
  Filled 2024-06-21 (×4): qty 1

## 2024-06-21 MED ORDER — FINASTERIDE 5 MG PO TABS
5.0000 mg | ORAL_TABLET | Freq: Every day | ORAL | Status: DC
  Administered 2024-06-22 – 2024-06-26 (×4): 5 mg via ORAL
  Filled 2024-06-21 (×4): qty 1

## 2024-06-21 MED ORDER — ZOLPIDEM TARTRATE 5 MG PO TABS
5.0000 mg | ORAL_TABLET | Freq: Every evening | ORAL | Status: DC | PRN
  Administered 2024-06-21 – 2024-06-25 (×5): 5 mg via ORAL
  Filled 2024-06-21 (×5): qty 1

## 2024-06-21 MED ORDER — MIRTAZAPINE 15 MG PO TABS
30.0000 mg | ORAL_TABLET | Freq: Every day | ORAL | Status: DC
  Administered 2024-06-21 – 2024-06-25 (×5): 30 mg via ORAL
  Filled 2024-06-21 (×4): qty 2

## 2024-06-21 MED ORDER — FOLIC ACID 1 MG PO TABS
1.0000 mg | ORAL_TABLET | Freq: Every day | ORAL | Status: DC
  Administered 2024-06-22 – 2024-06-26 (×4): 1 mg via ORAL
  Filled 2024-06-21 (×4): qty 1

## 2024-06-21 MED ORDER — ONDANSETRON HCL 4 MG/2ML IJ SOLN
4.0000 mg | INTRAMUSCULAR | Status: DC | PRN
  Administered 2024-06-25: 4 mg via INTRAVENOUS
  Filled 2024-06-21: qty 2

## 2024-06-21 MED ORDER — ACETAMINOPHEN 500 MG PO TABS
1000.0000 mg | ORAL_TABLET | Freq: Four times a day (QID) | ORAL | Status: DC
  Administered 2024-06-23 – 2024-06-26 (×8): 1000 mg via ORAL
  Filled 2024-06-21 (×12): qty 2

## 2024-06-21 MED ORDER — PREDNISONE 5 MG PO TABS
2.5000 mg | ORAL_TABLET | Freq: Every day | ORAL | Status: DC
  Administered 2024-06-22 – 2024-06-26 (×4): 2.5 mg via ORAL
  Filled 2024-06-21 (×4): qty 1

## 2024-06-21 MED ORDER — LORAZEPAM 0.5 MG PO TABS
0.2500 mg | ORAL_TABLET | Freq: Two times a day (BID) | ORAL | Status: DC
  Administered 2024-06-22 – 2024-06-26 (×6): 0.25 mg via ORAL
  Filled 2024-06-21 (×6): qty 1

## 2024-06-21 MED ORDER — ENSURE PLUS HIGH PROTEIN PO LIQD
237.0000 mL | Freq: Three times a day (TID) | ORAL | Status: DC
  Administered 2024-06-22 – 2024-06-26 (×9): 237 mL via ORAL

## 2024-06-21 MED ORDER — ADULT MULTIVITAMIN W/MINERALS CH
1.0000 | ORAL_TABLET | Freq: Every day | ORAL | Status: DC
  Administered 2024-06-22 – 2024-06-26 (×4): 1 via ORAL
  Filled 2024-06-21 (×4): qty 1

## 2024-06-21 NOTE — Consult Note (Signed)
 H&P  Chief Complaint: infected penile prosthesis  History of Present Illness: [name] is a 89 y.o. 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   Here he is afebrile, blood pressure 94/57, normal rate, on 2L [place]. Labs pending, CXR pending.   Past Medical History:  Diagnosis Date   AAA (abdominal aortic aneurysm) (HCC)    Abnormal EKG    Left atrial abnormality   Allergic rhinitis    Allergy    Benign neoplasm of colon 04/14/2010   3 small polyps on colonoscopy by Dr. [name]   Carpal tunnel syndrome    Cataract    Decubitus ulcer of coccygeal region, stage 2 (HCC) 07/06/2023   Degenerative disc disease    Disturbances metabolism of methionine, homocystine, and cystathionine (HCC)    Elevated homocysteine   Elevated prostate specific antigen (PSA)    Elevated PSA 07/06/2023   Hearing loss    Hyperlipidemia    ILD (interstitial lung disease) (HCC) 07/06/2023   Impotence of organic origin    Penile implant   Internal hemorrhoids    Leukocytosis 07/06/2023   Neck pain    Otosclerosis of both ears    Peripheral vascular disease (HCC)    Bilateral femoral bruit   Plantar fasciitis    Polymyalgia rheumatica (HCC)    Polymyalgia rheumatica (HCC) 07/06/2023   Rotator cuff syndrome of left shoulder    Scoliosis    Shoulder pain     Past Surgical History:  Procedure Laterality Date   ABDOMINAL AORTIC ENDOVASCULAR STENT GRAFT N/A 08/16/2017   Procedure: ABDOMINAL AORTIC ENDOVASCULAR STENT GRAFT insertion;  Surgeon: Serene Gaile ORN, MD;  Location: [facility] OR;  Service: Vascular;  Laterality: N/A;    Actinic keratosis removal  06/21/2010   Left shoulder; Dr. Jadine   COLONOSCOPY     COLONOSCOPY W/ POLYPECTOMY     DUPUYTREN CONTRACTURE RELEASE Right 12/05/2013   Procedure: DUPUYTREN CONTRACTURE RELEASE RIGHT LONG, RING AND SMALL FINGERS;  Surgeon: [name]., MD;  Location: [facility];  Service: Orthopedics;  Laterality: Right;   Implant penile pump  2001   IR ANGIOGRAM PELVIS SELECTIVE OR SUPRASELECTIVE  12/11/2020   IR ANGIOGRAM SELECTIVE EACH ADDITIONAL VESSEL  12/11/2020   IR CYSTOSTOMY TUBE PLACEMENT/BLADDER ASPIRATION  06/11/2024   IR EMBO ARTERIAL NOT HEMORR HEMANG INC GUIDE ROADMAPPING  12/11/2020   IR RADIOLOGIST EVAL & MGMT  07/30/2020   IR RADIOLOGIST EVAL & MGMT  08/16/2023   IR US  GUIDE VASC ACCESS RIGHT  12/11/2020   Resection of appendix and tip of rectum  February 2005   STAPEDECTOMY Bilateral 1985, 1988   [employer]    Home Medications:  No current facility-administered medications on file prior to encounter.   Current Outpatient Medications on File Prior to Encounter  Medication Sig Dispense Refill   acetaminophen  (TYLENOL ) 325 MG tablet Take 2 tablets (650 mg total) by mouth every 6 (six) hours as needed for mild pain (or Fever >/= 101).     acyclovir  (ZOVIRAX ) 400 MG tablet Take 400 mg by mouth 2 (two) times daily.     apixaban  (ELIQUIS ) 2.5 MG TABS tablet Take  2.5 mg by mouth 2 (two) times daily.     butalbital -acetaminophen -caffeine  (FIORICET ) 50-325-40 MG tablet Take 1 tablet by mouth daily as needed for headache. 14 tablet 0   cholecalciferol (VITAMIN D3) 25 MCG (1000 UNIT) tablet Take 2,000 Units by mouth daily.     diclofenac  Sodium (VOLTAREN ) 1 % GEL Apply 2 g topically 4 (four) times daily as needed (shoulder pain). 2 g 0   docusate sodium  (COLACE) 100 MG capsule Take 100 mg by mouth daily.     feeding supplement (ENSURE ENLIVE / ENSURE PLUS) LIQD Take 237 mLs by mouth 3 (three) times daily between meals. 237 mL 12   finasteride   (PROSCAR ) 5 MG tablet Take 5 mg by mouth daily.     fluorometholone  (FML) 0.1 % ophthalmic suspension Place 1 drop into the right eye at bedtime.      folic acid  (FOLVITE ) 1 MG tablet Take 1 mg by mouth daily.     lansoprazole (PREVACID) 30 MG capsule Take 30 mg by mouth 2 (two) times daily.     LORazepam  (ATIVAN ) 0.5 MG tablet Take 0.5 mg by mouth daily. 0.25 am, 0.25 pm,  0.5 at night     melatonin 5 MG TABS Take 1 tablet (5 mg total) by mouth at bedtime.     methotrexate (RHEUMATREX) 2.5 MG tablet Take 12.5 mg by mouth once a week.     mirtazapine  (REMERON ) 15 MG tablet Take 15 mg by mouth at bedtime. Pt taking 30 mg     Multiple Vitamins-Minerals (MULTIVITAMIN PO) Take 1 tablet by mouth daily.      ondansetron  (ZOFRAN ) 4 MG tablet Take 1 tablet (4 mg total) by mouth every 4 (four) hours as needed for nausea or vomiting. 100 tablet 6   pantoprazole  (PROTONIX ) 40 MG tablet Take 1 tablet (40 mg total) by mouth daily before breakfast. 30 tablet 3   polyethylene glycol (MIRALAX  / GLYCOLAX ) 17 g packet Take 17 g by mouth 2 (two) times daily.     predniSONE  (DELTASONE ) 10 MG tablet Take 10 mg by mouth daily with breakfast.     predniSONE  (DELTASONE ) 2.5 MG tablet Take 2.5 mg by mouth daily with breakfast.     promethazine  (PHENERGAN ) 12.5 MG tablet Take 1 tablet (12.5 mg total) by mouth daily as needed for nausea or vomiting. 30 tablet 6   psyllium (REGULOID) 0.52 g capsule Take 0.52 g by mouth daily.     senna-docusate (SENNA-S) 8.6-50 MG tablet Take 1 tablet by mouth daily.     tamsulosin  (FLOMAX ) 0.4 MG CAPS capsule Take 0.4 mg by mouth at bedtime. PT TAKES 2 CAPSULES AT HS     T[place] 1200 MG CAPS Take 1 tablet by mouth daily.     traMADol  (ULTRAM ) 50 MG tablet Take 50 mg by mouth every 4 (four) hours as needed for moderate pain or severe pain.     zolpidem  (AMBIEN ) 5 MG tablet Take 5 mg by mouth at bedtime as needed.       Allergies:  Allergies  Allergen Reactions   Daptomycin  Other  (See Comments)    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   Rosuvastatin  Other (See Comments)    Stopped taking due to feeling achy     Codeine Nausea And Vomiting   Morphine  And Codeine Nausea And Vomiting   Omnicef [Cefdinir] Other (See Comments)    Abdominal pain    Family History  Problem Relation Age of Onset   Heart disease Mother  Emphysema Mother    Leukemia Brother        Chronic lymphocytic leukemia   Colon cancer Neg Hx    Esophageal cancer Neg Hx    Rectal cancer Neg Hx    Stomach cancer Neg Hx     Social History:  reports that he has never smoked. He has never used smokeless tobacco. He reports that he does not drink alcohol  and does not use drugs.  ROS: A complete review of systems was performed.  All systems are negative except for pertinent findings as noted.  Physical Exam:  Vital signs in last 24 hours:   Constitutional:  Alert and oriented, No acute distress Cardiovascular: Regular rate and rhythm, No JVD Respiratory: Normal respiratory effort, Lungs clear bilaterally GI: Abdomen is soft, nontender, nondistended, no abdominal masses GU: No CVA tenderness, pus from urethral meatus and generalized edema Lymphatic: No lymphadenopathy Neurologic: Grossly intact, no focal deficits Psychiatric: Normal mood and affect   Laboratory Data:  No results for input(s): WBC, HGB, HCT, PLT in the last 72 hours.  No results for input(s): NA, K, CL, GLUCOSE, BUN, CALCIUM , CREATININE in the last 72 hours.  Invalid input(s): CO3   No results found for this or any previous visit (from the past 24 hours). No results found for this or any previous visit (from the past 240 hours).  Renal Function: No results for input(s): CREATININE in the last 168 hours. Estimated Creatinine Clearance: 42.6 mL/min (by C-G formula based on SCr of 1.24 mg/dL).  Radiologic Imaging: No results found.  Impression/Assessment:  89yo hx above who presents with c/f  penile prosthesis infection. Penis and implant are warm to the touch, purulence is expressible from the meatus, no tenderness. Last Eliquis  dose last night. Will need implant removed. Was being treated for pneumonia, currently on Omnicef, home pulmonologist aware of admission.   Plan:  -NPO @ midnight -Tentative plan for Sunday OR pending clinical course to give adequate Eliquis  washout period  -Vanc/Zosyn  for antibiotics -Labs now and in the AM -CXR now -Hospitalist consult  -Urine culture -If patient fevers, blood cultures and will consider expedited OR   Jacqulyn CHRISTELLA Bound 06/21/2024, 5:41 PM

## 2024-06-21 NOTE — Progress Notes (Signed)
 Pharmacy Antibiotic Note  [name] is a 89 y.o. male admitted on 06/21/2024 with concern for infected penile prosthesis.  Pharmacy has been consulted for vancomycin  dosing.  Today, 06/21/24 Afebrile SCr ordered, most recent value 1.29 on 06/06/24  Plan: Piperacillin /tazobactam 3.375 g IV q8h ordered by MD Vancomycin  1500 mg loading dose followed by 1000 mg IV q24h for estimated AUC of 448 Goal AUC 400-550. Check levels as needed Monitor renal function, culture data, clinical improvement  Height: 6' (182.9 cm) Weight: 80.7 kg (178 lb) IBW/kg (Calculated) : 77.6  Temp (24hrs), Avg:98.3 F (36.8 C), Min:98.3 F (36.8 C), Max:98.3 F (36.8 C)  No results for input(s): WBC, CREATININE, LATICACIDVEN, VANCOTROUGH, VANCOPEAK, VANCORANDOM, GENTTROUGH, GENTPEAK, GENTRANDOM, TOBRATROUGH, TOBRAPEAK, TOBRARND, AMIKACINPEAK, AMIKACINTROU, AMIKACIN in the last 168 hours.  Estimated Creatinine Clearance: 42.6 mL/min (by C-G formula based on SCr of 1.24 mg/dL).    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   Rosuvastatin  Other (See Comments)    Stopped taking due to feeling achy     Codeine Nausea And Vomiting   Morphine  And Codeine Nausea And Vomiting   Omnicef [Cefdinir] Other (See Comments)    Abdominal pain    Antimicrobials this admission: vancomycin  7/25 >>  Piperacillin nadine 7/25 >>   Dose adjustments this admission:  [name], PharmD 06/21/2024 6:46 PM

## 2024-06-21 NOTE — H&P (Signed)
 HPI: 07/22/2019  Patient continues on tamsulosin . He ran out this weekend and noticed his nocturia increased to 3-4 times at night. On Flomax , he voids 1-2 times a night. He tells me that his penile prosthesis is not functioning as well as it used to. He however has no interest in removal and replacement at his age.    03/09/2020: For the past week he has had worsening nocturia getting up almost every hour to void. He denies a precipitating or contributing event to result in this. Symptoms not associated with worsening ability to start his stream, burning or painful urination, visible blood in the urine. He voids every 3 hours during the day without bothersome urgency. He denies constipation. There is no correlating lower back or flank pain/discomfort.   03/23/2020: Tamsulosin  increased to 0.8mg  at time of last OV for elevated residual. He returns today for f/u exam. He reports nocturia has decreased. Only getting up 1-2 times per night, decreased from every hour at previous office visit assessment. He has tolerated the medication increase well without any noted side effect. Denies any interval burning or painful urination, visible blood in the urine. Daytime symptoms remain stable, non bothersome by his report.   10/29/2020: Here today for repeat exam. His voiding symptoms had improved at time of last office visit when tamsulosin  and then increase to 0.8 mg. he was tolerating the medication well without any noted side effect.  Pt doing well today. Staying active and working everyday with his job. He will be 48 he on Christmas Eve but states feeling significantly younger than what his age suggests.  Symptom score sheet today is 8. He has nocturia x2 but is not overly bothered by this. He denies significantly bothersome lower urinary tract symptoms during the daytime. He feels like he is emptying his bladder appropriately and not having any new or worsening difficulty starting/stopping his stream. He  denies interval burning or painful urination, visible blood in the urine, interval treatment for UTI. Urinalysis today is clear, PVR 0 mL.   11/04/2021  Patient doing very well. Remains very active and continues to work and play golf. Voiding symptoms are doing very well with 0.8 mg Flomax .   04/19/2022  Patient continues on 0.8 mg tamsulosin  at night. He is starting to have nocturia. This is his biggest complaint. Often has nocturia every 2 hours. Denies any hematuria dysuria. Unable to leave urine sample. Postvoid residual today is 10.   07/19/2022  Patient continues on 0.8 mg tamsulosin . Started Myrbetriq. Nocturia has improved down to 1-2 times a night. Does have mildly elevated residual of 253 cc. Asymptomatic. Voided at home but was not able to void in the office for sample.   02/01/2023  Patient continues on 0.8 mg tamsulosin  QHS but no longer on Myrbetriq. Voiding symptoms stable. PVR is 183   05/17/2023  Patient has been in a nursing home and recovering from hospitalization. He remains on oxygen  and is not very mobile at this time. Comes in with his wife. Continues on tamsulosin  once daily. He was having lot of constipation, anemia, constant rectal pressure. He underwent a CTA of the abdomen and pelvis that revealed significant prostatomegaly and markedly distended bladder. There was no hydronephrosis. He states that his rectal pain and discomfort improved after manual disimpaction. Otherwise, does not feel like he has any voiding complaints. Does have an elevated residual today but is not symptomatic. Does not feel like he has to strain to void. As noted, he is less  mobile than he has been. He did have a PET scan for vasculitis that revealed some uptake in the right posterior lateral prostate for which prostate cancer cannot be excluded. PSA was performed that was in the 6 range.   07/28/2023  Patient remains on supplemental oxygen  and comes in today in a wheelchair. He remains weak and less  mobile. Has failed multiple voiding trials despite maximal medical management for his BPH. Prior CTA of the abdomen showed significant prostatomegaly   02/16/2024  Patient underwent urodynamics that revealed acontractile bladder. He therefore elected against prostatic artery embolization and continues with Foley catheter. However, the catheter has been irritating his penis and has been uncomfortable. Comes in to discuss different options.   05/08/24: Patient presents today for continued right lower quadrant pain and right gluteal pain. He was seen in the ED in May for concerns of cellulitis of his groin. He was treated with IV to p.o. linezolid  and had improvement in pain and overlying erythema. Patient's wife and caregiver note that he has had some gross hematuria and chills, temperatures of 99.5. He has anxiety and agitation at baseline, however they note that this has worsened over the last couple days as well. He saw Dr. Shayne who started him on doxycycline , he is on day 2. The hematuria has resolved, but they remain concerned about the persistent gluteal and low abdominal pain. The RLQ pain is not reproducible, he is unsure what exacerbates it. Right gluteal pain he says his constant and aching. No lesions, bruising, or bleeding noted. Denies testicular pain or swelling. History is difficult to obtain due to agitation from the patient and poor cooperation from family.   06/18/2024: 89 year old man who had a suprapubic catheter placed 2 days ago presents today with complaints of discomfort with placement. He had a Foley catheter stabilizer that was placed on his leg and this has come off and now his family is concerned that the tube will get dislodged. The catheter is draining well with yellow-colored urine in the bag. The site is intact.   06/21/2024  Over the past several days, the patient has had an increase in penile and scrotal swelling and also some drainage from the penis. He has also had fevers at  home. He was apparently seen and had a chest x-ray and diagnosed with pneumonia. He has been on antibiotics for that. I spoke with him yesterday evening and asked for him to come in today for an evaluation to rule out implant infection.     ALLERGIES: Codeine    MEDICATIONS: Acyclovir  400 MG Tablet  Aspir-Low 81 MG Tablet Delayed Release  Butalbital -APAP-Caffeine  50-325-40 MG Capsule  Fish Oil Omega-3  Lidocaine  HCl Urethral/Mucosal 2 % Prefilled Syringe 1 applicator Per Urethra Q1M for catheter changes  Metafolbic  Mirtazapine  15 MG Tablet 1 tablet PO Daily  Multivitamin  predniSONE  10 MG Tablet tablet  Rosuvastatin  Calcium  5 MG Tablet  Vitamin D3     GU PSH: Complex cystometrogram, w/ void pressure and urethral pressure profile studies, any technique - 08/23/2023 Complex Uroflow - 08/23/2023 Emg surf Electrd - 08/23/2023 Intrabd voidng Press - 08/23/2023     NON-GU PSH: Abdominal aortic aneurysm repair (open) Visit Complexity (formerly GPC1X) - 06/13/2024, 05/08/2024, 02/16/2024, 01/16/2024, 07/28/2023, 06/16/2023, 05/17/2023, 02/01/2023     GU PMH: Urinary Retention - 06/13/2024, - 05/08/2024, - 02/16/2024, - 01/16/2024, - 08/23/2023, - 07/28/2023, - 07/14/2023 BPH w/LUTS - 05/08/2024, - 02/16/2024, - 07/28/2023, - 07/14/2023, - 06/16/2023, - 05/17/2023, - 02/01/2023, -  07/19/2022, - 2023, - 2022, - 2021, - 2018 ED due to arterial insufficiency - 05/08/2024, - 2020 Incomplete bladder emptying - 07/04/2023, - 06/23/2023, - 06/16/2023, - 05/17/2023, - 02/01/2023, - 07/19/2022, - 2023, - 2022, - 2021, - 2021 Nocturia - 05/17/2023, - 02/01/2023, - 07/19/2022, - 2023, - 2021, - 2018, - 2018    NON-GU PMH: No Non-GU PMH    FAMILY HISTORY: Heart problem - Mother   SOCIAL HISTORY: Marital Status: Married Preferred Language: English; Race: White Current Smoking Status: Patient has never smoked.   Tobacco Use Assessment Completed: Used Tobacco in last 30 days? Drinks 3 caffeinated drinks per day. Patient's occupation  Geologist, engineering.     Notes: 2 sons    REVIEW OF SYSTEMS:    GU Review Male:   Patient denies frequent urination, hard to postpone urination, burning/ pain with urination, get up at night to urinate, leakage of urine, stream starts and stops, trouble starting your stream, have to strain to urinate , erection problems, and penile pain.  Gastrointestinal (Upper):   Patient denies nausea, vomiting, and indigestion/ heartburn.  Gastrointestinal (Lower):   Patient denies diarrhea and constipation.  Constitutional:   Patient denies fever, night sweats, weight loss, and fatigue.  Skin:   Patient denies skin rash/ lesion and itching.  Eyes:   Patient denies blurred vision and double vision.  Ears/ Nose/ Throat:   Patient denies sore throat and sinus problems.  Hematologic/Lymphatic:   Patient denies swollen glands and easy bruising.  Cardiovascular:   Patient denies leg swelling and chest pains.  Respiratory:   Patient denies cough and shortness of breath.  Endocrine:   Patient denies excessive thirst.  Musculoskeletal:   Patient denies joint pain and back pain.  Neurological:   Patient denies headaches and dizziness.  Psychologic:   Patient denies depression and anxiety.   VITAL SIGNS:      06/21/2024 09:47 AM  BP 95/58 mmHg  Heart Rate 98 /min  Temperature 97.0 F / 36.1 C   GU PHYSICAL EXAMINATION:    Testes: Mild tenderness on the right with some induration of the right scrotum. Pump is fixed in position  Penis: Uncircumcised. There is pus from the meatus. He has some penile edema   MULTI-SYSTEM PHYSICAL EXAMINATION:    Constitutional: Frail elderly male in no acute distress on supplemental oxygen . No increased work of breathing  Respiratory: No labored breathing, no use of accessory muscles. On supplemental oxygen   Musculoskeletal: Required assistance with standing. Unsteady gait     Complexity of Data:  Source Of History:  Patient  Records Review:   Previous Doctor  Records, Previous Patient Records   PROCEDURES:          Visit Complexity - G2211    ASSESSMENT:      ICD-10 Details  1 GU:   Infection and inflammatory reaction due to implanted penile prosthesis, initial encounter - T83.61XA Undiagnosed New Problem  2   Urinary Retention - R33.8 Chronic, Stable   PLAN:           Document Letter(s):  Created for Patient: Clinical Summary         Notes:   Patient has an infected penile prosthesis. Recommend admission to the hospital. Will set that up. He is on Eliquis . Last dose was last night. Will have him hold the Eliquis  and start IV antibiotics and fluids. I will consult the hospitalist service and have already spoken with his pulmonologist. I discussed the risk and benefits  of the procedure. Primary concern is age, health status and recovery from anesthesia. I expressed my concern with this. However, the implant needs to be removed or the infection will worsen.   cc: Dr Shayne    Signed by Sherwood Edison, III, M.D. on 06/21/24 at 11:55 AM (EDT

## 2024-06-21 NOTE — Telephone Encounter (Signed)
 THanks and will see patient at 11Am today . However, I do not see patient on my schedule for 11am today

## 2024-06-21 NOTE — Telephone Encounter (Signed)
 Thank you, Dr. Con, could you please sched? Thanks.

## 2024-06-21 NOTE — Telephone Encounter (Signed)
 No need for appt  I have cancelled it  Pt is being admitted to hospital 06/21/24

## 2024-06-21 NOTE — Consult Note (Signed)
 Consultation Progress Note   Patient: [name] FMW:[id number] DOB: 1930-05-16 DOA: 06/21/2024 DOS: the patient was seen and examined on 06/21/2024 Primary service: Randall Sherwood Randall DOUGLAS, MD  Brief hospital course: [name] is a 89 year old with PMH of BPH with LUTS, urinary retention s/p suprapubic catheter, HLD, HTN, interstitial lung disease, anxiety and depression, polymyalgia rheumatica, paroxysmal A-fib on Eliquis , PE/DVT, AAA s/p stent graft in 2018, chronic diastolic HF, IDA and a recently diagnosed pneumonia who was directly admitted by urology for infected penile prosthesis.  TRH consulted to manage medical problems.  Assessment and Plan:  # Acute on chronic hypoxic respiratory failure # Interstitial lung disease # Community-acquired pneumonia - Diagnosed with LLL PNA by PCP on 7/21 and started on Augmentin and azithromycin  - Has increased O2 requirement from 1 L [place] at baseline to 2 L this week - Patient reports improvement in his respiratory symptoms, no fevers in the last 2 days - Repeat CXR shows resolution of the focal lung infiltrate - Continue IV vancomycin  and Zosyn  - As needed DuoNebs - Incentive spirometer, flutter valve - Pulse Ox with ambulation tomorrow  # Paroxysmal A-fib - Rate controlled, HR in the 80s - EKG shows sinus tach - Last Eliquis  dose on p.m. of 7/24, continue to hold pending surgery in the next 1-2 days  # Polymyalgia rheumatica # Right shoulder pain - Follows with [facility], on prednisone  12.5 mg daily and methotrexate 12.5 mg weekly - Hold methotrexate in the setting of acute infection - On >10 mg/day of steroids, stress dose steroids not indicated at the moment - Continue prednisone  and Lyrica  # IDA - Hgb of 10.4, baseline Hgb of 11-12 - Trend CBC  # Hx of DVT/PE - Hold Eliquis   # Anxiety and depression - Continue mirtazapine  and lorazepam   # Insomnia - Continue melatonin and as needed zolpidem   # Hx of herpes  ophthalmicus - Continue acyclovir  to prevent herpes flare  # AAA/ s/p Repair - Appears stable on recent CT  # GERD - Continue PPI  # BPH # Urinary retention s/p suprapubic catheter - Continue finasteride  and tamsulosin  - Foley management per primary team  # Infected penile implant - Management per primary team     TRH will continue to follow the patient.  Subjective: Patient evaluated at bedside with spouse and caretaker in the room.  Patient sitting in chair eating a salad. Reports he was diagnosed with pneumonia on Monday and has been taking his antibiotics as prescribed. He reports improvement in his cough he has not had any fevers over the last 2 days. Remains on 2 L [place] which is an increase from his baseline 1 L [place] at home. He denies any shortness of breath, chills, fever, abdominal pain, leg swelling, nausea or vomiting.  Physical Exam: Vitals:   06/21/24 1741 06/21/24 1803  BP: (!) 94/57 (!) 94/57  Pulse: 81 81  Resp: 16 16  Temp: 98.3 F (36.8 C) 98.3 F (36.8 C)  TempSrc: Oral Oral  SpO2: 100%   Weight:  80.7 kg  Height:  6' (1.829 m)   General: Pleasant, well-appearing elderly man sitting in chair. No acute distress. HEENT: [place]/AT. Anicteric sclera CV: RRR. No murmurs, rubs, or gallops. No LE edema Pulmonary: On 2LNC. Lungs CTAB. Normal effort. Fine crackles. No wheezing or rhonchi. Abdominal: Soft, nontender, nondistended. Normal bowel sounds. Extremities: Palpable radial and DP pulses. Normal ROM. Skin: Warm and dry. No obvious rash or lesions. GU: Suprapubic catheter stable in  place with clear yellowish urine in bag Neuro: A&Ox3. Moves all extremities. Normal sensation to light touch. No focal deficit. Psych: Normal mood and affect  Data Reviewed:  Results are pending, will review when available.    Latest Ref Rng & Units 06/21/2024    9:04 PM 06/11/2024    8:53 AM 06/06/2024    2:28 PM  CBC  WBC 4.0 - 10.5 K/uL  14.6  13.2   Hemoglobin 13.0 - 17.0  g/dL 89.5  88.6  88.9   Hematocrit 39.0 - 52.0 % 32.8  34.9  33.0   Platelets 150 - 400 K/uL  338  258       Latest Ref Rng & Units 06/21/2024    9:04 PM 06/11/2024    8:53 AM 06/06/2024    2:28 PM  CMP  Glucose 70 - 99 mg/dL 841  97  897   BUN 8 - 23 mg/dL 28  31  44   Creatinine 0.61 - 1.24 mg/dL 8.87  8.75  8.70   Sodium 135 - 145 mmol/L 137  140  141   Potassium 3.5 - 5.1 mmol/L 4.7  4.0  4.4   Chloride 98 - 111 mmol/L 103  103  102   CO2 22 - 32 mmol/L 26  28  32   Calcium  8.9 - 10.3 mg/dL 8.4  9.1  9.7   Total Protein 6.5 - 8.1 g/dL 6.4   6.9   Total Bilirubin 0.0 - 1.2 mg/dL 0.4   0.3   Alkaline Phos 38 - 126 U/L 70   73   AST 15 - 41 U/L 22   23   ALT 0 - 44 U/L 27   30      Family Communication: Discussed plan with spouse and caretaker at bedside  Time spent: 62 minutes.  Author: Claretta CHRISTELLA Alderman, MD 06/21/2024 6:44 PM  For on call review [email or web address].

## 2024-06-22 ENCOUNTER — Inpatient Hospital Stay (HOSPITAL_COMMUNITY)

## 2024-06-22 DIAGNOSIS — T8361XD Infection and inflammatory reaction due to implanted penile prosthesis, subsequent encounter: Secondary | ICD-10-CM | POA: Diagnosis not present

## 2024-06-22 DIAGNOSIS — Z01811 Encounter for preprocedural respiratory examination: Secondary | ICD-10-CM

## 2024-06-22 DIAGNOSIS — N179 Acute kidney failure, unspecified: Secondary | ICD-10-CM

## 2024-06-22 DIAGNOSIS — J849 Interstitial pulmonary disease, unspecified: Secondary | ICD-10-CM | POA: Diagnosis not present

## 2024-06-22 DIAGNOSIS — Z7952 Long term (current) use of systemic steroids: Secondary | ICD-10-CM

## 2024-06-22 LAB — BASIC METABOLIC PANEL WITH GFR
Anion gap: 8 (ref 5–15)
Anion gap: 9 (ref 5–15)
BUN: 30 mg/dL — ABNORMAL HIGH (ref 8–23)
BUN: 28 mg/dL — ABNORMAL HIGH (ref 8–23)
CO2: 25 mmol/L (ref 22–32)
CO2: 23 mmol/L (ref 22–32)
Calcium: 7.9 mg/dL — ABNORMAL LOW (ref 8.9–10.3)
Calcium: 7.2 mg/dL — ABNORMAL LOW (ref 8.9–10.3)
Chloride: 107 mmol/L (ref 98–111)
Chloride: 105 mmol/L (ref 98–111)
Creatinine, Ser: 1.37 mg/dL — ABNORMAL HIGH (ref 0.61–1.24)
Creatinine, Ser: 1.23 mg/dL (ref 0.61–1.24)
GFR, Estimated: 49 mL/min — ABNORMAL LOW (ref >60)
GFR, Estimated: 55 mL/min — ABNORMAL LOW (ref >60)
Glucose, Bld: 107 mg/dL — ABNORMAL HIGH (ref 70–99)
Glucose, Bld: 135 mg/dL — ABNORMAL HIGH (ref 70–99)
Potassium: 4.3 mmol/L (ref 3.5–5.1)
Potassium: 4 mmol/L (ref 3.5–5.1)
Sodium: 140 mmol/L (ref 135–145)
Sodium: 137 mmol/L (ref 135–145)

## 2024-06-22 LAB — CBC
HCT: 31.9 % — ABNORMAL LOW (ref 39.0–52.0)
Hemoglobin: 9.9 g/dL — ABNORMAL LOW (ref 13.0–17.0)
MCH: 32.9 pg (ref 26.0–34.0)
MCHC: 31 g/dL (ref 30.0–36.0)
MCV: 106 fL — ABNORMAL HIGH (ref 80.0–100.0)
Platelets: 303 K/uL (ref 150–400)
RBC: 3.01 MIL/uL — ABNORMAL LOW (ref 4.22–5.81)
RDW: 18.6 % — ABNORMAL HIGH (ref 11.5–15.5)
WBC: 20.2 K/uL — ABNORMAL HIGH (ref 4.0–10.5)
nRBC: 0 % (ref 0.0–0.2)

## 2024-06-22 LAB — BRAIN NATRIURETIC PEPTIDE: B Natriuretic Peptide: 116.1 pg/mL — ABNORMAL HIGH (ref 0.0–100.0)

## 2024-06-22 LAB — URINALYSIS, COMPLETE (UACMP) WITH MICROSCOPIC
Bilirubin Urine: NEGATIVE
Glucose, UA: NEGATIVE mg/dL
Ketones, ur: NEGATIVE mg/dL
Nitrite: NEGATIVE
Protein, ur: NEGATIVE mg/dL
Specific Gravity, Urine: 1.012 (ref 1.005–1.030)
pH: 5 (ref 5.0–8.0)

## 2024-06-22 LAB — PROCALCITONIN: Procalcitonin: 0.82 ng/mL

## 2024-06-22 LAB — TROPONIN I (HIGH SENSITIVITY): Troponin I (High Sensitivity): 25 ng/L — ABNORMAL HIGH (ref <18)

## 2024-06-22 LAB — LACTIC ACID, PLASMA: Lactic Acid, Venous: 1.1 mmol/L (ref 0.5–1.9)

## 2024-06-22 LAB — SEDIMENTATION RATE: Sed Rate: 75 mm/h — ABNORMAL HIGH (ref 0–16)

## 2024-06-22 LAB — STREP PNEUMONIAE URINARY ANTIGEN: Strep Pneumo Urinary Antigen: NEGATIVE

## 2024-06-22 MED ORDER — GERHARDT'S BUTT CREAM
TOPICAL_CREAM | Freq: Two times a day (BID) | CUTANEOUS | Status: DC
  Administered 2024-06-22 – 2024-06-25 (×2): 1 via TOPICAL
  Filled 2024-06-22: qty 60

## 2024-06-22 MED ORDER — IOHEXOL 9 MG/ML PO SOLN
500.0000 mL | ORAL | Status: AC
  Administered 2024-06-22 (×2): 500 mL via ORAL

## 2024-06-22 NOTE — Progress Notes (Signed)
 S: Patient is seen and examined on rounds this afternoon.  He has no specific complaints.  He asked about prosthesis removal.  O: Vitals:   06/22/24 1047 06/22/24 1048  BP:    Pulse: 88 91  Resp:    Temp:    SpO2: (!) 83% 96%   He is sleepy but arousable, alert and oriented  No acute distress No labored breathing Abdomen soft and nontender, SP tube in place, urine clear GU-penis and scrotum remain edematous with erythema but there is no necrosis or crepitus Extremity-no calf pain or swelling  I discussed the patient with Dr. Carolee and discussed the patient with anesthesia.  Reviewed operative note with Dr. Carolee.  Infrapubic IPP, 16 cm cylinder with 2 cm rear-tip extenders bilaterally.  Right submuscular reservoir placement.  Done in 2002.  CT of abdomen and pelvis-official read pending.  Appears benign.  Do not see evidence of bowel injury and suprapubic tube appears well away from the IPP, tubing and reservoir.  Assessment/plan: 89 year old with history of urinary retention and suprapubic tube placement now with infected penile prosthesis.  After careful consideration with pulmonary critical care and anesthesia we will plan for IPP explant possible cystoscopy tomorrow morning with general anesthesia vs waiting longer for spinal anesthesia which might risk a systemic infection, necrotizing infection, bacteremia, etc.

## 2024-06-22 NOTE — Consult Note (Addendum)
 NAME:  [name], MRN:  [id number], DOB:  September 01, 1930, LOS: 1 ADMISSION DATE:  06/21/2024, CONSULTATION DATE:/26/25 REFERRING MD: Dr. Sherwood Edison, CHIEF COMPLAINT: Pulmonary fibrosis, recent pneumonia and also preoperative pulmonary risk evaluation   History of Present Illness: Provided by patient, his caretaker [name] and wife and reviewed the records.   [name] well-known to me from the pulmonary clinic.  He is retired and the first elected [address] [place].  He has multiple medical issues putting in at [facility] for the last few years she has had unexplained high ESR for which she has been inter on chronic prednisone  for consideration of PMR [in 2025 saw a rheumatology at [facility] and started methotrexate,?  Temporal arteritis].  In the background he is always had interstitial lung abnormalities [ILA] otherwise call pre-ILD which had been monitored and less recognized.    Since early 2024 has had a decline namely he got daptomycin  at [facility] for potential concern of aortitis.  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  Through all this she has had a high ESR.  He had to go to [place] [facility].  He survive this illness.  ) Follow-up in pulmonary clinic.  Has been left with chronic Foley because of enlarged prostate and bladder that wound contract.  He has a history of penile implant.  He is always had indeterminate QuantiFERON gold [reassured by ID] he had decubitus ulcer that finally improved.  He has been on low-dose Eliquis  he also had anemia history [GI did not evaluate because of high risk for endoscopy] but finally improved with iron therapy.  He has history of atrial fibrillation  Because of all this he was significantly physically deconditioned and fatigued.  But through sheer willpower he slowly improved to the point that he was able to now walk independently a  month ago I was able to go to the putting green and stand.  Most recent office visit March 88 and June 88 where he was stable.  He was able to walk 200 feet on room air.  Antifibrotic's cause intense fatigue and has been discussed but not instituted for his ILD because of his significant physical deconditioning and fatigue.  He is now being admitted with penile implant infection and there is need for removal.  Prior to this 89 week his wife informed me that he had pneumonia and was on antibiotics.  And the current chest x-ray this pneumonia is cleared up.  Is currently using 1-2 L at rest [he always uses oxygen  despite the fact that he can go 200 feet without desaturation].  Wife feels there is an increase in oxygen  need  Urology is considering penile implant removal either under spinal anesthesia in 2 days or general anesthesia 7/88/2025 and is requesting pulmonary input for risk assessment.  He looks like recent send baseline according to urology and his wife.  According to nursing he was able to walk 100 feet without any assistance [did desaturate to 83% at 110 feet on room air].  Admission labs revealed mild acute kidney injury 1.3 mg percent.  Pulmonary is asked for preop clearance   Past Medical History:    has a past medical history of AAA (abdominal aortic aneurysm) (HCC), Abnormal EKG, Allergic rhinitis, Allergy, Benign neoplasm of colon (04/14/2010), Carpal tunnel syndrome, Cataract, Decubitus ulcer of coccygeal region, stage 2 (HCC) (07/06/2023), Degenerative disc disease, Disturbances  metabolism of methionine, homocystine, and cystathionine (HCC), Elevated prostate specific antigen (PSA), Elevated PSA (07/06/2023), Hearing loss, Hyperlipidemia, ILD (interstitial lung disease) (HCC) (07/06/2023), Impotence of organic origin, Internal hemorrhoids, Leukocytosis (07/06/2023), Neck pain, Otosclerosis of both ears, Peripheral vascular disease (HCC), Plantar fasciitis, Polymyalgia rheumatica  (HCC), Polymyalgia rheumatica (HCC) (07/06/2023), Rotator cuff syndrome of left shoulder, Scoliosis, and Shoulder pain.   reports that he has never smoked. He has never used smokeless tobacco.  Past Surgical History:  Procedure Laterality Date   ABDOMINAL AORTIC ENDOVASCULAR STENT GRAFT N/A 08/16/2017   Procedure: ABDOMINAL AORTIC ENDOVASCULAR STENT GRAFT insertion;  Surgeon: Serene Gaile ORN, MD;  Location[facility];  Service: Vascular;  Laterality: N/A;   Actinic keratosis removal  06/21/2010   Left shoulder; Dr. Jadine   COLONOSCOPY     COLONOSCOPY W/ POLYPECTOMY     DUPUYTREN CONTRACTURE RELEASE Right 12/05/2013   Procedure: DUPUYTREN CONTRACTURE RELEASE RIGHT LONG, RING AND SMALL FINGERS;  Surgeon: [name]., MD;  Location: [facility];  Service: Orthopedics;  Laterality: Right;   Implant penile pump  2001   IR ANGIOGRAM PELVIS SELECTIVE OR SUPRASELECTIVE  12/11/2020   IR ANGIOGRAM SELECTIVE EACH ADDITIONAL VESSEL  12/11/2020   IR CYSTOSTOMY TUBE PLACEMENT/BLADDER ASPIRATION  06/11/2024   IR EMBO ARTERIAL NOT HEMORR HEMANG INC GUIDE ROADMAPPING  12/11/2020   IR RADIOLOGIST EVAL & MGMT  07/30/2020   IR RADIOLOGIST EVAL & MGMT  08/16/2023   IR US  GUIDE VASC ACCESS RIGHT  12/11/2020   Resection of appendix and tip of rectum  February 2005   STAPEDECTOMY Bilateral 1985, 1988   [employer]    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   Denosumab  Other (See Comments)    PROLIA  - Bad reaction 12/2023- will not receive this again    Rosuvastatin  Other (See Comments)    Stopped taking due to feeling achy     Doxycycline  Other (See Comments)    Upset stomach, currently taking and tolerating well (??)   Morphine  And Codeine Nausea And Vomiting   Valium [Diazepam] Other (See Comments)    Caused TOO MUCH sedation    Immunization History  Administered Date(s) Administered   Dtap, Unspecified 06/29/2010    Influenza, High Dose Seasonal PF 09/01/2017, 08/31/2022   Influenza, Quadrivalent, Recombinant, Inj, Pf 09/01/2018, 08/31/2019, 08/19/2020, 09/24/2021   Influenza,inj,Quad PF,6+ Mos 08/20/2013, 08/26/2014, 09/23/2015, 10/04/2016   Influenza-Unspecified 09/22/2006, 09/26/2007, 10/13/2012   PFIZER(Purple Top)SARS-COV-2 Vaccination 12/10/2019, 12/28/2019, 08/10/2020   Pneumococcal Conjugate-13 05/30/2018   Pneumococcal Polysaccharide-23 07/21/1998, 10/26/2022   Tdap 06/29/2010    Family History  Problem Relation Age of Onset   Heart disease Mother    Emphysema Mother    Leukemia Brother        Chronic lymphocytic leukemia   Colon cancer Neg Hx    Esophageal cancer Neg Hx    Rectal cancer Neg Hx    Stomach cancer Neg Hx      Current Facility-Administered Medications:    0.9 %  sodium chloride  infusion, , Intravenous, Continuous, Trivedi, Hersh M, MD, Last Rate: 50 mL/hr at 06/21/24 2347, New Bag at 06/21/24 2347   acetaminophen  (TYLENOL ) tablet 1,000 mg, 1,000 mg, Oral, Q6H, Trivedi, Hersh M, MD   acyclovir  (ZOVIRAX ) tablet 400 mg, 400 mg, Oral, BID, Lou Claretta HERO, MD, 400 mg at 06/22/24 0959   docusate sodium  (COLACE) capsule 100 mg, 100 mg, Oral, BID, Trivedi, Hersh M, MD, 100 mg  at 06/22/24 1001   feeding supplement (ENSURE PLUS HIGH PROTEIN) liquid 237 mL, 237 mL, Oral, TID BM, Carolee Sherwood BIRCH III, MD, 237 mL at 06/22/24 1009   finasteride  (PROSCAR ) tablet 5 mg, 5 mg, Oral, Daily, Lou Claretta HERO, MD, 5 mg at 06/22/24 1001   fluorometholone  (FML) 0.1 % ophthalmic suspension 1 drop, 1 drop, Right Eye, QHS, Carolee Sherwood BIRCH III, MD, 1 drop at 06/21/24 2355   folic acid  (FOLVITE ) tablet 1 mg, 1 mg, Oral, Daily, Carolee Sherwood BIRCH III, MD, 1 mg at 06/22/24 1002   HYDROcodone -acetaminophen  (NORCO/VICODIN) 5-325 MG per tablet 1 tablet, 1 tablet, Oral, Q6H PRN, Lou Claretta HERO, MD, 1 tablet at 06/21/24 2257   ipratropium-albuterol  (DUONEB) 0.5-2.5 (3) MG/3ML nebulizer solution 3 mL,  3 mL, Nebulization, Q6H PRN, Lou Claretta HERO, MD   LORazepam  (ATIVAN ) tablet 0.25 mg, 0.25 mg, Oral, BID, Carolee Sherwood BIRCH III, MD, 0.25 mg at 06/22/24 1000   LORazepam  (ATIVAN ) tablet 0.5 mg, 0.5 mg, Oral, QHS, Amponsah, Prosper M, MD   melatonin tablet 5 mg, 5 mg, Oral, QHS, Carolee Sherwood BIRCH III, MD, 5 mg at 06/21/24 2257   mirtazapine  (REMERON ) tablet 30 mg, 30 mg, Oral, QHS, Lou Claretta HERO, MD, 30 mg at 06/21/24 2255   multivitamin with minerals tablet 1 tablet, 1 tablet, Oral, Daily, Carolee Sherwood BIRCH III, MD, 1 tablet at 06/22/24 1001   ondansetron  (ZOFRAN ) injection 4 mg, 4 mg, Intravenous, Q4H PRN, Trivedi, Hersh M, MD   pantoprazole  (PROTONIX ) EC tablet 40 mg, 40 mg, Oral, Daily, Carolee Sherwood BIRCH III, MD, 40 mg at 06/22/24 1002   piperacillin -tazobactam (ZOSYN ) IVPB 3.375 g, 3.375 g, Intravenous, Q8H, Trivedi, Hersh M, MD, Last Rate: 12.5 mL/hr at 06/22/24 0603, 3.375 g at 06/22/24 0603   polyethylene glycol (MIRALAX  / GLYCOLAX ) packet 17 g, 17 g, Oral, BID, Amponsah, Prosper M, MD, 17 g at 06/22/24 9041   predniSONE  (DELTASONE ) tablet 10 mg, 10 mg, Oral, Q breakfast, Carolee Sherwood BIRCH III, MD, 10 mg at 06/22/24 9040   predniSONE  (DELTASONE ) tablet 2.5 mg, 2.5 mg, Oral, Q breakfast, Carolee Sherwood BIRCH III, MD, 2.5 mg at 06/22/24 1000   pregabalin  (LYRICA ) capsule 75 mg, 75 mg, Oral, QHS, Lou Claretta HERO, MD, 75 mg at 06/21/24 2257   senna (SENOKOT) tablet 8.6 mg, 1 tablet, Oral, BID, Trivedi, Hersh M, MD, 8.6 mg at 06/22/24 1002   tamsulosin  (FLOMAX ) capsule 0.8 mg, 0.8 mg, Oral, QHS, Lou Claretta HERO, MD, 0.8 mg at 06/21/24 2255   vancomycin  (VANCOCIN ) IVPB 1000 mg/200 mL premix, 1,000 mg, Intravenous, Q24H, Seabron Ronal HERO, RPH   zolpidem  (AMBIEN ) tablet 5 mg, 5 mg, Oral, QHS PRN, Carolee Sherwood BIRCH III, MD, 5 mg at 06/21/24 2304     Significant Hospital Events:  06/21/2024 - admit 06/22/2024: Pulmonary consult     Interim History / Subjective:   06/22/2024 - seen in bed 1401 at  [facility]  Objective   Blood pressure (!) 93/55, pulse 91, temperature 98.6 F (37 C), temperature source Oral, resp. rate 16, height 6' (1.829 m), weight 80.7 kg, SpO2 96%.        Intake/Output Summary (Last 24 hours) at 06/22/2024 1120 Last data filed at 06/22/2024 0603 Gross per 24 hour  Intake 359.36 ml  Output 400 ml  Net -40.64 ml   Filed Weights   06/21/24 1803  Weight: 80.7 kg    Examination: General: Looks the same as I saw him in the office 6 weeks  ago  HENT: Oxygen  on no elevated JVP no neck nodes Lungs: Bilateral lower lobe crackles [appear increased compared to recent baseline] Cardiovascular: Normal heart sounds Abdomen: Soft nontender no organomegaly Extremities: No cyanosis no clubbing no edema Neuro: Alert and oriented x 3 GU: Foley is absent [has suprapubic catheter.  Penis looks quite swollen     1) RISK FOR PROLONGED MECHANICAL VENTILAION - > 48h  1A) Arozullah - Prolonged mech ventilation risk Arozullah Postperative Pulmonary Risk Score - for mech ventilation dependence >48h USAA, Ann [facility]urg 2000, major non-cardiac surgery) Comment Score  Type of surgery - abd ao aneurysm (27), thoracic (21), neurosurgery / upper abdominal / vascular (21), neck (11) Penile implant excision 0  Emergency Surgery - (11) yes 11  ALbumin  < 3 or poor nutritional state - (9) 3 0  BUN > 30 -  (8) 30 8  Partial or completely dependent functional status - (7) Functional bue needs help 7  COPD -  (6) ild 6  Age - 60 to 48 (4), > 70  (6) Age 19 6  TOTAL  40  Risk Stratifcation scores  - < 10 (0.5%), 11-19 (1.8%), 20-27 (4.2%), 28-40 (10.1%), >40 (26.6%) 10-20% risk High moderate      1B) GUPTA - Prolonged Mech Vent Risk Score source Risk  Guptal post op prolonged mech ventilation > 48h or reintubation < 30 days - ACS 2007-2008 dataset - [email or web address] 22% risk      R3) ISK FOR ANY POST-OP PULMONARY  COMPLICATION Score source Risk  CANET/ARISCAT Score - risk for ANY/ALl pulmonary complications - > risk of in-hospital post-op pulmonary complications (composite including respiratory failure, respiratory infection, pleural effusion, atelectasis, pneumothorax, bronchospasm, aspiration pneumonitis) [employer] - based on age, anemia, pulse ox, resp infection prior 30d, incision site, duration of surgery, and emergency v elective surgery 40%    Assessment & Plan:  ASSESSMENT / PLAN:  PULMONARY  A:  Chronic interstitial lung disease not otherwise specified [as always had pre-ILD] but in the last 1 year after acute respiratory failure following daptomycin  pneumonitis he developed definite ILD.  Not able to do antifibrotic's because of severe fatigue although he was getting close to institution as he was improving  06/22/2024 -> concerned this might be worse because his exertional distance to desaturation appears worsened his crackles appear somewhat worse   P:   Get high-resolution CT chest Oxygen  for pulse ox goal greater than 90% - He should never get daptomycin  -Will consider antifibrotic's as a outpatient if he is progressing       History of submassive PE in April 2025 -since then on low-dose Eliquis  because of anemia and concern for GI bleed    P:  Eliquis  can be held prior to surgery Will get echocardiogram Check BNP and check troponin   Recent pneumonia not otherwise specified completed azithromycin  prior to admission Current penile implant infection requires removal   P:   Anti-infectives (From admission, onward)    Start     Dose/Rate Route Frequency Ordered Stop   06/22/24 2000  vancomycin  (VANCOCIN ) IVPB 1000 mg/200 mL premix        1,000 mg 200 mL/hr over 60 Minutes Intravenous Every 24 hours 06/21/24 1852     06/21/24 2315  acyclovir  (ZOVIRAX ) tablet 400 mg        400 mg Oral 2 times daily 06/21/24  2225     06/21/24 2000  piperacillin -tazobactam (ZOSYN ) IVPB 3.375 g        3.375  g 12.5 mL/hr over 240 Minutes Intravenous Every 8 hours 06/21/24 1754 06/28/24 2159   06/21/24 1900  vancomycin  (VANCOREADY) IVPB 1500 mg/300 mL        1,500 mg 150 mL/hr over 120 Minutes Intravenous  Once 06/21/24 1809 06/21/24 2250   06/21/24 1845  vancomycin  (VANCOREADY) IVPB 750 mg/150 mL  Status:  Discontinued        750 mg 150 mL/hr over 60 Minutes Intravenous Every 12 hours 06/21/24 1754 06/21/24 1809        New onset acute kidney injury likely due to sepsis [discussed Dr. Bell]  06/22/2024 -hospitalist hydrating  P:  Check repeat creatinine this evening    Chronic anemia   P:  - PRBC for hgb </= 6.9gm%    - exceptions are   -  if ACS susepcted/confirmed then transfuse for hgb </= 8.0gm%,  or    -  active bleeding with hemodynamic instability, then transfuse regardless of hemoglobin value   At at all times try to transfuse 1 unit prbc as possible with exception of active hemorrhage    Chronic steroids for many decades and chronic methotrexate since 2025   P:   Likely require stress dose steroids if going for surgery  MSK/DERM Physical deconditioning -he has improved significantly in the last 1 year due to show will follow an exercise  Plan  - Continue physical exercise to the extent possible   Preoperative risk exam pulmonary   -He is moderate high risk for prolonged ventilator dependence after surgery [greater than 20%] he is definitely at high risk for various complication such as pneumonia atelectasis worsening hypoxemia which is higher than his risk for prolonged ventilator dependence.  Most of his risk comes from the fact he is age greater than 38 and has underlying physical deconditioning although he is improved and his renal function status and only to some extent his ILD.  -However continued sepsis and risk of current kidney injury all have a potential to worsen his  pulmonary fibrosis and flared this up especially to acute lung injury.  Therefore sepsis source removal takes high precedence.   His risk ameliorating factors are the fact that he is able to walk 100 feet without desaturating and is got decent physical tone  Plan - Like to tune him up -improving creatinine was the single most important thing to do right now - Do other risk assessment in terms of checking his BNP, troponin, lactic acid - Timing of surgery is judgment call-based on risk of sepsis worsening and need for source control - Postoperatively he is probably better of being monitored in the stepdown unit  Best practice (daily eval):  Per TRiad    Discussed with hospitalist and neurologist Dr. Carolee  Family Updates: Wife has been updated over the phone     SIGNATURE    Dr. Dorethia Cave, M.D., F.C.C.P,  Pulmonary and Critical Care Medicine Staff Physician, [facility] Director - Interstitial Lung Disease  Program  Pulmonary Fibrosis [facility] at [facility] [place], [place], [address]  NPI Number:  NPI #[id number]  Pager: [phone], If no answer  -> Check AMION or Try [phone] Telephone (clinical office): [phone] Telephone (research): [phone]  11:20 AM 06/22/2024   06/22/2024 11:20 AM    LABS    PULMONARY No results for input(s): PHART, PCO2ART, PO2ART, HCO3, TCO2, O2SAT in the last 168 hours.  Invalid input(s): PCO2, PO2  CBC Recent Labs  Lab 06/21/24 2104 06/22/24 0522  HGB 10.4* 9.9*  HCT 32.8* 31.9*  WBC  --  20.2*  PLT  --  303    COAGULATION Recent Labs  Lab 06/21/24 2104  INR 1.1    CARDIAC  No results for input(s): TROPONINI in the last 168 hours. No results for input(s): PROBNP in the last 168 hours.  CHEMISTRY Recent Labs  Lab 06/21/24 2104 06/22/24 0522  NA 137 140  K 4.7 4.3  CL 103 107  CO2 26 25  GLUCOSE 158* 107*  BUN 28* 30*  CREATININE  1.12 1.37*  CALCIUM  8.4* 7.9*   Estimated Creatinine Clearance: 38.5 mL/min (A) (by C-G formula based on SCr of 1.37 mg/dL (H)).   LIVER Recent Labs  Lab 06/21/24 2104  AST 22  ALT 27  ALKPHOS 70  BILITOT 0.4  PROT 6.4*  ALBUMIN  3.0*  INR 1.1     INFECTIOUS No results for input(s): LATICACIDVEN, PROCALCITON in the last 168 hours.   ENDOCRINE CBG (last 3)  No results for input(s): GLUCAP in the last 72 hours.       IMAGING x48h  - image(s) personally visualized  -   highlighted in bold Portable chest 1 View Result Date: 06/21/2024 CLINICAL DATA:  Cough EXAM: PORTABLE CHEST 1 VIEW COMPARISON:  Chest x-ray 12/29/2023.  Chest CT 01/08/2024. FINDINGS: There some peripheral interstitial opacities in the lung bases as seen on prior CT. There is no focal lung infiltrate, pleural effusion or pneumothorax. Heart is mildly enlarged. Aorta is tortuous. No acute fractures are seen. IMPRESSION: 1. No active disease. 2. Mild cardiomegaly. Electronically Signed   By: Greig Pique M.D.   On: 06/21/2024 19:16

## 2024-06-22 NOTE — Progress Notes (Signed)
 At 20:34 a call was place to urology attending for this patient regarding the medication Flomax  0.8 mg that has fallen off the [facility] due to the med been expired. The on call personnel stated will notify the MD about the med. At this time 23:23 we are still waiting for the doctor to reorder the medication.

## 2024-06-22 NOTE — Progress Notes (Signed)
 Progress Note  Chief Complaint: infected penile prosthesis  History of Present Illness: [name] is a 89 y.o. 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   Here he is afebrile, blood pressure 94/57, normal rate, on 2L [place]. Labs pending, CXR pending.   Interval 06/22/24: Remains afebrile and hemodynamically stable, leukocytosis to 20, hgb 9,9, Cr 1.37 from 1.12, urine culture pending, wound swab pending.   Past Medical History:  Diagnosis Date   AAA (abdominal aortic aneurysm) (HCC)    Abnormal EKG    Left atrial abnormality   Allergic rhinitis    Allergy    Benign neoplasm of colon 04/14/2010   3 small polyps on colonoscopy by Dr. [name]   Carpal tunnel syndrome    Cataract    Decubitus ulcer of coccygeal region, stage 2 (HCC) 07/06/2023   Degenerative disc disease    Disturbances metabolism of methionine, homocystine, and cystathionine (HCC)    Elevated homocysteine   Elevated prostate specific antigen (PSA)    Elevated PSA 07/06/2023   Hearing loss    Hyperlipidemia    ILD (interstitial lung disease) (HCC) 07/06/2023   Impotence of organic origin    Penile implant   Internal hemorrhoids    Leukocytosis 07/06/2023   Neck pain    Otosclerosis of both ears    Peripheral vascular disease (HCC)    Bilateral femoral bruit   Plantar fasciitis    Polymyalgia rheumatica (HCC)    Polymyalgia rheumatica (HCC) 07/06/2023   Rotator cuff syndrome of left shoulder    Scoliosis    Shoulder pain     Past Surgical History:  Procedure Laterality Date   ABDOMINAL AORTIC ENDOVASCULAR STENT GRAFT N/A  08/16/2017   Procedure: ABDOMINAL AORTIC ENDOVASCULAR STENT GRAFT insertion;  Surgeon: Serene Gaile ORN, MD;  Location: [facility] OR;  Service: Vascular;  Laterality: N/A;   Actinic keratosis removal  06/21/2010   Left shoulder; Dr. Jadine   COLONOSCOPY     COLONOSCOPY W/ POLYPECTOMY     DUPUYTREN CONTRACTURE RELEASE Right 12/05/2013   Procedure: DUPUYTREN CONTRACTURE RELEASE RIGHT LONG, RING AND SMALL FINGERS;  Surgeon: [name]., MD;  Location: [facility];  Service: Orthopedics;  Laterality: Right;   Implant penile pump  2001   IR ANGIOGRAM PELVIS SELECTIVE OR SUPRASELECTIVE  12/11/2020   IR ANGIOGRAM SELECTIVE EACH ADDITIONAL VESSEL  12/11/2020   IR CYSTOSTOMY TUBE PLACEMENT/BLADDER ASPIRATION  06/11/2024   IR EMBO ARTERIAL NOT HEMORR HEMANG INC GUIDE ROADMAPPING  12/11/2020   IR RADIOLOGIST EVAL & MGMT  07/30/2020   IR RADIOLOGIST EVAL & MGMT  08/16/2023   IR US  GUIDE VASC ACCESS RIGHT  12/11/2020   Resection of appendix and tip of rectum  February 2005   STAPEDECTOMY Bilateral 1985, 1988   [facility]    Home Medications:  No current facility-administered medications on file prior to encounter.   Current Outpatient Medications on File Prior to Encounter  Medication Sig Dispense Refill   Acetaminophen  500 MG capsule Take 500-1,000 mg by mouth See admin instructions. Tylenol  Rapid Release 500 mg capsules- Take 500-1,000 mg by mouth every eight hours as needed for pain  acyclovir  (ZOVIRAX ) 400 MG tablet Take 400 mg by mouth 2 (two) times daily.     butalbital -acetaminophen -caffeine  (FIORICET ) 50-325-40 MG tablet Take 1 tablet by mouth daily as needed for headache. 14 tablet 0   cefdinir (OMNICEF) 300 MG capsule Take 300 mg by mouth 2 (two) times daily.     cholecalciferol (VITAMIN D3) 25 MCG (1000 UNIT) tablet Take 2,000 Units by mouth daily.     dicyclomine  (BENTYL ) 10 MG/5ML solution Take 20 mg by mouth 3 (three) times daily as needed (for cramping or abdominal  pain).     docusate sodium  (COLACE) 100 MG capsule Take 100 mg by mouth at bedtime.     finasteride  (PROSCAR ) 5 MG tablet Take 5 mg by mouth daily.     fluorometholone  (FML) 0.1 % ophthalmic suspension Place 1 drop into the right eye at bedtime.      folic acid  (FOLVITE ) 1 MG tablet Take 1 mg by mouth daily.     HYDROcodone -acetaminophen  (NORCO/VICODIN) 5-325 MG tablet Take 1 tablet by mouth every 6 (six) hours as needed for moderate pain (pain score 4-6).     lansoprazole (PREVACID) 30 MG capsule Take 30 mg by mouth 2 (two) times daily before a meal.     lidocaine  (LIDODERM ) 5 % Place 1 patch onto the skin daily as needed (for pain- Remove & Discard patch within 12 hours or as directed by MD).     LORazepam  (ATIVAN ) 0.5 MG tablet Take 0.5 mg by mouth See admin instructions. Take 0.5 mg by mouth at bedtime and an additional 0.5 mg up to twice a day as needed for anxiety     melatonin 5 MG TABS Take 1 tablet (5 mg total) by mouth at bedtime.     methotrexate (RHEUMATREX) 2.5 MG tablet Take 12.5 mg by mouth every Friday.     mirtazapine  (REMERON ) 15 MG tablet Take 30 mg by mouth at bedtime.     Multiple Vitamins-Minerals (MULTIVITAMIN PO) Take 1 tablet by mouth daily.      polyethylene glycol (MIRALAX  / GLYCOLAX ) 17 g packet Take 17 g by mouth in the morning and at bedtime.     predniSONE  (DELTASONE ) 10 MG tablet Take 10 mg by mouth daily with breakfast.     predniSONE  (DELTASONE ) 2.5 MG tablet Take 2.5 mg by mouth daily with breakfast.     pregabalin  (LYRICA ) 75 MG capsule Take 75 mg by mouth at bedtime.     senna-docusate (SENNA-S) 8.6-50 MG tablet Take 1 tablet by mouth in the morning.     tamsulosin  (FLOMAX ) 0.4 MG CAPS capsule Take 0.8 mg by mouth at bedtime.     zolpidem  (AMBIEN ) 5 MG tablet Take 5 mg by mouth at bedtime.     acetaminophen  (TYLENOL ) 325 MG tablet Take 2 tablets (650 mg total) by mouth every 6 (six) hours as needed for mild pain (or Fever >/= 101). (Patient not taking:  Reported on 06/21/2024)     apixaban  (ELIQUIS ) 2.5 MG TABS tablet Take 2.5 mg by mouth 2 (two) times daily. (Patient not taking: Reported on 06/21/2024)     diclofenac  Sodium (VOLTAREN ) 1 % GEL Apply 2 g topically 4 (four) times daily as needed (shoulder pain). (Patient not taking: Reported on 06/21/2024) 2 g 0   feeding supplement (ENSURE ENLIVE / ENSURE PLUS) LIQD Take 237 mLs by mouth 3 (three) times daily between meals. 237 mL 12   ondansetron  (ZOFRAN ) 4 MG tablet Take 1 tablet (4 mg total) by mouth  every 4 (four) hours as needed for nausea or vomiting. (Patient not taking: Reported on 06/21/2024) 100 tablet 6   pantoprazole  (PROTONIX ) 40 MG tablet Take 1 tablet (40 mg total) by mouth daily before breakfast. (Patient not taking: Reported on 06/21/2024) 30 tablet 3   promethazine  (PHENERGAN ) 12.5 MG tablet Take 1 tablet (12.5 mg total) by mouth daily as needed for nausea or vomiting. (Patient not taking: Reported on 06/21/2024) 30 tablet 6     Allergies:  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   Denosumab  Other (See Comments)    PROLIA  - Bad reaction 12/2023- will not receive this again    Rosuvastatin  Other (See Comments)    Stopped taking due to feeling achy     Doxycycline  Other (See Comments)    Upset stomach, currently taking and tolerating well (??)   Morphine  And Codeine Nausea And Vomiting   Valium [Diazepam] Other (See Comments)    Caused TOO MUCH sedation    Family History  Problem Relation Age of Onset   Heart disease Mother    Emphysema Mother    Leukemia Brother        Chronic lymphocytic leukemia   Colon cancer Neg Hx    Esophageal cancer Neg Hx    Rectal cancer Neg Hx    Stomach cancer Neg Hx     Social History:  reports that he has never smoked. He has never used smokeless tobacco. He reports that he does not drink alcohol  and does not use drugs.  ROS: A complete review of systems was performed.  All  systems are negative except for pertinent findings as noted.  Physical Exam:  Vital signs in last 24 hours: Temp:  [98.1 F (36.7 C)-98.6 F (37 C)] 98.6 F (37 C) (07/26 0211) Pulse Rate:  [76-99] 99 (07/26 0211) Resp:  [16-17] 16 (07/26 0211) BP: (93-120)/(55-68) 93/55 (07/26 0211) SpO2:  [100 %] 100 % (07/26 0211) Weight:  [80.7 kg] 80.7 kg (07/25 1803) Constitutional:  Alert and oriented, No acute distress Cardiovascular: Regular rate and rhythm, No JVD Respiratory: Normal respiratory effort, Lungs clear bilaterally GI: Abdomen is soft, nontender, nondistended, no abdominal masses GU: No CVA tenderness, pus from urethral meatus and generalized edema, warm, erythematous, edematous penile shaft and scrotum Lymphatic: No lymphadenopathy Neurologic: Grossly intact, no focal deficits Psychiatric: Normal mood and affect   Laboratory Data:  Recent Labs    06/21/24 2104 06/22/24 0522  WBC  --  20.2*  HGB 10.4* 9.9*  HCT 32.8* 31.9*  PLT  --  303    Recent Labs    06/21/24 2104 06/22/24 0522  NA 137 140  K 4.7 4.3  CL 103 107  GLUCOSE 158* 107*  BUN 28* 30*  CALCIUM  8.4* 7.9*  CREATININE 1.12 1.37*     Results for orders placed or performed during the hospital encounter of 06/21/24 (from the past 24 hours)  Type and screen     Status: None   Collection Time: 06/21/24  9:03 PM  Result Value Ref Range   ABO/RH(D) O POS    Antibody Screen NEG    Sample Expiration      06/24/2024,2359 Performed at [facility], [address], [place], [place] [address]   Comprehensive metabolic panel with GFR     Status: Abnormal   Collection Time: 06/21/24  9:04 PM  Result Value Ref Range   Sodium 137 135 - 145 mmol/L  Potassium 4.7 3.5 - 5.1 mmol/L   Chloride 103 98 - 111 mmol/L   CO2 26 22 - 32 mmol/L   Glucose, Bld 158 (H) 70 - 99 mg/dL   BUN 28 (H) 8 - 23 mg/dL   Creatinine, Ser 8.87 0.61 - 1.24 mg/dL   Calcium  8.4 (L) 8.9 - 10.3 mg/dL   Total  Protein 6.4 (L) 6.5 - 8.1 g/dL   Albumin  3.0 (L) 3.5 - 5.0 g/dL   AST 22 15 - 41 U/L   ALT 27 0 - 44 U/L   Alkaline Phosphatase 70 38 - 126 U/L   Total Bilirubin 0.4 0.0 - 1.2 mg/dL   GFR, Estimated >39 >39 mL/min   Anion gap 8 5 - 15  Hemoglobin and hematocrit, blood     Status: Abnormal   Collection Time: 06/21/24  9:04 PM  Result Value Ref Range   Hemoglobin 10.4 (L) 13.0 - 17.0 g/dL   HCT 67.1 (L) 60.9 - 47.9 %  Protime-INR     Status: Abnormal   Collection Time: 06/21/24  9:04 PM  Result Value Ref Range   Prothrombin Time 15.3 (H) 11.4 - 15.2 seconds   INR 1.1 0.8 - 1.2  APTT     Status: None   Collection Time: 06/21/24  9:04 PM  Result Value Ref Range   aPTT 31 24 - 36 seconds  Urinalysis, Complete w Microscopic -Urine, Clean Catch     Status: Abnormal   Collection Time: 06/22/24  1:49 AM  Result Value Ref Range   Color, Urine YELLOW YELLOW   APPearance CLEAR CLEAR   Specific Gravity, Urine 1.012 1.005 - 1.030   pH 5.0 5.0 - 8.0   Glucose, UA NEGATIVE NEGATIVE mg/dL   Hgb urine dipstick MODERATE (A) NEGATIVE   Bilirubin Urine NEGATIVE NEGATIVE   Ketones, ur NEGATIVE NEGATIVE mg/dL   Protein, ur NEGATIVE NEGATIVE mg/dL   Nitrite NEGATIVE NEGATIVE   Leukocytes,Ua SMALL (A) NEGATIVE   RBC / HPF 0-5 0 - 5 RBC/hpf   WBC, UA 6-10 0 - 5 WBC/hpf   Bacteria, UA RARE (A) NONE SEEN   Squamous Epithelial / HPF 0-5 0 - 5 /HPF   Mucus PRESENT   Basic metabolic panel     Status: Abnormal   Collection Time: 06/22/24  5:22 AM  Result Value Ref Range   Sodium 140 135 - 145 mmol/L   Potassium 4.3 3.5 - 5.1 mmol/L   Chloride 107 98 - 111 mmol/L   CO2 25 22 - 32 mmol/L   Glucose, Bld 107 (H) 70 - 99 mg/dL   BUN 30 (H) 8 - 23 mg/dL   Creatinine, Ser 8.62 (H) 0.61 - 1.24 mg/dL   Calcium  7.9 (L) 8.9 - 10.3 mg/dL   GFR, Estimated 49 (L) >60 mL/min   Anion gap 8 5 - 15  CBC     Status: Abnormal   Collection Time: 06/22/24  5:22 AM  Result Value Ref Range   WBC 20.2 (H) 4.0 - 10.5  K/uL   RBC 3.01 (L) 4.22 - 5.81 MIL/uL   Hemoglobin 9.9 (L) 13.0 - 17.0 g/dL   HCT 68.0 (L) 60.9 - 47.9 %   MCV 106.0 (H) 80.0 - 100.0 fL   MCH 32.9 26.0 - 34.0 pg   MCHC 31.0 30.0 - 36.0 g/dL   RDW 81.3 (H) 88.4 - 84.4 %   Platelets 303 150 - 400 K/uL   nRBC 0.0 0.0 - 0.2 %   No  results found for this or any previous visit (from the past 240 hours).  Renal Function: Recent Labs    06/21/24 2104 06/22/24 0522  CREATININE 1.12 1.37*   Estimated Creatinine Clearance: 38.5 mL/min (A) (by C-G formula based on SCr of 1.37 mg/dL (H)).  Radiologic Imaging: Portable chest 1 View Result Date: 06/21/2024 CLINICAL DATA:  Cough EXAM: PORTABLE CHEST 1 VIEW COMPARISON:  Chest x-ray 12/29/2023.  Chest CT 01/08/2024. FINDINGS: There some peripheral interstitial opacities in the lung bases as seen on prior CT. There is no focal lung infiltrate, pleural effusion or pneumothorax. Heart is mildly enlarged. Aorta is tortuous. No acute fractures are seen. IMPRESSION: 1. No active disease. 2. Mild cardiomegaly. Electronically Signed   By: Greig Pique M.D.   On: 06/21/2024 19:16    Impression/Assessment:  89yo hx above who presents with c/f penile prosthesis infection. Penis and implant are warm to the touch, purulence is expressible from the meatus, no tenderness. Last Eliquis  dose last night. Will need implant removed. Was being treated for pneumonia, currently on Omnicef, home pulmonologist aware of admission.   Plan:  -Appreciate hospitalist assistance  -home meds and PNA care per their recs -Regular diet today and NPO @ [facility] for OR tomorrow  -Vanc/Zosyn  for antibiotics -Continue labs -CXR now -If patient fevers, blood cultures and will consider expedited OR   Jacqulyn CHRISTELLA Bound 06/22/2024, 6:47 AM

## 2024-06-22 NOTE — Consult Note (Signed)
 WOC Nurse Consult Note: patient with history of Stage 3 Pressure Injury to buttocks/sacrum; [facility], using calcium  alginate with silver  at that time; wife requested consult for possible debridement  Reason for Consult: pressure injury buttocks  Wound type: STage 3 Pressure Injury buttocks  Pressure Injury POA: Yes Measurement: 2 separate areas with intact skin in between left upper buttock 1 cm x 2 cm x 0.1 cm, R upper buttock 2 cm x 2 cm x 0.1 cm  Wound bed: 100% red clean Drainage (amount, consistency, odor) serosanguinous  Periwound: erythema and peeling skin (wife says from Tegaderm being placed and pulled off by home staff)  Dressing procedure/placement/frequency: Cleanse  sacrum/upper buttocks wound with NS, apply silver  hydfofiber (Aquacel ANDRIA Collum #[id number]) cut to fit wound beds daily.  Apply Army 's Butt Cream to surrounding skin.    POC discussed with bedside nurse.  WIfe may use product from home if she prefers (calcium  alginate with silver ).  This wound is completely clean and does not require debridement of any kind. Would not pull or trim the sloughing skin around wound.    WOC team will not follow.  Please reconsult if further needs arise.   Thank you,    Nakema Fake MSN, RN-BC, CWOCN :

## 2024-06-22 NOTE — Plan of Care (Signed)
  Problem: Education: Goal: Knowledge of General Education information will improve Description: Including pain rating scale, medication(s)/side effects and non-pharmacologic comfort measures Outcome: Progressing   Problem: Health Behavior/Discharge Planning: Goal: Ability to manage health-related needs will improve Outcome: Progressing   Problem: Activity: Goal: Risk for activity intolerance will decrease Outcome: Progressing   Problem: Coping: Goal: Level of anxiety will decrease Outcome: Progressing   Problem: Safety: Goal: Ability to remain free from injury will improve Outcome: Progressing   Problem: Skin Integrity: Goal: Risk for impaired skin integrity will decrease Outcome: Progressing

## 2024-06-22 NOTE — Progress Notes (Signed)
 Dr Carolee answered the message and he did stated that Flomax  0.8 mg he manually discontinued the med because it is contraindicated at the moment due to low blood pressure. Will notify patient's wife.

## 2024-06-22 NOTE — Plan of Care (Signed)

## 2024-06-22 NOTE — Progress Notes (Signed)
 Progress Note   Patient: [name] FMW:[id number] DOB: [birth year] DOA: 06/21/2024     1 DOS: the patient was seen and examined on 06/22/2024     Brief hospital course: [name] is a 89 year old with PMH of BPH with LUTS, urinary retention s/p suprapubic catheter, HLD, HTN, interstitial lung disease, anxiety and depression, polymyalgia rheumatica, paroxysmal A-fib on Eliquis , PE/DVT, AAA s/p stent graft in 2018, chronic diastolic HF, IDA and a recently diagnosed pneumonia who was directly admitted by urology for infected penile prosthesis.  TRH consulted to manage medical problems.   Assessment and Plan:   # Acute on chronic hypoxic respiratory failure # Interstitial lung disease # Community-acquired pneumonia - Diagnosed with LLL PNA by PCP on 7/21 and started on Augmentin and azithromycin  Uses 2 L of oxygen  at home - Repeat CXR shows resolution of the focal lung infiltrate Continue IV vancomycin  and Zosyn  - As needed DuoNebs - Incentive spirometer, flutter valve  Acute kidney injury Patient with slight increase in creatinine today from his baseline We will continue current IV fluid Monitor renal function  # Paroxysmal A-fib - Rate controlled, HR in the 80s - EKG shows sinus tach - Last Eliquis  dose on p.m. of 7/24, continue to hold pending surgery    # Polymyalgia rheumatica # Right shoulder pain - Follows with [facility], on prednisone  12.5 mg daily and methotrexate 12.5 mg weekly - Hold methotrexate in the setting of acute infection - On >10 mg/day of steroids, stress dose steroids not indicated at the moment - Continue prednisone  and Lyrica    # IDA - Hgb of 10.4, baseline Hgb of 11-12 - Trend CBC   # Hx of DVT/PE - Hold Eliquis    # Anxiety and depression - Continue mirtazapine  and lorazepam    # Insomnia - Continue melatonin and as needed zolpidem    # Hx of herpes ophthalmicus - Continue acyclovir  to prevent herpes flare   # AAA/ s/p Repair -  Appears stable on recent CT   # GERD - Continue PPI   # BPH # Urinary retention s/p suprapubic catheter - Continue finasteride  and tamsulosin  - Foley management per primary team   # Infected penile implant - Management per primary team Urology planning surgery tomorrow    Family Communication: Discussed plan with spouse and caretaker at bedside     TRH will continue to follow the patient.   Subjective:  Patient seen and evaluated at bedside today Continues to be on 2 L of intranasal oxygen  which he uses at home Renal function as well as leukocytosis slightly worsened today He denies any acute overnight event Patient being planned for the OR tomorrow by urology team   Physical Exam: General: Pleasant, laying in bed in no acute distress on intranasal oxygen  HEENT: [place]/AT. Anicteric sclera CV: RRR. No murmurs, rubs, or gallops. No LE edema Pulmonary: On 2LNC. Lungs CTAB. Normal effort. Fine crackles. No wheezing or rhonchi. Abdominal: Soft, nontender, nondistended. Normal bowel sounds. Extremities: Palpable radial and DP pulses. Normal ROM. Skin: Warm and dry. No obvious rash or lesions. GU: Suprapubic catheter stable in place with clear yellowish urine in bag Neuro: A&Ox3. Moves all extremities. Normal sensation to light touch. No focal deficit. Psych: Normal mood and affect  Data Reviewed:     Latest Ref Rng & Units 06/22/2024    5:22 AM 06/21/2024    9:04 PM 06/11/2024    8:53 AM  CBC  WBC 4.0 - 10.5 K/uL 20.2   14.6  Hemoglobin 13.0 - 17.0 g/dL 9.9  89.5  88.6   Hematocrit 39.0 - 52.0 % 31.9  32.8  34.9   Platelets 150 - 400 K/uL 303   338        Latest Ref Rng & Units 06/22/2024    5:22 AM 06/21/2024    9:04 PM 06/11/2024    8:53 AM  BMP  Glucose 70 - 99 mg/dL 892  841  97   BUN 8 - 23 mg/dL 30  28  31    Creatinine 0.61 - 1.24 mg/dL 8.62  8.87  8.75   Sodium 135 - 145 mmol/L 140  137  140   Potassium 3.5 - 5.1 mmol/L 4.3  4.7  4.0   Chloride 98 - 111 mmol/L  107  103  103   CO2 22 - 32 mmol/L 25  26  28    Calcium  8.9 - 10.3 mg/dL 7.9  8.4  9.1      Vitals:   06/22/24 0211 06/22/24 1044 06/22/24 1047 06/22/24 1048  BP: (!) 93/55     Pulse: 99 85 88 91  Resp: 16     Temp: 98.6 F (37 C)     TempSrc: Oral     SpO2: 100% 95% (!) 83% 96%  Weight:      Height:         Author: Drue ONEIDA Potter, MD 06/22/2024 12:21 PM  For on call review [email or web address].

## 2024-06-23 ENCOUNTER — Encounter (HOSPITAL_COMMUNITY): Payer: Self-pay | Admitting: Anesthesiology

## 2024-06-23 ENCOUNTER — Inpatient Hospital Stay (HOSPITAL_COMMUNITY)

## 2024-06-23 ENCOUNTER — Encounter (HOSPITAL_COMMUNITY)
Disposition: A | Discharge status: Home / Self Care | Payer: Self-pay | Source: Home / Self Care | Attending: Internal Medicine

## 2024-06-23 ENCOUNTER — Inpatient Hospital Stay (HOSPITAL_COMMUNITY): Payer: Self-pay | Admitting: Anesthesiology

## 2024-06-23 DIAGNOSIS — N179 Acute kidney failure, unspecified: Secondary | ICD-10-CM | POA: Diagnosis not present

## 2024-06-23 DIAGNOSIS — T8361XA Infection and inflammatory reaction due to implanted penile prosthesis, initial encounter: Secondary | ICD-10-CM | POA: Diagnosis not present

## 2024-06-23 DIAGNOSIS — R0603 Acute respiratory distress: Secondary | ICD-10-CM

## 2024-06-23 DIAGNOSIS — I480 Paroxysmal atrial fibrillation: Secondary | ICD-10-CM | POA: Diagnosis not present

## 2024-06-23 DIAGNOSIS — E785 Hyperlipidemia, unspecified: Secondary | ICD-10-CM

## 2024-06-23 DIAGNOSIS — I5032 Chronic diastolic (congestive) heart failure: Secondary | ICD-10-CM

## 2024-06-23 DIAGNOSIS — T8361XD Infection and inflammatory reaction due to implanted penile prosthesis, subsequent encounter: Secondary | ICD-10-CM | POA: Diagnosis not present

## 2024-06-23 DIAGNOSIS — Z01811 Encounter for preprocedural respiratory examination: Secondary | ICD-10-CM | POA: Diagnosis not present

## 2024-06-23 DIAGNOSIS — J849 Interstitial pulmonary disease, unspecified: Secondary | ICD-10-CM | POA: Diagnosis not present

## 2024-06-23 HISTORY — PX: REMOVAL OF PENILE PROSTHESIS: SHX6059

## 2024-06-23 HISTORY — PX: CYSTOSCOPY: SHX5120

## 2024-06-23 HISTORY — PX: SCROTAL EXPLORATION: SHX2386

## 2024-06-23 LAB — BASIC METABOLIC PANEL WITH GFR
Anion gap: 6 (ref 5–15)
BUN: 29 mg/dL — ABNORMAL HIGH (ref 8–23)
CO2: 26 mmol/L (ref 22–32)
Calcium: 7.6 mg/dL — ABNORMAL LOW (ref 8.9–10.3)
Chloride: 106 mmol/L (ref 98–111)
Creatinine, Ser: 1.25 mg/dL — ABNORMAL HIGH (ref 0.61–1.24)
GFR, Estimated: 54 mL/min — ABNORMAL LOW (ref >60)
Glucose, Bld: 112 mg/dL — ABNORMAL HIGH (ref 70–99)
Potassium: 4 mmol/L (ref 3.5–5.1)
Sodium: 138 mmol/L (ref 135–145)

## 2024-06-23 LAB — ECHOCARDIOGRAM COMPLETE
AR max vel: 1.63 cm2
AV Area VTI: 1.61 cm2
AV Area mean vel: 1.58 cm2
AV Mean grad: 8 mmHg
AV Peak grad: 15 mmHg
Ao pk vel: 1.94 m/s
Area-P 1/2: 3.99 cm2
Height: 72 in
S' Lateral: 2.8 cm
Weight: 2955.93 [oz_av]

## 2024-06-23 LAB — CBC
HCT: 30.6 % — ABNORMAL LOW (ref 39.0–52.0)
Hemoglobin: 9.7 g/dL — ABNORMAL LOW (ref 13.0–17.0)
MCH: 33.6 pg (ref 26.0–34.0)
MCHC: 31.7 g/dL (ref 30.0–36.0)
MCV: 105.9 fL — ABNORMAL HIGH (ref 80.0–100.0)
Platelets: 293 K/uL (ref 150–400)
RBC: 2.89 MIL/uL — ABNORMAL LOW (ref 4.22–5.81)
RDW: 18.3 % — ABNORMAL HIGH (ref 11.5–15.5)
WBC: 13.4 K/uL — ABNORMAL HIGH (ref 4.0–10.5)
nRBC: 0 % (ref 0.0–0.2)

## 2024-06-23 LAB — PROCALCITONIN: Procalcitonin: 0.77 ng/mL

## 2024-06-23 LAB — MAGNESIUM: Magnesium: 2.6 mg/dL — ABNORMAL HIGH (ref 1.7–2.4)

## 2024-06-23 LAB — MRSA NEXT GEN BY PCR, NASAL: MRSA by PCR Next Gen: NOT DETECTED

## 2024-06-23 LAB — PHOSPHORUS: Phosphorus: 3 mg/dL (ref 2.5–4.6)

## 2024-06-23 LAB — TROPONIN I (HIGH SENSITIVITY): Troponin I (High Sensitivity): 23 ng/L — ABNORMAL HIGH (ref <18)

## 2024-06-23 SURGERY — EXPLORATION, SCROTUM
Anesthesia: General | Site: Scrotum

## 2024-06-23 MED ORDER — SODIUM CHLORIDE 0.9 % IV BOLUS
500.0000 mL | Freq: Once | INTRAVENOUS | Status: AC
  Administered 2024-06-23: 500 mL via INTRAVENOUS

## 2024-06-23 MED ORDER — FENTANYL CITRATE (PF) 100 MCG/2ML IJ SOLN
INTRAMUSCULAR | Status: AC
Start: 2024-06-23 — End: 2024-06-23
  Filled 2024-06-23: qty 2

## 2024-06-23 MED ORDER — ONDANSETRON HCL 4 MG/2ML IJ SOLN
INTRAMUSCULAR | Status: AC
  Filled 2024-06-23: qty 2

## 2024-06-23 MED ORDER — FENTANYL CITRATE (PF) 100 MCG/2ML IJ SOLN
INTRAMUSCULAR | Status: AC
  Filled 2024-06-23: qty 2

## 2024-06-23 MED ORDER — DEXAMETHASONE SODIUM PHOSPHATE 10 MG/ML IJ SOLN
INTRAMUSCULAR | Status: AC
  Filled 2024-06-23: qty 1

## 2024-06-23 MED ORDER — BUPIVACAINE HCL (PF) 0.25 % IJ SOLN
INTRAMUSCULAR | Status: AC
  Filled 2024-06-23: qty 30

## 2024-06-23 MED ORDER — PROPOFOL 10 MG/ML IV BOLUS
INTRAVENOUS | Status: DC | PRN
  Administered 2024-06-23: 70 mg via INTRAVENOUS

## 2024-06-23 MED ORDER — BUPIVACAINE HCL (PF) 0.25 % IJ SOLN
INTRAMUSCULAR | Status: DC | PRN
  Administered 2024-06-23: 30 mL

## 2024-06-23 MED ORDER — PROPOFOL 10 MG/ML IV BOLUS
INTRAVENOUS | Status: AC
  Filled 2024-06-23: qty 20

## 2024-06-23 MED ORDER — ONDANSETRON HCL 4 MG/2ML IJ SOLN
INTRAMUSCULAR | Status: DC | PRN
  Administered 2024-06-23: 4 mg via INTRAVENOUS

## 2024-06-23 MED ORDER — LIDOCAINE HCL (PF) 2 % IJ SOLN
INTRAMUSCULAR | Status: AC
  Filled 2024-06-23: qty 5

## 2024-06-23 MED ORDER — PREGABALIN 75 MG PO CAPS
75.0000 mg | ORAL_CAPSULE | ORAL | Status: DC
Start: 2024-06-23 — End: 2024-06-26
  Administered 2024-06-23 – 2024-06-25 (×3): 75 mg via ORAL
  Filled 2024-06-23 (×3): qty 1

## 2024-06-23 MED ORDER — IRRISEPT - 450ML BOTTLE WITH 0.05% CHG IN STERILE WATER, USP 99.95% OPTIME
TOPICAL | Status: DC | PRN
  Administered 2024-06-23 (×2): 450 mL

## 2024-06-23 MED ORDER — PHENYLEPHRINE HCL-NACL 20-0.9 MG/250ML-% IV SOLN
INTRAVENOUS | Status: DC | PRN
  Administered 2024-06-23: 30 ug/min via INTRAVENOUS

## 2024-06-23 MED ORDER — METHYLPREDNISOLONE SODIUM SUCC 125 MG IJ SOLR
INTRAMUSCULAR | Status: AC
  Filled 2024-06-23: qty 2

## 2024-06-23 MED ORDER — ACETAMINOPHEN 10 MG/ML IV SOLN
INTRAVENOUS | Status: DC | PRN
  Administered 2024-06-23: 1000 mg via INTRAVENOUS

## 2024-06-23 MED ORDER — STERILE WATER FOR IRRIGATION IR SOLN
Status: DC | PRN
  Administered 2024-06-23: 3000 mL via INTRAVESICAL
  Administered 2024-06-23: 1000 mL

## 2024-06-23 MED ORDER — FENTANYL CITRATE (PF) 100 MCG/2ML IJ SOLN
INTRAMUSCULAR | Status: DC | PRN
  Administered 2024-06-23 (×8): 25 ug via INTRAVENOUS

## 2024-06-23 MED ORDER — PHENYLEPHRINE HCL-NACL 20-0.9 MG/250ML-% IV SOLN
INTRAVENOUS | Status: AC
  Filled 2024-06-23: qty 750

## 2024-06-23 MED ORDER — DEXAMETHASONE SODIUM PHOSPHATE 10 MG/ML IJ SOLN
INTRAMUSCULAR | Status: DC | PRN
Start: 2024-06-23 — End: 2024-06-23
  Administered 2024-06-23: 10 mg via INTRAVENOUS

## 2024-06-23 MED ORDER — FENTANYL CITRATE PF 50 MCG/ML IJ SOSY: 25.0000 ug | PREFILLED_SYRINGE | INTRAMUSCULAR | Status: DC | PRN

## 2024-06-23 MED ORDER — ACETAMINOPHEN 10 MG/ML IV SOLN
INTRAVENOUS | Status: AC
  Filled 2024-06-23: qty 100

## 2024-06-23 MED ORDER — CHLORHEXIDINE GLUCONATE CLOTH 2 % EX PADS
6.0000 | MEDICATED_PAD | Freq: Every day | CUTANEOUS | Status: DC
  Administered 2024-06-23 – 2024-06-26 (×4): 6 via TOPICAL

## 2024-06-23 MED ORDER — ONDANSETRON HCL 4 MG/2ML IJ SOLN: 4.0000 mg | Freq: Once | INTRAMUSCULAR | Status: DC | PRN

## 2024-06-23 MED ORDER — LIDOCAINE HCL (PF) 2 % IJ SOLN
INTRAMUSCULAR | Status: DC | PRN
  Administered 2024-06-23: 100 mg via INTRADERMAL

## 2024-06-23 MED ORDER — LACTATED RINGERS IV SOLN: INTRAVENOUS | Status: DC | PRN

## 2024-06-23 MED ORDER — ACETAMINOPHEN 10 MG/ML IV SOLN: 1000.0000 mg | Freq: Once | INTRAVENOUS | Status: DC | PRN

## 2024-06-23 MED ORDER — METHYLPREDNISOLONE SODIUM SUCC 125 MG IJ SOLR
INTRAMUSCULAR | Status: DC | PRN
  Administered 2024-06-23: 125 mg via INTRAVENOUS

## 2024-06-23 SURGICAL SUPPLY — 35 items
BAG COUNTER SPONGE SURGICOUNT (BAG) IMPLANT
BLADE SURG 15 STRL LF DISP TIS (BLADE) ×3 IMPLANT
BNDG GAUZE DERMACEA FLUFF 4 (GAUZE/BANDAGES/DRESSINGS) ×3 IMPLANT
CATH FOLEY 2W COUNCIL 5CC 16FR (CATHETERS) IMPLANT
COVER SURGICAL LIGHT HANDLE (MISCELLANEOUS) ×3 IMPLANT
DRAPE LAPAROTOMY T 98X78 PEDS (DRAPES) ×3 IMPLANT
DRAPE UTILITY XL STRL (DRAPES) ×3 IMPLANT
DRSG OPSITE POSTOP 4X6 (GAUZE/BANDAGES/DRESSINGS) IMPLANT
ELECT PENCIL ROCKER SW 15FT (MISCELLANEOUS) ×3 IMPLANT
ELECT REM PT RETURN 15FT ADLT (MISCELLANEOUS) ×3 IMPLANT
EVACUATOR DRAINAGE 7X20 100CC (MISCELLANEOUS) IMPLANT
GAUZE 4X4 16PLY ~~LOC~~+RFID DBL (SPONGE) ×3 IMPLANT
GLOVE SURG LX STRL 7.5 STRW (GLOVE) ×3 IMPLANT
GOWN STRL REUS W/ TWL XL LVL3 (GOWN DISPOSABLE) ×3 IMPLANT
GUIDEWIRE STR DUAL SENSOR (WIRE) IMPLANT
KIT BASIN OR (CUSTOM PROCEDURE TRAY) ×3 IMPLANT
KIT TURNOVER KIT A (KITS) ×3 IMPLANT
LAVAGE JET IRRISEPT WOUND (IRRIGATION / IRRIGATOR) IMPLANT
NDL HYPO 22X1.5 SAFETY MO (MISCELLANEOUS) IMPLANT
NEEDLE HYPO 22X1.5 SAFETY MO (MISCELLANEOUS) IMPLANT
NS IRRIG 1000ML POUR BTL (IV SOLUTION) ×3 IMPLANT
PACK BASIC VI WITH GOWN DISP (CUSTOM PROCEDURE TRAY) ×3 IMPLANT
PAD TELFA 2X3 NADH STRL (GAUZE/BANDAGES/DRESSINGS) IMPLANT
PLUG CATH AND CAP STRL 200 (CATHETERS) IMPLANT
SOL PREP POV-IOD 4OZ 10% (MISCELLANEOUS) ×3 IMPLANT
SUPPORTER AHLETIC TETRA LG (SOFTGOODS) ×3 IMPLANT
SUT CHROMIC 3 0 SH 27 (SUTURE) ×3 IMPLANT
SUT CHROMIC 4 0 RB 1X27 (SUTURE) IMPLANT
SUT VIC AB 2-0 SH 27X BRD (SUTURE) IMPLANT
SUT VIC AB 2-0 UR5 27 (SUTURE) IMPLANT
SUT VICRYL 0 TIES 12 18 (SUTURE) IMPLANT
SYR CONTROL 10ML LL (SYRINGE) ×3 IMPLANT
TOWEL OR 17X26 10 PK STRL BLUE (TOWEL DISPOSABLE) ×3 IMPLANT
WATER STERILE IRR 1000ML POUR (IV SOLUTION) ×3 IMPLANT
YANKAUER SUCT BULB TIP 10FT TU (MISCELLANEOUS) ×3 IMPLANT

## 2024-06-23 NOTE — TOC Initial Note (Signed)
 Transition of Care [facility]) - Initial/Assessment Note    Patient Details  Name: [name] MRN: [id number] Date of Birth: [birth year]  Transition of Care [facility]) CM/SW Contact:    Sheri ONEIDA Sharps, LCSW Phone Number: 06/23/2024, 4:26 PM  Clinical Narrative:                 Pt from home w/ spouse. Pt continues medical workup.TOC following for dc needs.     Barriers to Discharge: Continued Medical Work up   Patient Goals and CMS Choice Patient states their goals for this hospitalization and ongoing recovery are:: return home   Choice offered to / list presented to : NA      Expected Discharge Plan and Services In-house Referral: NA Discharge Planning Services: NA   Living arrangements for the past 2 months: Single Family Home                 DME Arranged: N/A DME Agency: NA       HH Arranged: NA HH Agency: NA        Prior Living Arrangements/Services Living arrangements for the past 2 months: Single Family Home Lives with:: Spouse Patient language and need for interpreter reviewed:: Yes Do you feel safe going back to the place where you live?: Yes        Care giver support system in place?: Yes (comment)   Criminal Activity/Legal Involvement Pertinent to Current Situation/Hospitalization: No - Comment as needed  Activities of Daily Living   ADL Screening (condition at time of admission) Independently performs ADLs?: No Does the patient have a NEW difficulty with bathing/dressing/toileting/self-feeding that is expected to last >3 days?: No Does the patient have a NEW difficulty with getting in/out of bed, walking, or climbing stairs that is expected to last >3 days?: No Does the patient have a NEW difficulty with communication that is expected to last >3 days?: No Is the patient deaf or have difficulty hearing?: No Does the patient have difficulty seeing, even when wearing glasses/contacts?: No Does the patient have difficulty concentrating, remembering, or  making decisions?: No  Permission Sought/Granted                  Emotional Assessment Appearance:: Appears stated age Attitude/Demeanor/Rapport: Engaged Affect (typically observed): Accepting Orientation: : Oriented to Self, Oriented to Place, Oriented to Situation Alcohol  / Substance Use: Not Applicable Psych Involvement: No (comment)  Admission diagnosis:  Infected penile implant [facility]) [T83.61XA] Patient Active Problem List   Diagnosis Date Noted   Infected penile implant (HCC) 06/21/2024   Acute on chronic hypoxic respiratory failure (HCC) 06/21/2024   Hx of polymyalgia rheumatica 06/21/2024   Community acquired pneumonia of left lower lobe of lung 06/21/2024   Impacted cerumen of both ears 05/16/2024   Cellulitis of right groin 04/15/2024   IDA (iron deficiency anemia) 07/13/2023   Decubitus ulcer of coccygeal region, stage 2 (HCC) 07/06/2023   Elevated PSA 07/06/2023   Polymyalgia rheumatica (HCC) 07/06/2023   Interstitial lung disease (HCC) 07/06/2023   Leukocytosis 07/06/2023   Edema of left lower extremity 06/15/2023   Prostate hypertrophy 05/16/2023   Normocytic anemia 05/08/2023   IgM monoclonal gammopathy of uncertain significance 05/08/2023   Other constipation 05/08/2023   Neutrophilic leukocytosis 05/08/2023   Thrombocytosis 05/08/2023   Family hx-leukemia 05/08/2023   Adjustment disorder with mixed anxiety and depressed mood 04/21/2023   Severe protein-calorie malnutrition (HCC) 04/04/2023   Delirium 03/14/2023   Acute eosinophilic pneumonia 03/14/2023   Acute metabolic  encephalopathy 03/13/2023   Paroxysmal A-fib (HCC) 03/13/2023   Pressure injury of skin 03/13/2023   PAF (paroxysmal atrial fibrillation) (HCC) 03/08/2023   Insomnia 03/07/2023   Anxiety 03/07/2023   DVT, lower extremity, distal, acute, right (HCC) 03/07/2023   Pulmonary embolism (HCC) 03/06/2023   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   Aortitis (HCC) 03/06/2023   Atrial  flutter (HCC) 03/06/2023   FUO (fever of unknown origin) 03/06/2023   S/P insertion of endovascular thoracic aortic stent graft 03/06/2023   Acute pulmonary embolism (HCC) 03/05/2023   Chronic respiratory failure with hypoxia (HCC) 03/05/2023   Acute hyponatremia 03/05/2023   Elevated troponin 03/05/2023   Lactic acidosis 03/05/2023   HCAP (healthcare-associated pneumonia) 03/05/2023   Chronic diastolic CHF (congestive heart failure) (HCC) 03/05/2023   BPH (benign prostatic hyperplasia) 03/05/2023   Fever 01/24/2023   S/P AAA repair 08/16/2017   Generalized weakness 04/03/2017   Abdominal pain, RUQ 03/28/2017   Sinusitis 03/14/2017   Skin tear of hand without complication, initial encounter 01/11/2017   Abrasion, multiple sites 01/11/2017   Mass of left hand 10/19/2016   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   AAA (abdominal aortic aneurysm) without rupture (HCC) 01/26/2016   Dupuytren's contracture of left hand 08/26/2014   Contracture of joint of finger of left hand 08/26/2014   Branch retinal vein occlusion 05/27/2014   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   Special screening for malignant neoplasm of prostate 02/18/2014   Urinary frequency 02/18/2014   Incomplete bladder emptying 02/18/2014   AAA (abdominal aortic aneurysm) (HCC) 01/07/2014   Hyperlipidemia 08/20/2013   PMR (polymyalgia rheumatica) (HCC) 08/20/2013   Vitamin D  deficiency 08/20/2013   Impotence of organic origin 08/20/2013   Floater, vitreous 10/19/2012   Cerebrovascular disease 09/28/2012   Cataract, nuclear 03/14/2012   Corneal macula not interfering with central vision 03/14/2012   History of colonic polyps 03/16/2010   PCP:  Shayne Anes, MD Pharmacy:   [facility] - [place], [place] - [address] [place] [address] Phone: [phone] Fax: [phone]     Social Drivers of Health (SDOH) Social History: SDOH Screenings   Food Insecurity: No Food Insecurity  (06/21/2024)  Housing: Low Risk  (06/21/2024)  Transportation Needs: No Transportation Needs (06/21/2024)  Utilities: Not At Risk (06/21/2024)  Financial Resource Strain: Low Risk  (03/07/2023)  Social Connections: Socially Integrated (06/21/2024)  Tobacco Use: Low Risk  (06/21/2024)   SDOH Interventions:     Readmission Risk Interventions    06/23/2024    4:25 PM 04/16/2024   10:10 AM  Readmission Risk Prevention Plan  Post Dischage Appt  Complete  Medication Screening  Complete  Transportation Screening Complete Complete  PCP or Specialist Appt within 3-5 Days Complete   HRI or Home Care Consult Complete   Social Work Consult for Recovery Care Planning/Counseling Complete   Palliative Care Screening Not Applicable   Medication Review [employer]) Complete

## 2024-06-23 NOTE — Op Note (Addendum)
 Operative Note  Preoperative diagnosis:  1.  Infected 3 piece inflatable penile prosthesis  Postoperative diagnosis: 1.  Infected 3 base implantable penile prosthesis 2.  Urethral erosion/injury secondary to penile prosthetic erosion  Procedure(s): 1.  Cystoscopy with complex catheter placement over a wire 2.  Removal of 3 piece infected inflatable penile prosthesis  Surgeon: Sherwood Edison, MD  Assistants: Donnice Brooks, MD--an assistant was necessary due to the complexity of the case to assist in retraction and instrumentation  Resident: Herse Trividi  Anesthesia: General  Complications: None immediate  EBL: 30 cc  Specimens: 1.  Wound culture  Drains/Catheters: 1.  16 French council tip urethral catheter, capped 2.  16 French suprapubic tube indwelling to drainage 3.  Right sided scrotal JP drain  Intraoperative findings: 1.  Cystourethroscopy revealed urethral erosion with protrusion of the penile prosthetic cylinder into the distal urethra.  Proximal urethra was intact.  Prostate moderately obstructing with normal bladder mucosa.  Suprapubic tube balloon was visible  2.  Large amount of purulent drainage surrounding the scrotal pump, infrapubic tubing, and reservoir.  Entire implant able to be explanted including both rear-tip extenders  Indication: 89 year old male with history of penile implant presented with penile implant infection.  He presents for the previously mentioned operation.  Risk and benefits extensively discussed with him and the family specifically in regards to his age and comorbidities.  Seen and examined by pulmonology and internal medicine prior.  He has been off Eliquis  for greater than 48 hours now.  Description of procedure:  The patient was identified and consent was obtained.  The patient was taken to the operating room and placed in the supine position.  The patient was placed under general anesthesia.  Perioperative antibiotics were  administered.   Patient was prepped and draped in a standard sterile fashion and a timeout was performed.  A flexible cystoscope was advanced into the urethra.  A cylinder was immediately visualized in the distal urethra indicating a urethral erosion of the device.  I found the true lumen and passed the scope into the urethra and into the bladder.  Complete cystoscopy was performed with no tumors or stones seen.  I passed a sensor wire through the scope and into the bladder and withdrew the scope.  A 16 French council tip catheter was advanced over the wire and into the bladder and the wire was withdrawn.  10 cc of sterile water  was instilled into the catheter balloon.  Bladder was drained and the catheter was capped.  An approximately 5 cm infrapubic incision was made sharply and carried down through the subcutaneous tissue and fascial layers with Bovie electrocautery.  The cavity was entered and a large amount of purulent drainage was evacuated.  Wound culture was obtained.  Tubing was identified and the scrotal pump was easily able to be extracted onto the operative field.  We then followed the tubing down to the left corpora and made a corporotomy and extracted the left cylinder.  The rear-tip extender remained and we used a tonsil to grasp it and remove it.  Same was performed on the right again following the tubing and making a corporotomy and extracting the right sided cylinder.  This side also did not have the rear-tip extender attached but we were able to extract the rear-tip extender with a tonsil.  We then followed the tubing of the reservoir and we were able to extract the entire reservoir intact.  There was also a large amount of purulent drainage  around the reservoir.  Copious amounts of antibiotic irrigation was used to irrigate the reservoir cavity, bilateral corporotomies, infrapubic wound, and scrotum.  We then closed each corporotomy with vertical mattress 2-0 Vicryl.  I brought a JP drain out  the right side of the scrotum and positioned the drain within the infrapubic cavity.  This was secured down with a 2-0 Vicryl.  Subcutaneous layers were closed with interrupted 2-0 Vicryl.  Skin was closed with staples.  Quarter percent Marcaine  was instilled for anesthetic effect.  This concluded the operation.  Patient tolerated the procedure well and was stable postoperatively.  Plan: Patient be transferred to the stepdown unit and monitored postoperatively.  He will continue IV antibiotics until cultures return.  JP drain likely to be removed in the next day or 2 as long as drainage is low.

## 2024-06-23 NOTE — Progress Notes (Signed)
 Progress Note  Chief Complaint: infected penile prosthesis  History of Present Illness: [name] is a 89 y.o. 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   Here he is afebrile, blood pressure 94/57, normal rate, on 2L [place]. Labs pending, CXR pending.   Interval 06/23/24: Remains afebrile and hemodynamically stable, leukocytosis substantially improving 13 from 20, hgb stable, Cr 1.25 from 1.23, cultures still pending  Past Medical History:  Diagnosis Date   AAA (abdominal aortic aneurysm) (HCC)    Abnormal EKG    Left atrial abnormality   Allergic rhinitis    Allergy    Benign neoplasm of colon 04/14/2010   3 small polyps on colonoscopy by Dr. [name]   Carpal tunnel syndrome    Cataract    Decubitus ulcer of coccygeal region, stage 2 (HCC) 07/06/2023   Degenerative disc disease    Disturbances metabolism of methionine, homocystine, and cystathionine (HCC)    Elevated homocysteine   Elevated prostate specific antigen (PSA)    Elevated PSA 07/06/2023   Hearing loss    Hyperlipidemia    ILD (interstitial lung disease) (HCC) 07/06/2023   Impotence of organic origin    Penile implant   Internal hemorrhoids    Leukocytosis 07/06/2023   Neck pain    Otosclerosis of both ears    Peripheral vascular disease (HCC)    Bilateral femoral bruit   Plantar fasciitis    Polymyalgia rheumatica (HCC)    Polymyalgia rheumatica (HCC) 07/06/2023   Rotator cuff syndrome of left shoulder    Scoliosis    Shoulder pain     Past Surgical History:  Procedure Laterality Date   ABDOMINAL AORTIC ENDOVASCULAR  STENT GRAFT N/A 08/16/2017   Procedure: ABDOMINAL AORTIC ENDOVASCULAR STENT GRAFT insertion;  Surgeon: Serene Gaile ORN, MD;  Location: [facility] OR;  Service: Vascular;  Laterality: N/A;   Actinic keratosis removal  06/21/2010   Left shoulder; Dr. Jadine   COLONOSCOPY     COLONOSCOPY W/ POLYPECTOMY     DUPUYTREN CONTRACTURE RELEASE Right 12/05/2013   Procedure: DUPUYTREN CONTRACTURE RELEASE RIGHT LONG, RING AND SMALL FINGERS;  Surgeon: [name]., MD;  Location: [facility];  Service: Orthopedics;  Laterality: Right;   Implant penile pump  2001   IR ANGIOGRAM PELVIS SELECTIVE OR SUPRASELECTIVE  12/11/2020   IR ANGIOGRAM SELECTIVE EACH ADDITIONAL VESSEL  12/11/2020   IR CYSTOSTOMY TUBE PLACEMENT/BLADDER ASPIRATION  06/11/2024   IR EMBO ARTERIAL NOT HEMORR HEMANG INC GUIDE ROADMAPPING  12/11/2020   IR RADIOLOGIST EVAL & MGMT  07/30/2020   IR RADIOLOGIST EVAL & MGMT  08/16/2023   IR US  GUIDE VASC ACCESS RIGHT  12/11/2020   Resection of appendix and tip of rectum  February 2005   STAPEDECTOMY Bilateral 1985, 1988   [employer]    Home Medications:  No current facility-administered medications on file prior to encounter.   Current Outpatient Medications on File Prior to Encounter  Medication Sig Dispense Refill   Acetaminophen  500 MG capsule Take 500-1,000 mg by mouth See admin instructions. Tylenol  Rapid Release 500 mg capsules- Take 500-1,000 mg by mouth every eight hours as needed for pain  acyclovir  (ZOVIRAX ) 400 MG tablet Take 400 mg by mouth 2 (two) times daily.     butalbital -acetaminophen -caffeine  (FIORICET ) 50-325-40 MG tablet Take 1 tablet by mouth daily as needed for headache. 14 tablet 0   cefdinir (OMNICEF) 300 MG capsule Take 300 mg by mouth 2 (two) times daily.     cholecalciferol (VITAMIN D3) 25 MCG (1000 UNIT) tablet Take 2,000 Units by mouth daily.     dicyclomine  (BENTYL ) 10 MG/5ML solution Take 20 mg by mouth 3 (three) times daily as needed (for cramping  or abdominal pain).     docusate sodium  (COLACE) 100 MG capsule Take 100 mg by mouth at bedtime.     finasteride  (PROSCAR ) 5 MG tablet Take 5 mg by mouth daily.     fluorometholone  (FML) 0.1 % ophthalmic suspension Place 1 drop into the right eye at bedtime.      folic acid  (FOLVITE ) 1 MG tablet Take 1 mg by mouth daily.     HYDROcodone -acetaminophen  (NORCO/VICODIN) 5-325 MG tablet Take 1 tablet by mouth every 6 (six) hours as needed for moderate pain (pain score 4-6).     lansoprazole (PREVACID) 30 MG capsule Take 30 mg by mouth 2 (two) times daily before a meal.     lidocaine  (LIDODERM ) 5 % Place 1 patch onto the skin daily as needed (for pain- Remove & Discard patch within 12 hours or as directed by MD).     LORazepam  (ATIVAN ) 0.5 MG tablet Take 0.5 mg by mouth See admin instructions. Take 0.5 mg by mouth at bedtime and an additional 0.5 mg up to twice a day as needed for anxiety     melatonin 5 MG TABS Take 1 tablet (5 mg total) by mouth at bedtime.     methotrexate (RHEUMATREX) 2.5 MG tablet Take 12.5 mg by mouth every Friday.     mirtazapine  (REMERON ) 15 MG tablet Take 30 mg by mouth at bedtime.     Multiple Vitamins-Minerals (MULTIVITAMIN PO) Take 1 tablet by mouth daily.      polyethylene glycol (MIRALAX  / GLYCOLAX ) 17 g packet Take 17 g by mouth in the morning and at bedtime.     predniSONE  (DELTASONE ) 10 MG tablet Take 10 mg by mouth daily with breakfast.     predniSONE  (DELTASONE ) 2.5 MG tablet Take 2.5 mg by mouth daily with breakfast.     pregabalin  (LYRICA ) 75 MG capsule Take 75 mg by mouth at bedtime.     senna-docusate (SENNA-S) 8.6-50 MG tablet Take 1 tablet by mouth in the morning.     tamsulosin  (FLOMAX ) 0.4 MG CAPS capsule Take 0.8 mg by mouth at bedtime.     zolpidem  (AMBIEN ) 5 MG tablet Take 5 mg by mouth at bedtime.     acetaminophen  (TYLENOL ) 325 MG tablet Take 2 tablets (650 mg total) by mouth every 6 (six) hours as needed for mild pain (or Fever >/= 101). (Patient not  taking: Reported on 06/21/2024)     apixaban  (ELIQUIS ) 2.5 MG TABS tablet Take 2.5 mg by mouth 2 (two) times daily. (Patient not taking: Reported on 06/21/2024)     diclofenac  Sodium (VOLTAREN ) 1 % GEL Apply 2 g topically 4 (four) times daily as needed (shoulder pain). (Patient not taking: Reported on 06/21/2024) 2 g 0   feeding supplement (ENSURE ENLIVE / ENSURE PLUS) LIQD Take 237 mLs by mouth 3 (three) times daily between meals. 237 mL 12   ondansetron  (ZOFRAN ) 4 MG tablet Take 1 tablet (4 mg total) by mouth  every 4 (four) hours as needed for nausea or vomiting. (Patient not taking: Reported on 06/21/2024) 100 tablet 6   pantoprazole  (PROTONIX ) 40 MG tablet Take 1 tablet (40 mg total) by mouth daily before breakfast. (Patient not taking: Reported on 06/21/2024) 30 tablet 3   promethazine  (PHENERGAN ) 12.5 MG tablet Take 1 tablet (12.5 mg total) by mouth daily as needed for nausea or vomiting. (Patient not taking: Reported on 06/21/2024) 30 tablet 6     Allergies:  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   Denosumab  Other (See Comments)    PROLIA  - Bad reaction 12/2023- will not receive this again    Rosuvastatin  Other (See Comments)    Stopped taking due to feeling achy     Doxycycline  Other (See Comments)    Upset stomach, currently taking and tolerating well (??)   Morphine  And Codeine Nausea And Vomiting   Valium [Diazepam] Other (See Comments)    Caused TOO MUCH sedation    Family History  Problem Relation Age of Onset   Heart disease Mother    Emphysema Mother    Leukemia Brother        Chronic lymphocytic leukemia   Colon cancer Neg Hx    Esophageal cancer Neg Hx    Rectal cancer Neg Hx    Stomach cancer Neg Hx     Social History:  reports that he has never smoked. He has never used smokeless tobacco. He reports that he does not drink alcohol  and does not use drugs.  ROS: A complete review of systems was  performed.  All systems are negative except for pertinent findings as noted.  Physical Exam:  Vital signs in last 24 hours: Temp:  [97.7 F (36.5 C)-98 F (36.7 C)] 97.7 F (36.5 C) (07/27 0521) Pulse Rate:  [69-91] 69 (07/27 0521) Resp:  [18-19] 19 (07/27 0521) BP: (96-103)/(57-61) 103/61 (07/27 0521) SpO2:  [83 %-100 %] 100 % (07/27 0521) Constitutional:  Alert and oriented, No acute distress Cardiovascular: Regular rate and rhythm, No JVD Respiratory: Normal respiratory effort, Lungs clear bilaterally GI: Abdomen is soft, nontender, nondistended, no abdominal masses GU: No CVA tenderness, pus from urethral meatus and generalized edema, warm, erythematous, edematous penile shaft and scrotum Lymphatic: No lymphadenopathy Neurologic: Grossly intact, no focal deficits Psychiatric: Normal mood and affect   Laboratory Data:  Recent Labs    06/21/24 2104 06/22/24 0522 06/23/24 0444  WBC  --  20.2* 13.4*  HGB 10.4* 9.9* 9.7*  HCT 32.8* 31.9* 30.6*  PLT  --  303 293    Recent Labs    06/21/24 2104 06/22/24 0522 06/22/24 1540 06/23/24 0444  NA 137 140 137 138  K 4.7 4.3 4.0 4.0  CL 103 107 105 106  GLUCOSE 158* 107* 135* 112*  BUN 28* 30* 28* 29*  CALCIUM  8.4* 7.9* 7.2* 7.6*  CREATININE 1.12 1.37* 1.23 1.25*     Results for orders placed or performed during the hospital encounter of 06/21/24 (from the past 24 hours)  Basic metabolic panel     Status: Abnormal   Collection Time: 06/22/24  3:40 PM  Result Value Ref Range   Sodium 137 135 - 145 mmol/L   Potassium 4.0 3.5 - 5.1 mmol/L   Chloride 105 98 - 111 mmol/L   CO2 23 22 - 32 mmol/L   Glucose, Bld 135 (H) 70 - 99 mg/dL   BUN 28 (H) 8 - 23 mg/dL  Creatinine, Ser 1.23 0.61 - 1.24 mg/dL   Calcium  7.2 (L) 8.9 - 10.3 mg/dL   GFR, Estimated 55 (L) >60 mL/min   Anion gap 9 5 - 15  Lactic acid, plasma     Status: None   Collection Time: 06/22/24  3:40 PM  Result Value Ref Range   Lactic Acid, Venous 1.1 0.5 -  1.9 mmol/L  Sedimentation rate     Status: Abnormal   Collection Time: 06/22/24  3:40 PM  Result Value Ref Range   Sed Rate 75 (H) 0 - 16 mm/hr  Troponin I (High Sensitivity)     Status: Abnormal   Collection Time: 06/22/24  3:40 PM  Result Value Ref Range   Troponin I (High Sensitivity) 25 (H) <18 ng/L  Brain natriuretic peptide     Status: Abnormal   Collection Time: 06/22/24  3:40 PM  Result Value Ref Range   B Natriuretic Peptide 116.1 (H) 0.0 - 100.0 pg/mL  Procalcitonin     Status: None   Collection Time: 06/22/24  3:40 PM  Result Value Ref Range   Procalcitonin 0.82 ng/mL  Strep pneumoniae urinary antigen     Status: None   Collection Time: 06/22/24  5:09 PM  Result Value Ref Range   Strep Pneumo Urinary Antigen NEGATIVE NEGATIVE  CBC     Status: Abnormal   Collection Time: 06/23/24  4:44 AM  Result Value Ref Range   WBC 13.4 (H) 4.0 - 10.5 K/uL   RBC 2.89 (L) 4.22 - 5.81 MIL/uL   Hemoglobin 9.7 (L) 13.0 - 17.0 g/dL   HCT 69.3 (L) 60.9 - 47.9 %   MCV 105.9 (H) 80.0 - 100.0 fL   MCH 33.6 26.0 - 34.0 pg   MCHC 31.7 30.0 - 36.0 g/dL   RDW 81.6 (H) 88.4 - 84.4 %   Platelets 293 150 - 400 K/uL   nRBC 0.0 0.0 - 0.2 %  Basic metabolic panel     Status: Abnormal   Collection Time: 06/23/24  4:44 AM  Result Value Ref Range   Sodium 138 135 - 145 mmol/L   Potassium 4.0 3.5 - 5.1 mmol/L   Chloride 106 98 - 111 mmol/L   CO2 26 22 - 32 mmol/L   Glucose, Bld 112 (H) 70 - 99 mg/dL   BUN 29 (H) 8 - 23 mg/dL   Creatinine, Ser 8.74 (H) 0.61 - 1.24 mg/dL   Calcium  7.6 (L) 8.9 - 10.3 mg/dL   GFR, Estimated 54 (L) >60 mL/min   Anion gap 6 5 - 15  Magnesium      Status: Abnormal   Collection Time: 06/23/24  4:44 AM  Result Value Ref Range   Magnesium  2.6 (H) 1.7 - 2.4 mg/dL  Phosphorus     Status: None   Collection Time: 06/23/24  4:44 AM  Result Value Ref Range   Phosphorus 3.0 2.5 - 4.6 mg/dL  Troponin I (High Sensitivity)     Status: Abnormal   Collection Time: 06/23/24   4:44 AM  Result Value Ref Range   Troponin I (High Sensitivity) 23 (H) <18 ng/L   No results found for this or any previous visit (from the past 240 hours).  Renal Function: Recent Labs    06/21/24 2104 06/22/24 0522 06/22/24 1540 06/23/24 0444  CREATININE 1.12 1.37* 1.23 1.25*   Estimated Creatinine Clearance: 42.2 mL/min (A) (by C-G formula based on SCr of 1.25 mg/dL (H)).  Radiologic Imaging: CT ABDOMEN PELVIS WO CONTRAST Result Date:  06/22/2024 CLINICAL DATA:  Abdominal pain, postoperative sepsis. Diffuse interstitial lung disease. EXAM: CT ABDOMEN AND PELVIS WITHOUT CONTRAST TECHNIQUE: Multidetector CT imaging of the abdomen and pelvis was performed following the standard protocol without IV contrast. RADIATION DOSE REDUCTION: This exam was performed according to the departmental dose-optimization program which includes automated exposure control, adjustment of the mA and/or kV according to patient size and/or use of iterative reconstruction technique. COMPARISON:  CT abdomen and pelvis 05/24/2024 FINDINGS: Lower chest: There are peripheral interstitial opacities in the lung bases with small bilateral pleural effusions. Hepatobiliary: Small hepatic cysts are unchanged. Gallbladder and bile ducts are within normal limits. Pancreas: Unremarkable. No pancreatic ductal dilatation or surrounding inflammatory changes. Spleen: Normal in size without focal abnormality. Adrenals/Urinary Tract: There is a 3.6 cm right renal cyst. There is mild nonspecific bilateral perinephric fat stranding which is new from prior. There is no hydronephrosis or urinary tract calculus. The adrenal glands are within normal limits. Suprapubic Foley catheter decompresses the bladder. Bladder wall thickening is not excluded. There is a small amount of air in the bladder. Skip Stomach/Bowel: Stomach is within normal limits. No evidence of bowel wall thickening, distention, or inflammatory changes. There is a large amount of  stool throughout the entire colon. Appendix is surgically absent. Vascular/Lymphatic: Infrarenal abdominal aortic aneurysm is again seen measuring 6.8 x 5.7 cm, unchanged in size. Patient is status post aorto bi-iliac graft. There is no periaortic fluid or stranding. IVC normal in size. No enlarged lymph nodes are identified. There are atherosclerotic calcifications throughout the aorta. Reproductive: Prostate gland is enlarged. Penile implant is present with reservoir seen in the right inguinal region. There is diffuse scrotal wall edema. There is a 6.3 x 5.5 by 7.2 cm low-attenuation collection surrounding the reservoir in the superior scrotal region with surrounding edema and sent peripheral hyperdensity or calcification of uncertain etiology. Other: There is a small fat containing left inguinal hernia. There is no ascites or free air. There is mild body wall edema. Musculoskeletal: Degenerative changes affect the spine. IMPRESSION: 1. Penile implant with reservoir in the right inguinal region. There is a 7.2 cm low-attenuation collection surrounding the reservoir in the superior scrotal region with surrounding edema and sent peripheral hyperdensity or calcification of uncertain etiology. Findings may represent hematoma, seroma or abscess. 2. Suprapubic Foley catheter decompresses the bladder. Bladder wall thickening is not excluded. Correlate clinically for cystitis. 3. Mild nonspecific bilateral perinephric fat stranding is new from prior. Correlate clinically for infection. 4. Large amount of stool throughout the colon. 5. Small bilateral pleural effusions. 6. Stable infrarenal abdominal aortic aneurysm measuring 6.8 cm. Patient is status post aorto bi-iliac graft. No periaortic fluid or stranding. Aortic Atherosclerosis (ICD10-I70.0). Electronically Signed   By: Greig Pique M.D.   On: 06/22/2024 19:12   Portable chest 1 View Result Date: 06/21/2024 CLINICAL DATA:  Cough EXAM: PORTABLE CHEST 1 VIEW  COMPARISON:  Chest x-ray 12/29/2023.  Chest CT 01/08/2024. FINDINGS: There some peripheral interstitial opacities in the lung bases as seen on prior CT. There is no focal lung infiltrate, pleural effusion or pneumothorax. Heart is mildly enlarged. Aorta is tortuous. No acute fractures are seen. IMPRESSION: 1. No active disease. 2. Mild cardiomegaly. Electronically Signed   By: Greig Pique M.D.   On: 06/21/2024 19:16    Impression/Assessment:  89yo hx above who presents with c/f penile prosthesis infection. Penis and implant are warm to the touch, purulence is expressible from the meatus, no tenderness. Last Eliquis  dose last night.  Will need implant removed. Was being treated for pneumonia, currently on Omnicef, home pulmonologist aware of admission.   Plan:  -Appreciate hospitalist assistance  -home meds and PNA care per their recs -Plan for OR this morning  -Per pulm, higher risk of waiting to explant vs risk of anesthesia  -Vanc/Zosyn  for antibiotics -Continue labs  Jacqulyn CHRISTELLA Bound 06/23/2024, 6:13 AM

## 2024-06-23 NOTE — Anesthesia Postprocedure Evaluation (Signed)
 Anesthesia Post Note  Patient: [name]  Procedure(s) Performed: EXPLORATION, SCROTUM CYSTOSCOPY, FLEXIBLE (Bladder) REMOVAL, PENILE PROSTHESIS (Scrotum)     Patient location during evaluation: PACU Anesthesia Type: General Level of consciousness: awake and alert Pain management: pain level controlled Vital Signs Assessment: post-procedure vital signs reviewed and stable Respiratory status: spontaneous breathing, nonlabored ventilation, respiratory function stable and patient connected to nasal cannula oxygen  Cardiovascular status: blood pressure returned to baseline and stable Postop Assessment: no apparent nausea or vomiting Anesthetic complications: no   No notable events documented.  Last Vitals:  Vitals:   06/23/24 1140 06/23/24 1200  BP: 123/65 129/64  Pulse: 70 70  Resp: (!) 22 18  Temp: 36.7 C   SpO2: 100% 99%    Last Pain:  Vitals:   06/23/24 1140  TempSrc: Oral  PainSc: 0-No pain                 Rome Ade

## 2024-06-23 NOTE — Transfer of Care (Signed)
 Immediate Anesthesia Transfer of Care Note  Patient: [name]  Procedure(s) Performed: EXPLORATION, SCROTUM CYSTOSCOPY, FLEXIBLE (Bladder) REMOVAL, PENILE PROSTHESIS (Scrotum)  Patient Location: PACU  Anesthesia Type:General  Level of Consciousness: drowsy and patient cooperative  Airway & Oxygen  Therapy: Patient Spontanous Breathing and Patient connected to nasal cannula oxygen   Post-op Assessment: Report given to RN and Post -op Vital signs reviewed and stable  Post vital signs: Reviewed and stable  Last Vitals:  Vitals Value Taken Time  BP 106/76 06/23/24 09:21  Temp 36.9 C 06/23/24 09:21  Pulse 78 06/23/24 09:24  Resp 20 06/23/24 09:24  SpO2 99 % 06/23/24 09:24  Vitals shown include unfiled device data.  Last Pain:  Vitals:   06/23/24 0921  TempSrc:   PainSc: Asleep         Complications: No notable events documented.

## 2024-06-23 NOTE — Interval H&P Note (Signed)
 History and Physical Interval Note:  06/23/2024 7:26 AM  [name]  has presented today for surgery, with the diagnosis of infected penile prosthesis.  The various methods of treatment have been discussed with the patient and family. After consideration of risks, benefits and other options for treatment, the patient has consented to  Procedure(s) with comments: EXPLORATION, SCROTUM (N/A) - REMOVAL INFECTED PENILE PROSTHESIS; POSSIBLE CYSTOSCOPY as a surgical intervention.  The patient's history has been reviewed, patient examined, no change in status, stable for surgery.  I have reviewed the patient's chart and labs.  Questions were answered to the patient's satisfaction.     [place] JONETTA Edison, III

## 2024-06-23 NOTE — Anesthesia Procedure Notes (Signed)
 Procedure Name: LMA Insertion Date/Time: 06/23/2024 7:44 AM  Performed by: Judythe Tanda Aran, CRNAPre-anesthesia Checklist: Emergency Drugs available, Patient identified, Suction available and Patient being monitored Patient Re-evaluated:Patient Re-evaluated prior to induction Oxygen  Delivery Method: Circle system utilized Preoxygenation: Pre-oxygenation with 100% oxygen  Induction Type: IV induction Ventilation: Mask ventilation without difficulty LMA: LMA inserted LMA Size: 5.0 Number of attempts: 1 Placement Confirmation: positive ETCO2 and breath sounds checked- equal and bilateral Tube secured with: Tape Dental Injury: Teeth and Oropharynx as per pre-operative assessment

## 2024-06-23 NOTE — Progress Notes (Signed)
*  PRELIMINARY RESULTS* Echocardiogram 2D Echocardiogram has been performed.  [name] 06/23/2024, 12:32 PM

## 2024-06-23 NOTE — Anesthesia Preprocedure Evaluation (Signed)
 Anesthesia Evaluation  Patient identified by MRN, date of birth, ID band Patient awake    Reviewed: Allergy & Precautions, NPO status , Patient's Chart, lab work & pertinent test results  History of Anesthesia Complications Negative for: history of anesthetic complications  Airway Mallampati: II  TM Distance: >3 FB Neck ROM: Full    Dental no notable dental hx. (+) Teeth Intact   Pulmonary neg sleep apnea, pneumonia, unresolved, neg COPD, Patient abstained from smoking.Not current smoker Pneumonia diagnosed last week, on abx. Per primary team note, his lung infiltrate has resolved on imaging but he is still at 2L O2, which is 1L above his home O2 baseline. Patient says his breathing feels fine and at his normal baseline today   + rhonchi        Cardiovascular Exercise Tolerance: Good METS(-) hypertension+ Peripheral Vascular Disease and +CHF  (-) CAD and (-) Past MI (-) dysrhythmias + Valvular Problems/Murmurs AS  Rhythm:Regular Rate:Normal - Systolic murmurs TTE 2024: 1. Left ventricular ejection fraction, by estimation, is 50 to 55%. The  left ventricle has low normal function. Left ventricular endocardial  border not optimally defined to evaluate regional wall motion. Left  ventricular diastolic parameters are  consistent with Grade I diastolic dysfunction (impaired relaxation).   2. Right ventricular systolic function is normal. The right ventricular  size is normal. Tricuspid regurgitation signal is inadequate for assessing  PA pressure.   3. The mitral valve is normal in structure. No evidence of mitral valve  regurgitation. No evidence of mitral stenosis.   4. The aortic valve is tricuspid. There is severe calcifcation of the  aortic valve. Aortic valve regurgitation is not visualized. Mild to  moderate aortic valve stenosis. Aortic valve mean gradient measures 12.0  mmHg. Visually, aortic stenosis looked at  least  moderate but mean gradient only 12 mmHg.   5. Aortic dilatation noted. There is mild dilatation of the ascending  aorta, measuring 43 mm.   6. The inferior vena cava is normal in size with greater than 50%  respiratory variability, suggesting right atrial pressure of 3 mmHg.   7. Technically difficult study with poor acoustic windows.      Neuro/Psych  PSYCHIATRIC DISORDERS Anxiety     negative neurological ROS     GI/Hepatic ,neg GERD  ,,(+)     (-) substance abuse    Endo/Other  neg diabetes    Renal/GU negative Renal ROS     Musculoskeletal  (+) Arthritis ,    Abdominal   Peds  Hematology   Anesthesia Other Findings Past Medical History: No date: AAA (abdominal aortic aneurysm) (HCC) No date: Abnormal EKG     Comment:  Left atrial abnormality No date: Allergic rhinitis No date: Allergy 04/14/2010: Benign neoplasm of colon     Comment:  3 small polyps on colonoscopy by Dr. [name] No date: Carpal tunnel syndrome No date: Cataract 07/06/2023: Decubitus ulcer of coccygeal region, stage 2 (HCC) No date: Degenerative disc disease No date: Disturbances metabolism of methionine, homocystine, and  cystathionine (HCC)     Comment:  Elevated homocysteine No date: Elevated prostate specific antigen (PSA) 07/06/2023: Elevated PSA No date: Hearing loss No date: Hyperlipidemia 07/06/2023: ILD (interstitial lung disease) (HCC) No date: Impotence of organic origin     Comment:  Penile implant No date: Internal hemorrhoids 07/06/2023: Leukocytosis No date: Neck pain No date: Otosclerosis of both ears No date: Peripheral vascular disease (HCC)     Comment:  Bilateral femoral bruit No date:  Plantar fasciitis No date: Polymyalgia rheumatica (HCC) 07/06/2023: Polymyalgia rheumatica (HCC) No date: Rotator cuff syndrome of left shoulder No date: Scoliosis No date: Shoulder pain  Reproductive/Obstetrics                              Anesthesia  Physical Anesthesia Plan  ASA: 3  Anesthesia Plan: General   Post-op Pain Management: Tylenol  PO (pre-op)*   Induction: Intravenous  PONV Risk Score and Plan: 3 and Ondansetron , Dexamethasone  and Treatment may vary due to age or medical condition  Airway Management Planned: LMA  Additional Equipment: None  Intra-op Plan:   Post-operative Plan: Extubation in OR  Informed Consent: I have reviewed the patients History and Physical, chart, labs and discussed the procedure including the risks, benefits and alternatives for the proposed anesthesia with the patient or authorized representative who has indicated his/her understanding and acceptance.   Patient has DNR.  Suspend DNR.   Dental advisory given  Plan Discussed with: CRNA and Surgeon  Anesthesia Plan Comments: (Yesterday discussion was had between surgeon and anesthesiologist discussing risks of anesthesia in this patient. His pulmonary status is likely his biggest concern, and a spinal anesthetic would be ideal, however the surgeon deemed that waiting long enough for anticoagulant washout could potentially lead to spreading of patient's infection and more morbidity. Additionally, patient is only 1L O2 above his baseline, and looks well and and no distress today, and says his breathing feels at his baseline.  Discussed risks of anesthesia with patient, including PONV, sore throat, lip/dental/eye damage, post operative cognitive dysfunction. Rare risks discussed as well, such as cardiorespiratory and neurological sequelae, and allergic reactions. Discussed the role of CRNA in patient's perioperative care. Patient understands.  Patient and family at bedside state patient normally has DNR (not listed on his chart). I discussed in great detail the patients wishes, and he wishes for us  to perform resuscitative measures in the perioperative period in the event of cardiac arrest.)        Anesthesia Quick Evaluation

## 2024-06-23 NOTE — Consult Note (Signed)
 NAME:  [name], MRN:  [id number], DOB:  [birth year], LOS: 2 ADMISSION DATE:  06/21/2024, CONSULTATION DATE:/26/25 REFERRING MD: Dr. Sherwood Edison, Randall COMPLAINT: Pulmonary fibrosis, recent pneumonia and also preoperative pulmonary risk evaluation   History of Present Illness: Provided by patient, his caretaker Denece and wife and reviewed the records.   [name] from the pulmonary clinic.  He is retired and the first elected [address] [place].  He has multiple medical issues putting in at [facility] for the last few years she has had unexplained high ESR for which she has been inter on chronic prednisone  for consideration of PMR [in 2025 saw a rheumatology at [facility] and started methotrexate,?  Temporal arteritis].  In the background he is always had interstitial lung abnormalities [ILA] otherwise call pre-ILD which had been monitored and less recognized.    Since early 2024 has had a decline namely he got daptomycin  at [facility] for potential concern of aortitis.  After this he got admitted in April/May 2024 with acute hypoxemic respiratory failure due to eosinophilic versus organizing pneumonia and also submassive acute PE.  Through all this she has had a high ESR.  He had to go to [facility].  He survive this illness.  ) Follow-up in pulmonary clinic.  Has been left with chronic Foley because of enlarged prostate and bladder that wound contract.  He has a history of penile implant.  He is always had indeterminate QuantiFERON gold [reassured by ID] he had decubitus ulcer that finally improved.  He has been on low-dose Eliquis  he also had anemia history [GI did not evaluate because of high risk for endoscopy] but finally improved with iron therapy.  He has history of atrial fibrillation  Because of all this he was significantly physically deconditioned and fatigued.  But through sheer willpower he slowly improved to the point that he was able to now walk independently a  month ago I was able to go to the putting green and stand.  Most recent office visit March 2025 and June 2025 where he was stable.  He was able to walk 200 feet on room air.  Antifibrotic's cause intense fatigue and has been discussed but not instituted for his ILD because of his significant physical deconditioning and fatigue.  He is now being admitted with penile implant infection and there is need for removal.  Prior to this last week his wife informed me that he had pneumonia and was on antibiotics.  And the current chest x-ray this pneumonia is cleared up.  Is currently using 1-2 L at rest [he always uses oxygen  despite the fact that he can go 200 feet without desaturation].  Wife feels there is an increase in oxygen  need  Urology is considering penile implant removal either under spinal anesthesia in 2 days or general anesthesia 06/23/2024 and is requesting pulmonary input for risk assessment.  He looks like recent send baseline according to urology and his wife.  According to nursing he was able to walk 100 feet without any assistance [did desaturate to 83% at 110 feet on room air].  Admission labs revealed mild acute kidney injury 1.3 mg percent.  Pulmonary is asked for preop clearance   Past Medical History:   has a past medical history of AAA (abdominal aortic aneurysm) (HCC), Abnormal EKG, Allergic rhinitis, Allergy, Benign neoplasm of colon (04/14/2010), Carpal tunnel syndrome, Cataract, Decubitus ulcer of coccygeal region, stage 2 (HCC) (07/06/2023), Degenerative disc disease, Disturbances metabolism  of methionine, homocystine, and cystathionine (HCC), Elevated prostate specific antigen (PSA), Elevated PSA (07/06/2023), Hearing loss, Hyperlipidemia, ILD (interstitial lung disease) (HCC) (07/06/2023), Impotence of organic origin, Internal hemorrhoids, Leukocytosis (07/06/2023), Neck pain, Otosclerosis of both ears, Peripheral vascular disease (HCC), Plantar fasciitis, Polymyalgia rheumatica  (HCC), Polymyalgia rheumatica (HCC) (07/06/2023), Rotator cuff syndrome of left shoulder, Scoliosis, and Shoulder pain.   has a past surgical history that includes Colonoscopy w/ polypectomy; Stapedectomy (Bilateral, 1985, 1988); Implant penile pump (2001); Resection of appendix and tip of rectum (February 2005); Actinic keratosis removal (06/21/2010); Dupuytren contracture release (Right, 12/05/2013); Abdominal aortic endovascular stent graft (N/A, 08/16/2017); Colonoscopy; IR Radiologist Eval & Mgmt (07/30/2020); IR US  Guide Vasc Access Right (12/11/2020); IR Angiogram Selective Each Additional Vessel (12/11/2020); IR Angiogram Pelvis Selective Or Supraselective (12/11/2020); IR EMBO ARTERIAL NOT HEMORR HEMANG INC GUIDE ROADMAPPING (12/11/2020); IR Radiologist Eval & Mgmt (08/16/2023); and IR CYSTOSTOMY TUBE PLACEMENT/BLADDER ASPIRATION (06/11/2024).   Significant Hospital Events:  06/21/2024 - admit 06/22/2024: Pulmonary consult -Moderate High risk for pulm complications but risk improved after BUN nromalized. HRCT for ILD staging done 7/27 -      Interim History / Subjective:   06/23/2024 -He has been off Eliquis  for greater than 48 hours now per Dr Carolee back from OR.  S.p cystoscopy with complex cathtere placemtn over wife and removal of infected penile prostehsis. Dr Carolee described LARGE AMOUNT OF PUS and sp explant of entire implant. Cystourethroscopy revealed urethral erosion with protrusion of the penile prosthetic cylinder into the distal urethra. Proximal urethra was intact. Prostate moderately obstructing with normal bladder mucosa.    Moved to SDU for monitoring given high risk for pulmon complicatons followin surgery -   Objective   Blood pressure (!) 105/49, pulse 71, temperature 97.7 F (36.5 C), temperature source Oral, resp. rate 19, height 6' (1.829 m), weight 80.7 kg, SpO2 99%.        Intake/Output Summary (Last 24 hours) at 06/23/2024 1113 Last data filed at 06/23/2024 0931 Gross per  24 hour  Intake 1998.39 ml  Output 1840 ml  Net 158.39 ml   Filed Weights   06/21/24 1803  Weight: 80.7 kg    Examination: General Appearance:  Looks cushingoind a bit - baseline. Sleepy afer surger Head:  Normocephalic, without obvious abnormality, atraumatic Eyes:  PERRL - yes, conjunctiva/corneas - muddy     Ears:  Normal external ear canals, both ears Nose:  G tube - no but has 4L [place] pulse ox 100% Throat:  ETT TUBE - no , OG tube - no Neck:  Supple,  No enlargement/tenderness/nodules Lungs: NO DISTRESs. HAs crackles Heart:  S1 and S2 normal, no murmur, CVP - no.  Pressors - no Abdomen:  Soft, no masses, no organomegaly Genitalia / Rectal: HAS FOLEY and SUPRAPUBIC Extremities:  Extremities- int Skin:  ntact in exposed areas . Sacral area - not examined Neurologic:  Sedation - post op -> RASS - -1 . Moves all 4s - yes. CAM-ICU - neg . Orientation - x3        Assessment & Plan:   PULMONARY  A:  Chronic interstitial lung disease not otherwise specified [as always had pre-ILD] but in the last 1 year after acute respiratory failure following daptomycin  pneumonitis he developed definite ILD.  Not able to do antifibrotic's because of severe fatigue although he was getting close to  STARTING given improvement in physical conditioning  06/23/2024 -> concerned  ILD might be some worse on HRCT an ddistance to desaturaton in hospital  is half of office  but oxygenation in hospital is adequate   P:   Await high-resolution CT chest Oxygen  for pulse ox goal greater than 90% - He should never get daptomycin  -Will consider antifibrotic's as a outpatient if progression confirmed on CT or APP in < 2 weeks - OPD followup iwht MR  - keep PFT appt in August 2025       History of submassive PE in April 2025 -since then on low-dose Eliquis  because of anemia and concern for GI bleed. - Eliquis  on hold preoperative  7/27 - no evidence of recurrence  P:  Await echo results Restart low  dose elqiusi when able (urology clearanc eneeded) Ensure    Chronic immunesuppression - steroids for 22 years+ methotrexate since 2025 s/p Hydroicor for surgery  Plan  - [facility]  to restart pred when able and methotrexate when able   Recent pneumonia not otherwise specified completed azithromycin  prior to admission Current penile implant infection requires removal   S/p successful explant under LMA and placement of drainage catheters and removal of lot of puse  7/27  P:   Anti-infectives (From admission, onward)    Start     Dose/Rate Route Frequency Ordered Stop   06/22/24 2000  vancomycin  (VANCOCIN ) IVPB 1000 mg/200 mL premix        1,000 mg 200 mL/hr over 60 Minutes Intravenous Every 24 hours 06/21/24 1852     06/21/24 2315  acyclovir  (ZOVIRAX ) tablet 400 mg        400 mg Oral 2 times daily 06/21/24 2225     06/21/24 2000  piperacillin -tazobactam (ZOSYN ) IVPB 3.375 g        3.375 g 12.5 mL/hr over 240 Minutes Intravenous Every 8 hours 06/21/24 1754 06/28/24 2159   06/21/24 1900  vancomycin  (VANCOREADY) IVPB 1500 mg/300 mL        1,500 mg 150 mL/hr over 120 Minutes Intravenous  Once 06/21/24 1809 06/21/24 2250   06/21/24 1845  vancomycin  (VANCOREADY) IVPB 750 mg/150 mL  Status:  Discontinued        750 mg 150 mL/hr over 60 Minutes Intravenous Every 12 hours 06/21/24 1754 06/21/24 1809        New onset acute kidney injury likely due to sepsis [discussed Dr. Bell]  06/23/2024 -improved  P:  Check repeat creatinine this evening    Chronic anemia   P:  - PRBC for hgb </= 6.9gm%    - exceptions are   -  if ACS susepcted/confirmed then transfuse for hgb </= 8.0gm%,  or    -  active bleeding with hemodynamic instability, then transfuse regardless of hemoglobin value   At at all times try to transfuse 1 unit prbc as possible with exception of active hemorrhage      MSK/DERM Physical deconditioning -he has improved significantly in the last 1 year due to show  will follow an exercise  Plan  - Continue physical exercise to the extent possible   Preoperative risk exam pulmonary   -He is moderate high risk for prolonged ventilator dependence after surgery [greater than 20%] he is definitely at high risk for various complication such as pneumonia atelectasis worsening hypoxemia which is higher than his risk for prolonged ventilator dependence.  Most of his risk comes from the fact he is age greater than 25 and has underlying physical deconditioning although he is improved and his renal function status and only to some extent his ILD.  -However continued sepsis and risk of current kidney  injury all have a potential to worsen his pulmonary fibrosis and flared this up especially to acute lung injury.  Therefore sepsis source removal takes high precedence.   His risk ameliorating factors are the fact that he is able to walk 100 feet without desaturating and is got decent physical tone  Plan Post op in SDU x 24h  Best practice (daily eval):  Per [facility]    Discussed with hospitalist and neurologist Dr. Carolee  Family Updates:  wife and son at bedsiide     SIGNATURE    Dr. Dorethia Cave, M.D., F.C.C.P,  Pulmonary and Critical Care Medicine Staff Physician, [facility] Director - Interstitial Lung Disease  Program  Pulmonary Fibrosis [place] [facility] at [facility] [place], [place], [address]  NPI Number:  NPI #[id number]  Pager: [phone], If no answer  -> Check AMION or Try [phone] Telephone (clinical office): [phone] Telephone (research): [phone]  11:13 AM 06/23/2024   06/23/2024 11:13 AM    LABS    PULMONARY No results for input(s): PHART, PCO2ART, PO2ART, HCO3, TCO2, O2SAT in the last 168 hours.  Invalid input(s): PCO2, PO2  CBC Recent Labs  Lab 06/21/24 2104 06/22/24 0522 06/23/24 0444  HGB 10.4* 9.9* 9.7*  HCT 32.8* 31.9* 30.6*  WBC  --  20.2*  13.4*  PLT  --  303 293    COAGULATION Recent Labs  Lab 06/21/24 2104  INR 1.1    CARDIAC  No results for input(s): TROPONINI in the last 168 hours. No results for input(s): PROBNP in the last 168 hours.  CHEMISTRY Recent Labs  Lab 06/21/24 2104 06/22/24 0522 06/22/24 1540 06/23/24 0444  NA 137 140 137 138  K 4.7 4.3 4.0 4.0  CL 103 107 105 106  CO2 26 25 23 26   GLUCOSE 158* 107* 135* 112*  BUN 28* 30* 28* 29*  CREATININE 1.12 1.37* 1.23 1.25*  CALCIUM  8.4* 7.9* 7.2* 7.6*  MG  --   --   --  2.6*  PHOS  --   --   --  3.0   Estimated Creatinine Clearance: 42.2 mL/min (A) (by C-G formula based on SCr of 1.25 mg/dL (H)).   LIVER Recent Labs  Lab 06/21/24 2104  AST 22  ALT 27  ALKPHOS 70  BILITOT 0.4  PROT 6.4*  ALBUMIN  3.0*  INR 1.1     INFECTIOUS Recent Labs  Lab 06/22/24 1540 06/23/24 0444  LATICACIDVEN 1.1  --   PROCALCITON 0.82 0.77     ENDOCRINE CBG (last 3)  No results for input(s): GLUCAP in the last 72 hours.       IMAGING x48h  - image(s) personally visualized  -   highlighted in bold CT ABDOMEN PELVIS WO CONTRAST Result Date: 06/22/2024 CLINICAL DATA:  Abdominal pain, postoperative sepsis. Diffuse interstitial lung disease. EXAM: CT ABDOMEN AND PELVIS WITHOUT CONTRAST TECHNIQUE: Multidetector CT imaging of the abdomen and pelvis was performed following the standard protocol without IV contrast. RADIATION DOSE REDUCTION: This exam was performed according to the departmental dose-optimization program which includes automated exposure control, adjustment of the mA and/or kV according to patient size and/or use of iterative reconstruction technique. COMPARISON:  CT abdomen and pelvis 05/24/2024 FINDINGS: Lower chest: There are peripheral interstitial opacities in the lung bases with small bilateral pleural effusions. Hepatobiliary: Small hepatic cysts are unchanged. Gallbladder and bile ducts are within normal limits. Pancreas:  Unremarkable. No pancreatic ductal dilatation or surrounding inflammatory  changes. Spleen: Normal in size without focal abnormality. Adrenals/Urinary Tract: There is a 3.6 cm right renal cyst. There is mild nonspecific bilateral perinephric fat stranding which is new from prior. There is no hydronephrosis or urinary tract calculus. The adrenal glands are within normal limits. Suprapubic Foley catheter decompresses the bladder. Bladder wall thickening is not excluded. There is a small amount of air in the bladder. Skip Stomach/Bowel: Stomach is within normal limits. No evidence of bowel wall thickening, distention, or inflammatory changes. There is a large amount of stool throughout the entire colon. Appendix is surgically absent. Vascular/Lymphatic: Infrarenal abdominal aortic aneurysm is again seen measuring 6.8 x 5.7 cm, unchanged in size. Patient is status post aorto bi-iliac graft. There is no periaortic fluid or stranding. IVC normal in size. No enlarged lymph nodes are identified. There are atherosclerotic calcifications throughout the aorta. Reproductive: Prostate gland is enlarged. Penile implant is present with reservoir seen in the right inguinal region. There is diffuse scrotal wall edema. There is a 6.3 x 5.5 by 7.2 cm low-attenuation collection surrounding the reservoir in the superior scrotal region with surrounding edema and sent peripheral hyperdensity or calcification of uncertain etiology. Other: There is a small fat containing left inguinal hernia. There is no ascites or free air. There is mild body wall edema. Musculoskeletal: Degenerative changes affect the spine. IMPRESSION: 1. Penile implant with reservoir in the right inguinal region. There is a 7.2 cm low-attenuation collection surrounding the reservoir in the superior scrotal region with surrounding edema and sent peripheral hyperdensity or calcification of uncertain etiology. Findings may represent hematoma, seroma or abscess. 2.  Suprapubic Foley catheter decompresses the bladder. Bladder wall thickening is not excluded. Correlate clinically for cystitis. 3. Mild nonspecific bilateral perinephric fat stranding is new from prior. Correlate clinically for infection. 4. Large amount of stool throughout the colon. 5. Small bilateral pleural effusions. 6. Stable infrarenal abdominal aortic aneurysm measuring 6.8 cm. Patient is status post aorto bi-iliac graft. No periaortic fluid or stranding. Aortic Atherosclerosis (ICD10-I70.0). Electronically Signed   By: Greig Pique M.D.   On: 06/22/2024 19:12   Portable chest 1 View Result Date: 06/21/2024 CLINICAL DATA:  Cough EXAM: PORTABLE CHEST 1 VIEW COMPARISON:  Chest x-ray 12/29/2023.  Chest CT 01/08/2024. FINDINGS: There some peripheral interstitial opacities in the lung bases as seen on prior CT. There is no focal lung infiltrate, pleural effusion or pneumothorax. Heart is mildly enlarged. Aorta is tortuous. No acute fractures are seen. IMPRESSION: 1. No active disease. 2. Mild cardiomegaly. Electronically Signed   By: Greig Pique M.D.   On: 06/21/2024 19:16

## 2024-06-23 NOTE — Progress Notes (Signed)
  Progress Note   Patient: [name] FMW:[id number] DOB: 1930/05/31 DOA: 06/21/2024     2 DOS: the patient was seen and examined on 06/23/2024   I responded to a page from the nurse about patient feeling of burning sensation throughout the body as well as having pains all over his body with feeling of as though he is going to die. Stat EKG showed sinus rhythm with no ST segment elevation.  Vitals stable at this time.  Saturating 100% on his regular 2 L denying any ongoing chest pain.  Blood pressure slightly soft 104/50s.  Patient appeared anxious A dose of 0.25 of Ativan  given by nurse.  Patient's wife had given an unprescribed 0.5 of Ativan  making a total of 0.75 mg.  Ordered 500 mL of normal saline bolus.  Patient and wife reassured as management is being continued.  Author: Drue ONEIDA Potter, MD 06/23/2024 7:15 PM    For on call review [email or web address].

## 2024-06-23 NOTE — Progress Notes (Signed)
 Progress Note   Patient: [name] FMW:[id number] DOB: 1930/05/05 DOA: 06/21/2024     2 DOS: the patient was seen and examined on 06/23/2024     Brief hospital course: [name] is a 89 year old with PMH of BPH with LUTS, urinary retention s/p suprapubic catheter, HLD, HTN, interstitial lung disease, anxiety and depression, polymyalgia rheumatica, paroxysmal A-fib on Eliquis , PE/DVT, AAA s/p stent graft in 2018, chronic diastolic HF, IDA and a recently diagnosed pneumonia who was directly admitted by urology for infected penile prosthesis.  TRH consulted to manage medical problems. On 06/23/2024 [facility]  hospitalist was admitted primary  team as patient was transferred to stepdown after his surgery.   Assessment and Plan:   # Acute on chronic hypoxic respiratory failure # Interstitial lung disease # Community-acquired pneumonia - Diagnosed with LLL PNA by PCP on 7/21 and started on Augmentin and azithromycin  Uses 2 L of oxygen  at home - Repeat CXR shows resolution of the focal lung infiltrate Currently on IV vancomycin  and Zosyn  for infection around the penile implant site. - As needed DuoNebs - Incentive spirometer, flutter valve   Acute kidney injury-improved Renal function slightly better today compared to yesterday Patient received IV fluid yesterday and a bolus ordered for today Monitor renal function   # Paroxysmal A-fib - Rate controlled, HR in the 80s - EKG shows sinus tach - Last Eliquis  dose on p.m. of 7/24, continue to hold until cleared by surgeon to resume perioperatively   # Polymyalgia rheumatica # Right shoulder pain - Follows with [facility], on prednisone  12.5 mg daily and methotrexate 12.5 mg weekly - Hold methotrexate in the setting of acute infection - Continue prednisone  and Lyrica    # IDA Monitor CBC   # Hx of DVT/PE - Hold Eliquis    # Anxiety and depression - Continue mirtazapine  and lorazepam    # Insomnia - Continue melatonin and  as needed zolpidem    # Hx of herpes ophthalmicus - Continue acyclovir  to prevent herpes flare   # AAA/ s/p Repair - Appears stable on recent CT   # GERD - Continue PPI   # BPH # Urinary retention s/p suprapubic catheter - Continue finasteride  and tamsulosin  - Foley management per primary team   # Infected penile implant - Management per primary team Patient was taken to the OR on 06/23/2024 by urology Underwent cystoscopy with complex catheter placement as well as removal of infected inflatable penile prosthesis Lots of  pus was drained and JP drain currently in place Continue vancomycin  as well as Zosyn  Follow-up on culture results    Family Communication: Discussed plan with spouse and caretaker at bedside      Subjective:  Patient seen and evaluated at bedside today Still on 2 L of intranasal oxygen  Leukocytosis and renal function much better today He denies any acute overnight event    Physical Exam: General: Pleasant, laying in bed in no acute distress on intranasal oxygen  HEENT: [place]/AT. Anicteric sclera CV: RRR. No murmurs, rubs, or gallops. No LE edema Pulmonary: On 2LNC. Lungs CTAB. Normal effort. Fine crackles. No wheezing or rhonchi. Abdominal: Soft, nontender, nondistended.  JP drain in place. Extremities: Palpable radial and DP pulses. Normal ROM. Skin: Warm and dry. No obvious rash or lesions. GU: Suprapubic catheter stable in place with clear yellowish urine in bag Neuro: A&Ox3. Moves all extremities. Normal sensation to light touch. No focal deficit. Psych: Normal mood and affect    Disposition: Pending culture results and clearance by urology before discharge.  Data Reviewed:     Latest Ref Rng & Units 06/23/2024    4:44 AM 06/22/2024    5:22 AM 06/21/2024    9:04 PM  CBC  WBC 4.0 - 10.5 K/uL 13.4  20.2    Hemoglobin 13.0 - 17.0 g/dL 9.7  9.9  89.5   Hematocrit 39.0 - 52.0 % 30.6  31.9  32.8   Platelets 150 - 400 K/uL 293  303         Latest  Ref Rng & Units 06/23/2024    4:44 AM 06/22/2024    3:40 PM 06/22/2024    5:22 AM  BMP  Glucose 70 - 99 mg/dL 887  864  892   BUN 8 - 23 mg/dL 29  28  30    Creatinine 0.61 - 1.24 mg/dL 8.74  8.76  8.62   Sodium 135 - 145 mmol/L 138  137  140   Potassium 3.5 - 5.1 mmol/L 4.0  4.0  4.3   Chloride 98 - 111 mmol/L 106  105  107   CO2 22 - 32 mmol/L 26  23  25    Calcium  8.9 - 10.3 mg/dL 7.6  7.2  7.9     Vitals:   06/23/24 1200 06/23/24 1300 06/23/24 1539 06/23/24 1600  BP: 129/64 (!) 127/100  121/85  Pulse: 70 69  80  Resp: 18 14  17   Temp:   (!) 96.4 F (35.8 C)   TempSrc:   Axillary   SpO2: 99% 97%  (!) 83%  Weight:      Height:         Author: Drue ONEIDA Potter, MD 06/23/2024 4:49 PM  For on call review [email or web address].

## 2024-06-24 ENCOUNTER — Encounter (HOSPITAL_COMMUNITY): Payer: Self-pay | Admitting: Urology

## 2024-06-24 DIAGNOSIS — T8361XD Infection and inflammatory reaction due to implanted penile prosthesis, subsequent encounter: Secondary | ICD-10-CM | POA: Diagnosis not present

## 2024-06-24 DIAGNOSIS — N179 Acute kidney failure, unspecified: Secondary | ICD-10-CM | POA: Diagnosis not present

## 2024-06-24 DIAGNOSIS — J849 Interstitial pulmonary disease, unspecified: Secondary | ICD-10-CM | POA: Diagnosis not present

## 2024-06-24 DIAGNOSIS — T8361XS Infection and inflammatory reaction due to implanted penile prosthesis, sequela: Secondary | ICD-10-CM | POA: Diagnosis not present

## 2024-06-24 LAB — URINE CULTURE: Culture: >=100000 — AB

## 2024-06-24 LAB — CBC
HCT: 33.3 % — ABNORMAL LOW (ref 39.0–52.0)
Hemoglobin: 10.1 g/dL — ABNORMAL LOW (ref 13.0–17.0)
MCH: 33.2 pg (ref 26.0–34.0)
MCHC: 30.3 g/dL (ref 30.0–36.0)
MCV: 109.5 fL — ABNORMAL HIGH (ref 80.0–100.0)
Platelets: 290 K/uL (ref 150–400)
RBC: 3.04 MIL/uL — ABNORMAL LOW (ref 4.22–5.81)
RDW: 18.1 % — ABNORMAL HIGH (ref 11.5–15.5)
WBC: 21.9 K/uL — ABNORMAL HIGH (ref 4.0–10.5)
nRBC: 0 % (ref 0.0–0.2)

## 2024-06-24 LAB — PHOSPHORUS: Phosphorus: 2.1 mg/dL — ABNORMAL LOW (ref 2.5–4.6)

## 2024-06-24 LAB — BASIC METABOLIC PANEL WITH GFR
Anion gap: 9 (ref 5–15)
BUN: 29 mg/dL — ABNORMAL HIGH (ref 8–23)
CO2: 22 mmol/L (ref 22–32)
Calcium: 7.5 mg/dL — ABNORMAL LOW (ref 8.9–10.3)
Chloride: 107 mmol/L (ref 98–111)
Creatinine, Ser: 1.27 mg/dL — ABNORMAL HIGH (ref 0.61–1.24)
GFR, Estimated: 53 mL/min — ABNORMAL LOW (ref >60)
Glucose, Bld: 181 mg/dL — ABNORMAL HIGH (ref 70–99)
Potassium: 4.1 mmol/L (ref 3.5–5.1)
Sodium: 138 mmol/L (ref 135–145)

## 2024-06-24 LAB — MAGNESIUM: Magnesium: 2.7 mg/dL — ABNORMAL HIGH (ref 1.7–2.4)

## 2024-06-24 LAB — BRAIN NATRIURETIC PEPTIDE: B Natriuretic Peptide: 187.6 pg/mL — ABNORMAL HIGH (ref 0.0–100.0)

## 2024-06-24 MED ORDER — FUROSEMIDE 10 MG/ML IJ SOLN
20.0000 mg | Freq: Once | INTRAMUSCULAR | Status: AC
  Administered 2024-06-24: 20 mg via INTRAVENOUS
  Filled 2024-06-24: qty 2

## 2024-06-24 MED ORDER — APIXABAN 2.5 MG PO TABS
2.5000 mg | ORAL_TABLET | Freq: Two times a day (BID) | ORAL | Status: DC
  Administered 2024-06-24 – 2024-06-26 (×5): 2.5 mg via ORAL
  Filled 2024-06-24 (×5): qty 1

## 2024-06-24 MED ORDER — SODIUM CHLORIDE 0.9 % IV SOLN
2.0000 g | Freq: Two times a day (BID) | INTRAVENOUS | Status: DC
  Administered 2024-06-24 – 2024-06-26 (×5): 2 g via INTRAVENOUS
  Filled 2024-06-24 (×5): qty 12.5

## 2024-06-24 NOTE — Progress Notes (Signed)
 Progress Note   Patient: [name] FMW:[id number] DOB: [birth year] DOA: 06/21/2024     3 DOS: the patient was seen and examined on 06/24/2024     Brief hospital course: [name] is a 89 year old with PMH of BPH with LUTS, urinary retention s/p suprapubic catheter, HLD, HTN, interstitial lung disease, anxiety and depression, polymyalgia rheumatica, paroxysmal A-fib on Eliquis , PE/DVT, AAA s/p stent graft in 2018, chronic diastolic HF, IDA and a recently diagnosed pneumonia who was directly admitted by urology for infected penile prosthesis.  TRH consulted to manage medical problems. On 06/23/2024 [facility]  hospitalist was admitted primary  team as patient was transferred to stepdown after his surgery.   Assessment and Plan:   # Acute on chronic hypoxic respiratory failure # Interstitial lung disease # Community-acquired pneumonia - Diagnosed with LLL PNA by PCP on 7/21 and started on Augmentin and azithromycin  Uses 2 L of oxygen  at home - Repeat CXR shows resolution of the focal lung infiltrate around the penile implant site. - As needed DuoNebs - Incentive spirometer, flutter valve   Acute kidney injury-improved Renal function is around the baseline Monitor renal function   # Paroxysmal A-fib - Rate controlled, HR in the 80s - EKG shows sinus tach - Last Eliquis  dose on p.m. of 7/24, continue to hold until cleared by surgeon to resume perioperatively   # Polymyalgia rheumatica # Right shoulder pain - Follows with [facility], on prednisone  12.5 mg daily and methotrexate 12.5 mg weekly - Hold methotrexate in the setting of acute infection - Continue prednisone  and Lyrica    # IDA Monitor CBC   # Hx of DVT/PE - Hold Eliquis    # Anxiety and depression - Continue mirtazapine  and lorazepam    # Insomnia - Continue melatonin and as needed zolpidem    # Hx of herpes ophthalmicus - Continue acyclovir  to prevent herpes flare   # AAA/ s/p Repair - Appears stable on  recent CT   # GERD - Continue PPI   # BPH # Urinary retention s/p suprapubic catheter - Continue finasteride  and tamsulosin  - Foley management per primary team   # Infected penile implant - Management per primary team Patient was taken to the OR on 06/23/2024 by urology Underwent cystoscopy with complex catheter placement as well as removal of infected inflatable penile prosthesis Lots of  pus was drained and JP drain currently in place Urine culture is growing Citrobacter as well as Pseudomonas Vancomycin  and Zosyn  discontinued and cefepime  initiated based on sensitivity results Follow-up on culture results    Family Communication: Discussed plan with spouse and caretaker at bedside       Subjective:  Patient seen and evaluated at bedside today We will move patient from stepdown to regular floor today He denies chest pain nausea vomiting or pain within the perineal area   Physical Exam: General: Pleasant, laying in bed in no acute distress on intranasal oxygen  HEENT: [place]/AT. Anicteric sclera CV: RRR. No murmurs, rubs, or gallops. No LE edema Pulmonary: On 2LNC. Lungs CTAB. Normal effort. Fine crackles. No wheezing or rhonchi. Abdominal: Soft, nontender, nondistended.  JP drain in place. Extremities: Palpable radial and DP pulses. Normal ROM. Skin: Warm and dry. No obvious rash or lesions. GU: Suprapubic catheter stable in place with clear yellowish urine in bag Neuro: A&Ox3. Moves all extremities. Normal sensation to light touch. No focal deficit. Psych: Normal mood and affect     Disposition: Pending culture results and clearance by urology before discharge.   Data Reviewed:  Latest Ref Rng & Units 06/24/2024   10:56 AM 06/23/2024    4:44 AM 06/22/2024    5:22 AM  CBC  WBC 4.0 - 10.5 K/uL 21.9  13.4  20.2   Hemoglobin 13.0 - 17.0 g/dL 89.8  9.7  9.9   Hematocrit 39.0 - 52.0 % 33.3  30.6  31.9   Platelets 150 - 400 K/uL 290  293  303        Latest Ref Rng &  Units 06/24/2024   10:56 AM 06/23/2024    4:44 AM 06/22/2024    3:40 PM  BMP  Glucose 70 - 99 mg/dL 818  887  864   BUN 8 - 23 mg/dL 29  29  28    Creatinine 0.61 - 1.24 mg/dL 8.72  8.74  8.76   Sodium 135 - 145 mmol/L 138  138  137   Potassium 3.5 - 5.1 mmol/L 4.1  4.0  4.0   Chloride 98 - 111 mmol/L 107  106  105   CO2 22 - 32 mmol/L 22  26  23    Calcium  8.9 - 10.3 mg/dL 7.5  7.6  7.2     Vitals:   06/24/24 1200 06/24/24 1300 06/24/24 1400 06/24/24 1500  BP: (!) 99/53 (!) 96/51 (!) 118/55 (!) 100/39  Pulse: (!) 59 (!) 58 (!) 103 69  Resp: 14 14 20  (!) 23  Temp:      TempSrc:      SpO2: 100% 100% (!) 79% 100%  Weight:      Height:         Author: Drue ONEIDA Potter, MD 06/24/2024 3:52 PM  For on call review [email or web address].

## 2024-06-24 NOTE — Consult Note (Signed)
 Consult Note  Chief Complaint: infected penile prosthesis  History of Present Illness: [name] is a 89 y.o. 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   Here he is afebrile, blood pressure 94/57, normal rate, on 2L [place]. Labs pending, CXR pending.   Interval 06/24/24: Now POD1 from IPP explant, case notable for distal tip erosion into urethra as well as two distinct large purulent pockets, successful removal of entire implant with copious irrigation, drain in place. Remained afebrile overnight, soft pressures, appropriate UoP, labs this AM pending. Ucx growing pseudomonas and citrobacter, sensitivities pending, wound culture with rare GNRs.    Past Medical History:  Diagnosis Date   AAA (abdominal aortic aneurysm) (HCC)    Abnormal EKG    Left atrial abnormality   Allergic rhinitis    Allergy    Benign neoplasm of colon 04/14/2010   3 small polyps on colonoscopy by Dr. [name]   Carpal tunnel syndrome    Cataract    Decubitus ulcer of coccygeal region, stage 2 (HCC) 07/06/2023   Degenerative disc disease    Disturbances metabolism of methionine, homocystine, and cystathionine (HCC)    Elevated homocysteine   Elevated prostate specific antigen (PSA)    Elevated PSA 07/06/2023   Hearing loss    Hyperlipidemia    ILD (interstitial lung disease) (HCC) 07/06/2023   Impotence of organic origin    Penile implant   Internal hemorrhoids    Leukocytosis 07/06/2023   Neck pain    Otosclerosis of both ears    Peripheral vascular disease (HCC)    Bilateral femoral bruit   Plantar fasciitis     Polymyalgia rheumatica (HCC)    Polymyalgia rheumatica (HCC) 07/06/2023   Rotator cuff syndrome of left shoulder    Scoliosis    Shoulder pain     Past Surgical History:  Procedure Laterality Date   ABDOMINAL AORTIC ENDOVASCULAR STENT GRAFT N/A 08/16/2017   Procedure: ABDOMINAL AORTIC ENDOVASCULAR STENT GRAFT insertion;  Surgeon: Serene Gaile ORN, MD;  Location: [facility] OR;  Service: Vascular;  Laterality: N/A;   Actinic keratosis removal  06/21/2010   Left shoulder; Dr. Jadine   COLONOSCOPY     COLONOSCOPY W/ POLYPECTOMY     CYSTOSCOPY N/A 06/23/2024   Procedure: PHYLLIS SIDE;  Surgeon: [name], MD;  Location: [facility];  Service: Urology;  Laterality: N/A;   DUPUYTREN CONTRACTURE RELEASE Right 12/05/2013   Procedure: DUPUYTREN CONTRACTURE RELEASE RIGHT LONG, RING AND SMALL FINGERS;  Surgeon: [name]., MD;  Location: [facility];  Service: Orthopedics;  Laterality: Right;   Implant penile pump  2001   IR ANGIOGRAM PELVIS SELECTIVE OR SUPRASELECTIVE  12/11/2020   IR ANGIOGRAM SELECTIVE EACH ADDITIONAL VESSEL  12/11/2020   IR CYSTOSTOMY TUBE PLACEMENT/BLADDER ASPIRATION  06/11/2024   IR EMBO ARTERIAL NOT HEMORR HEMANG INC GUIDE ROADMAPPING  12/11/2020   IR RADIOLOGIST EVAL & MGMT  07/30/2020   IR RADIOLOGIST EVAL & MGMT  08/16/2023   IR US  GUIDE VASC ACCESS RIGHT  12/11/2020   REMOVAL OF PENILE PROSTHESIS N/A 06/23/2024   Procedure: REMOVAL, PENILE PROSTHESIS;  Surgeon: [name], MD;  Location: [facility];  Service: Urology;  Laterality: N/A;   Resection of appendix and tip of rectum  February 2005   SCROTAL EXPLORATION N/A 06/23/2024   Procedure: EXPLORATION, SCROTUM;  Surgeon: [name], MD;  Location: [facility];  Service: Urology;  Laterality: N/A;  REMOVAL INFECTED PENILE PROSTHESIS; POSSIBLE CYSTOSCOPY   STAPEDECTOMY Bilateral 1985, 1988   [employer]    Home Medications:  No current facility-administered medications on file prior to  encounter.   Current Outpatient Medications on File Prior to Encounter  Medication Sig Dispense Refill   Acetaminophen  500 MG capsule Take 500-1,000 mg by mouth See admin instructions. Tylenol  Rapid Release 500 mg capsules- Take 500-1,000 mg by mouth every eight hours as needed for pain     acyclovir  (ZOVIRAX ) 400 MG tablet Take 400 mg by mouth 2 (two) times daily.     butalbital -acetaminophen -caffeine  (FIORICET ) 50-325-40 MG tablet Take 1 tablet by mouth daily as needed for headache. 14 tablet 0   cefdinir (OMNICEF) 300 MG capsule Take 300 mg by mouth 2 (two) times daily.     cholecalciferol (VITAMIN D3) 25 MCG (1000 UNIT) tablet Take 2,000 Units by mouth daily.     dicyclomine  (BENTYL ) 10 MG/5ML solution Take 20 mg by mouth 3 (three) times daily as needed (for cramping or abdominal pain).     docusate sodium  (COLACE) 100 MG capsule Take 100 mg by mouth at bedtime.     finasteride  (PROSCAR ) 5 MG tablet Take 5 mg by mouth daily.     fluorometholone  (FML) 0.1 % ophthalmic suspension Place 1 drop into the right eye at bedtime.      folic acid  (FOLVITE ) 1 MG tablet Take 1 mg by mouth daily.     HYDROcodone -acetaminophen  (NORCO/VICODIN) 5-325 MG tablet Take 1 tablet by mouth every 6 (six) hours as needed for moderate pain (pain score 4-6).     lansoprazole (PREVACID) 30 MG capsule Take 30 mg by mouth 2 (two) times daily before a meal.     lidocaine  (LIDODERM ) 5 % Place 1 patch onto the skin daily as needed (for pain- Remove & Discard patch within 12 hours or as directed by MD).     LORazepam  (ATIVAN ) 0.5 MG tablet Take 0.5 mg by mouth See admin instructions. Take 0.5 mg by mouth at bedtime and an additional 0.5 mg up to twice a day as needed for anxiety     melatonin 5 MG TABS Take 1 tablet (5 mg total) by mouth at bedtime.     methotrexate (RHEUMATREX) 2.5 MG tablet Take 12.5 mg by mouth every Friday.     mirtazapine  (REMERON ) 15 MG tablet Take 30 mg by mouth at bedtime.     Multiple  Vitamins-Minerals (MULTIVITAMIN PO) Take 1 tablet by mouth daily.      polyethylene glycol (MIRALAX  / GLYCOLAX ) 17 g packet Take 17 g by mouth in the morning and at bedtime.     predniSONE  (DELTASONE ) 10 MG tablet Take 10 mg by mouth daily with breakfast.     predniSONE  (DELTASONE ) 2.5 MG tablet Take 2.5 mg by mouth daily with breakfast.     pregabalin  (LYRICA ) 75 MG capsule Take 75 mg by mouth at bedtime.     senna-docusate (SENNA-S) 8.6-50 MG tablet Take 1 tablet by mouth in the morning.     tamsulosin  (FLOMAX ) 0.4 MG CAPS capsule Take 0.8 mg by mouth at bedtime.     zolpidem  (AMBIEN ) 5 MG tablet Take 5 mg by mouth at  bedtime.     acetaminophen  (TYLENOL ) 325 MG tablet Take 2 tablets (650 mg total) by mouth every 6 (six) hours as needed for mild pain (or Fever >/= 101). (Patient not taking: Reported on 06/21/2024)     apixaban  (ELIQUIS ) 2.5 MG TABS tablet Take 2.5 mg by mouth 2 (two) times daily. (Patient not taking: Reported on 06/21/2024)     diclofenac  Sodium (VOLTAREN ) 1 % GEL Apply 2 g topically 4 (four) times daily as needed (shoulder pain). (Patient not taking: Reported on 06/21/2024) 2 g 0   feeding supplement (ENSURE ENLIVE / ENSURE PLUS) LIQD Take 237 mLs by mouth 3 (three) times daily between meals. 237 mL 12   ondansetron  (ZOFRAN ) 4 MG tablet Take 1 tablet (4 mg total) by mouth every 4 (four) hours as needed for nausea or vomiting. (Patient not taking: Reported on 06/21/2024) 100 tablet 6   pantoprazole  (PROTONIX ) 40 MG tablet Take 1 tablet (40 mg total) by mouth daily before breakfast. (Patient not taking: Reported on 06/21/2024) 30 tablet 3   promethazine  (PHENERGAN ) 12.5 MG tablet Take 1 tablet (12.5 mg total) by mouth daily as needed for nausea or vomiting. (Patient not taking: Reported on 06/21/2024) 30 tablet 6     Allergies:  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   Denosumab  Other (See Comments)    PROLIA  - Bad  reaction 12/2023- will not receive this again    Rosuvastatin  Other (See Comments)    Stopped taking due to feeling achy     Doxycycline  Other (See Comments)    Upset stomach, currently taking and tolerating well (??)   Morphine  And Codeine Nausea And Vomiting   Valium [Diazepam] Other (See Comments)    Caused TOO MUCH sedation    Family History  Problem Relation Age of Onset   Heart disease Mother    Emphysema Mother    Leukemia Brother        Chronic lymphocytic leukemia   Colon cancer Neg Hx    Esophageal cancer Neg Hx    Rectal cancer Neg Hx    Stomach cancer Neg Hx     Social History:  reports that he has never smoked. He has never used smokeless tobacco. He reports that he does not drink alcohol  and does not use drugs.  ROS: A complete review of systems was performed.  All systems are negative except for pertinent findings as noted.  Physical Exam:  Vital signs in last 24 hours: Temp:  [96.4 F (35.8 C)-98.6 F (37 C)] 97.2 F (36.2 C) (07/28 0335) Pulse Rate:  [61-104] 62 (07/28 0600) Resp:  [11-23] 14 (07/28 0600) BP: (85-129)/(40-100) 99/53 (07/28 0600) SpO2:  [83 %-100 %] 100 % (07/28 0600) Weight:  [83.8 kg] 83.8 kg (07/27 1140) Constitutional:  Alert and oriented, No acute distress Cardiovascular: Regular rate and rhythm, No JVD Respiratory: Normal respiratory effort, Lungs clear bilaterally GI: Abdomen is soft, nontender, nondistended, no abdominal masses GU: No CVA tenderness, drain with SS drainage, staple line c/d/i Lymphatic: No lymphadenopathy Neurologic: Grossly intact, no focal deficits Psychiatric: Normal mood and affect   Laboratory Data:  Recent Labs    06/21/24 2104 06/22/24 0522 06/23/24 0444  WBC  --  20.2* 13.4*  HGB 10.4* 9.9* 9.7*  HCT 32.8* 31.9* 30.6*  PLT  --  303 293    Recent Labs    06/21/24 2104 06/22/24 0522 06/22/24 1540 06/23/24 0444  NA 137 140 137  138  K 4.7 4.3 4.0 4.0  CL 103 107 105 106  GLUCOSE 158*  107* 135* 112*  BUN 28* 30* 28* 29*  CALCIUM  8.4* 7.9* 7.2* 7.6*  CREATININE 1.12 1.37* 1.23 1.25*     Results for orders placed or performed during the hospital encounter of 06/21/24 (from the past 24 hours)  Aerobic/Anaerobic Culture w Gram Stain (surgical/deep wound)     Status: None (Preliminary result)   Collection Time: 06/23/24  9:05 AM   Specimen: Wound  Result Value Ref Range   Specimen Description      WOUND Performed at [facility], [address] [place], [place] [address]    Special Requests      NONE Performed at [facility], [address], [place], [place] [address]    Gram Stain      FEW WBC PRESENT, PREDOMINANTLY PMN RARE GRAM NEGATIVE RODS Performed at [facility], [address] [place], [place] [address]    Culture PENDING    Report Status PENDING   MRSA Next Gen by PCR, Nasal     Status: None   Collection Time: 06/23/24 11:45 AM   Specimen: Nasal Mucosa; Nasal Swab  Result Value Ref Range   MRSA by PCR Next Gen NOT DETECTED NOT DETECTED   Recent Results (from the past 240 hours)  Urine Culture     Status: Abnormal (Preliminary result)   Collection Time: 06/22/24  1:49 AM   Specimen: Urine, Clean Catch  Result Value Ref Range Status   Specimen Description   Final    URINE, CLEAN CATCH Performed at [facility], [address], [place], [place] [address]    Special Requests   Final    NONE Performed at [facility], [address] [place], [place] [address]    Culture (A)  Final    >=100,000 COLONIES/mL PSEUDOMONAS AERUGINOSA 70,000 COLONIES/mL CITROBACTER FREUNDII SUSCEPTIBILITIES TO FOLLOW Performed at [facility], [address] [place], [place] [address]    Report Status PENDING  Incomplete  Aerobic/Anaerobic Culture w Gram Stain (surgical/deep wound)     Status: None (Preliminary result)   Collection Time: 06/23/24  9:05 AM   Specimen: Wound   Result Value Ref Range Status   Specimen Description   Final    WOUND Performed at [facility], [address] [place], [place] [address]    Special Requests   Final    NONE Performed at [facility], [address] [place], [place] [address]    Gram Stain   Final    FEW WBC PRESENT, PREDOMINANTLY PMN RARE GRAM NEGATIVE RODS Performed at [facility], [address], [place], [place] [address]    Culture PENDING  Incomplete   Report Status PENDING  Incomplete  MRSA Next Gen by PCR, Nasal     Status: None   Collection Time: 06/23/24 11:45 AM   Specimen: Nasal Mucosa; Nasal Swab  Result Value Ref Range Status   MRSA by PCR Next Gen NOT DETECTED NOT DETECTED Final    Comment: (NOTE) The GeneXpert MRSA Assay (FDA approved for NASAL specimens only), is one component of a comprehensive MRSA colonization surveillance program. It is not intended to diagnose MRSA infection nor to guide or monitor treatment for MRSA infections. Test performance is not FDA approved in patients less than 44 years old. Performed at [facility], [address]  [place], [place] [address]     Renal Function: Recent Labs    06/21/24 2104 06/22/24 0522 06/22/24 1540 06/23/24 0444  CREATININE 1.12 1.37* 1.23 1.25*   Estimated Creatinine Clearance: 42.2 mL/min (A) (by C-G formula based on SCr of 1.25 mg/dL (H)).  Radiologic Imaging: ECHOCARDIOGRAM COMPLETE Result Date: 06/23/2024    ECHOCARDIOGRAM REPORT   Patient Name:   JAVEION CANNEDY Heatherington Date of Exam: 06/23/2024 Medical Rec #:  [id number]      Height:       72.0 in Accession #:    [id number]     Weight:       184.7 lb Date of Birth:  25-Apr-1930     BSA:          2.060 m Patient Age:    89 years       BP:           105/49 mmHg Patient Gender: M              HR:           70 bpm. Exam Location:  Inpatient Procedure: 2D Echo, Color Doppler and Cardiac Doppler (Both Spectral and Color             Flow Doppler were utilized during procedure). Indications:    Acutre respiratory distress R06.03  History:        Patient has no prior history of Echocardiogram examinations,                 most recent 08/28/2023.  Sonographer:    Benard Stallion Referring Phys: [id number] MURALI RAMASWAMY IMPRESSIONS  1. Left ventricular ejection fraction, by estimation, is 55 to 60%. The left ventricle has normal function. The left ventricle has no regional wall motion abnormalities. Left ventricular diastolic parameters are indeterminate.  2. Right ventricular systolic function is normal. The right ventricular size is normal. There is normal pulmonary artery systolic pressure.  3. The mitral valve is grossly normal. Trivial mitral valve regurgitation. No evidence of mitral stenosis.  4. The aortic valve was not well visualized. Aortic valve regurgitation is not visualized. Mild aortic valve stenosis. Aortic valve area, by VTI measures 1.61 cm. Aortic valve mean gradient measures 8.0 mmHg. Aortic valve Vmax measures 1.94 m/s.  5. The inferior vena cava is normal in size with greater than 50% respiratory variability, suggesting right atrial pressure of 3 mmHg. Comparison(s): No significant change from prior study. FINDINGS  Left Ventricle: Left ventricular ejection fraction, by estimation, is 55 to 60%. The left ventricle has normal function. The left ventricle has no regional wall motion abnormalities. The left ventricular internal cavity size was normal in size. There is  no left ventricular hypertrophy. Left ventricular diastolic parameters are indeterminate. Right Ventricle: The right ventricular size is normal. No increase in right ventricular wall thickness. Right ventricular systolic function is normal. There is normal pulmonary artery systolic pressure. The tricuspid regurgitant velocity is 2.28 m/s, and  with an assumed right atrial pressure of 3 mmHg, the estimated right ventricular systolic pressure is 23.8 mmHg. Left  Atrium: Left atrial size was normal in size. Right Atrium: Right atrial size was normal in size. Pericardium: There is no evidence of pericardial effusion. Presence of epicardial fat layer. Mitral Valve: The mitral valve is grossly normal. Trivial mitral valve regurgitation. No evidence of mitral valve stenosis. Tricuspid Valve: The tricuspid valve is grossly normal. Tricuspid valve regurgitation is trivial. No evidence of tricuspid stenosis. Aortic Valve: The aortic valve was not  well visualized. Aortic valve regurgitation is not visualized. Mild aortic stenosis is present. Aortic valve mean gradient measures 8.0 mmHg. Aortic valve peak gradient measures 15.0 mmHg. Aortic valve area, by VTI measures 1.61 cm. Pulmonic Valve: The pulmonic valve was grossly normal. Pulmonic valve regurgitation is not visualized. No evidence of pulmonic stenosis. Aorta: The aortic root is normal in size and structure. Venous: The inferior vena cava is normal in size with greater than 50% respiratory variability, suggesting right atrial pressure of 3 mmHg. IAS/Shunts: The atrial septum is grossly normal.  LEFT VENTRICLE PLAX 2D LVIDd:         3.90 cm   Diastology LVIDs:         2.80 cm   LV e' medial:    5.77 cm/s LV PW:         1.00 cm   LV E/e' medial:  14.5 LV IVS:        1.00 cm   LV e' lateral:   7.62 cm/s LVOT diam:     2.40 cm   LV E/e' lateral: 11.0 LV SV:         68 LV SV Index:   33 LVOT Area:     4.52 cm  RIGHT VENTRICLE RV Basal diam:  3.70 cm RV Mid diam:    2.70 cm RV S prime:     12.40 cm/s TAPSE (M-mode): 2.4 cm LEFT ATRIUM             Index        RIGHT ATRIUM           Index LA diam:        3.60 cm 1.75 cm/m   RA Area:     20.70 cm LA Vol (A2C):   83.0 ml 40.29 ml/m  RA Volume:   59.00 ml  28.64 ml/m LA Vol (A4C):   38.5 ml 18.69 ml/m LA Biplane Vol: 56.9 ml 27.62 ml/m  AORTIC VALVE AV Area (Vmax):    1.63 cm AV Area (Vmean):   1.58 cm AV Area (VTI):     1.61 cm AV Vmax:           193.67 cm/s AV Vmean:           132.333 cm/s AV VTI:            0.422 m AV Peak Grad:      15.0 mmHg AV Mean Grad:      8.0 mmHg LVOT Vmax:         69.80 cm/s LVOT Vmean:        46.300 cm/s LVOT VTI:          0.150 m LVOT/AV VTI ratio: 0.36  AORTA Ao Root diam: 3.30 cm MITRAL VALVE               TRICUSPID VALVE MV Area (PHT): 3.99 cm    TR Peak grad:   20.8 mmHg MV Decel Time: 190 msec    TR Vmax:        228.00 cm/s MV E velocity: 83.60 cm/s MV A velocity: 67.30 cm/s  SHUNTS MV E/A ratio:  1.24        Systemic VTI:  0.15 m                            Systemic Diam: 2.40 cm Darryle Decent MD Electronically signed by Darryle Decent MD Signature Date/Time: 06/23/2024/12:50:27 PM    Final  CT ABDOMEN PELVIS WO CONTRAST Result Date: 06/22/2024 CLINICAL DATA:  Abdominal pain, postoperative sepsis. Diffuse interstitial lung disease. EXAM: CT ABDOMEN AND PELVIS WITHOUT CONTRAST TECHNIQUE: Multidetector CT imaging of the abdomen and pelvis was performed following the standard protocol without IV contrast. RADIATION DOSE REDUCTION: This exam was performed according to the departmental dose-optimization program which includes automated exposure control, adjustment of the mA and/or kV according to patient size and/or use of iterative reconstruction technique. COMPARISON:  CT abdomen and pelvis 05/24/2024 FINDINGS: Lower chest: There are peripheral interstitial opacities in the lung bases with small bilateral pleural effusions. Hepatobiliary: Small hepatic cysts are unchanged. Gallbladder and bile ducts are within normal limits. Pancreas: Unremarkable. No pancreatic ductal dilatation or surrounding inflammatory changes. Spleen: Normal in size without focal abnormality. Adrenals/Urinary Tract: There is a 3.6 cm right renal cyst. There is mild nonspecific bilateral perinephric fat stranding which is new from prior. There is no hydronephrosis or urinary tract calculus. The adrenal glands are within normal limits. Suprapubic Foley catheter decompresses the  bladder. Bladder wall thickening is not excluded. There is a small amount of air in the bladder. Skip Stomach/Bowel: Stomach is within normal limits. No evidence of bowel wall thickening, distention, or inflammatory changes. There is a large amount of stool throughout the entire colon. Appendix is surgically absent. Vascular/Lymphatic: Infrarenal abdominal aortic aneurysm is again seen measuring 6.8 x 5.7 cm, unchanged in size. Patient is status post aorto bi-iliac graft. There is no periaortic fluid or stranding. IVC normal in size. No enlarged lymph nodes are identified. There are atherosclerotic calcifications throughout the aorta. Reproductive: Prostate gland is enlarged. Penile implant is present with reservoir seen in the right inguinal region. There is diffuse scrotal wall edema. There is a 6.3 x 5.5 by 7.2 cm low-attenuation collection surrounding the reservoir in the superior scrotal region with surrounding edema and sent peripheral hyperdensity or calcification of uncertain etiology. Other: There is a small fat containing left inguinal hernia. There is no ascites or free air. There is mild body wall edema. Musculoskeletal: Degenerative changes affect the spine. IMPRESSION: 1. Penile implant with reservoir in the right inguinal region. There is a 7.2 cm low-attenuation collection surrounding the reservoir in the superior scrotal region with surrounding edema and sent peripheral hyperdensity or calcification of uncertain etiology. Findings may represent hematoma, seroma or abscess. 2. Suprapubic Foley catheter decompresses the bladder. Bladder wall thickening is not excluded. Correlate clinically for cystitis. 3. Mild nonspecific bilateral perinephric fat stranding is new from prior. Correlate clinically for infection. 4. Large amount of stool throughout the colon. 5. Small bilateral pleural effusions. 6. Stable infrarenal abdominal aortic aneurysm measuring 6.8 cm. Patient is status post aorto bi-iliac  graft. No periaortic fluid or stranding. Aortic Atherosclerosis (ICD10-I70.0). Electronically Signed   By: Greig Pique M.D.   On: 06/22/2024 19:12    Impression/Assessment:  89yo hx above who is now s/p penile prosthesis explant on 7/27, notable for two large pockets of purulence and distal urethral erosion. Drain in place with serosanginous drainage, SP tube and urethral foley in place. Appreciate hospitalist and pulmonology care of patient's multiple complex medical conditions. Preliminary culture data with pseudomonas and citrobacter, sensitivities pending  Plan:  -Continue foley catheter to drainage -Agree with broad spectrum antibiotics until sensitivities result  -Follow-up labs -Continue drain   Jacqulyn CHRISTELLA Bound 06/24/2024, 6:57 AM

## 2024-06-24 NOTE — Consult Note (Signed)
 NAME:  [name], MRN:  [id number], DOB:  [birth year], LOS: 3 ADMISSION DATE:  06/21/2024, CONSULTATION DATE:/26/25 REFERRING MD: Dr. Sherwood Edison, CHIEF COMPLAINT: Pulmonary fibrosis, recent pneumonia and also preoperative pulmonary risk evaluation   History of Present Illness: Provided by patient, his caretaker Denece and wife and reviewed the records.   Mr. Comp well-known to me from the pulmonary clinic.  He is retired and the first elected [address] of [place].  He has multiple medical issues putting in at [facility] for the last few years she has had unexplained high ESR for which she has been inter on chronic prednisone  for consideration of PMR [in 2025 saw a rheumatology at [facility] and started methotrexate,?  Temporal arteritis].  In the background he is always had interstitial lung abnormalities [ILA] otherwise call pre-ILD which had been monitored and less recognized.    Since early 2024 has had a decline namely he got daptomycin  at [facility] for potential concern of aortitis.  After this he got admitted in April/May 2024 with acute hypoxemic respiratory failure due to eosinophilic versus organizing pneumonia and also submassive acute PE.  Through all this she has had a high ESR.  He had to go to [facility].  He survive this illness.  ) Follow-up in pulmonary clinic.  Has been left with chronic Foley because of enlarged prostate and bladder that wound contract.  He has a history of penile implant.  He is always had indeterminate QuantiFERON gold [reassured by ID] he had decubitus ulcer that finally improved.  He has been on low-dose Eliquis  he also had anemia history [GI did not evaluate because of high risk for endoscopy] but finally improved with iron therapy.  He has history of atrial fibrillation  Because of all this he was significantly physically deconditioned and fatigued.  But through sheer willpower he slowly improved to the point that he was able to now walk independently a  month ago I was able to go to the putting green and stand.  Most recent office visit March 2025 and June 2025 where he was stable.  He was able to walk 200 feet on room air.  Antifibrotic's cause intense fatigue and has been discussed but not instituted for his ILD because of his significant physical deconditioning and fatigue.  He is now being admitted with penile implant infection and there is need for removal.  Prior to this last week his wife informed me that he had pneumonia and was on antibiotics.  And the current chest x-ray this pneumonia is cleared up.  Is currently using 1-2 L at rest [he always uses oxygen  despite the fact that he can go 200 feet without desaturation].  Wife feels there is an increase in oxygen  need  Urology is considering penile implant removal either under spinal anesthesia in 2 days or general anesthesia 06/23/2024 and is requesting pulmonary input for risk assessment.  He looks like recent send baseline according to urology and his wife.  According to nursing he was able to walk 100 feet without any assistance [did desaturate to 83% at 110 feet on room air].  Admission labs revealed mild acute kidney injury 1.3 mg percent.  Pulmonary is asked for preop clearance   Past Medical History:    has a past medical history of AAA (abdominal aortic aneurysm) (HCC), Abnormal EKG, Allergic rhinitis, Allergy, Benign neoplasm of colon (04/14/2010), Carpal tunnel syndrome, Cataract, Decubitus ulcer of coccygeal region, stage 2 (HCC) (07/06/2023), Degenerative disc disease, Disturbances  metabolism of methionine, homocystine, and cystathionine (HCC), Elevated prostate specific antigen (PSA), Elevated PSA (07/06/2023), Hearing loss, Hyperlipidemia, ILD (interstitial lung disease) (HCC) (07/06/2023), Impotence of organic origin, Internal hemorrhoids, Leukocytosis (07/06/2023), Neck pain, Otosclerosis of both ears, Peripheral vascular disease (HCC), Plantar fasciitis, Polymyalgia rheumatica  (HCC), Polymyalgia rheumatica (HCC) (07/06/2023), Rotator cuff syndrome of left shoulder, Scoliosis, and Shoulder pain.   reports that he has never smoked. He has never used smokeless tobacco.  Past Surgical History:  Procedure Laterality Date   ABDOMINAL AORTIC ENDOVASCULAR STENT GRAFT N/A 08/16/2017   Procedure: ABDOMINAL AORTIC ENDOVASCULAR STENT GRAFT insertion;  Surgeon: Serene Gaile ORN, MD;  Location: [facility] OR;  Service: Vascular;  Laterality: N/A;   Actinic keratosis removal  06/21/2010   Left shoulder; Dr. Jadine   COLONOSCOPY     COLONOSCOPY W/ POLYPECTOMY     CYSTOSCOPY N/A 06/23/2024   Procedure: PHYLLIS SIDE;  Surgeon: [name], MD;  Location: [facility] ORS;  Service: Urology;  Laterality: N/A;   DUPUYTREN CONTRACTURE RELEASE Right 12/05/2013   Procedure: DUPUYTREN CONTRACTURE RELEASE RIGHT LONG, RING AND SMALL FINGERS;  Surgeon: [name]., MD;  Location: [facility];  Service: Orthopedics;  Laterality: Right;   Implant penile pump  2001   IR ANGIOGRAM PELVIS SELECTIVE OR SUPRASELECTIVE  12/11/2020   IR ANGIOGRAM SELECTIVE EACH ADDITIONAL VESSEL  12/11/2020   IR CYSTOSTOMY TUBE PLACEMENT/BLADDER ASPIRATION  06/11/2024   IR EMBO ARTERIAL NOT HEMORR HEMANG INC GUIDE ROADMAPPING  12/11/2020   IR RADIOLOGIST EVAL & MGMT  07/30/2020   IR RADIOLOGIST EVAL & MGMT  08/16/2023   IR US  GUIDE VASC ACCESS RIGHT  12/11/2020   REMOVAL OF PENILE PROSTHESIS N/A 06/23/2024   Procedure: REMOVAL, PENILE PROSTHESIS;  Surgeon: [name], MD;  Location: [facility];  Service: Urology;  Laterality: N/A;   Resection of appendix and tip of rectum  February 2005   SCROTAL EXPLORATION N/A 06/23/2024   Procedure: EXPLORATION, SCROTUM;  Surgeon: [name], MD;  Location: [facility];  Service: Urology;  Laterality: N/A;  REMOVAL INFECTED PENILE PROSTHESIS; POSSIBLE CYSTOSCOPY   STAPEDECTOMY Bilateral 1985, 1988   [employer]    Allergies  Allergen Reactions    Daptomycin  Other (See Comments)    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   Denosumab  Other (See Comments)    PROLIA  - Bad reaction 12/2023- will not receive this again    Rosuvastatin  Other (See Comments)    Stopped taking due to feeling achy     Doxycycline  Other (See Comments)    Upset stomach, currently taking and tolerating well (??)   Morphine  And Codeine Nausea And Vomiting   Valium [Diazepam] Other (See Comments)    Caused TOO MUCH sedation    Immunization History  Administered Date(s) Administered   Dtap, Unspecified 06/29/2010   Influenza, High Dose Seasonal PF 09/01/2017, 08/31/2022   Influenza, Quadrivalent, Recombinant, Inj, Pf 09/01/2018, 08/31/2019, 08/19/2020, 09/24/2021   Influenza,inj,Quad PF,6+ Mos 08/20/2013, 08/26/2014, 09/23/2015, 10/04/2016   Influenza-Unspecified 09/22/2006, 09/26/2007, 10/13/2012   PFIZER(Purple Top)SARS-COV-2 Vaccination 12/10/2019, 12/28/2019, 08/10/2020   Pneumococcal Conjugate-13 05/30/2018   Pneumococcal Polysaccharide-23 07/21/1998, 10/26/2022   Tdap 06/29/2010    Family History  Problem Relation Age of Onset   Heart disease Mother    Emphysema Mother    Leukemia Brother        Chronic lymphocytic leukemia   Colon cancer Neg Hx    Esophageal cancer Neg Hx    Rectal cancer Neg Hx  Stomach cancer Neg Hx      Current Facility-Administered Medications:    acetaminophen  (TYLENOL ) tablet 1,000 mg, 1,000 mg, Oral, Q6H, Trivedi, Hersh M, MD, 1,000 mg at 06/24/24 1008   acyclovir  (ZOVIRAX ) tablet 400 mg, 400 mg, Oral, BID, Amponsah, Prosper M, MD, 400 mg at 06/23/24 2116   ceFEPIme  (MAXIPIME ) 2 g in sodium chloride  0.9 % 100 mL IVPB, 2 g, Intravenous, BID, Ellington, Abby K, RPH   Chlorhexidine  Gluconate Cloth 2 % PADS 6 each, 6 each, Topical, Daily, Djan, Prince T, MD, 6 each at 06/23/24 1200   docusate sodium  (COLACE) capsule 100 mg, 100 mg, Oral, BID, Trivedi, Hersh M, MD, 100 mg at 06/24/24 1009   feeding supplement (ENSURE PLUS  HIGH PROTEIN) liquid 237 mL, 237 mL, Oral, TID BM, Carolee Sherwood BIRCH III, MD, 237 mL at 06/23/24 1517   finasteride  (PROSCAR ) tablet 5 mg, 5 mg, Oral, Daily, Lou Claretta HERO, MD, 5 mg at 06/24/24 1011   fluorometholone  (FML) 0.1 % ophthalmic suspension 1 drop, 1 drop, Right Eye, QHS, Carolee Sherwood BIRCH III, MD, 1 drop at 06/23/24 2111   folic acid  (FOLVITE ) tablet 1 mg, 1 mg, Oral, Daily, Carolee Sherwood BIRCH III, MD, 1 mg at 06/24/24 1011   Gerhardt's butt cream, , Topical, BID, Carolee Sherwood BIRCH III, MD, Given at 06/23/24 2111   HYDROcodone -acetaminophen  (NORCO/VICODIN) 5-325 MG per tablet 1 tablet, 1 tablet, Oral, Q6H PRN, Lou Claretta HERO, MD, 1 tablet at 06/23/24 1637   ipratropium-albuterol  (DUONEB) 0.5-2.5 (3) MG/3ML nebulizer solution 3 mL, 3 mL, Nebulization, Q6H PRN, Lou Claretta HERO, MD   LORazepam  (ATIVAN ) tablet 0.25 mg, 0.25 mg, Oral, BID, Carolee Sherwood BIRCH III, MD, 0.25 mg at 06/23/24 1815   LORazepam  (ATIVAN ) tablet 0.5 mg, 0.5 mg, Oral, QHS, Lou Claretta HERO, MD, 0.5 mg at 06/23/24 2113   melatonin tablet 5 mg, 5 mg, Oral, QHS, Carolee Sherwood BIRCH III, MD, 5 mg at 06/23/24 2113   mirtazapine  (REMERON ) tablet 30 mg, 30 mg, Oral, QHS, Lou Claretta HERO, MD, 30 mg at 06/23/24 2113   multivitamin with minerals tablet 1 tablet, 1 tablet, Oral, Daily, Carolee Sherwood BIRCH III, MD, 1 tablet at 06/24/24 1009   ondansetron  (ZOFRAN ) injection 4 mg, 4 mg, Intravenous, Q4H PRN, Trivedi, Hersh M, MD   pantoprazole  (PROTONIX ) EC tablet 40 mg, 40 mg, Oral, Daily, Carolee Sherwood BIRCH III, MD, 40 mg at 06/22/24 1002   polyethylene glycol (MIRALAX  / GLYCOLAX ) packet 17 g, 17 g, Oral, BID, Lou Claretta HERO, MD, 17 g at 06/24/24 1012   predniSONE  (DELTASONE ) tablet 10 mg, 10 mg, Oral, Q breakfast, Carolee Sherwood BIRCH III, MD, 10 mg at 06/24/24 1009   predniSONE  (DELTASONE ) tablet 2.5 mg, 2.5 mg, Oral, Q breakfast, Carolee Sherwood BIRCH III, MD, 2.5 mg at 06/24/24 1010   pregabalin  (LYRICA ) capsule 75 mg, 75 mg, Oral, Q24H, Djan,  Prince T, MD, 75 mg at 06/23/24 2113   senna (SENOKOT) tablet 8.6 mg, 1 tablet, Oral, BID, Trivedi, Hersh M, MD, 8.6 mg at 06/24/24 1010   zolpidem  (AMBIEN ) tablet 5 mg, 5 mg, Oral, QHS PRN, Carolee Sherwood BIRCH III, MD, 5 mg at 06/23/24 2113     Significant Hospital Events:  06/21/2024 - admit 06/22/2024: Pulmonary consult     Interim History / Subjective:   Feeling better. Caregiver and son at bedside. On 1-2LNC  Objective   Blood pressure 127/78, pulse 68, temperature 98.2 F (36.8 C), temperature source Oral, resp. rate 16, height 6' (  1.829 m), weight 83.8 kg, SpO2 96%.        Intake/Output Summary (Last 24 hours) at 06/24/2024 1012 Last data filed at 06/24/2024 0600 Gross per 24 hour  Intake 324.12 ml  Output 1400 ml  Net -1075.88 ml   Filed Weights   06/21/24 1803 06/23/24 1140  Weight: 80.7 kg 83.8 kg    Examination: Gen:      Elderly, well appearing, on nasal cannula Lungs:    bibasilar crackles, no wheze CV:         RRR, systolic murmur Abd:    Soft, JP drain in place with serosanguinous drainage. nontender Ext:    No edema Skin:      Warm and dry; no rashes Neuro:   normal speech, oriented to self, person, situation   CT Chest 7/27 personally reviewed Stable chronic interstitial changes with subpleural retriculation, no honeycombing. Worsening bibasilar atelectasis vs trace pleural effusion  Echo 7/27 shows preserved LVEF, normal RV size and PASP, visualized valves grossly normal.    Assessment & Plan:    PULMONARY  A:  Chronic interstitial lung disease not otherwise specified [as always had pre-ILD] but in the last 1 year after acute respiratory failure following daptomycin  pneumonitis he developed definite ILD.  Not able to do antifibrotic's because of severe fatigue although he was getting close to institution as he was improving  06/24/2024 -> concerned this might be worse because his exertional distance to desaturation appears worsened his crackles appear  somewhat worse   P:   CT Chest reviewed above.  Oxygen  for pulse ox goal greater than 90% - He should never get daptomycin  -Will consider antifibrotic's as a outpatient if he is progressing - will plan for lasix  x1, incentive spirometry reviewed with son at bedside - Dr. Geronimo to follow as an outpatient for discussion on anti-fibrotic therapy.     History of submassive PE in April 2025 -since then on low-dose Eliquis  because of anemia and concern for GI bleed  P:  Resume low dose eliquis  2.5 mg bid while inpatient now that procedure is complete   Chronic immunesuppression - steroids for many years + methotrexate since 2025  Plan - resumed baseline prednisone  - MAP doing ok   Recent pneumonia not otherwise specified completed azithromycin  prior to admission Current penile implant infection requires removal UTI P:   Cefepime  for now Sensitivities reviewed. Potentially could narrow to cipro  at discharge when appropriate   AKI  P:  Likely pre-renal from sepsis Recheck bmet today    Chronic anemia  P:  - PRBC for hgb </= 6.9gm%    - exceptions are   -  if ACS susepcted/confirmed then transfuse for hgb </= 8.0gm%,  or    -  active bleeding with hemodynamic instability, then transfuse regardless of hemoglobin value   At at all times try to transfuse 1 unit prbc as possible with exception of active hemorrhage Resuming eliquis  today   Chronic steroids for many decades and chronic methotrexate since 2025   P:   Likely require stress dose steroids if going for surgery  MSK/DERM Physical deconditioning -he has improved significantly in the last 1 year due to show will follow an exercise  Plan  - Continue physical exercise to the extent possible - PT consult    Best practice (daily eval):  Per TRiad   Agree with transfer to med surg. Pccm will see as needed. He has a follow up with Dr. Geronimo 8/26.   Verdon Gore, MD  Pulmonary and Critical Care  Medicine [facility] 06/24/2024 10:13 AM Pager: see AMION  If no response to pager, please call critical care on call (see AMION) until 7pm After 7:00 pm call Elink       LABS    PULMONARY No results for input(s): PHART, PCO2ART, PO2ART, HCO3, TCO2, O2SAT in the last 168 hours.  Invalid input(s): PCO2, PO2  CBC Recent Labs  Lab 06/21/24 2104 06/22/24 0522 06/23/24 0444  HGB 10.4* 9.9* 9.7*  HCT 32.8* 31.9* 30.6*  WBC  --  20.2* 13.4*  PLT  --  303 293    COAGULATION Recent Labs  Lab 06/21/24 2104  INR 1.1    CARDIAC  No results for input(s): TROPONINI in the last 168 hours. No results for input(s): PROBNP in the last 168 hours.  CHEMISTRY Recent Labs  Lab 06/21/24 2104 06/22/24 0522 06/22/24 1540 06/23/24 0444  NA 137 140 137 138  K 4.7 4.3 4.0 4.0  CL 103 107 105 106  CO2 26 25 23 26   GLUCOSE 158* 107* 135* 112*  BUN 28* 30* 28* 29*  CREATININE 1.12 1.37* 1.23 1.25*  CALCIUM  8.4* 7.9* 7.2* 7.6*  MG  --   --   --  2.6*  PHOS  --   --   --  3.0   Estimated Creatinine Clearance: 42.2 mL/min (A) (by C-G formula based on SCr of 1.25 mg/dL (H)).   LIVER Recent Labs  Lab 06/21/24 2104  AST 22  ALT 27  ALKPHOS 70  BILITOT 0.4  PROT 6.4*  ALBUMIN  3.0*  INR 1.1     INFECTIOUS Recent Labs  Lab 06/22/24 1540 06/23/24 0444  LATICACIDVEN 1.1  --   PROCALCITON 0.82 0.77     ENDOCRINE CBG (last 3)  No results for input(s): GLUCAP in the last 72 hours.

## 2024-06-24 NOTE — Evaluation (Signed)
 Physical Therapy Evaluation Patient Details Name: [name] MRN: [id number] DOB: 1930-06-11 Today's Date: 06/24/2024  History of Present Illness  Pt is 89 yo male admitted 06/21/24 with infected penile implant.  Pt s/p cystoscopy, complex catheter placement, and removal of penile prosthesis on 06/23/24.  Pt with hx including but not limited to BPH with LUTS,  urinary retention s/p suprapubic catheter, HLD, HTN, interstitial lung disease, anxiety and depression, polymyalgia rheumatica, paroxysmal A-fib on Eliquis , PE/DVT, AAA s/p stent graft in 2018, chronic diastolic HF, IDA and a recently diagnosed pneumonia .  Clinical Impression  Pt admitted with above diagnosis. At baseline, pt is ambulatory without AD.  He enjoys exercising and getting out to walk in his driveway.  Pt has necessary DME and support as needed.  Today, he is very motivated to mobilize.  He was able to ambulate 200' with RW, CGA, but did fatigue easily.  Required min A transfers. His vitals were stable but on 3 L O2.   Expected to progress well with likely no PT needs at d/c. Pt currently with functional limitations due to the deficits listed below (see PT Problem List). Pt will benefit from acute skilled PT to increase their independence and safety with mobility to allow discharge.           If plan is discharge home, recommend the following: A little help with walking and/or transfers;Assistance with cooking/housework;A little help with bathing/dressing/bathroom;Help with stairs or ramp for entrance   Can travel by private vehicle        Equipment Recommendations None recommended by PT  Recommendations for Other Services       Functional Status Assessment Patient has had a recent decline in their functional status and demonstrates the ability to make significant improvements in function in a reasonable and predictable amount of time.     Precautions / Restrictions Precautions Precautions:  Fall Precaution/Restrictions Comments: JP drain      Mobility  Bed Mobility Overal bed mobility: Needs Assistance Bed Mobility: Supine to Sit     Supine to sit: Min assist          Transfers Overall transfer level: Needs assistance Equipment used: Rolling walker (2 wheels) Transfers: Sit to/from Stand Sit to Stand: Contact guard assist           General transfer comment: cues for hand placement    Ambulation/Gait Ambulation/Gait assistance: Contact guard assist Gait Distance (Feet): 200 Feet Assistive device: Rolling walker (2 wheels) Gait Pattern/deviations: Step-through pattern, Decreased stride length, Trunk flexed Gait velocity: decreased but functional     General Gait Details: cues to stand straight, look up, and get closer to RW; was fatigued at end of walk  Stairs            Wheelchair Mobility     Tilt Bed    Modified Rankin (Stroke Patients Only)       Balance Overall balance assessment: Needs assistance Sitting-balance support: No upper extremity supported Sitting balance-Leahy Scale: Good     Standing balance support: Bilateral upper extremity supported Standing balance-Leahy Scale: Poor Standing balance comment: steady with RW                             Pertinent Vitals/Pain Pain Assessment Pain Assessment: No/denies pain    Home Living Family/patient expects to be discharged to:: Private residence Living Arrangements: Spouse/significant other Available Help at Discharge: Personal care attendant;Family;Available 24 hours/day Type of Home:  House Home Access: Stairs to enter   [employer] of Steps: 1   Home Layout: Multi-level;Able to live on main level with bedroom/bathroom Home Equipment: Rolling Walker (2 wheels);Rollator (4 wheels);BSC/3in1;Shower seat;Grab bars - tub/shower;Hand held shower head;Wheelchair - manual      Prior Function Prior Level of Function : Independent/Modified  Independent             Mobility Comments: Ambulates without RW most of the time, likes to get out and walk in driveway, will take w/c if going further, remains working some as Teacher, English as a foreign language of [employer], exercises at least 3 x week ADLs Comments: some support from PCA and wife for BADL's and transportation, Foley catheter management     Extremity/Trunk Assessment   Upper Extremity Assessment Upper Extremity Assessment: Overall WFL for tasks assessed (hx R shoulder pain)    Lower Extremity Assessment Lower Extremity Assessment: Overall WFL for tasks assessed    Cervical / Trunk Assessment Cervical / Trunk Assessment: Kyphotic  Communication        Cognition Arousal: Alert Behavior During Therapy: WFL for tasks assessed/performed   PT - Cognitive impairments: No apparent impairments                       PT - Cognition Comments: Very motivated to ambulate         Cueing       General Comments General comments (skin integrity, edema, etc.): Pt was on 3 L O2 with sats >99% during session    Exercises     Assessment/Plan    PT Assessment Patient needs continued PT services  PT Problem List Decreased strength;Decreased mobility;Decreased activity tolerance;Cardiopulmonary status limiting activity;Decreased balance;Decreased knowledge of use of DME       PT Treatment Interventions DME instruction;Therapeutic exercise;Gait training;Stair training;Functional mobility training;Therapeutic activities;Patient/family education;Balance training;Modalities    PT Goals (Current goals can be found in the Care Plan section)  Acute Rehab PT Goals Patient Stated Goal: return home PT Goal Formulation: With patient/family Time For Goal Achievement: 07/08/24 Potential to Achieve Goals: Good    Frequency Min 2X/week     Co-evaluation               AM-PAC PT 6 Clicks Mobility  Outcome Measure Help needed turning from your back to your side while in a flat  bed without using bedrails?: A Little Help needed moving from lying on your back to sitting on the side of a flat bed without using bedrails?: A Little Help needed moving to and from a bed to a chair (including a wheelchair)?: A Little Help needed standing up from a chair using your arms (e.g., wheelchair or bedside chair)?: A Little Help needed to walk in hospital room?: A Little Help needed climbing 3-5 steps with a railing? : A Little 6 Click Score: 18    End of Session Equipment Utilized During Treatment: Gait belt Activity Tolerance: Patient tolerated treatment well Patient left: in chair;with call bell/phone within reach;with family/visitor present Nurse Communication: Mobility status PT Visit Diagnosis: Other abnormalities of gait and mobility (R26.89);Muscle weakness (generalized) (M62.81)    Time: 8470-8398 PT Time Calculation (min) (ACUTE ONLY): 32 min   Charges:   PT Evaluation $PT Eval Moderate Complexity: 1 Mod PT Treatments $Gait Training: 8-22 mins PT General Charges $$ ACUTE PT VISIT: 1 Visit         Benjiman, PT Acute Rehab [facility] Rehab [phone]   Benjiman VEAR [place] 06/24/2024, 4:12  PM

## 2024-06-25 ENCOUNTER — Telehealth: Payer: Self-pay | Admitting: Internal Medicine

## 2024-06-25 DIAGNOSIS — T8361XD Infection and inflammatory reaction due to implanted penile prosthesis, subsequent encounter: Secondary | ICD-10-CM | POA: Diagnosis not present

## 2024-06-25 LAB — CBC
HCT: 30.1 % — ABNORMAL LOW (ref 39.0–52.0)
Hemoglobin: 9.7 g/dL — ABNORMAL LOW (ref 13.0–17.0)
MCH: 34.3 pg — ABNORMAL HIGH (ref 26.0–34.0)
MCHC: 32.2 g/dL (ref 30.0–36.0)
MCV: 106.4 fL — ABNORMAL HIGH (ref 80.0–100.0)
Platelets: 304 K/uL (ref 150–400)
RBC: 2.83 MIL/uL — ABNORMAL LOW (ref 4.22–5.81)
RDW: 18.2 % — ABNORMAL HIGH (ref 11.5–15.5)
WBC: 16 K/uL — ABNORMAL HIGH (ref 4.0–10.5)
nRBC: 0 % (ref 0.0–0.2)

## 2024-06-25 LAB — BASIC METABOLIC PANEL WITH GFR
Anion gap: 8 (ref 5–15)
BUN: 29 mg/dL — ABNORMAL HIGH (ref 8–23)
CO2: 26 mmol/L (ref 22–32)
Calcium: 7.7 mg/dL — ABNORMAL LOW (ref 8.9–10.3)
Chloride: 107 mmol/L (ref 98–111)
Creatinine, Ser: 1.13 mg/dL (ref 0.61–1.24)
GFR, Estimated: >60 mL/min (ref >60)
Glucose, Bld: 120 mg/dL — ABNORMAL HIGH (ref 70–99)
Potassium: 3.8 mmol/L (ref 3.5–5.1)
Sodium: 141 mmol/L (ref 135–145)

## 2024-06-25 MED ORDER — MORPHINE SULFATE (PF) 2 MG/ML IV SOLN
2.0000 mg | INTRAVENOUS | Status: DC | PRN
  Administered 2024-06-25 – 2024-06-26 (×3): 2 mg via INTRAVENOUS
  Filled 2024-06-25 (×3): qty 1

## 2024-06-25 MED ORDER — ORAL CARE MOUTH RINSE: 15.0000 mL | OROMUCOSAL | Status: DC | PRN

## 2024-06-25 NOTE — Telephone Encounter (Signed)
 [name] - please move PFT to late aug 2025 at time of office visit otherwise just give first avail

## 2024-06-25 NOTE — Progress Notes (Signed)
 Progress Note   Patient: [name] FMW:[id number] DOB: [birth year] DOA: 06/21/2024     4 DOS: the patient was seen and examined on 06/25/2024     Brief hospital course: [name] is a 89 year Randall with PMH of BPH with LUTS, urinary retention s/p suprapubic catheter, HLD, HTN, interstitial lung disease, anxiety and depression, polymyalgia rheumatica, paroxysmal A-fib on Eliquis , PE/DVT, AAA s/p stent graft in 2018, chronic diastolic HF, IDA and a recently diagnosed pneumonia who was directly admitted by urology for infected penile prosthesis.  TRH consulted to manage medical problems. On 06/23/2024 [facility]  hospitalist assumed primary  team as patient was transferred to stepdown after his surgery.   Assessment and Plan:   # Infected penile implant, status post removal Complicated UTI Patient was taken to the OR on 06/23/2024 by urology Underwent cystoscopy with complex catheter placement as well as removal of infected inflatable penile prosthesis Lots of  pus was drained and JP drain currently in place Urine culture is growing Citrobacter as well as Pseudomonas and Serratia are sensitive to ciprofloxacin  and cefepime  Vancomycin  and Zosyn  discontinued and cefepime  initiated based on sensitivity results We can transition patient to ciprofloxacin  at discharge to complete a total of 14 days of complicated UTI According to urology the drain will have to be removed before discharge  # Acute on chronic hypoxic respiratory failure # Interstitial lung disease # Community-acquired pneumonia - Diagnosed with LLL PNA by PCP on 7/21 and started on Augmentin and azithromycin  Uses 2 L of oxygen  at home - Repeat CXR shows resolution of the focal lung infiltrate around the penile implant site. - As needed DuoNebs - Incentive spirometer, flutter valve   Acute kidney injury-improved Renal function is around the baseline Monitor renal function   # Paroxysmal A-fib - Rate controlled, HR in the  80s - EKG shows sinus tach - Eliquis  resumed as agreed by urologist   # Polymyalgia rheumatica # Right shoulder pain - Follows with [facility], on prednisone  12.5 mg daily and methotrexate 12.5 mg weekly - Hold methotrexate in the setting of acute infection - Continue prednisone  and Lyrica    # IDA Monitor CBC   # Hx of DVT/PE Continue Eliquis    # Anxiety and depression - Continue mirtazapine  and lorazepam    # Insomnia - Continue melatonin and as needed zolpidem    # Hx of herpes ophthalmicus - Continue acyclovir  to prevent herpes flare   # AAA/ s/p Repair - Appears stable on recent CT   # GERD - Continue PPI   # BPH # Urinary retention s/p suprapubic catheter - Continue finasteride  and tamsulosin  - Foley management per primary team      Family Communication: Discussed plan with spouse and caretaker at bedside       Subjective:  Patient seen and evaluated at bedside today Mental status is much improved He did complain of above 7 out of 10 pain in the perineal region Denies nausea vomiting abdominal pain chest pain cough   Physical Exam: General: Pleasant, laying in bed in no acute distress on intranasal oxygen  HEENT: [name]/AT. Anicteric sclera CV: RRR. No murmurs, rubs, or gallops. No LE edema Pulmonary: On 2LNC. Lungs CTAB. Normal effort. Fine crackles. No wheezing or rhonchi. Abdominal: Soft, nontender, nondistended.  JP drain in place. Extremities: Palpable radial and DP pulses. Normal ROM. Skin: Warm and dry. No obvious rash or lesions. GU: Suprapubic catheter stable in place with clear yellowish urine in bag Neuro: A&Ox3. Moves all extremities. Normal sensation to light  touch. No focal deficit. Psych: Normal mood and affect     Disposition: Pending medical stabilization and clearance by urology before discharge. PT OT on board and recommending home   Data Reviewed:    Vitals:   06/24/24 2207 06/25/24 0437 06/25/24 0819 06/25/24 1209  BP:  111/68 118/69 117/67 (!) 97/59  Pulse: 75 65 75 80  Resp: 17 18 20 14   Temp: 97.6 F (36.4 C) 98.1 F (36.7 C) (!) 97.5 F (36.4 C) 98.4 F (36.9 C)  TempSrc: Oral Oral Oral Oral  SpO2: 100% 100% 100% 96%  Weight:      Height:          Latest Ref Rng & Units 06/25/2024    5:18 AM 06/24/2024   10:56 AM 06/23/2024    4:44 AM  CBC  WBC 4.0 - 10.5 K/uL 16.0  21.9  13.4   Hemoglobin 13.0 - 17.0 g/dL 9.7  89.8  9.7   Hematocrit 39.0 - 52.0 % 30.1  33.3  30.6   Platelets 150 - 400 K/uL 304  290  293        Latest Ref Rng & Units 06/25/2024    5:18 AM 06/24/2024   10:56 AM 06/23/2024    4:44 AM  BMP  Glucose 70 - 99 mg/dL 879  818  887   BUN 8 - 23 mg/dL 29  29  29    Creatinine 0.61 - 1.24 mg/dL 8.86  8.72  8.74   Sodium 135 - 145 mmol/L 141  138  138   Potassium 3.5 - 5.1 mmol/L 3.8  4.1  4.0   Chloride 98 - 111 mmol/L 107  107  106   CO2 22 - 32 mmol/L 26  22  26    Calcium  8.9 - 10.3 mg/dL 7.7  7.5  7.6       Author: Drue ONEIDA Potter, MD 06/25/2024 3:04 PM  For on call review [email or web address].

## 2024-06-25 NOTE — Progress Notes (Signed)
 Urology Inpatient Progress Report  Infected penile implant (HCC) [T83.61XA]  Procedure(s): EXPLORATION, SCROTUM CYSTOSCOPY, FLEXIBLE REMOVAL, PENILE PROSTHESIS  2 Days Post-Op   Intv/Subj: No acute events overnight.  Dose is improving.  Drain output 30 cc overnight.  Principal Problem:   Infected penile implant (HCC) Active Problems:   Interstitial lung disease (HCC)   Acute on chronic hypoxic respiratory failure (HCC)   Hx of polymyalgia rheumatica   Community acquired pneumonia of left lower lobe of lung  Current Facility-Administered Medications  Medication Dose Route Frequency Provider Last Rate Last Admin   acetaminophen  (TYLENOL ) tablet 1,000 mg  1,000 mg Oral Q6H Trivedi, Hersh M, MD   1,000 mg at 06/25/24 1214   acyclovir  (ZOVIRAX ) tablet 400 mg  400 mg Oral BID Amponsah, Prosper M, MD   400 mg at 06/25/24 9083   apixaban  (ELIQUIS ) tablet 2.5 mg  2.5 mg Oral BID Desai, Nikita S, MD   2.5 mg at 06/25/24 0914   ceFEPIme  (MAXIPIME ) 2 g in sodium chloride  0.9 % 100 mL IVPB  2 g Intravenous BID Ellington, Abby K, RPH 200 mL/hr at 06/25/24 0925 2 g at 06/25/24 0925   Chlorhexidine  Gluconate Cloth 2 % PADS 6 each  6 each Topical Daily Dorinda Drue DASEN, MD   6 each at 06/25/24 0824   docusate sodium  (COLACE) capsule 100 mg  100 mg Oral BID Trivedi, Hersh M, MD   100 mg at 06/24/24 2157   feeding supplement (ENSURE PLUS HIGH PROTEIN) liquid 237 mL  237 mL Oral TID BM Carolee Sherwood BIRCH III, MD   237 mL at 06/24/24 2009   finasteride  (PROSCAR ) tablet 5 mg  5 mg Oral Daily Amponsah, Prosper M, MD   5 mg at 06/25/24 9083   fluorometholone  (FML) 0.1 % ophthalmic suspension 1 drop  1 drop Right Eye QHS Carolee Sherwood BIRCH III, MD   1 drop at 06/24/24 2213   folic acid  (FOLVITE ) tablet 1 mg  1 mg Oral Daily Bell, Brittan Mapel D III, MD   1 mg at 06/25/24 0915   Gerhardt's butt cream   Topical BID Carolee Sherwood BIRCH DOUGLAS, MD   Given at 06/25/24 [phone]   HYDROcodone -acetaminophen  (NORCO/VICODIN) 5-325 MG per  tablet 1 tablet  1 tablet Oral Q6H PRN Lou Claretta HERO, MD   1 tablet at 06/25/24 0911   ipratropium-albuterol  (DUONEB) 0.5-2.5 (3) MG/3ML nebulizer solution 3 mL  3 mL Nebulization Q6H PRN Lou Claretta HERO, MD       LORazepam  (ATIVAN ) tablet 0.25 mg  0.25 mg Oral BID Carolee Sherwood BIRCH III, MD   0.25 mg at 06/25/24 9177   LORazepam  (ATIVAN ) tablet 0.5 mg  0.5 mg Oral QHS Amponsah, Prosper M, MD   0.5 mg at 06/24/24 2156   melatonin tablet 5 mg  5 mg Oral QHS Carolee Sherwood BIRCH III, MD   5 mg at 06/24/24 2157   mirtazapine  (REMERON ) tablet 30 mg  30 mg Oral QHS Amponsah, Prosper M, MD   30 mg at 06/24/24 2157   morphine  (PF) 2 MG/ML injection 2 mg  2 mg Intravenous Q3H PRN Dorinda Drue DASEN, MD   2 mg at 06/25/24 1215   multivitamin with minerals tablet 1 tablet  1 tablet Oral Daily Carolee Sherwood BIRCH III, MD   1 tablet at 06/25/24 0915   ondansetron  (ZOFRAN ) injection 4 mg  4 mg Intravenous Q4H PRN Trivedi, Hersh M, MD   4 mg at 06/25/24 1220   Oral care mouth  rinse  15 mL Mouth Rinse PRN Djan, Prince T, MD       pantoprazole  (PROTONIX ) EC tablet 40 mg  40 mg Oral Daily Carolee Sherwood BIRCH III, MD   40 mg at 06/25/24 0914   polyethylene glycol (MIRALAX  / GLYCOLAX ) packet 17 g  17 g Oral BID Amponsah, Prosper M, MD   17 g at 06/24/24 2157   predniSONE  (DELTASONE ) tablet 10 mg  10 mg Oral Q breakfast Carolee Sherwood BIRCH III, MD   10 mg at 06/25/24 9177   predniSONE  (DELTASONE ) tablet 2.5 mg  2.5 mg Oral Q breakfast Carolee Sherwood BIRCH III, MD   2.5 mg at 06/25/24 9177   pregabalin  (LYRICA ) capsule 75 mg  75 mg Oral Q24H Djan, Prince T, MD   75 mg at 06/24/24 2008   senna (SENOKOT) tablet 8.6 mg  1 tablet Oral BID Trivedi, Hersh M, MD   8.6 mg at 06/24/24 2157   zolpidem  (AMBIEN ) tablet 5 mg  5 mg Oral QHS PRN Carolee Sherwood BIRCH III, MD   5 mg at 06/24/24 2157     Objective: Vital: Vitals:   06/24/24 2207 06/25/24 0437 06/25/24 0819 06/25/24 1209  BP: 111/68 118/69 117/67 (!) 97/59  Pulse: 75 65 75 80  Resp: 17 18 20  14   Temp: 97.6 F (36.4 C) 98.1 F (36.7 C) (!) 97.5 F (36.4 C) 98.4 F (36.9 C)  TempSrc: Oral Oral Oral Oral  SpO2: 100% 100% 100% 96%  Weight:      Height:       I/Os: I/O last 3 completed shifts: In: 400 [IV Piggyback:400] Out: 2930 [Urine:2850; Drains:80]  Physical Exam:  General: Patient is in no apparent distress Lungs: Normal respiratory effort, chest expands symmetrically. GI: Incisions are c/d/i. The abdomen is soft and nontender without mass.  Superpubic tube draining clear yellow urine JP drain with serous drainage Foley: Capped in the urethra Ext: lower extremities symmetric  Lab Results: Recent Labs    06/23/24 0444 06/24/24 1056 06/25/24 0518  WBC 13.4* 21.9* 16.0*  HGB 9.7* 10.1* 9.7*  HCT 30.6* 33.3* 30.1*   Recent Labs    06/23/24 0444 06/24/24 1056 06/25/24 0518  NA 138 138 141  K 4.0 4.1 3.8  CL 106 107 107  CO2 26 22 26   GLUCOSE 112* 181* 120*  BUN 29* 29* 29*  CREATININE 1.25* 1.27* 1.13  CALCIUM  7.6* 7.5* 7.7*   No results for input(s): LABPT, INR in the last 72 hours. No results for input(s): LABURIN in the last 72 hours. Results for orders placed or performed during the hospital encounter of 06/21/24  Urine Culture     Status: Abnormal   Collection Time: 06/22/24  1:49 AM   Specimen: Urine, Clean Catch  Result Value Ref Range Status   Specimen Description   Final    URINE, CLEAN CATCH Performed at [facility], [address] [place], [place] [address]    Special Requests   Final    NONE Performed at [facility], [address], [place], [place] [address]    Culture (A)  Final    >=100,000 COLONIES/mL PSEUDOMONAS AERUGINOSA 70,000 COLONIES/mL CITROBACTER FREUNDII    Report Status 06/24/2024 FINAL  Final   Organism ID, Bacteria PSEUDOMONAS AERUGINOSA (A)  Final   Organism ID, Bacteria CITROBACTER FREUNDII (A)  Final      Susceptibility   Citrobacter freundii - MIC*     CEFEPIME  1 SENSITIVE Sensitive  CEFTRIAXONE  >=64 RESISTANT Resistant     CIPROFLOXACIN  <=0.25 SENSITIVE Sensitive     GENTAMICIN <=1 SENSITIVE Sensitive     IMIPENEM 1 SENSITIVE Sensitive     NITROFURANTOIN <=16 SENSITIVE Sensitive     TRIMETH /SULFA  <=20 SENSITIVE Sensitive     PIP/TAZO >=128 RESISTANT Resistant ug/mL    * 70,000 COLONIES/mL CITROBACTER FREUNDII   Pseudomonas aeruginosa - MIC*    CEFTAZIDIME <=1 SENSITIVE Sensitive     CIPROFLOXACIN  <=0.25 SENSITIVE Sensitive     GENTAMICIN <=1 SENSITIVE Sensitive     IMIPENEM 2 SENSITIVE Sensitive     PIP/TAZO <=4 SENSITIVE Sensitive ug/mL    CEFEPIME  2 SENSITIVE Sensitive     * >=100,000 COLONIES/mL PSEUDOMONAS AERUGINOSA  Aerobic/Anaerobic Culture w Gram Stain (surgical/deep wound)     Status: None (Preliminary result)   Collection Time: 06/23/24  9:05 AM   Specimen: Wound  Result Value Ref Range Status   Specimen Description   Final    WOUND Performed at [facility], [address], [place], [place] [address]    Special Requests   Final    NONE Performed at [facility], [address], [place], [place] [address]    Gram Stain   Final    FEW WBC PRESENT, PREDOMINANTLY PMN RARE GRAM NEGATIVE RODS Performed at [facility], [address], [place], [place] [address]    Culture MODERATE SERRATIA MARCESCENS  Final   Report Status PENDING  Incomplete   Organism ID, Bacteria SERRATIA MARCESCENS  Final      Susceptibility   Serratia marcescens - MIC*    CEFEPIME  <=0.12 SENSITIVE Sensitive     CEFTAZIDIME <=1 SENSITIVE Sensitive     CEFTRIAXONE  <=0.25 SENSITIVE Sensitive     CIPROFLOXACIN  <=0.25 SENSITIVE Sensitive     GENTAMICIN <=1 SENSITIVE Sensitive     TRIMETH /SULFA  <=20 SENSITIVE Sensitive     * MODERATE SERRATIA MARCESCENS  MRSA Next Gen by PCR, Nasal     Status: None   Collection Time: 06/23/24 11:45 AM   Specimen: Nasal Mucosa; Nasal Swab  Result Value Ref Range  Status   MRSA by PCR Next Gen NOT DETECTED NOT DETECTED Final    Comment: (NOTE) The GeneXpert MRSA Assay (FDA approved for NASAL specimens only), is one component of a comprehensive MRSA colonization surveillance program. It is not intended to diagnose MRSA infection nor to guide or monitor treatment for MRSA infections. Test performance is not FDA approved in patients less than 2 years old. Performed at [facility], [address] [place], [place] [address]     Studies/Results: No results found.  Assessment: Infected penile prosthesis  Procedure(s): EXPLORATION, SCROTUM CYSTOSCOPY, FLEXIBLE REMOVAL, PENILE PROSTHESIS, 2 Days Post-Op  doing well.  Plan: Continue cefepime .  Can likely transition to ciprofloxacin  when ready for discharge from medical standpoint.  Okay to restart Eliquis  from my standpoint.  Continue to work with physical therapy.  I will leave the drain in for now but needs to come out prior to discharge.   Sherwood Edison, MD Urology 06/25/2024, 2:45 PM

## 2024-06-25 NOTE — Progress Notes (Signed)
 Physical Therapy Treatment Patient Details Name: [name] MRN: [id number] DOB: May 12, 1930 Today's Date: 06/25/2024   History of Present Illness Pt is 89 yo male admitted 06/21/24 with infected penile implant.  Pt s/p cystoscopy, complex catheter placement, and removal of penile prosthesis on 06/23/24.  Pt with hx including but not limited to BPH with LUTS,  urinary retention s/p suprapubic catheter, HLD, HTN, interstitial lung disease, anxiety and depression, polymyalgia rheumatica, paroxysmal A-fib on Eliquis , PE/DVT, AAA s/p stent graft in 2018, chronic diastolic HF, IDA and a recently diagnosed pneumonia .    PT Comments  Pt motivated and making good progress. He was able to increase gait distance with rest breaks and cues for posture.  Tolerated well.  On 2 L O2 with VSS.  Continue POC.    If plan is discharge home, recommend the following: A little help with walking and/or transfers;Assistance with cooking/housework;A little help with bathing/dressing/bathroom;Help with stairs or ramp for entrance   Can travel by private vehicle        Equipment Recommendations  None recommended by PT    Recommendations for Other Services       Precautions / Restrictions Precautions Precautions: Fall Precaution/Restrictions Comments: JP drain     Mobility  Bed Mobility Overal bed mobility: Needs Assistance Bed Mobility: Supine to Sit     Supine to sit: Min assist          Transfers Overall transfer level: Needs assistance Equipment used: Rolling walker (2 wheels) Transfers: Sit to/from Stand Sit to Stand: Contact guard assist           General transfer comment: cues for hand placement; performed x 2    Ambulation/Gait Ambulation/Gait assistance: Contact guard assist Gait Distance (Feet): 150 Feet (150' then 275') Assistive device: Rolling walker (2 wheels) Gait Pattern/deviations: Step-through pattern, Decreased stride length, Trunk flexed Gait velocity: decreased  but functional     General Gait Details: cues to stand straight, look up, and get closer to RW; chair follow with 1 seated rest break   Stairs             Wheelchair Mobility     Tilt Bed    Modified Rankin (Stroke Patients Only)       Balance Overall balance assessment: Needs assistance Sitting-balance support: No upper extremity supported Sitting balance-Leahy Scale: Good     Standing balance support: Bilateral upper extremity supported Standing balance-Leahy Scale: Poor Standing balance comment: steady with RW                            Communication    Cognition Arousal: Alert Behavior During Therapy: WFL for tasks assessed/performed   PT - Cognitive impairments: Memory                       PT - Cognition Comments: Very motivated to ambulate        Cueing    Exercises      General Comments General comments (skin integrity, edema, etc.): Pt on 2 L O2 with sats 100% rest and 94% ambulation.  HR 110 bpm rest up to 127 bpm max walking      Pertinent Vitals/Pain Pain Assessment Pain Assessment: Faces Faces Pain Scale: Hurts a little bit Pain Location: groin Pain Descriptors / Indicators: Discomfort Pain Intervention(s): Limited activity within patient's tolerance, Monitored during session    Home Living  Prior Function            PT Goals (current goals can now be found in the care plan section) Progress towards PT goals: Progressing toward goals    Frequency    Min 2X/week      PT Plan      Co-evaluation              AM-PAC PT 6 Clicks Mobility   Outcome Measure  Help needed turning from your back to your side while in a flat bed without using bedrails?: A Little Help needed moving from lying on your back to sitting on the side of a flat bed without using bedrails?: A Little Help needed moving to and from a bed to a chair (including a wheelchair)?: A Little Help  needed standing up from a chair using your arms (e.g., wheelchair or bedside chair)?: A Little Help needed to walk in hospital room?: A Little Help needed climbing 3-5 steps with a railing? : A Little 6 Click Score: 18    End of Session Equipment Utilized During Treatment: Gait belt Activity Tolerance: Patient tolerated treatment well Patient left: in chair;with call bell/phone within reach;with family/visitor present Nurse Communication: Mobility status PT Visit Diagnosis: Other abnormalities of gait and mobility (R26.89);Muscle weakness (generalized) (M62.81)     Time: 8478-8452 PT Time Calculation (min) (ACUTE ONLY): 26 min  Charges:    $Gait Training: 23-37 mins PT General Charges $$ ACUTE PT VISIT: 1 Visit                     Randall, PT Acute Rehab [facility] [phone]    [name] 06/25/2024, 3:58 PM

## 2024-06-25 NOTE — Plan of Care (Signed)

## 2024-06-26 DIAGNOSIS — T8361XD Infection and inflammatory reaction due to implanted penile prosthesis, subsequent encounter: Secondary | ICD-10-CM | POA: Diagnosis not present

## 2024-06-26 DIAGNOSIS — Z8739 Personal history of other diseases of the musculoskeletal system and connective tissue: Secondary | ICD-10-CM | POA: Diagnosis not present

## 2024-06-26 DIAGNOSIS — J9621 Acute and chronic respiratory failure with hypoxia: Secondary | ICD-10-CM | POA: Diagnosis not present

## 2024-06-26 DIAGNOSIS — J849 Interstitial pulmonary disease, unspecified: Secondary | ICD-10-CM | POA: Diagnosis not present

## 2024-06-26 MED ORDER — OXYCODONE HCL 5 MG PO TABS: 5.0000 mg | ORAL_TABLET | ORAL | 0 refills | Status: DC | PRN

## 2024-06-26 MED ORDER — CIPROFLOXACIN HCL 500 MG PO TABS: 500.0000 mg | ORAL_TABLET | Freq: Two times a day (BID) | ORAL | 0 refills | Status: DC

## 2024-06-26 MED ORDER — CIPROFLOXACIN HCL 500 MG PO TABS
500.0000 mg | ORAL_TABLET | Freq: Two times a day (BID) | ORAL | Status: DC
  Administered 2024-06-26: 500 mg via ORAL
  Filled 2024-06-26: qty 1

## 2024-06-26 MED ORDER — SENNA 8.6 MG PO TABS: 1.0000 | ORAL_TABLET | Freq: Every day | ORAL | 0 refills | Status: DC | PRN

## 2024-06-26 MED ORDER — OXYCODONE HCL 5 MG PO TABS
5.0000 mg | ORAL_TABLET | ORAL | Status: DC | PRN
  Administered 2024-06-26: 5 mg via ORAL
  Filled 2024-06-26: qty 1

## 2024-06-26 NOTE — Progress Notes (Signed)
 Notified to patient that I need to do sacrum dressing change and he stated that he wants it change later in the morning. RN told the patient that I have the time to do it but he insisted to do it later in the morning.

## 2024-06-26 NOTE — Telephone Encounter (Signed)
 PT pft has been rescheduled.

## 2024-06-26 NOTE — Plan of Care (Signed)
   Problem: Clinical Measurements: Goal: Will remain free from infection Outcome: Progressing   Problem: Clinical Measurements: Goal: Diagnostic test results will improve Outcome: Progressing   Problem: Clinical Measurements: Goal: Respiratory complications will improve Outcome: Progressing   Problem: Clinical Measurements: Goal: Cardiovascular complication will be avoided Outcome: Progressing

## 2024-06-26 NOTE — Progress Notes (Signed)
 Urology Inpatient Progress Report  Infected penile implant (HCC) [T83.61XA]  Procedure(s): EXPLORATION, SCROTUM CYSTOSCOPY, FLEXIBLE REMOVAL, PENILE PROSTHESIS  3 Days Post-Op   Intv/Subj: No acute events overnight. Patient is having some postoperative pain despite Norco.  Principal Problem:   Infected penile implant (HCC) Active Problems:   Interstitial lung disease (HCC)   Acute on chronic hypoxic respiratory failure (HCC)   Hx of polymyalgia rheumatica   Community acquired pneumonia of left lower lobe of lung  Current Facility-Administered Medications  Medication Dose Route Frequency Provider Last Rate Last Admin   acetaminophen  (TYLENOL ) tablet 1,000 mg  1,000 mg Oral Q6H Trivedi, Hersh M, MD   1,000 mg at 06/26/24 0612   acyclovir  (ZOVIRAX ) tablet 400 mg  400 mg Oral BID Amponsah, Prosper M, MD   400 mg at 06/26/24 9056   apixaban  (ELIQUIS ) tablet 2.5 mg  2.5 mg Oral BID Desai, Nikita S, MD   2.5 mg at 06/26/24 0932   ceFEPIme  (MAXIPIME ) 2 g in sodium chloride  0.9 % 100 mL IVPB  2 g Intravenous BID Ellington, Abby K, [facility]   Stopped at 06/26/24 0945   Chlorhexidine  Gluconate Cloth 2 % PADS 6 each  6 each Topical Daily Dorinda Drue DASEN, MD   6 each at 06/26/24 0933   docusate sodium  (COLACE) capsule 100 mg  100 mg Oral BID Trivedi, Hersh M, MD   100 mg at 06/26/24 0931   feeding supplement (ENSURE PLUS HIGH PROTEIN) liquid 237 mL  237 mL Oral TID BM Carolee Sherwood BIRCH III, MD   237 mL at 06/26/24 9066   finasteride  (PROSCAR ) tablet 5 mg  5 mg Oral Daily Amponsah, Prosper M, MD   5 mg at 06/26/24 9068   fluorometholone  (FML) 0.1 % ophthalmic suspension 1 drop  1 drop Right Eye QHS Carolee Sherwood BIRCH III, MD   1 drop at 06/25/24 2122   folic acid  (FOLVITE ) tablet 1 mg  1 mg Oral Daily Bell, Shoshanah Dapper D III, MD   1 mg at 06/26/24 9068   Gerhardt's butt cream   Topical BID Carolee Sherwood BIRCH III, MD   Given at 06/26/24 0933   ipratropium-albuterol  (DUONEB) 0.5-2.5 (3) MG/3ML nebulizer solution 3 mL   3 mL Nebulization Q6H PRN Lou Claretta HERO, MD       LORazepam  (ATIVAN ) tablet 0.25 mg  0.25 mg Oral BID Carolee Sherwood BIRCH III, MD   0.25 mg at 06/26/24 9068   LORazepam  (ATIVAN ) tablet 0.5 mg  0.5 mg Oral QHS Amponsah, Prosper M, MD   0.5 mg at 06/25/24 2123   melatonin tablet 5 mg  5 mg Oral QHS Carolee Sherwood BIRCH III, MD   5 mg at 06/25/24 2123   mirtazapine  (REMERON ) tablet 30 mg  30 mg Oral QHS Amponsah, Prosper M, MD   30 mg at 06/25/24 2124   morphine  (PF) 2 MG/ML injection 2 mg  2 mg Intravenous Q3H PRN Dorinda Drue DASEN, MD   2 mg at 06/26/24 0927   multivitamin with minerals tablet 1 tablet  1 tablet Oral Daily Carolee Sherwood BIRCH III, MD   1 tablet at 06/26/24 0931   ondansetron  (ZOFRAN ) injection 4 mg  4 mg Intravenous Q4H PRN Trivedi, Hersh M, MD   4 mg at 06/25/24 1220   Oral care mouth rinse  15 mL Mouth Rinse PRN Dorinda Drue DASEN, MD       oxyCODONE  (Oxy IR/ROXICODONE ) immediate release tablet 5 mg  5 mg Oral Q3H PRN Carolee Sherwood  D III, MD       pantoprazole  (PROTONIX ) EC tablet 40 mg  40 mg Oral Daily Carolee Sherwood BIRCH III, MD   40 mg at 06/26/24 0931   polyethylene glycol (MIRALAX  / GLYCOLAX ) packet 17 g  17 g Oral BID Amponsah, Prosper M, MD   17 g at 06/25/24 2120   predniSONE  (DELTASONE ) tablet 10 mg  10 mg Oral Q breakfast Carolee Sherwood BIRCH III, MD   10 mg at 06/26/24 9068   predniSONE  (DELTASONE ) tablet 2.5 mg  2.5 mg Oral Q breakfast Carolee Sherwood BIRCH III, MD   2.5 mg at 06/26/24 0932   pregabalin  (LYRICA ) capsule 75 mg  75 mg Oral Q24H Djan, Prince T, MD   75 mg at 06/25/24 2023   senna (SENOKOT) tablet 8.6 mg  1 tablet Oral BID Trivedi, Hersh M, MD   8.6 mg at 06/26/24 0932   zolpidem  (AMBIEN ) tablet 5 mg  5 mg Oral QHS PRN Carolee Sherwood BIRCH III, MD   5 mg at 06/25/24 2123     Objective: Vital: Vitals:   06/25/24 0819 06/25/24 1209 06/25/24 2106 06/26/24 0544  BP: 117/67 (!) 97/59 124/73 (!) 141/81  Pulse: 75 80 72 69  Resp: 20 14 18 18   Temp: (!) 97.5 F (36.4 C) 98.4 F (36.9 C) 97.8  F (36.6 C) 98.2 F (36.8 C)  TempSrc: Oral Oral Oral Oral  SpO2: 100% 96% 100% 100%  Weight:      Height:       I/Os: I/O last 3 completed shifts: In: 1040 [P.O.:840; IV Piggyback:200] Out: 3900 [Urine:3800; Drains:100]  Physical Exam:  General: Patient is in no apparent distress Lungs: Normal respiratory effort, chest expands symmetrically. GI: Incisions are c/d/i.  The abdomen is soft and nontender without mass.  Mild tenderness to palpation overlying the infrapubic incision. JP drain with serous drainage Foley: Suprapubic tube draining clear yellow urine.  Urethral Foley catheter capped Ext: lower extremities symmetric  Lab Results: Recent Labs    06/24/24 1056 06/25/24 0518  WBC 21.9* 16.0*  HGB 10.1* 9.7*  HCT 33.3* 30.1*   Recent Labs    06/24/24 1056 06/25/24 0518  NA 138 141  K 4.1 3.8  CL 107 107  CO2 22 26  GLUCOSE 181* 120*  BUN 29* 29*  CREATININE 1.27* 1.13  CALCIUM  7.5* 7.7*   No results for input(s): LABPT, INR in the last 72 hours. No results for input(s): LABURIN in the last 72 hours. Results for orders placed or performed during the hospital encounter of 06/21/24  Urine Culture     Status: Abnormal   Collection Time: 06/22/24  1:49 AM   Specimen: Urine, Clean Catch  Result Value Ref Range Status   Specimen Description   Final    URINE, CLEAN CATCH Performed at [facility], [address], [place], [place] [address]    Special Requests   Final    NONE Performed at [facility], [address] [place], [place] [address]    Culture (A)  Final    >=100,000 COLONIES/mL PSEUDOMONAS AERUGINOSA 70,000 COLONIES/mL CITROBACTER FREUNDII    Report Status 06/24/2024 FINAL  Final   Organism ID, Bacteria PSEUDOMONAS AERUGINOSA (A)  Final   Organism ID, Bacteria CITROBACTER FREUNDII (A)  Final      Susceptibility   Citrobacter freundii - MIC*    CEFEPIME  1 SENSITIVE Sensitive     CEFTRIAXONE  >=64  RESISTANT Resistant     CIPROFLOXACIN  <=  0.25 SENSITIVE Sensitive     GENTAMICIN <=1 SENSITIVE Sensitive     IMIPENEM 1 SENSITIVE Sensitive     NITROFURANTOIN <=16 SENSITIVE Sensitive     TRIMETH /SULFA  <=20 SENSITIVE Sensitive     PIP/TAZO >=128 RESISTANT Resistant ug/mL    * 70,000 COLONIES/mL CITROBACTER FREUNDII   Pseudomonas aeruginosa - MIC*    CEFTAZIDIME <=1 SENSITIVE Sensitive     CIPROFLOXACIN  <=0.25 SENSITIVE Sensitive     GENTAMICIN <=1 SENSITIVE Sensitive     IMIPENEM 2 SENSITIVE Sensitive     PIP/TAZO <=4 SENSITIVE Sensitive ug/mL    CEFEPIME  2 SENSITIVE Sensitive     * >=100,000 COLONIES/mL PSEUDOMONAS AERUGINOSA  Aerobic/Anaerobic Culture w Gram Stain (surgical/deep wound)     Status: None (Preliminary result)   Collection Time: 06/23/24  9:05 AM   Specimen: Wound  Result Value Ref Range Status   Specimen Description   Final    WOUND Performed at [facility], [address], [place], [place] [address]    Special Requests   Final    NONE Performed at [facility], [address] [place], [place] [address]    Gram Stain   Final    FEW WBC PRESENT, PREDOMINANTLY PMN RARE GRAM NEGATIVE RODS Performed at [facility], [address] [place], [place] [address]    Culture MODERATE SERRATIA MARCESCENS  Final   Report Status PENDING  Incomplete   Organism ID, Bacteria SERRATIA MARCESCENS  Final      Susceptibility   Serratia marcescens - MIC*    CEFEPIME  <=0.12 SENSITIVE Sensitive     CEFTAZIDIME <=1 SENSITIVE Sensitive     CEFTRIAXONE  <=0.25 SENSITIVE Sensitive     CIPROFLOXACIN  <=0.25 SENSITIVE Sensitive     GENTAMICIN <=1 SENSITIVE Sensitive     TRIMETH /SULFA  <=20 SENSITIVE Sensitive     * MODERATE SERRATIA MARCESCENS  MRSA Next Gen by PCR, Nasal     Status: None   Collection Time: 06/23/24 11:45 AM   Specimen: Nasal Mucosa; Nasal Swab  Result Value Ref Range Status   MRSA by PCR Next Gen NOT DETECTED NOT DETECTED  Final    Comment: (NOTE) The GeneXpert MRSA Assay (FDA approved for NASAL specimens only), is one component of a comprehensive MRSA colonization surveillance program. It is not intended to diagnose MRSA infection nor to guide or monitor treatment for MRSA infections. Test performance is not FDA approved in patients less than 17 years old. Performed at [facility], [address] [place], [place] [address]     Studies/Results: No results found.  Assessment: Infected inflatable penile prosthesis  Procedure(s): EXPLORATION, SCROTUM CYSTOSCOPY, FLEXIBLE REMOVAL, PENILE PROSTHESIS, 3 Days Post-Op  doing well.  Plan: Okay to transition to ciprofloxacin  for 2 weeks.  JP drain can be removed.  I placed the order for nursing to remove.  Continue suprapubic tube to drainage and urethral Foley catheter capped.  Okay for discharge from my standpoint.  He will need follow-up in clinic in a couple of weeks for staple removal.   Sherwood Edison, MD Urology 06/26/2024, 10:08 AM

## 2024-06-26 NOTE — Discharge Summary (Signed)
 Physician Discharge Summary   Patient: [name] DOB: Apr 17, 1930  Admit date:     06/21/2024  Discharge date: 06/26/24  Discharge Physician: Sabas GORMAN Brod   PCP: Shayne Anes, MD   Recommendations at discharge:   Follow-up urology as outpatient Follow-up PCP in 1 week  Discharge Diagnoses: Principal Problem:   Infected penile implant (HCC) Active Problems:   Interstitial lung disease (HCC)   Acute on chronic hypoxic respiratory failure (HCC)   Hx of polymyalgia rheumatica   Community acquired pneumonia of left lower lobe of lung  Resolved Problems:   * No resolved hospital problems. *  Hospital Course: 89 year old with PMH of BPH with LUTS, urinary retention s/p suprapubic catheter, HLD, HTN, interstitial lung disease, anxiety and depression, polymyalgia rheumatica, paroxysmal A-fib on Eliquis , PE/DVT, AAA s/p stent graft in 2018, chronic diastolic HF, IDA and a recently diagnosed pneumonia who was directly admitted by urology for infected penile prosthesis.  [facility] consulted to manage medical problems. On 06/23/2024 [facility]  hospitalist assumed primary  team as patient was transferred to stepdown after his surgery.    Assessment and Plan:  # Infected penile implant, status post removal Complicated UTI Patient was taken to the OR on 06/23/2024 by urology Underwent cystoscopy with complex catheter placement as well as removal of infected inflatable penile prosthesis Lots of  pus was drained and JP drain currently in place Urine culture is growing Citrobacter as well as Pseudomonas and Serratia are sensitive to ciprofloxacin  and cefepime  Vancomycin  and Zosyn  discontinued and cefepime  initiated based on sensitivity results We can transition patient to ciprofloxacin  at discharge to complete a total of 14 days of complicated UTI Drain was removed today -Okay to discharge home on Cipro  500 mg p.o. twice daily for 14 days as per urology -Follow-up Dr. Carolee as  outpatient in 2 weeks   # Acute on chronic hypoxic respiratory failure # Interstitial lung disease # Community-acquired pneumonia - Diagnosed with LLL PNA by PCP on 7/21 and started on Augmentin and azithromycin  Uses 2 L of oxygen  at home - Repeat CXR shows resolution of the focal lung infiltrate around the penile implant site. - As needed DuoNebs - Incentive spirometer, flutter valve -Currently oxygen  back to baseline   Acute kidney injury-improved Renal function is around the baseline    # Paroxysmal A-fib - Rate controlled, HR in the 80s - EKG shows sinus tach - Eliquis  resumed as agreed by urologist   # Polymyalgia rheumatica # Right shoulder pain - Follows with [facility]ology, on prednisone  12.5 mg daily and methotrexate 12.5 mg weekly - Continue prednisone  and Lyrica  -Methotrexate was held, can be resumed at discharge   # IDA Monitor CBC   # Hx of DVT/PE Continue Eliquis    # Anxiety and depression - Continue mirtazapine  and lorazepam    # Insomnia - Continue melatonin and as needed zolpidem    # Hx of herpes ophthalmicus - Continue acyclovir  to prevent herpes flare   # AAA/ s/p Repair - Appears stable on recent CT   # GERD - Continue PPI   # BPH # Urinary retention s/p suprapubic catheter - Continue finasteride  and tamsulosin  - Foley management per urology - Patient to follow-up with urology as outpatient       Consultants: Urology Procedures performed: Status post removal of infected penile prosthesis Disposition: Home Diet recommendation:  Regular diet DISCHARGE MEDICATION: Allergies as of 06/26/2024       Reactions   Daptomycin  Other (See Comments)   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   Denosumab  Other (See Comments)   PROLIA  - Bad reaction 12/2023- will not receive this again    Rosuvastatin  Other (See Comments)   Stopped taking due to feeling achy     Doxycycline  Other (See Comments)   Upset stomach, currently taking and tolerating  well (??)   Morphine  And Codeine Nausea And Vomiting   Valium [diazepam] Other (See Comments)   Caused TOO MUCH sedation        Medication List     STOP taking these medications    cefdinir 300 MG capsule Commonly known as: OMNICEF   HYDROcodone -acetaminophen  5-325 MG tablet Commonly known as: NORCO/VICODIN   ondansetron  4 MG tablet Commonly known as: ZOFRAN    pantoprazole  40 MG tablet Commonly known as: PROTONIX    promethazine  12.5 MG tablet Commonly known as: PHENERGAN        TAKE these medications    Acetaminophen  500 MG capsule Take 500-1,000 mg by mouth See admin instructions. Tylenol  Rapid Release 500 mg capsules- Take 500-1,000 mg by mouth every eight hours as needed for pain What changed: Another medication with the same name was removed. Continue taking this medication, and follow the directions you see here.   acyclovir  400 MG tablet Commonly known as: ZOVIRAX  Take 400 mg by mouth 2 (two) times daily.   apixaban  2.5 MG Tabs tablet Commonly known as: ELIQUIS  Take 2.5 mg by mouth 2 (two) times daily.   butalbital -acetaminophen -caffeine  50-325-40 MG tablet Commonly known as: FIORICET  Take 1 tablet by mouth daily as needed for headache.   cholecalciferol 25 MCG (1000 UNIT) tablet Commonly known as: VITAMIN D3 Take 2,000 Units by mouth daily.   ciprofloxacin  500 MG tablet Commonly known as: CIPRO  Take 1 tablet (500 mg total) by mouth 2 (two) times daily for 14 days.   Colace 100 MG capsule Generic drug: docusate sodium  Take 100 mg by mouth at bedtime.   diclofenac  Sodium 1 % Gel Commonly known as: VOLTAREN  Apply 2 g topically 4 (four) times daily as needed (shoulder pain).   dicyclomine  10 MG/5ML solution Commonly known as: BENTYL  Take 20 mg by mouth 3 (three) times daily as needed (for cramping or abdominal pain).   feeding supplement Liqd Take 237 mLs by mouth 3 (three) times daily between meals.   finasteride  5 MG tablet Commonly known  as: PROSCAR  Take 5 mg by mouth daily.   fluorometholone  0.1 % ophthalmic suspension Commonly known as: FML Place 1 drop into the right eye at bedtime.   folic acid  1 MG tablet Commonly known as: FOLVITE  Take 1 mg by mouth daily.   lansoprazole 30 MG capsule Commonly known as: PREVACID Take 30 mg by mouth 2 (two) times daily before a meal.   lidocaine  5 % Commonly known as: LIDODERM  Place 1 patch onto the skin daily as needed (for pain- Remove & Discard patch within 12 hours or as directed by MD).   LORazepam  0.5 MG tablet Commonly known as: ATIVAN  Take 0.5 mg by mouth See admin instructions. Take 0.5 mg by mouth at bedtime and an additional 0.5 mg up to twice a day as needed for anxiety   melatonin 5 MG Tabs Take 1 tablet (5 mg total) by mouth at bedtime.   methotrexate 2.5 MG tablet Commonly known as: RHEUMATREX Take 12.5 mg by mouth every Friday.   mirtazapine  15 MG tablet Commonly known as: REMERON  Take 30 mg by mouth at bedtime.   MULTIVITAMIN PO Take 1 tablet by mouth daily.  oxyCODONE  5 MG immediate release tablet Commonly known as: Oxy IR/ROXICODONE  Take 1 tablet (5 mg total) by mouth every 4 (four) hours as needed for moderate pain (pain score 4-6).   polyethylene glycol 17 g packet Commonly known as: MIRALAX  / GLYCOLAX  Take 17 g by mouth in the morning and at bedtime.   predniSONE  2.5 MG tablet Commonly known as: DELTASONE  Take 2.5 mg by mouth daily with breakfast.   predniSONE  10 MG tablet Commonly known as: DELTASONE  Take 10 mg by mouth daily with breakfast.   pregabalin  75 MG capsule Commonly known as: LYRICA  Take 75 mg by mouth at bedtime.   senna 8.6 MG Tabs tablet Commonly known as: SENOKOT Take 1 tablet (8.6 mg total) by mouth daily as needed for mild constipation.   Senna-S 8.6-50 MG tablet Generic drug: senna-docusate Take 1 tablet by mouth in the morning.   tamsulosin  0.4 MG Caps capsule Commonly known as: FLOMAX  Take 0.8 mg by  mouth at bedtime.   zolpidem  5 MG tablet Commonly known as: AMBIEN  Take 5 mg by mouth at bedtime.        Follow-up Information     [name], MD. Schedule an appointment as soon as possible for a visit.   Specialty: Urology Contact information: [address] [place] [address] [phone]         Shayne Anes, MD Follow up in 1 week(s).   Specialty: Internal Medicine Contact information: [address] [place] [address] [phone]                Discharge Exam: Fredricka Weights   06/21/24 1803 06/23/24 1140  Weight: 80.7 kg 83.8 kg   General-appears in no acute distress Heart-S1-S2, regular, no murmur auscultated Lungs-clear to auscultation bilaterally, no wheezing or crackles auscultated Abdomen-soft, nontender, no organomegaly Extremities-no edema in the lower extremities Neuro-alert, oriented x3, no focal deficit noted  Condition at discharge: good  The results of significant diagnostics from this hospitalization (including imaging, microbiology, ancillary and laboratory) are listed below for reference.   Imaging Studies: CT Chest High Resolution Result Date: 06/24/2024 CLINICAL DATA:  Diffuse interstitial lung disease EXAM: CT CHEST WITHOUT CONTRAST TECHNIQUE: Multidetector CT imaging of the chest was performed following the standard protocol without intravenous contrast. High resolution imaging of the lungs, as well as inspiratory and expiratory imaging, was performed. RADIATION DOSE REDUCTION: This exam was performed according to the departmental dose-optimization program which includes automated exposure control, adjustment of the mA and/or kV according to patient size and/or use of iterative reconstruction technique. COMPARISON:  Chest CT January 08, 2024 FINDINGS: Cardiovascular: Aneurysmal dilation of ascending aorta up to 4.2 cm. Aneurysmal dilation of proximal descending aorta measuring up to 5.4 cm. Atherosclerotic  calcifications of aorta, aortic annulus and coronary arteries. The heart size is enlarged. No pericardial effusion. Mediastinum/Nodes: Multiple subcentimeter bilateral paratracheal lymph nodes. Mildly patulous esophagus. Lungs/Pleura: Again seen there are peripheral and lower lobe predominant subpleural reticulation, interlobular septal thickening, mild architectural distortion, and bronchiectasis/bronchiolectasis with dendritic ossifications. Questionable honeycombing is forming in posterior right upper lobe. No significant mosaic ground-glass attenuation to suggest air trapping. No new suspicious pulmonary nodule. Diffuse bronchial and bronchiolar wall thickening. Bilateral small pleural effusions, stable to prior. Upper Abdomen: Multiple hypodense liver lesions similar to prior. Gallbladder is distended. Small hiatal hernia. Hypodense right kidney cortical lesion, incompletely included in the field of view likely a cortical simple degenerative changes cyst. Musculoskeletal: Degenerative changes of the spine. Small lipoma in left  rhomboid muscle. IMPRESSION: Interstitial lung disease most consistent with UIP pattern, stable to prior. No new or suspicious pulmonary nodule. Recommend follow-up to ensure stability. Small bilateral pleural effusions increased to prior. Subjacent atelectasis of both lung base with dendritic ossifications likely post aspiration , infectious/inflammatory more conspicuous to prior. Ascending and descending thoracic aorta aneurysmal dilations, stable to prior. Recommend semiannual imaging follow-up by CTA or MRA. Aortic Atherosclerosis (ICD10-I70.0). Electronically Signed   By: Megan  Zare M.D.   On: 06/24/2024 13:29   ECHOCARDIOGRAM COMPLETE Result Date: 06/23/2024    ECHOCARDIOGRAM REPORT   Patient Name:   [name] Date of Exam: 06/23/2024 Medical Rec #:  Alexander      Height:       72.0 in Accession #:    [id number]     Weight:       184.7 lb Date of Birth:  1930-09-05     BSA:           2.060 m Patient Age:    89 years       BP:           105/49 mmHg Patient Gender: M              HR:           70 bpm. Exam Location:  Inpatient Procedure: 2D Echo, Color Doppler and Cardiac Doppler (Both Spectral and Color            Flow Doppler were utilized during procedure). Indications:    Acutre respiratory distress R06.03  History:        Patient has no prior history of Echocardiogram examinations,                 most recent 08/28/2023.  Sonographer:    Benard Stallion Referring Phys: [id number] MURALI RAMASWAMY IMPRESSIONS  1. Left ventricular ejection fraction, by estimation, is 55 to 60%. The left ventricle has normal function. The left ventricle has no regional wall motion abnormalities. Left ventricular diastolic parameters are indeterminate.  2. Right ventricular systolic function is normal. The right ventricular size is normal. There is normal pulmonary artery systolic pressure.  3. The mitral valve is grossly normal. Trivial mitral valve regurgitation. No evidence of mitral stenosis.  4. The aortic valve was not well visualized. Aortic valve regurgitation is not visualized. Mild aortic valve stenosis. Aortic valve area, by VTI measures 1.61 cm. Aortic valve mean gradient measures 8.0 mmHg. Aortic valve Vmax measures 1.94 m/s.  5. The inferior vena cava is normal in size with greater than 50% respiratory variability, suggesting right atrial pressure of 3 mmHg. Comparison(s): No significant change from prior study. FINDINGS  Left Ventricle: Left ventricular ejection fraction, by estimation, is 55 to 60%. The left ventricle has normal function. The left ventricle has no regional wall motion abnormalities. The left ventricular internal cavity size was normal in size. There is  no left ventricular hypertrophy. Left ventricular diastolic parameters are indeterminate. Right Ventricle: The right ventricular size is normal. No increase in right ventricular wall thickness. Right ventricular systolic  function is normal. There is normal pulmonary artery systolic pressure. The tricuspid regurgitant velocity is 2.28 m/s, and  with an assumed right atrial pressure of 3 mmHg, the estimated right ventricular systolic pressure is 23.8 mmHg. Left Atrium: Left atrial size was normal in size. Right Atrium: Right atrial size was normal in size. Pericardium: There is no evidence of pericardial effusion. Presence of epicardial fat layer. Mitral Valve: The mitral valve  is grossly normal. Trivial mitral valve regurgitation. No evidence of mitral valve stenosis. Tricuspid Valve: The tricuspid valve is grossly normal. Tricuspid valve regurgitation is trivial. No evidence of tricuspid stenosis. Aortic Valve: The aortic valve was not well visualized. Aortic valve regurgitation is not visualized. Mild aortic stenosis is present. Aortic valve mean gradient measures 8.0 mmHg. Aortic valve peak gradient measures 15.0 mmHg. Aortic valve area, by VTI measures 1.61 cm. Pulmonic Valve: The pulmonic valve was grossly normal. Pulmonic valve regurgitation is not visualized. No evidence of pulmonic stenosis. Aorta: The aortic root is normal in size and structure. Venous: The inferior vena cava is normal in size with greater than 50% respiratory variability, suggesting right atrial pressure of 3 mmHg. IAS/Shunts: The atrial septum is grossly normal.  LEFT VENTRICLE PLAX 2D LVIDd:         3.90 cm   Diastology LVIDs:         2.80 cm   LV e' medial:    5.77 cm/s LV PW:         1.00 cm   LV E/e' medial:  14.5 LV IVS:        1.00 cm   LV e' lateral:   7.62 cm/s LVOT diam:     2.40 cm   LV E/e' lateral: 11.0 LV SV:         68 LV SV Index:   33 LVOT Area:     4.52 cm  RIGHT VENTRICLE RV Basal diam:  3.70 cm RV Mid diam:    2.70 cm RV S prime:     12.40 cm/s TAPSE (M-mode): 2.4 cm LEFT ATRIUM             Index        RIGHT ATRIUM           Index LA diam:        3.60 cm 1.75 cm/m   RA Area:     20.70 cm LA Vol (A2C):   83.0 ml 40.29 ml/m  RA  Volume:   59.00 ml  28.64 ml/m LA Vol (A4C):   38.5 ml 18.69 ml/m LA Biplane Vol: 56.9 ml 27.62 ml/m  AORTIC VALVE AV Area (Vmax):    1.63 cm AV Area (Vmean):   1.58 cm AV Area (VTI):     1.61 cm AV Vmax:           193.67 cm/s AV Vmean:          132.333 cm/s AV VTI:            0.422 m AV Peak Grad:      15.0 mmHg AV Mean Grad:      8.0 mmHg LVOT Vmax:         69.80 cm/s LVOT Vmean:        46.300 cm/s LVOT VTI:          0.150 m LVOT/AV VTI ratio: 0.36  AORTA Ao Root diam: 3.30 cm MITRAL VALVE               TRICUSPID VALVE MV Area (PHT): 3.99 cm    TR Peak grad:   20.8 mmHg MV Decel Time: 190 msec    TR Vmax:        228.00 cm/s MV E velocity: 83.60 cm/s MV A velocity: 67.30 cm/s  SHUNTS MV E/A ratio:  1.24        Systemic VTI:  0.15 m  Systemic Diam: 2.40 cm Darryle Decent MD Electronically signed by Darryle Decent MD Signature Date/Time: 06/23/2024/12:50:27 PM    Final    CT ABDOMEN PELVIS WO CONTRAST Result Date: 06/22/2024 CLINICAL DATA:  Abdominal pain, postoperative sepsis. Diffuse interstitial lung disease. EXAM: CT ABDOMEN AND PELVIS WITHOUT CONTRAST TECHNIQUE: Multidetector CT imaging of the abdomen and pelvis was performed following the standard protocol without IV contrast. RADIATION DOSE REDUCTION: This exam was performed according to the departmental dose-optimization program which includes automated exposure control, adjustment of the mA and/or kV according to patient size and/or use of iterative reconstruction technique. COMPARISON:  CT abdomen and pelvis 05/24/2024 FINDINGS: Lower chest: There are peripheral interstitial opacities in the lung bases with small bilateral pleural effusions. Hepatobiliary: Small hepatic cysts are unchanged. Gallbladder and bile ducts are within normal limits. Pancreas: Unremarkable. No pancreatic ductal dilatation or surrounding inflammatory changes. Spleen: Normal in size without focal abnormality. Adrenals/Urinary Tract: There is a 3.6 cm  right renal cyst. There is mild nonspecific bilateral perinephric fat stranding which is new from prior. There is no hydronephrosis or urinary tract calculus. The adrenal glands are within normal limits. Suprapubic Foley catheter decompresses the bladder. Bladder wall thickening is not excluded. There is a small amount of air in the bladder. Skip Stomach/Bowel: Stomach is within normal limits. No evidence of bowel wall thickening, distention, or inflammatory changes. There is a large amount of stool throughout the entire colon. Appendix is surgically absent. Vascular/Lymphatic: Infrarenal abdominal aortic aneurysm is again seen measuring 6.8 x 5.7 cm, unchanged in size. Patient is status post aorto bi-iliac graft. There is no periaortic fluid or stranding. IVC normal in size. No enlarged lymph nodes are identified. There are atherosclerotic calcifications throughout the aorta. Reproductive: Prostate gland is enlarged. Penile implant is present with reservoir seen in the right inguinal region. There is diffuse scrotal wall edema. There is a 6.3 x 5.5 by 7.2 cm low-attenuation collection surrounding the reservoir in the superior scrotal region with surrounding edema and sent peripheral hyperdensity or calcification of uncertain etiology. Other: There is a small fat containing left inguinal hernia. There is no ascites or free air. There is mild body wall edema. Musculoskeletal: Degenerative changes affect the spine. IMPRESSION: 1. Penile implant with reservoir in the right inguinal region. There is a 7.2 cm low-attenuation collection surrounding the reservoir in the superior scrotal region with surrounding edema and sent peripheral hyperdensity or calcification of uncertain etiology. Findings may represent hematoma, seroma or abscess. 2. Suprapubic Foley catheter decompresses the bladder. Bladder wall thickening is not excluded. Correlate clinically for cystitis. 3. Mild nonspecific bilateral perinephric fat stranding  is new from prior. Correlate clinically for infection. 4. Large amount of stool throughout the colon. 5. Small bilateral pleural effusions. 6. Stable infrarenal abdominal aortic aneurysm measuring 6.8 cm. Patient is status post aorto bi-iliac graft. No periaortic fluid or stranding. Aortic Atherosclerosis (ICD10-I70.0). Electronically Signed   By: Greig Pique M.D.   On: 06/22/2024 19:12   Portable chest 1 View Result Date: 06/21/2024 CLINICAL DATA:  Cough EXAM: PORTABLE CHEST 1 VIEW COMPARISON:  Chest x-ray 12/29/2023.  Chest CT 01/08/2024. FINDINGS: There some peripheral interstitial opacities in the lung bases as seen on prior CT. There is no focal lung infiltrate, pleural effusion or pneumothorax. Heart is mildly enlarged. Aorta is tortuous. No acute fractures are seen. IMPRESSION: 1. No active disease. 2. Mild cardiomegaly. Electronically Signed   By: Greig Pique M.D.   On: 06/21/2024 19:16   CT SOFT TISSUE  NECK WO CONTRAST Result Date: 06/19/2024 CLINICAL DATA:  Chronic neck pain radiating to the right shoulder. EXAM: CT NECK WITHOUT CONTRAST TECHNIQUE: Multidetector CT imaging of the neck was performed following the standard protocol without intravenous contrast. RADIATION DOSE REDUCTION: This exam was performed according to the departmental dose-optimization program which includes automated exposure control, adjustment of the mA and/or kV according to patient size and/or use of iterative reconstruction technique. COMPARISON:  High-resolution chest CT 01/08/2024 FINDINGS: Pharynx and larynx: No definite mass or swelling is identified, however assessment is limited by noncontrast technique and mild-to-moderate motion artifact. Patent airway. No parapharyngeal or retropharyngeal fluid collection or inflammation. Salivary glands: Unremarkable parotid glands. Limited assessment of the submandibular glands due to prominent motion artifact without gross abnormality identified. Thyroid : Unremarkable. Lymph  nodes: No enlarged or suspicious lymph nodes in the neck. Vascular: Mild atherosclerotic calcification at the carotid bifurcations. Limited intracranial: Unremarkable. Visualized orbits: Unremarkable. Mastoids and visualized paranasal sinuses: Mild, chronic appearing inferior mastoid air cell opacification bilaterally. Small mucous retention cyst in the left maxillary sinus. Skeleton: Widespread cervical disc degeneration including severe disc space narrowing and degenerative endplate changes from C3-4 through C6-7. Asymmetric facet arthrosis with facet ankylosis on the right at C2-3 and on the left at C4-5. Widespread neural foraminal stenosis including severe right neural foraminal stenosis at C3-4, moderate right and severe left neural foraminal stenosis at C4-5, and moderate bilateral neural foraminal stenosis at C5-6. At least mild spinal stenosis is suspected at C3-4, C5-6, and C6-7. Upper chest: Partially visualized chronic fibrotic lung disease, more fully evaluated on the prior high-resolution chest CT. 4.6 cm ascending aortic aneurysm and 5.3 cm aneurysm of the proximal descending thoracic aorta, unchanged from the prior chest CT. Aortic and coronary atherosclerosis. Other: None. IMPRESSION: 1. No acute abnormality identified on this motion degraded noncontrast neck CT. 2. Widespread cervical disc degeneration with multilevel neural foraminal stenosis as above. 3. 4.6 cm ascending aortic aneurysm and 5.3 cm proximal descending thoracic aortic aneurysm, unchanged from 01/08/2024. Recommend semi-annual imaging followup by CTA or MRA and referral to cardiothoracic surgery if not already obtained. This recommendation follows 2010 ACCF/AHA/AATS/ACR/ASA/SCA/SCAI/SIR/STS/SVM Guidelines for the Diagnosis and Management of Patients With Thoracic Aortic Disease. Circulation. 2010; 121: [id number]. Aortic aneurysm NOS (ICD10-I71.9) 4.  Aortic Atherosclerosis (ICD10-I70.0). Electronically Signed   By: Dasie Hamburg M.D.    On: 06/19/2024 08:45   IR CYSTOSTOMY TUBE PLACEMENT/BLADDER ASPIRATION Result Date: 06/11/2024 INDICATION: Urinary retention, BPH, outflow obstruction, Foley dependent EXAM: ULTRASOUND AND FLUOROSCOPIC 16 FRENCH SUPRAPUBIC CATHETER PLACEMENT (COUNCIL TIP CATHETER) COMPARISON:  05/24/2024 MEDICATIONS: 1% LIDOCAINE  LOCAL ANESTHESIA/SEDATION: Moderate (conscious) sedation was employed during this procedure. A total of Versed  1.5 mg and Fentanyl  75 mcg was administered intravenously by the radiology nurse. Total intra-service moderate Sedation Time: 7 minutes. The patient's level of consciousness and vital signs were monitored continuously by radiology nursing throughout the procedure under my direct supervision. CONTRAST:  10 cc omni 300-administered into the collecting system(s) FLUOROSCOPY: Radiation Exposure Index (as provided by the fluoroscopic device): 4.0 mGy Kerma COMPLICATIONS: None immediate. PROCEDURE: Informed written consent was obtained from the patient after a thorough discussion of the procedural risks, benefits and alternatives. All questions were addressed. Maximal Sterile Barrier Technique was utilized including caps, mask, sterile gowns, sterile gloves, sterile drape, hand hygiene and skin antiseptic. A timeout was performed prior to the initiation of the procedure. Previous imaging reviewed. Patient positioned supine. Bladder distended with saline through the Foley catheter. Under sterile conditions and local anesthesia, ultrasound percutaneous  needle access performed of the bladder dome. Images obtained for documentation. There was return of clear urine. Amplatz guidewire inserted. Tract dilatation performed with a 8 x 100 Athletis balloon. While deflating the balloon, the 16 French balloon tip Council catheter was advanced through the percutaneous tract into the bladder. Deflated balloon and guidewire removed. Foley balloon inflated with 10 cc saline and retracted against the anterior  bladder wall. Contrast injection confirms position. Images obtained for documentation. Following the procedure the Foley catheter was removed. Sterile dressing applied. Gravity drainage bag connected. No immediate complication. Patient tolerated the procedure well. IMPRESSION: Successful ultrasound and fluoroscopic suprapubic catheter placement (16 French balloon tip Council catheter). Electronically Signed   By: CHRISTELLA.  Shick M.D.   On: 06/11/2024 11:02    Microbiology: Results for orders placed or performed during the hospital encounter of 06/21/24  Urine Culture     Status: Abnormal   Collection Time: 06/22/24  1:49 AM   Specimen: Urine, Clean Catch  Result Value Ref Range Status   Specimen Description   Final    URINE, CLEAN CATCH Performed at [facility], [address], [place], [place] [address]    Special Requests   Final    NONE Performed at [facility], [address], [place], [place] [address]    Culture (A)  Final    >=100,000 COLONIES/mL PSEUDOMONAS AERUGINOSA 70,000 COLONIES/mL CITROBACTER FREUNDII    Report Status 06/24/2024 FINAL  Final   Organism ID, Bacteria PSEUDOMONAS AERUGINOSA (A)  Final   Organism ID, Bacteria CITROBACTER FREUNDII (A)  Final      Susceptibility   Citrobacter freundii - MIC*    CEFEPIME  1 SENSITIVE Sensitive     CEFTRIAXONE  >=64 RESISTANT Resistant     CIPROFLOXACIN  <=0.25 SENSITIVE Sensitive     GENTAMICIN <=1 SENSITIVE Sensitive     IMIPENEM 1 SENSITIVE Sensitive     NITROFURANTOIN <=16 SENSITIVE Sensitive     TRIMETH /SULFA  <=20 SENSITIVE Sensitive     PIP/TAZO >=128 RESISTANT Resistant ug/mL    * 70,000 COLONIES/mL CITROBACTER FREUNDII   Pseudomonas aeruginosa - MIC*    CEFTAZIDIME <=1 SENSITIVE Sensitive     CIPROFLOXACIN  <=0.25 SENSITIVE Sensitive     GENTAMICIN <=1 SENSITIVE Sensitive     IMIPENEM 2 SENSITIVE Sensitive     PIP/TAZO <=4 SENSITIVE Sensitive ug/mL    CEFEPIME  2 SENSITIVE Sensitive      * >=100,000 COLONIES/mL PSEUDOMONAS AERUGINOSA  Aerobic/Anaerobic Culture w Gram Stain (surgical/deep wound)     Status: None (Preliminary result)   Collection Time: 06/23/24  9:05 AM   Specimen: Wound  Result Value Ref Range Status   Specimen Description   Final    WOUND Performed at [facility], [address], [place], [place] [address]    Special Requests   Final    NONE Performed at [facility], [address], [place], [place] [address]    Gram Stain   Final    FEW WBC PRESENT, PREDOMINANTLY PMN RARE GRAM NEGATIVE RODS Performed at [facility], [address] [place], [place] [address]    Culture   Final    MODERATE SERRATIA MARCESCENS NO ANAEROBES ISOLATED; CULTURE IN PROGRESS FOR 5 DAYS    Report Status PENDING  Incomplete   Organism ID, Bacteria SERRATIA MARCESCENS  Final      Susceptibility   Serratia marcescens - MIC*    CEFEPIME  <=0.12 SENSITIVE Sensitive     CEFTAZIDIME <=1 SENSITIVE  Sensitive     CEFTRIAXONE  <=0.25 SENSITIVE Sensitive     CIPROFLOXACIN  <=0.25 SENSITIVE Sensitive     GENTAMICIN <=1 SENSITIVE Sensitive     TRIMETH /SULFA  <=20 SENSITIVE Sensitive     * MODERATE SERRATIA MARCESCENS  MRSA Next Gen by PCR, Nasal     Status: None   Collection Time: 06/23/24 11:45 AM   Specimen: Nasal Mucosa; Nasal Swab  Result Value Ref Range Status   MRSA by PCR Next Gen NOT DETECTED NOT DETECTED Final    Comment: (NOTE) The GeneXpert MRSA Assay (FDA approved for NASAL specimens only), is one component of a comprehensive MRSA colonization surveillance program. It is not intended to diagnose MRSA infection nor to guide or monitor treatment for MRSA infections. Test performance is not FDA approved in patients less than 39 years old. Performed at [facility], [address], [place], [place] [address]     Labs: CBC: Recent Labs  Lab 06/21/24 2104 06/22/24 0522 06/23/24 0444  06/24/24 1056 06/25/24 0518  WBC  --  20.2* 13.4* 21.9* 16.0*  HGB 10.4* 9.9* 9.7* 10.1* 9.7*  HCT 32.8* 31.9* 30.6* 33.3* 30.1*  MCV  --  106.0* 105.9* 109.5* 106.4*  PLT  --  303 293 290 304   Basic Metabolic Panel: Recent Labs  Lab 06/22/24 0522 06/22/24 1540 06/23/24 0444 06/24/24 1056 06/25/24 0518  NA 140 137 138 138 141  K 4.3 4.0 4.0 4.1 3.8  CL 107 105 106 107 107  CO2 25 23 26 22 26   GLUCOSE 107* 135* 112* 181* 120*  BUN 30* 28* 29* 29* 29*  CREATININE 1.37* 1.23 1.25* 1.27* 1.13  CALCIUM  7.9* 7.2* 7.6* 7.5* 7.7*  MG  --   --  2.6* 2.7*  --   PHOS  --   --  3.0 2.1*  --    Liver Function Tests: Recent Labs  Lab 06/21/24 2104  AST 22  ALT 27  ALKPHOS 70  BILITOT 0.4  PROT 6.4*  ALBUMIN  3.0*   CBG: No results for input(s): GLUCAP in the last 168 hours.  Discharge time spent: greater than 30 minutes.  Signed: Sabas GORMAN Brod, MD [facility] 06/26/2024

## 2024-06-26 NOTE — Progress Notes (Incomplete)
 [facility]  PROGRESS NOTE  [name] FMW:[id number] DOB: [birth year] DOA: 06/21/2024 PCP: Shayne Anes, MD   Brief HPI:   89 year old with PMH of BPH with LUTS, urinary retention s/p suprapubic catheter, HLD, HTN, interstitial lung disease, anxiety and depression, polymyalgia rheumatica, paroxysmal A-fib on Eliquis , PE/DVT, AAA s/p stent graft in 2018, chronic diastolic HF, IDA and a recently diagnosed pneumonia who was directly admitted by urology for infected penile prosthesis.  [facility] consulted to manage medical problems. On 06/23/2024 [facility] assumed primary  team as patient was transferred to stepdown after his surgery.    Assessment/Plan:     Infected penile implant, status post removal Complicated UTI Patient was taken to the OR on 06/23/2024 by urology Underwent cystoscopy with complex catheter placement as well as removal of infected inflatable penile prosthesis Lots of  pus was drained and JP drain currently in place Urine culture is growing Citrobacter as well as Pseudomonas and Serratia are sensitive to ciprofloxacin  and cefepime  Vancomycin  and Zosyn  discontinued and cefepime  initiated based on sensitivity results We can transition patient to ciprofloxacin  at discharge to complete a total of 14 days of complicated UTI According to urology the drain will have to be removed before discharge   # Acute on chronic hypoxic respiratory failure # Interstitial lung disease # Community-acquired pneumonia - Diagnosed with LLL PNA by PCP on 7/21 and started on Augmentin and azithromycin  Uses 2 L of oxygen  at home - Repeat CXR shows resolution of the focal lung infiltrate around the penile implant site. - As needed DuoNebs - Incentive spirometer, flutter valve   Acute kidney injury-improved Renal function is around the baseline Monitor renal function   # Paroxysmal A-fib - Rate controlled, HR in the 80s - EKG shows sinus tach - Eliquis  resumed as agreed by  urologist   # Polymyalgia rheumatica # Right shoulder pain - Follows with [facility], on prednisone  12.5 mg daily and methotrexate 12.5 mg weekly - Hold methotrexate in the setting of acute infection - Continue prednisone  and Lyrica    # IDA Monitor CBC   # Hx of DVT/PE Continue Eliquis    # Anxiety and depression - Continue mirtazapine  and lorazepam    # Insomnia - Continue melatonin and as needed zolpidem    # Hx of herpes ophthalmicus - Continue acyclovir  to prevent herpes flare   # AAA/ s/p Repair - Appears stable on recent CT   # GERD - Continue PPI   # BPH # Urinary retention s/p suprapubic catheter - Continue finasteride  and tamsulosin  - Foley management per primary team           Disposition: Pending medical stabilization and clearance by urology before discharge. PT OT on board and recommending home   Medications     acetaminophen   1,000 mg Oral Q6H   acyclovir   400 mg Oral BID   apixaban   2.5 mg Oral BID   Chlorhexidine  Gluconate Cloth  6 each Topical Daily   docusate sodium   100 mg Oral BID   feeding supplement  237 mL Oral TID BM   finasteride   5 mg Oral Daily   fluorometholone   1 drop Right Eye QHS   folic acid   1 mg Oral Daily   Gerhardt's butt cream   Topical BID   LORazepam   0.25 mg Oral BID   LORazepam   0.5 mg Oral QHS   melatonin  5 mg Oral QHS   mirtazapine   30 mg Oral QHS   multivitamin with minerals  1 tablet  Oral Daily   pantoprazole   40 mg Oral Daily   polyethylene glycol  17 g Oral BID   predniSONE   10 mg Oral Q breakfast   predniSONE   2.5 mg Oral Q breakfast   pregabalin   75 mg Oral Q24H   senna  1 tablet Oral BID     Data Reviewed:   CBG:  No results for input(s): GLUCAP in the last 168 hours.  SpO2: 100 % O2 Flow Rate (L/min): 2 L/min    Vitals:   06/25/24 0819 06/25/24 1209 06/25/24 2106 06/26/24 0544  BP: 117/67 (!) 97/59 124/73 (!) 141/81  Pulse: 75 80 72 69  Resp: 20 14 18 18   Temp: (!) 97.5 F  (36.4 C) 98.4 F (36.9 C) 97.8 F (36.6 C) 98.2 F (36.8 C)  TempSrc: Oral Oral Oral Oral  SpO2: 100% 96% 100% 100%  Weight:      Height:          Data Reviewed:  Basic Metabolic Panel: Recent Labs  Lab 06/22/24 0522 06/22/24 1540 06/23/24 0444 06/24/24 1056 06/25/24 0518  NA 140 137 138 138 141  K 4.3 4.0 4.0 4.1 3.8  CL 107 105 106 107 107  CO2 25 23 26 22 26   GLUCOSE 107* 135* 112* 181* 120*  BUN 30* 28* 29* 29* 29*  CREATININE 1.37* 1.23 1.25* 1.27* 1.13  CALCIUM  7.9* 7.2* 7.6* 7.5* 7.7*  MG  --   --  2.6* 2.7*  --   PHOS  --   --  3.0 2.1*  --     CBC: Recent Labs  Lab 06/21/24 2104 06/22/24 0522 06/23/24 0444 06/24/24 1056 06/25/24 0518  WBC  --  20.2* 13.4* 21.9* 16.0*  HGB 10.4* 9.9* 9.7* 10.1* 9.7*  HCT 32.8* 31.9* 30.6* 33.3* 30.1*  MCV  --  106.0* 105.9* 109.5* 106.4*  PLT  --  303 293 290 304    LFT Recent Labs  Lab 06/21/24 2104  AST 22  ALT 27  ALKPHOS 70  BILITOT 0.4  PROT 6.4*  ALBUMIN  3.0*     Antibiotics: Anti-infectives (From admission, onward)    Start     Dose/Rate Route Frequency Ordered Stop   06/24/24 1400  ceFEPIme  (MAXIPIME ) 2 g in sodium chloride  0.9 % 100 mL IVPB        2 g 200 mL/hr over 30 Minutes Intravenous 2 times daily 06/24/24 0952     06/22/24 2000  vancomycin  (VANCOCIN ) IVPB 1000 mg/200 mL premix  Status:  Discontinued        1,000 mg 200 mL/hr over 60 Minutes Intravenous Every 24 hours 06/21/24 1852 06/24/24 0942   06/21/24 2315  acyclovir  (ZOVIRAX ) tablet 400 mg        400 mg Oral 2 times daily 06/21/24 2225     06/21/24 2000  piperacillin -tazobactam (ZOSYN ) IVPB 3.375 g  Status:  Discontinued        3.375 g 12.5 mL/hr over 240 Minutes Intravenous Every 8 hours 06/21/24 1754 06/24/24 0942   06/21/24 1900  vancomycin  (VANCOREADY) IVPB 1500 mg/300 mL        1,500 mg 150 mL/hr over 120 Minutes Intravenous  Once 06/21/24 1809 06/21/24 2250   06/21/24 1845  vancomycin  (VANCOREADY) IVPB 750 mg/150 mL   Status:  Discontinued        750 mg 150 mL/hr over 60 Minutes Intravenous Every 12 hours 06/21/24 1754 06/21/24 1809        DVT prophylaxis: ***  Code Status: ***  Family Communication: ***   CONSULTS ***   Subjective   ***   Objective    Physical Examination:   General:  *** Cardiovascular: *** Respiratory: *** Abdomen: *** Extremities: ***  Neurologic:  ***   Status is: Inpatient:  ***           Lovie Agresta S Elgar Scoggins   [facility] If 7PM-7AM, please contact night-coverage at [email or web address], Office  [phone]   06/26/2024, 8:03 AM  LOS: 5 days

## 2024-06-28 ENCOUNTER — Telehealth: Payer: Self-pay | Admitting: Adult Health

## 2024-06-28 NOTE — Telephone Encounter (Signed)
 Pt wife states she missed Ginas call. Said the extra Lorazapam isnt doing the trick.

## 2024-06-29 LAB — AEROBIC/ANAEROBIC CULTURE W GRAM STAIN (SURGICAL/DEEP WOUND)

## 2024-07-01 DIAGNOSIS — D72829 Elevated white blood cell count, unspecified: Secondary | ICD-10-CM | POA: Diagnosis not present

## 2024-07-01 DIAGNOSIS — T8361XA Infection and inflammatory reaction due to implanted penile prosthesis, initial encounter: Secondary | ICD-10-CM | POA: Diagnosis not present

## 2024-07-01 DIAGNOSIS — L89151 Pressure ulcer of sacral region, stage 1: Secondary | ICD-10-CM | POA: Diagnosis not present

## 2024-07-01 DIAGNOSIS — R051 Acute cough: Secondary | ICD-10-CM | POA: Diagnosis not present

## 2024-07-01 DIAGNOSIS — J189 Pneumonia, unspecified organism: Secondary | ICD-10-CM | POA: Diagnosis not present

## 2024-07-01 DIAGNOSIS — J8410 Pulmonary fibrosis, unspecified: Secondary | ICD-10-CM | POA: Diagnosis not present

## 2024-07-01 DIAGNOSIS — R600 Localized edema: Secondary | ICD-10-CM | POA: Diagnosis not present

## 2024-07-01 DIAGNOSIS — N390 Urinary tract infection, site not specified: Secondary | ICD-10-CM | POA: Diagnosis not present

## 2024-07-01 DIAGNOSIS — F0390 Unspecified dementia without behavioral disturbance: Secondary | ICD-10-CM | POA: Diagnosis not present

## 2024-07-01 DIAGNOSIS — N179 Acute kidney failure, unspecified: Secondary | ICD-10-CM | POA: Diagnosis not present

## 2024-07-01 DIAGNOSIS — J9601 Acute respiratory failure with hypoxia: Secondary | ICD-10-CM | POA: Diagnosis not present

## 2024-07-01 NOTE — Telephone Encounter (Signed)
 Wife is asking for you to call her. She reports that you told her to take a lorazepam  and 1/2 Remeron  at 3:30 and it isn't helping. She reports she is giving him 2 lorazepam  and that is working.

## 2024-07-01 NOTE — Telephone Encounter (Signed)
 Wife notified of recommendation.

## 2024-07-02 ENCOUNTER — Encounter

## 2024-07-02 ENCOUNTER — Ambulatory Visit (INDEPENDENT_AMBULATORY_CARE_PROVIDER_SITE_OTHER)

## 2024-07-02 VITALS — BP 112/70 | HR 113 | Temp 97.6°F | Ht 72.0 in | Wt 184.0 lb

## 2024-07-02 DIAGNOSIS — J849 Interstitial pulmonary disease, unspecified: Secondary | ICD-10-CM

## 2024-07-02 DIAGNOSIS — E43 Unspecified severe protein-calorie malnutrition: Secondary | ICD-10-CM | POA: Diagnosis not present

## 2024-07-02 DIAGNOSIS — J189 Pneumonia, unspecified organism: Secondary | ICD-10-CM | POA: Diagnosis not present

## 2024-07-02 DIAGNOSIS — R600 Localized edema: Secondary | ICD-10-CM | POA: Diagnosis not present

## 2024-07-02 DIAGNOSIS — R5383 Other fatigue: Secondary | ICD-10-CM

## 2024-07-02 MED ORDER — AMOXICILLIN-POT CLAVULANATE 875-125 MG PO TABS: 1.0000 | ORAL_TABLET | Freq: Two times a day (BID) | ORAL | 0 refills | Status: DC

## 2024-07-02 NOTE — Assessment & Plan Note (Signed)
-    Esbriet now available for treatment. -  Plan to hold on instituting this medication for now in the setting of his recent hospitalization and ongoing medical issues including active infection -  Reassess at appointment in next 3 weeks.

## 2024-07-02 NOTE — Patient Instructions (Addendum)
 Start antibiotic and finish full course; Rx sent to pharmacy  Increase protein intake as previously recommended.  Activity as tolerated.  Keep appt as scheduled with Dr. Geronimo on 07/23/2024.  Call if new or worsening symptoms.  Check daily weights; call if 2-4 pound increase in a 24 hour period.

## 2024-07-02 NOTE — Progress Notes (Signed)
 @Patient  ID: [name], male    DOB: 1930/04/13, 89 y.o.   MRN: [id number]  Chief Complaint  Patient presents with   Follow-up    Said white counts are up and oxygen  has been lower than normal.  Productive cough with white/clear phlegm. Cough has been going on about 5 days/     Referring provider: Shayne Anes, MD  HPI: [name] is a 89 y/o male with PMH of DVT/PE, chronic diastolic CHF, Afib on Eliquis , chronic respiratory failure, and ILD who presents today for evaluation of dyspnea after a recent hospitalization for sepsis secondary to penile implant infection.  Prior to his hospitalization 7/25-7/30, he was found to have pneumonia of the LLL and started Cefdinir and Azithro (7/23).  He completed 2 days of treatment before he was admitted for the penile infection; this required surgical exploration and resulted in local edema.  Since discharge from the hospital, it was noted that he had peripheral edema of the legs and localized edema of the scrotum, hips and abdomen.  His wife and caregiver are present today and note that he has actually had improvement in the edema over the last 2 days.  There have been no daily weights recorded.  He was seen by his PCP yesterday and repeat chest x-ray was completed.  The report revealed that the previously noted left lower lobe infiltrate is somewhat improved.  He also had a CBC completed which demonstrated an increase in his white blood cell count up to 21,000.  At discharge his white count had dropped to 16,000.  He currently complains of cough productive of clear to white phlegm.  He has also had some fatigue and slept in today which is unlike him.  He denies fever, chills, headache, dizziness, chest pain.  He reports no change in appetite.  He remains on Cipro  presently for the penile infection with sepsis.  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   Denosumab   Other (See Comments)    PROLIA  - Bad reaction 12/2023- will not receive this again    Rosuvastatin  Other (See Comments)    Stopped taking due to feeling achy     Doxycycline  Other (See Comments)    Upset stomach, currently taking and tolerating well (??)   Morphine  And Codeine Nausea And Vomiting   Valium [Diazepam] Other (See Comments)    Caused TOO MUCH sedation    Immunization History  Administered Date(s) Administered   Dtap, Unspecified 06/29/2010   Influenza, High Dose Seasonal PF 09/01/2017, 08/31/2022   Influenza, Quadrivalent, Recombinant, Inj, Pf 09/01/2018, 08/31/2019, 08/19/2020, 09/24/2021   Influenza,inj,Quad PF,6+ Mos 08/20/2013, 08/26/2014, 09/23/2015, 10/04/2016   Influenza-Unspecified 09/22/2006, 09/26/2007, 10/13/2012   PFIZER(Purple Top)SARS-COV-2 Vaccination 12/10/2019, 12/28/2019, 08/10/2020   Pneumococcal Conjugate-13 05/30/2018   Pneumococcal Polysaccharide-23 07/21/1998, 10/26/2022   Tdap 06/29/2010    Past Medical History:  Diagnosis Date   AAA (abdominal aortic aneurysm) (HCC)    Abnormal EKG    Left atrial abnormality   Allergic rhinitis    Allergy    Benign neoplasm of colon 04/14/2010   3 small polyps on colonoscopy by Dr. [name]   Carpal tunnel syndrome    Cataract    Decubitus ulcer of coccygeal region, stage 2 (HCC) 07/06/2023   Degenerative disc disease    Disturbances metabolism of methionine, homocystine, and cystathionine (HCC)    Elevated homocysteine   Elevated prostate specific antigen (PSA)  Elevated PSA 07/06/2023   Hearing loss    Hyperlipidemia    ILD (interstitial lung disease) (HCC) 07/06/2023   Impotence of organic origin    Penile implant   Internal hemorrhoids    Leukocytosis 07/06/2023   Neck pain    Otosclerosis of both ears    Peripheral vascular disease (HCC)    Bilateral femoral bruit   Plantar fasciitis    Polymyalgia rheumatica (HCC)    Polymyalgia rheumatica (HCC) 07/06/2023   Rotator cuff syndrome of  left shoulder    Scoliosis    Shoulder pain     Tobacco History: Social History   Tobacco Use  Smoking Status Never  Smokeless Tobacco Never   Counseling given: Not Answered   Outpatient Medications Prior to Visit  Medication Sig Dispense Refill   Acetaminophen  500 MG capsule Take 500-1,000 mg by mouth See admin instructions. Tylenol  Rapid Release 500 mg capsules- Take 500-1,000 mg by mouth every eight hours as needed for pain     acyclovir  (ZOVIRAX ) 400 MG tablet Take 400 mg by mouth 2 (two) times daily.     apixaban  (ELIQUIS ) 2.5 MG TABS tablet Take 2.5 mg by mouth 2 (two) times daily.     butalbital -acetaminophen -caffeine  (FIORICET ) 50-325-40 MG tablet Take 1 tablet by mouth daily as needed for headache. 14 tablet 0   cholecalciferol (VITAMIN D3) 25 MCG (1000 UNIT) tablet Take 2,000 Units by mouth daily.     ciprofloxacin  (CIPRO ) 500 MG tablet Take 1 tablet (500 mg total) by mouth 2 (two) times daily for 14 days. 28 tablet 0   diclofenac  Sodium (VOLTAREN ) 1 % GEL Apply 2 g topically 4 (four) times daily as needed (shoulder pain). 2 g 0   dicyclomine  (BENTYL ) 10 MG/5ML solution Take 20 mg by mouth 3 (three) times daily as needed (for cramping or abdominal pain).     docusate sodium  (COLACE) 100 MG capsule Take 100 mg by mouth at bedtime.     feeding supplement (ENSURE ENLIVE / ENSURE PLUS) LIQD Take 237 mLs by mouth 3 (three) times daily between meals. 237 mL 12   finasteride  (PROSCAR ) 5 MG tablet Take 5 mg by mouth daily.     fluorometholone  (FML) 0.1 % ophthalmic suspension Place 1 drop into the right eye at bedtime.      folic acid  (FOLVITE ) 1 MG tablet Take 1 mg by mouth daily.     lansoprazole (PREVACID) 30 MG capsule Take 30 mg by mouth 2 (two) times daily before a meal.     lidocaine  (LIDODERM ) 5 % Place 1 patch onto the skin daily as needed (for pain- Remove & Discard patch within 12 hours or as directed by MD).     LORazepam  (ATIVAN ) 0.5 MG tablet Take 0.5 mg by mouth See  admin instructions. Take 0.5 mg by mouth at bedtime and an additional 0.5 mg up to twice a day as needed for anxiety     melatonin 5 MG TABS Take 1 tablet (5 mg total) by mouth at bedtime.     methotrexate (RHEUMATREX) 2.5 MG tablet Take 12.5 mg by mouth every Friday.     mirtazapine  (REMERON ) 15 MG tablet Take 30 mg by mouth at bedtime.     Multiple Vitamins-Minerals (MULTIVITAMIN PO) Take 1 tablet by mouth daily.      oxyCODONE  (OXY IR/ROXICODONE ) 5 MG immediate release tablet Take 1 tablet (5 mg total) by mouth every 4 (four) hours as needed for moderate pain (pain score 4-6). 20 tablet 0  polyethylene glycol (MIRALAX  / GLYCOLAX ) 17 g packet Take 17 g by mouth in the morning and at bedtime.     predniSONE  (DELTASONE ) 10 MG tablet Take 10 mg by mouth daily with breakfast.     predniSONE  (DELTASONE ) 2.5 MG tablet Take 2.5 mg by mouth daily with breakfast.     pregabalin  (LYRICA ) 75 MG capsule Take 75 mg by mouth at bedtime.     senna (SENOKOT) 8.6 MG TABS tablet Take 1 tablet (8.6 mg total) by mouth daily as needed for mild constipation. 30 tablet 0   senna-docusate (SENNA-S) 8.6-50 MG tablet Take 1 tablet by mouth in the morning.     zolpidem  (AMBIEN ) 5 MG tablet Take 5 mg by mouth at bedtime.     tamsulosin  (FLOMAX ) 0.4 MG CAPS capsule Take 0.8 mg by mouth at bedtime. (Patient not taking: Reported on 07/02/2024)     No facility-administered medications prior to visit.     Review of Systems:   Constitutional:   No  weight loss, night sweats,  Fevers, chills, fatigue, or  lassitude.  HEENT:   No headaches,  Difficulty swallowing,  Tooth/dental problems, or  Sore throat,                No sneezing, itching, ear ache, nasal congestion, post nasal drip,   CV:  No chest pain,  Orthopnea, PND, anasarca, dizziness, palpitations, syncope.  Positive for ankle edema  GI  No heartburn, indigestion, abdominal pain, nausea, vomiting, diarrhea, change in bowel habits, loss of appetite, bloody stools.    Resp: Positive for productive cough of clear to white sputum.   no coughing up of blood.  No change in color of mucus.  No wheezing.  No chest wall deformity  Skin: no rash or lesions.  GU: no dysuria, change in color of urine, no urgency or frequency.  No flank pain, no hematuria Positive for swelling at the surgical site  MS:  No joint pain or swelling.  No decreased range of motion.  No back pain.    Physical Exam  BP 112/70 (BP Location: Right Arm, Patient Position: Sitting, Cuff Size: Normal)   Pulse (!) 113   Temp 97.6 F (36.4 C) (Oral)   Ht 6' (1.829 m)   Wt 184 lb (83.5 kg)   SpO2 100%   BMI 24.95 kg/m   GEN: A/Ox3; pleasant , NAD, well nourished    HEENT:  [place]/AT,  EACs-clear, TMs-wnl, NOSE-clear, THROAT-clear, no lesions, no postnasal drip or exudate noted.   NECK:  Supple w/ fair ROM; no JVD; normal carotid impulses w/o bruits; no thyromegaly or nodules palpated; no lymphadenopathy.    RESP  Clear  P & A; w/o, wheezes or rhonchi. no accessory muscle use, no dullness to percussion.  Persistent crackles in the left lower lobe despite cough.  CARD:  RRR, no m/r/g, pulses intact, no cyanosis or clubbing. 1+ edema of the ankles only; no extension up the leg  GI:   Soft & nt; nml bowel sounds; no organomegaly or masses detected.   Musco: Warm bil, no deformities or joint swelling noted.   Neuro: alert, no focal deficits noted.    Skin: Warm, no lesions or rashes  GU: Foley intact with surrounding swelling.  Some focal swelling noted into the abdomen pelvis  Lab Results:  CBC    Component Value Date/Time   WBC 16.0 (H) 06/25/2024 0518   RBC 2.83 (L) 06/25/2024 0518   HGB 9.7 (L) 06/25/2024 0518  HGB 11.0 (L) 06/06/2024 1428   HCT 30.1 (L) 06/25/2024 0518   PLT 304 06/25/2024 0518   PLT 258 06/06/2024 1428   MCV 106.4 (H) 06/25/2024 0518   MCH 34.3 (H) 06/25/2024 0518   MCHC 32.2 06/25/2024 0518   RDW 18.2 (H) 06/25/2024 0518   RDW 14.1 02/14/2014  0821   LYMPHSABS 1.9 06/06/2024 1428   LYMPHSABS 1.6 02/14/2014 0821   MONOABS 1.0 06/06/2024 1428   EOSABS 0.2 06/06/2024 1428   EOSABS 0.2 02/14/2014 0821   BASOSABS 0.1 06/06/2024 1428   BASOSABS 0.0 02/14/2014 0821    BMET    Component Value Date/Time   NA 141 06/25/2024 0518   NA 142 03/21/2016 0819   K 3.8 06/25/2024 0518   CL 107 06/25/2024 0518   CO2 26 06/25/2024 0518   GLUCOSE 120 (H) 06/25/2024 0518   BUN 29 (H) 06/25/2024 0518   BUN 21 03/21/2016 0819   CREATININE 1.13 06/25/2024 0518   CREATININE 1.29 (H) 06/06/2024 1428   CREATININE 0.91 05/24/2017 0922   CALCIUM  7.7 (L) 06/25/2024 0518   GFRNONAA >60 06/25/2024 0518   GFRNONAA 52 (L) 06/06/2024 1428   GFRNONAA 77 05/24/2017 0922   GFRAA >60 01/24/2018 1402   GFRAA 89 05/24/2017 0922    BNP    Component Value Date/Time   BNP 187.6 (H) 06/24/2024 1056    ProBNP    Component Value Date/Time   PROBNP 99.0 12/27/2023 1607    Imaging: CT Chest High Resolution Result Date: 06/24/2024 CLINICAL DATA:  Diffuse interstitial lung disease EXAM: CT CHEST WITHOUT CONTRAST TECHNIQUE: Multidetector CT imaging of the chest was performed following the standard protocol without intravenous contrast. High resolution imaging of the lungs, as well as inspiratory and expiratory imaging, was performed. RADIATION DOSE REDUCTION: This exam was performed according to the departmental dose-optimization program which includes automated exposure control, adjustment of the mA and/or kV according to patient size and/or use of iterative reconstruction technique. COMPARISON:  Chest CT January 08, 2024 FINDINGS: Cardiovascular: Aneurysmal dilation of ascending aorta up to 4.2 cm. Aneurysmal dilation of proximal descending aorta measuring up to 5.4 cm. Atherosclerotic calcifications of aorta, aortic annulus and coronary arteries. The heart size is enlarged. No pericardial effusion. Mediastinum/Nodes: Multiple subcentimeter bilateral  paratracheal lymph nodes. Mildly patulous esophagus. Lungs/Pleura: Again seen there are peripheral and lower lobe predominant subpleural reticulation, interlobular septal thickening, mild architectural distortion, and bronchiectasis/bronchiolectasis with dendritic ossifications. Questionable honeycombing is forming in posterior right upper lobe. No significant mosaic ground-glass attenuation to suggest air trapping. No new suspicious pulmonary nodule. Diffuse bronchial and bronchiolar wall thickening. Bilateral small pleural effusions, stable to prior. Upper Abdomen: Multiple hypodense liver lesions similar to prior. Gallbladder is distended. Small hiatal hernia. Hypodense right kidney cortical lesion, incompletely included in the field of view likely a cortical simple degenerative changes cyst. Musculoskeletal: Degenerative changes of the spine. Small lipoma in left rhomboid muscle. IMPRESSION: Interstitial lung disease most consistent with UIP pattern, stable to prior. No new or suspicious pulmonary nodule. Recommend follow-up to ensure stability. Small bilateral pleural effusions increased to prior. Subjacent atelectasis of both lung base with dendritic ossifications likely post aspiration , infectious/inflammatory more conspicuous to prior. Ascending and descending thoracic aorta aneurysmal dilations, stable to prior. Recommend semiannual imaging follow-up by CTA or MRA. Aortic Atherosclerosis (ICD10-I70.0). Electronically Signed   By: Megan  Zare M.D.   On: 06/24/2024 13:29   ECHOCARDIOGRAM COMPLETE Result Date: 06/23/2024    ECHOCARDIOGRAM REPORT   Patient Name:  Koston S Cervi Date of Exam: 06/23/2024 Medical Rec #:  [id number]      Height:       72.0 in Accession #:    [id number]     Weight:       184.7 lb Date of Birth:  [birth year]     BSA:          2.060 m Patient Age:    89 years       BP:           105/49 mmHg Patient Gender: M              HR:           70 bpm. Exam Location:  Inpatient Procedure: 2D  Echo, Color Doppler and Cardiac Doppler (Both Spectral and Color            Flow Doppler were utilized during procedure). Indications:    [place] respiratory distress R06.03  History:        Patient has no prior history of Echocardiogram examinations,                 most recent 08/28/2023.  Sonographer:    Benard Stallion Referring Phys: [id number] MURALI RAMASWAMY IMPRESSIONS  1. Left ventricular ejection fraction, by estimation, is 55 to 60%. The left ventricle has normal function. The left ventricle has no regional wall motion abnormalities. Left ventricular diastolic parameters are indeterminate.  2. Right ventricular systolic function is normal. The right ventricular size is normal. There is normal pulmonary artery systolic pressure.  3. The mitral valve is grossly normal. Trivial mitral valve regurgitation. No evidence of mitral stenosis.  4. The aortic valve was not well visualized. Aortic valve regurgitation is not visualized. Mild aortic valve stenosis. Aortic valve area, by VTI measures 1.61 cm. Aortic valve mean gradient measures 8.0 mmHg. Aortic valve Vmax measures 1.94 m/s.  5. The inferior vena cava is normal in size with greater than 50% respiratory variability, suggesting right atrial pressure of 3 mmHg. Comparison(s): No significant change from prior study. FINDINGS  Left Ventricle: Left ventricular ejection fraction, by estimation, is 55 to 60%. The left ventricle has normal function. The left ventricle has no regional wall motion abnormalities. The left ventricular internal cavity size was normal in size. There is  no left ventricular hypertrophy. Left ventricular diastolic parameters are indeterminate. Right Ventricle: The right ventricular size is normal. No increase in right ventricular wall thickness. Right ventricular systolic function is normal. There is normal pulmonary artery systolic pressure. The tricuspid regurgitant velocity is 2.28 m/s, and  with an assumed right atrial pressure of 3  mmHg, the estimated right ventricular systolic pressure is 23.8 mmHg. Left Atrium: Left atrial size was normal in size. Right Atrium: Right atrial size was normal in size. Pericardium: There is no evidence of pericardial effusion. Presence of epicardial fat layer. Mitral Valve: The mitral valve is grossly normal. Trivial mitral valve regurgitation. No evidence of mitral valve stenosis. Tricuspid Valve: The tricuspid valve is grossly normal. Tricuspid valve regurgitation is trivial. No evidence of tricuspid stenosis. Aortic Valve: The aortic valve was not well visualized. Aortic valve regurgitation is not visualized. Mild aortic stenosis is present. Aortic valve mean gradient measures 8.0 mmHg. Aortic valve peak gradient measures 15.0 mmHg. Aortic valve area, by VTI measures 1.61 cm. Pulmonic Valve: The pulmonic valve was grossly normal. Pulmonic valve regurgitation is not visualized. No evidence of pulmonic stenosis. Aorta: The aortic root is normal in size and structure. Venous:  The inferior vena cava is normal in size with greater than 50% respiratory variability, suggesting right atrial pressure of 3 mmHg. IAS/Shunts: The atrial septum is grossly normal.  LEFT VENTRICLE PLAX 2D LVIDd:         3.90 cm   Diastology LVIDs:         2.80 cm   LV e' medial:    5.77 cm/s LV PW:         1.00 cm   LV E/e' medial:  14.5 LV IVS:        1.00 cm   LV e' lateral:   7.62 cm/s LVOT diam:     2.40 cm   LV E/e' lateral: 11.0 LV SV:         68 LV SV Index:   33 LVOT Area:     4.52 cm  RIGHT VENTRICLE RV Basal diam:  3.70 cm RV Mid diam:    2.70 cm RV S prime:     12.40 cm/s TAPSE (M-mode): 2.4 cm LEFT ATRIUM             Index        RIGHT ATRIUM           Index LA diam:        3.60 cm 1.75 cm/m   RA Area:     20.70 cm LA Vol (A2C):   83.0 ml 40.29 ml/m  RA Volume:   59.00 ml  28.64 ml/m LA Vol (A4C):   38.5 ml 18.69 ml/m LA Biplane Vol: 56.9 ml 27.62 ml/m  AORTIC VALVE AV Area (Vmax):    1.63 cm AV Area (Vmean):   1.58 cm  AV Area (VTI):     1.61 cm AV Vmax:           193.67 cm/s AV Vmean:          132.333 cm/s AV VTI:            0.422 m AV Peak Grad:      15.0 mmHg AV Mean Grad:      8.0 mmHg LVOT Vmax:         69.80 cm/s LVOT Vmean:        46.300 cm/s LVOT VTI:          0.150 m LVOT/AV VTI ratio: 0.36  AORTA Ao Root diam: 3.30 cm MITRAL VALVE               TRICUSPID VALVE MV Area (PHT): 3.99 cm    TR Peak grad:   20.8 mmHg MV Decel Time: 190 msec    TR Vmax:        228.00 cm/s MV E velocity: 83.60 cm/s MV A velocity: 67.30 cm/s  SHUNTS MV E/A ratio:  1.24        Systemic VTI:  0.15 m                            Systemic Diam: 2.40 cm Darryle Decent MD Electronically signed by Darryle Decent MD Signature Date/Time: 06/23/2024/12:50:27 PM    Final    CT ABDOMEN PELVIS WO CONTRAST Result Date: 06/22/2024 CLINICAL DATA:  Abdominal pain, postoperative sepsis. Diffuse interstitial lung disease. EXAM: CT ABDOMEN AND PELVIS WITHOUT CONTRAST TECHNIQUE: Multidetector CT imaging of the abdomen and pelvis was performed following the standard protocol without IV contrast. RADIATION DOSE REDUCTION: This exam was performed according to the departmental dose-optimization program which includes automated exposure control,  adjustment of the mA and/or kV according to patient size and/or use of iterative reconstruction technique. COMPARISON:  CT abdomen and pelvis 05/24/2024 FINDINGS: Lower chest: There are peripheral interstitial opacities in the lung bases with small bilateral pleural effusions. Hepatobiliary: Small hepatic cysts are unchanged. Gallbladder and bile ducts are within normal limits. Pancreas: Unremarkable. No pancreatic ductal dilatation or surrounding inflammatory changes. Spleen: Normal in size without focal abnormality. Adrenals/Urinary Tract: There is a 3.6 cm right renal cyst. There is mild nonspecific bilateral perinephric fat stranding which is new from prior. There is no hydronephrosis or urinary tract calculus. The adrenal  glands are within normal limits. Suprapubic Foley catheter decompresses the bladder. Bladder wall thickening is not excluded. There is a small amount of air in the bladder. Skip Stomach/Bowel: Stomach is within normal limits. No evidence of bowel wall thickening, distention, or inflammatory changes. There is a large amount of stool throughout the entire colon. Appendix is surgically absent. Vascular/Lymphatic: Infrarenal abdominal aortic aneurysm is again seen measuring 6.8 x 5.7 cm, unchanged in size. Patient is status post aorto bi-iliac graft. There is no periaortic fluid or stranding. IVC normal in size. No enlarged lymph nodes are identified. There are atherosclerotic calcifications throughout the aorta. Reproductive: Prostate gland is enlarged. Penile implant is present with reservoir seen in the right inguinal region. There is diffuse scrotal wall edema. There is a 6.3 x 5.5 by 7.2 cm low-attenuation collection surrounding the reservoir in the superior scrotal region with surrounding edema and sent peripheral hyperdensity or calcification of uncertain etiology. Other: There is a small fat containing left inguinal hernia. There is no ascites or free air. There is mild body wall edema. Musculoskeletal: Degenerative changes affect the spine. IMPRESSION: 1. Penile implant with reservoir in the right inguinal region. There is a 7.2 cm low-attenuation collection surrounding the reservoir in the superior scrotal region with surrounding edema and sent peripheral hyperdensity or calcification of uncertain etiology. Findings may represent hematoma, seroma or abscess. 2. Suprapubic Foley catheter decompresses the bladder. Bladder wall thickening is not excluded. Correlate clinically for cystitis. 3. Mild nonspecific bilateral perinephric fat stranding is new from prior. Correlate clinically for infection. 4. Large amount of stool throughout the colon. 5. Small bilateral pleural effusions. 6. Stable infrarenal abdominal  aortic aneurysm measuring 6.8 cm. Patient is status post aorto bi-iliac graft. No periaortic fluid or stranding. Aortic Atherosclerosis (ICD10-I70.0). Electronically Signed   By: Greig Pique M.D.   On: 06/22/2024 19:12   Portable chest 1 View Result Date: 06/21/2024 CLINICAL DATA:  Cough EXAM: PORTABLE CHEST 1 VIEW COMPARISON:  Chest x-ray 12/29/2023.  Chest CT 01/08/2024. FINDINGS: There some peripheral interstitial opacities in the lung bases as seen on prior CT. There is no focal lung infiltrate, pleural effusion or pneumothorax. Heart is mildly enlarged. Aorta is tortuous. No acute fractures are seen. IMPRESSION: 1. No active disease. 2. Mild cardiomegaly. Electronically Signed   By: Greig Pique M.D.   On: 06/21/2024 19:16   CT SOFT TISSUE NECK WO CONTRAST Result Date: 06/19/2024 CLINICAL DATA:  Chronic neck pain radiating to the right shoulder. EXAM: CT NECK WITHOUT CONTRAST TECHNIQUE: Multidetector CT imaging of the neck was performed following the standard protocol without intravenous contrast. RADIATION DOSE REDUCTION: This exam was performed according to the departmental dose-optimization program which includes automated exposure control, adjustment of the mA and/or kV according to patient size and/or use of iterative reconstruction technique. COMPARISON:  High-resolution chest CT 01/08/2024 FINDINGS: Pharynx and larynx: No definite mass or  swelling is identified, however assessment is limited by noncontrast technique and mild-to-moderate motion artifact. Patent airway. No parapharyngeal or retropharyngeal fluid collection or inflammation. Salivary glands: Unremarkable parotid glands. Limited assessment of the submandibular glands due to prominent motion artifact without gross abnormality identified. Thyroid : Unremarkable. Lymph nodes: No enlarged or suspicious lymph nodes in the neck. Vascular: Mild atherosclerotic calcification at the carotid bifurcations. Limited intracranial: Unremarkable.  Visualized orbits: Unremarkable. Mastoids and visualized paranasal sinuses: Mild, chronic appearing inferior mastoid air cell opacification bilaterally. Small mucous retention cyst in the left maxillary sinus. Skeleton: Widespread cervical disc degeneration including severe disc space narrowing and degenerative endplate changes from C3-4 through C6-7. Asymmetric facet arthrosis with facet ankylosis on the right at C2-3 and on the left at C4-5. Widespread neural foraminal stenosis including severe right neural foraminal stenosis at C3-4, moderate right and severe left neural foraminal stenosis at C4-5, and moderate bilateral neural foraminal stenosis at C5-6. At least mild spinal stenosis is suspected at C3-4, C5-6, and C6-7. Upper chest: Partially visualized chronic fibrotic lung disease, more fully evaluated on the prior high-resolution chest CT. 4.6 cm ascending aortic aneurysm and 5.3 cm aneurysm of the proximal descending thoracic aorta, unchanged from the prior chest CT. Aortic and coronary atherosclerosis. Other: None. IMPRESSION: 1. No acute abnormality identified on this motion degraded noncontrast neck CT. 2. Widespread cervical disc degeneration with multilevel neural foraminal stenosis as above. 3. 4.6 cm ascending aortic aneurysm and 5.3 cm proximal descending thoracic aortic aneurysm, unchanged from 01/08/2024. Recommend semi-annual imaging followup by CTA or MRA and referral to cardiothoracic surgery if not already obtained. This recommendation follows 2010 ACCF/AHA/AATS/ACR/ASA/SCA/SCAI/SIR/STS/SVM Guidelines for the Diagnosis and Management of Patients With Thoracic Aortic Disease. Circulation. 2010; 121: Z733-z630. Aortic aneurysm NOS (ICD10-I71.9) 4.  Aortic Atherosclerosis (ICD10-I70.0). Electronically Signed   By: Dasie Hamburg M.D.   On: 06/19/2024 08:45   IR CYSTOSTOMY TUBE PLACEMENT/BLADDER ASPIRATION Result Date: 06/11/2024 INDICATION: Urinary retention, BPH, outflow obstruction, Foley  dependent EXAM: ULTRASOUND AND FLUOROSCOPIC 16 FRENCH SUPRAPUBIC CATHETER PLACEMENT (COUNCIL TIP CATHETER) COMPARISON:  05/24/2024 MEDICATIONS: 1% LIDOCAINE  LOCAL ANESTHESIA/SEDATION: Moderate (conscious) sedation was employed during this procedure. A total of Versed  1.5 mg and Fentanyl  75 mcg was administered intravenously by the radiology nurse. Total intra-service moderate Sedation Time: 7 minutes. The patient's level of consciousness and vital signs were monitored continuously by radiology nursing throughout the procedure under my direct supervision. CONTRAST:  10 cc omni 300-administered into the collecting system(s) FLUOROSCOPY: Radiation Exposure Index (as provided by the fluoroscopic device): 4.0 mGy Kerma COMPLICATIONS: None immediate. PROCEDURE: Informed written consent was obtained from the patient after a thorough discussion of the procedural risks, benefits and alternatives. All questions were addressed. Maximal Sterile Barrier Technique was utilized including caps, mask, sterile gowns, sterile gloves, sterile drape, hand hygiene and skin antiseptic. A timeout was performed prior to the initiation of the procedure. Previous imaging reviewed. Patient positioned supine. Bladder distended with saline through the Foley catheter. Under sterile conditions and local anesthesia, ultrasound percutaneous needle access performed of the bladder dome. Images obtained for documentation. There was return of clear urine. Amplatz guidewire inserted. Tract dilatation performed with a 8 x 100 [employer] balloon. While deflating the balloon, the 16 French balloon tip Council catheter was advanced through the percutaneous tract into the bladder. Deflated balloon and guidewire removed. Foley balloon inflated with 10 cc saline and retracted against the anterior bladder wall. Contrast injection confirms position. Images obtained for documentation. Following the procedure the Foley catheter was removed. Sterile  dressing applied.  Gravity drainage bag connected. No immediate complication. Patient tolerated the procedure well. IMPRESSION: Successful ultrasound and fluoroscopic suprapubic catheter placement (16 French balloon tip Council catheter). Electronically Signed   By: CHRISTELLA.  Shick M.D.   On: 06/11/2024 11:02    Administration History     None           No data to display          No results found for: NITRICOXIDE   Assessment & Plan:  Mr. Burhanuddin Kohlmann is a 89 y/o male with PMH of DVT/PE, chronic diastolic CHF, Afib on Eliquis , chronic respiratory failure, and ILD who presents today for evaluation of dyspnea and cough after a recent hospitalization for sepsis secondary to penile implant infection.  Based on his clinical course and physical exam it appears as though he has an ongoing pneumonia that has not been fully treated.  Edema noted is localized and suspected to be multifactorial in cause:   protein calorie malnutrition, recent surgery, and chronic heart disease are all contributing factors. Assessment & Plan Community acquired pneumonia of left lower lobe of lung -  Complete a full course of oral Augmentin . -  Follow-up imaging to resolution. -  Activity as tolerated encouraged. -  Keep appointment as scheduled with Dr. Geronimo on 07/23/2024. Interstitial lung disease (HCC) -  Esbriet now available for treatment. -  Plan to hold on instituting this medication for now in the setting of his recent hospitalization and ongoing medical issues including active infection -  Reassess at appointment in next 3 weeks. Other fatigue -  Multifactorial in the setting of recent hospitalization, multiple comorbidities and debility Bilateral lower extremity edema -  Improving; some edema expected given recent admission for sepsis -  Check daily weights; may need diuresis but appears to be improving so would hold for now  Severe protein-calorie malnutrition (HCC) -  Contributes to edema given low protein and  albumin  -  Increased protein intake encouraged.  F/u as scheduled with Dr. Geronimo on 07/23/2024.  Candis Dandy, PA-C 07/02/2024

## 2024-07-02 NOTE — Assessment & Plan Note (Signed)
-    Contributes to edema given low protein and albumin  -  Increased protein intake encouraged.

## 2024-07-02 NOTE — Assessment & Plan Note (Signed)
-    Complete a full course of oral Augmentin . -  Follow-up imaging to resolution. -  Activity as tolerated encouraged. -  Keep appointment as scheduled with Dr. Geronimo on 07/23/2024.

## 2024-07-03 ENCOUNTER — Telehealth: Payer: Self-pay

## 2024-07-03 NOTE — Telephone Encounter (Signed)
 Copied from CRM #[id number]. Topic: Clinical - Medication Question >> Jul 03, 2024  1:03 PM Shona S wrote: Reason for CRM: patient wofe would like to know if patient should be taking amoxicillin  and his senna or just amoxicillin , please call and help patient and wife    Spoke with patient it was actually a different med then stated but thankfully they were able to get a hold of the other provider    NFN

## 2024-07-04 ENCOUNTER — Emergency Department (HOSPITAL_COMMUNITY)

## 2024-07-04 ENCOUNTER — Other Ambulatory Visit: Payer: Self-pay

## 2024-07-04 ENCOUNTER — Encounter (HOSPITAL_COMMUNITY): Payer: Self-pay

## 2024-07-04 ENCOUNTER — Ambulatory Visit: Payer: Self-pay | Admitting: Internal Medicine

## 2024-07-04 ENCOUNTER — Inpatient Hospital Stay (HOSPITAL_COMMUNITY)
Admission: EM | Admit: 2024-07-04 | Discharge: 2024-07-29 | DRG: 871 | Disposition: E | Source: Ambulatory Visit | Attending: Internal Medicine | Admitting: Internal Medicine

## 2024-07-04 DIAGNOSIS — I110 Hypertensive heart disease with heart failure: Secondary | ICD-10-CM | POA: Diagnosis present

## 2024-07-04 DIAGNOSIS — T8361XD Infection and inflammatory reaction due to implanted penile prosthesis, subsequent encounter: Secondary | ICD-10-CM

## 2024-07-04 DIAGNOSIS — N401 Enlarged prostate with lower urinary tract symptoms: Secondary | ICD-10-CM | POA: Diagnosis present

## 2024-07-04 DIAGNOSIS — Z7901 Long term (current) use of anticoagulants: Secondary | ICD-10-CM

## 2024-07-04 DIAGNOSIS — M353 Polymyalgia rheumatica: Secondary | ICD-10-CM | POA: Diagnosis present

## 2024-07-04 DIAGNOSIS — J90 Pleural effusion, not elsewhere classified: Secondary | ICD-10-CM | POA: Diagnosis not present

## 2024-07-04 DIAGNOSIS — Z881 Allergy status to other antibiotic agents status: Secondary | ICD-10-CM | POA: Diagnosis not present

## 2024-07-04 DIAGNOSIS — N179 Acute kidney failure, unspecified: Secondary | ICD-10-CM | POA: Diagnosis not present

## 2024-07-04 DIAGNOSIS — Z515 Encounter for palliative care: Secondary | ICD-10-CM | POA: Diagnosis not present

## 2024-07-04 DIAGNOSIS — R6520 Severe sepsis without septic shock: Secondary | ICD-10-CM

## 2024-07-04 DIAGNOSIS — Y733 Surgical instruments, materials and gastroenterology and urology devices (including sutures) associated with adverse incidents: Secondary | ICD-10-CM | POA: Diagnosis present

## 2024-07-04 DIAGNOSIS — J84112 Idiopathic pulmonary fibrosis: Secondary | ICD-10-CM | POA: Diagnosis present

## 2024-07-04 DIAGNOSIS — Z66 Do not resuscitate: Secondary | ICD-10-CM | POA: Diagnosis not present

## 2024-07-04 DIAGNOSIS — Z860100 Personal history of colon polyps, unspecified: Secondary | ICD-10-CM

## 2024-07-04 DIAGNOSIS — I739 Peripheral vascular disease, unspecified: Secondary | ICD-10-CM | POA: Diagnosis present

## 2024-07-04 DIAGNOSIS — I5032 Chronic diastolic (congestive) heart failure: Secondary | ICD-10-CM | POA: Diagnosis not present

## 2024-07-04 DIAGNOSIS — J440 Chronic obstructive pulmonary disease with acute lower respiratory infection: Secondary | ICD-10-CM | POA: Diagnosis present

## 2024-07-04 DIAGNOSIS — D709 Neutropenia, unspecified: Secondary | ICD-10-CM | POA: Diagnosis present

## 2024-07-04 DIAGNOSIS — J189 Pneumonia, unspecified organism: Secondary | ICD-10-CM | POA: Diagnosis present

## 2024-07-04 DIAGNOSIS — Z79899 Other long term (current) drug therapy: Secondary | ICD-10-CM

## 2024-07-04 DIAGNOSIS — N400 Enlarged prostate without lower urinary tract symptoms: Secondary | ICD-10-CM | POA: Diagnosis not present

## 2024-07-04 DIAGNOSIS — E785 Hyperlipidemia, unspecified: Secondary | ICD-10-CM | POA: Diagnosis present

## 2024-07-04 DIAGNOSIS — Z806 Family history of leukemia: Secondary | ICD-10-CM

## 2024-07-04 DIAGNOSIS — H9190 Unspecified hearing loss, unspecified ear: Secondary | ICD-10-CM | POA: Diagnosis present

## 2024-07-04 DIAGNOSIS — Z86718 Personal history of other venous thrombosis and embolism: Secondary | ICD-10-CM

## 2024-07-04 DIAGNOSIS — R6521 Severe sepsis with septic shock: Secondary | ICD-10-CM | POA: Diagnosis present

## 2024-07-04 DIAGNOSIS — Z1152 Encounter for screening for COVID-19: Secondary | ICD-10-CM | POA: Diagnosis not present

## 2024-07-04 DIAGNOSIS — N281 Cyst of kidney, acquired: Secondary | ICD-10-CM | POA: Diagnosis not present

## 2024-07-04 DIAGNOSIS — A419 Sepsis, unspecified organism: Principal | ICD-10-CM | POA: Diagnosis present

## 2024-07-04 DIAGNOSIS — Z86711 Personal history of pulmonary embolism: Secondary | ICD-10-CM

## 2024-07-04 DIAGNOSIS — R918 Other nonspecific abnormal finding of lung field: Secondary | ICD-10-CM | POA: Diagnosis not present

## 2024-07-04 DIAGNOSIS — Z825 Family history of asthma and other chronic lower respiratory diseases: Secondary | ICD-10-CM

## 2024-07-04 DIAGNOSIS — E860 Dehydration: Secondary | ICD-10-CM | POA: Diagnosis present

## 2024-07-04 DIAGNOSIS — Z9981 Dependence on supplemental oxygen: Secondary | ICD-10-CM

## 2024-07-04 DIAGNOSIS — Z7952 Long term (current) use of systemic steroids: Secondary | ICD-10-CM

## 2024-07-04 DIAGNOSIS — J690 Pneumonitis due to inhalation of food and vomit: Secondary | ICD-10-CM | POA: Diagnosis not present

## 2024-07-04 DIAGNOSIS — E8729 Other acidosis: Secondary | ICD-10-CM | POA: Diagnosis not present

## 2024-07-04 DIAGNOSIS — Z8249 Family history of ischemic heart disease and other diseases of the circulatory system: Secondary | ICD-10-CM | POA: Diagnosis not present

## 2024-07-04 DIAGNOSIS — E872 Acidosis, unspecified: Secondary | ICD-10-CM | POA: Diagnosis present

## 2024-07-04 DIAGNOSIS — J849 Interstitial pulmonary disease, unspecified: Secondary | ICD-10-CM | POA: Diagnosis not present

## 2024-07-04 DIAGNOSIS — J810 Acute pulmonary edema: Secondary | ICD-10-CM

## 2024-07-04 DIAGNOSIS — R0902 Hypoxemia: Secondary | ICD-10-CM | POA: Diagnosis not present

## 2024-07-04 DIAGNOSIS — Z885 Allergy status to narcotic agent status: Secondary | ICD-10-CM

## 2024-07-04 DIAGNOSIS — R509 Fever, unspecified: Secondary | ICD-10-CM | POA: Diagnosis not present

## 2024-07-04 DIAGNOSIS — Z8679 Personal history of other diseases of the circulatory system: Secondary | ICD-10-CM

## 2024-07-04 DIAGNOSIS — J9621 Acute and chronic respiratory failure with hypoxia: Secondary | ICD-10-CM | POA: Diagnosis present

## 2024-07-04 DIAGNOSIS — R0602 Shortness of breath: Secondary | ICD-10-CM | POA: Diagnosis not present

## 2024-07-04 DIAGNOSIS — R338 Other retention of urine: Secondary | ICD-10-CM | POA: Diagnosis present

## 2024-07-04 DIAGNOSIS — I480 Paroxysmal atrial fibrillation: Secondary | ICD-10-CM | POA: Diagnosis not present

## 2024-07-04 DIAGNOSIS — K7689 Other specified diseases of liver: Secondary | ICD-10-CM | POA: Diagnosis not present

## 2024-07-04 LAB — CBC WITH DIFFERENTIAL/PLATELET
Abs Immature Granulocytes: 0.36 K/uL — ABNORMAL HIGH (ref 0.00–0.07)
Basophils Absolute: 0.1 K/uL (ref 0.0–0.1)
Basophils Relative: 1 %
Eosinophils Absolute: 0.1 K/uL (ref 0.0–0.5)
Eosinophils Relative: 1 %
HCT: 34.7 % — ABNORMAL LOW (ref 39.0–52.0)
Hemoglobin: 11.1 g/dL — ABNORMAL LOW (ref 13.0–17.0)
Immature Granulocytes: 2 %
Lymphocytes Relative: 3 %
Lymphs Abs: 0.6 K/uL — ABNORMAL LOW (ref 0.7–4.0)
MCH: 33.6 pg (ref 26.0–34.0)
MCHC: 32 g/dL (ref 30.0–36.0)
MCV: 105.2 fL — ABNORMAL HIGH (ref 80.0–100.0)
Monocytes Absolute: 1 K/uL (ref 0.1–1.0)
Monocytes Relative: 5 %
Neutro Abs: 18.7 K/uL — ABNORMAL HIGH (ref 1.7–7.7)
Neutrophils Relative %: 88 %
Platelets: 427 K/uL — ABNORMAL HIGH (ref 150–400)
RBC: 3.3 MIL/uL — ABNORMAL LOW (ref 4.22–5.81)
RDW: 18.6 % — ABNORMAL HIGH (ref 11.5–15.5)
WBC: 20.9 K/uL — ABNORMAL HIGH (ref 4.0–10.5)
nRBC: 0 % (ref 0.0–0.2)

## 2024-07-04 LAB — URINALYSIS, W/ REFLEX TO CULTURE (INFECTION SUSPECTED)
Bilirubin Urine: NEGATIVE
Glucose, UA: NEGATIVE mg/dL
Ketones, ur: NEGATIVE mg/dL
Nitrite: NEGATIVE
Protein, ur: 30 mg/dL — AB
Specific Gravity, Urine: 1.017 (ref 1.005–1.030)
WBC, UA: >50 WBC/hpf (ref 0–5)
pH: 5 (ref 5.0–8.0)

## 2024-07-04 LAB — BLOOD GAS, ARTERIAL
Acid-base deficit: 0.9 mmol/L (ref 0.0–2.0)
Bicarbonate: 21.8 mmol/L (ref 20.0–28.0)
O2 Saturation: 88.4 %
Patient temperature: 39.2
pCO2 arterial: 33 mmHg (ref 32–48)
pH, Arterial: 7.44 (ref 7.35–7.45)
pO2, Arterial: 62 mmHg — ABNORMAL LOW (ref 83–108)

## 2024-07-04 LAB — RESPIRATORY PANEL BY PCR

## 2024-07-04 LAB — COMPREHENSIVE METABOLIC PANEL WITH GFR
ALT: 30 U/L (ref 0–44)
AST: 22 U/L (ref 15–41)
Albumin: 2.8 g/dL — ABNORMAL LOW (ref 3.5–5.0)
Alkaline Phosphatase: 65 U/L (ref 38–126)
Anion gap: 11 (ref 5–15)
BUN: 42 mg/dL — ABNORMAL HIGH (ref 8–23)
CO2: 26 mmol/L (ref 22–32)
Calcium: 9.2 mg/dL (ref 8.9–10.3)
Chloride: 97 mmol/L — ABNORMAL LOW (ref 98–111)
Creatinine, Ser: 1.37 mg/dL — ABNORMAL HIGH (ref 0.61–1.24)
GFR, Estimated: 49 mL/min — ABNORMAL LOW (ref >60)
Glucose, Bld: 131 mg/dL — ABNORMAL HIGH (ref 70–99)
Potassium: 4.7 mmol/L (ref 3.5–5.1)
Sodium: 134 mmol/L — ABNORMAL LOW (ref 135–145)
Total Bilirubin: 0.6 mg/dL (ref 0.0–1.2)
Total Protein: 6.1 g/dL — ABNORMAL LOW (ref 6.5–8.1)

## 2024-07-04 LAB — I-STAT CG4 LACTIC ACID, ED
Lactic Acid, Venous: 2 mmol/L (ref 0.5–1.9)
Lactic Acid, Venous: 2.4 mmol/L (ref 0.5–1.9)

## 2024-07-04 LAB — BRAIN NATRIURETIC PEPTIDE: B Natriuretic Peptide: 123.4 pg/mL — ABNORMAL HIGH (ref 0.0–100.0)

## 2024-07-04 LAB — RESP PANEL BY RT-PCR (RSV, FLU A&B, COVID)  RVPGX2
Influenza A by PCR: NEGATIVE
Influenza B by PCR: NEGATIVE
Resp Syncytial Virus by PCR: NEGATIVE
SARS Coronavirus 2 by RT PCR: NEGATIVE

## 2024-07-04 LAB — LACTIC ACID, PLASMA: Lactic Acid, Venous: 3.2 mmol/L (ref 0.5–1.9)

## 2024-07-04 LAB — AMMONIA: Ammonia: 28 umol/L (ref 9–35)

## 2024-07-04 LAB — PROTIME-INR
INR: 1.2 (ref 0.8–1.2)
Prothrombin Time: 15.8 s — ABNORMAL HIGH (ref 11.4–15.2)

## 2024-07-04 MED ORDER — LORAZEPAM 1 MG PO TABS
1.0000 mg | ORAL_TABLET | Freq: Once | ORAL | Status: AC
  Administered 2024-07-04: 1 mg via ORAL
  Filled 2024-07-04: qty 1

## 2024-07-04 MED ORDER — IOHEXOL 350 MG/ML SOLN
75.0000 mL | Freq: Once | INTRAVENOUS | Status: AC | PRN
Start: 2024-07-04 — End: 2024-07-04
  Administered 2024-07-04: 75 mL via INTRAVENOUS

## 2024-07-04 MED ORDER — LACTATED RINGERS IV BOLUS (SEPSIS)
1000.0000 mL | Freq: Once | INTRAVENOUS | Status: AC
  Administered 2024-07-04: 1000 mL via INTRAVENOUS

## 2024-07-04 MED ORDER — ENSURE ENLIVE PO LIQD
237.0000 mL | Freq: Three times a day (TID) | ORAL | Status: DC
  Filled 2024-07-04 (×3): qty 237

## 2024-07-04 MED ORDER — MIRTAZAPINE 15 MG PO TABS: 30.0000 mg | ORAL_TABLET | Freq: Every day | ORAL | Status: DC

## 2024-07-04 MED ORDER — ACETAMINOPHEN 500 MG PO TABS: 500.0000 mg | ORAL_TABLET | Freq: Three times a day (TID) | ORAL | Status: DC | PRN

## 2024-07-04 MED ORDER — DEXTROSE IN LACTATED RINGERS 5 % IV SOLN: INTRAVENOUS | Status: DC

## 2024-07-04 MED ORDER — OXYCODONE HCL 5 MG PO TABS
5.0000 mg | ORAL_TABLET | Freq: Four times a day (QID) | ORAL | Status: DC | PRN
  Administered 2024-07-04: 5 mg via ORAL
  Filled 2024-07-04: qty 1

## 2024-07-04 MED ORDER — HALOPERIDOL LACTATE 5 MG/ML IJ SOLN
2.0000 mg | Freq: Once | INTRAMUSCULAR | Status: AC
  Administered 2024-07-04: 2 mg via INTRAVENOUS
  Filled 2024-07-04: qty 1

## 2024-07-04 MED ORDER — LINEZOLID 600 MG/300ML IV SOLN: 600.0000 mg | Freq: Two times a day (BID) | INTRAVENOUS | Status: DC

## 2024-07-04 MED ORDER — LORAZEPAM 1 MG PO TABS
0.5000 mg | ORAL_TABLET | Freq: Every day | ORAL | Status: DC
Start: 2024-07-04 — End: 2024-07-05
  Administered 2024-07-04: 0.5 mg via ORAL

## 2024-07-04 MED ORDER — PANTOPRAZOLE SODIUM 40 MG PO TBEC: 40.0000 mg | DELAYED_RELEASE_TABLET | Freq: Every day | ORAL | Status: DC

## 2024-07-04 MED ORDER — FLUOROMETHOLONE 0.1 % OP SUSP
1.0000 [drp] | Freq: Every day | OPHTHALMIC | Status: DC
  Filled 2024-07-04: qty 5

## 2024-07-04 MED ORDER — VANCOMYCIN HCL IN DEXTROSE 1-5 GM/200ML-% IV SOLN: 1000.0000 mg | Freq: Once | INTRAVENOUS | Status: DC

## 2024-07-04 MED ORDER — SENNOSIDES-DOCUSATE SODIUM 8.6-50 MG PO TABS: 1.0000 | ORAL_TABLET | Freq: Every morning | ORAL | Status: DC

## 2024-07-04 MED ORDER — METRONIDAZOLE 500 MG/100ML IV SOLN
500.0000 mg | Freq: Two times a day (BID) | INTRAVENOUS | Status: DC
  Administered 2024-07-05: 500 mg via INTRAVENOUS
  Filled 2024-07-04: qty 100

## 2024-07-04 MED ORDER — VANCOMYCIN HCL 1500 MG/300ML IV SOLN
1500.0000 mg | Freq: Once | INTRAVENOUS | Status: AC
  Administered 2024-07-04: 1500 mg via INTRAVENOUS
  Filled 2024-07-04: qty 300

## 2024-07-04 MED ORDER — LORAZEPAM 1 MG PO TABS
0.5000 mg | ORAL_TABLET | Freq: Two times a day (BID) | ORAL | Status: DC | PRN
  Filled 2024-07-04: qty 1

## 2024-07-04 MED ORDER — LIDOCAINE 5 % EX PTCH: 1.0000 | MEDICATED_PATCH | Freq: Every day | CUTANEOUS | Status: DC | PRN

## 2024-07-04 MED ORDER — LINEZOLID 600 MG/300ML IV SOLN
600.0000 mg | Freq: Two times a day (BID) | INTRAVENOUS | Status: DC
  Filled 2024-07-04: qty 300

## 2024-07-04 MED ORDER — LACTATED RINGERS IV BOLUS (SEPSIS)
500.0000 mL | Freq: Once | INTRAVENOUS | Status: AC
  Administered 2024-07-04: 500 mL via INTRAVENOUS

## 2024-07-04 MED ORDER — PREDNISONE 10 MG PO TABS: 10.0000 mg | ORAL_TABLET | Freq: Every day | ORAL | Status: DC

## 2024-07-04 MED ORDER — PREDNISONE 5 MG PO TABS
12.5000 mg | ORAL_TABLET | Freq: Every day | ORAL | Status: DC
Start: 2024-07-05 — End: 2024-07-05
  Filled 2024-07-04: qty 1

## 2024-07-04 MED ORDER — FOLIC ACID 1 MG PO TABS: 1.0000 mg | ORAL_TABLET | Freq: Every day | ORAL | Status: DC

## 2024-07-04 MED ORDER — PREDNISONE 5 MG PO TABS: 2.5000 mg | ORAL_TABLET | Freq: Every day | ORAL | Status: DC

## 2024-07-04 MED ORDER — APIXABAN 2.5 MG PO TABS: 2.5000 mg | ORAL_TABLET | Freq: Two times a day (BID) | ORAL | Status: DC

## 2024-07-04 MED ORDER — GUAIFENESIN-DM 100-10 MG/5ML PO SYRP: 5.0000 mL | ORAL_SOLUTION | ORAL | Status: DC | PRN

## 2024-07-04 MED ORDER — SODIUM CHLORIDE 0.9 % IV SOLN
2.0000 g | Freq: Once | INTRAVENOUS | Status: AC
  Administered 2024-07-04: 2 g via INTRAVENOUS
  Filled 2024-07-04: qty 12.5

## 2024-07-04 MED ORDER — LACTATED RINGERS IV SOLN: INTRAVENOUS | Status: DC

## 2024-07-04 MED ORDER — LORAZEPAM 0.5 MG PO TABS: 0.5000 mg | ORAL_TABLET | ORAL | Status: DC

## 2024-07-04 MED ORDER — FINASTERIDE 5 MG PO TABS: 5.0000 mg | ORAL_TABLET | Freq: Every day | ORAL | Status: DC

## 2024-07-04 MED ORDER — METRONIDAZOLE 500 MG/100ML IV SOLN
500.0000 mg | Freq: Once | INTRAVENOUS | Status: AC
  Administered 2024-07-04: 500 mg via INTRAVENOUS
  Filled 2024-07-04: qty 100

## 2024-07-04 NOTE — ED Notes (Signed)
 Pt to and from CT

## 2024-07-04 NOTE — Sepsis Progress Note (Signed)
 First documented time of blood cultures was 1523

## 2024-07-04 NOTE — Telephone Encounter (Signed)
 Spoke with pt.'s wife and she states she talked with doctor and they will take pt. To ED. [facility] will be bringing a face mask to home.

## 2024-07-04 NOTE — ED Triage Notes (Signed)
 PT sent from pulmonologist for possible sepsis. Pt is on antibiotics for pneumonia and sats have been low, sob, and fever. PT is on o2 at all times but requiring more

## 2024-07-04 NOTE — Progress Notes (Signed)
 Received critical lab for lactic acid of 3.2. Made on-call physician aware.

## 2024-07-04 NOTE — ED Provider Triage Note (Signed)
 Emergency Medicine Provider Triage Evaluation Note  [name] , a 89 y.o. male  was evaluated in triage.  Pt complains of sepsis.  89 year old male with history of AAA, PE currently on Eliquis , polymyalgia rheumatica, interstitial lung disease sent here from pulmonology office with concerns of sepsis.  Patient is having increasing shortness of breath and pain in his chest along with chills.  10 days ago he had a penile implant removed.  He is currently having a Foley catheter.  He denies any second abdominal pain or urinary discomfort.  Review of Systems  Positive: As above Negative: As above  Physical Exam  BP 94/75   Pulse 100   Temp 97.8 F (36.6 C)   Resp 15   Ht 6' (1.829 m)   Wt 80.7 kg   SpO2 100%   BMI 24.14 kg/m  Gen:   Awake, no distress   Resp:  Normal effort  MSK:   Moves extremities without difficulty  Other:    Medical Decision Making  Medically screening exam initiated at 2:56 PM.  Appropriate orders placed.  [name] was informed that the remainder of the evaluation will be completed by another provider, this initial triage assessment does not replace that evaluation, and the importance of remaining in the ED until their evaluation is complete.  Activate code sepsis, and give broad spectrum abx   Randall Pondexter, PA-C 07/04/24 1456

## 2024-07-04 NOTE — H&P (Signed)
 History and Physical    Patient: [name] FMW:[id number] DOB: 01-30-1930 DOA: 07/04/2024 DOS: the patient was seen and examined on 07/04/2024 PCP: Shayne Anes, MD  Patient coming from: Home  Chief Complaint:  Chief Complaint  Patient presents with   Chills   Shortness of Breath   HPI: [name] is a 89 y.o. male with medical history significant of ILD, AAA, PVD, was brought in for chills, rigors and fever since one day. Patient slightly confused and most of the history is available from the family. As per the family, had a penile implant removed 10 days ago and was on ciprofloxacin  for 2 weeks for UTI, underwent supra pubic catheter placement. He reports having cough and congestion and some sob for a day. No dysuria other than the area of supra pubic catheter placement. He denies any nausea or vomiting or abdominal pain or diarrhea. He denies any headache, dizziness.  On arrial to ED. He was found to have low grade temp of 99, HR of 112/min, tachypnea of 21/min and normotensive.   Labs are significant for wbc count of 20,900, hemoglobin of 11, platelet count of 427000, creatinine of 1.37 and BUN of 42. Bnp of 123.4, lactic acid of 2.4.   CT angio of the chest showed  Interval increase in interlobular septal thickening with slight enlargement of bilateral pleural effusions, compatible with developing pulmonary edema superimposed upon findings of chronic UIP.  Pccm consulted for further evaluation. Deferred admission to [facility] for admission for sepsis    Review of Systems: As mentioned in the history of present illness. All other systems reviewed and are negative. Past Medical History:  Diagnosis Date   AAA (abdominal aortic aneurysm) (HCC)    Abnormal EKG    Left atrial abnormality   Allergic rhinitis    Allergy    Benign neoplasm of colon 04/14/2010   3 small polyps on colonoscopy by Dr. [name]   Carpal tunnel syndrome    Cataract    Decubitus ulcer of coccygeal region,  stage 2 (HCC) 07/06/2023   Degenerative disc disease    Disturbances metabolism of methionine, homocystine, and cystathionine (HCC)    Elevated homocysteine   Elevated prostate specific antigen (PSA)    Elevated PSA 07/06/2023   Hearing loss    Hyperlipidemia    ILD (interstitial lung disease) (HCC) 07/06/2023   Impotence of organic origin    Penile implant   Internal hemorrhoids    Leukocytosis 07/06/2023   Neck pain    Otosclerosis of both ears    Peripheral vascular disease (HCC)    Bilateral femoral bruit   Plantar fasciitis    Polymyalgia rheumatica (HCC)    Polymyalgia rheumatica (HCC) 07/06/2023   Rotator cuff syndrome of left shoulder    Scoliosis    Shoulder pain    Past Surgical History:  Procedure Laterality Date   ABDOMINAL AORTIC ENDOVASCULAR STENT GRAFT N/A 08/16/2017   Procedure: ABDOMINAL AORTIC ENDOVASCULAR STENT GRAFT insertion;  Surgeon: Serene Gaile ORN, MD;  Location: [facility];  Service: Vascular;  Laterality: N/A;   Actinic keratosis removal  06/21/2010   Left shoulder; Dr. Jadine   COLONOSCOPY     COLONOSCOPY W/ POLYPECTOMY     CYSTOSCOPY N/A 06/23/2024   Procedure: PHYLLIS SIDE;  Surgeon: [name], MD;  Location: [facility];  Service: Urology;  Laterality: N/A;   DUPUYTREN CONTRACTURE RELEASE Right 12/05/2013   Procedure: DUPUYTREN CONTRACTURE RELEASE RIGHT LONG, RING AND SMALL FINGERS;  Surgeon: Lamar  LULLA Leonor Raddle., MD;  Location: [facility];  Service: Orthopedics;  Laterality: Right;   Implant penile pump  2001   IR ANGIOGRAM PELVIS SELECTIVE OR SUPRASELECTIVE  12/11/2020   IR ANGIOGRAM SELECTIVE EACH ADDITIONAL VESSEL  12/11/2020   IR CYSTOSTOMY TUBE PLACEMENT/BLADDER ASPIRATION  06/11/2024   IR EMBO ARTERIAL NOT HEMORR HEMANG INC GUIDE ROADMAPPING  12/11/2020   IR RADIOLOGIST EVAL & MGMT  07/30/2020   IR RADIOLOGIST EVAL & MGMT  08/16/2023   IR US  GUIDE VASC ACCESS RIGHT  12/11/2020   REMOVAL OF PENILE PROSTHESIS N/A 06/23/2024    Procedure: REMOVAL, PENILE PROSTHESIS;  Surgeon: [name], MD;  Location: [facility];  Service: Urology;  Laterality: N/A;   Resection of appendix and tip of rectum  February 2005   SCROTAL EXPLORATION N/A 06/23/2024   Procedure: EXPLORATION, SCROTUM;  Surgeon: [name], MD;  Location: [facility];  Service: Urology;  Laterality: N/A;  REMOVAL INFECTED PENILE PROSTHESIS; POSSIBLE CYSTOSCOPY   STAPEDECTOMY Bilateral 1985, 1988   [employer]   Social History:  reports that he has never smoked. He has never used smokeless tobacco. He reports that he does not drink alcohol  and does not use drugs.  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   Denosumab  Other (See Comments)    PROLIA  - Bad reaction 12/2023- will not receive this again    Rosuvastatin  Other (See Comments)    Stopped taking due to feeling achy     Doxycycline  Other (See Comments)    Upset stomach, currently taking and tolerating well (??)   Morphine  And Codeine Nausea And Vomiting   Valium [Diazepam] Other (See Comments)    Caused TOO MUCH sedation    Family History  Problem Relation Age of Onset   Heart disease Mother    Emphysema Mother    Leukemia Brother        Chronic lymphocytic leukemia   Colon cancer Neg Hx    Esophageal cancer Neg Hx    Rectal cancer Neg Hx    Stomach cancer Neg Hx     Prior to Admission medications   Medication Sig Start Date End Date Taking? Authorizing Provider  Acetaminophen  500 MG capsule Take 500-1,000 mg by mouth See admin instructions. Tylenol  Rapid Release 500 mg capsules- Take 500-1,000 mg by mouth every eight hours as needed for pain    [provider]  acyclovir  (ZOVIRAX ) 400 MG tablet Take 400 mg by mouth 2 (two) times daily. 08/12/23   [provider]  amoxicillin -clavulanate (AUGMENTIN ) 875-125 MG tablet Take 1 tablet by mouth 2 (two) times daily for 7 days. 07/02/24 07/09/24  Charley Conger,  PA-C  apixaban  (ELIQUIS ) 2.5 MG TABS tablet Take 2.5 mg by mouth 2 (two) times daily. 05/09/23   [provider]  butalbital -acetaminophen -caffeine  (FIORICET ) 50-325-40 MG tablet Take 1 tablet by mouth daily as needed for headache. 03/23/23   Sebastian Toribio LULLA, MD  cholecalciferol (VITAMIN D3) 25 MCG (1000 UNIT) tablet Take 2,000 Units by mouth daily.    [provider]  ciprofloxacin  (CIPRO ) 500 MG tablet Take 1 tablet (500 mg total) by mouth 2 (two) times daily for 14 days. 06/26/24 07/10/24  Drusilla Sabas RAMAN, MD  diclofenac  Sodium (VOLTAREN ) 1 % GEL Apply 2 g topically 4 (four) times daily as needed (shoulder pain). 03/23/23   Sebastian Toribio LULLA, MD  dicyclomine  (BENTYL ) 10 MG/5ML solution Take 20 mg by  mouth 3 (three) times daily as needed (for cramping or abdominal pain).    [provider]  docusate sodium  (COLACE) 100 MG capsule Take 100 mg by mouth at bedtime.    [provider]  feeding supplement (ENSURE ENLIVE / ENSURE PLUS) LIQD Take 237 mLs by mouth 3 (three) times daily between meals. 03/23/23   Sebastian Toribio GAILS, MD  finasteride  (PROSCAR ) 5 MG tablet Take 5 mg by mouth daily. 05/17/23   [provider]  fluorometholone  (FML) 0.1 % ophthalmic suspension Place 1 drop into the right eye at bedtime.     [provider]  folic acid  (FOLVITE ) 1 MG tablet Take 1 mg by mouth daily.    [provider]  lansoprazole (PREVACID) 30 MG capsule Take 30 mg by mouth 2 (two) times daily before a meal.    [provider]  lidocaine  (LIDODERM ) 5 % Place 1 patch onto the skin daily as needed (for pain- Remove & Discard patch within 12 hours or as directed by MD).    [provider]  LORazepam  (ATIVAN ) 0.5 MG tablet Take 0.5 mg by mouth See admin instructions. Take 0.5 mg by mouth at bedtime and an additional 0.5 mg up to twice a day as needed for anxiety 04/01/23   [provider]  melatonin 5 MG TABS Take 1 tablet (5 mg total)  by mouth at bedtime. 03/08/24   Dorsey, John T IV, MD  methotrexate (RHEUMATREX) 2.5 MG tablet Take 12.5 mg by mouth every Friday. 02/15/24 08/13/24  [provider]  mirtazapine  (REMERON ) 15 MG tablet Take 30 mg by mouth at bedtime.    [provider]  Multiple Vitamins-Minerals (MULTIVITAMIN PO) Take 1 tablet by mouth daily.     [provider]  oxyCODONE  (OXY IR/ROXICODONE ) 5 MG immediate release tablet Take 1 tablet (5 mg total) by mouth every 4 (four) hours as needed for moderate pain (pain score 4-6). 06/26/24   Drusilla Sabas RAMAN, MD  polyethylene glycol (MIRALAX  / GLYCOLAX ) 17 g packet Take 17 g by mouth in the morning and at bedtime.    [provider]  predniSONE  (DELTASONE ) 10 MG tablet Take 10 mg by mouth daily with breakfast. 02/15/24 08/13/24  [provider]  predniSONE  (DELTASONE ) 2.5 MG tablet Take 2.5 mg by mouth daily with breakfast.    [provider]  pregabalin  (LYRICA ) 75 MG capsule Take 75 mg by mouth at bedtime.    [provider]  senna (SENOKOT) 8.6 MG TABS tablet Take 1 tablet (8.6 mg total) by mouth daily as needed for mild constipation. 06/26/24   Drusilla Sabas RAMAN, MD  senna-docusate (SENNA-S) 8.6-50 MG tablet Take 1 tablet by mouth in the morning.    [provider]  tamsulosin  (FLOMAX ) 0.4 MG CAPS capsule Take 0.8 mg by mouth at bedtime. 06/10/20   [provider]  zolpidem  (AMBIEN ) 5 MG tablet Take 5 mg by mouth at bedtime.    [provider]    Physical Exam: Vitals:   07/04/24 1645 07/04/24 1705 07/04/24 1743 07/04/24 1800  BP:  (!) 112/58  (!) 109/50  Pulse: 88 87 90 90  Resp: 15 15 19 17   Temp:   98.1 F (36.7 C)   SpO2: 100% 100% 100% 100%  Weight:      Height:       General exam: Ill appearing gentleman, not in distress.coughing a little.  Respiratory system: diminished air entry at bases, on 1.5 lit of [place]  oxygen .  Cardiovascular system: S1 & S2 heard, RRR. No JVD,   Gastrointestinal system: Abdomen is nondistended, soft and nontender.  Central nervous system: Alert and oriented to person and place.  Extremities: No pedal edema.  Skin: No rashes,  Psychiatry:  Mood & affect appropriate.   Data Reviewed: Results for orders placed or performed during the hospital encounter of 07/04/24 (from the past 24 hours)  Comprehensive metabolic panel     Status: Abnormal   Collection Time: 07/04/24  3:22 PM  Result Value Ref Range   Sodium 134 (L) 135 - 145 mmol/L   Potassium 4.7 3.5 - 5.1 mmol/L   Chloride 97 (L) 98 - 111 mmol/L   CO2 26 22 - 32 mmol/L   Glucose, Bld 131 (H) 70 - 99 mg/dL   BUN 42 (H) 8 - 23 mg/dL   Creatinine, Ser 8.62 (H) 0.61 - 1.24 mg/dL   Calcium  9.2 8.9 - 10.3 mg/dL   Total Protein 6.1 (L) 6.5 - 8.1 g/dL   Albumin  2.8 (L) 3.5 - 5.0 g/dL   AST 22 15 - 41 U/L   ALT 30 0 - 44 U/L   Alkaline Phosphatase 65 38 - 126 U/L   Total Bilirubin 0.6 0.0 - 1.2 mg/dL   GFR, Estimated 49 (L) >60 mL/min   Anion gap 11 5 - 15  CBC with Differential     Status: Abnormal   Collection Time: 07/04/24  3:22 PM  Result Value Ref Range   WBC 20.9 (H) 4.0 - 10.5 K/uL   RBC 3.30 (L) 4.22 - 5.81 MIL/uL   Hemoglobin 11.1 (L) 13.0 - 17.0 g/dL   HCT 65.2 (L) 60.9 - 47.9 %   MCV 105.2 (H) 80.0 - 100.0 fL   MCH 33.6 26.0 - 34.0 pg   MCHC 32.0 30.0 - 36.0 g/dL   RDW 81.3 (H) 88.4 - 84.4 %   Platelets 427 (H) 150 - 400 K/uL   nRBC 0.0 0.0 - 0.2 %   Neutrophils Relative % 88 %   Neutro Abs 18.7 (H) 1.7 - 7.7 K/uL   Lymphocytes Relative 3 %   Lymphs Abs 0.6 (L) 0.7 - 4.0 K/uL   Monocytes Relative 5 %   Monocytes Absolute 1.0 0.1 - 1.0 K/uL   Eosinophils Relative 1 %   Eosinophils Absolute 0.1 0.0 - 0.5 K/uL   Basophils Relative 1 %   Basophils Absolute 0.1 0.0 - 0.1 K/uL   Immature Granulocytes 2 %   Abs Immature Granulocytes 0.36 (H) 0.00 - 0.07 K/uL  Protime-INR     Status: Abnormal   Collection Time: 07/04/24  3:22 PM  Result Value Ref Range    Prothrombin Time 15.8 (H) 11.4 - 15.2 seconds   INR 1.2 0.8 - 1.2  Urinalysis, w/ Reflex to Culture (Infection Suspected) -Urine, Clean Catch     Status: Abnormal   Collection Time: 07/04/24  3:28 PM  Result Value Ref Range   Specimen Source URINE, CATHETERIZED    Color, Urine AMBER (A) YELLOW   APPearance CLOUDY (A) CLEAR   Specific Gravity, Urine 1.017 1.005 - 1.030   pH 5.0 5.0 - 8.0   Glucose, UA NEGATIVE NEGATIVE mg/dL   Hgb urine dipstick MODERATE (A) NEGATIVE   Bilirubin Urine NEGATIVE NEGATIVE   Ketones, ur NEGATIVE NEGATIVE mg/dL   Protein, ur 30 (A) NEGATIVE mg/dL   Nitrite NEGATIVE NEGATIVE   Leukocytes,Ua LARGE (A) NEGATIVE   RBC / HPF 21-50 0 - 5 RBC/hpf  WBC, UA >50 0 - 5 WBC/hpf   Bacteria, UA RARE (A) NONE SEEN   Squamous Epithelial / HPF 0-5 0 - 5 /HPF   WBC Clumps PRESENT    Mucus PRESENT    Budding Yeast PRESENT   Resp panel by RT-PCR (RSV, Flu A&B, Covid)     Status: None   Collection Time: 07/04/24  3:28 PM   Specimen: Nasal Swab  Result Value Ref Range   SARS Coronavirus 2 by RT PCR NEGATIVE NEGATIVE   Influenza A by PCR NEGATIVE NEGATIVE   Influenza B by PCR NEGATIVE NEGATIVE   Resp Syncytial Virus by PCR NEGATIVE NEGATIVE  Brain natriuretic peptide     Status: Abnormal   Collection Time: 07/04/24  3:28 PM  Result Value Ref Range   B Natriuretic Peptide 123.4 (H) 0.0 - 100.0 pg/mL  Respiratory (~20 pathogens) panel by PCR     Status: None   Collection Time: 07/04/24  3:28 PM   Specimen: Nasopharyngeal Swab; Respiratory  Result Value Ref Range   Adenovirus NOT DETECTED NOT DETECTED   Coronavirus 229E NOT DETECTED NOT DETECTED   Coronavirus HKU1 NOT DETECTED NOT DETECTED   Coronavirus NL63 NOT DETECTED NOT DETECTED   Coronavirus OC43 NOT DETECTED NOT DETECTED   Metapneumovirus NOT DETECTED NOT DETECTED   Rhinovirus / Enterovirus NOT DETECTED NOT DETECTED   Influenza A NOT DETECTED NOT DETECTED   Influenza B NOT DETECTED NOT DETECTED    Parainfluenza Virus 1 NOT DETECTED NOT DETECTED   Parainfluenza Virus 2 NOT DETECTED NOT DETECTED   Parainfluenza Virus 3 NOT DETECTED NOT DETECTED   Parainfluenza Virus 4 NOT DETECTED NOT DETECTED   Respiratory Syncytial Virus NOT DETECTED NOT DETECTED   Bordetella pertussis NOT DETECTED NOT DETECTED   Bordetella Parapertussis NOT DETECTED NOT DETECTED   Chlamydophila pneumoniae NOT DETECTED NOT DETECTED   Mycoplasma pneumoniae NOT DETECTED NOT DETECTED  I-Stat Lactic Acid, ED     Status: Abnormal   Collection Time: 07/04/24  3:36 PM  Result Value Ref Range   Lactic Acid, Venous 2.0 (HH) 0.5 - 1.9 mmol/L  I-Stat Lactic Acid, ED     Status: Abnormal   Collection Time: 07/04/24  5:51 PM  Result Value Ref Range   Lactic Acid, Venous 2.4 (HH) 0.5 - 1.9 mmol/L   Comment NOTIFIED PHYSICIAN   Lactic acid, plasma     Status: Abnormal   Collection Time: 07/04/24  7:50 PM  Result Value Ref Range   Lactic Acid, Venous 3.2 (HH) 0.5 - 1.9 mmol/L     Assessment and Plan:   Sepsis from possible aspiration pneumonia/ community acquired pneumonia vs UTI Admit to progressive care,  Trend lactate, hydrate gently . Empirically treat with broad spectrum IV antibiotics  CTA chest discussed with family.  [place] oxygen  to keep sats greater than 90%.  SLP eval in am.  Cough medication.     Acute on chronic respiratory failure with hypoxia in the setting of ILD.  Pneumonia and mild pulm edema on CTA Failed outpatient treatment with Augmentin .  Echo showed preserved LVEF with grade 1 diastolic dysfunction.  Monitor.     Mild AKI Suspect from poor oral intake/ dehydration.  Hydrate and repeat renal parameters in am.   Recent exploration and removal of infected penile prosthesis Completed 2 weeks of ciprofloxacin .  S/p supra pubic catheter placement on 06/16/2024.   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   Lactic acidosis Trend lactate.    Advance Care Planning:  Code Status: Full Code  confirmed with  wife at bedside.   Consults: PCCM  Family Communication: discussed the plan with the patient's wife at bedside.   Severity of Illness: The appropriate patient status for this patient is INPATIENT. Inpatient status is judged to be reasonable and necessary in order to provide the required intensity of service to ensure the patient's safety. The patient's presenting symptoms, physical exam findings, and initial radiographic and laboratory data in the context of their chronic comorbidities is felt to place them at high risk for further clinical deterioration. Furthermore, it is not anticipated that the patient will be medically stable for discharge from the hospital within 2 midnights of admission.   * I certify that at the point of admission it is my clinical judgment that the patient will require inpatient hospital care spanning beyond 2 midnights from the point of admission due to high intensity of service, high risk for further deterioration and high frequency of surveillance required.*  Author: Yul Diana, MD 07/04/2024 6:47 PM  For on call review [email or web address].

## 2024-07-04 NOTE — Consult Note (Signed)
 NAME:  [name], MRN:  [id number], DOB:  05/31/1930, LOS: 0 ADMISSION DATE:  07/04/2024, CONSULTATION DATE:  07/04/2024 REFERRING MD:  Dr. Cottie - EDP, CHIEF COMPLAINT:  Sepsis    History of Present Illness:  [name] is a 89 year old male with a past medical history significant for ILD HTN HLD, HFpEF, paroxysmal atrial fibrillation anticoagulated with Eliquis , prior PE/DVT, AAA s/p graft 2018 BPH with LUTS resulting in urinary retention and need for suprapubic catheter, anxiety and depression who presented to the ED at direction of pulmonary clinic for workup of possible sepsis secondary to pneumonia.  Family reports low-grade temperature of 100.3, chills, cough increased supplemental oxygen  demand. Of note patient was recently discharged 7/30 for admission of penile implant infection treated with 14 days of Cipro  and recent Augment to treat CAP. On ED arrival patient was hemodynamically stable.  Lab work significant for NA 134 creatinine 1.37 BUN 2.8, lactic 2.0, BNP 123.4, WBC 8.91.1 platelet 427.  Chest x-ray with slightly increased bilateral pulmonary edema chronic UIP findings. Given ILD history of concern for underlying PNA PCCM consulted for assistance in management   Pertinent  Medical History   ILD HTN HLD, HFpEF, paroxysmal atrial fibrillation anticoagulated with Eliquis , prior PE/DVT, AAA s/p graft 2018 BPH with LUTS resulting in urinary retention and need for suprapubic catheter, anxiety and depression   Significant Hospital Events: Including procedures, antibiotic start and stop dates in addition to other pertinent events   8/7 presented with cough, fever, fatigue, and increased oxygen  demans   Interim History / Subjective:  States he feels well and is ready to go home   Objective    Blood pressure (!) 112/58, pulse 87, temperature 99 F (37.2 C), resp. rate 15, height 6' (1.829 m), weight 80.7 kg, SpO2 100%.       No intake or output data in the 24 hours ending  07/04/24 1723 Filed Weights   07/04/24 1339  Weight: 80.7 kg    Examination: General: Acute on chronically ill appearing elderly male lying in bed, in NAD HEENT: [place]/AT, MM pink/moist, PERRL,  Neuro: Alert and oriented, non-focal  CV: s1s2 regular rate and rhythm, no murmur, rubs, or gallops,  PULM:  Slightly diminished bilaterally, no increased work of breathing, no added breath sounds on 4L [place]  GI: soft, bowel sounds active in all 4 quadrants, non-tender, non-distended Extremities: warm/dry, no edema  Skin: no rashes or lesions  Resolved problem list   Assessment and Plan  Community acquire pneumonia  -Failed outpatient course of Augmentin   Bilateral pulmonary edema with history of HF[EF -CTA chest with interval increase in interlobular septal thickening with slight enlargement of bilateral pleural effusions, compatible with developing pulmonary edema -ECHO 06/23/24 with EF 55-60%, now WMA, and normal RV function. Prior ECHO with grade 1 diastolic dysfunction  History of interstitial lung disease  P: Stable to be admitted per hospitalist  Broad spectrum antibiotics  Trend lactic  Optimize electrolytes  Goal of euvolemia   Once medication reconciliation complete continue home steroids, no need to increase dose at this time  Continue supplemental oxygen  as needed for sat goal greater then 92 Encourage pulmonary hygiene  Mobilize as able   Aspiration precautions   Best Practice (right click and Reselect all SmartList Selections daily)  Per primary  Labs   CBC: Recent Labs  Lab 07/04/24 1522  WBC 20.9*  NEUTROABS 18.7*  HGB 11.1*  HCT 34.7*  MCV 105.2*  PLT 427*    Basic  Metabolic Panel: Recent Labs  Lab 07/04/24 1522  NA 134*  K 4.7  CL 97*  CO2 26  GLUCOSE 131*  BUN 42*  CREATININE 1.37*  CALCIUM  9.2   GFR: Estimated Creatinine Clearance: 38.5 mL/min (A) (by C-G formula based on SCr of 1.37 mg/dL (H)). Recent Labs  Lab 07/04/24 1522 07/04/24 1536   WBC 20.9*  --   LATICACIDVEN  --  2.0*    Liver Function Tests: Recent Labs  Lab 07/04/24 1522  AST 22  ALT 30  ALKPHOS 65  BILITOT 0.6  PROT 6.1*  ALBUMIN  2.8*   No results for input(s): LIPASE, AMYLASE in the last 168 hours. No results for input(s): AMMONIA in the last 168 hours.  ABG    Component Value Date/Time   PHART 7.466 (H) 03/10/2023 1225   PCO2ART 29.5 (L) 03/10/2023 1225   PO2ART 60 (L) 03/10/2023 1225   HCO3 21.2 03/10/2023 1225   TCO2 22 03/10/2023 1225   ACIDBASEDEF 1.0 03/10/2023 1225   O2SAT 93 03/10/2023 1225     Coagulation Profile: Recent Labs  Lab 07/04/24 1522  INR 1.2    Cardiac Enzymes: No results for input(s): CKTOTAL, CKMB, CKMBINDEX, TROPONINI in the last 168 hours.  HbA1C: Hgb A1c MFr Bld  Date/Time Value Ref Range Status  03/07/2023 02:39 AM 6.3 (H) 4.8 - 5.6 % Final    Comment:    (NOTE) Pre diabetes:          5.7%-6.4%  Diabetes:              >6.4%  Glycemic control for   <7.0% adults with diabetes     CBG: No results for input(s): GLUCAP in the last 168 hours.  Review of Systems:   Please see the history of present illness. All other systems reviewed and are negative    Past Medical History:  He,  has a past medical history of AAA (abdominal aortic aneurysm) (HCC), Abnormal EKG, Allergic rhinitis, Allergy, Benign neoplasm of colon (04/14/2010), Carpal tunnel syndrome, Cataract, Decubitus ulcer of coccygeal region, stage 2 (HCC) (07/06/2023), Degenerative disc disease, Disturbances metabolism of methionine, homocystine, and cystathionine (HCC), Elevated prostate specific antigen (PSA), Elevated PSA (07/06/2023), Hearing loss, Hyperlipidemia, ILD (interstitial lung disease) (HCC) (07/06/2023), Impotence of organic origin, Internal hemorrhoids, Leukocytosis (07/06/2023), Neck pain, Otosclerosis of both ears, Peripheral vascular disease (HCC), Plantar fasciitis, Polymyalgia rheumatica (HCC), Polymyalgia  rheumatica (HCC) (07/06/2023), Rotator cuff syndrome of left shoulder, Scoliosis, and Shoulder pain.   Surgical History:   Past Surgical History:  Procedure Laterality Date   ABDOMINAL AORTIC ENDOVASCULAR STENT GRAFT N/A 08/16/2017   Procedure: ABDOMINAL AORTIC ENDOVASCULAR STENT GRAFT insertion;  Surgeon: Serene Gaile ORN, MD;  [facility];  Service: Vascular;  Laterality: N/A;   Actinic keratosis removal  06/21/2010   Left shoulder; Dr. Jadine   COLONOSCOPY     COLONOSCOPY W/ POLYPECTOMY     CYSTOSCOPY N/A 06/23/2024   Procedure: PHYLLIS SIDE;  Surgeon: [name], MD;  Location: [facility] OR[facility];  Service: Urology;  Laterality: N/A;   DUPUYTREN CONTRACTURE RELEASE Right 12/05/2013   Procedure: DUPUYTREN CONTRACTURE RELEASE RIGHT LONG, RING AND SMALL FINGERS;  Surgeon: [name]., MD;  Location: [facility];  Service: Orthopedics;  Laterality: Right;   Implant penile pump  2001   IR ANGIOGRAM PELVIS SELECTIVE OR SUPRASELECTIVE  12/11/2020   IR ANGIOGRAM SELECTIVE EACH ADDITIONAL VESSEL  12/11/2020   IR CYSTOSTOMY TUBE PLACEMENT/BLADDER ASPIRATION  06/11/2024   IR  EMBO ARTERIAL NOT HEMORR HEMANG INC GUIDE ROADMAPPING  12/11/2020   IR RADIOLOGIST EVAL & MGMT  07/30/2020   IR RADIOLOGIST EVAL & MGMT  08/16/2023   IR US  GUIDE VASC ACCESS RIGHT  12/11/2020   REMOVAL OF PENILE PROSTHESIS N/A 06/23/2024   Procedure: REMOVAL, PENILE PROSTHESIS;  Surgeon: [name], MD;  Location: [facility];  Service: Urology;  Laterality: N/A;   Resection of appendix and tip of rectum  February 2005   SCROTAL EXPLORATION N/A 06/23/2024   Procedure: EXPLORATION, SCROTUM;  Surgeon: [name], MD;  Location: [facility];  Service: Urology;  Laterality: N/A;  REMOVAL INFECTED PENILE PROSTHESIS; POSSIBLE CYSTOSCOPY   STAPEDECTOMY Bilateral 1985, 1988   [employer]     Social History:   reports that he has never smoked. He has never used smokeless tobacco. He reports that he  does not drink alcohol  and does not use drugs.   Family History:  His family history includes Emphysema in his mother; Heart disease in his mother; Leukemia in his brother. There is no history of Colon cancer, Esophageal cancer, Rectal cancer, or Stomach cancer.   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 Authorizing Provider  Acetaminophen  500 MG capsule Take 500-1,000 mg by mouth See admin instructions. Tylenol  Rapid Release 500 mg capsules- Take 500-1,000 mg by mouth every eight hours as needed for pain    [provider]  acyclovir  (ZOVIRAX ) 400 MG tablet Take 400 mg by mouth 2 (two) times daily. 08/12/23   [provider]  amoxicillin -clavulanate (AUGMENTIN ) 875-125 MG tablet Take 1 tablet by mouth 2 (two) times daily for 7 days. 07/02/24 07/09/24  Charley Conger, PA-C  apixaban  (ELIQUIS ) 2.5 MG TABS tablet Take 2.5 mg by mouth 2 (two) times daily. 05/09/23   [provider]  butalbital -acetaminophen -caffeine  (FIORICET ) 50-325-40 MG tablet Take 1 tablet by mouth daily as needed for headache. 03/23/23   Sebastian Toribio GAILS, MD  cholecalciferol (VITAMIN D3) 25 MCG (1000 UNIT) tablet Take 2,000 Units by mouth daily.    [provider]  ciprofloxacin  (CIPRO ) 500 MG tablet Take 1 tablet (500 mg total) by mouth 2 (two) times daily for 14 days. 06/26/24 07/10/24  Drusilla Sabas RAMAN, MD  diclofenac  Sodium (VOLTAREN ) 1 % GEL Apply 2 g  topically 4 (four) times daily as needed (shoulder pain). 03/23/23   Sebastian Toribio GAILS, MD  dicyclomine  (BENTYL ) 10 MG/5ML solution Take 20 mg by mouth 3 (three) times daily as needed (for cramping or abdominal pain).    [provider]  docusate sodium  (COLACE) 100 MG capsule Take 100 mg by mouth at bedtime.    [provider]  feeding supplement (ENSURE ENLIVE / ENSURE PLUS) LIQD Take 237 mLs by mouth 3 (three) times daily between meals. 03/23/23   Sebastian Toribio GAILS, MD  finasteride  (PROSCAR ) 5 MG tablet Take 5 mg  by mouth daily. 05/17/23   [provider]  fluorometholone  (FML) 0.1 % ophthalmic suspension Place 1 drop into the right eye at bedtime.     [provider]  folic acid  (FOLVITE ) 1 MG tablet Take 1 mg by mouth daily.    [provider]  lansoprazole (PREVACID) 30 MG capsule Take 30 mg by mouth 2 (two) times daily before a meal.    [provider]  lidocaine  (LIDODERM ) 5 % Place 1 patch onto the skin daily as needed (for pain- Remove & Discard patch within 12 hours or as directed by MD).    [provider]  LORazepam  (ATIVAN ) 0.5 MG tablet Take 0.5 mg by mouth See admin instructions. Take 0.5 mg by mouth at bedtime and an additional 0.5 mg up to twice a day as needed for anxiety 04/01/23   [provider]  melatonin 5 MG TABS Take 1 tablet (5 mg total) by mouth at bedtime. 03/08/24   Dorsey, John T IV, MD  methotrexate (RHEUMATREX) 2.5 MG tablet Take 12.5 mg by mouth every Friday. 02/15/24 08/13/24  [provider]  mirtazapine  (REMERON ) 15 MG tablet Take 30 mg by mouth at bedtime.    [provider]  Multiple Vitamins-Minerals (MULTIVITAMIN PO) Take 1 tablet by mouth daily.     [provider]  oxyCODONE  (OXY IR/ROXICODONE ) 5 MG immediate release tablet Take 1 tablet (5 mg total) by mouth every 4 (four) hours as needed for moderate pain (pain score 4-6). 06/26/24   Drusilla Sabas RAMAN, MD  polyethylene  glycol (MIRALAX  / GLYCOLAX ) 17 g packet Take 17 g by mouth in the morning and at bedtime.    [provider]  predniSONE  (DELTASONE ) 10 MG tablet Take 10 mg by mouth daily with breakfast. 02/15/24 08/13/24  [provider]  predniSONE  (DELTASONE ) 2.5 MG tablet Take 2.5 mg by mouth daily with breakfast.    [provider]  pregabalin  (LYRICA ) 75 MG capsule Take 75 mg by mouth at bedtime.    [provider]  senna (SENOKOT) 8.6 MG TABS tablet Take 1 tablet (8.6 mg total) by mouth daily as needed for mild constipation. 06/26/24   Drusilla Sabas RAMAN, MD  senna-docusate (SENNA-S) 8.6-50 MG tablet Take 1 tablet by mouth in the morning.    [provider]  tamsulosin  (FLOMAX ) 0.4 MG CAPS capsule Take 0.8 mg by mouth at bedtime. 06/10/20   [provider]  zolpidem  (AMBIEN ) 5 MG tablet Take 5 mg by mouth at bedtime.    [provider]     Critical care time: NA  Artin Mceuen D. Harris, NP-C [facility] Personal contact information can be found on Amion  If no contact or response made please call [phone] 07/04/2024, 5:46 PM

## 2024-07-04 NOTE — ED Provider Notes (Signed)
 [facility] Provider Note   CSN: [id number] Arrival date & time: 07/04/24  1330     Patient presents with: Chills and Shortness of Breath   [name] is a 89 y.o. male w/ hx of recent infected penile implant (removed last month, now with suprapubic foley), ILD, COPD (on 2L [place] at baseline), recurring PNA, AAA s/p repair, PE/DVT on eliquis , HTN, HLD, polymyalgia rheumatica, here with shortness of breath, fever, coughing.  His home nurse or health aide reports temp 100.86F today.  Pt needing 4L [place] (2 liter increase from baseline).  Pt currently on augmentin  for past 3 days per PCP for possible PNA, and he is also taking long-term ciprofloxacin  per urology for infected penile implant.    External records reviewed - discharge summary 7/30 after admission for penile infected implant removed in OR 7/27 by urology - narrowed to ciprofloxacin  at discharge x 14 days.  Also treated for CAP with augmentin  and azithromycin , duonebs PRN.   HPI     Prior to Admission medications   Medication Sig Start Date End Date Taking? Authorizing Provider  Acetaminophen  500 MG capsule Take 500-1,000 mg by mouth See admin instructions. Tylenol  Rapid Release 500 mg capsules- Take 500-1,000 mg by mouth every eight hours as needed for pain    [provider]  acyclovir  (ZOVIRAX ) 400 MG tablet Take 400 mg by mouth 2 (two) times daily. 08/12/23   [provider]  amoxicillin -clavulanate (AUGMENTIN ) 875-125 MG tablet Take 1 tablet by mouth 2 (two) times daily for 7 days. 07/02/24 07/09/24  Charley Conger, PA-C  apixaban  (ELIQUIS ) 2.5 MG TABS tablet Take 2.5 mg by mouth 2 (two) times daily. 05/09/23   [provider]  butalbital -acetaminophen -caffeine  (FIORICET ) 50-325-40 MG tablet Take 1 tablet by mouth daily as needed for headache. 03/23/23   Sebastian Toribio GAILS, MD  cholecalciferol (VITAMIN D3) 25 MCG (1000 UNIT) tablet Take 2,000 Units by mouth daily.     [provider]  ciprofloxacin  (CIPRO ) 500 MG tablet Take 1 tablet (500 mg total) by mouth 2 (two) times daily for 14 days. 06/26/24 07/10/24  Drusilla Sabas GORMAN, MD  diclofenac  Sodium (VOLTAREN ) 1 % GEL Apply 2 g topically 4 (four) times daily as needed (shoulder pain). 03/23/23   Sebastian Toribio GAILS, MD  dicyclomine  (BENTYL ) 10 MG/5ML solution Take 20 mg by mouth 3 (three) times daily as needed (for cramping or abdominal pain).    [provider]  docusate sodium  (COLACE) 100 MG capsule Take 100 mg by mouth at bedtime.    [provider]  feeding supplement (ENSURE ENLIVE / ENSURE PLUS) LIQD Take 237 mLs by mouth 3 (three) times daily between meals. 03/23/23   Sebastian Toribio GAILS, MD  finasteride  (PROSCAR ) 5 MG tablet Take 5 mg by mouth daily. 05/17/23   [provider]  fluorometholone  (FML) 0.1 % ophthalmic suspension Place 1 drop into the right eye at bedtime.     [provider]  folic acid  (FOLVITE ) 1 MG tablet Take 1 mg by mouth daily.    [provider]  lansoprazole (PREVACID) 30 MG capsule Take 30 mg by mouth 2 (two) times daily before a meal.    [provider]  lidocaine  (LIDODERM ) 5 % Place 1 patch onto the skin daily as needed (for pain- Remove & Discard patch within 12 hours or as directed by MD).    [provider]  LORazepam  (ATIVAN ) 0.5 MG tablet Take 0.5 mg  by mouth See admin instructions. Take 0.5 mg by mouth at bedtime and an additional 0.5 mg up to twice a day as needed for anxiety 04/01/23   [provider]  melatonin 5 MG TABS Take 1 tablet (5 mg total) by mouth at bedtime. 03/08/24   Dorsey, John T IV, MD  methotrexate (RHEUMATREX) 2.5 MG tablet Take 12.5 mg by mouth every Friday. 02/15/24 08/13/24  [provider]  mirtazapine  (REMERON ) 15 MG tablet Take 30 mg by mouth at bedtime.    [provider]  Multiple Vitamins-Minerals (MULTIVITAMIN PO) Take 1 tablet by mouth daily.     [provider]  oxyCODONE  (OXY IR/ROXICODONE ) 5 MG immediate release tablet Take 1 tablet (5 mg total) by mouth every 4 (four) hours as needed for moderate pain (pain score 4-6). 06/26/24   Drusilla Sabas RAMAN, MD  polyethylene glycol (MIRALAX  / GLYCOLAX ) 17 g packet Take 17 g by mouth in the morning and at bedtime.    [provider]  predniSONE  (DELTASONE ) 10 MG tablet Take 10 mg by mouth daily with breakfast. 02/15/24 08/13/24  [provider]  predniSONE  (DELTASONE ) 2.5 MG tablet Take 2.5 mg by mouth daily with breakfast.    [provider]  pregabalin  (LYRICA ) 75 MG capsule Take 75 mg by mouth at bedtime.    [provider]  senna (SENOKOT) 8.6 MG TABS tablet Take 1 tablet (8.6 mg total) by mouth daily as needed for mild constipation. 06/26/24   Drusilla Sabas RAMAN, MD  senna-docusate (SENNA-S) 8.6-50 MG tablet Take 1 tablet by mouth in the morning.    [provider]  tamsulosin  (FLOMAX ) 0.4 MG CAPS capsule Take 0.8 mg by mouth at bedtime. 06/10/20   [provider]  zolpidem  (AMBIEN ) 5 MG tablet Take 5 mg by mouth at bedtime.    [provider]    Allergies: Daptomycin , Denosumab , Rosuvastatin , Doxycycline , Morphine  and codeine, and Valium [diazepam]    Review of Systems  Updated Vital Signs BP (!) 112/58   Pulse 87   Temp 99 F (37.2 C)   Resp 15   Ht 6' (1.829 m)   Wt 80.7 kg   SpO2 100%   BMI 24.14 kg/m   Physical Exam Constitutional:      General: He is not in acute distress. HENT:     Head: Normocephalic and atraumatic.  Eyes:     Conjunctiva/sclera: Conjunctivae normal.     Pupils: Pupils are equal, round, and reactive to light.  Cardiovascular:     Rate and Rhythm: Tachycardia present. Rhythm irregular.  Pulmonary:     Effort: Pulmonary effort is normal. No respiratory distress.     Comments: 96% O2 sat on 4L [place], Speaking in full sentences Rhoncherous breath sounds bilaterally, without audible wheezing Abdominal:      General: There is no distension.     Tenderness: There is no abdominal tenderness.  Skin:    General: Skin is warm and dry.  Neurological:     General: No focal deficit present.     Mental Status: He is alert. Mental status is at baseline.  Psychiatric:        Mood and Affect: Mood normal.        Behavior: Behavior normal.     (all labs ordered are listed, but only abnormal results are displayed) Labs Reviewed  COMPREHENSIVE METABOLIC PANEL WITH GFR - Abnormal; Notable for the following components:      Result Value   Sodium  134 (*)    Chloride 97 (*)    Glucose, Bld 131 (*)    BUN 42 (*)    Creatinine, Ser 1.37 (*)    Total Protein 6.1 (*)    Albumin  2.8 (*)    GFR, Estimated 49 (*)    All other components within normal limits  CBC WITH DIFFERENTIAL/PLATELET - Abnormal; Notable for the following components:   WBC 20.9 (*)    RBC 3.30 (*)    Hemoglobin 11.1 (*)    HCT 34.7 (*)    MCV 105.2 (*)    RDW 18.6 (*)    Platelets 427 (*)    Neutro Abs 18.7 (*)    Lymphs Abs 0.6 (*)    Abs Immature Granulocytes 0.36 (*)    All other components within normal limits  PROTIME-INR - Abnormal; Notable for the following components:   Prothrombin Time 15.8 (*)    All other components within normal limits  URINALYSIS, W/ REFLEX TO CULTURE (INFECTION SUSPECTED) - Abnormal; Notable for the following components:   Color, Urine AMBER (*)    APPearance CLOUDY (*)    Hgb urine dipstick MODERATE (*)    Protein, ur 30 (*)    Leukocytes,Ua LARGE (*)    Bacteria, UA RARE (*)    All other components within normal limits  BRAIN NATRIURETIC PEPTIDE - Abnormal; Notable for the following components:   B Natriuretic Peptide 123.4 (*)    All other components within normal limits  I-STAT CG4 LACTIC ACID, ED - Abnormal; Notable for the following components:   Lactic Acid, Venous 2.0 (*)    All other components within normal limits  RESP PANEL BY RT-PCR (RSV, FLU A&B, COVID)  RVPGX2  CULTURE,  BLOOD (ROUTINE X 2)  CULTURE, BLOOD (ROUTINE X 2)  URINE CULTURE  URINE CULTURE  I-STAT CG4 LACTIC ACID, ED    EKG: EKG Interpretation Date/Time:  Thursday July 04 2024 15:29:26 EDT Ventricular Rate:  94 PR Interval:  186 QRS Duration:  99 QT Interval:  326 QTC Calculation: 408 R Axis:   -38  Text Interpretation: Sinus rhythm Left axis deviation RSR' in V1 or V2, probably normal variant Confirmed by Cottie Cough [phone]) on 07/04/2024 3:31:01 PM  Radiology: CT Angio Chest PE W and/or Wo Contrast Result Date: 07/04/2024 CLINICAL DATA:  Pulmonary embolism (PE) suspected, high prob hx of ILD, recurring PNA, and PE - here with new worsening hypoxia EXAM: CT ANGIOGRAPHY CHEST WITH CONTRAST TECHNIQUE: Multidetector CT imaging of the chest was performed using the standard protocol during bolus administration of intravenous contrast. Multiplanar CT image reconstructions and MIPs were obtained to evaluate the vascular anatomy. RADIATION DOSE REDUCTION: This exam was performed according to the departmental dose-optimization program which includes automated exposure control, adjustment of the mA and/or kV according to patient size and/or use of iterative reconstruction technique. CONTRAST:  75mL OMNIPAQUE  IOHEXOL  350 MG/ML SOLN COMPARISON:  07/04/2024, 06/22/2024 FINDINGS: Cardiovascular: This is a technically adequate evaluation of the pulmonary vasculature. No filling defects or pulmonary emboli. 4.6 cm ascending thoracic aortic aneurysm is again noted. Persistent aneurysmal dilation of the aortic arch, measuring 4 cm in the proximal aspect, 4.4 cm within the mid arch, and 4.8 cm within the distal aortic arch. Assessment of the vascular lumen is slightly limited due to timing of contrast bolus, but there is no evidence of dissection. Diffuse atherosclerosis of the aorta and coronary vasculature is noted. Prominent calcifications of the aortic valve are unchanged. No pericardial effusion.  Mediastinum/Nodes: No enlarged mediastinal, hilar, or axillary lymph nodes. Thyroid  gland, trachea, and esophagus demonstrate no significant findings. Lungs/Pleura: There are small bilateral pleural effusions, slightly increased since prior CT. Dependent areas of consolidation within the lungs compatible with atelectasis. High attenuation material dependent within the lower lobes likely reflects sequela of prior aspiration, stable. The basilar predominant subpleural scarring and fibrosis seen on prior study is again noted, compatible with UIP. However, there is increased interlobular septal thickening noted on this exam, which may reflect an element of interstitial edema. No dense consolidation or pneumothorax. The central airways are patent. Upper Abdomen: No acute abnormality. Musculoskeletal: No acute or destructive bony abnormalities. Reconstructed images demonstrate no additional findings. Review of the MIP images confirms the above findings. IMPRESSION: 1. No evidence of pulmonary embolus. 2. Interval increase in interlobular septal thickening with slight enlargement of bilateral pleural effusions, compatible with developing pulmonary edema superimposed upon findings of chronic UIP. 3. Thoracic aortic aneurysm as above, involving the ascending aorta and aortic arch. Maximal diameter 4.8 cm within the distal arch, with no evidence of dissection. Recommend semi-annual imaging followup by CTA or MRA and referral to cardiothoracic surgery if not already obtained. This recommendation follows 2010 ACCF/AHA/AATS/ACR/ASA/SCA/SCAI/SIR/STS/SVM Guidelines for the Diagnosis and Management of Patients With Thoracic Aortic Disease. Circulation. 2010; 121: Z733-z63. Aortic aneurysm NOS (ICD10-I71.9) Ascending thoracic aortic aneurysm. 4. Aortic Atherosclerosis (ICD10-I70.0). Coronary artery atherosclerosis. Electronically Signed   By: Ozell Daring M.D.   On: 07/04/2024 16:42   DG Chest Port 1 View if patient is in a  treatment room. Result Date: 07/04/2024 CLINICAL DATA:  Suspected sepsis. EXAM: PORTABLE CHEST 1 VIEW COMPARISON:  Chest CT dated 06/21/2024. FINDINGS: Shallow inspiration. Faint interstitial densities may represent atypical infiltrate. No pleural effusion pneumothorax. The cardiac silhouette is within normal limits. No acute osseous pathology. IMPRESSION: Faint interstitial densities may represent atypical infiltrate. Electronically Signed   By: Vanetta Chou M.D.   On: 07/04/2024 14:47     .Critical Care  Performed by: Cottie Donnice PARAS, MD Authorized by: Cottie Donnice PARAS, MD   Critical care provider statement:    Critical care time (minutes):  30   Critical care time was exclusive of:  Separately billable procedures and treating other patients   Critical care was necessary to treat or prevent imminent or life-threatening deterioration of the following conditions:  Sepsis   Critical care was time spent personally by me on the following activities:  Ordering and performing treatments and interventions, ordering and review of laboratory studies, ordering and review of radiographic studies, pulse oximetry, review of old charts, examination of patient and evaluation of patient's response to treatment    Medications Ordered in the ED  lactated ringers  infusion (0 mLs Intravenous Paused 07/04/24 1710)  lactated ringers  bolus 1,000 mL (1,000 mLs Intravenous New Bag/Given 07/04/24 1524)    And  lactated ringers  bolus 1,000 mL (1,000 mLs Intravenous New Bag/Given 07/04/24 1527)    And  lactated ringers  bolus 500 mL (500 mLs Intravenous New Bag/Given 07/04/24 1708)  vancomycin  (VANCOREADY) IVPB 1500 mg/300 mL (1,500 mg Intravenous New Bag/Given 07/04/24 1707)  oxyCODONE  (Oxy IR/ROXICODONE ) immediate release tablet 5 mg (5 mg Oral Given 07/04/24 1552)  ceFEPIme  (MAXIPIME ) 2 g in sodium chloride  0.9 % 100 mL IVPB (0 g Intravenous Stopped 07/04/24 1555)  metroNIDAZOLE  (FLAGYL ) IVPB 500 mg (0 mg Intravenous  Stopped 07/04/24 1655)  LORazepam  (ATIVAN ) tablet 1 mg (1 mg Oral Given 07/04/24 1552)  iohexol  (OMNIPAQUE ) 350 MG/ML injection 75 mL (75  mLs Intravenous Contrast Given 07/04/24 1634)    Clinical Course as of 07/04/24 1714  Thu Jul 04, 2024  1650 Unclear from CT imaging how much of his symptoms may be chronic and ongoing with his IUD - will discuss with pulm team [MT]  1656 Dr Jackolyn pulm recommending admission for anitbiotics and sepsis rule out - pulm team to consult [MT]    Clinical Course User Index [MT] Levia Waltermire, Donnice PARAS, MD                                 Medical Decision Making Amount and/or Complexity of Data Reviewed Labs: ordered. Radiology: ordered.  Risk Prescription drug management. Decision regarding hospitalization.   This patient presents to the ED with concern for fever, fatigue, hypoxia. This involves an extensive number of treatment options, and is a complaint that carries with it a high risk of complications and morbidity.  The differential diagnosis includes PNA vs pleural effusion vs PE vs PTX vs other infection vs other  Co-morbidities that complicate the patient evaluation: hx of PE, thrombus risk; ILD; foley catheter infection risk  Additional history obtained from family and caretakers at bedside  External records from outside source obtained and reviewed including recent hospital course and discharge summary  I ordered and personally interpreted labs.  The pertinent results include:  WBC elevated, lactate 2.0, Cr mildly elevated. UA + leuks (suprapubic cath).  I ordered imaging studies including dg chest, Ct pe I independently visualized and interpreted imaging which showed pulm effusion, possible infiltrate (?), ILD disease, no acute PE  I agree with the radiologist interpretation  The patient was maintained on a cardiac monitor.  I personally viewed and interpreted the cardiac monitored which showed an underlying rhythm of: mild tachycardia  Per my  interpretation the patient's ECG shows no acute ischemia  I ordered medication including BS antibiotics, IV fluids per sepsis protocol  I have reviewed the patients home medicines and have made adjustments as needed  Test Considered: doubt intraabdominal abscess or emergency - no indication for abdominal CT scan  I requested consultation with the pulm,  and discussed lab and imaging findings as well as pertinent plan - they recommend: see ed course  After the interventions noted above, I reevaluated the patient and found that they have: improved   Dispostion:  After consideration of the diagnostic results and the patients response to treatment, I feel that the patent would benefit from admission      Final diagnoses:  Sepsis, due to unspecified organism, unspecified whether acute organ dysfunction present [facility])    ED Discharge Orders     None          Cottie Donnice PARAS, MD 07/04/24 1715

## 2024-07-04 NOTE — ED Notes (Signed)
 Pt taken to CT.

## 2024-07-04 NOTE — ED Notes (Signed)
 Pt becoming increasingly agitated. Trying to crawl out of bed. Briefly redirectable.

## 2024-07-04 NOTE — Telephone Encounter (Signed)
 Copied from CRM [id number]. Topic: Clinical - Red Word Triage >> Jul 04, 2024 11:59 AM Isabell A wrote: [facility] that prompted transfer to Nurse Triage: Spouse states they can't get patients oxygen  to maintain, states it was last in the 84's - she would like to know where she can get a mask from. Reason for Disposition  [1] Monitoring oxygen  level (e.g., pulse oximetry) AND [2] fall in oxygen  level 4% or more (below known patient baseline, while awake and resting)  Answer Assessment - Initial Assessment Questions E2C2 Pulmonary Triage - Initial Assessment Questions Chief Complaint (e.g., cough, sob, wheezing, fever, chills, sweat or additional symptoms) *Go to specific symptom protocol after initial questions. Reports has PNA - currently on abx and prednisone   How long have symptoms been present? This AM  Have you tested for COVID or Flu? Note: If not, ask patient if a home test can be taken. If so, instruct patient to call back for positive results. No  MEDICINES:   Have you used any OTC meds to help with symptoms?  If yes, ask What medications? *No Answer*  Have you used your inhalers/maintenance medication?  If yes, What medications? Does not take any INH  If inhaler, ask How many puffs and how often? Note: Review instructions on medication in the chart. Albuterol  NEB - last given 1 hour ago without relief  OXYGEN : Do you wear supplemental oxygen ? Yes If yes, How many liters are you supposed to use? Currently on 4L but supposed to be on 2L  Do you monitor your oxygen  levels? Yes If yes, What is your reading (oxygen  level) today? 87 on O2  What is your usual oxygen  saturation reading?  (Note: Pulmonary O2 sats should be 90% or greater) Mid 90s on 1 L        1. MAIN CONCERN OR SYMPTOM: What's your main concern? (e.g., low oxygen  level, breathing difficulty) What question do you have?     *No Answer* 2.  OXYGEN  EQUIPMENT:  Are you having  trouble with your oxygen  equipment?  (e.g., cannula, mask, tubing, tank, concentrator)     *No Answer* 3. ONSET: When did the  *No Answer*  start?      *No Answer* 4. OXYGEN  THERAPY:      *No Answer* 5. PULSE OXIMETER:      *No Answer* 6. O2 MONITORING: What is the oxygen  level (pulse ox reading)? (e.g., 70-100%)     *No Answer* 7. VITAL SIGNS MONITORING: Do you monitor/measure your oxygen  level or vital signs (e.g., yes, no, measurements are automatically sent to provider/call center). Document CURRENT and NORMAL BASELINE values if available.       *No Answer* 8. BREATHING DIFFICULTY: Are you having any difficulty breathing? If Yes, ask: How bad is it?  (e.g., none, mild, moderate, severe)      *No Answer* 9. OTHER SYMPTOMS: Do you have any other symptoms? (e.g., fever, change in sputum)     *No Answer* 10. SMOKING: Do you smoke currently? Is there anyone that smokes around you?  (Note: smoking around oxygen  is dangerous!)       *No Answer*    Triager strongly advised that pt needed to be evaluated in ED d/t increased O2 demands and no improvement with NEB. Triage was limited d/t pt caregiver requesting to see if O2 mask can be supplied in office. Triager called [facility] and confirmed that an O2 mask needed to be ordered via DME. Triager was disconnected prematurely before able to notify  pt caregiver.  Protocols used: COPD Oxygen  Monitoring and Hypoxia-A-AH

## 2024-07-04 NOTE — Progress Notes (Signed)
 Pharmacy Antibiotic Note  [name] is a 89 y.o. male admitted on 07/04/2024 with pneumonia.  Pharmacy has been consulted for vanc dosing.  Pt with multiple medical hx who was admitted for possible PNA. Vanc/cefepime  ordered. D/w Dr Cherlyn, we will change vanc to linezolid  d/t AKI.   Scr 1.34  Plan: Dc vanc Linezolid  600mg  IV BID starting 8/8 Cefepime  2g IV q12  Height: 6' (182.9 cm) Weight: 80.7 kg (178 lb) IBW/kg (Calculated) : 77.6  Temp (24hrs), Avg:98.3 F (36.8 C), Min:97.8 F (36.6 C), Max:99 F (37.2 C)  Recent Labs  Lab 07/04/24 1522 07/04/24 1536 07/04/24 1751  WBC 20.9*  --   --   CREATININE 1.37*  --   --   LATICACIDVEN  --  2.0* 2.4*    Estimated Creatinine Clearance: 38.5 mL/min (A) (by C-G formula based on SCr of 1.37 mg/dL (H)).    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   Denosumab  Other (See Comments)    PROLIA  - Bad reaction 12/2023- will not receive this again    Rosuvastatin  Other (See Comments)    Stopped taking due to feeling achy     Doxycycline  Other (See Comments)    Upset stomach, currently taking and tolerating well (??)   Morphine  And Codeine Nausea And Vomiting   Valium [Diazepam] Other (See Comments)    Caused TOO MUCH sedation    Antimicrobials this admission: 08/07 VAN x1 8/7 linezolid >> 08/07 cefepime >> 08/07 MTZ>>  Dose adjustments this admission:   Microbiology results: 8/7 resp panel>> 8/7 blood>>  Sergio Batch, PharmD, [place], AAHIVP, CPP Infectious Disease Pharmacist 07/04/2024 6:55 PM

## 2024-07-04 NOTE — ED Notes (Signed)
 Received report from dylan, EMT. Pt sitting on the bedside. Confused, asking to leave. Able to co erce back to sitting in the bed. Pt on the monitor, family at bedside.

## 2024-07-04 NOTE — ED Notes (Signed)
 Pt more calm. Continues to want to go home but not trying to get out of bed. Less agitated with staff.

## 2024-07-04 NOTE — ED Triage Notes (Signed)
 C/O CP and SHOB/ tremors/ low O2. Recent pneumonia. Pt on 2L of O2. Pt currently on abx.

## 2024-07-04 NOTE — Sepsis Progress Note (Signed)
 Elink monitoring for the code sepsis protocol.

## 2024-07-04 NOTE — ED Notes (Signed)
 Pulmonology provider at bedside discussing plan of care.

## 2024-07-04 NOTE — Progress Notes (Signed)
 ED Pharmacy Antibiotic Sign Off An antibiotic consult was received from an ED provider for vancomycin  per pharmacy dosing for sepsis. A chart review was completed to assess appropriateness.  A single dose of cefepime  2 g and metronidazole  500 mg was placed by the ED provider.   The following one time order(s) were placed per pharmacy consult:  vancomycin  1500 mg x 1 dose  Further antibiotic and/or antibiotic pharmacy consults should be ordered by the admitting provider if indicated.   Thank you for allowing pharmacy to be a part of this patient's care.   R. Samual Satterfield, PharmD PGY-1 Acute Care Pharmacy Resident [facility] 07/04/2024 3:08 PM

## 2024-07-04 NOTE — Sepsis Progress Note (Signed)
 Notified provider of need to order lactic acid #3 at 1930.

## 2024-07-04 NOTE — ED Notes (Signed)
 Hospitalists at bedside chatting with patient.

## 2024-07-04 NOTE — Progress Notes (Incomplete)
 Patient came up from the ER at 1845, he is on 2 LO2, [place]. He is confused but redirectable. Patient has a suprapubic catheter.

## 2024-07-05 ENCOUNTER — Inpatient Hospital Stay (HOSPITAL_COMMUNITY)

## 2024-07-05 DIAGNOSIS — E8729 Other acidosis: Secondary | ICD-10-CM

## 2024-07-05 DIAGNOSIS — J9621 Acute and chronic respiratory failure with hypoxia: Secondary | ICD-10-CM

## 2024-07-05 DIAGNOSIS — A419 Sepsis, unspecified organism: Secondary | ICD-10-CM | POA: Diagnosis not present

## 2024-07-05 DIAGNOSIS — R6521 Severe sepsis with septic shock: Secondary | ICD-10-CM | POA: Diagnosis not present

## 2024-07-05 DIAGNOSIS — J90 Pleural effusion, not elsewhere classified: Secondary | ICD-10-CM

## 2024-07-05 LAB — CBC
HCT: 34.3 % — ABNORMAL LOW (ref 39.0–52.0)
Hemoglobin: 11.5 g/dL — ABNORMAL LOW (ref 13.0–17.0)
MCH: 33.9 pg (ref 26.0–34.0)
MCHC: 33.5 g/dL (ref 30.0–36.0)
MCV: 101.2 fL — ABNORMAL HIGH (ref 80.0–100.0)
Platelets: 322 K/uL (ref 150–400)
RBC: 3.39 MIL/uL — ABNORMAL LOW (ref 4.22–5.81)
RDW: 19.1 % — ABNORMAL HIGH (ref 11.5–15.5)
WBC: 27.9 K/uL — ABNORMAL HIGH (ref 4.0–10.5)
nRBC: 0 % (ref 0.0–0.2)

## 2024-07-05 LAB — COMPREHENSIVE METABOLIC PANEL WITH GFR
ALT: 45 U/L — ABNORMAL HIGH (ref 0–44)
AST: 48 U/L — ABNORMAL HIGH (ref 15–41)
Albumin: 2.3 g/dL — ABNORMAL LOW (ref 3.5–5.0)
Alkaline Phosphatase: 73 U/L (ref 38–126)
Anion gap: 16 — ABNORMAL HIGH (ref 5–15)
BUN: 47 mg/dL — ABNORMAL HIGH (ref 8–23)
CO2: 19 mmol/L — ABNORMAL LOW (ref 22–32)
Calcium: 8.8 mg/dL — ABNORMAL LOW (ref 8.9–10.3)
Chloride: 103 mmol/L (ref 98–111)
Creatinine, Ser: 2.28 mg/dL — ABNORMAL HIGH (ref 0.61–1.24)
GFR, Estimated: 26 mL/min — ABNORMAL LOW (ref >60)
Glucose, Bld: 91 mg/dL (ref 70–99)
Potassium: 4.8 mmol/L (ref 3.5–5.1)
Sodium: 138 mmol/L (ref 135–145)
Total Bilirubin: 1.1 mg/dL (ref 0.0–1.2)
Total Protein: 5.2 g/dL — ABNORMAL LOW (ref 6.5–8.1)

## 2024-07-05 LAB — PROCALCITONIN: Procalcitonin: 59.37 ng/mL

## 2024-07-05 LAB — PHOSPHORUS: Phosphorus: 3 mg/dL (ref 2.5–4.6)

## 2024-07-05 LAB — URINE CULTURE: Culture: 60000 — AB

## 2024-07-05 LAB — SEDIMENTATION RATE: Sed Rate: 34 mm/h — ABNORMAL HIGH (ref 0–16)

## 2024-07-05 LAB — LACTIC ACID, PLASMA
Lactic Acid, Venous: 5.2 mmol/L (ref 0.5–1.9)
Lactic Acid, Venous: 5.5 mmol/L (ref 0.5–1.9)

## 2024-07-05 LAB — BRAIN NATRIURETIC PEPTIDE: B Natriuretic Peptide: 236.5 pg/mL — ABNORMAL HIGH (ref 0.0–100.0)

## 2024-07-05 LAB — GLUCOSE, CAPILLARY: Glucose-Capillary: 65 mg/dL — ABNORMAL LOW (ref 70–99)

## 2024-07-05 LAB — MAGNESIUM: Magnesium: 1.4 mg/dL — ABNORMAL LOW (ref 1.7–2.4)

## 2024-07-05 MED ORDER — NOREPINEPHRINE 4 MG/250ML-% IV SOLN
0.0000 ug/min | INTRAVENOUS | Status: DC
  Administered 2024-07-05: 10 ug/min via INTRAVENOUS
  Administered 2024-07-05: 2 ug/min via INTRAVENOUS
  Filled 2024-07-05 (×2): qty 250

## 2024-07-05 MED ORDER — SODIUM CHLORIDE 0.9 % IV SOLN
2.0000 g | Freq: Two times a day (BID) | INTRAVENOUS | Status: DC
  Administered 2024-07-05: 2 g via INTRAVENOUS
  Filled 2024-07-05: qty 12.5

## 2024-07-05 MED ORDER — ACETAMINOPHEN 650 MG RE SUPP
RECTAL | Status: AC
  Administered 2024-07-05: 650 mg via RECTAL
  Filled 2024-07-05: qty 1

## 2024-07-05 MED ORDER — MORPHINE BOLUS VIA INFUSION
5.0000 mg | INTRAVENOUS | Status: DC | PRN
  Administered 2024-07-05: 5 mg via INTRAVENOUS

## 2024-07-05 MED ORDER — GLYCOPYRROLATE 0.2 MG/ML IJ SOLN
0.2000 mg | INTRAMUSCULAR | Status: DC | PRN
Start: 2024-07-05 — End: 2024-07-06
  Administered 2024-07-05 – 2024-07-06 (×3): 0.2 mg via INTRAVENOUS
  Filled 2024-07-05 (×3): qty 1

## 2024-07-05 MED ORDER — DEXTROSE 50 % IV SOLN
1.0000 | Freq: Once | INTRAVENOUS | Status: AC
  Administered 2024-07-05: 50 mL via INTRAVENOUS
  Filled 2024-07-05: qty 50

## 2024-07-05 MED ORDER — ONDANSETRON 4 MG PO TBDP: 4.0000 mg | ORAL_TABLET | Freq: Four times a day (QID) | ORAL | Status: DC | PRN

## 2024-07-05 MED ORDER — HYDROCORTISONE SOD SUC (PF) 100 MG IJ SOLR
100.0000 mg | Freq: Two times a day (BID) | INTRAMUSCULAR | Status: DC
  Administered 2024-07-05: 100 mg via INTRAVENOUS
  Filled 2024-07-05: qty 2

## 2024-07-05 MED ORDER — GLYCOPYRROLATE 0.2 MG/ML IJ SOLN
0.2000 mg | INTRAMUSCULAR | Status: DC | PRN
Start: 2024-07-05 — End: 2024-07-06

## 2024-07-05 MED ORDER — DIPHENHYDRAMINE HCL 50 MG/ML IJ SOLN: 25.0000 mg | INTRAMUSCULAR | Status: DC | PRN

## 2024-07-05 MED ORDER — SENNOSIDES-DOCUSATE SODIUM 8.6-50 MG PO TABS: 1.0000 | ORAL_TABLET | Freq: Every evening | ORAL | Status: DC | PRN

## 2024-07-05 MED ORDER — ACETAMINOPHEN 650 MG RE SUPP
650.0000 mg | Freq: Four times a day (QID) | RECTAL | Status: DC | PRN
  Administered 2024-07-06: 650 mg via RECTAL
  Filled 2024-07-05: qty 1

## 2024-07-05 MED ORDER — MAGNESIUM SULFATE 2 GM/50ML IV SOLN
2.0000 g | Freq: Once | INTRAVENOUS | Status: AC
  Administered 2024-07-05: 2 g via INTRAVENOUS
  Filled 2024-07-05: qty 50

## 2024-07-05 MED ORDER — SODIUM CHLORIDE 0.9 % IV SOLN: INTRAVENOUS | Status: DC

## 2024-07-05 MED ORDER — ONDANSETRON HCL 4 MG/2ML IJ SOLN
4.0000 mg | Freq: Four times a day (QID) | INTRAMUSCULAR | Status: DC | PRN
  Administered 2024-07-05: 4 mg via INTRAVENOUS
  Filled 2024-07-05: qty 2

## 2024-07-05 MED ORDER — LACTATED RINGERS IV BOLUS
1000.0000 mL | Freq: Once | INTRAVENOUS | Status: AC
  Administered 2024-07-05: 1000 mL via INTRAVENOUS

## 2024-07-05 MED ORDER — ACETAMINOPHEN 650 MG RE SUPP: 650.0000 mg | Freq: Once | RECTAL | Status: AC

## 2024-07-05 MED ORDER — GLYCOPYRROLATE 1 MG PO TABS: 1.0000 mg | ORAL_TABLET | ORAL | Status: DC | PRN

## 2024-07-05 MED ORDER — MIDAZOLAM HCL 2 MG/2ML IJ SOLN
2.0000 mg | INTRAMUSCULAR | Status: DC | PRN
  Administered 2024-07-05: 2 mg via INTRAVENOUS
  Filled 2024-07-05: qty 4

## 2024-07-05 MED ORDER — POLYVINYL ALCOHOL 1.4 % OP SOLN
1.0000 [drp] | Freq: Four times a day (QID) | OPHTHALMIC | Status: DC | PRN
  Filled 2024-07-05: qty 15

## 2024-07-05 MED ORDER — ACETAMINOPHEN 325 MG PO TABS: 650.0000 mg | ORAL_TABLET | Freq: Four times a day (QID) | ORAL | Status: DC | PRN

## 2024-07-05 MED ORDER — MORPHINE 100MG IN NS 100ML (1MG/ML) PREMIX INFUSION
0.0000 mg/h | INTRAVENOUS | Status: DC
  Administered 2024-07-05: 5 mg/h via INTRAVENOUS
  Administered 2024-07-06: 2 mg/h via INTRAVENOUS
  Administered 2024-07-06: 5 mg/h via INTRAVENOUS
  Filled 2024-07-05 (×3): qty 100

## 2024-07-05 NOTE — Significant Event (Signed)
 Rapid Response Event Note   Reason for Call :  hypotension  Initial Focused Assessment:  Patient laying in the bed, skin warm to touch and flushed, minimally response to pain, moans with sternal rub, not following commands, lungs diminished, with weak congested cough. Blood pressure continued to drop SBP in 70s. Dr. Franky to bedside, CCM consulted. Wife at bedside, Dr. Maree with critical care spoke with her about code status and potential for intubation. She in unsure of code status at this time. Would like to look at his living will and talk to sons.  Prior to transport patient is maxed on peripheral levo gtt with pressures still in the 70s. Dr. Maree notified and will meet us  at bedside on [facility].   BP 93/41 HR 103 RR 40 O2 95% (2L) Temp 104.3  Interventions:  VS IV obtained Tylenol  suppository Levo gtt Tx ICU  Plan of Care:  Transfer to ICU care taken over by critical care.   Event Summary:   MD Notified: Dr. Franky Call Time: 03.49 Arrival Time: 0353 End Time:0530  Randall Daub, RN

## 2024-07-05 NOTE — Progress Notes (Signed)
 eLink Physician-Brief Progress Note Patient Name: [name] DOB: 1930-02-04 MRN: [id number]   Date of Service  07/05/2024  HPI/Events of Note  89 y/o male with PMH for pre-ILD on steroids, HTN, HLD, HFpEF, Paroxysmal Atrial Fib. On Eliquis , PE/DVT, AAA s/p Graft 2018, BPH with LUTS resulting in urinary retention resulting in suprapubic catheter, penile implant infection s/p removal who presented from Pulmonary clinic with sepsis and pneumonia.  Worsening lactic acidosis and persistent neutropenia.  Transferred to the ICU for septic shock.  Patient is febrile, tachypneic, tachycardic, and hypotensive.  Currently on norepinephrine  infusion and broad-spectrum antibiotics.  Results show anion gap metabolic acidosis, elevated creatinine, worsening lactic acidosis.  Imaging reviewed with pleural effusions, pulmonary densities, and perinephric stranding.  eICU Interventions  Maintain broad-spectrum antibiotics with cefepime , linezolid , and Flagyl .  Consider discontinuation of Flagyl  as it likely provides redundant coverage.  Switch chronic prednisone  taper to stress dose steroids with hydrocortisone .  Maintain norepinephrine  as needed for MAP greater than 65.  Continuous crystalloid infusion.  Consider initiation of DVT chemoprophylaxis, SCDs for now GI prophylaxis not indicated     Intervention Category Evaluation Type: New Patient Evaluation  [name] 07/05/2024, 5:32 AM

## 2024-07-05 NOTE — Progress Notes (Signed)
 Notified on-call physician regarding change in BP, now hypotensive.  Orders given. Closely monitoring.  07/05/24 0331  Vitals  Temp (!) 101.1 F (38.4 C)  Temp Source Axillary  BP (!) 80/46  MAP (mmHg) (!) 56  BP Location Left Arm  BP Method Automatic  Patient Position (if appropriate) Lying  Pulse Rate (!) 108  Pulse Rate Source Monitor  ECG Heart Rate (!) 109  Resp 20  MEWS COLOR  MEWS Score Color Red  Oxygen  Therapy  SpO2 95 %  O2 Device Nasal Cannula  MEWS Score  MEWS Temp 1  MEWS Systolic 2  MEWS Pulse 1  MEWS RR 0  MEWS LOC 0  MEWS Score 4

## 2024-07-05 NOTE — Consult Note (Signed)
 [name]

## 2024-07-05 NOTE — Progress Notes (Signed)
 Patient with increased confusion and agitation. Striking personal home attendant and climbing out of bed. Concerning vitals with climbing temp and heart rate. On-call physician has received and responded to several pages of my pages including critical lab. Physician at the bedside explaining to the patient's spouse intervention and plan to address current clinical picture.

## 2024-07-05 NOTE — Progress Notes (Signed)
 Progress Note Case d/w wife at bedside and sons on phone.  Wife would like comfort measures.  Patient now DNR and comfort. The patient is critically ill with multiple organ system failure and requires high complexity decision making for assessment and support, frequent evaluation and titration of therapies, advanced monitoring, review of radiographic studies and interpretation of complex data.   Critical Care Time devoted to patient care services, exclusive of separately billable procedures, described in this note is 30 extra minutes.   Orlin Fairly, MD [facility] See Amion for pager  If no response to pager , please call [phone] until 7pm After 7:00 pm call Elink  [phone] 07/05/2024, 7:03 AM

## 2024-07-05 NOTE — Progress Notes (Addendum)
 NAME:  [name], MRN:  [id number], DOB:  August 19, 1930, LOS: 1 ADMISSION DATE:  07/04/2024, CONSULTATION DATE:  07/04/2024 REFERRING MD:  Dr. Cottie - EDP, CHIEF COMPLAINT:  Sepsis    History of Present Illness:  [name] is a 89 year old male with a past medical history significant for ILD HTN HLD, HFpEF, paroxysmal atrial fibrillation anticoagulated with Eliquis , prior PE/DVT, AAA s/p graft 2018 BPH with LUTS resulting in urinary retention and need for suprapubic catheter, anxiety and depression who presented to the ED at direction of pulmonary clinic for workup of possible sepsis secondary to pneumonia.  Family reports low-grade temperature of 100.3, chills, cough increased supplemental oxygen  demand. Of note patient was recently discharged 7/30 for admission of penile implant infection treated with 14 days of Cipro  and recent Augment to treat CAP. On ED arrival patient was hemodynamically stable.  Lab work significant for NA 134 creatinine 1.37 BUN 2.8, lactic 2.0, BNP 123.4, WBC 8.91.1 platelet 427.  Chest x-ray with slightly increased bilateral pulmonary edema chronic UIP findings. Given ILD history of concern for underlying PNA PCCM consulted for assistance in management   Pertinent  Medical History   ILD HTN HLD, HFpEF, paroxysmal atrial fibrillation anticoagulated with Eliquis , prior PE/DVT, AAA s/p graft 2018 BPH with LUTS resulting in urinary retention and need for suprapubic catheter, anxiety and depression   Significant Hospital Events: Including procedures, antibiotic start and stop dates in addition to other pertinent events   8/7 presented with cough, fever, fatigue, and increased oxygen  demans  8/8 txf to ICU for septic shock. Subsequent transition to comfort care   Interim History / Subjective:  Decompensated overnight and txf to ICU for tx septic shock CT c/a/p w perinephric stranding   Objective    Blood pressure 101/78, pulse (!) 108, temperature (!) 102.7 F (39.3  C), temperature source Oral, resp. rate (!) 23, height 6' (1.829 m), weight 80.7 kg, SpO2 97%.        Intake/Output Summary (Last 24 hours) at 07/05/2024 0757 Last data filed at 07/05/2024 0700 Gross per 24 hour  Intake 2036.37 ml  Output 2100 ml  Net -63.63 ml   Filed Weights   07/04/24 1339  Weight: 80.7 kg    Examination: General: Critically ill elderly M uncomfortable appearing  HEENT: NCAT  Neuro: Lethargic  RC:ujrybrjmpir  PULM:  Incr RR  GI: round  Skin: diaphoretic   Resolved problem list   Assessment and Plan    DNR Goals of care discussion Encounter for palliative care  Septic shock Lactic acidosis  Suspected penile infection Possible bacteremia  Complicated UTI  CAP Acute on chronic hypoxic resp failure Pre-ILD Bilateral pleural effusions  Pulmonary edema  AKI  Hx PE/DVT Chronic AC (eliquis ) AAA s/p graft BPH, LUTS  Hypoglycemia Elevated LFTs Hypomagnesemia  P: -transitioning to comfort care -DNR -orders placed for comfort medications -no further labs imaging, no further labs not aimed at comfort (when morphine  gtt optimized, dc NE. No uptitration in interim) -unrestricted visitation  Best Practice (right click and Reselect all SmartList Selections daily)  Code status- DNR Diet: pleasure feeds DVT ppx- n/a  GI ppx n/a  L/T/D- suprapubic cath  Family discussion- 07/05/24  Labs   CBC: Recent Labs  Lab 07/04/24 1522 07/05/24 0409  WBC 20.9* 27.9*  NEUTROABS 18.7*  --   HGB 11.1* 11.5*  HCT 34.7* 34.3*  MCV 105.2* 101.2*  PLT 427* 322    Basic Metabolic Panel: Recent Labs  Lab 07/04/24 1522  07/05/24 0409  NA 134* 138  K 4.7 4.8  CL 97* 103  CO2 26 19*  GLUCOSE 131* 91  BUN 42* 47*  CREATININE 1.37* 2.28*  CALCIUM  9.2 8.8*  MG  --  1.4*  PHOS  --  3.0   GFR: Estimated Creatinine Clearance: 23.2 mL/min (A) (by C-G formula based on SCr of 2.28 mg/dL (H)). Recent Labs  Lab 07/04/24 1522 07/04/24 1536 07/04/24 1751  07/04/24 1950 07/04/24 2358 07/05/24 0409 07/05/24 0410  PROCALCITON  --   --   --   --   --  59.37  --   WBC 20.9*  --   --   --   --  27.9*  --   LATICACIDVEN  --    < > 2.4* 3.2* 5.2*  --  5.5*   < > = values in this interval not displayed.    Liver Function Tests: Recent Labs  Lab 07/04/24 1522 07/05/24 0409  AST 22 48*  ALT 30 45*  ALKPHOS 65 73  BILITOT 0.6 1.1  PROT 6.1* 5.2*  ALBUMIN  2.8* 2.3*   No results for input(s): LIPASE, AMYLASE in the last 168 hours. Recent Labs  Lab 07/04/24 2030  AMMONIA 28    ABG    Component Value Date/Time   PHART 7.44 07/04/2024 2222   PCO2ART 33 07/04/2024 2222   PO2ART 62 (L) 07/04/2024 2222   HCO3 21.8 07/04/2024 2222   TCO2 22 03/10/2023 1225   ACIDBASEDEF 0.9 07/04/2024 2222   O2SAT 88.4 07/04/2024 2222     Coagulation Profile: Recent Labs  Lab 07/04/24 1522  INR 1.2    Cardiac Enzymes: No results for input(s): CKTOTAL, CKMB, CKMBINDEX, TROPONINI in the last 168 hours.  HbA1C: Hgb A1c MFr Bld  Date/Time Value Ref Range Status  03/07/2023 02:39 AM 6.3 (H) 4.8 - 5.6 % Final    Comment:    (NOTE) Pre diabetes:          5.7%-6.4%  Diabetes:              >6.4%  Glycemic control for   <7.0% adults with diabetes     CBG: Recent Labs  Lab 07/05/24 0614  GLUCAP 65*    CRITICAL CARE Performed by: Ronnald FORBES Gave   Total critical care time: 35 minutes  Critical care time was exclusive of separately billable procedures and treating other patients. Critical care was necessary to treat or prevent imminent or life-threatening deterioration.  Critical care was time spent personally by me on the following activities: development of treatment plan with patient and/or surrogate as well as nursing, discussions with consultants, evaluation of patient's response to treatment, examination of patient, obtaining history from patient or surrogate, ordering and performing treatments and interventions,  ordering and review of laboratory studies, ordering and review of radiographic studies, pulse oximetry and re-evaluation of patient's condition.  Ronnald Gave MSN, AGACNP-BC [facility] Amion for pager  07/05/2024, 7:57 AM

## 2024-07-06 DIAGNOSIS — A419 Sepsis, unspecified organism: Secondary | ICD-10-CM | POA: Diagnosis not present

## 2024-07-06 MED ORDER — GLYCOPYRROLATE 0.2 MG/ML IJ SOLN
0.2000 mg | Freq: Two times a day (BID) | INTRAMUSCULAR | Status: DC | PRN
  Administered 2024-07-06: 0.2 mg via INTRAVENOUS

## 2024-07-06 MED ORDER — GLYCOPYRROLATE 0.2 MG/ML IJ SOLN
0.2000 mg | INTRAMUSCULAR | Status: DC | PRN
  Filled 2024-07-06: qty 1

## 2024-07-06 NOTE — Plan of Care (Signed)
   Problem: Education: Goal: Knowledge of General Education information will improve Description Including pain rating scale, medication(s)/side effects and non-pharmacologic comfort measures Outcome: Progressing

## 2024-07-06 NOTE — Progress Notes (Addendum)
 Called to bedside due to family concerns about plan of care.   Spoke with wife at bedside, two sons Sydnee in person, another one on the phone.) They express concern about the decision made to transition to comfort measures.  Specifically they are concerned that his vital signs seem more normal and he appears very comfortable.  Patient's son who is [place] is especially concerned and questioning if they made the right decision.  Patient is currently receiving as needed Versed  and is on a morphine  drip at 5 mg with occasional bolus.  Notified by nursing that his extremities are cold and mottled and he is minimally to nonresponsive.  No respiratory distress.  His labs were earlier this admission show signs of multiorgan failure from sepsis, pneumonia, UTI.   I reviewed his illness both chronic and acute with the patient's family, as well as the decision to transition to comfort measures which was made in the middle of the night.  Patient's family thought that he might die a lot sooner based on what was described to them about his condition last night.  We reviewed the natural dying process when the patient transitions from critical illness to comfort measures.  Often patients appear comfortable and vital signs stabilize once transitioned to comfort measures. Currently we are taking her cues from Mr. Seyler and supporting his symptoms as needed.  Ultimately the family believes in God and understanding that the father will die on his own time and that we are here to support him.  However, I do anticipate a hospital death on the order of hours to days. All questions answered.  They are comfortable with the current care plan.  Total time spent 25 minutes  Joya Willmott, MD Pulmonary and Critical Care Medicine [facility] 07/06/2024 2:37 AM Pager: see AMION  If no response to pager, please call critical care on call (see AMION) until 7pm After 7:00 pm call Elink

## 2024-07-06 NOTE — Progress Notes (Signed)
 eLink Physician-Brief Progress Note Patient Name: [name] DOB: 12-Apr-1930 MRN: [id number]   Date of Service  07/06/2024  HPI/Events of Note  Family is reconsidering previous decision to go comfort care.  Multiple family members involved in the decision-making. [name], wife, is at bedside.  Non camera room.  eICU Interventions  Requesting ground team evaluation     Intervention Category Minor Interventions: Clinical assessment - ordering diagnostic tests  [name] 07/06/2024, 1:23 AM

## 2024-07-06 NOTE — Progress Notes (Addendum)
 Offered to bathe patient and family stated not at this time, wife washed face, hands etc. Personal caregiver at bedside while family left unit to eat. Pt appears to be comfortable without agitation and resting with eyes closed. 2L O2 in place for comfort.    Pt family requested to speak with provider regarding what led to decision to discuss comfort care/DNR, and the process of sepsis in more detail. Team came to bedside and met with wife, son and son via telephone. All questions were answered

## 2024-07-06 NOTE — Progress Notes (Signed)
 PROGRESS NOTE    [name]  FMW:[id number] DOB: June 22, 1930 DOA: 07/04/2024 PCP: Shayne Anes, MD    Brief Narrative:  [name] is a 89 year old male with a past medical history significant for ILD HTN HLD, HFpEF, paroxysmal atrial fibrillation anticoagulated with Eliquis , prior PE/DVT, AAA s/p graft 2018 BPH with LUTS resulting in urinary retention and need for suprapubic catheter, anxiety and depression who presented to the ED at direction of pulmonary clinic for workup of possible sepsis secondary to pneumonia.  Family reports low-grade temperature of 100.3, chills, cough increased supplemental oxygen  demand. Of note patient was recently discharged 7/30 for admission of penile implant infection treated with 14 days of Cipro  and recent Augment to treat CAP. On ED arrival patient was hemodynamically stable.  Lab work significant for NA 134 creatinine 1.37 BUN 2.8, lactic 2.0, BNP 123.4, WBC 8.91.1 platelet 427.  Chest x-ray with slightly increased bilateral pulmonary edema chronic UIP findings. Given ILD history of concern for underlying PNA PCCM consulted for assistance in management    Assessment and Plan:  Sepsis from possible aspiration pneumonia/ community acquired pneumonia vs UTI  -worsened overnight on day of admission and now Comfort care -DNR -orders placed for comfort medications -no further labs imaging, no further labs not aimed at comfort (when morphine  gtt optimized, dc NE. No uptitration in interim) -unrestricted visitation  DVT prophylaxis:     Code Status: Do not attempt resuscitation (DNR) - Comfort care Family Communication: wife at bedside  Disposition Plan:  Level of care: Palliative Care Status is: Inpatient       Subjective: Appears comfortable in the bed  Objective: Vitals:   07/05/24 1952 07/05/24 2106 07/06/24 0020 07/06/24 0032  BP: (!) 145/67 (!) 68/48  (!) 69/44  Pulse: 60 (!) 54  80  Resp: 16 12    Temp: 97.9 F (36.6 C) 97.6 F  (36.4 C) 98.2 F (36.8 C) 98.2 F (36.8 C)  TempSrc: Oral Axillary Axillary Axillary  SpO2: 99% 97%  100%  Weight:      Height:        Intake/Output Summary (Last 24 hours) at 07/06/2024 1045 Last data filed at 07/05/2024 1700 Gross per 24 hour  Intake 35.04 ml  Output --  Net 35.04 ml   Filed Weights   07/04/24 1339  Weight: 80.7 kg    Examination:   Coarse breath sounds, appears comfortable-- on O2    Data Reviewed: I have personally reviewed following labs and imaging studies  CBC: Recent Labs  Lab 07/04/24 1522 07/05/24 0409  WBC 20.9* 27.9*  NEUTROABS 18.7*  --   HGB 11.1* 11.5*  HCT 34.7* 34.3*  MCV 105.2* 101.2*  PLT 427* 322   Basic Metabolic Panel: Recent Labs  Lab 07/04/24 1522 07/05/24 0409  NA 134* 138  K 4.7 4.8  CL 97* 103  CO2 26 19*  GLUCOSE 131* 91  BUN 42* 47*  CREATININE 1.37* 2.28*  CALCIUM  9.2 8.8*  MG  --  1.4*  PHOS  --  3.0   GFR: Estimated Creatinine Clearance: 23.2 mL/min (A) (by C-G formula based on SCr of 2.28 mg/dL (H)). Liver Function Tests: Recent Labs  Lab 07/04/24 1522 07/05/24 0409  AST 22 48*  ALT 30 45*  ALKPHOS 65 73  BILITOT 0.6 1.1  PROT 6.1* 5.2*  ALBUMIN  2.8* 2.3*   No results for input(s): LIPASE, AMYLASE in the last 168 hours. Recent Labs  Lab 07/04/24 2030  AMMONIA 28  Coagulation Profile: Recent Labs  Lab 07/04/24 1522  INR 1.2   Cardiac Enzymes: No results for input(s): CKTOTAL, CKMB, CKMBINDEX, TROPONINI in the last 168 hours. BNP (last 3 results) Recent Labs    12/27/23 1607  PROBNP 99.0   HbA1C: No results for input(s): HGBA1C in the last 72 hours. CBG: Recent Labs  Lab 07/05/24 0614  GLUCAP 65*   Lipid Profile: No results for input(s): CHOL, HDL, LDLCALC, TRIG, CHOLHDL, LDLDIRECT in the last 72 hours. Thyroid  Function Tests: No results for input(s): TSH, T4TOTAL, FREET4, T3FREE, THYROIDAB in the last 72 hours. Anemia Panel: No  results for input(s): VITAMINB12, FOLATE, FERRITIN, TIBC, IRON, RETICCTPCT in the last 72 hours. Sepsis Labs: Recent Labs  Lab 07/04/24 1751 07/04/24 1950 07/04/24 2358 07/05/24 0409 07/05/24 0410  PROCALCITON  --   --   --  59.37  --   LATICACIDVEN 2.4* 3.2* 5.2*  --  5.5*    Recent Results (from the past 240 hours)  Culture, blood (Routine x 2)     Status: None (Preliminary result)   Collection Time: 07/04/24  3:28 PM   Specimen: BLOOD RIGHT FOREARM  Result Value Ref Range Status   Specimen Description BLOOD RIGHT FOREARM  Final   Special Requests   Final    BOTTLES DRAWN AEROBIC AND ANAEROBIC Blood Culture adequate volume   Culture   Final    NO GROWTH 2 DAYS Performed at [facility], [address], [place], [place] [address]    Report Status PENDING  Incomplete  Resp panel by RT-PCR (RSV, Flu A&B, Covid)     Status: None   Collection Time: 07/04/24  3:28 PM   Specimen: Nasal Swab  Result Value Ref Range Status   SARS Coronavirus 2 by RT PCR NEGATIVE NEGATIVE Final   Influenza A by PCR NEGATIVE NEGATIVE Final   Influenza B by PCR NEGATIVE NEGATIVE Final    Comment: (NOTE) The Xpert Xpress SARS-CoV-2/FLU/RSV plus assay is intended as an aid in the diagnosis of influenza from Nasopharyngeal swab specimens and should not be used as a sole basis for treatment. Nasal washings and aspirates are unacceptable for Xpert Xpress SARS-CoV-2/FLU/RSV testing.  Fact Sheet for Patients: BloggerCourse.com  Fact Sheet for Healthcare Providers: SeriousBroker.it  This test is not yet approved or cleared by the [place]  FDA and has been authorized for detection and/or diagnosis of SARS-CoV-2 by FDA under an Emergency Use Authorization (EUA). This EUA will remain in effect (meaning this test can be used) for the duration of the COVID-19 declaration under Section 564(b)(1) of the Act, 21 U.S.C. section  360bbb-3(b)(1), unless the authorization is terminated or revoked.     Resp Syncytial Virus by PCR NEGATIVE NEGATIVE Final    Comment: (NOTE) Fact Sheet for Patients: BloggerCourse.com  Fact Sheet for Healthcare Providers: SeriousBroker.it  This test is not yet approved or cleared by the [place]  FDA and has been authorized for detection and/or diagnosis of SARS-CoV-2 by FDA under an Emergency Use Authorization (EUA). This EUA will remain in effect (meaning this test can be used) for the duration of the COVID-19 declaration under Section 564(b)(1) of the Act, 21 U.S.C. section 360bbb-3(b)(1), unless the authorization is terminated or revoked.  Performed at [facility], [address] [place], [place] [address]   Urine Culture     Status: Abnormal   Collection Time: 07/04/24  3:28 PM   Specimen: Urine, Clean Catch  Result Value Ref Range Status  Specimen Description URINE, CLEAN CATCH  Final   Special Requests   Final    NONE Reflexed from [id number] Performed at [facility], [address] [place], [place] [address]    Culture 60,000 COLONIES/mL YEAST (A)  Final   Report Status 07/05/2024 FINAL  Final  Respiratory (~20 pathogens) panel by PCR     Status: None   Collection Time: 07/04/24  3:28 PM   Specimen: Nasopharyngeal Swab; Respiratory  Result Value Ref Range Status   Adenovirus NOT DETECTED NOT DETECTED Final   Coronavirus 229E NOT DETECTED NOT DETECTED Final    Comment: (NOTE) The Coronavirus on the Respiratory Panel, DOES NOT test for the novel  Coronavirus (2019 nCoV)    Coronavirus HKU1 NOT DETECTED NOT DETECTED Final   Coronavirus NL63 NOT DETECTED NOT DETECTED Final   Coronavirus OC43 NOT DETECTED NOT DETECTED Final   Metapneumovirus NOT DETECTED NOT DETECTED Final   Rhinovirus / Enterovirus NOT DETECTED NOT DETECTED Final   Influenza A NOT DETECTED NOT DETECTED Final   Influenza B NOT  DETECTED NOT DETECTED Final   Parainfluenza Virus 1 NOT DETECTED NOT DETECTED Final   Parainfluenza Virus 2 NOT DETECTED NOT DETECTED Final   Parainfluenza Virus 3 NOT DETECTED NOT DETECTED Final   Parainfluenza Virus 4 NOT DETECTED NOT DETECTED Final   Respiratory Syncytial Virus NOT DETECTED NOT DETECTED Final   Bordetella pertussis NOT DETECTED NOT DETECTED Final   Bordetella Parapertussis NOT DETECTED NOT DETECTED Final   Chlamydophila pneumoniae NOT DETECTED NOT DETECTED Final   Mycoplasma pneumoniae NOT DETECTED NOT DETECTED Final    Comment: Performed at [facility], [address], [place], [place] [address]  Culture, blood (Routine x 2)     Status: None (Preliminary result)   Collection Time: 07/04/24  4:00 PM   Specimen: BLOOD RIGHT ARM  Result Value Ref Range Status   Specimen Description BLOOD RIGHT ARM  Final   Special Requests   Final    BOTTLES DRAWN AEROBIC AND ANAEROBIC Blood Culture adequate volume   Culture   Final    NO GROWTH 2 DAYS Performed at [facility], [address], [place], [place] [address]    Report Status PENDING  Incomplete         Radiology Studies: CT ABDOMEN PELVIS WO CONTRAST Result Date: 07/05/2024 CLINICAL DATA:  Sepsis EXAM: CT ABDOMEN AND PELVIS WITHOUT CONTRAST TECHNIQUE: Multidetector CT imaging of the abdomen and pelvis was performed following the standard protocol without IV contrast. RADIATION DOSE REDUCTION: This exam was performed according to the departmental dose-optimization program which includes automated exposure control, adjustment of the mA and/or kV according to patient size and/or use of iterative reconstruction technique. COMPARISON:  CT 06/22/2024, chest x-ray 07/04/2024, chest CT 06/22/2024, 05/28/2022, 03/05/2023, chest CT 07/04/2024. FINDINGS: Lower chest: Lung bases demonstrate small bilateral pleural effusions. Cardiomegaly with coronary vascular calcification. Aneurysmal dilatation of the ascending aorta up  to 4.5 cm. Calcifications within the bilateral lower lobes likely sequela of prior aspiration. Background chronic interstitial lung disease with fibrosis. Hazy pulmonary density within the lower lung suspicious for superimposed edema on chronic lung disease. Hepatobiliary: No calcified gallstone or biliary dilatation. Scattered hepatic cysts. Pancreas: Unremarkable. No pancreatic ductal dilatation or surrounding inflammatory changes. Spleen: Normal in size without focal abnormality. Adrenals/Urinary Tract: Adrenal glands are normal. Excreted contrast within the kidneys. Cyst midpole right kidney, no specific imaging follow-up is recommended. Decompressed urinary bladder with both suprapubic catheter and Foley catheter.  The bladder wall appears diffusely thickened. Nonspecific perinephric stranding. Stomach/Bowel: The stomach is nonenlarged. No dilated small bowel. Large stool throughout the colon. No acute bowel wall thickening. Vascular/Lymphatic: Status post infrarenal abdominal aortoiliac stent graft. Residual aneurysm sac diameter of 6.7 by 5.4 cm, previously 6.8 x 5.7 cm. Aortic atherosclerosis. No suspicious lymph nodes. Reproductive: Slightly enlarged prostate. Penile prosthesis has been removed curvilinear densities in the region of prior penile prosthesis, may be surgical material. Curvilinear density or calcification deep to the right lower rectus at the site of prior portion of reservoir. Soft tissue thickening surrounding the penis may be residual postoperative change. Other: Negative for pelvic effusion or free air. Musculoskeletal: No acute or suspicious osseous abnormality. IMPRESSION: 1. Small bilateral pleural effusions. Hazy pulmonary density within the lower lungs suspicious for edema or pneumonia superimposed on underlying chronic lung disease. 2. Moderate perinephric stranding similar compared with July, increased compared with exam from June, nonspecific but correlate for upper urinary tract  infection. Bladder is decompressed by suprapubic and Foley catheters and appears thick walled. 3. Interval removal of penile prosthesis. Curvilinear densities in the region of prior penile prosthesis and deep to the right lower quadrant anterior abdominal wall, may be surgical material. Curvilinear density or calcification deep to the right lower rectus at the site of prior portion of reservoir. Soft tissue thickening surrounding the penis may be residual postoperative change. 4. Status post infrarenal abdominal aorto bi iliac graft. Stable residual aneurysm sac diameter up to 6.7 cm. 5. Aortic atherosclerosis. Aortic Atherosclerosis (ICD10-I70.0). Electronically Signed   By: Luke Bun M.D.   On: 07/05/2024 01:24   CT Angio Chest PE W and/or Wo Contrast Result Date: 07/04/2024 CLINICAL DATA:  Pulmonary embolism (PE) suspected, high prob hx of ILD, recurring PNA, and PE - here with new worsening hypoxia EXAM: CT ANGIOGRAPHY CHEST WITH CONTRAST TECHNIQUE: Multidetector CT imaging of the chest was performed using the standard protocol during bolus administration of intravenous contrast. Multiplanar CT image reconstructions and MIPs were obtained to evaluate the vascular anatomy. RADIATION DOSE REDUCTION: This exam was performed according to the departmental dose-optimization program which includes automated exposure control, adjustment of the mA and/or kV according to patient size and/or use of iterative reconstruction technique. CONTRAST:  75mL OMNIPAQUE  IOHEXOL  350 MG/ML SOLN COMPARISON:  07/04/2024, 06/22/2024 FINDINGS: Cardiovascular: This is a technically adequate evaluation of the pulmonary vasculature. No filling defects or pulmonary emboli. 4.6 cm ascending thoracic aortic aneurysm is again noted. Persistent aneurysmal dilation of the aortic arch, measuring 4 cm in the proximal aspect, 4.4 cm within the mid arch, and 4.8 cm within the distal aortic arch. Assessment of the vascular lumen is slightly  limited due to timing of contrast bolus, but there is no evidence of dissection. Diffuse atherosclerosis of the aorta and coronary vasculature is noted. Prominent calcifications of the aortic valve are unchanged. No pericardial effusion. Mediastinum/Nodes: No enlarged mediastinal, hilar, or axillary lymph nodes. Thyroid  gland, trachea, and esophagus demonstrate no significant findings. Lungs/Pleura: There are small bilateral pleural effusions, slightly increased since prior CT. Dependent areas of consolidation within the lungs compatible with atelectasis. High attenuation material dependent within the lower lobes likely reflects sequela of prior aspiration, stable. The basilar predominant subpleural scarring and fibrosis seen on prior study is again noted, compatible with UIP. However, there is increased interlobular septal thickening noted on this exam, which may reflect an element of interstitial edema. No dense consolidation or pneumothorax. The central airways are patent. Upper Abdomen: No acute abnormality.  Musculoskeletal: No acute or destructive bony abnormalities. Reconstructed images demonstrate no additional findings. Review of the MIP images confirms the above findings. IMPRESSION: 1. No evidence of pulmonary embolus. 2. Interval increase in interlobular septal thickening with slight enlargement of bilateral pleural effusions, compatible with developing pulmonary edema superimposed upon findings of chronic UIP. 3. Thoracic aortic aneurysm as above, involving the ascending aorta and aortic arch. Maximal diameter 4.8 cm within the distal arch, with no evidence of dissection. Recommend semi-annual imaging followup by CTA or MRA and referral to cardiothoracic surgery if not already obtained. This recommendation follows 2010 ACCF/AHA/AATS/ACR/ASA/SCA/SCAI/SIR/STS/SVM Guidelines for the Diagnosis and Management of Patients With Thoracic Aortic Disease. Circulation. 2010; 121: [id number]. Aortic aneurysm NOS  (ICD10-I71.9) Ascending thoracic aortic aneurysm. 4. Aortic Atherosclerosis (ICD10-I70.0). Coronary artery atherosclerosis. Electronically Signed   By: Ozell Daring M.D.   On: 07/04/2024 16:42   DG Chest Port 1 View if patient is in a treatment room. Result Date: 07/04/2024 CLINICAL DATA:  Suspected sepsis. EXAM: PORTABLE CHEST 1 VIEW COMPARISON:  Chest CT dated 06/21/2024. FINDINGS: Shallow inspiration. Faint interstitial densities may represent atypical infiltrate. No pleural effusion pneumothorax. The cardiac silhouette is within normal limits. No acute osseous pathology. IMPRESSION: Faint interstitial densities may represent atypical infiltrate. Electronically Signed   By: Vanetta Chou M.D.   On: 07/04/2024 14:47        Scheduled Meds: Continuous Infusions:  morphine  5 mg/hr (07/06/24 0003)     LOS: 2 days    Time spent: 45 minutes spent on chart review, discussion with nursing staff, consultants, updating family and interview/physical exam; more than 50% of that time was spent in counseling and/or coordination of care.    Harlene RAYMOND Bowl, DO [facility] Available via Epic secure chat 7am-7pm After these hours, please refer to coverage provider listed on [email or web address] 07/06/2024, 10:45 AM

## 2024-07-06 NOTE — Plan of Care (Signed)
  Problem: Skin Integrity: Goal: Risk for impaired skin integrity will decrease Outcome: Progressing   Problem: Safety: Goal: Ability to remain free from injury will improve Outcome: Progressing   

## 2024-07-09 LAB — CULTURE, BLOOD (ROUTINE X 2)
Culture: NO GROWTH
Culture: NO GROWTH
Special Requests: ADEQUATE
Special Requests: ADEQUATE

## 2024-07-22 ENCOUNTER — Encounter

## 2024-07-23 ENCOUNTER — Ambulatory Visit: Admitting: Internal Medicine

## 2024-07-29 NOTE — Plan of Care (Signed)
  Problem: Pain Managment: Goal: General experience of comfort will improve and/or be controlled Outcome: Progressing

## 2024-07-29 NOTE — Progress Notes (Signed)
    Patient Name: [name]           DOB: 1930/06/04  MRN: [id number]       OVERNIGHT EVENT    Notified by RN that patient has expired at 0540.  2 RN verified. Patient was comfort care.   Family is at bedside.     Mahitha Hickling, DNP, ACNPC- AG [facility]

## 2024-07-29 NOTE — Progress Notes (Signed)
 [phone] nurse tech reported that the patient is no longer breathing. Went to the patient room and re checked vital signs and there is no pulse and no breathing. Informed charge nurse about the finding.   [phone] patient declared expired. Please see flowsheet.

## 2024-07-29 NOTE — Discharge Summary (Signed)
 Expiration Note  LORNE WINKELS  MR#: [id number]  DOB:01-27-1930  Date of Admission: 08/03/24 Date of Death: August 07, 2024  Attending Physician:Keyvin Rison U Juvenal  Patient's PCP: Shayne Anes, MD    Cause of Death: Sepsis [facility])  Secondary Diagnoses Present on Admission:  Sepsis [facility])  GERALD KUEHL is a 89 year old male with a past medical history significant for ILD HTN HLD, HFpEF, paroxysmal atrial fibrillation anticoagulated with Eliquis , prior PE/DVT, AAA s/p graft 2018 BPH with LUTS resulting in urinary retention and need for suprapubic catheter, anxiety and depression who presented to the ED at direction of pulmonary clinic for workup of possible sepsis secondary to pneumonia. Family reports low-grade temperature of 100.3, chills, cough increased supplemental oxygen  demand. Of note patient was recently discharged 7/30 for admission of penile implant infection treated with 14 days of Cipro  and recent Augment to treat CAP. On ED arrival patient was hemodynamically stable. Lab work significant for NA 134 creatinine 1.37 BUN 2.8, lactic 2.0, BNP 123.4, WBC 8.91.1 platelet 427. Chest x-ray with slightly increased bilateral pulmonary edema chronic UIP findings. Given ILD history of concern for underlying PNA PCCM consulted for assistance in management   Hospital Course: Sepsis from possible aspiration pneumonia/ community acquired pneumonia vs UTI  -worsened overnight on day of admission and now Comfort care -DNR -orders placed for comfort medications -no further labs imaging, no further labs not aimed at comfort (when morphine  gtt optimized, dc NE. No uptitration in interim)  Labs/Results: No results found for this or any previous visit (from the past 48 hours).  CT ABDOMEN PELVIS WO CONTRAST Result Date: 07/05/2024 CLINICAL DATA:  Sepsis EXAM: CT ABDOMEN AND PELVIS WITHOUT CONTRAST TECHNIQUE: Multidetector CT imaging of the abdomen and pelvis was performed following the standard  protocol without IV contrast. RADIATION DOSE REDUCTION: This exam was performed according to the departmental dose-optimization program which includes automated exposure control, adjustment of the mA and/or kV according to patient size and/or use of iterative reconstruction technique. COMPARISON:  CT 06/22/2024, chest x-ray 08-03-2024, chest CT 06/22/2024, 05/28/2022, 03/05/2023, chest CT August 03, 2024. FINDINGS: Lower chest: Lung bases demonstrate small bilateral pleural effusions. Cardiomegaly with coronary vascular calcification. Aneurysmal dilatation of the ascending aorta up to 4.5 cm. Calcifications within the bilateral lower lobes likely sequela of prior aspiration. Background chronic interstitial lung disease with fibrosis. Hazy pulmonary density within the lower lung suspicious for superimposed edema on chronic lung disease. Hepatobiliary: No calcified gallstone or biliary dilatation. Scattered hepatic cysts. Pancreas: Unremarkable. No pancreatic ductal dilatation or surrounding inflammatory changes. Spleen: Normal in size without focal abnormality. Adrenals/Urinary Tract: Adrenal glands are normal. Excreted contrast within the kidneys. Cyst midpole right kidney, no specific imaging follow-up is recommended. Decompressed urinary bladder with both suprapubic catheter and Foley catheter. The bladder wall appears diffusely thickened. Nonspecific perinephric stranding. Stomach/Bowel: The stomach is nonenlarged. No dilated small bowel. Large stool throughout the colon. No acute bowel wall thickening. Vascular/Lymphatic: Status post infrarenal abdominal aortoiliac stent graft. Residual aneurysm sac diameter of 6.7 by 5.4 cm, previously 6.8 x 5.7 cm. Aortic atherosclerosis. No suspicious lymph nodes. Reproductive: Slightly enlarged prostate. Penile prosthesis has been removed curvilinear densities in the region of prior penile prosthesis, may be surgical material. Curvilinear density or calcification deep to the  right lower rectus at the site of prior portion of reservoir. Soft tissue thickening surrounding the penis may be residual postoperative change. Other: Negative for pelvic effusion or free air. Musculoskeletal: No acute or suspicious osseous abnormality. IMPRESSION: 1. Small bilateral pleural effusions.  Hazy pulmonary density within the lower lungs suspicious for edema or pneumonia superimposed on underlying chronic lung disease. 2. Moderate perinephric stranding similar compared with July, increased compared with exam from June, nonspecific but correlate for upper urinary tract infection. Bladder is decompressed by suprapubic and Foley catheters and appears thick walled. 3. Interval removal of penile prosthesis. Curvilinear densities in the region of prior penile prosthesis and deep to the right lower quadrant anterior abdominal wall, may be surgical material. Curvilinear density or calcification deep to the right lower rectus at the site of prior portion of reservoir. Soft tissue thickening surrounding the penis may be residual postoperative change. 4. Status post infrarenal abdominal aorto bi iliac graft. Stable residual aneurysm sac diameter up to 6.7 cm. 5. Aortic atherosclerosis. Aortic Atherosclerosis (ICD10-I70.0). Electronically Signed   By: Luke Bun M.D.   On: 07/05/2024 01:24   CT Angio Chest PE W and/or Wo Contrast Result Date: 07/04/2024 CLINICAL DATA:  Pulmonary embolism (PE) suspected, high prob hx of ILD, recurring PNA, and PE - here with new worsening hypoxia EXAM: CT ANGIOGRAPHY CHEST WITH CONTRAST TECHNIQUE: Multidetector CT imaging of the chest was performed using the standard protocol during bolus administration of intravenous contrast. Multiplanar CT image reconstructions and MIPs were obtained to evaluate the vascular anatomy. RADIATION DOSE REDUCTION: This exam was performed according to the departmental dose-optimization program which includes automated exposure control, adjustment of  the mA and/or kV according to patient size and/or use of iterative reconstruction technique. CONTRAST:  75mL OMNIPAQUE  IOHEXOL  350 MG/ML SOLN COMPARISON:  07/04/2024, 06/22/2024 FINDINGS: Cardiovascular: This is a technically adequate evaluation of the pulmonary vasculature. No filling defects or pulmonary emboli. 4.6 cm ascending thoracic aortic aneurysm is again noted. Persistent aneurysmal dilation of the aortic arch, measuring 4 cm in the proximal aspect, 4.4 cm within the mid arch, and 4.8 cm within the distal aortic arch. Assessment of the vascular lumen is slightly limited due to timing of contrast bolus, but there is no evidence of dissection. Diffuse atherosclerosis of the aorta and coronary vasculature is noted. Prominent calcifications of the aortic valve are unchanged. No pericardial effusion. Mediastinum/Nodes: No enlarged mediastinal, hilar, or axillary lymph nodes. Thyroid  gland, trachea, and esophagus demonstrate no significant findings. Lungs/Pleura: There are small bilateral pleural effusions, slightly increased since prior CT. Dependent areas of consolidation within the lungs compatible with atelectasis. High attenuation material dependent within the lower lobes likely reflects sequela of prior aspiration, stable. The basilar predominant subpleural scarring and fibrosis seen on prior study is again noted, compatible with UIP. However, there is increased interlobular septal thickening noted on this exam, which may reflect an element of interstitial edema. No dense consolidation or pneumothorax. The central airways are patent. Upper Abdomen: No acute abnormality. Musculoskeletal: No acute or destructive bony abnormalities. Reconstructed images demonstrate no additional findings. Review of the MIP images confirms the above findings. IMPRESSION: 1. No evidence of pulmonary embolus. 2. Interval increase in interlobular septal thickening with slight enlargement of bilateral pleural effusions, compatible  with developing pulmonary edema superimposed upon findings of chronic UIP. 3. Thoracic aortic aneurysm as above, involving the ascending aorta and aortic arch. Maximal diameter 4.8 cm within the distal arch, with no evidence of dissection. Recommend semi-annual imaging followup by CTA or MRA and referral to cardiothoracic surgery if not already obtained. This recommendation follows 2010 ACCF/AHA/AATS/ACR/ASA/SCA/SCAI/SIR/STS/SVM Guidelines for the Diagnosis and Management of Patients With Thoracic Aortic Disease. Circulation. 2010; 121: Z733-z63. Aortic aneurysm NOS (ICD10-I71.9) Ascending thoracic aortic aneurysm. 4. Aortic  Atherosclerosis (ICD10-I70.0). Coronary artery atherosclerosis. Electronically Signed   By: Ozell Daring M.D.   On: 07/04/2024 16:42   DG Chest Port 1 View if patient is in a treatment room. Result Date: 07/04/2024 CLINICAL DATA:  Suspected sepsis. EXAM: PORTABLE CHEST 1 VIEW COMPARISON:  Chest CT dated 06/21/2024. FINDINGS: Shallow inspiration. Faint interstitial densities may represent atypical infiltrate. No pleural effusion pneumothorax. The cardiac silhouette is within normal limits. No acute osseous pathology. IMPRESSION: Faint interstitial densities may represent atypical infiltrate. Electronically Signed   By: Vanetta Chou M.D.   On: 07/04/2024 14:47   CT Chest High Resolution Result Date: 06/24/2024 CLINICAL DATA:  Diffuse interstitial lung disease EXAM: CT CHEST WITHOUT CONTRAST TECHNIQUE: Multidetector CT imaging of the chest was performed following the standard protocol without intravenous contrast. High resolution imaging of the lungs, as well as inspiratory and expiratory imaging, was performed. RADIATION DOSE REDUCTION: This exam was performed according to the departmental dose-optimization program which includes automated exposure control, adjustment of the mA and/or kV according to patient size and/or use of iterative reconstruction technique. COMPARISON:  Chest CT  January 08, 2024 FINDINGS: Cardiovascular: Aneurysmal dilation of ascending aorta up to 4.2 cm. Aneurysmal dilation of proximal descending aorta measuring up to 5.4 cm. Atherosclerotic calcifications of aorta, aortic annulus and coronary arteries. The heart size is enlarged. No pericardial effusion. Mediastinum/Nodes: Multiple subcentimeter bilateral paratracheal lymph nodes. Mildly patulous esophagus. Lungs/Pleura: Again seen there are peripheral and lower lobe predominant subpleural reticulation, interlobular septal thickening, mild architectural distortion, and bronchiectasis/bronchiolectasis with dendritic ossifications. Questionable honeycombing is forming in posterior right upper lobe. No significant mosaic ground-glass attenuation to suggest air trapping. No new suspicious pulmonary nodule. Diffuse bronchial and bronchiolar wall thickening. Bilateral small pleural effusions, stable to prior. Upper Abdomen: Multiple hypodense liver lesions similar to prior. Gallbladder is distended. Small hiatal hernia. Hypodense right kidney cortical lesion, incompletely included in the field of view likely a cortical simple degenerative changes cyst. Musculoskeletal: Degenerative changes of the spine. Small lipoma in left rhomboid muscle. IMPRESSION: Interstitial lung disease most consistent with UIP pattern, stable to prior. No new or suspicious pulmonary nodule. Recommend follow-up to ensure stability. Small bilateral pleural effusions increased to prior. Subjacent atelectasis of both lung base with dendritic ossifications likely post aspiration , infectious/inflammatory more conspicuous to prior. Ascending and descending thoracic aorta aneurysmal dilations, stable to prior. Recommend semiannual imaging follow-up by CTA or MRA. Aortic Atherosclerosis (ICD10-I70.0). Electronically Signed   By: Megan  Zare M.D.   On: 06/24/2024 13:29   ECHOCARDIOGRAM COMPLETE Result Date: 06/23/2024    ECHOCARDIOGRAM REPORT   Patient  Name:   DARE SANGER Foister Date of Exam: 06/23/2024 Medical Rec #:  [id number]      Height:       72.0 in Accession #:    [id number]     Weight:       184.7 lb Date of Birth:  [birth year]     BSA:          2.060 m Patient Age:    89 years       BP:           105/49 mmHg Patient Gender: M              HR:           70 bpm. Exam Location:  Inpatient Procedure: 2D Echo, Color Doppler and Cardiac Doppler (Both Spectral and Color            Flow Doppler were  utilized during procedure). Indications:    Acutre respiratory distress R06.03  History:        Patient has no prior history of Echocardiogram examinations,                 most recent 08/28/2023.  Sonographer:    Benard Stallion Referring Phys: [id number] MURALI RAMASWAMY IMPRESSIONS  1. Left ventricular ejection fraction, by estimation, is 55 to 60%. The left ventricle has normal function. The left ventricle has no regional wall motion abnormalities. Left ventricular diastolic parameters are indeterminate.  2. Right ventricular systolic function is normal. The right ventricular size is normal. There is normal pulmonary artery systolic pressure.  3. The mitral valve is grossly normal. Trivial mitral valve regurgitation. No evidence of mitral stenosis.  4. The aortic valve was not well visualized. Aortic valve regurgitation is not visualized. Mild aortic valve stenosis. Aortic valve area, by VTI measures 1.61 cm. Aortic valve mean gradient measures 8.0 mmHg. Aortic valve Vmax measures 1.94 m/s.  5. The inferior vena cava is normal in size with greater than 50% respiratory variability, suggesting right atrial pressure of 3 mmHg. Comparison(s): No significant change from prior study. FINDINGS  Left Ventricle: Left ventricular ejection fraction, by estimation, is 55 to 60%. The left ventricle has normal function. The left ventricle has no regional wall motion abnormalities. The left ventricular internal cavity size was normal in size. There is  no left ventricular hypertrophy.  Left ventricular diastolic parameters are indeterminate. Right Ventricle: The right ventricular size is normal. No increase in right ventricular wall thickness. Right ventricular systolic function is normal. There is normal pulmonary artery systolic pressure. The tricuspid regurgitant velocity is 2.28 m/s, and  with an assumed right atrial pressure of 3 mmHg, the estimated right ventricular systolic pressure is 23.8 mmHg. Left Atrium: Left atrial size was normal in size. Right Atrium: Right atrial size was normal in size. Pericardium: There is no evidence of pericardial effusion. Presence of epicardial fat layer. Mitral Valve: The mitral valve is grossly normal. Trivial mitral valve regurgitation. No evidence of mitral valve stenosis. Tricuspid Valve: The tricuspid valve is grossly normal. Tricuspid valve regurgitation is trivial. No evidence of tricuspid stenosis. Aortic Valve: The aortic valve was not well visualized. Aortic valve regurgitation is not visualized. Mild aortic stenosis is present. Aortic valve mean gradient measures 8.0 mmHg. Aortic valve peak gradient measures 15.0 mmHg. Aortic valve area, by VTI measures 1.61 cm. Pulmonic Valve: The pulmonic valve was grossly normal. Pulmonic valve regurgitation is not visualized. No evidence of pulmonic stenosis. Aorta: The aortic root is normal in size and structure. Venous: The inferior vena cava is normal in size with greater than 50% respiratory variability, suggesting right atrial pressure of 3 mmHg. IAS/Shunts: The atrial septum is grossly normal.  LEFT VENTRICLE PLAX 2D LVIDd:         3.90 cm   Diastology LVIDs:         2.80 cm   LV e' medial:    5.77 cm/s LV PW:         1.00 cm   LV E/e' medial:  14.5 LV IVS:        1.00 cm   LV e' lateral:   7.62 cm/s LVOT diam:     2.40 cm   LV E/e' lateral: 11.0 LV SV:         68 LV SV Index:   33 LVOT Area:     4.52 cm  RIGHT VENTRICLE RV Basal diam:  3.70 cm RV Mid diam:    2.70 cm RV S prime:     12.40 cm/s TAPSE  (M-mode): 2.4 cm LEFT ATRIUM             Index        RIGHT ATRIUM           Index LA diam:        3.60 cm 1.75 cm/m   RA Area:     20.70 cm LA Vol (A2C):   83.0 ml 40.29 ml/m  RA Volume:   59.00 ml  28.64 ml/m LA Vol (A4C):   38.5 ml 18.69 ml/m LA Biplane Vol: 56.9 ml 27.62 ml/m  AORTIC VALVE AV Area (Vmax):    1.63 cm AV Area (Vmean):   1.58 cm AV Area (VTI):     1.61 cm AV Vmax:           193.67 cm/s AV Vmean:          132.333 cm/s AV VTI:            0.422 m AV Peak Grad:      15.0 mmHg AV Mean Grad:      8.0 mmHg LVOT Vmax:         69.80 cm/s LVOT Vmean:        46.300 cm/s LVOT VTI:          0.150 m LVOT/AV VTI ratio: 0.36  AORTA Ao Root diam: 3.30 cm MITRAL VALVE               TRICUSPID VALVE MV Area (PHT): 3.99 cm    TR Peak grad:   20.8 mmHg MV Decel Time: 190 msec    TR Vmax:        228.00 cm/s MV E velocity: 83.60 cm/s MV A velocity: 67.30 cm/s  SHUNTS MV E/A ratio:  1.24        Systemic VTI:  0.15 m                            Systemic Diam: 2.40 cm Darryle Decent MD Electronically signed by Darryle Decent MD Signature Date/Time: 06/23/2024/12:50:27 PM    Final    CT ABDOMEN PELVIS WO CONTRAST Result Date: 06/22/2024 CLINICAL DATA:  Abdominal pain, postoperative sepsis. Diffuse interstitial lung disease. EXAM: CT ABDOMEN AND PELVIS WITHOUT CONTRAST TECHNIQUE: Multidetector CT imaging of the abdomen and pelvis was performed following the standard protocol without IV contrast. RADIATION DOSE REDUCTION: This exam was performed according to the departmental dose-optimization program which includes automated exposure control, adjustment of the mA and/or kV according to patient size and/or use of iterative reconstruction technique. COMPARISON:  CT abdomen and pelvis 05/24/2024 FINDINGS: Lower chest: There are peripheral interstitial opacities in the lung bases with small bilateral pleural effusions. Hepatobiliary: Small hepatic cysts are unchanged. Gallbladder and bile ducts are within normal limits.  Pancreas: Unremarkable. No pancreatic ductal dilatation or surrounding inflammatory changes. Spleen: Normal in size without focal abnormality. Adrenals/Urinary Tract: There is a 3.6 cm right renal cyst. There is mild nonspecific bilateral perinephric fat stranding which is new from prior. There is no hydronephrosis or urinary tract calculus. The adrenal glands are within normal limits. Suprapubic Foley catheter decompresses the bladder. Bladder wall thickening is not excluded. There is a small amount of air in the bladder. Skip Stomach/Bowel: Stomach is within normal limits. No evidence of bowel wall thickening, distention, or inflammatory changes. There is a large  amount of stool throughout the entire colon. Appendix is surgically absent. Vascular/Lymphatic: Infrarenal abdominal aortic aneurysm is again seen measuring 6.8 x 5.7 cm, unchanged in size. Patient is status post aorto bi-iliac graft. There is no periaortic fluid or stranding. IVC normal in size. No enlarged lymph nodes are identified. There are atherosclerotic calcifications throughout the aorta. Reproductive: Prostate gland is enlarged. Penile implant is present with reservoir seen in the right inguinal region. There is diffuse scrotal wall edema. There is a 6.3 x 5.5 by 7.2 cm low-attenuation collection surrounding the reservoir in the superior scrotal region with surrounding edema and sent peripheral hyperdensity or calcification of uncertain etiology. Other: There is a small fat containing left inguinal hernia. There is no ascites or free air. There is mild body wall edema. Musculoskeletal: Degenerative changes affect the spine. IMPRESSION: 1. Penile implant with reservoir in the right inguinal region. There is a 7.2 cm low-attenuation collection surrounding the reservoir in the superior scrotal region with surrounding edema and sent peripheral hyperdensity or calcification of uncertain etiology. Findings may represent hematoma, seroma or abscess. 2.  Suprapubic Foley catheter decompresses the bladder. Bladder wall thickening is not excluded. Correlate clinically for cystitis. 3. Mild nonspecific bilateral perinephric fat stranding is new from prior. Correlate clinically for infection. 4. Large amount of stool throughout the colon. 5. Small bilateral pleural effusions. 6. Stable infrarenal abdominal aortic aneurysm measuring 6.8 cm. Patient is status post aorto bi-iliac graft. No periaortic fluid or stranding. Aortic Atherosclerosis (ICD10-I70.0). Electronically Signed   By: Greig Pique M.D.   On: 06/22/2024 19:12   Portable chest 1 View Result Date: 06/21/2024 CLINICAL DATA:  Cough EXAM: PORTABLE CHEST 1 VIEW COMPARISON:  Chest x-ray 12/29/2023.  Chest CT 01/08/2024. FINDINGS: There some peripheral interstitial opacities in the lung bases as seen on prior CT. There is no focal lung infiltrate, pleural effusion or pneumothorax. Heart is mildly enlarged. Aorta is tortuous. No acute fractures are seen. IMPRESSION: 1. No active disease. 2. Mild cardiomegaly. Electronically Signed   By: Greig Pique M.D.   On: 06/21/2024 19:16   CT SOFT TISSUE NECK WO CONTRAST Result Date: 06/19/2024 CLINICAL DATA:  Chronic neck pain radiating to the right shoulder. EXAM: CT NECK WITHOUT CONTRAST TECHNIQUE: Multidetector CT imaging of the neck was performed following the standard protocol without intravenous contrast. RADIATION DOSE REDUCTION: This exam was performed according to the departmental dose-optimization program which includes automated exposure control, adjustment of the mA and/or kV according to patient size and/or use of iterative reconstruction technique. COMPARISON:  High-resolution chest CT 01/08/2024 FINDINGS: Pharynx and larynx: No definite mass or swelling is identified, however assessment is limited by noncontrast technique and mild-to-moderate motion artifact. Patent airway. No parapharyngeal or retropharyngeal fluid collection or inflammation. Salivary  glands: Unremarkable parotid glands. Limited assessment of the submandibular glands due to prominent motion artifact without gross abnormality identified. Thyroid : Unremarkable. Lymph nodes: No enlarged or suspicious lymph nodes in the neck. Vascular: Mild atherosclerotic calcification at the carotid bifurcations. Limited intracranial: Unremarkable. Visualized orbits: Unremarkable. Mastoids and visualized paranasal sinuses: Mild, chronic appearing inferior mastoid air cell opacification bilaterally. Small mucous retention cyst in the left maxillary sinus. Skeleton: Widespread cervical disc degeneration including severe disc space narrowing and degenerative endplate changes from C3-4 through C6-7. Asymmetric facet arthrosis with facet ankylosis on the right at C2-3 and on the left at C4-5. Widespread neural foraminal stenosis including severe right neural foraminal stenosis at C3-4, moderate right and severe left neural foraminal stenosis at C4-5, and  moderate bilateral neural foraminal stenosis at C5-6. At least mild spinal stenosis is suspected at C3-4, C5-6, and C6-7. Upper chest: Partially visualized chronic fibrotic lung disease, more fully evaluated on the prior high-resolution chest CT. 4.6 cm ascending aortic aneurysm and 5.3 cm aneurysm of the proximal descending thoracic aorta, unchanged from the prior chest CT. Aortic and coronary atherosclerosis. Other: None. IMPRESSION: 1. No acute abnormality identified on this motion degraded noncontrast neck CT. 2. Widespread cervical disc degeneration with multilevel neural foraminal stenosis as above. 3. 4.6 cm ascending aortic aneurysm and 5.3 cm proximal descending thoracic aortic aneurysm, unchanged from 01/08/2024. Recommend semi-annual imaging followup by CTA or MRA and referral to cardiothoracic surgery if not already obtained. This recommendation follows 2010 ACCF/AHA/AATS/ACR/ASA/SCA/SCAI/SIR/STS/SVM Guidelines for the Diagnosis and Management of Patients  With Thoracic Aortic Disease. Circulation. 2010; 121: [id number]-z630. Aortic aneurysm NOS (ICD10-I71.9) 4.  Aortic Atherosclerosis (ICD10-I70.0). Electronically Signed   By: Dasie Hamburg M.D.   On: 06/19/2024 08:45   IR CYSTOSTOMY TUBE PLACEMENT/BLADDER ASPIRATION Result Date: 06/11/2024 INDICATION: Urinary retention, BPH, outflow obstruction, Foley dependent EXAM: ULTRASOUND AND FLUOROSCOPIC 16 FRENCH SUPRAPUBIC CATHETER PLACEMENT (COUNCIL TIP CATHETER) COMPARISON:  05/24/2024 MEDICATIONS: 1% LIDOCAINE  LOCAL ANESTHESIA/SEDATION: Moderate (conscious) sedation was employed during this procedure. A total of Versed  1.5 mg and Fentanyl  75 mcg was administered intravenously by the radiology nurse. Total intra-service moderate Sedation Time: 7 minutes. The patient's level of consciousness and vital signs were monitored continuously by radiology nursing throughout the procedure under my direct supervision. CONTRAST:  10 cc omni 300-administered into the collecting system(s) FLUOROSCOPY: Radiation Exposure Index (as provided by the fluoroscopic device): 4.0 mGy Kerma COMPLICATIONS: None immediate. PROCEDURE: Informed written consent was obtained from the patient after a thorough discussion of the procedural risks, benefits and alternatives. All questions were addressed. Maximal Sterile Barrier Technique was utilized including caps, mask, sterile gowns, sterile gloves, sterile drape, hand hygiene and skin antiseptic. A timeout was performed prior to the initiation of the procedure. Previous imaging reviewed. Patient positioned supine. Bladder distended with saline through the Foley catheter. Under sterile conditions and local anesthesia, ultrasound percutaneous needle access performed of the bladder dome. Images obtained for documentation. There was return of clear urine. Amplatz guidewire inserted. Tract dilatation performed with a 8 x 100 [employer] balloon. While deflating the balloon, the 16 French balloon tip Council  catheter was advanced through the percutaneous tract into the bladder. Deflated balloon and guidewire removed. Foley balloon inflated with 10 cc saline and retracted against the anterior bladder wall. Contrast injection confirms position. Images obtained for documentation. Following the procedure the Foley catheter was removed. Sterile dressing applied. Gravity drainage bag connected. No immediate complication. Patient tolerated the procedure well. IMPRESSION: Successful ultrasound and fluoroscopic suprapubic catheter placement (16 French balloon tip Council catheter). Electronically Signed   By: CHRISTELLA.  Shick M.D.   On: 06/11/2024 11:02    Signed: Harlene RAYMOND Bowl, DO 07/08/2024, 3:04 PM

## 2024-07-29 DEATH — deceased

## 2024-08-20 ENCOUNTER — Other Ambulatory Visit (HOSPITAL_COMMUNITY)

## 2024-09-05 ENCOUNTER — Ambulatory Visit: Admitting: Hematology and Oncology

## 2024-09-05 ENCOUNTER — Other Ambulatory Visit

## 2024-11-11 ENCOUNTER — Ambulatory Visit (INDEPENDENT_AMBULATORY_CARE_PROVIDER_SITE_OTHER): Admitting: Otolaryngology
